# Patient Record
Sex: Female | Born: 1955 | Race: Black or African American | Hispanic: No | Smoking: Former smoker
Health system: Southern US, Community
[De-identification: ages and names within clinical notes are randomized; demographics above are authoritative.]

## PROBLEM LIST (undated history)

## (undated) DIAGNOSIS — E119 Type 2 diabetes mellitus without complications: Secondary | ICD-10-CM

## (undated) DIAGNOSIS — I420 Dilated cardiomyopathy: Secondary | ICD-10-CM

## (undated) DIAGNOSIS — H3552 Pigmentary retinal dystrophy: Secondary | ICD-10-CM

## (undated) DIAGNOSIS — E782 Mixed hyperlipidemia: Secondary | ICD-10-CM

## (undated) DIAGNOSIS — R51 Headache: Secondary | ICD-10-CM

## (undated) DIAGNOSIS — Z171 Estrogen receptor negative status [ER-]: Principal | ICD-10-CM

## (undated) DIAGNOSIS — J45909 Unspecified asthma, uncomplicated: Secondary | ICD-10-CM

## (undated) DIAGNOSIS — K8020 Calculus of gallbladder without cholecystitis without obstruction: Secondary | ICD-10-CM

## (undated) DIAGNOSIS — H209 Unspecified iridocyclitis: Secondary | ICD-10-CM

## (undated) DIAGNOSIS — Z9221 Personal history of antineoplastic chemotherapy: Secondary | ICD-10-CM

## (undated) DIAGNOSIS — R911 Solitary pulmonary nodule: Secondary | ICD-10-CM

## (undated) DIAGNOSIS — C50512 Malignant neoplasm of lower-outer quadrant of left female breast: Secondary | ICD-10-CM

## (undated) DIAGNOSIS — R519 Headache, unspecified: Secondary | ICD-10-CM

## (undated) DIAGNOSIS — H547 Unspecified visual loss: Secondary | ICD-10-CM

## (undated) DIAGNOSIS — I10 Essential (primary) hypertension: Secondary | ICD-10-CM

## (undated) DIAGNOSIS — I509 Heart failure, unspecified: Secondary | ICD-10-CM

## (undated) DIAGNOSIS — Z923 Personal history of irradiation: Secondary | ICD-10-CM

## (undated) DIAGNOSIS — I2510 Atherosclerotic heart disease of native coronary artery without angina pectoris: Secondary | ICD-10-CM

## (undated) DIAGNOSIS — D649 Anemia, unspecified: Secondary | ICD-10-CM

## (undated) HISTORY — DX: Anemia, unspecified: D64.9

## (undated) HISTORY — DX: Unspecified asthma, uncomplicated: J45.909

## (undated) HISTORY — PX: TRANSTHORACIC ECHOCARDIOGRAM: SHX275

## (undated) HISTORY — DX: Calculus of gallbladder without cholecystitis without obstruction: K80.20

## (undated) HISTORY — DX: Atherosclerotic heart disease of native coronary artery without angina pectoris: I25.10

## (undated) HISTORY — PX: NASAL TURBINATE REDUCTION: SHX2072

## (undated) HISTORY — DX: Malignant neoplasm of lower-outer quadrant of left female breast: C50.512

## (undated) HISTORY — DX: Heart failure, unspecified: I50.9

## (undated) HISTORY — DX: Mixed hyperlipidemia: E78.2

## (undated) HISTORY — DX: Estrogen receptor negative status (ER-): Z17.1

## (undated) HISTORY — DX: Solitary pulmonary nodule: R91.1

## (undated) HISTORY — DX: Dilated cardiomyopathy: I42.0

## (undated) MED FILL — Dexamethasone Sodium Phosphate Inj 100 MG/10ML: INTRAMUSCULAR | Qty: 1 | Status: AC

---

## 2005-08-12 HISTORY — PX: OTHER SURGICAL HISTORY: SHX169

## 2015-07-03 ENCOUNTER — Emergency Department (HOSPITAL_COMMUNITY)
Admission: EM | Admit: 2015-07-03 | Discharge: 2015-07-03 | Disposition: A | Discharge status: Home / Self Care | Payer: Medicare Other | Attending: Emergency Medicine | Admitting: Emergency Medicine

## 2015-07-03 ENCOUNTER — Encounter (HOSPITAL_COMMUNITY): Payer: Self-pay | Admitting: *Deleted

## 2015-07-03 DIAGNOSIS — Z79899 Other long term (current) drug therapy: Secondary | ICD-10-CM | POA: Insufficient documentation

## 2015-07-03 DIAGNOSIS — H540 Blindness, both eyes: Secondary | ICD-10-CM | POA: Diagnosis not present

## 2015-07-03 DIAGNOSIS — H2013 Chronic iridocyclitis, bilateral: Secondary | ICD-10-CM | POA: Insufficient documentation

## 2015-07-03 DIAGNOSIS — Z7982 Long term (current) use of aspirin: Secondary | ICD-10-CM | POA: Insufficient documentation

## 2015-07-03 DIAGNOSIS — I10 Essential (primary) hypertension: Secondary | ICD-10-CM | POA: Diagnosis not present

## 2015-07-03 DIAGNOSIS — Z791 Long term (current) use of non-steroidal anti-inflammatories (NSAID): Secondary | ICD-10-CM | POA: Insufficient documentation

## 2015-07-03 DIAGNOSIS — H578 Other specified disorders of eye and adnexa: Secondary | ICD-10-CM | POA: Diagnosis present

## 2015-07-03 HISTORY — DX: Essential (primary) hypertension: I10

## 2015-07-03 HISTORY — DX: Unspecified visual loss: H54.7

## 2015-07-03 HISTORY — DX: Unspecified iridocyclitis: H20.9

## 2015-07-03 HISTORY — DX: Pigmentary retinal dystrophy: H35.52

## 2015-07-03 MED ORDER — TETRACAINE HCL 0.5 % OP SOLN
2.0000 [drp] | Freq: Once | OPHTHALMIC | Status: AC
  Administered 2015-07-03: 2 [drp] via OPHTHALMIC
  Filled 2015-07-03: qty 2

## 2015-07-03 MED ORDER — FLUORESCEIN SODIUM 1 MG OP STRP
1.0000 | ORAL_STRIP | Freq: Once | OPHTHALMIC | Status: AC
  Administered 2015-07-03: 10:00:00 via OPHTHALMIC
  Filled 2015-07-03: qty 1

## 2015-07-03 NOTE — ED Provider Notes (Signed)
CSN: [id number]     Arrival date & time 07/03/15  [id number] History   First MD Initiated Contact with Patient 07/03/15 [phone]     Chief Complaint  Patient presents with  . Eye Problem     (Consider location/radiation/quality/duration/timing/severity/associated sxs/prior Treatment) Patient is a 59 y.o. female presenting with eye problem. The history is provided by the patient.  Eye Problem Location:  Both Quality:  Burning Severity:  Moderate Onset quality:  Gradual Duration: 1.5 weeks. Timing:  Constant Progression:  Unchanged Chronicity:  Recurrent Context: not contact lens problem, not foreign body and not scratch   Relieved by:  Nothing Worsened by:  Nothing tried Ineffective treatments:  None tried Associated symptoms: redness   Associated symptoms: no photophobia   Risk factors comment:  RP and history of uveitis   Past Medical History  Diagnosis Date  . Blind   . Retinitis pigmentosa   . Uveitis   . Hypertension    History reviewed. No pertinent past surgical history. History reviewed. No pertinent family history. Social History  Substance Use Topics  . Smoking status: Never Smoker   . Smokeless tobacco: None  . Alcohol Use: No   OB History    No data available     Review of Systems  Eyes: Positive for redness. Negative for photophobia.  All other systems reviewed and are negative.     Allergies  Tylenol with codeine #3  Home Medications   Prior to Admission medications   Medication Sig Start Date End Date Taking? Authorizing Provider  albuterol (PROVENTIL HFA;VENTOLIN HFA) 108 (90 BASE) MCG/ACT inhaler Inhale 1 puff into the lungs every 6 (six) hours as needed for wheezing or shortness of breath.   Yes Historical Provider, MD  aspirin EC 81 MG tablet Take 81 mg by mouth daily.   Yes Historical Provider, MD  ferrous sulfate 325 (65 FE) MG tablet Take 325 mg by mouth daily with breakfast.   Yes Historical Provider, MD  ibuprofen (ADVIL,MOTRIN) 200 MG  tablet Take 400 mg by mouth every 6 (six) hours as needed for moderate pain.   Yes Historical Provider, MD  naproxen (NAPROSYN) 375 MG tablet Take 375 mg by mouth 2 (two) times daily with a meal.   Yes Historical Provider, MD   BP 171/104 mmHg  Pulse 64  Temp(Src) 97.9 F (36.6 C) (Oral)  Resp 18  SpO2 98% Physical Exam  Constitutional: She is oriented to person, place, and time. She appears well-developed and well-nourished. No distress.  HENT:  Head: Normocephalic.  Eyes: Lids are normal. Pupils are equal, round, and reactive to light. Right eye exhibits no chemosis and no exudate. Right conjunctiva is injected (right worse than left). Left conjunctiva is injected.  Strabismus, left corneal clouding worse than right without fluorescein uptake in either side.   Neck: Neck supple. No tracheal deviation present.  Cardiovascular: Normal rate and regular rhythm.   Pulmonary/Chest: Effort normal. No respiratory distress.  Abdominal: Soft. She exhibits no distension.  Neurological: She is alert and oriented to person, place, and time.  Skin: Skin is warm and dry.  Psychiatric: She has a normal mood and affect.    ED Course  Procedures (including critical care time) Labs Review Labs Reviewed - No data to display  Imaging Review No results found. I have personally reviewed and evaluated these images and lab results as part of my medical decision-making.   EKG Interpretation None      MDM   Final diagnoses:  Chronic  anterior uveitis, bilateral    59 y.o. 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 No evidence of corneal abrasion. Has acute findings here that will require urgent exam by opthalmology. She is pending appointment with retina and eye center but has not gotten medicaid approval yet. I called the o/c MD for their office and they can see Pt for $100 deposit but Pt  unable to afford, recommending consult to ED o/c ophtho. ophtho consulted and can see Pt in clinic today. Pt able to go directly to their office and discharged with plan to se them or return if unable.    Leo Grosser, MD 07/05/15 [phone]

## 2015-07-03 NOTE — ED Notes (Signed)
Legally blind  Hx of uv itis-- retinitis pigmentosa x 8 yrs

## 2015-07-03 NOTE — ED Notes (Signed)
Gave pt water, per Santiago Glad - RN

## 2015-07-03 NOTE — Discharge Instructions (Signed)
Uveitis Uveitis is the swelling and irritation in the eye. Most of the time, it affects the middle part of the eye (uvea). There are different types uveitis:  Iritis. This type affects the colored part of the eye.  Intermediate uveitis. This type affects the middle of the eye.  Posterior uveitis. This type affects the back of the eye.  Panuveitis. This type affects all layers of the eye. HOME CARE  Take medicines only as told by your doctor. These include over-the-counter medicines and prescription medicines.  Follow instructions from your doctor about what activities are safe for you.  Do not use any tobacco products. These include cigarettes, chewing tobacco, and e-cigarettes. If you need help quitting, ask your doctor.  Keep all follow-up visits as told by your doctor. This is important. GET HELP IF:  Your medicines are not working. GET HELP RIGHT AWAY IF:  You have redness in one eye or both eyes.  Light bothers your eyes a lot.  You have pain in your eye.  You have aching in your eye.  You cannot see as much as you did before.   This information is not intended to replace advice given to you by your health care provider. Make sure you discuss any questions you have with your health care provider.   Document Released: 12/13/2014 Document Reviewed: 12/13/2014 Elsevier Interactive Patient Education [employer].

## 2015-07-03 NOTE — ED Notes (Signed)
Pt reports having uveititis and retinitis which has caused blindness to both eyes. Now has redness to right eye and pain/itching to both eyes.

## 2015-10-09 ENCOUNTER — Encounter (HOSPITAL_COMMUNITY): Payer: Self-pay | Admitting: Emergency Medicine

## 2015-10-09 ENCOUNTER — Emergency Department (HOSPITAL_COMMUNITY): Payer: Medicare Other

## 2015-10-09 ENCOUNTER — Emergency Department (HOSPITAL_COMMUNITY)
Admission: EM | Admit: 2015-10-09 | Discharge: 2015-10-09 | Disposition: A | Discharge status: Home / Self Care | Payer: Medicare Other | Attending: Emergency Medicine | Admitting: Emergency Medicine

## 2015-10-09 DIAGNOSIS — Z79899 Other long term (current) drug therapy: Secondary | ICD-10-CM | POA: Diagnosis not present

## 2015-10-09 DIAGNOSIS — R05 Cough: Secondary | ICD-10-CM

## 2015-10-09 DIAGNOSIS — R062 Wheezing: Secondary | ICD-10-CM

## 2015-10-09 DIAGNOSIS — J159 Unspecified bacterial pneumonia: Secondary | ICD-10-CM | POA: Diagnosis not present

## 2015-10-09 DIAGNOSIS — H540 Blindness, both eyes: Secondary | ICD-10-CM | POA: Diagnosis not present

## 2015-10-09 DIAGNOSIS — I10 Essential (primary) hypertension: Secondary | ICD-10-CM | POA: Diagnosis not present

## 2015-10-09 DIAGNOSIS — R059 Cough, unspecified: Secondary | ICD-10-CM

## 2015-10-09 DIAGNOSIS — Z791 Long term (current) use of non-steroidal anti-inflammatories (NSAID): Secondary | ICD-10-CM | POA: Diagnosis not present

## 2015-10-09 DIAGNOSIS — Z7982 Long term (current) use of aspirin: Secondary | ICD-10-CM | POA: Diagnosis not present

## 2015-10-09 DIAGNOSIS — J189 Pneumonia, unspecified organism: Secondary | ICD-10-CM

## 2015-10-09 MED ORDER — HYDROCHLOROTHIAZIDE 12.5 MG PO TABS: 12.5000 mg | ORAL_TABLET | Freq: Every day | ORAL | Status: DC

## 2015-10-09 MED ORDER — ALBUTEROL SULFATE HFA 108 (90 BASE) MCG/ACT IN AERS: 2.0000 | INHALATION_SPRAY | RESPIRATORY_TRACT | Status: DC | PRN

## 2015-10-09 MED ORDER — ALBUTEROL SULFATE (2.5 MG/3ML) 0.083% IN NEBU
5.0000 mg | INHALATION_SOLUTION | Freq: Once | RESPIRATORY_TRACT | Status: AC
  Administered 2015-10-09: 5 mg via RESPIRATORY_TRACT
  Filled 2015-10-09: qty 6

## 2015-10-09 MED ORDER — AMLODIPINE BESYLATE 5 MG PO TABS
5.0000 mg | ORAL_TABLET | Freq: Once | ORAL | Status: AC
  Administered 2015-10-09: 5 mg via ORAL
  Filled 2015-10-09: qty 1

## 2015-10-09 MED ORDER — DM-GUAIFENESIN ER 30-600 MG PO TB12
1.0000 | ORAL_TABLET | Freq: Two times a day (BID) | ORAL | Status: DC
  Administered 2015-10-09: 1 via ORAL
  Filled 2015-10-09: qty 1

## 2015-10-09 MED ORDER — DOXYCYCLINE HYCLATE 100 MG PO CAPS: 100.0000 mg | ORAL_CAPSULE | Freq: Two times a day (BID) | ORAL | Status: DC

## 2015-10-09 MED ORDER — AMLODIPINE BESYLATE 5 MG PO TABS: 5.0000 mg | ORAL_TABLET | Freq: Every day | ORAL | Status: DC

## 2015-10-09 MED ORDER — DOXYCYCLINE HYCLATE 100 MG PO TABS
100.0000 mg | ORAL_TABLET | Freq: Once | ORAL | Status: AC
  Administered 2015-10-09: 100 mg via ORAL
  Filled 2015-10-09: qty 1

## 2015-10-09 MED ORDER — IPRATROPIUM BROMIDE 0.02 % IN SOLN
0.5000 mg | Freq: Once | RESPIRATORY_TRACT | Status: AC
  Administered 2015-10-09: 0.5 mg via RESPIRATORY_TRACT
  Filled 2015-10-09: qty 2.5

## 2015-10-09 NOTE — ED Notes (Signed)
Patient left at this time with all belongings. 

## 2015-10-09 NOTE — ED Notes (Signed)
Pt sts cough and sore throat x 1 week; pt denies fever; pt sts some nosebleeding; pt sts some HA

## 2015-10-09 NOTE — ED Provider Notes (Addendum)
CSN: [id number]     Arrival date & time 10/09/15  [id number] History   First MD Initiated Contact with Patient 10/09/15 1909     Chief Complaint  Patient presents with  . Cough     (Consider location/radiation/quality/duration/timing/severity/associated sxs/prior Treatment) Patient is a 60 y.o. female presenting with cough. The history is provided by the patient.  Cough Associated symptoms: shortness of breath   Associated symptoms: no chest pain, no chills, no headaches, no rash and no sore throat   Patient c/o increased productive cough in the past 1-2 weeks. States stopped smoking 2 weeks ago.  Denies sore throat or runny nose.  States sob w coughing spells. subj fever. No chills. Denies chest pain or discomfort.  No leg pain or swelling. No recent surgery, immobility, trauma or travel.  No hx dvt or pe. No exertional cp or discomfort. No unusual fatigue. No fam hx premature cad. Denies orthopnea or pnd. No wt change.     Past Medical History  Diagnosis Date  . Blind   . Retinitis pigmentosa   . Uveitis   . Hypertension    History reviewed. No pertinent past surgical history. History reviewed. No pertinent family history. Social History  Substance Use Topics  . Smoking status: Never Smoker   . Smokeless tobacco: None  . Alcohol Use: No   OB History    No data available     Review of Systems  Constitutional: Negative for chills.  HENT: Negative for sore throat.   Eyes: Negative for redness.  Respiratory: Positive for cough and shortness of breath.   Cardiovascular: Negative for chest pain and leg swelling.  Gastrointestinal: Negative for vomiting, abdominal pain and diarrhea.  Genitourinary: Negative for dysuria and flank pain.  Musculoskeletal: Negative for back pain and neck pain.  Skin: Negative for rash.  Neurological: Negative for headaches.  Hematological: Does not bruise/bleed easily.  Psychiatric/Behavioral: Negative for confusion.      Allergies  Tylenol  with codeine #3  Home Medications   Prior to Admission medications   Medication Sig Start Date End Date Taking? Authorizing Provider  albuterol (PROVENTIL HFA;VENTOLIN HFA) 108 (90 BASE) MCG/ACT inhaler Inhale 1 puff into the lungs every 6 (six) hours as needed for wheezing or shortness of breath.    Historical Provider, MD  aspirin EC 81 MG tablet Take 81 mg by mouth daily.    Historical Provider, MD  ferrous sulfate 325 (65 FE) MG tablet Take 325 mg by mouth daily with breakfast.    Historical Provider, MD  ibuprofen (ADVIL,MOTRIN) 200 MG tablet Take 400 mg by mouth every 6 (six) hours as needed for moderate pain.    Historical Provider, MD  naproxen (NAPROSYN) 375 MG tablet Take 375 mg by mouth 2 (two) times daily with a meal.    Historical Provider, MD   BP 154/116 mmHg  Pulse 101  Temp(Src) 97.8 F (36.6 C) (Oral)  Resp 19  SpO2 98% Physical Exam  Constitutional: She is oriented to person, place, and time. She appears well-developed and well-nourished. No distress.  HENT:  Mouth/Throat: Oropharynx is clear and moist.  Eyes: Conjunctivae are normal. No scleral icterus.  Neck: Normal range of motion. Neck supple. No tracheal deviation present. No thyromegaly present.  Cardiovascular: Normal rate, regular rhythm, normal heart sounds and intact distal pulses.  Exam reveals no gallop and no friction rub.   No murmur heard. Pulmonary/Chest: Effort normal. No respiratory distress. She has wheezes.  Abdominal: Normal appearance and bowel sounds  are normal. She exhibits no distension. There is no tenderness.  Musculoskeletal: She exhibits no edema or tenderness.  Lymphadenopathy:    She has no cervical adenopathy.  Neurological: She is alert and oriented to person, place, and time.  Skin: Skin is warm and dry. No rash noted. She is not diaphoretic.  Psychiatric: She has a normal mood and affect.  Nursing note and vitals reviewed.   ED Course  Procedures (including critical care  time)  Imaging Review Dg Chest 2 View  10/09/2015  CLINICAL DATA:  One week history of cough. EXAM: CHEST  2 VIEW COMPARISON:  None. FINDINGS: The heart is enlarged. Mild tortuosity of the thoracic aorta. There are bronchitic type interstitial lung changes and peribronchial thickening which could suggest bronchitis or interstitial pneumonitis. No focal airspace consolidation or pleural effusion. The bony thorax is intact. IMPRESSION: Diffuse bronchitic changes suggesting bronchitis or interstitial pneumonitis. No focal infiltrate or effusion. Electronically Signed   By: Marijo Sanes M.D.   On: 10/09/2015 16:21   I have personally reviewed and evaluated these images as part of my medical decision-making.    MDM   Cxr.  Reviewed nursing notes and prior charts for additional history.   V mild wheezing, resolved w neb.   Productive cough, congestion on exam.  Will rx for possible cap.   Doxycycline po.  rx for home.  rec close pcp f/u re current symptoms, as well as for recheck bp.  On review of prior records, prior bp also high, will start rx tonight.        Lajean Saver, MD 10/09/15 2020

## 2015-10-09 NOTE — Discharge Instructions (Signed)
It was our pleasure to provide your ER care today - we hope that you feel better.  Great work on stopping smoking - keep it up.  Take doxycycline (antibiotic) as prescribed.  Use albuterol inhaler as need.  Take mucinex as need for cough (available over the counter).   Follow up with primary care doctor in the next 3-4 days for recheck.  Also have your blood pressure rechecked then as it is high today.   For blood pressure, take amlodipine and hctz as prescribed.   Return to ER if worse, increased difficulty breathing, chest pain, weak/fainting, other concern.       Cough, Adult Coughing is a reflex that clears your throat and your airways. Coughing helps to heal and protect your lungs. It is normal to cough occasionally, but a cough that happens with other symptoms or lasts a long time may be a sign of a condition that needs treatment. A cough may last only 2-3 weeks (acute), or it may last longer than 8 weeks (chronic). CAUSES Coughing is commonly caused by:  Breathing in substances that irritate your lungs.  A viral or bacterial respiratory infection.  Allergies.  Asthma.  Postnasal drip.  Smoking.  Acid backing up from the stomach into the esophagus (gastroesophageal reflux).  Certain medicines.  Chronic lung problems, including COPD (or rarely, lung cancer).  Other medical conditions such as heart failure. HOME CARE INSTRUCTIONS  Pay attention to any changes in your symptoms. Take these actions to help with your discomfort:  Take medicines only as told by your health care provider.  If you were prescribed an antibiotic medicine, take it as told by your health care provider. Do not stop taking the antibiotic even if you start to feel better.  Talk with your health care provider before you take a cough suppressant medicine.  Drink enough fluid to keep your urine clear or pale yellow.  If the air is dry, use a cold steam vaporizer or humidifier in your  bedroom or your home to help loosen secretions.  Avoid anything that causes you to cough at work or at home.  If your cough is worse at night, try sleeping in a semi-upright position.  Avoid cigarette smoke. If you smoke, quit smoking. If you need help quitting, ask your health care provider.  Avoid caffeine.  Avoid alcohol.  Rest as needed. SEEK MEDICAL CARE IF:   You have new symptoms.  You cough up pus.  Your cough does not get better after 2-3 weeks, or your cough gets worse.  You cannot control your cough with suppressant medicines and you are losing sleep.  You develop pain that is getting worse or pain that is not controlled with pain medicines.  You have a fever.  You have unexplained weight loss.  You have night sweats. SEEK IMMEDIATE MEDICAL CARE IF:  You cough up blood.  You have difficulty breathing.  Your heartbeat is very fast.   This information is not intended to replace advice given to you by your health care provider. Make sure you discuss any questions you have with your health care provider.   Document Released: 01/25/2011 Document Revised: 04/19/2015 Document Reviewed: 10/05/2014 Elsevier Interactive Patient Education 2016 [employer].  Hypertension Hypertension, commonly called high blood pressure, is when the force of blood pumping through your arteries is too strong. Your arteries are the blood vessels that carry blood from your heart throughout your body. A blood pressure reading consists of a higher number over  a lower number, such as 110/72. The higher number (systolic) is the pressure inside your arteries when your heart pumps. The lower number (diastolic) is the pressure inside your arteries when your heart relaxes. Ideally you want your blood pressure below 120/80. Hypertension forces your heart to work harder to pump blood. Your arteries may become narrow or stiff. Having untreated or uncontrolled hypertension can cause heart attack,  stroke, kidney disease, and other problems. RISK FACTORS Some risk factors for high blood pressure are controllable. Others are not.  Risk factors you cannot control include:   Race. You may be at higher risk if you are African American.  Age. Risk increases with age.  Gender. Men are at higher risk than women before age 10 years. After age 102, women are at higher risk than men. Risk factors you can control include:  Not getting enough exercise or physical activity.  Being overweight.  Getting too much fat, sugar, calories, or salt in your diet.  Drinking too much alcohol. SIGNS AND SYMPTOMS Hypertension does not usually cause signs or symptoms. Extremely high blood pressure (hypertensive crisis) may cause headache, anxiety, shortness of breath, and nosebleed. DIAGNOSIS To check if you have hypertension, your health care provider will measure your blood pressure while you are seated, with your arm held at the level of your heart. It should be measured at least twice using the same arm. Certain conditions can cause a difference in blood pressure between your right and left arms. A blood pressure reading that is higher than normal on one occasion does not mean that you need treatment. If it is not clear whether you have high blood pressure, you may be asked to return on a different day to have your blood pressure checked again. Or, you may be asked to monitor your blood pressure at home for 1 or more weeks. TREATMENT Treating high blood pressure includes making lifestyle changes and possibly taking medicine. Living a healthy lifestyle can help lower high blood pressure. You may need to change some of your habits. Lifestyle changes may include:  Following the DASH diet. This diet is high in fruits, vegetables, and whole grains. It is low in salt, red meat, and added sugars.  Keep your sodium intake below 2,300 mg per day.  Getting at least 30-45 minutes of aerobic exercise at least 4  times per week.  Losing weight if necessary.  Not smoking.  Limiting alcoholic beverages.  Learning ways to reduce stress. Your health care provider may prescribe medicine if lifestyle changes are not enough to get your blood pressure under control, and if one of the following is true:  You are 28-19 years of age and your systolic blood pressure is above 140.  You are 37 years of age or older, and your systolic blood pressure is above 150.  Your diastolic blood pressure is above 90.  You have diabetes, and your systolic blood pressure is over [id number] or your diastolic blood pressure is over 90.  You have kidney disease and your blood pressure is above 140/90.  You have heart disease and your blood pressure is above 140/90. Your personal target blood pressure may vary depending on your medical conditions, your age, and other factors. HOME CARE INSTRUCTIONS  Have your blood pressure rechecked as directed by your health care provider.   Take medicines only as directed by your health care provider. Follow the directions carefully. Blood pressure medicines must be taken as prescribed. The medicine does not work as well  when you skip doses. Skipping doses also puts you at risk for problems.  Do not smoke.   Monitor your blood pressure at home as directed by your health care provider. SEEK MEDICAL CARE IF:   You think you are having a reaction to medicines taken.  You have recurrent headaches or feel dizzy.  You have swelling in your ankles.  You have trouble with your vision. SEEK IMMEDIATE MEDICAL CARE IF:  You develop a severe headache or confusion.  You have unusual weakness, numbness, or feel faint.  You have severe chest or abdominal pain.  You vomit repeatedly.  You have trouble breathing. MAKE SURE YOU:   Understand these instructions.  Will watch your condition.  Will get help right away if you are not doing well or get worse.   This information is not  intended to replace advice given to you by your health care provider. Make sure you discuss any questions you have with your health care provider.   Document Released: 07/29/2005 Document Revised: 12/13/2014 Document Reviewed: 05/21/2013 Elsevier Interactive Patient Education [employer].

## 2015-12-11 DIAGNOSIS — I420 Dilated cardiomyopathy: Secondary | ICD-10-CM

## 2015-12-11 HISTORY — PX: TRANSTHORACIC ECHOCARDIOGRAM: SHX275

## 2015-12-11 HISTORY — DX: Dilated cardiomyopathy: I42.0

## 2015-12-27 DIAGNOSIS — I420 Dilated cardiomyopathy: Secondary | ICD-10-CM | POA: Insufficient documentation

## 2015-12-28 DIAGNOSIS — I5042 Chronic combined systolic (congestive) and diastolic (congestive) heart failure: Secondary | ICD-10-CM | POA: Insufficient documentation

## 2016-01-04 LAB — HM HIV SCREENING LAB: HM HIV Screening: NEGATIVE

## 2016-01-11 DIAGNOSIS — R112 Nausea with vomiting, unspecified: Secondary | ICD-10-CM | POA: Diagnosis not present

## 2016-01-11 DIAGNOSIS — I10 Essential (primary) hypertension: Secondary | ICD-10-CM | POA: Diagnosis not present

## 2016-01-11 DIAGNOSIS — I5031 Acute diastolic (congestive) heart failure: Secondary | ICD-10-CM | POA: Diagnosis not present

## 2016-01-25 DIAGNOSIS — I10 Essential (primary) hypertension: Secondary | ICD-10-CM | POA: Diagnosis not present

## 2016-01-25 DIAGNOSIS — I509 Heart failure, unspecified: Secondary | ICD-10-CM | POA: Diagnosis not present

## 2016-02-14 DIAGNOSIS — I509 Heart failure, unspecified: Secondary | ICD-10-CM | POA: Diagnosis not present

## 2016-02-14 DIAGNOSIS — I10 Essential (primary) hypertension: Secondary | ICD-10-CM | POA: Diagnosis not present

## 2016-02-19 ENCOUNTER — Other Ambulatory Visit: Payer: Self-pay | Admitting: Family Medicine

## 2016-02-19 DIAGNOSIS — Z1231 Encounter for screening mammogram for malignant neoplasm of breast: Secondary | ICD-10-CM

## 2016-02-22 ENCOUNTER — Ambulatory Visit
Admission: RE | Admit: 2016-02-22 | Discharge: 2016-02-22 | Disposition: A | Discharge status: Home / Self Care | Payer: Commercial Managed Care - HMO | Source: Ambulatory Visit | Attending: Family Medicine | Admitting: Family Medicine

## 2016-02-22 DIAGNOSIS — Z1231 Encounter for screening mammogram for malignant neoplasm of breast: Secondary | ICD-10-CM

## 2016-02-26 ENCOUNTER — Other Ambulatory Visit: Payer: Self-pay | Admitting: Family Medicine

## 2016-02-26 DIAGNOSIS — R928 Other abnormal and inconclusive findings on diagnostic imaging of breast: Secondary | ICD-10-CM

## 2016-03-01 ENCOUNTER — Other Ambulatory Visit: Payer: Self-pay

## 2016-03-01 ENCOUNTER — Ambulatory Visit
Admission: RE | Admit: 2016-03-01 | Discharge: 2016-03-01 | Disposition: A | Discharge status: Home / Self Care | Payer: Commercial Managed Care - HMO | Source: Ambulatory Visit | Attending: Family Medicine | Admitting: Family Medicine

## 2016-03-01 DIAGNOSIS — R928 Other abnormal and inconclusive findings on diagnostic imaging of breast: Secondary | ICD-10-CM

## 2016-03-15 DIAGNOSIS — R911 Solitary pulmonary nodule: Secondary | ICD-10-CM

## 2016-03-15 DIAGNOSIS — R9439 Abnormal result of other cardiovascular function study: Secondary | ICD-10-CM | POA: Insufficient documentation

## 2016-03-15 HISTORY — DX: Solitary pulmonary nodule: R91.1

## 2016-03-16 DIAGNOSIS — R911 Solitary pulmonary nodule: Secondary | ICD-10-CM | POA: Insufficient documentation

## 2016-03-16 LAB — HM HEPATITIS C SCREENING LAB: HM Hepatitis Screen: NEGATIVE

## 2016-08-27 DIAGNOSIS — I10 Essential (primary) hypertension: Secondary | ICD-10-CM | POA: Diagnosis not present

## 2016-08-27 DIAGNOSIS — J209 Acute bronchitis, unspecified: Secondary | ICD-10-CM | POA: Diagnosis not present

## 2016-08-27 DIAGNOSIS — I509 Heart failure, unspecified: Secondary | ICD-10-CM | POA: Diagnosis not present

## 2016-10-28 DIAGNOSIS — M79644 Pain in right finger(s): Secondary | ICD-10-CM | POA: Diagnosis not present

## 2016-10-28 DIAGNOSIS — I509 Heart failure, unspecified: Secondary | ICD-10-CM | POA: Diagnosis not present

## 2016-10-28 DIAGNOSIS — I10 Essential (primary) hypertension: Secondary | ICD-10-CM | POA: Diagnosis not present

## 2016-11-19 DIAGNOSIS — Z9181 History of falling: Secondary | ICD-10-CM | POA: Diagnosis not present

## 2016-11-19 DIAGNOSIS — I10 Essential (primary) hypertension: Secondary | ICD-10-CM | POA: Diagnosis not present

## 2016-11-19 DIAGNOSIS — E669 Obesity, unspecified: Secondary | ICD-10-CM | POA: Diagnosis not present

## 2016-11-19 DIAGNOSIS — Z0000 Encounter for general adult medical examination without abnormal findings: Secondary | ICD-10-CM | POA: Diagnosis not present

## 2016-11-19 DIAGNOSIS — Z6837 Body mass index (BMI) 37.0-37.9, adult: Secondary | ICD-10-CM | POA: Diagnosis not present

## 2016-11-19 DIAGNOSIS — R110 Nausea: Secondary | ICD-10-CM | POA: Diagnosis not present

## 2016-11-19 DIAGNOSIS — Z79899 Other long term (current) drug therapy: Secondary | ICD-10-CM | POA: Diagnosis not present

## 2016-11-19 DIAGNOSIS — R600 Localized edema: Secondary | ICD-10-CM | POA: Diagnosis not present

## 2016-11-19 DIAGNOSIS — Z87891 Personal history of nicotine dependence: Secondary | ICD-10-CM | POA: Diagnosis not present

## 2017-03-20 ENCOUNTER — Encounter: Payer: Self-pay | Admitting: Gastroenterology

## 2017-03-20 ENCOUNTER — Encounter: Payer: Self-pay | Admitting: Family Medicine

## 2017-03-20 ENCOUNTER — Ambulatory Visit (INDEPENDENT_AMBULATORY_CARE_PROVIDER_SITE_OTHER): Payer: Medicare HMO | Admitting: Family Medicine

## 2017-03-20 ENCOUNTER — Other Ambulatory Visit: Payer: Self-pay | Admitting: Family Medicine

## 2017-03-20 VITALS — BP 132/90 | HR 75 | Ht 67.25 in | Wt 237.6 lb

## 2017-03-20 DIAGNOSIS — H3552 Pigmentary retinal dystrophy: Secondary | ICD-10-CM

## 2017-03-20 DIAGNOSIS — I10 Essential (primary) hypertension: Secondary | ICD-10-CM

## 2017-03-20 DIAGNOSIS — Z136 Encounter for screening for cardiovascular disorders: Secondary | ICD-10-CM | POA: Diagnosis not present

## 2017-03-20 DIAGNOSIS — I509 Heart failure, unspecified: Secondary | ICD-10-CM | POA: Diagnosis not present

## 2017-03-20 DIAGNOSIS — Z800 Family history of malignant neoplasm of digestive organs: Secondary | ICD-10-CM | POA: Diagnosis not present

## 2017-03-20 DIAGNOSIS — Z1211 Encounter for screening for malignant neoplasm of colon: Secondary | ICD-10-CM | POA: Diagnosis not present

## 2017-03-20 DIAGNOSIS — Z7689 Persons encountering health services in other specified circumstances: Secondary | ICD-10-CM | POA: Diagnosis not present

## 2017-03-20 DIAGNOSIS — H547 Unspecified visual loss: Secondary | ICD-10-CM

## 2017-03-20 LAB — COMPLETE METABOLIC PANEL WITH GFR
ALBUMIN: 4.3 g/dL (ref 3.6–5.1)
ALT: 40 U/L — AB (ref 6–29)
AST: 24 U/L (ref 10–35)
Alkaline Phosphatase: 109 U/L (ref 33–130)
BUN: 13 mg/dL (ref 7–25)
CHLORIDE: 108 mmol/L (ref 98–110)
CO2: 22 mmol/L (ref 20–32)
CREATININE: 0.78 mg/dL (ref 0.50–1.05)
Calcium: 9.4 mg/dL (ref 8.6–10.4)
GFR, Est African American: >89 mL/min (ref >=60)
GFR, Est Non African American: 84 mL/min (ref >=60)
GLUCOSE: 99 mg/dL (ref 65–99)
POTASSIUM: 4.2 mmol/L (ref 3.5–5.3)
SODIUM: 141 mmol/L (ref 135–146)
Total Bilirubin: 0.4 mg/dL (ref 0.2–1.2)
Total Protein: 7.3 g/dL (ref 6.1–8.1)

## 2017-03-20 LAB — CBC WITH DIFFERENTIAL/PLATELET
BASOS PCT: 0 %
Basophils Absolute: 0 cells/uL (ref 0–200)
EOS PCT: 3 %
Eosinophils Absolute: 225 cells/uL (ref 15–500)
HCT: 37.9 % (ref 35.0–45.0)
HEMOGLOBIN: 12.7 g/dL (ref 11.7–15.5)
LYMPHS ABS: 2400 {cells}/uL (ref 850–3900)
Lymphocytes Relative: 32 %
MCH: 30 pg (ref 27.0–33.0)
MCHC: 33.5 g/dL (ref 32.0–36.0)
MCV: 89.6 fL (ref 80.0–100.0)
MPV: 9.5 fL (ref 7.5–12.5)
Monocytes Absolute: 450 cells/uL (ref 200–950)
Monocytes Relative: 6 %
NEUTROS PCT: 59 %
Neutro Abs: 4425 cells/uL (ref 1500–7800)
Platelets: 362 10*3/uL (ref 140–400)
RBC: 4.23 MIL/uL (ref 3.80–5.10)
RDW: 14 % (ref 11.0–15.0)
WBC: 7.5 10*3/uL (ref 4.0–10.5)

## 2017-03-20 LAB — LIPID PANEL
Cholesterol: 195 mg/dL (ref <200)
HDL: 46 mg/dL — AB (ref >50)
LDL CALC: 128 mg/dL — AB (ref <100)
TRIGLYCERIDES: 104 mg/dL (ref <150)
Total CHOL/HDL Ratio: 4.2 Ratio (ref <5.0)
VLDL: 21 mg/dL (ref <30)

## 2017-03-20 NOTE — Patient Instructions (Signed)
I need your medical records then we can get you back for an medicare annual wellness visit. Please call us in 2-4 weeks and see if we have received the records.   We will call you with lab results.   The cardiologist office and GI offices will call you to schedule an appointment.   The retina specialist office has scheduled an appointment for you. Please call them if you need to reschedule the appointment.

## 2017-03-20 NOTE — Progress Notes (Signed)
Subjective:    Patient ID: Kristin Ward, female    DOB: 04-Nov-1955, 61 y.o.   MRN: [id number]  HPI Chief Complaint  Patient presents with  . new pt    new pt fasting cpe. no vision exam done- pt had vision problems seeing, pap smear done 5 years ago   She is new to the practice and here today to establish care. Her daughter is with her.  She is requesting a CPE today but we will need to do an establish care visit today get her medical history since I have no medical records from her previous providers and she has a complex medical history. She is ok with this plan.  States she moved here 2 years ago from [place].  Diagnosed in 2009 with retinitis pigmentosa in [place]. She is legally blind. States she has also been told she has glaucoma and cataracts but is not sure if this was diagnosed officially.   Denies history of diabetes.  HTN: diagnosed in 2009. Checks her BP at home and states her diastolic number is usually 90 or higher. She does not have her medications with her today.   States she was diagnosed with CHF in 2016 in [place]. States she has been seen at [facility] here in town but due to missed visits and financial issues she cannot go back. States she needs a new cardiologist.    States Dr. Ayesha Rumpf has been her PCP at [facility] practice.   There is no immunization history on file for this patient.  Last mammogram: July 2017 Last colonoscopy: 2006  Lives with daughter and son. She worked as an Therapist, sports for years.    Reviewed allergies, medications, past medical, surgical, family, and social history.    Review of Systems Review of Systems Constitutional: -fever, -chills, -sweats, -unexpected weight change,-fatigue Cardiology:  -chest pain, -palpitations, -edema Respiratory: -cough, -shortness of breath, -wheezing Gastroenterology: -abdominal pain, -nausea, -vomiting, -diarrhea, -constipation  Ophthalmology: -vision changes Urology: -dysuria, -difficulty urinating,  -hematuria, -urinary frequency, -urgency Neurology: -headache, -weakness, -tingling, -numbness       Objective:   Physical Exam BP 132/90   Pulse 75   Ht 5' 7.25" (1.708 m)   Wt 237 lb 9.6 oz (107.8 kg)   BMI 36.94 kg/m   Alert and in no distress.  Pharyngeal area is normal. Neck is supple without adenopathy or thyromegaly. Cardiac exam shows a regular rhythm without murmurs or gallops. Lungs are clear to auscultation. Extremities without edema.       Assessment & Plan:  Hypertension, unspecified type - Plan: CBC with Differential/Platelet, COMPLETE METABOLIC PANEL WITH GFR, Lipid panel, Ambulatory referral to Cardiology  Chronic congestive heart failure, unspecified heart failure type ([place]) - Plan: CBC with Differential/Platelet, COMPLETE METABOLIC PANEL WITH GFR, Lipid panel, Ambulatory referral to Cardiology  Encounter to establish care  Screen for colon cancer - Plan: Ambulatory referral to Gastroenterology  Retinitis pigmentosa  Blind  Family history of colon cancer in mother - Plan: Ambulatory referral to Gastroenterology  Screening for ischemic heart disease - Plan: Lipid panel  Discussed that she has a complex medical history and I will need medical records from previous providers. She is not in any distress today and appears well.  She will continue on current medications for now. Continue checking BP at home.  Discussed healthy lifestyle modifications.  Discussed health maintenance and she is deficient in some areas.  Referral made to retinal specialist per patient request.  Referral made to cardiology.  Last colonoscopy was 2006 per patient and normal. Mother died from colon cancer per patient. GI referral made.  She is fasting and will check labs today including lipids.  Follow up pending labs.  Once I have received medical records we will get her back for an AWV and to further discuss chronic health conditions.  Spent a minimum of 45 minutes face to face with  patient and at least 50% was in counseling and coordination of care.

## 2017-03-21 ENCOUNTER — Telehealth: Payer: Self-pay

## 2017-03-21 NOTE — Telephone Encounter (Signed)
Records rcvd from [facility] in your folder for review. Kristin Ward

## 2017-03-22 LAB — HEPATITIS PANEL, ACUTE
HCV Ab: NONREACTIVE
HEP A IGM: NONREACTIVE
HEP B C IGM: NONREACTIVE
HEP B S AG: NONREACTIVE

## 2017-03-25 ENCOUNTER — Encounter: Payer: Self-pay | Admitting: Family Medicine

## 2017-03-25 DIAGNOSIS — J449 Chronic obstructive pulmonary disease, unspecified: Secondary | ICD-10-CM | POA: Insufficient documentation

## 2017-03-26 ENCOUNTER — Encounter: Payer: Self-pay | Admitting: Internal Medicine

## 2017-04-02 ENCOUNTER — Telehealth: Payer: Self-pay

## 2017-04-02 NOTE — Telephone Encounter (Signed)
Records rcvd from [facility]. Placed in your folder for review. Kristin Ward

## 2017-04-07 ENCOUNTER — Telehealth: Payer: Self-pay | Admitting: Internal Medicine

## 2017-04-07 ENCOUNTER — Ambulatory Visit (INDEPENDENT_AMBULATORY_CARE_PROVIDER_SITE_OTHER): Payer: Medicare HMO | Admitting: Family Medicine

## 2017-04-07 ENCOUNTER — Encounter: Payer: Self-pay | Admitting: Family Medicine

## 2017-04-07 ENCOUNTER — Other Ambulatory Visit (HOSPITAL_COMMUNITY)
Admission: RE | Admit: 2017-04-07 | Discharge: 2017-04-07 | Disposition: A | Discharge status: Home / Self Care | Payer: Self-pay | Source: Ambulatory Visit | Attending: Family Medicine | Admitting: Family Medicine

## 2017-04-07 VITALS — BP 122/80 | HR 60 | Ht 68.5 in | Wt 239.6 lb

## 2017-04-07 DIAGNOSIS — E2839 Other primary ovarian failure: Secondary | ICD-10-CM | POA: Diagnosis not present

## 2017-04-07 DIAGNOSIS — Z1231 Encounter for screening mammogram for malignant neoplasm of breast: Secondary | ICD-10-CM

## 2017-04-07 DIAGNOSIS — Z124 Encounter for screening for malignant neoplasm of cervix: Secondary | ICD-10-CM | POA: Insufficient documentation

## 2017-04-07 DIAGNOSIS — Z0000 Encounter for general adult medical examination without abnormal findings: Secondary | ICD-10-CM

## 2017-04-07 DIAGNOSIS — Z7189 Other specified counseling: Secondary | ICD-10-CM | POA: Diagnosis not present

## 2017-04-07 DIAGNOSIS — R911 Solitary pulmonary nodule: Secondary | ICD-10-CM | POA: Diagnosis not present

## 2017-04-07 DIAGNOSIS — R87612 Low grade squamous intraepithelial lesion on cytologic smear of cervix (LGSIL): Secondary | ICD-10-CM | POA: Insufficient documentation

## 2017-04-07 DIAGNOSIS — Z1239 Encounter for other screening for malignant neoplasm of breast: Secondary | ICD-10-CM

## 2017-04-07 DIAGNOSIS — I10 Essential (primary) hypertension: Secondary | ICD-10-CM

## 2017-04-07 DIAGNOSIS — H547 Unspecified visual loss: Secondary | ICD-10-CM | POA: Diagnosis not present

## 2017-04-07 DIAGNOSIS — Z01419 Encounter for gynecological examination (general) (routine) without abnormal findings: Secondary | ICD-10-CM | POA: Diagnosis not present

## 2017-04-07 DIAGNOSIS — H3552 Pigmentary retinal dystrophy: Secondary | ICD-10-CM | POA: Diagnosis not present

## 2017-04-07 DIAGNOSIS — I509 Heart failure, unspecified: Secondary | ICD-10-CM

## 2017-04-07 NOTE — Telephone Encounter (Signed)
Called and spoke with Kristin Ward. And was told the claim has not been denied, no peer to peer review is needed at this point. They need clinical information. I will finish my note and then please take care of this. Thanks.

## 2017-04-07 NOTE — Patient Instructions (Signed)
Call and schedule your mammogram and bone density exams.   We will call you with results from your chest CT and pap smear.   Let me know if you decide to get flu shot, pneumonia shots and any other vaccines after you see your retina specialist.

## 2017-04-07 NOTE — Progress Notes (Signed)
Kristin Ward is a 61 y.o. female who presents for annual wellness visit and follow-up on chronic medical conditions.  She has the following concerns: States she is due for her mammogram. Has never had a bone density test.   History of smoking. States she smoked cigarettes and marijuana for years and stopped smoking both in February 2017. States she was diagnosed with COPD in [place] last year as well but states she was eventually diagnosed with heart issues instead of lung problems.  States she has not needed rescue inhaler.   She is aware that she had a pulmonary nodule on XR and CT in [place] in May 2017. A 7 mm non-calcified nodule at the right lung base found. Neoplasm could not be ruled out. Short-term follow up was recommended. Patient states she did not follow up.   Lives with her son and daughter in a one level house and uses a cane to get around.  States they cook for her and she manages to care for herself otherwise.    There is no immunization history on file for this patient. Last Pap smear: years ago. Stopped having periods 3 years ago.  Last mammogram: 02/2016 Last colonoscopy: appointment with GI in September and October 2018 Last DEXA: never had one  Dentist: last year in [place] Ophtho: scheduled to see retina specialist  Exercise: walks daily.   Other doctors caring for patient include: GI- Nandigam, cardiologist- Dr. Ellyn Hack    Depression screen:  See questionnaire below.  Depression screen [facility] 2/9 04/07/2017 03/20/2017  Decreased Interest 0 0  Down, Depressed, Hopeless 0 0  PHQ - 2 Score 0 0    Fall Risk Screen: see questionnaire below. Fall Risk  04/07/2017 03/20/2017  Falls in the past year? Yes Yes  Number falls in past yr: 2 or more 2 or more  Injury with Fall? Yes No  Risk for fall due to : Impaired balance/gait Impaired balance/gait    ADL screen:  See questionnaire below Functional Status Survey: Is the patient deaf or have difficulty hearing?: Yes  (right ear- due to aging) Does the patient have difficulty seeing, even when wearing glasses/contacts?: Yes (legally blind) Does the patient have difficulty concentrating, remembering, or making decisions?: No Does the patient have difficulty walking or climbing stairs?: No Does the patient have difficulty dressing or bathing?: No Does the patient have difficulty doing errands alone such as visiting a doctor's office or shopping?: No   End of Life Discussion:  Patient does not have a living will and medical power of attorney. States she wants to be a DNR. She would like to have this documented in her chart.   Review of Systems Constitutional: -fever, -chills, -sweats, -unexpected weight change, -anorexia, -fatigue Allergy: -sneezing, -itching, -congestion Dermatology: denies changing moles, rash, lumps, new worrisome lesions ENT: -runny nose, -ear pain, -sore throat, -hoarseness, -sinus pain, -teeth pain, -tinnitus, -hearing loss, -epistaxis Cardiology:  -chest pain, -palpitations, -edema, -orthopnea, -paroxysmal nocturnal dyspnea Respiratory: -cough, -shortness of breath, -dyspnea on exertion, -wheezing, -hemoptysis Gastroenterology: -abdominal pain, -nausea, -vomiting, -diarrhea, -constipation, -blood in stool, -changes in bowel movement, -dysphagia Hematology: -bleeding or bruising problems Musculoskeletal: -arthralgias, -myalgias, -joint swelling, -back pain, -neck pain, -cramping, -gait changes Ophthalmology: -vision changes, -eye redness, -itching, -discharge Urology: -dysuria, -difficulty urinating, -hematuria, -urinary frequency, -urgency, incontinence Neurology: -headache, -weakness, -tingling, -numbness, -speech abnormality, -memory loss, -falls, -dizziness Psychology:  -depressed mood, -agitation, -sleep problems    PHYSICAL EXAM:  BP 122/80   Pulse 60  Ht 5' 8.5" (1.74 m)   Wt 239 lb 9.6 oz (108.7 kg)   BMI 35.90 kg/m   General Appearance: Alert, cooperative, no  distress, appears older than stated age Head: Normocephalic, without obvious abnormality, atraumatic Eyes: PERRL, conjunctiva/corneas clear, EOM's intact Ears: Normal TM's and external ear canals Nose: Nares normal, mucosa normal, no drainage or sinus   tenderness Throat: Lips, mucosa, and tongue normal; teeth and gums normal Neck: Supple, no lymphadenopathy; thyroid: no enlargement/tenderness/nodules; no carotid bruit or JVD Back: Spine nontender, no curvature, ROM normal, no CVA tenderness Lungs: Clear to auscultation bilaterally without wheezes, rales or ronchi; respirations unlabored Chest Wall: No tenderness or deformity Heart: Regular rate and rhythm, S1 and S2 normal, no murmur, rub or gallop Breast Exam: No tenderness, masses, or nipple discharge or inversion. No axillary lymphadenopathy Abdomen: Soft, non-tender, nondistended, normoactive bowel sounds, no masses, no hepatosplenomegaly Genitalia: Normal external genitalia without lesions.  BUS and vagina normal; cervix without lesions, or cervical motion tenderness. No abnormal vaginal discharge.  Uterus and adnexa not enlarged, nontender, no masses.  Pap performed, chaperone present.  Rectal: declined. GI appointment upcoming.  Extremities: No clubbing, cyanosis or edema Pulses: 2+ and symmetric all extremities Skin: Skin color, texture, turgor normal, no rashes or lesions Lymph nodes: Cervical, supraclavicular, and axillary nodes normal Neurologic: CNII-XII intact, normal strength, sensation and gait; reflexes 2+ and symmetric throughout Psych: Normal mood, affect, hygiene and grooming.  ASSESSMENT/PLAN: Medicare annual wellness visit, subsequent  Hypertension, unspecified type  Chronic congestive heart failure, unspecified heart failure type ([place])  Solitary pulmonary nodule on lung CT - Plan: CT Angio Chest W/Cm &/Or Wo Cm  Retinitis pigmentosa  Blind  Screening for cervical cancer - Plan: Cytology - PAP  Screening for  breast cancer - Plan: MM DIGITAL SCREENING BILATERAL  Estrogen deficiency - Plan: DG Bone Density  DNR (do not resuscitate) discussion  Counseling regarding advanced directives  BP is within goal. Continue on current medication regimen.  CHF- has upcoming cardiology appointment.  Retinitis pigmentosa and blindness- referral has been made to retina specialist.  Counseling done on DNR meaning and she would like to proceed with filling this out. This will be scanned into the chart. She is aware that if she changes her mind about this status she should let us know.   Attempting to get CTA chest approved to recheck pulmonary nodule and rule out cancer. Awaiting approval for study.   She declines flu shot, pneumonia shot and any vaccines today. States she would like to check with her retina specialist and see if she should get these.     Discussed monthly self breast exams and yearly mammograms; at least 30 minutes of aerobic activity at least 5 days/week and weight-bearing exercise 2x/week; proper sunscreen use reviewed; healthy diet, including goals of calcium and vitamin D intake and alcohol recommendations (less than or equal to 1 drink/day) reviewed; regular seatbelt use; changing batteries in smoke detectors.  Immunization recommendations discussed.  Colonoscopy recommendations reviewed   Medicare Attestation I have personally reviewed: The patient's medical and social history Their use of alcohol, tobacco or illicit drugs Their current medications and supplements The patient's functional ability including ADLs,fall risks, home safety risks, cognitive, and hearing and visual impairment Diet and physical activities Evidence for depression or mood disorders  The patient's weight, height, and BMI have been recorded in the chart.  I have made referrals, counseling, and provided education to the patient based on review of the above and I have  provided the patient with a written personalized  care plan for preventive services.     Harland Dingwall, NP-C   04/07/2017

## 2017-04-07 NOTE — Telephone Encounter (Signed)
Called pt insurance company for CTA and needs peer to peer review.  Please call pt's insurance and ask to speak to nurse for review  [employer] to call for Prior auth [id number]  Member ID- Kristin Ward  Date of birth- 05-13-1956  Case # [id number]  CPT- [phone]  ICD 10- R91.1  I will also fax ov notes in for review as well. Fx # [phone]

## 2017-04-08 ENCOUNTER — Other Ambulatory Visit: Payer: Self-pay | Admitting: Family Medicine

## 2017-04-08 DIAGNOSIS — Z1231 Encounter for screening mammogram for malignant neoplasm of breast: Secondary | ICD-10-CM

## 2017-04-08 NOTE — Telephone Encounter (Signed)
I have faxed over office notes from yesterday over to [employer]

## 2017-04-09 ENCOUNTER — Encounter: Payer: Self-pay | Admitting: Family Medicine

## 2017-04-09 DIAGNOSIS — R87612 Low grade squamous intraepithelial lesion on cytologic smear of cervix (LGSIL): Secondary | ICD-10-CM | POA: Insufficient documentation

## 2017-04-09 DIAGNOSIS — B977 Papillomavirus as the cause of diseases classified elsewhere: Secondary | ICD-10-CM | POA: Insufficient documentation

## 2017-04-09 LAB — CYTOLOGY - PAP: HPV: DETECTED — AB

## 2017-04-10 ENCOUNTER — Other Ambulatory Visit: Payer: Self-pay | Admitting: Family Medicine

## 2017-04-10 ENCOUNTER — Telehealth: Payer: Self-pay | Admitting: Family Medicine

## 2017-04-10 DIAGNOSIS — R911 Solitary pulmonary nodule: Secondary | ICD-10-CM

## 2017-04-10 DIAGNOSIS — B977 Papillomavirus as the cause of diseases classified elsewhere: Secondary | ICD-10-CM

## 2017-04-10 DIAGNOSIS — R87612 Low grade squamous intraepithelial lesion on cytologic smear of cervix (LGSIL): Secondary | ICD-10-CM

## 2017-04-10 NOTE — Telephone Encounter (Signed)
Patient asked for you to call her [phone]

## 2017-04-10 NOTE — Telephone Encounter (Signed)
Look at lab results

## 2017-04-16 ENCOUNTER — Telehealth: Payer: Self-pay | Admitting: *Deleted

## 2017-04-16 NOTE — Telephone Encounter (Signed)
Dr.Nandigam and Osvaldo Angst,   Please review this patient's chart. She is for direct screening colonoscopy, last colon 2006 in [place]. Did see [facility] [place]. Patient has COPD, CHF... Echo 01/04/2016 showed EF 18%. OV first or okay with Direct hospital colon? Please advise. Thank you, Edan Juday PV

## 2017-04-16 NOTE — Telephone Encounter (Signed)
Robbin,  Due to her severely decreased EF, she does not qualify for anesthetic care at [facility].  Thanks,  Osvaldo Angst

## 2017-04-17 ENCOUNTER — Other Ambulatory Visit: Payer: Self-pay

## 2017-04-17 DIAGNOSIS — Z1211 Encounter for screening for malignant neoplasm of colon: Secondary | ICD-10-CM

## 2017-04-17 NOTE — Telephone Encounter (Signed)
Ok. Kristin Ward, can you please schedule at [facility], next available. Thanks

## 2017-04-17 NOTE — Telephone Encounter (Signed)
Noted. Contacted the patient and explained why she is being moved to the hospital. Next available appointment for colonoscopy at [facility] will be 06/03/17. Also moved her instructional visit with the nurse to fit the patient's needs. She thanks me for my call.

## 2017-04-21 ENCOUNTER — Other Ambulatory Visit: Payer: Self-pay

## 2017-04-23 ENCOUNTER — Ambulatory Visit: Payer: Self-pay

## 2017-04-23 ENCOUNTER — Other Ambulatory Visit: Payer: Self-pay

## 2017-04-28 ENCOUNTER — Other Ambulatory Visit: Payer: Self-pay

## 2017-04-30 ENCOUNTER — Encounter: Payer: Self-pay | Admitting: Cardiology

## 2017-04-30 ENCOUNTER — Ambulatory Visit (INDEPENDENT_AMBULATORY_CARE_PROVIDER_SITE_OTHER): Payer: Medicare HMO | Admitting: Cardiology

## 2017-04-30 VITALS — BP 118/81 | HR 65 | Ht 66.0 in | Wt 237.4 lb

## 2017-04-30 DIAGNOSIS — I420 Dilated cardiomyopathy: Secondary | ICD-10-CM | POA: Diagnosis not present

## 2017-04-30 DIAGNOSIS — I10 Essential (primary) hypertension: Secondary | ICD-10-CM

## 2017-04-30 DIAGNOSIS — I5042 Chronic combined systolic (congestive) and diastolic (congestive) heart failure: Secondary | ICD-10-CM | POA: Diagnosis not present

## 2017-04-30 DIAGNOSIS — R9439 Abnormal result of other cardiovascular function study: Secondary | ICD-10-CM

## 2017-04-30 NOTE — Progress Notes (Signed)
PCP: Girtha Rm, NP-C  Clinic Note: Chief Complaint  Patient presents with  . New Patient (Initial Visit)    Cardiomyopathy    HPI: Kristin Ward is a 61 y.o. female who is being seen today To establish cardiology care in [facility] for management of Dilated Cardiomyopathy at the request of Henson, Vickie L, NP-C. Apparently she was previously followed here and recommended by [facility] -> due to financial issues and missed visits, she was told that she needs to find a cardiologist. She is essentially legally blind as result of retinitis pigmentosa.  Kristin Ward was last seen by her Cardiologist (in [place]) last fall where she was diagnosed with cardiomyopathy based on abnormal echocardiogram. She was started on an excellent regimen with carvedilol, Entresto, spironolactone and amlodipine as well as furosemide.  At the time of her diagnosis of heart failure she was profoundly dyspneic minimal exertion (unable to walk more than 10 feet) and had PND/orthopnea as well as edema. She gained a significant amount of weight nightly symptoms apparently at the time of her diagnosis she was fitted with a LifeVest and then decided against ICD was turned in. She apparently never had a heart catheterization, but perhaps had a stress test.  Recent Hospitalizations: None  Studies Personally Reviewed - (if available, images/films reviewed: From Epic Chart or Care Everywhere)  Echocardiogram done at [facility] May 2017: Mild concentric LVH. Global hypokinesis. GR daily. EF 18% severe LA dilation. Mitral annular dilatation with papillary muscle dysfunction and moderate MR. Dilated IVC consistent with elevated RAP.  Interval History: July presents today to establish care. She tells me that at this point she is able to carry out most activities without difficulty. She can walk a mile 2 miles at a time with her daughter. She doesn't walk as fast as she was walk,  but does not have any significant dyspnea. He no longer has PND or orthopnea. She denies any chest tightness pressure with rest or exertion. If she walks briskly up a hill or up stairs, she may note some dyspnea, but this is been some predated her diagnosis.  No palpitations, lightheadedness, dizziness, weakness or syncope/near syncope. No TIA/amaurosis fugax symptoms. No claudication.  ROS: A comprehensive was performed. Pertinent symptoms noted above besides the poor vision secondary to retinitis pigmentosa. Also noted that she is a very poor historian Review of Systems  Constitutional: Negative for malaise/fatigue.  HENT: Negative for congestion, hearing loss and nosebleeds.   Eyes:       She is essentially legally blind. She sees light coming in, but is not able to the signet ring features.  Respiratory: Positive for cough (Off-and-on depending on allergies). Negative for shortness of breath and wheezing.   Cardiovascular:       Per history of present illness  Gastrointestinal: Negative for abdominal pain, blood in stool, constipation and melena.  Genitourinary: Negative for hematuria.  Musculoskeletal: Positive for joint pain. Negative for myalgias.  Neurological: Negative for dizziness and focal weakness.  Endo/Heme/Allergies: Negative for environmental allergies.  Psychiatric/Behavioral: Negative for memory loss. The patient is not nervous/anxious and does not have insomnia.   All other systems reviewed and are negative.  PAD Screen 04/30/2017  Previous PAD dx? No  Previous surgical procedure? No  Pain with walking? No  Feet/toe relief with dangling? No  Painful, non-healing ulcers? No  Extremities discolored? No    I have reviewed and (if needed) personally updated the patient's problem list, medications, allergies, past medical  and surgical history, social and family history.   Past Medical History:  Diagnosis Date  . Abnormal myocardial perfusion study 03/15/2016   EF  15%, severe LV systolic dysfcn, global hypokinesis and inferior akinesis. dilated LV  . Abnormal x-ray of lungs with single pulmonary nodule 03/15/2016   per records from [place]   . Blind   . COPD (chronic obstructive pulmonary disease) ([place])   . 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 Diagnosed in [facility]  . Hypertension   . Retinitis pigmentosa   . Solitary pulmonary nodule on lung CT 03/16/2016   per records from [facility]  . Uveitis     Past Surgical History:  Procedure Laterality Date  . brain cyst removed     to help with headaches per patient   . NASAL TURBINATE REDUCTION    . TRANSTHORACIC ECHOCARDIOGRAM  12/2015   [facility] May 2017: Mild concentric LVH. Global hypokinesis. GR daily. EF 18% severe LA dilation. Mitral annular dilatation with papillary muscle dysfunction and moderate MR. Dilated IVC consistent with elevated RAP.    Current Meds  Medication Sig  . amLODipine (NORVASC) 5 MG tablet Take 1 tablet (5 mg total) by mouth daily.  . ASPIRIN 81 PO Take by mouth.  . carvedilol (COREG) 12.5 MG tablet Take 12.5 mg by mouth 2 (two) times daily with a meal.  . furosemide (LASIX) 40 MG tablet Take 40 mg by mouth.  . sacubitril-valsartan (ENTRESTO) 24-26 MG Take 1 tablet by mouth 2 (two) times daily.  Marland Kitchen spironolactone (ALDACTONE) 25 MG tablet Take 25 mg by mouth daily.  . [DISCONTINUED] albuterol (PROVENTIL HFA;VENTOLIN HFA) 108 (90 BASE) MCG/ACT inhaler Inhale 2 puffs into the lungs every 6 (six) hours as needed for wheezing or shortness of breath.     Allergies  Allergen Reactions  . Latex Hives and Itching    Burning   . Penicillins Nausea Only  . Tylenol With Codeine #3 [Acetaminophen-Codeine] Nausea And Vomiting    Social History   Social History  . Marital status: Widowed    Spouse name: N/A  . Number of children: N/A  . Years of education: N/A   Social History Main Topics  . Smoking status: Former  Smoker    Types: Cigarettes    Quit date: 10/09/2015  . Smokeless tobacco: Never Used  . Alcohol use No  . Drug use: No  . Sexual activity: No   Other Topics Concern  . None   Social History Narrative   She is a widowed mother of 2 (daughter and son).   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   She moved to [place] apparently back in 2016, but was visiting family in [place] and was diagnosed with Cardiomyopathy.   She is accompanied by her daughter. She walks roughly 1-2 miles a day.    family history includes Colon cancer (age of onset: 72) in her mother; Heart attack in her father.  Wt Readings from Last 3 Encounters:  04/30/17 237 lb 6.4 oz (107.7 kg)  04/07/17 239 lb 9.6 oz (108.7 kg)  03/20/17 237 lb 9.6 oz (107.8 kg)    PHYSICAL EXAM BP 118/81   Pulse 65   Ht 5\' 6"  (1.676 m)   Wt 237 lb 6.4 oz (107.7 kg)   BMI 38.32 kg/m  Physical Exam  Constitutional: She is oriented to person, place, and time. She appears well-developed and well-nourished. No distress.  HENT:  Head: Normocephalic and atraumatic.  Mouth/Throat: No oropharyngeal exudate.  Eyes: No scleral icterus.  She essentially is blind. Unable to follow my finger with her eyes. Unable to make out most features besides seeing my figure in front of her  Neck: Normal range of motion. Neck supple. No hepatojugular reflux and no JVD present. Carotid bruit is not present. No thyromegaly present.  Cardiovascular: Normal rate, regular rhythm, S1 normal, S2 normal and intact distal pulses.   Occasional extrasystoles are present. PMI is not displaced (Difficult to palpate).  Exam reveals distant heart sounds. Exam reveals no gallop and no friction rub.   Murmur heard.  Medium-pitched blowing decrescendo holosystolic murmur is present  at the lower left sternal border, apex Pulmonary/Chest: Effort normal and breath sounds normal. No respiratory distress. She has no wheezes. She has no rales.  Abdominal: Soft. Bowel  sounds are normal. She exhibits no distension. There is no tenderness. There is no rebound.  Musculoskeletal: Normal range of motion. She exhibits no edema.  Lymphadenopathy:    She has no cervical adenopathy.  Neurological: She is alert and oriented to person, place, and time.  Blind. Unable to track EOM. Otherwise cranial nerves grossly intact  Skin: Skin is warm and dry. No rash noted. No erythema. No pallor.  Psychiatric: She has a normal mood and affect. Her behavior is normal.  Relatively poor historian. Poor memory of her overall condition and different studies. Is always running things by her daughter.  Nursing note and vitals reviewed.    Adult ECG Report  Rate: 65 ;  Rhythm: normal sinus rhythm and 1 AVB. ~LVH. T-wave abnormality consider lateral ischemia.; (most likely consistent with LVH  Narrative Interpretation: No significant change from previous (August 2017)   Other studies Reviewed: Additional studies/ records that were reviewed today include:  Recent Labs:   Lab Results  Component Value Date   CREATININE 0.78 03/20/2017   BUN 13 03/20/2017   NA 141 03/20/2017   K 4.2 03/20/2017   CL 108 03/20/2017   CO2 22 03/20/2017   Lab Results  Component Value Date   WBC 7.5 03/20/2017   HGB 12.7 03/20/2017   HCT 37.9 03/20/2017   MCV 89.6 03/20/2017   PLT 362 03/20/2017   Lab Results  Component Value Date   CHOL 195 03/20/2017   HDL 46 (L) 03/20/2017   LDLCALC 128 (H) 03/20/2017   TRIG 104 03/20/2017   CHOLHDL 4.2 03/20/2017   No results found for: HGBA1C  ASSESSMENT / PLAN: Problem List Items Addressed This Visit    Abnormal myocardial perfusion study - Primary (Chronic)   Relevant Orders   EKG 12-Lead (Completed)   ECHOCARDIOGRAM COMPLETE   Chronic combined systolic and diastolic heart failure (HCC) (Chronic)    Again, she is not acting as though her EF is in 15-20% range with her lack of symptoms. She is maybe CHF class I by the symptoms she  describes. She is on outstanding regimen as noted.    She has no PND, orthopnea or edema today. She has no significant exertional dyspnea. I would therefore consider today's weight is being decent baseline dry weight. We did discuss sliding scale Lasix for weight gain greater than 3 pounds. She tells me that her weights have been within roughly 3 pounds for the last several months.      Dilated cardiomyopathy ([place]) (Chronic)    She has not had a follow-up evaluation since 2017. At that time she appeared to be in decompensated heart failure class 3-4, however  now based on her symptoms, she appears to be much improved overall. The fact that she is able to walk 1 or 2 miles a day without issue makes me suspect that her ejection fraction has improved.  Unfortunately I do not have the remainder the records from her previous cardiology evaluations besides her echocardiogram. At the time that I saw her, she was not sure who her primary cardiologist here in town was. Now that we have this data, we can attempt to get records from both [facility] and [facility]. Would like to see if any ischemic evaluation was done.  For now, we will also check 2-D echocardiogram just to reassess her ejection fraction.  She is on an outstanding regimen with a blood pressure control. - Continue combination of Entresto, carvedilol, spironolactone with the addition of amlodipine for blood pressure control. She is on a standing dose of furosemide which she has not required when necessary dosing.      Relevant Orders   EKG 12-Lead (Completed)   ECHOCARDIOGRAM COMPLETE      Current medicines are reviewed at length with the patient today. (+/- concerns) n/a The following changes have been made: n/a  Patient Instructions  MEDICATIONS NO CHANGES WITH MEDICATIONS. Sliding scale Lasix: Weigh yourself when you get home, then Daily in the Morning. Your dry weight will be what your scale says on the  day you return home.(here is 237 lbs.).   If you gain more than 3 pounds from dry weight: Increase the Lasix dosing to 80 mg in the morning  until weight returns to baseline dry weight.  If weight gain is greater than 5 pounds in 2 days: Increased to Lasix 80 mg  and 40 mg in the afternoon and contact the office for further assistance if weight does not go down the next day.  If the weight goes down more than 3 pounds from dry weight: Hold Lasix until it returns to baseline dry weight  Your physician recommends that you weigh, daily, at the same time every day, and in the same amount of clothing.  record your daily weights      Schedule at [place] has requested that you have an echocardiogram. Echocardiography is a painless test that uses sound waves to create images of your heart. It provides your doctor with information about the size and shape of your heart and how well your heart's chambers and valves are working. This procedure takes approximately one hour. There are no restrictions for this procedure.   Your physician recommends that you schedule a follow-up appointment in 2 [place].     Studies Ordered:   Orders Placed This Encounter  Procedures  . EKG 12-Lead  . ECHOCARDIOGRAM COMPLETE      Glenetta Hew, M.D., M.S. Interventional Cardiologist   Pager # [phone] Phone # [phone] [address] [place], [place] [address]

## 2017-04-30 NOTE — Patient Instructions (Addendum)
MEDICATIONS NO CHANGES WITH MEDICATIONS. Sliding scale Lasix: Weigh yourself when you get home, then Daily in the Morning. Your dry weight will be what your scale says on the day you return home.(here is 237 lbs.).   If you gain more than 3 pounds from dry weight: Increase the Lasix dosing to 80 mg in the morning  until weight returns to baseline dry weight.  If weight gain is greater than 5 pounds in 2 days: Increased to Lasix 80 mg  and 40 mg in the afternoon and contact the office for further assistance if weight does not go down the next day.  If the weight goes down more than 3 pounds from dry weight: Hold Lasix until it returns to baseline dry weight  Your physician recommends that you weigh, daily, at the same time every day, and in the same amount of clothing.  record your daily weights      Schedule at [facility] has requested that you have an echocardiogram. Echocardiography is a painless test that uses sound waves to create images of your heart. It provides your doctor with information about the size and shape of your heart and how well your heart's chambers and valves are working. This procedure takes approximately one hour. There are no restrictions for this procedure.   Your physician recommends that you schedule a follow-up appointment in 2 [place].

## 2017-05-01 ENCOUNTER — Ambulatory Visit: Payer: Self-pay

## 2017-05-01 ENCOUNTER — Other Ambulatory Visit: Payer: Self-pay

## 2017-05-06 ENCOUNTER — Encounter: Payer: Self-pay | Admitting: Cardiology

## 2017-05-06 NOTE — Assessment & Plan Note (Signed)
I see an echocardiogram report, I do not see a Myoview report. We will try to get her outside records.

## 2017-05-06 NOTE — Assessment & Plan Note (Signed)
Well-controlled on current regimen. ?

## 2017-05-06 NOTE — Assessment & Plan Note (Signed)
She has not had a follow-up evaluation since 2017. 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 The fact that she is able to walk 1 or 2 miles a day without issue makes me suspect that her ejection fraction has improved.  Unfortunately I do not have the remainder the records from her previous cardiology evaluations besides her echocardiogram. At the time that I saw her, she was not sure who her primary cardiologist here in town was. Now that we have this data, we can attempt to get records from both [facility] and [facility]. Would like to see if any ischemic evaluation was done.  For now, we will also check 2-D echocardiogram just to reassess her ejection fraction.  She is on an outstanding regimen with a blood pressure control. - Continue combination of Entresto, carvedilol, spironolactone with the addition of amlodipine for blood pressure control. She is on a standing dose of furosemide which she has not required when necessary dosing.

## 2017-05-06 NOTE — Assessment & Plan Note (Signed)
Again, she is not acting as though her EF is in 15-20% range with her lack of symptoms. She is maybe CHF class I by the symptoms she describes. She is on outstanding regimen as noted.    She has no PND, orthopnea or edema today. She has no significant exertional dyspnea. I would therefore consider today's weight is being decent baseline dry weight. We did discuss sliding scale Lasix for weight gain greater than 3 pounds. She tells me that her weights have been within roughly 3 pounds for the last several months.

## 2017-05-09 ENCOUNTER — Ambulatory Visit (INDEPENDENT_AMBULATORY_CARE_PROVIDER_SITE_OTHER): Payer: Medicare HMO | Admitting: Obstetrics & Gynecology

## 2017-05-09 ENCOUNTER — Encounter: Payer: Self-pay | Admitting: Obstetrics & Gynecology

## 2017-05-09 VITALS — BP 147/87 | HR 73

## 2017-05-09 DIAGNOSIS — N870 Mild cervical dysplasia: Secondary | ICD-10-CM | POA: Diagnosis not present

## 2017-05-09 DIAGNOSIS — N9089 Other specified noninflammatory disorders of vulva and perineum: Secondary | ICD-10-CM

## 2017-05-09 DIAGNOSIS — R87612 Low grade squamous intraepithelial lesion on cytologic smear of cervix (LGSIL): Secondary | ICD-10-CM

## 2017-05-09 NOTE — Progress Notes (Signed)
   Subjective:    Patient ID: Rex Kras, female    DOB: 1955-08-23, 61 y.o.   MRN: [id number]  HPI 61 yo AA lady here for a colpo due to LGSIL pap 8/18. This was her first pap in abouit 20 years. She also complains of a sore area on her perineum that comes and goes.  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 She has been menopausal for about 3 years. Wears Depends at times    Objective:   Physical Exam Well nourished, well hydrated black female, no apparent distress  She has a 1 cm excoriation on her perineum, sore.  UPT negative, consent signed, time out done Cervix prepped with acetic acid. Transformation zone seen in its entirety. Colpo adequate. Entirely normal colpo ECC obtained. She tolerated the procedure well    Assessment & Plan:  LGSIL pap with normal colpo. If ECC is negative, then repeat pap and cotesting in 1 year. Check HSV2 IgG

## 2017-05-10 LAB — HSV 2 ANTIBODY, IGG: HSV 2 IgG, Type Spec: 7.41 index — ABNORMAL HIGH (ref 0.00–0.90)

## 2017-05-13 ENCOUNTER — Encounter: Payer: Commercial Managed Care - HMO | Admitting: Gastroenterology

## 2017-05-14 ENCOUNTER — Other Ambulatory Visit: Payer: Self-pay

## 2017-05-14 ENCOUNTER — Telehealth: Payer: Self-pay

## 2017-05-14 ENCOUNTER — Encounter: Payer: Self-pay | Admitting: *Deleted

## 2017-05-14 ENCOUNTER — Ambulatory Visit (HOSPITAL_COMMUNITY): Payer: Medicare HMO | Attending: Cardiology

## 2017-05-14 DIAGNOSIS — I420 Dilated cardiomyopathy: Secondary | ICD-10-CM | POA: Diagnosis not present

## 2017-05-14 DIAGNOSIS — J449 Chronic obstructive pulmonary disease, unspecified: Secondary | ICD-10-CM | POA: Insufficient documentation

## 2017-05-14 DIAGNOSIS — R9439 Abnormal result of other cardiovascular function study: Secondary | ICD-10-CM | POA: Insufficient documentation

## 2017-05-14 DIAGNOSIS — I509 Heart failure, unspecified: Secondary | ICD-10-CM | POA: Diagnosis not present

## 2017-05-14 DIAGNOSIS — A6000 Herpesviral infection of urogenital system, unspecified: Secondary | ICD-10-CM

## 2017-05-14 MED ORDER — VALACYCLOVIR HCL 1 G PO TABS: 1000.0000 mg | ORAL_TABLET | Freq: Every day | ORAL | 6 refills | Status: DC

## 2017-05-14 NOTE — Telephone Encounter (Signed)
Left message on voicemail for patient to return call to office.

## 2017-05-14 NOTE — Telephone Encounter (Signed)
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 Please offer her valtrex 1 gram po BID for 7 days, 6 refills. Thanks

## 2017-05-15 ENCOUNTER — Telehealth: Payer: Self-pay

## 2017-05-15 ENCOUNTER — Telehealth: Payer: Self-pay | Admitting: Family Medicine

## 2017-05-15 NOTE — Telephone Encounter (Signed)
Pt informed of results. Transferred to front staff to schedule LEEP

## 2017-05-15 NOTE — Telephone Encounter (Signed)
These medications are all for her heart and typically I do not prescribe entresto. I would be more comfortable with her cardiologist refilling these.

## 2017-05-15 NOTE — Telephone Encounter (Signed)
Pt called and states that she needs refills on her aspirin, norvasc, coreg, lasix, entresto, aldactone, pt can be reached at [phone] pt uses [id number] - [place], [place] - Arma states she is out of all of her medicines

## 2017-05-15 NOTE — Telephone Encounter (Signed)
-----   Message from Lucianne Lei, [place] sent at 05/15/2017  8:29 AM EDT -----   ----- Message ----- From: Emily Filbert, MD Sent: 05/14/2017   3:45 PM To: Lucianne Lei, CMA  Her ECC was + from her colpo the other day.  She will need a LEEP. Thanks

## 2017-05-16 ENCOUNTER — Other Ambulatory Visit: Payer: Self-pay | Admitting: Cardiology

## 2017-05-16 MED ORDER — SPIRONOLACTONE 25 MG PO TABS
25.0000 mg | ORAL_TABLET | Freq: Every day | ORAL | 5 refills | Status: DC
Start: 2017-05-16 — End: 2017-09-08

## 2017-05-16 MED ORDER — CARVEDILOL 12.5 MG PO TABS: 12.5000 mg | ORAL_TABLET | Freq: Two times a day (BID) | ORAL | 5 refills | Status: DC

## 2017-05-16 MED ORDER — AMLODIPINE BESYLATE 5 MG PO TABS: 5.0000 mg | ORAL_TABLET | Freq: Every day | ORAL | 5 refills | Status: DC

## 2017-05-16 MED ORDER — FUROSEMIDE 40 MG PO TABS: 40.0000 mg | ORAL_TABLET | Freq: Every day | ORAL | 5 refills | Status: DC

## 2017-05-16 MED ORDER — SACUBITRIL-VALSARTAN 24-26 MG PO TABS: 1.0000 | ORAL_TABLET | Freq: Two times a day (BID) | ORAL | 5 refills | Status: DC

## 2017-05-16 NOTE — Telephone Encounter (Signed)
Dr. Ellyn Hack, pt's PCP Har[facility] Dingwall would feel more comfortable with you prescribing pt's these medications.

## 2017-05-16 NOTE — Telephone Encounter (Signed)
New Message  Mickel Baas from [facility] call requesting to speak with RN about getting prescriptions for several of pts medications that are heart meds that were not prescribed by Dr. Ellyn Hack. Please call back to discuss

## 2017-05-16 NOTE — Telephone Encounter (Signed)
I called & spoke with Barnett Applebaum Dr. Allison Quarry nurse & she will fill pt's Spironlactone, Entresto, Lasix, Coreg & [facility]asc [facility]   Called pt & informed

## 2017-05-16 NOTE — Telephone Encounter (Signed)
Returned call to [place] @ PCP office. Patient needs refills of cardiac maintenance meds (spironolactone, coreg, lasix, entresto, amlodipine). Rx(s) sent to pharmacy electronically.

## 2017-05-17 NOTE — Telephone Encounter (Signed)
OK - that is fine.   Not sure why it makes any difference who writes the medications - I am fine with all of these.  Glenetta Hew, MD

## 2017-05-19 ENCOUNTER — Other Ambulatory Visit: Payer: Self-pay | Admitting: Family Medicine

## 2017-05-19 ENCOUNTER — Ambulatory Visit
Admission: RE | Admit: 2017-05-19 | Discharge: 2017-05-19 | Disposition: A | Discharge status: Home / Self Care | Payer: Medicare HMO | Source: Ambulatory Visit | Attending: Family Medicine | Admitting: Family Medicine

## 2017-05-19 ENCOUNTER — Encounter: Payer: Self-pay | Admitting: Family Medicine

## 2017-05-19 DIAGNOSIS — I2510 Atherosclerotic heart disease of native coronary artery without angina pectoris: Secondary | ICD-10-CM

## 2017-05-19 DIAGNOSIS — R911 Solitary pulmonary nodule: Secondary | ICD-10-CM | POA: Diagnosis not present

## 2017-05-19 DIAGNOSIS — K8020 Calculus of gallbladder without cholecystitis without obstruction: Secondary | ICD-10-CM

## 2017-05-19 HISTORY — DX: Calculus of gallbladder without cholecystitis without obstruction: K80.20

## 2017-05-19 HISTORY — DX: Atherosclerotic heart disease of native coronary artery without angina pectoris: I25.10

## 2017-05-20 ENCOUNTER — Ambulatory Visit
Admission: RE | Admit: 2017-05-20 | Discharge: 2017-05-20 | Disposition: A | Discharge status: Home / Self Care | Payer: Medicare HMO | Source: Ambulatory Visit | Attending: Family Medicine | Admitting: Family Medicine

## 2017-05-20 DIAGNOSIS — E2839 Other primary ovarian failure: Secondary | ICD-10-CM

## 2017-05-20 DIAGNOSIS — Z780 Asymptomatic menopausal state: Secondary | ICD-10-CM | POA: Diagnosis not present

## 2017-05-20 DIAGNOSIS — Z1231 Encounter for screening mammogram for malignant neoplasm of breast: Secondary | ICD-10-CM

## 2017-05-21 ENCOUNTER — Telehealth: Payer: Self-pay | Admitting: *Deleted

## 2017-05-21 NOTE — Telephone Encounter (Signed)
-----   Message from Kristin Man, Kristin Ward sent at 05/15/2017  7:06 PM EDT ----- Kristin Ward news. This is our follow-up echocardiogram that shows that the pump function is almost all the way back to baseline ejection fraction of 45-50%. Only mildly reduced with normal range being 55-65% is present. This is probably why she is doing so well with minimal symptoms. In addition to the mildly reduced EF, there is only grade 1 diastolic dysfunction which is also will given her history of severe cardiomyopathy.  Overall this is great news. We can go over further details in follow-up visit. Regardless, it is nice to know that the pump function is almost back to normal.  This level of reduction is probably unlikely cause any notable symptom change.  Kristin Hew, Kristin Ward

## 2017-05-21 NOTE — Telephone Encounter (Signed)
LEFT MESSAGE TO CAL BACK- TEST ECHO RESULT

## 2017-05-22 ENCOUNTER — Other Ambulatory Visit: Payer: Self-pay | Admitting: Family Medicine

## 2017-05-22 DIAGNOSIS — R928 Other abnormal and inconclusive findings on diagnostic imaging of breast: Secondary | ICD-10-CM

## 2017-05-22 NOTE — Telephone Encounter (Signed)
New Message ° ° pt verbalized that she is returning call for rn °

## 2017-05-22 NOTE — Telephone Encounter (Signed)
Patient made aware of results and verbalized her understanding.    Notes recorded by [name], MD on 05/15/2017 at 7:06 PM EDT Great news. This is our follow-up echocardiogram that shows that the pump function is almost all the way back to baseline ejection fraction of 45-50%. Only mildly reduced with normal range being 55-65% is present. This is probably why she is doing so well with minimal symptoms. In addition to the mildly reduced EF, there is only grade 1 diastolic dysfunction which is also will given her history of severe cardiomyopathy.  Overall this is great news. We can go over further details in follow-up visit. Regardless, it is nice to know that the pump function is almost back to normal. This level of reduction is probably unlikely cause any notable symptom change.  Glenetta Hew, MD

## 2017-05-23 ENCOUNTER — Ambulatory Visit (AMBULATORY_SURGERY_CENTER): Payer: Self-pay

## 2017-05-23 VITALS — Ht 67.0 in | Wt 240.0 lb

## 2017-05-23 DIAGNOSIS — Z800 Family history of malignant neoplasm of digestive organs: Secondary | ICD-10-CM

## 2017-05-23 MED ORDER — SUPREP BOWEL PREP KIT 17.5-3.13-1.6 GM/177ML PO SOLN: 1.0000 | Freq: Once | ORAL | 0 refills | Status: AC

## 2017-05-23 NOTE — Progress Notes (Signed)
No allergies to eggs or soy No past problems with anesthesia No diet meds No home oxygen  Declined emmi 

## 2017-05-26 ENCOUNTER — Encounter (HOSPITAL_COMMUNITY): Payer: Self-pay

## 2017-05-27 ENCOUNTER — Other Ambulatory Visit: Payer: Medicare HMO

## 2017-05-30 ENCOUNTER — Ambulatory Visit
Admission: RE | Admit: 2017-05-30 | Discharge: 2017-05-30 | Disposition: A | Discharge status: Home / Self Care | Payer: Medicare HMO | Source: Ambulatory Visit | Attending: Family Medicine | Admitting: Family Medicine

## 2017-05-30 ENCOUNTER — Other Ambulatory Visit: Payer: Self-pay | Admitting: Family Medicine

## 2017-05-30 DIAGNOSIS — R928 Other abnormal and inconclusive findings on diagnostic imaging of breast: Secondary | ICD-10-CM

## 2017-05-30 DIAGNOSIS — N6489 Other specified disorders of breast: Secondary | ICD-10-CM | POA: Diagnosis not present

## 2017-05-30 DIAGNOSIS — N6320 Unspecified lump in the left breast, unspecified quadrant: Secondary | ICD-10-CM

## 2017-05-30 DIAGNOSIS — R599 Enlarged lymph nodes, unspecified: Secondary | ICD-10-CM

## 2017-06-02 ENCOUNTER — Telehealth: Payer: Self-pay | Admitting: Gastroenterology

## 2017-06-02 NOTE — Telephone Encounter (Signed)
ok 

## 2017-06-02 NOTE — Telephone Encounter (Signed)
Patient is coughing and doesn't feel well. She wants to move her screening colonoscopy to a later date. Moved her procedure to 07/22/17. She will contact her primary about the cough today.

## 2017-06-06 ENCOUNTER — Ambulatory Visit
Admission: RE | Admit: 2017-06-06 | Discharge: 2017-06-06 | Disposition: A | Discharge status: Home / Self Care | Payer: Medicare HMO | Source: Ambulatory Visit | Attending: Family Medicine | Admitting: Family Medicine

## 2017-06-06 ENCOUNTER — Other Ambulatory Visit: Payer: Self-pay | Admitting: Family Medicine

## 2017-06-06 DIAGNOSIS — N6002 Solitary cyst of left breast: Secondary | ICD-10-CM

## 2017-06-06 DIAGNOSIS — N6320 Unspecified lump in the left breast, unspecified quadrant: Secondary | ICD-10-CM

## 2017-06-06 DIAGNOSIS — N6323 Unspecified lump in the left breast, lower outer quadrant: Secondary | ICD-10-CM | POA: Diagnosis not present

## 2017-06-06 DIAGNOSIS — R590 Localized enlarged lymph nodes: Secondary | ICD-10-CM | POA: Diagnosis not present

## 2017-06-06 DIAGNOSIS — Z171 Estrogen receptor negative status [ER-]: Secondary | ICD-10-CM | POA: Diagnosis not present

## 2017-06-06 DIAGNOSIS — C50512 Malignant neoplasm of lower-outer quadrant of left female breast: Secondary | ICD-10-CM | POA: Diagnosis not present

## 2017-06-06 DIAGNOSIS — R599 Enlarged lymph nodes, unspecified: Secondary | ICD-10-CM

## 2017-06-06 HISTORY — PX: BREAST CYST ASPIRATION: SHX578

## 2017-06-06 HISTORY — PX: BREAST BIOPSY: SHX20

## 2017-06-10 ENCOUNTER — Encounter: Payer: Medicare HMO | Admitting: Certified Nurse Midwife

## 2017-06-11 ENCOUNTER — Encounter: Payer: Medicare HMO | Admitting: Obstetrics and Gynecology

## 2017-06-11 ENCOUNTER — Encounter: Payer: Self-pay | Admitting: *Deleted

## 2017-06-11 ENCOUNTER — Telehealth: Payer: Self-pay | Admitting: Hematology

## 2017-06-11 DIAGNOSIS — C50512 Malignant neoplasm of lower-outer quadrant of left female breast: Secondary | ICD-10-CM

## 2017-06-11 DIAGNOSIS — Z171 Estrogen receptor negative status [ER-]: Principal | ICD-10-CM

## 2017-06-11 HISTORY — DX: Malignant neoplasm of lower-outer quadrant of left female breast: C50.512

## 2017-06-11 HISTORY — DX: Malignant neoplasm of lower-outer quadrant of left female breast: Z17.1

## 2017-06-11 NOTE — Telephone Encounter (Signed)
Spoke with patient to confirm morning [facility] appointment for November 7, patient wanted paper work mailed and emailed to her.

## 2017-06-16 NOTE — Progress Notes (Signed)
[place]  [phone] [phone] [place] Note   Patient Care Team: Girtha Rm, NP-C as PCP - General (Family Medicine) 06/18/2017  CHIEF COMPLAINTS/PURPOSE OF CONSULTATION:  Malignant neoplasm of lower-outer quadrant of left breast of female, triple negative    Oncology History   Cancer Staging Malignant neoplasm of lower-outer quadrant of left breast of female, estrogen receptor negative ([place]) Staging form: Breast, AJCC 8th Edition - Clinical stage from 06/06/2017: Stage IB (cT1c, cN0, cM0, G3, ER: Negative, PR: Negative, HER2: Negative) - Signed by [name], MD on 06/15/2017       Malignant neoplasm of lower-outer quadrant of left breast of female, estrogen receptor negative ([place])   05/30/2017 Mammogram    Diagnostic mammo and US IMPRESSION: 1. Highly suspicious 1.4 cm mass in the slightly lower slightly outer left breast -tissue sampling recommended. 2. Indeterminate 0.5 mm mass in the slightly lower slightly outer left breast -tissue sampling recommended. 3. At least 2 left axillary lymph nodes with borderline cortical thickness.      06/06/2017 Initial Biopsy    Diagnosis 1. Breast, left, needle core biopsy, 5:30 o'clock - INVASIVE DUCTAL CARCINOMA, G3 2. Lymph node, needle/core biopsy, left axillary - NO CARCINOMA IDENTIFIED IN ONE LYMPH NODE (0/1)      06/06/2017 Initial Diagnosis    Malignant neoplasm of lower-outer quadrant of left breast of female, estrogen receptor negative ([place])      06/06/2017 Receptors her2    Estrogen Receptor: 0%, NEGATIVE Progesterone Receptor: 0%, NEGATIVE Proliferation Marker Ki67: 70%       HISTORY OF PRESENTING ILLNESS: 06/18/17 Kristin Ward 61 y.o. female is here because of newly diagnosed left breast cancer. She presents to Breast Clinic today accompanied by her daughter.   She found this mass by screening mammogram.  She denies palpable breast mass before  biopsy.  She thinks her current palpable mass is a keloid post biopsy.  She denies any other new symptoms.  No change of her energy level, weight, or appetite.  In the past she had HTN, possible COPD, and her cousin had breast cancer and her aunt had colon cancer. She quit smoking after 23 years She smoked a pack a week. She is can see moderately and has had cornea surgery. She walks with a cane. She had retinitis pigmentosa She has medication for this. She has CHF and sees a cardiologist and is on medication, Norvasc and coreg. She had lung nodules last year and she had it monitored.    GYN HISTORY  Menarchal: 11 LMP: 2015 Contraceptive: yes HRT: No G2P2: one son and one daughter    MEDICAL HISTORY:  Past Medical History:  Diagnosis Date  . Abnormal myocardial perfusion study 03/15/2016   EF 15%, severe LV systolic dysfcn, global hypokinesis and inferior akinesis. dilated LV  . Abnormal x-ray of lungs with single pulmonary nodule 03/15/2016   per records from [place]   . Blind   . Cholelithiasis 05/19/2017   On CT  . COPD (chronic obstructive pulmonary disease) ([place])   . Coronary artery calcification seen on CAT scan 05/19/2017  . Dilated idiopathic cardiomyopathy ([place]) 12/2015   EF 15-20%. Diagnosed in [facility]  . Headache   . Hypertension   . Malignant neoplasm of lower-outer quadrant of left breast of female, estrogen receptor negative ([place]) 06/11/2017  . Retinitis pigmentosa   . Solitary pulmonary nodule on lung CT 03/16/2016   per records from [place]  . Uveitis  SURGICAL HISTORY: Past Surgical History:  Procedure Laterality Date  . brain cyst removed     to help with headaches per patient   . NASAL TURBINATE REDUCTION    . TRANSTHORACIC ECHOCARDIOGRAM  12/2015   [facility] May 2017: Mild concentric LVH. Global hypokinesis. GR daily. EF 18% severe LA dilation. Mitral annular dilatation with papillary muscle dysfunction and moderate MR.  Dilated IVC consistent with elevated RAP.    SOCIAL HISTORY: Social History   Socioeconomic History  . Marital status: Widowed    Spouse name: Not on file  . Number of children: Not on file  . Years of education: Not on file  . Highest education level: Not on file  Social Needs  . Financial resource strain: Not on file  . Food insecurity - worry: Not on file  . Food insecurity - inability: Not on file  . Transportation needs - medical: Not on file  . Transportation needs - non-medical: Not on file  Occupational History  . Not on file  Tobacco Use  . Smoking status: Former Smoker    Packs/day: 0.25    Years: 23.00    Pack years: 5.75    Types: Cigarettes    Last attempt to quit: 10/09/2015    Years since quitting: 1.6  . Smokeless tobacco: Never Used  Substance and Sexual Activity  . Alcohol use: No  . Drug use: No  . Sexual activity: No  Other Topics Concern  . Not on file  Social History Narrative   She is a widowed mother of 2 (daughter and son).   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   She moved to [place] apparently back in 2016, but was visiting family in [place] and was diagnosed with Cardiomyopathy.   She is accompanied by her daughter. She walks roughly 1-2 miles a day.    FAMILY HISTORY: Family History  Problem Relation Age of Onset  . Colon cancer Mother 62  . Heart attack Father   . Cancer Cousin        pancreatic cancer  . Cancer Cousin        breast cancer     ALLERGIES:  is allergic to latex; tylenol with codeine #3 [acetaminophen-codeine]; and penicillins.  MEDICATIONS:  Current Outpatient Medications  Medication Sig Dispense Refill  . amLODipine (NORVASC) 5 MG tablet Take 1 tablet (5 mg total) by mouth daily. 30 tablet 5  . aspirin 81 MG chewable tablet Chew 81 mg by mouth daily.    . carvedilol (COREG) 12.5 MG tablet Take 1 tablet (12.5 mg total) by mouth 2 (two) times daily with a meal. 60 tablet 5  . furosemide (LASIX) 40 MG  tablet Take 1 tablet (40 mg total) by mouth daily. (Patient taking differently: Take 40-80 mg by mouth daily. TAKE 2 TABLETS IF WEIGHT IS 240 LBS) 30 tablet 5  . sacubitril-valsartan (ENTRESTO) 24-26 MG Take 1 tablet by mouth 2 (two) times daily. 60 tablet 5  . spironolactone (ALDACTONE) 25 MG tablet Take 1 tablet (25 mg total) by mouth daily. 30 tablet 5  . valACYclovir (VALTREX) 1000 MG tablet Take 1 tablet (1,000 mg total) by mouth daily. (Patient not taking: Reported on 06/18/2017) 7 tablet 6   No current facility-administered medications for this visit.      REVIEW OF SYSTEMS:   Constitutional: Denies fevers, chills or abnormal night sweats Eyes: Denies blurriness of vision, double vision or watery eyes Ears, nose, mouth, throat, and face: Denies  mucositis or sore throat Respiratory: Denies cough, dyspnea or wheezes Cardiovascular: Denies palpitation, chest discomfort or lower extremity swelling Gastrointestinal:  Denies nausea, heartburn or change in bowel habits Skin: Denies abnormal skin rashes Lymphatics: Denies new lymphadenopathy or easy bruising Neurological:Denies numbness, tingling or new weaknesses Behavioral/Psych: Mood is stable, no new changes  All other systems were reviewed with the patient and are negative Breast: (+) she feels lump in left breast after biopsy at site.   PHYSICAL EXAMINATION: ECOG PERFORMANCE STATUS: 1 - Symptomatic but completely ambulatory  Vitals:   06/18/17 0916  BP: 127/82  Pulse: 68  Resp: 18  Temp: 97.9 F (36.6 C)  SpO2: 99%   Filed Weights   06/18/17 0916  Weight: 239 lb 9.6 oz (108.7 kg)    GENERAL:alert, no distress and comfortable SKIN: skin color, texture, turgor are normal, no rashes or significant lesions EYES: normal, conjunctiva are pink and non-injected, sclera clear OROPHARYNX:no exudate, no erythema and lips, buccal mucosa, and tongue normal  NECK: supple, thyroid normal size, non-tender, without nodularity LYMPH:   no palpable lymphadenopathy in the cervical, axillary or inguinal LUNGS: clear to auscultation and percussion with normal breathing effort HEART: regular rate & rhythm and no murmurs and no lower extremity edema ABDOMEN:abdomen soft, non-tender and normal bowel sounds Musculoskeletal:no cyanosis of digits and no clubbing  PSYCH: alert & oriented x 3 with fluent speech NEURO: no focal motor/sensory deficits Breast: (+) 1.0 cm lump in 5-5:30 position of left breast, no other palpable breast mass or axillary adenopathy.  No skin change or nipple discharge or inversion.  LABORATORY DATA:  I have reviewed the data as listed CBC Latest Ref Rng & Units 06/18/2017 03/20/2017  WBC 3.9 - 10.3 10e3/uL 6.6 7.5  Hemoglobin 11.6 - 15.9 g/dL 12.9 12.7  Hematocrit 34.8 - 46.6 % 38.4 37.9  Platelets 145 - 400 10e3/uL 292 362    CMP Latest Ref Rng & Units 06/18/2017 03/20/2017  Glucose 70 - 140 mg/dl 112 99  BUN 7.0 - 26.0 mg/dL 13.4 13  Creatinine 0.6 - 1.1 mg/dL 0.8 0.78  Sodium 136 - 145 mEq/L 142 141  Potassium 3.5 - 5.1 mEq/L 4.3 4.2  Chloride 98 - 110 mmol/L - 108  CO2 22 - 29 mEq/L 23 22  Calcium 8.4 - 10.4 mg/dL 9.4 9.4  Total Protein 6.4 - 8.3 g/dL 7.9 7.3  Total Bilirubin 0.20 - 1.20 mg/dL 0.48 0.4  Alkaline Phos 40 - 150 U/L 104 109  AST 5 - 34 U/L 22 24  ALT 0 - 55 U/L 30 40(H)     PATHOLOGY  Diagnosis 1. Breast, left, needle core biopsy, 5:30 o'clock - INVASIVE DUCTAL CARCINOMA - SEE COMMENT 2. Lymph node, needle/core biopsy, left axillary - NO CARCINOMA IDENTIFIED IN ONE LYMPH NODE (0/1) - SEE COMMENT Microscopic Comment 1. Based on the biopsy, the carcinoma appears Nottingham grade 3 of 3 and measures 1.2 cm in greatest linear extent. Prognostic markers (ER/PR/ki-67/HER2-FISH) are pending and will be reported in an addendum. Dr. Lyndon Code has reviewed the case and agrees with above diagnosis. These results were called to [facility] on June 10, 2017. 2. Deeper  levels were examined. Cytokeratin AE1/3 is negative supporting the above diagnosis. 1. PROGNOSTIC INDICATORS Results: IMMUNOHISTOCHEMICAL AND MORPHOMETRIC ANALYSIS PERFORMED MANUALLY Estrogen Receptor: 0%, NEGATIVE Progesterone Receptor: 0%, NEGATIVE Proliferation Marker Ki67: 70%   RADIOGRAPHIC STUDIES: I have personally reviewed the radiological images as listed and agreed with the findings in the report. Dg  Bone Density  Result Date: 05/20/2017 EXAM: DUAL X-RAY ABSORPTIOMETRY (DXA) FOR BONE MINERAL DENSITY IMPRESSION: Referring Physician:  Girtha Rm PATIENT: Name: Kristin Ward, Kristin Ward Patient ID: [id number] Birth Date: Jul 18, 1956 Height: 66.0 in. Sex: Female Measured: 05/20/2017 Weight: 226.0 lbs. Indications: Estrogen Deficient, Postmenopausal Fractures: None Treatments: None ASSESSMENT: The BMD measured at Femur Total Left is 1.049 g/cm2 with a T-score of 0.3. This patient is considered normal according to [facility]) criteria. Site Region Measured Date Measured Age YA BMD Significant CHANGE T-score DualFemur Total Left [age]    [age]         0.3     1.049 g/cm2 AP Spine  L1-L4      7[birth year]/[age]    [age]         3.3     1.601 g/cm2 World Health [facility]) criteria for post-menopausal, Caucasian Women: Normal       T-score at or above -1 SD Osteopenia   T-score between -1 and -2.5 SD Osteoporosis T-score at or below -2.5 SD RECOMMENDATION: [place] recommends that FDA-approved medical therapies be considered in postmenopausal women and men age 76 or older with a: 1. Hip or vertebral (clinical or morphometric) fracture. 2. T-score of <-2.5 at the spine or hip. 3. Ten-year fracture probability by FRAX of 3% or greater for hip fracture or 20% or greater for major osteoporotic fracture. All treatment decisions require clinical judgment and consideration of individual patient factors, including patient preferences, co-morbidities, previous drug use,  risk factors not captured in the FRAX model (e.g. falls, vitamin D deficiency, increased bone turnover, interval significant decline in bone density) and possible under - or over-estimation of fracture risk by FRAX. All patients should ensure an adequate intake of dietary calcium (1200 mg/d) and vitamin D (800 IU daily) unless contraindicated. FOLLOW-UP: People with diagnosed cases of osteoporosis or at high risk for fracture should have regular bone mineral density tests. For patients eligible for Medicare, routine testing is allowed once every 2 years. The testing frequency can be increased to one year for patients who have rapidly progressing disease, those who are receiving or discontinuing medical therapy to restore bone mass, or have additional risk factors. I have reviewed this report, and agree with the above findings. [facility] Electronically Signed   By: Rolm Baptise M.D.   On: 05/20/2017 08:35   US Breast Ltd Uni Left Inc Axilla  Result Date: 05/30/2017 CLINICAL DATA:  61 year old female for further evaluation of possible left breast mass on screening mammogram. EXAM: 2D DIGITAL DIAGNOSTIC LEFT MAMMOGRAM WITH CAD AND ADJUNCT TOMO ULTRASOUND LEFT BREAST COMPARISON:  Previous exam(s). ACR Breast Density Category b: There are scattered areas of fibroglandular density. FINDINGS: 2D and 3D spot compression views of the left breast demonstrate a 1.4 cm irregular mass within the posterior slightly upper outer left breast. Mammographic images were processed with CAD. Targeted ultrasound is performed, showing the following: A 1 x 1.1 x 1.4 cm irregular hypoechoic mass at the 5:30 position of the left breast 3 cm from the nipple. A 0.5 x 0.4 x 0.5 cm slightly irregular oval hypoechoic mass at the 5:30 position of the left breast 2 cm from the nipple. The 2 above masses are separated by a distance of 1.2 cm. At least 2 left axillary lymph nodes with upper limits of normal cortical thickness noted.  IMPRESSION: 1. Highly suspicious 1.4 cm mass in the slightly lower slightly outer left breast -tissue sampling recommended. 2. Indeterminate 0.5  mm mass in the slightly lower slightly outer left breast -tissue sampling recommended. 3. At least 2 left axillary lymph nodes with borderline cortical thickness. Tissue sampling of 1 of these nodes recommended. RECOMMENDATION: Ultrasound-guided biopsy of both left breast masses and 1 of the borderline left axillary lymph nodes. I have discussed the findings and recommendations with the patient. Results were also provided in writing at the conclusion of the visit. If applicable, a reminder letter will be sent to the patient regarding the next appointment. BI-RADS CATEGORY  5: Highly suggestive of malignancy. Electronically Signed   By: Margarette Canada M.D.   On: 05/30/2017 11:49   Mm Diag Breast Tomo Uni Left  Result Date: 05/30/2017 CLINICAL DATA:  61 year old female for further evaluation of possible left breast mass on screening mammogram. EXAM: 2D DIGITAL DIAGNOSTIC LEFT MAMMOGRAM WITH CAD AND ADJUNCT TOMO ULTRASOUND LEFT BREAST COMPARISON:  Previous exam(s). ACR Breast Density Category b: There are scattered areas of fibroglandular density. FINDINGS: 2D and 3D spot compression views of the left breast demonstrate a 1.4 cm irregular mass within the posterior slightly upper outer left breast. Mammographic images were processed with CAD. Targeted ultrasound is performed, showing the following: A 1 x 1.1 x 1.4 cm irregular hypoechoic mass at the 5:30 position of the left breast 3 cm from the nipple. A 0.5 x 0.4 x 0.5 cm slightly irregular oval hypoechoic mass at the 5:30 position of the left breast 2 cm from the nipple. The 2 above masses are separated by a distance of 1.2 cm. At least 2 left axillary lymph nodes with upper limits of normal cortical thickness noted. IMPRESSION: 1. Highly suspicious 1.4 cm mass in the slightly lower slightly outer left breast -tissue  sampling recommended. 2. Indeterminate 0.5 mm mass in the slightly lower slightly outer left breast -tissue sampling recommended. 3. At least 2 left axillary lymph nodes with borderline cortical thickness. Tissue sampling of 1 of these nodes recommended. RECOMMENDATION: Ultrasound-guided biopsy of both left breast masses and 1 of the borderline left axillary lymph nodes. I have discussed the findings and recommendations with the patient. Results were also provided in writing at the conclusion of the visit. If applicable, a reminder letter will be sent to the patient regarding the next appointment. BI-RADS CATEGORY  5: Highly suggestive of malignancy. Electronically Signed   By: Margarette Canada M.D.   On: 05/30/2017 11:49   Mm Screening Breast Tomo Bilateral  Result Date: 05/20/2017 CLINICAL DATA:  Screening. EXAM: 2D DIGITAL SCREENING BILATERAL MAMMOGRAM WITH CAD AND ADJUNCT TOMO COMPARISON:  Previous exam(s). ACR Breast Density Category c: The breast tissue is heterogeneously dense, which may obscure small masses. FINDINGS: In the left breast, possible asymmetries warrant further evaluation. In the right breast, no findings suspicious for malignancy. Images were processed with CAD. IMPRESSION: Further evaluation is suggested for possible asymmetries in the left breast. RECOMMENDATION: Diagnostic mammogram and possibly ultrasound of the left breast. (Code:[id number]-[id number]) The patient will be contacted regarding the findings, and additional imaging will be scheduled. BI-RADS CATEGORY  0: Incomplete. Need additional imaging evaluation and/or prior mammograms for comparison. Electronically Signed   By: Fidela Salisbury M.D.   On: 05/20/2017 16:40   [place] Axillary Node Core Biopsy Left  Addendum Date: 06/16/2017   ADDENDUM REPORT: 06/10/2017 13:50 ADDENDUM: Pathology revealed GRADE III INVASIVE DUCTAL CARCINOMA of the Left breast, 5:30 o'clock. NO CARCINOMA IDENTIFIED in the Left axillary lymph node. This was found to be  concordant by Dr. Ammie Ferrier. Pathology results  were discussed with the patient by telephone. The patient reported doing well after the biopsies with tenderness at the sites. Post biopsy instructions and care were reviewed and questions were answered. The patient was encouraged to call [facility] for any additional concerns. The patient was referred to [facility] on June 18, 2017. Pathology results reported by Terie Purser, RN on 06/10/2017. Electronically Signed   By: Ammie Ferrier M.D.   On: 06/10/2017 13:50   Result Date: 06/16/2017 CLINICAL DATA:  61 year old female presenting for ultrasound-guided biopsy of 2 left breast masses and left axillary lymph node. EXAM: ULTRASOUND GUIDED LEFT BREAST CORE NEEDLE BIOPSY COMPARISON:  Previous exam(s). FINDINGS: I met with the patient and we discussed the procedure of ultrasound-guided biopsy, including benefits and alternatives. We discussed the high likelihood of a successful procedure. We discussed the risks of the procedure, including infection, bleeding, tissue injury, clip migration, and inadequate sampling. Informed written consent was given. The usual time-out protocol was performed immediately prior to the procedure. Lesion quadrant: Lower outer quadrant Using sterile technique and 1% Lidocaine as local anesthetic, under direct ultrasound visualization, a 14 gauge spring-loaded device was used to perform biopsy of a mass the left breast at 5:30 using an inferior approach. At the conclusion of the procedure a ribbon shaped tissue marker clip was deployed into the biopsy cavity. ----- Next, I attempted aspiration of the small mass at 5:30, 2 cm from the nipple using sterile technique, a 1% lidocaine local anesthetic and a 21 gauge needle. The needle aspirated to complete resolution, therefore biopsy was not necessary. ----- Lesion quadrant: Left  axilla Using sterile technique and 1% Lidocaine as local anesthetic, under direct ultrasound visualization, a 14 gauge spring-loaded device was used to perform biopsy of a lymph node in the left axilla using an inferior approach. At the conclusion of the procedure a HydroMARK tissue marker clip was deployed into the biopsy cavity. Follow up 2 view mammogram was performed and dictated separately. IMPRESSION: 1. Ultrasound guided biopsy of a left breast mass at 5:30. No apparent complications. 2. Ultrasound-guided aspiration of a left breast cyst at 5:30. No apparent complications. 3. Ultrasound-guided biopsy of a left axillary lymph node. No apparent complications. Electronically Signed: By: Ammie Ferrier M.D. On: 06/06/2017 14:36   Mm Clip Placement Left  Result Date: 06/06/2017 CLINICAL DATA:  Post biopsy mammogram of the left breast for clip placement. EXAM: DIAGNOSTIC LEFT MAMMOGRAM POST ULTRASOUND BIOPSY COMPARISON:  Previous exam(s). FINDINGS: Mammographic images were obtained following ultrasound guided biopsy of a mass in the left breast and a left axillary lymph node. The ribbon shaped biopsy marking clip within the mass mass in the lower slightly outer left breast is well positioned. The left axillary biopsy marking clip cannot be included in the field of view due to the deep position of the clip lymph node within the axilla. IMPRESSION: 1. Appropriate positioning of the ribbon shaped biopsy marking clip within the left axillary lymph node. 2.  The left axillary clip is not included within the field of view. Final Assessment: Post Procedure Mammograms for Marker Placement Electronically Signed   By: Ammie Ferrier M.D.   On: 06/06/2017 14:43   [place] Lt Breast Bx W Loc Dev 1st Lesion Img Bx Spec US Guide  Addendum Date: 06/16/2017   ADDENDUM REPORT: 06/10/2017 13:50 ADDENDUM: Pathology revealed GRADE III INVASIVE DUCTAL CARCINOMA of the Left breast, 5:30 o'clock. NO  CARCINOMA IDENTIFIED in the  Left axillary lymph node. This was found to be concordant by Dr. Ammie Ferrier. Pathology results were discussed with the patient by telephone. The patient reported doing well after the biopsies with tenderness at the sites. Post biopsy instructions and care were reviewed and questions were answered. The patient was encouraged to call [facility] for any additional concerns. The patient was referred to [facility] on June 18, 2017. Pathology results reported by Terie Purser, RN on 06/10/2017. Electronically Signed   By: Ammie Ferrier M.D.   On: 06/10/2017 13:50   Result Date: 06/16/2017 CLINICAL DATA:  61 year old female presenting for ultrasound-guided biopsy of 2 left breast masses and left axillary lymph node. EXAM: ULTRASOUND GUIDED LEFT BREAST CORE NEEDLE BIOPSY COMPARISON:  Previous exam(s). FINDINGS: I met with the patient and we discussed the procedure of ultrasound-guided biopsy, including benefits and alternatives. We discussed the high likelihood of a successful procedure. We discussed the risks of the procedure, including infection, bleeding, tissue injury, clip migration, and inadequate sampling. Informed written consent was given. The usual time-out protocol was performed immediately prior to the procedure. Lesion quadrant: Lower outer quadrant Using sterile technique and 1% Lidocaine as local anesthetic, under direct ultrasound visualization, a 14 gauge spring-loaded device was used to perform biopsy of a mass the left breast at 5:30 using an inferior approach. At the conclusion of the procedure a ribbon shaped tissue marker clip was deployed into the biopsy cavity. ----- Next, I attempted aspiration of the small mass at 5:30, 2 cm from the nipple using sterile technique, a 1% lidocaine local anesthetic and a 21 gauge needle. The needle aspirated to complete resolution, therefore biopsy  was not necessary. ----- Lesion quadrant: Left axilla Using sterile technique and 1% Lidocaine as local anesthetic, under direct ultrasound visualization, a 14 gauge spring-loaded device was used to perform biopsy of a lymph node in the left axilla using an inferior approach. At the conclusion of the procedure a HydroMARK tissue marker clip was deployed into the biopsy cavity. Follow up 2 view mammogram was performed and dictated separately. IMPRESSION: 1. Ultrasound guided biopsy of a left breast mass at 5:30. No apparent complications. 2. Ultrasound-guided aspiration of a left breast cyst at 5:30. No apparent complications. 3. Ultrasound-guided biopsy of a left axillary lymph node. No apparent complications. Electronically Signed: By: Ammie Ferrier M.D. On: 06/06/2017 14:36   US Breast Aspiration Left  Addendum Date: 06/16/2017   ADDENDUM REPORT: 06/10/2017 13:50 ADDENDUM: Pathology revealed GRADE III INVASIVE DUCTAL CARCINOMA of the Left breast, 5:30 o'clock. NO CARCINOMA IDENTIFIED in the Left axillary lymph node. This was found to be concordant by Dr. Ammie Ferrier. Pathology results were discussed with the patient by telephone. The patient reported doing well after the biopsies with tenderness at the sites. Post biopsy instructions and care were reviewed and questions were answered. The patient was encouraged to call [facility] for any additional concerns. The patient was referred to [facility] on June 18, 2017. Pathology results reported by Terie Purser, RN on 06/10/2017. Electronically Signed   By: Ammie Ferrier M.D.   On: 06/10/2017 13:50   Result Date: 06/16/2017 CLINICAL DATA:  61 year old female presenting for ultrasound-guided biopsy of 2 left breast masses and left axillary lymph node. EXAM: ULTRASOUND GUIDED LEFT BREAST CORE NEEDLE BIOPSY COMPARISON:  Previous exam(s).  FINDINGS: I met with the patient and we discussed the procedure of ultrasound-guided biopsy, including benefits and alternatives. We discussed the high likelihood of a successful procedure. We discussed the risks of the procedure, including infection, bleeding, tissue injury, clip migration, and inadequate sampling. Informed written consent was given. The usual time-out protocol was performed immediately prior to the procedure. Lesion quadrant: Lower outer quadrant Using sterile technique and 1% Lidocaine as local anesthetic, under direct ultrasound visualization, a 14 gauge spring-loaded device was used to perform biopsy of a mass the left breast at 5:30 using an inferior approach. At the conclusion of the procedure a ribbon shaped tissue marker clip was deployed into the biopsy cavity. ----- Next, I attempted aspiration of the small mass at 5:30, 2 cm from the nipple using sterile technique, a 1% lidocaine local anesthetic and a 21 gauge needle. The needle aspirated to complete resolution, therefore biopsy was not necessary. ----- Lesion quadrant: Left axilla Using sterile technique and 1% Lidocaine as local anesthetic, under direct ultrasound visualization, a 14 gauge spring-loaded device was used to perform biopsy of a lymph node in the left axilla using an inferior approach. At the conclusion of the procedure a HydroMARK tissue marker clip was deployed into the biopsy cavity. Follow up 2 view mammogram was performed and dictated separately. IMPRESSION: 1. Ultrasound guided biopsy of a left breast mass at 5:30. No apparent complications. 2. Ultrasound-guided aspiration of a left breast cyst at 5:30. No apparent complications. 3. Ultrasound-guided biopsy of a left axillary lymph node. No apparent complications. Electronically Signed: By: Ammie Ferrier M.D. On: 06/06/2017 14:36    ASSESSMENT & PLAN:  Rever Kristin Ward is a 61 y.o. female with a history of HTN, COPD, retinitis pigmentosa, and CHF with EF  45-50%, presented with screening discovered left breast cancer.   1. 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  -Given her early stage of the breast cancer, she would likely be a candidate for lumpectomy and sentinel lymph node biopsy.  She will discuss this further with Dr. Barry Dienes today.  -We reviewed the risk of cancer recurrence after complete surgical resection, which is is based on the stage and biology of cancer. She has early stage but very aggressive disease.  She has high risk for recurrence due to triple negative disease  -The standard of care is neoadjuvant or adjuvant chemotherapy and radiation to reduce her risk of recurrence.  Given the small size of her tumor and node-negative disease, I think adjuvant chemotherapy is reasonable.  We discussed rice adjuvant chemotherapy regimen, due to her cardiomyopathy, she may not be a candidate for Adriamycin.  Patient is also reluctant to take chemotherapy.  I suggest moderate intense chemo regiment docetaxel and Cytoxan every 2 weeks, for 4-6 cycles, due to her comorbidity. I explained what this chemo would entail and side effects in great detail. If node negative she will get CT every 3 weeks for 4 treatments, if node positive, I will recommend a total of 6 cycles. -She was also seen by radiation oncologist Dr. Lisbeth Renshaw today.  She will likely need adjuvant breast radiation -Given her ER/PR negative disease, her cancer is not sensitive to hormones and she will not need anti-estrogen therapy.  -I addressed her concern with chemotherapy and discussed what benefits this will give to reduce her risk of  recurrence. After a lengthy discussion she agreed to chemotherapy after surgery. She will get port placed with surgery by Dr. Barry Dienes.  -F/u in 2 weeks post surgery. Will discuss UPBEAT study at  next visit.    2. Genetics -Due to her age and triple negative breast cancer, and positive family history, she will be referred to genetics  3.  Chronic combined systolic and diastolic heart failure, EF 45-50% -Follow-up with her cardiologist Dr. Ellyn Hack   4. HTN, COPD, retinitis pigmentosa with limited vision -Follow-up with primary care physician  PLAN:  -She will proceed with left breast lumpectomy and sentinel lymph node biopsy port placement by Dr. Barry Dienes in near future  -genetic referral -F/u 2 weeks post surgery to finalize her adjuvant chemotherapy and will discuss [place] clinic study   No orders of the defined types were placed in this encounter.   All questions were answered. The patient knows to call the clinic with any problems, questions or concerns. I spent 55 minutes counseling the patient face to face. The total time spent in the appointment was 60 minutes and more than 50% was on counseling.     [name], MD 06/18/2017 11:18 AM   This document serves as a record of services personally performed by [name], MD. It was created on her behalf by Kristin Ward, a trained medical scribe. The creation of this record is based on the scribe's personal observations and the provider's statements to them.     I have reviewed the above documentation for accuracy and completeness, and I agree with the above.

## 2017-06-18 ENCOUNTER — Ambulatory Visit: Payer: Medicare HMO | Admitting: Cardiology

## 2017-06-18 ENCOUNTER — Other Ambulatory Visit: Payer: Self-pay | Admitting: General Surgery

## 2017-06-18 ENCOUNTER — Encounter: Payer: Self-pay | Admitting: Hematology

## 2017-06-18 ENCOUNTER — Ambulatory Visit: Payer: Medicare HMO | Attending: General Surgery | Admitting: Physical Therapy

## 2017-06-18 ENCOUNTER — Ambulatory Visit (HOSPITAL_BASED_OUTPATIENT_CLINIC_OR_DEPARTMENT_OTHER): Payer: Medicare HMO | Admitting: Hematology

## 2017-06-18 ENCOUNTER — Encounter: Payer: Self-pay | Admitting: Physical Therapy

## 2017-06-18 ENCOUNTER — Other Ambulatory Visit (HOSPITAL_BASED_OUTPATIENT_CLINIC_OR_DEPARTMENT_OTHER): Payer: Medicare HMO

## 2017-06-18 ENCOUNTER — Ambulatory Visit
Admission: RE | Admit: 2017-06-18 | Discharge: 2017-06-18 | Disposition: A | Discharge status: Home / Self Care | Payer: Medicare HMO | Source: Ambulatory Visit | Attending: Radiation Oncology | Admitting: Radiation Oncology

## 2017-06-18 VITALS — BP 127/82 | HR 68 | Temp 97.9°F | Resp 18 | Ht 67.0 in | Wt 239.6 lb

## 2017-06-18 DIAGNOSIS — Z803 Family history of malignant neoplasm of breast: Secondary | ICD-10-CM | POA: Diagnosis not present

## 2017-06-18 DIAGNOSIS — J449 Chronic obstructive pulmonary disease, unspecified: Secondary | ICD-10-CM

## 2017-06-18 DIAGNOSIS — C50512 Malignant neoplasm of lower-outer quadrant of left female breast: Secondary | ICD-10-CM

## 2017-06-18 DIAGNOSIS — R293 Abnormal posture: Secondary | ICD-10-CM | POA: Insufficient documentation

## 2017-06-18 DIAGNOSIS — Z171 Estrogen receptor negative status [ER-]: Secondary | ICD-10-CM | POA: Diagnosis not present

## 2017-06-18 DIAGNOSIS — I10 Essential (primary) hypertension: Secondary | ICD-10-CM | POA: Diagnosis not present

## 2017-06-18 DIAGNOSIS — Z800 Family history of malignant neoplasm of digestive organs: Secondary | ICD-10-CM | POA: Diagnosis not present

## 2017-06-18 LAB — CBC WITH DIFFERENTIAL/PLATELET
BASO%: 0.6 % (ref 0.0–2.0)
BASOS ABS: 0 10*3/uL (ref 0.0–0.1)
EOS ABS: 0.2 10*3/uL (ref 0.0–0.5)
EOS%: 2.9 % (ref 0.0–7.0)
HEMATOCRIT: 38.4 % (ref 34.8–46.6)
HEMOGLOBIN: 12.9 g/dL (ref 11.6–15.9)
LYMPH#: 2 10*3/uL (ref 0.9–3.3)
LYMPH%: 30.3 % (ref 14.0–49.7)
MCH: 30.5 pg (ref 25.1–34.0)
MCHC: 33.7 g/dL (ref 31.5–36.0)
MCV: 90.6 fL (ref 79.5–101.0)
MONO#: 0.5 10*3/uL (ref 0.1–0.9)
MONO%: 8.3 % (ref 0.0–14.0)
NEUT#: 3.8 10*3/uL (ref 1.5–6.5)
NEUT%: 57.9 % (ref 38.4–76.8)
Platelets: 292 10*3/uL (ref 145–400)
RBC: 4.23 10*6/uL (ref 3.70–5.45)
RDW: 13.8 % (ref 11.2–14.5)
WBC: 6.6 10*3/uL (ref 3.9–10.3)

## 2017-06-18 LAB — COMPREHENSIVE METABOLIC PANEL
ALT: 30 U/L (ref 0–55)
ANION GAP: 8 meq/L (ref 3–11)
AST: 22 U/L (ref 5–34)
Albumin: 4 g/dL (ref 3.5–5.0)
Alkaline Phosphatase: 104 U/L (ref 40–150)
BUN: 13.4 mg/dL (ref 7.0–26.0)
CHLORIDE: 111 meq/L — AB (ref 98–109)
CO2: 23 mEq/L (ref 22–29)
Calcium: 9.4 mg/dL (ref 8.4–10.4)
Creatinine: 0.8 mg/dL (ref 0.6–1.1)
Glucose: 112 mg/dl (ref 70–140)
Potassium: 4.3 mEq/L (ref 3.5–5.1)
Sodium: 142 mEq/L (ref 136–145)
Total Bilirubin: 0.48 mg/dL (ref 0.20–1.20)
Total Protein: 7.9 g/dL (ref 6.4–8.3)

## 2017-06-18 NOTE — Patient Instructions (Signed)

## 2017-06-18 NOTE — Therapy (Signed)
[place], [place], [address] Phone: [phone]   Fax:  [phone]  Physical Therapy Evaluation  Patient Details  Name: Kristin Ward MRN: [id number] Date of Birth: Aug 08, 1956 Referring Provider: Dr. [name]   Encounter Date: 06/18/2017  PT End of Session - 06/18/17 1431    Visit Number  1    Number of Visits  2    Date for PT Re-Evaluation  08/13/17    PT Start Time  6073    PT Stop Time  1159    PT Time Calculation (min)  23 min    Activity Tolerance  Patient tolerated treatment well    Behavior During Therapy  [facility] for tasks assessed/performed       Past Medical History:  Diagnosis Date  . Abnormal myocardial perfusion study 03/15/2016   EF 15%, severe LV systolic dysfcn, global hypokinesis and inferior akinesis. dilated LV  . Abnormal x-ray of lungs with single pulmonary nodule 03/15/2016   per records from [place]   . Blind   . Cholelithiasis 05/19/2017   On CT  . COPD (chronic obstructive pulmonary disease) ([place])   . Coronary artery calcification seen on CAT scan 05/19/2017  . Dilated idiopathic cardiomyopathy ([place]) 12/2015   EF 15-20%. Diagnosed in [facility]  . Headache   . Hypertension   . Malignant neoplasm of lower-outer quadrant of left breast of female, estrogen receptor negative ([place]) 06/11/2017  . Retinitis pigmentosa   . Solitary pulmonary nodule on lung CT 03/16/2016   per records from [place]  . Uveitis     Past Surgical History:  Procedure Laterality Date  . brain cyst removed     to help with headaches per patient   . NASAL TURBINATE REDUCTION    . TRANSTHORACIC ECHOCARDIOGRAM  12/2015   [facility] May 2017: Mild concentric LVH. Global hypokinesis. GR daily. EF 18% severe LA dilation. Mitral annular dilatation with papillary muscle dysfunction and moderate MR. Dilated IVC consistent with elevated RAP.    There were no  vitals filed for this visit.   Subjective Assessment - 06/18/17 1354    Subjective  Patient reports she is here today to be seen by her medical team for her newly diagnosed left breast cancer.    Patient is accompained by:  Family member    Pertinent History  Patient was diagnosed on 05/20/17 with left triple negative breast cancer. It measures 1.4 cm and is located in the lower outer quadrant with a Ki67 of 90%. She is legally blind and has hypertension.    Patient Stated Goals  Reduce lymphedema risk and learn post op shoulder ROM HEP    Currently in Pain?  No/denies         [facility] PT Assessment - 06/18/17 0001      Assessment   Medical Diagnosis  Left breast cancer    Referring Provider  Dr. [name]    Onset Date/Surgical Date  05/20/17    Hand Dominance  Right    Prior Therapy  none      Precautions   Precautions  Other (comment)    Precaution Comments  active cancer      Restrictions   Weight Bearing Restrictions  No      Balance Screen   Has the patient fallen in the past 6 months  No    Has the patient had a decrease in activity level because of a fear of falling?   No  Is the patient reluctant to leave their home because of a fear of falling?   No      Home Environment   Living Environment  Private residence    Living Arrangements  Children 15 and 54 y.o. children   28 and 36 y.o. children   Available Help at Discharge  Family      Prior Function   Level of [place]  On disability    Leisure  She does some situps and pushups twice a week      Cognition   Overall Cognitive Status  Within Functional Limits for tasks assessed      Posture/Postural Control   Posture/Postural Control  Postural limitations    Postural Limitations  Rounded Shoulders;Forward head      ROM / Strength   AROM / PROM / Strength  AROM;Strength      AROM   AROM Assessment Site  Shoulder;Cervical    Right/Left Shoulder  Right;Left    Right Shoulder  Extension  58 Degrees    Right Shoulder Flexion  150 Degrees    Right Shoulder ABduction  155 Degrees    Right Shoulder Internal Rotation  50 Degrees    Right Shoulder External Rotation  88 Degrees    Left Shoulder Extension  53 Degrees    Left Shoulder Flexion  138 Degrees    Left Shoulder ABduction  143 Degrees    Left Shoulder Internal Rotation  50 Degrees    Left Shoulder External Rotation  75 Degrees    Cervical Flexion  WNL    Cervical Extension  50% limited    Cervical - Right Side Bend  WNL    Cervical - Left Side Bend  WNL    Cervical - Right Rotation  WNL    Cervical - Left Rotation  WNL      Strength   Overall Strength  Within functional limits for tasks performed        LYMPHEDEMA/ONCOLOGY QUESTIONNAIRE - 06/18/17 1429      Type   Cancer Type  Left breast cancer      Lymphedema Assessments   Lymphedema Assessments  Upper extremities      Right Upper Extremity Lymphedema   10 cm Proximal to Olecranon Process  37.5 cm    Olecranon Process  29.2 cm    10 cm Proximal to Ulnar Styloid Process  27.7 cm    Just Proximal to Ulnar Styloid Process  18.2 cm    Across Hand at [employer]  20.9 cm    At [place] of 2nd Digit  6.5 cm      Left Upper Extremity Lymphedema   10 cm Proximal to Olecranon Process  37 cm    Olecranon Process  28.8 cm    10 cm Proximal to Ulnar Styloid Process  25.5 cm    Just Proximal to Ulnar Styloid Process  17.9 cm    Across Hand at [employer]  19.6 cm    At [place] of 2nd Digit  6.3 cm          Objective measurements completed on examination: See above findings.            Patient was instructed today in a home exercise program today for post op shoulder range of motion. These included active assist shoulder flexion in sitting, scapular retraction, wall walking with shoulder abduction, and hands behind head external rotation.  She was encouraged to do  these twice a day, holding 3 seconds and repeating 5 times when permitted  by her physician.      PT Education - 06/18/17 1430    Education provided  Yes    Education Details  Lymphedema risk reduction and post op shoulder ROM HEP    Person(s) Educated  Patient;Child(ren)    Methods  Explanation;Demonstration;Handout    Comprehension  Returned demonstration;Verbalized understanding           Breast Clinic Goals - 06/18/17 1435      Patient will be able to verbalize understanding of pertinent lymphedema risk reduction practices relevant to her diagnosis specifically related to skin care.   Time  1    Period  Days    Status  Achieved      Patient will be able to return demonstrate and/or verbalize understanding of the post-op home exercise program related to regaining shoulder range of motion.   Time  1    Period  Days    Status  Achieved      Patient will be able to verbalize understanding of the importance of attending the postoperative After Breast Cancer Class for further lymphedema risk reduction education and therapeutic exercise.   Time  1    Period  Days    Status  Achieved       Long Term Clinic Goals - 06/18/17 1436      CC Long Term Goal  #1   Title  Patient will demonstrate ROM and function returned to baseline for left shoulder.    Time  8    Period  Weeks    Status  New          Plan - 06/18/17 1431    Clinical Impression Statement  Patient was diagnosed on 05/20/17 with left triple negative breast cancer. It measures 1.4 cm and is located in the lower outer quadrant with a Ki67 of 90%. She is legally blind and has hypertension. Her multidisciplinary medical team met prior to her assessments to determine a recommended treatment plan. She is planning to have a left lumpectomy and sentinel node, chemotherapy and radiation. She will benefit from a PT visit to reassess after surgery and determine needs.    History and Personal Factors relevant to plan of care:  Blind    Clinical Presentation  Stable    Clinical Decision Making   Low    Rehab Potential  Excellent    Clinical Impairments Affecting Rehab Potential  Blind and may need additonal help with HEP    PT Frequency  -- Eval and 1 f/u visit   Eval and 1 f/u visit   PT Treatment/Interventions  ADLs/Self Care Home Management;Therapeutic exercise;Patient/family education    PT Next Visit Plan  Will reassess after surgery to determine PT needs    PT Home Exercise Plan  post op shoulder ROM HEP    Consulted and Agree with Plan of Care  Patient;Family member/caregiver    Family Member Consulted  Daughter       Patient will benefit from skilled therapeutic intervention in order to improve the following deficits and impairments:  Pain, Postural dysfunction, Impaired UE functional use, Decreased range of motion, Decreased knowledge of precautions  Visit Diagnosis: Malignant neoplasm of lower-outer quadrant of left breast of female, estrogen receptor negative ([place]) - Plan: PT plan of care cert/re-cert  Abnormal posture - Plan: PT plan of care cert/re-cert  G-Codes - 49/44/96 1436    Functional Assessment Tool Used (Outpatient Only)  Clinical Judgement    Functional Limitation  Other PT primary    Other PT Primary Current Status [phone])  At least 1 percent but less than 20 percent impaired, limited or restricted    Other PT Primary Goal Status ([id number])  At least 1 percent but less than 20 percent impaired, limited or restricted      Patient will follow up at outpatient cancer rehab 3-4 weeks following surgery.  If the patient requires physical therapy at that time, a specific plan will be dictated and sent to the referring physician for approval. The patient was educated today on appropriate basic range of motion exercises to begin post operatively and the importance of attending the After Breast Cancer class following surgery.  Patient was educated today on lymphedema risk reduction practices as it pertains to recommendations that will benefit the patient immediately  following surgery.  She verbalized good understanding.     Problem List Patient Active Problem List   Diagnosis Date Noted  . Malignant neoplasm of lower-outer quadrant of left breast of female, estrogen receptor negative ([place]) 06/11/2017  . Cholelithiasis 05/19/2017  . Coronary artery calcification seen on CAT scan 05/19/2017  . Low grade squamous intraepith lesion on cytologic smear cervix ([place]) 04/09/2017  . HPV (human papilloma virus) infection 04/09/2017  . COPD (chronic obstructive pulmonary disease) ([place])   . Essential hypertension 03/20/2017  . Screen for colon cancer 03/20/2017  . Retinitis pigmentosa   . Blind   . Solitary pulmonary nodule on lung CT 03/16/2016  . Abnormal myocardial perfusion study 03/15/2016  . Abnormal x-ray of lungs with single pulmonary nodule 03/15/2016  . Chronic combined systolic and diastolic heart failure ([place]) 12/28/2015  . Dilated cardiomyopathy ([place]) 12/27/2015   Annia Friendly, PT 06/18/17 2:39 PM  [place], [place], [address] Phone: [phone]   Fax:  [phone]  Name: Kristin Ward MRN: [id number] Date of Birth: 09/06/1955

## 2017-06-18 NOTE — Progress Notes (Signed)
Nutrition Assessment  Reason for Assessment:  Pt seen in Breast Clinic  ASSESSMENT:   61 year old female with new diagnosis of breast cancer.  Past medical history CHF, HTN, cardiomyopathy, COPD.  Patient is also blind.    Patient reports good appetite recently eliminated animal products except for fish.   Medications:  reviewed  Labs: reviewed  Anthropometrics:   Height: 67 inches Weight: 239 lb 9.6 oz BMI: 37   NUTRITION DIAGNOSIS: Food and nutrition related knowledge deficit related to new diagnosis of breast cancer as evidenced by no prior need for nutrition related information.  INTERVENTION:   Discussed and provided packet of information regarding nutritional tips for breast cancer patients.  Questions answered.  Teachback method used.  Contact information provided and patient knows to contact me with questions/concerns.    MONITORING, EVALUATION, and GOAL: Pt will consume a healthy plant based diet to maintain lean body mass throughout treatment.   Kristin Ward, Ward, [place] Registered Dietitian [phone] (pager)

## 2017-06-18 NOTE — Progress Notes (Signed)
Radiation Oncology         ([phone]) [phone] ________________________________  Name: Kristin Ward        MRN: [id number]  Date of Service: 06/18/2017 DOB: Mar 09, 1956  GG:YIRSWN, Laurian Brim, NP-C  [name], MD     REFERRING PHYSICIAN: [name], MD   DIAGNOSIS: The encounter diagnosis was Malignant neoplasm of lower-outer quadrant of left breast of female, estrogen receptor negative ([place]).   HISTORY OF PRESENT ILLNESS: Kristin Ward is a 61 y.o. female seen in the multidisciplinary breast clinic for a new diagnosis of left breast cancer. The patient was noted to have a screening mammogram that revealed a mass in the left breast at 5:30. The mass on ultrasound was 1.4 x 1.1 x 1 cm  And she ha done axillary node that was enlarged. Adjacent to this she had a 5 mm cyst as well which was aspirated along with her left biopsy on 06/06/17. Final pathology of the cystic fluid was benign and her mass was consistent with a grade 3, triple negative, invasive ductal carcinoma. Her axilla was also sampled and was negative for malignancy. She comes today to discuss options of treatment for her cancer.   PREVIOUS RADIATION THERAPY: No   PAST MEDICAL HISTORY:  Past Medical History:  Diagnosis Date  . Abnormal myocardial perfusion study 03/15/2016   EF 15%, severe LV systolic dysfcn, global hypokinesis and inferior akinesis. dilated LV  . Abnormal x-ray of lungs with single pulmonary nodule 03/15/2016   per records from [place]   . Blind   . Cholelithiasis 05/19/2017   On CT  . COPD (chronic obstructive pulmonary disease) ([place])   . Coronary artery calcification seen on CAT scan 05/19/2017  . Dilated idiopathic cardiomyopathy ([place]) 12/2015   EF 15-20%. Diagnosed in [facility]  . Headache   . Hypertension   . Malignant neoplasm of lower-outer quadrant of left breast of female, estrogen receptor negative ([place]) 06/11/2017  . Retinitis pigmentosa   . Solitary  pulmonary nodule on lung CT 03/16/2016   per records from [place]  . Uveitis        PAST SURGICAL HISTORY: Past Surgical History:  Procedure Laterality Date  . brain cyst removed     to help with headaches per patient   . NASAL TURBINATE REDUCTION    . TRANSTHORACIC ECHOCARDIOGRAM  12/2015   [facility] May 2017: Mild concentric LVH. Global hypokinesis. GR daily. EF 18% severe LA dilation. Mitral annular dilatation with papillary muscle dysfunction and moderate MR. Dilated IVC consistent with elevated RAP.     FAMILY HISTORY:  Family History  Problem Relation Age of Onset  . Colon cancer Mother 49  . Heart attack Father   . Cancer Cousin        pancreatic cancer  . Cancer Cousin        breast cancer      SOCIAL HISTORY:  reports that she quit smoking about 20 months ago. Her smoking use included cigarettes. She has a 5.75 pack-year smoking history. she has never used smokeless tobacco. She reports that she does not drink alcohol or use drugs. The patient is widowed and lives in [place]. She is accompanied by her daughter. 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 She relocated to [place] about 2 years ago and is accompanied by her daughter.  ALLERGIES: Latex; Tylenol with codeine #3 [acetaminophen-codeine]; and Penicillins   MEDICATIONS:  Current Outpatient Medications  Medication Sig Dispense Refill  . amLODipine (NORVASC) 5 MG tablet Take 1 tablet (5 mg total) by mouth daily. 30 tablet 5  . aspirin 81 MG chewable tablet Chew 81 mg by mouth daily.    . carvedilol (COREG) 12.5 MG tablet Take 1 tablet (12.5 mg total) by mouth 2 (two) times daily with a meal. 60 tablet 5  . furosemide (LASIX) 40 MG tablet Take 1 tablet (40 mg total) by mouth daily. (Patient taking differently: Take 40-80 mg by mouth daily. TAKE 2 TABLETS IF WEIGHT IS 240 LBS) 30 tablet 5  . sacubitril-valsartan (ENTRESTO) 24-26 MG Take 1  tablet by mouth 2 (two) times daily. 60 tablet 5  . spironolactone (ALDACTONE) 25 MG tablet Take 1 tablet (25 mg total) by mouth daily. 30 tablet 5  . valACYclovir (VALTREX) 1000 MG tablet Take 1 tablet (1,000 mg total) by mouth daily. (Patient not taking: Reported on 06/18/2017) 7 tablet 6   No current facility-administered medications for this encounter.      REVIEW OF SYSTEMS: On review of systems, the patient reports that she is doing well overall. She denies any chest pain, shortness of breath, cough, fevers, chills, night sweats, unintended weight changes. She denies any bowel or bladder disturbances, and denies abdominal pain, nausea or vomiting. She denies any new musculoskeletal or joint aches or pains. A complete review of systems is obtained and is otherwise negative.     PHYSICAL EXAM:  Wt Readings from Last 3 Encounters:  06/18/17 239 lb 9.6 oz (108.7 kg)  05/23/17 240 lb (108.9 kg)  04/30/17 237 lb 6.4 oz (107.7 kg)   Temp Readings from Last 3 Encounters:  06/18/17 97.9 F (36.6 C) (Oral)  10/09/15 97.8 F (36.6 C) (Oral)  07/03/15 98 F (36.7 C) (Oral)   BP Readings from Last 3 Encounters:  06/18/17 127/82  05/09/17 (!) 147/87  04/30/17 118/81   Pulse Readings from Last 3 Encounters:  06/18/17 68  05/09/17 73  04/30/17 65     In general this is a well appearing African American female in no acute distress. She is alert and oriented x4 and appropriate throughout the examination. HEENT reveals that the patient is normocephalic, atraumatic. EOMs are intact. PERRLA. Skin is intact without any evidence of gross lesions. Cardiovascular exam reveals a regular rate and rhythm, no clicks rubs or murmurs are auscultated. Chest is clear to auscultation bilaterally. Lymphatic assessment is performed and does not reveal any adenopathy in the cervical, supraclavicular, axillary, or inguinal chains. Bilateral breast exam is performed and reveals biopsy site induration and  induration deep to this site. No mass is noted of the right breast, and no nipple bleeding or discharge is noted.   Abdomen has active bowel sounds in all quadrants and is intact. The abdomen is soft, non tender, non distended. Lower extremities are negative for pretibial pitting edema, deep calf tenderness, cyanosis or clubbing.   ECOG = 0  0 - Asymptomatic (Fully active, able to carry on all predisease activities without restriction)  1 - Symptomatic but completely ambulatory (Restricted in physically strenuous activity but ambulatory and able to carry out work of a light or sedentary nature. For example, light housework, office work)  2 - Symptomatic, <50% in bed during the day (Ambulatory and capable of all self care but unable to carry out any work activities. Up and about more than 50% of waking hours)  3 - Symptomatic, >50% in bed, but not bedbound (Capable of only limited self-care,  confined to bed or chair 50% or more of waking hours)  4 - Bedbound (Completely disabled. Cannot carry on any self-care. Totally confined to bed or chair)  5 - Death   Eustace Pen MM, Creech RH, Tormey DC, et al. [phone]). "Toxicity and response criteria of the [facility] Group". [place] Oncol. 5 (6): 649-55    LABORATORY DATA:  Lab Results  Component Value Date   WBC 6.6 06/18/2017   HGB 12.9 06/18/2017   HCT 38.4 06/18/2017   MCV 90.6 06/18/2017   PLT 292 06/18/2017   Lab Results  Component Value Date   NA 142 06/18/2017   K 4.3 06/18/2017   CL 108 03/20/2017   CO2 23 06/18/2017   Lab Results  Component Value Date   ALT 30 06/18/2017   AST 22 06/18/2017   ALKPHOS 104 06/18/2017   BILITOT 0.48 06/18/2017      RADIOGRAPHY: Dg Bone Density  Result Date: 05/20/2017 EXAM: DUAL X-RAY ABSORPTIOMETRY (DXA) FOR BONE MINERAL DENSITY IMPRESSION: Referring Physician:  Girtha Rm PATIENT: Name: Vaudine, Dutan Patient ID: [id number] Birth Date: Feb 09, 1956 Height: 66.0 in.  Sex: Female Measured: 05/20/2017 Weight: 226.0 lbs. Indications: Estrogen Deficient, Postmenopausal Fractures: None Treatments: None ASSESSMENT: The BMD measured at Femur Total Left is 1.049 g/cm2 with a T-score of 0.3. This patient is considered normal according to [facility]) criteria. Site Region Measured Date Measured Age YA BMD Significant CHANGE T-score DualFemur Total Left [age]    58.9         0.3     1.049 g/cm2 AP Spine  L1-L4      7/20/2017    58.9         3.3     1.601 g/cm2 World Health Organization [facility]) criteria for post-menopausal, Caucasian Women: Normal       T-score at or above -1 SD Osteopenia   T-score between -1 and -2.5 SD Osteoporosis T-score at or below -2.5 SD RECOMMENDATION: [place] recommends that FDA-approved medical therapies be considered in postmenopausal women and men age 74 or older with a: 1. Hip or vertebral (clinical or morphometric) fracture. 2. T-score of <-2.5 at the spine or hip. 3. Ten-year fracture probability by FRAX of 3% or greater for hip fracture or 20% or greater for major osteoporotic fracture. All treatment decisions require clinical judgment and consideration of individual patient factors, including patient preferences, co-morbidities, previous drug use, risk factors not captured in the FRAX model (e.g. falls, vitamin D deficiency, increased bone turnover, interval significant decline in bone density) and possible under - or over-estimation of fracture risk by FRAX. All patients should ensure an adequate intake of dietary calcium (1200 mg/d) and vitamin D (800 IU daily) unless contraindicated. FOLLOW-UP: People with diagnosed cases of osteoporosis or at high risk for fracture should have regular bone mineral density tests. For patients eligible for Medicare, routine testing is allowed once every 2 years. The testing frequency can be increased to one year for patients who have rapidly progressing disease, those who  are receiving or discontinuing medical therapy to restore bone mass, or have additional risk factors. I have reviewed this report, and agree with the above findings. [facility] Radiology Electronically Signed   By: Rolm Baptise M.D.   On: 05/20/2017 08:35   US Breast Ltd Uni Left Inc Axilla  Result Date: 05/30/2017 CLINICAL DATA:  61 year old female for further evaluation of possible left breast mass on screening mammogram. EXAM: 2D DIGITAL DIAGNOSTIC  LEFT MAMMOGRAM WITH CAD AND ADJUNCT TOMO ULTRASOUND LEFT BREAST COMPARISON:  Previous exam(s). ACR Breast Density Category b: There are scattered areas of fibroglandular density. FINDINGS: 2D and 3D spot compression views of the left breast demonstrate a 1.4 cm irregular mass within the posterior slightly upper outer left breast. Mammographic images were processed with CAD. Targeted ultrasound is performed, showing the following: A 1 x 1.1 x 1.4 cm irregular hypoechoic mass at the 5:30 position of the left breast 3 cm from the nipple. A 0.5 x 0.4 x 0.5 cm slightly irregular oval hypoechoic mass at the 5:30 position of the left breast 2 cm from the nipple. The 2 above masses are separated by a distance of 1.2 cm. At least 2 left axillary lymph nodes with upper limits of normal cortical thickness noted. IMPRESSION: 1. Highly suspicious 1.4 cm mass in the slightly lower slightly outer left breast -tissue sampling recommended. 2. Indeterminate 0.5 mm mass in the slightly lower slightly outer left breast -tissue sampling recommended. 3. At least 2 left axillary lymph nodes with borderline cortical thickness. Tissue sampling of 1 of these nodes recommended. RECOMMENDATION: Ultrasound-guided biopsy of both left breast masses and 1 of the borderline left axillary lymph nodes. I have discussed the findings and recommendations with the patient. Results were also provided in writing at the conclusion of the visit. If applicable, a reminder letter will be sent to the patient  regarding the next appointment. BI-RADS CATEGORY  5: Highly suggestive of malignancy. Electronically Signed   By: Margarette Canada M.D.   On: 05/30/2017 11:49   Mm Diag Breast Tomo Uni Left  Result Date: 05/30/2017 CLINICAL DATA:  61 year old female for further evaluation of possible left breast mass on screening mammogram. EXAM: 2D DIGITAL DIAGNOSTIC LEFT MAMMOGRAM WITH CAD AND ADJUNCT TOMO ULTRASOUND LEFT BREAST COMPARISON:  Previous exam(s). ACR Breast Density Category b: There are scattered areas of fibroglandular density. FINDINGS: 2D and 3D spot compression views of the left breast demonstrate a 1.4 cm irregular mass within the posterior slightly upper outer left breast. Mammographic images were processed with CAD. Targeted ultrasound is performed, showing the following: A 1 x 1.1 x 1.4 cm irregular hypoechoic mass at the 5:30 position of the left breast 3 cm from the nipple. A 0.5 x 0.4 x 0.5 cm slightly irregular oval hypoechoic mass at the 5:30 position of the left breast 2 cm from the nipple. The 2 above masses are separated by a distance of 1.2 cm. At least 2 left axillary lymph nodes with upper limits of normal cortical thickness noted. IMPRESSION: 1. Highly suspicious 1.4 cm mass in the slightly lower slightly outer left breast -tissue sampling recommended. 2. Indeterminate 0.5 mm mass in the slightly lower slightly outer left breast -tissue sampling recommended. 3. At least 2 left axillary lymph nodes with borderline cortical thickness. Tissue sampling of 1 of these nodes recommended. RECOMMENDATION: Ultrasound-guided biopsy of both left breast masses and 1 of the borderline left axillary lymph nodes. I have discussed the findings and recommendations with the patient. Results were also provided in writing at the conclusion of the visit. If applicable, a reminder letter will be sent to the patient regarding the next appointment. BI-RADS CATEGORY  5: Highly suggestive of malignancy. Electronically Signed    By: Margarette Canada M.D.   On: 05/30/2017 11:49   Mm Screening Breast Tomo Bilateral  Result Date: 05/20/2017 CLINICAL DATA:  Screening. EXAM: 2D DIGITAL SCREENING BILATERAL MAMMOGRAM WITH CAD AND ADJUNCT TOMO COMPARISON:  Previous exam(s). ACR Breast Density Category c: The breast tissue is heterogeneously dense, which may obscure small masses. FINDINGS: In the left breast, possible asymmetries warrant further evaluation. In the right breast, no findings suspicious for malignancy. Images were processed with CAD. IMPRESSION: Further evaluation is suggested for possible asymmetries in the left breast. RECOMMENDATION: Diagnostic mammogram and possibly ultrasound of the left breast. (Code:[id number]-L-27M) The patient will be contacted regarding the findings, and additional imaging will be scheduled. BI-RADS CATEGORY  0: Incomplete. Need additional imaging evaluation and/or prior mammograms for comparison. Electronically Signed   By: Fidela Salisbury M.D.   On: 05/20/2017 16:40   [place] Axillary Node Core Biopsy Left  Addendum Date: 06/16/2017   ADDENDUM REPORT: 06/10/2017 13:50 ADDENDUM: Pathology revealed GRADE III INVASIVE DUCTAL CARCINOMA of the Left breast, 5:30 o'clock. NO CARCINOMA IDENTIFIED in the Left axillary lymph node. This was found to be concordant by Dr. Ammie Ferrier. Pathology results were discussed with the patient by telephone. The patient reported doing well after the biopsies with tenderness at the sites. Post biopsy instructions and care were reviewed and questions were answered. The patient was encouraged to call [facility] for any additional concerns. The patient was referred to [facility] on June 18, 2017. Pathology results reported by Terie Purser, RN on 06/10/2017. Electronically Signed   By: Ammie Ferrier M.D.   On: 06/10/2017 13:50   Result Date: 06/16/2017 CLINICAL DATA:   61 year old female presenting for ultrasound-guided biopsy of 2 left breast masses and left axillary lymph node. EXAM: ULTRASOUND GUIDED LEFT BREAST CORE NEEDLE BIOPSY COMPARISON:  Previous exam(s). FINDINGS: I met with the patient and we discussed the procedure of ultrasound-guided biopsy, including benefits and alternatives. We discussed the high likelihood of a successful procedure. We discussed the risks of the procedure, including infection, bleeding, tissue injury, clip migration, and inadequate sampling. Informed written consent was given. The usual time-out protocol was performed immediately prior to the procedure. Lesion quadrant: Lower outer quadrant Using sterile technique and 1% Lidocaine as local anesthetic, under direct ultrasound visualization, a 14 gauge spring-loaded device was used to perform biopsy of a mass the left breast at 5:30 using an inferior approach. At the conclusion of the procedure a ribbon shaped tissue marker clip was deployed into the biopsy cavity. ----- Next, I attempted aspiration of the small mass at 5:30, 2 cm from the nipple using sterile technique, a 1% lidocaine local anesthetic and a 21 gauge needle. The needle aspirated to complete resolution, therefore biopsy was not necessary. ----- Lesion quadrant: Left axilla Using sterile technique and 1% Lidocaine as local anesthetic, under direct ultrasound visualization, a 14 gauge spring-loaded device was used to perform biopsy of a lymph node in the left axilla using an inferior approach. At the conclusion of the procedure a HydroMARK tissue marker clip was deployed into the biopsy cavity. Follow up 2 view mammogram was performed and dictated separately. IMPRESSION: 1. Ultrasound guided biopsy of a left breast mass at 5:30. No apparent complications. 2. Ultrasound-guided aspiration of a left breast cyst at 5:30. No apparent complications. 3. Ultrasound-guided biopsy of a left axillary lymph node. No apparent complications.  Electronically Signed: By: Ammie Ferrier M.D. On: 06/06/2017 14:36   Mm Clip Placement Left  Result Date: 06/06/2017 CLINICAL DATA:  Post biopsy mammogram of the left breast for clip placement. EXAM: DIAGNOSTIC LEFT MAMMOGRAM POST ULTRASOUND BIOPSY COMPARISON:  Previous exam(s). FINDINGS: Mammographic  images were obtained following ultrasound guided biopsy of a mass in the left breast and a left axillary lymph node. The ribbon shaped biopsy marking clip within the mass mass in the lower slightly outer left breast is well positioned. The left axillary biopsy marking clip cannot be included in the field of view due to the deep position of the clip lymph node within the axilla. IMPRESSION: 1. Appropriate positioning of the ribbon shaped biopsy marking clip within the left axillary lymph node. 2.  The left axillary clip is not included within the field of view. Final Assessment: Post Procedure Mammograms for Marker Placement Electronically Signed   By: Ammie Ferrier M.D.   On: 06/06/2017 14:43   [place] Lt Breast Bx W Loc Dev 1st Lesion Img Bx Spec US Guide  Addendum Date: 06/16/2017   ADDENDUM REPORT: 06/10/2017 13:50 ADDENDUM: Pathology revealed GRADE III INVASIVE DUCTAL CARCINOMA of the Left breast, 5:30 o'clock. NO CARCINOMA IDENTIFIED in the Left axillary lymph node. This was found to be concordant by Dr. Ammie Ferrier. Pathology results were discussed with the patient by telephone. The patient reported doing well after the biopsies with tenderness at the sites. Post biopsy instructions and care were reviewed and questions were answered. The patient was encouraged to call [facility] for any additional concerns. The patient was referred to [facility] at [facility] on June 18, 2017. Pathology results reported by Terie Purser, RN on 06/10/2017. Electronically Signed   By: Ammie Ferrier M.D.   On:  06/10/2017 13:50   Result Date: 06/16/2017 CLINICAL DATA:  61 year old female presenting for ultrasound-guided biopsy of 2 left breast masses and left axillary lymph node. EXAM: ULTRASOUND GUIDED LEFT BREAST CORE NEEDLE BIOPSY COMPARISON:  Previous exam(s). FINDINGS: I met with the patient and we discussed the procedure of ultrasound-guided biopsy, including benefits and alternatives. We discussed the high likelihood of a successful procedure. We discussed the risks of the procedure, including infection, bleeding, tissue injury, clip migration, and inadequate sampling. Informed written consent was given. The usual time-out protocol was performed immediately prior to the procedure. Lesion quadrant: Lower outer quadrant Using sterile technique and 1% Lidocaine as local anesthetic, under direct ultrasound visualization, a 14 gauge spring-loaded device was used to perform biopsy of a mass the left breast at 5:30 using an inferior approach. At the conclusion of the procedure a ribbon shaped tissue marker clip was deployed into the biopsy cavity. ----- Next, I attempted aspiration of the small mass at 5:30, 2 cm from the nipple using sterile technique, a 1% lidocaine local anesthetic and a 21 gauge needle. The needle aspirated to complete resolution, therefore biopsy was not necessary. ----- Lesion quadrant: Left axilla Using sterile technique and 1% Lidocaine as local anesthetic, under direct ultrasound visualization, a 14 gauge spring-loaded device was used to perform biopsy of a lymph node in the left axilla using an inferior approach. At the conclusion of the procedure a HydroMARK tissue marker clip was deployed into the biopsy cavity. Follow up 2 view mammogram was performed and dictated separately. IMPRESSION: 1. Ultrasound guided biopsy of a left breast mass at 5:30. No apparent complications. 2. Ultrasound-guided aspiration of a left breast cyst at 5:30. No apparent complications. 3. Ultrasound-guided biopsy of  a left axillary lymph node. No apparent complications. Electronically Signed: By: Ammie Ferrier M.D. On: 06/06/2017 14:36   US Breast Aspiration Left  Addendum Date: 06/16/2017   ADDENDUM REPORT: 06/10/2017  13:50 ADDENDUM: Pathology revealed GRADE III INVASIVE DUCTAL CARCINOMA of the Left breast, 5:30 o'clock. NO CARCINOMA IDENTIFIED in the Left axillary lymph node. This was found to be concordant by Dr. Ammie Ferrier. Pathology results were discussed with the patient by telephone. The patient reported doing well after the biopsies with tenderness at the sites. Post biopsy instructions and care were reviewed and questions were answered. The patient was encouraged to call [facility] [place] for any additional concerns. The patient was referred to [facility] on June 18, 2017. Pathology results reported by Terie Purser, RN on 06/10/2017. Electronically Signed   By: Ammie Ferrier M.D.   On: 06/10/2017 13:50   Result Date: 06/16/2017 CLINICAL DATA:  61 year old female presenting for ultrasound-guided biopsy of 2 left breast masses and left axillary lymph node. EXAM: ULTRASOUND GUIDED LEFT BREAST CORE NEEDLE BIOPSY COMPARISON:  Previous exam(s). FINDINGS: I met with the patient and we discussed the procedure of ultrasound-guided biopsy, including benefits and alternatives. We discussed the high likelihood of a successful procedure. We discussed the risks of the procedure, including infection, bleeding, tissue injury, clip migration, and inadequate sampling. Informed written consent was given. The usual time-out protocol was performed immediately prior to the procedure. Lesion quadrant: Lower outer quadrant Using sterile technique and 1% Lidocaine as local anesthetic, under direct ultrasound visualization, a 14 gauge spring-loaded device was used to perform biopsy of a mass the left breast at 5:30 using an  inferior approach. At the conclusion of the procedure a ribbon shaped tissue marker clip was deployed into the biopsy cavity. ----- Next, I attempted aspiration of the small mass at 5:30, 2 cm from the nipple using sterile technique, a 1% lidocaine local anesthetic and a 21 gauge needle. The needle aspirated to complete resolution, therefore biopsy was not necessary. ----- Lesion quadrant: Left axilla Using sterile technique and 1% Lidocaine as local anesthetic, under direct ultrasound visualization, a 14 gauge spring-loaded device was used to perform biopsy of a lymph node in the left axilla using an inferior approach. At the conclusion of the procedure a HydroMARK tissue marker clip was deployed into the biopsy cavity. Follow up 2 view mammogram was performed and dictated separately. IMPRESSION: 1. Ultrasound guided biopsy of a left breast mass at 5:30. No apparent complications. 2. Ultrasound-guided aspiration of a left breast cyst at 5:30. No apparent complications. 3. Ultrasound-guided biopsy of a left axillary lymph node. No apparent complications. Electronically Signed: By: Ammie Ferrier M.D. On: 06/06/2017 14:36       IMPRESSION/PLAN: 1. 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 Dr. Lisbeth Renshaw discusses the pathology findings and reviews the nature of triple negative breast disease. The consensus from the breast conference include breast conservation with lumpectomy with sentinel lymph node biopsy as well as placement of a port a cath. Her course would then be followed by adjuvant chemotherapy. Upon completion of chemotherapy, the patient's course would then be followed by external radiotherapy to the breast. We discussed the role and utility of radiation. The patient is interested in proceeding. Dr. Lisbeth Renshaw discusses the delivery and logistics of radiotherapy and anticipates a course of 4- 6 1/2 weeks with deep inspiration breath hold technique. We will see  her back about 3-4 weeks after completing chemotherapy, and then meet to review the role of radiation and get her set up for simulation.   The above documentation reflects my  direct findings during this shared patient visit. Please see the separate note by Dr. Lisbeth Renshaw on this date for the remainder of the patient's plan of care.    Carola Rhine, PAC

## 2017-06-19 ENCOUNTER — Encounter: Payer: Self-pay | Admitting: Genetic Counselor

## 2017-06-19 ENCOUNTER — Ambulatory Visit (HOSPITAL_BASED_OUTPATIENT_CLINIC_OR_DEPARTMENT_OTHER): Payer: Medicare HMO | Admitting: Genetic Counselor

## 2017-06-19 DIAGNOSIS — Z803 Family history of malignant neoplasm of breast: Secondary | ICD-10-CM

## 2017-06-19 DIAGNOSIS — Z7183 Encounter for nonprocreative genetic counseling: Secondary | ICD-10-CM

## 2017-06-19 DIAGNOSIS — Z171 Estrogen receptor negative status [ER-]: Secondary | ICD-10-CM

## 2017-06-19 DIAGNOSIS — Z1379 Encounter for other screening for genetic and chromosomal anomalies: Secondary | ICD-10-CM | POA: Insufficient documentation

## 2017-06-19 DIAGNOSIS — C50512 Malignant neoplasm of lower-outer quadrant of left female breast: Secondary | ICD-10-CM | POA: Diagnosis not present

## 2017-06-19 DIAGNOSIS — Z800 Family history of malignant neoplasm of digestive organs: Secondary | ICD-10-CM

## 2017-06-19 NOTE — Progress Notes (Signed)
[facility]      Initial Visit   Patient Name: Kristin Ward Patient DOB: Oct 04, 1955 Patient Age: 61 y.o. Encounter Date: 06/19/2017  Referring Provider: [name], MD  Primary Care Provider: Girtha Rm, NP-C  Reason for Visit: Evaluate for hereditary susceptibility to cancer    Assessment and Plan:  . Kristin Ward's history is not suggestive of a hereditary predisposition to cancer. However, she has triple negative breast cancer and meets NCCN criteria for genetic testing.  . Testing is recommended to determine whether she has a pathogenic mutation that will impact her screening and risk-reduction for cancer. A negative result will be reassuring.  . Kristin Ward wished to pursue genetic testing and a blood sample will be sent for analysis of the 83 genes on [facility]'s Multi-Cancer panel (ALK, APC, ATM, AXIN2, BAP1, BARD1, BLM, BMPR1A, BRCA1, BRCA2, BRIP1, CASR, CDC73, CDH1, CDK4, CDKN1B, CDKN1C, CDKN2A, CEBPA, CHEK2, CTNNA1, DICER1, DIS3L2, EGFR, EPCAM, FH, FLCN, GATA2, GPC3, GREM1, HOXB13, HRAS, KIT, MAX, MEN1, MET, MITF, MLH1, MSH2, MSH3, MSH6, MUTYH, NBN, NF1, NF2, NTHL1, PALB2, PDGFRA, PHOX2B, PMS2, POLD1, POLE, POT1, PRKAR1A, PTCH1, PTEN, RAD50, RAD51C, RAD51D, RB1, RECQL4, RET, RUNX1, SDHA, SDHAF2, SDHB, SDHC, SDHD, SMAD4, SMARCA4, SMARCB1, SMARCE1, STK11, SUFU, TERC, TERT, TMEM127, TP53, TSC1, TSC2, VHL, WRN, WT1).   . Results should be available in approximately 2-4 weeks, at which point we will contact her and address implications for her as well as address genetic testing for at-risk family members, if needed.     Dr. Burr Medico was available for questions concerning this case. Total time spent by me in face-to-face counseling was approximately 30 minutes.   _____________________________________________________________________   History of Present Illness: Kristin Ward, a 61 y.o. female, is being seen at the [facility] due to a personal and family history of cancer. She presents to clinic today to discuss the possibility of a hereditary predisposition to cancer and discuss whether genetic testing is warranted.  Kristin Ward was recently diagnosed with left breastd cancer at the age of 61. She indicated that she will be pursuing a lumpectomy.  The breast tumor was ER negative, PR negative, and HER2 negative.  Oncology History   Cancer Staging Malignant neoplasm of lower-outer quadrant of left breast of female, estrogen receptor negative ([place]) Staging form: Breast, AJCC 8th Edition - Clinical stage from 06/06/2017: Stage IB (cT1c, cN0, cM0, G3, ER: Negative, PR: Negative, HER2: Negative) - Signed by [name], MD on 06/15/2017       Malignant neoplasm of lower-outer quadrant of left breast of female, estrogen receptor negative ([place])   05/30/2017 Mammogram    Diagnostic mammo and US IMPRESSION: 1. Highly suspicious 1.4 cm mass in the slightly lower slightly outer left breast -tissue sampling recommended. 2. Indeterminate 0.5 mm mass in the slightly lower slightly outer left breast -tissue sampling recommended. 3. At least 2 left axillary lymph nodes with borderline cortical thickness.      06/06/2017 Initial Biopsy    Diagnosis 1. Breast, left, needle core biopsy, 5:30 o'clock - INVASIVE DUCTAL CARCINOMA, G3 2. Lymph node, needle/core biopsy, left axillary - NO CARCINOMA IDENTIFIED IN ONE LYMPH NODE (0/1)      06/06/2017 Initial Diagnosis    Malignant neoplasm of lower-outer quadrant of left breast of female, estrogen receptor negative ([place])      06/06/2017 Receptors her2    Estrogen Receptor: 0%, NEGATIVE Progesterone Receptor: 0%, NEGATIVE Proliferation Marker Ki67:  70%       Past Medical History:  Diagnosis Date  . Abnormal myocardial perfusion study 03/15/2016   EF 15%, severe LV systolic dysfcn, global hypokinesis and inferior akinesis. dilated LV  . Abnormal x-ray  of lungs with single pulmonary nodule 03/15/2016   per records from [place]   . Blind   . Cholelithiasis 05/19/2017   On CT  . COPD (chronic obstructive pulmonary disease) ([place])   . Coronary artery calcification seen on CAT scan 05/19/2017  . Dilated idiopathic cardiomyopathy ([place]) 12/2015   EF 15-20%. Diagnosed in [facility]  . Family history of colon cancer   . Headache   . Hypertension   . Malignant neoplasm of lower-outer quadrant of left breast of female, estrogen receptor negative ([place]) 06/11/2017  . Retinitis pigmentosa   . Solitary pulmonary nodule on lung CT 03/16/2016   per records from [place]  . Uveitis     Past Surgical History:  Procedure Laterality Date  . brain cyst removed     to help with headaches per patient   . NASAL TURBINATE REDUCTION    . TRANSTHORACIC ECHOCARDIOGRAM  12/2015   [facility] May 2017: Mild concentric LVH. Global hypokinesis. GR daily. EF 18% severe LA dilation. Mitral annular dilatation with papillary muscle dysfunction and moderate MR. Dilated IVC consistent with elevated RAP.    Social History   Socioeconomic History  . Marital status: Widowed    Spouse name: Not on file  . Number of children: Not on file  . Years of education: Not on file  . Highest education level: Not on file  Social Needs  . Financial resource strain: Not on file  . Food insecurity - worry: Not on file  . Food insecurity - inability: Not on file  . Transportation needs - medical: Not on file  . Transportation needs - non-medical: Not on file  Occupational History  . Not on file  Tobacco Use  . Smoking status: Former Smoker    Packs/day: 0.25    Years: 23.00    Pack years: 5.75    Types: Cigarettes    Last attempt to quit: 10/09/2015    Years since quitting: 1.6  . Smokeless tobacco: Never Used  Substance and Sexual Activity  . Alcohol use: No  . Drug use: No  . Sexual activity: No  Other Topics Concern  . Not on file    Social History Narrative   She is a widowed mother of 2 (daughter and son).   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   She moved to [place] apparently back in 2016, but was visiting family in [place] and was diagnosed with Cardiomyopathy.   She is accompanied by her daughter. She walks roughly 1-2 miles a day.     Family History:  During the visit, a 4-generation pedigree was obtained. Family tree will be scanned in the Media tab in Epic  Significant diagnoses include the following:  Family History  Problem Relation Age of Onset  . Colon cancer Mother 59       deceased 76  . Heart attack Father   . Breast cancer Other 37       2nd cousin on maternal side; currently 69  . Pancreatic cancer Other        2nd cousin once removed on maternal side; deceased 75s    Additionally, Kristin Ward has a son (age 12) and a daughter (age 47). She had 2 brothers who died unrelated  to cancer. She has one full sister (age 33) and one paternal half-sister. Her mother had 2 brothers. Her father died at 5 of an MI. He had 10 siblings; no cancers reported.  Kristin Ward ancestry is African American. There is no known Jewish ancestry and no consanguinity.  Discussion: We reviewed the characteristics, features and inheritance patterns of hereditary cancer syndromes. We discussed her risk of harboring a mutation in the context of her personal and family history. We discussed the process of genetic testing, insurance coverage and implications of results: positive, negative and variant of unknown significance (VUS).    Kristin Ward questions were answered to her satisfaction today and she is welcome to call with any additional questions or concerns. Thank you for the referral and allowing [place] to share in the care of your patient.    Steele Berg, MS, [place] Certified Genetic Counselor phone:  [phone] [email or web address]   ______________________________________________________________________ For Office Staff:  Number of people involved in session: 2 Was an Intern/ student involved with case: no

## 2017-06-20 ENCOUNTER — Other Ambulatory Visit: Payer: Self-pay | Admitting: General Surgery

## 2017-06-20 DIAGNOSIS — Z171 Estrogen receptor negative status [ER-]: Principal | ICD-10-CM

## 2017-06-20 DIAGNOSIS — C50512 Malignant neoplasm of lower-outer quadrant of left female breast: Secondary | ICD-10-CM

## 2017-06-23 ENCOUNTER — Telehealth: Payer: Self-pay | Admitting: *Deleted

## 2017-06-23 NOTE — Telephone Encounter (Signed)
Left vm regarding [facility] from 06/18/17. Left contact information for return call.

## 2017-06-23 NOTE — Pre-Procedure Instructions (Signed)
Kristin Ward  06/23/2017      [id number] [place], [place] [address] [place] [address] Phone: [phone] Fax: [phone]    Your procedure is scheduled on November 15  Report to [facility] Admitting at 0800 A.M.  Call this number if you have problems the morning of surgery:  [phone]   Remember:  Do not eat food or drink liquids after midnight.  Continue all other medications as directed by your physician except follow these medication instructions before surgery  Please complete your PRE-SURGERY ENSURE that was given to you at your preadmission appointment by 0600am on the [place]   Take these medicines the morning of surgery with A SIP OF WATER amLODipine (NORVASC) carvedilol (COREG)  7 days prior to surgery STOP taking any Aspirin (unless otherwise instructed by your surgeon), Aleve, Naproxen, Ibuprofen, Motrin, Advil, Goody's, BC's, all herbal medications, fish oil, and all vitamins    Do not wear jewelry, make-up or nail polish.  Do not wear lotions, powders, or perfumes, or deoderant.  Do not shave 48 hours prior to surgery.  Men may shave face and neck.  Do not bring valuables to the hospital.  [facility] is not responsible for any belongings or valuables.  Contacts, dentures or bridgework may not be worn into surgery.  Leave your suitcase in the car.  After surgery it may be brought to your room.  For patients admitted to the hospital, discharge time will be determined by your treatment team.  Patients discharged the day of surgery will not be allowed to drive home.    Special instructions:   [place]- Preparing For Surgery  Before surgery, you can play an important role. Because skin is not sterile, your skin needs to be as free of germs as possible. You can reduce the number of germs on your skin by washing with CHG (chlorahexidine  gluconate) Soap before surgery.  CHG is an antiseptic cleaner which kills germs and bonds with the skin to continue killing germs even after washing.  Please do not use if you have an allergy to CHG or antibacterial soaps. If your skin becomes reddened/irritated stop using the CHG.  Do not shave (including legs and underarms) for at least 48 hours prior to first CHG shower. It is OK to shave your face.  Please follow these instructions carefully.   1. Shower the NIGHT BEFORE SURGERY and the MORNING OF SURGERY with CHG.   2. If you chose to wash your hair, wash your hair first as usual with your normal shampoo.  3. After you shampoo, rinse your hair and body thoroughly to remove the shampoo.  4. Use CHG as you would any other liquid soap. You can apply CHG directly to the skin and wash gently with a scrungie or a clean washcloth.   5. Apply the CHG Soap to your body ONLY FROM THE NECK DOWN.  Do not use on open wounds or open sores. Avoid contact with your eyes, ears, mouth and genitals (private parts). Wash Face and genitals (private parts)  with your normal soap.  6. Wash thoroughly, paying special attention to the area where your surgery will be performed.  7. Thoroughly rinse your body with warm water from the neck down.  8. DO NOT shower/wash with your normal soap after using and rinsing off the CHG Soap.  9. Pat yourself dry  with a CLEAN TOWEL.  10. Wear CLEAN PAJAMAS to bed the night before surgery, wear comfortable clothes the morning of surgery  11. Place CLEAN SHEETS on your bed the night of your first shower and DO NOT SLEEP WITH PETS.    Day of Surgery: Do not apply any deodorants/lotions. Please wear clean clothes to the hospital/surgery center.      Please read over the following fact sheets that you were given.

## 2017-06-24 ENCOUNTER — Encounter (HOSPITAL_COMMUNITY)
Admission: RE | Admit: 2017-06-24 | Discharge: 2017-06-24 | Disposition: A | Discharge status: Home / Self Care | Payer: Medicare HMO | Source: Ambulatory Visit | Attending: General Surgery | Admitting: General Surgery

## 2017-06-24 ENCOUNTER — Other Ambulatory Visit: Payer: Self-pay

## 2017-06-24 ENCOUNTER — Encounter (HOSPITAL_COMMUNITY): Payer: Self-pay

## 2017-06-24 ENCOUNTER — Ambulatory Visit
Admission: RE | Admit: 2017-06-24 | Discharge: 2017-06-24 | Disposition: A | Discharge status: Home / Self Care | Payer: Medicare HMO | Source: Ambulatory Visit | Attending: General Surgery | Admitting: General Surgery

## 2017-06-24 DIAGNOSIS — C50512 Malignant neoplasm of lower-outer quadrant of left female breast: Secondary | ICD-10-CM | POA: Diagnosis present

## 2017-06-24 DIAGNOSIS — Z171 Estrogen receptor negative status [ER-]: Secondary | ICD-10-CM | POA: Diagnosis not present

## 2017-06-24 DIAGNOSIS — Z87891 Personal history of nicotine dependence: Secondary | ICD-10-CM | POA: Diagnosis not present

## 2017-06-24 DIAGNOSIS — J449 Chronic obstructive pulmonary disease, unspecified: Secondary | ICD-10-CM | POA: Diagnosis not present

## 2017-06-24 DIAGNOSIS — I110 Hypertensive heart disease with heart failure: Secondary | ICD-10-CM | POA: Diagnosis not present

## 2017-06-24 DIAGNOSIS — I10 Essential (primary) hypertension: Secondary | ICD-10-CM | POA: Diagnosis not present

## 2017-06-24 DIAGNOSIS — C50912 Malignant neoplasm of unspecified site of left female breast: Secondary | ICD-10-CM | POA: Diagnosis not present

## 2017-06-24 DIAGNOSIS — Z791 Long term (current) use of non-steroidal anti-inflammatories (NSAID): Secondary | ICD-10-CM | POA: Diagnosis not present

## 2017-06-24 DIAGNOSIS — Z7982 Long term (current) use of aspirin: Secondary | ICD-10-CM | POA: Diagnosis not present

## 2017-06-24 LAB — CBC
HCT: 38.8 % (ref 36.0–46.0)
Hemoglobin: 13 g/dL (ref 12.0–15.0)
MCH: 30.4 pg (ref 26.0–34.0)
MCHC: 33.5 g/dL (ref 30.0–36.0)
MCV: 90.9 fL (ref 78.0–100.0)
PLATELETS: 305 10*3/uL (ref 150–400)
RBC: 4.27 MIL/uL (ref 3.87–5.11)
RDW: 13.7 % (ref 11.5–15.5)
WBC: 7.1 10*3/uL (ref 4.0–10.5)

## 2017-06-24 LAB — BASIC METABOLIC PANEL
ANION GAP: 6 (ref 5–15)
BUN: 13 mg/dL (ref 6–20)
CALCIUM: 9.1 mg/dL (ref 8.9–10.3)
CO2: 23 mmol/L (ref 22–32)
Chloride: 108 mmol/L (ref 101–111)
Creatinine, Ser: 0.81 mg/dL (ref 0.44–1.00)
GFR calc non Af Amer: >60 mL/min (ref >60)
Glucose, Bld: 132 mg/dL — ABNORMAL HIGH (ref 65–99)
Potassium: 3.5 mmol/L (ref 3.5–5.1)
Sodium: 137 mmol/L (ref 135–145)

## 2017-06-24 NOTE — Progress Notes (Signed)
Pt. Unaware of need for seed implant visit. Call to [place]- spoke with Sunday Spillers, confirmed that she needs to report to [facility] at 1130 this a.m. Pt. Has had recent cardiac visit with Dr. Ellyn Hack. Echo results in Epic, EKG as well.  Pt. Denies chest concerns today.  Pt.'s daughter lives with her for support of her deficit- visual & is with her at today's viosit.  Enrigue Catena- PCP- [facility]

## 2017-06-24 NOTE — H&P (Signed)
Kristin Ward 06/18/2017 7:30 AM Location: Kristin Ward Patient #: [id number] DOB: May 14, 1956 Undefined / Language: Kristin Ward / Race: Black or African American Female   History of Present Illness Kristin Klein MD; 06/18/2017 11:51 AM) The patient is a 61 year old female who presents with breast cancer. Pt is a 61 yo F who presents for evaluation for a new diagnosis of left breast cancer. She had a screening detected left breast mass. Diagnostic imaging was performed, and this showed a 1.4 cm mass at 5:30 pm. A cyst was also seen and was aspirated to completion. The mass was biopsied and showed a grade 3 triple negative cancer with Ki67 70%. Axilla was negative.  She had menarche at age 88. She is a G4P2 with first child in her early 21s. She has no family history of breast cancer, but her mother had colon cancer.   dx mammo/us 05/30/17 ACR Breast Density Category b: There are scattered areas of fibroglandular density.  FINDINGS: 2D and 3D spot compression views of the left breast demonstrate a 1.4 cm irregular mass within the posterior slightly upper outer left breast.  Mammographic images were processed with CAD.  Targeted ultrasound is performed, showing the following:  A 1 x 1.1 x 1.4 cm irregular hypoechoic mass at the 5:30 position of the left breast 3 cm from the nipple.  A 0.5 x 0.4 x 0.5 cm slightly irregular oval hypoechoic mass at the 5:30 position of the left breast 2 cm from the nipple.  The 2 above masses are separated by a distance of 1.2 cm.  At least 2 left axillary lymph nodes with upper limits of normal cortical thickness noted.  IMPRESSION: 1. Highly suspicious 1.4 cm mass in the slightly lower slightly outer left breast -tissue sampling recommended. 2. Indeterminate 0.5 mm mass in the slightly lower slightly outer left breast -tissue sampling recommended. 3. At least 2 left axillary lymph nodes with borderline cortical thickness. Tissue  sampling of 1 of these nodes recommended.  pathology 06/06/2017 Diagnosis 1. Breast, left, needle core biopsy, 5:30 o'clock - INVASIVE DUCTAL CARCINOMA - SEE COMMENT 2. Lymph node, needle/core biopsy, left axillary - NO CARCINOMA IDENTIFIED IN ONE LYMPH NODE (0/1) - SEE COMMENT Microscopic Comment 1. Based on the biopsy, the carcinoma appears Nottingham grade 3 of 3  labs: CBC, CMET normal   Past Surgical History Kristin Pummel, RN; 06/18/2017 7:31 AM) Breast Biopsy  Bilateral. Oral Ward   Diagnostic Studies History Kristin Pummel, RN; 06/18/2017 7:31 AM) Colonoscopy  >10 years ago Mammogram  within last year Pap Smear  1-5 years ago  Medication History Kristin Pummel, RN; 06/18/2017 7:31 AM) Medications Reconciled  Social History Kristin Pummel, RN; 06/18/2017 7:31 AM) Alcohol use  Occasional alcohol use. Caffeine use  Carbonated beverages, Tea. Illicit drug use  Uses socially only. Tobacco use  Former smoker.  Family History Kristin Pummel, RN; 06/18/2017 7:31 AM) Alcohol Abuse  Father. Colon Cancer  Mother. Heart Disease  Father. Seizure disorder  Sister.  Pregnancy / Birth History Kristin Pummel, RN; 06/18/2017 7:31 AM) Contraceptive History  Oral contraceptives. Gravida  4 Maternal age  26-25 Para  2  Other Problems Kristin Pummel, RN; 06/18/2017 7:31 AM) Asthma  Congestive Heart Failure  Hemorrhoids  High blood pressure     Review of Systems Kristin Spillers [place] RN; 06/18/2017 7:31 AM) General Not Present- Appetite Loss, Chills, Fatigue, Fever, Night Sweats, Weight Gain and Weight Loss. Skin Not Present- Change in Wart/Mole, Dryness, Hives, Jaundice, New Lesions,  Non-Healing Wounds, Rash and Ulcer. HEENT Present- Hearing Loss. Not Present- Earache, Hoarseness, Nose Bleed, Oral Ulcers, Ringing in the Ears, Seasonal Allergies, Sinus Pain, Sore Throat, Visual Disturbances, Wears glasses/contact lenses and Yellow Eyes. Respiratory Not  Present- Bloody sputum, Chronic Cough, Difficulty Breathing, Snoring and Wheezing. Breast Present- Breast Mass and Breast Pain. Not Present- Nipple Discharge and Skin Changes. Cardiovascular Not Present- Chest Pain, Difficulty Breathing Lying Down, Leg Cramps, Palpitations, Rapid Heart Rate, Shortness of Breath and Swelling of Extremities. Gastrointestinal Not Present- Abdominal Pain, Bloating, Bloody Stool, Change in Bowel Habits, Chronic diarrhea, Constipation, Difficulty Swallowing, Excessive gas, Gets full quickly at meals, Hemorrhoids, Indigestion, Nausea, Rectal Pain and Vomiting. Female Genitourinary Present- Nocturia. Not Present- Frequency, Painful Urination, Pelvic Pain and Urgency. Musculoskeletal Not Present- Back Pain, Joint Pain, Joint Stiffness, Muscle Pain, Muscle Weakness and Swelling of Extremities. Neurological Not Present- Decreased Memory, Fainting, Headaches, Numbness, Seizures, Tingling, Tremor, Trouble walking and Weakness. Psychiatric Not Present- Anxiety, Bipolar, Change in Sleep Pattern, Depression, Fearful and Frequent crying. Endocrine Present- Hot flashes. Not Present- Cold Intolerance, Excessive Hunger, Hair Changes, Heat Intolerance and New Diabetes. Hematology Not Present- Blood Thinners, Easy Bruising, Excessive bleeding, Gland problems, HIV and Persistent Infections.  Vitals Kristin Klein MD; 06/18/2017 11:51 AM) 06/18/2017 11:51 AM Weight: 239.6 lb Height: 67in Body Surface Area: 2.18 m Body Mass Index: 37.53 kg/m  Temp.: 97.58F  Pulse: 68 (Regular)  Resp.: 18 (Unlabored)  BP: 127/82 (Sitting, Left Arm, Standard)       Physical Exam Kristin Klein MD; 06/18/2017 11:53 AM) General Mental Status-Alert. General Appearance-Consistent with stated age. Hydration-Well hydrated. Voice-Normal.  Head and Neck Head-normocephalic, atraumatic with no lesions or palpable masses. Trachea-midline. Thyroid Gland Characteristics - normal  size and consistency.  Eye Eyeball - Bilateral-Extraocular movements intact. Sclera/Conjunctiva - Bilateral-No scleral icterus.  Chest and Lung Exam Chest and lung exam reveals -quiet, even and easy respiratory effort with no use of accessory muscles and on auscultation, normal breath sounds, no adventitious sounds and normal vocal resonance. Inspection Chest Wall - Normal. Back - normal.  Breast Note: breasts reasonably symmetric and ptotic bilaterally. the left lower outer breast has a palpable area associated wtih biopsy hematoma around 1 cm. no other palpable masses are present. No LAD. No nipple retraction, nipple discharge, skin dimpling.   Cardiovascular Cardiovascular examination reveals -normal heart sounds, regular rate and rhythm with no murmurs and normal pedal pulses bilaterally.  Abdomen Inspection Inspection of the abdomen reveals - No Hernias. Palpation/Percussion Palpation and Percussion of the abdomen reveal - Soft, Non Tender, No Rebound tenderness, No Rigidity (guarding) and No hepatosplenomegaly. Auscultation Auscultation of the abdomen reveals - Bowel sounds normal.  Neurologic Neurologic evaluation reveals -alert and oriented x 3 with no impairment of recent or remote memory. Mental Status-Normal.  Musculoskeletal Global Assessment -Note: no gross deformities.  Normal Exam - Left-Upper Extremity Strength Normal and Lower Extremity Strength Normal. Normal Exam - Right-Upper Extremity Strength Normal and Lower Extremity Strength Normal.  Lymphatic Head & Neck  General Head & Neck Lymphatics: Bilateral - Description - Normal. Axillary  General Axillary Region: Bilateral - Description - Normal. Tenderness - Non Tender. Femoral & Inguinal  Generalized Femoral & Inguinal Lymphatics: Bilateral - Description - No Generalized lymphadenopathy.    Assessment & Plan Kristin Klein MD; 06/18/2017 11:55 AM) MALIGNANT NEOPLASM OF LOWER-OUTER  QUADRANT OF LEFT BREAST OF FEMALE, ESTROGEN RECEPTOR NEGATIVE (C50.512) Impression: Pt has a new diagnosis of cT1cN0 triple negative left breast cancer. She will be set up for  a seed localized lumpectomy, sentinel lymph node biopsy, and port placement. She will need adjuvant radiation and chemotherapy. She will see genetics due to the triple negative nature of tumor and age.  The surgical procedure was described to the patient. I discussed the incision type and location and that we would need radiology involved on with a wire or seed marker and/or sentinel node.  The risks and benefits of the procedure were described to the patient and she wishes to proceed.  We discussed the risks bleeding, infection, damage to other structures, need for further procedures/surgeries. We discussed the risk of seroma. The patient was advised if the area in the breast in cancer, we may need to go back to Ward for additional tissue to obtain negative margins or for a lymph node biopsy. The patient was advised that these are the most common complications, but that others can occur as well. They were advised against taking aspirin or other anti-inflammatory agents/blood thinners the week before Ward.  I discussed risk of PTX with port placement. She would like to proceed as soon as possible.  60 min spent in evaluation, examination, counseling, and coordination of care. >50% spent in counseling. Current Plans Pt Education - [facility] Breast Cancer Information Given - [facility] "Breast Journey" Package You are being scheduled for Ward- Our schedulers will call you.  You should hear from our office's scheduling department within 5 working days about the location, date, and time of Ward. We try to make accommodations for patient's preferences in scheduling Ward, but sometimes the OR schedule or the surgeon's schedule prevents [place] from making those accommodations.  If you have not heard from our office  [phone]) in 5 working days, call the office and ask for your surgeon's nurse.  If you have other questions about your diagnosis, plan, or Ward, call the office and ask for your surgeon's nurse.  Pt Education - flb breast cancer Ward: discussed with patient and provided information. Referred to Genetic Counseling, for evaluation and follow up [employer]). Routine.   Signed by Kristin Klein, MD (06/18/2017 11:56 AM)

## 2017-06-25 NOTE — Progress Notes (Signed)
Anesthesia Chart Review:  Pt is a 61 year old female scheduled for L breast lumpectomy with radioactive seed and sentinel lymph node biopsy, insertion of port-a-cath on 06/26/2017 with [name], MD.   - Cardiologist is Glenetta Hew, MD. Last office visit 04/30/17 - Oncologist is [name], MD  PMH includes:  Dilated idiopathic cardiomyopathy, HTN, COPD, pulmonary nodule. 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 Former smoker (quit 10/09/15). BMI 37  Medications include: Amlodipine, ASA 81 mg, carvedilol, Lasix, entresto, spironolactone  BP 129/76   Pulse 72   Temp 36.7 C   Resp 18   Ht 5' 7.25" (1.708 m)   Wt 239 lb 4.8 oz (108.5 kg)   SpO2 97%   BMI 37.20 kg/m   Preoperative labs reviewed.    CT chest 05/19/17:  - Biapical and bilateral lower lobe scarring. - No suspicious pulmonary nodules. - Cardiomegaly.  Scattered coronary artery calcifications. - Cholelithiasis.  EKG 04/30/17: Sinus rhythm with first-degree AV block. Minimal voltage criteria for LVH, may be normal variant. T wave abnormality, consider lateral ischemia.  Echo 05/14/17:  - Left ventricle: The cavity size was normal. There was mild concentric hypertrophy. Systolic function was mildly reduced. The estimated ejection fraction was in the range of 45% to 50%. Diffuse hypokinesis. Doppler parameters are consistent with abnormal left ventricular relaxation (grade 1 diastolic dysfunction). - Aorta: Aortic root dimension: 38 mm (ED). - Ascending aorta: The ascending aorta was mildly dilated.  If no changes, I anticipate pt can proceed with surgery as scheduled.   Willeen Cass, FNP-BC [facility]/Anesthesiology Phone: [phone] 06/25/2017 10:04 AM

## 2017-06-26 ENCOUNTER — Ambulatory Visit (HOSPITAL_COMMUNITY): Payer: Medicare HMO | Admitting: Certified Registered Nurse Anesthetist

## 2017-06-26 ENCOUNTER — Encounter (HOSPITAL_COMMUNITY): Payer: Self-pay

## 2017-06-26 ENCOUNTER — Ambulatory Visit (HOSPITAL_COMMUNITY): Payer: Medicare HMO | Admitting: Emergency Medicine

## 2017-06-26 ENCOUNTER — Ambulatory Visit (HOSPITAL_COMMUNITY)
Admission: RE | Admit: 2017-06-26 | Discharge: 2017-06-26 | Disposition: A | Discharge status: Home / Self Care | Payer: Medicare HMO | Source: Ambulatory Visit | Attending: General Surgery | Admitting: General Surgery

## 2017-06-26 ENCOUNTER — Ambulatory Visit (HOSPITAL_COMMUNITY): Payer: Medicare HMO

## 2017-06-26 ENCOUNTER — Encounter (HOSPITAL_COMMUNITY)
Admission: RE | Disposition: A | Discharge status: Home / Self Care | Payer: Self-pay | Source: Ambulatory Visit | Attending: General Surgery

## 2017-06-26 ENCOUNTER — Ambulatory Visit
Admission: RE | Admit: 2017-06-26 | Discharge: 2017-06-26 | Disposition: A | Discharge status: Home / Self Care | Payer: Medicare HMO | Source: Ambulatory Visit | Attending: General Surgery | Admitting: General Surgery

## 2017-06-26 DIAGNOSIS — C50512 Malignant neoplasm of lower-outer quadrant of left female breast: Secondary | ICD-10-CM

## 2017-06-26 DIAGNOSIS — Z87891 Personal history of nicotine dependence: Secondary | ICD-10-CM | POA: Insufficient documentation

## 2017-06-26 DIAGNOSIS — J449 Chronic obstructive pulmonary disease, unspecified: Secondary | ICD-10-CM | POA: Insufficient documentation

## 2017-06-26 DIAGNOSIS — Z7982 Long term (current) use of aspirin: Secondary | ICD-10-CM | POA: Diagnosis not present

## 2017-06-26 DIAGNOSIS — Z791 Long term (current) use of non-steroidal anti-inflammatories (NSAID): Secondary | ICD-10-CM | POA: Diagnosis not present

## 2017-06-26 DIAGNOSIS — I10 Essential (primary) hypertension: Secondary | ICD-10-CM | POA: Diagnosis not present

## 2017-06-26 DIAGNOSIS — I110 Hypertensive heart disease with heart failure: Secondary | ICD-10-CM | POA: Diagnosis not present

## 2017-06-26 DIAGNOSIS — Z171 Estrogen receptor negative status [ER-]: Secondary | ICD-10-CM | POA: Insufficient documentation

## 2017-06-26 DIAGNOSIS — Z4682 Encounter for fitting and adjustment of non-vascular catheter: Secondary | ICD-10-CM | POA: Diagnosis not present

## 2017-06-26 DIAGNOSIS — Z419 Encounter for procedure for purposes other than remedying health state, unspecified: Secondary | ICD-10-CM

## 2017-06-26 DIAGNOSIS — Z452 Encounter for adjustment and management of vascular access device: Secondary | ICD-10-CM | POA: Diagnosis not present

## 2017-06-26 DIAGNOSIS — G8918 Other acute postprocedural pain: Secondary | ICD-10-CM | POA: Diagnosis not present

## 2017-06-26 DIAGNOSIS — C50912 Malignant neoplasm of unspecified site of left female breast: Secondary | ICD-10-CM | POA: Diagnosis not present

## 2017-06-26 HISTORY — PX: PORTACATH PLACEMENT: SHX2246

## 2017-06-26 HISTORY — PX: BREAST LUMPECTOMY: SHX2

## 2017-06-26 HISTORY — PX: BREAST LUMPECTOMY WITH RADIOACTIVE SEED AND SENTINEL LYMPH NODE BIOPSY: SHX6550

## 2017-06-26 SURGERY — BREAST LUMPECTOMY WITH RADIOACTIVE SEED AND SENTINEL LYMPH NODE BIOPSY
Anesthesia: General | Site: Chest

## 2017-06-26 MED ORDER — DEXAMETHASONE SODIUM PHOSPHATE 10 MG/ML IJ SOLN
INTRAMUSCULAR | Status: DC | PRN
  Administered 2017-06-26: 10 mg via INTRAVENOUS

## 2017-06-26 MED ORDER — SODIUM CHLORIDE 0.9% FLUSH: 3.0000 mL | INTRAVENOUS | Status: DC | PRN

## 2017-06-26 MED ORDER — MIDAZOLAM HCL 2 MG/2ML IJ SOLN
2.0000 mg | Freq: Once | INTRAMUSCULAR | Status: AC
  Administered 2017-06-26: 2 mg via INTRAVENOUS
  Filled 2017-06-26: qty 2

## 2017-06-26 MED ORDER — CIPROFLOXACIN IN D5W 400 MG/200ML IV SOLN
INTRAVENOUS | Status: AC
  Filled 2017-06-26: qty 200

## 2017-06-26 MED ORDER — LIDOCAINE 2% (20 MG/ML) 5 ML SYRINGE
INTRAMUSCULAR | Status: DC | PRN
  Administered 2017-06-26: 100 mg via INTRAVENOUS

## 2017-06-26 MED ORDER — HYDROMORPHONE HCL 1 MG/ML IJ SOLN
0.2500 mg | INTRAMUSCULAR | Status: DC | PRN
  Administered 2017-06-26 (×4): 0.5 mg via INTRAVENOUS

## 2017-06-26 MED ORDER — ONDANSETRON HCL 4 MG/2ML IJ SOLN
INTRAMUSCULAR | Status: AC
  Filled 2017-06-26: qty 2

## 2017-06-26 MED ORDER — SODIUM CHLORIDE 0.9 % IJ SOLN
INTRAMUSCULAR | Status: AC
  Filled 2017-06-26: qty 10

## 2017-06-26 MED ORDER — BUPIVACAINE-EPINEPHRINE (PF) 0.25% -1:200000 IJ SOLN
INTRAMUSCULAR | Status: AC
  Filled 2017-06-26: qty 30

## 2017-06-26 MED ORDER — CIPROFLOXACIN IN D5W 400 MG/200ML IV SOLN
400.0000 mg | INTRAVENOUS | Status: AC
  Administered 2017-06-26: 400 mg via INTRAVENOUS

## 2017-06-26 MED ORDER — ACETAMINOPHEN 325 MG PO TABS
650.0000 mg | ORAL_TABLET | ORAL | Status: DC | PRN
Start: 2017-06-26 — End: 2017-06-26

## 2017-06-26 MED ORDER — SODIUM CHLORIDE 0.9% FLUSH: 3.0000 mL | Freq: Two times a day (BID) | INTRAVENOUS | Status: DC

## 2017-06-26 MED ORDER — FENTANYL CITRATE (PF) 100 MCG/2ML IJ SOLN
INTRAMUSCULAR | Status: AC
  Administered 2017-06-26: 50 ug via INTRAVENOUS
  Filled 2017-06-26: qty 2

## 2017-06-26 MED ORDER — METHYLENE BLUE 0.5 % INJ SOLN
INTRAVENOUS | Status: AC
  Filled 2017-06-26: qty 10

## 2017-06-26 MED ORDER — CHLORHEXIDINE GLUCONATE CLOTH 2 % EX PADS: 6.0000 | MEDICATED_PAD | Freq: Once | CUTANEOUS | Status: DC

## 2017-06-26 MED ORDER — OXYCODONE HCL 5 MG PO TABS
ORAL_TABLET | ORAL | Status: AC
  Filled 2017-06-26: qty 2

## 2017-06-26 MED ORDER — MEPERIDINE HCL 25 MG/ML IJ SOLN: 6.2500 mg | INTRAMUSCULAR | Status: DC | PRN

## 2017-06-26 MED ORDER — HEPARIN SOD (PORK) LOCK FLUSH 100 UNIT/ML IV SOLN
INTRAVENOUS | Status: DC | PRN
  Administered 2017-06-26: 500 [IU] via INTRAVENOUS

## 2017-06-26 MED ORDER — TECHNETIUM TC 99M SULFUR COLLOID FILTERED
1.0000 | Freq: Once | INTRAVENOUS | Status: AC | PRN
  Administered 2017-06-26: 1 via INTRADERMAL

## 2017-06-26 MED ORDER — FENTANYL CITRATE (PF) 250 MCG/5ML IJ SOLN
INTRAMUSCULAR | Status: AC
  Filled 2017-06-26: qty 5

## 2017-06-26 MED ORDER — LACTATED RINGERS IV SOLN
INTRAVENOUS | Status: DC
  Administered 2017-06-26 (×3): via INTRAVENOUS

## 2017-06-26 MED ORDER — HYDROMORPHONE HCL 1 MG/ML IJ SOLN
INTRAMUSCULAR | Status: AC
  Filled 2017-06-26: qty 1

## 2017-06-26 MED ORDER — MIDAZOLAM HCL 2 MG/2ML IJ SOLN
INTRAMUSCULAR | Status: AC
  Administered 2017-06-26: 2 mg via INTRAVENOUS
  Filled 2017-06-26: qty 2

## 2017-06-26 MED ORDER — EPHEDRINE 5 MG/ML INJ
INTRAVENOUS | Status: AC
  Filled 2017-06-26: qty 10

## 2017-06-26 MED ORDER — SODIUM CHLORIDE 0.9 % IV SOLN
INTRAVENOUS | Status: DC | PRN
  Administered 2017-06-26: 10:00:00

## 2017-06-26 MED ORDER — OXYCODONE HCL 5 MG PO TABS
5.0000 mg | ORAL_TABLET | ORAL | Status: DC | PRN
  Administered 2017-06-26: 10 mg via ORAL

## 2017-06-26 MED ORDER — ACETAMINOPHEN 500 MG PO TABS
1000.0000 mg | ORAL_TABLET | ORAL | Status: AC
  Administered 2017-06-26: 1000 mg via ORAL

## 2017-06-26 MED ORDER — OXYCODONE HCL 5 MG PO TABS: 5.0000 mg | ORAL_TABLET | Freq: Four times a day (QID) | ORAL | 0 refills | Status: DC | PRN

## 2017-06-26 MED ORDER — PROPOFOL 10 MG/ML IV BOLUS
INTRAVENOUS | Status: DC | PRN
  Administered 2017-06-26: 150 mg via INTRAVENOUS

## 2017-06-26 MED ORDER — PROPOFOL 10 MG/ML IV BOLUS
INTRAVENOUS | Status: AC
  Filled 2017-06-26: qty 20

## 2017-06-26 MED ORDER — ACETAMINOPHEN 500 MG PO TABS
ORAL_TABLET | ORAL | Status: AC
  Administered 2017-06-26: 1000 mg via ORAL
  Filled 2017-06-26: qty 2

## 2017-06-26 MED ORDER — GABAPENTIN 300 MG PO CAPS
300.0000 mg | ORAL_CAPSULE | ORAL | Status: AC
  Administered 2017-06-26: 300 mg via ORAL

## 2017-06-26 MED ORDER — LIDOCAINE HCL (PF) 1 % IJ SOLN
INTRAMUSCULAR | Status: AC
  Filled 2017-06-26: qty 30

## 2017-06-26 MED ORDER — FENTANYL CITRATE (PF) 100 MCG/2ML IJ SOLN
INTRAMUSCULAR | Status: DC | PRN
  Administered 2017-06-26 (×8): 25 ug via INTRAVENOUS

## 2017-06-26 MED ORDER — SODIUM CHLORIDE 0.9 % IV SOLN: 250.0000 mL | INTRAVENOUS | Status: DC | PRN

## 2017-06-26 MED ORDER — ONDANSETRON HCL 4 MG/2ML IJ SOLN: 4.0000 mg | Freq: Once | INTRAMUSCULAR | Status: DC | PRN

## 2017-06-26 MED ORDER — LIDOCAINE 2% (20 MG/ML) 5 ML SYRINGE
INTRAMUSCULAR | Status: AC
  Filled 2017-06-26: qty 5

## 2017-06-26 MED ORDER — ACETAMINOPHEN 650 MG RE SUPP: 650.0000 mg | RECTAL | Status: DC | PRN

## 2017-06-26 MED ORDER — HYDROMORPHONE HCL 1 MG/ML IJ SOLN
INTRAMUSCULAR | Status: AC
  Administered 2017-06-26: 0.5 mg via INTRAVENOUS
  Filled 2017-06-26: qty 1

## 2017-06-26 MED ORDER — EPHEDRINE SULFATE 50 MG/ML IJ SOLN
INTRAMUSCULAR | Status: DC | PRN
  Administered 2017-06-26: 10 mg via INTRAVENOUS

## 2017-06-26 MED ORDER — LIDOCAINE HCL 1 % IJ SOLN
INTRAMUSCULAR | Status: DC | PRN
  Administered 2017-06-26: 35 mL via INTRAMUSCULAR

## 2017-06-26 MED ORDER — 0.9 % SODIUM CHLORIDE (POUR BTL) OPTIME
TOPICAL | Status: DC | PRN
  Administered 2017-06-26: 1000 mL

## 2017-06-26 MED ORDER — FENTANYL CITRATE (PF) 100 MCG/2ML IJ SOLN
50.0000 ug | Freq: Once | INTRAMUSCULAR | Status: AC
  Administered 2017-06-26: 50 ug via INTRAVENOUS
  Filled 2017-06-26: qty 1

## 2017-06-26 MED ORDER — MIDAZOLAM HCL 2 MG/2ML IJ SOLN
INTRAMUSCULAR | Status: AC
  Filled 2017-06-26: qty 2

## 2017-06-26 MED ORDER — DEXAMETHASONE SODIUM PHOSPHATE 10 MG/ML IJ SOLN
INTRAMUSCULAR | Status: AC
  Filled 2017-06-26: qty 1

## 2017-06-26 MED ORDER — GABAPENTIN 300 MG PO CAPS
ORAL_CAPSULE | ORAL | Status: AC
  Administered 2017-06-26: 300 mg via ORAL
  Filled 2017-06-26: qty 1

## 2017-06-26 MED ORDER — ONDANSETRON HCL 4 MG/2ML IJ SOLN
INTRAMUSCULAR | Status: DC | PRN
  Administered 2017-06-26: 4 mg via INTRAVENOUS

## 2017-06-26 MED ORDER — HEPARIN SOD (PORK) LOCK FLUSH 100 UNIT/ML IV SOLN
INTRAVENOUS | Status: AC
  Filled 2017-06-26: qty 5

## 2017-06-26 SURGICAL SUPPLY — 73 items
BAG DECANTER FOR FLEXI CONT (MISCELLANEOUS) ×3 IMPLANT
BINDER BREAST LRG (GAUZE/BANDAGES/DRESSINGS) IMPLANT
BINDER BREAST XLRG (GAUZE/BANDAGES/DRESSINGS) IMPLANT
BLADE HEX COATED 2.75 (ELECTRODE) ×3 IMPLANT
BLADE SURG 10 STRL SS (BLADE) IMPLANT
BLADE SURG 11 STRL SS (BLADE) ×3 IMPLANT
BLADE SURG 15 STRL LF DISP TIS (BLADE) ×2 IMPLANT
BLADE SURG 15 STRL SS (BLADE) ×1
BNDG COHESIVE 4X5 TAN STRL (GAUZE/BANDAGES/DRESSINGS) ×3 IMPLANT
CANISTER SUCT 3000ML PPV (MISCELLANEOUS) ×3 IMPLANT
CHLORAPREP W/TINT 10.5 ML (MISCELLANEOUS) IMPLANT
CHLORAPREP W/TINT 26ML (MISCELLANEOUS) ×3 IMPLANT
CLIP VESOCCLUDE LG 6/CT (CLIP) ×3 IMPLANT
CLIP VESOCCLUDE MED 6/CT (CLIP) ×12 IMPLANT
CLIP VESOCCLUDE SM WIDE 6/CT (CLIP) ×3 IMPLANT
CONT SPEC 4OZ CLIKSEAL STRL BL (MISCELLANEOUS) IMPLANT
COVER PROBE W GEL 5X96 (DRAPES) ×3 IMPLANT
COVER SURGICAL LIGHT HANDLE (MISCELLANEOUS) ×3 IMPLANT
COVER TRANSDUCER ULTRASND GEL (DRAPE) IMPLANT
CRADLE DONUT ADULT HEAD (MISCELLANEOUS) ×3 IMPLANT
DECANTER SPIKE VIAL GLASS SM (MISCELLANEOUS) IMPLANT
DERMABOND ADVANCED (GAUZE/BANDAGES/DRESSINGS) ×2
DERMABOND ADVANCED .7 DNX12 (GAUZE/BANDAGES/DRESSINGS) ×4 IMPLANT
DEVICE DUBIN SPECIMEN MAMMOGRA (MISCELLANEOUS) ×3 IMPLANT
DRAPE C-ARM 42X72 X-RAY (DRAPES) ×3 IMPLANT
DRAPE CHEST BREAST 15X10 FENES (DRAPES) ×3 IMPLANT
DRAPE UNIVERSAL PACK (DRAPES) ×3 IMPLANT
DRAPE UTILITY XL STRL (DRAPES) ×3 IMPLANT
DRAPE WARM FLUID 44X44 (DRAPE) IMPLANT
ELECT CAUTERY BLADE 6.4 (BLADE) ×3 IMPLANT
ELECT COATED BLADE 2.86 ST (ELECTRODE) ×3 IMPLANT
ELECT REM PT RETURN 9FT ADLT (ELECTROSURGICAL) ×3
ELECTRODE REM PT RTRN 9FT ADLT (ELECTROSURGICAL) ×2 IMPLANT
GAUZE SPONGE 4X4 16PLY XRAY LF (GAUZE/BANDAGES/DRESSINGS) ×3 IMPLANT
GEL ULTRASOUND 20GR AQUASONIC (MISCELLANEOUS) IMPLANT
GLOVE BIO SURGEON STRL SZ 6 (GLOVE) ×3 IMPLANT
GLOVE BIOGEL PI IND STRL 6.5 (GLOVE) ×2 IMPLANT
GLOVE BIOGEL PI INDICATOR 6.5 (GLOVE) ×1
GOWN STRL REUS W/ TWL LRG LVL3 (GOWN DISPOSABLE) ×2 IMPLANT
GOWN STRL REUS W/TWL 2XL LVL3 (GOWN DISPOSABLE) ×6 IMPLANT
GOWN STRL REUS W/TWL LRG LVL3 (GOWN DISPOSABLE) ×1
KIT BASIN OR (CUSTOM PROCEDURE TRAY) ×3 IMPLANT
KIT MARKER MARGIN INK (KITS) ×3 IMPLANT
KIT PORT POWER 8FR ISP CVUE (Miscellaneous) ×3 IMPLANT
KIT ROOM TURNOVER OR (KITS) ×3 IMPLANT
LIGHT WAVEGUIDE WIDE FLAT (MISCELLANEOUS) ×3 IMPLANT
NDL SAFETY ECLIPSE 18X1.5 (NEEDLE) IMPLANT
NEEDLE FILTER BLUNT 18X 1/2SAF (NEEDLE)
NEEDLE FILTER BLUNT 18X1 1/2 (NEEDLE) IMPLANT
NEEDLE HYPO 18GX1.5 SHARP (NEEDLE)
NEEDLE HYPO 25GX1X1/2 BEV (NEEDLE) ×3 IMPLANT
NS IRRIG 1000ML POUR BTL (IV SOLUTION) ×3 IMPLANT
PACK SURGICAL SETUP 50X90 (CUSTOM PROCEDURE TRAY) ×3 IMPLANT
PAD ARMBOARD 7.5X6 YLW CONV (MISCELLANEOUS) ×3 IMPLANT
PENCIL BUTTON HOLSTER BLD 10FT (ELECTRODE) ×3 IMPLANT
SPONGE LAP 18X18 X RAY DECT (DISPOSABLE) ×3 IMPLANT
STOCKINETTE IMPERVIOUS 9X36 MD (GAUZE/BANDAGES/DRESSINGS) ×3 IMPLANT
SUT MNCRL AB 4-0 PS2 18 (SUTURE) ×9 IMPLANT
SUT MON AB 4-0 PC3 18 (SUTURE) IMPLANT
SUT PROLENE 2 0 SH DA (SUTURE) ×6 IMPLANT
SUT VIC AB 2-0 SH 27 (SUTURE) ×1
SUT VIC AB 2-0 SH 27XBRD (SUTURE) ×2 IMPLANT
SUT VIC AB 3-0 SH 27 (SUTURE) ×1
SUT VIC AB 3-0 SH 27X BRD (SUTURE) ×2 IMPLANT
SUT VIC AB 3-0 SH 8-18 (SUTURE) ×3 IMPLANT
SYR 20ML ECCENTRIC (SYRINGE) ×6 IMPLANT
SYR 5ML LUER SLIP (SYRINGE) ×3 IMPLANT
SYR BULB 3OZ (MISCELLANEOUS) ×3 IMPLANT
SYR CONTROL 10ML LL (SYRINGE) ×3 IMPLANT
TOWEL OR 17X24 6PK STRL BLUE (TOWEL DISPOSABLE) ×3 IMPLANT
TOWEL OR 17X26 10 PK STRL BLUE (TOWEL DISPOSABLE) ×3 IMPLANT
TUBE CONNECTING 12X1/4 (SUCTIONS) ×3 IMPLANT
YANKAUER SUCT BULB TIP NO VENT (SUCTIONS) ×3 IMPLANT

## 2017-06-26 NOTE — Discharge Instructions (Addendum)
[facility] °Office Phone Number [phone] ° °BREAST BIOPSY/ PARTIAL MASTECTOMY: POST OP INSTRUCTIONS ° °Always review your discharge instruction sheet given to you by the facility where your surgery was performed. ° °IF YOU HAVE DISABILITY OR FAMILY LEAVE FORMS, YOU MUST BRING THEM TO THE OFFICE FOR PROCESSING.  DO NOT GIVE THEM TO YOUR DOCTOR. ° °1. A prescription for pain medication may be given to you upon discharge.  Take your pain medication as prescribed, if needed.  If narcotic pain medicine is not needed, then you may take acetaminophen (Tylenol) or ibuprofen (Advil) as needed. °2. Take your usually prescribed medications unless otherwise directed °3. If you need a refill on your pain medication, please contact your pharmacy.  They will contact our office to request authorization.  Prescriptions will not be filled after 5pm or on week-ends. °4. You should eat very light the first 24 hours after surgery, such as soup, crackers, pudding, etc.  Resume your normal diet the day after surgery. °5. Most patients will experience some swelling and bruising in the breast.  Ice packs and a good support bra will help.  Swelling and bruising can take several days to resolve.  °6. It is common to experience some constipation if taking pain medication after surgery.  Increasing fluid intake and taking a stool softener will usually help or prevent this problem from occurring.  A mild laxative (Milk of Magnesia or Miralax) should be taken according to package directions if there are no bowel movements after 48 hours. °7. Unless discharge instructions indicate otherwise, you may remove your bandages 48 hours after surgery, and you may shower at that time.  You may have steri-strips (small skin tapes) in place directly over the incision.  These strips should be left on the skin for 7-10 days.   Any sutures or staples will be removed at the office during your follow-up visit. °8. ACTIVITIES:  You may resume  regular daily activities (gradually increasing) beginning the next day.  Wearing a good support bra or sports bra (or the breast binder) minimizes pain and swelling.  You may have sexual intercourse when it is comfortable. °a. You may drive when you no longer are taking prescription pain medication, you can comfortably wear a seatbelt, and you can safely maneuver your car and apply brakes. °b. RETURN TO WORK:  __________1 week_______________ °9. You should see your doctor in the office for a follow-up appointment approximately two weeks after your surgery.  Your doctor’s nurse will typically make your follow-up appointment when she calls you with your pathology report.  Expect your pathology report 2-3 business days after your surgery.  You may call to check if you do not hear from us after three days. ° ° °WHEN TO CALL YOUR DOCTOR: °1. Fever over 101.0 °2. Nausea and/or vomiting. °3. Extreme swelling or bruising. °4. Continued bleeding from incision. °5. Increased pain, redness, or drainage from the incision. ° °The clinic staff is available to answer your questions during regular business hours.  Please don’t hesitate to call and ask to speak to one of the nurses for clinical concerns.  If you have a medical emergency, go to the nearest emergency room or call 911.  A surgeon from [facility] is always on call at the hospital. ° °For further questions, please visit [email or web address]  ° °

## 2017-06-26 NOTE — Op Note (Signed)
Left Breast Radioactive seed localized lumpectomy and sentinel lymph node biopsy, right subclavian port placement  Indications: This patient presents with history of left breast cancer, LOQ, cT1cN0, g3, triple negative, Ki67 70%  Pre-operative Diagnosis: left breast cancer  Post-operative Diagnosis: Same  Surgeon: [name]   ASSISTANT: Judyann Munson, RNFA  Anesthesia: General endotracheal anesthesia  ASA Class: 2  Procedure Details  The patient was seen in the Holding Room. The risks, benefits, complications, treatment options, and expected outcomes were discussed with the patient. The possibilities of bleeding, infection, the need for additional procedures, failure to diagnose a condition, and creating a complication requiring transfusion or operation were discussed with the patient. The patient concurred with the proposed plan, giving informed consent.  The site of surgery properly noted/marked. The patient was taken to Operating Room # 2, identified, and the procedure verified as Left Breast Seed localized Lumpectomy with sentinel lymph node biopsy. A Time Out was held and the above information confirmed.  The left arm, breast, and chest were prepped and draped in standard fashion. The lumpectomy was performed by creating a vertical incision below the nipple over the previously placed radioactive seed.  Dissection was carried down to around the point of maximum signal intensity. The cautery was used to perform the dissection.  Hemostasis was achieved with cautery. The edges of the cavity were marked with large clips, with one each medial, lateral, inferior and superior, and two clips posteriorly.   The specimen was inked with the margin marker paint kit.    Specimen radiography confirmed inclusion of the mammographic lesion, the clip, and the seed.  The background signal in the breast was zero.  The wound was irrigated and closed with 3-0 vicryl in layers and 4-0 monocryl subcuticular  suture.    Using a hand-held gamma probe, left axillary sentinel nodes were identified transcutaneously.  An oblique incision was created below the axillary hairline.  Dissection was carried through the clavipectoral fascia.  Three deep level 2 axillary sentinel nodes were removed.  Counts per second were 690, 220, and 130.    The background count was 5 cps.  The wound was irrigated.  Hemostasis was achieved with cautery.  The axillary incision was closed with a 3-0 vicryl deep dermal interrupted sutures and a 4-0 monocryl subcuticular closure.    Local anesthetic was administered over this   area at the angle of the clavicle.  The vein was accessed with 2 pass(es) of the needle. There was good venous return and the wire passed easily with no ectopy.   Fluoroscopy was used to confirm that the wire was in the vena cava.      The patient was placed back level and the area for the pocket was anethetized   with local anesthetic.  A 3-cm transverse incision was made with a #15   blade.  Cautery was used to divide the subcutaneous tissues down to the   pectoralis muscle.  An [employer] retractor was used to elevate the skin   while a pocket was created on top of the pectoralis fascia.  The port   was placed into the pocket to confirm that it was of adequate size.  The   catheter was preattached to the port.  The port was then secured to the   pectoralis fascia with four 2-0 Prolene sutures.  These were clamped and   not tied down yet.    The catheter was tunneled through to the wire exit   site.  The catheter was placed along the wire to determine what length it should be to be in the SVC.  The catheter was cut at 16 cm.  The tunneler sheath and dilator were passed over the wire and the dilator and wire were removed.  The catheter was advanced through the tunneler sheath and the tunneler sheath was pulled away.  Care was taken to keep the catheter in the tunneler sheath as this occurred. This was  advanced and the tunneler sheath was removed.  There was good venous   return and easy flush of the catheter.  The Prolene sutures were tied   down to the pectoral fascia.  The skin was reapproximated using 3-0   Vicryl interrupted deep dermal sutures.    Fluoroscopy was used to re-confirm good position of the catheter.  The skin   was then closed using 4-0 Monocryl in a subcuticular fashion.  The port was flushed with concentrated heparin flush as well.  The wounds were then cleaned, dried, and dressed with Dermabond.  Sterile dressings were applied. At the end of the operation, all sponge, instrument, and needle counts were correct.  Findings: grossly clear surgical margins and no adenopathy  Estimated Blood Loss:  min         Specimens: left breast lumpectomy and three axillary sentinel lymph nodes.             Complications:  None; patient tolerated the procedure well.         Disposition: PACU - hemodynamically stable.         Condition: stable

## 2017-06-26 NOTE — Anesthesia Postprocedure Evaluation (Signed)
Anesthesia Post Note  Patient: Kristin Ward  Procedure(s) Performed: BREAST LUMPECTOMY WITH RADIOACTIVE SEED AND SENTINEL LYMPH NODE BIOPSY ERAS  PATHWAY (Left Breast) INSERTION PORT-A-CATH (N/A Chest)     Patient location during evaluation: PACU Anesthesia Type: General Level of consciousness: awake and alert Pain management: pain level controlled Vital Signs Assessment: post-procedure vital signs reviewed and stable Respiratory status: spontaneous breathing, nonlabored ventilation, respiratory function stable and patient connected to nasal cannula oxygen Cardiovascular status: blood pressure returned to baseline and stable Postop Assessment: no apparent nausea or vomiting Anesthetic complications: no    Last Vitals:  Vitals:   06/26/17 1315 06/26/17 1324  BP: (!) 155/88 (!) 141/89  Pulse: 63 70  Resp: 17 16  Temp: 36.5 C   SpO2: 95% 97%    Last Pain:  Vitals:   06/26/17 1315  PainSc: 4                  [name] DAVID

## 2017-06-26 NOTE — Transfer of Care (Signed)
Immediate Anesthesia Transfer of Care Note  Patient: Kristin Ward  Procedure(s) Performed: BREAST LUMPECTOMY WITH RADIOACTIVE SEED AND SENTINEL LYMPH NODE BIOPSY ERAS  PATHWAY (Left Breast) INSERTION PORT-A-CATH (N/A Chest)  Patient Location: PACU  Anesthesia Type:GA combined with regional for post-op pain  Level of Consciousness: drowsy  Airway & Oxygen Therapy: Patient Spontanous Breathing and Patient connected to nasal cannula oxygen  Post-op Assessment: Report given to RN, Post -op Vital signs reviewed and stable and Patient moving all extremities X 4  Post vital signs: Reviewed and stable  Last Vitals:  Vitals:   06/26/17 0925 06/26/17 1201  BP: (!) 170/104 (!) 150/91  Pulse: 66 62  Resp: (!) 24 10  Temp:  (!) 36.1 C  SpO2: 100% 98%    Last Pain: There were no vitals filed for this visit.    Patients Stated Pain Goal: 3 (38/88/28 0034)  Complications: No apparent anesthesia complications

## 2017-06-26 NOTE — Anesthesia Preprocedure Evaluation (Signed)
Anesthesia Evaluation  Patient identified by MRN, date of birth, ID band Patient awake    Reviewed: Allergy & Precautions, NPO status , Patient's Chart, lab work & pertinent test results  Airway Mallampati: I  TM Distance: >3 FB Neck ROM: Full    Dental   Pulmonary COPD, former smoker,    Pulmonary exam normal        Cardiovascular hypertension, Pt. on medications Normal cardiovascular exam     Neuro/Psych    GI/Hepatic   Endo/Other    Renal/GU      Musculoskeletal   Abdominal   Peds  Hematology   Anesthesia Other Findings   Reproductive/Obstetrics                             Anesthesia Physical Anesthesia Plan  ASA: II  Anesthesia Plan: General   Post-op Pain Management:  Regional for Post-op pain   Induction: Intravenous  PONV Risk Score and Plan: 3 and Midazolam, Dexamethasone and Ondansetron  Airway Management Planned: LMA  Additional Equipment:   Intra-op Plan:   Post-operative Plan: Extubation in OR  Informed Consent: I have reviewed the patients History and Physical, chart, labs and discussed the procedure including the risks, benefits and alternatives for the proposed anesthesia with the patient or authorized representative who has indicated his/her understanding and acceptance.     Plan Discussed with: CRNA and Surgeon  Anesthesia Plan Comments:         Anesthesia Quick Evaluation

## 2017-06-26 NOTE — Interval H&P Note (Signed)
History and Physical Interval Note:  06/26/2017 9:16 AM  Kristin Ward  has presented today for surgery, with the diagnosis of left breast cancer  The various methods of treatment have been discussed with the patient and family. After consideration of risks, benefits and other options for treatment, the patient has consented to  Procedure(s) with comments: BREAST LUMPECTOMY WITH RADIOACTIVE SEED AND SENTINEL LYMPH NODE BIOPSY ERAS  PATHWAY (Left) - pec block INSERTION PORT-A-CATH (N/A) as a surgical intervention .  The patient's history has been reviewed, patient examined, no change in status, stable for surgery.  I have reviewed the patient's chart and labs.  Questions were answered to the patient's satisfaction.     [name]

## 2017-06-26 NOTE — Anesthesia Procedure Notes (Signed)
Anesthesia Regional Block: Pectoralis block   Pre-Anesthetic Checklist: ,, timeout performed, Correct Patient, Correct Site, Correct Laterality, Correct Procedure, Correct Position, site marked, Risks and benefits discussed,  Surgical consent,  Pre-op evaluation,  At surgeon's request and post-op pain management  Laterality: Left  Prep: chloraprep       Needles:  Injection technique: Single-shot     Needle Length: 9cm  Needle Gauge: 21     Additional Needles:   Narrative:  Start time: 06/26/2017 9:10 AM End time: 06/26/2017 9:20 AM Injection made incrementally with aspirations every 5 mL.  Performed by: Personally  Anesthesiologist: Lillia Abed, MD  Additional Notes: Monitors applied. Patient sedated. Sterile prep and drape,hand hygiene and sterile gloves were used. Relevant anatomy identified.Needle position confirmed.Local anesthetic injected incrementally after negative aspiration. Local anesthetic spread visualized. Vascular puncture avoided. No complications. Image printed for medical record.The patient tolerated the procedure well.

## 2017-06-26 NOTE — Progress Notes (Signed)
Nuclear medicine contacted for pt's procedure, spoke with [facility].

## 2017-06-27 ENCOUNTER — Encounter (HOSPITAL_COMMUNITY): Payer: Self-pay | Admitting: General Surgery

## 2017-06-28 ENCOUNTER — Telehealth: Payer: Self-pay | Admitting: Hematology

## 2017-06-28 NOTE — Telephone Encounter (Signed)
Spoke with patient regarding appt added per 11/15 sch msg.

## 2017-07-01 NOTE — Progress Notes (Signed)
Please let patient know margins and lymph nodes are negative.

## 2017-07-07 ENCOUNTER — Encounter: Payer: Self-pay | Admitting: Genetic Counselor

## 2017-07-07 ENCOUNTER — Ambulatory Visit: Payer: Self-pay | Admitting: Genetic Counselor

## 2017-07-07 DIAGNOSIS — Z1379 Encounter for other screening for genetic and chromosomal anomalies: Secondary | ICD-10-CM

## 2017-07-07 NOTE — Progress Notes (Signed)
Cancer Genetics Clinic       Genetic Test Results    Patient Name: Kristin Ward Patient DOB: [birth year] Patient Age: 60 y.o. Encounter Date: 07/07/2017  Referring Provider: [name], MD  Primary Care Provider: Girtha Rm, NP-C   Kristin Ward was called today to discuss genetic test results. Please see the Genetics note from her visit on 06/19/2017 for a detailed discussion of her personal and family history.  Genetic Testing: At the time of Kristin Ward's visit, she decided to pursue genetic testing of multiple genes associated with hereditary susceptibility to cancer. Testing included sequencing and deletion/duplication analysis. Testing did not reveal any pathogenic mutation in any of these genes.  A copy of the genetic test report will be scanned into Epic under the media tab.  The genes analyzed were the 83 genes on [facility]'s Multi-Cancer panel (ALK, APC, ATM, AXIN2, BAP1, BARD1, BLM, BMPR1A, BRCA1, BRCA2, BRIP1, CASR, CDC73, CDH1, CDK4, CDKN1B, CDKN1C, CDKN2A, CEBPA, CHEK2, CTNNA1, DICER1, DIS3L2, EGFR, EPCAM, FH, FLCN, GATA2, GPC3, GREM1, HOXB13, HRAS, KIT, MAX, MEN1, MET, MITF, MLH1, MSH2, MSH3, MSH6, MUTYH, NBN, NF1, NF2, NTHL1, PALB2, PDGFRA, PHOX2B, PMS2, POLD1, POLE, POT1, PRKAR1A, PTCH1, PTEN, RAD50, RAD51C, RAD51D, RB1, RECQL4, RET, RUNX1, SDHA, SDHAF2, SDHB, SDHC, SDHD, SMAD4, SMARCA4, SMARCB1, SMARCE1, STK11, SUFU, TERC, TERT, TMEM127, TP53, TSC1, TSC2, VHL, WRN, WT1).  Since the current test is not perfect, it is possible that there may be a gene mutation that current testing cannot detect, but that chance is small. It is possible that a different genetic factor, which has not yet been discovered or is not on this panel, is responsible for the cancer diagnoses in the family. Again, the likelihood of this is low. No additional testing is recommended at this time for Kristin Ward.  Cancer Screening: These results suggest that Kristin Ward' cancer was  most likely not due to an inherited predisposition. Most cancers happen by chance and this test, along with details of her family history, suggests that her cancer falls into this category. We discussed continuing to follow the cancer screening guidelines provided by her physician.   Family Members: Family members are at some increased risk of developing cancer, over the general population risk, simply due to the family history. Women are recommended to have a yearly mammogram beginning at age 58, a yearly clinical breast exam, a yearly gynecologic exam and perform monthly breast self-exams. Colon cancer screening is recommended to begin by age 81 in both men and women, unless there is a family history of colon cancer or colon polyps or an individual has a personal history to warrant initiating screening at a younger age.  Any relative who had cancer at a young age or had a particularly rare cancer may also wish to pursue genetic testing. Genetic counselors can be located in other cities, by visiting the website of the [employer] (Artist[employer]se) and Field seismologist for a Dietitian by zip code.    Lastly, cancer genetics is a rapidly advancing field and it is possible that new genetic tests will be appropriate for Kristin Ward in the future. We encourage her to remain in contact with [place] on an annual basis so we can update her personal and family histories, and let her know of advances in cancer genetics that may benefit the family. Our contact number was provided. Kristin Ward is welcome to call anytime with additional  questions.     Steele Berg, MS, [place] Certified Genetic Counselor phone: [phone]

## 2017-07-08 ENCOUNTER — Telehealth: Payer: Self-pay | Admitting: Obstetrics and Gynecology

## 2017-07-08 NOTE — Telephone Encounter (Signed)
Patient is 61 yo AA female with no pap x 20 years. Recently diagnosed with grade 3 triple negative left breast cancer, s/p lumpectomy and due to start chemo. Recently had abnormal pap for which she was referred to [facility].  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 She had previously been recommended for LEEP. Reviewed that per ASCCP guidelines (2012 Updated Consensus Guidelines for the Management of Abnormal Cervical Cancer Screening Tests and Cancer Precursors), a positive ECC for CIN1 with no other abnormalities identified and an adequate colposcopy, co-testing at one year is recommended. Reviewed that with no prior abnormal pap and no higher grade lesions noted, this is a reasonable option. The guidelines do note that a repeat ECC is recommended at one year in conjunction with cytology/HPV testing. Also gave her option of cryotherapy but she opts to go with repeat cytology. She verbalizes understanding that while most low grade lesions regress, it may not, and it is important that she return for follow up [place] 03/2018. She is agreeable to plan, answered all questions.    Feliz Beam, M.D. Attending [place], [facility] for [employer], [place]

## 2017-07-09 NOTE — Progress Notes (Signed)
[facility]  [phone] [phone]) [phone]  Clinic Follow up Note   Patient Care Team: Girtha Rm, NP-C as PCP - General (Family Medicine) [name], MD as Consulting Physician (Hematology) [name], MD as Consulting Physician (General Surgery) 07/10/2017  CHIEF COMPLAINTS:  Malignant neoplasm of lower-outer quadrant of left breast of female, triple negative    Oncology History   Cancer Staging Malignant neoplasm of lower-outer quadrant of left breast of female, estrogen receptor negative ([place]) Staging form: Breast, AJCC 8th Edition - Clinical stage from 06/06/2017: Stage IB (cT1c, cN0, cM0, G3, ER: Negative, PR: Negative, HER2: Negative) - Signed by [name], MD on 06/15/2017 - Pathologic stage from 06/26/2017: Stage IB (pT1c, pN0, cM0, G3, ER: Negative, PR: Negative, HER2: Negative) - Signed by [name], MD on 07/10/2017       Malignant neoplasm of lower-outer quadrant of left breast of female, estrogen receptor negative ([place])   05/30/2017 Mammogram    Diagnostic mammo and US IMPRESSION: 1. Highly suspicious 1.4 cm mass in the slightly lower slightly outer left breast -tissue sampling recommended. 2. Indeterminate 0.5 mm mass in the slightly lower slightly outer left breast -tissue sampling recommended. 3. At least 2 left axillary lymph nodes with borderline cortical thickness.      06/06/2017 Initial Biopsy    Diagnosis 1. Breast, left, needle core biopsy, 5:30 o'clock - INVASIVE DUCTAL CARCINOMA, G3 2. Lymph node, needle/core biopsy, left axillary - NO CARCINOMA IDENTIFIED IN ONE LYMPH NODE (0/1)      06/06/2017 Initial Diagnosis    Malignant neoplasm of lower-outer quadrant of left breast of female, estrogen receptor negative ([place])      06/06/2017 Receptors her2    Estrogen Receptor: 0%, NEGATIVE Progesterone Receptor: 0%, NEGATIVE Proliferation Marker Ki67: 70%      06/26/2017 Surgery    LEFT BREAST LUMPECTOMY  WITH RADIOACTIVE SEED AND SENTINEL LYMPH NODE BIOPSY ERAS  PATHWAY AND INSERTION PORT-A-CATH By Dr. Barry Dienes on 06/26/17       06/26/2017 Pathology Results    Diagnosis 06/26/17  1. Breast, lumpectomy, Left - INVASIVE DUCTAL CARCINOMA, GRADE III/III, SPANNING 1.2 CM. - THE SURGICAL RESECTION MARGINS ARE NEGATIVE FOR CARCINOMA. - SEE ONCOLOGY TABLE BELOW. 2. Lymph node, sentinel, biopsy, Left axillary #1 - THERE IS NO EVIDENCE OF CARCINOMA IN 1 OF 1 LYMPH NODE (0/1). 3. Lymph node, sentinel, biopsy, Left axillary #2 - THERE IS NO EVIDENCE OF CARCINOMA IN 1 OF 1 LYMPH NODE (0/1). 4. Lymph node, sentinel, biopsy, Left axillary #3 - THERE IS NO EVIDENCE OF CARCINOMA IN 1 OF 1 LYMPH NODE (0/1).        Chemotherapy    CT every 3 weekd for 4 cycles plan to start on 07/25/17         HISTORY OF PRESENTING ILLNESS: 06/18/17 Kristin Ward Reach 61 y.o. female is here because of newly diagnosed left breast cancer. She presents to Breast Clinic today accompanied by her daughter.   She found this mass by screening mammogram.  She denies palpable breast mass before biopsy.  She thinks her current palpable mass is a keloid post biopsy.  She denies any other new symptoms.  No change of her energy level, weight, or appetite.  In the past she had HTN, possible COPD, and her cousin had breast cancer and her aunt had colon cancer. She quit smoking after 23 years She smoked a pack a week. She is can see moderately and has had cornea surgery. She walks with  a cane. She had retinitis pigmentosa She has medication for this. She has CHF and sees a cardiologist and is on medication, Norvasc and coreg. She had lung nodules last year and she had it monitored.    GYN HISTORY  Menarchal: 11 LMP: 2015 Contraceptive: yes HRT: No G2P2: one son and one daughter    CURRENT THERAPY: PENDING adjuvant chemo TC every 3 weeks for 4 cycles starting 07/25/17   INTERVAL HISTORY:  Aizah Gehlhausen Lemma is here for a  follow up post lumpectomy. She presents to the clinic today accompanied by her daughter.  She notes her surgery went well and her right chest is sore from port and her left breast feels hard and has been massaging it. She is able to moderately lift her left arm. She denies any other troubles from surgery. She denies swelling on Lasix due to her heart condition.    MEDICAL HISTORY:  Past Medical History:  Diagnosis Date  . Abnormal myocardial perfusion study 03/15/2016   EF 15%, severe LV systolic dysfcn, global hypokinesis and inferior akinesis. dilated LV  . Abnormal x-ray of lungs with single pulmonary nodule 03/15/2016   per records from [place]   . Blind   . Cholelithiasis 05/19/2017   On CT  . COPD (chronic obstructive pulmonary disease) ([place])   . Coronary artery calcification seen on CAT scan 05/19/2017  . Dilated idiopathic cardiomyopathy ([place]) 12/2015   EF 15-20%. Diagnosed in [facility]  . Family history of colon cancer   . Genetic testing 06/19/2017   Multi-Cancer panel (83 genes) @ [facility] - No pathogenic mutations detected  . Headache   . Hypertension   . Malignant neoplasm of lower-outer quadrant of left breast of female, estrogen receptor negative ([place]) 06/11/2017  . Retinitis pigmentosa   . Solitary pulmonary nodule on lung CT 03/16/2016   per records from [place]  . Uveitis     SURGICAL HISTORY: Past Surgical History:  Procedure Laterality Date  . brain cyst removed  2007   to help with headaches per patient , aspirated   . BREAST LUMPECTOMY WITH RADIOACTIVE SEED AND SENTINEL LYMPH NODE BIOPSY Left 06/26/2017   Procedure: BREAST LUMPECTOMY WITH RADIOACTIVE SEED AND SENTINEL LYMPH NODE BIOPSY ERAS  PATHWAY;  Surgeon: [name], MD;  Location: [facility];  Service: General;  Laterality: Left;  pec block  . NASAL TURBINATE REDUCTION    . PORTACATH PLACEMENT N/A 06/26/2017   Procedure: INSERTION PORT-A-CATH;  Surgeon: [name], MD;  Location:  [facility];  Service: General;  Laterality: N/A;  . TRANSTHORACIC ECHOCARDIOGRAM  12/2015   [facility] May 2017: Mild concentric LVH. Global hypokinesis. GR daily. EF 18% severe LA dilation. Mitral annular dilatation with papillary muscle dysfunction and moderate MR. Dilated IVC consistent with elevated RAP.    SOCIAL HISTORY: Social History   Socioeconomic History  . Marital status: Widowed    Spouse name: Not on file  . Number of children: Not on file  . Years of education: Not on file  . Highest education level: Not on file  Social Needs  . Financial resource strain: Not on file  . Food insecurity - worry: Not on file  . Food insecurity - inability: Not on file  . Transportation needs - medical: Not on file  . Transportation needs - non-medical: Not on file  Occupational History  . Not on file  Tobacco Use  . Smoking status: Former Smoker    Packs/day: 0.25    Years: 23.00  Pack years: 5.75    Types: Cigarettes    Last attempt to quit: 10/09/2015    Years since quitting: 1.7  . Smokeless tobacco: Never Used  Substance and Sexual Activity  . Alcohol use: No  . Drug use: No  . Sexual activity: No  Other Topics Concern  . Not on file  Social History Narrative   She is a widowed mother of 2 (daughter and son).   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   She moved to [place] apparently back in 2016, but was visiting family in [place] and was diagnosed with Cardiomyopathy.   She is accompanied by her daughter. She walks roughly 1-2 miles a day.    FAMILY HISTORY: Family History  Problem Relation Age of Onset  . Colon cancer Mother 17       deceased 38  . Heart attack Father   . Breast cancer Other 66       2nd cousin on maternal side; currently 68  . Pancreatic cancer Other        2nd cousin once removed on maternal side; deceased 77s    ALLERGIES:  is allergic to latex; tylenol with codeine #3 [acetaminophen-codeine]; and  penicillins.  MEDICATIONS:  Current Outpatient Medications  Medication Sig Dispense Refill  . amLODipine (NORVASC) 5 MG tablet Take 1 tablet (5 mg total) by mouth daily. 30 tablet 5  . aspirin 81 MG chewable tablet Chew 81 mg by mouth daily.    . carvedilol (COREG) 12.5 MG tablet Take 1 tablet (12.5 mg total) by mouth 2 (two) times daily with a meal. 60 tablet 5  . furosemide (LASIX) 40 MG tablet Take 1 tablet (40 mg total) by mouth daily. (Patient taking differently: Take 40-80 mg by mouth daily. TAKE 2 TABLETS IF WEIGHT IS 240 LBS) 30 tablet 5  . naproxen (NAPROSYN) 500 MG tablet Take 500 mg daily as needed by mouth for mild pain.    Marland Kitchen oxyCODONE (OXY IR/ROXICODONE) 5 MG immediate release tablet Take 1-2 tablets (5-10 mg total) every 6 (six) hours as needed by mouth for moderate pain, severe pain or breakthrough pain. 20 tablet 0  . sacubitril-valsartan (ENTRESTO) 24-26 MG Take 1 tablet by mouth 2 (two) times daily. 60 tablet 5  . spironolactone (ALDACTONE) 25 MG tablet Take 1 tablet (25 mg total) by mouth daily. (Patient taking differently: Take 25 mg every evening by mouth. ) 30 tablet 5  . valACYclovir (VALTREX) 1000 MG tablet Take 1 tablet (1,000 mg total) by mouth daily. (Patient taking differently: Take 1,000 mg daily as needed by mouth (outbreaks). ) 7 tablet 6  . vitamin C (ASCORBIC ACID) 500 MG tablet Take 1,000 mg 2 (two) times daily by mouth.     No current facility-administered medications for this visit.      REVIEW OF SYSTEMS:   Constitutional: Denies fevers, chills or abnormal night sweats Eyes: Denies blurriness of vision, double vision or watery eyes Ears, nose, mouth, throat, and face: Denies mucositis or sore throat Respiratory: Denies cough, dyspnea or wheezes Cardiovascular: Denies palpitation, chest discomfort or lower extremity swelling Gastrointestinal:  Denies nausea, heartburn or change in bowel habits Skin: Denies abnormal skin rashes Lymphatics: Denies new  lymphadenopathy or easy bruising Neurological:Denies numbness, tingling or new weaknesses Behavioral/Psych: Mood is stable, no new changes  All other systems were reviewed with the patient and are negative Breast: (+) limited ROM of left arm, right chest soreness from port  PHYSICAL EXAMINATION: ECOG PERFORMANCE  STATUS: 1 - Symptomatic but completely ambulatory  Vitals:   07/10/17 1017  BP: 127/76  Pulse: 67  Resp: 18  Temp: 98 F (36.7 C)  SpO2: 97%   Filed Weights   07/10/17 1017  Weight: 243 lb 6.4 oz (110.4 kg)    GENERAL:alert, no distress and comfortable SKIN: skin color, texture, turgor are normal, no rashes or significant lesions EYES: normal, conjunctiva are pink and non-injected, sclera clear OROPHARYNX:no exudate, no erythema and lips, buccal mucosa, and tongue normal  NECK: supple, thyroid normal size, non-tender, without nodularity LYMPH:  no palpable lymphadenopathy in the cervical, axillary or inguinal LUNGS: clear to auscultation and percussion with normal breathing effort HEART: regular rate & rhythm and no murmurs and no lower extremity edema ABDOMEN:abdomen soft, non-tender and normal bowel sounds Musculoskeletal:no cyanosis of digits and no clubbing  PSYCH: alert & oriented x 3 with fluent speech NEURO: no focal motor/sensory deficits Breast: (+) s/p left lumpectomy: surgical incision in left breast and axilla healed well, no seroma or palpable mass. Right breast exam benign  LABORATORY DATA:  I have reviewed the data as listed CBC Latest Ref Rng & Units 06/24/2017 06/18/2017 03/20/2017  WBC 4.0 - 10.5 K/uL 7.1 6.6 7.5  Hemoglobin 12.0 - 15.0 g/dL 13.0 12.9 12.7  Hematocrit 36.0 - 46.0 % 38.8 38.4 37.9  Platelets 150 - 400 K/uL 305 292 362    CMP Latest Ref Rng & Units 06/24/2017 06/18/2017 03/20/2017  Glucose 65 - 99 mg/dL 132(H) 112 99  BUN 6 - 20 mg/dL 13 13.4 13  Creatinine 0.44 - 1.00 mg/dL 0.81 0.8 0.78  Sodium 135 - 145 mmol/L 137 142 141   Potassium 3.5 - 5.1 mmol/L 3.5 4.3 4.2  Chloride 101 - 111 mmol/L 108 - 108  CO2 22 - 32 mmol/L '23 23 22  ' Calcium 8.9 - 10.3 mg/dL 9.1 9.4 9.4  Total Protein 6.4 - 8.3 g/dL - 7.9 7.3  Total Bilirubin 0.20 - 1.20 mg/dL - 0.48 0.4  Alkaline Phos 40 - 150 U/L - 104 109  AST 5 - 34 U/L - 22 24  ALT 0 - 55 U/L - 30 40(H)     PATHOLOGY  Diagnosis 06/26/17  1. Breast, lumpectomy, Left - INVASIVE DUCTAL CARCINOMA, GRADE III/III, SPANNING 1.2 CM. - THE SURGICAL RESECTION MARGINS ARE NEGATIVE FOR CARCINOMA. - SEE ONCOLOGY TABLE BELOW. 2. Lymph node, sentinel, biopsy, Left axillary #1 - THERE IS NO EVIDENCE OF CARCINOMA IN 1 OF 1 LYMPH NODE (0/1). 3. Lymph node, sentinel, biopsy, Left axillary #2 - THERE IS NO EVIDENCE OF CARCINOMA IN 1 OF 1 LYMPH NODE (0/1). 4. Lymph node, sentinel, biopsy, Left axillary #3 - THERE IS NO EVIDENCE OF CARCINOMA IN 1 OF 1 LYMPH NODE (0/1). Microscopic Comment 1. BREAST, INVASIVE TUMOR Procedure: Seed localized lumpectomy. Laterality: Left. Tumor Size: 1.2 cm (gross measurement). Histologic Type: Ductal. Grade: III. Tubular Differentiation: 3. Nuclear Pleomorphism: 2. Mitotic Count: 3. Ductal Carcinoma in Situ (DCIS): Not identified. Extent of Tumor: Confined to breast parenchyma. Margins: Greater than 0.2 cm to all margins. Regional Lymph Nodes: Number of Lymph Nodes Examined: 3. Number of Sentinel Lymph Nodes Examined: 3. Lymph Nodes with Macrometastases: 0. Lymph Nodes with Micrometastases: 0. Lymph Nodes with Isolated Tumor Cells: 0. Breast Prognostic Profile: Case [id number]. Estrogen Receptor: 0%. 1 of 3 FINAL for [place], Belita ANN [id number]) Microscopic Comment(continued) Progesterone Receptor: 0%. Her2: No amplification was detected. Ki-67: 70%. Best tumor block for sendout testing: 1A or 1B. Pathologic Stage  Classification (pTNM, AJCC 8th Edition): Primary Tumor (pT): pT1c. Regional Lymph Nodes (pN): pN0. Distant  Metastases (pM): pMX.   Diagnosis 1. Breast, left, needle core biopsy, 5:30 o'clock - INVASIVE DUCTAL CARCINOMA - SEE COMMENT 2. Lymph node, needle/core biopsy, left axillary - NO CARCINOMA IDENTIFIED IN ONE LYMPH NODE (0/1) - SEE COMMENT Microscopic Comment 1. Based on the biopsy, the carcinoma appears Nottingham grade 3 of 3 and measures 1.2 cm in greatest linear extent. Prognostic markers (ER/PR/ki-67/HER2-FISH) are pending and will be reported in an addendum. Dr. Lyndon Code has reviewed the case and agrees with above diagnosis. These results were called to [facility] on June 10, 2017. 2. Deeper levels were examined. Cytokeratin AE1/3 is negative supporting the above diagnosis. 1. PROGNOSTIC INDICATORS Results: IMMUNOHISTOCHEMICAL AND MORPHOMETRIC ANALYSIS PERFORMED MANUALLY Estrogen Receptor: 0%, NEGATIVE Progesterone Receptor: 0%, NEGATIVE Proliferation Marker Ki67: 70%   RADIOGRAPHIC STUDIES: I have personally reviewed the radiological images as listed and agreed with the findings in the report. Nm Sentinel Node Inj-no Rpt (breast)  Result Date: 06/26/2017 Sulfur colloid was injected by the nuclear medicine technologist for melanoma sentinel node.   Mm Breast Surgical Specimen  Result Date: 06/26/2017 CLINICAL DATA:  Left lumpectomy for invasive ductal carcinoma. EXAM: SPECIMEN RADIOGRAPH OF THE LEFT BREAST COMPARISON:  Previous exam(s). FINDINGS: Status post excision of the left breast. The radioactive seed, ribbon shaped biopsy marker clip and mass are present, completely intact, and were marked for pathology. IMPRESSION: Specimen radiograph of the left breast. Electronically Signed   By: Claudie Revering M.D.   On: 06/26/2017 10:54   Dg Chest Port 1 View  Result Date: 06/26/2017 CLINICAL DATA:  The patient has undergone right-sided Port-A-Cath placement. EXAM: PORTABLE CHEST 1 VIEW COMPARISON:  Chest x-ray of October 09, 2015 and CT scan chest of May 19, 2017. FINDINGS: The cardiac silhouette remains enlarged. The pulmonary vascularity is engorged. The lungs are adequately inflated. There is no pleural effusion or pneumothorax. The porta catheter tip projects over the proximal SVC. There are surgical clips and a small amount of gas in the left axillary soft tissues. IMPRESSION: CHF with mild pulmonary vascular congestion and interstitial edema. No postprocedure complication following Port-A-Cath appliance placement on the right. Electronically Signed   By: David  Martinique M.D.   On: 06/26/2017 12:57   Dg Fluoro Guide Cv Line-no Report  Result Date: 06/26/2017 Fluoroscopy was utilized by the requesting physician.  No radiographic interpretation.   Mm Lt Radioactive Seed Loc Mammo Guide  Result Date: 06/24/2017 CLINICAL DATA:  61 year old female for radioactive seed localization of left breast cancer. EXAM: MAMMOGRAPHIC GUIDED RADIOACTIVE SEED LOCALIZATION OF THE LEFT BREAST COMPARISON:  Previous exam(s). FINDINGS: Patient presents for radioactive seed localization prior to left lumpectomy. I met with the patient and we discussed the procedure of seed localization including benefits and alternatives. We discussed the high likelihood of a successful procedure. We discussed the risks of the procedure including infection, bleeding, tissue injury and further surgery. We discussed the low dose of radioactivity involved in the procedure. Informed, written consent was given. The usual time-out protocol was performed immediately prior to the procedure. Using mammographic guidance, sterile technique, 1% lidocaine and an I-125 radioactive seed, the ribbon shaped clip was localized using a lateral approach. The follow-up mammogram images confirm the seed in the expected location and were marked for Dr. Barry Dienes. Follow-up survey of the patient confirms presence of the radioactive seed. Order number of I-125 seed:  [id number]. Total activity:  0.258 millicuries  Reference  Date: 05/29/2017 The patient tolerated the procedure well and was released from the [facility]. She was given instructions regarding seed removal. IMPRESSION: Radioactive seed localization left breast. No apparent complications. Electronically Signed   By: Margarette Canada M.D.   On: 06/24/2017 11:19    ASSESSMENT & PLAN:  Kristin Ward is a 61 y.o. female with a history of HTN, COPD, retinitis pigmentosa, and CHF with EF 45-50%, presented with screening discovered left breast cancer.   1. 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  I reviewed her surgical pathology findings in great detail, surgical margins were negative.  3 sentinel lymph nodes were negative. -We previously reviewed the risk of cancer recurrence after complete surgical resection, which is based on the stage and biology of cancer. She has early stage but very aggressive disease.  She has high risk for recurrence due to triple negative disease  -Due to the high risk disease, I recommend adjuvant chemotherapy. We discussed adjuvant chemotherapy regimens, due to her cardiomyopathy, she is not a candidate for Adriamycin.  I suggest moderate intense chemo regiment docetaxel and Cytoxan every 2 weeks, for 4 cycles, due to her comorbidity. --Chemotherapy consent: Side effects including but does not not limited to, fatigue, nausea, vomiting, diarrhea, hair loss, neuropathy, fluid retention, renal and kidney dysfunction, neutropenic fever, needed for blood transfusion, bleeding, were discussed with patient in great detail. She agrees to proceed. -I addressed her concern with chemotherapy and discussed what benefits this will give to reduce her risk of recurrence. After a lengthy discussion she agreed to chemotherapy  -The goal of therapy is curative -Plan to start CT every 3 weeks for 4  cycles on 07/25/17. I reviewed side effects again. She will take Dexa 1 day before and 3 days after chemotherapy. I discussed antiemetics and EMLA cream use.  -we reviewed Neulasta injection and potential bone pain, he knows to use Claritin for 5 days after injection to prevent bone pain. -I also discussed the clinical trial UPBEAT, to monitor her heart by cardiac MRI before and after chemotherapy.  She is interested, I will let research nurse reach out to her.  -She will proceed with colonoscopy on 07/22/17. -f/u on 07/25/17    2. Genetics -Due to her age and triple negative breast cancer, and positive family history, she will be referred to genetics -Results were negative for any gene mutations.   3.  Chronic combined systolic and diastolic heart failure, EF 45-50% -Follow-up with her cardiologist Dr. Ellyn Hack  -repeat ECHO on 05/14/17 shows improved heart function  -She is on lasix and spironolactone, I may hold if she becomes dehydrated. She can continue aspirin.   4. HTN, COPD, retinitis pigmentosa with limited vision -Follow-up with primary care physician -She is legally blind  PLAN:  -Pt had chemo class today -Prescribed EMLA Cream, Dexamethasone, Zofran and Compazine today  -Lab, flush, f/u with me or Lacie and chemo TC on 12/14, 1/7, 1/28  -Lab, flush and f/u with Lacie on 12/21      Orders Placed This Encounter  Procedures  . CBC with Differential    Standing Status:   Standing    Number of Occurrences:   100    Standing Expiration Date:   07/09/2022  . Comprehensive metabolic panel    Standing Status:   Standing    Number of  Occurrences:   100    Standing Expiration Date:   07/09/2022    All questions were answered. The patient knows to call the clinic with any problems, questions or concerns.  I spent 25 minutes counseling the patient face to face. The total time spent in the appointment was 35 minutes and more than 50% was on counseling.     [name],  MD 07/10/2017 6:05 PM   This document serves as a record of services personally performed by [name], MD. It was created on her behalf by Joslyn Devon, a trained medical scribe. The creation of this record is based on the scribe's personal observations and the provider's statements to them.    I have reviewed the above documentation for accuracy and completeness, and I agree with the above.

## 2017-07-10 ENCOUNTER — Telehealth: Payer: Self-pay | Admitting: Hematology

## 2017-07-10 ENCOUNTER — Encounter: Payer: Self-pay | Admitting: *Deleted

## 2017-07-10 ENCOUNTER — Ambulatory Visit (HOSPITAL_BASED_OUTPATIENT_CLINIC_OR_DEPARTMENT_OTHER): Payer: Medicare HMO | Admitting: Hematology

## 2017-07-10 ENCOUNTER — Other Ambulatory Visit: Payer: Medicare HMO

## 2017-07-10 ENCOUNTER — Encounter: Payer: Self-pay | Admitting: Hematology

## 2017-07-10 VITALS — BP 127/76 | HR 67 | Temp 98.0°F | Resp 18 | Ht 67.25 in | Wt 243.4 lb

## 2017-07-10 DIAGNOSIS — I10 Essential (primary) hypertension: Secondary | ICD-10-CM

## 2017-07-10 DIAGNOSIS — Z7982 Long term (current) use of aspirin: Secondary | ICD-10-CM | POA: Diagnosis not present

## 2017-07-10 DIAGNOSIS — Z171 Estrogen receptor negative status [ER-]: Secondary | ICD-10-CM | POA: Diagnosis not present

## 2017-07-10 DIAGNOSIS — I5042 Chronic combined systolic (congestive) and diastolic (congestive) heart failure: Secondary | ICD-10-CM

## 2017-07-10 DIAGNOSIS — C50512 Malignant neoplasm of lower-outer quadrant of left female breast: Secondary | ICD-10-CM | POA: Diagnosis not present

## 2017-07-10 MED ORDER — LIDOCAINE-PRILOCAINE 2.5-2.5 % EX CREA: TOPICAL_CREAM | CUTANEOUS | 3 refills | Status: DC

## 2017-07-10 MED ORDER — PROCHLORPERAZINE MALEATE 10 MG PO TABS: 10.0000 mg | ORAL_TABLET | Freq: Four times a day (QID) | ORAL | 1 refills | Status: DC | PRN

## 2017-07-10 MED ORDER — DEXAMETHASONE 4 MG PO TABS: 8.0000 mg | ORAL_TABLET | Freq: Two times a day (BID) | ORAL | 1 refills | Status: DC

## 2017-07-10 MED ORDER — ONDANSETRON HCL 8 MG PO TABS: 8.0000 mg | ORAL_TABLET | Freq: Two times a day (BID) | ORAL | 1 refills | Status: DC | PRN

## 2017-07-10 NOTE — Telephone Encounter (Signed)
Scheduled appt per 11/29 los - Gave patient aVS and calender per los.  

## 2017-07-10 NOTE — Progress Notes (Signed)
START ON PATHWAY REGIMEN - Breast     A cycle is every 21 days:     Docetaxel      Cyclophosphamide   **Always confirm dose/schedule in your pharmacy ordering system**    Patient Characteristics: Postoperative without Neoadjuvant Therapy (Pathologic Staging), Invasive Disease, Adjuvant Therapy, HER2 Negative/Unknown/Equivocal, ER Negative/Unknown, Node Negative, pT1a-c, pN0/N44m or pT2 or Higher, pN0 Therapeutic Status: Postoperative without Neoadjuvant Therapy (Pathologic Staging) AJCC Grade: G3 AJCC N Category: pN0 AJCC M Category: cM0 ER Status: Negative (-) AJCC 8 Stage Grouping: IB HER2 Status: Negative (-) Oncotype Dx Recurrence Score: Not Appropriate AJCC T Category: pT1c PR Status: Negative (-) Intent of Therapy: Curative Intent, Discussed with Patient

## 2017-07-11 ENCOUNTER — Encounter: Payer: Self-pay | Admitting: Hematology

## 2017-07-11 ENCOUNTER — Encounter: Payer: Self-pay | Admitting: *Deleted

## 2017-07-11 NOTE — Progress Notes (Signed)
Submitted auth request for Ondansetron today.  Status is pending.  °

## 2017-07-11 NOTE — Progress Notes (Signed)
Pt's Lidocaine-Prilocaine was approved from 08/10/16 - 10/09/17.

## 2017-07-11 NOTE — Progress Notes (Signed)
Submitted auth request for Lidocaine/Prilocaine today.  Status is pending.  °

## 2017-07-15 ENCOUNTER — Telehealth: Payer: Self-pay | Admitting: *Deleted

## 2017-07-15 ENCOUNTER — Encounter (HOSPITAL_COMMUNITY): Payer: Self-pay | Admitting: *Deleted

## 2017-07-15 ENCOUNTER — Other Ambulatory Visit: Payer: Self-pay

## 2017-07-15 ENCOUNTER — Encounter: Payer: Self-pay | Admitting: Hematology

## 2017-07-15 NOTE — Telephone Encounter (Signed)
Received vm call from Nicole/[employer] asking for return call for clarification on zofran.  Returned call & gave info that pt needs script post chemo for refractory nausea/vomiting at home.  Was told that this should be covered.

## 2017-07-15 NOTE — Progress Notes (Signed)
Pt's Ondansetron was denied.  Gave denial to the nurse.

## 2017-07-16 ENCOUNTER — Encounter: Payer: Self-pay | Admitting: Hematology

## 2017-07-16 ENCOUNTER — Telehealth: Payer: Self-pay | Admitting: *Deleted

## 2017-07-16 NOTE — Progress Notes (Signed)
Called patient to introduce myself as her Arboriculturist. Asked patient if she had any questions or concerns regarding financial assistance. Patient states she is not able to pay the copays she is being billed for and not sure how much it will all cost. Advised patient to communicate with check in when copay is being requested and not to hesitate contacting the number on the bill to set up payment arrangements. Patient verbalized understanding. Advised patient there are currently no foundations with funds available for her diagnosis but should something come available, I will reach out to her and apply on her behalf. She verbalized understanding.  Discussed Advertising account executive with patient. Patient gave me her verbal income and according to this she qualifies for the grant. Asked patient if she could bring proof of her income at her visit on 07/25/17 to complete the application and get the grant going. She states she can. Advised her to bring bills that she would like to use the grant for if he has any at that time. Patient appreciated my call and I gave her my name and number for any additional financial questions or concerns.

## 2017-07-16 NOTE — Telephone Encounter (Signed)
[facility] per pt request for bedside commode. Left voice mail message with order for this per Dr. Burr Medico.

## 2017-07-22 ENCOUNTER — Ambulatory Visit (HOSPITAL_COMMUNITY): Admission: RE | Admit: 2017-07-22 | Payer: Medicare HMO | Source: Ambulatory Visit | Admitting: Gastroenterology

## 2017-07-22 HISTORY — DX: Headache, unspecified: R51.9

## 2017-07-22 HISTORY — DX: Headache: R51

## 2017-07-22 SURGERY — COLONOSCOPY WITH PROPOFOL
Anesthesia: Monitor Anesthesia Care

## 2017-07-24 ENCOUNTER — Encounter: Payer: Medicare HMO | Admitting: Physical Therapy

## 2017-07-24 NOTE — Progress Notes (Signed)
[place]  [phone] [phone]  Clinic Follow up Note   Patient Care Team: Girtha Rm, NP-C as PCP - General (Family Medicine) [name], MD as Consulting Physician (Hematology) [name], MD as Consulting Physician (General Surgery)   Date of Service:  07/25/2017  CHIEF COMPLAINTS:  Malignant neoplasm of lower-outer quadrant of left breast of female, triple negative    Oncology History   Cancer Staging Malignant neoplasm of lower-outer quadrant of left breast of female, estrogen receptor negative ([place]) Staging form: Breast, AJCC 8th Edition - Clinical stage from 06/06/2017: Stage IB (cT1c, cN0, cM0, G3, ER: Negative, PR: Negative, HER2: Negative) - Signed by [name], MD on 06/15/2017 - Pathologic stage from 06/26/2017: Stage IB (pT1c, pN0, cM0, G3, ER: Negative, PR: Negative, HER2: Negative) - Signed by [name], MD on 07/10/2017       Malignant neoplasm of lower-outer quadrant of left breast of female, estrogen receptor negative ([place])   05/30/2017 Mammogram    Diagnostic mammo and US IMPRESSION: 1. Highly suspicious 1.4 cm mass in the slightly lower slightly outer left breast -tissue sampling recommended. 2. Indeterminate 0.5 mm mass in the slightly lower slightly outer left breast -tissue sampling recommended. 3. At least 2 left axillary lymph nodes with borderline cortical thickness.      06/06/2017 Initial Biopsy    Diagnosis 1. Breast, left, needle core biopsy, 5:30 o'clock - INVASIVE DUCTAL CARCINOMA, G3 2. Lymph node, needle/core biopsy, left axillary - NO CARCINOMA IDENTIFIED IN ONE LYMPH NODE (0/1)      06/06/2017 Initial Diagnosis    Malignant neoplasm of lower-outer quadrant of left breast of female, estrogen receptor negative ([place])      06/06/2017 Receptors her2    Estrogen Receptor: 0%, NEGATIVE Progesterone Receptor: 0%, NEGATIVE Proliferation Marker Ki67: 70%      06/26/2017 Surgery    LEFT  BREAST LUMPECTOMY WITH RADIOACTIVE SEED AND SENTINEL LYMPH NODE BIOPSY ERAS  PATHWAY AND INSERTION PORT-A-CATH By Dr. Barry Dienes on 06/26/17       06/26/2017 Pathology Results    Diagnosis 06/26/17  1. Breast, lumpectomy, Left - INVASIVE DUCTAL CARCINOMA, GRADE III/III, SPANNING 1.2 CM. - THE SURGICAL RESECTION MARGINS ARE NEGATIVE FOR CARCINOMA. - SEE ONCOLOGY TABLE BELOW. 2. Lymph node, sentinel, biopsy, Left axillary #1 - THERE IS NO EVIDENCE OF CARCINOMA IN 1 OF 1 LYMPH NODE (0/1). 3. Lymph node, sentinel, biopsy, Left axillary #2 - THERE IS NO EVIDENCE OF CARCINOMA IN 1 OF 1 LYMPH NODE (0/1). 4. Lymph node, sentinel, biopsy, Left axillary #3 - THERE IS NO EVIDENCE OF CARCINOMA IN 1 OF 1 LYMPH NODE (0/1).       07/25/2017 -  Chemotherapy    CT every 3 weekd for 4 cycles plan to start on 07/25/17         HISTORY OF PRESENTING ILLNESS: 06/18/17 [name] 61 y.o. female is here because of newly diagnosed left breast cancer. She presents to Breast Clinic today accompanied by her daughter.   She found this mass by screening mammogram.  She denies palpable breast mass before biopsy.  She thinks her current palpable mass is a keloid post biopsy.  She denies any other new symptoms.  No change of her energy level, weight, or appetite.  In the past she had HTN, possible COPD, and her cousin had breast cancer and her aunt had colon cancer. She quit smoking after 23 years She smoked a pack a week. She is can see moderately  and has had cornea surgery. She walks with a cane. She had retinitis pigmentosa She has medication for this. She has CHF and sees a cardiologist and is on medication, Norvasc and coreg. She had lung nodules last year and she had it monitored.    GYN HISTORY  Menarchal: 11 LMP: 2015 Contraceptive: yes HRT: No G2P2: one son and one daughter    CURRENT THERAPY: adjuvant chemo TC every 3 weeks for 4 cycles starting 07/25/17   INTERVAL HISTORY:  Kristin Messerschmidt  Ward is here for a follow up and first cycle TC. She presents to the clinic today accompanied by her daughter. She notes one of her medications was denied originally from her insurance because it was not FDA approved. She notes she took her dexa yesterday. She did pick up her EMLA cream and Zofran and compazine. She feels like she understood the chemo class. She notes after the snow she developed allergies and has been sneezing. She is wearing a mask to day to help contain it. She requested an order for a bedside commode at [facility] or [facility].     MEDICAL HISTORY:  Past Medical History:  Diagnosis Date  . Abnormal myocardial perfusion study 03/15/2016   EF 15%, severe LV systolic dysfcn, global hypokinesis and inferior akinesis. dilated LV  . Abnormal x-ray of lungs with single pulmonary nodule 03/15/2016   per records from [place]   . Blind   . Cholelithiasis 05/19/2017   On CT  . COPD (chronic obstructive pulmonary disease) ([place])    PT DENIES  . Coronary artery calcification seen on CAT scan 05/19/2017  . Dilated idiopathic cardiomyopathy ([place]) 12/2015   EF 15-20%. Diagnosed in [facility]  . Family history of colon cancer   . Genetic testing 06/19/2017   Multi-Cancer panel (83 genes) @ [facility] - No pathogenic mutations detected  . Headache    NONE RECENT  . Hypertension   . Malignant neoplasm of lower-outer quadrant of left breast of female, estrogen receptor negative ([place]) 06/11/2017  . Retinitis pigmentosa   . Solitary pulmonary nodule on lung CT 03/16/2016   per records from [place]  . Uveitis     SURGICAL HISTORY: Past Surgical History:  Procedure Laterality Date  . brain cyst removed  2007   to help with headaches per patient , aspirated   . BREAST LUMPECTOMY WITH RADIOACTIVE SEED AND SENTINEL LYMPH NODE BIOPSY Left 06/26/2017   Procedure: BREAST LUMPECTOMY WITH RADIOACTIVE SEED AND SENTINEL LYMPH NODE BIOPSY ERAS  PATHWAY;  Surgeon: [name], MD;   Location: [facility];  Service: General;  Laterality: Left;  pec block  . NASAL TURBINATE REDUCTION    . PORTACATH PLACEMENT N/A 06/26/2017   Procedure: INSERTION PORT-A-CATH;  Surgeon: [name], MD;  Location: [facility];  Service: General;  Laterality: N/A;  . TRANSTHORACIC ECHOCARDIOGRAM  12/2015   [facility] May 2017: Mild concentric LVH. Global hypokinesis. GR daily. EF 18% severe LA dilation. Mitral annular dilatation with papillary muscle dysfunction and moderate MR. Dilated IVC consistent with elevated RAP.    SOCIAL HISTORY: Social History   Socioeconomic History  . Marital status: Widowed    Spouse name: Not on file  . Number of children: Not on file  . Years of education: Not on file  . Highest education level: Not on file  Social Needs  . Financial resource strain: Not on file  . Food insecurity - worry: Not on file  . Food insecurity - inability: Not on file  .  Transportation needs - medical: Not on file  . Transportation needs - non-medical: Not on file  Occupational History  . Not on file  Tobacco Use  . Smoking status: Former Smoker    Packs/day: 0.25    Years: 23.00    Pack years: 5.75    Types: Cigarettes    Last attempt to quit: 10/09/2015    Years since quitting: 1.7  . Smokeless tobacco: Never Used  Substance and Sexual Activity  . Alcohol use: No  . Drug use: No  . Sexual activity: No  Other Topics Concern  . Not on file  Social History Narrative   She is a widowed mother of 2 (daughter and son).   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   She moved to [place] apparently back in 2016, but was visiting family in [place] and was diagnosed with Cardiomyopathy.   She is accompanied by her daughter. She walks roughly 1-2 miles a day.    FAMILY HISTORY: Family History  Problem Relation Age of Onset  . Colon cancer Mother 69       deceased 54  . Heart attack Father   . Breast cancer Other 98       2nd cousin on maternal side; currently  28  . Pancreatic cancer Other        2nd cousin once removed on maternal side; deceased 60s    ALLERGIES:  is allergic to latex; tylenol with codeine #3 [acetaminophen-codeine]; and penicillins.  MEDICATIONS:  Current Outpatient Medications  Medication Sig Dispense Refill  . amLODipine (NORVASC) 5 MG tablet Take 1 tablet (5 mg total) by mouth daily. 30 tablet 5  . aspirin 81 MG chewable tablet Chew 81 mg by mouth daily.    . carvedilol (COREG) 12.5 MG tablet Take 1 tablet (12.5 mg total) by mouth 2 (two) times daily with a meal. 60 tablet 5  . dexamethasone (DECADRON) 4 MG tablet Take 2 tablets (8 mg total) by mouth 2 (two) times daily. Start the day before Taxotere. Then again the day after chemo for 3 days. 30 tablet 1  . furosemide (LASIX) 40 MG tablet Take 1 tablet (40 mg total) by mouth daily. (Patient taking differently: Take 40-80 mg by mouth daily. TAKE 2 TABLETS IF WEIGHT IS 240 LBS) 30 tablet 5  . lidocaine-prilocaine (EMLA) cream Apply to affected area once 30 g 3  . naproxen (NAPROSYN) 500 MG tablet Take 500 mg daily as needed by mouth for mild pain.    . sacubitril-valsartan (ENTRESTO) 24-26 MG Take 1 tablet by mouth 2 (two) times daily. 60 tablet 5  . spironolactone (ALDACTONE) 25 MG tablet Take 1 tablet (25 mg total) by mouth daily. (Patient taking differently: Take 25 mg every evening by mouth. ) 30 tablet 5  . vitamin C (ASCORBIC ACID) 500 MG tablet Take 1,000 mg 2 (two) times daily by mouth.    . ondansetron (ZOFRAN) 8 MG tablet Take 1 tablet (8 mg total) by mouth 2 (two) times daily as needed for refractory nausea / vomiting. Start on day 3 after chemo. (Patient not taking: Reported on 07/25/2017) 30 tablet 1  . oxyCODONE (OXY IR/ROXICODONE) 5 MG immediate release tablet Take 1-2 tablets (5-10 mg total) every 6 (six) hours as needed by mouth for moderate pain, severe pain or breakthrough pain. (Patient not taking: Reported on 07/25/2017) 20 tablet 0  . prochlorperazine  (COMPAZINE) 10 MG tablet Take 1 tablet (10 mg total) by mouth every 6 (  six) hours as needed (Nausea or vomiting). (Patient not taking: Reported on 07/25/2017) 30 tablet 1  . valACYclovir (VALTREX) 1000 MG tablet Take 1 tablet (1,000 mg total) by mouth daily. (Patient not taking: Reported on 07/25/2017) 7 tablet 6   No current facility-administered medications for this visit.    Facility-Administered Medications Ordered in Other Visits  Medication Dose Route Frequency Provider Last Rate Last Dose  . sodium chloride flush (NS) 0.9 % injection 10 mL  10 mL Intracatheter PRN [name], MD   10 mL at 07/25/17 1503     REVIEW OF SYSTEMS:   Constitutional: Denies fevers, chills or abnormal night sweats Eyes: Denies blurriness of vision, double vision or watery eyes Ears, nose, mouth, throat, and face: Denies mucositis or sore throat (+) seasonal allergy symptoms, sneezing Respiratory: Denies cough, dyspnea or wheezes Cardiovascular: Denies palpitation, chest discomfort or lower extremity swelling Gastrointestinal:  Denies nausea, heartburn or change in bowel habits Skin: Denies abnormal skin rashes Lymphatics: Denies new lymphadenopathy or easy bruising Neurological:Denies numbness, tingling or new weaknesses Behavioral/Psych: Mood is stable, no new changes  All other systems were reviewed with the patient and are negative Breast: (+) limited ROM of left arm, right chest soreness from port  PHYSICAL EXAMINATION: ECOG PERFORMANCE STATUS: 1 - Symptomatic but completely ambulatory  Vitals:   07/25/17 0918  BP: (!) 142/87  Pulse: 83  Resp: 20  Temp: 98.2 F (36.8 C)  SpO2: 99%   Filed Weights   07/25/17 0918  Weight: 239 lb 6.4 oz (108.6 kg)    GENERAL:alert, no distress and comfortable SKIN: skin color, texture, turgor are normal, no rashes or significant lesions EYES: normal, conjunctiva are pink and non-injected, sclera clear OROPHARYNX:no exudate, no erythema and lips, buccal  mucosa, and tongue normal  NECK: supple, thyroid normal size, non-tender, without nodularity LYMPH:  no palpable lymphadenopathy in the cervical, axillary or inguinal LUNGS: clear to auscultation and percussion with normal breathing effort HEART: regular rate & rhythm and no murmurs and no lower extremity edema ABDOMEN:abdomen soft, non-tender and normal bowel sounds Musculoskeletal:no cyanosis of digits and no clubbing  PSYCH: alert & oriented x 3 with fluent speech NEURO: no focal motor/sensory deficits Breast: (+) s/p left lumpectomy: surgical incision in left breast and axilla healed well, no seroma or palpable mass. Right breast exam benign  LABORATORY DATA:  I have reviewed the data as listed CBC Latest Ref Rng & Units 07/25/2017 06/24/2017 06/18/2017  WBC 3.9 - 10.3 10e3/uL 14.0(H) 7.1 6.6  Hemoglobin 11.6 - 15.9 g/dL 12.4 13.0 12.9  Hematocrit 34.8 - 46.6 % 37.5 38.8 38.4  Platelets 145 - 400 10e3/uL 324 305 292    CMP Latest Ref Rng & Units 07/25/2017 06/24/2017 06/18/2017  Glucose 70 - 140 mg/dl 161(H) 132(H) 112  BUN 7.0 - 26.0 mg/dL 25.7 13 13.4  Creatinine 0.6 - 1.1 mg/dL 0.9 0.81 0.8  Sodium 136 - 145 mEq/L 140 137 142  Potassium 3.5 - 5.1 mEq/L 4.3 3.5 4.3  Chloride 101 - 111 mmol/L - 108 -  CO2 22 - 29 mEq/L 20(L) 23 23  Calcium 8.4 - 10.4 mg/dL 10.2 9.1 9.4  Total Protein 6.4 - 8.3 g/dL 8.2 - 7.9  Total Bilirubin 0.20 - 1.20 mg/dL 0.42 - 0.48  Alkaline Phos 40 - 150 U/L 106 - 104  AST 5 - 34 U/L 15 - 22  ALT 0 - 55 U/L 15 - 30     PATHOLOGY  Diagnosis 06/26/17  1. Breast,  lumpectomy, Left - INVASIVE DUCTAL CARCINOMA, GRADE III/III, SPANNING 1.2 CM. - THE SURGICAL RESECTION MARGINS ARE NEGATIVE FOR CARCINOMA. - SEE ONCOLOGY TABLE BELOW. 2. Lymph node, sentinel, biopsy, Left axillary #1 - THERE IS NO EVIDENCE OF CARCINOMA IN 1 OF 1 LYMPH NODE (0/1). 3. Lymph node, sentinel, biopsy, Left axillary #2 - THERE IS NO EVIDENCE OF CARCINOMA IN 1 OF 1 LYMPH NODE  (0/1). 4. Lymph node, sentinel, biopsy, Left axillary #3 - THERE IS NO EVIDENCE OF CARCINOMA IN 1 OF 1 LYMPH NODE (0/1). Microscopic Comment 1. BREAST, INVASIVE TUMOR Procedure: Seed localized lumpectomy. Laterality: Left. Tumor Size: 1.2 cm (gross measurement). Histologic Type: Ductal. Grade: III. Tubular Differentiation: 3. Nuclear Pleomorphism: 2. Mitotic Count: 3. Ductal Carcinoma in Situ (DCIS): Not identified. Extent of Tumor: Confined to breast parenchyma. Margins: Greater than 0.2 cm to all margins. Regional Lymph Nodes: Number of Lymph Nodes Examined: 3. Number of Sentinel Lymph Nodes Examined: 3. Lymph Nodes with Macrometastases: 0. Lymph Nodes with Micrometastases: 0. Lymph Nodes with Isolated Tumor Cells: 0. Breast Prognostic Profile: Case [phone]. Estrogen Receptor: 0%. 1 of 3 FINAL for Dorrough, Tyyonna ANN [phone]) Microscopic Comment(continued) Progesterone Receptor: 0%. Her2: No amplification was detected. Ki-67: 70%. Best tumor block for sendout testing: 1A or 1B. Pathologic Stage Classification (pTNM, AJCC 8th Edition): Primary Tumor (pT): pT1c. Regional Lymph Nodes (pN): pN0. Distant Metastases (pM): pMX.   Diagnosis 1. Breast, left, needle core biopsy, 5:30 o'clock - INVASIVE DUCTAL CARCINOMA - SEE COMMENT 2. Lymph node, needle/core biopsy, left axillary - NO CARCINOMA IDENTIFIED IN ONE LYMPH NODE (0/1) - SEE COMMENT Microscopic Comment 1. Based on the biopsy, the carcinoma appears Nottingham grade 3 of 3 and measures 1.2 cm in greatest linear extent. Prognostic markers (ER/PR/ki-67/HER2-FISH) are pending and will be reported in an addendum. Dr. Lyndon Code has reviewed the case and agrees with above diagnosis. These results were called to [facility] on June 10, 2017. 2. Deeper levels were examined. Cytokeratin AE1/3 is negative supporting the above diagnosis. 1. PROGNOSTIC INDICATORS Results: IMMUNOHISTOCHEMICAL AND  MORPHOMETRIC ANALYSIS PERFORMED MANUALLY Estrogen Receptor: 0%, NEGATIVE Progesterone Receptor: 0%, NEGATIVE Proliferation Marker Ki67: 70%   RADIOGRAPHIC STUDIES: I have personally reviewed the radiological images as listed and agreed with the findings in the report. Nm Sentinel Node Inj-no Rpt (breast)  Result Date: 06/26/2017 Sulfur colloid was injected by the nuclear medicine technologist for melanoma sentinel node.   Mm Breast Surgical Specimen  Result Date: 06/26/2017 CLINICAL DATA:  Left lumpectomy for invasive ductal carcinoma. EXAM: SPECIMEN RADIOGRAPH OF THE LEFT BREAST COMPARISON:  Previous exam(s). FINDINGS: Status post excision of the left breast. The radioactive seed, ribbon shaped biopsy marker clip and mass are present, completely intact, and were marked for pathology. IMPRESSION: Specimen radiograph of the left breast. Electronically Signed   By: Claudie Revering M.D.   On: 06/26/2017 10:54   Dg Chest Port 1 View  Result Date: 06/26/2017 CLINICAL DATA:  The patient has undergone right-sided Port-A-Cath placement. EXAM: PORTABLE CHEST 1 VIEW COMPARISON:  Chest x-ray of October 09, 2015 and CT scan chest of May 19, 2017. FINDINGS: The cardiac silhouette remains enlarged. The pulmonary vascularity is engorged. The lungs are adequately inflated. There is no pleural effusion or pneumothorax. The porta catheter tip projects over the proximal SVC. There are surgical clips and a small amount of gas in the left axillary soft tissues. IMPRESSION: CHF with mild pulmonary vascular congestion and interstitial edema. No postprocedure complication following Port-A-Cath appliance placement on  the right. Electronically Signed   By: David  Martinique M.D.   On: 06/26/2017 12:57   Dg Fluoro Guide Cv Line-no Report  Result Date: 06/26/2017 Fluoroscopy was utilized by the requesting physician.  No radiographic interpretation.    ASSESSMENT & PLAN:  Kristin Ward is a 61 y.o. female with a  history of HTN, COPD, retinitis pigmentosa, and CHF with EF 45-50%, presented with screening discovered left breast cancer.   1. 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  I reviewed her surgical pathology findings in great detail, surgical margins were negative.  3 sentinel lymph nodes were negative. -We previously reviewed the risk of cancer recurrence after complete surgical resection, which is based on the stage and biology of cancer. She has early stage but very aggressive disease.  She has high risk for recurrence due to triple negative disease  -Due to the high risk disease, I recommend adjuvant chemotherapy. We discussed adjuvant chemotherapy regimens, due to her cardiomyopathy, she is not a candidate for Adriamycin.  I suggest moderate intense chemo regiment docetaxel and Cytoxan every 2 weeks, for 4 cycles, due to her comorbidity.  Chemo consent obtained.  -I also discussed the clinical trial UPBEAT, to monitor her heart by cardiac MRI before and after chemotherapy. She is interested, unfortunately she was not eligible due to her heart failure. -She will take Dexa 1 day before and 3 days after chemotherapy. I discussed antiemetics and EMLA cream use.  -Labs reviewed, her ANC and WBC are mildly elevated but adequate to proceed with treatment.  -She will start CT every 3 weeks for 4 cycles on 07/25/17. I reviewed risk, benefits and side effects again, and I answered her many questions. -We again discussed Neulasta Onpro and potential bone pain, I advised her to use Claritin for 5 days after day 1 to prevent bone pain. She is fine to use naproxen for the pain as well. -I reviewed her medication, I recommend her to hold her Lasix and amlodipine for now, due to her low normal blood pressure, she will continue Coreg and spironolactone -I encouraged  her to contact our clinic if she develops hypotension, dizziness, fever of 100.78F or higher, or any significant or unexpected side effects. -f/u on 12/21   2. Genetics -Due to her age and triple negative breast cancer, and positive family history, she will be referred to genetics -Results were negative for any gene mutations.   3.  Chronic combined systolic and diastolic heart failure, EF 45-50% -Follow-up with her cardiologist Dr. Ellyn Hack  -repeat ECHO on 05/14/17 shows improved heart function  -She is on lasix and spironolactone, I may hold if she becomes dehydrated. She can continue aspirin.  -Will repeat ECHO after chemotherapy -I suggest if she drinks less she should cut back on her lasix to prevent dehydration. She should check her BP at home regularly. If her BP is low and she feels dizzy she should contact our clinic and hold lasix.   4. HTN, COPD, retinitis pigmentosa with limited vision -Follow-up with primary care physician -She is legally blind -I explained she may notice change in vision or mild blurred vision over time with chemotherapy. This is likely reversible after chemotherapy.   5. Financial Support -She met with our financial office -She qualified for [place]:  -Will write order for bedside commode  -Labs adequate  to proceed with cycle 1 of CT today, with onpro  -she will return in one week for toxicity checkup -Lab/follow-up and second cycle chemotherapy in 3 weeks -she will stop lasix and amlodipine for now, due to her low normal BP with these meds     No orders of the defined types were placed in this encounter.   All questions were answered. The patient knows to call the clinic with any problems, questions or concerns.  I spent 25 minutes counseling the patient face to face. The total time spent in the appointment was 35 minutes and more than 50% was on counseling.     [name], MD 07/25/2017 5:40 PM   This document serves as a record  of services personally performed by [name], MD. It was created on her behalf by Joslyn Devon, a trained medical scribe. The creation of this record is based on the scribe's personal observations and the provider's statements to them.     I have reviewed the above documentation for accuracy and completeness, and I agree with the above.

## 2017-07-25 ENCOUNTER — Encounter: Payer: Self-pay | Admitting: Hematology

## 2017-07-25 ENCOUNTER — Other Ambulatory Visit (HOSPITAL_BASED_OUTPATIENT_CLINIC_OR_DEPARTMENT_OTHER): Payer: Medicare HMO

## 2017-07-25 ENCOUNTER — Ambulatory Visit (HOSPITAL_BASED_OUTPATIENT_CLINIC_OR_DEPARTMENT_OTHER): Payer: Medicare HMO | Admitting: Hematology

## 2017-07-25 ENCOUNTER — Encounter: Payer: Self-pay | Admitting: *Deleted

## 2017-07-25 ENCOUNTER — Ambulatory Visit (HOSPITAL_BASED_OUTPATIENT_CLINIC_OR_DEPARTMENT_OTHER): Payer: Medicare HMO

## 2017-07-25 ENCOUNTER — Ambulatory Visit: Payer: Medicare HMO

## 2017-07-25 VITALS — BP 112/77 | HR 76 | Temp 97.7°F | Resp 17

## 2017-07-25 VITALS — BP 142/87 | HR 83 | Temp 98.2°F | Resp 20 | Ht 67.25 in | Wt 239.4 lb

## 2017-07-25 DIAGNOSIS — Z5111 Encounter for antineoplastic chemotherapy: Secondary | ICD-10-CM

## 2017-07-25 DIAGNOSIS — J449 Chronic obstructive pulmonary disease, unspecified: Secondary | ICD-10-CM | POA: Diagnosis not present

## 2017-07-25 DIAGNOSIS — C50512 Malignant neoplasm of lower-outer quadrant of left female breast: Secondary | ICD-10-CM

## 2017-07-25 DIAGNOSIS — Z171 Estrogen receptor negative status [ER-]: Secondary | ICD-10-CM

## 2017-07-25 DIAGNOSIS — H3589 Other specified retinal disorders: Secondary | ICD-10-CM

## 2017-07-25 DIAGNOSIS — I10 Essential (primary) hypertension: Secondary | ICD-10-CM | POA: Diagnosis not present

## 2017-07-25 DIAGNOSIS — I5042 Chronic combined systolic (congestive) and diastolic (congestive) heart failure: Secondary | ICD-10-CM | POA: Diagnosis not present

## 2017-07-25 DIAGNOSIS — Z95828 Presence of other vascular implants and grafts: Secondary | ICD-10-CM

## 2017-07-25 DIAGNOSIS — Z5189 Encounter for other specified aftercare: Secondary | ICD-10-CM | POA: Diagnosis not present

## 2017-07-25 LAB — COMPREHENSIVE METABOLIC PANEL
ALBUMIN: 4.3 g/dL (ref 3.5–5.0)
ALK PHOS: 106 U/L (ref 40–150)
ALT: 15 U/L (ref 0–55)
AST: 15 U/L (ref 5–34)
Anion Gap: 11 mEq/L (ref 3–11)
BILIRUBIN TOTAL: 0.42 mg/dL (ref 0.20–1.20)
BUN: 25.7 mg/dL (ref 7.0–26.0)
CO2: 20 mEq/L — ABNORMAL LOW (ref 22–29)
CREATININE: 0.9 mg/dL (ref 0.6–1.1)
Calcium: 10.2 mg/dL (ref 8.4–10.4)
Chloride: 109 mEq/L (ref 98–109)
EGFR: >60 mL/min/{1.73_m2} (ref >60)
GLUCOSE: 161 mg/dL — AB (ref 70–140)
Potassium: 4.3 mEq/L (ref 3.5–5.1)
SODIUM: 140 meq/L (ref 136–145)
TOTAL PROTEIN: 8.2 g/dL (ref 6.4–8.3)

## 2017-07-25 LAB — CBC WITH DIFFERENTIAL/PLATELET
BASO%: 0.1 % (ref 0.0–2.0)
Basophils Absolute: 0 10*3/uL (ref 0.0–0.1)
EOS%: 0 % (ref 0.0–7.0)
Eosinophils Absolute: 0 10*3/uL (ref 0.0–0.5)
HCT: 37.5 % (ref 34.8–46.6)
HGB: 12.4 g/dL (ref 11.6–15.9)
LYMPH%: 12.4 % — AB (ref 14.0–49.7)
MCH: 30 pg (ref 25.1–34.0)
MCHC: 33.1 g/dL (ref 31.5–36.0)
MCV: 90.6 fL (ref 79.5–101.0)
MONO#: 0.5 10*3/uL (ref 0.1–0.9)
MONO%: 3.6 % (ref 0.0–14.0)
NEUT#: 11.8 10*3/uL — ABNORMAL HIGH (ref 1.5–6.5)
NEUT%: 83.9 % — AB (ref 38.4–76.8)
PLATELETS: 324 10*3/uL (ref 145–400)
RBC: 4.14 10*6/uL (ref 3.70–5.45)
RDW: 13.7 % (ref 11.2–14.5)
WBC: 14 10*3/uL — ABNORMAL HIGH (ref 3.9–10.3)
lymph#: 1.7 10*3/uL (ref 0.9–3.3)

## 2017-07-25 MED ORDER — PALONOSETRON HCL INJECTION 0.25 MG/5ML
INTRAVENOUS | Status: AC
  Filled 2017-07-25: qty 5

## 2017-07-25 MED ORDER — SODIUM CHLORIDE 0.9% FLUSH
10.0000 mL | Freq: Once | INTRAVENOUS | Status: AC
  Administered 2017-07-25: 10 mL via INTRAVENOUS
  Filled 2017-07-25: qty 10

## 2017-07-25 MED ORDER — DOCETAXEL CHEMO INJECTION 160 MG/16ML
75.0000 mg/m2 | Freq: Once | INTRAVENOUS | Status: AC
  Administered 2017-07-25: 170 mg via INTRAVENOUS
  Filled 2017-07-25: qty 17

## 2017-07-25 MED ORDER — SODIUM CHLORIDE 0.9 % IV SOLN
600.0000 mg/m2 | Freq: Once | INTRAVENOUS | Status: AC
  Administered 2017-07-25: 1380 mg via INTRAVENOUS
  Filled 2017-07-25: qty 69

## 2017-07-25 MED ORDER — HEPARIN SOD (PORK) LOCK FLUSH 100 UNIT/ML IV SOLN
500.0000 [IU] | Freq: Once | INTRAVENOUS | Status: AC | PRN
  Administered 2017-07-25: 500 [IU]
  Filled 2017-07-25: qty 5

## 2017-07-25 MED ORDER — DEXAMETHASONE SODIUM PHOSPHATE 10 MG/ML IJ SOLN
INTRAMUSCULAR | Status: AC
  Filled 2017-07-25: qty 1

## 2017-07-25 MED ORDER — PEGFILGRASTIM 6 MG/0.6ML ~~LOC~~ PSKT
6.0000 mg | PREFILLED_SYRINGE | Freq: Once | SUBCUTANEOUS | Status: AC
  Administered 2017-07-25: 6 mg via SUBCUTANEOUS

## 2017-07-25 MED ORDER — SODIUM CHLORIDE 0.9 % IV SOLN
Freq: Once | INTRAVENOUS | Status: AC
  Administered 2017-07-25: 11:00:00 via INTRAVENOUS

## 2017-07-25 MED ORDER — DEXAMETHASONE SODIUM PHOSPHATE 10 MG/ML IJ SOLN
10.0000 mg | Freq: Once | INTRAMUSCULAR | Status: AC
  Administered 2017-07-25: 10 mg via INTRAVENOUS

## 2017-07-25 MED ORDER — PALONOSETRON HCL INJECTION 0.25 MG/5ML
0.2500 mg | Freq: Once | INTRAVENOUS | Status: AC
  Administered 2017-07-25: 0.25 mg via INTRAVENOUS

## 2017-07-25 MED ORDER — PEGFILGRASTIM 6 MG/0.6ML ~~LOC~~ PSKT
PREFILLED_SYRINGE | SUBCUTANEOUS | Status: AC
  Filled 2017-07-25: qty 0.6

## 2017-07-25 MED ORDER — SODIUM CHLORIDE 0.9% FLUSH
10.0000 mL | INTRAVENOUS | Status: DC | PRN
  Administered 2017-07-25: 10 mL
  Filled 2017-07-25: qty 10

## 2017-07-25 NOTE — Patient Instructions (Addendum)
[place] Discharge Instructions for Patients Receiving Chemotherapy  Today you received the following chemotherapy agents Taxotere, Cytoxan    Per Dr Burr Medico you are  to discontinue taking amlodipine and lasix this week until next visit with Dr Burr Medico.  You are to take Blood Pressure  and weight daily.  If your Blood Pressure increases to 180/90 you are to start taking amlodipine again and if your weight increases or you  noticed swelling (fluid retention) in your ankles or legs, you are to resume taking your lasix again. Also, Notify Dr Burr Medico. You are to continue taking Coreg however.  Reassessment to be done next week with Dr Burr Medico.   To help prevent nausea and vomiting after your treatment, we encourage you to take your nausea medication as prescribed.   If you develop nausea and vomiting that is not controlled by your nausea medication, call the clinic.   BELOW ARE SYMPTOMS THAT SHOULD BE REPORTED IMMEDIATELY:  *FEVER GREATER THAN 100.5 F  *CHILLS WITH OR WITHOUT FEVER  NAUSEA AND VOMITING THAT IS NOT CONTROLLED WITH YOUR NAUSEA MEDICATION  *UNUSUAL SHORTNESS OF BREATH  *UNUSUAL BRUISING OR BLEEDING  TENDERNESS IN MOUTH AND THROAT WITH OR WITHOUT PRESENCE OF ULCERS  *URINARY PROBLEMS  *BOWEL PROBLEMS  UNUSUAL RASH Items with * indicate a potential emergency and should be followed up as soon as possible.  Feel free to call the clinic should you have any questions or concerns. The clinic phone number is [phone].  Please show the [facility] at check-in to the Emergency Department and triage nurse.  Docetaxel injection (Taxotere) What is this medicine? DOCETAXEL (doe se TAX el) is a chemotherapy drug. It targets fast dividing cells, like cancer cells, and causes these cells to die. This medicine is used to treat many types of cancers like breast cancer, certain stomach cancers, head and neck cancer, lung cancer, and prostate cancer. This medicine  may be used for other purposes; ask your health care provider or pharmacist if you have questions. COMMON BRAND NAME(S): Docefrez, Taxotere What should I tell my health care provider before I take this medicine? They need to know if you have any of these conditions: -infection (especially a virus infection such as chickenpox, cold sores, or herpes) -liver disease -low blood counts, like low white cell, platelet, or red cell counts -an unusual or allergic reaction to docetaxel, polysorbate 80, other chemotherapy agents, other medicines, foods, dyes, or preservatives -pregnant or trying to get pregnant -breast-feeding How should I use this medicine? This drug is given as an infusion into a vein. It is administered in a hospital or clinic by a specially trained health care professional. Talk to your pediatrician regarding the use of this medicine in children. Special care may be needed. Overdosage: If you think you have taken too much of this medicine contact a poison control center or emergency room at once. NOTE: This medicine is only for you. Do not share this medicine with others. What if I miss a dose? It is important not to miss your dose. Call your doctor or health care professional if you are unable to keep an appointment. What may interact with this medicine? -cyclosporine -erythromycin -ketoconazole -medicines to increase blood counts like filgrastim, pegfilgrastim, sargramostim -vaccines Talk to your doctor or health care professional before taking any of these medicines: -acetaminophen -aspirin -ibuprofen -ketoprofen -naproxen This list may not describe all possible interactions. Give your health care provider a list of all the medicines, herbs,  non-prescription drugs, or dietary supplements you use. Also tell them if you smoke, drink alcohol, or use illegal drugs. Some items may interact with your medicine. What should I watch for while using this medicine? Your condition  will be monitored carefully while you are receiving this medicine. You will need important blood work done while you are taking this medicine. This drug may make you feel generally unwell. This is not uncommon, as chemotherapy can affect healthy cells as well as cancer cells. Report any side effects. Continue your course of treatment even though you feel ill unless your doctor tells you to stop. In some cases, you may be given additional medicines to help with side effects. Follow all directions for their use. Call your doctor or health care professional for advice if you get a fever, chills or sore throat, or other symptoms of a cold or flu. Do not treat yourself. This drug decreases your body's ability to fight infections. Try to avoid being around people who are sick. This medicine may increase your risk to bruise or bleed. Call your doctor or health care professional if you notice any unusual bleeding. This medicine may contain alcohol in the product. You may get drowsy or dizzy. Do not drive, use machinery, or do anything that needs mental alertness until you know how this medicine affects you. Do not stand or sit up quickly, especially if you are an older patient. This reduces the risk of dizzy or fainting spells. Avoid alcoholic drinks. Do not become pregnant while taking this medicine. Women should inform their doctor if they wish to become pregnant or think they might be pregnant. There is a potential for serious side effects to an unborn child. Talk to your health care professional or pharmacist for more information. Do not breast-feed an infant while taking this medicine. What side effects may I notice from receiving this medicine? Side effects that you should report to your doctor or health care professional as soon as possible: -allergic reactions like skin rash, itching or hives, swelling of the face, lips, or tongue -low blood counts - This drug may decrease the number of white blood cells,  red blood cells and platelets. You may be at increased risk for infections and bleeding. -signs of infection - fever or chills, cough, sore throat, pain or difficulty passing urine -signs of decreased platelets or bleeding - bruising, pinpoint red spots on the skin, black, tarry stools, nosebleeds -signs of decreased red blood cells - unusually weak or tired, fainting spells, lightheadedness -breathing problems -fast or irregular heartbeat -low blood pressure -mouth sores -nausea and vomiting -pain, swelling, redness or irritation at the injection site -pain, tingling, numbness in the hands or feet -swelling of the ankle, feet, hands -weight gain Side effects that usually do not require medical attention (report to your doctor or health care professional if they continue or are bothersome): -bone pain -complete hair loss including hair on your head, underarms, pubic hair, eyebrows, and eyelashes -diarrhea -excessive tearing -changes in the color of fingernails -loosening of the fingernails -nausea -muscle pain -red flush to skin -sweating -weak or tired This list may not describe all possible side effects. Call your doctor for medical advice about side effects. You may report side effects to FDA at 1-800-FDA-1088. Where should I keep my medicine? This drug is given in a hospital or clinic and will not be stored at home. NOTE: This sheet is a summary. It may not cover all possible information. If you have questions  about this medicine, talk to your doctor, pharmacist, or health care provider.  2018 Elsevier/Gold Standard (2015-08-31 12:32:56)  Cyclophosphamide injection (Cytoxan) What is this medicine? CYCLOPHOSPHAMIDE (sye kloe FOSS fa mide) is a chemotherapy drug. It slows the growth of cancer cells. This medicine is used to treat many types of cancer like lymphoma, myeloma, leukemia, breast cancer, and ovarian cancer, to name a few. This medicine may be used for other purposes;  ask your health care provider or pharmacist if you have questions. COMMON BRAND NAME(S): Cytoxan, Neosar What should I tell my health care provider before I take this medicine? They need to know if you have any of these conditions: -blood disorders -history of other chemotherapy -infection -kidney disease -liver disease -recent or ongoing radiation therapy -tumors in the bone marrow -an unusual or allergic reaction to cyclophosphamide, other chemotherapy, other medicines, foods, dyes, or preservatives -pregnant or trying to get pregnant -breast-feeding How should I use this medicine? This drug is usually given as an injection into a vein or muscle or by infusion into a vein. It is administered in a hospital or clinic by a specially trained health care professional. Talk to your pediatrician regarding the use of this medicine in children. Special care may be needed. Overdosage: If you think you have taken too much of this medicine contact a poison control center or emergency room at once. NOTE: This medicine is only for you. Do not share this medicine with others. What if I miss a dose? It is important not to miss your dose. Call your doctor or health care professional if you are unable to keep an appointment. What may interact with this medicine? This medicine may interact with the following medications: -amiodarone -amphotericin B -azathioprine -certain antiviral medicines for HIV or AIDS such as protease inhibitors (e.g., indinavir, ritonavir) and zidovudine -certain blood pressure medications such as benazepril, captopril, enalapril, fosinopril, lisinopril, moexipril, monopril, perindopril, quinapril, ramipril, trandolapril -certain cancer medications such as anthracyclines (e.g., daunorubicin, doxorubicin), busulfan, cytarabine, paclitaxel, pentostatin, tamoxifen, trastuzumab -certain diuretics such as chlorothiazide, chlorthalidone, hydrochlorothiazide, indapamide,  metolazone -certain medicines that treat or prevent blood clots like warfarin -certain muscle relaxants such as succinylcholine -cyclosporine -etanercept -indomethacin -medicines to increase blood counts like filgrastim, pegfilgrastim, sargramostim -medicines used as general anesthesia -metronidazole -natalizumab This list may not describe all possible interactions. Give your health care provider a list of all the medicines, herbs, non-prescription drugs, or dietary supplements you use. Also tell them if you smoke, drink alcohol, or use illegal drugs. Some items may interact with your medicine. What should I watch for while using this medicine? Visit your doctor for checks on your progress. This drug may make you feel generally unwell. This is not uncommon, as chemotherapy can affect healthy cells as well as cancer cells. Report any side effects. Continue your course of treatment even though you feel ill unless your doctor tells you to stop. Drink water or other fluids as directed. Urinate often, even at night. In some cases, you may be given additional medicines to help with side effects. Follow all directions for their use. Call your doctor or health care professional for advice if you get a fever, chills or sore throat, or other symptoms of a cold or flu. Do not treat yourself. This drug decreases your body's ability to fight infections. Try to avoid being around people who are sick. This medicine may increase your risk to bruise or bleed. Call your doctor or health care professional if you notice any unusual  bleeding. Be careful brushing and flossing your teeth or using a toothpick because you may get an infection or bleed more easily. If you have any dental work done, tell your dentist you are receiving this medicine. You may get drowsy or dizzy. Do not drive, use machinery, or do anything that needs mental alertness until you know how this medicine affects you. Do not become pregnant while  taking this medicine or for 1 year after stopping it. Women should inform their doctor if they wish to become pregnant or think they might be pregnant. Men should not father a child while taking this medicine and for 4 months after stopping it. There is a potential for serious side effects to an unborn child. Talk to your health care professional or pharmacist for more information. Do not breast-feed an infant while taking this medicine. This medicine may interfere with the ability to have a child. This medicine has caused ovarian failure in some women. This medicine has caused reduced sperm counts in some men. You should talk with your doctor or health care professional if you are concerned about your fertility. If you are going to have surgery, tell your doctor or health care professional that you have taken this medicine. What side effects may I notice from receiving this medicine? Side effects that you should report to your doctor or health care professional as soon as possible: -allergic reactions like skin rash, itching or hives, swelling of the face, lips, or tongue -low blood counts - this medicine may decrease the number of white blood cells, red blood cells and platelets. You may be at increased risk for infections and bleeding. -signs of infection - fever or chills, cough, sore throat, pain or difficulty passing urine -signs of decreased platelets or bleeding - bruising, pinpoint red spots on the skin, black, tarry stools, blood in the urine -signs of decreased red blood cells - unusually weak or tired, fainting spells, lightheadedness -breathing problems -dark urine -dizziness -palpitations -swelling of the ankles, feet, hands -trouble passing urine or change in the amount of urine -weight gain -yellowing of the eyes or skin Side effects that usually do not require medical attention (report to your doctor or health care professional if they continue or are bothersome): -changes in nail  or skin color -hair loss -missed menstrual periods -mouth sores -nausea, vomiting This list may not describe all possible side effects. Call your doctor for medical advice about side effects. You may report side effects to FDA at 1-800-FDA-1088. Where should I keep my medicine? This drug is given in a hospital or clinic and will not be stored at home. NOTE: This sheet is a summary. It may not cover all possible information. If you have questions about this medicine, talk to your doctor, pharmacist, or health care provider.  2018 Elsevier/Gold Standard (2012-06-12 16:22:58)

## 2017-07-25 NOTE — Progress Notes (Signed)
Met with patient and daughter whom brought proof of income for [employer].  Patient approved for one-time $1000 grant. Patient has a copy of the award letter as well as expense sheet. Assisted patient with bills needed to be paid. Patient has my card for any additional financial questions or concerns.

## 2017-07-25 NOTE — Progress Notes (Signed)
r/t hypotension today and not having taken amlodipine, lasix, or Coreg this AM, per Dr Burr Medico pt to discontinue taking amlodipine and lasix this week until next visit with Dr Burr Medico. Pt to take BP and weight daily. If BP increases pt to start taking amlodipine again and if weight increases or noticed swelling (fluid retention) in ankles or LE, pt to resume lasix. Pt to continue taking Coreg however. Reassessment to be done next week with Dr Burr Medico. Pt verbalized understanding.

## 2017-07-31 ENCOUNTER — Encounter: Payer: Self-pay | Admitting: Physical Therapy

## 2017-07-31 ENCOUNTER — Ambulatory Visit: Payer: Medicare HMO | Attending: General Surgery | Admitting: Physical Therapy

## 2017-07-31 ENCOUNTER — Telehealth: Payer: Self-pay | Admitting: *Deleted

## 2017-07-31 DIAGNOSIS — C50512 Malignant neoplasm of lower-outer quadrant of left female breast: Secondary | ICD-10-CM | POA: Diagnosis present

## 2017-07-31 DIAGNOSIS — Z171 Estrogen receptor negative status [ER-]: Secondary | ICD-10-CM | POA: Diagnosis present

## 2017-07-31 DIAGNOSIS — R293 Abnormal posture: Secondary | ICD-10-CM

## 2017-07-31 NOTE — Telephone Encounter (Signed)
Received call from PT/[facility] stating that pt was in their lobby c/o dizziness, indigestion with pain R abd side & nausea.  Spoke to pt's daughter & she had not taken any nausea meds.  She states that she called [facility] regarding indigestion & whether she could take tums.  Daughter reports pt is drinking well & urinating well & is not on her lasix or two of her BP meds.  She is not vomiting of having diarrhea.  Instructed to go home & take nausea meds, tums or pepcid & rest & to call if she is not better.  Daughter is in agreement.  [facility]at bus to pick her up. Dr Burr Medico informed.

## 2017-07-31 NOTE — Therapy (Signed)
[place], [place], [address] Phone: [phone]   Fax:  [phone]  Physical Therapy Treatment  Patient Details  Name: Kristin Ward MRN: [id number] Date of Birth: Nov 17, 1955 Referring Provider: Dr. [name]   Encounter Date: 07/31/2017  PT End of Session - 07/31/17 1128    Visit Number  2    Number of Visits  2    PT Start Time  1058    PT Stop Time  1130    PT Time Calculation (min)  32 min    Activity Tolerance  Treatment limited secondary to medical complications (Comment) Began feeling ill due to chemotherapy    Behavior During Therapy  Restless       Past Medical History:  Diagnosis Date  . Abnormal myocardial perfusion study 03/15/2016   EF 15%, severe LV systolic dysfcn, global hypokinesis and inferior akinesis. dilated LV  . Abnormal x-ray of lungs with single pulmonary nodule 03/15/2016   per records from [place]   . Blind   . Cholelithiasis 05/19/2017   On CT  . COPD (chronic obstructive pulmonary disease) ([place])    PT DENIES  . Coronary artery calcification seen on CAT scan 05/19/2017  . Dilated idiopathic cardiomyopathy ([place]) 12/2015   EF 15-20%. Diagnosed in [facility]  . Family history of colon cancer   . Genetic testing 06/19/2017   Multi-Cancer panel (83 genes) @ [facility] - No pathogenic mutations detected  . Headache    NONE RECENT  . Hypertension   . Malignant neoplasm of lower-outer quadrant of left breast of female, estrogen receptor negative ([place]) 06/11/2017  . Retinitis pigmentosa   . Solitary pulmonary nodule on lung CT 03/16/2016   per records from [place]  . Uveitis     Past Surgical History:  Procedure Laterality Date  . brain cyst removed  2007   to help with headaches per patient , aspirated   . BREAST LUMPECTOMY WITH RADIOACTIVE SEED AND SENTINEL LYMPH NODE BIOPSY Left 06/26/2017   Procedure: BREAST LUMPECTOMY WITH  RADIOACTIVE SEED AND SENTINEL LYMPH NODE BIOPSY ERAS  PATHWAY;  Surgeon: [name], MD;  Location: [facility];  Service: General;  Laterality: Left;  pec block  . NASAL TURBINATE REDUCTION    . PORTACATH PLACEMENT N/A 06/26/2017   Procedure: INSERTION PORT-A-CATH;  Surgeon: [name], MD;  Location: [place];  Service: General;  Laterality: N/A;  . TRANSTHORACIC ECHOCARDIOGRAM  12/2015   [facility] May 2017: Mild concentric LVH. Global hypokinesis. GR daily. EF 18% severe LA dilation. Mitral annular dilatation with papillary muscle dysfunction and moderate MR. Dilated IVC consistent with elevated RAP.    There were no vitals filed for this visit.  Subjective Assessment - 07/31/17 1103    Subjective  Patient underwent a left lumpectomy and sentinel node biopsy with 3 negative nodes removed on 06/26/17. Currently undergoing chemotherapy and will undergo radiation.    Patient is accompained by:  Family member    Pertinent History  Patient was diagnosed on 05/20/17 with left triple negative breast cancer. It measured 1.4 cm and is located in the lower outer quadrant with a Ki67 of 90%. She is legally blind and has hypertension. Left lumpectomy and sentinel node biopsy 06/26/17.     Patient Stated Goals  Make sure my arm is ok    Currently in Pain?  No/denies         [facility] PT Assessment - 07/31/17 0001      Assessment  Medical Diagnosis  s/p left lumpectomy    Referring Provider  Dr. [name]    Onset Date/Surgical Date  06/26/17    Hand Dominance  Right    Prior Therapy  none      Precautions   Precautions  Other (comment)    Precaution Comments  recent surgery; left arm lymphedema risk      Restrictions   Weight Bearing Restrictions  No      Balance Screen   Has the patient fallen in the past 6 months  No    Has the patient had a decrease in activity level because of a fear of falling?   No    Is the patient reluctant to leave their home because of a fear of falling?    No      Home Environment   Living Environment  Private residence    Living Arrangements  Children    Available Help at Discharge  Family      Prior Function   Level of [place]  On disability    Leisure  She is not exercising      Cognition   Overall Cognitive Status  Within Functional Limits for tasks assessed      Posture/Postural Control   Posture/Postural Control  Postural limitations    Postural Limitations  Rounded Shoulders;Forward head      ROM / Strength   AROM / PROM / Strength  AROM;Strength      AROM   AROM Assessment Site  Shoulder;Cervical    Right/Left Shoulder  Left    Left Shoulder Extension  59 Degrees    Left Shoulder Flexion  129 Degrees    Left Shoulder ABduction  136 Degrees      Strength   Overall Strength  Within functional limits for tasks performed      Palpation   Palpation comment  Unable to assess as pt became ill during assessment but reported incisions have healed well.        LYMPHEDEMA/ONCOLOGY QUESTIONNAIRE - 07/31/17 1109      Type   Cancer Type  s/p left lumpectomy      Surgeries   Lumpectomy Date  06/26/17    Sentinel Lymph Node Biopsy Date  06/26/17    Number Lymph Nodes Removed  3      Treatment   Active Chemotherapy Treatment  No    Past Chemotherapy Treatment  Yes    Date  07/25/17    Active Radiation Treatment  No    Past Radiation Treatment  No    Current Hormone Treatment  No    Past Hormone Therapy  No      What other symptoms do you have   Are you Having Heaviness or Tightness  No    Are you having Pain  No    Are you having pitting edema  No    Is it Hard or Difficult finding clothes that fit  No    Do you have infections  No    Is there Decreased scar mobility  No    Stemmer Sign  No      Right Upper Extremity Lymphedema   10 cm Proximal to Olecranon Process  37 cm    Olecranon Process  28.8 cm    10 cm Proximal to Ulnar Styloid Process  24.7 cm    Just Proximal to  Ulnar Styloid Process  17.8 cm    Across Hand at  Thumb [employer] Space  20.1 cm    At [place] of 2nd Digit  6.2 cm      Left Upper Extremity Lymphedema   10 cm Proximal to Olecranon Process  37.2 cm    Olecranon Process  27.5 cm    10 cm Proximal to Ulnar Styloid Process  24 cm    Just Proximal to Ulnar Styloid Process  17.5 cm    Across Hand at [employer]  19.2 cm    At [place] of 2nd Digit  5.9 cm        Quick Dash - 07/31/17 0001    Open a tight or new jar  No difficulty    Do heavy household chores (wash walls, wash floors)  Moderate difficulty    Carry a shopping bag or briefcase  No difficulty    Wash your back  No difficulty    Use a knife to cut food  No difficulty    Recreational activities in which you take some force or impact through your arm, shoulder, or hand (golf, hammering, tennis)  No difficulty    During the past week, to what extent has your arm, shoulder or hand problem interfered with your normal social activities with family, friends, neighbors, or groups?  Not at all    During the past week, to what extent has your arm, shoulder or hand problem limited your work or other regular daily activities  Not at all    Arm, shoulder, or hand pain.  None    Tingling (pins and needles) in your arm, shoulder, or hand  None    Difficulty Sleeping  No difficulty    DASH Score  4.55 %                          Breast Clinic Goals - 06/18/17 1435      Patient will be able to verbalize understanding of pertinent lymphedema risk reduction practices relevant to her diagnosis specifically related to skin care.   Time  1    Period  Days    Status  Achieved      Patient will be able to return demonstrate and/or verbalize understanding of the post-op home exercise program related to regaining shoulder range of motion.   Time  1    Period  Days    Status  Achieved      Patient will be able to verbalize understanding of the importance of attending the  postoperative After Breast Cancer Class for further lymphedema risk reduction education and therapeutic exercise.   Time  1    Period  Days    Status  Achieved       Long Term Clinic Goals - 07/31/17 1136      CC Long Term Goal  #1   Title  Patient will demonstrate ROM and function returned to baseline for left shoulder.    Time  8    Period  Weeks    Status  Achieved         Plan - 07/31/17 1130    Clinical Impression Statement  Patient underwent a left lumpectomy and sentinel node on 06/26/17. She appears to be healing well from her surgery and shoulder ROM and circumferential measurements are back to baseline. However, she appears to be having some significant side effects from starting chemotherapy last week. She is complaining of abdominal pain, dizziness, and nausea today. She did not tolerate a full assessment  today due to not feeling well. The nurse at the oncologist's office was called and the nurse spoke to her daughter and instructed her with appropriate care. There is no need for PT at this time.    PT Treatment/Interventions  ADLs/Self Care Home Management;Therapeutic exercise;Patient/family education    PT Next Visit Plan  D/C from PT; no further needs at this time    PT Home Exercise Plan  post op shoulder ROM HEP    Consulted and Agree with Plan of Care  Patient;Family member/caregiver    Family Member Consulted  Daughter       Patient will benefit from skilled therapeutic intervention in order to improve the following deficits and impairments:  Pain, Postural dysfunction, Impaired UE functional use, Decreased range of motion, Decreased knowledge of precautions  Visit Diagnosis: Abnormal posture  Malignant neoplasm of lower-outer quadrant of left breast of female, estrogen receptor negative ([place])   G-Codes - 18-Aug-2017 1136    Functional Assessment Tool Used (Outpatient Only)  Clinical Judgement    Functional Limitation  Other PT primary    Other PT Primary Goal  Status (H0689)  At least 1 percent but less than 20 percent impaired, limited or restricted    Other PT Primary Discharge Status (N4068)  At least 1 percent but less than 20 percent impaired, limited or restricted       Problem List Patient Active Problem List   Diagnosis Date Noted  . Genetic testing 06/19/2017  . Family history of colon cancer   . Malignant neoplasm of lower-outer quadrant of left breast of female, estrogen receptor negative ([place]) 06/11/2017  . Cholelithiasis 05/19/2017  . Coronary artery calcification seen on CAT scan 05/19/2017  . Low grade squamous intraepith lesion on cytologic smear cervix (lgsil) 04/09/2017  . HPV (human papilloma virus) infection 04/09/2017  . COPD (chronic obstructive pulmonary disease) ([place])   . Essential hypertension 03/20/2017  . Screen for colon cancer 03/20/2017  . Retinitis pigmentosa   . Blind   . Solitary pulmonary nodule on lung CT 03/16/2016  . Abnormal myocardial perfusion study 03/15/2016  . Abnormal x-ray of lungs with single pulmonary nodule 03/15/2016  . Chronic combined systolic and diastolic heart failure ([place]) 12/28/2015  . Dilated cardiomyopathy ([place]) 12/27/2015    Annia Friendly, PT 08/18/2017 11:37 AM  [place], [place], [address] Phone: [phone]   Fax:  [phone]  Name: Alyrica Thurow MRN: [id number] Date of Birth: 06/13/1956

## 2017-08-01 ENCOUNTER — Other Ambulatory Visit (HOSPITAL_BASED_OUTPATIENT_CLINIC_OR_DEPARTMENT_OTHER): Payer: Medicare HMO

## 2017-08-01 ENCOUNTER — Ambulatory Visit (HOSPITAL_BASED_OUTPATIENT_CLINIC_OR_DEPARTMENT_OTHER): Payer: Medicare HMO

## 2017-08-01 ENCOUNTER — Other Ambulatory Visit: Payer: Self-pay

## 2017-08-01 ENCOUNTER — Ambulatory Visit (HOSPITAL_BASED_OUTPATIENT_CLINIC_OR_DEPARTMENT_OTHER): Payer: Medicare HMO | Admitting: Nurse Practitioner

## 2017-08-01 ENCOUNTER — Encounter: Payer: Self-pay | Admitting: Nurse Practitioner

## 2017-08-01 VITALS — BP 109/75 | HR 98 | Temp 98.5°F | Resp 18 | Ht 67.25 in | Wt 236.3 lb

## 2017-08-01 VITALS — BP 102/70 | HR 91 | Temp 99.2°F | Resp 18

## 2017-08-01 DIAGNOSIS — E860 Dehydration: Secondary | ICD-10-CM

## 2017-08-01 DIAGNOSIS — Z171 Estrogen receptor negative status [ER-]: Principal | ICD-10-CM

## 2017-08-01 DIAGNOSIS — I5042 Chronic combined systolic (congestive) and diastolic (congestive) heart failure: Secondary | ICD-10-CM

## 2017-08-01 DIAGNOSIS — Z170 Estrogen receptor positive status [ER+]: Principal | ICD-10-CM

## 2017-08-01 DIAGNOSIS — R52 Pain, unspecified: Secondary | ICD-10-CM

## 2017-08-01 DIAGNOSIS — C50512 Malignant neoplasm of lower-outer quadrant of left female breast: Secondary | ICD-10-CM

## 2017-08-01 DIAGNOSIS — R197 Diarrhea, unspecified: Secondary | ICD-10-CM

## 2017-08-01 LAB — COMPREHENSIVE METABOLIC PANEL
ALBUMIN: 3.8 g/dL (ref 3.5–5.0)
ALK PHOS: 136 U/L (ref 40–150)
ALT: 40 U/L (ref 0–55)
ANION GAP: 9 meq/L (ref 3–11)
AST: 37 U/L — ABNORMAL HIGH (ref 5–34)
BILIRUBIN TOTAL: 0.6 mg/dL (ref 0.20–1.20)
BUN: 14.5 mg/dL (ref 7.0–26.0)
CO2: 21 meq/L — AB (ref 22–29)
CREATININE: 0.9 mg/dL (ref 0.6–1.1)
Calcium: 10.6 mg/dL — ABNORMAL HIGH (ref 8.4–10.4)
Chloride: 108 mEq/L (ref 98–109)
GLUCOSE: 104 mg/dL (ref 70–140)
Potassium: 4.2 mEq/L (ref 3.5–5.1)
Sodium: 138 mEq/L (ref 136–145)
TOTAL PROTEIN: 7.3 g/dL (ref 6.4–8.3)

## 2017-08-01 LAB — CBC WITH DIFFERENTIAL/PLATELET
BASO%: 0.4 % (ref 0.0–2.0)
BASOS ABS: 0.1 10*3/uL (ref 0.0–0.1)
EOS%: 0.5 % (ref 0.0–7.0)
Eosinophils Absolute: 0.1 10*3/uL (ref 0.0–0.5)
HCT: 37.6 % (ref 34.8–46.6)
HGB: 12.5 g/dL (ref 11.6–15.9)
LYMPH%: 12.1 % — AB (ref 14.0–49.7)
MCH: 30.1 pg (ref 25.1–34.0)
MCHC: 33.2 g/dL (ref 31.5–36.0)
MCV: 90.5 fL (ref 79.5–101.0)
MONO#: 1.7 10*3/uL — ABNORMAL HIGH (ref 0.1–0.9)
MONO%: 8 % (ref 0.0–14.0)
NEUT#: 16.6 10*3/uL — ABNORMAL HIGH (ref 1.5–6.5)
NEUT%: 79 % — AB (ref 38.4–76.8)
Platelets: 253 10*3/uL (ref 145–400)
RBC: 4.16 10*6/uL (ref 3.70–5.45)
RDW: 14.1 % (ref 11.2–14.5)
WBC: 21 10*3/uL — ABNORMAL HIGH (ref 3.9–10.3)
lymph#: 2.5 10*3/uL (ref 0.9–3.3)

## 2017-08-01 MED ORDER — NAPROXEN 500 MG PO TABS: 500.0000 mg | ORAL_TABLET | Freq: Every day | ORAL | 1 refills | Status: DC | PRN

## 2017-08-01 MED ORDER — HEPARIN SOD (PORK) LOCK FLUSH 100 UNIT/ML IV SOLN
500.0000 [IU] | Freq: Once | INTRAVENOUS | Status: AC
  Administered 2017-08-01: 500 [IU]
  Filled 2017-08-01: qty 5

## 2017-08-01 MED ORDER — SODIUM CHLORIDE 0.9 % IV SOLN
500.0000 mL | Freq: Once | INTRAVENOUS | Status: AC
  Administered 2017-08-01: 500 mL via INTRAVENOUS

## 2017-08-01 MED ORDER — LOPERAMIDE HCL 2 MG PO TABS
4.0000 mg | ORAL_TABLET | Freq: Once | ORAL | Status: AC
  Administered 2017-08-01: 4 mg via ORAL
  Filled 2017-08-01: qty 2

## 2017-08-01 MED ORDER — LOPERAMIDE HCL 2 MG PO CAPS
ORAL_CAPSULE | ORAL | Status: AC
  Filled 2017-08-01: qty 2

## 2017-08-01 MED ORDER — OXYCODONE HCL 5 MG PO TABS: 5.0000 mg | ORAL_TABLET | Freq: Four times a day (QID) | ORAL | 0 refills | Status: DC | PRN

## 2017-08-01 MED ORDER — SODIUM CHLORIDE 0.9 % IJ SOLN
10.0000 mL | Freq: Once | INTRAMUSCULAR | Status: AC
  Administered 2017-08-01: 10 mL
  Filled 2017-08-01: qty 10

## 2017-08-01 NOTE — Progress Notes (Signed)
[facility]  [phone] [phone]  Clinic Follow up Note   Patient Care Team: Girtha Rm, NP-C as PCP - General (Family Medicine) [name], MD as Consulting Physician (Hematology) [name], MD as Consulting Physician (General Surgery) 08/01/2017  CHIEF COMPLAINT: F/u left breast cancer, triple negative  SUMMARY OF ONCOLOGIC HISTORY: Oncology History   Cancer Staging Malignant neoplasm of lower-outer quadrant of left breast of female, estrogen receptor negative ([place]) Staging form: Breast, AJCC 8th Edition - Clinical stage from 06/06/2017: Stage IB (cT1c, cN0, cM0, G3, ER: Negative, PR: Negative, HER2: Negative) - Signed by [name], MD on 06/15/2017 - Pathologic stage from 06/26/2017: Stage IB (pT1c, pN0, cM0, G3, ER: Negative, PR: Negative, HER2: Negative) - Signed by [name], MD on 07/10/2017       Malignant neoplasm of lower-outer quadrant of left breast of female, estrogen receptor negative ([place])   05/30/2017 Mammogram    Diagnostic mammo and US IMPRESSION: 1. Highly suspicious 1.4 cm mass in the slightly lower slightly outer left breast -tissue sampling recommended. 2. Indeterminate 0.5 mm mass in the slightly lower slightly outer left breast -tissue sampling recommended. 3. At least 2 left axillary lymph nodes with borderline cortical thickness.      06/06/2017 Initial Biopsy    Diagnosis 1. Breast, left, needle core biopsy, 5:30 o'clock - INVASIVE DUCTAL CARCINOMA, G3 2. Lymph node, needle/core biopsy, left axillary - NO CARCINOMA IDENTIFIED IN ONE LYMPH NODE (0/1)      06/06/2017 Initial Diagnosis    Malignant neoplasm of lower-outer quadrant of left breast of female, estrogen receptor negative ([place])      06/06/2017 Receptors her2    Estrogen Receptor: 0%, NEGATIVE Progesterone Receptor: 0%, NEGATIVE Proliferation Marker Ki67: 70%      06/26/2017 Surgery    LEFT BREAST LUMPECTOMY WITH RADIOACTIVE SEED  AND SENTINEL LYMPH NODE BIOPSY ERAS  PATHWAY AND INSERTION PORT-A-CATH By Dr. Barry Dienes on 06/26/17       06/26/2017 Pathology Results    Diagnosis 06/26/17  1. Breast, lumpectomy, Left - INVASIVE DUCTAL CARCINOMA, GRADE III/III, SPANNING 1.2 CM. - THE SURGICAL RESECTION MARGINS ARE NEGATIVE FOR CARCINOMA. - SEE ONCOLOGY TABLE BELOW. 2. Lymph node, sentinel, biopsy, Left axillary #1 - THERE IS NO EVIDENCE OF CARCINOMA IN 1 OF 1 LYMPH NODE (0/1). 3. Lymph node, sentinel, biopsy, Left axillary #2 - THERE IS NO EVIDENCE OF CARCINOMA IN 1 OF 1 LYMPH NODE (0/1). 4. Lymph node, sentinel, biopsy, Left axillary #3 - THERE IS NO EVIDENCE OF CARCINOMA IN 1 OF 1 LYMPH NODE (0/1).       07/25/2017 -  Chemotherapy    CT every 3 weekd for 4 cycles plan to start on 07/25/17       CURRENT THERAPY: adjuvant chemo TC every 3 weeks for 4 cycles starting 07/25/17  INTERVAL HISTORY: Ms. Sweezy returns for toxicity check 1 week s/p 1st cycle adjuvant TC with neulasta on 12/14. She has an appetite and drinking moderately well. Had intermittent nausea 3 times this week, no vomiting. Gets heartburn after eating that is worse with compazine. Frequent diarrhea >4 episodes every day for the last week, has not used imodium. Moderate fatigue and tiredness, has not left the house much all week. Felt lightheaded and dizzy yesterday during rehab yesterday but improved today. Mild-moderate bone pain and body aches on days 5-6, improved with oxycodone and naprosyn; she did not take claritin. She took decadron incorrectly 1 tab TID on day after chemo instead  of 2 tabs BID day before and x3 days after chemo.   REVIEW OF SYSTEMS:   Constitutional: Denies fevers, chills (+) abnormal weight loss (+) moderate fatigue, tiredness  Eyes: Denies blurriness of vision Ears, nose, mouth, throat, and face: Denies mucositis or sore throat (+) gums feel "inflammed"  Respiratory: Denies cough, dyspnea or wheezes Cardiovascular: Denies  palpitation, chest discomfort or lower extremity swelling Gastrointestinal:  Denies emesis, constipation, blood in stool (+) intermittent mild nausea (+) heartburn with meals, worse with compazine (+) diarrhea every day since chemo >4 episodes Skin: Denies abnormal skin rashes Lymphatics: Denies new lymphadenopathy or easy bruising Neurological:Denies numbness, tingling or new weaknesses (+) intermittent lightheadedness, dizziness; no fall  Behavioral/Psych: Mood is stable, no new changes  MSK: (+) mild-mod bone pain days 5-6  All other systems were reviewed with the patient and are negative.  MEDICAL HISTORY:  Past Medical History:  Diagnosis Date  . Abnormal myocardial perfusion study 03/15/2016   EF 15%, severe LV systolic dysfcn, global hypokinesis and inferior akinesis. dilated LV  . Abnormal x-ray of lungs with single pulmonary nodule 03/15/2016   per records from [place]   . Blind   . Cholelithiasis 05/19/2017   On CT  . COPD (chronic obstructive pulmonary disease) ([place])    PT DENIES  . Coronary artery calcification seen on CAT scan 05/19/2017  . Dilated idiopathic cardiomyopathy ([place]) 12/2015   EF 15-20%. Diagn[facility]  . Family history of colon cancer   . Genetic testing 06/19/2017   Multi-Cancer panel (83 genes) @ [facility] - No pathogenic mutations detected  . Headache    NONE RECENT  . Hypertension   . Malignant neoplasm of lower-outer quadrant of left breast of female, estrogen receptor negative ([place]) 06/11/2017  . Retinitis pigmentosa   . Solitary pulmonary nodule on lung CT 03/16/2016   per records from [place]  . Uveitis     SURGICAL HISTORY: Past Surgical History:  Procedure Laterality Date  . brain cyst removed  2007   to help with headaches per patient , aspirated   . BREAST LUMPECTOMY WITH RADIOACTIVE SEED AND SENTINEL LYMPH NODE BIOPSY Left 06/26/2017   Procedure: BREAST LUMPECTOMY WITH RADIOACTIVE SEED AND SENTINEL LYMPH NODE  BIOPSY ERAS  PATHWAY;  Surgeon: [name], MD;  Location: [facility] [facility];  Service: General;  Laterality: Left;  pec block  . NASAL TURBINATE REDUCTION    . PORTACATH PLACEMENT N/A 06/26/2017   Procedure: INSERTION PORT-A-CATH;  Surgeon: [name], MD;  Location: [facility];  Service: General;  Laterality: N/A;  . TRANSTHORACIC ECHOCARDIOGRAM  12/2015   [facility] May 2017: Mild concentric LVH. Global hypokinesis. GR daily. EF 18% severe LA dilation. Mitral annular dilatation with papillary muscle dysfunction and moderate MR. Dilated IVC consistent with elevated RAP.    I have reviewed the social history and family history with the patient and they are unchanged from previous note.  ALLERGIES:  is allergic to latex; tylenol with codeine #3 [acetaminophen-codeine]; and penicillins.  MEDICATIONS:  Current Outpatient Medications  Medication Sig Dispense Refill  . carvedilol (COREG) 12.5 MG tablet Take 1 tablet (12.5 mg total) by mouth 2 (two) times daily with a meal. 60 tablet 5  . dexamethasone (DECADRON) 4 MG tablet Take 2 tablets (8 mg total) by mouth 2 (two) times daily. Start the day before Taxotere. Then again the day after chemo for 3 days. 30 tablet 1  . lidocaine-prilocaine (EMLA) cream Apply to affected area once 30 g 3  .  naproxen (NAPROSYN) 500 MG tablet Take 1 tablet (500 mg total) by mouth daily as needed for mild pain. 20 tablet 1  . ondansetron (ZOFRAN) 8 MG tablet Take 1 tablet (8 mg total) by mouth 2 (two) times daily as needed for refractory nausea / vomiting. Start on day 3 after chemo. 30 tablet 1  . oxyCODONE (OXY IR/ROXICODONE) 5 MG immediate release tablet Take 1-2 tablets (5-10 mg total) by mouth every 6 (six) hours as needed for moderate pain, severe pain or breakthrough pain. 20 tablet 0  . prochlorperazine (COMPAZINE) 10 MG tablet Take 1 tablet (10 mg total) by mouth every 6 (six) hours as needed (Nausea or vomiting). 30 tablet 1  . sacubitril-valsartan (ENTRESTO)  24-26 MG Take 1 tablet by mouth 2 (two) times daily. 60 tablet 5  . spironolactone (ALDACTONE) 25 MG tablet Take 1 tablet (25 mg total) by mouth daily. (Patient taking differently: Take 25 mg every evening by mouth. ) 30 tablet 5  . valACYclovir (VALTREX) 1000 MG tablet Take 1 tablet (1,000 mg total) by mouth daily. 7 tablet 6  . vitamin C (ASCORBIC ACID) 500 MG tablet Take 1,000 mg 2 (two) times daily by mouth.    Marland Kitchen amLODipine (NORVASC) 5 MG tablet Take 1 tablet (5 mg total) by mouth daily. 30 tablet 5  . aspirin 81 MG chewable tablet Chew 81 mg by mouth daily.    . furosemide (LASIX) 40 MG tablet Take 1 tablet (40 mg total) by mouth daily. (Patient not taking: Reported on 08/01/2017) 30 tablet 5   Current Facility-Administered Medications  Medication Dose Route Frequency Provider Last Rate Last Dose  . 0.9 %  sodium chloride infusion  500 mL Intravenous Once Alla Feeling, NP 250 mL/hr at 08/01/17 1100 500 mL at 08/01/17 1100    PHYSICAL EXAMINATION: ECOG PERFORMANCE STATUS: 3 - Symptomatic, >50% confined to bed  Vitals:   08/01/17 0922  BP: 109/75  Pulse: 98  Resp: 18  Temp: 98.5 F (36.9 C)  SpO2: 100%   Filed Weights   08/01/17 0922  Weight: 236 lb 4.8 oz (107.2 kg)    GENERAL:alert, no distress and comfortable; side-lying on exam table SKIN: skin color, texture, turgor are normal, no rashes or significant lesions EYES: normal, conjunctiva are pink and non-injected, sclera clear OROPHARYNX:no exudate, no erythema and lips, buccal mucosa, and tongue normal  NECK: supple, thyroid normal size, non-tender, without nodularity LYMPH:  no palpable cervical, supraclavicular, or axillary lymphadenopathy LUNGS: clear to auscultation bilaterally with normal breathing effort HEART: regular rate & rhythm and no murmurs and no lower extremity edema ABDOMEN:abdomen soft and normal bowel sounds (+) slight RUQ tenderness on deep palpation Musculoskeletal:no cyanosis of digits and no  clubbing  NEURO: alert & oriented x 3 with fluent speech, no focal motor/sensory deficits BREASTS: (+) s/p left lumpectomy; surgical incision in left breast and axilla are well healed; no palpable mass that I could appreciate; right breast benign  PAC without erythema  LABORATORY DATA:  I have reviewed the data as listed CBC Latest Ref Rng & Units 08/01/2017 07/25/2017 06/24/2017  WBC 3.9 - 10.3 10e3/uL 21.0(H) 14.0(H) 7.1  Hemoglobin 11.6 - 15.9 g/dL 12.5 12.4 13.0  Hematocrit 34.8 - 46.6 % 37.6 37.5 38.8  Platelets 145 - 400 10e3/uL 253 324 305     CMP Latest Ref Rng & Units 08/01/2017 07/25/2017 06/24/2017  Glucose 70 - 140 mg/dl 104 161(H) 132(H)  BUN 7.0 - 26.0 mg/dL 14.5 25.7 13  Creatinine  0.6 - 1.1 mg/dL 0.9 0.9 0.81  Sodium 136 - 145 mEq/L 138 140 137  Potassium 3.5 - 5.1 mEq/L 4.2 4.3 3.5  Chloride 101 - 111 mmol/L - - 108  CO2 22 - 29 mEq/L 21(L) 20(L) 23  Calcium 8.4 - 10.4 mg/dL 10.6(H) 10.2 9.1  Total Protein 6.4 - 8.3 g/dL 7.3 8.2 -  Total Bilirubin 0.20 - 1.20 mg/dL 0.60 0.42 -  Alkaline Phos 40 - 150 U/L 136 106 -  AST 5 - 34 U/L 37(H) 15 -  ALT 0 - 55 U/L 40 15 -   PATHOLOGY  Diagnosis 06/26/17  1. Breast, lumpectomy, Left - INVASIVE DUCTAL CARCINOMA, GRADE III/III, SPANNING 1.2 CM. - THE SURGICAL RESECTION MARGINS ARE NEGATIVE FOR CARCINOMA. - SEE ONCOLOGY TABLE BELOW. 2. Lymph node, sentinel, biopsy, Left axillary #1 - THERE IS NO EVIDENCE OF CARCINOMA IN 1 OF 1 LYMPH NODE (0/1). 3. Lymph node, sentinel, biopsy, Left axillary #2 - THERE IS NO EVIDENCE OF CARCINOMA IN 1 OF 1 LYMPH NODE (0/1). 4. Lymph node, sentinel, biopsy, Left axillary #3 - THERE IS NO EVIDENCE OF CARCINOMA IN 1 OF 1 LYMPH NODE (0/1). Microscopic Comment 1. BREAST, INVASIVE TUMOR Procedure: Seed localized lumpectomy. Laterality: Left. Tumor Size: 1.2 cm (gross measurement). Histologic Type: Ductal. Grade: III. Tubular Differentiation: 3. Nuclear Pleomorphism: 2. Mitotic  Count: 3. Ductal Carcinoma in Situ (DCIS): Not identified. Extent of Tumor: Confined to breast parenchyma. Margins: Greater than 0.2 cm to all margins. Regional Lymph Nodes: Number of Lymph Nodes Examined: 3. Number of Sentinel Lymph Nodes Examined: 3. Lymph Nodes with Macrometastases: 0. Lymph Nodes with Micrometastases: 0. Lymph Nodes with Isolated Tumor Cells: 0. Breast Prognostic Profile: Case [id number]. Estrogen Receptor: 0%. 1 of 3 FINAL for Yakubov, Arnetia ANN [phone]) Microscopic Comment(continued) Progesterone Receptor: 0%. Her2: No amplification was detected. Ki-67: 70%. Best tumor block for sendout testing: 1A or 1B. Pathologic Stage Classification (pTNM, AJCC 8th Edition): Primary Tumor (pT): pT1c. Regional Lymph Nodes (pN): pN0. Distant Metastases (pM): pMX.   Diagnosis 1. Breast, left, needle core biopsy, 5:30 o'clock - INVASIVE DUCTAL CARCINOMA - SEE COMMENT 2. Lymph node, needle/core biopsy, left axillary - NO CARCINOMA IDENTIFIED IN ONE LYMPH NODE (0/1) - SEE COMMENT Microscopic Comment 1. Based on the biopsy, the carcinoma appears Nottingham grade 3 of 3 and measures 1.2 cm in greatest linear extent. Prognostic markers (ER/PR/ki-67/HER2-FISH) are pending and will be reported in an addendum. Dr. Lyndon Code has reviewed the case and agrees with above diagnosis. These results were called to [facility] on June 10, 2017. 2. Deeper levels were examined. Cytokeratin AE1/3 is negative supporting the above diagnosis. 1. PROGNOSTIC INDICATORS Results: IMMUNOHISTOCHEMICAL AND MORPHOMETRIC ANALYSIS PERFORMED MANUALLY Estrogen Receptor: 0%, NEGATIVE Progesterone Receptor: 0%, NEGATIVE Proliferation Marker Ki67: 70%  ECHO 05/14/17  LV EF 45-50% Study Conclusions - Left ventricle: The cavity size was normal. There was mild   concentric hypertrophy. Systolic function was mildly reduced. The   estimated ejection fraction was in the  range of 45% to 50%.   Diffuse hypokinesis. Doppler parameters are consistent with   abnormal left ventricular relaxation (grade 1 diastolic   dysfunction). - Aorta: Aortic root dimension: 38 mm (ED). - Ascending aorta: The ascending aorta was mildly dilated.  RADIOGRAPHIC STUDIES: I have personally reviewed the radiological images as listed and agreed with the findings in the report. No results found.   ASSESSMENT & PLAN: Ziah Turvey is a 61 y.o. female with a history of  HTN, COPD, retinitis pigmentosa, and CHF with EF 45-50%, presented with screening discovered left breast cancer.   1. 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 Genetics 3. Chronic combined systolic and diastolic heart failure, EF 45-50% 4. HTN, COPD, retinitis pigmentosa with limited vision 5. Financial support 6. Diarrhea, dehydration 7. Mild hypercalcemia  Ms. Holzworth appears stable but with significant toxicity from cycle 1 TCH such as profound fatigue, diarrhea, mild nausea, body aches, and mild dehydration. We reviewed decadron premed regimen; she took 1 tablet TID which may have contributed to heartburn symptom. I recommend she try zantac; she is mildly hypercalcemic Ca 10.6 so I suggest she avoid tums. She will take decadron 2 tabs BID day before chemo, and 2 tabs BID day after x3 days. She will try zofran for nausea since compazine was not tolerated. If this does not help can consider adding Emend pre-med with next cycle. For diarrhea, I will give her 2 tabs imodium here in office and I recommend she take 1 tablet for each subsequent episode, up to 8 tablets max per day; she agrees. She will get 500 cc NS over 2 hours for diarrhea and mild dehydration. She has combined systolic and diastolic HF but is asymptomatic, last echo with mildly reduced EF; she does not require lasix currently. I told her to watch swelling  in legs or development of cough and report symptoms. For body aches I reminded her to take claritin x5 days after injection; I refilled oxycodone and naprosyn for her. CBC with expected leukocytosis secondary to neulasta; AST slightly elevated, 37, likely secondary to chemo; labs otherwise unremarkable. Will monitor closely. She will call if symptoms worsen or fail to improve. Return for f/u and cycle 2 in 2 weeks.   PLAN -Zantac for heartburn; avoid tums due to mild hypercalcemia -Reviewed decadron pre-med dosing -zofran for nausea; if not effective and heartburn worse with compazine can consider adding emend to chemo pre-med -Imodium PRN for diarrhea, will get 2 tabs here in office -500 cc NS over 2 hours today -claritin for body aches x5 days after next chemo -refilled oxycodone and naprosyn today -Return in 2 weeks for f/u and cycle 2 adjuvant TCH   All questions were answered. The patient knows to call the clinic with any problems, questions or concerns. No barriers to learning was detected.     Alla Feeling, NP 08/01/17

## 2017-08-01 NOTE — Patient Instructions (Signed)
Dehydration, Adult Dehydration is when there is not enough fluid or water in your body. This happens when you lose more fluids than you take in. Dehydration can range from mild to very bad. It should be treated right away to keep it from getting very bad. Symptoms of mild dehydration may include:  Thirst.  Dry lips.  Slightly dry mouth.  Dry, warm skin.  Dizziness. Symptoms of moderate dehydration may include:  Very dry mouth.  Muscle cramps.  Dark pee (urine). Pee may be the color of tea.  Your body making less pee.  Your eyes making fewer tears.  Heartbeat that is uneven or faster than normal (palpitations).  Headache.  Light-headedness, especially when you stand up from sitting.  Fainting (syncope). Symptoms of very bad dehydration may include:  Changes in skin, such as: ? Cold and clammy skin. ? Blotchy (mottled) or pale skin. ? Skin that does not quickly return to normal after being lightly pinched and let go (poor skin turgor).  Changes in body fluids, such as: ? Feeling very thirsty. ? Your eyes making fewer tears. ? Not sweating when body temperature is high, such as in hot weather. ? Your body making very little pee.  Changes in vital signs, such as: ? Weak pulse. ? Pulse that is more than 100 beats a minute when you are sitting still. ? Fast breathing. ? Low blood pressure.  Other changes, such as: ? Sunken eyes. ? Cold hands and feet. ? Confusion. ? Lack of energy (lethargy). ? Trouble waking up from sleep. ? Short-term weight loss. ? Unconsciousness. Follow these instructions at home:  If told by your doctor, drink an ORS: ? Make an ORS by using instructions on the package. ? Start by drinking small amounts, about  cup (120 mL) every 5-10 minutes. ? Slowly drink more until you have had the amount that your doctor said to have.  Drink enough clear fluid to keep your pee clear or pale yellow. If you were told to drink an ORS, finish the ORS  first, then start slowly drinking clear fluids. Drink fluids such as: ? Water. Do not drink only water by itself. Doing that can make the salt (sodium) level in your body get too low (hyponatremia). ? Ice chips. ? Fruit juice that you have added water to (diluted). ? Low-calorie sports drinks.  Avoid: ? Alcohol. ? Drinks that have a lot of sugar. These include high-calorie sports drinks, fruit juice that does not have water added, and soda. ? Caffeine. ? Foods that are greasy or have a lot of fat or sugar.  Take over-the-counter and prescription medicines only as told by your doctor.  Do not take salt tablets. Doing that can make the salt level in your body get too high (hypernatremia).  Eat foods that have minerals (electrolytes). Examples include bananas, oranges, potatoes, tomatoes, and spinach.  Keep all follow-up visits as told by your doctor. This is important. Contact a doctor if:  You have belly (abdominal) pain that: ? Gets worse. ? Stays in one area (localizes).  You have a rash.  You have a stiff neck.  You get angry or annoyed more easily than normal (irritability).  You are more sleepy than normal.  You have a harder time waking up than normal.  You feel: ? Weak. ? Dizzy. ? Very thirsty.  You have peed (urinated) only a small amount of very dark pee during 6-8 hours. Get help right away if:  You have symptoms of   very bad dehydration.  You cannot drink fluids without throwing up (vomiting).  Your symptoms get worse with treatment.  You have a fever.  You have a very bad headache.  You are throwing up or having watery poop (diarrhea) and it: ? Gets worse. ? Does not go away.  You have blood or something green (bile) in your throw-up.  You have blood in your poop (stool). This may cause poop to look black and tarry.  You have not peed in 6-8 hours.  You pass out (faint).  Your heart rate when you are sitting still is more than 100 beats a  minute.  You have trouble breathing. This information is not intended to replace advice given to you by your health care provider. Make sure you discuss any questions you have with your health care provider. Document Released: 05/25/2009 Document Revised: 02/16/2016 Document Reviewed: 09/22/2015 Elsevier Interactive Patient Education  2018 Elsevier Inc.  

## 2017-08-07 ENCOUNTER — Ambulatory Visit (INDEPENDENT_AMBULATORY_CARE_PROVIDER_SITE_OTHER): Payer: Medicare HMO | Admitting: Family Medicine

## 2017-08-07 ENCOUNTER — Encounter: Payer: Self-pay | Admitting: Family Medicine

## 2017-08-07 VITALS — BP 118/80 | HR 94 | Temp 98.6°F | Ht 67.0 in | Wt 238.2 lb

## 2017-08-07 DIAGNOSIS — H3552 Pigmentary retinal dystrophy: Secondary | ICD-10-CM

## 2017-08-07 DIAGNOSIS — H35353 Cystoid macular degeneration, bilateral: Secondary | ICD-10-CM | POA: Diagnosis not present

## 2017-08-07 DIAGNOSIS — H5789 Other specified disorders of eye and adnexa: Secondary | ICD-10-CM

## 2017-08-07 DIAGNOSIS — H16043 Marginal corneal ulcer, bilateral: Secondary | ICD-10-CM | POA: Diagnosis not present

## 2017-08-07 DIAGNOSIS — H547 Unspecified visual loss: Secondary | ICD-10-CM

## 2017-08-07 DIAGNOSIS — H5710 Ocular pain, unspecified eye: Secondary | ICD-10-CM

## 2017-08-07 DIAGNOSIS — H20013 Primary iridocyclitis, bilateral: Secondary | ICD-10-CM | POA: Diagnosis not present

## 2017-08-07 NOTE — Progress Notes (Signed)
   Subjective:    Patient ID: Kristin Ward, female    DOB: 1955-08-28, 61 y.o.   MRN: [id number]  HPI Chief Complaint  Patient presents with  . Advice Only    swollen eyes (both) started late Monday. can't open eyes at all.   She is a 61 year old female with a history of retinitis pigmentosa, blindness, and breast cancer who is here with complaints of a 4 day history bilateral blurry vision, red eyes, watery clear drainage, eye pain, and severe eye lid swelling.  States this is "a flare up of uveitis". States she has had similar symptoms in the past while living in [place] and was treated by her retina specialist.  She has not established with an eye specialist locally yet.  Currently undergoing chemotherapy for breast cancer.   States she contacted a retina specialist today and was told she needs a referral to be seen.   Denies fever, chills, dizziness, chest pain, palpitations, shortness of breath, abdominal pain, N/V/D, urinary symptoms.   Reviewed allergies, medications, past medical, surgical, and social history.    Review of Systems Pertinent positives and negatives in the history of present illness.     Objective:   Physical Exam  Constitutional: She appears well-developed and well-nourished. She does not have a sickly appearance. No distress.  HENT:  Nose: Nose normal.  Mouth/Throat: Uvula is midline, oropharynx is clear and moist and mucous membranes are normal.  Eyes: Pupils are equal, round, and reactive to light. Right conjunctiva is injected. Left conjunctiva is injected.  Bilateral upper and lower lids are swollen. Patient unable to open eyelids to see. Conjunctiva are injected bilaterally. Clear drainage bilaterally. Pain with EOMs.   Lymphadenopathy:    She has no cervical adenopathy.  Skin: Skin is warm and dry.   BP 118/80 (BP Location: Right Arm, Patient Position: Sitting)   Pulse 94   Temp 98.6 F (37 C) (Oral)   Ht 5\' 7"  (1.702 m)   Wt 238 lb 3.2 oz  (108 kg)   SpO2 96%   BMI 37.31 kg/m       Assessment & Plan:  Acute eye pain  Retinitis pigmentosa  Blind  Redness of both eyes  Eye swollen, bilateral  Called several offices to have her seen today for this problem by an ophthalmologist. She can be seen today at 3:45 pm at Dr. Zenia Resides office. Patient and daughter are aware of appointment.

## 2017-08-12 DIAGNOSIS — Z923 Personal history of irradiation: Secondary | ICD-10-CM

## 2017-08-12 HISTORY — DX: Personal history of irradiation: Z92.3

## 2017-08-13 DIAGNOSIS — H20013 Primary iridocyclitis, bilateral: Secondary | ICD-10-CM | POA: Diagnosis not present

## 2017-08-13 DIAGNOSIS — H35353 Cystoid macular degeneration, bilateral: Secondary | ICD-10-CM | POA: Diagnosis not present

## 2017-08-13 DIAGNOSIS — H16043 Marginal corneal ulcer, bilateral: Secondary | ICD-10-CM | POA: Diagnosis not present

## 2017-08-17 NOTE — Progress Notes (Signed)
[place]  Telephone:([phone] [phone]  Clinic Follow up Note   Patient Care Team: Girtha Rm, NP-C as PCP - General (Family Medicine) [name], MD as Consulting Physician (Hematology) [name], MD as Consulting Physician (General Surgery) 08/18/2017  CHIEF COMPLAINT: F/u left breast cancer, triple negative  SUMMARY OF ONCOLOGIC HISTORY: Oncology History   Cancer Staging Malignant neoplasm of lower-outer quadrant of left breast of female, estrogen receptor negative ([place]) Staging form: Breast, AJCC 8th Edition - Clinical stage from 06/06/2017: Stage IB (cT1c, cN0, cM0, G3, ER: Negative, PR: Negative, HER2: Negative) - Signed by [name], MD on 06/15/2017 - Pathologic stage from 06/26/2017: Stage IB (pT1c, pN0, cM0, G3, ER: Negative, PR: Negative, HER2: Negative) - Signed by [name], MD on 07/10/2017       Malignant neoplasm of lower-outer quadrant of left breast of female, estrogen receptor negative ([place])   05/30/2017 Mammogram    Diagnostic mammo and US IMPRESSION: 1. Highly suspicious 1.4 cm mass in the slightly lower slightly outer left breast -tissue sampling recommended. 2. Indeterminate 0.5 mm mass in the slightly lower slightly outer left breast -tissue sampling recommended. 3. At least 2 left axillary lymph nodes with borderline cortical thickness.      06/06/2017 Initial Biopsy    Diagnosis 1. Breast, left, needle core biopsy, 5:30 o'clock - INVASIVE DUCTAL CARCINOMA, G3 2. Lymph node, needle/core biopsy, left axillary - NO CARCINOMA IDENTIFIED IN ONE LYMPH NODE (0/1)      06/06/2017 Initial Diagnosis    Malignant neoplasm of lower-outer quadrant of left breast of female, estrogen receptor negative ([place])      06/06/2017 Receptors her2    Estrogen Receptor: 0%, NEGATIVE Progesterone Receptor: 0%, NEGATIVE Proliferation Marker Ki67: 70%      06/26/2017 Surgery    LEFT BREAST LUMPECTOMY WITH RADIOACTIVE SEED  AND SENTINEL LYMPH NODE BIOPSY ERAS  PATHWAY AND INSERTION PORT-A-CATH By Dr. Barry Dienes on 06/26/17       06/26/2017 Pathology Results    Diagnosis 06/26/17  1. Breast, lumpectomy, Left - INVASIVE DUCTAL CARCINOMA, GRADE III/III, SPANNING 1.2 CM. - THE SURGICAL RESECTION MARGINS ARE NEGATIVE FOR CARCINOMA. - SEE ONCOLOGY TABLE BELOW. 2. Lymph node, sentinel, biopsy, Left axillary #1 - THERE IS NO EVIDENCE OF CARCINOMA IN 1 OF 1 LYMPH NODE (0/1). 3. Lymph node, sentinel, biopsy, Left axillary #2 - THERE IS NO EVIDENCE OF CARCINOMA IN 1 OF 1 LYMPH NODE (0/1). 4. Lymph node, sentinel, biopsy, Left axillary #3 - THERE IS NO EVIDENCE OF CARCINOMA IN 1 OF 1 LYMPH NODE (0/1).       07/25/2017 -  Chemotherapy    CT every 3 weekd for 4 cycles plan to start on 07/25/17       CURRENT THERAPY: adjuvant chemo TC every 3 weeks for 4 cycles starting 07/25/17  INTERVAL HISTORY: Ms. Ellender returns for follow up as scheduled prior to cycle 2 TC with neulasta. She has recovered well after receiving IVF and using imodium for diarrhea following cycle 1. On 12/23 she was in a dusty room, on 12/24 developed eye swelling and on 12/25 she became totally blind. She was sent to ophthalmology on 12/27 who diagnosed her with uveitis flare and started occular ointment, drops, oral antibiotics and oral prednisone 40 mg daily. She has had uveitis flares in the past. She is able to see some light and shadows. She notes visual disturbances when she takes oxycodone so she avoids this med when possible and prefers naprosyn.  Denies lightheadedness, dizziness, or fall. Her fatigue and appetite are improved. Mild intermittent nausea secondary to reflux when on steroids; pepcid helps. She was constipated after using imodium 2 weeks ago but adjusted her diet and BM now regular. Denies cough, dyspnea, palpitations, leg swelling, peripheral numbness or tingling.   REVIEW OF SYSTEMS:   Constitutional: Denies fevers, chills, abnormal  weight loss (+) fatigue improved (+) good appetite  Eyes: Denies blurriness of vision (+) blindness secondary to uveitis flare Ears, nose, mouth, throat, and face: Denies mucositis or sore throat   Respiratory: Denies cough, dyspnea or wheezes Cardiovascular: Denies palpitation, chest discomfort or lower extremity swelling Gastrointestinal:  Denies emesis, constipation, diarrhea, blood in stool (+) intermittent mild nausea (+) heartburn while on steroids, improved with pepcid Skin: Denies abnormal skin rashes Lymphatics: Denies new lymphadenopathy or easy bruising Neurological:Denies numbness, tingling or new weaknesses (+) intermittent lightheadedness, dizziness; no fall  Behavioral/Psych: Mood is stable, no new changes  MSK: (+) mild-mod bone pain days 5-6 after neulasta All other systems were reviewed with the patient and are negative.  MEDICAL HISTORY:  Past Medical History:  Diagnosis Date  . Abnormal myocardial perfusion study 03/15/2016   EF 15%, severe LV systolic dysfcn, global hypokinesis and inferior akinesis. dilated LV  . Abnormal x-ray of lungs with single pulmonary nodule 03/15/2016   per records from [place]   . Blind   . Cholelithiasis 05/19/2017   On CT  . COPD (chronic obstructive pulmonary disease) ([place])    PT DENIES  . Coronary artery calcification seen on CAT scan 05/19/2017  . Dilated idiopathic cardiomyopathy ([place]) 12/2015   EF 15-20%. Diagnosed in [facility]  . Family history of colon cancer   . Genetic testing 06/19/2017   Multi-Cancer panel (83 genes) @ [facility] - No pathogenic mutations detected  . Headache    NONE RECENT  . Hypertension   . Malignant neoplasm of lower-outer quadrant of left breast of female, estrogen receptor negative ([place]) 06/11/2017  . Retinitis pigmentosa   . Solitary pulmonary nodule on lung CT 03/16/2016   per records from [place]  . Uveitis     SURGICAL HISTORY: Past Surgical History:  Procedure  Laterality Date  . brain cyst removed  2007   to help with headaches per patient , aspirated   . BREAST LUMPECTOMY WITH RADIOACTIVE SEED AND SENTINEL LYMPH NODE BIOPSY Left 06/26/2017   Procedure: BREAST LUMPECTOMY WITH RADIOACTIVE SEED AND SENTINEL LYMPH NODE BIOPSY ERAS  PATHWAY;  Surgeon: [name], MD;  Location: [place];  Service: General;  Laterality: Left;  pec block  . NASAL TURBINATE REDUCTION    . PORTACATH PLACEMENT N/A 06/26/2017   Procedure: INSERTION PORT-A-CATH;  Surgeon: [name], MD;  Location: [place];  Service: General;  Laterality: N/A;  . TRANSTHORACIC ECHOCARDIOGRAM  12/2015   [facility] May 2017: Mild concentric LVH. Global hypokinesis. GR daily. EF 18% severe LA dilation. Mitral annular dilatation with papillary muscle dysfunction and moderate MR. Dilated IVC consistent with elevated RAP.    I have reviewed the social history and family history with the patient and they are unchanged from previous note.  ALLERGIES:  is allergic to latex; tylenol with codeine #3 [acetaminophen-codeine]; and penicillins.  MEDICATIONS:  Current Outpatient Medications  Medication Sig Dispense Refill  . amLODipine (NORVASC) 5 MG tablet Take 1 tablet (5 mg total) by mouth daily. 30 tablet 5  . aspirin 81 MG chewable tablet Chew 81 mg by mouth daily.    . carvedilol (COREG)  12.5 MG tablet Take 1 tablet (12.5 mg total) by mouth 2 (two) times daily with a meal. 60 tablet 5  . dexamethasone (DECADRON) 4 MG tablet Take 2 tablets (8 mg total) by mouth 2 (two) times daily. Start the day before Taxotere. Then again the day after chemo for 3 days. 30 tablet 1  . doxycycline (VIBRA-TABS) 100 MG tablet Take 100 mg by mouth 2 (two) times daily.  0  . furosemide (LASIX) 40 MG tablet Take 1 tablet (40 mg total) by mouth daily. 30 tablet 5  . lidocaine-prilocaine (EMLA) cream Apply to affected area once 30 g 3  . naproxen (NAPROSYN) 500 MG tablet Take 1 tablet (500 mg total) by mouth daily  as needed for mild pain. 20 tablet 1  . ofloxacin (OCUFLOX) 0.3 % ophthalmic solution INSTILL 1 DROP INTO BOTH EYES 3 TIMES A DAY  0  . predniSONE (DELTASONE) 20 MG tablet Take 40 mg by mouth daily.  0  . sacubitril-valsartan (ENTRESTO) 24-26 MG Take 1 tablet by mouth 2 (two) times daily. 60 tablet 5  . spironolactone (ALDACTONE) 25 MG tablet Take 1 tablet (25 mg total) by mouth daily. (Patient taking differently: Take 25 mg every evening by mouth. ) 30 tablet 5  . valACYclovir (VALTREX) 1000 MG tablet Take 1 tablet (1,000 mg total) by mouth daily. 7 tablet 6  . vitamin C (ASCORBIC ACID) 500 MG tablet Take 1,000 mg 2 (two) times daily by mouth.    . ondansetron (ZOFRAN) 8 MG tablet Take 1 tablet (8 mg total) by mouth 2 (two) times daily as needed for refractory nausea / vomiting. Start on day 3 after chemo. (Patient not taking: Reported on 08/18/2017) 30 tablet 1  . oxyCODONE (OXY IR/ROXICODONE) 5 MG immediate release tablet Take 1-2 tablets (5-10 mg total) by mouth every 6 (six) hours as needed for moderate pain, severe pain or breakthrough pain. (Patient not taking: Reported on 08/18/2017) 20 tablet 0  . prochlorperazine (COMPAZINE) 10 MG tablet Take 1 tablet (10 mg total) by mouth every 6 (six) hours as needed (Nausea or vomiting). (Patient not taking: Reported on 08/18/2017) 30 tablet 1   No current facility-administered medications for this visit.    Facility-Administered Medications Ordered in Other Visits  Medication Dose Route Frequency Provider Last Rate Last Dose  . sodium chloride flush (NS) 0.9 % injection 10 mL  10 mL Intracatheter PRN [name], MD   10 mL at 08/18/17 1524    PHYSICAL EXAMINATION: ECOG PERFORMANCE STATUS: 2 - Symptomatic, <50% confined to bed  Vitals:   08/18/17 1041  BP: 131/74  Pulse: 76  Resp: 20  Temp: 98.3 F (36.8 C)  SpO2: 100%   Filed Weights   08/18/17 1041  Weight: 237 lb 1.6 oz (107.5 kg)    GENERAL:alert, no distress and comfortable; side-lying  on exam table SKIN: skin color, texture, turgor are normal, no rashes or significant lesions EYES: normal, conjunctiva are pink and non-injected, sclera clear OROPHARYNX:no exudate, no erythema and lips, buccal mucosa, and tongue normal  NECK: supple, thyroid normal size, non-tender, without nodularity LYMPH:  no palpable cervical, supraclavicular, or axillary lymphadenopathy LUNGS: clear to auscultation bilaterally with normal breathing effort HEART: regular rate & rhythm and no murmurs and no lower extremity edema ABDOMEN:abdomen soft, nontender and normal bowel sounds  Musculoskeletal:no cyanosis of digits and no clubbing  NEURO: alert & oriented x 3 with fluent speech, no focal motor/sensory deficits BREASTS: (+) s/p left lumpectomy; surgical incision in  left breast and axilla are well healed; no palpable mass that I could appreciate; right breast benign  PAC without erythema  LABORATORY DATA:  I have reviewed the data as listed CBC Latest Ref Rng & Units 08/18/2017 08/01/2017 07/25/2017  WBC 3.9 - 10.3 K/uL 22.1(H) 21.0(H) 14.0(H)  Hemoglobin 11.6 - 15.9 g/dL 11.0(L) 12.5 12.4  Hematocrit 34.8 - 46.6 % 34.0(L) 37.6 37.5  Platelets 145 - 400 K/uL 774(H) 253 324     CMP Latest Ref Rng & Units 08/18/2017 08/01/2017 07/25/2017  Glucose 70 - 140 mg/dL 156(H) 104 161(H)  BUN 7 - 26 mg/dL 18 14.5 25.7  Creatinine 0.60 - 1.10 mg/dL 0.85 0.9 0.9  Sodium 136 - 145 mmol/L 140 138 140  Potassium 3.3 - 4.7 mmol/L 4.4 4.2 4.3  Chloride 98 - 109 mmol/L 107 - -  CO2 22 - 29 mmol/L 26 21(L) 20(L)  Calcium 8.4 - 10.4 mg/dL 10.3 10.6(H) 10.2  Total Protein 6.4 - 8.3 g/dL 7.6 7.3 8.2  Total Bilirubin 0.2 - 1.2 mg/dL 0.5 0.60 0.42  Alkaline Phos 40 - 150 U/L 108 136 106  AST 5 - 34 U/L 14 37(H) 15  ALT 0 - 55 U/L 35 40 15   PATHOLOGY  Diagnosis 06/26/17  1. Breast, lumpectomy, Left - INVASIVE DUCTAL CARCINOMA, GRADE III/III, SPANNING 1.2 CM. - THE SURGICAL RESECTION MARGINS ARE NEGATIVE FOR  CARCINOMA. - SEE ONCOLOGY TABLE BELOW. 2. Lymph node, sentinel, biopsy, Left axillary #1 - THERE IS NO EVIDENCE OF CARCINOMA IN 1 OF 1 LYMPH NODE (0/1). 3. Lymph node, sentinel, biopsy, Left axillary #2 - THERE IS NO EVIDENCE OF CARCINOMA IN 1 OF 1 LYMPH NODE (0/1). 4. Lymph node, sentinel, biopsy, Left axillary #3 - THERE IS NO EVIDENCE OF CARCINOMA IN 1 OF 1 LYMPH NODE (0/1). Microscopic Comment 1. BREAST, INVASIVE TUMOR Procedure: Seed localized lumpectomy. Laterality: Left. Tumor Size: 1.2 cm (gross measurement). Histologic Type: Ductal. Grade: III. Tubular Differentiation: 3. Nuclear Pleomorphism: 2. Mitotic Count: 3. Ductal Carcinoma in Situ (DCIS): Not identified. Extent of Tumor: Confined to breast parenchyma. Margins: Greater than 0.2 cm to all margins. Regional Lymph Nodes: Number of Lymph Nodes Examined: 3. Number of Sentinel Lymph Nodes Examined: 3. Lymph Nodes with Macrometastases: 0. Lymph Nodes with Micrometastases: 0. Lymph Nodes with Isolated Tumor Cells: 0. Breast Prognostic Profile: Case [id number]. Estrogen Receptor: 0%. 1 of 3 FINAL for Luck[place], [place] ANN [phone]) Microscopic Comment(continued) Progesterone Receptor: 0%. Her2: No amplification was detected. Ki-67: 70%. Best tumor block for sendout testing: 1A or 1B. Pathologic Stage Classification (pTNM, AJCC 8th Edition): Primary Tumor (pT): pT1c. Regional Lymph Nodes (pN): pN0. Distant Metastases (pM): pMX.   Diagnosis 1. Breast, left, needle core biopsy, 5:30 o'clock - INVASIVE DUCTAL CARCINOMA - SEE COMMENT 2. Lymph node, needle/core biopsy, left axillary - NO CARCINOMA IDENTIFIED IN ONE LYMPH NODE (0/1) - SEE COMMENT Microscopic Comment 1. Based on the biopsy, the carcinoma appears Nottingham grade 3 of 3 and measures 1.2 cm in greatest linear extent. Prognostic markers (ER/PR/ki-67/HER2-FISH) are pending and will be reported in an addendum. Dr. Lyndon Code has reviewed the case and  agrees with above diagnosis. These results were called to [facility] on June 10, 2017. 2. Deeper levels were examined. Cytokeratin AE1/3 is negative supporting the above diagnosis. 1. PROGNOSTIC INDICATORS Results: IMMUNOHISTOCHEMICAL AND MORPHOMETRIC ANALYSIS PERFORMED MANUALLY Estrogen Receptor: 0%, NEGATIVE Progesterone Receptor: 0%, NEGATIVE Proliferation Marker Ki67: 70%  ECHO 05/14/17  LV EF 45-50% Study Conclusions - Left  ventricle: The cavity size was normal. There was mild   concentric hypertrophy. Systolic function was mildly reduced. The   estimated ejection fraction was in the range of 45% to 50%.   Diffuse hypokinesis. Doppler parameters are consistent with   abnormal left ventricular relaxation (grade 1 diastolic   dysfunction). - Aorta: Aortic root dimension: 38 mm (ED). - Ascending aorta: The ascending aorta was mildly dilated.  RADIOGRAPHIC STUDIES: I have personally reviewed the radiological images as listed and agreed with the findings in the report. No results found.   ASSESSMENT & PLAN: Kristin Ward is a 62 y.o. female with a history of HTN, COPD, retinitis pigmentosa, and CHF with EF 45-50%, presented with screening discovered left breast cancer.   1. 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 Genetics 3. Chronic combined systolic and diastolic heart failure, EF 45-50% 4. HTN, COPD, retinitis pigmentosa with limited vision 5. Financial support 6. Diarrhea, dehydration 7. Mild hypercalcemia 8. Uveitis   Ms. Mele appears stable today, she had fatigue, mild dehydration, and GI toxicity with nausea and diarrhea for a week after chemo requiring IVF but has recovered well. She had uveitis flare and lost her vision on 12/24, currently on oral prednisone 40 mg daily, ofloxacin drops, and doxycycline per opthalmology; has f/u with  ophthalmologist 1/9. Taxotere can cause ocular changes, but she has had uveitis flare in the past. Will monitor closely and continue chemo as planned.   We reviewed anti-emetics, anti-diarrheal, hydration, and claritin for bone pain. Labs reviewed, CBC with marked leukocytosis secondary to steroid use. Cmet unremarkable except mild hyperglycemia, also related to steroids. She is ready to proceed with cycle 2 TC with neulasta. Will add emend due to prolonged nausea with cycle 1. She is on 40 mg daily prednisone for uveitis, she can hold decadron x3 days after chemo with this cycle. She will return in 1 week for symptom check and in 3 weeks for f/u with Dr. Burr Medico and cycle 3.    PLAN -On treatment for uveitis per Ophthalmologist, next f/u 1/9  -Hold decadron x3 days after chemo with cycle 2 (on prednisone for uveitis) -Add Emend for nausea from cycle 2 -labs reviewed, proceed with cycle 2 TC with neulasta today -Return in 1 week for NP f/u and symptom check -F/u with Dr. Burr Medico in 3 weeks and cycle 3   All questions were answered. The patient knows to call the clinic with any problems, questions or concerns. No barriers to learning was detected.     Alla Feeling, NP 08/18/17

## 2017-08-18 ENCOUNTER — Inpatient Hospital Stay: Payer: Medicare HMO | Attending: Nurse Practitioner | Admitting: Nurse Practitioner

## 2017-08-18 ENCOUNTER — Inpatient Hospital Stay: Payer: Medicare HMO

## 2017-08-18 ENCOUNTER — Encounter: Payer: Self-pay | Admitting: Nurse Practitioner

## 2017-08-18 ENCOUNTER — Telehealth: Payer: Self-pay | Admitting: Nurse Practitioner

## 2017-08-18 VITALS — BP 131/74 | HR 76 | Temp 98.3°F | Resp 20 | Ht 67.0 in | Wt 237.1 lb

## 2017-08-18 DIAGNOSIS — I5042 Chronic combined systolic (congestive) and diastolic (congestive) heart failure: Secondary | ICD-10-CM

## 2017-08-18 DIAGNOSIS — I110 Hypertensive heart disease with heart failure: Secondary | ICD-10-CM

## 2017-08-18 DIAGNOSIS — Z5111 Encounter for antineoplastic chemotherapy: Secondary | ICD-10-CM | POA: Diagnosis not present

## 2017-08-18 DIAGNOSIS — C50512 Malignant neoplasm of lower-outer quadrant of left female breast: Secondary | ICD-10-CM

## 2017-08-18 DIAGNOSIS — E860 Dehydration: Secondary | ICD-10-CM

## 2017-08-18 DIAGNOSIS — R197 Diarrhea, unspecified: Secondary | ICD-10-CM | POA: Diagnosis not present

## 2017-08-18 DIAGNOSIS — J449 Chronic obstructive pulmonary disease, unspecified: Secondary | ICD-10-CM

## 2017-08-18 DIAGNOSIS — R5383 Other fatigue: Secondary | ICD-10-CM

## 2017-08-18 DIAGNOSIS — Z171 Estrogen receptor negative status [ER-]: Secondary | ICD-10-CM

## 2017-08-18 DIAGNOSIS — Z5189 Encounter for other specified aftercare: Secondary | ICD-10-CM | POA: Insufficient documentation

## 2017-08-18 LAB — COMPREHENSIVE METABOLIC PANEL
ALBUMIN: 3.9 g/dL (ref 3.5–5.0)
ALT: 35 U/L (ref 0–55)
AST: 14 U/L (ref 5–34)
Alkaline Phosphatase: 108 U/L (ref 40–150)
Anion gap: 7 (ref 3–11)
BUN: 18 mg/dL (ref 7–26)
CHLORIDE: 107 mmol/L (ref 98–109)
CO2: 26 mmol/L (ref 22–29)
Calcium: 10.3 mg/dL (ref 8.4–10.4)
Creatinine, Ser: 0.85 mg/dL (ref 0.60–1.10)
GFR calc Af Amer: >60 mL/min (ref >60)
GFR calc non Af Amer: >60 mL/min (ref >60)
GLUCOSE: 156 mg/dL — AB (ref 70–140)
POTASSIUM: 4.4 mmol/L (ref 3.3–4.7)
Sodium: 140 mmol/L (ref 136–145)
Total Bilirubin: 0.5 mg/dL (ref 0.2–1.2)
Total Protein: 7.6 g/dL (ref 6.4–8.3)

## 2017-08-18 LAB — CBC WITH DIFFERENTIAL/PLATELET
ABS GRANULOCYTE: 18.8 10*3/uL — AB (ref 1.5–6.5)
BASOS ABS: 0 10*3/uL (ref 0.0–0.1)
Basophils Relative: 0 %
Eosinophils Absolute: 0 10*3/uL (ref 0.0–0.5)
Eosinophils Relative: 0 %
HCT: 34 % — ABNORMAL LOW (ref 34.8–46.6)
Hemoglobin: 11 g/dL — ABNORMAL LOW (ref 11.6–15.9)
LYMPHS PCT: 8 %
Lymphs Abs: 1.8 10*3/uL (ref 0.9–3.3)
MCH: 30.1 pg (ref 25.1–34.0)
MCHC: 32.4 g/dL (ref 31.5–36.0)
MCV: 92.9 fL (ref 79.5–101.0)
MONO ABS: 1.5 10*3/uL — AB (ref 0.1–0.9)
MONOS PCT: 7 %
Neutro Abs: 18.8 10*3/uL — ABNORMAL HIGH (ref 1.5–6.5)
Neutrophils Relative %: 85 %
PLATELETS: 774 10*3/uL — AB (ref 145–400)
RBC: 3.66 MIL/uL — AB (ref 3.70–5.45)
RDW: 15.5 % (ref 11.2–16.1)
WBC: 22.1 10*3/uL — ABNORMAL HIGH (ref 3.9–10.3)

## 2017-08-18 MED ORDER — SODIUM CHLORIDE 0.9 % IV SOLN
75.0000 mg/m2 | Freq: Once | INTRAVENOUS | Status: AC
  Administered 2017-08-18: 170 mg via INTRAVENOUS
  Filled 2017-08-18: qty 17

## 2017-08-18 MED ORDER — SODIUM CHLORIDE 0.9 % IV SOLN
600.0000 mg/m2 | Freq: Once | INTRAVENOUS | Status: AC
  Administered 2017-08-18: 1380 mg via INTRAVENOUS
  Filled 2017-08-18: qty 69

## 2017-08-18 MED ORDER — SODIUM CHLORIDE 0.9% FLUSH
10.0000 mL | INTRAVENOUS | Status: DC | PRN
  Administered 2017-08-18: 10 mL
  Filled 2017-08-18: qty 10

## 2017-08-18 MED ORDER — PEGFILGRASTIM 6 MG/0.6ML ~~LOC~~ PSKT
PREFILLED_SYRINGE | SUBCUTANEOUS | Status: AC
  Filled 2017-08-18: qty 0.6

## 2017-08-18 MED ORDER — SODIUM CHLORIDE 0.9 % IV SOLN
Freq: Once | INTRAVENOUS | Status: AC
  Administered 2017-08-18: 12:00:00 via INTRAVENOUS

## 2017-08-18 MED ORDER — PEGFILGRASTIM 6 MG/0.6ML ~~LOC~~ PSKT
6.0000 mg | PREFILLED_SYRINGE | Freq: Once | SUBCUTANEOUS | Status: AC
  Administered 2017-08-18: 6 mg via SUBCUTANEOUS

## 2017-08-18 MED ORDER — HEPARIN SOD (PORK) LOCK FLUSH 100 UNIT/ML IV SOLN
500.0000 [IU] | Freq: Once | INTRAVENOUS | Status: AC | PRN
  Administered 2017-08-18: 500 [IU]
  Filled 2017-08-18: qty 5

## 2017-08-18 MED ORDER — PALONOSETRON HCL INJECTION 0.25 MG/5ML
0.2500 mg | Freq: Once | INTRAVENOUS | Status: AC
  Administered 2017-08-18: 0.25 mg via INTRAVENOUS

## 2017-08-18 MED ORDER — SODIUM CHLORIDE 0.9 % IV SOLN
Freq: Once | INTRAVENOUS | Status: AC
  Administered 2017-08-18: 13:00:00 via INTRAVENOUS
  Filled 2017-08-18: qty 5

## 2017-08-18 NOTE — Patient Instructions (Signed)
Implanted Port Home Guide An implanted port is a type of central line that is placed under the skin. Central lines are used to provide IV access when treatment or nutrition needs to be given through a person's veins. Implanted ports are used for long-term IV access. An implanted port may be placed because:  You need IV medicine that would be irritating to the small veins in your hands or arms.  You need long-term IV medicines, such as antibiotics.  You need IV nutrition for a long period.  You need frequent blood draws for lab tests.  You need dialysis.  Implanted ports are usually placed in the chest area, but they can also be placed in the upper arm, the abdomen, or the leg. An implanted port has two main parts:  Reservoir. The reservoir is round and will appear as a small, raised area under your skin. The reservoir is the part where a needle is inserted to give medicines or draw blood.  Catheter. The catheter is a thin, flexible tube that extends from the reservoir. The catheter is placed into a large vein. Medicine that is inserted into the reservoir goes into the catheter and then into the vein.  How will I care for my incision site? Do not get the incision site wet. Bathe or shower as directed by your health care provider. How is my port accessed? Special steps must be taken to access the port:  Before the port is accessed, a numbing cream can be placed on the skin. This helps numb the skin over the port site.  Your health care provider uses a sterile technique to access the port. ? Your health care provider must put on a mask and sterile gloves. ? The skin over your port is cleaned carefully with an antiseptic and allowed to dry. ? The port is gently pinched between sterile gloves, and a needle is inserted into the port.  Only "non-coring" port needles should be used to access the port. Once the port is accessed, a blood return should be checked. This helps ensure that the port  is in the vein and is not clogged.  If your port needs to remain accessed for a constant infusion, a clear (transparent) bandage will be placed over the needle site. The bandage and needle will need to be changed every week, or as directed by your health care provider.  Keep the bandage covering the needle clean and dry. Do not get it wet. Follow your health care provider's instructions on how to take a shower or bath while the port is accessed.  If your port does not need to stay accessed, no bandage is needed over the port.  What is flushing? Flushing helps keep the port from getting clogged. Follow your health care provider's instructions on how and when to flush the port. Ports are usually flushed with saline solution or a medicine called heparin. The need for flushing will depend on how the port is used.  If the port is used for intermittent medicines or blood draws, the port will need to be flushed: ? After medicines have been given. ? After blood has been drawn. ? As part of routine maintenance.  If a constant infusion is running, the port may not need to be flushed.  How long will my port stay implanted? The port can stay in for as long as your health care provider thinks it is needed. When it is time for the port to come out, surgery will be   done to remove it. The procedure is similar to the one performed when the port was put in. When should I seek immediate medical care? When you have an implanted port, you should seek immediate medical care if:  You notice a bad smell coming from the incision site.  You have swelling, redness, or drainage at the incision site.  You have more swelling or pain at the port site or the surrounding area.  You have a fever that is not controlled with medicine.  This information is not intended to replace advice given to you by your health care provider. Make sure you discuss any questions you have with your health care provider. Document  Released: 07/29/2005 Document Revised: 01/04/2016 Document Reviewed: 04/05/2013 Elsevier Interactive Patient Education  2017 [employer]vier Inc.  

## 2017-08-18 NOTE — Patient Instructions (Signed)
[facility] Discharge Instructions for Patients Receiving Chemotherapy  Today you received the following chemotherapy agents Taxotere and Cytoxan  To help prevent nausea and vomiting after your treatment, we encourage you to take your nausea medication as directed.    If you develop nausea and vomiting that is not controlled by your nausea medication, call the clinic.   BELOW ARE SYMPTOMS THAT SHOULD BE REPORTED IMMEDIATELY:  *FEVER GREATER THAN 100.5 F  *CHILLS WITH OR WITHOUT FEVER  NAUSEA AND VOMITING THAT IS NOT CONTROLLED WITH YOUR NAUSEA MEDICATION  *UNUSUAL SHORTNESS OF BREATH  *UNUSUAL BRUISING OR BLEEDING  TENDERNESS IN MOUTH AND THROAT WITH OR WITHOUT PRESENCE OF ULCERS  *URINARY PROBLEMS  *BOWEL PROBLEMS  UNUSUAL RASH Items with * indicate a potential emergency and should be followed up as soon as possible.  Feel free to call the clinic should you have any questions or concerns. The clinic phone number is [phone].  Please show the CHEMO ALERT CARD at check-in to the Emergency Department and triage nurse.   

## 2017-08-18 NOTE — Telephone Encounter (Signed)
Gave avs and calendar for January and february °

## 2017-08-20 DIAGNOSIS — H35353 Cystoid macular degeneration, bilateral: Secondary | ICD-10-CM | POA: Diagnosis not present

## 2017-08-20 DIAGNOSIS — H16043 Marginal corneal ulcer, bilateral: Secondary | ICD-10-CM | POA: Diagnosis not present

## 2017-08-20 DIAGNOSIS — H20013 Primary iridocyclitis, bilateral: Secondary | ICD-10-CM | POA: Diagnosis not present

## 2017-08-21 ENCOUNTER — Other Ambulatory Visit: Payer: Self-pay | Admitting: *Deleted

## 2017-08-21 DIAGNOSIS — R52 Pain, unspecified: Secondary | ICD-10-CM

## 2017-08-21 MED ORDER — NAPROXEN 500 MG PO TABS: 500.0000 mg | ORAL_TABLET | Freq: Every day | ORAL | 1 refills | Status: DC | PRN

## 2017-08-24 NOTE — Progress Notes (Deleted)
[place]  [phone] [phone]  Clinic Follow up Note   Patient Care Team: Girtha Rm, NP-C as PCP - General (Family Medicine) [name], MD as Consulting Physician (Hematology) [name], MD as Consulting Physician (General Surgery) 08/24/2017  CHIEF COMPLAINT: F/u left breast cancer, triple negative  SUMMARY OF ONCOLOGIC HISTORY: Oncology History   Cancer Staging Malignant neoplasm of lower-outer quadrant of left breast of female, estrogen receptor negative ([place]) Staging form: Breast, AJCC 8th Edition - Clinical stage from 06/06/2017: Stage IB (cT1c, cN0, cM0, G3, ER: Negative, PR: Negative, HER2: Negative) - Signed by [name], MD on 06/15/2017 - Pathologic stage from 06/26/2017: Stage IB (pT1c, pN0, cM0, G3, ER: Negative, PR: Negative, HER2: Negative) - Signed by [name], MD on 07/10/2017       Malignant neoplasm of lower-outer quadrant of left breast of female, estrogen receptor negative ([place])   05/30/2017 Mammogram    Diagnostic mammo and US IMPRESSION: 1. Highly suspicious 1.4 cm mass in the slightly lower slightly outer left breast -tissue sampling recommended. 2. Indeterminate 0.5 mm mass in the slightly lower slightly outer left breast -tissue sampling recommended. 3. At least 2 left axillary lymph nodes with borderline cortical thickness.      06/06/2017 Initial Biopsy    Diagnosis 1. Breast, left, needle core biopsy, 5:30 o'clock - INVASIVE DUCTAL CARCINOMA, G3 2. Lymph node, needle/core biopsy, left axillary - NO CARCINOMA IDENTIFIED IN ONE LYMPH NODE (0/1)      06/06/2017 Initial Diagnosis    Malignant neoplasm of lower-outer quadrant of left breast of female, estrogen receptor negative ([place])      06/06/2017 Receptors her2    Estrogen Receptor: 0%, NEGATIVE Progesterone Receptor: 0%, NEGATIVE Proliferation Marker Ki67: 70%      06/26/2017 Surgery    LEFT BREAST LUMPECTOMY WITH RADIOACTIVE SEED  AND SENTINEL LYMPH NODE BIOPSY ERAS  PATHWAY AND INSERTION PORT-A-CATH By Dr. Barry Dienes on 06/26/17       06/26/2017 Pathology Results    Diagnosis 06/26/17  1. Breast, lumpectomy, Left - INVASIVE DUCTAL CARCINOMA, GRADE III/III, SPANNING 1.2 CM. - THE SURGICAL RESECTION MARGINS ARE NEGATIVE FOR CARCINOMA. - SEE ONCOLOGY TABLE BELOW. 2. Lymph node, sentinel, biopsy, Left axillary #1 - THERE IS NO EVIDENCE OF CARCINOMA IN 1 OF 1 LYMPH NODE (0/1). 3. Lymph node, sentinel, biopsy, Left axillary #2 - THERE IS NO EVIDENCE OF CARCINOMA IN 1 OF 1 LYMPH NODE (0/1). 4. Lymph node, sentinel, biopsy, Left axillary #3 - THERE IS NO EVIDENCE OF CARCINOMA IN 1 OF 1 LYMPH NODE (0/1).       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 Ears, nose, mouth, throat, and face: Denies mucositis or sore throat   Respiratory: Denies cough, dyspnea or wheezes Cardiovascular: Denies palpitation, chest discomfort or lower extremity swelling Gastrointestinal:  Denies emesis, constipation, diarrhea, blood in stool (+) intermittent mild nausea (+) heartburn while on steroids, improved with pepcid Skin: Denies abnormal skin rashes Lymphatics: Denies new lymphadenopathy or easy bruising Neurological:Denies numbness, tingling or new weaknesses (+) intermittent lightheadedness, dizziness; no fall  Behavioral/Psych: Mood is stable, no  new changes  MSK: (+) mild-mod bone pain days 5-6 after neulasta All other systems were reviewed with the patient and are negative.  MEDICAL HISTORY:  Past Medical History:  Diagnosis Date  . Abnormal myocardial perfusion study 03/15/2016    EF 15%, severe LV systolic dysfcn, global hypokinesis and inferior akinesis. dilated LV  . Abnormal x-ray of lungs with single pulmonary nodule 03/15/2016   per records from [place]   . Blind   . Cholelithiasis 05/19/2017   On CT  . COPD (chronic obstructive pulmonary disease) ([place])    PT DENIES  . Coronary artery calcification seen on CAT scan 05/19/2017  . Dilated idiopathic cardiomyopathy ([place]) 12/2015   EF 15-20%. Diagnosed in [facility]  . Family history of colon cancer   . Genetic testing 06/19/2017   Multi-Cancer panel (83 genes) @ [facility] - No pathogenic mutations detected  . Headache    NONE RECENT  . Hypertension   . Malignant neoplasm of lower-outer quadrant of left breast of female, estrogen receptor negative ([place]) 06/11/2017  . Retinitis pigmentosa   . Solitary pulmonary nodule on lung CT 03/16/2016   per records from [place]  . Uveitis     SURGICAL HISTORY: Past Surgical History:  Procedure Laterality Date  . brain cyst removed  2007   to help with headaches per patient , aspirated   . BREAST LUMPECTOMY WITH RADIOACTIVE SEED AND SENTINEL LYMPH NODE BIOPSY Left 06/26/2017   Procedure: BREAST LUMPECTOMY WITH RADIOACTIVE SEED AND SENTINEL LYMPH NODE BIOPSY ERAS  PATHWAY;  Surgeon: [name], MD;  Location: [facility];  Service: General;  Laterality: Left;  pec block  . NASAL TURBINATE REDUCTION    . PORTACATH PLACEMENT N/A 06/26/2017   Procedure: INSERTION PORT-A-CATH;  Surgeon: [name], MD;  Location: [place];  Service: General;  Laterality: N/A;  . TRANSTHORACIC ECHOCARDIOGRAM  12/2015   [facility] May 2017: Mild concentric LVH. Global hypokinesis. GR daily. EF 18% severe LA dilation. Mitral annular dilatation with papillary muscle dysfunction and moderate MR. Dilated IVC consistent with elevated RAP.    I have reviewed the social history and family history with the patient and they are unchanged from previous note.  ALLERGIES:  is  allergic to latex; tylenol with codeine #3 [acetaminophen-codeine]; and penicillins.  MEDICATIONS:  Current Outpatient Medications  Medication Sig Dispense Refill  . amLODipine (NORVASC) 5 MG tablet Take 1 tablet (5 mg total) by mouth daily. 30 tablet 5  . aspirin 81 MG chewable tablet Chew 81 mg by mouth daily.    . carvedilol (COREG) 12.5 MG tablet Take 1 tablet (12.5 mg total) by mouth 2 (two) times daily with a meal. 60 tablet 5  . dexamethasone (DECADRON) 4 MG tablet Take 2 tablets (8 mg total) by mouth 2 (two) times daily. Start the day before Taxotere. Then again the day after chemo for 3 days. 30 tablet 1  . doxycycline (VIBRA-TABS) 100 MG tablet Take 100 mg by mouth 2 (two) times daily.  0  . furosemide (LASIX) 40 MG tablet Take 1 tablet (40 mg total) by mouth daily. 30 tablet 5  . lidocaine-prilocaine (EMLA) cream Apply to affected area once 30 g 3  . naproxen (NAPROSYN) 500 MG tablet Take 1 tablet (500 mg total) by mouth daily as needed for mild pain. 20 tablet 1  . ofloxacin (OCUFLOX) 0.3 % ophthalmic solution INSTILL 1 DROP INTO BOTH EYES 3 TIMES A DAY  0  . ondansetron (ZOFRAN) 8 MG tablet Take 1  tablet (8 mg total) by mouth 2 (two) times daily as needed for refractory nausea / vomiting. Start on day 3 after chemo. (Patient not taking: Reported on 08/18/2017) 30 tablet 1  . oxyCODONE (OXY IR/ROXICODONE) 5 MG immediate release tablet Take 1-2 tablets (5-10 mg total) by mouth every 6 (six) hours as needed for moderate pain, severe pain or breakthrough pain. (Patient not taking: Reported on 08/18/2017) 20 tablet 0  . predniSONE (DELTASONE) 20 MG tablet Take 40 mg by mouth daily.  0  . prochlorperazine (COMPAZINE) 10 MG tablet Take 1 tablet (10 mg total) by mouth every 6 (six) hours as needed (Nausea or vomiting). (Patient not taking: Reported on 08/18/2017) 30 tablet 1  . sacubitril-valsartan (ENTRESTO) 24-26 MG Take 1 tablet by mouth 2 (two) times daily. 60 tablet 5  . spironolactone  (ALDACTONE) 25 MG tablet Take 1 tablet (25 mg total) by mouth daily. (Patient taking differently: Take 25 mg every evening by mouth. ) 30 tablet 5  . valACYclovir (VALTREX) 1000 MG tablet Take 1 tablet (1,000 mg total) by mouth daily. 7 tablet 6  . vitamin C (ASCORBIC ACID) 500 MG tablet Take 1,000 mg 2 (two) times daily by mouth.     No current facility-administered medications for this visit.     PHYSICAL EXAMINATION: ECOG PERFORMANCE STATUS: 2 - Symptomatic, <50% confined to bed  There were no vitals filed for this visit. There were no vitals filed for this visit.  GENERAL:alert, no distress and comfortable; side-lying on exam table SKIN: skin color, texture, turgor are normal, no rashes or significant lesions EYES: normal, conjunctiva are pink and non-injected, sclera clear OROPHARYNX:no exudate, no erythema and lips, buccal mucosa, and tongue normal  NECK: supple, thyroid normal size, non-tender, without nodularity LYMPH:  no palpable cervical, supraclavicular, or axillary lymphadenopathy LUNGS: clear to auscultation bilaterally with normal breathing effort HEART: regular rate & rhythm and no murmurs and no lower extremity edema ABDOMEN:abdomen soft, nontender and normal bowel sounds  Musculoskeletal:no cyanosis of digits and no clubbing  NEURO: alert & oriented x 3 with fluent speech, no focal motor/sensory deficits BREASTS: (+) s/p left lumpectomy; surgical incision in left breast and axilla are well healed; no palpable mass that I could appreciate; right breast benign  PAC without erythema  LABORATORY DATA:  I have reviewed the data as listed CBC Latest Ref Rng & Units 08/18/2017 08/01/2017 07/25/2017  WBC 3.9 - 10.3 K/uL 22.1(H) 21.0(H) 14.0(H)  Hemoglobin 11.6 - 15.9 g/dL 11.0(L) 12.5 12.4  Hematocrit 34.8 - 46.6 % 34.0(L) 37.6 37.5  Platelets 145 - 400 K/uL 774(H) 253 324     CMP Latest Ref Rng & Units 08/18/2017 08/01/2017 07/25/2017  Glucose 70 - 140 mg/dL 156(H) 104  161(H)  BUN 7 - 26 mg/dL 18 14.5 25.7  Creatinine 0.60 - 1.10 mg/dL 0.85 0.9 0.9  Sodium 136 - 145 mmol/L 140 138 140  Potassium 3.3 - 4.7 mmol/L 4.4 4.2 4.3  Chloride 98 - 109 mmol/L 107 - -  CO2 22 - 29 mmol/L 26 21(L) 20(L)  Calcium 8.4 - 10.4 mg/dL 10.3 10.6(H) 10.2  Total Protein 6.4 - 8.3 g/dL 7.6 7.3 8.2  Total Bilirubin 0.2 - 1.2 mg/dL 0.5 0.60 0.42  Alkaline Phos 40 - 150 U/L 108 136 106  AST 5 - 34 U/L 14 37(H) 15  ALT 0 - 55 U/L 35 40 15   PATHOLOGY  Diagnosis 06/26/17  1. Breast, lumpectomy, Left - INVASIVE DUCTAL CARCINOMA, GRADE III/III, SPANNING 1.2  CM. - THE SURGICAL RESECTION MARGINS ARE NEGATIVE FOR CARCINOMA. - SEE ONCOLOGY TABLE BELOW. 2. Lymph node, sentinel, biopsy, Left axillary #1 - THERE IS NO EVIDENCE OF CARCINOMA IN 1 OF 1 LYMPH NODE (0/1). 3. Lymph node, sentinel, biopsy, Left axillary #2 - THERE IS NO EVIDENCE OF CARCINOMA IN 1 OF 1 LYMPH NODE (0/1). 4. Lymph node, sentinel, biopsy, Left axillary #3 - THERE IS NO EVIDENCE OF CARCINOMA IN 1 OF 1 LYMPH NODE (0/1). Microscopic Comment 1. BREAST, INVASIVE TUMOR Procedure: Seed localized lumpectomy. Laterality: Left. Tumor Size: 1.2 cm (gross measurement). Histologic Type: Ductal. Grade: III. Tubular Differentiation: 3. Nuclear Pleomorphism: 2. Mitotic Count: 3. Ductal Carcinoma in Situ (DCIS): Not identified. Extent of Tumor: Confined to breast parenchyma. Margins: Greater than 0.2 cm to all margins. Regional Lymph Nodes: Number of Lymph Nodes Examined: 3. Number of Sentinel Lymph Nodes Examined: 3. Lymph Nodes with Macrometastases: 0. Lymph Nodes with Micrometastases: 0. Lymph Nodes with Isolated Tumor Cells: 0. Breast Prognostic Profile: Case [id number]. Estrogen Receptor: 0%. 1 of 3 FINAL for Arif, Kristin Ward [phone]) Microscopic Comment(continued) Progesterone Receptor: 0%. Her2: No amplification was detected. Ki-67: 70%. Best tumor block for sendout testing: 1A or  1B. Pathologic Stage Classification (pTNM, AJCC 8th Edition): Primary Tumor (pT): pT1c. Regional Lymph Nodes (pN): pN0. Distant Metastases (pM): pMX.   Diagnosis 1. Breast, left, needle core biopsy, 5:30 o'clock - INVASIVE DUCTAL CARCINOMA - SEE COMMENT 2. Lymph node, needle/core biopsy, left axillary - NO CARCINOMA IDENTIFIED IN ONE LYMPH NODE (0/1) - SEE COMMENT Microscopic Comment 1. Based on the biopsy, the carcinoma appears Nottingham grade 3 of 3 and measures 1.2 cm in greatest linear extent. Prognostic markers (ER/PR/ki-67/HER2-FISH) are pending and will be reported in an addendum. Dr. Lyndon Code has reviewed the case and agrees with above diagnosis. These results were called to [facility] on June 10, 2017. 2. Deeper levels were examined. Cytokeratin AE1/3 is negative supporting the above diagnosis. 1. PROGNOSTIC INDICATORS Results: IMMUNOHISTOCHEMICAL AND MORPHOMETRIC ANALYSIS PERFORMED MANUALLY Estrogen Receptor: 0%, NEGATIVE Progesterone Receptor: 0%, NEGATIVE Proliferation Marker Ki67: 70%  ECHO 05/14/17  LV EF 45-50% Study Conclusions - Left ventricle: The cavity size was normal. There was mild   concentric hypertrophy. Systolic function was mildly reduced. The   estimated ejection fraction was in the range of 45% to 50%.   Diffuse hypokinesis. Doppler parameters are consistent with   abnormal left ventricular relaxation (grade 1 diastolic   dysfunction). - Aorta: Aortic root dimension: 38 mm (ED). - Ascending aorta: The ascending aorta was mildly dilated.  RADIOGRAPHIC STUDIES: I have personally reviewed the radiological images as listed and agreed with the findings in the report. No results found.   ASSESSMENT & PLAN: Lateisha Thurlow is a 62 y.o. female with a history of HTN, COPD, retinitis pigmentosa, and CHF with EF 45-50%, presented with screening discovered left breast cancer.   1. Malignant neoplasm of lower-outer quadrant of  left breast, invasive ductal carcinoma, stage IB, pT1c, N0, M0, Triple negative, Grade 3 2. Genetics 3. Chronic combined systolic and diastolic heart failure, EF 45-50% 4. HTN, COPD, retinitis pigmentosa with limited vision 5. Financial support 6. Diarrhea, dehydration 7. Mild hypercalcemia 8. Uveitis      PLAN    All questions were answered. The patient knows to call the clinic with any problems, questions or concerns. No barriers to learning was detected.     Kristin Feeling, NP 08/24/17

## 2017-08-25 ENCOUNTER — Encounter (HOSPITAL_COMMUNITY): Payer: Self-pay | Admitting: Emergency Medicine

## 2017-08-25 ENCOUNTER — Inpatient Hospital Stay: Payer: Medicare HMO

## 2017-08-25 ENCOUNTER — Inpatient Hospital Stay: Payer: Medicare HMO | Admitting: Nurse Practitioner

## 2017-08-25 ENCOUNTER — Observation Stay (HOSPITAL_COMMUNITY)
Admission: EM | Admit: 2017-08-25 | Discharge: 2017-08-26 | Disposition: A | Discharge status: Home / Self Care | Payer: Medicare HMO | Attending: Internal Medicine | Admitting: Internal Medicine

## 2017-08-25 ENCOUNTER — Other Ambulatory Visit: Payer: Self-pay

## 2017-08-25 ENCOUNTER — Emergency Department (HOSPITAL_COMMUNITY): Payer: Medicare HMO

## 2017-08-25 DIAGNOSIS — H548 Legal blindness, as defined in USA: Secondary | ICD-10-CM | POA: Insufficient documentation

## 2017-08-25 DIAGNOSIS — I5042 Chronic combined systolic (congestive) and diastolic (congestive) heart failure: Secondary | ICD-10-CM | POA: Diagnosis present

## 2017-08-25 DIAGNOSIS — Z880 Allergy status to penicillin: Secondary | ICD-10-CM | POA: Insufficient documentation

## 2017-08-25 DIAGNOSIS — I110 Hypertensive heart disease with heart failure: Secondary | ICD-10-CM | POA: Diagnosis not present

## 2017-08-25 DIAGNOSIS — H543 Unqualified visual loss, both eyes: Secondary | ICD-10-CM | POA: Diagnosis present

## 2017-08-25 DIAGNOSIS — R1110 Vomiting, unspecified: Secondary | ICD-10-CM | POA: Diagnosis not present

## 2017-08-25 DIAGNOSIS — Z171 Estrogen receptor negative status [ER-]: Secondary | ICD-10-CM

## 2017-08-25 DIAGNOSIS — I2510 Atherosclerotic heart disease of native coronary artery without angina pectoris: Secondary | ICD-10-CM | POA: Diagnosis not present

## 2017-08-25 DIAGNOSIS — R42 Dizziness and giddiness: Secondary | ICD-10-CM | POA: Diagnosis not present

## 2017-08-25 DIAGNOSIS — Z79899 Other long term (current) drug therapy: Secondary | ICD-10-CM | POA: Diagnosis not present

## 2017-08-25 DIAGNOSIS — H3552 Pigmentary retinal dystrophy: Secondary | ICD-10-CM

## 2017-08-25 DIAGNOSIS — R55 Syncope and collapse: Secondary | ICD-10-CM | POA: Diagnosis not present

## 2017-08-25 DIAGNOSIS — I10 Essential (primary) hypertension: Secondary | ICD-10-CM | POA: Diagnosis present

## 2017-08-25 DIAGNOSIS — C50512 Malignant neoplasm of lower-outer quadrant of left female breast: Secondary | ICD-10-CM | POA: Diagnosis not present

## 2017-08-25 DIAGNOSIS — I420 Dilated cardiomyopathy: Secondary | ICD-10-CM | POA: Diagnosis not present

## 2017-08-25 DIAGNOSIS — Z87891 Personal history of nicotine dependence: Secondary | ICD-10-CM | POA: Diagnosis not present

## 2017-08-25 DIAGNOSIS — J449 Chronic obstructive pulmonary disease, unspecified: Secondary | ICD-10-CM | POA: Diagnosis present

## 2017-08-25 DIAGNOSIS — R197 Diarrhea, unspecified: Secondary | ICD-10-CM | POA: Diagnosis not present

## 2017-08-25 DIAGNOSIS — Z7982 Long term (current) use of aspirin: Secondary | ICD-10-CM | POA: Diagnosis not present

## 2017-08-25 LAB — BASIC METABOLIC PANEL
Anion gap: 9 (ref 5–15)
BUN: 12 mg/dL (ref 6–20)
CALCIUM: 9.2 mg/dL (ref 8.9–10.3)
CO2: 23 mmol/L (ref 22–32)
Chloride: 106 mmol/L (ref 101–111)
Creatinine, Ser: 0.89 mg/dL (ref 0.44–1.00)
GFR calc Af Amer: >60 mL/min (ref >60)
GLUCOSE: 151 mg/dL — AB (ref 65–99)
Potassium: 3.6 mmol/L (ref 3.5–5.1)
Sodium: 138 mmol/L (ref 135–145)

## 2017-08-25 LAB — CBC WITH DIFFERENTIAL/PLATELET
BASOS ABS: 0.1 10*3/uL (ref 0.0–0.1)
BASOS PCT: 1 %
EOS ABS: 0.2 10*3/uL (ref 0.0–0.7)
Eosinophils Relative: 2 %
HEMATOCRIT: 32.9 % — AB (ref 36.0–46.0)
Hemoglobin: 10.9 g/dL — ABNORMAL LOW (ref 12.0–15.0)
LYMPHS ABS: 2.3 10*3/uL (ref 0.7–4.0)
Lymphocytes Relative: 20 %
MCH: 30.6 pg (ref 26.0–34.0)
MCHC: 33.1 g/dL (ref 30.0–36.0)
MCV: 92.4 fL (ref 78.0–100.0)
Monocytes Absolute: 1.6 10*3/uL — ABNORMAL HIGH (ref 0.1–1.0)
Monocytes Relative: 14 %
NEUTROS ABS: 7.2 10*3/uL (ref 1.7–7.7)
Neutrophils Relative %: 63 %
Platelets: 246 10*3/uL (ref 150–400)
RBC: 3.56 MIL/uL — ABNORMAL LOW (ref 3.87–5.11)
RDW: 15.1 % (ref 11.5–15.5)
WBC: 11.4 10*3/uL — ABNORMAL HIGH (ref 4.0–10.5)

## 2017-08-25 LAB — TROPONIN I: Troponin I: <0.03 ng/mL (ref <0.03)

## 2017-08-25 LAB — I-STAT CG4 LACTIC ACID, ED: Lactic Acid, Venous: 2.38 mmol/L (ref 0.5–1.9)

## 2017-08-25 LAB — MAGNESIUM: MAGNESIUM: 1.8 mg/dL (ref 1.7–2.4)

## 2017-08-25 LAB — CBG MONITORING, ED: GLUCOSE-CAPILLARY: 232 mg/dL — AB (ref 65–99)

## 2017-08-25 LAB — MRSA PCR SCREENING: MRSA BY PCR: NEGATIVE

## 2017-08-25 LAB — D-DIMER, QUANTITATIVE (NOT AT ARMC): D DIMER QUANT: 0.43 ug{FEU}/mL (ref 0.00–0.50)

## 2017-08-25 MED ORDER — SODIUM CHLORIDE 0.9% FLUSH
3.0000 mL | Freq: Two times a day (BID) | INTRAVENOUS | Status: DC
  Administered 2017-08-26: 3 mL via INTRAVENOUS

## 2017-08-25 MED ORDER — ONDANSETRON HCL 4 MG/2ML IJ SOLN: 4.0000 mg | Freq: Four times a day (QID) | INTRAMUSCULAR | Status: DC | PRN

## 2017-08-25 MED ORDER — ONDANSETRON HCL 4 MG PO TABS: 4.0000 mg | ORAL_TABLET | Freq: Four times a day (QID) | ORAL | Status: DC | PRN

## 2017-08-25 MED ORDER — ASPIRIN 81 MG PO CHEW
81.0000 mg | CHEWABLE_TABLET | Freq: Every day | ORAL | Status: DC
  Administered 2017-08-25 – 2017-08-26 (×2): 81 mg via ORAL
  Filled 2017-08-25 (×2): qty 1

## 2017-08-25 MED ORDER — SODIUM CHLORIDE 0.9 % IV SOLN
INTRAVENOUS | Status: DC
  Administered 2017-08-25: 11:00:00 via INTRAVENOUS

## 2017-08-25 MED ORDER — SODIUM CHLORIDE 0.9 % IV SOLN
INTRAVENOUS | Status: DC
  Administered 2017-08-25 – 2017-08-26 (×2): via INTRAVENOUS

## 2017-08-25 NOTE — ED Triage Notes (Signed)
Patient had syncopal episode in cancer center with pressures 50's/30's. Brought to ED for further evaluation

## 2017-08-25 NOTE — ED Provider Notes (Signed)
[facility]-ICU/STEPDOWN Provider Note   CSN: [id number] Arrival date & time: 08/25/17  0911     History   Chief Complaint Chief Complaint  Patient presents with  . Loss of Consciousness    HPI Kristin Ward is a 62 y.o. female.  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  She was at the cancer center to get lab work and see the physician when she passed out.  She states she felt dizzy while sitting in the chair but had not started the blood work process.  Daughter noticed that she slumped backwards and then slid out of the chair.  Her head was still in the chair and she never hit her head.  She was still a little bit awake but then passed out again.  First blood pressure from the cancer center was around 50 systolic.  The patient had an episode of emesis when being transported from the cancer center over to here.  The patient had breast cancer surgery in November 2018 and had chemotherapy last week.  Since chemotherapy she has had diarrhea alternating with constipation and a few episodes of emesis.  She has not complained of chest pain, shortness of breath, or focal weakness.  Patient denies feeling dizzy at this time. No palpitations.  Past Medical History:  Diagnosis Date  . Abnormal myocardial perfusion study 03/15/2016   EF 15%, severe LV systolic dysfcn, global hypokinesis and inferior akinesis. dilated LV  . Abnormal x-ray of lungs with single pulmonary nodule 03/15/2016   per records from [place]   . Blind   . Cholelithiasis 05/19/2017   On CT  . COPD (chronic obstructive pulmonary disease) ([place])    PT DENIES  . Coronary artery calcification seen on CAT scan 05/19/2017  . 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 Diagnosed in [facility]  . Family history of colon cancer     . Genetic testing 06/19/2017   Multi-Cancer panel (83 genes) @ [facility] - No pathogenic mutations detected  . Headache    NONE RECENT  . Hypertension   . Malignant neoplasm of lower-outer quadrant of left breast of female, estrogen receptor negative ([place]) 06/11/2017  . Retinitis pigmentosa   . Solitary pulmonary nodule on lung CT 03/16/2016   per records from [place]  . Uveitis     Patient Active Problem List   Diagnosis Date Noted  . Syncope 08/25/2017  . Blind in both eyes 08/25/2017  . Genetic testing 06/19/2017  . Family history of colon cancer   . Malignant neoplasm of lower-outer quadrant of left breast of female, estrogen receptor negative ([place]) 06/11/2017  . Cholelithiasis 05/19/2017  . Coronary artery calcification seen on CAT scan 05/19/2017  . Low grade squamous intraepith lesion on cytologic smear cervix ([place]) 04/09/2017  . HPV (human papilloma virus) infection 04/09/2017  . COPD (chronic obstructive pulmonary disease) ([place])   . Essential hypertension 03/20/2017  . Screen for colon cancer 03/20/2017  . Retinitis pigmentosa   . Blind   . Solitary pulmonary nodule on lung CT 03/16/2016  . Abnormal myocardial perfusion study 03/15/2016  . Abnormal x-ray of lungs with single pulmonary nodule 03/15/2016  . Chronic combined systolic and diastolic heart failure ([place]) 12/28/2015  . Dilated cardiomyopathy ([place]) 12/27/2015    Past Surgical History:  Procedure Laterality Date  . brain cyst removed  2007  to help with headaches per patient , aspirated   . BREAST LUMPECTOMY WITH RADIOACTIVE SEED AND SENTINEL LYMPH NODE BIOPSY Left 06/26/2017   Procedure: BREAST LUMPECTOMY WITH RADIOACTIVE SEED AND SENTINEL LYMPH NODE BIOPSY ERAS  PATHWAY;  Surgeon: [name], MD;  Location: [place];  Service: General;  Laterality: Left;  pec block  . NASAL TURBINATE REDUCTION    . PORTACATH PLACEMENT N/A 06/26/2017   Procedure: INSERTION PORT-A-CATH;  Surgeon: [name], MD;   Location: [place];  Service: General;  Laterality: N/A;  . TRANSTHORACIC ECHOCARDIOGRAM  12/2015   [facility] May 2017: Mild concentric LVH. Global hypokinesis. GR daily. EF 18% severe LA dilation. Mitral annular dilatation with papillary muscle dysfunction and moderate MR. Dilated IVC consistent with elevated RAP.    OB History    Gravida Para Term Preterm AB Living   2 2 2     2    SAB TAB Ectopic Multiple Live Births           2       Home Medications    Prior to Admission medications   Medication Sig Start Date End Date Taking? Authorizing Provider  carvedilol (COREG) 12.5 MG tablet Take 1 tablet (12.5 mg total) by mouth 2 (two) times daily with a meal. 05/16/17  Yes [name], MD  dexamethasone (DECADRON) 4 MG tablet Take 2 tablets (8 mg total) by mouth 2 (two) times daily. Start the day before Taxotere. Then again the day after chemo for 3 days. 07/10/17  Yes [name], MD  naproxen (NAPROSYN) 500 MG tablet Take 1 tablet (500 mg total) by mouth daily as needed for mild pain. 08/21/17  Yes Alla Feeling, NP  ofloxacin (OCUFLOX) 0.3 % ophthalmic solution INSTILL 1 DROP INTO BOTH EYES 3 TIMES A DAY 08/13/17  Yes [provider]  oxyCODONE (OXY IR/ROXICODONE) 5 MG immediate release tablet Take 1-2 tablets (5-10 mg total) by mouth every 6 (six) hours as needed for moderate pain, severe pain or breakthrough pain. 08/01/17  Yes Alla Feeling, NP  predniSONE (DELTASONE) 10 MG tablet Take 10 mg by mouth daily.  08/07/17  Yes [provider]  sacubitril-valsartan (ENTRESTO) 24-26 MG Take 1 tablet by mouth 2 (two) times daily. 05/16/17  Yes [name], MD  spironolactone (ALDACTONE) 25 MG tablet Take 1 tablet (25 mg total) by mouth daily. 05/16/17  Yes [name], MD  valACYclovir (VALTREX) 1000 MG tablet Take 1 tablet (1,000 mg total) by mouth daily. Patient taking differently: Take 1,000 mg by mouth daily as needed.  05/14/17  Yes Dove, Myra C, MD    vitamin C (ASCORBIC ACID) 500 MG tablet Take 1,000 mg 2 (two) times daily by mouth.   Yes [provider]  amLODipine (NORVASC) 5 MG tablet Take 1 tablet (5 mg total) by mouth daily. Patient not taking: Reported on 08/25/2017 05/16/17   [name], MD  aspirin 81 MG chewable tablet Chew 81 mg by mouth daily.    [provider]  doxycycline (VIBRA-TABS) 100 MG tablet Take 100 mg by mouth 2 (two) times daily. 08/07/17   [provider]  furosemide (LASIX) 40 MG tablet Take 1 tablet (40 mg total) by mouth daily. Patient not taking: Reported on 08/25/2017 05/16/17   [name], MD  lidocaine-prilocaine (EMLA) cream Apply to affected area once Patient not taking: Reported on 08/25/2017 07/10/17   [name], MD  ondansetron (ZOFRAN) 8 MG tablet Take 1 tablet (  8 mg total) by mouth 2 (two) times daily as needed for refractory nausea / vomiting. Start on day 3 after chemo. Patient not taking: Reported on 08/18/2017 07/10/17   [name], MD  prochlorperazine (COMPAZINE) 10 MG tablet Take 1 tablet (10 mg total) by mouth every 6 (six) hours as needed (Nausea or vomiting). Patient not taking: Reported on 08/18/2017 07/10/17   [name], MD    Family History Family History  Problem Relation Age of Onset  . Colon cancer Mother 77       deceased 86  . Heart attack Father   . Breast cancer Other 42       2nd cousin on maternal side; currently 33  . Pancreatic cancer Other        2nd cousin once removed on maternal side; deceased 4s    Social History Social History   Tobacco Use  . Smoking status: Former Smoker    Packs/day: 0.25    Years: 23.00    Pack years: 5.75    Types: Cigarettes    Last attempt to quit: 10/09/2015    Years since quitting: 1.8  . Smokeless tobacco: Never Used  Substance Use Topics  . Alcohol use: No  . Drug use: No     Allergies   Latex; Tylenol with codeine #3 [acetaminophen-codeine]; and Penicillins   Review of Systems Review of  Systems  Respiratory: Negative for shortness of breath.   Cardiovascular: Negative for chest pain.  Gastrointestinal: Positive for diarrhea and vomiting. Negative for abdominal pain.  Neurological: Positive for dizziness and syncope. Negative for weakness and headaches.  All other systems reviewed and are negative.    Physical Exam Updated Vital Signs BP (!) 108/92   Pulse 92   Temp 97.7 F (36.5 C) (Oral)   Resp (!) 23   SpO2 100%   Physical Exam  Constitutional: She is oriented to person, place, and time. She appears well-developed and well-nourished. No distress.  HENT:  Head: Normocephalic and atraumatic.  Right Ear: External ear normal.  Left Ear: External ear normal.  Nose: Nose normal.  Eyes: Right eye exhibits no discharge. Left eye exhibits no discharge.  Neck: Neck supple.  Cardiovascular: Normal rate, regular rhythm and normal heart sounds.  No murmur heard. Pulmonary/Chest: Effort normal and breath sounds normal. She has no wheezes. She has no rales.  Abdominal: Soft. There is no tenderness.  Neurological: She is alert and oriented to person, place, and time.  Skin: Skin is warm and dry. She is not diaphoretic.  Nursing note and vitals reviewed.    ED Treatments / Results  Labs (all labs ordered are listed, but only abnormal results are displayed) Labs Reviewed  BASIC METABOLIC PANEL - Abnormal; Notable for the following components:      Result Value   Glucose, Bld 151 (*)    All other components within normal limits  CBC WITH DIFFERENTIAL/PLATELET - Abnormal; Notable for the following components:   WBC 11.4 (*)    RBC 3.56 (*)    Hemoglobin 10.9 (*)    HCT 32.9 (*)    Monocytes Absolute 1.6 (*)    All other components within normal limits  CBG MONITORING, ED - Abnormal; Notable for the following components:   Glucose-Capillary 232 (*)    All other components within normal limits  I-STAT CG4 LACTIC ACID, ED - Abnormal; Notable for the following  components:   Lactic Acid, Venous 2.38 (*)    All other components within normal  limits  MRSA PCR SCREENING  MAGNESIUM  TROPONIN I  D-DIMER, QUANTITATIVE (NOT AT [facility])  TROPONIN I  TROPONIN I  TROPONIN I  BASIC METABOLIC PANEL  CBC    EKG  EKG Interpretation  Date/Time:  Monday August 25 2017 09:27:13 EST Ventricular Rate:  83 PR Interval:    QRS Duration: 99 QT Interval:  416 QTC Calculation: 489 R Axis:   6 Text Interpretation:  Sinus rhythm Consider left atrial enlargement Borderline prolonged QT interval No old tracing to compare Confirmed by Sherwood Gambler [phone]) on 08/25/2017 10:40:49 AM       Radiology Dg Chest 2 View  Result Date: 08/25/2017 CLINICAL DATA:  Is syncope. EXAM: CHEST  2 VIEW COMPARISON:  Chest x-rays dated 06/26/2017 and 10/09/2015 FINDINGS: The heart size and mediastinal contours are within normal limits. Both lungs are clear. The visualized skeletal structures are unremarkable. Power port in place, unchanged.  Surgical clips in the left axilla. IMPRESSION: No active cardiopulmonary disease. Electronically Signed   By: Lorriane Shire M.D.   On: 08/25/2017 10:22    Procedures Procedures (including critical care time)  Medications Ordered in ED Medications  aspirin chewable tablet 81 mg (not administered)  sodium chloride flush (NS) 0.9 % injection 3 mL (not administered)  0.9 %  sodium chloride infusion ( Intravenous New Bag/Given 08/25/17 1653)  ondansetron (ZOFRAN) tablet 4 mg (not administered)    Or  ondansetron (ZOFRAN) injection 4 mg (not administered)     Initial Impression / Assessment and Plan / ED Course  I have reviewed the triage vital signs and the nursing notes.  Pertinent labs & imaging results that were available during my care of the patient were reviewed by me and considered in my medical decision making (see chart for details).     Lab work is unremarkable.  No clear etiology for her syncope.  Discussed with the  hospitalist, Dr. Shanon Brow, who asks for troponin, d-dimer, and lactic acid.  PE is of some consideration but at the same time she has had no shortness of breath, chest pain, or unilateral swelling.  Given her history of dilated cardiomyopathy, she will need to be observed as her potential for a ventricular arrhythmia is present.  Currently vital signs are unremarkable.  Admit for observation.  Final Clinical Impressions(s) / ED Diagnoses   Final diagnoses:  Syncope and collapse    ED Discharge Orders    None       Sherwood Gambler, MD 08/25/17 1719

## 2017-08-25 NOTE — ED Notes (Signed)
Patient transported to X-ray 

## 2017-08-25 NOTE — ED Notes (Signed)
Bed: SB97 Expected date:  Expected time:  Means of arrival:  Comments: Held for Res B

## 2017-08-25 NOTE — H&P (Signed)
History and Physical    Kristin Ward SLH:[id number] DOB: 1956/05/19 DOA: 08/25/2017  PCP: Girtha Rm, NP-C  Patient coming from:  Oncology clinic  Chief Complaint:  Passed out at oncology clinic and had a code blue  HPI: Kristin Ward is a 62 y.o. 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  Pt reports she was feeling fine.  She does have some nausea and diarrhea associated with her chemo and she tries to stay well hydrated.  She denies any fevers.  No cough.  No sob.  No chest pain.  No swelling in legs or pain in legs.  No urinary symptoms.  She does not recall what happened prior to her syncope.  She just remembers going to sit in the lab chair.  She passed out while sitting there, her fall was controlled by staff so she did not have any head trauma.  A code blue was called. EDP dr Wilson Singer responded to code blue and reported pt never lost a pulse.  Her sbp was in the 50s.  She came too quickly and after she was lying for a couple minutes her sbp returned to over 100.  She did not have any siezure like activity reported nor postictal state.  Pt has been in ED now, with normal on the low end bp and otherwise normal vitals.  She denies anything at this time and feels normal.  She denies any focal numbness, tingling or weakness.  She is blind at baseline.  She is referred for admission for syncopal episode.  Review of Systems: As per HPI otherwise 10 point review of systems negative.   Past Medical History:  Diagnosis Date  . Abnormal myocardial perfusion study 03/15/2016   EF 15%, severe LV systolic dysfcn, global hypokinesis and inferior akinesis. dilated LV  . Abnormal x-ray of lungs with single pulmonary nodule  03/15/2016   per records from [place]   . Blind   . Cholelithiasis 05/19/2017   On CT  . COPD (chronic obstructive pulmonary disease) ([place])    PT DENIES  . Coronary artery calcification seen on CAT scan 05/19/2017  . Dilated idiopathic cardiomyopathy ([place]) 12/2015   EF 15-20%. Diagnosed in [facility]  . Family history of colon cancer   . Genetic testing 06/19/2017   Multi-Cancer panel (83 genes) @ [facility] - No pathogenic mutations detected  . Headache    NONE RECENT  . Hypertension   . Malignant neoplasm of lower-outer quadrant of left breast of female, estrogen receptor negative ([place]) 06/11/2017  . Retinitis pigmentosa   . Solitary pulmonary nodule on lung CT 03/16/2016   per records from [place]  . Uveitis     Past Surgical History:  Procedure Laterality Date  . brain cyst removed  2007   to help with headaches per patient , aspirated   . BREAST LUMPECTOMY WITH RADIOACTIVE SEED AND SENTINEL LYMPH NODE BIOPSY Left 06/26/2017   Procedure: BREAST LUMPECTOMY WITH RADIOACTIVE SEED AND SENTINEL LYMPH NODE BIOPSY ERAS  PATHWAY;  Surgeon: [name], MD;  Location: [place];  Service: General;  Laterality: Left;  pec block  . NASAL TURBINATE REDUCTION    . PORTACATH PLACEMENT N/A 06/26/2017   Procedure: INSERTION  PORT-A-CATH;  Surgeon: [name], MD;  Location: [facility] OR;  Service: General;  Laterality: N/A;  . TRANSTHORACIC ECHOCARDIOGRAM  12/2015   [facility] May 2017: Mild concentric LVH. Global hypokinesis. GR daily. EF 18% severe LA dilation. Mitral annular dilatation with papillary muscle dysfunction and moderate MR. Dilated IVC consistent with elevated RAP.     reports that she quit smoking about 22 months ago. Her smoking use included cigarettes. She has a 5.75 pack-year smoking history. she has never used smokeless tobacco. She reports that she does not drink alcohol or use drugs.  Allergies  Allergen Reactions  . Latex Hives and Itching     Burning   . Tylenol With Codeine #3 [Acetaminophen-Codeine] Nausea And Vomiting  . Penicillins Nausea Only, Swelling and Rash    Has patient had a PCN reaction causing immediate rash, facial/tongue/throat swelling, SOB or lightheadedness with hypotension: Yes Has patient had a PCN reaction causing severe rash involving mucus membranes or skin necrosis: Yes Has patient had a PCN reaction that required hospitalization: No Has patient had a PCN reaction occurring within the last 10 years: No TONGUE SWELLING AND RASH AROUND MOUTH If all of the above answers are "NO", then may proceed with Cephalosporin use.     Family History  Problem Relation Age of Onset  . Colon cancer Mother 66       deceased 20  . Heart attack Father   . Breast cancer Other 40       2nd cousin on maternal side; currently 43  . Pancreatic cancer Other        2nd cousin once removed on maternal side; deceased 55s    Prior to Admission medications   Medication Sig Start Date End Date Taking? Authorizing Provider  amLODipine (NORVASC) 5 MG tablet Take 1 tablet (5 mg total) by mouth daily. 05/16/17   [name], MD  aspirin 81 MG chewable tablet Chew 81 mg by mouth daily.    [provider]  carvedilol (COREG) 12.5 MG tablet Take 1 tablet (12.5 mg total) by mouth 2 (two) times daily with a meal. 05/16/17   [name], MD  dexamethasone (DECADRON) 4 MG tablet Take 2 tablets (8 mg total) by mouth 2 (two) times daily. Start the day before Taxotere. Then again the day after chemo for 3 days. 07/10/17   [name], MD  doxycycline (VIBRA-TABS) 100 MG tablet Take 100 mg by mouth 2 (two) times daily. 08/07/17   [provider]  furosemide (LASIX) 40 MG tablet Take 1 tablet (40 mg total) by mouth daily. 05/16/17   [name], MD  lidocaine-prilocaine (EMLA) cream Apply to affected area once 07/10/17   [name], MD  naproxen (NAPROSYN) 500 MG tablet Take 1 tablet (500 mg total) by mouth daily as  needed for mild pain. 08/21/17   Alla Feeling, NP  ofloxacin (OCUFLOX) 0.3 % ophthalmic solution INSTILL 1 DROP INTO BOTH EYES 3 TIMES A DAY 08/13/17   [provider]  ondansetron (ZOFRAN) 8 MG tablet Take 1 tablet (8 mg total) by mouth 2 (two) times daily as needed for refractory nausea / vomiting. Start on day 3 after chemo. Patient not taking: Reported on 08/18/2017 07/10/17   [name], MD  oxyCODONE (OXY IR/ROXICODONE) 5 MG immediate release tablet Take 1-2 tablets (5-10 mg total) by mouth every 6 (six) hours as needed for moderate pain, severe pain or breakthrough pain. Patient not taking: Reported on 08/18/2017 08/01/17  Alla Feeling, NP  predniSONE (DELTASONE) 20 MG tablet Take 40 mg by mouth daily. 08/07/17   [provider]  prochlorperazine (COMPAZINE) 10 MG tablet Take 1 tablet (10 mg total) by mouth every 6 (six) hours as needed (Nausea or vomiting). Patient not taking: Reported on 08/18/2017 07/10/17   [name], MD  sacubitril-valsartan (ENTRESTO) 24-26 MG Take 1 tablet by mouth 2 (two) times daily. 05/16/17   [name], MD  spironolactone (ALDACTONE) 25 MG tablet Take 1 tablet (25 mg total) by mouth daily. Patient taking differently: Take 25 mg every evening by mouth.  05/16/17   [name], MD  valACYclovir (VALTREX) 1000 MG tablet Take 1 tablet (1,000 mg total) by mouth daily. 05/14/17   Emily Filbert, MD  vitamin C (ASCORBIC ACID) 500 MG tablet Take 1,000 mg 2 (two) times daily by mouth.    [provider]    Physical Exam: Vitals:   08/25/17 1045 08/25/17 1100 08/25/17 1115 08/25/17 1209  BP: 107/73 105/77 111/74 118/78  Pulse: 84 87 85 88  Resp: 12 17 (!) 21 14  Temp: 97.7 F (36.5 C)     TempSrc: Oral     SpO2: 98% 99% 97% 100%    Constitutional: NAD, calm, comfortable and blind, she can see shadow when you are close up to her face Vitals:   08/25/17 1045 08/25/17 1100 08/25/17 1115 08/25/17 1209  BP: 107/73 105/77 111/74 118/78    Pulse: 84 87 85 88  Resp: 12 17 (!) 21 14  Temp: 97.7 F (36.5 C)     TempSrc: Oral     SpO2: 98% 99% 97% 100%   Eyes: PERRL, lids and conjunctivae normal ENMT: Mucous membranes are moist. Posterior pharynx clear of any exudate or lesions.Normal dentition.  Neck: normal, supple, no masses, no thyromegaly Respiratory: clear to auscultation bilaterally, no wheezing, no crackles. Normal respiratory effort. No accessory muscle use.  Cardiovascular: Regular rate and rhythm, no murmurs / rubs / gallops. No extremity edema. 2+ pedal pulses. No carotid bruits.  Abdomen: no tenderness, no masses palpated. No hepatosplenomegaly. Bowel sounds positive.  Musculoskeletal: no clubbing / cyanosis. No joint deformity upper and lower extremities. Good ROM, no contractures. Normal muscle tone.  Skin: no rashes, lesions, ulcers. No induration Neurologic: CN 2-12 grossly intact other than vision loss. Sensation intact, DTR normal. Strength 5/5 in all 4.  Psychiatric: Normal judgment and insight. Alert and oriented x 3. Normal mood.    Labs on Admission: I have personally reviewed following labs and imaging studies  CBC: Recent Labs  Lab 08/25/17 0923  WBC 11.4*  NEUTROABS 7.2  HGB 10.9*  HCT 32.9*  MCV 92.4  PLT 680   Basic Metabolic Panel: Recent Labs  Lab 08/25/17 0923  NA 138  K 3.6  CL 106  CO2 23  GLUCOSE 151*  BUN 12  CREATININE 0.89  CALCIUM 9.2  MG 1.8   GFR: Estimated Creatinine Clearance: 86 mL/min (by C-G formula based on SCr of 0.89 mg/dL). Liver Function Tests: No results for input(s): AST, ALT, ALKPHOS, BILITOT, PROT, ALBUMIN in the last 168 hours. No results for input(s): LIPASE, AMYLASE in the last 168 hours. No results for input(s): AMMONIA in the last 168 hours. Coagulation Profile: No results for input(s): INR, PROTIME in the last 168 hours. Cardiac Enzymes: Recent Labs  Lab 08/25/17 0923  TROPONINI <0.03   BNP (last 3 results) No results for input(s):  PROBNP in the last 8760 hours.  HbA1C: No results for input(s): HGBA1C in the last 72 hours. CBG: No results for input(s): GLUCAP in the last 168 hours. Lipid Profile: No results for input(s): CHOL, HDL, LDLCALC, TRIG, CHOLHDL, LDLDIRECT in the last 72 hours. Thyroid Function Tests: No results for input(s): TSH, T4TOTAL, FREET4, T3FREE, THYROIDAB in the last 72 hours. Anemia Panel: No results for input(s): VITAMINB12, FOLATE, FERRITIN, TIBC, IRON, RETICCTPCT in the last 72 hours. Urine analysis: No results found for: COLORURINE, APPEARANCEUR, LABSPEC, PHURINE, GLUCOSEU, HGBUR, BILIRUBINUR, KETONESUR, PROTEINUR, UROBILINOGEN, NITRITE, LEUKOCYTESUR Sepsis Labs: !!!!!!!!!!!!!!!!!!!!!!!!!!!!!!!!!!!!!!!!!!!! _0 (procalcitonin:4,lacticidven:4) )No results found for this or any previous visit (from the past 240 hour(s)).   Radiological Exams on Admission: Dg Chest 2 View  Result Date: 08/25/2017 CLINICAL DATA:  Is syncope. EXAM: CHEST  2 VIEW COMPARISON:  Chest x-rays dated 06/26/2017 and 10/09/2015 FINDINGS: The heart size and mediastinal contours are within normal limits. Both lungs are clear. The visualized skeletal structures are unremarkable. Power port in place, unchanged.  Surgical clips in the left axilla. IMPRESSION: No active cardiopulmonary disease. Electronically Signed   By: Lorriane Shire M.D.   On: 08/25/2017 10:22    EKG: Independently reviewed.  nsr no acute issues qtc at high normal Old chart reviewed Case discussed with dr Wilson Singer  Assessment/Plan 62 yo female with multiple medical issues with syncopal episode   Principal Problem:   Syncope- likely from orthostatic hypotension but unknown.  It looks like her EF has recovered and up to over 40% so arrythmia less likely.  Will repeat cardiac echo to ensure this has not fallen.  obs on cardiac monitoring in stepdown due to degree of hypotension during event (there was no cardiac monitoring at that time in oncology office  to my knowledge).  ekg nsr with high end of normal qtc.  Will monitor that.  Serial trop.  No sign of infection anywhere and neg ROS.  No focal neuro deficits so no further neuro imaging.  Will order freq neuro cks to monitor for any changes.  Ck orthostatics per shift.  ivf overnight.  Active Problems:   Chronic combined systolic and diastolic heart failure ([place])- stable, ck cardiac echo.   Essential hypertension- holding all bp meds   Retinitis pigmentosa- stable   COPD (chronic obstructive pulmonary disease) ([place])- stable   Dilated cardiomyopathy ([place])- noted   Malignant neoplasm of lower-outer quadrant of left breast of female, estrogen receptor negative ([place])- noted.  Checking ddimer.  PE low yield , not hypoxic or tachycardic and asymptomatic,  If ddimer neg would be very reassuring,  although pt is high risk   Blind in both eyes- noted    DVT prophylaxis:  scds Code Status:  full Family Communication:  none Disposition Plan:  Per day team Consults called:  none Admission status:  observation   Eren Puebla A MD [facility]  If 7PM-7AM, please contact night-coverage [email or web address] Password [facility]  08/25/2017, 12:52 PM

## 2017-08-25 NOTE — Care Management Obs Status (Signed)
[facility] NOTIFICATION   Patient Details  Name: Roselynn Whitacre MRN: [id number] Date of Birth: [birth year]   Medicare Observation Status Notification Given:  Yes    Eurydice Calixto, Benjaman Lobe, RN 08/25/2017, 12:42 PM

## 2017-08-25 NOTE — ED Notes (Signed)
Hospitalist Dr. Shanon Brow made aware of patient's critical lactic acid of 2.38.

## 2017-08-25 NOTE — Progress Notes (Signed)
   08/25/17 1200  Clinical Encounter Type  Visited With Patient and family together;Health care provider  Visit Type Code  Referral From Nurse  Consult/Referral To Chaplain  Stress Factors  Family Stress Factors Health changes   Around 3 Am responded to a Code Blue in the cancer center lab.  Upon arriving staff were assisting the patient and daughter at the bedside.  Patient was taken to ED.  Followed along offering support to the daughter.  Was able to get to know the daughter and some about her mother, the patient.  Offered support and hospitality.  Will follow as needed. Chaplain Katherene Ponto

## 2017-08-25 NOTE — Progress Notes (Signed)
Patient gave permission for daughter to be in room during the admission process questioning

## 2017-08-25 NOTE — ED Notes (Signed)
Cancer center nurse practitioner at the bedside.

## 2017-08-25 NOTE — ED Notes (Signed)
ED Provider at bedside. 

## 2017-08-26 ENCOUNTER — Encounter (HOSPITAL_COMMUNITY): Payer: Self-pay | Admitting: Radiology

## 2017-08-26 ENCOUNTER — Observation Stay (HOSPITAL_BASED_OUTPATIENT_CLINIC_OR_DEPARTMENT_OTHER): Payer: Medicare HMO

## 2017-08-26 ENCOUNTER — Observation Stay (HOSPITAL_COMMUNITY): Payer: Medicare HMO

## 2017-08-26 DIAGNOSIS — I10 Essential (primary) hypertension: Secondary | ICD-10-CM | POA: Diagnosis not present

## 2017-08-26 DIAGNOSIS — Z171 Estrogen receptor negative status [ER-]: Secondary | ICD-10-CM | POA: Diagnosis not present

## 2017-08-26 DIAGNOSIS — I5042 Chronic combined systolic (congestive) and diastolic (congestive) heart failure: Secondary | ICD-10-CM

## 2017-08-26 DIAGNOSIS — R55 Syncope and collapse: Secondary | ICD-10-CM | POA: Diagnosis not present

## 2017-08-26 DIAGNOSIS — J449 Chronic obstructive pulmonary disease, unspecified: Secondary | ICD-10-CM

## 2017-08-26 DIAGNOSIS — C50512 Malignant neoplasm of lower-outer quadrant of left female breast: Secondary | ICD-10-CM | POA: Diagnosis not present

## 2017-08-26 DIAGNOSIS — I420 Dilated cardiomyopathy: Secondary | ICD-10-CM

## 2017-08-26 LAB — BASIC METABOLIC PANEL
Anion gap: 5 (ref 5–15)
BUN: 12 mg/dL (ref 6–20)
CO2: 24 mmol/L (ref 22–32)
Calcium: 8.6 mg/dL — ABNORMAL LOW (ref 8.9–10.3)
Chloride: 108 mmol/L (ref 101–111)
Creatinine, Ser: 0.87 mg/dL (ref 0.44–1.00)
GFR calc Af Amer: >60 mL/min (ref >60)
GLUCOSE: 200 mg/dL — AB (ref 65–99)
POTASSIUM: 4 mmol/L (ref 3.5–5.1)
Sodium: 137 mmol/L (ref 135–145)

## 2017-08-26 LAB — GLUCOSE, CAPILLARY: GLUCOSE-CAPILLARY: 85 mg/dL (ref 65–99)

## 2017-08-26 LAB — CBC
HEMATOCRIT: 29.9 % — AB (ref 36.0–46.0)
Hemoglobin: 9.9 g/dL — ABNORMAL LOW (ref 12.0–15.0)
MCH: 30.7 pg (ref 26.0–34.0)
MCHC: 33.1 g/dL (ref 30.0–36.0)
MCV: 92.6 fL (ref 78.0–100.0)
Platelets: 215 10*3/uL (ref 150–400)
RBC: 3.23 MIL/uL — ABNORMAL LOW (ref 3.87–5.11)
RDW: 15.3 % (ref 11.5–15.5)
WBC: 23.2 10*3/uL — ABNORMAL HIGH (ref 4.0–10.5)

## 2017-08-26 LAB — TROPONIN I

## 2017-08-26 LAB — ECHOCARDIOGRAM COMPLETE
HEIGHTINCHES: 67 in
WEIGHTICAEL: 3707.26 [oz_av]

## 2017-08-26 LAB — LACTIC ACID, PLASMA: Lactic Acid, Venous: 1.3 mmol/L (ref 0.5–1.9)

## 2017-08-26 MED ORDER — HEPARIN SOD (PORK) LOCK FLUSH 100 UNIT/ML IV SOLN
500.0000 [IU] | INTRAVENOUS | Status: AC | PRN
  Administered 2017-08-26: 500 [IU]

## 2017-08-26 NOTE — Care Management Note (Signed)
Case Management Note  Patient Details  Name: Randy Whitener MRN: [id number] Date of Birth: [birth year]  Subjective/Objective:                  Syncopal episode at local md office.  Action/Plan: Date:  August 26, 2017 Chart reviewed for concurrent status and case management needs.  Will continue to follow patient progress.  Discharge Planning: following for needs.  None present at this time of review. Expected discharge date: August 29 2017 Velva Harman, BSN, [place], [place]   Expected Discharge Date:  (unknown)               Expected Discharge Plan:  Home/Self Care  In-House Referral:     Discharge planning Services  CM Consult  Post Acute Care Choice:    Choice offered to:     DME Arranged:    DME Agency:     HH Arranged:    HH Agency:     Status of Service:  In process, will continue to follow  If discussed at Long Length of Stay Meetings, dates discussed:    Additional Comments:  Leeroy Cha, RN 08/26/2017, 9:25 AM

## 2017-08-26 NOTE — Discharge Instructions (Signed)
Follow with Raenette Rover, Vickie L, NP-C in 1-2 weeks  Please get a complete blood count and chemistry panel checked by your Primary MD at your next visit, and again as instructed by your Primary MD. Please get your medications reviewed and adjusted by your Primary MD.  Please request your Primary MD to go over all Hospital Tests and Procedure/Radiological results at the follow up, please get all Hospital records sent to your Prim MD by signing hospital release before you go home.  If you had Pneumonia of Lung problems at the Hospital: Please get a 2 view Chest X ray done in 6-8 weeks after hospital discharge or sooner if instructed by your Primary MD.  If you have Congestive Heart Failure: Please call your Cardiologist or Primary MD anytime you have any of the following symptoms:  1) 3 pound weight gain in 24 hours or 5 pounds in 1 week  2) shortness of breath, with or without a dry hacking cough  3) swelling in the hands, feet or stomach  4) if you have to sleep on extra pillows at night in order to breathe  Follow cardiac low salt diet and 1.5 lit/day fluid restriction.  If you have diabetes Accuchecks 4 times/day, Once in AM empty stomach and then before each meal. Log in all results and show them to your primary doctor at your next visit. If any glucose reading is under 80 or above 300 call your primary MD immediately.  If you have Seizure/Convulsions/Epilepsy: Please do not drive, operate heavy machinery, participate in activities at heights or participate in high speed sports until you have seen by Primary MD or a Neurologist and advised to do so again.  If you had Gastrointestinal Bleeding: Please ask your Primary MD to check a complete blood count within one week of discharge or at your next visit. Your endoscopic/colonoscopic biopsies that are pending at the time of discharge, will also need to followed by your Primary MD.  Get Medicines reviewed and adjusted. Please take all your  medications with you for your next visit with your Primary MD  Please request your Primary MD to go over all hospital tests and procedure/radiological results at the follow up, please ask your Primary MD to get all Hospital records sent to his/her office.  If you experience worsening of your admission symptoms, develop shortness of breath, life threatening emergency, suicidal or homicidal thoughts you must seek medical attention immediately by calling 911 or calling your MD immediately  if symptoms less severe.  You must read complete instructions/literature along with all the possible adverse reactions/side effects for all the Medicines you take and that have been prescribed to you. Take any new Medicines after you have completely understood and accpet all the possible adverse reactions/side effects.   Do not drive or operate heavy machinery when taking Pain medications.   Do not take more than prescribed Pain, Sleep and Anxiety Medications  Special Instructions: If you have smoked or chewed Tobacco  in the last 2 yrs please stop smoking, stop any regular Alcohol  and or any Recreational drug use.  Wear Seat belts while driving.  Please note You were cared for by a hospitalist during your hospital stay. If you have any questions about your discharge medications or the care you received while you were in the hospital after you are discharged, you can call the unit and asked to speak with the hospitalist on call if the hospitalist that took care of you is not available.  Once you are discharged, your primary care physician will handle any further medical issues. Please note that NO REFILLS for any discharge medications will be authorized once you are discharged, as it is imperative that you return to your primary care physician (or establish a relationship with a primary care physician if you do not have one) for your aftercare needs so that they can reassess your need for medications and monitor your  lab values.  You can reach the hospitalist office at phone [phone] or fax [phone]   If you do not have a primary care physician, you can call [phone] for a physician referral.  Activity: As tolerated with Full fall precautions use walker/cane & assistance as needed  Diet: regular  Disposition Home

## 2017-08-26 NOTE — Progress Notes (Addendum)
Deleted note due to being incorrect patient.

## 2017-08-26 NOTE — Progress Notes (Signed)
  Echocardiogram 2D Echocardiogram has been performed.  Kristin Ward 08/26/2017, 9:07 AM

## 2017-08-26 NOTE — Discharge Summary (Signed)
Physician Discharge Summary  Kristin Ward NTZ:[id number] DOB: [birth year] DOA: 08/25/2017  PCP: Girtha Rm, NP-C  Admit date: 08/25/2017 Discharge date: 08/26/2017  Admitted From: home Disposition:  home  Recommendations for Outpatient Follow-up:  1. Follow up with PCP in 1-2 weeks 2. Follow up with Oncology as scheduled  Home Health: none Equipment/Devices: none  Discharge Condition: stable CODE STATUS: Full code Diet recommendation: regular  HPI: Per Dr. Shanon Brow, Kristin Ward is a 62 y.o. 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  Pt reports she was feeling fine.  She does have some nausea and diarrhea associated with her chemo and she tries to stay well hydrated.  She denies any fevers.  No cough.  No sob.  No chest pain.  No swelling in legs or pain in legs.  No urinary symptoms.  She does not recall what happened prior to her syncope.  She just remembers going to sit in the lab chair.  She passed out while sitting there, her fall was controlled by staff so she did not have any head trauma.  A code blue was called. EDP dr Wilson Singer responded to code blue and reported pt never lost a pulse.  Her sbp was in the 50s.  She came too quickly and after she was lying for a couple minutes her sbp returned to over 100.  She did not have any siezure like activity reported nor postictal state.  Pt has been in ED now, with normal on the low end bp and otherwise normal vitals.  She denies anything at this time and feels normal.  She denies any focal numbness, tingling or weakness.  She is blind at baseline.  She is referred for admission for syncopal episode.    Hospital Course: Syncope -patient was admitted to the  hospital with a syncopal episode while in the cancer center.  She got up from her chair, was walking towards the table to get the infusion, she felt lightheaded and dizzy, and upon sitting on the table she passed out.  She was admitted to the hospital for syncopal workup.  She was monitored on telemetry without any significant arrhythmias.  Cardiac enzymes were negative x3.  She underwent a repeat 2D echo which showed an ejection fraction of 45-50%, improved from prior echoes.  D-dimer was negative.  She received IV fluids, she had negative orthostatic vital signs.  CT scan of the brain was unremarkable.  She had no infectious causes, no fever, and cultures remained negative.  Chest x-ray was unremarkable.  She returned to baseline, her syncope was probably vasovagal/orthostatic hypotension, will be discharged home in stable condition with outpatient follow-up.  She had negative orthostatic vital signs on discharge. Chronic combined systolic and diastolic CHF -euvolemic, 2D echo as below HTN - will hold Norvasc on d/c, keep on Entresto Retinitis pigmentosa /legally blind- managed as outpatient COPD - stable Breast cancer -managed as an outpatient   Discharge Diagnoses:  Principal Problem:   Syncope Active Problems:   Chronic combined systolic and diastolic heart failure (HCC)   Essential hypertension   Retinitis pigmentosa   COPD (chronic obstructive pulmonary disease) ([place])   Dilated cardiomyopathy ([place])   Malignant neoplasm of lower-outer  quadrant of left breast of female, estrogen receptor negative ([place])   Blind in both eyes     Discharge Instructions   Allergies as of 08/26/2017      Reactions   Latex Hives, Itching   Burning    Tylenol With Codeine #3 [acetaminophen-codeine] Nausea And Vomiting   Penicillins Nausea Only, Swelling, Rash   Has patient had a PCN reaction causing immediate rash, facial/tongue/throat swelling, SOB or lightheadedness with hypotension: Yes Has patient  had a PCN reaction causing severe rash involving mucus membranes or skin necrosis: Yes Has patient had a PCN reaction that required hospitalization: No Has patient had a PCN reaction occurring within the last 10 years: No TONGUE SWELLING AND RASH AROUND MOUTH If all of the above answers are "NO", then may proceed with Cephalosporin use.      Medication List    STOP taking these medications   amLODipine 5 MG tablet Commonly known as:  NORVASC     TAKE these medications   aspirin 81 MG chewable tablet Chew 81 mg by mouth daily.   carvedilol 12.5 MG tablet Commonly known as:  COREG Take 1 tablet (12.5 mg total) by mouth 2 (two) times daily with a meal.   dexamethasone 4 MG tablet Commonly known as:  DECADRON Take 2 tablets (8 mg total) by mouth 2 (two) times daily. Start the day before Taxotere. Then again the day after chemo for 3 days.   doxycycline 100 MG tablet Commonly known as:  VIBRA-TABS Take 100 mg by mouth 2 (two) times daily.   furosemide 40 MG tablet Commonly known as:  LASIX Take 1 tablet (40 mg total) by mouth daily.   lidocaine-prilocaine cream Commonly known as:  EMLA Apply to affected area once   naproxen 500 MG tablet Commonly known as:  NAPROSYN Take 1 tablet (500 mg total) by mouth daily as needed for mild pain.   ofloxacin 0.3 % ophthalmic solution Commonly known as:  OCUFLOX INSTILL 1 DROP INTO BOTH EYES 3 TIMES A DAY   ondansetron 8 MG tablet Commonly known as:  ZOFRAN Take 1 tablet (8 mg total) by mouth 2 (two) times daily as needed for refractory nausea / vomiting. Start on day 3 after chemo.   oxyCODONE 5 MG immediate release tablet Commonly known as:  Oxy IR/ROXICODONE Take 1-2 tablets (5-10 mg total) by mouth every 6 (six) hours as needed for moderate pain, severe pain or breakthrough pain.   predniSONE 10 MG tablet Commonly known as:  DELTASONE Take 10 mg by mouth daily.   prochlorperazine 10 MG tablet Commonly known as:   COMPAZINE Take 1 tablet (10 mg total) by mouth every 6 (six) hours as needed (Nausea or vomiting).   sacubitril-valsartan 24-26 MG Commonly known as:  ENTRESTO Take 1 tablet by mouth 2 (two) times daily.   spironolactone 25 MG tablet Commonly known as:  ALDACTONE Take 1 tablet (25 mg total) by mouth daily.   valACYclovir 1000 MG tablet Commonly known as:  VALTREX Take 1 tablet (1,000 mg total) by mouth daily. What changed:    when to take this  reasons to take this   vitamin C 500 MG tablet Commonly known as:  ASCORBIC ACID Take 1,000 mg 2 (two) times daily by mouth.      Follow-up Information    Henson, Vickie L, NP-C. Schedule an appointment as soon as possible for a visit in 1 week(s).   Specialty:  Family Medicine Contact information: [address]  [place] [address] [phone]           Consultations:  None   Procedures/Studies:  2D echo  Impressions:  - Normal LV size with mild LV hypertrophy. EF 45-50%, diffuse   hypokinesis. Normal RV size and systolic function. No significant   valvular abnormalities.  Dg Chest 2 View  Result Date: 08/25/2017 CLINICAL DATA:  Is syncope. EXAM: CHEST  2 VIEW COMPARISON:  Chest x-rays dated 06/26/2017 and 10/09/2015 FINDINGS: The heart size and mediastinal contours are within normal limits. Both lungs are clear. The visualized skeletal structures are unremarkable. Power port in place, unchanged.  Surgical clips in the left axilla. IMPRESSION: No active cardiopulmonary disease. Electronically Signed   By: Lorriane Shire M.D.   On: 08/25/2017 10:22   Ct Head Wo Contrast  Result Date: 08/26/2017 CLINICAL DATA:  Syncopal episode yesterday. She did not hit her head. EXAM: CT HEAD WITHOUT CONTRAST TECHNIQUE: Contiguous axial images were obtained from the base of the skull through the vertex without intravenous contrast. COMPARISON:  None. FINDINGS: Brain: No evidence of acute infarction, hemorrhage, hydrocephalus,  extra-axial collection or mass lesion/mass effect. Vascular: No hyperdense vessel or unexpected calcification. Skull: No osseous abnormality. Sinuses/Orbits: Evidence of prior sinus surgery partially visualized. Visualized mastoid sinuses are clear. Visualized orbits demonstrate no focal abnormality. Other: None IMPRESSION: No acute intracranial pathology. Electronically Signed   By: Kathreen Devoid   On: 08/26/2017 10:55      Subjective: - no chest pain, shortness of breath, no abdominal pain, nausea or vomiting.   Discharge Exam: Vitals:   08/26/17 1200 08/26/17 1616  BP:    Pulse:    Resp:    Temp: 97.8 F (36.6 C) 98.5 F (36.9 C)  SpO2:      General: Pt is alert, awake, not in acute distress Cardiovascular: RRR, S1/S2 +, no rubs, no gallops Respiratory: CTA bilaterally, no wheezing, no rhonchi Abdominal: Soft, NT, ND, bowel sounds + Extremities: no edema, no cyanosis    The results of significant diagnostics from this hospitalization (including imaging, microbiology, ancillary and laboratory) are listed below for reference.     Microbiology: Recent Results (from the past 240 hour(s))  MRSA PCR Screening     Status: None   Collection Time: 08/25/17  4:52 PM  Result Value Ref Range Status   MRSA by PCR NEGATIVE NEGATIVE Final    Comment:        The GeneXpert MRSA Assay (FDA approved for NASAL specimens only), is one component of a comprehensive MRSA colonization surveillance program. It is not intended to diagnose MRSA infection nor to guide or monitor treatment for MRSA infections.      Labs: BNP (last 3 results) No results for input(s): BNP in the last 8760 hours. Basic Metabolic Panel: Recent Labs  Lab 08/25/17 0923 08/26/17 0405  NA 138 137  K 3.6 4.0  CL 106 108  CO2 23 24  GLUCOSE 151* 200*  BUN 12 12  CREATININE 0.89 0.87  CALCIUM 9.2 8.6*  MG 1.8  --    Liver Function Tests: No results for input(s): AST, ALT, ALKPHOS, BILITOT, PROT, ALBUMIN  in the last 168 hours. No results for input(s): LIPASE, AMYLASE in the last 168 hours. No results for input(s): AMMONIA in the last 168 hours. CBC: Recent Labs  Lab 08/25/17 0923 08/26/17 0405  WBC 11.4* 23.2*  NEUTROABS 7.2  --   HGB 10.9* 9.9*  HCT 32.9* 29.9*  MCV 92.4 92.6  PLT 246 215  Cardiac Enzymes: Recent Labs  Lab 08/25/17 0923 08/25/17 1645 08/25/17 2311 08/26/17 0405  TROPONINI <0.03 <0.03 <0.03 <0.03   BNP: Invalid input(s): POCBNP CBG: Recent Labs  Lab 08/25/17 1451 08/26/17 0741  GLUCAP 232* 85   D-Dimer Recent Labs    08/25/17 0923  DDIMER 0.43   Hgb A1c No results for input(s): HGBA1C in the last 72 hours. Lipid Profile No results for input(s): CHOL, HDL, LDLCALC, TRIG, CHOLHDL, LDLDIRECT in the last 72 hours. Thyroid function studies No results for input(s): TSH, T4TOTAL, T3FREE, THYROIDAB in the last 72 hours.  Invalid input(s): FREET3 Anemia work up No results for input(s): VITAMINB12, FOLATE, FERRITIN, TIBC, IRON, RETICCTPCT in the last 72 hours. Urinalysis No results found for: COLORURINE, APPEARANCEUR, LABSPEC, [place], GLUCOSEU, HGBUR, BILIRUBINUR, KETONESUR, PROTEINUR, UROBILINOGEN, NITRITE, LEUKOCYTESUR Sepsis Labs Invalid input(s): PROCALCITONIN,  WBC,  LACTICIDVEN   Time coordinating discharge: 40 minutes  SIGNED:  Marzetta Board, MD  [facility] 08/26/2017, 5:45 PM Pager [phone]  If 7PM-7AM, please contact night-coverage [email or web address] Password [email or web address]

## 2017-08-26 NOTE — Progress Notes (Signed)
Pt education completed, all care plan goals met. Pt dressed and waiting for ride. IV team notified to deaccess port. Pt has possession of all belongings.

## 2017-08-29 ENCOUNTER — Encounter: Payer: Self-pay | Admitting: Medical

## 2017-08-29 ENCOUNTER — Ambulatory Visit (INDEPENDENT_AMBULATORY_CARE_PROVIDER_SITE_OTHER): Payer: Medicare HMO | Admitting: Medical

## 2017-08-29 VITALS — BP 118/86 | HR 91 | Wt 241.4 lb

## 2017-08-29 DIAGNOSIS — Z79899 Other long term (current) drug therapy: Secondary | ICD-10-CM

## 2017-08-29 DIAGNOSIS — R5383 Other fatigue: Secondary | ICD-10-CM

## 2017-08-29 DIAGNOSIS — I5042 Chronic combined systolic (congestive) and diastolic (congestive) heart failure: Secondary | ICD-10-CM

## 2017-08-29 DIAGNOSIS — H3552 Pigmentary retinal dystrophy: Secondary | ICD-10-CM

## 2017-08-29 DIAGNOSIS — C50512 Malignant neoplasm of lower-outer quadrant of left female breast: Secondary | ICD-10-CM

## 2017-08-29 DIAGNOSIS — H543 Unqualified visual loss, both eyes: Secondary | ICD-10-CM | POA: Diagnosis not present

## 2017-08-29 DIAGNOSIS — R55 Syncope and collapse: Secondary | ICD-10-CM | POA: Diagnosis not present

## 2017-08-29 DIAGNOSIS — Z171 Estrogen receptor negative status [ER-]: Secondary | ICD-10-CM

## 2017-08-29 DIAGNOSIS — J449 Chronic obstructive pulmonary disease, unspecified: Secondary | ICD-10-CM | POA: Diagnosis not present

## 2017-08-29 DIAGNOSIS — R7301 Impaired fasting glucose: Secondary | ICD-10-CM

## 2017-08-29 DIAGNOSIS — D649 Anemia, unspecified: Secondary | ICD-10-CM

## 2017-08-29 DIAGNOSIS — I10 Essential (primary) hypertension: Secondary | ICD-10-CM | POA: Diagnosis not present

## 2017-08-29 DIAGNOSIS — I420 Dilated cardiomyopathy: Secondary | ICD-10-CM | POA: Diagnosis not present

## 2017-08-29 NOTE — Progress Notes (Signed)
Subjective: Chief Complaint  Patient presents with  . other    hospital follow from passing out    She is here today with her daughter.  She was hospitalized August 25, 2017 through August 26, 2017 for syncope, combined chronic systolic and diastolic heart failure, hypertension, retinitis pigmentosa, COPD, dilated cardiomyopathy, left breast cancer, blindness.  She denies any other syncope or other unusual symptoms since coming home from the hospital.  She thinks she had been running low on her blood pressure since starting chemotherapy.     Otherwise she was in usual state of health.  She would like labs to look at her vitamins and nutrients today  She has follow-up soon with her hematologist/oncologist  She notes that she had a DO NOT RESUSCITATE form on file but after speaking with her children she would like to remove the DNR  Past Medical History:  Diagnosis Date  . Abnormal myocardial perfusion study 03/15/2016   EF 15%, severe LV systolic dysfcn, global hypokinesis and inferior akinesis. dilated LV  . Abnormal x-ray of lungs with single pulmonary nodule 03/15/2016   per records from [place]   . Blind   . Cholelithiasis 05/19/2017   On CT  . COPD (chronic obstructive pulmonary disease) ([place])    PT DENIES  . Coronary artery calcification seen on CAT scan 05/19/2017  . 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 Diagnosed in [facility]  . Family history of colon cancer   . Genetic testing 06/19/2017   Multi-Cancer panel (83 genes) @ [facility] - No pathogenic mutations detected  . Headache    NONE RECENT  . Hypertension   . Malignant neoplasm of lower-outer quadrant of left breast of female, estrogen receptor negative ([place]) 06/11/2017   left breast  . Retinitis pigmentosa   . Solitary pulmonary nodule on lung CT 03/16/2016   per records from [place]  . Uveitis    Current Outpatient Medications on File Prior to Visit  Medication  Sig Dispense Refill  . aspirin 81 MG chewable tablet Chew 81 mg by mouth daily.    . carvedilol (COREG) 12.5 MG tablet Take 1 tablet (12.5 mg total) by mouth 2 (two) times daily with a meal. 60 tablet 5  . dexamethasone (DECADRON) 4 MG tablet Take 2 tablets (8 mg total) by mouth 2 (two) times daily. Start the day before Taxotere. Then again the day after chemo for 3 days. 30 tablet 1  . lidocaine-prilocaine (EMLA) cream Apply to affected area once 30 g 3  . naproxen (NAPROSYN) 500 MG tablet Take 1 tablet (500 mg total) by mouth daily as needed for mild pain. 20 tablet 1  . ondansetron (ZOFRAN) 8 MG tablet Take 1 tablet (8 mg total) by mouth 2 (two) times daily as needed for refractory nausea / vomiting. Start on day 3 after chemo. 30 tablet 1  . oxyCODONE (OXY IR/ROXICODONE) 5 MG immediate release tablet Take 1-2 tablets (5-10 mg total) by mouth every 6 (six) hours as needed for moderate pain, severe pain or breakthrough pain. 20 tablet 0  . prednisoLONE acetate (PRED FORTE) 1 % ophthalmic suspension INSTILL 1 DROP INTO BOTH EYES 4 TIMES A DAY  1  . predniSONE (DELTASONE) 10 MG tablet Take 10 mg by mouth daily.   0  . sacubitril-valsartan (ENTRESTO) 24-26 MG Take 1 tablet by mouth 2 (two) times daily. 60 tablet 5  . spironolactone (ALDACTONE) 25 MG tablet Take 1 tablet (25 mg total) by mouth daily. [place]  tablet 5  . valACYclovir (VALTREX) 1000 MG tablet Take 1 tablet (1,000 mg total) by mouth daily. (Patient taking differently: Take 1,000 mg by mouth daily as needed (herpes genitalis). ) 7 tablet 6  . vitamin C (ASCORBIC ACID) 500 MG tablet Take 1,000 mg 2 (two) times daily by mouth.    . doxycycline (VIBRA-TABS) 100 MG tablet Take 100 mg by mouth 2 (two) times daily.  0  . furosemide (LASIX) 40 MG tablet Take 1 tablet (40 mg total) by mouth daily. (Patient not taking: Reported on 08/25/2017) 30 tablet 5  . ofloxacin (OCUFLOX) 0.3 % ophthalmic solution INSTILL 1 DROP INTO BOTH EYES 3 TIMES A DAY  0  .  prochlorperazine (COMPAZINE) 10 MG tablet Take 1 tablet (10 mg total) by mouth every 6 (six) hours as needed (Nausea or vomiting). (Patient not taking: Reported on 08/29/2017) 30 tablet 1   No current facility-administered medications on file prior to visit.    ROS as in subjective   Objective: BP 118/86 (BP Location: Right Arm, Patient Position: Sitting)   Pulse 91   Wt 241 lb 6.4 oz (109.5 kg)   SpO2 98%   BMI 37.81 kg/m   General appearance: alert, no distress, WD/WN,  HEENT: normocephalic, sclerae anicteric, TMs pearly, nares patent, no discharge or erythema, pharynx normal Oral cavity: MMM, no lesions Neck: supple, no lymphadenopathy, no thyromegaly, no masses, no bruits Heart: RRR, normal S1, S2, no murmurs Lungs: CTA bilaterally, no wheezes, rhonchi, or rales Abdomen: +bs, soft, non tender, non distended, no masses, no hepatomegaly, no splenomegaly Pulses: 2+ symmetric, upper and lower extremities, normal cap refill Ext: no edema    Assessment: Encounter Diagnoses  Name Primary?  . Syncope, unspecified syncope type Yes  . Essential hypertension   . Chronic combined systolic and diastolic heart failure ([place])   . Dilated cardiomyopathy ([place])   . Chronic obstructive pulmonary disease, unspecified COPD type ([place])   . Blind in both eyes   . Malignant neoplasm of lower-outer quadrant of left breast of female, estrogen receptor negative ([place])   . Retinitis pigmentosa   . Other fatigue   . Anemia, unspecified type   . Impaired fasting blood sugar   . High risk medication use     Plan: I reviewed her hospital discharge summary, labs, imaging, findings.  Reviewed the medication reconciliation  She has not had any more syncope since the hospitalization  Her amlodipine was held at time of discharge, due to blood pressure drops.  She will monitor her blood pressures.  For now do not restart amlodipine  Her hemoglobin has been trending down so this will need to be monitored  as she has been complaining of fatigue  We had planned to do some blood work today, but she apparently left after I had left the room and told the phlebotomist that she would be back Monday to do the labs  Regarding her request to remove the DNR status, I talked to our front office about forms to do this.  we will check on this.   Pearlee was seen today for other.  Diagnoses and all orders for this visit:  Syncope, unspecified syncope type  Essential hypertension -     TSH -     Basic metabolic panel  Chronic combined systolic and diastolic heart failure (HCC)  Dilated cardiomyopathy ([place])  Chronic obstructive pulmonary disease, unspecified COPD type ([place])  Blind in both eyes  Malignant neoplasm of lower-outer quadrant of left breast of  female, estrogen receptor negative ([place])  Retinitis pigmentosa  Other fatigue -     VITAMIN D 25 Hydroxy (Vit-D Deficiency, Fractures) -     CBC -     TSH -     B12 and Folate Panel -     Vitamin B1 -     Vitamin B6 -     Basic metabolic panel  Anemia, unspecified type -     VITAMIN D 25 Hydroxy (Vit-D Deficiency, Fractures) -     CBC -     TSH -     Hemoglobin A1c -     B12 and Folate Panel -     Vitamin B1 -     Vitamin B6 -     Basic metabolic panel  Impaired fasting blood sugar  High risk medication use   F/u pending labs

## 2017-09-03 DIAGNOSIS — H35353 Cystoid macular degeneration, bilateral: Secondary | ICD-10-CM | POA: Diagnosis not present

## 2017-09-03 DIAGNOSIS — H16043 Marginal corneal ulcer, bilateral: Secondary | ICD-10-CM | POA: Diagnosis not present

## 2017-09-03 DIAGNOSIS — H43812 Vitreous degeneration, left eye: Secondary | ICD-10-CM | POA: Diagnosis not present

## 2017-09-03 DIAGNOSIS — H3552 Pigmentary retinal dystrophy: Secondary | ICD-10-CM | POA: Diagnosis not present

## 2017-09-03 DIAGNOSIS — H2513 Age-related nuclear cataract, bilateral: Secondary | ICD-10-CM | POA: Diagnosis not present

## 2017-09-03 DIAGNOSIS — H20013 Primary iridocyclitis, bilateral: Secondary | ICD-10-CM | POA: Diagnosis not present

## 2017-09-03 NOTE — Progress Notes (Signed)
[facility]  [phone] [phone]  Clinic Follow up Note   Patient Care Team: Girtha Rm, NP-C as PCP - General (Family Medicine) [name], MD as Consulting Physician (Hematology) [name], MD as Consulting Physician (General Surgery)   Date of Service:  09/08/2017  CHIEF COMPLAINTS:  Malignant neoplasm of lower-outer quadrant of left breast of female, triple negative    Oncology History   Cancer Staging Malignant neoplasm of lower-outer quadrant of left breast of female, estrogen receptor negative ([place]) Staging form: Breast, AJCC 8th Edition - Clinical stage from 06/06/2017: Stage IB (cT1c, cN0, cM0, G3, ER: Negative, PR: Negative, HER2: Negative) - Signed by [name], MD on 06/15/2017 - Pathologic stage from 06/26/2017: Stage IB (pT1c, pN0, cM0, G3, ER: Negative, PR: Negative, HER2: Negative) - Signed by [name], MD on 07/10/2017       Malignant neoplasm of lower-outer quadrant of left breast of female, estrogen receptor negative ([place])   05/30/2017 Mammogram    Diagnostic mammo and US IMPRESSION: 1. Highly suspicious 1.4 cm mass in the slightly lower slightly outer left breast -tissue sampling recommended. 2. Indeterminate 0.5 mm mass in the slightly lower slightly outer left breast -tissue sampling recommended. 3. At least 2 left axillary lymph nodes with borderline cortical thickness.      06/06/2017 Initial Biopsy    Diagnosis 1. Breast, left, needle core biopsy, 5:30 o'clock - INVASIVE DUCTAL CARCINOMA, G3 2. Lymph node, needle/core biopsy, left axillary - NO CARCINOMA IDENTIFIED IN ONE LYMPH NODE (0/1)      06/06/2017 Initial Diagnosis    Malignant neoplasm of lower-outer quadrant of left breast of female, estrogen receptor negative ([place])      06/06/2017 Receptors her2    Estrogen Receptor: 0%, NEGATIVE Progesterone Receptor: 0%, NEGATIVE Proliferation Marker Ki67: 70%      06/26/2017 Surgery    LEFT  BREAST LUMPECTOMY WITH RADIOACTIVE SEED AND SENTINEL LYMPH NODE BIOPSY ERAS  PATHWAY AND INSERTION PORT-A-CATH By Dr. Barry Dienes on 06/26/17       06/26/2017 Pathology Results    Diagnosis 06/26/17  1. Breast, lumpectomy, Left - INVASIVE DUCTAL CARCINOMA, GRADE III/III, SPANNING 1.2 CM. - THE SURGICAL RESECTION MARGINS ARE NEGATIVE FOR CARCINOMA. - SEE ONCOLOGY TABLE BELOW. 2. Lymph node, sentinel, biopsy, Left axillary #1 - THERE IS NO EVIDENCE OF CARCINOMA IN 1 OF 1 LYMPH NODE (0/1). 3. Lymph node, sentinel, biopsy, Left axillary #2 - THERE IS NO EVIDENCE OF CARCINOMA IN 1 OF 1 LYMPH NODE (0/1). 4. Lymph node, sentinel, biopsy, Left axillary #3 - THERE IS NO EVIDENCE OF CARCINOMA IN 1 OF 1 LYMPH NODE (0/1).       07/25/2017 -  Chemotherapy    CT every 3 weekd for 4 cycles plan to start on 07/25/17         HISTORY OF PRESENTING ILLNESS: 06/18/17 Kristin Ward 62 y.o. female is here because of newly diagnosed left breast cancer. She presents to Breast Clinic today accompanied by her daughter.   She found this mass by screening mammogram.  She denies palpable breast mass before biopsy.  She thinks her current palpable mass is a keloid post biopsy.  She denies any other new symptoms.  No change of her energy level, weight, or appetite.  In the past she had HTN, possible COPD, and her cousin had breast cancer and her aunt had colon cancer. She quit smoking after 23 years She smoked a pack a week. She is can see moderately  and has had cornea surgery. She walks with a cane. She had retinitis pigmentosa She has medication for this. She has CHF and sees a cardiologist and is on medication, Norvasc and coreg. She had lung nodules last year and she had it monitored.    GYN HISTORY  Menarchal: 11 LMP: 2015 Contraceptive: yes HRT: No G2P2: one son and one daughter    CURRENT THERAPY: adjuvant chemo TC every 3 weeks for 4 cycles starting 07/25/17   INTERVAL HISTORY:  Kristin Shippee  Ward is here for a follow up and 3rd cycle TC. She presents to the clinic today accompanied by her daughter.   Of note since last visit, she had a syncope episode in the infusion room on 08/25/17. She was admitted to the hospital for observation and testing. She reports that this has never happened before and she is not afraid of needles. She notes that after chemo she does have some dizziness, blurred vision and diarrhea for 2 weeks. She does not have any of these symptoms today. She reports that it is hard for her sleep at night once she wakes up. She reports she is no longer taking prednisone.   On review of systems, pt denies any other complaints at this time. Pertinent positives are listed and detailed within the above HPI.   MEDICAL HISTORY:  Past Medical History:  Diagnosis Date  . Abnormal myocardial perfusion study 03/15/2016   EF 15%, severe LV systolic dysfcn, global hypokinesis and inferior akinesis. dilated LV  . Abnormal x-ray of lungs with single pulmonary nodule 03/15/2016   per records from [place]   . Blind   . Cholelithiasis 05/19/2017   On CT  . COPD (chronic obstructive pulmonary disease) ([place])    PT DENIES  . Coronary artery calcification seen on CAT scan 05/19/2017  . Dilated idiopathic cardiomyopathy ([place]) 12/2015   EF 15-20%. Diagnosed in [facility]  . Family history of colon cancer   . Genetic testing 06/19/2017   Multi-Cancer panel (83 genes) @ [facility] - No pathogenic mutations detected  . Headache    NONE RECENT  . Hypertension   . Malignant neoplasm of lower-outer quadrant of left breast of female, estrogen receptor negative ([place]) 06/11/2017   left breast  . Retinitis pigmentosa   . Solitary pulmonary nodule on lung CT 03/16/2016   per records from [place]  . Uveitis     SURGICAL HISTORY: Past Surgical History:  Procedure Laterality Date  . brain cyst removed  2007   to help with headaches per patient , aspirated   . BREAST  LUMPECTOMY WITH RADIOACTIVE SEED AND SENTINEL LYMPH NODE BIOPSY Left 06/26/2017   Procedure: BREAST LUMPECTOMY WITH RADIOACTIVE SEED AND SENTINEL LYMPH NODE BIOPSY ERAS  PATHWAY;  Surgeon: [name], MD;  Location: [facility];  Service: General;  Laterality: Left;  pec block  . NASAL TURBINATE REDUCTION    . PORTACATH PLACEMENT N/A 06/26/2017   Procedure: INSERTION PORT-A-CATH;  Surgeon: [name], MD;  Location: [facility];  Service: General;  Laterality: N/A;  . TRANSTHORACIC ECHOCARDIOGRAM  12/2015   [facility] May 2017: Mild concentric LVH. Global hypokinesis. GR daily. EF 18% severe LA dilation. Mitral annular dilatation with papillary muscle dysfunction and moderate MR. Dilated IVC consistent with elevated RAP.    SOCIAL HISTORY: Social History   Socioeconomic History  . Marital status: Widowed    Spouse name: Not on file  . Number of children: Not on file  . Years of education: Not on  file  . Highest education level: Not on file  Social Needs  . Financial resource strain: Not on file  . Food insecurity - worry: Not on file  . Food insecurity - inability: Not on file  . Transportation needs - medical: Not on file  . Transportation needs - non-medical: Not on file  Occupational History  . Not on file  Tobacco Use  . Smoking status: Former Smoker    Packs/day: 0.25    Years: 23.00    Pack years: 5.75    Types: Cigarettes    Last attempt to quit: 10/09/2015    Years since quitting: 1.9  . Smokeless tobacco: Never Used  Substance and Sexual Activity  . Alcohol use: No  . Drug use: No  . Sexual activity: No  Other Topics Concern  . Not on file  Social History Narrative   She is a widowed mother of 2 (daughter and son).   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   She moved to [place] apparently back in 2016, but was visiting family in [place] and was diagnosed with Cardiomyopathy.   She is accompanied by her daughter. She walks roughly 1-2 miles a day.     FAMILY HISTORY: Family History  Problem Relation Age of Onset  . Colon cancer Mother 42       deceased 34  . Heart attack Father   . Breast cancer Other 66       2nd cousin on maternal side; currently 37  . Pancreatic cancer Other        2nd cousin once removed on maternal side; deceased 33s    ALLERGIES:  is allergic to latex; tylenol with codeine #3 [acetaminophen-codeine]; and penicillins.  MEDICATIONS:  Current Outpatient Medications  Medication Sig Dispense Refill  . aspirin 81 MG chewable tablet Chew 81 mg by mouth daily.    . carvedilol (COREG) 12.5 MG tablet Take 1 tablet (12.5 mg total) by mouth 2 (two) times daily with a meal. 60 tablet 5  . dexamethasone (DECADRON) 4 MG tablet Take 2 tablets (8 mg total) by mouth 2 (two) times daily. Start the day before Taxotere. Then again the day after chemo for 3 days. 30 tablet 1  . doxycycline (VIBRA-TABS) 100 MG tablet Take 100 mg by mouth 2 (two) times daily.  0  . lidocaine-prilocaine (EMLA) cream Apply to affected area once 30 g 3  . naproxen (NAPROSYN) 500 MG tablet Take 1 tablet (500 mg total) by mouth daily as needed for mild pain. 20 tablet 1  . ofloxacin (OCUFLOX) 0.3 % ophthalmic solution INSTILL 1 DROP INTO BOTH EYES 3 TIMES A DAY  0  . ondansetron (ZOFRAN) 8 MG tablet Take 1 tablet (8 mg total) by mouth 2 (two) times daily as needed for refractory nausea / vomiting. Start on day 3 after chemo. 30 tablet 1  . oxyCODONE (OXY IR/ROXICODONE) 5 MG immediate release tablet Take 1-2 tablets (5-10 mg total) by mouth every 6 (six) hours as needed for moderate pain, severe pain or breakthrough pain. 20 tablet 0  . prednisoLONE acetate (PRED FORTE) 1 % ophthalmic suspension INSTILL 1 DROP INTO BOTH EYES 4 TIMES A DAY  1  . prochlorperazine (COMPAZINE) 10 MG tablet Take 1 tablet (10 mg total) by mouth every 6 (six) hours as needed (Nausea or vomiting). (Patient not taking: Reported on 08/29/2017) 30 tablet 1  . sacubitril-valsartan  (ENTRESTO) 24-26 MG Take 1 tablet by mouth 2 (two) times daily.  60 tablet 5  . valACYclovir (VALTREX) 1000 MG tablet Take 1 tablet (1,000 mg total) by mouth daily. (Patient taking differently: Take 1,000 mg by mouth daily as needed (herpes genitalis). ) 7 tablet 6  . vitamin C (ASCORBIC ACID) 500 MG tablet Take 1,000 mg 2 (two) times daily by mouth.     No current facility-administered medications for this visit.    Facility-Administered Medications Ordered in Other Visits  Medication Dose Route Frequency Provider Last Rate Last Dose  . sodium chloride flush (NS) 0.9 % injection 10 mL  10 mL Intracatheter PRN [name], MD   10 mL at 09/08/17 1607     REVIEW OF SYSTEMS:   Constitutional: Denies fevers, chills or abnormal night sweats Eyes: Denies blurriness of vision, double vision or watery eyes Ears, nose, mouth, throat, and face: Denies mucositis or sore throat (+) seasonal allergy symptoms, sneezing Respiratory: Denies cough, dyspnea or wheezes Cardiovascular: Denies palpitation, chest discomfort or lower extremity swelling Gastrointestinal:  Denies nausea, heartburn (+) diarrhea Skin: Denies abnormal skin rashes Lymphatics: Denies new lymphadenopathy or easy bruising Neurological:Denies numbness, tingling or new weaknesses (+) dizziness, lightheadedness Behavioral/Psych: Mood is stable, no new changes  All other systems were reviewed with the patient and are negative Breast: (+) limited ROM of left arm, right chest soreness from port  PHYSICAL EXAMINATION: ECOG PERFORMANCE STATUS: 1 - Symptomatic but completely ambulatory  Vitals:   09/08/17 1140  BP: (!) 158/86  Pulse: 83  Resp: 20  Temp: 97.9 F (36.6 C)  SpO2: 100%   Filed Weights   09/08/17 1140  Weight: 241 lb 8 oz (109.5 kg)    GENERAL:alert, no distress and comfortable SKIN: skin color, texture, turgor are normal, no rashes or significant lesions EYES: normal, conjunctiva are pink and non-injected, sclera  clear OROPHARYNX:no exudate, no erythema and lips, buccal mucosa, and tongue normal  NECK: supple, thyroid normal size, non-tender, without nodularity LYMPH:  no palpable lymphadenopathy in the cervical, axillary or inguinal LUNGS: clear to auscultation and percussion with normal breathing effort HEART: regular rate & rhythm and no murmurs and no lower extremity edema ABDOMEN:abdomen soft, non-tender and normal bowel sounds Musculoskeletal:no cyanosis of digits and no clubbing  PSYCH: alert & oriented x 3 with fluent speech NEURO: no focal motor/sensory deficits Breast: (+) s/p left lumpectomy: surgical incision in left breast and axilla healed well, no seroma or palpable mass. Right breast exam benign  LABORATORY DATA:  I have reviewed the data as listed CBC Latest Ref Rng & Units 09/08/2017 08/26/2017 08/25/2017  WBC 3.9 - 10.3 K/uL 12.8(H) 23.2(H) 11.4(H)  Hemoglobin 11.6 - 15.9 g/dL 9.9(L) 9.9(L) 10.9(L)  Hematocrit 34.8 - 46.6 % 29.8(L) 29.9(L) 32.9(L)  Platelets 145 - 400 K/uL 455(H) 215 246    CMP Latest Ref Rng & Units 09/08/2017 08/26/2017 08/25/2017  Glucose 70 - 140 mg/dL 159(H) 200(H) 151(H)  BUN 7 - 26 mg/dL _0 Creatinine 0.60 - 1.10 mg/dL 0.78 0.87 0.89  Sodium 136 - 145 mmol/L 140 137 138  Potassium 3.3 - 4.7 mmol/L 4.4 4.0 3.6  Chloride 98 - 109 mmol/L 111(H) 108 106  CO2 22 - 29 mmol/L _1 Calcium 8.4 - 10.4 mg/dL 9.4 8.6(L) 9.2  Total Protein 6.4 - 8.3 g/dL 7.4 - -  Total Bilirubin 0.2 - 1.2 mg/dL 0.4 - -  Alkaline Phos 40 - 150 U/L 103 - -  AST 5 - 34 U/L 16 - -  ALT 0 - 55  U/L 18 - -     PATHOLOGY  Diagnosis 06/26/17  1. Breast, lumpectomy, Left - INVASIVE DUCTAL CARCINOMA, GRADE III/III, SPANNING 1.2 CM. - THE SURGICAL RESECTION MARGINS ARE NEGATIVE FOR CARCINOMA. - SEE ONCOLOGY TABLE BELOW. 2. Lymph node, sentinel, biopsy, Left axillary #1 - THERE IS NO EVIDENCE OF CARCINOMA IN 1 OF 1 LYMPH NODE (0/1). 3. Lymph node, sentinel, biopsy, Left  axillary #2 - THERE IS NO EVIDENCE OF CARCINOMA IN 1 OF 1 LYMPH NODE (0/1). 4. Lymph node, sentinel, biopsy, Left axillary #3 - THERE IS NO EVIDENCE OF CARCINOMA IN 1 OF 1 LYMPH NODE (0/1). Microscopic Comment 1. BREAST, INVASIVE TUMOR Procedure: Seed localized lumpectomy. Laterality: Left. Tumor Size: 1.2 cm (gross measurement). Histologic Type: Ductal. Grade: III. Tubular Differentiation: 3. Nuclear Pleomorphism: 2. Mitotic Count: 3. Ductal Carcinoma in Situ (DCIS): Not identified. Extent of Tumor: Confined to breast parenchyma. Margins: Greater than 0.2 cm to all margins. Regional Lymph Nodes: Number of Lymph Nodes Examined: 3. Number of Sentinel Lymph Nodes Examined: 3. Lymph Nodes with Macrometastases: 0. Lymph Nodes with Micrometastases: 0. Lymph Nodes with Isolated Tumor Cells: 0. Breast Prognostic Profile: Case [id number]. Estrogen Receptor: 0%. 1 of 3 FINAL for Melichar, Sindhu ANN [phone]) Microscopic Comment(continued) Progesterone Receptor: 0%. Her2: No amplification was detected. Ki-67: 70%. Best tumor block for sendout testing: 1A or 1B. Pathologic Stage Classification (pTNM, AJCC 8th Edition): Primary Tumor (pT): pT1c. Regional Lymph Nodes (pN): pN0. Distant Metastases (pM): pMX.   Diagnosis 1. Breast, left, needle core biopsy, 5:30 o'clock - INVASIVE DUCTAL CARCINOMA - SEE COMMENT 2. Lymph node, needle/core biopsy, left axillary - NO CARCINOMA IDENTIFIED IN ONE LYMPH NODE (0/1) - SEE COMMENT Microscopic Comment 1. Based on the biopsy, the carcinoma appears Nottingham grade 3 of 3 and measures 1.2 cm in greatest linear extent. Prognostic markers (ER/PR/ki-67/HER2-FISH) are pending and will be reported in an addendum. Dr. Lyndon Code has reviewed the case and agrees with above diagnosis. These results were called to [facility] on June 10, 2017. 2. Deeper levels were examined. Cytokeratin AE1/3 is negative supporting the above  diagnosis. 1. PROGNOSTIC INDICATORS Results: IMMUNOHISTOCHEMICAL AND MORPHOMETRIC ANALYSIS PERFORMED MANUALLY Estrogen Receptor: 0%, NEGATIVE Progesterone Receptor: 0%, NEGATIVE Proliferation Marker Ki67: 70%   RADIOGRAPHIC STUDIES: I have personally reviewed the radiological images as listed and agreed with the findings in the report. Dg Chest 2 View  Result Date: 08/25/2017 CLINICAL DATA:  Is syncope. EXAM: CHEST  2 VIEW COMPARISON:  Chest x-rays dated 06/26/2017 and 10/09/2015 FINDINGS: The heart size and mediastinal contours are within normal limits. Both lungs are clear. The visualized skeletal structures are unremarkable. Power port in place, unchanged.  Surgical clips in the left axilla. IMPRESSION: No active cardiopulmonary disease. Electronically Signed   By: Lorriane Shire M.D.   On: 08/25/2017 10:22   Ct Head Wo Contrast  Result Date: 08/26/2017 CLINICAL DATA:  Syncopal episode yesterday. She did not hit her head. EXAM: CT HEAD WITHOUT CONTRAST TECHNIQUE: Contiguous axial images were obtained from the base of the skull through the vertex without intravenous contrast. COMPARISON:  None. FINDINGS: Brain: No evidence of acute infarction, hemorrhage, hydrocephalus, extra-axial collection or mass lesion/mass effect. Vascular: No hyperdense vessel or unexpected calcification. Skull: No osseous abnormality. Sinuses/Orbits: Evidence of prior sinus surgery partially visualized. Visualized mastoid sinuses are clear. Visualized orbits demonstrate no focal abnormality. Other: None IMPRESSION: No acute intracranial pathology. Electronically Signed   By: Kathreen Devoid   On: 08/26/2017 10:55  ASSESSMENT & PLAN:  Kristin Ward is a 62 y.o. female with a history of HTN, COPD, retinitis pigmentosa, and CHF with EF 45-50%, presented with screening discovered left breast cancer.   1. 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  I reviewed her surgical pathology findings in great detail, surgical margins were negative.  3 sentinel lymph nodes were negative. -We previously reviewed the risk of cancer recurrence after complete surgical resection, which is based on the stage and biology of cancer. She has early stage but very aggressive disease.  She has high risk for recurrence due to triple negative disease  -Due to the high risk disease, I recommend adjuvant chemotherapy. We discussed adjuvant chemotherapy regimens, due to her cardiomyopathy, she is not a candidate for Adriamycin.  I suggest moderate intense chemo regiment docetaxel and Cytoxan every 2 weeks, for 4 cycles, due to her comorbidity.  Chemo consent obtained.  -I also discussed the clinical trial UPBEAT, to monitor her heart by cardiac MRI before and after chemotherapy. She is interested, unfortunately she was not eligible due to her heart failure. -She will take Dexa 1 day before and 3 days after chemotherapy. I discussed antiemetics and EMLA cream use.  -Labs reviewed, her ANC and WBC are mildly elevated but adequate to proceed with treatment.  -She will start CT every 3 weeks for 4 cycles on 07/25/17. I reviewed risk, benefits and side effects again, and I answered her many questions. -We again discussed Neulasta Onpro and potential bone pain, I advised her to use Claritin for 5 days after day 1 to prevent bone pain. She is fine to use naproxen for the pain as well. -I reviewed her medication, I recommend her to hold her Lasix and amlodipine for now, due to her low normal blood pressure, she will continue Coreg and spironolactone -I encouraged her to contact our clinic if she develops hypotension, dizziness, fever of 100.96F or higher, or any significant or unexpected side effects. -The pt was admitted to the hospital from the ED on 08/25/17 due a syncopal episode while in the cancer  center. It happened in the lab when she sat down for lab draw. She felt lightheaded and dizzy, and upon sitting on the table she passed out. She was admitted to the hospital for syncopal workup.  She was monitored without any significant arrhythmias. CT scan of the brain was unremarkable. She returned to baseline, her syncope was probably vasovagal or orthostatic hypotension and dehydration from chemo  -She has had intermittent dizziness and fatigue after the last 2 cycle chemo, due to her last syncope episode, I will reduce her chemo dose by 15%  today (09/08/17) and have her come back in 1 week for IV Fluids. If she continues to have poor tolerance, I may consider discontinuing.  -Her glucose is 159 today and her PCP is concerned about DM. I explained that this is becuase of her steroids.  -I went over pt's medication list with her and her daughter. She has stopped Lasix and Aldactone and I suggest her to restart 81 mg ASA. She will decrease decadron to 1 tablet twice daily for 3 days following chemo due to her hyperglycemia  -f/u in 3 weeks with Lacie    2. Genetics -Due to her age and triple negative breast cancer, and positive family history, she will be referred to genetics -Results  were negative for any gene mutations.   3.  Chronic combined systolic and diastolic heart failure, EF 45-50% -Follow-up with her cardiologist Dr. Ellyn Hack  -repeat ECHO on 05/14/17 shows improved heart function  -She is on lasix and spironolactone, I may hold if she becomes dehydrated. She can continue aspirin.  -Will repeat ECHO after chemotherapy -I suggest if she drinks less she should cut back on her lasix to prevent dehydration. She should check her BP at home regularly. If her BP is low and she feels dizzy she should contact our clinic and hold lasix.   4. HTN, COPD, retinitis pigmentosa with limited vision -Follow-up with primary care physician -She is legally blind -I explained she may notice change in  vision or mild blurred vision over time with chemotherapy. This is likely reversible after chemotherapy.   5. Financial Support -She met with our financial office -She qualified for [employer]    PLAN:  Reduce Docetaxel to 65 mg/m2 and cyclophosphamide to 500 mg/m2 today due to her last  Lab and IVF 2 hrs in 1 and 4 weeks (one week after chemo) Decrease Decadron to 1 tablet twice daily the day before and following chemo for 3 days stop Lasix and Aldactone (as she already did) for now and restart 81 mg ASA F/u in 3 weeks    No orders of the defined types were placed in this encounter.   All questions were answered. The patient knows to call the clinic with any problems, questions or concerns.  I spent 20 minutes counseling the patient face to face. The total time spent in the appointment was 25 minutes and more than 50% was on counseling.     [name], MD 09/08/2017    This document serves as a record of services personally performed by [name], MD. It was created on her behalf by Theresia Bough, a trained medical scribe. The creation of this record is based on the scribe's personal observations and the provider's statements to them.   I have reviewed the above documentation for accuracy and completeness, and I agree with the above.

## 2017-09-04 ENCOUNTER — Telehealth: Payer: Self-pay | Admitting: Medical

## 2017-09-04 NOTE — Telephone Encounter (Signed)
Try and have her come in for labs Friday early am if possible!!

## 2017-09-05 ENCOUNTER — Telehealth: Payer: Self-pay | Admitting: Medical

## 2017-09-05 NOTE — Telephone Encounter (Signed)
I called and spoke to patient's daughter.  Last week when she was here apparently we had problems drawing her blood, then she went to the lab draw station and they also had problems drawing her blood.  She is having chemotherapy on January 28 and she wants them to draw blood from her port at that visit.  They did not want to come by and pick up a lab order prescription so I advised that if they for whatever reason cannot see our orders in epic they can have the lab call when she comes in for the lab draw we will verify the lab orders.  I advised that she continue to follow-up with oncology as planned, and follow-up with Jocelyn Lamer here as planned as well.   Jonathon Resides  I saw patient last week for hospital follow-up.  She was doing okay at that visit had not had any recurrence of symptoms such as syncope that landed her in the hospital.  She requested to change her DNR back to full code last visit.  While she was here I asked Tammy to look into that paperwork.  I am not sure if anything was done that day.  I have not done that paperwork before and was not sure what forms to use.  Please check into this Vickie.

## 2017-09-07 NOTE — Telephone Encounter (Signed)
Please contact patient and if she wants to be a full code and not a DNR then we should change this in the computer on the documentation. Thanks.

## 2017-09-08 ENCOUNTER — Inpatient Hospital Stay (HOSPITAL_BASED_OUTPATIENT_CLINIC_OR_DEPARTMENT_OTHER): Payer: Medicare HMO | Admitting: Hematology

## 2017-09-08 ENCOUNTER — Telehealth: Payer: Self-pay | Admitting: Family Medicine

## 2017-09-08 ENCOUNTER — Inpatient Hospital Stay: Payer: Medicare HMO

## 2017-09-08 ENCOUNTER — Encounter: Payer: Self-pay | Admitting: Hematology

## 2017-09-08 ENCOUNTER — Telehealth: Payer: Self-pay | Admitting: Hematology

## 2017-09-08 VITALS — BP 158/86 | HR 83 | Temp 97.9°F | Resp 20 | Ht 67.0 in | Wt 241.5 lb

## 2017-09-08 DIAGNOSIS — I10 Essential (primary) hypertension: Secondary | ICD-10-CM

## 2017-09-08 DIAGNOSIS — R5383 Other fatigue: Secondary | ICD-10-CM

## 2017-09-08 DIAGNOSIS — Z171 Estrogen receptor negative status [ER-]: Secondary | ICD-10-CM | POA: Diagnosis not present

## 2017-09-08 DIAGNOSIS — Z5111 Encounter for antineoplastic chemotherapy: Secondary | ICD-10-CM | POA: Diagnosis not present

## 2017-09-08 DIAGNOSIS — C50512 Malignant neoplasm of lower-outer quadrant of left female breast: Secondary | ICD-10-CM

## 2017-09-08 DIAGNOSIS — J449 Chronic obstructive pulmonary disease, unspecified: Secondary | ICD-10-CM | POA: Diagnosis not present

## 2017-09-08 DIAGNOSIS — Z95828 Presence of other vascular implants and grafts: Secondary | ICD-10-CM

## 2017-09-08 DIAGNOSIS — R197 Diarrhea, unspecified: Secondary | ICD-10-CM

## 2017-09-08 DIAGNOSIS — I5042 Chronic combined systolic (congestive) and diastolic (congestive) heart failure: Secondary | ICD-10-CM

## 2017-09-08 DIAGNOSIS — E860 Dehydration: Secondary | ICD-10-CM

## 2017-09-08 DIAGNOSIS — Z5189 Encounter for other specified aftercare: Secondary | ICD-10-CM | POA: Diagnosis not present

## 2017-09-08 LAB — COMPREHENSIVE METABOLIC PANEL
ALBUMIN: 3.7 g/dL (ref 3.5–5.0)
ALK PHOS: 103 U/L (ref 40–150)
ALT: 18 U/L (ref 0–55)
ANION GAP: 6 (ref 3–11)
AST: 16 U/L (ref 5–34)
BUN: 13 mg/dL (ref 7–26)
CHLORIDE: 111 mmol/L — AB (ref 98–109)
CO2: 23 mmol/L (ref 22–29)
Calcium: 9.4 mg/dL (ref 8.4–10.4)
Creatinine, Ser: 0.78 mg/dL (ref 0.60–1.10)
GFR calc non Af Amer: >60 mL/min (ref >60)
GLUCOSE: 159 mg/dL — AB (ref 70–140)
Potassium: 4.4 mmol/L (ref 3.3–4.7)
Sodium: 140 mmol/L (ref 136–145)
Total Bilirubin: 0.4 mg/dL (ref 0.2–1.2)
Total Protein: 7.4 g/dL (ref 6.4–8.3)

## 2017-09-08 LAB — CBC WITH DIFFERENTIAL/PLATELET
Basophils Absolute: 0.1 10*3/uL (ref 0.0–0.1)
Basophils Relative: 0 %
Eosinophils Absolute: 0 10*3/uL (ref 0.0–0.5)
Eosinophils Relative: 0 %
HEMATOCRIT: 29.8 % — AB (ref 34.8–46.6)
HEMOGLOBIN: 9.9 g/dL — AB (ref 11.6–15.9)
LYMPHS ABS: 1 10*3/uL (ref 0.9–3.3)
LYMPHS PCT: 8 %
MCH: 30.6 pg (ref 25.1–34.0)
MCHC: 33 g/dL (ref 31.5–36.0)
MCV: 92.7 fL (ref 79.5–101.0)
MONO ABS: 0.4 10*3/uL (ref 0.1–0.9)
Monocytes Relative: 3 %
NEUTROS ABS: 11.4 10*3/uL — AB (ref 1.5–6.5)
NEUTROS PCT: 89 %
Platelets: 455 10*3/uL — ABNORMAL HIGH (ref 145–400)
RBC: 3.22 MIL/uL — ABNORMAL LOW (ref 3.70–5.45)
RDW: 17.2 % — AB (ref 11.2–16.1)
WBC: 12.8 10*3/uL — ABNORMAL HIGH (ref 3.9–10.3)

## 2017-09-08 MED ORDER — SODIUM CHLORIDE 0.9 % IV SOLN
500.0000 mg/m2 | Freq: Once | INTRAVENOUS | Status: AC
  Administered 2017-09-08: 1140 mg via INTRAVENOUS
  Filled 2017-09-08: qty 57

## 2017-09-08 MED ORDER — SODIUM CHLORIDE 0.9 % IV SOLN
Freq: Once | INTRAVENOUS | Status: AC
  Administered 2017-09-08: 13:00:00 via INTRAVENOUS

## 2017-09-08 MED ORDER — PALONOSETRON HCL INJECTION 0.25 MG/5ML
INTRAVENOUS | Status: AC
  Filled 2017-09-08: qty 5

## 2017-09-08 MED ORDER — SODIUM CHLORIDE 0.9% FLUSH
10.0000 mL | INTRAVENOUS | Status: DC | PRN
  Administered 2017-09-08: 10 mL
  Filled 2017-09-08: qty 10

## 2017-09-08 MED ORDER — SODIUM CHLORIDE 0.9 % IV SOLN
Freq: Once | INTRAVENOUS | Status: AC
  Administered 2017-09-08: 13:00:00 via INTRAVENOUS
  Filled 2017-09-08: qty 5

## 2017-09-08 MED ORDER — PEGFILGRASTIM 6 MG/0.6ML ~~LOC~~ PSKT
PREFILLED_SYRINGE | SUBCUTANEOUS | Status: AC
  Filled 2017-09-08: qty 0.6

## 2017-09-08 MED ORDER — PALONOSETRON HCL INJECTION 0.25 MG/5ML
0.2500 mg | Freq: Once | INTRAVENOUS | Status: AC
  Administered 2017-09-08: 0.25 mg via INTRAVENOUS

## 2017-09-08 MED ORDER — PEGFILGRASTIM 6 MG/0.6ML ~~LOC~~ PSKT
6.0000 mg | PREFILLED_SYRINGE | Freq: Once | SUBCUTANEOUS | Status: AC
  Administered 2017-09-08: 6 mg via SUBCUTANEOUS

## 2017-09-08 MED ORDER — DOCETAXEL CHEMO INJECTION 160 MG/16ML
65.0000 mg/m2 | Freq: Once | INTRAVENOUS | Status: AC
  Administered 2017-09-08: 150 mg via INTRAVENOUS
  Filled 2017-09-08: qty 15

## 2017-09-08 MED ORDER — HEPARIN SOD (PORK) LOCK FLUSH 100 UNIT/ML IV SOLN
500.0000 [IU] | Freq: Once | INTRAVENOUS | Status: AC | PRN
  Administered 2017-09-08: 500 [IU]
  Filled 2017-09-08: qty 5

## 2017-09-08 MED ORDER — SODIUM CHLORIDE 0.9% FLUSH
10.0000 mL | INTRAVENOUS | Status: DC | PRN
  Administered 2017-09-08: 10 mL via INTRAVENOUS
  Filled 2017-09-08: qty 10

## 2017-09-08 NOTE — Patient Instructions (Signed)
[facility] Discharge Instructions for Patients Receiving Chemotherapy  Today you received the following chemotherapy agents Taxotere and Cytoxan  To help prevent nausea and vomiting after your treatment, we encourage you to take your nausea medication as directed.    If you develop nausea and vomiting that is not controlled by your nausea medication, call the clinic.   BELOW ARE SYMPTOMS THAT SHOULD BE REPORTED IMMEDIATELY:  *FEVER GREATER THAN 100.5 F  *CHILLS WITH OR WITHOUT FEVER  NAUSEA AND VOMITING THAT IS NOT CONTROLLED WITH YOUR NAUSEA MEDICATION  *UNUSUAL SHORTNESS OF BREATH  *UNUSUAL BRUISING OR BLEEDING  TENDERNESS IN MOUTH AND THROAT WITH OR WITHOUT PRESENCE OF ULCERS  *URINARY PROBLEMS  *BOWEL PROBLEMS  UNUSUAL RASH Items with * indicate a potential emergency and should be followed up as soon as possible.  Feel free to call the clinic should you have any questions or concerns. The clinic phone number is [phone].  Please show the CHEMO ALERT CARD at check-in to the Emergency Department and triage nurse.   

## 2017-09-08 NOTE — Telephone Encounter (Signed)
Gave avs and calendar for february °

## 2017-09-08 NOTE — Telephone Encounter (Signed)
Please check on this and delete the DNR. She now is a full code

## 2017-09-08 NOTE — Telephone Encounter (Signed)
Pt is at the cancer center, they are able to draw blood from her port.  She was here last week and we were not able to get blood, we sent her to [facility] and they were not able to get blood. I gave her the active orders and advised her the Cancer center could see them on line through [place].

## 2017-09-08 NOTE — Telephone Encounter (Signed)
Pt wants a full code done

## 2017-09-10 ENCOUNTER — Telehealth: Payer: Self-pay | Admitting: Family Medicine

## 2017-09-10 NOTE — Telephone Encounter (Signed)
Called IT t/w [place] advised to remove DNR from patients chart and from patients banner.  Also sent HIM department same note.

## 2017-09-11 NOTE — Telephone Encounter (Signed)
I have sent a request to IT to remove the DNR from the chart and have the DNR removed from the banner on the patients EMR.  HIM has also been advised.

## 2017-09-16 ENCOUNTER — Inpatient Hospital Stay: Payer: Medicare HMO | Attending: Nurse Practitioner

## 2017-09-16 ENCOUNTER — Inpatient Hospital Stay: Payer: Medicare HMO

## 2017-09-16 ENCOUNTER — Other Ambulatory Visit: Payer: Self-pay | Admitting: Nurse Practitioner

## 2017-09-16 DIAGNOSIS — C50512 Malignant neoplasm of lower-outer quadrant of left female breast: Secondary | ICD-10-CM | POA: Diagnosis present

## 2017-09-16 DIAGNOSIS — H3552 Pigmentary retinal dystrophy: Secondary | ICD-10-CM | POA: Diagnosis not present

## 2017-09-16 DIAGNOSIS — Z5111 Encounter for antineoplastic chemotherapy: Secondary | ICD-10-CM | POA: Diagnosis present

## 2017-09-16 DIAGNOSIS — Z171 Estrogen receptor negative status [ER-]: Principal | ICD-10-CM

## 2017-09-16 DIAGNOSIS — J449 Chronic obstructive pulmonary disease, unspecified: Secondary | ICD-10-CM | POA: Diagnosis not present

## 2017-09-16 DIAGNOSIS — B009 Herpesviral infection, unspecified: Secondary | ICD-10-CM | POA: Diagnosis not present

## 2017-09-16 DIAGNOSIS — I5042 Chronic combined systolic (congestive) and diastolic (congestive) heart failure: Secondary | ICD-10-CM | POA: Insufficient documentation

## 2017-09-16 DIAGNOSIS — I10 Essential (primary) hypertension: Secondary | ICD-10-CM | POA: Diagnosis not present

## 2017-09-16 LAB — COMPREHENSIVE METABOLIC PANEL
ALK PHOS: 119 U/L (ref 40–150)
ALT: 16 U/L (ref 0–55)
ANION GAP: 8 (ref 3–11)
AST: 23 U/L (ref 5–34)
Albumin: 3.4 g/dL — ABNORMAL LOW (ref 3.5–5.0)
BILIRUBIN TOTAL: 0.3 mg/dL (ref 0.2–1.2)
BUN: 12 mg/dL (ref 7–26)
CALCIUM: 8.8 mg/dL (ref 8.4–10.4)
CO2: 22 mmol/L (ref 22–29)
Chloride: 114 mmol/L — ABNORMAL HIGH (ref 98–109)
Creatinine, Ser: 0.78 mg/dL (ref 0.60–1.10)
GFR calc non Af Amer: >60 mL/min (ref >60)
Glucose, Bld: 98 mg/dL (ref 70–140)
Potassium: 3.7 mmol/L (ref 3.5–5.1)
Sodium: 144 mmol/L (ref 136–145)
TOTAL PROTEIN: 6.6 g/dL (ref 6.4–8.3)

## 2017-09-16 LAB — CBC WITH DIFFERENTIAL/PLATELET
Basophils Absolute: 0.2 10*3/uL — ABNORMAL HIGH (ref 0.0–0.1)
Basophils Relative: 1 %
Eosinophils Absolute: 0.1 10*3/uL (ref 0.0–0.5)
Eosinophils Relative: 1 %
HEMATOCRIT: 29 % — AB (ref 34.8–46.6)
HEMOGLOBIN: 9.4 g/dL — AB (ref 11.6–15.9)
Lymphocytes Relative: 6 %
Lymphs Abs: 1.8 10*3/uL (ref 0.9–3.3)
MCH: 30.4 pg (ref 25.1–34.0)
MCHC: 32.5 g/dL (ref 31.5–36.0)
MCV: 93.5 fL (ref 79.5–101.0)
MONOS PCT: 4 %
Monocytes Absolute: 1.1 10*3/uL — ABNORMAL HIGH (ref 0.1–0.9)
NEUTROS ABS: 25.8 10*3/uL — AB (ref 1.5–6.5)
NEUTROS PCT: 88 %
Platelets: 413 10*3/uL — ABNORMAL HIGH (ref 145–400)
RBC: 3.1 MIL/uL — ABNORMAL LOW (ref 3.70–5.45)
RDW: 17.2 % — ABNORMAL HIGH (ref 11.2–14.5)
WBC: 28.9 10*3/uL — ABNORMAL HIGH (ref 3.9–10.3)

## 2017-09-16 MED ORDER — SODIUM CHLORIDE 0.9% FLUSH
10.0000 mL | Freq: Once | INTRAVENOUS | Status: AC
  Administered 2017-09-16: 10 mL via INTRAVENOUS
  Filled 2017-09-16: qty 10

## 2017-09-16 MED ORDER — HEPARIN SOD (PORK) LOCK FLUSH 100 UNIT/ML IV SOLN
500.0000 [IU] | Freq: Once | INTRAVENOUS | Status: AC
  Administered 2017-09-16: 500 [IU] via INTRAVENOUS
  Filled 2017-09-16: qty 5

## 2017-09-16 MED ORDER — SODIUM CHLORIDE 0.9 % IV SOLN
Freq: Once | INTRAVENOUS | Status: AC
  Administered 2017-09-16: 13:00:00 via INTRAVENOUS

## 2017-09-16 NOTE — Patient Instructions (Addendum)
Dehydration, Adult Dehydration is when there is not enough fluid or water in your body. This happens when you lose more fluids than you take in. Dehydration can range from mild to very bad. It should be treated right away to keep it from getting very bad. Symptoms of mild dehydration may include:  Thirst.  Dry lips.  Slightly dry mouth.  Dry, warm skin.  Dizziness. Symptoms of moderate dehydration may include:  Very dry mouth.  Muscle cramps.  Dark pee (urine). Pee may be the color of tea.  Your body making less pee.  Your eyes making fewer tears.  Heartbeat that is uneven or faster than normal (palpitations).  Headache.  Light-headedness, especially when you stand up from sitting.  Fainting (syncope). Symptoms of very bad dehydration may include:  Changes in skin, such as: ? Cold and clammy skin. ? Blotchy (mottled) or pale skin. ? Skin that does not quickly return to normal after being lightly pinched and let go (poor skin turgor).  Changes in body fluids, such as: ? Feeling very thirsty. ? Your eyes making fewer tears. ? Not sweating when body temperature is high, such as in hot weather. ? Your body making very little pee.  Changes in vital signs, such as: ? Weak pulse. ? Pulse that is more than 100 beats a minute when you are sitting still. ? Fast breathing. ? Low blood pressure.  Other changes, such as: ? Sunken eyes. ? Cold hands and feet. ? Confusion. ? Lack of energy (lethargy). ? Trouble waking up from sleep. ? Short-term weight loss. ? Unconsciousness. Follow these instructions at home:  If told by your doctor, drink an ORS: ? Make an ORS by using instructions on the package. ? Start by drinking small amounts, about  cup (120 mL) every 5-10 minutes. ? Slowly drink more until you have had the amount that your doctor said to have.  Drink enough clear fluid to keep your pee clear or pale yellow. If you were told to drink an ORS, finish the ORS  first, then start slowly drinking clear fluids. Drink fluids such as: ? Water. Do not drink only water by itself. Doing that can make the salt (sodium) level in your body get too low (hyponatremia). ? Ice chips. ? Fruit juice that you have added water to (diluted). ? Low-calorie sports drinks.  Avoid: ? Alcohol. ? Drinks that have a lot of sugar. These include high-calorie sports drinks, fruit juice that does not have water added, and soda. ? Caffeine. ? Foods that are greasy or have a lot of fat or sugar.  Take over-the-counter and prescription medicines only as told by your doctor.  Do not take salt tablets. Doing that can make the salt level in your body get too high (hypernatremia).  Eat foods that have minerals (electrolytes). Examples include bananas, oranges, potatoes, tomatoes, and spinach.  Keep all follow-up visits as told by your doctor. This is important. Contact a doctor if:  You have belly (abdominal) pain that: ? Gets worse. ? Stays in one area (localizes).  You have a rash.  You have a stiff neck.  You get angry or annoyed more easily than normal (irritability).  You are more sleepy than normal.  You have a harder time waking up than normal.  You feel: ? Weak. ? Dizzy. ? Very thirsty.  You have peed (urinated) only a small amount of very dark pee during 6-8 hours. Get help right away if:  You have symptoms of   very bad dehydration.  You cannot drink fluids without throwing up (vomiting).  Your symptoms get worse with treatment.  You have a fever.  You have a very bad headache.  You are throwing up or having watery poop (diarrhea) and it: ? Gets worse. ? Does not go away.  You have blood or something green (bile) in your throw-up.  You have blood in your poop (stool). This may cause poop to look black and tarry.  You have not peed in 6-8 hours.  You pass out (faint).  Your heart rate when you are sitting still is more than 100 beats a  minute.  You have trouble breathing. This information is not intended to replace advice given to you by your health care provider. Make sure you discuss any questions you have with your health care provider. Document Released: 05/25/2009 Document Revised: 02/16/2016 Document Reviewed: 09/22/2015 Elsevier Interactive Patient Education  2018 Elsevier Inc.  

## 2017-09-18 ENCOUNTER — Telehealth: Payer: Self-pay | Admitting: *Deleted

## 2017-09-18 NOTE — Telephone Encounter (Signed)
Pt called reporting runny nose and dry coughs.  Spoke with pt and was informed re:  For past 2 days, pt develops runny nose with clear mucus, and dry coughs.  Denied fever; drinking lots of fluids as tolerated, appetite is fine; bowel and bladder function fine.  Lucianne Lei, [facility] notified.  Spoke with pt again, and instructed pt to take Coricidin OTC for runny nose and dry cough as per PA.   Instructed pt that she can also take Mucinex as needed.  Pt understood to call office back if changes or worsening of symptoms.

## 2017-09-29 ENCOUNTER — Inpatient Hospital Stay: Payer: Medicare HMO

## 2017-09-29 ENCOUNTER — Inpatient Hospital Stay (HOSPITAL_BASED_OUTPATIENT_CLINIC_OR_DEPARTMENT_OTHER): Payer: Medicare HMO | Admitting: Nurse Practitioner

## 2017-09-29 ENCOUNTER — Encounter: Payer: Self-pay | Admitting: Nurse Practitioner

## 2017-09-29 VITALS — BP 150/102 | HR 73 | Temp 98.4°F | Resp 16 | Ht 67.0 in | Wt 239.7 lb

## 2017-09-29 DIAGNOSIS — H3552 Pigmentary retinal dystrophy: Secondary | ICD-10-CM

## 2017-09-29 DIAGNOSIS — I10 Essential (primary) hypertension: Secondary | ICD-10-CM

## 2017-09-29 DIAGNOSIS — I5042 Chronic combined systolic (congestive) and diastolic (congestive) heart failure: Secondary | ICD-10-CM

## 2017-09-29 DIAGNOSIS — B009 Herpesviral infection, unspecified: Secondary | ICD-10-CM | POA: Diagnosis not present

## 2017-09-29 DIAGNOSIS — A6000 Herpesviral infection of urogenital system, unspecified: Secondary | ICD-10-CM

## 2017-09-29 DIAGNOSIS — J449 Chronic obstructive pulmonary disease, unspecified: Secondary | ICD-10-CM

## 2017-09-29 DIAGNOSIS — Z171 Estrogen receptor negative status [ER-]: Principal | ICD-10-CM

## 2017-09-29 DIAGNOSIS — C50512 Malignant neoplasm of lower-outer quadrant of left female breast: Secondary | ICD-10-CM

## 2017-09-29 DIAGNOSIS — Z95828 Presence of other vascular implants and grafts: Secondary | ICD-10-CM

## 2017-09-29 DIAGNOSIS — Z5111 Encounter for antineoplastic chemotherapy: Secondary | ICD-10-CM | POA: Diagnosis not present

## 2017-09-29 LAB — CBC WITH DIFFERENTIAL/PLATELET
Basophils Absolute: 0 10*3/uL (ref 0.0–0.1)
Basophils Relative: 1 %
EOS ABS: 0.2 10*3/uL (ref 0.0–0.5)
EOS PCT: 3 %
HCT: 30.5 % — ABNORMAL LOW (ref 34.8–46.6)
Hemoglobin: 10.1 g/dL — ABNORMAL LOW (ref 11.6–15.9)
LYMPHS PCT: 21 %
Lymphs Abs: 1.3 10*3/uL (ref 0.9–3.3)
MCH: 30.8 pg (ref 25.1–34.0)
MCHC: 33.3 g/dL (ref 31.5–36.0)
MCV: 92.6 fL (ref 79.5–101.0)
MONO ABS: 0.7 10*3/uL (ref 0.1–0.9)
Monocytes Relative: 11 %
Neutro Abs: 4 10*3/uL (ref 1.5–6.5)
Neutrophils Relative %: 64 %
PLATELETS: 376 10*3/uL (ref 145–400)
RBC: 3.3 MIL/uL — ABNORMAL LOW (ref 3.70–5.45)
RDW: 17.5 % — AB (ref 11.2–14.5)
WBC: 6.2 10*3/uL (ref 3.9–10.3)

## 2017-09-29 LAB — COMPREHENSIVE METABOLIC PANEL
ALT: 11 U/L (ref 0–55)
ANION GAP: 8 (ref 3–11)
AST: 21 U/L (ref 5–34)
Albumin: 3.6 g/dL (ref 3.5–5.0)
Alkaline Phosphatase: 112 U/L (ref 40–150)
BUN: 7 mg/dL (ref 7–26)
CHLORIDE: 110 mmol/L — AB (ref 98–109)
CO2: 23 mmol/L (ref 22–29)
Calcium: 9.5 mg/dL (ref 8.4–10.4)
Creatinine, Ser: 0.79 mg/dL (ref 0.60–1.10)
Glucose, Bld: 89 mg/dL (ref 70–140)
Potassium: 3.8 mmol/L (ref 3.5–5.1)
Sodium: 141 mmol/L (ref 136–145)
Total Bilirubin: 0.5 mg/dL (ref 0.2–1.2)
Total Protein: 7.4 g/dL (ref 6.4–8.3)

## 2017-09-29 MED ORDER — PEGFILGRASTIM 6 MG/0.6ML ~~LOC~~ PSKT
PREFILLED_SYRINGE | SUBCUTANEOUS | Status: AC
  Filled 2017-09-29: qty 0.6

## 2017-09-29 MED ORDER — SODIUM CHLORIDE 0.9 % IV SOLN
500.0000 mg/m2 | Freq: Once | INTRAVENOUS | Status: AC
  Administered 2017-09-29: 1140 mg via INTRAVENOUS
  Filled 2017-09-29: qty 57

## 2017-09-29 MED ORDER — VALACYCLOVIR HCL 1 G PO TABS: 1000.0000 mg | ORAL_TABLET | Freq: Every day | ORAL | 6 refills | Status: DC

## 2017-09-29 MED ORDER — SODIUM CHLORIDE 0.9% FLUSH
10.0000 mL | INTRAVENOUS | Status: DC | PRN
  Administered 2017-09-29: 10 mL
  Filled 2017-09-29: qty 10

## 2017-09-29 MED ORDER — SODIUM CHLORIDE 0.9 % IV SOLN
Freq: Once | INTRAVENOUS | Status: AC
  Administered 2017-09-29: 13:00:00 via INTRAVENOUS

## 2017-09-29 MED ORDER — PALONOSETRON HCL INJECTION 0.25 MG/5ML
INTRAVENOUS | Status: AC
  Filled 2017-09-29: qty 5

## 2017-09-29 MED ORDER — PEGFILGRASTIM 6 MG/0.6ML ~~LOC~~ PSKT
6.0000 mg | PREFILLED_SYRINGE | Freq: Once | SUBCUTANEOUS | Status: AC
  Administered 2017-09-29: 6 mg via SUBCUTANEOUS

## 2017-09-29 MED ORDER — SODIUM CHLORIDE 0.9 % IV SOLN
Freq: Once | INTRAVENOUS | Status: AC
  Administered 2017-09-29: 13:00:00 via INTRAVENOUS
  Filled 2017-09-29: qty 5

## 2017-09-29 MED ORDER — HEPARIN SOD (PORK) LOCK FLUSH 100 UNIT/ML IV SOLN
500.0000 [IU] | Freq: Once | INTRAVENOUS | Status: AC | PRN
  Administered 2017-09-29: 500 [IU]
  Filled 2017-09-29: qty 5

## 2017-09-29 MED ORDER — PALONOSETRON HCL INJECTION 0.25 MG/5ML
0.2500 mg | Freq: Once | INTRAVENOUS | Status: AC
  Administered 2017-09-29: 0.25 mg via INTRAVENOUS

## 2017-09-29 MED ORDER — DOCETAXEL CHEMO INJECTION 160 MG/16ML
65.0000 mg/m2 | Freq: Once | INTRAVENOUS | Status: AC
  Administered 2017-09-29: 150 mg via INTRAVENOUS
  Filled 2017-09-29: qty 15

## 2017-09-29 MED ORDER — SODIUM CHLORIDE 0.9% FLUSH
10.0000 mL | INTRAVENOUS | Status: DC | PRN
  Administered 2017-09-29: 10 mL via INTRAVENOUS
  Filled 2017-09-29: qty 10

## 2017-09-29 NOTE — Patient Instructions (Signed)

## 2017-09-29 NOTE — Progress Notes (Signed)
[place]  Telephone:([phone]) [phone] Fax:(336) [phone]  Clinic Follow up Note   Patient Care Team: Girtha Rm, NP-C as PCP - General (Family Medicine) [name], MD as Consulting Physician (Hematology) [name], MD as Consulting Physician (General Surgery) 09/29/2017  CHIEF COMPLAINT: F/u left breast cancer   SUMMARY OF ONCOLOGIC HISTORY: Oncology History   Cancer Staging Malignant neoplasm of lower-outer quadrant of left breast of female, estrogen receptor negative ([place]) Staging form: Breast, AJCC 8th Edition - Clinical stage from 06/06/2017: Stage IB (cT1c, cN0, cM0, G3, ER: Negative, PR: Negative, HER2: Negative) - Signed by [name], MD on 06/15/2017 - Pathologic stage from 06/26/2017: Stage IB (pT1c, pN0, cM0, G3, ER: Negative, PR: Negative, HER2: Negative) - Signed by [name], MD on 07/10/2017       Malignant neoplasm of lower-outer quadrant of left breast of female, estrogen receptor negative ([place])   05/30/2017 Mammogram    Diagnostic mammo and US IMPRESSION: 1. Highly suspicious 1.4 cm mass in the slightly lower slightly outer left breast -tissue sampling recommended. 2. Indeterminate 0.5 mm mass in the slightly lower slightly outer left breast -tissue sampling recommended. 3. At least 2 left axillary lymph nodes with borderline cortical thickness.      06/06/2017 Initial Biopsy    Diagnosis 1. Breast, left, needle core biopsy, 5:30 o'clock - INVASIVE DUCTAL CARCINOMA, G3 2. Lymph node, needle/core biopsy, left axillary - NO CARCINOMA IDENTIFIED IN ONE LYMPH NODE (0/1)      06/06/2017 Initial Diagnosis    Malignant neoplasm of lower-outer quadrant of left breast of female, estrogen receptor negative ([place])      06/06/2017 Receptors her2    Estrogen Receptor: 0%, NEGATIVE Progesterone Receptor: 0%, NEGATIVE Proliferation Marker Ki67: 70%      06/26/2017 Surgery    LEFT BREAST LUMPECTOMY WITH RADIOACTIVE SEED AND SENTINEL  LYMPH NODE BIOPSY ERAS  PATHWAY AND INSERTION PORT-A-CATH By Dr. Barry Dienes on 06/26/17       06/26/2017 Pathology Results    Diagnosis 06/26/17  1. Breast, lumpectomy, Left - INVASIVE DUCTAL CARCINOMA, GRADE III/III, SPANNING 1.2 CM. - THE SURGICAL RESECTION MARGINS ARE NEGATIVE FOR CARCINOMA. - SEE ONCOLOGY TABLE BELOW. 2. Lymph node, sentinel, biopsy, Left axillary #1 - THERE IS NO EVIDENCE OF CARCINOMA IN 1 OF 1 LYMPH NODE (0/1). 3. Lymph node, sentinel, biopsy, Left axillary #2 - THERE IS NO EVIDENCE OF CARCINOMA IN 1 OF 1 LYMPH NODE (0/1). 4. Lymph node, sentinel, biopsy, Left axillary #3 - THERE IS NO EVIDENCE OF CARCINOMA IN 1 OF 1 LYMPH NODE (0/1).       07/25/2017 -  Chemotherapy    CT every 3 weekd for 4 cycles plan to start on 07/25/17       CURRENT THERAPY: adjuvant chemo TC every 3 weeks for 4 cycles starting 07/25/17  INTERVAL HISTORY: Kristin Ward returns for follow up as scheduled prior to final cycle adjuvant chemo TC. Last treated on 1/28. She tolerated last cycle well. 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 She slept most of the day and did not take any medication, such as BP meds. She had allergy symptoms with rhinorrhea and dry cough last week when the weather warmed up. No sore throat, fever, chills, dyspnea, or chest pain. Appetite is normal, taste decreased but eating well overall. No nausea, vomiting, or diarrhea. Occasional constipation. Takes miralax if needed but that causes nausea. Denies dysuria or hematuria. Denies pain. Sight  is improving, she can see shadows, lights, and faces.   REVIEW OF SYSTEMS:   Constitutional: Denies fevers, chills or abnormal weight loss (+) mild fatigue Eyes: Denies blurriness of vision (+) legally blind (+) sight improving, can see faces, lights, shadows  Ears, nose, mouth, throat, and face: Denies mucositis or sore throat (+) clear rhinorrhea Respiratory:  Denies dyspnea or wheezes (+) cough  Cardiovascular: Denies palpitation, chest discomfort or lower extremity swelling Gastrointestinal:  Denies nausea, vomiting, diarrhea, heartburn or change in bowel habits (+) mild constipation, takes miralax PRN  Skin: Denies abnormal skin rashes Lymphatics: Denies new lymphadenopathy or easy bruising Neurological:Denies numbness, tingling or new weaknesses Behavioral/Psych: Mood is stable, no new changes  All other systems were reviewed with the patient and are negative.  MEDICAL HISTORY:  Past Medical History:  Diagnosis Date  . Abnormal myocardial perfusion study 03/15/2016   EF 15%, severe LV systolic dysfcn, global hypokinesis and inferior akinesis. dilated LV  . Abnormal x-ray of lungs with single pulmonary nodule 03/15/2016   per records from [place]   . Blind   . Cholelithiasis 05/19/2017   On CT  . COPD (chronic obstructive pulmonary disease) ([place])    PT DENIES  . Coronary artery calcification seen on CAT scan 05/19/2017  . Dilated idiopathic cardiomyopathy ([place]) 12/2015   EF 15-20%. Diagnosed in [facility]  . Family history of colon cancer   . Genetic testing 06/19/2017   Multi-Cancer panel (83 genes) @ [facility] - No pathogenic mutations detected  . Headache    NONE RECENT  . Hypertension   . Malignant neoplasm of lower-outer quadrant of left breast of female, estrogen receptor negative ([place]) 06/11/2017   left breast  . Retinitis pigmentosa   . Solitary pulmonary nodule on lung CT 03/16/2016   per records from [place]  . Uveitis     SURGICAL HISTORY: Past Surgical History:  Procedure Laterality Date  . brain cyst removed  2007   to help with headaches per patient , aspirated   . BREAST LUMPECTOMY WITH RADIOACTIVE SEED AND SENTINEL LYMPH NODE BIOPSY Left 06/26/2017   Procedure: BREAST LUMPECTOMY WITH RADIOACTIVE SEED AND SENTINEL LYMPH NODE BIOPSY ERAS  PATHWAY;  Surgeon: [name], MD;  Location: [place];   Service: General;  Laterality: Left;  pec block  . NASAL TURBINATE REDUCTION    . PORTACATH PLACEMENT N/A 06/26/2017   Procedure: INSERTION PORT-A-CATH;  Surgeon: [name], MD;  Location: [place];  Service: General;  Laterality: N/A;  . TRANSTHORACIC ECHOCARDIOGRAM  12/2015   [facility] May 2017: Mild concentric LVH. Global hypokinesis. GR daily. EF 18% severe LA dilation. Mitral annular dilatation with papillary muscle dysfunction and moderate MR. Dilated IVC consistent with elevated RAP.    I have reviewed the social history and family history with the patient and they are unchanged from previous note.  ALLERGIES:  is allergic to latex; tylenol with codeine #3 [acetaminophen-codeine]; and penicillins.  MEDICATIONS:  Current Outpatient Medications  Medication Sig Dispense Refill  . aspirin 81 MG chewable tablet Chew 81 mg by mouth daily.    . carvedilol (COREG) 12.5 MG tablet Take 1 tablet (12.5 mg total) by mouth 2 (two) times daily with a meal. 60 tablet 5  . dexamethasone (DECADRON) 4 MG tablet Take 2 tablets (8 mg total) by mouth 2 (two) times daily. Start the day before Taxotere. Then again the day after chemo for 3 days. 30 tablet 1  . lidocaine-prilocaine (EMLA) cream Apply to affected area once  30 g 3  . naproxen (NAPROSYN) 500 MG tablet Take 1 tablet (500 mg total) by mouth daily as needed for mild pain. 20 tablet 1  . ondansetron (ZOFRAN) 8 MG tablet Take 1 tablet (8 mg total) by mouth 2 (two) times daily as needed for refractory nausea / vomiting. Start on day 3 after chemo. 30 tablet 1  . prednisoLONE acetate (PRED FORTE) 1 % ophthalmic suspension INSTILL 1 DROP INTO BOTH EYES 4 TIMES A DAY  1  . sacubitril-valsartan (ENTRESTO) 24-26 MG Take 1 tablet by mouth 2 (two) times daily. 60 tablet 5  . vitamin C (ASCORBIC ACID) 500 MG tablet Take 1,000 mg 2 (two) times daily by mouth.    . oxyCODONE (OXY IR/ROXICODONE) 5 MG immediate release tablet Take 1-2 tablets (5-10 mg  total) by mouth every 6 (six) hours as needed for moderate pain, severe pain or breakthrough pain. 20 tablet 0  . prochlorperazine (COMPAZINE) 10 MG tablet Take 1 tablet (10 mg total) by mouth every 6 (six) hours as needed (Nausea or vomiting). (Patient not taking: Reported on 08/29/2017) 30 tablet 1  . valACYclovir (VALTREX) 1000 MG tablet Take 1 tablet (1,000 mg total) by mouth daily. 7 tablet 6   No current facility-administered medications for this visit.    Facility-Administered Medications Ordered in Other Visits  Medication Dose Route Frequency Provider Last Rate Last Dose  . sodium chloride flush (NS) 0.9 % injection 10 mL  10 mL Intracatheter PRN [name], MD   10 mL at 09/29/17 1524    PHYSICAL EXAMINATION: ECOG PERFORMANCE STATUS: 1 - Symptomatic but completely ambulatory  Vitals:   09/29/17 1048 09/29/17 1100  BP: (!) 158/107 (!) 150/102  Pulse: 73   Resp: 16   Temp: 98.4 F (36.9 C)   SpO2: 99%    Filed Weights   09/29/17 1048  Weight: 239 lb 11.2 oz (108.7 kg)    GENERAL:alert, no distress and comfortable SKIN: skin color, texture, turgor are normal, no rashes or significant lesions EYES: normal, Conjunctiva are pink and non-injected, sclera clear OROPHARYNX:no exudate, no erythema and lips, buccal mucosa, and tongue normal  NECK: supple, thyroid normal size, non-tender, without nodularity LYMPH:  no palpable cervical, supraclavicular, axillary, or inguinal lymphadenopathy  LUNGS: clear to auscultation bilaterally with normal breathing effort HEART: regular rate & rhythm and no murmurs and no lower extremity edema ABDOMEN:abdomen soft, non-tender and normal bowel sounds Musculoskeletal:no cyanosis of digits and no clubbing  NEURO: alert & oriented x 3 with fluent speech, no focal motor/sensory deficits Breasts: inspection shows them to be symmetrical without nipple discharge. Right breast exam benign. (+) left breast s/p lumpectomy, surgical incisions are well  healed. No palpable mass in left breast or axilla that I could appreciate.  PAC without erythema   LABORATORY DATA:  I have reviewed the data as listed CBC Latest Ref Rng & Units 09/29/2017 09/16/2017 09/08/2017  WBC 3.9 - 10.3 K/uL 6.2 28.9(H) 12.8(H)  Hemoglobin 11.6 - 15.9 g/dL 10.1(L) 9.4(L) 9.9(L)  Hematocrit 34.8 - 46.6 % 30.5(L) 29.0(L) 29.8(L)  Platelets 145 - 400 K/uL 376 413(H) 455(H)     CMP Latest Ref Rng & Units 09/29/2017 09/16/2017 09/08/2017  Glucose 70 - 140 mg/dL 89 98 159(H)  BUN 7 - 26 mg/dL _0 Creatinine 0.60 - 1.10 mg/dL 0.79 0.78 0.78  Sodium 136 - 145 mmol/L 141 144 140  Potassium 3.5 - 5.1 mmol/L 3.8 3.7 4.4  Chloride 98 - 109 mmol/L 110(H)  114(H) 111(H)  CO2 22 - 29 mmol/L _0 Calcium 8.4 - 10.4 mg/dL 9.5 8.8 9.4  Total Protein 6.4 - 8.3 g/dL 7.4 6.6 7.4  Total Bilirubin 0.2 - 1.2 mg/dL 0.5 0.3 0.4  Alkaline Phos 40 - 150 U/L 112 119 103  AST 5 - 34 U/L _1 ALT 0 - 55 U/L _2 PATHOLOGY  Diagnosis 06/26/17  1. Breast, lumpectomy, Left - INVASIVE DUCTAL CARCINOMA, GRADE III/III, SPANNING 1.2 CM. - THE SURGICAL RESECTION MARGINS ARE NEGATIVE FOR CARCINOMA. - SEE ONCOLOGY TABLE BELOW. 2. Lymph node, sentinel, biopsy, Left axillary #1 - THERE IS NO EVIDENCE OF CARCINOMA IN 1 OF 1 LYMPH NODE (0/1). 3. Lymph node, sentinel, biopsy, Left axillary #2 - THERE IS NO EVIDENCE OF CARCINOMA IN 1 OF 1 LYMPH NODE (0/1). 4. Lymph node, sentinel, biopsy, Left axillary #3 - THERE IS NO EVIDENCE OF CARCINOMA IN 1 OF 1 LYMPH NODE (0/1). Microscopic Comment 1. BREAST, INVASIVE TUMOR Procedure: Seed localized lumpectomy. Laterality: Left. Tumor Size: 1.2 cm (gross measurement). Histologic Type: Ductal. Grade: III. Tubular Differentiation: 3. Nuclear Pleomorphism: 2. Mitotic Count: 3. Ductal Carcinoma in Situ (DCIS): Not identified. Extent of Tumor: Confined to breast parenchyma. Margins: Greater than 0.2 cm to all margins. Regional Lymph  Nodes: Number of Lymph Nodes Examined: 3. Number of Sentinel Lymph Nodes Examined: 3. Lymph Nodes with Macrometastases: 0. Lymph Nodes with Micrometastases: 0. Lymph Nodes with Isolated Tumor Cells: 0. Breast Prognostic Profile: Case [id number]. Estrogen Receptor: 0%. 1 of 3 FINAL for Ward, Kristin ANN [phone]) Microscopic Comment(continued) Progesterone Receptor: 0%. Her2: No amplification was detected. Ki-67: 70%. Best tumor block for sendout testing: 1A or 1B. Pathologic Stage Classification (pTNM, AJCC 8th Edition): Primary Tumor (pT): pT1c. Regional Lymph Nodes (pN): pN0. Distant Metastases (pM): pMX.   Diagnosis 1. Breast, left, needle core biopsy, 5:30 o'clock - INVASIVE DUCTAL CARCINOMA - SEE COMMENT 2. Lymph node, needle/core biopsy, left axillary - NO CARCINOMA IDENTIFIED IN ONE LYMPH NODE (0/1) - SEE COMMENT Microscopic Comment 1. Based on the biopsy, the carcinoma appears Nottingham grade 3 of 3 and measures 1.2 cm in greatest linear extent. Prognostic markers (ER/PR/ki-67/HER2-FISH) are pending and will be reported in an addendum. Dr. Lyndon Code has reviewed the case and agrees with above diagnosis. These results were called to [facility] on June 10, 2017. 2. Deeper levels were examined. Cytokeratin AE1/3 is negative supporting the above diagnosis. 1. PROGNOSTIC INDICATORS Results: IMMUNOHISTOCHEMICAL AND MORPHOMETRIC ANALYSIS PERFORMED MANUALLY Estrogen Receptor: 0%, NEGATIVE Progesterone Receptor: 0%, NEGATIVE Proliferation Marker Ki67: 70%   RADIOGRAPHIC STUDIES: I have personally reviewed the radiological images as listed and agreed with the findings in the report. No results found.   ASSESSMENT & PLAN: Belynda Pagaduan is a 62 y.o. female with a history of HTN, COPD, retinitis pigmentosa, and CHF with EF 45-50%, presented with screening discovered left breast cancer.   1. 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  She has completed 3 cycles of adjuvant TC.  She is tolerating well overall.  Labs reviewed, CBC and CMP adequate to proceed with 4th and final cycle today.  She will be referred back to radiation oncology for adjuvant RT.  She will follow-up towards the end of her planned radiation course, in 6 weeks.    2. Genetics  -Negative for gene  mutations  3. Chronic combined systolic and diastolic heart failure, EF 45-50% -Followed by cardiology Dr. Ellyn Hack, will repeat echo after chemotherapy.  Currently holding Lasix and Aldactone.  Has not taken any of her medication in the last 2 days.  I encouraged her to restart meds today when she gets home.  4. HTN, COPD, retinitis pigmentosa with limited vision -BP markedly elevated today, 150/102 manual and confirmed twice.  Asymptomatic.  She will restart medication upon returning home today.  5. Financial support -Has financial concerns about radiation; I encouraged her to call our financial advocate to discuss.  6.  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  Refilled for her today  PLAN -Labs reviewed, proceed with cycle 4 adjuvant TC today  -Referred back to Dr. Lisbeth Renshaw for adjuvant radiation -Follow-up in 6 weeks, towards the end of planned RT course -Refilled acyclovir   Orders Placed This Encounter  Procedures  . Ambulatory referral to Radiation Oncology    Referral Priority:   Routine    Referral Type:   Consultation    Referral Reason:   Specialty Services Required    Requested Specialty:   Radiation Oncology    Number of Visits Requested:   1   All questions were answered. The patient knows to call the clinic with any problems, questions or concerns. No barriers to learning was detected.     Alla Feeling, NP 09/29/17

## 2017-09-29 NOTE — Patient Instructions (Signed)
Cancer Center Discharge Instructions for Patients Receiving Chemotherapy  Today you received the following chemotherapy agents:  Taxotere, Cytoxan  To help prevent nausea and vomiting after your treatment, we encourage you to take your nausea medication as prescribed.   If you develop nausea and vomiting that is not controlled by your nausea medication, call the clinic.   BELOW ARE SYMPTOMS THAT SHOULD BE REPORTED IMMEDIATELY:  *FEVER GREATER THAN 100.5 F  *CHILLS WITH OR WITHOUT FEVER  NAUSEA AND VOMITING THAT IS NOT CONTROLLED WITH YOUR NAUSEA MEDICATION  *UNUSUAL SHORTNESS OF BREATH  *UNUSUAL BRUISING OR BLEEDING  TENDERNESS IN MOUTH AND THROAT WITH OR WITHOUT PRESENCE OF ULCERS  *URINARY PROBLEMS  *BOWEL PROBLEMS  UNUSUAL RASH Items with * indicate a potential emergency and should be followed up as soon as possible.  Feel free to call the clinic should you have any questions or concerns. The clinic phone number is [phone].  Please show the CHEMO ALERT CARD at check-in to the Emergency Department and triage nurse.     

## 2017-10-01 ENCOUNTER — Encounter: Payer: Self-pay | Admitting: Family Medicine

## 2017-10-01 ENCOUNTER — Encounter: Payer: Self-pay | Admitting: Radiation Oncology

## 2017-10-06 ENCOUNTER — Other Ambulatory Visit: Payer: Self-pay | Admitting: Hematology

## 2017-10-06 ENCOUNTER — Inpatient Hospital Stay: Payer: Medicare HMO

## 2017-10-06 VITALS — BP 119/75 | HR 89 | Temp 98.2°F | Resp 17

## 2017-10-06 DIAGNOSIS — Z95828 Presence of other vascular implants and grafts: Secondary | ICD-10-CM

## 2017-10-06 DIAGNOSIS — Z171 Estrogen receptor negative status [ER-]: Principal | ICD-10-CM

## 2017-10-06 DIAGNOSIS — C50512 Malignant neoplasm of lower-outer quadrant of left female breast: Secondary | ICD-10-CM

## 2017-10-06 DIAGNOSIS — E860 Dehydration: Secondary | ICD-10-CM

## 2017-10-06 DIAGNOSIS — Z5111 Encounter for antineoplastic chemotherapy: Secondary | ICD-10-CM | POA: Diagnosis not present

## 2017-10-06 LAB — CBC WITH DIFFERENTIAL/PLATELET
BASOS PCT: 0 %
Basophils Absolute: 0 10*3/uL (ref 0.0–0.1)
EOS ABS: 0.4 10*3/uL (ref 0.0–0.5)
Eosinophils Relative: 4 %
HCT: 30 % — ABNORMAL LOW (ref 34.8–46.6)
HEMOGLOBIN: 9.9 g/dL — AB (ref 11.6–15.9)
Lymphocytes Relative: 16 %
Lymphs Abs: 1.5 10*3/uL (ref 0.9–3.3)
MCH: 30.9 pg (ref 25.1–34.0)
MCHC: 33 g/dL (ref 31.5–36.0)
MCV: 93.9 fL (ref 79.5–101.0)
Monocytes Absolute: 1.2 10*3/uL — ABNORMAL HIGH (ref 0.1–0.9)
Monocytes Relative: 13 %
NEUTROS PCT: 67 %
Neutro Abs: 6.5 10*3/uL (ref 1.5–6.5)
PLATELETS: 310 10*3/uL (ref 145–400)
RBC: 3.2 MIL/uL — ABNORMAL LOW (ref 3.70–5.45)
RDW: 17.6 % — ABNORMAL HIGH (ref 11.2–14.5)
WBC: 9.7 10*3/uL (ref 3.9–10.3)

## 2017-10-06 LAB — COMPREHENSIVE METABOLIC PANEL
ALT: 23 U/L (ref 0–55)
AST: 21 U/L (ref 5–34)
Albumin: 3.5 g/dL (ref 3.5–5.0)
Alkaline Phosphatase: 136 U/L (ref 40–150)
Anion gap: 7 (ref 3–11)
BILIRUBIN TOTAL: 0.4 mg/dL (ref 0.2–1.2)
BUN: 13 mg/dL (ref 7–26)
CO2: 25 mmol/L (ref 22–29)
CREATININE: 0.77 mg/dL (ref 0.60–1.10)
Calcium: 9.1 mg/dL (ref 8.4–10.4)
Chloride: 111 mmol/L — ABNORMAL HIGH (ref 98–109)
Glucose, Bld: 156 mg/dL — ABNORMAL HIGH (ref 70–140)
Potassium: 3.4 mmol/L — ABNORMAL LOW (ref 3.5–5.1)
Sodium: 143 mmol/L (ref 136–145)
Total Protein: 6.6 g/dL (ref 6.4–8.3)

## 2017-10-06 MED ORDER — SODIUM CHLORIDE 0.9 % IV SOLN
INTRAVENOUS | Status: DC
  Administered 2017-10-06: 13:00:00 via INTRAVENOUS

## 2017-10-06 MED ORDER — SODIUM CHLORIDE 0.9% FLUSH
10.0000 mL | INTRAVENOUS | Status: DC | PRN
  Administered 2017-10-06: 10 mL via INTRAVENOUS
  Filled 2017-10-06: qty 10

## 2017-10-06 MED ORDER — HEPARIN SOD (PORK) LOCK FLUSH 100 UNIT/ML IV SOLN
500.0000 [IU] | Freq: Once | INTRAVENOUS | Status: AC | PRN
  Administered 2017-10-06: 500 [IU] via INTRAVENOUS
  Filled 2017-10-06: qty 5

## 2017-10-06 MED ORDER — SODIUM CHLORIDE 0.9 % IV SOLN: INTRAVENOUS | Status: AC

## 2017-10-06 NOTE — Patient Instructions (Signed)
Dehydration, Adult Dehydration is when there is not enough fluid or water in your body. This happens when you lose more fluids than you take in. Dehydration can range from mild to very bad. It should be treated right away to keep it from getting very bad. Symptoms of mild dehydration may include:  Thirst.  Dry lips.  Slightly dry mouth.  Dry, warm skin.  Dizziness. Symptoms of moderate dehydration may include:  Very dry mouth.  Muscle cramps.  Dark pee (urine). Pee may be the color of tea.  Your body making less pee.  Your eyes making fewer tears.  Heartbeat that is uneven or faster than normal (palpitations).  Headache.  Light-headedness, especially when you stand up from sitting.  Fainting (syncope). Symptoms of very bad dehydration may include:  Changes in skin, such as: ? Cold and clammy skin. ? Blotchy (mottled) or pale skin. ? Skin that does not quickly return to normal after being lightly pinched and let go (poor skin turgor).  Changes in body fluids, such as: ? Feeling very thirsty. ? Your eyes making fewer tears. ? Not sweating when body temperature is high, such as in hot weather. ? Your body making very little pee.  Changes in vital signs, such as: ? Weak pulse. ? Pulse that is more than 100 beats a minute when you are sitting still. ? Fast breathing. ? Low blood pressure.  Other changes, such as: ? Sunken eyes. ? Cold hands and feet. ? Confusion. ? Lack of energy (lethargy). ? Trouble waking up from sleep. ? Short-term weight loss. ? Unconsciousness. Follow these instructions at home:  If told by your doctor, drink an ORS: ? Make an ORS by using instructions on the package. ? Start by drinking small amounts, about  cup (120 mL) every 5-10 minutes. ? Slowly drink more until you have had the amount that your doctor said to have.  Drink enough clear fluid to keep your pee clear or pale yellow. If you were told to drink an ORS, finish the ORS  first, then start slowly drinking clear fluids. Drink fluids such as: ? Water. Do not drink only water by itself. Doing that can make the salt (sodium) level in your body get too low (hyponatremia). ? Ice chips. ? Fruit juice that you have added water to (diluted). ? Low-calorie sports drinks.  Avoid: ? Alcohol. ? Drinks that have a lot of sugar. These include high-calorie sports drinks, fruit juice that does not have water added, and soda. ? Caffeine. ? Foods that are greasy or have a lot of fat or sugar.  Take over-the-counter and prescription medicines only as told by your doctor.  Do not take salt tablets. Doing that can make the salt level in your body get too high (hypernatremia).  Eat foods that have minerals (electrolytes). Examples include bananas, oranges, potatoes, tomatoes, and spinach.  Keep all follow-up visits as told by your doctor. This is important. Contact a doctor if:  You have belly (abdominal) pain that: ? Gets worse. ? Stays in one area (localizes).  You have a rash.  You have a stiff neck.  You get angry or annoyed more easily than normal (irritability).  You are more sleepy than normal.  You have a harder time waking up than normal.  You feel: ? Weak. ? Dizzy. ? Very thirsty.  You have peed (urinated) only a small amount of very dark pee during 6-8 hours. Get help right away if:  You have symptoms of   very bad dehydration.  You cannot drink fluids without throwing up (vomiting).  Your symptoms get worse with treatment.  You have a fever.  You have a very bad headache.  You are throwing up or having watery poop (diarrhea) and it: ? Gets worse. ? Does not go away.  You have blood or something green (bile) in your throw-up.  You have blood in your poop (stool). This may cause poop to look black and tarry.  You have not peed in 6-8 hours.  You pass out (faint).  Your heart rate when you are sitting still is more than 100 beats a  minute.  You have trouble breathing. This information is not intended to replace advice given to you by your health care provider. Make sure you discuss any questions you have with your health care provider. Document Released: 05/25/2009 Document Revised: 02/16/2016 Document Reviewed: 09/22/2015 Elsevier Interactive Patient Education  2018 Elsevier Inc.  

## 2017-10-14 DIAGNOSIS — H179 Unspecified corneal scar and opacity: Secondary | ICD-10-CM | POA: Diagnosis not present

## 2017-10-14 DIAGNOSIS — H21541 Posterior synechiae (iris), right eye: Secondary | ICD-10-CM | POA: Diagnosis not present

## 2017-10-14 DIAGNOSIS — H2511 Age-related nuclear cataract, right eye: Secondary | ICD-10-CM | POA: Diagnosis not present

## 2017-10-14 DIAGNOSIS — H3552 Pigmentary retinal dystrophy: Secondary | ICD-10-CM | POA: Diagnosis not present

## 2017-10-16 ENCOUNTER — Ambulatory Visit: Payer: Medicare HMO

## 2017-10-16 ENCOUNTER — Ambulatory Visit
Admission: RE | Admit: 2017-10-16 | Discharge: 2017-10-16 | Disposition: A | Discharge status: Home / Self Care | Payer: Medicare HMO | Source: Ambulatory Visit | Attending: Radiation Oncology | Admitting: Radiation Oncology

## 2017-10-16 DIAGNOSIS — Z171 Estrogen receptor negative status [ER-]: Principal | ICD-10-CM

## 2017-10-16 DIAGNOSIS — C50512 Malignant neoplasm of lower-outer quadrant of left female breast: Secondary | ICD-10-CM

## 2017-10-18 ENCOUNTER — Other Ambulatory Visit: Payer: Self-pay | Admitting: Cardiology

## 2017-10-20 ENCOUNTER — Other Ambulatory Visit: Payer: Self-pay

## 2017-10-20 DIAGNOSIS — H3552 Pigmentary retinal dystrophy: Secondary | ICD-10-CM | POA: Diagnosis not present

## 2017-10-20 DIAGNOSIS — H2513 Age-related nuclear cataract, bilateral: Secondary | ICD-10-CM | POA: Diagnosis not present

## 2017-10-20 DIAGNOSIS — H16043 Marginal corneal ulcer, bilateral: Secondary | ICD-10-CM | POA: Diagnosis not present

## 2017-10-20 DIAGNOSIS — H35353 Cystoid macular degeneration, bilateral: Secondary | ICD-10-CM | POA: Diagnosis not present

## 2017-10-20 DIAGNOSIS — H43812 Vitreous degeneration, left eye: Secondary | ICD-10-CM | POA: Diagnosis not present

## 2017-10-20 DIAGNOSIS — H20013 Primary iridocyclitis, bilateral: Secondary | ICD-10-CM | POA: Diagnosis not present

## 2017-10-20 MED ORDER — SPIRONOLACTONE 25 MG PO TABS: 25.0000 mg | ORAL_TABLET | Freq: Every day | ORAL | 1 refills | Status: DC

## 2017-10-31 NOTE — Progress Notes (Signed)
[place]  [phone] [phone]  Clinic Follow up Note   Patient Care Team: Girtha Rm, NP-C as PCP - General (Family Medicine) [name], MD as Consulting Physician (Hematology) [name], MD as Consulting Physician (General Surgery)   Date of Service:  11/03/2017  CHIEF COMPLAINTS:  Malignant neoplasm of lower-outer quadrant of left breast of female, triple negative    Oncology History   Cancer Staging Malignant neoplasm of lower-outer quadrant of left breast of female, estrogen receptor negative ([place]) Staging form: Breast, AJCC 8th Edition - Clinical stage from 06/06/2017: Stage IB (cT1c, cN0, cM0, G3, ER: Negative, PR: Negative, HER2: Negative) - Signed by [name], MD on 06/15/2017 - Pathologic stage from 06/26/2017: Stage IB (pT1c, pN0, cM0, G3, ER: Negative, PR: Negative, HER2: Negative) - Signed by [name], MD on 07/10/2017       Malignant neoplasm of lower-outer quadrant of left breast of female, estrogen receptor negative ([place])   05/30/2017 Mammogram    Diagnostic mammo and US IMPRESSION: 1. Highly suspicious 1.4 cm mass in the slightly lower slightly outer left breast -tissue sampling recommended. 2. Indeterminate 0.5 mm mass in the slightly lower slightly outer left breast -tissue sampling recommended. 3. At least 2 left axillary lymph nodes with borderline cortical thickness.      06/06/2017 Initial Biopsy    Diagnosis 1. Breast, left, needle core biopsy, 5:30 o'clock - INVASIVE DUCTAL CARCINOMA, G3 2. Lymph node, needle/core biopsy, left axillary - NO CARCINOMA IDENTIFIED IN ONE LYMPH NODE (0/1)      06/06/2017 Initial Diagnosis    Malignant neoplasm of lower-outer quadrant of left breast of female, estrogen receptor negative ([place])      06/06/2017 Receptors her2    Estrogen Receptor: 0%, NEGATIVE Progesterone Receptor: 0%, NEGATIVE Proliferation Marker Ki67: 70%      06/26/2017 Surgery    LEFT  BREAST LUMPECTOMY WITH RADIOACTIVE SEED AND SENTINEL LYMPH NODE BIOPSY ERAS  PATHWAY AND INSERTION PORT-A-CATH By Dr. Barry Dienes on 06/26/17       06/26/2017 Pathology Results    Diagnosis 06/26/17  1. Breast, lumpectomy, Left - INVASIVE DUCTAL CARCINOMA, GRADE III/III, SPANNING 1.2 CM. - THE SURGICAL RESECTION MARGINS ARE NEGATIVE FOR CARCINOMA. - SEE ONCOLOGY TABLE BELOW. 2. Lymph node, sentinel, biopsy, Left axillary #1 - THERE IS NO EVIDENCE OF CARCINOMA IN 1 OF 1 LYMPH NODE (0/1). 3. Lymph node, sentinel, biopsy, Left axillary #2 - THERE IS NO EVIDENCE OF CARCINOMA IN 1 OF 1 LYMPH NODE (0/1). 4. Lymph node, sentinel, biopsy, Left axillary #3 - THERE IS NO EVIDENCE OF CARCINOMA IN 1 OF 1 LYMPH NODE (0/1).       07/25/2017 - 09/29/2017 Chemotherapy    Adjuvant cytoxan and docetaxel (TC) every 3 weekd for 4 cycles         HISTORY OF PRESENTING ILLNESS: 06/18/17 Kristin [facility] 62 y.o. female is here because of newly diagnosed left breast cancer. She presents to Breast Clinic today accompanied by her daughter.   She found this mass by screening mammogram.  She denies palpable breast mass before biopsy.  She thinks her current palpable mass is a keloid post biopsy.  She denies any other new symptoms.  No change of her energy level, weight, or appetite.  In the past she had HTN, possible COPD, and her cousin had breast cancer and her aunt had colon cancer. She quit smoking after 23 years She smoked a pack a week. She is can see moderately and  has had cornea surgery. She walks with a cane. She had retinitis pigmentosa She has medication for this. She has CHF and sees a cardiologist and is on medication, Norvasc and coreg. She had lung nodules last year and she had it monitored.    GYN HISTORY  Menarchal: 11 LMP: 2015 Contraceptive: yes HRT: No G2P2: one son and one daughter    CURRENT THERAPY: Pending Radiation therapy   INTERVAL HISTORY:  K[facility]in [facility] is here for a  follow up. She presents to the clinic today accompanied by her daughter. She reports she is doing well overall. She states her left breast has been swollen for 4 days and is mildly tender. She states he is going to start radiation therapy later this week, She reports her vision has improved and her only residual side effect is black nails.   On review of systems, pt denies fever, leg swelling or any other complaints at this time. Pertinent positives are listed and detailed within the above HPI.   MEDICAL HISTORY:  Past Medical History:  Diagnosis Date  . Abnormal myocardial perfusion study 03/15/2016   EF 15%, severe LV systolic dysfcn, global hypokinesis and inferior akinesis. dilated LV  . Abnormal x-ray of lungs with single pulmonary nodule 03/15/2016   per records from [place]   . Blind   . Cholelithiasis 05/19/2017   On CT  . COPD (chronic obstructive pulmonary disease) ([place])    PT DENIES  . Coronary artery calcification seen on CAT scan 05/19/2017  . Dilated idiopathic cardiomyopathy ([place]) 12/2015   EF 15-20%. Diagnosed in [facility]  . Family history of colon cancer   . Genetic testing 06/19/2017   Multi-Cancer panel (83 genes) @ [facility] - No pathogenic mutations detected  . Headache    NONE RECENT  . Hypertension   . Malignant neoplasm of lower-outer quadrant of left breast of female, estrogen receptor negative ([place]) 06/11/2017   left breast  . Retinitis pigmentosa   . Solitary pulmonary nodule on lung CT 03/16/2016   per records from [place]  . Uveitis     SURGICAL HISTORY: Past Surgical History:  Procedure Laterality Date  . brain cyst removed  2007   to help with headaches per patient , aspirated   . BREAST LUMPECTOMY WITH RADIOACTIVE SEED AND SENTINEL LYMPH NODE BIOPSY Left 06/26/2017   Procedure: BREAST LUMPECTOMY WITH RADIOACTIVE SEED AND SENTINEL LYMPH NODE BIOPSY ERAS  PATHWAY;  Surgeon: [name], MD;  Location: [facility];  Service: General;   Laterality: Left;  pec block  . NASAL TURBINATE REDUCTION    . PORTACATH PLACEMENT N/A 06/26/2017   Procedure: INSERTION PORT-A-CATH;  Surgeon: [name], MD;  Location: [facility];  Service: General;  Laterality: N/A;  . TRANSTHORACIC ECHOCARDIOGRAM  12/2015   [facility] May 2017: Mild concentric LVH. Global hypokinesis. GR daily. EF 18% severe LA dilation. Mitral annular dilatation with papillary muscle dysfunction and moderate MR. Dilated IVC consistent with elevated RAP.    SOCIAL HISTORY: Social History   Socioeconomic History  . Marital status: Widowed    Spouse name: Not on file  . Number of children: Not on file  . Years of education: Not on file  . Highest education level: Not on file  Occupational History  . Not on file  Social Needs  . Financial resource strain: Not on file  . Food insecurity:    Worry: Not on file    Inability: Not on file  . Transportation needs:  Medical: Not on file    Non-medical: Not on file  Tobacco Use  . Smoking status: Former Smoker    Packs/day: 0.25    Years: 23.00    Pack years: 5.75    Types: Cigarettes    Last attempt to quit: 10/09/2015    Years since quitting: 2.0  . Smokeless tobacco: Never Used  Substance and Sexual Activity  . Alcohol use: No  . Drug use: No  . Sexual activity: Never  Lifestyle  . Physical activity:    Days per week: Not on file    Minutes per session: Not on file  . Stress: Not on file  Relationships  . Social connections:    Talks on phone: Not on file    Gets together: Not on file    Attends religious service: Not on file    Active member of club or organization: Not on file    Attends meetings of clubs or organizations: Not on file    Relationship status: Not on file  . Intimate partner violence:    Fear of current or ex partner: Not on file    Emotionally abused: Not on file    Physically abused: Not on file    Forced sexual activity: Not on file  Other Topics Concern  . Not on  file  Social History Narrative   She is a widowed mother of 2 (daughter and son).   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   She moved to [place] apparently back in 2016, but was visiting family in [place] and was diagnosed with Cardiomyopathy.   She is accompanied by her daughter. She walks roughly 1-2 miles a day.    FAMILY HISTORY: Family History  Problem Relation Age of Onset  . Colon cancer Mother 28       deceased 62  . Heart attack Father   . Breast cancer Other 67       2nd cousin on maternal side; currently 83  . Pancreatic cancer Other        2nd cousin once removed on maternal side; deceased 80s    ALLERGIES:  is allergic to latex; tylenol with codeine #3 [acetaminophen-codeine]; and penicillins.  MEDICATIONS:  Current Outpatient Medications  Medication Sig Dispense Refill  . amLODipine (NORVASC) 5 MG tablet TAKE 1 TABLET BY MOUTH EVERY DAY 30 tablet 4  . aspirin 81 MG chewable tablet Chew 81 mg by mouth daily.    . carvedilol (COREG) 12.5 MG tablet TAKE 1 TABLET (12.5 MG TOTAL) BY MOUTH 2 (TWO) TIMES DAILY WITH A MEAL. 60 tablet 4  . furosemide (LASIX) 40 MG tablet TAKE 1 TABLET BY MOUTH EVERY DAY 30 tablet 4  . naproxen (NAPROSYN) 500 MG tablet Take 1 tablet (500 mg total) by mouth daily as needed for mild pain. 20 tablet 1  . oxyCODONE (OXY IR/ROXICODONE) 5 MG immediate release tablet Take 1-2 tablets (5-10 mg total) by mouth every 6 (six) hours as needed for moderate pain, severe pain or breakthrough pain. 20 tablet 0  . prednisoLONE acetate (PRED FORTE) 1 % ophthalmic suspension INSTILL 1 DROP INTO BOTH EYES 4 TIMES A DAY  1  . sacubitril-valsartan (ENTRESTO) 24-26 MG Take 1 tablet by mouth 2 (two) times daily. 60 tablet 5  . spironolactone (ALDACTONE) 25 MG tablet Take 1 tablet (25 mg total) by mouth daily. 90 tablet 1  . valACYclovir (VALTREX) 1000 MG tablet Take 1 tablet (1,000 mg total) by mouth daily. 7  tablet 6  . vitamin C (ASCORBIC ACID) 500 MG  tablet Take 1,000 mg 2 (two) times daily by mouth.    . cephALEXin (KEFLEX) 500 MG capsule Take 1 capsule (500 mg total) by mouth 3 (three) times daily. 21 capsule 0  . lidocaine-prilocaine (EMLA) cream Apply to affected area once (Patient not taking: Reported on 11/03/2017) 30 g 3  . ondansetron (ZOFRAN) 8 MG tablet Take 1 tablet (8 mg total) by mouth 2 (two) times daily as needed for refractory nausea / vomiting. Start on day 3 after chemo. (Patient not taking: Reported on 11/03/2017) 30 tablet 1  . prochlorperazine (COMPAZINE) 10 MG tablet Take 1 tablet (10 mg total) by mouth every 6 (six) hours as needed (Nausea or vomiting). (Patient not taking: Reported on 08/29/2017) 30 tablet 1   No current facility-administered medications for this visit.      REVIEW OF SYSTEMS:   Constitutional: Denies fevers, chills or abnormal night sweats Eyes: Denies blurriness of vision, double vision or watery eyes Ears, nose, mouth, throat, and face: Denies mucositis or sore throat (+) seasonal allergy symptoms, sneezing Respiratory: Denies cough, dyspnea or wheezes Cardiovascular: Denies palpitation, chest discomfort or lower extremity swelling Gastrointestinal:  Denies nausea, heartburn (+) diarrhea Skin: Denies abnormal skin rashes Lymphatics: Denies new lymphadenopathy or easy bruising Neurological:Denies numbness, tingling or new weaknesses Behavioral/Psych: Mood is stable, no new changes  All other systems were reviewed with the patient and are negative Breast: (+) limited ROM of left arm, right chest soreness from port, mild swelling to left breast  PHYSICAL EXAMINATION: ECOG PERFORMANCE STATUS: 1 - Symptomatic but completely ambulatory  Vitals:   11/03/17 1024  BP: (!) 164/92  Pulse: 80  Resp: 19  Temp: 97.8 F (36.6 C)  SpO2: 100%   Filed Weights   11/03/17 1024  Weight: 247 lb 3.2 oz (112.1 kg)    GENERAL:alert, no distress and comfortable SKIN: skin color, texture, turgor are normal,  no rashes or significant lesions EYES: normal, conjunctiva are pink and non-injected, sclera clear OROPHARYNX:no exudate, no erythema and lips, buccal mucosa, and tongue normal  NECK: supple, thyroid normal size, non-tender, without nodularity LYMPH:  no palpable lymphadenopathy in the cervical, axillary or inguinal LUNGS: clear to auscultation and percussion with normal breathing effort HEART: regular rate & rhythm and no murmurs and no lower extremity edema ABDOMEN:abdomen soft, non-tender and normal bowel sounds Musculoskeletal:no cyanosis of digits and no clubbing  PSYCH: alert & oriented x 3 with fluent speech NEURO: no focal motor/sensory deficits Breast: (+) s/p left lumpectomy: surgical incision in left breast and axilla healed well, no seroma or palpable mass. Left breast with mild edema and very mild faint erythema around the nipple and tendernes LOQ. Could be early cellulitis. Right breast exam benign  LABORATORY DATA:  I have reviewed the data as listed CBC Latest Ref Rng & Units 11/03/2017 10/06/2017 09/29/2017  WBC 3.9 - 10.3 K/uL 5.6 9.7 6.2  Hemoglobin 11.6 - 15.9 g/dL 10.9(L) 9.9(L) 10.1(L)  Hematocrit 34.8 - 46.6 % 34.0(L) 30.0(L) 30.5(L)  Platelets 145 - 400 K/uL 325 310 376    CMP Latest Ref Rng & Units 11/03/2017 10/06/2017 09/29/2017  Glucose 70 - 140 mg/dL 102 156(H) 89  BUN 7 - 26 mg/dL '11 13 7  ' Creatinine 0.60 - 1.10 mg/dL 0.80 0.77 0.79  Sodium 136 - 145 mmol/L 140 143 141  Potassium 3.5 - 5.1 mmol/L 4.0 3.4(L) 3.8  Chloride 98 - 109 mmol/L 110(H) 111(H) 110(H)  CO2  22 - 29 mmol/L '23 25 23  ' Calcium 8.4 - 10.4 mg/dL 9.3 9.1 9.5  Total Protein 6.4 - 8.3 g/dL 7.4 6.6 7.4  Total Bilirubin 0.2 - 1.2 mg/dL 0.5 0.4 0.5  Alkaline Phos 40 - 150 U/L 120 136 112  AST 5 - 34 U/L '21 21 21  ' ALT 0 - 55 U/L '14 23 11     ' PATHOLOGY  Diagnosis 06/26/17  1. Breast, lumpectomy, Left - INVASIVE DUCTAL CARCINOMA, GRADE III/III, SPANNING 1.2 CM. - THE SURGICAL RESECTION MARGINS  ARE NEGATIVE FOR CARCINOMA. - SEE ONCOLOGY TABLE BELOW. 2. Lymph node, sentinel, biopsy, Left axillary #1 - THERE IS NO EVIDENCE OF CARCINOMA IN 1 OF 1 LYMPH NODE (0/1). 3. Lymph node, sentinel, biopsy, Left axillary #2 - THERE IS NO EVIDENCE OF CARCINOMA IN 1 OF 1 LYMPH NODE (0/1). 4. Lymph node, sentinel, biopsy, Left axillary #3 - THERE IS NO EVIDENCE OF CARCINOMA IN 1 OF 1 LYMPH NODE (0/1). Microscopic Comment 1. BREAST, INVASIVE TUMOR Procedure: Seed localized lumpectomy. Laterality: Left. Tumor Size: 1.2 cm (gross measurement). Histologic Type: Ductal. Grade: III. Tubular Differentiation: 3. Nuclear Pleomorphism: 2. Mitotic Count: 3. Ductal Carcinoma in Situ (DCIS): Not identified. Extent of Tumor: Confined to breast parenchyma. Margins: Greater than 0.2 cm to all margins. Regional Lymph Nodes: Number of Lymph Nodes Examined: 3. Number of Sentinel Lymph Nodes Examined: 3. Lymph Nodes with Macrometastases: 0. Lymph Nodes with Micrometastases: 0. Lymph Nodes with Isolated Tumor Cells: 0. Breast Prognostic Profile: Case [phone]. Estrogen Receptor: 0%. 1 of 3 FINAL for [facility], [facility] ANN [phone]) Microscopic Comment(continued) Progesterone Receptor: 0%. Her2: No amplification was detected. Ki-67: 70%. Best tumor block for sendout testing: 1A or 1B. Pathologic Stage Classification (pTNM, AJCC 8th Edition): Primary Tumor (pT): pT1c. Regional Lymph Nodes (pN): pN0. Distant Metastases (pM): pMX.   Diagnosis 1. Breast, left, needle core biopsy, 5:30 o'clock - INVASIVE DUCTAL CARCINOMA - SEE COMMENT 2. Lymph node, needle/core biopsy, left axillary - NO CARCINOMA IDENTIFIED IN ONE LYMPH NODE (0/1) - SEE COMMENT Microscopic Comment 1. Based on the biopsy, the carcinoma appears Nottingham grade 3 of 3 and measures 1.2 cm in greatest linear extent. Prognostic markers (ER/PR/ki-67/HER2-FISH) are pending and will be reported in an addendum. Dr. Lyndon Code has reviewed  the case and agrees with above diagnosis. These results were called to [facility] on June 10, 2017. 2. Deeper levels were examined. Cytokeratin AE1/3 is negative supporting the above diagnosis. 1. PROGNOSTIC INDICATORS Results: IMMUNOHISTOCHEMICAL AND MORPHOMETRIC ANALYSIS PERFORMED MANUALLY Estrogen Receptor: 0%, NEGATIVE Progesterone Receptor: 0%, NEGATIVE Proliferation Marker Ki67: 70%   RADIOGRAPHIC STUDIES: I have personally reviewed the radiological images as listed and agreed with the findings in the report. No results found.  ASSESSMENT & PLAN:  K[facility] is a 62 y.o. female with a history of HTN, COPD, retinitis pigmentosa, and CHF with EF 45-50%, presented with screening discovered left breast cancer.   1. 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  I reviewed her surgical pathology findings in great detail, surgical margins were negative.  3 sentinel lymph nodes were negative. -We previously reviewed the risk of cancer recurrence after complete surgical resection, which is based on the stage and biology of cancer. She has early stage but very aggressive disease.  She has high risk for recurrence due to triple negative disease  -Due to the  high risk disease, I recommended adjuvant chemotherapy. We discussed adjuvant chemotherapy regimens, due to her cardiomyopathy, she is not a candidate for Adriamycin.  I suggest moderate intense chemo regiment docetaxel and Cytoxan every 2 weeks, for 4 cycles, due to her comorbidity.   -She completed adjuvant chemotherapy, tolerated moderately well, had an episode of syncope after chemo, and required dose reduction for last two cycles. -She has now recovered well from chemotherapy. -I previously reviewed her medication, and I recommend her to hold her Lasix and  amlodipine during chemo due to her low normal blood pressure, she will continue Coreg and spironolactone. She can start Lasix and amlodipine as of 11/03/17  -She had her consult with Rad Onc on 10/16/17 and is planning to start on 11/06/17.  -I discussed surveillance after she finishes radiation, We will see her every 3-6 months for the first 2 years with lab, physical exam, annual mammogram and then 6-12 months for the next 3 years.   -F/u in 3 months, she knows to call me with any questions or concerns   2. Genetics -Due to her age and triple negative breast cancer, and positive family history, she will be referred to genetics -Results were negative for any gene mutations.   3.  Chronic combined systolic and diastolic heart failure, EF 45-50% -Follow-up with her cardiologist Dr. Ellyn Hack  -repeat ECHO on 05/14/17 shows improved heart function  -She is on lasix and spironolactone, I may hold if she becomes dehydrated. She can continue aspirin.  -Will repeat ECHO after chemotherapy -I previously suggested if she drinks less she should cut back on her lasix to prevent dehydration. She should check her BP at home regularly. If her BP is low and she feels dizzy she should contact our clinic and hold lasix.   4. HTN, COPD, retinitis pigmentosa with limited vision -Follow-up with primary care physician -She is legally blind -She had worsening vision during chemo, recovered to her baseline now. -She can start Lasix and amlodipine as of 11/03/17 -I advised her to check her BP at home and follow up with her cardiologist.   5. Financial Support -She met with our financial office -She qualified for [employer]    6.Left breast Lymphadema or early cellulitis  -She has some mild left breast edema and mild erythema around her nipple on today's exam -I prescribed Keflex today (11/03/17). She can takes once a day for 7 days  -she will f/u with PT if she has persistent left breast edema   PLAN:  -Rad onc  consult on 3/28 -prescribed Keflex today for early left breast cellulitis  -f/u in 3 months  -Restart lasix and amlodipine    No orders of the defined types were placed in this encounter.   All questions were answered. The patient knows to call the clinic with any problems, questions or concerns.  I spent 20 minutes counseling the patient face to face. The total time spent in the appointment was 25 minutes and more than 50% was on counseling.   This document serves as a record of services personally performed by [name], MD. It was created on her behalf by Theresia Bough, a trained medical scribe. The creation of this record is based on the scribe's personal observations and the provider's statements to them.   I have reviewed the above documentation for accuracy and completeness, and I agree with the above.    [name], MD 11/03/2017 4:59 PM

## 2017-11-03 ENCOUNTER — Encounter: Payer: Self-pay | Admitting: Hematology

## 2017-11-03 ENCOUNTER — Inpatient Hospital Stay: Payer: Medicare HMO | Attending: Nurse Practitioner | Admitting: Hematology

## 2017-11-03 ENCOUNTER — Telehealth: Payer: Self-pay | Admitting: Hematology

## 2017-11-03 ENCOUNTER — Inpatient Hospital Stay: Payer: Medicare HMO

## 2017-11-03 VITALS — BP 164/92 | HR 80 | Temp 97.8°F | Resp 19 | Ht 67.0 in | Wt 247.2 lb

## 2017-11-03 DIAGNOSIS — Z452 Encounter for adjustment and management of vascular access device: Secondary | ICD-10-CM | POA: Insufficient documentation

## 2017-11-03 DIAGNOSIS — Z95828 Presence of other vascular implants and grafts: Secondary | ICD-10-CM

## 2017-11-03 DIAGNOSIS — Z171 Estrogen receptor negative status [ER-]: Principal | ICD-10-CM

## 2017-11-03 DIAGNOSIS — C50512 Malignant neoplasm of lower-outer quadrant of left female breast: Secondary | ICD-10-CM | POA: Insufficient documentation

## 2017-11-03 LAB — COMPREHENSIVE METABOLIC PANEL
ALBUMIN: 3.6 g/dL (ref 3.5–5.0)
ALT: 14 U/L (ref 0–55)
AST: 21 U/L (ref 5–34)
Alkaline Phosphatase: 120 U/L (ref 40–150)
Anion gap: 7 (ref 3–11)
BUN: 11 mg/dL (ref 7–26)
CO2: 23 mmol/L (ref 22–29)
CREATININE: 0.8 mg/dL (ref 0.60–1.10)
Calcium: 9.3 mg/dL (ref 8.4–10.4)
Chloride: 110 mmol/L — ABNORMAL HIGH (ref 98–109)
GFR calc Af Amer: >60 mL/min (ref >60)
GLUCOSE: 102 mg/dL (ref 70–140)
POTASSIUM: 4 mmol/L (ref 3.5–5.1)
Sodium: 140 mmol/L (ref 136–145)
TOTAL PROTEIN: 7.4 g/dL (ref 6.4–8.3)
Total Bilirubin: 0.5 mg/dL (ref 0.2–1.2)

## 2017-11-03 LAB — CBC WITH DIFFERENTIAL/PLATELET
Basophils Absolute: 0 10*3/uL (ref 0.0–0.1)
Basophils Relative: 0 %
Eosinophils Absolute: 0.3 10*3/uL (ref 0.0–0.5)
Eosinophils Relative: 6 %
HCT: 34 % — ABNORMAL LOW (ref 34.8–46.6)
Hemoglobin: 10.9 g/dL — ABNORMAL LOW (ref 11.6–15.9)
LYMPHS ABS: 1.6 10*3/uL (ref 0.9–3.3)
Lymphocytes Relative: 28 %
MCH: 29.9 pg (ref 25.1–34.0)
MCHC: 32.1 g/dL (ref 31.5–36.0)
MCV: 93.2 fL (ref 79.5–101.0)
MONO ABS: 0.5 10*3/uL (ref 0.1–0.9)
MONOS PCT: 9 %
NEUTROS ABS: 3.2 10*3/uL (ref 1.5–6.5)
NEUTROS PCT: 57 %
Platelets: 325 10*3/uL (ref 145–400)
RBC: 3.65 MIL/uL — ABNORMAL LOW (ref 3.70–5.45)
RDW: 14.6 % — AB (ref 11.2–14.5)
WBC: 5.6 10*3/uL (ref 3.9–10.3)

## 2017-11-03 MED ORDER — CEPHALEXIN 500 MG PO CAPS: 500.0000 mg | ORAL_CAPSULE | Freq: Three times a day (TID) | ORAL | 0 refills | Status: DC

## 2017-11-03 MED ORDER — SODIUM CHLORIDE 0.9% FLUSH
10.0000 mL | INTRAVENOUS | Status: DC | PRN
  Administered 2017-11-03: 10 mL via INTRAVENOUS
  Filled 2017-11-03: qty 10

## 2017-11-03 MED ORDER — HEPARIN SOD (PORK) LOCK FLUSH 100 UNIT/ML IV SOLN
500.0000 [IU] | Freq: Once | INTRAVENOUS | Status: AC | PRN
Start: 2017-11-03 — End: 2017-11-03
  Administered 2017-11-03: 500 [IU] via INTRAVENOUS
  Filled 2017-11-03: qty 5

## 2017-11-03 NOTE — Telephone Encounter (Signed)
Appointments scheduled AVS/printed per 3/25 los °

## 2017-11-04 NOTE — Progress Notes (Addendum)
Location of Breast Cancer: Lower-outer quadrant of left breast of female   Histology per Pathology Report:   Diagnosis 06-06-17  1. Breast, left, needle core biopsy, 5:30 o'clock - INVASIVE DUCTAL CARCINOMA - SEE COMMENT 2. Lymph node, needle/core biopsy, left axillary - NO CARCINOMA IDENTIFIED IN ONE LYMPH NODE (0/1) - SEE COMMENT   Receptor Status: ER(0 % -), PR (0 % -), Her2-neu (), Ki-(70 %)  Did patient present with symptoms (if so, please note symptoms) or was this found on screening mammography?:screening mammogram      Past/Anticipated interventions by surgeon, if any:  Diagnosis 06-26-17 Dr. [name] 1. Breast, lumpectomy, Left - INVASIVE DUCTAL CARCINOMA, GRADE III/III, SPANNING 1.2 CM. - THE SURGICAL RESECTION MARGINS ARE NEGATIVE FOR CARCINOMA. - SEE ONCOLOGY TABLE BELOW. 2. Lymph node, sentinel, biopsy, Left axillary #1 - THERE IS NO EVIDENCE OF CARCINOMA IN 1 OF 1 LYMPH NODE (0/1). 3. Lymph node, sentinel, biopsy, Left axillary #2 - THERE IS NO EVIDENCE OF CARCINOMA IN 1 OF 1 LYMPH NODE (0/1). 4. Lymph node, sentinel, biopsy, Left axillary #3 - THERE IS NO EVIDENCE OF CARCINOMA IN 1 OF 1 LYMPH NODE (0/1).   Insertion of port-a-cath 06-26-17 Receptor Status: ER(0 % -), PR (0 % -), Her2-neu (), Ki-(70 %)   Past/Anticipated interventions by medical oncology, if any : 07/25/2017 Chemotherapy   CT every 3 weekd for 4 cycles plan to start on 07/25/17   Lymphedema issues, if any:  Yes feels it has gone down, no discussion of PT when she saw Dr. Burr Medico on 11-03-17 ROM to left arm is good          Skin to left breast with redness Dr. Burr Medico started Keflex 500 mg po bid x 7 days 11-03-17      Saw Dr. Barry Dienes in December and will see her again in 6 months.  Pain issues, if any: No  SAFETY ISSUES:  Prior radiation? : No  Pacemaker/ICD? : No  Possible current pregnancy? : No  Is the patient on methotrexate? : No  Menarche  11  G 2 P 2    BC yes  Menopause  LMP  2015   HRT No G2P2: one son and one daughter   Current Complaints / other details: Family history of cancer mother colon cancer,breast cancer 2nd cousin on maternal side,Pancreatic cancer 2nd cousin  Wt Readings from Last 3 Encounters:  11/06/17 242 lb 6.4 oz (110 kg)  11/03/17 247 lb 3.2 oz (112.1 kg)  09/29/17 239 lb 11.2 oz (108.7 kg)  BP (!) 166/91 (BP Location: Right Arm, Patient Position: Sitting, Cuff Size: Normal)   Pulse 74   Temp 98.4 F (36.9 C) (Oral)   Resp 18   Ht '5\' 7"'  (1.702 m)   Wt 242 lb 6.4 oz (110 kg)   BMI 37.97 kg/m     Georgena Spurling, RN 11/04/2017,3:12 PM

## 2017-11-06 ENCOUNTER — Encounter: Payer: Self-pay | Admitting: Radiation Oncology

## 2017-11-06 ENCOUNTER — Other Ambulatory Visit: Payer: Self-pay

## 2017-11-06 ENCOUNTER — Ambulatory Visit
Admission: RE | Admit: 2017-11-06 | Discharge: 2017-11-06 | Disposition: A | Discharge status: Home / Self Care | Payer: Medicare HMO | Source: Ambulatory Visit | Attending: Radiation Oncology | Admitting: Radiation Oncology

## 2017-11-06 VITALS — BP 166/91 | HR 74 | Temp 98.4°F | Resp 18 | Ht 67.0 in | Wt 242.4 lb

## 2017-11-06 DIAGNOSIS — Z171 Estrogen receptor negative status [ER-]: Secondary | ICD-10-CM | POA: Diagnosis not present

## 2017-11-06 DIAGNOSIS — Z880 Allergy status to penicillin: Secondary | ICD-10-CM | POA: Diagnosis not present

## 2017-11-06 DIAGNOSIS — Z803 Family history of malignant neoplasm of breast: Secondary | ICD-10-CM | POA: Insufficient documentation

## 2017-11-06 DIAGNOSIS — Z79899 Other long term (current) drug therapy: Secondary | ICD-10-CM | POA: Diagnosis not present

## 2017-11-06 DIAGNOSIS — Z7982 Long term (current) use of aspirin: Secondary | ICD-10-CM | POA: Diagnosis not present

## 2017-11-06 DIAGNOSIS — Z959 Presence of cardiac and vascular implant and graft, unspecified: Secondary | ICD-10-CM | POA: Diagnosis not present

## 2017-11-06 DIAGNOSIS — Z800 Family history of malignant neoplasm of digestive organs: Secondary | ICD-10-CM | POA: Insufficient documentation

## 2017-11-06 DIAGNOSIS — Z886 Allergy status to analgesic agent status: Secondary | ICD-10-CM | POA: Insufficient documentation

## 2017-11-06 DIAGNOSIS — Z9221 Personal history of antineoplastic chemotherapy: Secondary | ICD-10-CM | POA: Diagnosis not present

## 2017-11-06 DIAGNOSIS — Z79891 Long term (current) use of opiate analgesic: Secondary | ICD-10-CM | POA: Insufficient documentation

## 2017-11-06 DIAGNOSIS — I10 Essential (primary) hypertension: Secondary | ICD-10-CM | POA: Insufficient documentation

## 2017-11-06 DIAGNOSIS — Z9889 Other specified postprocedural states: Secondary | ICD-10-CM | POA: Diagnosis not present

## 2017-11-06 DIAGNOSIS — Z87891 Personal history of nicotine dependence: Secondary | ICD-10-CM | POA: Insufficient documentation

## 2017-11-06 DIAGNOSIS — C50512 Malignant neoplasm of lower-outer quadrant of left female breast: Secondary | ICD-10-CM

## 2017-11-06 DIAGNOSIS — N6459 Other signs and symptoms in breast: Secondary | ICD-10-CM | POA: Diagnosis not present

## 2017-11-06 NOTE — Progress Notes (Signed)
Radiation Oncology         (336) [phone] ________________________________  Name: Kristin Ward        MRN: [id number]  Date of Service: 11/06/2017 DOB: [birth year]  OA:CZYSAY, Laurian Brim, NP-C  [name], MD     REFERRING PHYSICIAN: [name], MD   DIAGNOSIS: The encounter diagnosis was Malignant neoplasm of lower-outer quadrant of left breast of female, estrogen receptor negative ([place]).   HISTORY OF PRESENT ILLNESS: Kristin Ward is a 63 y.o. female originally seen in the multidisciplinary breast clinic for a new diagnosis of left breast cancer. The patient was noted to have a screening mammogram that revealed a mass in the left breast at 5:30. The mass on ultrasound was 1.4 x 1.1 x 1 cm  And she ha done axillary node that was enlarged. Adjacent to this she had a 5 mm cyst as well which was aspirated along with her left biopsy on 06/06/17. Final pathology of the cystic fluid was benign and her mass was consistent with a grade 3, triple negative, invasive ductal carcinoma. Her axilla was also sampled and was negative for malignancy. She underwent left lumpectomy with sentinel node biopsy on 06/26/17 revealing a grade 3 1.2 cm invasive ductal carcinoma with all margins negative for disease, and no involvement of the three sampled notes. She went on to begin adjuvant chemotherapy on 07/25/17, and her last treatment was on 09/29/17 to complete 4 cycles of taxotere/cytoxan. She comes today to discuss options of adjuvant radiotherapy.  PREVIOUS RADIATION THERAPY: No   PAST MEDICAL HISTORY:  Past Medical History:  Diagnosis Date  . Abnormal myocardial perfusion study 03/15/2016   EF 15%, severe LV systolic dysfcn, global hypokinesis and inferior akinesis. dilated LV  . Abnormal x-ray of lungs with single pulmonary nodule 03/15/2016   per records from [place]   . Blind   . Cholelithiasis 05/19/2017   On CT  . COPD (chronic obstructive pulmonary disease) ([place])    PT DENIES  . Coronary  artery calcification seen on CAT scan 05/19/2017  . Dilated idiopathic cardiomyopathy ([place]) 12/2015   EF 15-20%. Diagnosed in [facility]  . Family history of colon cancer   . Genetic testing 06/19/2017   Multi-Cancer panel (83 genes) @ [facility] - No pathogenic mutations detected  . Headache    NONE RECENT  . Hypertension   . Malignant neoplasm of lower-outer quadrant of left breast of female, estrogen receptor negative ([place]) 06/11/2017   left breast  . Retinitis pigmentosa   . Solitary pulmonary nodule on lung CT 03/16/2016   per records from [place]  . Uveitis        PAST SURGICAL HISTORY: Past Surgical History:  Procedure Laterality Date  . brain cyst removed  2007   to help with headaches per patient , aspirated   . BREAST LUMPECTOMY WITH RADIOACTIVE SEED AND SENTINEL LYMPH NODE BIOPSY Left 06/26/2017   Procedure: BREAST LUMPECTOMY WITH RADIOACTIVE SEED AND SENTINEL LYMPH NODE BIOPSY ERAS  PATHWAY;  Surgeon: [name], MD;  Location: [place];  Service: General;  Laterality: Left;  pec block  . NASAL TURBINATE REDUCTION    . PORTACATH PLACEMENT N/A 06/26/2017   Procedure: INSERTION PORT-A-CATH;  Surgeon: [name], MD;  Location: [place];  Service: General;  Laterality: N/A;  . TRANSTHORACIC ECHOCARDIOGRAM  12/2015   [facility] May 2017: Mild concentric LVH. Global hypokinesis. GR daily. EF 18% severe LA dilation. Mitral annular dilatation with papillary muscle dysfunction and moderate MR. Dilated  IVC consistent with elevated RAP.     FAMILY HISTORY:  Family History  Problem Relation Age of Onset  . Colon cancer Mother 45       deceased 36  . Heart attack Father   . Breast cancer Other 54       2nd cousin on maternal side; currently 14  . Pancreatic cancer Other        2nd cousin once removed on maternal side; deceased 4s     SOCIAL HISTORY:  reports that she quit smoking about 2 years ago. Her smoking use included cigarettes. She has a  5.75 pack-year smoking history. She has never used smokeless tobacco. She reports that she does not drink alcohol or use drugs. The patient is widowed and lives in [place]. She is accompanied by her daughter. 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 She relocated to [place] about 2 years ago and is accompanied by her daughter.  ALLERGIES: Latex; Tylenol with codeine #3 [acetaminophen-codeine]; and Penicillins   MEDICATIONS:  Current Outpatient Medications  Medication Sig Dispense Refill  . amLODipine (NORVASC) 5 MG tablet TAKE 1 TABLET BY MOUTH EVERY DAY 30 tablet 4  . aspirin 81 MG chewable tablet Chew 81 mg by mouth daily.    . carvedilol (COREG) 12.5 MG tablet TAKE 1 TABLET (12.5 MG TOTAL) BY MOUTH 2 (TWO) TIMES DAILY WITH A MEAL. 60 tablet 4  . cephALEXin (KEFLEX) 500 MG capsule Take 1 capsule (500 mg total) by mouth 3 (three) times daily. 21 capsule 0  . furosemide (LASIX) 40 MG tablet TAKE 1 TABLET BY MOUTH EVERY DAY 30 tablet 4  . lidocaine-prilocaine (EMLA) cream Apply to affected area once (Patient not taking: Reported on 11/03/2017) 30 g 3  . naproxen (NAPROSYN) 500 MG tablet Take 1 tablet (500 mg total) by mouth daily as needed for mild pain. 20 tablet 1  . ondansetron (ZOFRAN) 8 MG tablet Take 1 tablet (8 mg total) by mouth 2 (two) times daily as needed for refractory nausea / vomiting. Start on day 3 after chemo. (Patient not taking: Reported on 11/03/2017) 30 tablet 1  . oxyCODONE (OXY IR/ROXICODONE) 5 MG immediate release tablet Take 1-2 tablets (5-10 mg total) by mouth every 6 (six) hours as needed for moderate pain, severe pain or breakthrough pain. 20 tablet 0  . prednisoLONE acetate (PRED FORTE) 1 % ophthalmic suspension INSTILL 1 DROP INTO BOTH EYES 4 TIMES A DAY  1  . prochlorperazine (COMPAZINE) 10 MG tablet Take 1 tablet (10 mg total) by mouth every 6 (six) hours as needed (Nausea or vomiting). (Patient not taking:  Reported on 08/29/2017) 30 tablet 1  . sacubitril-valsartan (ENTRESTO) 24-26 MG Take 1 tablet by mouth 2 (two) times daily. 60 tablet 5  . spironolactone (ALDACTONE) 25 MG tablet Take 1 tablet (25 mg total) by mouth daily. 90 tablet 1  . valACYclovir (VALTREX) 1000 MG tablet Take 1 tablet (1,000 mg total) by mouth daily. 7 tablet 6  . vitamin C (ASCORBIC ACID) 500 MG tablet Take 1,000 mg 2 (two) times daily by mouth.     No current facility-administered medications for this encounter.      REVIEW OF SYSTEMS: On review of systems, the patient reports that she is doing well overall. She reports her sight is stable but this did worsen initially during chemotherapy. She reports several issues that bothered her like neuropathy, changes in stools, and fatigue during treatment  but these issues have resolved. She denies any chest pain, shortness of breath, cough, fevers, chills, night sweats, unintended weight changes. She denies any bowel or bladder disturbances, and denies abdominal pain, nausea or vomiting. She denies any new musculoskeletal or joint aches or pains. A complete review of systems is obtained and is otherwise negative.     PHYSICAL EXAM:  Wt Readings from Last 3 Encounters:  11/03/17 247 lb 3.2 oz (112.1 kg)  09/29/17 239 lb 11.2 oz (108.7 kg)  09/08/17 241 lb 8 oz (109.5 kg)   Temp Readings from Last 3 Encounters:  11/03/17 97.8 F (36.6 C) (Oral)  10/06/17 98.2 F (36.8 C) (Oral)  09/29/17 98.4 F (36.9 C) (Oral)   BP Readings from Last 3 Encounters:  11/03/17 (!) 164/92  10/06/17 119/75  09/29/17 (!) 150/102   Pulse Readings from Last 3 Encounters:  11/03/17 80  10/06/17 89  09/29/17 73     In general this is a well appearing African American female in no acute distress. She is alert and oriented x4 and appropriate throughout the examination. HEENT reveals that the patient is normocephalic, atraumatic. EOMs are intact. PERRLA. Skin is intact without any evidence of  gross lesions.  Cardiopulmonary assessment is negative for acute distress and she exhibits normal effort. The left breast is examined and reveals mild erythema without palpable warmth of the skin, this is noted along the lower aspect of the areola. She does have a well healed lumpectomy site without erythema or chest wall edema.    ECOG = 0  0 - Asymptomatic (Fully active, able to carry on all predisease activities without restriction)  1 - Symptomatic but completely ambulatory (Restricted in physically strenuous activity but ambulatory and able to carry out work of a light or sedentary nature. For example, light housework, office work)  2 - Symptomatic, <50% in bed during the day (Ambulatory and capable of all self care but unable to carry out any work activities. Up and about more than 50% of waking hours)  3 - Symptomatic, >50% in bed, but not bedbound (Capable of only limited self-care, confined to bed or chair 50% or more of waking hours)  4 - Bedbound (Completely disabled. Cannot carry on any self-care. Totally confined to bed or chair)  5 - Death   Eustace Pen MM, Creech RH, Tormey DC, et al. [id number]). "Toxicity and response criteria of the [facility]". [place] Oncol. 5 (6): 649-55    LABORATORY DATA:  Lab Results  Component Value Date   WBC 5.6 11/03/2017   HGB 10.9 (L) 11/03/2017   HCT 34.0 (L) 11/03/2017   MCV 93.2 11/03/2017   PLT 325 11/03/2017   Lab Results  Component Value Date   NA 140 11/03/2017   K 4.0 11/03/2017   CL 110 (H) 11/03/2017   CO2 23 11/03/2017   Lab Results  Component Value Date   ALT 14 11/03/2017   AST 21 11/03/2017   ALKPHOS 120 11/03/2017   BILITOT 0.5 11/03/2017      RADIOGRAPHY: No results found.     IMPRESSION/PLAN: 1. 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 Dr. Lisbeth Renshaw discusses the patient's recent course and prior surgical pathology. We discussed the role and  utility of radiation. The patient is interested in proceeding. Dr. Lisbeth Renshaw discusses the delivery and logistics of radiotherapy and anticipates a course of either 4 or 6 1/2 weeks with deep inspiration breath hold technique.  Dr. Lisbeth Renshaw does discuss his preference would be to offer 6 1/2 weeks but given her transportation needs and ability to have good compliance, he would agree with a 4 week regimen. Written consent is obtained and placed in the chart, a copy was provided to the patient. She will simulate next week.  2. Possible cellulitis. The patient will complete her course of Keflex. We will still plan as above.  In a visit lasting 25 minutes, greater than 50% of the time was spent face to face discussing her pathology and course, and coordinating the patient's care.  The above documentation reflects my direct findings during this shared patient visit. Please see the separate note by Dr. Lisbeth Renshaw on this date for the remainder of the patient's plan of care.    Carola Rhine, PAC

## 2017-11-06 NOTE — Addendum Note (Signed)
Encounter addended by: Malena Edman, RN on: 11/06/2017 10:35 AM  Actions taken: Charge Capture section accepted

## 2017-11-11 ENCOUNTER — Ambulatory Visit
Admission: RE | Admit: 2017-11-11 | Discharge: 2017-11-11 | Disposition: A | Discharge status: Home / Self Care | Payer: Medicare HMO | Source: Ambulatory Visit | Attending: Radiation Oncology | Admitting: Radiation Oncology

## 2017-11-11 DIAGNOSIS — Z171 Estrogen receptor negative status [ER-]: Secondary | ICD-10-CM | POA: Insufficient documentation

## 2017-11-11 DIAGNOSIS — C50512 Malignant neoplasm of lower-outer quadrant of left female breast: Secondary | ICD-10-CM | POA: Insufficient documentation

## 2017-11-11 DIAGNOSIS — Z510 Encounter for antineoplastic radiation therapy: Secondary | ICD-10-CM | POA: Insufficient documentation

## 2017-11-11 NOTE — Progress Notes (Signed)
  Radiation Oncology         (336) [phone] ________________________________  Name: Latesia Norrington Knieriem MRN: [id number]  Date: 11/11/2017  DOB: 1955/10/19  Optical Surface Tracking Plan:  Since intensity modulated radiotherapy (IMRT) and 3D conformal radiation treatment methods are predicated on accurate and precise positioning for treatment, intrafraction motion monitoring is medically necessary to ensure accurate and safe treatment delivery.  The ability to quantify intrafraction motion without excessive ionizing radiation dose can only be performed with optical surface tracking. Accordingly, surface imaging offers the opportunity to obtain 3D measurements of patient position throughout IMRT and 3D treatments without excessive radiation exposure.  I am ordering optical surface tracking for this patient's upcoming course of radiotherapy. ________________________________  Kyung Rudd, MD 11/11/2017 4:17 PM    Reference:   Ursula Alert, J, et al. Surface imaging-based analysis of intrafraction motion for breast radiotherapy patients.Journal of [place], n. 6, nov. 2014. ISSN [id number].   Available at: <http://www.jacmp.org/index.php/jacmp/article/view/4957>.

## 2017-11-11 NOTE — Progress Notes (Signed)
  Radiation Oncology         [phone] ________________________________  Name: Kristin Ward MRN: [id number]  Date: 11/11/2017  DOB: 1955/10/05   DIAGNOSIS:     ICD-10-CM   1. Malignant neoplasm of lower-outer quadrant of left breast of female, estrogen receptor negative ([facility]) C50.512    Z17.1     SIMULATION AND TREATMENT PLANNING NOTE  The patient presented for simulation prior to beginning her course of radiation treatment for her diagnosis of left-sided breast cancer. The patient was placed in a supine position on a breast board. A customized vac-lock bag was constructed and this complex treatment device will be used on a daily basis during her treatment. In this fashion, a CT scan was obtained through the chest area and an isocenter was placed near the chest wall within the breast.  The patient will be planned to receive a course of radiation initially to a dose of 42.5 Gy. This will consist of a whole breast radiotherapy technique. To accomplish this, 2 customized blocks have been designed which will correspond to medial and lateral whole breast tangent fields. This treatment will be accomplished at 2.5 Gy per fraction. A forward planning technique will also be evaluated to determine if this approach improves the plan. It is anticipated that the patient will then receive a 7.5 Gy boost to the seroma cavity which has been contoured. This will be accomplished at 2.5 Gy per fraction.   This initial treatment will consist of a 3-D conformal technique. The seroma has been contoured as the primary target structure. Additionally, dose volume histograms of both this target as well as the lungs and heart will also be evaluated. Such an approach is necessary to ensure that the target area is adequately covered while the nearby critical  normal structures are adequately spared.  Plan:  The final anticipated total dose therefore will correspond to 50 Gy.   Special treatment procedure was  performed today due to the extra time and effort required by myself to plan and prepare this patient for deep inspiration breath hold technique.  I have determined cardiac sparing to be of benefit to this patient to prevent long term cardiac damage due to radiation of the heart.  Bellows were placed on the patient's abdomen. To facilitate cardiac sparing, the patient was coached by the radiation therapists on breath hold techniques and breathing practice was performed. Practice waveforms were obtained. The patient was then scanned while maintaining breath hold in the treatment position.  This image was then transferred over to the imaging specialist. The imaging specialist then created a fusion of the free breathing and breath hold scans using the chest wall as the stable structure. I personally reviewed the fusion in axial, coronal and sagittal image planes.  Excellent cardiac sparing was obtained.  I felt the patient is an appropriate candidate for breath hold and the patient will be treated as such.  The image fusion was then reviewed with the patient to reinforce the necessity of reproducible breath hold.     _______________________________   Jodelle Gross, MD, PhD

## 2017-11-13 DIAGNOSIS — Z510 Encounter for antineoplastic radiation therapy: Secondary | ICD-10-CM | POA: Diagnosis not present

## 2017-11-13 DIAGNOSIS — Z171 Estrogen receptor negative status [ER-]: Secondary | ICD-10-CM | POA: Diagnosis not present

## 2017-11-13 DIAGNOSIS — C50512 Malignant neoplasm of lower-outer quadrant of left female breast: Secondary | ICD-10-CM | POA: Diagnosis not present

## 2017-11-17 ENCOUNTER — Telehealth: Payer: Self-pay | Admitting: *Deleted

## 2017-11-17 NOTE — Telephone Encounter (Signed)
Received call from pt stating that she is still having numbness in her toes although last chemo was 09/29/17.  Returned call & discussed symptoms.  Discussed good foot care & possibly adding in Vit B 6.  Informed that this is from her Taxotere most likely & may be slow going away.  Suggested she call if worse. Discussed with Dr Burr Medico & she was in agreement.

## 2017-11-18 ENCOUNTER — Ambulatory Visit
Admission: RE | Admit: 2017-11-18 | Discharge: 2017-11-18 | Disposition: A | Discharge status: Home / Self Care | Payer: Medicare HMO | Source: Ambulatory Visit | Attending: Radiation Oncology | Admitting: Radiation Oncology

## 2017-11-18 DIAGNOSIS — Z510 Encounter for antineoplastic radiation therapy: Secondary | ICD-10-CM | POA: Diagnosis not present

## 2017-11-18 DIAGNOSIS — Z171 Estrogen receptor negative status [ER-]: Secondary | ICD-10-CM | POA: Diagnosis not present

## 2017-11-18 DIAGNOSIS — C50512 Malignant neoplasm of lower-outer quadrant of left female breast: Secondary | ICD-10-CM | POA: Diagnosis not present

## 2017-11-19 ENCOUNTER — Ambulatory Visit
Admission: RE | Admit: 2017-11-19 | Discharge: 2017-11-19 | Disposition: A | Discharge status: Home / Self Care | Payer: Medicare HMO | Source: Ambulatory Visit | Attending: Radiation Oncology | Admitting: Radiation Oncology

## 2017-11-19 DIAGNOSIS — C50512 Malignant neoplasm of lower-outer quadrant of left female breast: Secondary | ICD-10-CM | POA: Diagnosis not present

## 2017-11-19 DIAGNOSIS — Z171 Estrogen receptor negative status [ER-]: Secondary | ICD-10-CM | POA: Diagnosis not present

## 2017-11-19 DIAGNOSIS — Z510 Encounter for antineoplastic radiation therapy: Secondary | ICD-10-CM | POA: Diagnosis not present

## 2017-11-20 ENCOUNTER — Ambulatory Visit
Admission: RE | Admit: 2017-11-20 | Discharge: 2017-11-20 | Disposition: A | Discharge status: Home / Self Care | Payer: Medicare HMO | Source: Ambulatory Visit | Attending: Radiation Oncology | Admitting: Radiation Oncology

## 2017-11-20 DIAGNOSIS — Z510 Encounter for antineoplastic radiation therapy: Secondary | ICD-10-CM | POA: Diagnosis not present

## 2017-11-20 DIAGNOSIS — C50512 Malignant neoplasm of lower-outer quadrant of left female breast: Secondary | ICD-10-CM | POA: Diagnosis not present

## 2017-11-20 DIAGNOSIS — Z171 Estrogen receptor negative status [ER-]: Secondary | ICD-10-CM | POA: Diagnosis not present

## 2017-11-21 ENCOUNTER — Ambulatory Visit
Admission: RE | Admit: 2017-11-21 | Discharge: 2017-11-21 | Disposition: A | Discharge status: Home / Self Care | Payer: Medicare HMO | Source: Ambulatory Visit | Attending: Radiation Oncology | Admitting: Radiation Oncology

## 2017-11-21 DIAGNOSIS — C50512 Malignant neoplasm of lower-outer quadrant of left female breast: Secondary | ICD-10-CM | POA: Diagnosis not present

## 2017-11-21 DIAGNOSIS — Z171 Estrogen receptor negative status [ER-]: Secondary | ICD-10-CM | POA: Diagnosis not present

## 2017-11-21 DIAGNOSIS — Z510 Encounter for antineoplastic radiation therapy: Secondary | ICD-10-CM | POA: Diagnosis not present

## 2017-11-24 ENCOUNTER — Ambulatory Visit
Admission: RE | Admit: 2017-11-24 | Discharge: 2017-11-24 | Disposition: A | Discharge status: Home / Self Care | Payer: Medicare HMO | Source: Ambulatory Visit | Attending: Radiation Oncology | Admitting: Radiation Oncology

## 2017-11-24 DIAGNOSIS — C50512 Malignant neoplasm of lower-outer quadrant of left female breast: Secondary | ICD-10-CM | POA: Diagnosis not present

## 2017-11-24 DIAGNOSIS — Z171 Estrogen receptor negative status [ER-]: Principal | ICD-10-CM

## 2017-11-24 DIAGNOSIS — Z510 Encounter for antineoplastic radiation therapy: Secondary | ICD-10-CM | POA: Diagnosis not present

## 2017-11-24 MED ORDER — RADIAPLEXRX EX GEL: Freq: Once | CUTANEOUS | Status: DC

## 2017-11-24 MED ORDER — ALRA NON-METALLIC DEODORANT (RAD-ONC)
1.0000 "application " | Freq: Once | TOPICAL | Status: AC
  Administered 2017-11-24: 1 via TOPICAL

## 2017-11-24 MED ORDER — RADIAPLEXRX EX GEL
Freq: Once | CUTANEOUS | Status: AC
  Administered 2017-11-24: 15:00:00 via TOPICAL

## 2017-11-24 MED ORDER — ALRA NON-METALLIC DEODORANT (RAD-ONC): 1.0000 "application " | Freq: Once | TOPICAL | Status: DC

## 2017-11-24 NOTE — Progress Notes (Signed)
Pt here for patient teaching.  Pt given Radiation and You booklet, skin care instructions, Alra deodorant and Radiaplex gel.  Reviewed areas of pertinence such as fatigue, hair loss, skin changes, breast tenderness and breast swelling . Pt able to give teach back of to pat skin and use unscented/gentle soap,apply Radiaplex bid, avoid applying anything to skin within 4 hours of treatment and to use an electric razor if they must shave. Pt verbalizes understanding of information given and will contact nursing with any questions or concerns.     Cori Razor, RN

## 2017-11-25 ENCOUNTER — Ambulatory Visit
Admission: RE | Admit: 2017-11-25 | Discharge: 2017-11-25 | Disposition: A | Discharge status: Home / Self Care | Payer: Medicare HMO | Source: Ambulatory Visit | Attending: Radiation Oncology | Admitting: Radiation Oncology

## 2017-11-25 DIAGNOSIS — Z171 Estrogen receptor negative status [ER-]: Secondary | ICD-10-CM | POA: Diagnosis not present

## 2017-11-25 DIAGNOSIS — C50512 Malignant neoplasm of lower-outer quadrant of left female breast: Secondary | ICD-10-CM | POA: Diagnosis not present

## 2017-11-25 DIAGNOSIS — Z510 Encounter for antineoplastic radiation therapy: Secondary | ICD-10-CM | POA: Diagnosis not present

## 2017-11-26 ENCOUNTER — Ambulatory Visit
Admission: RE | Admit: 2017-11-26 | Discharge: 2017-11-26 | Disposition: A | Discharge status: Home / Self Care | Payer: Medicare HMO | Source: Ambulatory Visit | Attending: Radiation Oncology | Admitting: Radiation Oncology

## 2017-11-26 DIAGNOSIS — Z171 Estrogen receptor negative status [ER-]: Secondary | ICD-10-CM | POA: Diagnosis not present

## 2017-11-26 DIAGNOSIS — Z510 Encounter for antineoplastic radiation therapy: Secondary | ICD-10-CM | POA: Diagnosis not present

## 2017-11-26 DIAGNOSIS — C50512 Malignant neoplasm of lower-outer quadrant of left female breast: Secondary | ICD-10-CM | POA: Diagnosis not present

## 2017-11-27 ENCOUNTER — Ambulatory Visit
Admission: RE | Admit: 2017-11-27 | Discharge: 2017-11-27 | Disposition: A | Discharge status: Home / Self Care | Payer: Medicare HMO | Source: Ambulatory Visit | Attending: Radiation Oncology | Admitting: Radiation Oncology

## 2017-11-27 DIAGNOSIS — Z171 Estrogen receptor negative status [ER-]: Secondary | ICD-10-CM | POA: Diagnosis not present

## 2017-11-27 DIAGNOSIS — Z510 Encounter for antineoplastic radiation therapy: Secondary | ICD-10-CM | POA: Diagnosis not present

## 2017-11-27 DIAGNOSIS — C50512 Malignant neoplasm of lower-outer quadrant of left female breast: Secondary | ICD-10-CM | POA: Diagnosis not present

## 2017-11-28 ENCOUNTER — Ambulatory Visit
Admission: RE | Admit: 2017-11-28 | Discharge: 2017-11-28 | Disposition: A | Discharge status: Home / Self Care | Payer: Medicare HMO | Source: Ambulatory Visit | Attending: Radiation Oncology | Admitting: Radiation Oncology

## 2017-11-28 DIAGNOSIS — Z171 Estrogen receptor negative status [ER-]: Secondary | ICD-10-CM | POA: Diagnosis not present

## 2017-11-28 DIAGNOSIS — Z510 Encounter for antineoplastic radiation therapy: Secondary | ICD-10-CM | POA: Diagnosis not present

## 2017-11-28 DIAGNOSIS — C50512 Malignant neoplasm of lower-outer quadrant of left female breast: Secondary | ICD-10-CM | POA: Diagnosis not present

## 2017-12-01 ENCOUNTER — Ambulatory Visit
Admission: RE | Admit: 2017-12-01 | Discharge: 2017-12-01 | Disposition: A | Discharge status: Home / Self Care | Payer: Medicare HMO | Source: Ambulatory Visit | Attending: Radiation Oncology | Admitting: Radiation Oncology

## 2017-12-01 DIAGNOSIS — C50512 Malignant neoplasm of lower-outer quadrant of left female breast: Secondary | ICD-10-CM | POA: Diagnosis not present

## 2017-12-01 DIAGNOSIS — Z510 Encounter for antineoplastic radiation therapy: Secondary | ICD-10-CM | POA: Diagnosis not present

## 2017-12-01 DIAGNOSIS — Z171 Estrogen receptor negative status [ER-]: Secondary | ICD-10-CM | POA: Diagnosis not present

## 2017-12-02 ENCOUNTER — Ambulatory Visit
Admission: RE | Admit: 2017-12-02 | Discharge: 2017-12-02 | Disposition: A | Discharge status: Home / Self Care | Payer: Medicare HMO | Source: Ambulatory Visit | Attending: Radiation Oncology | Admitting: Radiation Oncology

## 2017-12-02 DIAGNOSIS — Z171 Estrogen receptor negative status [ER-]: Secondary | ICD-10-CM | POA: Diagnosis not present

## 2017-12-02 DIAGNOSIS — C50512 Malignant neoplasm of lower-outer quadrant of left female breast: Secondary | ICD-10-CM | POA: Diagnosis not present

## 2017-12-02 DIAGNOSIS — Z510 Encounter for antineoplastic radiation therapy: Secondary | ICD-10-CM | POA: Diagnosis not present

## 2017-12-03 ENCOUNTER — Ambulatory Visit
Admission: RE | Admit: 2017-12-03 | Discharge: 2017-12-03 | Disposition: A | Discharge status: Home / Self Care | Payer: Medicare HMO | Source: Ambulatory Visit | Attending: Radiation Oncology | Admitting: Radiation Oncology

## 2017-12-03 DIAGNOSIS — Z171 Estrogen receptor negative status [ER-]: Secondary | ICD-10-CM | POA: Diagnosis not present

## 2017-12-03 DIAGNOSIS — C50512 Malignant neoplasm of lower-outer quadrant of left female breast: Secondary | ICD-10-CM | POA: Diagnosis not present

## 2017-12-03 DIAGNOSIS — Z510 Encounter for antineoplastic radiation therapy: Secondary | ICD-10-CM | POA: Diagnosis not present

## 2017-12-04 ENCOUNTER — Ambulatory Visit: Payer: Medicare HMO

## 2017-12-05 ENCOUNTER — Ambulatory Visit
Admission: RE | Admit: 2017-12-05 | Discharge: 2017-12-05 | Disposition: A | Discharge status: Home / Self Care | Payer: Medicare HMO | Source: Ambulatory Visit | Attending: Radiation Oncology | Admitting: Radiation Oncology

## 2017-12-05 DIAGNOSIS — C50512 Malignant neoplasm of lower-outer quadrant of left female breast: Secondary | ICD-10-CM | POA: Diagnosis not present

## 2017-12-05 DIAGNOSIS — Z510 Encounter for antineoplastic radiation therapy: Secondary | ICD-10-CM | POA: Diagnosis not present

## 2017-12-05 DIAGNOSIS — Z171 Estrogen receptor negative status [ER-]: Secondary | ICD-10-CM | POA: Diagnosis not present

## 2017-12-08 ENCOUNTER — Ambulatory Visit
Admission: RE | Admit: 2017-12-08 | Discharge: 2017-12-08 | Disposition: A | Discharge status: Home / Self Care | Payer: Medicare HMO | Source: Ambulatory Visit | Attending: Radiation Oncology | Admitting: Radiation Oncology

## 2017-12-08 DIAGNOSIS — Z171 Estrogen receptor negative status [ER-]: Secondary | ICD-10-CM | POA: Diagnosis not present

## 2017-12-08 DIAGNOSIS — C50512 Malignant neoplasm of lower-outer quadrant of left female breast: Secondary | ICD-10-CM | POA: Diagnosis not present

## 2017-12-08 DIAGNOSIS — Z510 Encounter for antineoplastic radiation therapy: Secondary | ICD-10-CM | POA: Diagnosis not present

## 2017-12-09 ENCOUNTER — Ambulatory Visit
Admission: RE | Admit: 2017-12-09 | Discharge: 2017-12-09 | Disposition: A | Discharge status: Home / Self Care | Payer: Medicare HMO | Source: Ambulatory Visit | Attending: Radiation Oncology | Admitting: Radiation Oncology

## 2017-12-09 DIAGNOSIS — C50512 Malignant neoplasm of lower-outer quadrant of left female breast: Secondary | ICD-10-CM | POA: Diagnosis not present

## 2017-12-09 DIAGNOSIS — Z171 Estrogen receptor negative status [ER-]: Secondary | ICD-10-CM | POA: Diagnosis not present

## 2017-12-09 DIAGNOSIS — Z510 Encounter for antineoplastic radiation therapy: Secondary | ICD-10-CM | POA: Diagnosis not present

## 2017-12-10 ENCOUNTER — Ambulatory Visit
Admission: RE | Admit: 2017-12-10 | Discharge: 2017-12-10 | Disposition: A | Discharge status: Home / Self Care | Payer: Medicare HMO | Source: Ambulatory Visit | Attending: Radiation Oncology | Admitting: Radiation Oncology

## 2017-12-10 DIAGNOSIS — Z510 Encounter for antineoplastic radiation therapy: Secondary | ICD-10-CM | POA: Diagnosis present

## 2017-12-10 DIAGNOSIS — C50512 Malignant neoplasm of lower-outer quadrant of left female breast: Secondary | ICD-10-CM | POA: Insufficient documentation

## 2017-12-10 DIAGNOSIS — Z171 Estrogen receptor negative status [ER-]: Secondary | ICD-10-CM | POA: Insufficient documentation

## 2017-12-11 ENCOUNTER — Ambulatory Visit
Admission: RE | Admit: 2017-12-11 | Discharge: 2017-12-11 | Disposition: A | Discharge status: Home / Self Care | Payer: Medicare HMO | Source: Ambulatory Visit | Attending: Radiation Oncology | Admitting: Radiation Oncology

## 2017-12-11 DIAGNOSIS — C50512 Malignant neoplasm of lower-outer quadrant of left female breast: Secondary | ICD-10-CM | POA: Diagnosis not present

## 2017-12-11 DIAGNOSIS — Z510 Encounter for antineoplastic radiation therapy: Secondary | ICD-10-CM | POA: Diagnosis not present

## 2017-12-11 DIAGNOSIS — Z171 Estrogen receptor negative status [ER-]: Secondary | ICD-10-CM | POA: Diagnosis not present

## 2017-12-12 ENCOUNTER — Ambulatory Visit: Payer: Medicare HMO

## 2017-12-12 ENCOUNTER — Ambulatory Visit
Admission: RE | Admit: 2017-12-12 | Discharge: 2017-12-12 | Disposition: A | Discharge status: Home / Self Care | Payer: Medicare HMO | Source: Ambulatory Visit | Attending: Radiation Oncology | Admitting: Radiation Oncology

## 2017-12-12 DIAGNOSIS — Z171 Estrogen receptor negative status [ER-]: Secondary | ICD-10-CM | POA: Diagnosis not present

## 2017-12-12 DIAGNOSIS — Z510 Encounter for antineoplastic radiation therapy: Secondary | ICD-10-CM | POA: Diagnosis not present

## 2017-12-12 DIAGNOSIS — C50512 Malignant neoplasm of lower-outer quadrant of left female breast: Secondary | ICD-10-CM | POA: Diagnosis not present

## 2017-12-15 ENCOUNTER — Ambulatory Visit
Admission: RE | Admit: 2017-12-15 | Discharge: 2017-12-15 | Disposition: A | Discharge status: Home / Self Care | Payer: Medicare HMO | Source: Ambulatory Visit | Attending: Radiation Oncology | Admitting: Radiation Oncology

## 2017-12-15 ENCOUNTER — Ambulatory Visit: Payer: Medicare HMO

## 2017-12-15 DIAGNOSIS — Z510 Encounter for antineoplastic radiation therapy: Secondary | ICD-10-CM | POA: Diagnosis not present

## 2017-12-15 DIAGNOSIS — C50512 Malignant neoplasm of lower-outer quadrant of left female breast: Secondary | ICD-10-CM | POA: Diagnosis not present

## 2017-12-15 DIAGNOSIS — Z171 Estrogen receptor negative status [ER-]: Secondary | ICD-10-CM | POA: Diagnosis not present

## 2017-12-16 ENCOUNTER — Encounter: Payer: Self-pay | Admitting: *Deleted

## 2017-12-16 ENCOUNTER — Ambulatory Visit
Admission: RE | Admit: 2017-12-16 | Discharge: 2017-12-16 | Disposition: A | Discharge status: Home / Self Care | Payer: Medicare HMO | Source: Ambulatory Visit | Attending: Radiation Oncology | Admitting: Radiation Oncology

## 2017-12-16 ENCOUNTER — Ambulatory Visit: Payer: Medicare HMO

## 2017-12-16 DIAGNOSIS — Z171 Estrogen receptor negative status [ER-]: Secondary | ICD-10-CM | POA: Diagnosis not present

## 2017-12-16 DIAGNOSIS — Z510 Encounter for antineoplastic radiation therapy: Secondary | ICD-10-CM | POA: Diagnosis not present

## 2017-12-16 DIAGNOSIS — C50512 Malignant neoplasm of lower-outer quadrant of left female breast: Secondary | ICD-10-CM | POA: Diagnosis not present

## 2017-12-17 ENCOUNTER — Ambulatory Visit
Admission: RE | Admit: 2017-12-17 | Discharge: 2017-12-17 | Disposition: A | Discharge status: Home / Self Care | Payer: Medicare HMO | Source: Ambulatory Visit | Attending: Radiation Oncology | Admitting: Radiation Oncology

## 2017-12-17 DIAGNOSIS — C50512 Malignant neoplasm of lower-outer quadrant of left female breast: Secondary | ICD-10-CM | POA: Diagnosis not present

## 2017-12-17 DIAGNOSIS — Z171 Estrogen receptor negative status [ER-]: Secondary | ICD-10-CM | POA: Diagnosis not present

## 2017-12-17 DIAGNOSIS — Z510 Encounter for antineoplastic radiation therapy: Secondary | ICD-10-CM | POA: Diagnosis not present

## 2018-01-13 ENCOUNTER — Encounter: Payer: Self-pay | Admitting: Radiation Oncology

## 2018-01-13 NOTE — Progress Notes (Signed)
  Radiation Oncology         [phone]) [phone] ________________________________  Name: Kristin Ward MRN: [id number]  Date: 01/13/2018  DOB: Aug 23, 1955  End of Treatment Note  Diagnosis:   Malignant neoplasm of lower-outer quadrant of left breast of female, estrogen receptor negative     Indication for treatment:  Curative       Radiation treatment dates:   11/19/17 - 12/17/17  Site/dose:   Left breast treated to 42.5 Gy with 17 fx of 2.5 Gy followed by a boost of 7.5 Gy with 3 fx of 2.5 Gy  Beams/energy:   3D / 10X, 6X  Narrative: The patient tolerated radiation treatment relatively well.   She did endorse some intermittent shooting pains and itching in her breast area.  Plan: The patient has completed radiation treatment. The patient will return to radiation oncology clinic for routine followup in one month. I advised them to call or return sooner if they have any questions or concerns related to their recovery or treatment.  ------------------------------------------------  Jodelle Gross, MD, PhD  This document serves as a record of services personally performed by Kyung Rudd, MD. It was created on his behalf by Linward Natal, a trained medical scribe. The creation of this record is based on the scribe's personal observations and the provider's statements to them. This document has been checked and approved by the attending provider.

## 2018-01-26 ENCOUNTER — Encounter: Payer: Self-pay | Admitting: Radiation Oncology

## 2018-01-26 ENCOUNTER — Ambulatory Visit
Admission: RE | Admit: 2018-01-26 | Discharge: 2018-01-26 | Disposition: A | Discharge status: Home / Self Care | Payer: Medicare HMO | Source: Ambulatory Visit | Attending: Radiation Oncology | Admitting: Radiation Oncology

## 2018-01-26 ENCOUNTER — Other Ambulatory Visit: Payer: Self-pay

## 2018-01-26 VITALS — BP 143/95 | HR 78 | Temp 97.9°F | Resp 18 | Wt 239.4 lb

## 2018-01-26 DIAGNOSIS — Z7982 Long term (current) use of aspirin: Secondary | ICD-10-CM | POA: Diagnosis not present

## 2018-01-26 DIAGNOSIS — Z880 Allergy status to penicillin: Secondary | ICD-10-CM | POA: Insufficient documentation

## 2018-01-26 DIAGNOSIS — Z886 Allergy status to analgesic agent status: Secondary | ICD-10-CM | POA: Diagnosis not present

## 2018-01-26 DIAGNOSIS — C50512 Malignant neoplasm of lower-outer quadrant of left female breast: Secondary | ICD-10-CM

## 2018-01-26 DIAGNOSIS — Z885 Allergy status to narcotic agent status: Secondary | ICD-10-CM | POA: Insufficient documentation

## 2018-01-26 DIAGNOSIS — N644 Mastodynia: Secondary | ICD-10-CM | POA: Diagnosis not present

## 2018-01-26 DIAGNOSIS — Z79899 Other long term (current) drug therapy: Secondary | ICD-10-CM | POA: Insufficient documentation

## 2018-01-26 DIAGNOSIS — C50912 Malignant neoplasm of unspecified site of left female breast: Secondary | ICD-10-CM | POA: Insufficient documentation

## 2018-01-26 DIAGNOSIS — Z171 Estrogen receptor negative status [ER-]: Secondary | ICD-10-CM | POA: Insufficient documentation

## 2018-01-26 MED ORDER — GABAPENTIN 300 MG PO CAPS: 300.0000 mg | ORAL_CAPSULE | Freq: Three times a day (TID) | ORAL | 1 refills | Status: DC

## 2018-01-26 NOTE — Progress Notes (Signed)
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  Interval Since Last Radiation:  6 weeks   11/19/17 - 12/17/17: Left breast treated to 42.56 Gy with 17 fx of 2.5 Gy followed by a boost of 7.5 Gy with 3 fx of 2.5 Gy    Narrative:  The patient returns today for routine follow-up. During treatment she did very well with radiotherapy and did not have significant desquamation.                             On review of systems, the patient states she is doing pretty well, however still has some shooting pains through the left breast and into the nipple area.  She states that these come on several times a week, sometimes daily.  She states that they are self-limiting, and that she is also noticed some burning pain in her left axilla along the site of her previous surgery, as well as progressive neuropathy in her toes since starting radiation.  She states that this did not occur during the time of chemotherapy.  She notes that her areola on the left is more hyperpigmented than on the right.  No other complaints are verbalized.  ALLERGIES:  is allergic to latex; tylenol with codeine #3 [acetaminophen-codeine]; and penicillins.  Meds: Current Outpatient Medications  Medication Sig Dispense Refill  . aspirin 81 MG chewable tablet Chew 81 mg by mouth daily.    . carvedilol (COREG) 12.5 MG tablet TAKE 1 TABLET (12.5 MG TOTAL) BY MOUTH 2 (TWO) TIMES DAILY WITH A MEAL. 60 tablet 4  . furosemide (LASIX) 40 MG tablet TAKE 1 TABLET BY MOUTH EVERY DAY 30 tablet 4  . lidocaine-prilocaine (EMLA) cream Apply to affected area once 30 g 3  . naproxen (NAPROSYN) 500 MG tablet Take 1 tablet (500 mg total) by mouth daily as needed for  mild pain. 20 tablet 1  . sacubitril-valsartan (ENTRESTO) 24-26 MG Take 1 tablet by mouth 2 (two) times daily. 60 tablet 5  . spironolactone (ALDACTONE) 25 MG tablet Take 1 tablet (25 mg total) by mouth daily. 90 tablet 1  . vitamin C (ASCORBIC ACID) 500 MG tablet Take 1,000 mg 2 (two) times daily by mouth.    . cephALEXin (KEFLEX) 500 MG capsule Take 1 capsule (500 mg total) by mouth 3 (three) times daily. (Patient not taking: Reported on 01/26/2018) 21 capsule 0  . gabapentin (NEURONTIN) 300 MG capsule Take 1 capsule (300 mg total) by mouth 3 (three) times daily. 90 capsule 1  . ondansetron (ZOFRAN) 8 MG tablet Take 1 tablet (8 mg total) by mouth 2 (two) times daily as needed for refractory nausea / vomiting. Start on day 3 after chemo. (Patient not taking: Reported on 01/26/2018) 30 tablet 1  . oxyCODONE (OXY IR/ROXICODONE) 5 MG immediate release tablet Take 1-2 tablets (5-10 mg total) by mouth every 6 (six) hours as needed for moderate pain, severe pain or breakthrough pain. (Patient not taking: Reported on 01/26/2018) 20 tablet 0  . prednisoLONE acetate (PRED FORTE) 1 % ophthalmic suspension INSTILL 1 DROP INTO BOTH EYES 4 TIMES A DAY  1  . prochlorperazine (COMPAZINE) 10  MG tablet Take 1 tablet (10 mg total) by mouth every 6 (six) hours as needed (Nausea or vomiting). (Patient not taking: Reported on 08/29/2017) 30 tablet 1  . valACYclovir (VALTREX) 1000 MG tablet Take 1 tablet (1,000 mg total) by mouth daily. (Patient not taking: Reported on 01/26/2018) 7 tablet 6   No current facility-administered medications for this encounter.     Physical Findings:  weight is 239 lb 6 oz (108.6 kg). Her oral temperature is 97.9 F (36.6 C). Her blood pressure is 143/95 (abnormal) and her pulse is 78. Her respiration is 18 and oxygen saturation is 97%.  Pain Assessment Pain Score: 6  Pain Loc: Foot(feet)/ In general this is a well appearing African American female in no acute distress. She's alert and  oriented x4 and appropriate throughout the examination. Cardiopulmonary assessment is negative for acute distress and she exhibits normal effort. The left breast was examined and reveals mild hyperpigmentation of the areola nipple complex.  No evidence of desquamation is identified.  There is also hyperpigmentation along the left axilla without evidence of chest wall edema, or left upper extremity edema.  No palpable.   Lab Findings: Lab Results  Component Value Date   WBC 5.6 11/03/2017   HGB 10.9 (L) 11/03/2017   HCT 34.0 (L) 11/03/2017   MCV 93.2 11/03/2017   PLT 325 11/03/2017     Radiographic Findings: No results found.  Impression/Plan: 1.  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 The patient has been doing well since completion of radiotherapy. We discussed that we would be happy to continue to follow her as needed, but she will also continue to follow up with Dr. Burr Medico in medical oncology. She was counseled on skin care as well as measures to avoid sun exposure to this area.  2. Survivorship. We discussed the importance of survivorship evaluation and she is not currently scheduled for this, but will be in the near future. She was also given the monthly calendar for access to resources offered within the cancer center. 3. 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  Patient is counseled on the symptoms of chemotherapy-induced neuropathy as well as surgical-induced neuropathy.  We discussed the use of over-the-counter vitamin B6 as well as Neurontin.  She is interested in trying both, and a prescription was sent to her pharmacy for Neurontin.  She will follow-up with Dr. Renaee Munda regarding this and was counseled on side effect profile.     Carola Rhine, PAC

## 2018-01-29 NOTE — Progress Notes (Signed)
[place]  [phone] [phone]  Clinic Follow up Note   Patient Care Team: Girtha Rm, NP-C as PCP - General (Family Medicine) [name], MD as Consulting Physician (Hematology) [name], MD as Consulting Physician (General Surgery)   Date of Service:  02/02/2018  CHIEF COMPLAINTS:  Malignant neoplasm of lower-outer quadrant of left breast of female, triple negative    Oncology History   Cancer Staging Malignant neoplasm of lower-outer quadrant of left breast of female, estrogen receptor negative ([place]) Staging form: Breast, AJCC 8th Edition - Clinical stage from 06/06/2017: Stage IB (cT1c, cN0, cM0, G3, ER: Negative, PR: Negative, HER2: Negative) - Signed by [name], MD on 06/15/2017 - Pathologic stage from 06/26/2017: Stage IB (pT1c, pN0, cM0, G3, ER: Negative, PR: Negative, HER2: Negative) - Signed by [name], MD on 07/10/2017       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 Highly suspicious 1.4 cm mass in the slightly lower slightly outer left breast -tissue sampling recommended. 2. Indeterminate 0.5 mm mass in the slightly lower slightly outer left breast -tissue sampling recommended. 3. At least 2 left axillary lymph nodes with borderline cortical thickness.      06/06/2017 Initial Biopsy    Diagnosis 1. Breast, left, needle core biopsy, 5:30 o'clock - INVASIVE DUCTAL CARCINOMA, G3 2. Lymph node, needle/core biopsy, left axillary - NO CARCINOMA IDENTIFIED IN ONE LYMPH NODE (0/1)      06/06/2017 Initial Diagnosis    Malignant neoplasm of lower-outer quadrant of left breast of female, estrogen receptor negative ([place])      06/06/2017 Receptors her2    Estrogen Receptor: 0%, NEGATIVE Progesterone Receptor: 0%, NEGATIVE Proliferation Marker Ki67: 70%      06/26/2017 Surgery    LEFT  BREAST LUMPECTOMY WITH RADIOACTIVE SEED AND SENTINEL LYMPH NODE BIOPSY ERAS  PATHWAY AND INSERTION PORT-A-CATH By Dr. Barry Dienes on 06/26/17       06/26/2017 Pathology Results    Diagnosis 06/26/17  1. Breast, lumpectomy, Left - INVASIVE DUCTAL CARCINOMA, GRADE III/III, SPANNING 1.2 CM. - THE SURGICAL RESECTION MARGINS ARE NEGATIVE FOR CARCINOMA. - SEE ONCOLOGY TABLE BELOW. 2. Lymph node, sentinel, biopsy, Left axillary #1 - THERE IS NO EVIDENCE OF CARCINOMA IN 1 OF 1 LYMPH NODE (0/1). 3. Lymph node, sentinel, biopsy, Left axillary #2 - THERE IS NO EVIDENCE OF CARCINOMA IN 1 OF 1 LYMPH NODE (0/1). 4. Lymph node, sentinel, biopsy, Left axillary #3 - THERE IS NO EVIDENCE OF CARCINOMA IN 1 OF 1 LYMPH NODE (0/1).       07/25/2017 - 09/29/2017 Chemotherapy    Adjuvant cytoxan and docetaxel (TC) every 3 weekd for 4 cycles        11/19/2017 - 12/17/2017 Radiation Therapy    Adjuvant breast radiation Left breast treated to 42.5 Gy with 17 fx of 2.5 Gy followed by a boost of 7.5 Gy with 3 fx of 2.5 Gy         HISTORY OF PRESENTING ILLNESS: 06/18/17 Sonny Masters Brizzi 62 y.o. female is here because of newly diagnosed left breast cancer. She presents to Breast Clinic today accompanied by her daughter.   She found this mass by screening mammogram.  She denies palpable breast mass before biopsy.  She thinks her current palpable mass is a keloid post biopsy.  She denies any other new symptoms.  No change of her energy  level, weight, or appetite.  In the past she had HTN, possible COPD, and her cousin had breast cancer and her aunt had colon cancer. She quit smoking after 23 years She smoked a pack a week. She is can see moderately and has had cornea surgery. She walks with a cane. She had retinitis pigmentosa She has medication for this. She has CHF and sees a cardiologist and is on medication, Norvasc and coreg. She had lung nodules last year and she had it monitored.    GYN HISTORY  Menarchal:  11 LMP: 2015 Contraceptive: yes HRT: No G2P2: one son and one daughter    CURRENT THERAPY: Surveillance  INTERVAL HISTORY:  Mylie Mccurley is a 62 y.o. female who is here for a follow up. She presents to the clinic today accompanied by her family member. She feels well generally. Her neuropathy, mainly numbness in her feet, is moderate-severe 7/10 according to patient. She said that she is able to tolerate.She takes Vit B12 for this, which has helped her. Sitting down worsens the numbness, while lying flat improves it.Her rleft armpit is also numb.Her nails are better.  She gets sharp shooting pain and tingling sensation in her left breast that radiates to her left nipple, which started after surgery.    MEDICAL HISTORY:  Past Medical History:  Diagnosis Date  . Abnormal myocardial perfusion study 03/15/2016   EF 15%, severe LV systolic dysfcn, global hypokinesis and inferior akinesis. dilated LV  . Abnormal x-ray of lungs with single pulmonary nodule 03/15/2016   per records from [place]   . Blind   . Cholelithiasis 05/19/2017   On CT  . COPD (chronic obstructive pulmonary disease) ([place])    PT DENIES  . Coronary artery calcification seen on CAT scan 05/19/2017  . Dilated idiopathic cardiomyopathy ([place]) 12/2015   EF 15-20%. Diagnosed in [facility]  . Family history of colon cancer   . Genetic testing 06/19/2017   Multi-Cancer panel (83 genes) @ [facility] - No pathogenic mutations detected  . Headache    NONE RECENT  . Hypertension   . Malignant neoplasm of lower-outer quadrant of left breast of female, estrogen receptor negative ([place]) 06/11/2017   left breast  . Retinitis pigmentosa   . Solitary pulmonary nodule on lung CT 03/16/2016   per records from [place]  . Uveitis     SURGICAL HISTORY: Past Surgical History:  Procedure Laterality Date  . brain cyst removed  2007   to help with headaches per patient , aspirated   . BREAST LUMPECTOMY WITH  RADIOACTIVE SEED AND SENTINEL LYMPH NODE BIOPSY Left 06/26/2017   Procedure: BREAST LUMPECTOMY WITH RADIOACTIVE SEED AND SENTINEL LYMPH NODE BIOPSY ERAS  PATHWAY;  Surgeon: [name], MD;  Location: [place];  Service: General;  Laterality: Left;  pec block  . NASAL TURBINATE REDUCTION    . PORTACATH PLACEMENT N/A 06/26/2017   Procedure: INSERTION PORT-A-CATH;  Surgeon: [name], MD;  Location: [place];  Service: General;  Laterality: N/A;  . TRANSTHORACIC ECHOCARDIOGRAM  12/2015   [facility] May 2017: Mild concentric LVH. Global hypokinesis. GR daily. EF 18% severe LA dilation. Mitral annular dilatation with papillary muscle dysfunction and moderate MR. Dilated IVC consistent with elevated RAP.    SOCIAL HISTORY: Social History   Socioeconomic History  . Marital status: Widowed    Spouse name: Not on file  . Number of children: Not on file  . Years of education: Not on file  . Highest education level:  Not on file  Occupational History  . Not on file  Social Needs  . Financial resource strain: Not on file  . Food insecurity:    Worry: Not on file    Inability: Not on file  . Transportation needs:    Medical: Not on file    Non-medical: Not on file  Tobacco Use  . Smoking status: Former Smoker    Packs/day: 0.25    Years: 23.00    Pack years: 5.75    Types: Cigarettes    Last attempt to quit: 10/09/2015    Years since quitting: 2.3  . Smokeless tobacco: Never Used  Substance and Sexual Activity  . Alcohol use: No  . Drug use: No  . Sexual activity: Never  Lifestyle  . Physical activity:    Days per week: Not on file    Minutes per session: Not on file  . Stress: Not on file  Relationships  . Social connections:    Talks on phone: Not on file    Gets together: Not on file    Attends religious service: Not on file    Active member of club or organization: Not on file    Attends meetings of clubs or organizations: Not on file    Relationship status: Not  on file  . Intimate partner violence:    Fear of current or ex partner: Not on file    Emotionally abused: Not on file    Physically abused: Not on file    Forced sexual activity: Not on file  Other Topics Concern  . Not on file  Social History Narrative   She is a widowed mother of 2 (daughter and son).   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   She moved to [place] apparently back in 2016, but was visiting family in [place] and was diagnosed with Cardiomyopathy.   She is accompanied by her daughter. She walks roughly 1-2 miles a day.    FAMILY HISTORY: Family History  Problem Relation Age of Onset  . Colon cancer Mother 41       deceased 84  . Heart attack Father   . Breast cancer Other 56       2nd cousin on maternal side; currently 65  . Pancreatic cancer Other        2nd cousin once removed on maternal side; deceased 35s    ALLERGIES:  is allergic to latex; tylenol with codeine #3 [acetaminophen-codeine]; and penicillins.  MEDICATIONS:  Current Outpatient Medications  Medication Sig Dispense Refill  . aspirin 81 MG chewable tablet Chew 81 mg by mouth daily.    . carvedilol (COREG) 12.5 MG tablet TAKE 1 TABLET (12.5 MG TOTAL) BY MOUTH 2 (TWO) TIMES DAILY WITH A MEAL. 60 tablet 4  . furosemide (LASIX) 40 MG tablet TAKE 1 TABLET BY MOUTH EVERY DAY 30 tablet 4  . gabapentin (NEURONTIN) 300 MG capsule Take 1 capsule (300 mg total) by mouth 3 (three) times daily. 90 capsule 1  . lidocaine-prilocaine (EMLA) cream Apply to affected area once 30 g 3  . naproxen (NAPROSYN) 500 MG tablet Take 1 tablet (500 mg total) by mouth daily as needed for mild pain. 20 tablet 1  . prednisoLONE acetate (PRED FORTE) 1 % ophthalmic suspension INSTILL 1 DROP INTO BOTH EYES 4 TIMES A DAY  1  . sacubitril-valsartan (ENTRESTO) 24-26 MG Take 1 tablet by mouth 2 (two) times daily. 60 tablet 5  . spironolactone (ALDACTONE) 25 MG  tablet Take 1 tablet (25 mg total) by mouth daily. 90 tablet 1    . vitamin C (ASCORBIC ACID) 500 MG tablet Take 1,000 mg 2 (two) times daily by mouth.     Current Facility-Administered Medications  Medication Dose Route Frequency Provider Last Rate Last Dose  . heparin lock flush 100 unit/mL  500 Units Intravenous Once [name], MD      . sodium chloride flush (NS) 0.9 % injection 10 mL  10 mL Intravenous PRN [name], MD         REVIEW OF SYSTEMS: Constitutional: Denies fevers, chills or abnormal night sweats Eyes: Denies blurriness of vision, double vision or watery eyes Ears, nose, mouth, throat, and face: Denies mucositis or sore throat  Respiratory: Denies cough, dyspnea or wheezes Cardiovascular: Denies palpitation, chest discomfort or lower extremity swelling Gastrointestinal:  Denies nausea, heartburn, diarrhea Skin: Denies abnormal skin rashes Lymphatics: Denies new lymphadenopathy or easy bruising Neurological: Denies tingling or new weaknesses (+) numbness in left axilla and bilateral LLs. Behavioral/Psych: Mood is stable, no new changes  All other systems were reviewed with the patient and are negative Breast: (+) limited ROM of left arm, right chest soreness from port, mild swelling to left breast and shooting pain and tingling that radiates to her nipple after surgery  PHYSICAL EXAMINATION: ECOG PERFORMANCE STATUS: 1 - Symptomatic but completely ambulatory  Vitals:   02/02/18 0927  BP: (!) 146/90  Pulse: 67  Resp: 18  Temp: 98.8 F (37.1 C)  SpO2: 98%   Filed Weights   02/02/18 0927  Weight: 239 lb 12.8 oz (108.8 kg)    GENERAL:alert, no distress and comfortable SKIN: skin color, texture, turgor are normal, no rashes or significant lesions EYES: normal, conjunctiva are pink and non-injected, sclera clear OROPHARYNX:no exudate, no erythema and lips, buccal mucosa, and tongue normal  NECK: supple, thyroid normal size, non-tender, without nodularity LYMPH:  no palpable lymphadenopathy in the cervical, axillary or  inguinal LUNGS: clear to auscultation and percussion with normal breathing effort HEART: regular rate & rhythm and no murmurs and no lower extremity edema ABDOMEN:abdomen soft, non-tender and normal bowel sounds Musculoskeletal:no cyanosis of digits and no clubbing  PSYCH: alert & oriented x 3 with fluent speech NEURO: no focal motor/sensory deficits Breast: (+) s/p left lumpectomy: surgical incision in left breast and axilla healed well, no seroma or palpable mass. Left breast with mild edema without skin erythema , mild tendernes at surgical incision site, no significant scar. Right breast exam benign.  She has moderate tenderness in the left axilla, no palpable mass or adenopathy.  LABORATORY DATA:  I have reviewed the data as listed CBC Latest Ref Rng & Units 02/02/2018 11/03/2017 10/06/2017  WBC 3.9 - 10.3 K/uL 5.3 5.6 9.7  Hemoglobin 11.6 - 15.9 g/dL 12.2 10.9(L) 9.9(L)  Hematocrit 34.8 - 46.6 % 37.2 34.0(L) 30.0(L)  Platelets 145 - 400 K/uL 287 325 310    CMP Latest Ref Rng & Units 11/03/2017 10/06/2017 09/29/2017  Glucose 70 - 140 mg/dL 102 156(H) 89  BUN 7 - 26 mg/dL '11 13 7  ' Creatinine 0.60 - 1.10 mg/dL 0.80 0.77 0.79  Sodium 136 - 145 mmol/L 140 143 141  Potassium 3.5 - 5.1 mmol/L 4.0 3.4(L) 3.8  Chloride 98 - 109 mmol/L 110(H) 111(H) 110(H)  CO2 22 - 29 mmol/L '23 25 23  ' Calcium 8.4 - 10.4 mg/dL 9.3 9.1 9.5  Total Protein 6.4 - 8.3 g/dL 7.4 6.6 7.4  Total Bilirubin 0.2 -  1.2 mg/dL 0.5 0.4 0.5  Alkaline Phos 40 - 150 U/L 120 136 112  AST 5 - 34 U/L '21 21 21  ' ALT 0 - 55 U/L '14 23 11     ' PATHOLOGY  Diagnosis 06/26/17  1. Breast, lumpectomy, Left - INVASIVE DUCTAL CARCINOMA, GRADE III/III, SPANNING 1.2 CM. - THE SURGICAL RESECTION MARGINS ARE NEGATIVE FOR CARCINOMA. - SEE ONCOLOGY TABLE BELOW. 2. Lymph node, sentinel, biopsy, Left axillary #1 - THERE IS NO EVIDENCE OF CARCINOMA IN 1 OF 1 LYMPH NODE (0/1). 3. Lymph node, sentinel, biopsy, Left axillary #2 - THERE IS NO  EVIDENCE OF CARCINOMA IN 1 OF 1 LYMPH NODE (0/1). 4. Lymph node, sentinel, biopsy, Left axillary #3 - THERE IS NO EVIDENCE OF CARCINOMA IN 1 OF 1 LYMPH NODE (0/1). Microscopic Comment 1. BREAST, INVASIVE TUMOR Procedure: Seed localized lumpectomy. Laterality: Left. Tumor Size: 1.2 cm (gross measurement). Histologic Type: Ductal. Grade: III. Tubular Differentiation: 3. Nuclear Pleomorphism: 2. Mitotic Count: 3. Ductal Carcinoma in Situ (DCIS): Not identified. Extent of Tumor: Confined to breast parenchyma. Margins: Greater than 0.2 cm to all margins. Regional Lymph Nodes: Number of Lymph Nodes Examined: 3. Number of Sentinel Lymph Nodes Examined: 3. Lymph Nodes with Macrometastases: 0. Lymph Nodes with Micrometastases: 0. Lymph Nodes with Isolated Tumor Cells: 0. Breast Prognostic Profile: Case [id number]. Estrogen Receptor: 0%. 1 of 3 FINAL for Reede, Ambrosia ANN [phone]) Microscopic Comment(continued) Progesterone Receptor: 0%. Her2: No amplification was detected. Ki-67: 70%. Best tumor block for sendout testing: 1A or 1B. Pathologic Stage Classification (pTNM, AJCC 8th Edition): Primary Tumor (pT): pT1c. Regional Lymph Nodes (pN): pN0. Distant Metastases (pM): pMX.   Diagnosis 1. Breast, left, needle core biopsy, 5:30 o'clock - INVASIVE DUCTAL CARCINOMA - SEE COMMENT 2. Lymph node, needle/core biopsy, left axillary - NO CARCINOMA IDENTIFIED IN ONE LYMPH NODE (0/1) - SEE COMMENT Microscopic Comment 1. Based on the biopsy, the carcinoma appears Nottingham grade 3 of 3 and measures 1.2 cm in greatest linear extent. Prognostic markers (ER/PR/ki-67/HER2-FISH) are pending and will be reported in an addendum. Dr. Lyndon Code has reviewed the case and agrees with above diagnosis. These results were called to [facility] on June 10, 2017. 2. Deeper levels were examined. Cytokeratin AE1/3 is negative supporting the above diagnosis. 1. PROGNOSTIC  INDICATORS Results: IMMUNOHISTOCHEMICAL AND MORPHOMETRIC ANALYSIS PERFORMED MANUALLY Estrogen Receptor: 0%, NEGATIVE Progesterone Receptor: 0%, NEGATIVE Proliferation Marker Ki67: 70%   RADIOGRAPHIC STUDIES: I have personally reviewed the radiological images as listed and agreed with the findings in the report. No results found.  ASSESSMENT & PLAN:  Kristin Ward is a 62 y.o. female with a history of HTN, COPD, retinitis pigmentosa, and CHF with EF 45-50%, presented with screening discovered left breast cancer.   1. 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  I reviewed her surgical pathology findings in great detail, surgical margins were negative.  3 sentinel lymph nodes were negative. -We previously reviewed the risk of cancer recurrence after complete surgical resection, which is based on the stage and biology of cancer. She has early stage but very aggressive disease.  She has high risk for recurrence due to triple negative disease  -Due to the high risk disease, I recommended adjuvant chemotherapy. We discussed adjuvant chemotherapy regimens, due to her cardiomyopathy, she is not a candidate for Adriamycin.  I suggest moderate intense chemo regiment docetaxel  and Cytoxan every 2 weeks, for 4 cycles, due to her comorbidity.   -She completed adjuvant chemotherapy, tolerated moderately well, had an episode of syncope after chemo, and required dose reduction for last two cycles. -She has now recovered well from chemotherapy, except mild neuropathy. -She now has completed adjuvant radiation, tolerating well overall. -She is clinically doing well, exam was unremarkable, lab reviewed, no clinical concern for recurrence -Continue breast cancer surveillance I encouraged her to see Dr. Barry Dienes in the 3 months, I will see her back in 6 months,   - Her port will be flushed in 6 weeks and plan to remove in 3 months -Continue annual diagnostic mammogram, she will be due in October  2. Genetics -Due to her age and triple negative breast cancer, and positive family history, she will be referred to genetics -Results were negative for any gene mutations.   3.  Chronic combined systolic and diastolic heart failure, EF 45-50% -Follow-up with her cardiologist Dr. Ellyn Hack  -repeat ECHO on 05/14/17 shows improved heart function  -She is on lasix and spironolactone, I may hold if she becomes dehydrated. She can continue aspirin.  -Will repeat ECHO after chemotherapy -I previously suggested if she drinks less she should cut back on her lasix to prevent dehydration. She should check her BP at home regularly. If her BP is low and she feels dizzy she should contact our clinic and hold lasix.   4. HTN, COPD, retinitis pigmentosa with limited vision -Follow-up with primary care physician -She is legally blind -She had worsening vision during chemo, recovered to her baseline now. -She can start Lasix and amlodipine as of 11/03/17 -I advised her to check her BP at home and follow up with her cardiologist.   5.Left breast Lymphadema  -She has some mild left breast edema and tenderness -She also has moderate left axilla tenderness, no palpable mass or adenopathy, likely related to her previous surgery -GU monitoring  PLAN: -Port flush in 6 weeks -Lab and f/u in 6 months   -She will see Dr. Barry Dienes in 3 months and have her port removed at that time -Mammogram in October 2019, ordered today   No orders of the defined types were placed in this encounter.   All questions were answered. The patient knows to call the clinic with any problems, questions or concerns.  I spent 20 minutes counseling the patient face to face. The total time spent in the appointment was 25 minutes and more than 50% was on counseling.   Dierdre Searles Dweik am acting as scribe  for Dr. [name].  I have reviewed the above documentation for accuracy and completeness, and I agree with the above.     [name], MD 02/02/2018 9:56 AM

## 2018-02-02 ENCOUNTER — Inpatient Hospital Stay: Payer: Medicare HMO | Attending: Hematology

## 2018-02-02 ENCOUNTER — Inpatient Hospital Stay (HOSPITAL_BASED_OUTPATIENT_CLINIC_OR_DEPARTMENT_OTHER): Payer: Medicare HMO | Admitting: Hematology

## 2018-02-02 ENCOUNTER — Encounter: Payer: Self-pay | Admitting: Hematology

## 2018-02-02 VITALS — BP 146/90 | HR 67 | Temp 98.8°F | Resp 18 | Ht 67.0 in | Wt 239.8 lb

## 2018-02-02 DIAGNOSIS — Z853 Personal history of malignant neoplasm of breast: Secondary | ICD-10-CM | POA: Insufficient documentation

## 2018-02-02 DIAGNOSIS — J449 Chronic obstructive pulmonary disease, unspecified: Secondary | ICD-10-CM | POA: Diagnosis not present

## 2018-02-02 DIAGNOSIS — Z171 Estrogen receptor negative status [ER-]: Secondary | ICD-10-CM

## 2018-02-02 DIAGNOSIS — C50512 Malignant neoplasm of lower-outer quadrant of left female breast: Secondary | ICD-10-CM

## 2018-02-02 DIAGNOSIS — I110 Hypertensive heart disease with heart failure: Secondary | ICD-10-CM | POA: Diagnosis not present

## 2018-02-02 DIAGNOSIS — I5042 Chronic combined systolic (congestive) and diastolic (congestive) heart failure: Secondary | ICD-10-CM

## 2018-02-02 DIAGNOSIS — Z170 Estrogen receptor positive status [ER+]: Secondary | ICD-10-CM

## 2018-02-02 LAB — COMPREHENSIVE METABOLIC PANEL
ALK PHOS: 110 U/L (ref 40–150)
ALT: 14 U/L (ref 0–55)
AST: 19 U/L (ref 5–34)
Albumin: 4.3 g/dL (ref 3.5–5.0)
Anion gap: 11 (ref 3–11)
BUN: 18 mg/dL (ref 7–26)
CALCIUM: 9.9 mg/dL (ref 8.4–10.4)
CO2: 23 mmol/L (ref 22–29)
CREATININE: 0.85 mg/dL (ref 0.60–1.10)
Chloride: 109 mmol/L (ref 98–109)
GFR calc Af Amer: >60 mL/min (ref >60)
GLUCOSE: 104 mg/dL (ref 70–140)
Potassium: 4.1 mmol/L (ref 3.5–5.1)
SODIUM: 143 mmol/L (ref 136–145)
TOTAL PROTEIN: 7.8 g/dL (ref 6.4–8.3)
Total Bilirubin: 0.4 mg/dL (ref 0.2–1.2)

## 2018-02-02 LAB — CBC WITH DIFFERENTIAL/PLATELET
BASOS ABS: 0 10*3/uL (ref 0.0–0.1)
BASOS PCT: 0 %
Eosinophils Absolute: 0.2 10*3/uL (ref 0.0–0.5)
Eosinophils Relative: 4 %
HCT: 37.2 % (ref 34.8–46.6)
Hemoglobin: 12.2 g/dL (ref 11.6–15.9)
LYMPHS PCT: 27 %
Lymphs Abs: 1.4 10*3/uL (ref 0.9–3.3)
MCH: 28.4 pg (ref 25.1–34.0)
MCHC: 32.8 g/dL (ref 31.5–36.0)
MCV: 86.7 fL (ref 79.5–101.0)
MONO ABS: 0.5 10*3/uL (ref 0.1–0.9)
Monocytes Relative: 10 %
Neutro Abs: 3.1 10*3/uL (ref 1.5–6.5)
Neutrophils Relative %: 59 %
Platelets: 287 10*3/uL (ref 145–400)
RBC: 4.29 MIL/uL (ref 3.70–5.45)
RDW: 14.4 % (ref 11.2–14.5)
WBC: 5.3 10*3/uL (ref 3.9–10.3)

## 2018-02-02 MED ORDER — SODIUM CHLORIDE 0.9% FLUSH
10.0000 mL | INTRAVENOUS | Status: DC | PRN
  Administered 2018-02-02: 10 mL via INTRAVENOUS
  Filled 2018-02-02: qty 10

## 2018-02-02 MED ORDER — HEPARIN SOD (PORK) LOCK FLUSH 100 UNIT/ML IV SOLN
500.0000 [IU] | Freq: Once | INTRAVENOUS | Status: AC
  Administered 2018-02-02: 500 [IU] via INTRAVENOUS
  Filled 2018-02-02: qty 5

## 2018-02-03 ENCOUNTER — Telehealth: Payer: Self-pay | Admitting: Hematology

## 2018-02-03 NOTE — Telephone Encounter (Signed)
Appointments scheduled letter/calendar mailed to patient spoke with patient also per 6/24 los

## 2018-03-11 ENCOUNTER — Ambulatory Visit: Payer: Medicare HMO | Admitting: Family Medicine

## 2018-03-12 ENCOUNTER — Encounter: Payer: Self-pay | Admitting: Family Medicine

## 2018-03-12 ENCOUNTER — Ambulatory Visit (INDEPENDENT_AMBULATORY_CARE_PROVIDER_SITE_OTHER): Payer: Medicare HMO | Admitting: Family Medicine

## 2018-03-12 ENCOUNTER — Telehealth: Payer: Self-pay | Admitting: Family Medicine

## 2018-03-12 ENCOUNTER — Telehealth: Payer: Self-pay | Admitting: Internal Medicine

## 2018-03-12 VITALS — BP 124/80 | HR 63 | Temp 98.2°F | Wt 240.6 lb

## 2018-03-12 DIAGNOSIS — B977 Papillomavirus as the cause of diseases classified elsewhere: Secondary | ICD-10-CM | POA: Diagnosis not present

## 2018-03-12 DIAGNOSIS — Z1211 Encounter for screening for malignant neoplasm of colon: Secondary | ICD-10-CM | POA: Diagnosis not present

## 2018-03-12 DIAGNOSIS — Z800 Family history of malignant neoplasm of digestive organs: Secondary | ICD-10-CM

## 2018-03-12 DIAGNOSIS — I10 Essential (primary) hypertension: Secondary | ICD-10-CM | POA: Diagnosis not present

## 2018-03-12 DIAGNOSIS — R202 Paresthesia of skin: Secondary | ICD-10-CM

## 2018-03-12 DIAGNOSIS — I5042 Chronic combined systolic (congestive) and diastolic (congestive) heart failure: Secondary | ICD-10-CM

## 2018-03-12 DIAGNOSIS — H3552 Pigmentary retinal dystrophy: Secondary | ICD-10-CM | POA: Diagnosis not present

## 2018-03-12 DIAGNOSIS — Z789 Other specified health status: Secondary | ICD-10-CM

## 2018-03-12 DIAGNOSIS — I420 Dilated cardiomyopathy: Secondary | ICD-10-CM | POA: Diagnosis not present

## 2018-03-12 DIAGNOSIS — R87612 Low grade squamous intraepithelial lesion on cytologic smear of cervix (LGSIL): Secondary | ICD-10-CM

## 2018-03-12 NOTE — Telephone Encounter (Signed)
Called pt's insurance [employer] and no referrals are required. Reference # [id number]   Tried to call pt to advise but could not reach her and could not leave a VM

## 2018-03-12 NOTE — Progress Notes (Signed)
   Subjective:    Patient ID: Kristin Ward, female    DOB: 03-28-1956, 62 y.o.   MRN: [id number]  HPI Chief Complaint  Patient presents with  . numbness    numbness in feet and was just prescribed gabapentin. would like form filled out to show that she is disability so she won't have to pay student loans back   She is here with concerns about paresthesias in her feet.  Recently told this appears to be related to chemotherapy.  Taking vitamin B6 and gabapentin for this. States this seems to be helping.   No new concerns today. Denies fever, chills, chest pain, palpitations, shortness of breath, abdominal pain, nausea, vomiting.   States she needs referrals to all of her specialists since her insurance changed.   Dr. Hulan Fray is OB/GYN- states she history of abnormal pap smear and was told she should have a LEEP.  Dr. Silverio Decamp - GI. Needs colonoscopy Dr. Ellyn Hack- cardiologist who is managing her CHF.  Latest EF improved.   States she is feeling much better than before her breast cancer diagnosis. She is walking daily and eating healthier.    Dr. Katy Fitch- is her ophthalmologist Dr. Zadie Rhine retina specialist  She has paperwork with her that she would like to have filled out.  States her eye disease is the cause for her disability and if she gets this confirmed by a physician then she can get her student loans waived.  Would like to remove DNR become a full code    Review of Systems Pertinent positives and negatives in the history of present illness.     Objective:   Physical Exam BP 124/80   Pulse 63   Temp 98.2 F (36.8 C) (Oral)   Wt 240 lb 9.6 oz (109.1 kg)   SpO2 98%   BMI 37.68 kg/m   Alert oriented and in no acute distress.  Skin is warm and dry, no pallor or rash.  Extremities without edema, intact distal pulses.     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  She will continue on these and follow-up with her oncologist who initiated treatment. Recommend that she see her retina specialist or ophthalmologist in order to fill out forms for disability and student loan waiver. Referral made to her specialists per patient request due to recent insurance change requiring referrals from her PCP. Overall she appears to be doing quite well  No longer wishes to be DNR code.  This will be changed in her EMR

## 2018-03-12 NOTE — Telephone Encounter (Signed)
Patient came in for visit today and requested to remove the DNR and wants full code status. Request was made in writing and I have contacted IT to correct patients chart.

## 2018-03-16 ENCOUNTER — Inpatient Hospital Stay: Payer: Medicare HMO

## 2018-03-16 ENCOUNTER — Inpatient Hospital Stay: Payer: Medicare HMO | Attending: Hematology

## 2018-03-16 DIAGNOSIS — Z853 Personal history of malignant neoplasm of breast: Secondary | ICD-10-CM | POA: Insufficient documentation

## 2018-03-16 DIAGNOSIS — Z171 Estrogen receptor negative status [ER-]: Principal | ICD-10-CM

## 2018-03-16 DIAGNOSIS — C50512 Malignant neoplasm of lower-outer quadrant of left female breast: Secondary | ICD-10-CM

## 2018-03-16 DIAGNOSIS — Z95828 Presence of other vascular implants and grafts: Secondary | ICD-10-CM

## 2018-03-16 DIAGNOSIS — Z452 Encounter for adjustment and management of vascular access device: Secondary | ICD-10-CM | POA: Diagnosis not present

## 2018-03-16 LAB — CBC WITH DIFFERENTIAL/PLATELET
Basophils Absolute: 0 10*3/uL (ref 0.0–0.1)
Basophils Relative: 0 %
Eosinophils Absolute: 0.2 10*3/uL (ref 0.0–0.5)
Eosinophils Relative: 4 %
HEMATOCRIT: 35 % (ref 34.8–46.6)
HEMOGLOBIN: 11.7 g/dL (ref 11.6–15.9)
LYMPHS ABS: 1.4 10*3/uL (ref 0.9–3.3)
Lymphocytes Relative: 29 %
MCH: 29.6 pg (ref 25.1–34.0)
MCHC: 33.4 g/dL (ref 31.5–36.0)
MCV: 88.6 fL (ref 79.5–101.0)
MONO ABS: 0.4 10*3/uL (ref 0.1–0.9)
Monocytes Relative: 7 %
NEUTROS ABS: 2.9 10*3/uL (ref 1.5–6.5)
NEUTROS PCT: 60 %
Platelets: 277 10*3/uL (ref 145–400)
RBC: 3.95 MIL/uL (ref 3.70–5.45)
RDW: 14.4 % (ref 11.2–14.5)
WBC: 4.8 10*3/uL (ref 3.9–10.3)

## 2018-03-16 LAB — COMPREHENSIVE METABOLIC PANEL
ALBUMIN: 4.1 g/dL (ref 3.5–5.0)
ALT: 16 U/L (ref 0–44)
AST: 20 U/L (ref 15–41)
Alkaline Phosphatase: 112 U/L (ref 38–126)
Anion gap: 10 (ref 5–15)
BILIRUBIN TOTAL: 0.6 mg/dL (ref 0.3–1.2)
BUN: 16 mg/dL (ref 6–20)
CO2: 24 mmol/L (ref 22–32)
Calcium: 9.6 mg/dL (ref 8.9–10.3)
Chloride: 108 mmol/L (ref 98–111)
Creatinine, Ser: 0.91 mg/dL (ref 0.44–1.00)
GFR calc Af Amer: >60 mL/min (ref >60)
GFR calc non Af Amer: >60 mL/min (ref >60)
GLUCOSE: 122 mg/dL — AB (ref 70–99)
POTASSIUM: 3.7 mmol/L (ref 3.5–5.1)
Sodium: 142 mmol/L (ref 135–145)
TOTAL PROTEIN: 7.8 g/dL (ref 6.5–8.1)

## 2018-03-16 MED ORDER — HEPARIN SOD (PORK) LOCK FLUSH 100 UNIT/ML IV SOLN
500.0000 [IU] | Freq: Once | INTRAVENOUS | Status: AC | PRN
  Administered 2018-03-16: 500 [IU] via INTRAVENOUS
  Filled 2018-03-16: qty 5

## 2018-03-16 MED ORDER — SODIUM CHLORIDE 0.9% FLUSH
10.0000 mL | INTRAVENOUS | Status: DC | PRN
  Administered 2018-03-16: 10 mL via INTRAVENOUS
  Filled 2018-03-16: qty 10

## 2018-03-16 NOTE — Patient Instructions (Signed)

## 2018-03-19 ENCOUNTER — Encounter: Payer: Self-pay | Admitting: Internal Medicine

## 2018-04-15 ENCOUNTER — Telehealth: Payer: Self-pay | Admitting: Gastroenterology

## 2018-04-15 NOTE — Telephone Encounter (Signed)
Dr Silverio Decamp this patient was to be a screening colonoscopy last year. She did not have it done. She has since had breast cancer and is undergoing radiation. Finished with chemotherapy. Is it okay to schedule her screening colonoscopy at the hospital next available opening as a direct?

## 2018-04-15 NOTE — Telephone Encounter (Signed)
Please check if ok to schedule at [facility] for screening colonoscopy? Thanks

## 2018-04-16 NOTE — Telephone Encounter (Signed)
Contacted and scheduled 

## 2018-04-24 ENCOUNTER — Other Ambulatory Visit (HOSPITAL_COMMUNITY)
Admission: RE | Admit: 2018-04-24 | Discharge: 2018-04-24 | Disposition: A | Discharge status: Home / Self Care | Payer: Medicare HMO | Source: Ambulatory Visit | Attending: Obstetrics & Gynecology | Admitting: Obstetrics & Gynecology

## 2018-04-24 ENCOUNTER — Encounter: Payer: Self-pay | Admitting: Obstetrics & Gynecology

## 2018-04-24 ENCOUNTER — Ambulatory Visit (INDEPENDENT_AMBULATORY_CARE_PROVIDER_SITE_OTHER): Payer: Medicare HMO | Admitting: Obstetrics & Gynecology

## 2018-04-24 VITALS — BP 128/91 | HR 71 | Wt 235.0 lb

## 2018-04-24 DIAGNOSIS — R87612 Low grade squamous intraepithelial lesion on cytologic smear of cervix (LGSIL): Secondary | ICD-10-CM

## 2018-04-24 DIAGNOSIS — N888 Other specified noninflammatory disorders of cervix uteri: Secondary | ICD-10-CM | POA: Diagnosis not present

## 2018-04-24 NOTE — Addendum Note (Signed)
Addended by: Emily Filbert on: 04/24/2018 09:22 AM   Modules accepted: Orders

## 2018-04-24 NOTE — Progress Notes (Signed)
   Subjective:    Patient ID: Kristin Ward, female    DOB: 1955/09/12, 62 y.o.   MRN: [id number]  HPI 62 yo P3 is here for a colpo due to a LGSIL pap 11/18.   Review of Systems LMP 2015 She was treated for breast cancer last year.    Objective:   Physical Exam Breathing, conversing, and ambulating normally Well nourished, well hydrated Black female, no apparent distress UPT negative, consent signed, time out done Cervix prepped with acetic acid. Entirely normal atrophic cervix. ECC obtained. She tolerated the procedure well.    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

## 2018-04-29 ENCOUNTER — Encounter: Payer: Self-pay | Admitting: *Deleted

## 2018-05-07 ENCOUNTER — Other Ambulatory Visit: Payer: Self-pay | Admitting: Cardiology

## 2018-05-08 ENCOUNTER — Other Ambulatory Visit: Payer: Self-pay | Admitting: Cardiology

## 2018-05-26 ENCOUNTER — Encounter: Payer: Self-pay | Admitting: Gastroenterology

## 2018-05-26 ENCOUNTER — Ambulatory Visit (AMBULATORY_SURGERY_CENTER): Payer: Self-pay

## 2018-05-26 ENCOUNTER — Ambulatory Visit: Payer: Medicare HMO | Admitting: Cardiology

## 2018-05-26 VITALS — Ht 67.0 in | Wt 246.6 lb

## 2018-05-26 DIAGNOSIS — Z800 Family history of malignant neoplasm of digestive organs: Secondary | ICD-10-CM

## 2018-05-26 MED ORDER — NA SULFATE-K SULFATE-MG SULF 17.5-3.13-1.6 GM/177ML PO SOLN: 1.0000 | Freq: Once | ORAL | 0 refills | Status: AC

## 2018-05-26 NOTE — Progress Notes (Signed)
Denies allergies to eggs or soy products. Denies complication of anesthesia or sedation. Denies use of weight loss medication. Denies use of O2.   Emmi instructions declined.   Patient already had a Suprep kit. Patient states that she and her daughter will be coming to this appointment on a [place] bus. I instructed the patient that they will need to be called or willing to come to pick them up in a timely manner. The patients daughter said that she understood and they will come back when they need too.

## 2018-05-27 ENCOUNTER — Encounter: Payer: Self-pay | Admitting: Adult Health

## 2018-05-27 ENCOUNTER — Ambulatory Visit (INDEPENDENT_AMBULATORY_CARE_PROVIDER_SITE_OTHER): Payer: Medicare HMO | Admitting: Adult Health

## 2018-05-27 VITALS — BP 156/82 | HR 67 | Ht 67.0 in | Wt 248.0 lb

## 2018-05-27 DIAGNOSIS — Z79899 Other long term (current) drug therapy: Secondary | ICD-10-CM

## 2018-05-27 DIAGNOSIS — I10 Essential (primary) hypertension: Secondary | ICD-10-CM | POA: Diagnosis not present

## 2018-05-27 DIAGNOSIS — N39498 Other specified urinary incontinence: Secondary | ICD-10-CM

## 2018-05-27 DIAGNOSIS — I5042 Chronic combined systolic (congestive) and diastolic (congestive) heart failure: Secondary | ICD-10-CM

## 2018-05-27 MED ORDER — SPIRONOLACTONE 25 MG PO TABS: 25.0000 mg | ORAL_TABLET | Freq: Every day | ORAL | 1 refills | Status: DC

## 2018-05-27 MED ORDER — CARVEDILOL 12.5 MG PO TABS: 12.5000 mg | ORAL_TABLET | Freq: Two times a day (BID) | ORAL | 1 refills | Status: DC

## 2018-05-27 MED ORDER — FUROSEMIDE 40 MG PO TABS: 40.0000 mg | ORAL_TABLET | Freq: Every day | ORAL | 1 refills | Status: DC

## 2018-05-27 MED ORDER — SACUBITRIL-VALSARTAN 24-26 MG PO TABS: 1.0000 | ORAL_TABLET | Freq: Two times a day (BID) | ORAL | 5 refills | Status: DC

## 2018-05-27 NOTE — Patient Instructions (Signed)
Medication Instructions:  TAKE LASIX 40MG  WHEN YOU GET HOME THEN Thursday 40 MG TWICE DAILY THEN Friday 40MG  DAILY (BACK TO NORMAL)  If you need a refill on your cardiac medications before your next appointment, please call your pharmacy.  Labwork: BMET AND BNP TODAY HERE IN OUR OFFICE AT [facility] REPEAT IN ONE WEEK(ABOUT 07-04-2018) BMET AND BNP.  Take the provided lab slips with you to the lab for your blood draw.   You will NOT need to fast   If you have labs (blood work) drawn today and your tests are completely normal, you will receive your results only by: Marland Kitchen MyChart Message (if you have MyChart) OR . A paper copy in the mail If you have any lab test that is abnormal or we need to change your treatment, we will call you to review the results.  Special Instructions: TAKE LASIX ON EMPTY STOMACH  PLEASE FOLLOW LOW SODIUM DIET  Follow-Up: You will need a follow up appointment in 2 WEEKS.  You may see  DR Ellyn Hack or Jory Sims, DNP, ANP. At [facility], you and your health needs are our priority.  As part of our continuing mission to provide you with exceptional heart care, we have created designated Provider Care Teams.  These Care Teams include your primary Cardiologist (physician) and Advanced Practice Providers (APPs -  Physician Assistants and Nurse Practitioners) who all work together to provide you with the care you need, when you need it.  Thank you for choosing [facility] at [employer]!!      LOW SODIUM DIET (DASH Eating Plan) DASH stands for "Dietary Approaches to Stop Hypertension." The DASH eating plan is a healthy eating plan that has been shown to reduce high blood pressure (hypertension). It may also reduce your risk for type 2 diabetes, heart disease, and stroke. The DASH eating plan may also help with weight loss. What are tips for following this plan? General guidelines  Avoid eating more than 2,300 mg (milligrams) of salt (sodium) a day. If you have  hypertension, you may need to reduce your sodium intake to 1,500 mg a day.  Limit alcohol intake to no more than 1 drink a day for nonpregnant women and 2 drinks a day for men. One drink equals 12 oz of beer, 5 oz of wine, or 1 oz of hard liquor.  Work with your health care provider to maintain a healthy body weight or to lose weight. Ask what an ideal weight is for you.  Get at least 30 minutes of exercise that causes your heart to beat faster (aerobic exercise) most days of the week. Activities may include walking, swimming, or biking.  Work with your health care provider or diet and nutrition specialist (dietitian) to adjust your eating plan to your individual calorie needs. Reading food labels  Check food labels for the amount of sodium per serving. Choose foods with less than 5 percent of the Daily Value of sodium. Generally, foods with less than 300 mg of sodium per serving fit into this eating plan.  To find whole grains, look for the word "whole" as the first word in the ingredient list. Shopping  Buy products labeled as "low-sodium" or "no salt added."  Buy fresh foods. Avoid canned foods and premade or frozen meals. Cooking  Avoid adding salt when cooking. Use salt-free seasonings or herbs instead of table salt or sea salt. Check with your health care provider or pharmacist before using salt substitutes.  Do not fry foods.  Cook foods using healthy methods such as baking, boiling, grilling, and broiling instead.  Cook with heart-healthy oils, such as olive, canola, soybean, or sunflower oil. Meal planning   Eat a balanced diet that includes: ? 5 or more servings of fruits and vegetables each day. At each meal, try to fill half of your plate with fruits and vegetables. ? Up to 6-8 servings of whole grains each day. ? Less than 6 oz of lean meat, poultry, or fish each day. A 3-oz serving of meat is about the same size as a deck of cards. One egg equals 1 oz. ? 2 servings of  low-fat dairy each day. ? A serving of nuts, seeds, or beans 5 times each week. ? Heart-healthy fats. Healthy fats called Omega-3 fatty acids are found in foods such as flaxseeds and coldwater fish, like sardines, salmon, and mackerel.  Limit how much you eat of the following: ? Canned or prepackaged foods. ? Food that is high in trans fat, such as fried foods. ? Food that is high in saturated fat, such as fatty meat. ? Sweets, desserts, sugary drinks, and other foods with added sugar. ? Full-fat dairy products.  Do not salt foods before eating.  Try to eat at least 2 vegetarian meals each week.  Eat more home-cooked food and less restaurant, buffet, and fast food.  When eating at a restaurant, ask that your food be prepared with less salt or no salt, if possible. What foods are recommended? The items listed may not be a complete list. Talk with your dietitian about what dietary choices are best for you. Grains Whole-grain or whole-wheat bread. Whole-grain or whole-wheat pasta. Brown rice. [place]. Bulgur. Whole-grain and low-sodium cereals. Pita bread. Low-fat, low-sodium crackers. Whole-wheat flour tortillas. Vegetables Fresh or frozen vegetables (raw, steamed, roasted, or grilled). Low-sodium or reduced-sodium tomato and vegetable juice. Low-sodium or reduced-sodium tomato sauce and tomato paste. Low-sodium or reduced-sodium canned vegetables. Fruits All fresh, dried, or frozen fruit. Canned fruit in natural juice (without added sugar). Meat and other protein foods Skinless chicken or [place]. Ground chicken or [place]. Pork with fat trimmed off. Fish and seafood. Egg whites. Dried beans, peas, or lentils. Unsalted nuts, nut butters, and seeds. Unsalted canned beans. Lean cuts of beef with fat trimmed off. Low-sodium, lean deli meat. Dairy Low-fat (1%) or fat-free (skim) milk. Fat-free, low-fat, or reduced-fat cheeses. Nonfat, low-sodium ricotta or cottage cheese. Low-fat or  nonfat yogurt. Low-fat, low-sodium cheese. Fats and oils Soft margarine without trans fats. Vegetable oil. Low-fat, reduced-fat, or light mayonnaise and salad dressings (reduced-sodium). Canola, safflower, olive, soybean, and sunflower oils. Avocado. Seasoning and other foods Herbs. Spices. Seasoning mixes without salt. Unsalted popcorn and pretzels. Fat-free sweets. What foods are not recommended? The items listed may not be a complete list. Talk with your dietitian about what dietary choices are best for you. Grains Baked goods made with fat, such as croissants, muffins, or some breads. Dry pasta or rice meal packs. Vegetables Creamed or fried vegetables. Vegetables in a cheese sauce. Regular canned vegetables (not low-sodium or reduced-sodium). Regular canned tomato sauce and paste (not low-sodium or reduced-sodium). Regular tomato and vegetable juice (not low-sodium or reduced-sodium). Angie Fava. Olives. Fruits Canned fruit in a light or heavy syrup. Fried fruit. Fruit in cream or butter sauce. Meat and other protein foods Fatty cuts of meat. Ribs. Fried meat. Berniece Salines. Sausage. Bologna and other processed lunch meats. Salami. Fatback. Hotdogs. Bratwurst. Salted nuts and seeds. Canned beans with added salt. Canned  or smoked fish. Whole eggs or egg yolks. Chicken or [place] with skin. Dairy Whole or 2% milk, cream, and half-and-half. Whole or full-fat cream cheese. Whole-fat or sweetened yogurt. Full-fat cheese. Nondairy creamers. Whipped toppings. Processed cheese and cheese spreads. Fats and oils Butter. Stick margarine. Lard. Shortening. Ghee. Bacon fat. Tropical oils, such as coconut, palm kernel, or palm oil. Seasoning and other foods Salted popcorn and pretzels. Onion salt, garlic salt, seasoned salt, table salt, and sea salt. Worcestershire sauce. Tartar sauce. Barbecue sauce. Teriyaki sauce. Soy sauce, including reduced-sodium. Steak sauce. Canned and packaged gravies. Fish sauce. Oyster  sauce. Cocktail sauce. Horseradish that you find on the shelf. Ketchup. Mustard. Meat flavorings and tenderizers. Bouillon cubes. Hot sauce and Tabasco sauce. Premade or packaged marinades. Premade or packaged taco seasonings. Relishes. Regular salad dressings. Where to find more information:  National Heart, Lung, and [place]: https://wilson-eaton.com/  American Heart Association: www.heart.org Summary  The DASH eating plan is a healthy eating plan that has been shown to reduce high blood pressure (hypertension). It may also reduce your risk for type 2 diabetes, heart disease, and stroke.  With the DASH eating plan, you should limit salt (sodium) intake to 2,300 mg a day. If you have hypertension, you may need to reduce your sodium intake to 1,500 mg a day.  When on the DASH eating plan, aim to eat more fresh fruits and vegetables, whole grains, lean proteins, low-fat dairy, and heart-healthy fats.  Work with your health care provider or diet and nutrition specialist (dietitian) to adjust your eating plan to your individual calorie needs. This information is not intended to replace advice given to you by your health care provider. Make sure you discuss any questions you have with your health care provider. Document Released: 07/18/2011 Document Revised: 07/22/2016 Document Reviewed: 07/22/2016 Elsevier Interactive Patient Education  [employer].

## 2018-05-27 NOTE — Progress Notes (Signed)
Cardiology Office Note   Date:  05/27/2018   ID:  Shaneka Efaw, DOB 1956-07-24, MRN [id number]  PCP:  Girtha Rm, NP-C  Cardiologist:  [place]  Chief Complaint  Patient presents with  . Congestive Heart Failure  . Cardiomyopathy     History of Present Illness: Lilliauna Van is a 62 y.o. female who presents for ongoing assessment and management of dilated cardiomyopathy. She was first seen to be established with our practice by Dr. Ellyn Hack on 04/30/2017. She had formally been seen by a cardiologist in [place], and had been placed on carvedilol, Entresto, and spironolactone. Most recent echocardiogram 08/26/2017 demonstrated an EF of 45%-50% with grade 1 diastolic dysfunction. She was to continue her current regimen with sliding scale lasix for weight gain >3 lbs.   She is here today for refills on her medications. She has been out of them for > 3 days. She has also not adhered to a low sodium diet. She is eating a lot of fast food, pickles and potato chips. She states she has gained about 12 lbs. She admits to feeling bloated.   Past Medical History:  Diagnosis Date  . Abnormal myocardial perfusion study 03/15/2016   EF 15%, severe LV systolic dysfcn, global hypokinesis and inferior akinesis. dilated LV  . Abnormal x-ray of lungs with single pulmonary nodule 03/15/2016   per records from [place]   . Allergy   . Anemia   . Asthma   . Blind   . CHF (congestive heart failure) ([place])   . Cholelithiasis 05/19/2017   On CT  . COPD (chronic obstructive pulmonary disease) ([place])    PT DENIES  . Coronary artery calcification seen on CAT scan 05/19/2017  . 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 Diagnosed in [facility]  . Family history of colon cancer   . Genetic testing 06/19/2017   Multi-Cancer panel (83 genes) @ [facility] - No pathogenic mutations detected  . Headache    NONE RECENT  . Hypertension   . Malignant neoplasm of  lower-outer quadrant of left breast of female, estrogen receptor negative ([place]) 06/11/2017   left breast  . Retinitis pigmentosa   . Solitary pulmonary nodule on lung CT 03/16/2016   per records from [place]  . Uveitis     Past Surgical History:  Procedure Laterality Date  . brain cyst removed  2007   to help with headaches per patient , aspirated   . BREAST LUMPECTOMY WITH RADIOACTIVE SEED AND SENTINEL LYMPH NODE BIOPSY Left 06/26/2017   Procedure: BREAST LUMPECTOMY WITH RADIOACTIVE SEED AND SENTINEL LYMPH NODE BIOPSY ERAS  PATHWAY;  Surgeon: [name], MD;  Location: [place];  Service: General;  Laterality: Left;  pec block  . NASAL TURBINATE REDUCTION    . PORTACATH PLACEMENT N/A 06/26/2017   Procedure: INSERTION PORT-A-CATH;  Surgeon: [name], MD;  Location: [place];  Service: General;  Laterality: N/A;  . TRANSTHORACIC ECHOCARDIOGRAM  12/2015   [facility] May 2017: Mild concentric LVH. Global hypokinesis. GR daily. EF 18% severe LA dilation. Mitral annular dilatation with papillary muscle dysfunction and moderate MR. Dilated IVC consistent with elevated RAP.     Current Outpatient Medications  Medication Sig Dispense Refill  . aspirin 81 MG chewable tablet Chew 81 mg by mouth daily.    . carvedilol (COREG) 12.5 MG tablet Take 1 tablet (12.5 mg total) by mouth 2 (two) times daily with a meal. 180 tablet 1  . furosemide (LASIX) 40 MG  tablet Take 1 tablet (40 mg total) by mouth daily. 92 tablet 1  . gabapentin (NEURONTIN) 300 MG capsule Take 1 capsule (300 mg total) by mouth 3 (three) times daily. 90 capsule 1  . lidocaine-prilocaine (EMLA) cream Apply to affected area once 30 g 3  . naproxen (NAPROSYN) 500 MG tablet Take 1 tablet (500 mg total) by mouth daily as needed for mild pain. 20 tablet 1  . sacubitril-valsartan (ENTRESTO) 24-26 MG Take 1 tablet by mouth 2 (two) times daily. 60 tablet 5  . spironolactone (ALDACTONE) 25 MG tablet Take 1 tablet (25 mg total) by  mouth daily. 90 tablet 1  . vitamin C (ASCORBIC ACID) 500 MG tablet Take 1,000 mg 2 (two) times daily by mouth.     No current facility-administered medications for this visit.     Allergies:   Latex; Tylenol with codeine #3 [acetaminophen-codeine]; and Penicillins    Social History:  The patient  reports that she quit smoking about 2 years ago. Her smoking use included cigarettes. She has a 5.75 pack-year smoking history. She has never used smokeless tobacco. She reports that she does not drink alcohol or use drugs.   Family History:  The patient's family history includes Breast cancer (age of onset: 51) in her other; Colon cancer (age of onset: 57) in her mother; Heart attack in her father; Pancreatic cancer in her other.    ROS: All other systems are reviewed and negative. Unless otherwise mentioned in H&P    PHYSICAL EXAM: VS:  BP (!) 156/82 (BP Location: Right Arm, Patient Position: Sitting, Cuff Size: Large)   Pulse 67   Ht 5\' 7"  (1.702 m)   Wt 248 lb (112.5 kg)   BMI 38.84 kg/m  , BMI Body mass index is 38.84 kg/m. GEN: Well nourished, well developed, in no acute distress HEENT: normal Neck: no JVD, carotid bruits, or masses Cardiac: RRR; distant heart sounds,no  rubs, or gallops,no edema  Respiratory:  Clear to auscultation bilaterally, normal work of breathing, mild crackles in  the right base.  GI: soft, nontender, mild distention with ballotment  + BS MS: no deformity or atrophy Skin: warm and dry, no rash Neuro:  Strength and sensation are intact Psych: euthymic mood, full affect   EKG:  Sinus rhythm with 1st degree AV block. Mild LVH.  Recent Labs: 08/25/2017: Magnesium 1.8 03/16/2018: ALT 16; BUN 16; Creatinine, Ser 0.91; Hemoglobin 11.7; Platelets 277; Potassium 3.7; Sodium 142    Lipid Panel    Component Value Date/Time   CHOL 195 03/20/2017 0943   TRIG 104 03/20/2017 0943   HDL 46 (L) 03/20/2017 0943   CHOLHDL 4.2 03/20/2017 0943   VLDL 21 03/20/2017  0943   LDLCALC 128 (H) 03/20/2017 0943      Wt Readings from Last 3 Encounters:  05/27/18 248 lb (112.5 kg)  05/26/18 246 lb 9.6 oz (111.9 kg)  04/24/18 235 lb (106.6 kg)      Other studies Reviewed: Echocardiogram August 24, 2017 Left ventricle: The cavity size was normal. Wall thickness was   increased in a pattern of mild LVH. Systolic function was mildly   reduced. The estimated ejection fraction was in the range of 45%   to 50%. Diffuse hypokinesis. Doppler parameters are consistent   with abnormal left ventricular relaxation (grade 1 diastolic   dysfunction). - Aortic valve: There was no stenosis. - Mitral valve: Mildly calcified annulus. Normal thickness leaflets   . There was trivial regurgitation. - Right ventricle: The cavity  size was normal. Systolic function   was normal. - Tricuspid valve: Peak RV-RA gradient (S): 14 mm Hg. - Pulmonary arteries: PA peak pressure: 17 mm Hg (S). - Inferior vena cava: The vessel was normal in size. The   respirophasic diameter changes were in the normal range (>= 50%),   consistent with normal central venous pressure.  ASSESSMENT AND PLAN:  1. Acute on Chronic Systolic CHF: She has gained 12 lbs and ran out of her medications for >3 days. She is also not adherent to low sodium diet. I have done extensive counseling concerning diet and daily weights, medication compliance.   She is to take lasix 40 this evening, and then take 40 mg BID tomorrow, and then 40 mg daily. She will have a BMET and BNP today and then one again in a week. She is to weight daily.   2. Hypertension: Multifactorial. Fluid overload, medical and dietary non-compliance.   3.Obesity: Weight loss and exercise is recommended.   Current medicines are reviewed at length with the patient today.    Labs/ tests ordered today include: BMET BNP today and again in one week.   Phill Myron. West Pugh, ANP, AACC   05/27/2018 4:09 PM    [facility] [address] Office [phone] Fax [phone]

## 2018-05-28 ENCOUNTER — Telehealth: Payer: Self-pay | Admitting: Hematology

## 2018-05-28 ENCOUNTER — Telehealth: Payer: Self-pay

## 2018-05-28 NOTE — Telephone Encounter (Signed)
Appts scheduled LMVM for patient with date/time per 10/17 sch msg

## 2018-05-28 NOTE — Telephone Encounter (Signed)
Patient calls stating her cardiologist cannot get blood for needed labs.  She has an order.  Told her to bring it with her and I will set up a lab/flush appointment as soon as possible.  Scheduling will call with an appointment.  She verbalized an understanding.

## 2018-06-01 ENCOUNTER — Ambulatory Visit
Admission: RE | Admit: 2018-06-01 | Discharge: 2018-06-01 | Disposition: A | Discharge status: Home / Self Care | Payer: Medicare HMO | Source: Ambulatory Visit | Attending: Hematology | Admitting: Hematology

## 2018-06-01 ENCOUNTER — Other Ambulatory Visit: Payer: Self-pay | Admitting: Hematology

## 2018-06-01 DIAGNOSIS — Z171 Estrogen receptor negative status [ER-]: Principal | ICD-10-CM

## 2018-06-01 DIAGNOSIS — N644 Mastodynia: Secondary | ICD-10-CM

## 2018-06-01 DIAGNOSIS — R922 Inconclusive mammogram: Secondary | ICD-10-CM | POA: Diagnosis not present

## 2018-06-01 DIAGNOSIS — Z853 Personal history of malignant neoplasm of breast: Secondary | ICD-10-CM | POA: Diagnosis not present

## 2018-06-01 DIAGNOSIS — R234 Changes in skin texture: Secondary | ICD-10-CM

## 2018-06-01 DIAGNOSIS — C50512 Malignant neoplasm of lower-outer quadrant of left female breast: Secondary | ICD-10-CM

## 2018-06-01 HISTORY — DX: Personal history of irradiation: Z92.3

## 2018-06-01 HISTORY — DX: Personal history of antineoplastic chemotherapy: Z92.21

## 2018-06-02 ENCOUNTER — Inpatient Hospital Stay: Payer: Medicare HMO | Attending: Hematology

## 2018-06-02 ENCOUNTER — Inpatient Hospital Stay: Payer: Medicare HMO

## 2018-06-02 DIAGNOSIS — Z95828 Presence of other vascular implants and grafts: Secondary | ICD-10-CM

## 2018-06-02 DIAGNOSIS — C50512 Malignant neoplasm of lower-outer quadrant of left female breast: Secondary | ICD-10-CM

## 2018-06-02 DIAGNOSIS — Z853 Personal history of malignant neoplasm of breast: Secondary | ICD-10-CM | POA: Diagnosis not present

## 2018-06-02 DIAGNOSIS — Z452 Encounter for adjustment and management of vascular access device: Secondary | ICD-10-CM | POA: Insufficient documentation

## 2018-06-02 DIAGNOSIS — Z171 Estrogen receptor negative status [ER-]: Secondary | ICD-10-CM

## 2018-06-02 LAB — CBC WITH DIFFERENTIAL/PLATELET
Abs Immature Granulocytes: 0.01 10*3/uL (ref 0.00–0.07)
Basophils Absolute: 0 10*3/uL (ref 0.0–0.1)
Basophils Relative: 0 %
Eosinophils Absolute: 0.1 10*3/uL (ref 0.0–0.5)
Eosinophils Relative: 3 %
HEMATOCRIT: 38.9 % (ref 36.0–46.0)
HEMOGLOBIN: 12.5 g/dL (ref 12.0–15.0)
Immature Granulocytes: 0 %
LYMPHS ABS: 1.4 10*3/uL (ref 0.7–4.0)
LYMPHS PCT: 28 %
MCH: 29.1 pg (ref 26.0–34.0)
MCHC: 32.1 g/dL (ref 30.0–36.0)
MCV: 90.5 fL (ref 80.0–100.0)
MONO ABS: 0.5 10*3/uL (ref 0.1–1.0)
Monocytes Relative: 10 %
Neutro Abs: 2.8 10*3/uL (ref 1.7–7.7)
Neutrophils Relative %: 59 %
Platelets: 272 10*3/uL (ref 150–400)
RBC: 4.3 MIL/uL (ref 3.87–5.11)
RDW: 13.1 % (ref 11.5–15.5)
WBC: 4.8 10*3/uL (ref 4.0–10.5)
nRBC: 0 % (ref 0.0–0.2)

## 2018-06-02 LAB — COMPREHENSIVE METABOLIC PANEL
ALBUMIN: 4.1 g/dL (ref 3.5–5.0)
ALT: 45 U/L — AB (ref 0–44)
AST: 31 U/L (ref 15–41)
Alkaline Phosphatase: 119 U/L (ref 38–126)
Anion gap: 11 (ref 5–15)
BUN: 16 mg/dL (ref 6–20)
CHLORIDE: 108 mmol/L (ref 98–111)
CO2: 23 mmol/L (ref 22–32)
Calcium: 9.7 mg/dL (ref 8.9–10.3)
Creatinine, Ser: 0.86 mg/dL (ref 0.44–1.00)
GFR calc Af Amer: >60 mL/min (ref >60)
GFR calc non Af Amer: >60 mL/min (ref >60)
Glucose, Bld: 114 mg/dL — ABNORMAL HIGH (ref 70–99)
POTASSIUM: 4.2 mmol/L (ref 3.5–5.1)
SODIUM: 142 mmol/L (ref 135–145)
Total Bilirubin: 0.5 mg/dL (ref 0.3–1.2)
Total Protein: 7.8 g/dL (ref 6.5–8.1)

## 2018-06-02 MED ORDER — HEPARIN SOD (PORK) LOCK FLUSH 100 UNIT/ML IV SOLN
500.0000 [IU] | Freq: Once | INTRAVENOUS | Status: AC | PRN
  Administered 2018-06-02: 500 [IU] via INTRAVENOUS
  Filled 2018-06-02: qty 5

## 2018-06-02 MED ORDER — SODIUM CHLORIDE 0.9% FLUSH
10.0000 mL | INTRAVENOUS | Status: DC | PRN
  Administered 2018-06-02: 10 mL via INTRAVENOUS
  Filled 2018-06-02: qty 10

## 2018-06-08 NOTE — Progress Notes (Deleted)
Cardiology Office Note   Date:  06/08/2018   ID:  Evangelia, Whitaker 1956/01/02, MRN [id number]  PCP:  Girtha Rm, NP-C  Cardiologist:  Dr.Harding  No chief complaint on file.    History of Present Illness: Kristin Ward is a 62 y.o. female who presents for assessment and management of dilated cardiomyopathy. She was first seen to be established with our practice by Dr. Ellyn Hack on 04/30/2017. She had formally been seen by a cardiologist in [place], and had been placed on carvedilol, Entresto, and spironolactone. Most recent echocardiogram 08/26/2017 demonstrated an EF of 45%-50% with grade 1 diastolic dysfunction.   On last office visit, she had gained 12 lbs and had run out of her medications.She was increased on lasix to 40 mg BID for two days and then return to daily 40 mg. BMET was ordered. She was counseled on low sodium diet and increased exercise.    Labs reviewed and she was found to have normal creatinine, potassium and H&H.   Past Medical History:  Diagnosis Date  . Abnormal myocardial perfusion study 03/15/2016   EF 15%, severe LV systolic dysfcn, global hypokinesis and inferior akinesis. dilated LV  . Abnormal x-ray of lungs with single pulmonary nodule 03/15/2016   per records from [place]   . Allergy   . Anemia   . Asthma   . Blind   . CHF (congestive heart failure) ([place])   . Cholelithiasis 05/19/2017   On CT  . COPD (chronic obstructive pulmonary disease) ([place])    PT DENIES  . Coronary artery calcification seen on CAT scan 05/19/2017  . Dilated idiopathic cardiomyopathy ([place]) 12/2015   EF 15-20%. Diagnosed in [facility]  . Family history of colon cancer   . Genetic testing 06/19/2017   Multi-Cancer panel (83 genes) @ [facility] - No pathogenic mutations detected  . Headache    NONE RECENT  . Hypertension   . Malignant neoplasm of lower-outer quadrant of left breast of female, estrogen receptor negative ([place]) 06/11/2017   left  breast  . Personal history of chemotherapy 2018-2019  . Personal history of radiation therapy 2019  . Retinitis pigmentosa   . Solitary pulmonary nodule on lung CT 03/16/2016   per records from [place]  . Uveitis     Past Surgical History:  Procedure Laterality Date  . brain cyst removed  2007   to help with headaches per patient , aspirated   . BREAST BIOPSY Left 06/06/2017   x2  . BREAST CYST ASPIRATION Left 06/06/2017  . BREAST LUMPECTOMY Left 06/26/2017  . BREAST LUMPECTOMY WITH RADIOACTIVE SEED AND SENTINEL LYMPH NODE BIOPSY Left 06/26/2017   Procedure: BREAST LUMPECTOMY WITH RADIOACTIVE SEED AND SENTINEL LYMPH NODE BIOPSY ERAS  PATHWAY;  Surgeon: [name], MD;  Location: [place];  Service: General;  Laterality: Left;  pec block  . NASAL TURBINATE REDUCTION    . PORTACATH PLACEMENT N/A 06/26/2017   Procedure: INSERTION PORT-A-CATH;  Surgeon: [name], MD;  Location: [place];  Service: General;  Laterality: N/A;  . TRANSTHORACIC ECHOCARDIOGRAM  12/2015   [facility] May 2017: Mild concentric LVH. Global hypokinesis. GR daily. EF 18% severe LA dilation. Mitral annular dilatation with papillary muscle dysfunction and moderate MR. Dilated IVC consistent with elevated RAP.     Current Outpatient Medications  Medication Sig Dispense Refill  . aspirin 81 MG chewable tablet Chew 81 mg by mouth daily.    . carvedilol (COREG) 12.5 MG tablet Take 1 tablet (12.5 mg  total) by mouth 2 (two) times daily with a meal. 180 tablet 1  . furosemide (LASIX) 40 MG tablet Take 1 tablet (40 mg total) by mouth daily. 92 tablet 1  . gabapentin (NEURONTIN) 300 MG capsule Take 1 capsule (300 mg total) by mouth 3 (three) times daily. 90 capsule 1  . lidocaine-prilocaine (EMLA) cream Apply to affected area once 30 g 3  . naproxen (NAPROSYN) 500 MG tablet Take 1 tablet (500 mg total) by mouth daily as needed for mild pain. 20 tablet 1  . sacubitril-valsartan (ENTRESTO) 24-26 MG Take 1 tablet  by mouth 2 (two) times daily. 60 tablet 5  . spironolactone (ALDACTONE) 25 MG tablet Take 1 tablet (25 mg total) by mouth daily. 90 tablet 1  . vitamin C (ASCORBIC ACID) 500 MG tablet Take 1,000 mg 2 (two) times daily by mouth.     No current facility-administered medications for this visit.     Allergies:   Latex; Tylenol with codeine #3 [acetaminophen-codeine]; and Penicillins    Social History:  The patient  reports that she quit smoking about 2 years ago. Her smoking use included cigarettes. She has a 5.75 pack-year smoking history. She has never used smokeless tobacco. She reports that she does not drink alcohol or use drugs.   Family History:  The patient's family history includes Breast cancer (age of onset: 35) in her other; Colon cancer (age of onset: 76) in her mother; Heart attack in her father; Pancreatic cancer in her other.    ROS: All other systems are reviewed and negative. Unless otherwise mentioned in H&P    PHYSICAL EXAM: VS:  There were no vitals taken for this visit. , BMI There is no height or weight on file to calculate BMI. GEN: Well nourished, well developed, in no acute distress HEENT: normal Neck: no JVD, carotid bruits, or masses Cardiac: ***RRR; no murmurs, rubs, or gallops,no edema  Respiratory:  Clear to auscultation bilaterally, normal work of breathing GI: soft, nontender, nondistended, + BS MS: no deformity or atrophy Skin: warm and dry, no rash Neuro:  Strength and sensation are intact Psych: euthymic mood, full affect   EKG:  EKG {ACTION; IS/IS [id number]} ordered today. The ekg ordered today demonstrates ***   Recent Labs: 08/25/2017: Magnesium 1.8 06/02/2018: ALT 45; BUN 16; Creatinine, Ser 0.86; Hemoglobin 12.5; Platelets 272; Potassium 4.2; Sodium 142    Lipid Panel    Component Value Date/Time   CHOL 195 03/20/2017 0943   TRIG 104 03/20/2017 0943   HDL 46 (L) 03/20/2017 0943   CHOLHDL 4.2 03/20/2017 0943   VLDL 21 03/20/2017  0943   LDLCALC 128 (H) 03/20/2017 0943      Wt Readings from Last 3 Encounters:  05/27/18 248 lb (112.5 kg)  05/26/18 246 lb 9.6 oz (111.9 kg)  04/24/18 235 lb (106.6 kg)      Other studies Reviewed: Additional studies/ records that were reviewed today include: ***. Review of the above records demonstrates: ***   ASSESSMENT AND PLAN:  1.  ***   Current medicines are reviewed at length with the patient today.    Labs/ tests ordered today include: *** Phill Myron. West Pugh, ANP, AACC   06/08/2018 1:03 PM    [place] [address] Office [phone] Fax [phone]

## 2018-06-09 ENCOUNTER — Other Ambulatory Visit: Payer: Self-pay

## 2018-06-09 ENCOUNTER — Encounter: Payer: Self-pay | Admitting: Gastroenterology

## 2018-06-09 ENCOUNTER — Ambulatory Visit (AMBULATORY_SURGERY_CENTER): Payer: Medicare HMO | Admitting: Gastroenterology

## 2018-06-09 VITALS — BP 155/93 | HR 93 | Temp 97.3°F | Resp 20 | Ht 67.0 in | Wt 246.0 lb

## 2018-06-09 DIAGNOSIS — Z1211 Encounter for screening for malignant neoplasm of colon: Secondary | ICD-10-CM

## 2018-06-09 DIAGNOSIS — K635 Polyp of colon: Secondary | ICD-10-CM | POA: Diagnosis not present

## 2018-06-09 DIAGNOSIS — I2510 Atherosclerotic heart disease of native coronary artery without angina pectoris: Secondary | ICD-10-CM | POA: Diagnosis not present

## 2018-06-09 DIAGNOSIS — D124 Benign neoplasm of descending colon: Secondary | ICD-10-CM

## 2018-06-09 DIAGNOSIS — J449 Chronic obstructive pulmonary disease, unspecified: Secondary | ICD-10-CM | POA: Diagnosis not present

## 2018-06-09 DIAGNOSIS — Z800 Family history of malignant neoplasm of digestive organs: Secondary | ICD-10-CM

## 2018-06-09 DIAGNOSIS — I509 Heart failure, unspecified: Secondary | ICD-10-CM | POA: Diagnosis not present

## 2018-06-09 MED ORDER — SODIUM CHLORIDE 0.9 % IV SOLN: 500.0000 mL | Freq: Once | INTRAVENOUS | Status: DC

## 2018-06-09 NOTE — Progress Notes (Signed)
Pt's states no medical or surgical changes since previsit or office visit. 

## 2018-06-09 NOTE — Progress Notes (Signed)
Called to room to assist during endoscopic procedure.  Patient ID and intended procedure confirmed with present staff. Received instructions for my participation in the procedure from the performing physician.  

## 2018-06-09 NOTE — Progress Notes (Signed)
Report given to PACU, vss 

## 2018-06-09 NOTE — Op Note (Signed)
[place] Patient Name: Kristin Ward Procedure Date: 06/09/2018 6:56 AM MRN: [id number] Endoscopist: Mauri Pole , MD Age: 62 Referring MD:  Date of Birth: June 02, 1956 Gender: Female Account #: [id number] Procedure:                Colonoscopy Indications:              Screening patient at increased risk: Family history                            of 1st-degree relative with colorectal cancer at                            age 2 years (or older) Medicines:                Monitored Anesthesia Care Procedure:                Pre-Anesthesia Assessment:                           - Prior to the procedure, a History and Physical                            was performed, and patient medications and                            allergies were reviewed. The patient's tolerance of                            previous anesthesia was also reviewed. The risks                            and benefits of the procedure and the sedation                            options and risks were discussed with the patient.                            All questions were answered, and informed consent                            was obtained. Prior Anticoagulants: The patient has                            taken no previous anticoagulant or antiplatelet                            agents. ASA Grade Assessment: III - A patient with                            severe systemic disease. After reviewing the risks                            and benefits, the patient was deemed in  satisfactory condition to undergo the procedure.                           After obtaining informed consent, the colonoscope                            was passed under direct vision. Throughout the                            procedure, the patient's blood pressure, pulse, and                            oxygen saturations were monitored continuously. The                            Colonoscope was  introduced through the anus and                            advanced to the the terminal ileum, with                            identification of the appendiceal orifice and IC                            valve. The colonoscopy was performed without                            difficulty. The patient tolerated the procedure                            well. The quality of the bowel preparation was                            excellent. The terminal ileum, ileocecal valve,                            appendiceal orifice, and rectum were photographed. Scope In: 8:07:51 AM Scope Out: 8:29:54 AM Scope Withdrawal Time: 0 hours 13 minutes 1 second  Total Procedure Duration: 0 hours 22 minutes 3 seconds  Findings:                 The perianal and digital rectal examinations were                            normal.                           A 7 mm polyp was found in the descending colon. The                            polyp was sessile. The polyp was removed with a                            cold snare. Resection and retrieval were complete.  Scattered small and large-mouthed diverticula were                            found in the sigmoid colon and descending colon.                           External and internal hemorrhoids were found during                            retroflexion. The hemorrhoids were large. Complications:            No immediate complications. Estimated Blood Loss:     Estimated blood loss was minimal. Impression:               - One 7 mm polyp in the descending colon, removed                            with a cold snare. Resected and retrieved.                           - Mild diverticulosis in the sigmoid colon and in                            the descending colon.                           - External and internal hemorrhoids. Recommendation:           - Patient has a contact number available for                            emergencies. The signs and  symptoms of potential                            delayed complications were discussed with the                            patient. Return to normal activities tomorrow.                            Written discharge instructions were provided to the                            patient.                           - Resume previous diet.                           - Continue present medications.                           - Await pathology results.                           - Repeat colonoscopy in 5 years for surveillance  based on pathology results. Mauri Pole, MD 06/09/2018 8:34:26 AM This report has been signed electronically.

## 2018-06-10 ENCOUNTER — Telehealth: Payer: Self-pay | Admitting: *Deleted

## 2018-06-10 ENCOUNTER — Ambulatory Visit: Payer: Medicare HMO | Admitting: Adult Health

## 2018-06-10 ENCOUNTER — Telehealth: Payer: Self-pay

## 2018-06-10 NOTE — Progress Notes (Signed)
Cardiology Office Note   Date:  06/15/2018   ID:  Kristin Ward, DOB Aug 03, 1956, MRN [id number]  PCP:  Girtha Rm, NP-C  Cardiologist:  Dr. Ellyn Hack  Chief Complaint  Patient presents with  . Congestive Heart Failure  . Hypertension     History of Present Illness: Kristin Ward is a 62 y.o. female who presents for ongoing assessment and management of dilated cardiomyopathy, with echo in 08/26/2017 demonstrating EF of 45%-50%, with Grade I diastolic dysfunction.   On last office visit, she had gained 12 lbs, had run out of her medications for 3 days, and had not been adherent to low sodium diet. She was to increase her lasix dose to 40 mg BID for 2 days, and then begin taking 40 mg daily thereafter. She was to have a BNP and a BMET with follow up labs just before appt to compare. Labs 06/02/2018 Creatinine 0.86.  She has been out of her medications since May 27, 2018.  She states she is having trouble with her insurance company covering her medications. She requests medications be sent to a local pharmacy. She is not adhering to low sodium diet.   Past Medical History:  Diagnosis Date  . Abnormal myocardial perfusion study 03/15/2016   EF 15%, severe LV systolic dysfcn, global hypokinesis and inferior akinesis. dilated LV  . Abnormal x-ray of lungs with single pulmonary nodule 03/15/2016   per records from [place]   . Allergy   . Anemia   . Asthma   . Blind   . CHF (congestive heart failure) ([place])   . Cholelithiasis 05/19/2017   On CT  . COPD (chronic obstructive pulmonary disease) ([place])    PT DENIES  . Coronary artery calcification seen on CAT scan 05/19/2017  . Dilated idiopathic cardiomyopathy ([place]) 12/2015   EF 15-20%. Diagnosed in [facility]  . Family history of colon cancer   . Genetic testing 06/19/2017   Multi-Cancer panel (83 genes) @ [facility] - No pathogenic mutations detected  . Headache    NONE RECENT  . Hypertension   .  Malignant neoplasm of lower-outer quadrant of left breast of female, estrogen receptor negative ([place]) 06/11/2017   left breast  . Personal history of chemotherapy 2018-2019  . Personal history of radiation therapy 2019  . Retinitis pigmentosa   . Solitary pulmonary nodule on lung CT 03/16/2016   per records from [place]  . Uveitis     Past Surgical History:  Procedure Laterality Date  . brain cyst removed  2007   to help with headaches per patient , aspirated   . BREAST BIOPSY Left 06/06/2017   x2  . BREAST CYST ASPIRATION Left 06/06/2017  . BREAST LUMPECTOMY Left 06/26/2017  . BREAST LUMPECTOMY WITH RADIOACTIVE SEED AND SENTINEL LYMPH NODE BIOPSY Left 06/26/2017   Procedure: BREAST LUMPECTOMY WITH RADIOACTIVE SEED AND SENTINEL LYMPH NODE BIOPSY ERAS  PATHWAY;  Surgeon: [name], MD;  Location: [place];  Service: General;  Laterality: Left;  pec block  . NASAL TURBINATE REDUCTION    . PORTACATH PLACEMENT N/A 06/26/2017   Procedure: INSERTION PORT-A-CATH;  Surgeon: [name], MD;  Location: [place];  Service: General;  Laterality: N/A;  . TRANSTHORACIC ECHOCARDIOGRAM  12/2015   [facility] May 2017: Mild concentric LVH. Global hypokinesis. GR daily. EF 18% severe LA dilation. Mitral annular dilatation with papillary muscle dysfunction and moderate MR. Dilated IVC consistent with elevated RAP.     Current Outpatient Medications  Medication Sig Dispense  Refill  . aspirin 81 MG chewable tablet Chew 81 mg by mouth daily.    . carvedilol (COREG) 12.5 MG tablet Take 1 tablet (12.5 mg total) by mouth 2 (two) times daily with a meal. 180 tablet 1  . furosemide (LASIX) 40 MG tablet Take 1 tablet (40 mg total) by mouth daily. 92 tablet 1  . gabapentin (NEURONTIN) 300 MG capsule Take 1 capsule (300 mg total) by mouth 3 (three) times daily. 90 capsule 1  . lidocaine-prilocaine (EMLA) cream Apply to affected area once 30 g 3  . naproxen (NAPROSYN) 500 MG tablet Take 1 tablet (500  mg total) by mouth daily as needed for mild pain. 20 tablet 1  . sacubitril-valsartan (ENTRESTO) 24-26 MG Take 1 tablet by mouth 2 (two) times daily. 60 tablet 5  . spironolactone (ALDACTONE) 25 MG tablet Take 1 tablet (25 mg total) by mouth daily. 90 tablet 1  . vitamin C (ASCORBIC ACID) 500 MG tablet Take 1,000 mg 2 (two) times daily by mouth.     No current facility-administered medications for this visit.     Allergies:   Latex; Tylenol with codeine #3 [acetaminophen-codeine]; and Penicillins    Social History:  The patient  reports that she quit smoking about 2 years ago. Her smoking use included cigarettes. She has a 5.75 pack-year smoking history. She has never used smokeless tobacco. She reports that she does not drink alcohol or use drugs.   Family History:  The patient's family history includes Breast cancer (age of onset: 51) in her other; Colon cancer (age of onset: 35) in her mother; Heart attack in her father; Pancreatic cancer in her other.    ROS: All other systems are reviewed and negative. Unless otherwise mentioned in H&P    PHYSICAL EXAM: VS:  BP (!) 148/94   Pulse 98   Ht 5\' 7"  (1.702 m)   Wt 243 lb (110.2 kg)   BMI 38.06 kg/m  , BMI Body mass index is 38.06 kg/m. GEN: Well nourished, well developed, in no acute distress HEENT: normal Neck: no JVD, carotid bruits, or masses Cardiac:RRR; tachycardic, 1/6 systolic  murmurs, rubs, or gallops,no edema  Respiratory:  Clear to auscultation bilaterally, normal work of breathing GI: soft, nontender, nondistended, + BS MS: no deformity or atrophy Skin: warm and dry, no rash Neuro:  Strength and sensation are intact Psych: euthymic mood, full affect   EKG: Not completed this office visit.   Recent Labs: 08/25/2017: Magnesium 1.8 06/02/2018: ALT 45; BUN 16; Creatinine, Ser 0.86; Hemoglobin 12.5; Platelets 272; Potassium 4.2; Sodium 142    Lipid Panel    Component Value Date/Time   CHOL 195 03/20/2017 0943    TRIG 104 03/20/2017 0943   HDL 46 (L) 03/20/2017 0943   CHOLHDL 4.2 03/20/2017 0943   VLDL 21 03/20/2017 0943   LDLCALC 128 (H) 03/20/2017 0943      Wt Readings from Last 3 Encounters:  06/15/18 243 lb (110.2 kg)  06/09/18 246 lb (111.6 kg)  05/27/18 248 lb (112.5 kg)      Other studies Reviewed: Echocardiogram 09/24/17 Left ventricle: The cavity size was normal. Wall thickness was   increased in a pattern of mild LVH. Systolic function was mildly   reduced. The estimated ejection fraction was in the range of 45%   to 50%. Diffuse hypokinesis. Doppler parameters are consistent   with abnormal left ventricular relaxation (grade 1 diastolic   dysfunction). - Aortic valve: There was no stenosis. - Mitral  valve: Mildly calcified annulus. Normal thickness leaflets   . There was trivial regurgitation. - Right ventricle: The cavity size was normal. Systolic function   was normal. - Tricuspid valve: Peak RV-RA gradient (S): 14 mm Hg. - Pulmonary arteries: PA peak pressure: 17 mm Hg (S). - Inferior vena cava: The vessel was normal in size. The   respirophasic diameter changes were in the normal range (>= 50%),   consistent with normal central venous pressure.  ASSESSMENT AND PLAN:  1. Dilated cardiomyopathy: Most recent echo revealed low normal EF, but she has not been taking medications regularly. She will be restarted on her medications and they will be sent to [facility] on [address].  Will see her back in 6 weeks with BMET.   2. Hypertension: BP is elevated today. She will be started back on her medications for #1. Will follow up on BP with oncologist next week. She is advised to avoid NSAID's.   Current medicines are reviewed at length with the patient today.    Labs/ tests ordered today include:  BMET  Phill Myron. West Pugh, ANP, AACC   06/15/2018 3:26 PM    [address] [place] [address] Office [phone] Fax [phone]

## 2018-06-10 NOTE — Telephone Encounter (Signed)
  Follow up Call-  Call back number 06/09/2018  Post procedure Call Back phone  # [phone]  Permission to leave phone message Yes  Some recent data might be hidden     Patient questions:  Do you have a fever, pain , or abdominal swelling? No. Pain Score  0 *  Have you tolerated food without any problems? Yes.    Have you been able to return to your normal activities? Yes.    Do you have any questions about your discharge instructions: Diet   No. Medications  No. Follow up visit  No.  Do you have questions or concerns about your Care? No.  Actions: * If pain score is 4 or above: No action needed, pain <4.

## 2018-06-10 NOTE — Telephone Encounter (Signed)
Attempted follow up call.  No answer.  Will call again this afternoon.

## 2018-06-15 ENCOUNTER — Encounter: Payer: Self-pay | Admitting: Adult Health

## 2018-06-15 ENCOUNTER — Ambulatory Visit (INDEPENDENT_AMBULATORY_CARE_PROVIDER_SITE_OTHER): Payer: Medicare HMO | Admitting: Adult Health

## 2018-06-15 VITALS — BP 148/94 | HR 98 | Ht 67.0 in | Wt 243.0 lb

## 2018-06-15 DIAGNOSIS — Z79899 Other long term (current) drug therapy: Secondary | ICD-10-CM

## 2018-06-15 DIAGNOSIS — I10 Essential (primary) hypertension: Secondary | ICD-10-CM | POA: Diagnosis not present

## 2018-06-15 DIAGNOSIS — I420 Dilated cardiomyopathy: Secondary | ICD-10-CM | POA: Diagnosis not present

## 2018-06-15 MED ORDER — SACUBITRIL-VALSARTAN 24-26 MG PO TABS: 1.0000 | ORAL_TABLET | Freq: Two times a day (BID) | ORAL | 5 refills | Status: DC

## 2018-06-15 MED ORDER — SPIRONOLACTONE 25 MG PO TABS: 25.0000 mg | ORAL_TABLET | Freq: Every day | ORAL | 1 refills | Status: DC

## 2018-06-15 MED ORDER — CARVEDILOL 12.5 MG PO TABS: 12.5000 mg | ORAL_TABLET | Freq: Two times a day (BID) | ORAL | 1 refills | Status: DC

## 2018-06-15 MED ORDER — FUROSEMIDE 40 MG PO TABS: 40.0000 mg | ORAL_TABLET | Freq: Every day | ORAL | 1 refills | Status: DC

## 2018-06-15 NOTE — Patient Instructions (Signed)
Medication Instructions:  NO CHANGES- Your physician recommends that you continue on your current medications as directed. Please refer to the Current Medication list given to you today.  If you need a refill on your cardiac medications before your next appointment, please call your pharmacy.  Labwork: BMET WITH YOUR OTHER LAB WORK FROM YOUR PORT  PLEASE FAX RESULTS TO [phone] OUR PHONE # [phone]  Follow-Up: You will need a follow up appointment in 2 MONTHS.  You may see  DR Ellyn Hack or one of the following Advanced Practice Providers on your designated Care Team:   . Jory Sims, DNP, ANP . Rosaria Ferries, PA-C  At [facility], you and your health needs are our priority.  As part of our continuing mission to provide you with exceptional heart care, we have created designated Provider Care Teams.  These Care Teams include your primary Cardiologist (physician) and Advanced Practice Providers (APPs -  Physician Assistants and Nurse Practitioners) who all work together to provide you with the care you need, when you need it.  Thank you for choosing [facility]!!

## 2018-06-16 NOTE — Progress Notes (Signed)
Kristin Ward is a 62 y.o. female who presents for annual wellness visit and follow-up on chronic medical conditions.  She has the following concerns:  Denies any concerns today. Reports feeling good.   HTN- she was off her BP medications for a short period of time but reports good daily compliance now. She also reports eating a low sodium diet. No concerns with her BP or medication regimen. Being closely followed by her cardiologist.   Paresthesias improved since being on gabapentin and vitamin B vitamins.  She has 2 children who are helping her out.  She is not working, in the past she worked as a Marine scientist.   States she needs a Tdap but declines flu shot.   Her mood is good.  Requests that if she needs blood work to get a prescription so that she can take this to the cancer center for them to use her Port a Cath.   Mixed HL- states she has never been on a statin. Consider this today. She has history of aorta calcifications.    There is no immunization history on file for this patient. Last Pap smear: august 2019 recall in 2021  Last mammogram: 06/01/18 Last colonoscopy: 06/09/18. Recall for 5 years  Last DEXA: never Dentist: none Ophtho: Dr. Katy Fitch. UTD Exercise: swimming 3 days a week. Zumba, line dancing   Other doctors caring for patient include: Dr. Ellyn Hack and Jory Sims- Cardiology Dr. Earle Gell- GI Dr. Burr Medico- oncology Dr. Hulan Fray- OBGYN Dr. Zadie Rhine- retina specialist Dr. Katy Fitch- eyes  Depression screen:  See questionnaire below.  Depression screen [facility] 2/9 06/17/2018 11/06/2017 04/07/2017 03/20/2017  Decreased Interest 0 0 0 0  Down, Depressed, Hopeless 0 0 0 0  PHQ - 2 Score 0 0 0 0    Fall Risk Screen: see questionnaire below. Fall Risk  06/17/2018 01/26/2018 11/06/2017 04/07/2017 03/20/2017  Falls in the past year? 0 No No Yes Yes  Number falls in past yr: 0 - - 2 or more 2 or more  Injury with Fall? 0 - - Yes No  Risk for fall due to : - - - Impaired balance/gait  Impaired balance/gait    ADL screen:  See questionnaire below Functional Status Survey: Is the patient deaf or have difficulty hearing?: Yes(tested in august) Does the patient have difficulty seeing, even when wearing glasses/contacts?: Yes(vision impaired) Does the patient have difficulty concentrating, remembering, or making decisions?: No Does the patient have difficulty walking or climbing stairs?: No Does the patient have difficulty dressing or bathing?: No Does the patient have difficulty doing errands alone such as visiting a doctor's office or shopping?: No   End of Life Discussion:  Patient does not have a living will and medical power of attorney. MOST form filled out and she is a full code. Advance directive paperwork given to her to take home to her children.   Review of Systems Constitutional: -fever, -chills, -sweats, -unexpected weight change, -anorexia, -fatigue Allergy: -sneezing, -itching, -congestion Dermatology: denies changing moles, rash, lumps, new worrisome lesions ENT: -runny nose, -ear pain, -sore throat, -hoarseness, -sinus pain, -teeth pain, -tinnitus, -hearing loss, -epistaxis Cardiology:  -chest pain, -palpitations, -edema, -orthopnea, -paroxysmal nocturnal dyspnea Respiratory: -cough, -shortness of breath, -dyspnea on exertion, -wheezing, -hemoptysis Gastroenterology: -abdominal pain, -nausea, -vomiting, -diarrhea, -constipation, -blood in stool, -changes in bowel movement, -dysphagia Hematology: -bleeding or bruising problems Musculoskeletal: -arthralgias, -myalgias, -joint swelling, -back pain, -neck pain, -cramping, -gait changes Ophthalmology: -vision changes, -eye redness, -itching, -discharge Urology: -dysuria, -difficulty urinating, -hematuria, -urinary frequency, -  urgency, incontinence Neurology: -headache, -weakness, -tingling, -numbness, -speech abnormality, -memory loss, -falls, -dizziness Psychology:  -depressed mood, -agitation, -sleep  problems    PHYSICAL EXAM:  BP 130/80   Pulse 71   Ht 5' 7.5" (1.715 m)   Wt 243 lb (110.2 kg)   BMI 37.50 kg/m   General Appearance: Alert, cooperative, no distress, appears stated age Head: Normocephalic, without obvious abnormality, atraumatic Eyes: PERRL, conjunctiva/corneas clear Ears: Normal TM's and external ear canals Nose: Nares normal, mucosa normal, no drainage or sinus tenderness Throat: Lips, mucosa, and tongue normal; teeth and gums normal Neck: Supple, no lymphadenopathy; thyroid: no enlargement/tenderness/nodules; no carotid bruit or JVD Back: Spine nontender, no curvature, ROM normal, no CVA tenderness Lungs: Clear to auscultation bilaterally without wheezes, rales or ronchi; respirations unlabored Chest Wall: No tenderness or deformity Heart: Regular rate and rhythm, no murmur, rub or gallop Breast Exam: OB/GYN Abdomen: Soft, non-tender, nondistended, normoactive bowel sounds, no masses, no hepatosplenomegaly Genitalia: OB/GYN  Extremities: No clubbing, cyanosis or edema Pulses: 2+ and symmetric all extremities Skin: Skin color, texture, turgor normal, no rashes or lesions Lymph nodes: Cervical, supraclavicular, and axillary nodes normal Neurologic: CNII-XII intact, normal strength, sensation and gait; reflexes 2+ and symmetric throughout Psych: Normal mood, affect, hygiene and grooming.  ASSESSMENT/PLAN: Medicare annual wellness visit, subsequent  Essential hypertension  Chronic combined systolic and diastolic heart failure ([place])  Retinitis pigmentosa  Mixed hyperlipidemia  Need for diphtheria-tetanus-pertussis (Tdap) vaccine  Influenza vaccination declined  Advance directive discussed with patient  Routine general medical examination at a health care facility  Coronary artery calcification seen on CAT scan  Obesity (BMI 30-39.9)  She appears to be doing well overall.  HTN- BP is at goal range today since being back on her medication.  Reports low sodium diet. She is exercising almost daily.  Closely followed by cardiology for CHF, HTN, cardiomyopathy.  HL- will check lipids (a prescription was given for her to take to have blood drawn from her [facility] a Cath per request) Discussed that she would benefit from a statin. Unclear as to why she has never been on one but she does not recall statin history at all.  UTD with mammogram, Pap smear, colonoscopy.  Declines flu shot. Tdap prescription given.  Advance directive counseling done. MOST form filled out. She will call or return if she has questions regarding the others.  She rides [employer] and has not difficulties caring for herself.  Counseling on safety.  Follow up in 6 months or sooner pending lipids and starting on statin.     Discussed monthly self breast exams and yearly mammograms; at least 30 minutes of aerobic activity at least 5 days/week and weight-bearing exercise 2x/week; proper sunscreen use reviewed; healthy diet, including goals of calcium and vitamin D intake and alcohol recommendations (less than or equal to 1 drink/day) reviewed; regular seatbelt use; changing batteries in smoke detectors.  Immunization recommendations discussed.  Colonoscopy recommendations reviewed   Medicare Attestation I have personally reviewed: The patient's medical and social history Their use of alcohol, tobacco or illicit drugs Their current medications and supplements The patient's functional ability including ADLs,fall risks, home safety risks, cognitive, and hearing and visual impairment Diet and physical activities Evidence for depression or mood disorders  The patient's weight, height, and BMI have been recorded in the chart.  I have made referrals, counseling, and provided education to the patient based on review of the above and I have provided the patient with a written personalized care plan  for preventive services.     Harland Dingwall, NP-C   06/17/2018

## 2018-06-17 ENCOUNTER — Encounter: Payer: Self-pay | Admitting: Family Medicine

## 2018-06-17 ENCOUNTER — Ambulatory Visit (INDEPENDENT_AMBULATORY_CARE_PROVIDER_SITE_OTHER): Payer: Medicare HMO | Admitting: Family Medicine

## 2018-06-17 VITALS — BP 130/80 | HR 71 | Ht 67.5 in | Wt 243.0 lb

## 2018-06-17 DIAGNOSIS — H3552 Pigmentary retinal dystrophy: Secondary | ICD-10-CM

## 2018-06-17 DIAGNOSIS — Z2821 Immunization not carried out because of patient refusal: Secondary | ICD-10-CM

## 2018-06-17 DIAGNOSIS — I10 Essential (primary) hypertension: Secondary | ICD-10-CM

## 2018-06-17 DIAGNOSIS — Z7189 Other specified counseling: Secondary | ICD-10-CM | POA: Diagnosis not present

## 2018-06-17 DIAGNOSIS — I2510 Atherosclerotic heart disease of native coronary artery without angina pectoris: Secondary | ICD-10-CM

## 2018-06-17 DIAGNOSIS — I5042 Chronic combined systolic (congestive) and diastolic (congestive) heart failure: Secondary | ICD-10-CM | POA: Diagnosis not present

## 2018-06-17 DIAGNOSIS — E669 Obesity, unspecified: Secondary | ICD-10-CM

## 2018-06-17 DIAGNOSIS — Z0000 Encounter for general adult medical examination without abnormal findings: Secondary | ICD-10-CM

## 2018-06-17 DIAGNOSIS — Z23 Encounter for immunization: Secondary | ICD-10-CM

## 2018-06-17 DIAGNOSIS — E782 Mixed hyperlipidemia: Secondary | ICD-10-CM | POA: Diagnosis not present

## 2018-06-17 DIAGNOSIS — E785 Hyperlipidemia, unspecified: Secondary | ICD-10-CM | POA: Insufficient documentation

## 2018-06-17 HISTORY — DX: Mixed hyperlipidemia: E78.2

## 2018-06-17 LAB — POCT URINALYSIS DIP (PROADVANTAGE DEVICE)
BILIRUBIN UA: NEGATIVE mg/dL
Bilirubin, UA: NEGATIVE
Blood, UA: NEGATIVE
Glucose, UA: NEGATIVE mg/dL
LEUKOCYTES UA: NEGATIVE
Nitrite, UA: NEGATIVE
Protein Ur, POC: NEGATIVE mg/dL
SPECIFIC GRAVITY, URINE: 1.025
pH, UA: 6 (ref 5.0–8.0)

## 2018-06-17 NOTE — Addendum Note (Signed)
Addended by: Minette Headland A on: 06/17/2018 10:08 AM   Modules accepted: Orders

## 2018-06-19 ENCOUNTER — Encounter: Payer: Self-pay | Admitting: Gastroenterology

## 2018-07-31 ENCOUNTER — Inpatient Hospital Stay: Payer: Medicare HMO

## 2018-07-31 ENCOUNTER — Inpatient Hospital Stay (HOSPITAL_BASED_OUTPATIENT_CLINIC_OR_DEPARTMENT_OTHER): Payer: Medicare HMO | Admitting: Hematology

## 2018-07-31 ENCOUNTER — Inpatient Hospital Stay: Payer: Medicare HMO | Attending: Hematology

## 2018-07-31 ENCOUNTER — Encounter: Payer: Self-pay | Admitting: Hematology

## 2018-07-31 VITALS — BP 134/83 | HR 69 | Temp 97.9°F | Resp 18 | Ht 67.0 in | Wt 245.4 lb

## 2018-07-31 DIAGNOSIS — I10 Essential (primary) hypertension: Secondary | ICD-10-CM

## 2018-07-31 DIAGNOSIS — Z79899 Other long term (current) drug therapy: Secondary | ICD-10-CM | POA: Diagnosis not present

## 2018-07-31 DIAGNOSIS — Z923 Personal history of irradiation: Secondary | ICD-10-CM | POA: Diagnosis not present

## 2018-07-31 DIAGNOSIS — Z9221 Personal history of antineoplastic chemotherapy: Secondary | ICD-10-CM | POA: Diagnosis not present

## 2018-07-31 DIAGNOSIS — Z171 Estrogen receptor negative status [ER-]: Secondary | ICD-10-CM | POA: Insufficient documentation

## 2018-07-31 DIAGNOSIS — J449 Chronic obstructive pulmonary disease, unspecified: Secondary | ICD-10-CM | POA: Diagnosis not present

## 2018-07-31 DIAGNOSIS — C50512 Malignant neoplasm of lower-outer quadrant of left female breast: Secondary | ICD-10-CM | POA: Insufficient documentation

## 2018-07-31 DIAGNOSIS — Z7982 Long term (current) use of aspirin: Secondary | ICD-10-CM | POA: Diagnosis not present

## 2018-07-31 DIAGNOSIS — K5900 Constipation, unspecified: Secondary | ICD-10-CM

## 2018-07-31 DIAGNOSIS — Z95828 Presence of other vascular implants and grafts: Secondary | ICD-10-CM

## 2018-07-31 DIAGNOSIS — I5042 Chronic combined systolic (congestive) and diastolic (congestive) heart failure: Secondary | ICD-10-CM

## 2018-07-31 LAB — COMPREHENSIVE METABOLIC PANEL
ALK PHOS: 112 U/L (ref 38–126)
ALT: 30 U/L (ref 0–44)
ANION GAP: 7 (ref 5–15)
AST: 24 U/L (ref 15–41)
Albumin: 3.8 g/dL (ref 3.5–5.0)
BUN: 15 mg/dL (ref 6–20)
CALCIUM: 9.3 mg/dL (ref 8.9–10.3)
CO2: 26 mmol/L (ref 22–32)
Chloride: 108 mmol/L (ref 98–111)
Creatinine, Ser: 0.84 mg/dL (ref 0.44–1.00)
GFR calc Af Amer: >60 mL/min (ref >60)
Glucose, Bld: 105 mg/dL — ABNORMAL HIGH (ref 70–99)
POTASSIUM: 3.9 mmol/L (ref 3.5–5.1)
Sodium: 141 mmol/L (ref 135–145)
TOTAL PROTEIN: 7.5 g/dL (ref 6.5–8.1)
Total Bilirubin: 0.5 mg/dL (ref 0.3–1.2)

## 2018-07-31 LAB — CBC WITH DIFFERENTIAL/PLATELET
Abs Immature Granulocytes: 0.02 10*3/uL (ref 0.00–0.07)
BASOS PCT: 0 %
Basophils Absolute: 0 10*3/uL (ref 0.0–0.1)
EOS ABS: 0.2 10*3/uL (ref 0.0–0.5)
Eosinophils Relative: 4 %
HCT: 38.2 % (ref 36.0–46.0)
Hemoglobin: 12.5 g/dL (ref 12.0–15.0)
Immature Granulocytes: 0 %
Lymphocytes Relative: 29 %
Lymphs Abs: 1.8 10*3/uL (ref 0.7–4.0)
MCH: 29.1 pg (ref 26.0–34.0)
MCHC: 32.7 g/dL (ref 30.0–36.0)
MCV: 89 fL (ref 80.0–100.0)
MONO ABS: 0.4 10*3/uL (ref 0.1–1.0)
MONOS PCT: 7 %
NEUTROS PCT: 60 %
Neutro Abs: 3.7 10*3/uL (ref 1.7–7.7)
Platelets: 270 10*3/uL (ref 150–400)
RBC: 4.29 MIL/uL (ref 3.87–5.11)
RDW: 13.6 % (ref 11.5–15.5)
WBC: 6.1 10*3/uL (ref 4.0–10.5)
nRBC: 0 % (ref 0.0–0.2)

## 2018-07-31 MED ORDER — SODIUM CHLORIDE 0.9% FLUSH
10.0000 mL | INTRAVENOUS | Status: DC | PRN
  Administered 2018-07-31: 10 mL via INTRAVENOUS
  Filled 2018-07-31: qty 10

## 2018-07-31 MED ORDER — HEPARIN SOD (PORK) LOCK FLUSH 100 UNIT/ML IV SOLN
500.0000 [IU] | Freq: Once | INTRAVENOUS | Status: AC | PRN
  Administered 2018-07-31: 500 [IU] via INTRAVENOUS
  Filled 2018-07-31: qty 5

## 2018-07-31 NOTE — Progress Notes (Signed)
Kristin Ward   [phone] [phone]   Clinic Follow up Note   Patient Care Team: Girtha Rm, NP-C as PCP - General (Family Medicine) [name], MD as PCP - Cardiology (Cardiology) [name], MD as Consulting Physician (Hematology) [name], MD as Consulting Physician (General Surgery)  Date of Service:  07/31/2018  CHIEF COMPLAINT: f/u of left breast cancer, triple negative   SUMMARY OF ONCOLOGIC HISTORY: Oncology History   Cancer Staging Malignant neoplasm of lower-outer quadrant of left breast of female, estrogen receptor negative ([place]) Staging form: Breast, AJCC 8th Edition - Clinical stage from 06/06/2017: Stage IB (cT1c, cN0, cM0, G3, ER: Negative, PR: Negative, HER2: Negative) - Signed by [name], MD on 06/15/2017 - Pathologic stage from 06/26/2017: Stage IB (pT1c, pN0, cM0, G3, ER: Negative, PR: Negative, HER2: Negative) - Signed by [name], MD on 07/10/2017       Malignant neoplasm of lower-outer quadrant of left breast of female, estrogen receptor negative ([place])   05/30/2017 Mammogram    Diagnostic mammo and US IMPRESSION: 1. Highly suspicious 1.4 cm mass in the slightly lower slightly outer left breast -tissue sampling recommended. 2. Indeterminate 0.5 mm mass in the slightly lower slightly outer left breast -tissue sampling recommended. 3. At least 2 left axillary lymph nodes with borderline cortical thickness.    06/06/2017 Initial Biopsy    Diagnosis 1. Breast, left, needle core biopsy, 5:30 o'clock - INVASIVE DUCTAL CARCINOMA, G3 2. Lymph node, needle/core biopsy, left axillary - NO CARCINOMA IDENTIFIED IN ONE LYMPH NODE (0/1)    06/06/2017 Initial Diagnosis    Malignant neoplasm of lower-outer quadrant of left breast of female, estrogen receptor negative ([place])    06/06/2017 Receptors her2    Estrogen Receptor: 0%, NEGATIVE Progesterone Receptor: 0%, NEGATIVE Proliferation Marker Ki67: 70%    06/26/2017 Surgery    LEFT BREAST LUMPECTOMY WITH RADIOACTIVE SEED AND SENTINEL LYMPH NODE BIOPSY ERAS  PATHWAY AND INSERTION PORT-A-CATH By Dr. Barry Dienes on 06/26/17     06/26/2017 Pathology Results    Diagnosis 06/26/17  1. Breast, lumpectomy, Left - INVASIVE DUCTAL CARCINOMA, GRADE III/III, SPANNING 1.2 CM. - THE SURGICAL RESECTION MARGINS ARE NEGATIVE FOR CARCINOMA. - SEE ONCOLOGY TABLE BELOW. 2. Lymph node, sentinel, biopsy, Left axillary #1 - THERE IS NO EVIDENCE OF CARCINOMA IN 1 OF 1 LYMPH NODE (0/1). 3. Lymph node, sentinel, biopsy, Left axillary #2 - THERE IS NO EVIDENCE OF CARCINOMA IN 1 OF 1 LYMPH NODE (0/1). 4. Lymph node, sentinel, biopsy, Left axillary #3 - THERE IS NO EVIDENCE OF CARCINOMA IN 1 OF 1 LYMPH NODE (0/1).     07/25/2017 - 09/29/2017 Chemotherapy    Adjuvant cytoxan and docetaxel (TC) every 3 weekd for 4 cycles      11/19/2017 - 12/17/2017 Radiation Therapy    Adjuvant breast radiation Left breast treated to 42.5 Gy with 17 fx of 2.5 Gy followed by a boost of 7.5 Gy with 3 fx of 2.5 Gy        CURRENT THERAPY:  Surveillance  INTERVAL HISTORY:  Kristin Ward is here for a follow up of left breast cancer. She was last seen by me 6 months ago. She presents to the clinic today with her daughter. She notes no new major changes. She notes a left breast seroma which she can feel. She also notes one in her left axilla near incision which she has soreness with. This has been tolerable.  She notes she went to cardiologist  who said she was full of fluid. She was treated with Lasix and her fluid retention reduced. She notes very slight dizziness upon standing. She will try to get up slower.  She notes back and hip pain when she gets constipated. When she strains to pass stool she will bleed near her rectum. She has taken Miralax and [place] with no adequate relief. She was last seen by Dr. Silverio Decamp who did her recent colonoscopy and notes internal and external  hemorrhoids. Her constipation is exacerbated with meat, she is willing to change her diet.      REVIEW OF SYSTEMS:   Constitutional: Denies fevers, chills or abnormal weight loss Eyes: Denies blurriness of vision Ears, nose, mouth, throat, and face: Denies mucositis or sore throat Respiratory: Denies cough, dyspnea or wheezes Cardiovascular: Denies palpitation, chest discomfort or lower extremity swelling Gastrointestinal:  Denies nausea, heartburn or change in bowel habits (+) constipation with back/hip pain and bleeding.  Skin: Denies abnormal skin rashes Lymphatics: Denies new lymphadenopathy or easy bruising Neurological:Denies numbness, tingling or new weaknesses Behavioral/Psych: Mood is stable, no new changes  BREAST: (+) left breast seroma, palpable (+) left axillary soreness All other systems were reviewed with the patient and are negative.  MEDICAL HISTORY:  Past Medical History:  Diagnosis Date  . Abnormal myocardial perfusion study 03/15/2016   EF 15%, severe LV systolic dysfcn, global hypokinesis and inferior akinesis. dilated LV  . Abnormal x-ray of lungs with single pulmonary nodule 03/15/2016   per records from [place]   . Abnormal x-ray of lungs with single pulmonary nodule 03/15/2016   per records from [place]   . Allergy   . Anemia   . Asthma   . Blind   . CHF (congestive heart failure) ([place])   . Cholelithiasis 05/19/2017   On CT  . COPD (chronic obstructive pulmonary disease) ([place])    PT DENIES  . Coronary artery calcification seen on CAT scan 05/19/2017  . 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 Diagnosed in [facility]  . Family history of colon cancer   . Genetic testing 06/19/2017   Multi-Cancer panel (83 genes) @ [facility] - No pathogenic mutations detected  . Headache    NONE RECENT  . Hypertension   . Malignant neoplasm of lower-outer quadrant of left breast of female, estrogen receptor negative ([place])  06/11/2017   left breast  . Mixed hyperlipidemia 06/17/2018  . Personal history of chemotherapy 2018-2019  . Personal history of radiation therapy 2019  . Retinitis pigmentosa   . Solitary pulmonary nodule on lung CT 03/16/2016   per records from [place]  . Uveitis     SURGICAL HISTORY: Past Surgical History:  Procedure Laterality Date  . brain cyst removed  2007   to help with headaches per patient , aspirated   . BREAST BIOPSY Left 06/06/2017   x2  . BREAST CYST ASPIRATION Left 06/06/2017  . BREAST LUMPECTOMY Left 06/26/2017  . BREAST LUMPECTOMY WITH RADIOACTIVE SEED AND SENTINEL LYMPH NODE BIOPSY Left 06/26/2017   Procedure: BREAST LUMPECTOMY WITH RADIOACTIVE SEED AND SENTINEL LYMPH NODE BIOPSY ERAS  PATHWAY;  Surgeon: [name], MD;  Location: [place];  Service: General;  Laterality: Left;  pec block  . NASAL TURBINATE REDUCTION    . PORTACATH PLACEMENT N/A 06/26/2017   Procedure: INSERTION PORT-A-CATH;  Surgeon: [name], MD;  Location: [place];  Service: General;  Laterality: N/A;  . TRANSTHORACIC ECHOCARDIOGRAM  12/2015   [facility] May 2017: Mild concentric LVH.  Global hypokinesis. GR daily. EF 18% severe LA dilation. Mitral annular dilatation with papillary muscle dysfunction and moderate MR. Dilated IVC consistent with elevated RAP.    I have reviewed the social history and family history with the patient and they are unchanged from previous note.  ALLERGIES:  is allergic to latex; tylenol with codeine #3 [acetaminophen-codeine]; and penicillins.  MEDICATIONS:  Current Outpatient Medications  Medication Sig Dispense Refill  . aspirin 81 MG chewable tablet Chew 81 mg by mouth daily.    . carvedilol (COREG) 12.5 MG tablet Take 1 tablet (12.5 mg total) by mouth 2 (two) times daily with a meal. 180 tablet 1  . furosemide (LASIX) 40 MG tablet Take 1 tablet (40 mg total) by mouth daily. 92 tablet 1  . gabapentin (NEURONTIN) 300 MG capsule Take 1 capsule (300  mg total) by mouth 3 (three) times daily. 90 capsule 1  . lidocaine-prilocaine (EMLA) cream Apply to affected area once 30 g 3  . naproxen (NAPROSYN) 500 MG tablet Take 1 tablet (500 mg total) by mouth daily as needed for mild pain. 20 tablet 1  . sacubitril-valsartan (ENTRESTO) 24-26 MG Take 1 tablet by mouth 2 (two) times daily. 60 tablet 5  . spironolactone (ALDACTONE) 25 MG tablet Take 1 tablet (25 mg total) by mouth daily. 90 tablet 1  . vitamin C (ASCORBIC ACID) 500 MG tablet Take 1,000 mg 2 (two) times daily by mouth.     No current facility-administered medications for this visit.     PHYSICAL EXAMINATION: ECOG PERFORMANCE STATUS: 1 - Symptomatic but completely ambulatory  Vitals:   07/31/18 1136  BP: 134/83  Pulse: 69  Resp: 18  Temp: 97.9 F (36.6 C)  SpO2: 97%   Filed Weights   07/31/18 1136  Weight: 245 lb 6.4 oz (111.3 kg)    GENERAL:alert, no distress and comfortable SKIN: skin color, texture, turgor are normal, no rashes or significant lesions EYES: normal, Conjunctiva are pink and non-injected, sclera clear OROPHARYNX:no exudate, no erythema and lips, buccal mucosa, and tongue normal  NECK: supple, thyroid normal size, non-tender, without nodularity LYMPH:  no palpable lymphadenopathy in the cervical, axillary or inguinal LUNGS: clear to auscultation and percussion with normal breathing effort HEART: regular rate & rhythm and no murmurs and no lower extremity edema ABDOMEN:abdomen soft, non-tender and normal bowel sounds Musculoskeletal:no cyanosis of digits and no clubbing  NEURO: alert & oriented x 3 with fluent speech, no focal motor/sensory deficits BREAST: S/p left breast lumpectomy: Surgical incisions healed well. 5cm Seroma of left breast under incision    LABORATORY DATA:  I have reviewed the data as listed CBC Latest Ref Rng & Units 07/31/2018 06/02/2018 03/16/2018  WBC 4.0 - 10.5 K/uL 6.1 4.8 4.8  Hemoglobin 12.0 - 15.0 g/dL 12.5 12.5 11.7    Hematocrit 36.0 - 46.0 % 38.2 38.9 35.0  Platelets 150 - 400 K/uL 270 272 277     CMP Latest Ref Rng & Units 07/31/2018 06/02/2018 03/16/2018  Glucose 70 - 99 mg/dL 105(H) 114(H) 122(H)  BUN 6 - 20 mg/dL _0 Creatinine 0.44 - 1.00 mg/dL 0.84 0.86 0.91  Sodium 135 - 145 mmol/L 141 142 142  Potassium 3.5 - 5.1 mmol/L 3.9 4.2 3.7  Chloride 98 - 111 mmol/L 108 108 108  CO2 22 - 32 mmol/L _1 Calcium 8.9 - 10.3 mg/dL 9.3 9.7 9.6  Total Protein 6.5 - 8.1 g/dL 7.5 7.8 7.8  Total Bilirubin 0.3 - 1.2  mg/dL 0.5 0.5 0.6  Alkaline Phos 38 - 126 U/L 112 119 112  AST 15 - 41 U/L _0 ALT 0 - 44 U/L 30 45(H) 16      RADIOGRAPHIC STUDIES: I have personally reviewed the radiological images as listed and agreed with the findings in the report. No results found.   ASSESSMENT & PLAN:  Kristin Ward is a 62 y.o. female with a history of    1. 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 She has high risk for recurrence due to triple negative disease. She is s/p left breast lumpectomy, adjuvant TC and Radiation.  -She is clinically doing well. Lab reviewed, her CBC and CMP are within normal limits with glucose at 105.Marland Kitchen Her physical exam and her 05/2018 mammogram were unremarkable, except left breast seroma. There is no clinical concern for recurrence. -I discussed if her seroma grows and becomes painful, she can see her breast surgeon for aspiration, otherwise she can monitor it as it may decrease over time.  -She is fine to get her PAC removed by Dr. Barry Dienes.   -Continue breast cancer surveillance. Continue annual diagnostic mammogram, she will be due in October 2020 -f/u in 6 months   2. Genetics -Results were negative for any gene mutations.   3. Chronic combined systolic and diastolic heart failure, EF 45-50% -Follow-up with her cardiologist Dr.  Ellyn Hack  -She is on lasix and spironolactone.  -I encouraged her to stand up slower due to very mild dizziness upon standing.   4. HTN, COPD, retinitis pigmentosa with limited vision -Continue to follow-up with primary care physician and cardiologist  -She is legally blind -BP normal today (07/31/18)  5. Constipation -Presents with back/hip pain and bleeding with straining -She has tried Miralax and Senna with no relief -I encouraged her follow up with Dr. Silverio Decamp.   PLAN: -She is clinically doing well, continue surveillance  -Lab and f/u in 6 months  -Port flush in 4-5 weeks, pt plan to remove it in Feb 2020. I sent a message to Dr. Barry Dienes    No problem-specific Assessment & Plan notes found for this encounter.   No orders of the defined types were placed in this encounter.  All questions were answered. The patient knows to call the clinic with any problems, questions or concerns. No barriers to learning was detected.  I spent 20 minutes counseling the patient face to face. The total time spent in the appointment was 25 minutes and more than 50% was on counseling and review of test results     [name], MD 07/31/2018   I, Joslyn Devon, am acting as scribe for [name], MD.   I have reviewed the above documentation for accuracy and completeness, and I agree with the above.

## 2018-08-13 ENCOUNTER — Other Ambulatory Visit: Payer: Self-pay | Admitting: General Surgery

## 2018-08-20 ENCOUNTER — Ambulatory Visit (INDEPENDENT_AMBULATORY_CARE_PROVIDER_SITE_OTHER): Payer: Medicare HMO | Admitting: Cardiology

## 2018-08-20 ENCOUNTER — Encounter: Payer: Self-pay | Admitting: Cardiology

## 2018-08-20 VITALS — BP 137/87 | HR 69 | Ht 67.0 in | Wt 247.4 lb

## 2018-08-20 DIAGNOSIS — R9439 Abnormal result of other cardiovascular function study: Secondary | ICD-10-CM | POA: Diagnosis not present

## 2018-08-20 DIAGNOSIS — I5042 Chronic combined systolic (congestive) and diastolic (congestive) heart failure: Secondary | ICD-10-CM | POA: Diagnosis not present

## 2018-08-20 DIAGNOSIS — E782 Mixed hyperlipidemia: Secondary | ICD-10-CM

## 2018-08-20 DIAGNOSIS — I10 Essential (primary) hypertension: Secondary | ICD-10-CM | POA: Diagnosis not present

## 2018-08-20 DIAGNOSIS — E669 Obesity, unspecified: Secondary | ICD-10-CM

## 2018-08-20 DIAGNOSIS — I2510 Atherosclerotic heart disease of native coronary artery without angina pectoris: Secondary | ICD-10-CM | POA: Diagnosis not present

## 2018-08-20 DIAGNOSIS — I420 Dilated cardiomyopathy: Secondary | ICD-10-CM | POA: Diagnosis not present

## 2018-08-20 MED ORDER — SACUBITRIL-VALSARTAN 49-51 MG PO TABS: 1.0000 | ORAL_TABLET | Freq: Two times a day (BID) | ORAL | 6 refills | Status: DC

## 2018-08-20 MED ORDER — FUROSEMIDE 40 MG PO TABS: ORAL_TABLET | ORAL | 3 refills | Status: DC

## 2018-08-20 MED ORDER — POTASSIUM CHLORIDE CRYS ER 20 MEQ PO TBCR: EXTENDED_RELEASE_TABLET | ORAL | 3 refills | Status: DC

## 2018-08-20 NOTE — Patient Instructions (Addendum)
Medication Instructions:    YOU DRY WEIGHT SHOULD BE 240 LBS   MAY TAKE LASIX ( FUROSEMIDE) 1 TO 2 TABLET  IF NEED FOR INCREASE WEIGHT OVERNIGHT   MAY  USE 80 MG OF LASIX EACH DAY UP TO  3 DAYSAT ONE TIME AT  . WHEN YOU USE EXTRA DOS OF LASIX  TAKE 20 MEQ OF POTASSIUM WITH THE DOSE.   -- INCREASE DOSE OF ENTERSTO 49/51 MG ONE TABLET TWICE A DAY   -- Sliding scale Lasix: Weigh yourself when you get home, then Daily in the Morning. Your dry weight will be what your scale says on the day you return home.(here is  240 lbs.).   If you gain more than 3 pounds from dry weight: Increase the Lasix dosing to 80 mg in the morning and take 20 meq of potassium  until weight returns to baseline dry weight.  If weight gain is greater than 5 pounds in 2 days: Increased to Lasix 80 mg in the morning and 40 mg in the evening , and take 20 meq potassium  and contact the office for further assistance if weight does not go down the next day.  If the weight goes down more than 3 pounds from dry weight: Hold Lasix until it returns to baseline dry weight  If you need a refill on your cardiac medications before your next appointment, please call your pharmacy.   Lab work: NOT NEEDED If you have labs (blood work) drawn today and your tests are completely normal, you will receive your results only by: Marland Kitchen MyChart Message (if you have MyChart) OR . A paper copy in the mail If you have any lab test that is abnormal or we need to change your treatment, we will call you to review the results.  Testing/Procedures: NOT NEEDED  Follow-Up: At [facility], you and your health needs are our priority.  As part of our continuing mission to provide you with exceptional heart care, we have created designated Provider Care Teams.  These Care Teams include your primary Cardiologist (physician) and Advanced Practice Providers (APPs -  Physician Assistants and Nurse Practitioners) who all work together to provide you with the  care you need, when you need it. . Your physician recommends that you schedule a follow-up appointment in [place] (MAY 2020) .  Marland Kitchen Your physician recommends that you schedule a follow-up appointment in [place] ( AUG 2020) .   Any Other Special Instructions Will Be Listed Below (If Applicable).  Gradual Increase activity to lose weight

## 2018-08-20 NOTE — Progress Notes (Signed)
PCP: Girtha Rm, NP-C Girtha Rm, NP-C as PCP - General (Family Medicine) [name], MD (& Jory Sims, NP)- Cardiology (Cardiology) [name], MD as Consulting Physician (Hematology) [name], MD as Consulting Physician (General Surgery)  Clinic Note: Chief Complaint  Patient presents with  . Follow-up    6-week follow-up from recent adjustment of medications and increasing of diuretic  . Congestive Heart Failure    Mild exacerbation  . Cardiomyopathy    Ischemic, likely related to chemotherapy    HPI: Francile Woolford is a 63 y.o. female with a PMH notable for breast cancer treated with Cytoxan and Docetaxel & Adjuvant XRT and chemotherapy related cardiomyopathy who presents today for 14-month follow-up of "heart failure "  Dannon Perlow Chasse was last seen by me in September 2018.  Was able to do her daily activities without any difficulty.  Able to walk 2 miles at a time with her daughter.  Does not walk fast, but able to walk without too much difficulty.  No exertional dyspnea.  No PND orthopnea.  No chest pain or pressure.  She was seen by Jory Sims, NP on June 15, 2018 in follow-up for recent exacerbation of heart failure with a weight gain of 12 pounds over (apparently she been out of her medications for couple weeks).  She was told to increase her Lasix to 40 twice daily for 2 days and then start 40 mg daily.  Was having issues with her insurance company getting her medications filled.  Medications were restarted.  Told to avoid NSAIDs.  Recent Hospitalizations:  No hospitalizations, but is still followed at the oncology clinic for her left-sided breast cancer.  Has Port-A-Cath still in place.  She has had a history of cardiomyopathy related to her chemotherapy --as a result, she had an echocardiogram done in October 2018 and then again in January 2019. -->  Has a left-sided chest seroma that needs to be drained.  Studies Personally  Reviewed - (if available, images/films reviewed: From Epic Chart or Care Everywhere)  2D Echo October 2018: Mild concentric LVH.  EF 45 to 50% with diffuse hypokinesis.  GR 1 DD.  Mild aortic root dilation.  2D Echo August 26, 2017: Mild LVH EF 45 to 50%.  Diffuse HK.  No significant valve disease.  No significant change.  Interval History: Juno presents here today again about 7 pounds up from what should be her dry weight.  She actually feels better now and less dyspneic.  She does have some fluid retention in her abdomen however.  She initially felt improvement with diuresis after seeing Jory Sims, NP however some of the fluid seems to have built back up again.  She has some mild amount of swelling and abdominal distention, and notes that she feels a little bit more short of breath than usual with exertion. Of late she may have noticed it a little more orthopnea and PND than she had had since she initially took extra doses of Lasix.  She never has any chest pain or pressure with rest or exertion.  She really was not sure how to take her Lasix after the initial intervention last fall, and has count of gotten out of the habit of taking additional doses.  Remaining cardiac review of symptoms: No palpitations, lightheadedness, dizziness, weakness or syncope/near syncope. No TIA/amaurosis fugax symptoms. No claudication.  ROS: A comprehensive was performed. Review of Systems  Constitutional: Negative for chills, fever, malaise/fatigue (Not really malaise fatigue  just not as much energy as she would like to have.) and weight loss (Having a hard time keeping the weight off.  Thinks some of this may have been related to holidays related eating.  Has gained weight.).  HENT: Negative for congestion and nosebleeds.   Respiratory: Negative for cough and shortness of breath (Only with significant exertion).   Cardiovascular: Positive for orthopnea and PND.  Gastrointestinal: Positive for  abdominal pain (A little bit increased abdominal girth from swelling). Negative for blood in stool and melena.  Genitourinary: Negative for hematuria.  Musculoskeletal: Negative for joint pain.  Neurological: Negative for dizziness and focal weakness.  Psychiatric/Behavioral: Negative for memory loss. The patient is not nervous/anxious and does not have insomnia.   All other systems reviewed and are negative.  I have reviewed and (if needed) personally updated the patient's problem list, medications, allergies, past medical and surgical history, social and family history.   Past Medical History:  Diagnosis Date  . Abnormal myocardial perfusion study 03/15/2016   EF 15%, severe LV systolic dysfcn, global hypokinesis and inferior akinesis. dilated LV  . Abnormal x-ray of lungs with single pulmonary nodule 03/15/2016   per records from [place]   . Abnormal x-ray of lungs with single pulmonary nodule 03/15/2016   per records from [place]   . Allergy   . Anemia   . Asthma   . Blind   . CHF (congestive heart failure) ([place])   . Cholelithiasis 05/19/2017   On CT  . COPD (chronic obstructive pulmonary disease) ([place])    PT DENIES  . Coronary artery calcification seen on CAT scan 05/19/2017  . 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 Diagnosed in [facility]  . Family history of colon cancer   . Genetic testing 06/19/2017   Multi-Cancer panel (83 genes) @ [facility] - No pathogenic mutations detected  . Headache    NONE RECENT  . Hypertension   . Malignant neoplasm of lower-outer quadrant of left breast of female, estrogen receptor negative ([place]) 06/11/2017   left breast  . Mixed hyperlipidemia 06/17/2018  . Personal history of chemotherapy 2018-2019  . Personal history of radiation therapy 2019  . Retinitis pigmentosa   . Solitary pulmonary nodule on lung CT 03/16/2016   per records from [place]  . Uveitis     Past Surgical History:  Procedure  Laterality Date  . brain cyst removed  2007   to help with headaches per patient , aspirated   . BREAST BIOPSY Left 06/06/2017   x2  . BREAST CYST ASPIRATION Left 06/06/2017  . BREAST LUMPECTOMY Left 06/26/2017  . BREAST LUMPECTOMY WITH RADIOACTIVE SEED AND SENTINEL LYMPH NODE BIOPSY Left 06/26/2017   Procedure: BREAST LUMPECTOMY WITH RADIOACTIVE SEED AND SENTINEL LYMPH NODE BIOPSY ERAS  PATHWAY;  Surgeon: [name], MD;  Location: [place];  Service: General;  Laterality: Left;  pec block  . NASAL TURBINATE REDUCTION    . PORTACATH PLACEMENT N/A 06/26/2017   Procedure: INSERTION PORT-A-CATH;  Surgeon: [name], MD;  Location: [place];  Service: General;  Laterality: N/A;  . TRANSTHORACIC ECHOCARDIOGRAM  12/2015   A) [facility] May 2017: Mild concentric LVH. Global hypokinesis. GR daily. EF 18% severe LA dilation. Mitral annular dilatation with papillary muscle dysfunction and moderate MR. Dilated IVC consistent with elevated RAP.  Marland Kitchen TRANSTHORACIC ECHOCARDIOGRAM  05/2017; 08/2017   a) Mild concentric LVH.  EF 45 to 50% with diffuse hypokinesis.  GR 1 DD.  Mild aortic root dilation.;;  b)  Mild LVH EF 45 to 50%.  Diffuse HK.  No significant valve disease.  No significant change.    Current Meds  Medication Sig  . aspirin 81 MG chewable tablet Chew 81 mg by mouth daily.  . carvedilol (COREG) 12.5 MG tablet Take 1 tablet (12.5 mg total) by mouth 2 (two) times daily with a meal.  . gabapentin (NEURONTIN) 300 MG capsule Take 1 capsule (300 mg total) by mouth 3 (three) times daily.  Marland Kitchen lidocaine-prilocaine (EMLA) cream Apply to affected area once  . naproxen (NAPROSYN) 500 MG tablet Take 1 tablet (500 mg total) by mouth daily as needed for mild pain.  Marland Kitchen spironolactone (ALDACTONE) 25 MG tablet Take 1 tablet (25 mg total) by mouth daily.  . vitamin C (ASCORBIC ACID) 500 MG tablet Take 1,000 mg 2 (two) times daily by mouth.  . [DISCONTINUED] furosemide (LASIX) 40 MG tablet Take 1 tablet  (40 mg total) by mouth daily.  . [DISCONTINUED] sacubitril-valsartan (ENTRESTO) 24-26 MG Take 1 tablet by mouth 2 (two) times daily.    Allergies  Allergen Reactions  . Latex Hives and Itching    Burning   . Tylenol With Codeine #3 [Acetaminophen-Codeine] Nausea And Vomiting  . Penicillins Nausea Only, Swelling and Rash    Has patient had a PCN reaction causing immediate rash, facial/tongue/throat swelling, SOB or lightheadedness with hypotension: Yes Has patient had a PCN reaction causing severe rash involving mucus membranes or skin necrosis: Yes Has patient had a PCN reaction that required hospitalization: No Has patient had a PCN reaction occurring within the last 10 years: No TONGUE SWELLING AND RASH AROUND MOUTH If all of the above answers are "NO", then may proceed with Cephalosporin use.     Social History   Tobacco Use  . Smoking status: Former Smoker    Packs/day: 0.25    Years: 23.00    Pack years: 5.75    Types: Cigarettes    Last attempt to quit: 10/09/2015    Years since quitting: 2.8  . Smokeless tobacco: Never Used  Substance Use Topics  . Alcohol use: No  . Drug use: No   Social History   Social History Narrative   She is a widowed mother of 2 (daughter and son).   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   She moved to [place] apparently back in 2016, but was visiting family in [place] and was diagnosed with Cardiomyopathy-EF 18%.   She is usually accompanied by her daughter.    At baseline, she walks roughly 1-2 miles a day.    family history includes Breast cancer (age of onset: 37) in an other family member; Colon cancer (age of onset: 86) in her mother; Fibromyalgia in her sister; Heart attack in her father; Pancreatic cancer in an other family member.  Wt Readings from Last 3 Encounters:  08/20/18 247 lb 6.4 oz (112.2 kg)  07/31/18 245 lb 6.4 oz (111.3 kg)  06/17/18 243 lb (110.2 kg)    PHYSICAL EXAM BP 137/87   Pulse 69   Ht 5\' 7"   (1.702 m)   Wt 247 lb 6.4 oz (112.2 kg)   BMI 38.75 kg/m  Physical Exam  Constitutional: She is oriented to person, place, and time. She appears well-developed and well-nourished. No distress.  Moderate -to-morbid obesity with risk factors.  HENT:  Head: Normocephalic and atraumatic.  Eyes:  Essentially blind.  Unable to make out most features unless they are right in front her.  Uses standard blind walking cane  Neck: Normal range of motion. Neck supple. Hepatojugular reflux and JVD (Roughly 8-10 cmH2O) present. Carotid bruit is not present.  Cardiovascular: Normal rate, regular rhythm, S1 normal, S2 normal, intact distal pulses and normal pulses.  Occasional extrasystoles are present. PMI is displaced (Difficult to palpate, but seems to be somewhat laterally displaced.). Exam reveals distant heart sounds. Exam reveals no gallop, no S4 and no friction rub.  Murmur heard.  Medium-pitched blowing decrescendo holosystolic murmur is present with a grade of 1/6 at the apex. Pulmonary/Chest: Effort normal and breath sounds normal. No respiratory distress. She has no wheezes. She has no rales.  Abdominal: Soft. Bowel sounds are normal. She exhibits no distension. There is no abdominal tenderness. There is no rebound.  Unable to assess HSM  Musculoskeletal:        General: Edema (1+ bilateral lower extremity) present.  Neurological: She is alert and oriented to person, place, and time.  Psychiatric: She has a normal mood and affect. Her behavior is normal. Judgment and thought content normal.  Vitals reviewed.   Adult ECG Report n/a  Other studies Reviewed: Additional studies/ records that were reviewed today include:  Recent Labs:   Lab Results  Component Value Date   CREATININE 0.84 07/31/2018   BUN 15 07/31/2018   NA 141 07/31/2018   K 3.9 07/31/2018   CL 108 07/31/2018   CO2 26 07/31/2018   Lab Results  Component Value Date   CHOL 195 03/20/2017   HDL 46 (L) 03/20/2017    LDLCALC 128 (H) 03/20/2017   TRIG 104 03/20/2017   CHOLHDL 4.2 03/20/2017    ASSESSMENT / PLAN: Problem List Items Addressed This Visit    Abnormal myocardial perfusion study (Chronic)    Once stabilized from a symptom standpoint, I think we probably need to consider ischemic evaluation in light of coronary calcification noted on CT scan.  Would consider ordering a Coronary CT angiogram at next visit with Jory Sims, NP.      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  Now just mildly reduced.  She still has some diastolic dysfunction though probably. Still has maybe class I and now class II symptoms with volume overload.  Her cardiomyopathy notably improved with Entresto and carvedilol.  She is also on stable dose of spironolactone.  Plan: With recent exacerbation of symptoms and weight gain of over 7 pounds from baseline, will need to further titrate medications and adjust her diuretic.  We also discussed sliding scale Lasix.   -- INCREASE DOSE OF ENTERSTO 49/51 MG ONE TABLET TWICE A DAY   -- DRY WEIGHT SHOULD BE 240 LBS --> increase Lasix to 80 mg daily for 3 days then alternate 40 mg 1 day and 80 mg 1 day until down to dry weight of roughly 240 pounds at home.  SLIDING SCALE LASIX RECOMMENDATIONS: MAY TAKE LASIX ( FUROSEMIDE) 1 TO 2 TABLET  IF NEED FOR INCREASE WEIGHT OVERNIGHT   MAY  USE 80 MG OF LASIX EACH DAY UP TO  3 DAYSAT ONE TIME AT  .  -WHEN YOU USE EXTRA DOSE OF LASIX  TAKE 20 MEQ OF POTASSIUM WITH THE DOSE.       Relevant Medications   sacubitril-valsartan (ENTRESTO) 49-51 MG   furosemide (LASIX) 40 MG tablet   Coronary artery calcification seen on CAT scan (Chronic)    For right now, in the absence  of active angina symptoms, would hold off on further ischemic evaluation, especially since her EF is improved.  May benefit from coronary CT angiogram.  She is on excellent  doses of medications but is not currently on statin. ->  Would recommend LDL less than at least 100 if not less than 70.      Relevant Medications   sacubitril-valsartan (ENTRESTO) 49-51 MG   furosemide (LASIX) 40 MG tablet   Dilated cardiomyopathy (HCC) (Chronic)    Follow-up echocardiogram showed relatively preserved EF now roughly 45%.  Reported grade 1 diastolic dysfunction but probably at least grade 1 if not 2 based on symptoms.  Now totally compensated class I to class II symptoms at worst. Is on excellent dose of carvedilol and spironolactone,  I am increasing dose of Entresto now I reiterated importance of sliding scale Lasix in addition to -short increased dose timeframe to return to baseline dry weight of roughly 240 pounds at home.      Relevant Medications   sacubitril-valsartan (ENTRESTO) 49-51 MG   furosemide (LASIX) 40 MG tablet   Essential hypertension (Chronic)   Relevant Medications   sacubitril-valsartan (ENTRESTO) 49-51 MG   furosemide (LASIX) 40 MG tablet   Mixed hyperlipidemia (Chronic)    At follow-up visit, we need to recheck lipids if had not been checked elsewhere.  Low threshold to consider adding statin.      Relevant Medications   sacubitril-valsartan (ENTRESTO) 49-51 MG   furosemide (LASIX) 40 MG tablet   Obesity (BMI 30-39.9) (Chronic)    Essentially this is borderline morbid obesity with her risk factors and BMI greater than 35.  Discussed the importance of weight loss, I think at least some of the weight gain that she has had from her baseline is related to her dietary discretion.  Hopefully, she will start to lose weight by adjusting her diet, and will need to adjust her dry weight accordingly.         I spent a total of 25 minutes with the patient and chart review. >  50% of the time was spent in direct patient consultation.   Current medicines are reviewed at length with the patient today.  (+/- concerns) - not sure how to take diuretics --  did not understand "Sliding Scale" The following changes have been made:  see below  Patient Instructions  Medication Instructions:    YOU DRY WEIGHT SHOULD BE 240 LBS   MAY TAKE LASIX ( FUROSEMIDE) 1 TO 2 TABLET  IF NEED FOR INCREASE WEIGHT OVERNIGHT   MAY  USE 80 MG OF LASIX EACH DAY UP TO  3 DAYSAT ONE TIME AT  . WHEN YOU USE EXTRA DOS OF LASIX  TAKE 20 MEQ OF POTASSIUM WITH THE DOSE.   -- INCREASE DOSE OF ENTERSTO 49/51 MG ONE TABLET TWICE A DAY   -- Sliding scale Lasix: Weigh yourself when you get home, then Daily in the Morning. Your dry weight will be what your scale says on the day you return home.(here is  240 lbs.).   If you gain more than 3 pounds from dry weight: Increase the Lasix dosing to 80 mg in the morning and take 20 meq of potassium  until weight returns to baseline dry weight.  If weight gain is greater than 5 pounds in 2 days: Increased to Lasix 80 mg in the morning and 40 mg in the evening , and take 20 meq potassium  and contact the office for further assistance if weight does  not go down the next day.  If the weight goes down more than 3 pounds from dry weight: Hold Lasix until it returns to baseline dry weight  If you need a refill on your cardiac medications before your next appointment, please call your pharmacy.   Lab work: NOT NEEDED If you have labs (blood work) drawn today and your tests are completely normal, you will receive your results only by: Marland Kitchen MyChart Message (if you have MyChart) OR . A paper copy in the mail If you have any lab test that is abnormal or we need to change your treatment, we will call you to review the results.  Testing/Procedures: NOT NEEDED  Follow-Up: At [facility], you and your health needs are our priority.  As part of our continuing mission to provide you with exceptional heart care, we have created designated Provider Care Teams.  These Care Teams include your primary Cardiologist (physician) and Advanced Practice  Providers (APPs -  Physician Assistants and Nurse Practitioners) who all work together to provide you with the care you need, when you need it. . Your physician recommends that you schedule a follow-up appointment in [place] (MAY 2020) .  Marland Kitchen Your physician recommends that you schedule a follow-up appointment in [place] ( AUG 2020) .   Any Other Special Instructions Will Be Listed Below (If Applicable).  Gradual Increase activity to lose weight    Studies Ordered:   No orders of the defined types were placed in this encounter.     Glenetta Hew, M.D., M.S. Interventional Cardiologist   Pager # [phone] Phone # [phone] [address] [place], [place] [address]   Thank you for choosing [facility]care at [facility]!!

## 2018-08-23 ENCOUNTER — Encounter: Payer: Self-pay | Admitting: Cardiology

## 2018-08-23 NOTE — Assessment & Plan Note (Signed)
Follow-up echocardiogram showed relatively preserved EF now roughly 45%.  Reported grade 1 diastolic dysfunction but probably at least grade 1 if not 2 based on symptoms.  Now totally compensated class I to class II symptoms at worst. Is on excellent dose of carvedilol and spironolactone,  I am increasing dose of Entresto now I reiterated importance of sliding scale Lasix in addition to -short increased dose timeframe to return to baseline dry weight of roughly 240 pounds at home.

## 2018-08-23 NOTE — Assessment & Plan Note (Addendum)
For right now, in the absence of active angina symptoms, would hold off on further ischemic evaluation, especially since her EF is improved.  May benefit from coronary CT angiogram.  She is on excellent doses of medications but is not currently on statin. ->  Would recommend LDL less than at least 100 if not less than 70.

## 2018-08-23 NOTE — Assessment & Plan Note (Signed)
Once stabilized from a symptom standpoint, I think we probably need to consider ischemic evaluation in light of coronary calcification noted on CT scan.  Would consider ordering a Coronary CT angiogram at next visit with Jory Sims, NP.

## 2018-08-23 NOTE — Assessment & Plan Note (Signed)
At follow-up visit, we need to recheck lipids if had not been checked elsewhere.  Low threshold to consider adding statin.

## 2018-08-23 NOTE — Assessment & Plan Note (Signed)
Notably improved EF on recent echocardiograms.  Now just mildly reduced.  She still has some diastolic dysfunction though probably. Still has maybe class I and now class II symptoms with volume overload.  Her cardiomyopathy notably improved with Entresto and carvedilol.  She is also on stable dose of spironolactone.  Plan: With recent exacerbation of symptoms and weight gain of over 7 pounds from baseline, will need to further titrate medications and adjust her diuretic.  We also discussed sliding scale Lasix.   -- INCREASE DOSE OF ENTERSTO 49/51 MG ONE TABLET TWICE A DAY   -- DRY WEIGHT SHOULD BE 240 LBS --> increase Lasix to 80 mg daily for 3 days then alternate 40 mg 1 day and 80 mg 1 day until down to dry weight of roughly 240 pounds at home.  SLIDING SCALE LASIX RECOMMENDATIONS: MAY TAKE LASIX ( FUROSEMIDE) 1 TO 2 TABLET  IF NEED FOR INCREASE WEIGHT OVERNIGHT   MAY  USE 80 MG OF LASIX EACH DAY UP TO  3 DAYSAT ONE TIME AT  .  -WHEN YOU USE EXTRA DOSE OF LASIX  TAKE 20 MEQ OF POTASSIUM WITH THE DOSE.

## 2018-08-23 NOTE — Assessment & Plan Note (Signed)
Essentially this is borderline morbid obesity with her risk factors and BMI greater than 35.  Discussed the importance of weight loss, I think at least some of the weight gain that she has had from her baseline is related to her dietary discretion.  Hopefully, she will start to lose weight by adjusting her diet, and will need to adjust her dry weight accordingly.

## 2018-08-28 ENCOUNTER — Other Ambulatory Visit: Payer: Self-pay

## 2018-08-28 NOTE — Telephone Encounter (Signed)
Called patient and notified that naprosyn is a non-cardiac medication. Suggested she contact Cira Rue, NP who refilled the medication for her last Jan 2019 per Epic notes

## 2018-08-28 NOTE — Telephone Encounter (Signed)
Pt called in requesting a refill on Naproxen. Would you like to refill this medication? Please address. Thank you.

## 2018-09-10 ENCOUNTER — Other Ambulatory Visit: Payer: Self-pay

## 2018-09-10 ENCOUNTER — Inpatient Hospital Stay: Payer: Medicare HMO | Attending: Hematology

## 2018-09-10 ENCOUNTER — Other Ambulatory Visit: Payer: Self-pay | Admitting: Internal Medicine

## 2018-09-10 DIAGNOSIS — Z171 Estrogen receptor negative status [ER-]: Principal | ICD-10-CM

## 2018-09-10 DIAGNOSIS — C50512 Malignant neoplasm of lower-outer quadrant of left female breast: Secondary | ICD-10-CM

## 2018-09-10 DIAGNOSIS — E782 Mixed hyperlipidemia: Secondary | ICD-10-CM

## 2018-09-21 ENCOUNTER — Ambulatory Visit: Payer: Medicare HMO | Admitting: Family Medicine

## 2018-09-22 ENCOUNTER — Encounter: Payer: Self-pay | Admitting: Family Medicine

## 2018-09-22 ENCOUNTER — Ambulatory Visit (INDEPENDENT_AMBULATORY_CARE_PROVIDER_SITE_OTHER): Payer: Medicare HMO | Admitting: Family Medicine

## 2018-09-22 VITALS — BP 120/70 | HR 83 | Temp 98.7°F | Resp 16 | Wt 251.4 lb

## 2018-09-22 DIAGNOSIS — J0140 Acute pansinusitis, unspecified: Secondary | ICD-10-CM

## 2018-09-22 DIAGNOSIS — R05 Cough: Secondary | ICD-10-CM

## 2018-09-22 DIAGNOSIS — Z8709 Personal history of other diseases of the respiratory system: Secondary | ICD-10-CM | POA: Diagnosis not present

## 2018-09-22 DIAGNOSIS — R059 Cough, unspecified: Secondary | ICD-10-CM

## 2018-09-22 DIAGNOSIS — M25841 Other specified joint disorders, right hand: Secondary | ICD-10-CM

## 2018-09-22 MED ORDER — SULFAMETHOXAZOLE-TRIMETHOPRIM 800-160 MG PO TABS: 1.0000 | ORAL_TABLET | Freq: Two times a day (BID) | ORAL | 0 refills | Status: DC

## 2018-09-22 MED ORDER — ALBUTEROL SULFATE HFA 108 (90 BASE) MCG/ACT IN AERS: 2.0000 | INHALATION_SPRAY | Freq: Four times a day (QID) | RESPIRATORY_TRACT | 0 refills | Status: DC | PRN

## 2018-09-22 MED ORDER — BENZONATATE 200 MG PO CAPS: 200.0000 mg | ORAL_CAPSULE | Freq: Two times a day (BID) | ORAL | 0 refills | Status: DC | PRN

## 2018-09-22 NOTE — Patient Instructions (Addendum)
You appear to have a sinus infection.  Take the antibiotic as prescribed.  Take over-the-counter Mucinex for congestion.  You can also try a steroid nasal spray over-the-counter such as Nasacort or Flonase.  You may also take the Tessalon for cough.  I sent this to your pharmacy.  Stay well-hydrated.  Use the albuterol inhaler as needed for cough and wheezing.  Return to see me if you are not back to baseline after completing the antibiotic or if you are getting worse.

## 2018-09-22 NOTE — Progress Notes (Signed)
Chief Complaint  Patient presents with  . sinus    sinus- runny nose, sneezing, cough, headaches, coughing so much- urinates, knot in right hand    Subjective:  Kristin Ward is a 63 y.o. female who presents for possible sinus infection.  Symptoms include a 6 day history of rhinorrhea, nasal congestion, frontal headache, mild cough.  States she is wheezing at times.  Denies fever, chills, dizziness, ear pain, sore throat, chest pain, palpitations, shortness of breath, abdominal pain, nausea, vomiting, diarrhea.  Past history is significant for asthma.  Does not have an albuterol inhaler at home.  Denies any recent flares.  Patient is a non-smoker.  Using Benadryl and OTC cold medication for symptoms.  Positive sick contacts.  No other aggravating or relieving factors.    Complains of cyst on right palm for the past few weeks. Tender.   ROS as in subjective   Objective: Vitals:   09/22/18 1042  BP: 120/70  Pulse: 83  Resp: 16  Temp: 98.7 F (37.1 C)  SpO2: 98%    General appearance: Alert, WD/WN, no distress                             Skin: warm, no rash                           Head: + Frontal and marketed maxillary sinus tenderness,                            Eyes: conjunctiva normal, corneas clear, PERRLA                            Ears: pearly TMs, external ear canals normal                          Nose: septum midline, turbinates swollen, with erythema and thick discharge             Mouth/throat: MMM, tongue normal, mild pharyngeal erythema                           Neck: supple, no adenopathy, no thyromegaly, nontender                          Heart: RRR, normal S1, S2, no murmurs                         Lungs: faint expiratory wheezes, rales, or rhonchi       Assessment and Plan: Acute pansinusitis, recurrence not specified - Plan: sulfamethoxazole-trimethoprim (BACTRIM DS,SEPTRA DS) 800-160 MG tablet  Cough - Plan: benzonatate (TESSALON) 200 MG capsule,  albuterol (PROVENTIL HFA;VENTOLIN HFA) 108 (90 Base) MCG/ACT inhaler  History of asthma - Plan: albuterol (PROVENTIL HFA;VENTOLIN HFA) 108 (90 Base) MCG/ACT inhaler  Cyst of joint of hand, right   Discussed that she appears to have acute sinusitis.  Bactrim prescribed, penicillin allergy.  She may use an over-the-counter nasal steroid such as Nasacort.  Can use OTC Mucinex for congestion.  Tessalon prescribed for cough as well.  Tylenol for pain.  Discussed symptomatic relief, nasal saline flush, and call or return if worse or not back to baseline after completing  the antibiotic. Discussed that she has a cyst on her right palm and we will keep an eye on this.

## 2018-09-25 ENCOUNTER — Other Ambulatory Visit: Payer: Self-pay

## 2018-09-25 ENCOUNTER — Encounter (HOSPITAL_BASED_OUTPATIENT_CLINIC_OR_DEPARTMENT_OTHER): Payer: Self-pay | Admitting: *Deleted

## 2018-09-25 NOTE — Progress Notes (Signed)
Reviewed pmh and recent cold/ cough with Dr Sabra Heck. Pt ok to proceed with PAC removal on 2/20.

## 2018-09-30 ENCOUNTER — Encounter (HOSPITAL_BASED_OUTPATIENT_CLINIC_OR_DEPARTMENT_OTHER)
Admission: RE | Admit: 2018-09-30 | Discharge: 2018-09-30 | Disposition: A | Discharge status: Home / Self Care | Payer: Medicare HMO | Source: Ambulatory Visit | Attending: General Surgery | Admitting: General Surgery

## 2018-09-30 DIAGNOSIS — Z01812 Encounter for preprocedural laboratory examination: Secondary | ICD-10-CM | POA: Diagnosis not present

## 2018-09-30 LAB — BASIC METABOLIC PANEL
Anion gap: 7 (ref 5–15)
BUN: 11 mg/dL (ref 6–20)
CO2: 24 mmol/L (ref 22–32)
Calcium: 9 mg/dL (ref 8.9–10.3)
Chloride: 109 mmol/L (ref 98–111)
Creatinine, Ser: 0.84 mg/dL (ref 0.44–1.00)
GFR calc Af Amer: >60 mL/min (ref >60)
GFR calc non Af Amer: >60 mL/min (ref >60)
Glucose, Bld: 142 mg/dL — ABNORMAL HIGH (ref 70–99)
Potassium: 4 mmol/L (ref 3.5–5.1)
Sodium: 140 mmol/L (ref 135–145)

## 2018-09-30 NOTE — Progress Notes (Signed)
Anesthesia consult per Dr. Ermalene Postin due to pt's productive cough, will postpone surgery until illness is resolved, notified Abigail Butts at [place].

## 2018-10-01 ENCOUNTER — Ambulatory Visit (HOSPITAL_BASED_OUTPATIENT_CLINIC_OR_DEPARTMENT_OTHER): Admission: RE | Admit: 2018-10-01 | Payer: Medicare HMO | Source: Home / Self Care | Admitting: General Surgery

## 2018-10-01 SURGERY — REMOVAL PORT-A-CATH
Anesthesia: Monitor Anesthesia Care

## 2018-11-12 ENCOUNTER — Other Ambulatory Visit: Payer: Self-pay

## 2018-11-12 ENCOUNTER — Encounter: Payer: Self-pay | Admitting: Family Medicine

## 2018-11-12 ENCOUNTER — Ambulatory Visit (INDEPENDENT_AMBULATORY_CARE_PROVIDER_SITE_OTHER): Payer: Medicare HMO | Admitting: Family Medicine

## 2018-11-12 ENCOUNTER — Telehealth: Payer: Self-pay | Admitting: Hematology

## 2018-11-12 VITALS — Wt 248.0 lb

## 2018-11-12 DIAGNOSIS — E7800 Pure hypercholesterolemia, unspecified: Secondary | ICD-10-CM | POA: Diagnosis not present

## 2018-11-12 DIAGNOSIS — J302 Other seasonal allergic rhinitis: Secondary | ICD-10-CM

## 2018-11-12 DIAGNOSIS — I5042 Chronic combined systolic (congestive) and diastolic (congestive) heart failure: Secondary | ICD-10-CM

## 2018-11-12 DIAGNOSIS — J4520 Mild intermittent asthma, uncomplicated: Secondary | ICD-10-CM

## 2018-11-12 DIAGNOSIS — Z79899 Other long term (current) drug therapy: Secondary | ICD-10-CM | POA: Diagnosis not present

## 2018-11-12 DIAGNOSIS — E669 Obesity, unspecified: Secondary | ICD-10-CM | POA: Diagnosis not present

## 2018-11-12 DIAGNOSIS — I10 Essential (primary) hypertension: Secondary | ICD-10-CM

## 2018-11-12 MED ORDER — LORATADINE 10 MG PO TABS: 10.0000 mg | ORAL_TABLET | Freq: Every day | ORAL | 5 refills | Status: DC

## 2018-11-12 NOTE — Progress Notes (Signed)
Documentation for Telephone encounter: Video access not available.   This telephone service is not related to other E/M service within previous 7 days.  Patient consented to the consult. Two patient identifiers used.  She is aware that her insurance company will be billed.  This telephone consult involved patient and myself, Harland Dingwall, NP-C  Subjective: Chief Complaint  Patient presents with  . med check    med check    She is due for a medication management visit.   She has a new complaint.  States she has seasonal allergies and has not been taking anything for it. Has taken Claritin in the past but states she needs a prescription so that she can afford it.  Complains of ichy and watery eyes, sneezing, rhinorrhea.  States she has seen an allergist in the past.   History of asthma that started during her pregnancy. No recent flares. Not needing albuterol currently.  Denies fever, chills, nasal congestion, sinus pain, ear pain, sore throat, shortness of breath, wheezing, abdominal pain, N/V/D.   CHF and HTN managed by her cardiologist. Denies chest pain, palpitations, orthopnea, or edema. She is keeping an eye on her fluid intake and weight. No increase.   Occasional headaches and takes Tylenol 1 gram and states this relieves her pain.  HL- she did not get her lipid panel done as I recommended at her previous visit. She wanted to have this drawn through her port-a- cath.    Cardiologist- Dr. Ellyn Hack  Oncologist- Dr. Burr Medico GI- Dr. Silverio Decamp  OB/GYN- Dr. Hulan Fray  Eyes- Dr. Katy Fitch Retina specialist- Dr. Zadie Rhine    Objective: Wt 248 lb (112.5 kg)   BMI 38.84 kg/m  Alert and oriented and in no acute distress. Speaking in complete sentences without difficulty.    Assessment: Medication management  Obesity (BMI 30-39.9)  Chronic combined systolic and diastolic heart failure (HCC)  Essential hypertension  Seasonal allergies - Plan: loratadine (CLARITIN) 10 MG tablet  Mild  intermittent asthma without complication  Elevated LDL cholesterol level   Plan: She appears to be doing well on current medications. No side effects.  Appears euvolemic.  Encouraged her to keep a close eye on her BP and weight.  Claritin sent to her pharmacy.  Asthma is well controlled and not an issue.  She has not had lipids checked since 2018 and did not get this done at our last visit as recommended. States she has had issues with her port and is having this addressed. Advised that she needs to have fasting lipids checked ASAP.   She will follow up in May as recommended with her cardiologist.   Time involving medical discussion was 24 minutes.  99441 (5-13min) 99442 (11-62min) 99443 (21-68min)

## 2018-11-12 NOTE — Telephone Encounter (Signed)
Spoke with patient re appointment 4/8.

## 2018-11-18 ENCOUNTER — Other Ambulatory Visit: Payer: Self-pay

## 2018-11-18 ENCOUNTER — Inpatient Hospital Stay: Payer: Medicare HMO | Attending: Hematology

## 2018-11-18 DIAGNOSIS — Z95828 Presence of other vascular implants and grafts: Secondary | ICD-10-CM | POA: Diagnosis not present

## 2018-11-18 MED ORDER — SODIUM CHLORIDE 0.9% FLUSH
10.0000 mL | INTRAVENOUS | Status: DC | PRN
  Administered 2018-11-18: 14:00:00 10 mL via INTRAVENOUS
  Filled 2018-11-18: qty 10

## 2018-11-18 MED ORDER — HEPARIN SOD (PORK) LOCK FLUSH 100 UNIT/ML IV SOLN
500.0000 [IU] | Freq: Once | INTRAVENOUS | Status: AC | PRN
  Administered 2018-11-18: 500 [IU] via INTRAVENOUS
  Filled 2018-11-18: qty 5

## 2018-11-19 ENCOUNTER — Encounter: Payer: Medicare HMO | Admitting: Family Medicine

## 2018-11-20 ENCOUNTER — Other Ambulatory Visit: Payer: Self-pay | Admitting: Cardiology

## 2018-11-20 NOTE — Telephone Encounter (Signed)
Furosemide refilled. 

## 2018-11-28 ENCOUNTER — Other Ambulatory Visit: Payer: Self-pay | Admitting: Nurse Practitioner

## 2018-12-16 ENCOUNTER — Other Ambulatory Visit: Payer: Self-pay

## 2018-12-16 ENCOUNTER — Encounter: Payer: Self-pay | Admitting: Family Medicine

## 2018-12-16 ENCOUNTER — Ambulatory Visit (INDEPENDENT_AMBULATORY_CARE_PROVIDER_SITE_OTHER): Payer: Medicare HMO | Admitting: Family Medicine

## 2018-12-16 VITALS — BP 114/92 | HR 98 | Wt 238.0 lb

## 2018-12-16 DIAGNOSIS — R350 Frequency of micturition: Secondary | ICD-10-CM | POA: Diagnosis not present

## 2018-12-16 DIAGNOSIS — R631 Polydipsia: Secondary | ICD-10-CM | POA: Diagnosis not present

## 2018-12-16 DIAGNOSIS — R7301 Impaired fasting glucose: Secondary | ICD-10-CM

## 2018-12-16 DIAGNOSIS — E782 Mixed hyperlipidemia: Secondary | ICD-10-CM | POA: Diagnosis not present

## 2018-12-16 DIAGNOSIS — R5383 Other fatigue: Secondary | ICD-10-CM

## 2018-12-16 DIAGNOSIS — E669 Obesity, unspecified: Secondary | ICD-10-CM

## 2018-12-16 DIAGNOSIS — I10 Essential (primary) hypertension: Secondary | ICD-10-CM | POA: Diagnosis not present

## 2018-12-16 NOTE — Progress Notes (Signed)
   Subjective:  Documentation for virtual telephone encounter.  Interactive audio and video telecommunications were attempted between this provider and patient, however due to patient not having access to video capability, we continued and completed visit with audio only.  The patient was located at home. 2 patient identifiers used.  The provider was located in the office. The patient did consent to this visit and is aware of possible charges through their insurance for this visit.  The other persons participating in this telemedicine service were none.    Patient ID: Kristin Ward, female    DOB: May 21, 1956, 63 y.o.   MRN: [id number]  HPI Chief Complaint  Patient presents with  . medcheck and urinary issues    med check and urinary issues- dry mouth, constant urination like 45-1 hour going. very thristy. does not have diabetes.    Complains of a 2 week history of increased thirst, dry mouth, urinary frequency. Also reports fatigue and decreased appetite. No unexplained weight loss.   Denies history of diabetes.   Not taking Lasix now.   Denies fever, chills, dizziness, headache, chest pain, palpitations, shortness of breath, cough, abdominal pain, N/V/D.   Overdue for recheck of lipids.   Reviewed allergies, medications, past medical, surgical, family, and social history.    Review of Systems Pertinent positives and negatives in the history of present illness.     Objective:   Physical Exam BP (!) 114/92   Pulse 98   Wt 238 lb (108 kg)   BMI 37.28 kg/m   Alert and oriented and in no acute distress.  Speaking in complete sentences without difficulty.  Normal speech, mood and thought process.      Assessment & Plan:  Polydipsia - Plan: POCT Urinalysis DIP (Proadvantage Device), POCT glucose (manual entry), TSH, T4, free, POCT glycosylated hemoglobin (Hb A1C), CANCELED: POCT glucose (manual entry), CANCELED: POCT Urinalysis DIP (Proadvantage Device)  Fatigue,  unspecified type - Plan: CBC with Differential/Platelet, Comprehensive metabolic panel, TSH, T4, free, VITAMIN D 25 Hydroxy (Vit-D Deficiency, Fractures), Vitamin B12, CANCELED: CBC with Differential/Platelet, CANCELED: Comprehensive metabolic panel, CANCELED: TSH, CANCELED: T4, free, CANCELED: VITAMIN D 25 Hydroxy (Vit-D Deficiency, Fractures), CANCELED: Vitamin B12  Urinary frequency - Plan: POCT Urinalysis DIP (Proadvantage Device), POCT glucose (manual entry), POCT glycosylated hemoglobin (Hb A1C), CANCELED: POCT glucose (manual entry), CANCELED: POCT Urinalysis DIP (Proadvantage Device)  Essential hypertension  Obesity (BMI 30-39.9)  Mixed hyperlipidemia - Plan: Lipid panel, CANCELED: Lipid panel  Impaired fasting blood sugar - Plan: POCT Urinalysis DIP (Proadvantage Device), POCT glucose (manual entry), POCT glycosylated hemoglobin (Hb A1C), CANCELED: POCT glucose (manual entry), CANCELED: POCT glycosylated hemoglobin (Hb A1C)  Discussed limitations of virtual visit. Does not appear to be in any acute distress. No history of diabetes but she does have a previous history of an elevated glucose.  Discussed multiple etiologies for fatigue.  Suspicious for diabetes due to polyuria polydipsia. She will come in tomorrow via [facility] for urinalysis dipstick, point-of-care glucose and point-of-care hemoglobin A1c as well as labs. Follow-up pending results.  Time spent on call was 14 minutes and in review of previous records 2 minutes total.  This virtual service is not related to other E/M service within previous 7 days.  99441 (5-56min) 99442 (11-11min) 99443 (21-88min)

## 2018-12-17 ENCOUNTER — Ambulatory Visit (INDEPENDENT_AMBULATORY_CARE_PROVIDER_SITE_OTHER): Payer: Medicare HMO | Admitting: Family Medicine

## 2018-12-17 ENCOUNTER — Encounter: Payer: Self-pay | Admitting: Family Medicine

## 2018-12-17 VITALS — BP 120/80 | HR 88 | Temp 98.0°F | Wt 239.8 lb

## 2018-12-17 DIAGNOSIS — R350 Frequency of micturition: Secondary | ICD-10-CM

## 2018-12-17 DIAGNOSIS — R5383 Other fatigue: Secondary | ICD-10-CM | POA: Diagnosis not present

## 2018-12-17 DIAGNOSIS — R739 Hyperglycemia, unspecified: Secondary | ICD-10-CM

## 2018-12-17 DIAGNOSIS — R631 Polydipsia: Secondary | ICD-10-CM

## 2018-12-17 DIAGNOSIS — E782 Mixed hyperlipidemia: Secondary | ICD-10-CM | POA: Diagnosis not present

## 2018-12-17 DIAGNOSIS — E119 Type 2 diabetes mellitus without complications: Secondary | ICD-10-CM

## 2018-12-17 DIAGNOSIS — R7301 Impaired fasting glucose: Secondary | ICD-10-CM

## 2018-12-17 LAB — POCT URINALYSIS DIP (PROADVANTAGE DEVICE)
Bilirubin, UA: NEGATIVE
Blood, UA: NEGATIVE
Glucose, UA: >=1000 mg/dL — AB
Leukocytes, UA: NEGATIVE
Nitrite, UA: NEGATIVE
Protein Ur, POC: NEGATIVE mg/dL
Specific Gravity, Urine: 1.015
Urobilinogen, Ur: NEGATIVE
pH, UA: 6 (ref 5.0–8.0)

## 2018-12-17 LAB — GLUCOSE, POCT (MANUAL RESULT ENTRY): POC Glucose: 446 mg/dl — AB (ref 70–99)

## 2018-12-17 LAB — POCT GLYCOSYLATED HEMOGLOBIN (HGB A1C): Hemoglobin A1C: 13 % — AB (ref 4.0–5.6)

## 2018-12-17 MED ORDER — METFORMIN HCL 1000 MG PO TABS: 1000.0000 mg | ORAL_TABLET | Freq: Two times a day (BID) | ORAL | 3 refills | Status: DC

## 2018-12-17 MED ORDER — GLUCOSE BLOOD VI STRP: ORAL_STRIP | 1 refills | Status: DC

## 2018-12-17 MED ORDER — BASAGLAR KWIKPEN 100 UNIT/ML ~~LOC~~ SOPN: 10.0000 [IU] | PEN_INJECTOR | Freq: Every day | SUBCUTANEOUS | 3 refills | Status: DC

## 2018-12-17 MED ORDER — ACCU-CHEK FASTCLIX LANCETS MISC: 2 refills | Status: DC

## 2018-12-17 NOTE — Progress Notes (Signed)
Subjective:    Patient ID: Kristin Ward, female    DOB: Aug 15, 1955, 63 y.o.   MRN: [id number]  HPI Chief Complaint  Patient presents with  . DIABETES    DIABETES-    Initially this was a lab visit which turned into an office visit due to a Hgb A1c of 13.0% and PCOT glucose of 446 She also has glucose in her urine 3+. I did a virtual visit with her yesterday to discuss symptoms.  She was transported via [facility] bus and her daughter is with her. She is legally blind.   She presents today with a 2 week history of polyuria, polydipsia, dry mouth, urinary frequency, fatigue and decreased appetite.  Denies history of diabetes.   Denies fever, chills, dizziness, chest pain, palpitations, shortness of breath, abdominal pain, N/V/D, LE edema.    States she has been drinking a lot of kool-aid, juice, other sugary drinks. Eats potatoes often.  Walks some for exercise.   She is overdue for lipid panel.   History of CHF, breast cancer.  Followed by cardiologist, Dr. Ellyn Hack and oncology     Review of Systems Pertinent positives and negatives in the history of present illness.     Objective:   Physical Exam BP 120/80   Pulse 88   Temp 98 F (36.7 C)   Wt 239 lb 12.8 oz (108.8 kg)   BMI 37.56 kg/m   Alert and oriented and in no distress.  Pharyngeal area is normal. Neck is supple without adenopathy or thyromegaly. Cardiac exam shows a regular rate and rhythm without murmurs or gallops. Lungs are clear to auscultation. Extremities without edema. Skin is warm and dry.       Assessment & Plan:  Diabetes mellitus, new onset ([place]) - Plan: Accu-Chek FastClix Lancets MISC, glucose blood (ACCU-CHEK GUIDE) test strip, Insulin Glargine (BASAGLAR KWIKPEN) 100 UNIT/ML SOPN, metFORMIN (GLUCOPHAGE) 1000 MG tablet, Insulin and C-Peptide, Microalbumin / creatinine urine ratio  Mixed hyperlipidemia - Plan: Lipid panel  Fatigue, unspecified type - Plan: Vitamin B12, VITAMIN D 25 Hydroxy  (Vit-D Deficiency, Fractures), T4, free, TSH, Comprehensive metabolic panel, CBC with Differential/Platelet  Polydipsia - Plan: POCT glycosylated hemoglobin (Hb A1C), T4, free, TSH, POCT glucose (manual entry), POCT Urinalysis DIP (Proadvantage Device)  Urinary frequency - Plan: POCT glycosylated hemoglobin (Hb A1C), POCT glucose (manual entry), POCT Urinalysis DIP (Proadvantage Device)  Impaired fasting blood sugar - Plan: POCT glycosylated hemoglobin (Hb A1C), POCT glucose (manual entry), POCT Urinalysis DIP (Proadvantage Device)  Hyperglycemia - Plan: Insulin and C-Peptide, Microalbumin / creatinine urine ratio  She is not in any acute distress however she is quite symptomatic from hyperglycemia.  Discussed diabetes spectrum and that she is currently in the toxic range and that our immediate goal is lowering her glucose. She will start on basal insulin, Basaglar 10 units daily since I have samples in the office. She and her daughter were taught how to administer this medication. I will also start her on Metformin 1,000 mg bid with meals. Discussed possible side effects including loose stools.  She will start checking blood sugars 2-3 times daily until I see her back next week.  Counseling on cutting out sugary drinks and cutting back on carbohydrates. She will increase walking as tolerated.  Advised her to go to the ADA website for information on standards of care for diabetes.  She will need to start on statin unless contraindicated. She is currently on Entresto.  She appears to be euvolemic, at her  recommended dry weight. Has not been taking Lasix but knows she may take this if needed for fluid retention.  I will forward new diabetes diagnosis to her oncologist and cardiologist.  She will return next week and bring in her readings and questions.

## 2018-12-17 NOTE — Patient Instructions (Signed)
Go to the American Diabetes Association website to read about diabetes and the standards of care.   Your hemoglobin A1c is 13% today. This means your blood sugars are averaging around 400 per day.   Start taking Metformin 1,000 mg twice daily with meals.  Start taking Basaglar (long acting insulin) 10 units daily.   Check your blood sugar 2-3 times per day for now.  Check it fasting (nothing to eat or drink except water for 6 hours) and check it 2 hours after a meal.  Keep a log of your readings and bring them in to your visit next week.   Cut out sugary drinks.  Cut back on potatoes, bread, rice, pasta and sweets.   Walking for exercise will help.   Return in 4 weeks to see me.  I will call you with lab results.    Diabetes Basics  Diabetes (diabetes mellitus) is a long-term (chronic) disease. It occurs when the body does not properly use sugar (glucose) that is released from food after you eat. Diabetes may be caused by one or both of these problems:  Your pancreas does not make enough of a hormone called insulin.  Your body does not react in a normal way to insulin that it makes. Insulin lets sugars (glucose) go into cells in your body. This gives you energy. If you have diabetes, sugars cannot get into cells. This causes high blood sugar (hyperglycemia). Follow these instructions at home: How is diabetes treated? You may need to take insulin or other diabetes medicines daily to keep your blood sugar in balance. Take your diabetes medicines every day as told by your doctor. List your diabetes medicines here: Diabetes medicines  Name of medicine: ______________________________ ? Amount (dose): _______________ Time (a.m./p.m.): _______________ Notes: ___________________________________  Name of medicine: ______________________________ ? Amount (dose): _______________ Time (a.m./p.m.): _______________ Notes: ___________________________________  Name of medicine:  ______________________________ ? Amount (dose): _______________ Time (a.m./p.m.): _______________ Notes: ___________________________________ If you use insulin, you will learn how to give yourself insulin by injection. You may need to adjust the amount based on the food that you eat. List the types of insulin you use here: Insulin  Insulin type: ______________________________ ? Amount (dose): _______________ Time (a.m./p.m.): _______________ Notes: ___________________________________  Insulin type: ______________________________ ? Amount (dose): _______________ Time (a.m./p.m.): _______________ Notes: ___________________________________  Insulin type: ______________________________ ? Amount (dose): _______________ Time (a.m./p.m.): _______________ Notes: ___________________________________  Insulin type: ______________________________ ? Amount (dose): _______________ Time (a.m./p.m.): _______________ Notes: ___________________________________  Insulin type: ______________________________ ? Amount (dose): _______________ Time (a.m./p.m.): _______________ Notes: ___________________________________ How do I manage my blood sugar?  Check your blood sugar levels using a blood glucose monitor as directed by your doctor. Your doctor will set treatment goals for you. Generally, you should have these blood sugar levels:  Before meals (preprandial): 80-130 mg/dL (4.4-7.2 mmol/L).  After meals (postprandial): below 180 mg/dL (10 mmol/L).  A1c level: less than 7%. Write down the times that you will check your blood sugar levels: Blood sugar checks  Time: _______________ Notes: ___________________________________  Time: _______________ Notes: ___________________________________  Time: _______________ Notes: ___________________________________  Time: _______________ Notes: ___________________________________  Time: _______________ Notes: ___________________________________  Time:  _______________ Notes: ___________________________________  What do I need to know about low blood sugar? Low blood sugar is called hypoglycemia. This is when blood sugar is at or below 70 mg/dL (3.9 mmol/L). Symptoms may include:  Feeling: ? Hungry. ? Worried or nervous (anxious). ? Sweaty and clammy. ? Confused. ? Dizzy. ? Sleepy. ? Sick to your  stomach (nauseous).  Having: ? A fast heartbeat. ? A headache. ? A change in your vision. ? Tingling or no feeling (numbness) around the mouth, lips, or tongue. ? Jerky movements that you cannot control (seizure).  Having trouble with: ? Moving (coordination). ? Sleeping. ? Passing out (fainting). ? Getting upset easily (irritability). Treating low blood sugar To treat low blood sugar, eat or drink something sugary right away. If you can think clearly and swallow safely, follow the 15:15 rule:  Take 15 grams of a fast-acting carb (carbohydrate). Talk with your doctor about how much you should take.  Some fast-acting carbs are: ? Sugar tablets (glucose pills). Take 3-4 glucose pills. ? 6-8 pieces of hard candy. ? 4-6 oz (120-150 mL) of fruit juice. ? 4-6 oz (120-150 mL) of regular (not diet) soda. ? 1 Tbsp (15 mL) honey or sugar.  Check your blood sugar 15 minutes after you take the carb.  If your blood sugar is still at or below 70 mg/dL (3.9 mmol/L), take 15 grams of a carb again.  If your blood sugar does not go above 70 mg/dL (3.9 mmol/L) after 3 tries, get help right away.  After your blood sugar goes back to normal, eat a meal or a snack within 1 hour. Treating very low blood sugar If your blood sugar is at or below 54 mg/dL (3 mmol/L), you have very low blood sugar (severe hypoglycemia). This is an emergency. Do not wait to see if the symptoms will go away. Get medical help right away. Call your local emergency services (911 in the [place] Do not drive yourself to the hospital. Questions to ask your health care  provider  Do I need to meet with a diabetes educator?  What equipment will I need to care for myself at home?  What diabetes medicines do I need? When should I take them?  How often do I need to check my blood sugar?  What number can I call if I have questions?  When is my next doctor's visit?  Where can I find a support group for people with diabetes? Where to find more information  American Diabetes Association: www.diabetes.org  American Association of Diabetes Educators: www.diabeteseducator.org/patient-resources Contact a doctor if:  Your blood sugar is at or above 240 mg/dL (13.3 mmol/L) for 2 days in a row.  You have been sick or have had a fever for 2 days or more, and you are not getting better.  You have any of these problems for more than 6 hours: ? You cannot eat or drink. ? You feel sick to your stomach (nauseous). ? You throw up (vomit). ? You have watery poop (diarrhea). Get help right away if:  Your blood sugar is lower than 54 mg/dL (3 mmol/L).  You get confused.  You have trouble: ? Thinking clearly. ? Breathing. Summary  Diabetes (diabetes mellitus) is a long-term (chronic) disease. It occurs when the body does not properly use sugar (glucose) that is released from food after digestion.  Take insulin and diabetes medicines as told.  Check your blood sugar every day, as often as told.  Keep all follow-up visits as told by your doctor. This is important. This information is not intended to replace advice given to you by your health care provider. Make sure you discuss any questions you have with your health care provider. Document Released: 10/31/2017 Document Revised: 01/19/2018 Document Reviewed: 10/31/2017 Elsevier Interactive Patient Education  2019 [employer].

## 2018-12-18 LAB — MICROALBUMIN / CREATININE URINE RATIO
Creatinine, Urine: 61.7 mg/dL
Microalb/Creat Ratio: 21 mg/g creat (ref 0–29)
Microalbumin, Urine: 12.9 ug/mL

## 2018-12-18 LAB — INSULIN AND C-PEPTIDE, SERUM
C-Peptide: 2.2 ng/mL (ref 1.1–4.4)
INSULIN: 9.2 u[IU]/mL (ref 2.6–24.9)

## 2018-12-19 LAB — CBC WITH DIFFERENTIAL/PLATELET
Basophils Absolute: 0 10*3/uL (ref 0.0–0.2)
Basos: 0 %
EOS (ABSOLUTE): 0.1 10*3/uL (ref 0.0–0.4)
Eos: 2 %
Hematocrit: 37.7 % (ref 34.0–46.6)
Hemoglobin: 12.9 g/dL (ref 11.1–15.9)
Immature Grans (Abs): 0 10*3/uL (ref 0.0–0.1)
Immature Granulocytes: 0 %
Lymphocytes Absolute: 1.5 10*3/uL (ref 0.7–3.1)
Lymphs: 33 %
MCH: 29.8 pg (ref 26.6–33.0)
MCHC: 34.2 g/dL (ref 31.5–35.7)
MCV: 87 fL (ref 79–97)
Monocytes Absolute: 0.4 10*3/uL (ref 0.1–0.9)
Monocytes: 8 %
Neutrophils Absolute: 2.5 10*3/uL (ref 1.4–7.0)
Neutrophils: 57 %
Platelets: 301 10*3/uL (ref 150–450)
RBC: 4.33 x10E6/uL (ref 3.77–5.28)
RDW: 12 % (ref 11.7–15.4)
WBC: 4.6 10*3/uL (ref 3.4–10.8)

## 2018-12-19 LAB — COMPREHENSIVE METABOLIC PANEL
ALT: 14 IU/L (ref 0–32)
AST: 16 IU/L (ref 0–40)
Albumin/Globulin Ratio: 1.5 (ref 1.2–2.2)
Albumin: 4.3 g/dL (ref 3.8–4.9)
Alkaline Phosphatase: 140 IU/L — ABNORMAL HIGH (ref 39–117)
BUN/Creatinine Ratio: 8 — ABNORMAL LOW (ref 12–28)
BUN: 7 mg/dL — ABNORMAL LOW (ref 8–27)
Bilirubin Total: 0.5 mg/dL (ref 0.0–1.2)
CO2: 19 mmol/L — ABNORMAL LOW (ref 20–29)
Calcium: 9.7 mg/dL (ref 8.7–10.3)
Chloride: 101 mmol/L (ref 96–106)
Creatinine, Ser: 0.85 mg/dL (ref 0.57–1.00)
GFR calc Af Amer: 86 mL/min/{1.73_m2} (ref >59)
GFR calc non Af Amer: 75 mL/min/{1.73_m2} (ref >59)
Globulin, Total: 2.9 g/dL (ref 1.5–4.5)
Glucose: 468 mg/dL — ABNORMAL HIGH (ref 65–99)
Potassium: 4.6 mmol/L (ref 3.5–5.2)
Sodium: 138 mmol/L (ref 134–144)
Total Protein: 7.2 g/dL (ref 6.0–8.5)

## 2018-12-19 LAB — LIPID PANEL
Chol/HDL Ratio: 6.1 ratio — ABNORMAL HIGH (ref 0.0–4.4)
Cholesterol, Total: 176 mg/dL (ref 100–199)
HDL: 29 mg/dL — ABNORMAL LOW (ref >39)
LDL Calculated: 103 mg/dL — ABNORMAL HIGH (ref 0–99)
Triglycerides: 220 mg/dL — ABNORMAL HIGH (ref 0–149)
VLDL Cholesterol Cal: 44 mg/dL — ABNORMAL HIGH (ref 5–40)

## 2018-12-19 LAB — TSH: TSH: 3.92 u[IU]/mL (ref 0.450–4.500)

## 2018-12-19 LAB — T4, FREE: Free T4: 1.39 ng/dL (ref 0.82–1.77)

## 2018-12-19 LAB — VITAMIN B12: Vitamin B-12: 1036 pg/mL (ref 232–1245)

## 2018-12-19 LAB — VITAMIN D 25 HYDROXY (VIT D DEFICIENCY, FRACTURES): Vit D, 25-Hydroxy: 20.8 ng/mL — ABNORMAL LOW (ref 30.0–100.0)

## 2018-12-23 ENCOUNTER — Other Ambulatory Visit: Payer: Self-pay | Admitting: Internal Medicine

## 2018-12-23 ENCOUNTER — Telehealth: Payer: Self-pay | Admitting: Internal Medicine

## 2018-12-23 ENCOUNTER — Other Ambulatory Visit: Payer: Self-pay | Admitting: Family Medicine

## 2018-12-23 DIAGNOSIS — N898 Other specified noninflammatory disorders of vagina: Secondary | ICD-10-CM

## 2018-12-23 DIAGNOSIS — R81 Glycosuria: Secondary | ICD-10-CM

## 2018-12-23 MED ORDER — FLUCONAZOLE 150 MG PO TABS: 150.0000 mg | ORAL_TABLET | Freq: Once | ORAL | 0 refills | Status: DC

## 2018-12-23 MED ORDER — FLUCONAZOLE 150 MG PO TABS: 150.0000 mg | ORAL_TABLET | Freq: Once | ORAL | 0 refills | Status: AC

## 2018-12-23 NOTE — Telephone Encounter (Signed)
Pt called and states she has had 2 days of vagina itching and discharge. She is not sure if its a side effect from recent medicine she was put on but she said is there anything she can do? She couldn't wait until tomorrow cause its just getting so bad. [facility]

## 2018-12-23 NOTE — Telephone Encounter (Signed)
Pt was notified.  

## 2018-12-23 NOTE — Telephone Encounter (Signed)
She may have a yeast infection since she is urinating so much sugar. I will send in diflucan for her to take.

## 2018-12-24 ENCOUNTER — Telehealth: Payer: Self-pay | Admitting: Adult Health

## 2018-12-24 ENCOUNTER — Other Ambulatory Visit: Payer: Self-pay

## 2018-12-24 ENCOUNTER — Encounter: Payer: Self-pay | Admitting: Family Medicine

## 2018-12-24 ENCOUNTER — Ambulatory Visit (INDEPENDENT_AMBULATORY_CARE_PROVIDER_SITE_OTHER): Payer: Medicare HMO | Admitting: Family Medicine

## 2018-12-24 VITALS — BP 122/80 | HR 80 | Temp 98.0°F | Resp 16 | Wt 239.2 lb

## 2018-12-24 DIAGNOSIS — I5042 Chronic combined systolic (congestive) and diastolic (congestive) heart failure: Secondary | ICD-10-CM

## 2018-12-24 DIAGNOSIS — I10 Essential (primary) hypertension: Secondary | ICD-10-CM

## 2018-12-24 DIAGNOSIS — N898 Other specified noninflammatory disorders of vagina: Secondary | ICD-10-CM

## 2018-12-24 DIAGNOSIS — E119 Type 2 diabetes mellitus without complications: Secondary | ICD-10-CM

## 2018-12-24 MED ORDER — FLUCONAZOLE 150 MG PO TABS: 150.0000 mg | ORAL_TABLET | Freq: Once | ORAL | 0 refills | Status: AC

## 2018-12-24 MED ORDER — ATORVASTATIN CALCIUM 20 MG PO TABS: 20.0000 mg | ORAL_TABLET | Freq: Every day | ORAL | 1 refills | Status: DC

## 2018-12-24 NOTE — Patient Instructions (Addendum)
Increase your Lantus to 14 units daily.  Continue on twice daily Metformin with meals.   Start on the statin for your cholesterol. This is once daily and is at [employer].   You will receive a call from the diabetes nutritionist to schedule a visit.   Follow up with me in 4 weeks. Bring in your blood sugar readings.   You may check with your insurance company and ask if they will pay for a blood sugar meter for blind individuals. They make these. They will read out the blood sugar.   Diabetes Mellitus and Exercise Exercising regularly is important for your overall health, especially when you have diabetes (diabetes mellitus). Exercising is not only about losing weight. It has many other health benefits, such as increasing muscle strength and bone density and reducing body fat and stress. This leads to improved fitness, flexibility, and endurance, all of which result in better overall health. Exercise has additional benefits for people with diabetes, including:  Reducing appetite.  Helping to lower and control blood glucose.  Lowering blood pressure.  Helping to control amounts of fatty substances (lipids) in the blood, such as cholesterol and triglycerides.  Helping the body to respond better to insulin (improving insulin sensitivity).  Reducing how much insulin the body needs.  Decreasing the risk for heart disease by: ? Lowering cholesterol and triglyceride levels. ? Increasing the levels of good cholesterol. ? Lowering blood glucose levels. What is my activity plan? Your health care provider or certified diabetes educator can help you make a plan for the type and frequency of exercise (activity plan) that works for you. Make sure that you:  Do at least 150 minutes of moderate-intensity or vigorous-intensity exercise each week. This could be brisk walking, biking, or water aerobics. ? Do stretching and strength exercises, such as yoga or weightlifting, at least 2 times a week. ?  Spread out your activity over at least 3 days of the week.  Get some form of physical activity every day. ? Do not go more than 2 days in a row without some kind of physical activity. ? Avoid being inactive for more than 30 minutes at a time. Take frequent breaks to walk or stretch.  Choose a type of exercise or activity that you enjoy, and set realistic goals.  Start slowly, and gradually increase the intensity of your exercise over time. What do I need to know about managing my diabetes?   Check your blood glucose before and after exercising. ? If your blood glucose is 240 mg/dL (13.3 mmol/L) or higher before you exercise, check your urine for ketones. If you have ketones in your urine, do not exercise until your blood glucose returns to normal. ? If your blood glucose is 100 mg/dL (5.6 mmol/L) or lower, eat a snack containing 15-20 grams of carbohydrate. Check your blood glucose 15 minutes after the snack to make sure that your level is above 100 mg/dL (5.6 mmol/L) before you start your exercise.  Know the symptoms of low blood glucose (hypoglycemia) and how to treat it. Your risk for hypoglycemia increases during and after exercise. Common symptoms of hypoglycemia can include: ? Hunger. ? Anxiety. ? Sweating and feeling clammy. ? Confusion. ? Dizziness or feeling light-headed. ? Increased heart rate or palpitations. ? Blurry vision. ? Tingling or numbness around the mouth, lips, or tongue. ? Tremors or shakes. ? Irritability.  Keep a rapid-acting carbohydrate snack available before, during, and after exercise to help prevent or treat hypoglycemia.  Avoid injecting insulin into areas of the body that are going to be exercised. For example, avoid injecting insulin into: ? The arms, when playing tennis. ? The legs, when jogging.  Keep records of your exercise habits. Doing this can help you and your health care provider adjust your diabetes management plan as needed. Write down: ?  Food that you eat before and after you exercise. ? Blood glucose levels before and after you exercise. ? The type and amount of exercise you have done. ? When your insulin is expected to peak, if you use insulin. Avoid exercising at times when your insulin is peaking.  When you start a new exercise or activity, work with your health care provider to make sure the activity is safe for you, and to adjust your insulin, medicines, or food intake as needed.  Drink plenty of water while you exercise to prevent dehydration or heat stroke. Drink enough fluid to keep your urine clear or pale yellow. Summary  Exercising regularly is important for your overall health, especially when you have diabetes (diabetes mellitus).  Exercising has many health benefits, such as increasing muscle strength and bone density and reducing body fat and stress.  Your health care provider or certified diabetes educator can help you make a plan for the type and frequency of exercise (activity plan) that works for you.  When you start a new exercise or activity, work with your health care provider to make sure the activity is safe for you, and to adjust your insulin, medicines, or food intake as needed. This information is not intended to replace advice given to you by your health care provider. Make sure you discuss any questions you have with your health care provider. Document Released: 10/19/2003 Document Revised: 02/06/2017 Document Reviewed: 01/08/2016 Elsevier Interactive Patient Education  2019 [place].   Diabetes Mellitus and Nutrition, Adult When you have diabetes (diabetes mellitus), it is very important to have healthy eating habits because your blood sugar (glucose) levels are greatly affected by what you eat and drink. Eating healthy foods in the appropriate amounts, at about the same times every day, can help you:  Control your blood glucose.  Lower your risk of heart disease.  Improve your blood  pressure.  Reach or maintain a healthy weight. Every person with diabetes is different, and each person has different needs for a meal plan. Your health care provider may recommend that you work with a diet and nutrition specialist (dietitian) to make a meal plan that is best for you. Your meal plan may vary depending on factors such as:  The calories you need.  The medicines you take.  Your weight.  Your blood glucose, blood pressure, and cholesterol levels.  Your activity level.  Other health conditions you have, such as heart or kidney disease. How do carbohydrates affect me? Carbohydrates, also called carbs, affect your blood glucose level more than any other type of food. Eating carbs naturally raises the amount of glucose in your blood. Carb counting is a method for keeping track of how many carbs you eat. Counting carbs is important to keep your blood glucose at a healthy level, especially if you use insulin or take certain oral diabetes medicines. It is important to know how many carbs you can safely have in each meal. This is different for every person. Your dietitian can help you calculate how many carbs you should have at each meal and for each snack. Foods that contain carbs include:  Bread,  cereal, rice, pasta, and crackers.  Potatoes and corn.  Peas, beans, and lentils.  Milk and yogurt.  Fruit and juice.  Desserts, such as cakes, cookies, ice cream, and candy. How does alcohol affect me? Alcohol can cause a sudden decrease in blood glucose (hypoglycemia), especially if you use insulin or take certain oral diabetes medicines. Hypoglycemia can be a life-threatening condition. Symptoms of hypoglycemia (sleepiness, dizziness, and confusion) are similar to symptoms of having too much alcohol. If your health care provider says that alcohol is safe for you, follow these guidelines:  Limit alcohol intake to no more than 1 drink per day for nonpregnant women and 2 drinks per  day for men. One drink equals 12 oz of beer, 5 oz of wine, or 1 oz of hard liquor.  Do not drink on an empty stomach.  Keep yourself hydrated with water, diet soda, or unsweetened iced tea.  Keep in mind that regular soda, juice, and other mixers may contain a lot of sugar and must be counted as carbs. What are tips for following this plan?  Reading food labels  Start by checking the serving size on the "Nutrition Facts" label of packaged foods and drinks. The amount of calories, carbs, fats, and other nutrients listed on the label is based on one serving of the item. Many items contain more than one serving per package.  Check the total grams (g) of carbs in one serving. You can calculate the number of servings of carbs in one serving by dividing the total carbs by 15. For example, if a food has 30 g of total carbs, it would be equal to 2 servings of carbs.  Check the number of grams (g) of saturated and trans fats in one serving. Choose foods that have low or no amount of these fats.  Check the number of milligrams (mg) of salt (sodium) in one serving. Most people should limit total sodium intake to less than 2,300 mg per day.  Always check the nutrition information of foods labeled as "low-fat" or "nonfat". These foods may be higher in added sugar or refined carbs and should be avoided.  Talk to your dietitian to identify your daily goals for nutrients listed on the label. Shopping  Avoid buying canned, premade, or processed foods. These foods tend to be high in fat, sodium, and added sugar.  Shop around the outside edge of the grocery store. This includes fresh fruits and vegetables, bulk grains, fresh meats, and fresh dairy. Cooking  Use low-heat cooking methods, such as baking, instead of high-heat cooking methods like deep frying.  Cook using healthy oils, such as olive, canola, or sunflower oil.  Avoid cooking with butter, cream, or high-fat meats. Meal planning  Eat  meals and snacks regularly, preferably at the same times every day. Avoid going long periods of time without eating.  Eat foods high in fiber, such as fresh fruits, vegetables, beans, and whole grains. Talk to your dietitian about how many servings of carbs you can eat at each meal.  Eat 4-6 ounces (oz) of lean protein each day, such as lean meat, chicken, fish, eggs, or tofu. One oz of lean protein is equal to: ? 1 oz of meat, chicken, or fish. ? 1 egg. ?  cup of tofu.  Eat some foods each day that contain healthy fats, such as avocado, nuts, seeds, and fish. Lifestyle  Check your blood glucose regularly.  Exercise regularly as told by your health care provider. This may include: ?  150 minutes of moderate-intensity or vigorous-intensity exercise each week. This could be brisk walking, biking, or water aerobics. ? Stretching and doing strength exercises, such as yoga or weightlifting, at least 2 times a week.  Take medicines as told by your health care provider.  Do not use any products that contain nicotine or tobacco, such as cigarettes and e-cigarettes. If you need help quitting, ask your health care provider.  Work with a Social worker or diabetes educator to identify strategies to manage stress and any emotional and social challenges. Questions to ask a health care provider  Do I need to meet with a diabetes educator?  Do I need to meet with a dietitian?  What number can I call if I have questions?  When are the best times to check my blood glucose? Where to find more information:  American Diabetes Association: diabetes.org  Academy of Nutrition and Dietetics: www.eatright.[employer] of Diabetes and Digestive and Kidney Diseases (NIH): [employer] Summary  A healthy meal plan will help you control your blood glucose and maintain a healthy lifestyle.  Working with a diet and nutrition specialist (dietitian) can help you make a meal plan that is best for  you.  Keep in mind that carbohydrates (carbs) and alcohol have immediate effects on your blood glucose levels. It is important to count carbs and to use alcohol carefully. This information is not intended to replace advice given to you by your health care provider. Make sure you discuss any questions you have with your health care provider. Document Released: 04/25/2005 Document Revised: 02/26/2017 Document Reviewed: 09/02/2016 Elsevier Interactive Patient Education  2019 [employer].

## 2018-12-24 NOTE — Progress Notes (Signed)
   Subjective:    Patient ID: Kristin Ward, female    DOB: July 14, 1956, 63 y.o.   MRN: [id number]  HPI Chief Complaint  Patient presents with  . Diabetes    diabetes, BS been running in the 300's today BS was 343.    She is here for a one-week follow-up on new onset diabetes.  She reports blood sugars have improved from the 400 range down to 300.  She is keep an eye on her blood sugar.  She is also taking metformin twice a day and Lantus 10 units daily without any issues.  States she is no longer urinating as often and thirst improving.  States 3 to 4 days ago she developed vaginal itching and discharge.  States she has a yeast infection.  I did send in Diflucan for her and she took this yesterday.  States she has made significant changes to her diet.  Cutting back on sugar and carbohydrates.  She has not yet increased her activity level.  Denies fever, chills, dizziness, headache, chest pain, palpitations, shortness of breath, abdominal pain, nausea, vomiting, diarrhea. States her weight is stable.  Reviewed allergies, medications, past medical, surgical, family, and social history.    Review of Systems Pertinent positives and negatives in the history of present illness.     Objective:   Physical Exam BP 122/80   Pulse 80   Temp 98 F (36.7 C) (Oral)   Resp 16   Wt 239 lb 3.2 oz (108.5 kg)   SpO2 98%   BMI 37.46 kg/m   Alert and oriented and in no acute distress.  Respirations unlabored.  Lower extremities without edema.  Skin is warm and dry.      Assessment & Plan:  New onset type 2 diabetes mellitus ([place]) - Plan: Amb ref to Medical Nutrition Therapy-MNT, atorvastatin (LIPITOR) 20 MG tablet  Chronic combined systolic and diastolic heart failure ([place]) - Plan: Amb ref to Medical Nutrition Therapy-MNT  Essential hypertension - Plan: Amb ref to Medical Nutrition Therapy-MNT  Vaginal itching - Plan: fluconazole (DIFLUCAN) 150 MG tablet  She is here today for a  one-week follow-up regarding new onset diabetes.  Her blood sugars are improving but not as quickly as she would like.  Discussed that metformin may take a few weeks before we know exactly how well it will bring down her sugars. We will increase Lantus from 10 units to 14 units and she is comfortable with this. She is blind and her daughter does help her with her medications and checking her blood sugars. We discussed the possibility of adding a third agent for diabetes however since she is currently having an issue with yeast, and SGLT2 would only make this worse.  Question whether a GLP-1 or a DPP 4 with affordable. We will hold off on third agent for now. She has not been on a statin.  We will start her on atorvastatin today. Referral to MNT.  Encouraged her to continue cutting back on sugar and carbohydrates and increase walking as tolerated. I offered to refer her to an endocrinologist to help get her blood sugars under control and she declines for now. Discussed the importance of good diabetes control in order to prevent worsening heart disease. She will follow-up in 4 weeks and bring in her readings.

## 2018-12-24 NOTE — Telephone Encounter (Signed)
Smartphone/ verbal consent given/ my chart active/ pre reg completed

## 2018-12-26 NOTE — Progress Notes (Signed)
Virtual Visit via Video Note   This visit type was conducted due to national recommendations for restrictions regarding the COVID-19 Pandemic (e.g. social distancing) in an effort to limit this patient's exposure and mitigate transmission in our community.  Due to her co-morbid illnesses, this patient is at least at moderate risk for complications without adequate follow up.  This format is felt to be most appropriate for this patient at this time.  All issues noted in this document were discussed and addressed.  A limited physical exam was performed with this format.  Please refer to the patient's chart for her consent to telehealth for [facility].   Patient has given verbal permission to conduct this visit via virtual appointment and to bill insurance 12/28/2018 8:00 am     Date:  12/28/2018   ID:  Kristin Ward, DOB November 09, 1955, MRN [id number]  Patient Location: Home Provider Location: Home  PCP:  Girtha Rm, NP-C  Cardiologist:  Glenetta Hew, MD  Electrophysiologist:  None   Evaluation Performed:  Follow-Up Visit  Chief Complaint:  Follow up CHF  History of Present Illness:    Kristin Ward is a 63 y.o. female with ongoing assessment and management of CHF, with other history to include breast cancer treated with Cytoxan and Docetaxel & Adjuvant XRT.  Most recent echocardiogram completed August 26, 2017 revealed mild LVH with an EF of 45% to 50%, diffuse hypokinesis, there was no significant valve disease.  She was last seen by Dr. Ellyn Hack on 08/20/2018, and had improvement of symptoms concerning dyspnea, but in the past had run out of her medications which were reinstated and doubled up for short period of time, on the visit with Dr. Ellyn Hack she had gained 7 pounds and he felt that her fluid had built back up.  Her weight was 247 pounds on that office visit.  Due to increased weight with a dry weight of 240 pounds per Dr. Allison Quarry note, Lasix was increased to 80  mg daily for 3 days and then alternate 40 mg 1 day and 80 mg 1 day until she is at a dry weight of roughly 240 pounds at home.  She may take Lasix 1 to 2 tablets if needed for increased weight greater than 240 pounds, with an additional 20 mEq of potassium with each dose.  She was to continue Entresto and carvedilol along with Spironolactone.  She is doing well today. She has been seen by Dr. Raenette Rover, her PCP and has been diagnosed with diabetes. She is now on insulin and metformin. BG has been running in the 330's but is having insulin dose adjusted weekly by PCP.   She is weighing daily and has lost about 5 lbs. She denies edema, DOE, or wheezing. She is medically compliant. She has no cardiac complaints today.   The patient does WYO:VZCH symptoms concerning for COVID-19 infection (fever, chills, cough, or new shortness of breath).    Past Medical History:  Diagnosis Date  . Abnormal x-ray of lungs with single pulmonary nodule 03/15/2016   per records from [place]   . Abnormal x-ray of lungs with single pulmonary nodule 03/15/2016   per records from [place]   . Anemia   . Asthma   . CHF (congestive heart failure) ([place])   . Cholelithiasis 05/19/2017   On CT  . Coronary artery calcification seen on CAT scan 05/19/2017  . Dilated idiopathic cardiomyopathy ([place]) 12/2015   EF 15-20%. Diagnosed in [facility]  . Headache  NONE RECENT  . Hypertension   . Malignant neoplasm of lower-outer quadrant of left breast of female, estrogen receptor negative ([place]) 06/11/2017   left breast  . Mixed hyperlipidemia 06/17/2018  . partially blind   . Personal history of chemotherapy 2018-2019  . Personal history of radiation therapy 2019  . Retinitis pigmentosa   . Solitary pulmonary nodule on lung CT 03/16/2016   per records from [place]  . Uveitis    Past Surgical History:  Procedure Laterality Date  . brain cyst removed  2007   to help with headaches per patient , aspirated    . BREAST BIOPSY Left 06/06/2017   x2  . BREAST CYST ASPIRATION Left 06/06/2017  . BREAST LUMPECTOMY Left 06/26/2017  . BREAST LUMPECTOMY WITH RADIOACTIVE SEED AND SENTINEL LYMPH NODE BIOPSY Left 06/26/2017   Procedure: BREAST LUMPECTOMY WITH RADIOACTIVE SEED AND SENTINEL LYMPH NODE BIOPSY ERAS  PATHWAY;  Surgeon: [name], MD;  Location: [place];  Service: General;  Laterality: Left;  pec block  . NASAL TURBINATE REDUCTION    . PORTACATH PLACEMENT N/A 06/26/2017   Procedure: INSERTION PORT-A-CATH;  Surgeon: [name], MD;  Location: [place];  Service: General;  Laterality: N/A;  . TRANSTHORACIC ECHOCARDIOGRAM  12/2015   A) [facility] May 2017: Mild concentric LVH. Global hypokinesis. GR daily. EF 18% severe LA dilation. Mitral annular dilatation with papillary muscle dysfunction and moderate MR. Dilated IVC consistent with elevated RAP.  Marland Kitchen TRANSTHORACIC ECHOCARDIOGRAM  05/2017; 08/2017   a) Mild concentric LVH.  EF 45 to 50% with diffuse hypokinesis.  GR 1 DD.  Mild aortic root dilation.;; b)  Mild LVH EF 45 to 50%.  Diffuse HK.  No significant valve disease.  No significant change.     Current Meds  Medication Sig  . Accu-Chek FastClix Lancets MISC Test twice a day. Pt uses an accu-chek guide me meter  . albuterol (PROVENTIL HFA;VENTOLIN HFA) 108 (90 Base) MCG/ACT inhaler Inhale 2 puffs into the lungs every 6 (six) hours as needed for wheezing or shortness of breath.  Marland Kitchen aspirin 81 MG chewable tablet Chew 81 mg by mouth daily.  Marland Kitchen atorvastatin (LIPITOR) 20 MG tablet Take 1 tablet (20 mg total) by mouth daily.  . carvedilol (COREG) 12.5 MG tablet Take 1 tablet (12.5 mg total) by mouth 2 (two) times daily with a meal.  . furosemide (LASIX) 40 MG tablet TAKE 40 MG TABLET DAILY , MAY AN TAKE AN ADDITIONAL 40 MG IF WEIGHT GAIN IS MORE THAN 3 LBS OVERNIGHT  . gabapentin (NEURONTIN) 300 MG capsule Take 1 capsule (300 mg total) by mouth 3 (three) times daily.  Marland Kitchen glucose blood (ACCU-CHEK  GUIDE) test strip Test twice a day. Pt uses a accu-guide me meter  . Insulin Glargine (BASAGLAR KWIKPEN) 100 UNIT/ML SOPN Inject 0.1 mLs (10 Units total) into the skin daily. (Patient taking differently: Inject 14 Units into the skin daily. )  . loratadine (CLARITIN) 10 MG tablet Take 1 tablet (10 mg total) by mouth daily.  . metFORMIN (GLUCOPHAGE) 1000 MG tablet Take 1 tablet (1,000 mg total) by mouth 2 (two) times daily with a meal.  . potassium chloride SA (K-DUR,KLOR-CON) 20 MEQ tablet Take 20 meq tablet when taking an extra lasix tablet daily as needed  . sacubitril-valsartan (ENTRESTO) 49-51 MG Take 1 tablet by mouth 2 (two) times daily.  Marland Kitchen spironolactone (ALDACTONE) 25 MG tablet Take 1 tablet (25 mg total) by mouth daily.  . vitamin C (ASCORBIC ACID) 500 MG  tablet Take 1,000 mg 2 (two) times daily by mouth.     Allergies:   Latex; Tylenol with codeine #3 [acetaminophen-codeine]; and Penicillins   Social History   Tobacco Use  . Smoking status: Former Smoker    Packs/day: 0.25    Years: 23.00    Pack years: 5.75    Types: Cigarettes    Last attempt to quit: 10/09/2015    Years since quitting: 3.2  . Smokeless tobacco: Never Used  Substance Use Topics  . Alcohol use: No  . Drug use: No     Family Hx: The patient's family history includes Breast cancer (age of onset: 9) in an other family member; Colon cancer (age of onset: 5) in her mother; Fibromyalgia in her sister; Heart attack in her father; Pancreatic cancer in an other family member. There is no history of Esophageal cancer, Rectal cancer, or Stomach cancer.  ROS:   Please see the history of present illness.     All other systems reviewed and are negative.   Prior CV studies:   The following studies were reviewed today:  Echocardiogram 08/26/2017 Left ventricle: The cavity size was normal. Wall thickness was   increased in a pattern of mild LVH. Systolic function was mildly   reduced. The estimated ejection  fraction was in the range of 45%   to 50%. Diffuse hypokinesis. Doppler parameters are consistent   with abnormal left ventricular relaxation (grade 1 diastolic   dysfunction). - Aortic valve: There was no stenosis. - Mitral valve: Mildly calcified annulus. Normal thickness leaflets   . There was trivial regurgitation. - Right ventricle: The cavity size was normal. Systolic function   was normal. - Tricuspid valve: Peak RV-RA gradient (S): 14 mm Hg. - Pulmonary arteries: PA peak pressure: 17 mm Hg (S). - Inferior vena cava: The vessel was normal in size. The   respirophasic diameter changes were in the normal range (>= 50%),   consistent with normal central venous pressure.  Impressions:  - Normal LV size with mild LV hypertrophy. EF 45-50%, diffuse   hypokinesis. Normal RV size and systolic function. No significant   valvular abnormalities.  Labs/Other Tests and Data Reviewed:    EKG:  No ECG reviewed.  Recent Labs: 12/17/2018: ALT 14; BUN 7; Creatinine, Ser 0.85; Hemoglobin 12.9; Platelets 301; Potassium 4.6; Sodium 138; TSH 3.920   Recent Lipid Panel Lab Results  Component Value Date/Time   CHOL 176 12/17/2018 09:00 AM   TRIG 220 (H) 12/17/2018 09:00 AM   HDL 29 (L) 12/17/2018 09:00 AM   CHOLHDL 6.1 (H) 12/17/2018 09:00 AM   CHOLHDL 4.2 03/20/2017 09:43 AM   LDLCALC 103 (H) 12/17/2018 09:00 AM    Wt Readings from Last 3 Encounters:  12/28/18 235 lb (106.6 kg)  12/24/18 239 lb 3.2 oz (108.5 kg)  12/17/18 239 lb 12.8 oz (108.8 kg)     Objective:    Vital Signs:  BP 120/90   Pulse 71   Ht 5\' 6"  (1.676 m)   Wt 235 lb (106.6 kg)   BMI 37.93 kg/m    VITAL SIGNS:  reviewed  Awake alert oriented, no acute distress Vision is decreased does not look into camera. Hearing intact.  No evidence of dyspnea when she walks in her home No edema of the lower extremities.   ASSESSMENT & PLAN:    1. Chronic Combined Systolic and Diastolic CHF: Weight is well maintained  with no acute symptoms. She is monitoring her salt intake and  fluids. She is walking everyday. Used to go to the gym but stopped due to [place] closings. She will continue current medication regimen. She is seeing PCP monthly for labs, and therefore will not order today.   2. Newly diagnosed IDDM: Followed closely by PCP for insulin dosing as her BG at home is still elevated into the 300's.   3. Hypertension: Currently well controlled on coreg and spironolactone. Labs are planned by PCP this month. Will follow.    COVID-19 Education: The signs and symptoms of COVID-19 were discussed with the patient and how to seek care for testing (follow up with PCP or arrange E-visit).  The importance of social distancing was discussed today.  Time:   Today, I have spent 20 minutes with the patient with telehealth technology discussing the above problems.     Medication Adjustments/Labs and Tests Ordered: Current medicines are reviewed at length with the patient today.  Concerns regarding medicines are outlined above.   Tests Ordered: No orders of the defined types were placed in this encounter.   Medication Changes: No orders of the defined types were placed in this encounter.   Disposition:  Follow up September, 2020   Marland Kitchen. [place] Pugh, ANP, AACC  12/28/2018 8:52 AM    [facility]

## 2018-12-28 ENCOUNTER — Encounter: Payer: Self-pay | Admitting: Adult Health

## 2018-12-28 ENCOUNTER — Telehealth (INDEPENDENT_AMBULATORY_CARE_PROVIDER_SITE_OTHER): Payer: Medicare HMO | Admitting: Adult Health

## 2018-12-28 VITALS — BP 120/90 | HR 71 | Ht 66.0 in | Wt 235.0 lb

## 2018-12-28 DIAGNOSIS — I5042 Chronic combined systolic (congestive) and diastolic (congestive) heart failure: Secondary | ICD-10-CM

## 2018-12-28 DIAGNOSIS — I10 Essential (primary) hypertension: Secondary | ICD-10-CM

## 2018-12-28 DIAGNOSIS — IMO0001 Reserved for inherently not codable concepts without codable children: Secondary | ICD-10-CM

## 2018-12-28 MED ORDER — FUROSEMIDE 40 MG PO TABS: 40.0000 mg | ORAL_TABLET | Freq: Every day | ORAL | 1 refills | Status: DC

## 2018-12-28 MED ORDER — SPIRONOLACTONE 25 MG PO TABS: 25.0000 mg | ORAL_TABLET | Freq: Every day | ORAL | 1 refills | Status: DC

## 2018-12-28 MED ORDER — SACUBITRIL-VALSARTAN 49-51 MG PO TABS: 1.0000 | ORAL_TABLET | Freq: Two times a day (BID) | ORAL | 6 refills | Status: DC

## 2018-12-28 MED ORDER — CARVEDILOL 12.5 MG PO TABS: 12.5000 mg | ORAL_TABLET | Freq: Two times a day (BID) | ORAL | 1 refills | Status: DC

## 2018-12-28 NOTE — Patient Instructions (Signed)
Medication Instructions:  Your physician recommends that you continue on your current medications as directed. Please refer to the Current Medication list given to you today.  If you need a refill on your cardiac medications before your next appointment, please call your pharmacy.   Follow-Up: At [facility], you and your health needs are our priority.  As part of our continuing mission to provide you with exceptional heart care, we have created designated Provider Care Teams.  These Care Teams include your primary Cardiologist (physician) and Advanced Practice Providers (APPs -  Physician Assistants and Nurse Practitioners) who all work together to provide you with the care you need, when you need it. . You have been scheduled for a follow-up appointment with Jory Sims, NP on Friday, 05/07/19 at 10 AM.  Any Other Special Instructions Will Be Listed Below (If Applicable). Continue your current walking regimen.   Daily Weight Record: It is important to weigh yourself daily. Keep your daily weight chart near your scale. Weigh yourself each morning at the same time. Weigh yourself without shoes, and try to wear the same amount of clothing each day. Compare today's weight to yesterday's weight.

## 2018-12-28 NOTE — Progress Notes (Signed)
Pt was notified to increase her insulin from 14 units to 18 units and call us next week

## 2018-12-28 NOTE — Progress Notes (Signed)
Please have her increase her insulin from 14 units to 18 units since her blood sugars are still over 300. I read this in her virtual visit note from cardiology. Call me next week to let me know what her blood sugars are running please.

## 2018-12-28 NOTE — Addendum Note (Signed)
Addended by: Therisa Doyne on: 12/28/2018 10:18 AM   Modules accepted: Orders

## 2019-01-06 ENCOUNTER — Telehealth: Payer: Self-pay

## 2019-01-06 ENCOUNTER — Other Ambulatory Visit: Payer: Self-pay | Admitting: *Deleted

## 2019-01-06 NOTE — Telephone Encounter (Signed)
Patient has questions vaginal itch.  Please call her back [phone]

## 2019-01-06 NOTE — Telephone Encounter (Signed)
Pt's sugars are 350's. Vaginal itching still going on. Per vickie advised to up to 22 units and come in Friday for an appt

## 2019-01-06 NOTE — Telephone Encounter (Signed)
Please call her and find out how her blood sugars are.  She may still be peeing out quite a bit of sugar.  Is she having urinary frequency or urgency pain with urination?  She may need to be seen for this problem.

## 2019-01-06 NOTE — Telephone Encounter (Signed)
Vaginal itching took 2 pills but it has yet to go away. She is now having side pain that has moved to back pain. Just started in the last couple days. No other symptoms. Please advise.

## 2019-01-08 ENCOUNTER — Ambulatory Visit (INDEPENDENT_AMBULATORY_CARE_PROVIDER_SITE_OTHER): Payer: Medicare HMO | Admitting: Family Medicine

## 2019-01-08 ENCOUNTER — Other Ambulatory Visit: Payer: Self-pay

## 2019-01-08 ENCOUNTER — Encounter: Payer: Self-pay | Admitting: Family Medicine

## 2019-01-08 VITALS — BP 122/72 | HR 86 | Temp 98.1°F | Resp 16 | Wt 230.6 lb

## 2019-01-08 DIAGNOSIS — B373 Candidiasis of vulva and vagina: Secondary | ICD-10-CM

## 2019-01-08 DIAGNOSIS — E1165 Type 2 diabetes mellitus with hyperglycemia: Secondary | ICD-10-CM

## 2019-01-08 DIAGNOSIS — B9689 Other specified bacterial agents as the cause of diseases classified elsewhere: Secondary | ICD-10-CM | POA: Diagnosis not present

## 2019-01-08 DIAGNOSIS — N760 Acute vaginitis: Secondary | ICD-10-CM | POA: Diagnosis not present

## 2019-01-08 DIAGNOSIS — E119 Type 2 diabetes mellitus without complications: Secondary | ICD-10-CM

## 2019-01-08 DIAGNOSIS — N898 Other specified noninflammatory disorders of vagina: Secondary | ICD-10-CM | POA: Diagnosis not present

## 2019-01-08 DIAGNOSIS — R81 Glycosuria: Secondary | ICD-10-CM | POA: Diagnosis not present

## 2019-01-08 DIAGNOSIS — H543 Unqualified visual loss, both eyes: Secondary | ICD-10-CM

## 2019-01-08 DIAGNOSIS — B3731 Acute candidiasis of vulva and vagina: Secondary | ICD-10-CM

## 2019-01-08 DIAGNOSIS — I5042 Chronic combined systolic (congestive) and diastolic (congestive) heart failure: Secondary | ICD-10-CM

## 2019-01-08 DIAGNOSIS — M545 Low back pain, unspecified: Secondary | ICD-10-CM

## 2019-01-08 DIAGNOSIS — E782 Mixed hyperlipidemia: Secondary | ICD-10-CM | POA: Diagnosis not present

## 2019-01-08 DIAGNOSIS — C50512 Malignant neoplasm of lower-outer quadrant of left female breast: Secondary | ICD-10-CM

## 2019-01-08 DIAGNOSIS — Z171 Estrogen receptor negative status [ER-]: Secondary | ICD-10-CM

## 2019-01-08 LAB — POCT WET PREP (WET MOUNT)
Clue Cells Wet Prep Whiff POC: NEGATIVE
Trichomonas Wet Prep HPF POC: ABSENT

## 2019-01-08 LAB — POCT URINALYSIS DIP (PROADVANTAGE DEVICE)
Bilirubin, UA: NEGATIVE
Glucose, UA: >=1000 mg/dL — AB
Ketones, POC UA: NEGATIVE mg/dL
Leukocytes, UA: NEGATIVE
Nitrite, UA: NEGATIVE
Protein Ur, POC: 30 mg/dL — AB
Specific Gravity, Urine: 1.025
Urobilinogen, Ur: NEGATIVE
pH, UA: 6 (ref 5.0–8.0)

## 2019-01-08 MED ORDER — METRONIDAZOLE 500 MG PO TABS: 500.0000 mg | ORAL_TABLET | Freq: Two times a day (BID) | ORAL | 0 refills | Status: DC

## 2019-01-08 MED ORDER — FLUCONAZOLE 150 MG PO TABS: 150.0000 mg | ORAL_TABLET | Freq: Once | ORAL | 0 refills | Status: AC

## 2019-01-08 NOTE — Progress Notes (Signed)
Subjective:    Patient ID: Kristin Ward, female    DOB: Oct 21, 1955, 63 y.o.   MRN: [id number]  HPI Chief Complaint  Patient presents with  . vaginal issues    vaginal issues and bs- running between 320-390. 22 units basaglar and back pain   She is a 63 year old female with a history of blindness, breast cancer, CHF, HTN, HL, obesity and recently diagnosed with diabetes.  Complains of vaginal itching for the past 3 weeks. Denies lesions, vaginal discharge or odor. She thought she had a yeast infection so she was treated with Diflucan x 2 doses. She has not noticed improvement.  Denies being sexually active for the past 3 years. States Dr. 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 Denies having genital herpes.   Denies fever, chills, dizziness, chest pain, palpitations, shortness of breath, abdominal pain, N/V/D, urinary symptoms, LE edema.   New onset diabetes and we have not been able to get her blood sugars down. Continues to have blood sugars in the 300s. Currently she is on Metformin 1,000 mg bid and Basaglar 22 units. We have been titrating insulin dose. We have discussed using other medications but have not started her on anything new. A GLP1 is most likely not affordable and an SGLT-2 is not advisable since she still has a considerable amount of glucose in her urine.   Reviewed allergies, medications, past medical, surgical, family, and social history.   Review of Systems Pertinent positives and negatives in the history of present illness.     Objective:   Physical Exam Exam conducted with a chaperone present.  Constitutional:      General: She is not in acute distress.    Appearance: She is not ill-appearing.  Neck:     Musculoskeletal: Normal range of motion and neck supple.  Cardiovascular:     Rate and Rhythm: Normal rate and regular rhythm.     Pulses: Normal pulses.  Pulmonary:     Effort: Pulmonary effort is normal.     Breath  sounds: Normal breath sounds.  Abdominal:     General: Abdomen is flat.     Palpations: Abdomen is soft.  Genitourinary:    General: Normal vulva.     Pubic Area: No rash.      Labia:        Right: No tenderness or lesion.        Left: No tenderness or lesion.      Vagina: Vaginal discharge present. No tenderness or lesions.     Comments: White thick discharge  Musculoskeletal:     Comments: Normal sensation and motion of her back. Normal gait  Neurological:     Mental Status: She is alert.    BP 122/72   Pulse 86   Temp 98.1 F (36.7 C) (Oral)   Resp 16   Wt 230 lb 9.6 oz (104.6 kg)   SpO2 98%   BMI 37.22 kg/m       Assessment & Plan:  Vaginal candidiasis - Plan: fluconazole (DIFLUCAN) 150 MG tablet, POCT Wet Prep ([place])  New onset type 2 diabetes mellitus ([place])  Mixed hyperlipidemia - Plan: Lipid Panel  Vaginal itching - Plan: POCT Urinalysis DIP (Proadvantage Device)  BV (bacterial vaginosis) - Plan: metroNIDAZOLE (FLAGYL) 500 MG tablet, POCT Wet Prep ([place])  Type 2 diabetes mellitus with hyperglycemia, unspecified whether long term insulin use (HCC) - Plan: POCT Urinalysis DIP (Proadvantage Device), Ambulatory referral  to Endocrinology  Acute bilateral low back pain without sciatica  Chronic combined systolic and diastolic heart failure (HCC)  Blind in both eyes  Malignant neoplasm of lower-outer quadrant of left breast of female, estrogen receptor negative ([place])  Glucosuria - Plan: POCT Urinalysis DIP (Proadvantage Device)  Wet prep +BV, yeast Treat with metronidazole and advised to avoid alcohol.  I will also treat her with Diflucan. Discussed that until we get her blood sugars down and reduce the amount of glucose in her urine that she will most likely have issues with this.  She will take the Valtrex prescribed by her OB/GYN Dr. Hulan Fray. No sign of herpetic lesions today.  Back pain- no sciatica. She reports improvement in pain. Continue with  supportive care including Tylenol, ice or heat and let me know if this is worsening.  BP is in goal range.  Diabetes uncontrolled. Referral to endocrinology for more aggressive therapy recommendations. It is very important that her BSs be under control due to complex cardiac history.  She is on Entresto and atorvastatin.  Follow up June 10th as scheduled.

## 2019-01-08 NOTE — Patient Instructions (Signed)
Take the metronidazole antibiotic as prescribed.  Do not drink alcohol with this medication.  Take the Diflucan for yeast as well.  Take the Valtrex as Dr. Hulan Fray prescribed.  Continue on your other medications.  You should receive a call from [facility] endocrinology to schedule a visit for your diabetes.

## 2019-01-14 ENCOUNTER — Telehealth: Payer: Self-pay | Admitting: Internal Medicine

## 2019-01-14 NOTE — Telephone Encounter (Signed)
I referred her to endocrinology for diabetes. Ok to do virtual visit with me. I am concerned that she may not be injecting the insulin correctly. Can someone please discuss this with her and find out when she is seeing an endocrinologist.

## 2019-01-14 NOTE — Telephone Encounter (Signed)
Called pt about virtual vs in office. Pt states her vaginal issues is better but her BS is 320 or higher everyday. Do you still want to do a telephone call or do you still want to see her in person.

## 2019-01-15 NOTE — Telephone Encounter (Signed)
I have contacted office and waiting for a call back. They are slammed today so after hours line picked up call and will forward the message

## 2019-01-15 NOTE — Telephone Encounter (Signed)
[place] like she is injecting insulin correctly. If you can, please check with the endo office. I believe I asked for an urgent referral. Thanks.

## 2019-01-15 NOTE — Telephone Encounter (Signed)
Pt states that she is injecting and holding down button for 10 seconds. She can feel med go in. Endocrinology has not called her yet. Endocrinology specialist can take a while as provider as to give approval before scheduling

## 2019-01-15 NOTE — Telephone Encounter (Signed)
They no longer have a referral coordinator at this time but referrals are being worked today in their [place] so they hope to try to get to it today.

## 2019-01-20 ENCOUNTER — Ambulatory Visit (INDEPENDENT_AMBULATORY_CARE_PROVIDER_SITE_OTHER): Payer: Medicare HMO | Admitting: Family Medicine

## 2019-01-20 ENCOUNTER — Encounter: Payer: Self-pay | Admitting: Family Medicine

## 2019-01-20 ENCOUNTER — Other Ambulatory Visit: Payer: Self-pay

## 2019-01-20 VITALS — BP 126/87 | Wt 227.0 lb

## 2019-01-20 DIAGNOSIS — N898 Other specified noninflammatory disorders of vagina: Secondary | ICD-10-CM

## 2019-01-20 DIAGNOSIS — E119 Type 2 diabetes mellitus without complications: Secondary | ICD-10-CM | POA: Insufficient documentation

## 2019-01-20 DIAGNOSIS — I10 Essential (primary) hypertension: Secondary | ICD-10-CM | POA: Diagnosis not present

## 2019-01-20 DIAGNOSIS — H543 Unqualified visual loss, both eyes: Secondary | ICD-10-CM | POA: Diagnosis not present

## 2019-01-20 DIAGNOSIS — I5042 Chronic combined systolic (congestive) and diastolic (congestive) heart failure: Secondary | ICD-10-CM | POA: Diagnosis not present

## 2019-01-20 HISTORY — DX: Type 2 diabetes mellitus without complications: E11.9

## 2019-01-20 MED ORDER — LINAGLIPTIN 5 MG PO TABS: 5.0000 mg | ORAL_TABLET | Freq: Every day | ORAL | 0 refills | Status: DC

## 2019-01-20 NOTE — Progress Notes (Signed)
Subjective:  Documentation for virtual telephone encounter. She does not have access to video.   The patient was located at home. The provider was located in the office. The patient did consent to this visit and is aware of possible charges through their insurance for this visit.  The other persons participating in this telemedicine service were none.    Patient ID: Kristin Ward, female    DOB: 04/29/1956, 63 y.o.   MRN: [id number]  HPI Chief Complaint  Patient presents with  . 4 week follow-up    4 week follow-up. ran out of basaglar yesterday and only go get 20 units instead of 24 units. bs yesterday 294, today bs 376.    This is a 4-week follow-up on uncontrolled diabetes.  Diabetes is a new diagnosis for her.  Her blood sugars are gradually improving but still not where we would like. Currently she reports good medication compliance with metformin 1000 mg twice daily and Basaglar 24 units daily. States she ran out of Basaglar yesterday and was only able to give herself 20 units.  She is requesting samples. She is blind and has difficulty going to her pharmacy.  BS has improved somewhat.  States her blood sugar is now in the 200s when eating healthier and walking more.  She says it bounced back up to the 300 range when she does not eat a healthy diet. States she has a Programmer, applications.   No longer having polyuria or polydipsia.  She continues having vaginal itching but this has improved significantly.  Denies fever, chills, dizziness, chest pain, palpitations, shortness of breath, abdominal pain, N/V/D, urinary symptoms, LE edema.   Reviewed allergies, medications, past medical, surgical, family, and social history.    Review of Systems Pertinent positives and negatives in the history of present illness.     Objective:   Physical Exam Wt 227 lb (103 kg)   BMI 36.64 kg/m   Alert and oriented and in no acute distress.  Normal speech, mood and  thought process.  Unable to do a physical exam due to this being a virtual visit.      Assessment & Plan:  Diabetes mellitus, new onset ([place]) - Plan: linagliptin (TRADJENTA) 5 MG TABS tablet  Essential hypertension  Chronic combined systolic and diastolic heart failure (HCC)  Blind in both eyes  Vaginal itching  Discussed limitations of a virtual visit. She does not appear to be in any acute distress. She is seeing some improvement in blood sugars but not to the level that I would like.  She is a aware of how her diet and activity level affect her blood sugars. She will see the nutritionist tomorrow. She ran out of Basaglar yesterday and is requesting samples.  I will provide her samples and she will come by to pick these up later today.  Recommend 25 units of Basaglar daily. We considered switching her to [place] to see if she would see better results but she does not want to switch. She will hopefully see an endocrinologist soon.  She will continue on metformin twice daily.  She is not having any side effects. New start of Tradjenta today.  Samples will be provided. Not a candidate for a SGLT-2 due to chronic yeast infections.  I would like for her to see the endocrinologist as soon as possible.  Advised her that they attempted to call her.  She agrees to call them once we and our telephone conversation. Blood pressure appears to  be in goal range.  Weight is stable. Followed by cardiology and oncology. I will see her in 4 weeks.   Time spent on call was 22 minutes and in review of previous records 4 minutes total.  This virtual service is not related to other E/M service within previous 7 days.  99441 (5-43min) 99442 (11-79min) 99443 (21-52min)

## 2019-01-20 NOTE — Patient Instructions (Signed)
Continue on the twice daily metformin.  Take 25 units of the Basaglar daily.  I am giving you samples today.  Start the new medication called Tradjenta for your diabetes.  This is a once daily dose.  I am also giving you samples today.  I expect that you will be seeing the endocrinologist in the next couple of weeks.  You may want to discuss with them a different type of meter or glucose monitoring system to help you with checking your blood sugars since you have blindness.  Follow-up with me in 4 weeks.

## 2019-01-21 ENCOUNTER — Encounter: Payer: Medicare HMO | Attending: Family Medicine | Admitting: Dietician

## 2019-01-21 ENCOUNTER — Encounter: Payer: Self-pay | Admitting: Dietician

## 2019-01-21 DIAGNOSIS — E119 Type 2 diabetes mellitus without complications: Secondary | ICD-10-CM | POA: Insufficient documentation

## 2019-01-21 NOTE — Progress Notes (Signed)
Diabetes Self-Management Education  Visit Type: First/Initial  Appt. Start Time: 11:00am Appt. End Time: 11:55am  01/21/2019  Kristin Ward, identified by name and date of birth, is a 63 y.o. female with a diagnosis of Diabetes: Type 2.    ASSESSMENT  Pt has blindness in both eyes. She arrived today with her daughter who assisted her with reading, writing, and walking. They rely on public transportation, so the appointment was cut Reggie Welge today so they could catch the bus.   Pt states she has drastically changed her diet since she found out she has diabetes about a month ago. Pt states she used to eat a lot more, especially at nighttime, and drink more sodas. Used to eat meat, but currently would like to follow a more plant-based diet, specifically vegetarian (no eggs or dairy either.) Pt states she snacks a lot throughout the day, therefore often is not very hungry for dinner. Daughter states she helps pt check her blood sugar when she can. Pt usually wakes up early (4am) and tries not to eat anything until her daughter wakes up and checks her blood sugar. Will snack on fruit until noon when she has lunch.   Diabetes Self-Management Education - 01/21/19 1112      Visit Information   Visit Type  First/Initial      Initial Visit   Diabetes Type  Type 2    Are you currently following a meal plan?  No    Are you taking your medications as prescribed?  Yes    Date Diagnosed  May 2020      Health Coping   How would you rate your overall health?  Fair      Psychosocial Assessment   Patient Belief/Attitude about Diabetes  Other (comment)   Horrible   Self-care barriers  Impaired vision    Self-management support  Family;Doctor's office    Other persons present  Patient;Family Member   daughter   Patient Concerns  Nutrition/Meal planning;Glycemic Control    Special Needs  Verbal instruction   blindness in both eyes   Preferred Learning Style  No preference indicated    Learning  Readiness  Contemplating    How often do you need to have someone help you when you read instructions, pamphlets, or other written materials from your doctor or pharmacy?  5 - Always    What is the last grade level you completed in school?  Bachelor      Pre-Education Assessment   Patient understands the diabetes disease and treatment process.  Needs Instruction    Patient understands incorporating nutritional management into lifestyle.  Needs Instruction    Patient undertands incorporating physical activity into lifestyle.  Needs Instruction    Patient understands using medications safely.  Needs Instruction    Patient understands monitoring blood glucose, interpreting and using results  Needs Instruction    Patient understands prevention, detection, and treatment of acute complications.  Needs Instruction    Patient understands prevention, detection, and treatment of chronic complications.  Needs Instruction    Patient understands how to develop strategies to address psychosocial issues.  Needs Instruction    Patient understands how to develop strategies to promote health/change behavior.  Needs Instruction      Complications   Last HgB A1C per patient/outside source  13 %    How often do you check your blood sugar?  3-4 times/day    Fasting Blood glucose range (mg/dL)  >200   290 - 300s  Postprandial Blood glucose range (mg/dL)  >200   300-500s   Have you had a dilated eye exam in the past 12 months?  No    Have you had a dental exam in the past 12 months?  No    Are you checking your feet?  Yes    How many days per week are you checking your feet?  7      Dietary Intake   Breakfast  banana + lemon water    Snack (morning)  skips    Lunch  vegetarian sandwich (1 slice multigrain bread)    Snack (afternoon)  fruit + popcorn    Dinner  Subway (or Chipotle)    Beverage(s)  soda, apple juice, water with lemon      Exercise   Exercise Type  ADL's    How many days per week to you  exercise?  2    How many minutes per day do you exercise?  60    Total minutes per week of exercise  120      Patient Education   Previous Diabetes Education  No    Disease state   Definition of diabetes, type 1 and 2, and the diagnosis of diabetes;Explored patient's options for treatment of their diabetes;Factors that contribute to the development of diabetes    Nutrition management   Role of diet in the treatment of diabetes and the relationship between the three main macronutrients and blood glucose level;Reviewed blood glucose goals for pre and post meals and how to evaluate the patients' food intake on their blood glucose level.    Physical activity and exercise   Role of exercise on diabetes management, blood pressure control and cardiac health.    Medications  Other (comment)   Refer to PCP/endocrinologist for instrucitons on medication management   Monitoring  Purpose and frequency of SMBG.;Identified appropriate SMBG and/or A1C goals.;Daily foot exams    Acute complications  Discussed and identified patients' treatment of hyperglycemia.;Taught treatment of hypoglycemia - the 15 rule.      Individualized Goals (developed by patient)   Nutrition  General guidelines for healthy choices and portions discussed    Physical Activity  Exercise 1-2 times per week    Medications  take my medication as prescribed    Monitoring   test my blood glucose as discussed    Reducing Risk  do foot checks daily;increase portions of nuts and seeds;Other (comment)   Spread carbohydrate intake throughout the day. Reduce intake of simple sugars.   Health Coping  ask for help with (comment)      Post-Education Assessment   Patient understands the diabetes disease and treatment process.  Demonstrates understanding / competency    Patient understands incorporating nutritional management into lifestyle.  Needs Review    Patient undertands incorporating physical activity into lifestyle.  Needs Review     Patient understands using medications safely.  Needs Review    Patient understands monitoring blood glucose, interpreting and using results  Needs Review    Patient understands prevention, detection, and treatment of acute complications.  Needs Instruction    Patient understands prevention, detection, and treatment of chronic complications.  Needs Instruction    Patient understands how to develop strategies to address psychosocial issues.  Needs Instruction    Patient understands how to develop strategies to promote health/change behavior.  Needs Instruction      Outcomes   Expected Outcomes  Demonstrated interest in learning. Expect positive outcomes    Future  DMSE  4-6 wks    Program Status  Not Completed       Individualized Plan for Diabetes Self-Management Training:  Learning Objective:  Patient will have a greater understanding of diabetes self-management. Patient education plan is to attend individual and/or group sessions per assessed needs and concerns.   Plan:  Expected Outcomes:  Demonstrated interest in learning. Expect positive outcomes  Education material provided: ADA - How to Thrive: A Guide for Your Journey with Diabetes, A1C conversion sheet, My Plate and Snack sheet   RD's Notes for Next Visit:  - Address plant based eating, per pt request (especially plant protein)  - Sugary Drinks handout  If problems or questions, patient to contact team via:  Phone and Email  Future DSME appointment: 4-6 wks

## 2019-01-27 ENCOUNTER — Telehealth: Payer: Self-pay | Admitting: Internal Medicine

## 2019-01-27 ENCOUNTER — Telehealth: Payer: Self-pay | Admitting: Hematology

## 2019-01-27 ENCOUNTER — Other Ambulatory Visit: Payer: Self-pay

## 2019-01-27 ENCOUNTER — Encounter: Payer: Self-pay | Admitting: Internal Medicine

## 2019-01-27 ENCOUNTER — Ambulatory Visit (INDEPENDENT_AMBULATORY_CARE_PROVIDER_SITE_OTHER): Payer: Medicare HMO | Admitting: Internal Medicine

## 2019-01-27 VITALS — BP 102/70 | HR 82 | Temp 98.1°F | Ht 67.25 in | Wt 230.4 lb

## 2019-01-27 DIAGNOSIS — H543 Unqualified visual loss, both eyes: Secondary | ICD-10-CM | POA: Diagnosis not present

## 2019-01-27 DIAGNOSIS — E119 Type 2 diabetes mellitus without complications: Secondary | ICD-10-CM | POA: Diagnosis not present

## 2019-01-27 MED ORDER — PRODIGY VOICE BLOOD GLUCOSE W/DEVICE KIT: 1.0000 | PACK | 0 refills | Status: DC

## 2019-01-27 MED ORDER — NOVOLOG MIX 70/30 FLEXPEN (70-30) 100 UNIT/ML ~~LOC~~ SUPN: 24.0000 [IU] | PEN_INJECTOR | Freq: Two times a day (BID) | SUBCUTANEOUS | 11 refills | Status: DC

## 2019-01-27 MED ORDER — GLUCOSE BLOOD VI STRP: ORAL_STRIP | 12 refills | Status: DC

## 2019-01-27 NOTE — Telephone Encounter (Signed)
Not able to reach patient re converting 6/22 f/u to phone visit.  Left message asking patient to call me re f/u.

## 2019-01-27 NOTE — Patient Instructions (Signed)
-   STOP Basaglar  - STOP Tradjenta - Continue Metformin 1000 mg Twice a day with meals - Take Novolog Mix 24 units with Breakfast and Supper     Choose healthy, lower carb lower calorie snacks: toss salad, cooked vegetables, cottage cheese, peanut butter, low fat cheese / string cheese, lower sodium deli meat, tuna salad or chicken salad     HOW TO TREAT LOW BLOOD SUGARS (Blood sugar LESS THAN 70 MG/DL)  Please follow the RULE OF 15 for the treatment of hypoglycemia treatment (when your (blood sugars are less than 70 mg/dL)    STEP 1: Take 15 grams of carbohydrates when your blood sugar is low, which includes:   3-4 GLUCOSE TABS  OR  3-4 OZ OF JUICE OR REGULAR SODA OR  ONE TUBE OF GLUCOSE GEL     STEP 2: RECHECK blood sugar in 15 MINUTES STEP 3: If your blood sugar is still low at the 15 minute recheck --> then, go back to STEP 1 and treat AGAIN with another 15 grams of carbohydrates.

## 2019-01-27 NOTE — Progress Notes (Signed)
Name: Kristin Ward  MRN/ DOB: [id number], 10-15-1955   Age/ Sex: 63 y.o., female    PCP: Girtha Rm, NP-C   Reason for Endocrinology Evaluation: Type 2 Diabetes Mellitus     Date of Initial Endocrinology Visit: 01/27/2019     PATIENT IDENTIFIER: Kristin Ward is a 63 y.o. female with a past medical history of T2DM, HTN, retinitis pigmentosa and macular degeneration and CHF. The patient presented for initial endocrinology clinic visit on 01/27/2019 for consultative assistance with her diabetes management.    HPI: Kristin Ward is with her daughter Kristin Ward  Diagnosed with T2DM in 12/2018, had polydipsia and polyuria Currently checking blood sugars 2-3 x / day,  before breakfast and   Hypoglycemia episodes : no             Hemoglobin A1c 13.0 % in 12/2018 no prior records. Patient required assistance for hypoglycemia: no Patient has required hospitalization within the last 1 year from hyper or hypoglycemia: no  In terms of diet, the patient has reduced her sugar-sweetened beverages, 2 meals, snacks a lot .    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 Abnormal x-ray of lungs with single pulmonary nodule 03/15/2016   per records from [place]   . Abnormal x-ray of lungs with single pulmonary nodule 03/15/2016   per records from [place]   . Anemia   . Asthma   . CHF (congestive heart failure) ([place])   . Cholelithiasis 05/19/2017   On CT  . Coronary artery calcification seen on CAT  scan 05/19/2017  . Dilated idiopathic cardiomyopathy ([place]) 12/2015   EF 15-20%. Diagnosed in [facility]  . Headache    NONE RECENT  . Hypertension   . Malignant neoplasm of lower-outer quadrant of left breast of female, estrogen receptor negative ([place]) 06/11/2017   left breast  . Mixed hyperlipidemia 06/17/2018  . partially blind   . Personal history of chemotherapy 2018-2019  . Personal history of radiation therapy 2019  . Retinitis pigmentosa   . Solitary pulmonary nodule on lung CT 03/16/2016   per records from [place]  . Uveitis    Past Surgical History:  Past Surgical History:  Procedure Laterality Date  . brain cyst removed  2007   to help with headaches per patient , aspirated   . BREAST BIOPSY Left 06/06/2017   x2  . BREAST CYST ASPIRATION Left 06/06/2017  . BREAST LUMPECTOMY Left 06/26/2017  . BREAST LUMPECTOMY WITH RADIOACTIVE SEED AND SENTINEL LYMPH NODE BIOPSY Left 06/26/2017   Procedure: BREAST LUMPECTOMY WITH RADIOACTIVE SEED AND SENTINEL LYMPH NODE BIOPSY ERAS  PATHWAY;  Surgeon: [name], MD;  Location: [place];  Service: General;  Laterality: Left;  pec block  . NASAL TURBINATE REDUCTION    . PORTACATH PLACEMENT N/A 06/26/2017   Procedure: INSERTION PORT-A-CATH;  Surgeon: [name], MD;  Location: [place];  Service: General;  Laterality: N/A;  . TRANSTHORACIC ECHOCARDIOGRAM  12/2015   A) [facility] May 2017: Mild concentric  LVH. Global hypokinesis. GR daily. EF 18% severe LA dilation. Mitral annular dilatation with papillary muscle dysfunction and moderate MR. Dilated IVC consistent with elevated RAP.  [facility] Kitchen TRANSTHORACIC ECHOCARDIOGRAM  05/2017; 08/2017   a) Mild concentric LVH.  EF 45 to 50% with diffuse hypokinesis.  GR 1 DD.  Mild aortic root dilation.;; b)  Mild LVH EF 45 to 50%.  Diffuse HK.  No significant valve disease.  No significant change.      Social History:  reports that she quit smoking about 3 years ago. Her smoking  use included cigarettes. She has a 5.75 pack-year smoking history. She has never used smokeless tobacco. She reports that she does not drink alcohol or use drugs. Family History:  Family History  Problem Relation Age of Onset  . Colon cancer Mother 67       deceased 75  . Heart attack Father   . Breast cancer Other 58       2nd cousin on maternal side; currently 90  . Pancreatic cancer Other        2nd cousin once removed on maternal side; deceased 57s  . Fibromyalgia Sister   . Esophageal cancer Neg Hx   . Rectal cancer Neg Hx   . Stomach cancer Neg Hx      HOME MEDICATIONS: Allergies as of 01/27/2019      Reactions   Latex Hives, Itching   Burning    Tylenol With Codeine #3 [acetaminophen-codeine] Nausea And Vomiting   Penicillins Nausea Only, Swelling, Rash   Has patient had a PCN reaction causing immediate rash, facial/tongue/throat swelling, SOB or lightheadedness with hypotension: Yes Has patient had a PCN reaction causing severe rash involving mucus membranes or skin necrosis: Yes Has patient had a PCN reaction that required hospitalization: No Has patient had a PCN reaction occurring within the last 10 years: No TONGUE SWELLING AND RASH AROUND MOUTH If all of the above answers are "NO", then may proceed with Cephalosporin use.      Medication List       Accurate as of January 27, 2019 11:06 AM. If you have any questions, ask your nurse or doctor.        STOP taking these medications   Basaglar KwikPen 100 UNIT/ML Sopn Stopped by: Dorita Sciara, MD   linagliptin 5 MG Tabs tablet Commonly known as: Tradjenta Stopped by: Dorita Sciara, MD     TAKE these medications   Accu-Chek FastClix Lancets Misc Test twice a day. Pt uses an accu-chek guide me meter   albuterol 108 (90 Base) MCG/ACT inhaler Commonly known as: VENTOLIN HFA Inhale 2 puffs into the lungs every 6 (six) hours as needed for wheezing or shortness of breath.   aspirin 81 MG chewable  tablet Chew 81 mg by mouth daily.   atorvastatin 20 MG tablet Commonly known as: LIPITOR Take 1 tablet (20 mg total) by mouth daily.   carvedilol 12.5 MG tablet Commonly known as: COREG Take 1 tablet (12.5 mg total) by mouth 2 (two) times daily with a meal.   furosemide 40 MG tablet Commonly known as: LASIX Take 1 tablet (40 mg total) by mouth daily. May take an additional 40 mg tablet if weight gain of 3lbs or more overnight.   gabapentin 300 MG capsule Commonly known as: NEURONTIN Take 1 capsule (300 mg total) by mouth 3 (three) times daily.   glucose blood test strip 2x daily What changed: additional instructions Changed by: Dorita Sciara, MD  loratadine 10 MG tablet Commonly known as: CLARITIN Take 1 tablet (10 mg total) by mouth daily.   metFORMIN 1000 MG tablet Commonly known as: GLUCOPHAGE Take 1 tablet (1,000 mg total) by mouth 2 (two) times daily with a meal.   NovoLOG Mix 70/30 FlexPen (70-30) 100 UNIT/ML FlexPen Generic drug: insulin aspart protamine - aspart Inject 0.24 mLs (24 Units total) into the skin 2 (two) times daily. Started by: Dorita Sciara, MD   potassium chloride SA 20 MEQ tablet Commonly known as: K-DUR Take 20 meq tablet when taking an extra lasix tablet daily as needed   Prodigy Voice Blood Glucose w/Device Kit 1 Device by Does not apply route as directed. Started by: Dorita Sciara, MD   sacubitril-valsartan 49-51 MG Commonly known as: Entresto Take 1 tablet by mouth 2 (two) times daily.   spironolactone 25 MG tablet Commonly known as: ALDACTONE Take 1 tablet (25 mg total) by mouth daily.   VALTREX PO Take by mouth.   vitamin C 500 MG tablet Commonly known as: ASCORBIC ACID Take 1,000 mg 2 (two) times daily by mouth.        ALLERGIES: Allergies  Allergen Reactions  . Latex Hives and Itching    Burning   . Tylenol With Codeine #3 [Acetaminophen-Codeine] Nausea And Vomiting  . Penicillins Nausea Only,  Swelling and Rash    Has patient had a PCN reaction causing immediate rash, facial/tongue/throat swelling, SOB or lightheadedness with hypotension: Yes Has patient had a PCN reaction causing severe rash involving mucus membranes or skin necrosis: Yes Has patient had a PCN reaction that required hospitalization: No Has patient had a PCN reaction occurring within the last 10 years: No TONGUE SWELLING AND RASH AROUND MOUTH If all of the above answers are "NO", then may proceed with Cephalosporin use.      REVIEW OF SYSTEMS: A comprehensive ROS was conducted with the patient and is negative except as per HPI and below:  Review of Systems  Constitutional: Negative for chills and fever.  HENT: Negative for congestion and sore throat.   Eyes:       Legally blind  Respiratory: Negative for cough and shortness of breath.   Cardiovascular: Negative for chest pain and palpitations.  Gastrointestinal: Negative for diarrhea and nausea.  Genitourinary: Positive for frequency.  Musculoskeletal: Positive for back pain.  Skin: Positive for itching. Negative for rash.  Neurological: Negative for tingling and tremors.  Endo/Heme/Allergies: Positive for polydipsia.  Psychiatric/Behavioral: Negative for depression. The patient is nervous/anxious.       OBJECTIVE:   VITAL SIGNS: BP 102/70 (BP Location: Right Arm, Patient Position: Sitting, Cuff Size: Large)   Pulse 82   Temp 98.1 F (36.7 C) (Oral)   Ht 5' 7.25" (1.708 m)   Wt 230 lb 6.4 oz (104.5 kg)   SpO2 98%   BMI 35.82 kg/m    PHYSICAL EXAM:  General: Pt appears well and is in NAD  Hydration: Well-hydrated with moist mucous membranes and good skin turgor  HEENT: Head: Unremarkable . Oropharynx clear without exudate.  Eyes:Legally blind    Neck: General: Supple without adenopathy or carotid bruits. Thyroid: Thyroid size normal.  No goiter or nodules appreciated. No thyroid bruit.  Lungs: Clear with good BS bilat with no rales, rhonchi,  or wheezes  Heart: RRR with normal S1 and S2 and no gallops; no murmurs; no rub  Abdomen: Normoactive bowel sounds, soft, nontender, without masses or organomegaly palpable  Extremities:  Lower extremities -  No pretibial edema. No lesions.  Skin: Normal texture and temperature to palpation. No rash noted.  Neuro: MS is good with appropriate affect, pt is alert and Ox3    DM foot exam: 01/27/2019  The skin of the feet is intact without sores or ulcerations. The pedal pulses are 2+ on right and 2+ on left. The sensation is intact to a screening 5.07, 10 gram monofilament bilaterally   DATA REVIEWED:  Lab Results  Component Value Date   HGBA1C 13.0 (A) 12/17/2018   Lab Results  Component Value Date   LDLCALC 103 (H) 12/17/2018   CREATININE 0.85 12/17/2018   No results found for: [facility]  Lab Results  Component Value Date   CHOL 176 12/17/2018   HDL 29 (L) 12/17/2018   LDLCALC 103 (H) 12/17/2018   TRIG 220 (H) 12/17/2018   CHOLHDL 6.1 (H) 12/17/2018        ASSESSMENT / PLAN / RECOMMENDATIONS:   1) Type 2 Diabetes Mellitus, Newly Diagnosed, Without complications - Most recent A1c of 13.0 %. Goal A1c < 7.0 %.    Plan: GENERAL: I have discussed with the patient the pathophysiology of diabetes. We went over the natural progression of the disease. We talked about both insulin resistance and insulin deficiency. We stressed the importance of lifestyle changes including diet and exercise. I explained the complications associated with diabetes including retinopathy, nephropathy, neuropathy as well as increased risk of cardiovascular disease. We went over the benefit seen with glycemic control.   I explained to the patient that diabetic patients are at higher than normal risk for amputations. The patient was informed that diabetes is the number one cause of non-traumatic amputations in [place].    Her legally blind status is a major barrier to diabetes self control, she is  dependent on her daughter to check her glucose and administer insulin.   We are going to prescribe her Prodigy Talk , a glucose meter for the blind  Despite taking metformin and basal insulin for a month, her BG's continues to be in the 300's or higher, she is glucose toxic and will need a lot of insulin to break this toxicity . Ideally she needs prandial insulin added to each meal but this will be difficult given her dependence on her daughter who has her own life.  We are going to start her on insulin mix to be taken twice a day with meal and continue metformin while stopping basaglar and tradjenta   Discussed the importance of diet and lifestyle changes and the importance of avoiding sugar-sweetened beverages and avoiding snacks when possible. Discussed low CHO options for snacks.   MEDICATIONS: - STOP Basaglar  - STOP Tradjenta - Continue Metformin 1000 mg Twice a day with meals - Take Novolog Mix 24 units with Breakfast and Supper    EDUCATION / INSTRUCTIONS:  BG monitoring instructions: Patient is instructed to check her blood sugars 2 times a day, Before breakfast and supper .  Call [facility] if: BG persistently < 70 or > 300. . I reviewed the Rule of 15 for the treatment of hypoglycemia in detail with the patient. Literature supplied.   2) Diabetic complications:   Eye: Does not have known diabetic retinopathy but has retinitis pigmentosa   Neuro/ Feet: Does not have known diabetic peripheral neuropathy.  Renal: Patient does not have known baseline CKD. She is on an ACEI/ARB at present.  3) Lipids: LDL above goal. She was recently started on lipitor 20 mg  daily   4) Hypertension: She is  at goal of < 140/90 mmHg.    F/u 8 weeks    Signed electronically by: Mack Guise, MD  [facility]  [facility] [address] [place], [place] [address] Phone: [phone] FAX: [phone]   CC: Girtha Rm, NP-C [address] [address] Phone: [phone]  Fax: [phone]    Return to Endocrinology clinic as below: Future Appointments  Date Time Provider [place]  02/01/2019 12:45 PM [facility] LAB 6 [facility] None  02/01/2019  1:15 PM [name], MD [facility] None  03/02/2019 11:00 AM Short, Max Sane, RD [facility]  03/10/2019  9:10 AM Shamleffer, Melanie Crazier, MD [facility] None  05/07/2019 10:00 AM [name], MD [facility]

## 2019-01-27 NOTE — Telephone Encounter (Signed)
Kristin Ward,  Please call the patient or daughter  Kathrin Penner, let them know I prescribed a meter called "Prodigy Talk" its specifically for the visually impaired, we may have to get it authorized through her insurance so it may take a week or so before this is all done.  Once she gets the device, they can call our office and schedule an appointment with Ms. Bernie Covey to walk through the usage process.    Thanks   Abby Nena Jordan, MD  [facility]  [facility] [address] [place], [place] [address] Phone: [phone] FAX: [phone]

## 2019-01-30 ENCOUNTER — Other Ambulatory Visit: Payer: Self-pay | Admitting: Adult Health

## 2019-02-01 ENCOUNTER — Inpatient Hospital Stay (HOSPITAL_BASED_OUTPATIENT_CLINIC_OR_DEPARTMENT_OTHER): Payer: Medicare HMO | Admitting: Nurse Practitioner

## 2019-02-01 ENCOUNTER — Encounter: Payer: Self-pay | Admitting: Nurse Practitioner

## 2019-02-01 ENCOUNTER — Other Ambulatory Visit: Payer: Self-pay

## 2019-02-01 ENCOUNTER — Inpatient Hospital Stay: Payer: Medicare HMO | Attending: Hematology

## 2019-02-01 VITALS — BP 136/78 | HR 78 | Temp 97.7°F | Resp 18 | Ht 67.25 in | Wt 234.1 lb

## 2019-02-01 DIAGNOSIS — E119 Type 2 diabetes mellitus without complications: Secondary | ICD-10-CM | POA: Diagnosis not present

## 2019-02-01 DIAGNOSIS — Z853 Personal history of malignant neoplasm of breast: Secondary | ICD-10-CM | POA: Insufficient documentation

## 2019-02-01 DIAGNOSIS — J449 Chronic obstructive pulmonary disease, unspecified: Secondary | ICD-10-CM | POA: Diagnosis not present

## 2019-02-01 DIAGNOSIS — I5042 Chronic combined systolic (congestive) and diastolic (congestive) heart failure: Secondary | ICD-10-CM

## 2019-02-01 DIAGNOSIS — Z794 Long term (current) use of insulin: Secondary | ICD-10-CM | POA: Diagnosis not present

## 2019-02-01 DIAGNOSIS — I110 Hypertensive heart disease with heart failure: Secondary | ICD-10-CM

## 2019-02-01 DIAGNOSIS — C50512 Malignant neoplasm of lower-outer quadrant of left female breast: Secondary | ICD-10-CM | POA: Diagnosis not present

## 2019-02-01 DIAGNOSIS — D649 Anemia, unspecified: Secondary | ICD-10-CM

## 2019-02-01 DIAGNOSIS — N950 Postmenopausal bleeding: Secondary | ICD-10-CM | POA: Insufficient documentation

## 2019-02-01 DIAGNOSIS — H3552 Pigmentary retinal dystrophy: Secondary | ICD-10-CM | POA: Insufficient documentation

## 2019-02-01 DIAGNOSIS — E2839 Other primary ovarian failure: Secondary | ICD-10-CM

## 2019-02-01 LAB — COMPREHENSIVE METABOLIC PANEL
ALT: 16 U/L (ref 0–44)
AST: 18 U/L (ref 15–41)
Albumin: 3.7 g/dL (ref 3.5–5.0)
Alkaline Phosphatase: 94 U/L (ref 38–126)
Anion gap: 8 (ref 5–15)
BUN: 10 mg/dL (ref 6–20)
CO2: 27 mmol/L (ref 22–32)
Calcium: 9.4 mg/dL (ref 8.9–10.3)
Chloride: 107 mmol/L (ref 98–111)
Creatinine, Ser: 0.97 mg/dL (ref 0.44–1.00)
GFR calc Af Amer: >60 mL/min (ref >60)
GFR calc non Af Amer: >60 mL/min (ref >60)
Glucose, Bld: 312 mg/dL — ABNORMAL HIGH (ref 70–99)
Potassium: 4.6 mmol/L (ref 3.5–5.1)
Sodium: 142 mmol/L (ref 135–145)
Total Bilirubin: 0.6 mg/dL (ref 0.3–1.2)
Total Protein: 7 g/dL (ref 6.5–8.1)

## 2019-02-01 LAB — CBC WITH DIFFERENTIAL/PLATELET
Abs Immature Granulocytes: 0.03 10*3/uL (ref 0.00–0.07)
Basophils Absolute: 0 10*3/uL (ref 0.0–0.1)
Basophils Relative: 0 %
Eosinophils Absolute: 0.2 10*3/uL (ref 0.0–0.5)
Eosinophils Relative: 3 %
HCT: 35.9 % — ABNORMAL LOW (ref 36.0–46.0)
Hemoglobin: 11.7 g/dL — ABNORMAL LOW (ref 12.0–15.0)
Immature Granulocytes: 1 %
Lymphocytes Relative: 30 %
Lymphs Abs: 1.7 10*3/uL (ref 0.7–4.0)
MCH: 30.1 pg (ref 26.0–34.0)
MCHC: 32.6 g/dL (ref 30.0–36.0)
MCV: 92.3 fL (ref 80.0–100.0)
Monocytes Absolute: 0.5 10*3/uL (ref 0.1–1.0)
Monocytes Relative: 8 %
Neutro Abs: 3.3 10*3/uL (ref 1.7–7.7)
Neutrophils Relative %: 58 %
Platelets: 301 10*3/uL (ref 150–400)
RBC: 3.89 MIL/uL (ref 3.87–5.11)
RDW: 13 % (ref 11.5–15.5)
WBC: 5.7 10*3/uL (ref 4.0–10.5)
nRBC: 0 % (ref 0.0–0.2)

## 2019-02-01 NOTE — Telephone Encounter (Signed)
Called and made daughter aware of MD note

## 2019-02-01 NOTE — Progress Notes (Signed)
Kristin Ward   [phone] [phone]   Clinic Follow up Note   Patient Care Team: Girtha Rm, NP-C as PCP - General (Family Medicine) [name], MD as PCP - Cardiology (Cardiology) [name], MD as Consulting Physician (Hematology) [name], MD as Consulting Physician (General Surgery) 02/01/2019  CHIEF COMPLAINT: f/u breast cancer   SUMMARY OF ONCOLOGIC HISTORY: Oncology History Overview Note  Cancer Staging Malignant neoplasm of lower-outer quadrant of left breast of female, estrogen receptor negative ([place]) Staging form: Breast, AJCC 8th Edition - Clinical stage from 06/06/2017: Stage IB (cT1c, cN0, cM0, G3, ER: Negative, PR: Negative, HER2: Negative) - Signed by [name], MD on 06/15/2017 - Pathologic stage from 06/26/2017: Stage IB (pT1c, pN0, cM0, G3, ER: Negative, PR: Negative, HER2: Negative) - Signed by [name], MD on 07/10/2017     Malignant neoplasm of lower-outer quadrant of left breast of female, estrogen receptor negative ([place])  05/30/2017 Mammogram   Diagnostic mammo and US IMPRESSION: 1. Highly suspicious 1.4 cm mass in the slightly lower slightly outer left breast -tissue sampling recommended. 2. Indeterminate 0.5 mm mass in the slightly lower slightly outer left breast -tissue sampling recommended. 3. At least 2 left axillary lymph nodes with borderline cortical thickness.   06/06/2017 Initial Biopsy   Diagnosis 1. Breast, left, needle core biopsy, 5:30 o'clock - INVASIVE DUCTAL CARCINOMA, G3 2. Lymph node, needle/core biopsy, left axillary - NO CARCINOMA IDENTIFIED IN ONE LYMPH NODE (0/1)   06/06/2017 Initial Diagnosis   Malignant neoplasm of lower-outer quadrant of left breast of female, estrogen receptor negative ([place])   06/06/2017 Receptors her2   Estrogen Receptor: 0%, NEGATIVE Progesterone Receptor: 0%, NEGATIVE Proliferation Marker Ki67: 70%   06/26/2017 Surgery   LEFT BREAST LUMPECTOMY  WITH RADIOACTIVE SEED AND SENTINEL LYMPH NODE BIOPSY ERAS  PATHWAY AND INSERTION PORT-A-CATH By Dr. Barry Dienes on 06/26/17    06/26/2017 Pathology Results   Diagnosis 06/26/17  1. Breast, lumpectomy, Left - INVASIVE DUCTAL CARCINOMA, GRADE III/III, SPANNING 1.2 CM. - THE SURGICAL RESECTION MARGINS ARE NEGATIVE FOR CARCINOMA. - SEE ONCOLOGY TABLE BELOW. 2. Lymph node, sentinel, biopsy, Left axillary #1 - THERE IS NO EVIDENCE OF CARCINOMA IN 1 OF 1 LYMPH NODE (0/1). 3. Lymph node, sentinel, biopsy, Left axillary #2 - THERE IS NO EVIDENCE OF CARCINOMA IN 1 OF 1 LYMPH NODE (0/1). 4. Lymph node, sentinel, biopsy, Left axillary #3 - THERE IS NO EVIDENCE OF CARCINOMA IN 1 OF 1 LYMPH NODE (0/1).    07/25/2017 - 09/29/2017 Chemotherapy   Adjuvant cytoxan and docetaxel (TC) every 3 weekd for 4 cycles     11/19/2017 - 12/17/2017 Radiation Therapy   Adjuvant breast radiation Left breast treated to 42.5 Gy with 17 fx of 2.5 Gy followed by a boost of 7.5 Gy with 3 fx of 2.5 Gy       CURRENT THERAPY: Surveillance  S/p left lumpectomy, adjuvant TC and radiation   INTERVAL HISTORY: Kristin Ward returns for f/u as scheduled. She was last seen 07/2018. She was diagnosed with DM in the interval. She is on insulin. Managed by PCP and endocrinology. BG ranges 191 - 500+ at home. Her daughter is here with her due to her visual impairment and administers insulin at home. Patient denies decreased appetite or weight loss. Denies increased fatigue from baseline. Denies n/v/c/d. She had 1 episode of vaginal bleeding recently. She called Dr. Hulan Fray, no subsequent tests were ordered. LMP 2015. Denies epistaxis or GI bleeding. Tingling in  her fingers resolved. She ran out of gabapentin but wants a refill in case she needs it again. Left breast is irritating and sore when something rubs against it. There is a known seroma. She was planning PAC removal and seroma aspiration per Dr. Barry Dienes but then diagnosed with DM, surgery is  on hold.    MEDICAL HISTORY:  Past Medical History:  Diagnosis Date  . Abnormal x-ray of lungs with single pulmonary nodule 03/15/2016   per records from [place]   . Abnormal x-ray of lungs with single pulmonary nodule 03/15/2016   per records from [place]   . Anemia   . Asthma   . CHF (congestive heart failure) ([place])   . Cholelithiasis 05/19/2017   On CT  . Coronary artery calcification seen on CAT scan 05/19/2017  . Dilated idiopathic cardiomyopathy ([place]) 12/2015   EF 15-20%. Diagnosed in [facility]  . Headache    NONE RECENT  . Hypertension   . Malignant neoplasm of lower-outer quadrant of left breast of female, estrogen receptor negative ([place]) 06/11/2017   left breast  . Mixed hyperlipidemia 06/17/2018  . partially blind   . Personal history of chemotherapy 2018-2019  . Personal history of radiation therapy 2019  . Retinitis pigmentosa   . Solitary pulmonary nodule on lung CT 03/16/2016   per records from [place]  . Uveitis     SURGICAL HISTORY: Past Surgical History:  Procedure Laterality Date  . brain cyst removed  2007   to help with headaches per patient , aspirated   . BREAST BIOPSY Left 06/06/2017   x2  . BREAST CYST ASPIRATION Left 06/06/2017  . BREAST LUMPECTOMY Left 06/26/2017  . BREAST LUMPECTOMY WITH RADIOACTIVE SEED AND SENTINEL LYMPH NODE BIOPSY Left 06/26/2017   Procedure: BREAST LUMPECTOMY WITH RADIOACTIVE SEED AND SENTINEL LYMPH NODE BIOPSY ERAS  PATHWAY;  Surgeon: [name], MD;  Location: [place];  Service: General;  Laterality: Left;  pec block  . NASAL TURBINATE REDUCTION    . PORTACATH PLACEMENT N/A 06/26/2017   Procedure: INSERTION PORT-A-CATH;  Surgeon: [name], MD;  Location: [place];  Service: General;  Laterality: N/A;  . TRANSTHORACIC ECHOCARDIOGRAM  12/2015   A) [facility] May 2017: Mild concentric LVH. Global hypokinesis. GR daily. EF 18% severe LA dilation. Mitral annular dilatation with papillary  muscle dysfunction and moderate MR. Dilated IVC consistent with elevated RAP.  Marland Kitchen TRANSTHORACIC ECHOCARDIOGRAM  05/2017; 08/2017   a) Mild concentric LVH.  EF 45 to 50% with diffuse hypokinesis.  GR 1 DD.  Mild aortic root dilation.;; b)  Mild LVH EF 45 to 50%.  Diffuse HK.  No significant valve disease.  No significant change.    I have reviewed the social history and family history with the patient and they are unchanged from previous note.  ALLERGIES:  is allergic to latex; tylenol with codeine #3 [acetaminophen-codeine]; and penicillins.  MEDICATIONS:  Current Outpatient Medications  Medication Sig Dispense Refill  . Accu-Chek FastClix Lancets MISC Test twice a day. Pt uses an accu-chek guide me meter 102 each 2  . albuterol (PROVENTIL HFA;VENTOLIN HFA) 108 (90 Base) MCG/ACT inhaler Inhale 2 puffs into the lungs every 6 (six) hours as needed for wheezing or shortness of breath. 1 Inhaler 0  . aspirin 81 MG chewable tablet Chew 81 mg by mouth daily.    Marland Kitchen atorvastatin (LIPITOR) 20 MG tablet Take 1 tablet (20 mg total) by mouth daily. 90 tablet 1  . Blood Glucose Monitoring Suppl (PRODIGY VOICE  BLOOD GLUCOSE) w/Device KIT 1 Device by Does not apply route as directed. 1 kit 0  . carvedilol (COREG) 12.5 MG tablet Take 1 tablet (12.5 mg total) by mouth 2 (two) times daily with a meal. 180 tablet 1  . furosemide (LASIX) 40 MG tablet TAKE 1 TAB BY MOUTH DAILY. MAY TAKE ADDITIONAL 40 MG TABLET IF WEIGHT GAIN OF 3LBS OR MORE OVERNIGHT 40 tablet 3  . glucose blood test strip 2x daily 100 each 12  . insulin aspart protamine - aspart (NOVOLOG MIX 70/30 FLEXPEN) (70-30) 100 UNIT/ML FlexPen Inject 0.24 mLs (24 Units total) into the skin 2 (two) times daily. 15 mL 11  . loratadine (CLARITIN) 10 MG tablet Take 1 tablet (10 mg total) by mouth daily. 30 tablet 5  . metFORMIN (GLUCOPHAGE) 1000 MG tablet Take 1 tablet (1,000 mg total) by mouth 2 (two) times daily with a meal. 180 tablet 3  . potassium chloride  SA (K-DUR,KLOR-CON) 20 MEQ tablet Take 20 meq tablet when taking an extra lasix tablet daily as needed 90 tablet 3  . sacubitril-valsartan (ENTRESTO) 49-51 MG Take 1 tablet by mouth 2 (two) times daily. 60 tablet 6  . spironolactone (ALDACTONE) 25 MG tablet Take 1 tablet (25 mg total) by mouth daily. 90 tablet 1  . valACYclovir HCl (VALTREX PO) Take by mouth.    . vitamin C (ASCORBIC ACID) 500 MG tablet Take 1,000 mg 2 (two) times daily by mouth.    . gabapentin (NEURONTIN) 300 MG capsule Take 1 capsule (300 mg total) by mouth 3 (three) times daily. (Patient not taking: Reported on 02/01/2019) 90 capsule 1   No current facility-administered medications for this visit.     PHYSICAL EXAMINATION: ECOG PERFORMANCE STATUS: 0 - Asymptomatic  Vitals:   02/01/19 1240  BP: 136/78  Pulse: 78  Resp: 18  Temp: 97.7 F (36.5 C)  SpO2: 97%   Filed Weights   02/01/19 1240  Weight: 234 lb 1.6 oz (106.2 kg)    GENERAL:alert, no distress and comfortable SKIN: no obvious rash  LYMPH:  no palpable cervical, supraclavicular, or axillary lymphadenopathy  LUNGS: respirations even and unlabored  HEART:  no lower extremity edema Musculoskeletal:no cyanosis of digits  NEURO: alert & oriented x 3 with fluent speech. Visually impaired. Normal gait  PAC without erythema  Breast exam : s/p left lumpectomy, incisions x2 are well healed. approx 5 cm area of firmness in lower left breast consistent with location of known seroma. No other palpable mass in either breast or axilla that I could appreciate   LABORATORY DATA:  I have reviewed the data as listed CBC Latest Ref Rng & Units 02/01/2019 12/17/2018 07/31/2018  WBC 4.0 - 10.5 K/uL 5.7 4.6 6.1  Hemoglobin 12.0 - 15.0 g/dL 11.7(L) 12.9 12.5  Hematocrit 36.0 - 46.0 % 35.9(L) 37.7 38.2  Platelets 150 - 400 K/uL 301 301 270     CMP Latest Ref Rng & Units 02/01/2019 12/17/2018 09/30/2018  Glucose 70 - 99 mg/dL 312(H) 468(H) 142(H)  BUN 6 - 20 mg/dL 10 7(L) 11   Creatinine 0.44 - 1.00 mg/dL 0.97 0.85 0.84  Sodium 135 - 145 mmol/L 142 138 140  Potassium 3.5 - 5.1 mmol/L 4.6 4.6 4.0  Chloride 98 - 111 mmol/L 107 101 109  CO2 22 - 32 mmol/L 27 19(L) 24  Calcium 8.9 - 10.3 mg/dL 9.4 9.7 9.0  Total Protein 6.5 - 8.1 g/dL 7.0 7.2 -  Total Bilirubin 0.3 - 1.2 mg/dL 0.6 0.5 -  Alkaline Phos 38 - 126 U/L 94 140(H) -  AST 15 - 41 U/L 18 16 -  ALT 0 - 44 U/L 16 14 -      RADIOGRAPHIC STUDIES: I have personally reviewed the radiological images as listed and agreed with the findings in the report. No results found.   ASSESSMENT & PLAN: Kristin Ward is a 63 y.o. female with a history of    1. 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 BG 312. Physical exam is consistent with seroma. No clinical concern for recurrence -the seroma has increased per patient and is irritating and painful; she was planning to undergo seroma aspiration with PAC removal but was diagnosed with DM and surgery is on hold.  -I will see if Dr. Barry Dienes can aspirate the seroma without PAC surgery in the meantime.  -otherwise Kristin Ward is doing well. Continue breast cancer surveillance -will repeat CBC with additional labs to r/o nutritional anemia in 2 months with PAC flush -Mammogram and DEXA due in 05/2019, I ordered both today -lab, f/u in 6 months  2. Genetics -Results were negative for any gene mutations.   3. Chronic combined systolic and diastolic heart failure, EF 45-50% -Follow-up with her cardiologist Dr. Ellyn Hack  -stable  4. HTN, COPD, retinitis pigmentosa with limited vision -Continue to follow-up with primary care physician and cardiologist  -She is legally blind, daughter is  here today and administers medication  5. H/o constipation and bleeding with straining, followed by Dr. Silverio Decamp -denies recent GI bleeding   6. DM - insulin dependent  -recently diagnosed, BG range 190- >500  -on metformin, insulin   7. Anemia -She developed anemia during adjuvant chemotherapy, which resolved after she completed chemo -She has mild anemia today, Hgb 11.7 -Recent B12 in 12/2018 was normal -possibly related to chronic diseases CHF, HTN, lung disease, and now uncontrolled DM -I recommend to repeat CBC with iron, ferritin, folate, and MMA in 2 months to r/o nutritional cause for her anemia   8. Vaginal bleeding, postmenopausal -occurred once, patient called Dr. Hulan Fray Ob/GYN -no further tests were ordered  -LMP 2015 -I strongly encouraged her to call us or Dr. Hulan Fray if this occurs again, as she may require further testing for this abnormal finding. She understands   PLAN: -Labs reviewed -Continue breast cancer surveillance  -Message to Dr. Barry Dienes re: seroma aspiration  -Lab, (CBC, ferritin, iron, MMA, folate) in 2 months with PAC flush -lab, f/u in 6 months  -Mammogram and DEXA due in 05/2019, ordered today    Orders Placed This Encounter  Procedures  . MM DIAG BREAST TOMO BILATERAL    Standing Status:   Future    Standing Expiration Date:   02/01/2020    Order Specific Question:   Reason for Exam (SYMPTOM  OR DIAGNOSIS REQUIRED)    Answer:   h/o triple negative left breast cancer s/p lumpectomy, chemo, radiation    Order Specific Question:   Is the patient pregnant?    Answer:   No    Order Specific Question:   Preferred imaging location?    Answer:   [facility]  . DG Bone Density    Standing Status:   Future    Standing Expiration Date:   02/01/2020  Order Specific Question:   Reason for Exam (SYMPTOM  OR DIAGNOSIS REQUIRED)    Answer:   postmenopausal    Order Specific Question:   Is the patient pregnant?    Answer:   No    Order Specific  Question:   Preferred imaging location?    Answer:   [facility]  . Ferritin    Standing Status:   Future    Standing Expiration Date:   02/01/2020  . Iron and TIBC    Standing Status:   Future    Standing Expiration Date:   02/01/2020  . Folate RBC    Standing Status:   Future    Standing Expiration Date:   02/01/2020  . Methylmalonic acid, serum    Standing Status:   Future    Standing Expiration Date:   02/01/2020   All questions were answered. The patient knows to call the clinic with any problems, questions or concerns. No barriers to learning was detected.     Alla Feeling, NP 02/01/19

## 2019-02-02 ENCOUNTER — Telehealth: Payer: Self-pay | Admitting: Hematology

## 2019-02-02 NOTE — Telephone Encounter (Signed)
Scheduled appt per 6/22 los. Spoke with patient and she is aware of appt date and time.

## 2019-02-03 ENCOUNTER — Telehealth: Payer: Self-pay

## 2019-02-03 NOTE — Telephone Encounter (Signed)
Received fax from [facility] that meter is covered under Medicare part B until 08/12/2019 and not under part D

## 2019-02-16 ENCOUNTER — Encounter (HOSPITAL_BASED_OUTPATIENT_CLINIC_OR_DEPARTMENT_OTHER): Payer: Self-pay | Admitting: *Deleted

## 2019-02-17 ENCOUNTER — Encounter (HOSPITAL_BASED_OUTPATIENT_CLINIC_OR_DEPARTMENT_OTHER): Payer: Self-pay | Admitting: *Deleted

## 2019-02-17 ENCOUNTER — Other Ambulatory Visit: Payer: Self-pay

## 2019-02-17 NOTE — Progress Notes (Signed)
Patient's chart, labs and office visits reviewed with Dr Marcell Barlow. Pt has new onset DM and is working on getting her sugars under control. She has been evaluated by an endocrinologist who is helping her. She states her sugars still run in 300's in evening. Dr Marcell Barlow states OK for [facility], will check CBG on arrival.

## 2019-02-18 DIAGNOSIS — H2513 Age-related nuclear cataract, bilateral: Secondary | ICD-10-CM | POA: Diagnosis not present

## 2019-02-18 DIAGNOSIS — E119 Type 2 diabetes mellitus without complications: Secondary | ICD-10-CM | POA: Diagnosis not present

## 2019-02-18 DIAGNOSIS — H3552 Pigmentary retinal dystrophy: Secondary | ICD-10-CM | POA: Diagnosis not present

## 2019-02-19 ENCOUNTER — Other Ambulatory Visit (HOSPITAL_COMMUNITY)
Admission: RE | Admit: 2019-02-19 | Discharge: 2019-02-19 | Disposition: A | Discharge status: Home / Self Care | Payer: Medicare HMO | Source: Ambulatory Visit | Attending: General Surgery | Admitting: General Surgery

## 2019-02-19 ENCOUNTER — Other Ambulatory Visit: Payer: Self-pay | Admitting: General Surgery

## 2019-02-19 DIAGNOSIS — Z1159 Encounter for screening for other viral diseases: Secondary | ICD-10-CM | POA: Diagnosis not present

## 2019-02-19 DIAGNOSIS — Z01812 Encounter for preprocedural laboratory examination: Secondary | ICD-10-CM | POA: Insufficient documentation

## 2019-02-19 LAB — SARS CORONAVIRUS 2 (TAT 6-24 HRS): SARS Coronavirus 2: NEGATIVE

## 2019-02-19 MED ORDER — CIPROFLOXACIN IN D5W 400 MG/200ML IV SOLN
400.0000 mg | Freq: Once | INTRAVENOUS | Status: AC
  Administered 2019-02-23: 400 mg via INTRAVENOUS

## 2019-02-22 ENCOUNTER — Other Ambulatory Visit: Payer: Self-pay | Admitting: General Surgery

## 2019-02-23 ENCOUNTER — Ambulatory Visit (HOSPITAL_BASED_OUTPATIENT_CLINIC_OR_DEPARTMENT_OTHER)
Admission: RE | Admit: 2019-02-23 | Discharge: 2019-02-23 | Disposition: A | Discharge status: Home / Self Care | Payer: Medicare HMO | Attending: General Surgery | Admitting: General Surgery

## 2019-02-23 ENCOUNTER — Ambulatory Visit (HOSPITAL_BASED_OUTPATIENT_CLINIC_OR_DEPARTMENT_OTHER): Payer: Medicare HMO | Admitting: Anesthesiology

## 2019-02-23 ENCOUNTER — Other Ambulatory Visit: Payer: Self-pay

## 2019-02-23 ENCOUNTER — Encounter (HOSPITAL_BASED_OUTPATIENT_CLINIC_OR_DEPARTMENT_OTHER): Payer: Self-pay | Admitting: *Deleted

## 2019-02-23 ENCOUNTER — Encounter (HOSPITAL_BASED_OUTPATIENT_CLINIC_OR_DEPARTMENT_OTHER)
Admission: RE | Disposition: A | Discharge status: Home / Self Care | Payer: Self-pay | Source: Home / Self Care | Attending: General Surgery

## 2019-02-23 DIAGNOSIS — I110 Hypertensive heart disease with heart failure: Secondary | ICD-10-CM | POA: Diagnosis not present

## 2019-02-23 DIAGNOSIS — Z885 Allergy status to narcotic agent status: Secondary | ICD-10-CM | POA: Diagnosis not present

## 2019-02-23 DIAGNOSIS — Z79899 Other long term (current) drug therapy: Secondary | ICD-10-CM | POA: Diagnosis not present

## 2019-02-23 DIAGNOSIS — I509 Heart failure, unspecified: Secondary | ICD-10-CM | POA: Diagnosis not present

## 2019-02-23 DIAGNOSIS — J449 Chronic obstructive pulmonary disease, unspecified: Secondary | ICD-10-CM | POA: Insufficient documentation

## 2019-02-23 DIAGNOSIS — Z87891 Personal history of nicotine dependence: Secondary | ICD-10-CM | POA: Insufficient documentation

## 2019-02-23 DIAGNOSIS — Z7982 Long term (current) use of aspirin: Secondary | ICD-10-CM | POA: Diagnosis not present

## 2019-02-23 DIAGNOSIS — Z7984 Long term (current) use of oral hypoglycemic drugs: Secondary | ICD-10-CM | POA: Diagnosis not present

## 2019-02-23 DIAGNOSIS — Z452 Encounter for adjustment and management of vascular access device: Secondary | ICD-10-CM | POA: Insufficient documentation

## 2019-02-23 DIAGNOSIS — I2510 Atherosclerotic heart disease of native coronary artery without angina pectoris: Secondary | ICD-10-CM | POA: Diagnosis not present

## 2019-02-23 DIAGNOSIS — L7634 Postprocedural seroma of skin and subcutaneous tissue following other procedure: Secondary | ICD-10-CM | POA: Diagnosis not present

## 2019-02-23 DIAGNOSIS — Z853 Personal history of malignant neoplasm of breast: Secondary | ICD-10-CM | POA: Insufficient documentation

## 2019-02-23 DIAGNOSIS — Z91040 Latex allergy status: Secondary | ICD-10-CM | POA: Diagnosis not present

## 2019-02-23 DIAGNOSIS — E119 Type 2 diabetes mellitus without complications: Secondary | ICD-10-CM | POA: Insufficient documentation

## 2019-02-23 DIAGNOSIS — N6489 Other specified disorders of breast: Secondary | ICD-10-CM | POA: Insufficient documentation

## 2019-02-23 DIAGNOSIS — D649 Anemia, unspecified: Secondary | ICD-10-CM | POA: Diagnosis not present

## 2019-02-23 DIAGNOSIS — I420 Dilated cardiomyopathy: Secondary | ICD-10-CM | POA: Diagnosis not present

## 2019-02-23 DIAGNOSIS — Z886 Allergy status to analgesic agent status: Secondary | ICD-10-CM | POA: Insufficient documentation

## 2019-02-23 DIAGNOSIS — I5042 Chronic combined systolic (congestive) and diastolic (congestive) heart failure: Secondary | ICD-10-CM | POA: Diagnosis not present

## 2019-02-23 DIAGNOSIS — J45909 Unspecified asthma, uncomplicated: Secondary | ICD-10-CM | POA: Diagnosis not present

## 2019-02-23 HISTORY — PX: PORT-A-CATH REMOVAL: SHX5289

## 2019-02-23 HISTORY — PX: FINE NEEDLE ASPIRATION: SHX6590

## 2019-02-23 HISTORY — DX: Type 2 diabetes mellitus without complications: E11.9

## 2019-02-23 LAB — GLUCOSE, CAPILLARY
Glucose-Capillary: 159 mg/dL — ABNORMAL HIGH (ref 70–99)
Glucose-Capillary: 149 mg/dL — ABNORMAL HIGH (ref 70–99)

## 2019-02-23 SURGERY — REMOVAL PORT-A-CATH
Anesthesia: General | Site: Breast | Laterality: Right

## 2019-02-23 MED ORDER — ACETAMINOPHEN 500 MG PO TABS: 1000.0000 mg | ORAL_TABLET | ORAL | Status: DC

## 2019-02-23 MED ORDER — ONDANSETRON HCL 4 MG/2ML IJ SOLN
INTRAMUSCULAR | Status: DC | PRN
  Administered 2019-02-23: 4 mg via INTRAVENOUS

## 2019-02-23 MED ORDER — MIDAZOLAM HCL 2 MG/2ML IJ SOLN
INTRAMUSCULAR | Status: AC
  Filled 2019-02-23: qty 2

## 2019-02-23 MED ORDER — LIDOCAINE HCL (CARDIAC) PF 100 MG/5ML IV SOSY
PREFILLED_SYRINGE | INTRAVENOUS | Status: DC | PRN
  Administered 2019-02-23: 100 mg via INTRAVENOUS

## 2019-02-23 MED ORDER — SCOPOLAMINE 1 MG/3DAYS TD PT72: 1.0000 | MEDICATED_PATCH | Freq: Once | TRANSDERMAL | Status: DC

## 2019-02-23 MED ORDER — PROPOFOL 500 MG/50ML IV EMUL
INTRAVENOUS | Status: AC
  Filled 2019-02-23: qty 50

## 2019-02-23 MED ORDER — SUCCINYLCHOLINE CHLORIDE 200 MG/10ML IV SOSY
PREFILLED_SYRINGE | INTRAVENOUS | Status: AC
  Filled 2019-02-23: qty 10

## 2019-02-23 MED ORDER — LIDOCAINE-EPINEPHRINE (PF) 1 %-1:200000 IJ SOLN
INTRAMUSCULAR | Status: AC
  Filled 2019-02-23: qty 30

## 2019-02-23 MED ORDER — ACETAMINOPHEN 500 MG PO TABS
ORAL_TABLET | ORAL | Status: AC
  Filled 2019-02-23: qty 2

## 2019-02-23 MED ORDER — LACTATED RINGERS IV SOLN
INTRAVENOUS | Status: DC
  Administered 2019-02-23: 07:00:00 via INTRAVENOUS

## 2019-02-23 MED ORDER — LIDOCAINE 2% (20 MG/ML) 5 ML SYRINGE
INTRAMUSCULAR | Status: AC
  Filled 2019-02-23: qty 5

## 2019-02-23 MED ORDER — CHLORHEXIDINE GLUCONATE CLOTH 2 % EX PADS: 6.0000 | MEDICATED_PAD | Freq: Once | CUTANEOUS | Status: DC

## 2019-02-23 MED ORDER — DIPHENHYDRAMINE HCL 50 MG/ML IJ SOLN
6.2500 mg | Freq: Once | INTRAMUSCULAR | Status: AC
  Administered 2019-02-23: 6.5 mg via INTRAVENOUS

## 2019-02-23 MED ORDER — DIPHENHYDRAMINE HCL 50 MG/ML IJ SOLN
INTRAMUSCULAR | Status: AC
  Filled 2019-02-23: qty 1

## 2019-02-23 MED ORDER — ACETAMINOPHEN 500 MG PO TABS
1000.0000 mg | ORAL_TABLET | Freq: Once | ORAL | Status: AC
  Administered 2019-02-23: 1000 mg via ORAL

## 2019-02-23 MED ORDER — BUPIVACAINE HCL (PF) 0.25 % IJ SOLN
INTRAMUSCULAR | Status: DC | PRN
  Administered 2019-02-23: 5 mL

## 2019-02-23 MED ORDER — EPHEDRINE 5 MG/ML INJ
INTRAVENOUS | Status: AC
  Filled 2019-02-23: qty 10

## 2019-02-23 MED ORDER — CIPROFLOXACIN IN D5W 400 MG/200ML IV SOLN
INTRAVENOUS | Status: AC
  Filled 2019-02-23: qty 200

## 2019-02-23 MED ORDER — LIDOCAINE-EPINEPHRINE (PF) 1 %-1:200000 IJ SOLN
INTRAMUSCULAR | Status: DC | PRN
  Administered 2019-02-23: 5 mL

## 2019-02-23 MED ORDER — FENTANYL CITRATE (PF) 100 MCG/2ML IJ SOLN: 25.0000 ug | INTRAMUSCULAR | Status: DC | PRN

## 2019-02-23 MED ORDER — MIDAZOLAM HCL 2 MG/2ML IJ SOLN
1.0000 mg | INTRAMUSCULAR | Status: DC | PRN
  Administered 2019-02-23: 08:00:00 1 mg via INTRAVENOUS

## 2019-02-23 MED ORDER — CIPROFLOXACIN IN D5W 400 MG/200ML IV SOLN
400.0000 mg | INTRAVENOUS | Status: AC
  Administered 2019-02-23: 400 mg via INTRAVENOUS

## 2019-02-23 MED ORDER — PHENYLEPHRINE 40 MCG/ML (10ML) SYRINGE FOR IV PUSH (FOR BLOOD PRESSURE SUPPORT)
PREFILLED_SYRINGE | INTRAVENOUS | Status: AC
  Filled 2019-02-23: qty 10

## 2019-02-23 MED ORDER — PROPOFOL 10 MG/ML IV BOLUS
INTRAVENOUS | Status: DC | PRN
  Administered 2019-02-23: 200 mg via INTRAVENOUS

## 2019-02-23 MED ORDER — FENTANYL CITRATE (PF) 100 MCG/2ML IJ SOLN
INTRAMUSCULAR | Status: AC
  Filled 2019-02-23: qty 2

## 2019-02-23 MED ORDER — BUPIVACAINE-EPINEPHRINE (PF) 0.5% -1:200000 IJ SOLN
INTRAMUSCULAR | Status: AC
  Filled 2019-02-23: qty 30

## 2019-02-23 MED ORDER — BUPIVACAINE HCL (PF) 0.25 % IJ SOLN
INTRAMUSCULAR | Status: AC
  Filled 2019-02-23: qty 30

## 2019-02-23 MED ORDER — GABAPENTIN 100 MG PO CAPS
200.0000 mg | ORAL_CAPSULE | ORAL | Status: AC
  Administered 2019-02-23: 200 mg via ORAL

## 2019-02-23 MED ORDER — GABAPENTIN 100 MG PO CAPS
ORAL_CAPSULE | ORAL | Status: AC
  Filled 2019-02-23: qty 2

## 2019-02-23 MED ORDER — FENTANYL CITRATE (PF) 100 MCG/2ML IJ SOLN
50.0000 ug | INTRAMUSCULAR | Status: DC | PRN
  Administered 2019-02-23: 50 ug via INTRAVENOUS

## 2019-02-23 MED ORDER — OXYCODONE HCL 5 MG PO TABS: 5.0000 mg | ORAL_TABLET | Freq: Four times a day (QID) | ORAL | 0 refills | Status: DC | PRN

## 2019-02-23 MED ORDER — ONDANSETRON HCL 4 MG/2ML IJ SOLN
INTRAMUSCULAR | Status: AC
  Filled 2019-02-23: qty 2

## 2019-02-23 SURGICAL SUPPLY — 36 items
BLADE HEX COATED 2.75 (ELECTRODE) ×3 IMPLANT
BLADE SURG 15 STRL LF DISP TIS (BLADE) ×2 IMPLANT
BLADE SURG 15 STRL SS (BLADE) ×1
CANISTER SUCT 1200ML W/VALVE (MISCELLANEOUS) IMPLANT
CHLORAPREP W/TINT 26 (MISCELLANEOUS) ×3 IMPLANT
COVER BACK TABLE REUSABLE LG (DRAPES) ×3 IMPLANT
COVER MAYO STAND REUSABLE (DRAPES) ×3 IMPLANT
COVER WAND RF STERILE (DRAPES) IMPLANT
DECANTER SPIKE VIAL GLASS SM (MISCELLANEOUS) IMPLANT
DERMABOND ADVANCED (GAUZE/BANDAGES/DRESSINGS) ×1
DERMABOND ADVANCED .7 DNX12 (GAUZE/BANDAGES/DRESSINGS) ×2 IMPLANT
DRAPE LAPAROTOMY 100X72 PEDS (DRAPES) ×3 IMPLANT
DRAPE UTILITY XL STRL (DRAPES) ×3 IMPLANT
ELECT REM PT RETURN 9FT ADLT (ELECTROSURGICAL) ×3
ELECTRODE REM PT RTRN 9FT ADLT (ELECTROSURGICAL) ×2 IMPLANT
GLOVE BIO SURGEON STRL SZ 6 (GLOVE) ×3 IMPLANT
GLOVE BIOGEL PI IND STRL 6.5 (GLOVE) ×2 IMPLANT
GLOVE BIOGEL PI INDICATOR 6.5 (GLOVE) ×1
GOWN STRL REUS W/ TWL LRG LVL3 (GOWN DISPOSABLE) ×2 IMPLANT
GOWN STRL REUS W/TWL 2XL LVL3 (GOWN DISPOSABLE) ×3 IMPLANT
GOWN STRL REUS W/TWL LRG LVL3 (GOWN DISPOSABLE) ×1
NDL HYPO 25X1 1.5 SAFETY (NEEDLE) ×2 IMPLANT
NDL SAFETY ECLIPSE 18X1.5 (NEEDLE) IMPLANT
NEEDLE HYPO 18GX1.5 SHARP (NEEDLE) ×1
NEEDLE HYPO 25X1 1.5 SAFETY (NEEDLE) ×3 IMPLANT
NS IRRIG 1000ML POUR BTL (IV SOLUTION) IMPLANT
PACK BASIN DAY SURGERY FS (CUSTOM PROCEDURE TRAY) ×3 IMPLANT
PENCIL BUTTON HOLSTER BLD 10FT (ELECTRODE) ×3 IMPLANT
SUT MNCRL AB 4-0 PS2 18 (SUTURE) ×3 IMPLANT
SUT VIC AB 3-0 SH 27 (SUTURE) ×1
SUT VIC AB 3-0 SH 27X BRD (SUTURE) ×2 IMPLANT
SYR 20CC LL (SYRINGE) ×1 IMPLANT
SYR CONTROL 10ML LL (SYRINGE) ×3 IMPLANT
TOWEL GREEN STERILE FF (TOWEL DISPOSABLE) ×3 IMPLANT
TUBE CONNECTING 20X1/4 (TUBING) IMPLANT
YANKAUER SUCT BULB TIP NO VENT (SUCTIONS) IMPLANT

## 2019-02-23 NOTE — Anesthesia Procedure Notes (Signed)
Procedure Name: LMA Insertion Date/Time: 02/23/2019 7:46 AM Performed by: Willa Frater, CRNA Pre-anesthesia Checklist: Patient identified, Emergency Drugs available, Suction available and Patient being monitored Patient Re-evaluated:Patient Re-evaluated prior to induction Oxygen Delivery Method: Circle system utilized Preoxygenation: Pre-oxygenation with 100% oxygen Induction Type: IV induction Ventilation: Mask ventilation without difficulty LMA: LMA inserted LMA Size: 4.0 Number of attempts: 1 Airway Equipment and Method: Bite block Placement Confirmation: positive ETCO2 Tube secured with: Tape Dental Injury: Teeth and Oropharynx as per pre-operative assessment

## 2019-02-23 NOTE — Discharge Instructions (Addendum)
[place] Office Phone Number [phone]   POST OP INSTRUCTIONS  Always review your discharge instruction sheet given to you by the facility where your surgery was performed.  IF YOU HAVE DISABILITY OR FAMILY LEAVE FORMS, YOU MUST BRING THEM TO THE OFFICE FOR PROCESSING.  DO NOT GIVE THEM TO YOUR DOCTOR.  1. A prescription for pain medication may be given to you upon discharge.  Take your pain medication as prescribed, if needed.  If narcotic pain medicine is not needed, then you may take acetaminophen (Tylenol) or ibuprofen (Advil) as needed. No Tylenol until 1:15pm! 2. Take your usually prescribed medications unless otherwise directed 3. If you need a refill on your pain medication, please contact your pharmacy.  They will contact our office to request authorization.  Prescriptions will not be filled after 5pm or on week-ends. 4. You should eat very light the first 24 hours after surgery, such as soup, crackers, pudding, etc.  Resume your normal diet the day after surgery 5. It is common to experience some constipation if taking pain medication after surgery.  Increasing fluid intake and taking a stool softener will usually help or prevent this problem from occurring.  A mild laxative (Milk of Magnesia or Miralax) should be taken according to package directions if there are no bowel movements after 48 hours. 6. You may shower in 48 hours.  The surgical glue will flake off in 2-3 weeks.   7. ACTIVITIES:  No strenuous activity or heavy lifting for 1 week.   a. You may drive when you no longer are taking prescription pain medication, you can comfortably wear a seatbelt, and you can safely maneuver your car and apply brakes. b. RETURN TO WORK:  __________0-9 weeks if applicable.  _______________ Kristin Ward should see your doctor in the office for a follow-up appointment approximately three-four weeks after your surgery.    WHEN TO CALL YOUR DOCTOR: 1. Fever over 101.0 2. Nausea  and/or vomiting. 3. Extreme swelling or bruising. 4. Continued bleeding from incision. 5. Increased pain, redness, or drainage from the incision.  The clinic staff is available to answer your questions during regular business hours.  Please dont hesitate to call and ask to speak to one of the nurses for clinical concerns.  If you have a medical emergency, go to the nearest emergency room or call 911.  A surgeon from [facility] is always on call at the hospital.  For further questions, please visit [email or web address]     Post Anesthesia Home Care Instructions  Activity: Get plenty of rest for the remainder of the day. A responsible individual must stay with you for 24 hours following the procedure.  For the next 24 hours, DO NOT: -Drive a car -Paediatric nurse -Drink alcoholic beverages -Take any medication unless instructed by your physician -Make any legal decisions or sign important papers.  Meals: Start with liquid foods such as gelatin or soup. Progress to regular foods as tolerated. Avoid greasy, spicy, heavy foods. If nausea and/or vomiting occur, drink only clear liquids until the nausea and/or vomiting subsides. Call your physician if vomiting continues.  Special Instructions/Symptoms: Your throat may feel dry or sore from the anesthesia or the breathing tube placed in your throat during surgery. If this causes discomfort, gargle with warm salt water. The discomfort should disappear within 24 hours.  If you had a scopolamine patch placed behind your ear for the management of post- operative nausea and/or vomiting:  1. The medication in the patch  is effective for 72 hours, after which it should be removed.  Wrap patch in a tissue and discard in the trash. Wash hands thoroughly with soap and water. 2. You may remove the patch earlier than 72 hours if you experience unpleasant side effects which may include dry mouth, dizziness or visual disturbances. 3.  Avoid touching the patch. Wash your hands with soap and water after contact with the patch.

## 2019-02-23 NOTE — Transfer of Care (Signed)
Immediate Anesthesia Transfer of Care Note  Patient: Kristin Ward  Procedure(s) Performed: REMOVAL PORT-A-CATH (Right ) FINE NEEDLE ASPIRATION (Left Breast)  Patient Location: PACU  Anesthesia Type:General  Level of Consciousness: awake, alert , oriented and drowsy  Airway & Oxygen Therapy: Patient Spontanous Breathing and Patient connected to face mask oxygen  Post-op Assessment: Report given to RN and Post -op Vital signs reviewed and stable  Post vital signs: Reviewed and stable  Last Vitals:  Vitals Value Taken Time  BP    Temp    Pulse    Resp    SpO2      Last Pain:  Vitals:   02/23/19 0653  TempSrc: Oral  PainSc: 0-No pain         Complications: No apparent anesthesia complications

## 2019-02-23 NOTE — Anesthesia Preprocedure Evaluation (Addendum)
Anesthesia Evaluation  Patient identified by MRN, date of birth, ID band Patient awake    Reviewed: Allergy & Precautions, NPO status , Patient's Chart, lab work & pertinent test results, reviewed documented beta blocker date and time   Airway Mallampati: I  TM Distance: >3 FB Neck ROM: Full    Dental no notable dental hx. (+) Teeth Intact, Dental Advisory Given   Pulmonary asthma , COPD,  COPD inhaler, former smoker,    Pulmonary exam normal breath sounds clear to auscultation       Cardiovascular hypertension, Pt. on home beta blockers and Pt. on medications + CAD and +CHF  Normal cardiovascular exam Rhythm:Regular Rate:Normal  TTE 2019 - Normal LV size with mild LV hypertrophy. EF 45-50%, diffuse hypokinesis. Normal RV size and systolic function. No significant valvular abnormalities.   Neuro/Psych negative neurological ROS  negative psych ROS   GI/Hepatic negative GI ROS, Neg liver ROS,   Endo/Other  negative endocrine ROSdiabetes, Type 2, Insulin Dependent  Renal/GU negative Renal ROS  negative genitourinary   Musculoskeletal negative musculoskeletal ROS (+)   Abdominal   Peds  Hematology  (+) Blood dyscrasia (Hgb 11.7), anemia ,   Anesthesia Other Findings H/o breast cancer  Reproductive/Obstetrics                           Anesthesia Physical Anesthesia Plan  ASA: III  Anesthesia Plan: MAC   Post-op Pain Management:    Induction: Intravenous  PONV Risk Score and Plan: 2 and Ondansetron and Midazolam  Airway Management Planned: Natural Airway and Simple Face Mask  Additional Equipment:   Intra-op Plan:   Post-operative Plan:   Informed Consent: I have reviewed the patients History and Physical, chart, labs and discussed the procedure including the risks, benefits and alternatives for the proposed anesthesia with the patient or authorized representative who has indicated  his/her understanding and acceptance.     Dental advisory given  Plan Discussed with: CRNA  Anesthesia Plan Comments:         Anesthesia Quick Evaluation

## 2019-02-23 NOTE — Op Note (Signed)
  PRE-OPERATIVE DIAGNOSIS:  un-needed Port-A-Cath for left breast cancer, left breast seroma (remote history of surgery 2018)  POST-OPERATIVE DIAGNOSIS:  Same   PROCEDURE:  Procedure(s):  REMOVAL PORT-A-CATH, aspiration of chronic left breast seroma  SURGEON:  Surgeon(s):  [name], MD  ANESTHESIA:   General + local  EBL:   Minimal  SPECIMEN:  None  Complications : none known  Procedure:   Pt was  identified in the holding area and taken to the operating room where she was placed supine on the operating room table.  General anesthesia was induced.  A timeout was performed according to the surgical safety checklist.    The bilateral upper chest was prepped and draped.  The prior incision on the right upper chest was anesthetized with local anesthetic.  The incision was opened with a #15 blade.  The subcutaneous tissue was divided with the cautery.  The port was identified and the capsule opened.  The four 2-0 prolene sutures were removed.  The port was then removed and pressure held on the tract.  The catheter appeared intact without evidence of breakage, length was 16 cm.  The wound was inspected for hemostasis, which was achieved with cautery.  The wound was closed with 3-0 vicryl deep dermal interrupted sutures and 4-0 Monocryl running subcuticular suture.  The wound was cleaned, dried, and dressed with dermabond.    The left breast seroma was palpated.  40 mL of murky serous fluid was aspirated.  This was dressed with a bandaid.    The patient was awakened from anesthesia and taken to the PACU in stable condition.  Needle, sponge, and instrument counts are correct.

## 2019-02-23 NOTE — H&P (Signed)
Kristin Ward is an 63 y.o. female.   Chief Complaint: h/o left breast cancer HPI:  Pt is a 63 yo F who had breast cancer and tx in 2018-2019.  She desires port removal.  Of note, she has also had chronic left breast seroma.  She was scheduled earlier this year, but had to be delayed due to covid pandemic.    Past Medical History:  Diagnosis Date  . Abnormal x-ray of lungs with single pulmonary nodule 03/15/2016   per records from [place]   . Abnormal x-ray of lungs with single pulmonary nodule 03/15/2016   per records from [place]   . Anemia   . Asthma   . CHF (congestive heart failure) ([place])   . Cholelithiasis 05/19/2017   On CT  . Coronary artery calcification seen on CAT scan 05/19/2017  . Diabetes mellitus without complication ([place])    type 2  . Dilated idiopathic cardiomyopathy ([place]) 12/2015   EF 15-20%. Diagnosed in [facility]  . Headache    NONE RECENT  . Hypertension   . Malignant neoplasm of lower-outer quadrant of left breast of female, estrogen receptor negative ([place]) 06/11/2017   left breast  . Mixed hyperlipidemia 06/17/2018  . partially blind   . Personal history of chemotherapy 2018-2019  . Personal history of radiation therapy 2019  . Retinitis pigmentosa   . Solitary pulmonary nodule on lung CT 03/16/2016   per records from [place]  . Uveitis     Past Surgical History:  Procedure Laterality Date  . brain cyst removed  2007   to help with headaches per patient , aspirated   . BREAST BIOPSY Left 06/06/2017   x2  . BREAST CYST ASPIRATION Left 06/06/2017  . BREAST LUMPECTOMY Left 06/26/2017  . BREAST LUMPECTOMY WITH RADIOACTIVE SEED AND SENTINEL LYMPH NODE BIOPSY Left 06/26/2017   Procedure: BREAST LUMPECTOMY WITH RADIOACTIVE SEED AND SENTINEL LYMPH NODE BIOPSY ERAS  PATHWAY;  Surgeon: [name], MD;  Location: [facility];  Service: General;  Laterality: Left;  pec block  . NASAL TURBINATE REDUCTION    . PORTACATH PLACEMENT N/A  06/26/2017   Procedure: INSERTION PORT-A-CATH;  Surgeon: [name], MD;  Location: [facility];  Service: General;  Laterality: N/A;  . TRANSTHORACIC ECHOCARDIOGRAM  12/2015   A) [facility] May 2017: Mild concentric LVH. Global hypokinesis. GR daily. EF 18% severe LA dilation. Mitral annular dilatation with papillary muscle dysfunction and moderate MR. Dilated IVC consistent with elevated RAP.  Marland Kitchen TRANSTHORACIC ECHOCARDIOGRAM  05/2017; 08/2017   a) Mild concentric LVH.  EF 45 to 50% with diffuse hypokinesis.  GR 1 DD.  Mild aortic root dilation.;; b)  Mild LVH EF 45 to 50%.  Diffuse HK.  No significant valve disease.  No significant change.    Family History  Problem Relation Age of Onset  . Colon cancer Mother 52       deceased 24  . Heart attack Father   . Breast cancer Other 79       2nd cousin on maternal side; currently 39  . Pancreatic cancer Other        2nd cousin once removed on maternal side; deceased 34s  . Fibromyalgia Sister   . Esophageal cancer Neg Hx   . Rectal cancer Neg Hx   . Stomach cancer Neg Hx    Social History:  reports that she quit smoking about 3 years ago. Her smoking use included cigarettes. She has a 5.75 pack-year smoking history. She has  never used smokeless tobacco. She reports that she does not drink alcohol or use drugs.  Allergies:  Allergies  Allergen Reactions  . Latex Hives and Itching    Burning   . Tylenol With Codeine #3 [Acetaminophen-Codeine] Nausea And Vomiting  . Penicillins Nausea Only, Swelling and Rash    Has patient had a PCN reaction causing immediate rash, facial/tongue/throat swelling, SOB or lightheadedness with hypotension: Yes Has patient had a PCN reaction causing severe rash involving mucus membranes or skin necrosis: Yes Has patient had a PCN reaction that required hospitalization: No Has patient had a PCN reaction occurring within the last 10 years: No TONGUE SWELLING AND RASH AROUND MOUTH If all of the above  answers are "NO", then may proceed with Cephalosporin use.     Medications Prior to Admission  Medication Sig Dispense Refill  . aspirin 81 MG chewable tablet Chew 81 mg by mouth daily.    Marland Kitchen atorvastatin (LIPITOR) 20 MG tablet Take 1 tablet (20 mg total) by mouth daily. 90 tablet 1  . carvedilol (COREG) 12.5 MG tablet Take 1 tablet (12.5 mg total) by mouth 2 (two) times daily with a meal. 180 tablet 1  . Cholecalciferol (D3-1000 PO) Take by mouth.    . insulin aspart protamine - aspart (NOVOLOG MIX 70/30 FLEXPEN) (70-30) 100 UNIT/ML FlexPen Inject 0.24 mLs (24 Units total) into the skin 2 (two) times daily. 15 mL 11  . metFORMIN (GLUCOPHAGE) 1000 MG tablet Take 1 tablet (1,000 mg total) by mouth 2 (two) times daily with a meal. 180 tablet 3  . Multiple Vitamins-Minerals (WOMENS MULTI PO) Take by mouth.    . Saccharomyces boulardii (PROBIOTIC) 250 MG CAPS Take by mouth.    . sacubitril-valsartan (ENTRESTO) 49-51 MG Take 1 tablet by mouth 2 (two) times daily. 60 tablet 6  . spironolactone (ALDACTONE) 25 MG tablet Take 1 tablet (25 mg total) by mouth daily. 90 tablet 1  . vitamin B-12 (CYANOCOBALAMIN) 500 MCG tablet Take 500 mcg by mouth daily.    . vitamin C (ASCORBIC ACID) 500 MG tablet Take 1,000 mg 2 (two) times daily by mouth.    . Accu-Chek FastClix Lancets MISC Test twice a day. Pt uses an accu-chek guide me meter 102 each 2  . albuterol (PROVENTIL HFA;VENTOLIN HFA) 108 (90 Base) MCG/ACT inhaler Inhale 2 puffs into the lungs every 6 (six) hours as needed for wheezing or shortness of breath. 1 Inhaler 0  . Blood Glucose Monitoring Suppl (PRODIGY VOICE BLOOD GLUCOSE) w/Device KIT 1 Device by Does not apply route as directed. 1 kit 0  . furosemide (LASIX) 40 MG tablet TAKE 1 TAB BY MOUTH DAILY. MAY TAKE ADDITIONAL 40 MG TABLET IF WEIGHT GAIN OF 3LBS OR MORE OVERNIGHT 40 tablet 3  . gabapentin (NEURONTIN) 300 MG capsule Take 1 capsule (300 mg total) by mouth 3 (three) times daily. (Patient not  taking: Reported on 02/01/2019) 90 capsule 1  . glucose blood test strip 2x daily 100 each 12  . loratadine (CLARITIN) 10 MG tablet Take 1 tablet (10 mg total) by mouth daily. 30 tablet 5  . potassium chloride SA (K-DUR,KLOR-CON) 20 MEQ tablet Take 20 meq tablet when taking an extra lasix tablet daily as needed 90 tablet 3  . valACYclovir HCl (VALTREX PO) Take by mouth.      Results for orders placed or performed during the hospital encounter of 02/23/19 (from the past 48 hour(s))  Glucose, capillary     Status: Abnormal   Collection  Time: 02/23/19  7:13 AM  Result Value Ref Range   Glucose-Capillary 159 (H) 70 - 99 mg/dL   No results found.  Review of Systems  All other systems reviewed and are negative.   Blood pressure 127/76, pulse 65, temperature 97.7 F (36.5 C), temperature source Oral, resp. rate 16, height 5' 7.5" (1.715 m), weight 104.3 kg, SpO2 99 %. Physical Exam  Constitutional: She is oriented to person, place, and time. She appears well-developed and well-nourished. No distress.  HENT:  Head: Normocephalic and atraumatic.  Eyes: Pupils are equal, round, and reactive to light. Conjunctivae are normal. No scleral icterus.  Neck: Normal range of motion. Neck supple.  Cardiovascular: Normal rate.  Respiratory: Effort normal. No respiratory distress. She exhibits no tenderness.  Left breast seroma  GI: Soft.  Musculoskeletal:        General: No deformity or edema.  Neurological: She is alert and oriented to person, place, and time.  Skin: Skin is warm and dry. No rash noted. She is not diaphoretic. No erythema. No pallor.  Psychiatric: She has a normal mood and affect. Her behavior is normal. Judgment and thought content normal.     Assessment/Plan H/o left breast cancer Chronic left breast seroma. Port in place.  Plan removal of port. Will attempt aspiration of left breast seroma. Risks discussed.    [name], MD 02/23/2019, 7:21 AM

## 2019-02-23 NOTE — Anesthesia Postprocedure Evaluation (Signed)
Anesthesia Post Note  Patient: Kristin Ward  Procedure(s) Performed: REMOVAL PORT-A-CATH (Right ) FINE NEEDLE ASPIRATION (Left Breast)     Patient location during evaluation: PACU Anesthesia Type: General Level of consciousness: awake and alert Pain management: pain level controlled Vital Signs Assessment: post-procedure vital signs reviewed and stable Respiratory status: spontaneous breathing, nonlabored ventilation, respiratory function stable and patient connected to nasal cannula oxygen Cardiovascular status: blood pressure returned to baseline and stable Postop Assessment: no apparent nausea or vomiting Anesthetic complications: no    Last Vitals:  Vitals:   02/23/19 0900 02/23/19 0952  BP: 130/82 136/90  Pulse: 64 71  Resp: 14 16  Temp:  36.5 C  SpO2: 98% 97%    Last Pain:  Vitals:   02/23/19 0952  TempSrc:   PainSc: 2                  Valorie Mcgrory L Aily Tzeng

## 2019-02-24 ENCOUNTER — Encounter (HOSPITAL_BASED_OUTPATIENT_CLINIC_OR_DEPARTMENT_OTHER): Payer: Self-pay | Admitting: General Surgery

## 2019-02-24 NOTE — Anesthesia Postprocedure Evaluation (Signed)
Anesthesia Post Note  Patient: Kristin Ward  Procedure(s) Performed: REMOVAL PORT-A-CATH (Right ) FINE NEEDLE ASPIRATION (Left Breast)     Patient location during evaluation: PACU Anesthesia Type: General Level of consciousness: awake and alert Pain management: pain level controlled Vital Signs Assessment: post-procedure vital signs reviewed and stable Respiratory status: spontaneous breathing, nonlabored ventilation, respiratory function stable and patient connected to nasal cannula oxygen Cardiovascular status: blood pressure returned to baseline and stable Postop Assessment: no apparent nausea or vomiting Anesthetic complications: no    Last Vitals:  Vitals:   02/23/19 0900 02/23/19 0952  BP: 130/82 136/90  Pulse: 64 71  Resp: 14 16  Temp:  36.5 C  SpO2: 98% 97%    Last Pain:  Vitals:   02/23/19 0952  TempSrc:   PainSc: 2                  Willet Schleifer L Schyler Counsell

## 2019-03-02 ENCOUNTER — Ambulatory Visit: Payer: Medicare HMO | Admitting: Dietician

## 2019-03-08 ENCOUNTER — Other Ambulatory Visit: Payer: Self-pay

## 2019-03-10 ENCOUNTER — Other Ambulatory Visit: Payer: Self-pay

## 2019-03-10 ENCOUNTER — Encounter: Payer: Self-pay | Admitting: Internal Medicine

## 2019-03-10 ENCOUNTER — Ambulatory Visit (INDEPENDENT_AMBULATORY_CARE_PROVIDER_SITE_OTHER): Payer: Medicare HMO | Admitting: Internal Medicine

## 2019-03-10 VITALS — BP 128/88 | HR 63 | Temp 97.8°F | Ht 67.0 in | Wt 239.6 lb

## 2019-03-10 DIAGNOSIS — E119 Type 2 diabetes mellitus without complications: Secondary | ICD-10-CM | POA: Diagnosis not present

## 2019-03-10 DIAGNOSIS — H543 Unqualified visual loss, both eyes: Secondary | ICD-10-CM

## 2019-03-10 MED ORDER — NOVOLOG MIX 70/30 FLEXPEN (70-30) 100 UNIT/ML ~~LOC~~ SUPN: 22.0000 [IU] | PEN_INJECTOR | Freq: Two times a day (BID) | SUBCUTANEOUS | 11 refills | Status: DC

## 2019-03-10 MED ORDER — PRODIGY VOICE BLOOD GLUCOSE W/DEVICE KIT: 1.0000 | PACK | 0 refills | Status: DC

## 2019-03-10 NOTE — Patient Instructions (Signed)
-   Continue Metformin 1000 mg Twice a day with meals - Take Novolog Mix 22 units with Breakfast and Supper    - Check Sugar before Breakfast and before Supper       HOW TO TREAT LOW BLOOD SUGARS (Blood sugar LESS THAN 70 MG/DL)  Please follow the RULE OF 15 for the treatment of hypoglycemia treatment (when your (blood sugars are less than 70 mg/dL)    STEP 1: Take 15 grams of carbohydrates when your blood sugar is low, which includes:   3-4 GLUCOSE TABS  OR  3-4 OZ OF JUICE OR REGULAR SODA OR  ONE TUBE OF GLUCOSE GEL     STEP 2: RECHECK blood sugar in 15 MINUTES STEP 3: If your blood sugar is still low at the 15 minute recheck --> then, go back to STEP 1 and treat AGAIN with another 15 grams of carbohydrates.

## 2019-03-10 NOTE — Progress Notes (Signed)
Name: Kristin Ward  Age/ Sex: 63 y.o., female   MRN/ DOB: [id number], [birth year]     PCP: Girtha Rm, NP-C   Reason for Endocrinology Evaluation: Type 2 Diabetes Mellitus  Initial Endocrine Consultative Visit: 01/27/19    PATIENT IDENTIFIER: [name] is a 63 y.o. female with a past medical history of Retinitis Pigmentosa, T2DM, HTN,macular degeneration and CHF. The patient has followed with Endocrinology clinic since 01/27/19 for consultative assistance with management of her diabetes.  DIABETIC HISTORY:  [name] was diagnosed with T2 DM in 12/2018, she presented with symptomatic hyperglycemia. She was started on Metformin, Basaglar and Tradjenta. Her hemoglobin A1c was 13.0% on initial diagnosis.   Novolog Mix started 01/2019  SUBJECTIVE:   During the last visit (01/27/2019): Stopped Basaglar and Tadjenta, we continued metformin and started Novolog mix    Today (03/10/2019): [name] is here for an 8 week follow up on diabetes.  She checks her blood sugars 2 times daily, preprandial to breakfast and evening . The patient has not had hypoglycemic episodes since the last clinic visit.Otherwise, the patient has not required any recent emergency interventions for hypoglycemia and has not had recent hospitalizations secondary to hyper or hypoglycemic episodes.    Pt has a port removed and seroma removed in 02/2019  ROS: As per HPI and as detailed below: Review of Systems  Constitutional: Negative for fever and weight loss.  HENT: Negative for congestion and sore throat.   Respiratory: Negative for cough and shortness of breath.   Cardiovascular: Negative for chest pain and palpitations.  Gastrointestinal: Negative for diarrhea and nausea.      HOME DIABETES REGIMEN:  Metformin 1000 mg BID Novolog Mix 24 units BID with meals     GLUCOSE LOG:  For the past week BG's < 190     HISTORY:  Past Medical History:  Past Medical History:  Diagnosis Date  . Abnormal x-ray of lungs with single pulmonary nodule 03/15/2016   per records from [place]   . Abnormal x-ray of lungs with single pulmonary nodule 03/15/2016   per records from [place]   . Anemia   . Asthma   . CHF (congestive heart failure) ([place])   . Cholelithiasis 05/19/2017   On CT  . Coronary artery calcification seen on CAT scan 05/19/2017  . Diabetes mellitus without complication ([place])    type 2  . Dilated idiopathic cardiomyopathy ([place]) 12/2015   EF 15-20%. Diagnosed in [facility]  . Headache    NONE RECENT  . Hypertension   . Malignant neoplasm of lower-outer quadrant of left breast of female, estrogen receptor negative ([place]) 06/11/2017   left breast  . Mixed hyperlipidemia 06/17/2018  . partially blind   . Personal history of chemotherapy 2018-2019  . Personal history of radiation therapy 2019  . Retinitis pigmentosa   . Solitary pulmonary nodule on lung CT 03/16/2016   per records from [place]  . Uveitis    Past Surgical History:  Past Surgical History:  Procedure Laterality Date  . brain cyst removed  2007   to help with headaches per patient , aspirated   . BREAST BIOPSY Left 06/06/2017   x2  . BREAST CYST ASPIRATION Left 06/06/2017  . BREAST LUMPECTOMY Left 06/26/2017  . BREAST LUMPECTOMY WITH RADIOACTIVE SEED AND SENTINEL LYMPH NODE BIOPSY Left 06/26/2017   Procedure: BREAST LUMPECTOMY WITH RADIOACTIVE SEED AND SENTINEL LYMPH NODE BIOPSY ERAS  PATHWAY;  Surgeon: [name], MD;  Location: [facility]  OR;  Service: General;  Laterality: Left;  pec block  . FINE NEEDLE ASPIRATION Left 02/23/2019   Procedure: FINE NEEDLE ASPIRATION;  Surgeon: [name], MD;  Location: [facility];  Service: General;  Laterality: Left;  Asiration of left breast seroma   . NASAL TURBINATE REDUCTION    . PORT-A-CATH REMOVAL Right 02/23/2019   Procedure: REMOVAL  PORT-A-CATH;  Surgeon: [name], MD;  Location: [facility];  Service: General;  Laterality: Right;  . PORTACATH PLACEMENT N/A 06/26/2017   Procedure: INSERTION PORT-A-CATH;  Surgeon: [name], MD;  Location: [facility];  Service: General;  Laterality: N/A;  . TRANSTHORACIC ECHOCARDIOGRAM  12/2015   A) [facility] May 2017: Mild concentric LVH. Global hypokinesis. GR daily. EF 18% severe LA dilation. Mitral annular dilatation with papillary muscle dysfunction and moderate MR. Dilated IVC consistent with elevated RAP.  Marland Kitchen TRANSTHORACIC ECHOCARDIOGRAM  05/2017; 08/2017   a) Mild concentric LVH.  EF 45 to 50% with diffuse hypokinesis.  GR 1 DD.  Mild aortic root dilation.;; b)  Mild LVH EF 45 to 50%.  Diffuse HK.  No significant valve disease.  No significant change.    Social History:  reports that she quit smoking about 3 years ago. Her smoking use included cigarettes. She has a 5.75 pack-year smoking history. She has never used smokeless tobacco. She reports that she does not drink alcohol or use drugs. Family History:  Family History  Problem Relation Age of Onset  . Colon cancer Mother 28       deceased 49  . Heart attack Father   . Breast cancer Other 73       2nd cousin on maternal side; currently 10  . Pancreatic cancer Other        2nd cousin once removed on maternal side; deceased 48s  . Fibromyalgia Sister   . Esophageal cancer Neg Hx   . Rectal cancer Neg Hx   . Stomach cancer Neg Hx      HOME MEDICATIONS: Allergies as of 03/10/2019      Reactions   Latex Hives, Itching   Burning    Tylenol With Codeine #3 [acetaminophen-codeine] Nausea And Vomiting   Penicillins Nausea Only, Swelling, Rash   Has patient had a PCN reaction causing immediate rash, facial/tongue/throat swelling, SOB or lightheadedness with hypotension: Yes Has patient had a PCN reaction causing severe rash involving mucus membranes or skin necrosis: Yes Has patient had a PCN  reaction that required hospitalization: No Has patient had a PCN reaction occurring within the last 10 years: No TONGUE SWELLING AND RASH AROUND MOUTH If all of the above answers are "NO", then may proceed with Cephalosporin use.      Medication List       Accurate as of March 10, 2019  8:42 AM. If you have any questions, ask your nurse or doctor.        Accu-Chek FastClix Lancets Misc Test twice a day. Pt uses an accu-chek guide me meter   albuterol 108 (90 Base) MCG/ACT inhaler Commonly known as: VENTOLIN HFA Inhale 2 puffs into the lungs every 6 (six) hours as needed for wheezing or shortness of breath.   aspirin 81 MG chewable tablet Chew 81 mg by mouth daily.   atorvastatin 20 MG tablet Commonly known as: LIPITOR Take 1 tablet (20 mg total) by mouth daily.   carvedilol 12.5 MG tablet Commonly known as: COREG Take 1 tablet (12.5 mg total) by mouth 2 (two) times  daily with a meal.   D3-1000 PO Take by mouth.   furosemide 40 MG tablet Commonly known as: LASIX TAKE 1 TAB BY MOUTH DAILY. MAY TAKE ADDITIONAL 40 MG TABLET IF WEIGHT GAIN OF 3LBS OR MORE OVERNIGHT   gabapentin 300 MG capsule Commonly known as: NEURONTIN Take 1 capsule (300 mg total) by mouth 3 (three) times daily.   glucose blood test strip 2x daily   loratadine 10 MG tablet Commonly known as: CLARITIN Take 1 tablet (10 mg total) by mouth daily.   metFORMIN 1000 MG tablet Commonly known as: GLUCOPHAGE Take 1 tablet (1,000 mg total) by mouth 2 (two) times daily with a meal.   NovoLOG Mix 70/30 FlexPen (70-30) 100 UNIT/ML FlexPen Generic drug: insulin aspart protamine - aspart Inject 0.24 mLs (24 Units total) into the skin 2 (two) times daily.   oxyCODONE 5 MG immediate release tablet Commonly known as: Oxy IR/ROXICODONE Take 1 tablet (5 mg total) by mouth every 6 (six) hours as needed for severe pain.   potassium chloride SA 20 MEQ tablet Commonly known as: K-DUR Take 20 meq tablet when taking  an extra lasix tablet daily as needed   Probiotic 250 MG Caps Take by mouth.   Prodigy Voice Blood Glucose w/Device Kit 1 Device by Does not apply route as directed.   sacubitril-valsartan 49-51 MG Commonly known as: Entresto Take 1 tablet by mouth 2 (two) times daily.   spironolactone 25 MG tablet Commonly known as: ALDACTONE Take 1 tablet (25 mg total) by mouth daily.   VALTREX PO Take by mouth.   vitamin B-12 500 MCG tablet Commonly known as: CYANOCOBALAMIN Take 500 mcg by mouth daily.   vitamin C 500 MG tablet Commonly known as: ASCORBIC ACID Take 1,000 mg 2 (two) times daily by mouth.   WOMENS MULTI PO Take by mouth.        OBJECTIVE:   Vital Signs: BP 128/88 (BP Location: Right Arm, Patient Position: Sitting, Cuff Size: Normal)   Pulse 63   Temp 97.8 F (36.6 C)   Ht _0  (1.702 m)   Wt 239 lb 9.6 oz (108.7 kg)   SpO2 98%   BMI 37.53 kg/m   Wt Readings from Last 3 Encounters:  03/10/19 239 lb 9.6 oz (108.7 kg)  02/23/19 229 lb 15 oz (104.3 kg)  02/01/19 234 lb 1.6 oz (106.2 kg)     Exam: General: Pt appears well and is in NAD  Lungs: Clear with good BS bilat with no rales, rhonchi, or wheezes  Heart: RRR with normal S1 and S2 and no gallops; no murmurs; no rub  Abdomen: Normoactive bowel sounds, soft, nontender, without masses or organomegaly palpable  Extremities: No pretibial edema. No tremor.   Skin: Normal texture and temperature to palpation. No rash noted. No Acanthosis nigricans/skin tags. No lipohypertrophy.  Neuro: MS is good with appropriate affect, pt is alert and Ox3         DM foot exam: 01/27/2019  The skin of the feet is intact without sores or ulcerations. The pedal pulses are 2+ on right and 2+ on left. The sensation is intact to a screening 5.07, 10 gram monofilament bilaterally   DATA REVIEWED:  Lab Results  Component Value Date   HGBA1C 13.0 (A) 12/17/2018   Lab Results  Component Value Date   LDLCALC 103 (H)  12/17/2018   CREATININE 0.97 02/01/2019   No results found for: [facility]   Lab Results  Component Value Date  CHOL 176 12/17/2018   HDL 29 (L) 12/17/2018   LDLCALC 103 (H) 12/17/2018   TRIG 220 (H) 12/17/2018   CHOLHDL 6.1 (H) 12/17/2018         ASSESSMENT / PLAN / RECOMMENDATIONS:   1) Type 2 Diabetes Mellitus, Newly Diagnosed, Without complications - Most recent A1c of 13.0 %. Goal A1c < 7.0 %.    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 Pt to contact us once she receives it and will schedule her to see CDE for instruction  - Her BG's have improved dramatically , staying less then 180 mg/dL for the most part, per daughter pt did not take her insulin one day and her Bg was 108. No hypoglycemia so far but given hx of tight BG's will reduce insulin dose a little - Will consider add-on therapy in the future in the hopes of stopping insulin.  - Pt to contact us with BG's < 70 mg/dL or if notices tight BG's     MEDICATIONS:  Decrease Novolog MIX 22 units BID with meals  Continue Metformin 1000 mg BID   EDUCATION / INSTRUCTIONS:  BG monitoring instructions: Patient is instructed to check her blood sugars 2 times a day, fasting and supper.  Call [facility] if: BG persistently < 70 or > 300. . I reviewed the Rule of 15 for the treatment of hypoglycemia in detail with the patient. Literature supplied.    F/U in 3 month   Signed electronically by: Mack Guise, MD  [facility]  [address] [place] [employer], [place] [address] Phone: [phone] FAX: [phone]   CC: Girtha Rm, NP-C [place] [address] Phone: [phone]  Fax: [phone]  Return to Endocrinology clinic as below: Future Appointments   Date Time Provider [place]  03/10/2019  9:10 AM , Melanie Crazier, MD [facility] None  03/18/2019 10:30 AM Christella Hartigan, RD [place] NDM  04/02/2019 11:00 AM [facility] LAB 2 [facility] None  04/02/2019 11:15 AM [facility] [place] [facility] None  05/07/2019 10:00 AM [name], MD [facility]  06/03/2019 11:00 AM [facility] DX DEXA 1 [facility] GI-BREAST CE  06/03/2019 11:40 AM [facility] DIAG TOMO 1 [facility]-BCGMM GI-BREAST CE  08/02/2019 10:30 AM [facility] LAB 2 [facility] None  08/02/2019 10:45 AM [facility] [place] None  08/02/2019 11:15 AM [name], MD [facility] None

## 2019-03-18 ENCOUNTER — Ambulatory Visit: Payer: Medicare HMO | Admitting: Registered"

## 2019-04-01 ENCOUNTER — Other Ambulatory Visit: Payer: Self-pay

## 2019-04-01 DIAGNOSIS — C50512 Malignant neoplasm of lower-outer quadrant of left female breast: Secondary | ICD-10-CM

## 2019-04-02 ENCOUNTER — Other Ambulatory Visit: Payer: Self-pay

## 2019-04-02 ENCOUNTER — Inpatient Hospital Stay: Payer: Medicare HMO | Attending: Hematology

## 2019-04-02 ENCOUNTER — Other Ambulatory Visit: Payer: Medicare HMO

## 2019-04-02 DIAGNOSIS — Z853 Personal history of malignant neoplasm of breast: Secondary | ICD-10-CM | POA: Diagnosis not present

## 2019-04-02 DIAGNOSIS — Z171 Estrogen receptor negative status [ER-]: Secondary | ICD-10-CM

## 2019-04-02 DIAGNOSIS — D649 Anemia, unspecified: Secondary | ICD-10-CM

## 2019-04-02 DIAGNOSIS — C50512 Malignant neoplasm of lower-outer quadrant of left female breast: Secondary | ICD-10-CM

## 2019-04-02 LAB — CBC WITH DIFFERENTIAL/PLATELET
Abs Immature Granulocytes: 0.02 10*3/uL (ref 0.00–0.07)
Basophils Absolute: 0 10*3/uL (ref 0.0–0.1)
Basophils Relative: 0 %
Eosinophils Absolute: 0.2 10*3/uL (ref 0.0–0.5)
Eosinophils Relative: 4 %
HCT: 37.2 % (ref 36.0–46.0)
Hemoglobin: 12.3 g/dL (ref 12.0–15.0)
Immature Granulocytes: 0 %
Lymphocytes Relative: 34 %
Lymphs Abs: 1.9 10*3/uL (ref 0.7–4.0)
MCH: 30 pg (ref 26.0–34.0)
MCHC: 33.1 g/dL (ref 30.0–36.0)
MCV: 90.7 fL (ref 80.0–100.0)
Monocytes Absolute: 0.4 10*3/uL (ref 0.1–1.0)
Monocytes Relative: 7 %
Neutro Abs: 3.1 10*3/uL (ref 1.7–7.7)
Neutrophils Relative %: 55 %
Platelets: 314 10*3/uL (ref 150–400)
RBC: 4.1 MIL/uL (ref 3.87–5.11)
RDW: 12.7 % (ref 11.5–15.5)
WBC: 5.6 10*3/uL (ref 4.0–10.5)
nRBC: 0 % (ref 0.0–0.2)

## 2019-04-02 LAB — COMPREHENSIVE METABOLIC PANEL
ALT: 16 U/L (ref 0–44)
AST: 15 U/L (ref 15–41)
Albumin: 3.9 g/dL (ref 3.5–5.0)
Alkaline Phosphatase: 111 U/L (ref 38–126)
Anion gap: 9 (ref 5–15)
BUN: 12 mg/dL (ref 6–20)
CO2: 22 mmol/L (ref 22–32)
Calcium: 9.2 mg/dL (ref 8.9–10.3)
Chloride: 110 mmol/L (ref 98–111)
Creatinine, Ser: 0.79 mg/dL (ref 0.44–1.00)
GFR calc Af Amer: >60 mL/min (ref >60)
GFR calc non Af Amer: >60 mL/min (ref >60)
Glucose, Bld: 124 mg/dL — ABNORMAL HIGH (ref 70–99)
Potassium: 3.9 mmol/L (ref 3.5–5.1)
Sodium: 141 mmol/L (ref 135–145)
Total Bilirubin: 0.4 mg/dL (ref 0.3–1.2)
Total Protein: 7.3 g/dL (ref 6.5–8.1)

## 2019-04-02 LAB — IRON AND TIBC
Iron: 64 ug/dL (ref 41–142)
Saturation Ratios: 20 % — ABNORMAL LOW (ref 21–57)
TIBC: 321 ug/dL (ref 236–444)
UIBC: 258 ug/dL (ref 120–384)

## 2019-04-02 LAB — VITAMIN B12: Vitamin B-12: 745 pg/mL (ref 180–914)

## 2019-04-02 LAB — FERRITIN: Ferritin: 142 ng/mL (ref 11–307)

## 2019-04-03 ENCOUNTER — Other Ambulatory Visit: Payer: Self-pay | Admitting: Family Medicine

## 2019-04-03 DIAGNOSIS — E119 Type 2 diabetes mellitus without complications: Secondary | ICD-10-CM

## 2019-04-05 ENCOUNTER — Telehealth: Payer: Self-pay

## 2019-04-05 LAB — FOLATE RBC
Folate, Hemolysate: 459 ng/mL
Folate, RBC: 1237 ng/mL (ref >498)
Hematocrit: 37.1 % (ref 34.0–46.6)

## 2019-04-05 LAB — METHYLMALONIC ACID, SERUM: Methylmalonic Acid, Quantitative: 124 nmol/L (ref 0–378)

## 2019-04-05 NOTE — Telephone Encounter (Signed)
-----   Message from Alla Feeling, NP sent at 04/04/2019  9:02 PM EDT ----- Please let her know hemoglobin is normal now, no anemia. Iron and B12 are norma. She can take daily multivitamin with iron.  Thanks, Regan Rakers

## 2019-04-05 NOTE — Telephone Encounter (Signed)
Called patient to inform her of information from Cira Rue, NP below. Pt verbalized understanding.

## 2019-04-07 ENCOUNTER — Encounter: Payer: Medicare HMO | Attending: Family Medicine | Admitting: Registered"

## 2019-04-07 ENCOUNTER — Other Ambulatory Visit: Payer: Self-pay

## 2019-04-07 DIAGNOSIS — E119 Type 2 diabetes mellitus without complications: Secondary | ICD-10-CM | POA: Diagnosis not present

## 2019-04-07 NOTE — Progress Notes (Signed)
Diabetes Self-Management Education  Visit Type:    Appt. Start Time: 1415 Appt. End Time: [id number]  04/07/2019  [name], identified by name and date of birth, is a 63 y.o. female with a diagnosis of Diabetes:    ASSESSMENT  DSME Follow-Up Appointment: Pt alone for appointment. Pt was last in office on 01/21/2019 for initial DSME appointment with provider Alvester Morin, MS, RDN, LDN.   Pt reports things have been going well. Pt reports that one day she didn't take her medication and her blood sugar was 192. Reports her daughter was sick earlier this week and she was not able to take her insulin. Reports she has not taken her insulin since the beginning of the week. Reports fasting blood sugar has been between 103-117 since she has not been taking insulin. Reports highest after meals has been around 120. Reports she needs to call her endocrinologist to let them know she has not been taking the insulin. Pt reports she plans to call tomorrow.  Pt reports she watches her diet and includes a lot of herbs in her diet. Reports that she includes a lot of alkaline foods and does not include many fatty foods. Pt reports she eats 2 times per day, breakfast and dinner. Pt eats breakfast between 6-8 AM, lunch around 12-1 PM, and snack around 5PM and may have another snack such as graham crackers. If she eats after 6 PM will be a snack. Reports she has decreased her portion sizes.  Pt includes fish, chicken, beef occasionally. Does not include pork, eggs, or peanuts. Mostly includes beans, chick peas, tofu, tree nuts (plant proteins). Reports she does either rice, coconut, or almond milk.   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 She feels her blood sugar increased previously d/t the sarcoma.   24-Hour Recall: Morning: Silk almond yogurt, vegetarian bacon, oatmeal, water Lunch (2PM): vegetarian chili (black beans, corn, vegetarian meat),  rice, corn bread, spinach Dinner 6-7 PM: Teddy Grahams, smoothie (mango 1/2, 1 banana, scoop of almonds, 2 scoop Mikey Kirschner)   Physical Activity: Pt includes videos for exercise and does yoga as well as walking for hours x 4 days per week.   Individualized Plan for Diabetes Self-Management Training:  Learning Objective:  Patient will have a greater understanding of diabetes self-management. Patient education plan is to attend individual and/or group sessions per assessed needs and concerns.   Plan: Dietitian discussed whether pt has any further questions regarding DSME. Discussed that stress, including that from illness, can cause increases in blood sugar d/t stress hormones. Reviewed carbohydrate counting. Discussed plant based protein and dairy. Recommended trying a pea milk or Silk + protein milk to include more protein. Discussed trying almond or Sunbutter as pt reports she does not eat peanuts. Strongly encouraged pt to call her endocrinologist for their recommendation before starting back her insulin as starting back on insulin could cause low blood sugars given reports of target blood sugars currently while not taking insulin. Pt reports she plans to do so tomorrow. Pt appeared agreeable to information/goals discussed.   Instructions/Goals:    Avoid going longer than 4-5 hours without eating. If unable to have a whole meal-have a small meal or balanced snack including at least a carbohydrate and protein source to help balance blood sugar and prevent lows (see handout).    Try to include around the same amount of carbohydrates at each meal (see handout). Recommend about 2-3  carbohydrate choices (30-45 g carbohydrates) per meal (see yellow handout)    Recommend trying Silk Protein milk for added protein and trying Sunbutter or almond butter for a easy protein to add with fruit.     Continue checking blood sugar regularly per MD recommendations.     Contact Endocrinologist  regarding recent blood sugar readings and to let them know you have not been taking your insulin. With your readings being very tightly in range recommend calling your doctor as soon as you can before starting back on insulin to receive their recommendations first to help prevent it from going too low.   Expected Outcomes:     Education material provided: Meal plan card and Snack sheet   If problems or questions, patient to contact team via:  Phone and Email  Future DSME appointment:  3 months

## 2019-04-07 NOTE — Patient Instructions (Addendum)
Instructions/Goals:    Avoid going longer than 4-5 hours without eating. If unable to have a whole meal-have a small meal or balanced snack including at least a carbohydrate and protein source to help balance blood sugar and prevent lows (see handout).    Try to include around the same amount of carbohydrates at each meal (see handout). Recommend about 2-3 carbohydrate choices (30-45 g carbohydrates) per meal (see yellow handout)    Recommend trying Silk Protein milk for added protein and trying Sunbutter or almond butter for a easy protein to add with fruit.     Continue checking blood sugar regularly per MD recommendations.     Contact Endocrinologist regarding recent blood sugar readings and to let them know you have not been taking your insulin. With your readings being very tightly in range recommend calling your doctor as soon as you can before starting back on insulin to receive their recommendations first to help prevent it from going too low.

## 2019-04-12 ENCOUNTER — Other Ambulatory Visit: Payer: Self-pay

## 2019-04-12 ENCOUNTER — Encounter: Payer: Self-pay | Admitting: Family Medicine

## 2019-04-12 ENCOUNTER — Telehealth: Payer: Self-pay | Admitting: Family Medicine

## 2019-04-12 ENCOUNTER — Ambulatory Visit (INDEPENDENT_AMBULATORY_CARE_PROVIDER_SITE_OTHER): Payer: Medicare HMO | Admitting: Family Medicine

## 2019-04-12 VITALS — BP 128/70 | Wt 240.0 lb

## 2019-04-12 DIAGNOSIS — M25551 Pain in right hip: Secondary | ICD-10-CM

## 2019-04-12 NOTE — Telephone Encounter (Signed)
Pt is doing virtual visit today

## 2019-04-12 NOTE — Telephone Encounter (Signed)
Pt called and states that she is having sharp side pain. She thinks it is related to her medications. She states that her diabetes meds were recently increased and she has started on Lipitor. The pain started around the time of medication changes. Please advise pt at [phone].

## 2019-04-12 NOTE — Telephone Encounter (Signed)
She should have a virtual or in office visit for her pain.
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
## 2019-04-26 DIAGNOSIS — E119 Type 2 diabetes mellitus without complications: Secondary | ICD-10-CM | POA: Diagnosis not present

## 2019-04-26 DIAGNOSIS — H3552 Pigmentary retinal dystrophy: Secondary | ICD-10-CM | POA: Diagnosis not present

## 2019-04-26 DIAGNOSIS — H2513 Age-related nuclear cataract, bilateral: Secondary | ICD-10-CM | POA: Diagnosis not present

## 2019-04-26 LAB — HM DIABETES EYE EXAM

## 2019-05-07 ENCOUNTER — Telehealth (INDEPENDENT_AMBULATORY_CARE_PROVIDER_SITE_OTHER): Payer: Medicare HMO | Admitting: Cardiology

## 2019-05-07 ENCOUNTER — Telehealth: Payer: Self-pay | Admitting: *Deleted

## 2019-05-07 ENCOUNTER — Encounter: Payer: Self-pay | Admitting: Cardiology

## 2019-05-07 VITALS — BP 142/91 | HR 78 | Ht 66.0 in | Wt 248.0 lb

## 2019-05-07 DIAGNOSIS — I420 Dilated cardiomyopathy: Secondary | ICD-10-CM | POA: Diagnosis not present

## 2019-05-07 DIAGNOSIS — I5042 Chronic combined systolic (congestive) and diastolic (congestive) heart failure: Secondary | ICD-10-CM

## 2019-05-07 DIAGNOSIS — E669 Obesity, unspecified: Secondary | ICD-10-CM | POA: Diagnosis not present

## 2019-05-07 DIAGNOSIS — I10 Essential (primary) hypertension: Secondary | ICD-10-CM | POA: Diagnosis not present

## 2019-05-07 DIAGNOSIS — I2510 Atherosclerotic heart disease of native coronary artery without angina pectoris: Secondary | ICD-10-CM | POA: Diagnosis not present

## 2019-05-07 DIAGNOSIS — E782 Mixed hyperlipidemia: Secondary | ICD-10-CM

## 2019-05-07 DIAGNOSIS — R9439 Abnormal result of other cardiovascular function study: Secondary | ICD-10-CM

## 2019-05-07 DIAGNOSIS — Z6841 Body Mass Index (BMI) 40.0 and over, adult: Secondary | ICD-10-CM | POA: Diagnosis not present

## 2019-05-07 NOTE — Assessment & Plan Note (Signed)
Her EF has definitely improved on aggressive medication. She continues to however require more Lasix and I think that the diabetes effect is throwing off her weights.  She was down to the 230s which looked great but now she is up to 249. I think we should need to set a dry weight somewhere in the happy medium between those 2.  New target dry weight between 242 in 243 pounds -> this would mean that she needs increase her Lasix per instructions noted below

## 2019-05-07 NOTE — Assessment & Plan Note (Signed)
She gains weight and loses weight and is very difficult to understand whether this is related to diet or not.  Her diet seems to be relatively strange but does not seem to be eating any more than usual.  She was thrown off by diabetes and then being placed on insulin.  I would like to see what her weight finally normalizes out as.  We are setting a new target dry weight for now at 243.

## 2019-05-07 NOTE — Assessment & Plan Note (Signed)
Notably improved EF by echo.  She is not having any angina symptoms.  For now we will continue to monitor.  Plan is eventually consider coronary CT angiogram.

## 2019-05-07 NOTE — Telephone Encounter (Signed)
SPOKE TO PATIENT. INSTRUCTION WAS GIVEN FROM TODAY'S VIRTUAL VISIT.  AVS SUMMARY sent via [place].  PATIENT VERBALIZED UNDERSTANDING.

## 2019-05-07 NOTE — Patient Instructions (Addendum)
Medication Instructions:   For the furosemide/Lasix, let us set your new dry weight to 242-243 pounds.  That means that you are still up a little bit from where we want you to be.  If you are currently taking twice a day Lasix 40 mg you with increased to 80 mg in the morning 40 mg in the afternoon for 3 days to see if it g+o to back down.  Take a couple days back to regular dose before doubling up the morning dose again. ->  Once you get to the dry weight we will see if we can stay on the stable dose of once daily Lasix with the additional dose for weight gain greater than 3 pounds. --Remember when taking the additional doses of Lasix to take the extra potassium tablet  With your Entresto -> the plan is to increase your dose to the next dosing interval.  For now but I want you to do is take 2 tablets twice a day and monitor your blood pressure.  Over the first week if your blood pressure is running stable and not below 100 mmHg (for the top number) let us know here at the office and what we will do is send a prescription in for the new dose of 97/103 mg and we will give you a 90-day supply.   If you need a refill on your cardiac medications before your next appointment, please call your pharmacy.   Lab work:  If your primary care doctor is checking blood work routinely because of diabetes, it is important that we make sure that she is also checking her kidney function.  If not we need a chemistry panel prior to follow-up visit  If you have labs (blood work) drawn today and your tests are completely normal, you will receive your results only by: Marland Kitchen MyChart Message (if you have MyChart) OR . A paper copy in the mail If you have any lab test that is abnormal or we need to change your treatment, we will call you to review the results.  Testing/Procedures:   No new tests  Follow-Up: At [facility], you and your health needs are our priority.  As part of our continuing mission to provide you with  exceptional heart care, we have created designated Provider Care Teams.  These Care Teams include your primary Cardiologist (physician) and Advanced Practice Providers (APPs -  Physician Assistants and Nurse Practitioners) who all work together to provide you with the care you need, when you need it. . You will need a follow up appointment in    ~3  Months (you can decide whether you want to be virtual or in person).  Please call our office 2 months in advance to schedule this appointment.  You may see Glenetta Hew, MD or one of the following Advanced Practice Providers on your designated Care Team:   . Rosaria Ferries, PA-C . Jory Sims, DNP, ANP  Any Other Special Instructions Will Be Listed Below (If Applicable).

## 2019-05-07 NOTE — Assessment & Plan Note (Signed)
No active angina, improved EF.  Continue current meds for now. Target LDL less than 100 but closer to 17  Were she to have worsening symptoms, would consider coronary CT angiogram

## 2019-05-07 NOTE — Assessment & Plan Note (Signed)
LDL is 100 but last check.  Pretty close to where we want to be for now.  Continue atorvastatin and reevaluate in 6 months.

## 2019-05-07 NOTE — Assessment & Plan Note (Signed)
Still hypertensive, and with the need for increased diuretic, I plan is to increase Entresto.  She will take double dose of the current pills for the next 2 weeks.  After 1 week as long she is not noting hypotension, we will simply increase in the 97/103 mg dose.

## 2019-05-07 NOTE — Progress Notes (Signed)
Virtual Visit via Video Note   This visit type was conducted due to national recommendations for restrictions regarding the COVID-19 Pandemic (e.g. social distancing) in an effort to limit this patient's exposure and mitigate transmission in our community.  Due to her co-morbid illnesses, this patient is at least at moderate risk for complications without adequate follow up.  This format is felt to be most appropriate for this patient at this time.  All issues noted in this document were discussed and addressed.  A limited physical exam was performed with this format.  Please refer to the patient's chart for her consent to telehealth for [facility].   Patient has given verbal permission to conduct this visit via virtual appointment and to bill insurance 05/07/2019 5:34 PM     Evaluation Performed:  Follow-up visit  Date:  05/07/2019   ID:  Kristin, Ward [birth year], MRN [id number]  Patient Location: Home Provider Location: Office  PCP:  Girtha Rm, NP-C  Cardiologist:  Glenetta Hew, MD  Electrophysiologist:  None   Chief Complaint: 74-monthfollow-up -cardiomyopathy, hypertension  History of Present Illness:    PShaquanta Harklessis a 63y.o. female with PMH of breast cancer (treated from 2018-2019 with Cytoxan and docetaxel as well as adjuvant XRT) with mildly reduced cardiac function by echocardiogram in January 2018 showing EF of 45 to 50%.  She now presents via aEngineer, civil (consulting)for a telehealth visit today.  I last saw her in January 2020.  She had notably improved symptoms of dyspnea, but had run out of her medications.  She had gained about 7 pounds and her weight was up to 247 pounds. -->  I increased her Lasix dose to 80 mg a day for 3 days and then alternating 40 mg and 8 mg until her weight went down.  PIsabellarose Ward was last seen by KJory Sims NP via telemedicine on Dec 28, 2018 -> she noted that she had just been diagnosed with  diabetes.  She was continuing on Entresto, carvedilol and noted that her weight was down to 235 pounds.  BP well controlled.  Was then back to her baseline diuretic dose.Has monthly labs done by PCP -currently following CBGs. --> She had been walking every day.  Unable to go to gym visit COVID-19.    She had Port-A-Cath removal in July by Dr. BBarry Dienes  This has been in place for her treatment of breast cancer from 20 18-20 19.  No new cardiac studies and no recent hospitalizations beside having the port removed.  PCP settled on Novolog & Metformin 70/30  Interval History:  [facility] - swimming 5 d per week.  Despite this, she gained back some once the weight that she lost.  When she was first diagnosed with diabetes, her weight elevated at 235 pounds now she is back up into the 240s.  It does not seem like her edema is any worse.  She is not noticing any more PND orthopnea type symptoms. She does note that she feels better when she takes the Lasix but the only issue is with that she has strange formation of her stools where she occasionally has to use an enema. Otherwise cardiac review of symptoms is pretty normal.  Besides some exertional dyspnea when she overdoes it, she does not note really that much in the way of any PND but does have some mild orthopnea.  Remainder of cardiovascular  review of symptoms: positive for - dyspnea on exertion, edema, orthopnea and Weight gain negative for - irregular heartbeat, palpitations, paroxysmal nocturnal dyspnea, rapid heart rate, shortness of breath or Syncope/near syncope, TIA/amaurosis fugax  The patient does not have symptoms concerning for COVID-19 infection (fever, chills, cough, or new shortness of breath).  The patient is practicing social distancing.  ROS:  Please see the history of present illness.    Review of Systems  Constitutional: Negative for malaise/fatigue and weight loss (She initially had  weight loss and then she gained weight back again very difficult for her to understand.).  HENT: Negative for congestion and nosebleeds.   Respiratory: Positive for shortness of breath (Off and on).   Gastrointestinal: Positive for constipation (She thinks that Lasix has curdling affect on her stool). Negative for heartburn.  Genitourinary: Positive for frequency.  Musculoskeletal: Negative for joint pain.  Neurological: Positive for dizziness (Off-and-on but not routinely). Negative for focal weakness and weakness.  Psychiatric/Behavioral: The patient has insomnia (Has a hard time going back to sleep).   All other systems reviewed and are negative. . The patient does not have symptoms concerning for COVID-19 infection (fever, chills, cough, or new shortness of breath).  The patient is practicing social distancing.   COVID-19 Education: The signs and symptoms of COVID-19 were discussed with the patient and how to seek care for testing (follow up with PCP or arrange E-visit).   The importance of social distancing was discussed today.   Past Medical History:  Diagnosis Date   Abnormal x-ray of lungs with single pulmonary nodule 03/15/2016   per records from [place]    Abnormal x-ray of lungs with single pulmonary nodule 03/15/2016   per records from [place]    Anemia    Asthma    CHF (congestive heart failure) ([place])    Cholelithiasis 05/19/2017   On CT   Coronary artery calcification seen on CAT scan 05/19/2017   Diabetes mellitus without complication ([place])    type 2   Dilated idiopathic cardiomyopathy ([place]) 12/2015   EF 15-20%. 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 of female, estrogen receptor negative ([place]) 06/11/2017   left breast   Mixed hyperlipidemia 06/17/2018   partially blind    Personal history of chemotherapy 2018-2019   Personal history of  radiation therapy 2019   Retinitis pigmentosa    Solitary pulmonary nodule on lung CT 03/16/2016   per records from [place]   Uveitis    Past Surgical History:  Procedure Laterality Date   brain cyst removed  2007   to help with headaches per patient , aspirated    BREAST BIOPSY Left 06/06/2017   x2   BREAST CYST ASPIRATION Left 06/06/2017   BREAST LUMPECTOMY Left 06/26/2017   BREAST LUMPECTOMY WITH RADIOACTIVE SEED AND SENTINEL LYMPH NODE BIOPSY Left 06/26/2017   Procedure: BREAST LUMPECTOMY WITH RADIOACTIVE SEED AND SENTINEL LYMPH NODE BIOPSY ERAS  PATHWAY;  Surgeon: [name], MD;  Location: [place];  Service: General;  Laterality: Left;  pec block   FINE NEEDLE ASPIRATION Left 02/23/2019   Procedure: FINE NEEDLE ASPIRATION;  Surgeon: [name], MD;  Location: [place];  Service: General;  Laterality: Left;  Asiration of left breast seroma    NASAL TURBINATE REDUCTION     PORT-A-CATH REMOVAL Right 02/23/2019   Procedure: REMOVAL PORT-A-CATH;  Surgeon: [name], MD;  Location: [place];  Service: General;  Laterality: Right;   PORTACATH PLACEMENT N/A 06/26/2017   Procedure: INSERTION PORT-A-CATH;  Surgeon: [name], MD;  Location: [place];  Service: General;  Laterality: N/A;   TRANSTHORACIC ECHOCARDIOGRAM  12/2015   A) [facility] May 2017: Mild concentric LVH. Global hypokinesis. GR daily. EF 18% severe LA dilation. Mitral annular dilatation with papillary muscle dysfunction and moderate MR. Dilated IVC consistent with elevated RAP.   TRANSTHORACIC ECHOCARDIOGRAM  05/2017; 08/2017   a) Mild concentric LVH.  EF 45 to 50% with diffuse hypokinesis.  GR 1 DD.  Mild aortic root dilation.;; b)  Mild LVH EF 45 to 50%.  Diffuse HK.  No significant valve disease.  No significant change.     Current Meds  Medication Sig   Accu-Chek FastClix Lancets MISC Test twice a day. Pt uses an accu-chek guide me meter   albuterol  (PROVENTIL HFA;VENTOLIN HFA) 108 (90 Base) MCG/ACT inhaler Inhale 2 puffs into the lungs every 6 (six) hours as needed for wheezing or shortness of breath.   aspirin 81 MG chewable tablet Chew 81 mg by mouth daily.   atorvastatin (LIPITOR) 20 MG tablet Take 1 tablet (20 mg total) by mouth daily.   Blood Glucose Monitoring Suppl (PRODIGY VOICE BLOOD GLUCOSE) w/Device KIT 1 Device by Does not apply route as directed.   carvedilol (COREG) 12.5 MG tablet Take 1 tablet (12.5 mg total) by mouth 2 (two) times daily with a meal.   Cholecalciferol (D3-1000 PO) Take by mouth.   furosemide (LASIX) 40 MG tablet TAKE 1 TAB BY MOUTH DAILY. MAY TAKE ADDITIONAL 40 MG TABLET IF WEIGHT GAIN OF 3LBS OR MORE OVERNIGHT   gabapentin (NEURONTIN) 300 MG capsule Take 1 capsule (300 mg total) by mouth 3 (three) times daily.   glucose blood test strip 2x daily   insulin aspart protamine - aspart (NOVOLOG MIX 70/30 FLEXPEN) (70-30) 100 UNIT/ML FlexPen Inject 0.22 mLs (22 Units total) into the skin 2 (two) times daily.   loratadine (CLARITIN) 10 MG tablet Take 1 tablet (10 mg total) by mouth daily.   metFORMIN (GLUCOPHAGE) 1000 MG tablet TAKE 1 TABLET (1,000 MG TOTAL) BY MOUTH 2 (TWO) TIMES DAILY WITH A MEAL.   Multiple Vitamins-Minerals (WOMENS MULTI PO) Take by mouth.   potassium chloride SA (K-DUR,KLOR-CON) 20 MEQ tablet Take 20 meq tablet when taking an extra lasix tablet daily as needed   Saccharomyces boulardii (PROBIOTIC) 250 MG CAPS Take by mouth.   sacubitril-valsartan (ENTRESTO) 49-51 MG Take 1 tablet by mouth 2 (two) times daily.   spironolactone (ALDACTONE) 25 MG tablet Take 1 tablet (25 mg total) by mouth daily.   valACYclovir HCl (VALTREX PO) Take by mouth.   vitamin B-12 (CYANOCOBALAMIN) 500 MCG tablet Take 500 mcg by mouth daily.   vitamin C (ASCORBIC ACID) 500 MG tablet Take 1,000 mg 2 (two) times daily by mouth.     Allergies:   Latex, Tylenol with codeine #3 [acetaminophen-codeine],  and Penicillins   Social History   Tobacco Use   Smoking status: Former Smoker    Packs/day: 0.25    Years: 23.00    Pack years: 5.75    Types: Cigarettes    Quit date: 10/09/2015    Years since quitting: 3.5   Smokeless tobacco: Never Used  Substance Use Topics   Alcohol use: No   Drug use: No     Family Hx: The patient's family history includes Breast cancer (age of onset: 13)  in an other family member; Colon cancer (age of onset: 73) in her mother; Fibromyalgia in her sister; Heart attack in her father; Pancreatic cancer in an other family member. There is no history of Esophageal cancer, Rectal cancer, or Stomach cancer.   Prior CV studies:   The following studies were reviewed today:  2D Echo October 2018: Mild concentric LVH.  EF 45 to 50% with diffuse hypokinesis.  GR 1 DD.  Mild aortic root dilation.  2D Echo August 26, 2017: Mild LVH EF 45 to 50%.  Diffuse HK.  No significant valve disease.  No significant change.  Labs/Other Tests and Data Reviewed:    EKG:  No ECG reviewed.  Recent Labs: 12/17/2018: TSH 3.920 04/02/2019: ALT 16; BUN 12; Creatinine, Ser 0.79; Hemoglobin 12.3; Platelets 314; Potassium 3.9; Sodium 141   Recent Lipid Panel Lab Results  Component Value Date/Time   CHOL 176 12/17/2018 09:00 AM   TRIG 220 (H) 12/17/2018 09:00 AM   HDL 29 (L) 12/17/2018 09:00 AM   CHOLHDL 6.1 (H) 12/17/2018 09:00 AM   CHOLHDL 4.2 03/20/2017 09:43 AM   LDLCALC 103 (H) 12/17/2018 09:00 AM    Wt Readings from Last 3 Encounters:  05/07/19 248 lb (112.5 kg)  04/12/19 240 lb (108.9 kg)  04/07/19 239 lb 6.4 oz (108.6 kg)     Objective:    Vital Signs:  BP (!) 142/91    Pulse 78    Ht '5\' 6"'  (1.676 m)    Wt 248 lb (112.5 kg)    BMI 40.03 kg/m   Usual BP 127/90 mmHg (ate a late meal last PM -  I.e > 7pm - this usually makes her BP & CBG go up) VITAL SIGNS:  reviewed Healthy sounding female in no acute distress.  A&O x 3.  Normal Mood & Affect Non-labored  respirations   ASSESSMENT & PLAN:    Problem List Items Addressed This Visit    Chronic combined systolic and diastolic heart failure ([place]) - Primary (Chronic)    Her EF has definitely improved on aggressive medication. She continues to however require more Lasix and I think that the diabetes effect is throwing off her weights.  She was down to the 230s which looked great but now she is up to 249. I think we should need to set a dry weight somewhere in the happy medium between those 2.  New target dry weight between 242 in 243 pounds -> this would mean that she needs increase her Lasix per instructions noted below      Essential hypertension (Chronic)    Still hypertensive, and with the need for increased diuretic, I plan is to increase Entresto.  She will take double dose of the current pills for the next 2 weeks.  After 1 week as long she is not noting hypotension, we will simply increase in the 97/103 mg dose.      Abnormal myocardial perfusion study (Chronic)    Notably improved EF by echo.  She is not having any angina symptoms.  For now we will continue to monitor.  Plan is eventually consider coronary CT angiogram.      Dilated cardiomyopathy (HCC) (Chronic)   Coronary artery calcification seen on CAT scan (Chronic)    No active angina, improved EF.  Continue current meds for now. Target LDL less than 100 but closer to 81  Were she to have worsening symptoms, would consider coronary CT angiogram      Mixed hyperlipidemia (Chronic)  LDL is 100 but last check.  Pretty close to where we want to be for now.  Continue atorvastatin and reevaluate in 6 months.      Obesity (BMI 30-39.9) (Chronic)    She gains weight and loses weight and is very difficult to understand whether this is related to diet or not.  Her diet seems to be relatively strange but does not seem to be eating any more than usual.  She was thrown off by diabetes and then being placed on insulin.  I  would like to see what her weight finally normalizes out as.  We are setting a new target dry weight for now at 243.         COVID-19 Education: The signs and symptoms of COVID-19 were discussed with the patient and how to seek care for testing (follow up with PCP or arrange E-visit).   The importance of social distancing was discussed today.  Time:   Today, I have spent 35 minutes with the patient with telehealth technology discussing the above problems.     Patient Instructions  Medication Instructions:   For the furosemide/Lasix, let us set your new dry weight to 242-243 pounds.  That means that you are still up a little bit from where we want you to be.  If you are currently taking twice a day Lasix 40 mg you with increased to 80 mg in the morning 40 mg in the afternoon for 3 days to see if it g+o to back down.  Take a couple days back to regular dose before doubling up the morning dose again. ->  Once you get to the dry weight we will see if we can stay on the stable dose of once daily Lasix with the additional dose for weight gain greater than 3 pounds. --Remember when taking the additional doses of Lasix to take the extra potassium tablet  With your Entresto -> the plan is to increase your dose to the next dosing interval.  For now but I want you to do is take 2 tablets twice a day and monitor your blood pressure.  Over the first week if your blood pressure is running stable and not below 100 mmHg (for the top number) let us know here at the office and what we will do is send a prescription in for the new dose of 97/103 mg and we will give you a 90-day supply.   If you need a refill on your cardiac medications before your next appointment, please call your pharmacy.   Lab work:  If your primary care doctor is checking blood work routinely because of diabetes, it is important that we make sure that she is also checking her kidney function.  If not we need a chemistry panel prior to  follow-up visit  If you have labs (blood work) drawn today and your tests are completely normal, you will receive your results only by:  [place] (if you have MyChart) OR  A paper copy in the mail If you have any lab test that is abnormal or we need to change your treatment, we will call you to review the results.  Testing/Procedures:   No new tests  Follow-Up: At [facility], you and your health needs are our priority.  As part of our continuing mission to provide you with exceptional heart care, we have created designated Provider Care Teams.  These Care Teams include your primary Cardiologist (physician) and Advanced Practice Providers (APPs -  Physician  Assistants and Nurse Practitioners) who all work together to provide you with the care you need, when you need it.  You will need a follow up appointment in    ~3  Months (you can decide whether you want to be virtual or in person).  Please call our office 2 months in advance to schedule this appointment.  You may see Glenetta Hew, MD or one of the following Advanced Practice Providers on your designated Care Team:    Rosaria Ferries, PA-C  Jory Sims, DNP, ANP  Any Other Special Instructions Will Be Listed Below (If Applicable).     Signed, Glenetta Hew, MD  05/07/2019 5:34 PM    [place]

## 2019-05-14 ENCOUNTER — Telehealth: Payer: Self-pay | Admitting: Family Medicine

## 2019-05-14 NOTE — Telephone Encounter (Signed)
Please help with this. Thanks.

## 2019-05-14 NOTE — Telephone Encounter (Signed)
Pt dropped off SCAT application. Please call the at [phone] when complete

## 2019-05-14 NOTE — Telephone Encounter (Signed)
Left message for pt to call me back.  I have questions to ask pt about forms

## 2019-05-17 NOTE — Telephone Encounter (Signed)
Questions have been answerd and faxed back

## 2019-05-21 ENCOUNTER — Encounter: Payer: Self-pay | Admitting: Internal Medicine

## 2019-05-26 ENCOUNTER — Other Ambulatory Visit: Payer: Self-pay

## 2019-05-26 MED ORDER — ENTRESTO 49-51 MG PO TABS: 1.0000 | ORAL_TABLET | Freq: Two times a day (BID) | ORAL | 6 refills | Status: DC

## 2019-06-03 ENCOUNTER — Other Ambulatory Visit: Payer: Medicare HMO

## 2019-06-03 ENCOUNTER — Ambulatory Visit
Admission: RE | Admit: 2019-06-03 | Discharge: 2019-06-03 | Disposition: A | Discharge status: Home / Self Care | Payer: Medicare HMO | Source: Ambulatory Visit | Attending: Nurse Practitioner | Admitting: Nurse Practitioner

## 2019-06-03 ENCOUNTER — Other Ambulatory Visit: Payer: Self-pay

## 2019-06-03 ENCOUNTER — Other Ambulatory Visit: Payer: Self-pay | Admitting: Nurse Practitioner

## 2019-06-03 DIAGNOSIS — Z171 Estrogen receptor negative status [ER-]: Secondary | ICD-10-CM

## 2019-06-03 DIAGNOSIS — C50512 Malignant neoplasm of lower-outer quadrant of left female breast: Secondary | ICD-10-CM

## 2019-06-03 DIAGNOSIS — N6489 Other specified disorders of breast: Secondary | ICD-10-CM | POA: Diagnosis not present

## 2019-06-03 DIAGNOSIS — R922 Inconclusive mammogram: Secondary | ICD-10-CM | POA: Diagnosis not present

## 2019-06-03 DIAGNOSIS — N6011 Diffuse cystic mastopathy of right breast: Secondary | ICD-10-CM | POA: Diagnosis not present

## 2019-06-09 ENCOUNTER — Encounter: Payer: Self-pay | Admitting: Family Medicine

## 2019-06-09 ENCOUNTER — Ambulatory Visit (INDEPENDENT_AMBULATORY_CARE_PROVIDER_SITE_OTHER): Payer: Medicare HMO | Admitting: Family Medicine

## 2019-06-09 ENCOUNTER — Other Ambulatory Visit: Payer: Self-pay

## 2019-06-09 VITALS — BP 118/70 | HR 81 | Temp 97.5°F | Wt 245.0 lb

## 2019-06-09 DIAGNOSIS — G8929 Other chronic pain: Secondary | ICD-10-CM | POA: Diagnosis not present

## 2019-06-09 DIAGNOSIS — M25551 Pain in right hip: Secondary | ICD-10-CM

## 2019-06-09 MED ORDER — DICLOFENAC SODIUM 75 MG PO TBEC: 75.0000 mg | DELAYED_RELEASE_TABLET | Freq: Two times a day (BID) | ORAL | 0 refills | Status: DC

## 2019-06-09 NOTE — Progress Notes (Signed)
   Subjective:    Patient ID: Kristin Ward, female    DOB: [birth year], 63 y.o.   MRN: [id number]  HPI Chief Complaint  Patient presents with  . hip pain    hip pain x1 month. walking more so it has flared up. woke up and couldn't walk the other day broke right hip as a child    Here with complaints of intermittent severe right hip pain x 2-3  months. Pain started soon after walking up 126 steps for exercise. No known injury- recently. She does report a hip fracture as a child. Pain is mainly anterior and non radiating. No locking, popping. No numbness, tingling or weakness. Pain is aggravated by walking an improved with rest.   Taking 1,000 mg of Tylenol twice daily. Using a heating pad.   Denies fever, chills, dizziness, chest pain, palpitations, shortness of breath, abdominal pain, N/V/D, urinary symptoms, LE edema.   Reviewed allergies, medications, past medical, surgical, family, and social history.    Review of Systems Pertinent positives and negatives in the history of present illness.     Objective:   Physical Exam Constitutional:      General: She is not in acute distress.    Appearance: She is not ill-appearing.  Neck:     Musculoskeletal: Normal range of motion and neck supple.  Cardiovascular:     Rate and Rhythm: Normal rate and regular rhythm.     Pulses: Normal pulses.  Pulmonary:     Effort: Pulmonary effort is normal.     Breath sounds: Normal breath sounds.  Musculoskeletal:     Right hip: She exhibits decreased range of motion, decreased strength and tenderness.     Left hip: Normal.     Lumbar back: Normal.     Comments: Right hip- normal sensation. Limited ROM due to pain. Severe pain with flexion, extension and with internal rotation. RLE is neurovascularly intact.   Skin:    General: Skin is warm and dry.     Capillary Refill: Capillary refill takes less than 2 seconds.     Findings: No rash.  Neurological:     Mental Status: She is alert  and oriented to person, place, and time.     Cranial Nerves: Cranial nerves are intact.     Sensory: Sensation is intact.     Gait: Gait abnormal.    BP 118/70   Pulse 81   Temp (!) 97.5 F (36.4 C)   Wt 245 lb (111.1 kg)   BMI 39.54 kg/m       Assessment & Plan:  Chronic right hip pain - Plan: diclofenac (VOLTAREN) 75 MG EC tablet, DG Hip Unilat W OR W/O Pelvis Min 4 Views Right, Ambulatory referral to Orthopedic Surgery  No red flag symptoms. No obvious acute infectious process.  Will send her for right hip XR and start her on oral diclofenac.  Refer to orthopedic surgery.

## 2019-06-10 ENCOUNTER — Other Ambulatory Visit: Payer: Self-pay | Admitting: Internal Medicine

## 2019-06-10 ENCOUNTER — Ambulatory Visit
Admission: RE | Admit: 2019-06-10 | Discharge: 2019-06-10 | Disposition: A | Discharge status: Home / Self Care | Payer: Medicare HMO | Source: Ambulatory Visit | Attending: Family Medicine | Admitting: Family Medicine

## 2019-06-10 ENCOUNTER — Other Ambulatory Visit: Payer: Self-pay | Admitting: Family Medicine

## 2019-06-10 DIAGNOSIS — E119 Type 2 diabetes mellitus without complications: Secondary | ICD-10-CM

## 2019-06-10 DIAGNOSIS — M25551 Pain in right hip: Secondary | ICD-10-CM

## 2019-06-10 DIAGNOSIS — G8929 Other chronic pain: Secondary | ICD-10-CM

## 2019-06-10 DIAGNOSIS — R102 Pelvic and perineal pain: Secondary | ICD-10-CM

## 2019-06-10 DIAGNOSIS — M1611 Unilateral primary osteoarthritis, right hip: Secondary | ICD-10-CM | POA: Diagnosis not present

## 2019-06-10 MED ORDER — ACCU-CHEK FASTCLIX LANCETS MISC: 2 refills | Status: DC

## 2019-06-10 MED ORDER — DICLOFENAC SODIUM 75 MG PO TBEC: 75.0000 mg | DELAYED_RELEASE_TABLET | Freq: Two times a day (BID) | ORAL | 0 refills | Status: DC

## 2019-06-11 ENCOUNTER — Telehealth: Payer: Self-pay

## 2019-06-11 MED ORDER — GLUCOSE BLOOD VI STRP: ORAL_STRIP | 2 refills | Status: DC

## 2019-06-11 NOTE — Telephone Encounter (Signed)
Sent in test strips 

## 2019-06-11 NOTE — Telephone Encounter (Signed)
Received fax from [facility]. For a prescription for for test strips for her Accu-Check Guide Meter. Last apt 06/09/19.

## 2019-06-15 ENCOUNTER — Telehealth: Payer: Self-pay | Admitting: Cardiology

## 2019-06-15 MED ORDER — SACUBITRIL-VALSARTAN 97-103 MG PO TABS: 1.0000 | ORAL_TABLET | Freq: Two times a day (BID) | ORAL | 3 refills | Status: DC

## 2019-06-15 NOTE — Telephone Encounter (Signed)
Pt aware new script sent to [facility] as requested Entresto 97/103 mg 1 tab twice daily ./cy

## 2019-06-15 NOTE — Telephone Encounter (Signed)
Patient stated that at her last visit she was told to increase her sacubitril-valsartan (ENTRESTO) 49-51 MG.  She would like a new script for the new dose, so she can have for a 90 day supply. [id number] [place], [place]

## 2019-06-16 ENCOUNTER — Other Ambulatory Visit: Payer: Self-pay | Admitting: Internal Medicine

## 2019-06-16 ENCOUNTER — Ambulatory Visit
Admission: RE | Admit: 2019-06-16 | Discharge: 2019-06-16 | Disposition: A | Discharge status: Home / Self Care | Payer: Medicare HMO | Source: Ambulatory Visit | Attending: Family Medicine | Admitting: Family Medicine

## 2019-06-16 DIAGNOSIS — N858 Other specified noninflammatory disorders of uterus: Secondary | ICD-10-CM | POA: Diagnosis not present

## 2019-06-16 DIAGNOSIS — R102 Pelvic and perineal pain: Secondary | ICD-10-CM

## 2019-06-16 DIAGNOSIS — R9389 Abnormal findings on diagnostic imaging of other specified body structures: Secondary | ICD-10-CM

## 2019-06-17 ENCOUNTER — Ambulatory Visit: Payer: Medicare HMO | Admitting: Orthopaedic Surgery

## 2019-06-18 ENCOUNTER — Ambulatory Visit (INDEPENDENT_AMBULATORY_CARE_PROVIDER_SITE_OTHER): Payer: Medicare HMO | Admitting: Internal Medicine

## 2019-06-18 ENCOUNTER — Other Ambulatory Visit: Payer: Self-pay

## 2019-06-18 ENCOUNTER — Encounter: Payer: Self-pay | Admitting: Internal Medicine

## 2019-06-18 VITALS — BP 128/72 | HR 75 | Resp 16 | Ht 66.0 in | Wt 247.0 lb

## 2019-06-18 DIAGNOSIS — E119 Type 2 diabetes mellitus without complications: Secondary | ICD-10-CM | POA: Diagnosis not present

## 2019-06-18 LAB — POCT GLYCOSYLATED HEMOGLOBIN (HGB A1C): Hemoglobin A1C: 6.7 % — AB (ref 4.0–5.6)

## 2019-06-18 NOTE — Progress Notes (Signed)
Name: Kristin Ward  Age/ Sex: 63 y.o., female   MRN/ DOB: [id number], 08-07-1956     PCP: Girtha Rm, NP-C   Reason for Endocrinology Evaluation: Type 2 Diabetes Mellitus  Initial Endocrine Consultative Visit: 01/27/19    PATIENT IDENTIFIER: Kristin Ward is a 63 y.o. female with a past medical history of Retinitis Pigmentosa, T2DM, HTN,macular degeneration and CHF. The patient has followed with Endocrinology clinic since 01/27/19 for consultative assistance with management of her diabetes.  DIABETIC HISTORY:  Kristin Ward was diagnosed with T2 DM in 12/2018, she presented with symptomatic hyperglycemia. She was started on Metformin, Basaglar and Tradjenta. Her hemoglobin A1c was 13.0% on initial diagnosis.   Novolog Mix started 01/2019  SUBJECTIVE:   During the last visit (03/10/2019): Decreased Novolog mix and continued metformin   Today (06/18/2019): Kristin Ward is here for a 3 month  follow up on diabetes.  She checks her blood sugars 2 times daily, preprandial to breakfast and evening . The patient has not had hypoglycemic episodes since the last clinic visit.Otherwise, the patient has not required any recent emergency interventions for hypoglycemia and has not had recent hospitalizations secondary to hyper or hypoglycemic episodes.   Stopped insulin 02/2019 due to hypoglycemia.   ROS: As per HPI and as detailed below: Review of Systems  Constitutional: Negative for fever and weight loss.  HENT: Negative for congestion and sore throat.   Respiratory: Negative for cough and shortness of breath.   Cardiovascular: Negative for chest pain and palpitations.  Gastrointestinal: Negative for diarrhea and nausea.      HOME DIABETES REGIMEN:  Metformin 1000 mg BID Novolog Mix 24 units BID with meals - Does not take it    METER DOWNLOAD SUMMARY: 10/8-11/01/2019 Fingerstick Blood Glucose Tests = 19 Average Number Tests/Day = 0.6 Overall Mean FS Glucose = 138    BG Ranges: Low = 106 High = 178  Hypoglycemic Events/30 Days: BG < 50 = 0 Episodes of symptomatic severe hypoglycemia = 0      HISTORY:  Past Medical History:  Past Medical History:  Diagnosis Date  . Abnormal x-ray of lungs with single pulmonary nodule 03/15/2016   per records from [place]   . Abnormal x-ray of lungs with single pulmonary nodule 03/15/2016   per records from [place]   . Anemia   . Asthma   . CHF (congestive heart failure) ([place])   . Cholelithiasis 05/19/2017   On CT  . Coronary artery calcification seen on CAT scan 05/19/2017  . Diabetes mellitus without complication ([place])    type 2  . Dilated idiopathic cardiomyopathy ([place]) 12/2015   EF 15-20%. Diagnosed in [facility]  . Headache    NONE RECENT  . Hypertension   . Malignant neoplasm of lower-outer quadrant of left breast of female, estrogen receptor negative ([place]) 06/11/2017   left breast  . Mixed hyperlipidemia 06/17/2018  . partially blind   . Personal history of chemotherapy 2018-2019  . Personal history of radiation therapy 2019  . Retinitis pigmentosa   . Solitary pulmonary nodule on lung CT 03/16/2016   per records from [place]  . Uveitis    Past Surgical History:  Past Surgical History:  Procedure Laterality Date  . brain cyst removed  2007   to help with headaches per patient , aspirated   . BREAST BIOPSY Left 06/06/2017   x2  . BREAST CYST ASPIRATION Left 06/06/2017  . BREAST LUMPECTOMY Left 06/26/2017  . BREAST LUMPECTOMY  WITH RADIOACTIVE SEED AND SENTINEL LYMPH NODE BIOPSY Left 06/26/2017   Procedure: BREAST LUMPECTOMY WITH RADIOACTIVE SEED AND SENTINEL LYMPH NODE BIOPSY ERAS  PATHWAY;  Surgeon: [name], MD;  Location: [place];  Service: General;  Laterality: Left;  pec block  . FINE NEEDLE ASPIRATION Left 02/23/2019   Procedure: FINE NEEDLE ASPIRATION;  Surgeon: [name], MD;  Location: [place];  Service: General;  Laterality: Left;   Asiration of left breast seroma   . NASAL TURBINATE REDUCTION    . PORT-A-CATH REMOVAL Right 02/23/2019   Procedure: REMOVAL PORT-A-CATH;  Surgeon: [name], MD;  Location: [place];  Service: General;  Laterality: Right;  . PORTACATH PLACEMENT N/A 06/26/2017   Procedure: INSERTION PORT-A-CATH;  Surgeon: [name], MD;  Location: [place];  Service: General;  Laterality: N/A;  . TRANSTHORACIC ECHOCARDIOGRAM  12/2015   A) [facility] May 2017: Mild concentric LVH. Global hypokinesis. GR daily. EF 18% severe LA dilation. Mitral annular dilatation with papillary muscle dysfunction and moderate MR. Dilated IVC consistent with elevated RAP.  Marland Kitchen TRANSTHORACIC ECHOCARDIOGRAM  05/2017; 08/2017   a) Mild concentric LVH.  EF 45 to 50% with diffuse hypokinesis.  GR 1 DD.  Mild aortic root dilation.;; b)  Mild LVH EF 45 to 50%.  Diffuse HK.  No significant valve disease.  No significant change.    Social History:  reports that she quit smoking about 3 years ago. Her smoking use included cigarettes. She has a 5.75 pack-year smoking history. She has never used smokeless tobacco. She reports that she does not drink alcohol or use drugs. Family History:  Family History  Problem Relation Age of Onset  . Colon cancer Mother 3       deceased 106  . Heart attack Father   . Breast cancer Other 25       2nd cousin on maternal side; currently 17  . Pancreatic cancer Other        2nd cousin once removed on maternal side; deceased 82s  . Fibromyalgia Sister   . Esophageal cancer Neg Hx   . Rectal cancer Neg Hx   . Stomach cancer Neg Hx      HOME MEDICATIONS: Allergies as of 06/18/2019      Reactions   Latex Hives, Itching   Burning    Tylenol With Codeine #3 [acetaminophen-codeine] Nausea And Vomiting   Penicillins Nausea Only, Swelling, Rash   Has patient had a PCN reaction causing immediate rash, facial/tongue/throat swelling, SOB or lightheadedness with hypotension: Yes  Has patient had a PCN reaction causing severe rash involving mucus membranes or skin necrosis: Yes Has patient had a PCN reaction that required hospitalization: No Has patient had a PCN reaction occurring within the last 10 years: No TONGUE SWELLING AND RASH AROUND MOUTH If all of the above answers are "NO", then may proceed with Cephalosporin use.      Medication List       Accurate as of June 18, 2019  9:38 AM. If you have any questions, ask your nurse or doctor.        Accu-Chek FastClix Lancets Misc Test twice a day. Pt uses an accu-chek guide me meter   albuterol 108 (90 Base) MCG/ACT inhaler Commonly known as: VENTOLIN HFA Inhale 2 puffs into the lungs every 6 (six) hours as needed for wheezing or shortness of breath.   aspirin 81 MG chewable tablet Chew 81 mg by mouth daily.   atorvastatin 20 MG tablet  Commonly known as: LIPITOR Take 1 tablet (20 mg total) by mouth daily.   carvedilol 12.5 MG tablet Commonly known as: COREG Take 1 tablet (12.5 mg total) by mouth 2 (two) times daily with a meal.   D3-1000 PO Take by mouth.   diclofenac 75 MG EC tablet Commonly known as: VOLTAREN Take 1 tablet (75 mg total) by mouth 2 (two) times daily.   furosemide 40 MG tablet Commonly known as: LASIX TAKE 1 TAB BY MOUTH DAILY. MAY TAKE ADDITIONAL 40 MG TABLET IF WEIGHT GAIN OF 3LBS OR MORE OVERNIGHT   gabapentin 300 MG capsule Commonly known as: NEURONTIN Take 1 capsule (300 mg total) by mouth 3 (three) times daily.   glucose blood test strip 2x daily. Pt uses accu-chek meter   loratadine 10 MG tablet Commonly known as: CLARITIN Take 1 tablet (10 mg total) by mouth daily.   metFORMIN 1000 MG tablet Commonly known as: GLUCOPHAGE TAKE 1 TABLET (1,000 MG TOTAL) BY MOUTH 2 (TWO) TIMES DAILY WITH A MEAL.   NovoLOG Mix 70/30 FlexPen (70-30) 100 UNIT/ML FlexPen Generic drug: insulin aspart protamine - aspart Inject 0.22 mLs (22 Units total) into the skin 2 (two) times  daily.   potassium chloride SA 20 MEQ tablet Commonly known as: KLOR-CON Take 20 meq tablet when taking an extra lasix tablet daily as needed   Probiotic 250 MG Caps Take by mouth.   Prodigy Voice Blood Glucose w/Device Kit 1 Device by Does not apply route as directed.   sacubitril-valsartan 97-103 MG Commonly known as: ENTRESTO Take 1 tablet by mouth 2 (two) times daily.   spironolactone 25 MG tablet Commonly known as: ALDACTONE Take 1 tablet (25 mg total) by mouth daily.   VALTREX PO Take by mouth.   vitamin B-12 500 MCG tablet Commonly known as: CYANOCOBALAMIN Take 500 mcg by mouth daily.   vitamin C 500 MG tablet Commonly known as: ASCORBIC ACID Take 1,000 mg 2 (two) times daily by mouth.   WOMENS MULTI PO Take by mouth.        OBJECTIVE:   Vital Signs: BP 128/72   Pulse 75   Resp 16   Ht '5\' 6"'  (1.676 m)   Wt 247 lb (112 kg)   SpO2 98%   BMI 39.87 kg/m   Wt Readings from Last 3 Encounters:  06/18/19 247 lb (112 kg)  06/09/19 245 lb (111.1 kg)  05/07/19 248 lb (112.5 kg)     Exam: General: Pt appears well and is in NAD  Lungs: Clear with good BS bilat with no rales, rhonchi, or wheezes  Heart: RRR with normal S1 and S2 and no gallops; no murmurs; no rub  Abdomen: Normoactive bowel sounds, soft, nontender, without masses or organomegaly palpable  Extremities: No pretibial edema. No tremor.   Skin: Normal texture and temperature to palpation. No rash noted. No Acanthosis nigricans/skin tags. No lipohypertrophy.  Neuro: MS is good with appropriate affect, pt is alert and Ox3         DM foot exam: 01/27/2019  The skin of the feet is intact without sores or ulcerations. The pedal pulses are 2+ on right and 2+ on left. The sensation is intact to a screening 5.07, 10 gram monofilament bilaterally   DATA REVIEWED:  Lab Results  Component Value Date   HGBA1C 6.7 (A) 06/18/2019   HGBA1C 13.0 (A) 12/17/2018   Lab Results  Component Value Date    LDLCALC 103 (H) 12/17/2018   CREATININE 0.79 04/02/2019  Lab Results  Component Value Date   CHOL 176 12/17/2018   HDL 29 (L) 12/17/2018   LDLCALC 103 (H) 12/17/2018   TRIG 220 (H) 12/17/2018   CHOLHDL 6.1 (H) 12/17/2018         ASSESSMENT / PLAN / RECOMMENDATIONS:   1) Type 2 Diabetes Mellitus, Optimally Controlled, Without complications - Most recent A1c of 6.7 %. Goal A1c < 7.0 %.    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  - Pt has not been on insulin since July, 2020 as she had tight BG's following drainage of breast seroma and removal of porta-cath.  - She has done well with lifestyle changes, I have congratulated her on optimal glucose control with metformin and lifestyle changes, she was encouraged to continue with this.     MEDICATIONS:   Continue Metformin 1000 mg BID   EDUCATION / INSTRUCTIONS:  BG monitoring instructions: Patient is instructed to check her blood sugars 2 times a day, fasting and supper.  Call [facility] if: BG persistently < 70 or > 300. . I reviewed the Rule of 15 for the treatment of hypoglycemia in detail with the patient. Literature supplied.    F/U in 3 month   Signed electronically by: Mack Guise, MD  [facility]  [facility] [facility] [address] [place], [place] [address] Phone: [phone] FAX: [phone]   CC: Girtha Rm, NP-C [place] [address] Phone: [phone]  Fax: [phone]  Return to Endocrinology clinic as below: Future Appointments  Date Time Provider [facility]  07/01/2019 10:00 AM Leota Sauers, RD [place] NDM  07/30/2019  8:20 AM [name], MD [facility]  08/02/2019 10:30 AM [facility] LAB 2  [facility] None  08/02/2019 11:20 AM [name], MD [facility] None  08/31/2019 11:00 AM [facility] DX DEXA 1 [facility] GI-BREAST [facility]

## 2019-06-18 NOTE — Patient Instructions (Addendum)
-   Continue Metformin 1000 mg Twice a day with meals - Keep up the good work          HOW TO TREAT LOW BLOOD SUGARS (Blood sugar LESS THAN 70 MG/DL)  Please follow the RULE OF 15 for the treatment of hypoglycemia treatment (when your (blood sugars are less than 70 mg/dL)    STEP 1: Take 15 grams of carbohydrates when your blood sugar is low, which includes:   3-4 GLUCOSE TABS  OR  3-4 OZ OF JUICE OR REGULAR SODA OR  ONE TUBE OF GLUCOSE GEL     STEP 2: RECHECK blood sugar in 15 MINUTES STEP 3: If your blood sugar is still low at the 15 minute recheck --> then, go back to STEP 1 and treat AGAIN with another 15 grams of carbohydrates.

## 2019-06-20 ENCOUNTER — Encounter: Payer: Self-pay | Admitting: Internal Medicine

## 2019-06-20 MED ORDER — METFORMIN HCL 1000 MG PO TABS: 1000.0000 mg | ORAL_TABLET | Freq: Two times a day (BID) | ORAL | 3 refills | Status: DC

## 2019-07-01 ENCOUNTER — Ambulatory Visit: Payer: Medicare HMO | Admitting: Registered"

## 2019-07-06 ENCOUNTER — Other Ambulatory Visit: Payer: Medicare HMO

## 2019-07-19 ENCOUNTER — Telehealth: Payer: Self-pay | Admitting: Hematology

## 2019-07-19 NOTE — Telephone Encounter (Signed)
YF PAL moved 12/21 visit to 12/29. Left message. Schedule mailed.

## 2019-07-29 ENCOUNTER — Telehealth: Payer: Self-pay | Admitting: *Deleted

## 2019-07-29 NOTE — Telephone Encounter (Signed)
Virtual Visit Pre-Appointment Phone Call  "(Name), I am calling you today to discuss your upcoming appointment. We are currently trying to limit exposure to the virus that causes COVID-19 by seeing patients at home rather than in the office."  1. "What is the BEST phone number to call the day of the visit?" - include this in appointment notes  2. "Do you have or have access to (through a family member/friend) a smartphone with video capability that we can use for your visit?" a. If yes - list this number in appt notes as "cell" (if different from BEST phone #) and list the appointment type as a VIDEO visit in appointment notes b. If no - list the appointment type as a PHONE visit in appointment notes  3. Confirm consent - "In the setting of the current Covid19 crisis, you are scheduled for a (video) visit with your provider on (Friday, December 17) at (8:20 am).  Just as we do with many in-office visits, in order for you to participate in this visit, we must obtain consent.  If you'd like, I can send this to your mychart (if signed up) or email for you to review.  Otherwise, I can obtain your verbal consent now.  All virtual visits are billed to your insurance company just like a normal visit would be.  By agreeing to a virtual visit, we'd like you to understand that the technology does not allow for your provider to perform an examination, and thus may limit your provider's ability to fully assess your condition. If your provider identifies any concerns that need to be evaluated in person, we will make arrangements to do so.  Finally, though the technology is pretty good, we cannot assure that it will always work on either your or our end, and in the setting of a video visit, we may have to convert it to a phone-only visit.  In either situation, we cannot ensure that we have a secure connection.  Are you willing to proceed?" STAFF: Did the patient verbally acknowledge consent to telehealth visit?  Document YES/NO here: YES  4. Advise patient to be prepared - "Two hours prior to your appointment, go ahead and check your blood pressure, pulse, oxygen saturation, and your weight (if you have the equipment to check those) and write them all down. When your visit starts, your provider will ask you for this information. If you have an Apple Watch or Kardia device, please plan to have heart rate information ready on the day of your appointment. Please have a pen and paper handy nearby the day of the visit as well."  5. Give patient instructions for MyChart download to smartphone OR Doximity/[email or web address].me as below if video visit (depending on what platform provider is using)  6. Inform patient they will receive a phone call 15 minutes prior to their appointment time (may be from unknown caller ID) so they should be prepared to answer    TELEPHONE CALL NOTE  Kristin Ward has been deemed a candidate for a follow-up tele-health visit to limit community exposure during the Covid-19 pandemic. I spoke with the patient via phone to ensure availability of phone/video source, confirm preferred email & phone number, and discuss instructions and expectations.  I reminded Kristin Ward to be prepared with any vital sign and/or heart rhythm information that could potentially be obtained via home monitoring, at the time of her visit. I reminded Kristin Ward to expect a phone call prior  to her visit.  Kristin Ward 07/29/2019 10:44 AM   INSTRUCTIONS FOR DOWNLOADING THE MYCHART APP TO SMARTPHONE  - The patient must first make sure to have activated MyChart and know their login information - If Apple, go to [employer] and type in MyChart in the search bar and download the app. If Android, ask patient to go to [employer] and type in [place] in the search bar and download the app. The app is free but as with any other app downloads, their phone may require them to verify saved payment  information or Apple/Android password.  - The patient will need to then log into the app with their MyChart username and password, and select [facility] as their healthcare provider to link the account. When it is time for your visit, go to the MyChart app, find appointments, and click Begin Video Visit. Be sure to Select Allow for your device to access the Microphone and Camera for your visit. You will then be connected, and your provider will be with you shortly.  **If they have any issues connecting, or need assistance please contact MyChart service desk [phone])**  **If using a computer, in order to ensure the best quality for their visit they will need to use either of the following Internet Browsers: [employer], or Google Chrome**  IF USING DOXIMITY or DOXY.ME - The patient will receive a link just prior to their visit by text.     FULL LENGTH CONSENT FOR TELE-HEALTH VISIT   I hereby voluntarily request, consent and authorize [facility] and its employed or contracted physicians, physician assistants, nurse practitioners or other licensed health care professionals (the Practitioner), to provide me with telemedicine health care services (the "Services") as deemed necessary by the treating Practitioner. I acknowledge and consent to receive the Services by the Practitioner via telemedicine. I understand that the telemedicine visit will involve communicating with the Practitioner through live audiovisual communication technology and the disclosure of certain medical information by electronic transmission. I acknowledge that I have been given the opportunity to request an in-person assessment or other available alternative prior to the telemedicine visit and am voluntarily participating in the telemedicine visit.  I understand that I have the right to withhold or withdraw my consent to the use of telemedicine in the course of my care at any time, without affecting my right  to future care or treatment, and that the Practitioner or I may terminate the telemedicine visit at any time. I understand that I have the right to inspect all information obtained and/or recorded in the course of the telemedicine visit and may receive copies of available information for a reasonable fee.  I understand that some of the potential risks of receiving the Services via telemedicine include:  Marland Kitchen Delay or interruption in medical evaluation due to technological equipment failure or disruption; . Information transmitted may not be sufficient (e.g. poor resolution of images) to allow for appropriate medical decision making by the Practitioner; and/or  . In rare instances, security protocols could fail, causing a breach of personal health information.  Furthermore, I acknowledge that it is my responsibility to provide information about my medical history, conditions and care that is complete and accurate to the best of my ability. I acknowledge that Practitioner's advice, recommendations, and/or decision may be based on factors not within their control, such as incomplete or inaccurate data provided by me or distortions of diagnostic images or specimens that may result from electronic transmissions.  I understand that the practice of medicine is not an exact science and that Practitioner makes no warranties or guarantees regarding treatment outcomes. I acknowledge that I will receive a copy of this consent concurrently upon execution via email to the email address I last provided but may also request a printed copy by calling the office of [facility].    I understand that my insurance will be billed for this visit.   I have read or had this consent read to me. . I understand the contents of this consent, which adequately explains the benefits and risks of the Services being provided via telemedicine.  . I have been provided ample opportunity to ask questions regarding this consent and the Services  and have had my questions answered to my satisfaction. . I give my informed consent for the services to be provided through the use of telemedicine in my medical care  By participating in this telemedicine visit I agree to the above.

## 2019-07-30 ENCOUNTER — Telehealth (INDEPENDENT_AMBULATORY_CARE_PROVIDER_SITE_OTHER): Payer: Medicare HMO | Admitting: Cardiology

## 2019-07-30 ENCOUNTER — Other Ambulatory Visit: Payer: Self-pay | Admitting: *Deleted

## 2019-07-30 ENCOUNTER — Encounter: Payer: Self-pay | Admitting: Cardiology

## 2019-07-30 VITALS — Ht 67.0 in | Wt 249.0 lb

## 2019-07-30 DIAGNOSIS — I110 Hypertensive heart disease with heart failure: Secondary | ICD-10-CM

## 2019-07-30 DIAGNOSIS — I2510 Atherosclerotic heart disease of native coronary artery without angina pectoris: Secondary | ICD-10-CM | POA: Diagnosis not present

## 2019-07-30 DIAGNOSIS — E782 Mixed hyperlipidemia: Secondary | ICD-10-CM

## 2019-07-30 DIAGNOSIS — E669 Obesity, unspecified: Secondary | ICD-10-CM

## 2019-07-30 DIAGNOSIS — I420 Dilated cardiomyopathy: Secondary | ICD-10-CM | POA: Diagnosis not present

## 2019-07-30 DIAGNOSIS — I5042 Chronic combined systolic (congestive) and diastolic (congestive) heart failure: Secondary | ICD-10-CM | POA: Diagnosis not present

## 2019-07-30 DIAGNOSIS — I10 Essential (primary) hypertension: Secondary | ICD-10-CM

## 2019-07-30 MED ORDER — FUROSEMIDE 40 MG PO TABS: ORAL_TABLET | ORAL | 3 refills | Status: DC

## 2019-07-30 NOTE — Assessment & Plan Note (Signed)
Really hard to tell what her weight change is about.  It does seem that it is volume related and it may just be fluid buildup in the adiposity as she does not really notice edema.  We discussed her diet, and she seems to be doing okay without any salt load.  For now let us continue with titrating Lasix dosing to maintain stable weight.  The next goal will be for her actually to try to lose weight with reducing p.o. intake and increasing her level activity.

## 2019-07-30 NOTE — Assessment & Plan Note (Signed)
Thankfully, on aggressive medication, her EF has improved and stayed stable.  She asked about how long she needs to be on these medications, and I explained to her that pretty much I am fearful if we back off on the medicines that her EF will worsen.  With exception of some changes in weight, class I and at worst class II symptoms. Is on carvedilol, spironolactone and Entresto.  We are adjusting her Lasix in order to try to maintain her dry weight at home roughly 240-242  pounds.

## 2019-07-30 NOTE — Patient Instructions (Addendum)
Medication Instructions:   Kristin Ward try doing 6 days a week 80 mg Lasix in the morning 40 mg in the evening and on Sundays to 40 mg twice a day.  You are having your labs checked by your cancer doctor on the 29th and we can assess those labs to see where he stands.   I would like to eventually get back to doing 4 days of 80 mg - 40 mg and 3 days of 40 mg twice a day --as long as you maintain relatively stable weights.   *If you need a refill on your cardiac medications before your next appointment, please call your pharmacy*  Lab Work:  Pls ask your cancer doctor to forward your labs from 08/10/2019 to Dr. Ellyn Hack  If you have labs (blood work) drawn today and your tests are completely normal, you will receive your results only by: Marland Kitchen MyChart Message (if you have MyChart) OR . A paper copy in the mail If you have any lab test that is abnormal or we need to change your treatment, we will call you to review the results.  Testing/Procedures:  n/a  Follow-Up: At [facility], you and your health needs are our priority.  As part of our continuing mission to provide you with exceptional heart care, we have created designated Provider Care Teams.  These Care Teams include your primary Cardiologist (physician) and Advanced Practice Providers (APPs -  Physician Assistants and Nurse Practitioners) who all work together to provide you with the care you need, when you need it.  Your next appointment:   6 month(s)  The format for your next appointment:   In Person  Provider:   Glenetta Hew, MD  Other Instructions If your weight remains stable, try to go back to 4 days/week doing 80mg  AM & 40 mg PM Lasix

## 2019-07-30 NOTE — Progress Notes (Signed)
Virtual Visit via Video Note   This visit type was conducted due to national recommendations for restrictions regarding the COVID-19 Pandemic (e.g. social distancing) in an effort to limit this patient's exposure and mitigate transmission in our community.  Due to her co-morbid illnesses, this patient is at least at moderate risk for complications without adequate follow up.  This format is felt to be most appropriate for this patient at this time.  All issues noted in this document were discussed and addressed.  A limited physical exam was performed with this format.  Please refer to the patient's chart for her consent to telehealth for [facility].   Patient has given verbal permission to conduct this visit via virtual appointment and to bill insurance 07/30/2019 8:21 AM     Evaluation Performed:  Follow-up visit  Date:  07/30/2019   ID:  Kristin, Ward 1955/10/02, MRN [id number]  Patient Location: Home Provider Location: Office  PCP:  Girtha Rm, NP-C  Cardiologist:  Glenetta Hew, MD  Electrophysiologist:  None   Chief Complaint: 77-monthfollow-up -cardiomyopathy, hypertension Chief Complaint  Patient presents with  . Follow-up    Noting weight swings  . Cardiomyopathy    History of Present Illness:    PMeeya Goldinis a 63y.o. female with a moderately reduced EF (thought to be related to nonischemic potentially chemotherapy related etiology) along with Dx of breast cancer (treated in 2018 and 2019 with Cytoxan and docetaxel with adjuvant XRT) who presents today for 3-4 months follow-up via telemedicine.  I last saw her in January 2020.  She had notably improved symptoms of dyspnea, but had run out of her medications.  She had gained about 7 pounds and her weight was up to 247 pounds. -->  I increased her Lasix dose to 80 mg a day for 3 days and then alternating 40 mg and 8 mg until her weight went down.  Kristin Ward was last via telemedicine  by me via telemedicine on Sept 25, 2020 -> she noted that she had just been diagnosed with diabetes - no longer taking Novolog.She had been walking every day.  Unable to go to gym visit COVID-19.   -- Increased Entresto to full dose -- continued Carvedilol -- changed Lasix to 40 mg twice daily with the option to increase morning dose to 80 mg for 3 days to try to get weight down.  No New  and no recent hospitalizations beside having the port removed.  ONLY ON METFORMIN FOR DM. (She stopped taking it in Aug - after her surgery). . Recent CV studies:   The following studies were reviewed today:  none  Interval History:  [facility]  She walks most days (outside) 1-1.5 hr daily--oftentimes going out with a group of "girls "to walk in the park or the neighborhood..  Maintaining weight ~240-242 lb with intermittent increased dose of Lasix..  She notes that her wgt goes up by 4lb or so when she uses the 40 mg BID vs. 80 mg AM 40 mg PM Lasix.  Basically, as long as she takes the 80 mg morning dose of Lasix and her weight stays stable.  She really is not noticing edema just increased weight.  She is not sure why she still weighs the same amount because of as much exercise as she does.   Sleeps on extra pillows for GERD - not orthopnea.  And no PND.  She  notes exertional dyspnea if she overdoes it.  Remainder of cardiovascular review of symptoms: positive for - just increase in wgt with lower dosing of Lasix.  negative for - edema, irregular heartbeat, orthopnea, palpitations, paroxysmal nocturnal dyspnea, rapid heart rate, shortness of breath or Syncope/near syncope, TIA/amaurosis fugax  The patient DOES NOT have symptoms concerning for COVID-19 infection (fever, chills, cough, or new shortness of breath).  The patient is practicing social distancing.  ROS:  Please see the history of present illness.    Review of Systems  Constitutional: Negative for  malaise/fatigue and weight loss (She initially had weight loss and then she gained weight back again very difficult for her to understand.).  HENT: Negative for congestion and nosebleeds.   Respiratory: Negative for shortness of breath.   Gastrointestinal: Negative for blood in stool, heartburn and melena. Constipation: She thinks that Lasix has curdling affect on her stool - says that it is like "goat pellets"  Genitourinary: Positive for frequency. Negative for hematuria.  Musculoskeletal: Negative for joint pain.  Neurological: Negative for dizziness (Off-and-on but not routinely), focal weakness, weakness and headaches.  Psychiatric/Behavioral: The patient does not have insomnia (Has a hard time going back to sleep).   All other systems reviewed and are negative.   COVID-19 Education: The signs and symptoms of COVID-19 were discussed with the patient and how to seek care for testing (follow up with PCP or arrange E-visit).   The importance of social distancing was discussed today.   Past Medical History:  Diagnosis Date  . Abnormal x-ray of lungs with single pulmonary nodule 03/15/2016   per records from [place]   . Abnormal x-ray of lungs with single pulmonary nodule 03/15/2016   per records from [place]   . Anemia   . Asthma   . CHF (congestive heart failure) ([place])   . Cholelithiasis 05/19/2017   On CT  . Coronary artery calcification seen on CAT scan 05/19/2017  . Diabetes mellitus without complication ([place])    type 2  . Dilated idiopathic cardiomyopathy ([place]) 12/2015   EF 15-20%. Diagnosed in [facility]  . Headache    NONE RECENT  . Hypertension   . Malignant neoplasm of lower-outer quadrant of left breast of female, estrogen receptor negative ([place]) 06/11/2017   left breast  . Mixed hyperlipidemia 06/17/2018  . partially blind   . Personal history of chemotherapy 2018-2019  . Personal history of radiation therapy 2019  . Retinitis pigmentosa   .  Solitary pulmonary nodule on lung CT 03/16/2016   per records from [place]  . Uveitis    Past Surgical History:  Procedure Laterality Date  . brain cyst removed  2007   to help with headaches per patient , aspirated   . BREAST BIOPSY Left 06/06/2017   x2  . BREAST CYST ASPIRATION Left 06/06/2017  . BREAST LUMPECTOMY Left 06/26/2017  . BREAST LUMPECTOMY WITH RADIOACTIVE SEED AND SENTINEL LYMPH NODE BIOPSY Left 06/26/2017   Procedure: BREAST LUMPECTOMY WITH RADIOACTIVE SEED AND SENTINEL LYMPH NODE BIOPSY ERAS  PATHWAY;  Surgeon: [name], MD;  Location: [place];  Service: General;  Laterality: Left;  pec block  . FINE NEEDLE ASPIRATION Left 02/23/2019   Procedure: FINE NEEDLE ASPIRATION;  Surgeon: [name], MD;  Location: [place];  Service: General;  Laterality: Left;  Asiration of left breast seroma   . NASAL TURBINATE REDUCTION    . PORT-A-CATH REMOVAL Right 02/23/2019   Procedure: REMOVAL PORT-A-CATH;  Surgeon: [name], MD;  Location: [place];  Service: General;  Laterality: Right;  . PORTACATH PLACEMENT N/A 06/26/2017   Procedure: INSERTION PORT-A-CATH;  Surgeon: [name], MD;  Location: [place];  Service: General;  Laterality: N/A;  . TRANSTHORACIC ECHOCARDIOGRAM  12/2015   A) [facility] May 2017: Mild concentric LVH. Global hypokinesis. GR daily. EF 18% severe LA dilation. Mitral annular dilatation with papillary muscle dysfunction and moderate MR. Dilated IVC consistent with elevated RAP.  Marland Kitchen TRANSTHORACIC ECHOCARDIOGRAM  05/2017; 08/2017   a) Mild concentric LVH.  EF 45 to 50% with diffuse hypokinesis.  GR 1 DD.  Mild aortic root dilation.;; b)  Mild LVH EF 45 to 50%.  Diffuse HK.  No significant valve disease.  No significant change.     Current Meds  Medication Sig  . Accu-Chek FastClix Lancets MISC Test twice a day. Pt uses an accu-chek guide me meter  . albuterol (PROVENTIL HFA;VENTOLIN HFA) 108 (90 Base) MCG/ACT  inhaler Inhale 2 puffs into the lungs every 6 (six) hours as needed for wheezing or shortness of breath.  Marland Kitchen aspirin 81 MG chewable tablet Chew 81 mg by mouth daily.  Marland Kitchen atorvastatin (LIPITOR) 20 MG tablet Take 1 tablet (20 mg total) by mouth daily.  . Blood Glucose Monitoring Suppl (PRODIGY VOICE BLOOD GLUCOSE) w/Device KIT 1 Device by Does not apply route as directed.  . carvedilol (COREG) 12.5 MG tablet Take 1 tablet (12.5 mg total) by mouth 2 (two) times daily with a meal.  . Cholecalciferol (D3-1000 PO) Take by mouth.  . diclofenac (VOLTAREN) 75 MG EC tablet Take 1 tablet (75 mg total) by mouth 2 (two) times daily.  Marland Kitchen glucose blood test strip 2x daily. Pt uses accu-chek meter  . loratadine (CLARITIN) 10 MG tablet Take 1 tablet (10 mg total) by mouth daily.  . metFORMIN (GLUCOPHAGE) 1000 MG tablet Take 1 tablet (1,000 mg total) by mouth 2 (two) times daily with a meal.  . Multiple Vitamins-Minerals (WOMENS MULTI PO) Take by mouth.  . potassium chloride SA (K-DUR,KLOR-CON) 20 MEQ tablet Take 20 meq tablet when taking an extra lasix tablet daily as needed  . Saccharomyces boulardii (PROBIOTIC) 250 MG CAPS Take by mouth.  . sacubitril-valsartan (ENTRESTO) 97-103 MG Take 1 tablet by mouth 2 (two) times daily.  Marland Kitchen spironolactone (ALDACTONE) 25 MG tablet Take 1 tablet (25 mg total) by mouth daily.  . valACYclovir HCl (VALTREX PO) Take by mouth.  . vitamin B-12 (CYANOCOBALAMIN) 500 MCG tablet Take 500 mcg by mouth daily.  . vitamin C (ASCORBIC ACID) 500 MG tablet Take 1,000 mg 2 (two) times daily by mouth.  . [DISCONTINUED] furosemide (LASIX) 40 MG tablet TAKE 1 TAB BY MOUTH DAILY. MAY TAKE ADDITIONAL 40 MG TABLET IF WEIGHT GAIN OF 3LBS OR MORE OVERNIGHT  . [DISCONTINUED] furosemide (LASIX) 40 MG tablet Take 80 mg by mouth 2 (two) times daily. Take (80 mg) am and (40 ) pm X 6 days, Sundays only one tablet (40 mg ) twice daily.     Allergies:   Latex, Tylenol with codeine #3 [acetaminophen-codeine],  and Penicillins   Social History   Tobacco Use  . Smoking status: Former Smoker    Packs/day: 0.25    Years: 23.00    Pack years: 5.75    Types: Cigarettes    Quit date: 10/09/2015    Years since quitting: 3.8  . Smokeless tobacco: Never Used  Substance Use Topics  . Alcohol use: No  . Drug use: No  Family Hx: The patient's family history includes Breast cancer (age of onset: 60) in an other family member; Colon cancer (age of onset: 51) in her mother; Fibromyalgia in her sister; Heart attack in her father; Pancreatic cancer in an other family member. There is no history of Esophageal cancer, Rectal cancer, or Stomach cancer.   Labs/Other Tests and Data Reviewed:    EKG:  No ECG reviewed.  Recent Labs: 12/17/2018: TSH 3.920 04/02/2019: ALT 16; BUN 12; Creatinine, Ser 0.79; Hemoglobin 12.3; Platelets 314; Potassium 3.9; Sodium 141   Recent Lipid Panel Lab Results  Component Value Date/Time   CHOL 176 12/17/2018 09:00 AM   TRIG 220 (H) 12/17/2018 09:00 AM   HDL 29 (L) 12/17/2018 09:00 AM   CHOLHDL 6.1 (H) 12/17/2018 09:00 AM   CHOLHDL 4.2 03/20/2017 09:43 AM   LDLCALC 103 (H) 12/17/2018 09:00 AM    Wt Readings from Last 3 Encounters:  07/30/19 249 lb (112.9 kg)  06/18/19 247 lb (112 kg)  06/09/19 245 lb (111.1 kg)     Objective:    Vital Signs:  Ht '5\' 7"'  (1.702 m)   Wt 249 lb (112.9 kg)   BMI 39.00 kg/m  Usual BP 120-130s/80s VITAL SIGNS:  reviewed Healthy sounding female in no acute distress.  A&O x 3.  Normal Mood & Affect Non-labored respirations   ASSESSMENT & PLAN:    Problem List Items Addressed This Visit    Chronic combined systolic and diastolic heart failure ([place]) - Primary (Chronic)    Despite improved EF, she still has fluctuating weights. At this point, I would like for her to try just making her standing dose of Lasix 80 mg in the morning 40 mg at night for 6 days of the week and then take 1 day to take just 40 mg twice daily.  Eventually I  would like to get back down to doing maybe 4 days a week at the 80 mg morning dose.  She says that if she goes 3 days on the 40 mg twice daily dosing her weight will go up 4 to 5 pounds.  Until that resolves, we need to continue in order to avoid worsening dyspnea.  She is already on a pretty good dose of Entresto and carvedilol.      Essential hypertension (Chronic)   Dilated cardiomyopathy (HCC) (Chronic)    Thankfully, on aggressive medication, her EF has improved and stayed stable.  She asked about how long she needs to be on these medications, and I explained to her that pretty much I am fearful if we back off on the medicines that her EF will worsen.  With exception of some changes in weight, class I and at worst class II symptoms. Is on carvedilol, spironolactone and Entresto.  We are adjusting her Lasix in order to try to maintain her dry weight at home roughly 240-242  pounds.      Coronary artery calcification seen on CAT scan (Chronic)    No evidence of occlusive disease.  No angina exam.  EF improved. Would like to target LDL as low as 70 if possible.  Most recent check showed LDL of 103.  Triglycerides also mildly elevated to 20. She is not currently on any medications.  We will need to see her back in follow-up, we can discuss options of treatment if her LDL is not any significantly better (or gets worse).  If she were to have symptoms of exertional dyspnea or chest discomfort, would consider a full  coronary CT angiogram.      Mixed hyperlipidemia (Chronic)   Obesity (BMI 30-39.9) (Chronic)    Really hard to tell what her weight change is about.  It does seem that it is volume related and it may just be fluid buildup in the adiposity as she does not really notice edema.  We discussed her diet, and she seems to be doing okay without any salt load.  For now let us continue with titrating Lasix dosing to maintain stable weight.  The next goal will be for her actually to try to  lose weight with reducing p.o. intake and increasing her level activity.         COVID-19 Education: The signs and symptoms of COVID-19 were discussed with the patient and how to seek care for testing (follow up with PCP or arrange E-visit).   The importance of social distancing was discussed today.  Time:   Today, I have spent 35 minutes with the patient with telehealth technology discussing the above problems.     Patient Instructions  Medication Instructions:   Lets try doing 6 days a week 80 mg Lasix in the morning 40 mg in the evening and on Sundays to 40 mg twice a day.  You are having your labs checked by your cancer doctor on the 29th and we can assess those labs to see where he stands.   I would like to eventually get back to doing 4 days of 80 mg - 40 mg and 3 days of 40 mg twice a day --as long as you maintain relatively stable weights.   *If you need a refill on your cardiac medications before your next appointment, please call your pharmacy*  Lab Work:  Pls ask your cancer doctor to forward your labs from 08/10/2019 to Dr. Ellyn Hack  If you have labs (blood work) drawn today and your tests are completely normal, you will receive your results only by: Marland Kitchen MyChart Message (if you have MyChart) OR . A paper copy in the mail If you have any lab test that is abnormal or we need to change your treatment, we will call you to review the results.  Testing/Procedures:  n/a  Follow-Up: At [facility], you and your health needs are our priority.  As part of our continuing mission to provide you with exceptional heart care, we have created designated Provider Care Teams.  These Care Teams include your primary Cardiologist (physician) and Advanced Practice Providers (APPs -  Physician Assistants and Nurse Practitioners) who all work together to provide you with the care you need, when you need it.  Your next appointment:   6 month(s)  The format for your next appointment:     In Person  Provider:   Glenetta Hew, MD  Other Instructions If your weight remains stable, try to go back to 4 days/week doing 35m AM & 40 mg PM Lasix       Signed, DGlenetta Hew MD  07/30/2019 3:01 PM    [place]

## 2019-07-30 NOTE — Assessment & Plan Note (Signed)
No evidence of occlusive disease.  No angina exam.  EF improved. Would like to target LDL as low as 70 if possible.  Most recent check showed LDL of 103.  Triglycerides also mildly elevated to 20. She is not currently on any medications.  We will need to see her back in follow-up, we can discuss options of treatment if her LDL is not any significantly better (or gets worse).  If she were to have symptoms of exertional dyspnea or chest discomfort, would consider a full coronary CT angiogram.

## 2019-07-30 NOTE — Assessment & Plan Note (Signed)
Despite improved EF, she still has fluctuating weights. At this point, I would like for her to try just making her standing dose of Lasix 80 mg in the morning 40 mg at night for 6 days of the week and then take 1 day to take just 40 mg twice daily.  Eventually I would like to get back down to doing maybe 4 days a week at the 80 mg morning dose.  She says that if she goes 3 days on the 40 mg twice daily dosing her weight will go up 4 to 5 pounds.  Until that resolves, we need to continue in order to avoid worsening dyspnea.  She is already on a pretty good dose of Entresto and carvedilol.

## 2019-07-31 ENCOUNTER — Other Ambulatory Visit: Payer: Self-pay | Admitting: Family Medicine

## 2019-07-31 DIAGNOSIS — M25551 Pain in right hip: Secondary | ICD-10-CM

## 2019-07-31 DIAGNOSIS — G8929 Other chronic pain: Secondary | ICD-10-CM

## 2019-08-02 ENCOUNTER — Other Ambulatory Visit: Payer: Medicare HMO

## 2019-08-02 ENCOUNTER — Ambulatory Visit: Payer: Medicare HMO | Admitting: Hematology

## 2019-08-02 NOTE — Telephone Encounter (Signed)
Is this okay to refill? 

## 2019-08-02 NOTE — Telephone Encounter (Signed)
Is she taking this as needed or daily? Ok to give her one refill and if she is needing this on a regular basis, she needs a follow up.

## 2019-08-02 NOTE — Telephone Encounter (Signed)
Pt does not need this every day. She takes it as needed.

## 2019-08-10 ENCOUNTER — Ambulatory Visit: Payer: Medicare HMO | Admitting: Hematology

## 2019-08-10 ENCOUNTER — Other Ambulatory Visit: Payer: Medicare HMO

## 2019-08-10 ENCOUNTER — Telehealth: Payer: Self-pay | Admitting: Hematology

## 2019-08-10 NOTE — Telephone Encounter (Signed)
Called pt per 12/29 sch message - unable to reach pt . Left message for patient to call back to reschedule appt.

## 2019-08-11 ENCOUNTER — Telehealth: Payer: Self-pay | Admitting: Hematology

## 2019-08-11 NOTE — Telephone Encounter (Signed)
R/s appt per 12/29 sch message - pt aware of new apt date and time

## 2019-08-17 ENCOUNTER — Telehealth: Payer: Self-pay | Admitting: Internal Medicine

## 2019-08-17 NOTE — Telephone Encounter (Signed)
Pt called and asked if she could have her vitamin d and b12 checked. Pt was advised to call cancer doctor to ask if he can order it with his blood work on [employer]

## 2019-08-18 ENCOUNTER — Other Ambulatory Visit: Payer: Self-pay

## 2019-08-18 DIAGNOSIS — D649 Anemia, unspecified: Secondary | ICD-10-CM

## 2019-08-19 NOTE — Progress Notes (Signed)
Kristin Ward   [phone] Fax:([phone]) [phone]   Clinic Follow up Note   Patient Care Team: Girtha Rm, NP-C as PCP - General (Family Medicine) [name], MD as PCP - Cardiology (Cardiology) [name], MD as Consulting Physician (Hematology) [name], MD as Consulting Physician (General Surgery)  Date of Service:  08/20/2019  CHIEF COMPLAINT: f/u of left breast cancer, triple negative  SUMMARY OF ONCOLOGIC HISTORY: Oncology History Overview Note  Cancer Staging Malignant neoplasm of lower-outer quadrant of left breast of female, estrogen receptor negative ([place]) Staging form: Breast, AJCC 8th Edition - Clinical stage from 06/06/2017: Stage IB (cT1c, cN0, cM0, G3, ER: Negative, PR: Negative, HER2: Negative) - Signed by [name], MD on 06/15/2017 - Pathologic stage from 06/26/2017: Stage IB (pT1c, pN0, cM0, G3, ER: Negative, PR: Negative, HER2: Negative) - Signed by [name], MD on 07/10/2017     Malignant neoplasm of lower-outer quadrant of left breast of female, estrogen receptor negative ([place])  05/30/2017 Mammogram   Diagnostic mammo and US IMPRESSION: 1. Highly suspicious 1.4 cm mass in the slightly lower slightly outer left breast -tissue sampling recommended. 2. Indeterminate 0.5 mm mass in the slightly lower slightly outer left breast -tissue sampling recommended. 3. At least 2 left axillary lymph nodes with borderline cortical thickness.   06/06/2017 Initial Biopsy   Diagnosis 1. Breast, left, needle core biopsy, 5:30 o'clock - INVASIVE DUCTAL CARCINOMA, G3 2. Lymph node, needle/core biopsy, left axillary - NO CARCINOMA IDENTIFIED IN ONE LYMPH NODE (0/1)   06/06/2017 Initial Diagnosis   Malignant neoplasm of lower-outer quadrant of left breast of female, estrogen receptor negative ([place])   06/06/2017 Receptors her2   Estrogen Receptor: 0%, NEGATIVE Progesterone Receptor: 0%, NEGATIVE Proliferation Marker Ki67: 70%     06/26/2017 Surgery   LEFT BREAST LUMPECTOMY WITH RADIOACTIVE SEED AND SENTINEL LYMPH NODE BIOPSY ERAS  PATHWAY AND INSERTION PORT-A-CATH By Dr. Barry Dienes on 06/26/17    06/26/2017 Pathology Results   Diagnosis 06/26/17  1. Breast, lumpectomy, Left - INVASIVE DUCTAL CARCINOMA, GRADE III/III, SPANNING 1.2 CM. - THE SURGICAL RESECTION MARGINS ARE NEGATIVE FOR CARCINOMA. - SEE ONCOLOGY TABLE BELOW. 2. Lymph node, sentinel, biopsy, Left axillary #1 - THERE IS NO EVIDENCE OF CARCINOMA IN 1 OF 1 LYMPH NODE (0/1). 3. Lymph node, sentinel, biopsy, Left axillary #2 - THERE IS NO EVIDENCE OF CARCINOMA IN 1 OF 1 LYMPH NODE (0/1). 4. Lymph node, sentinel, biopsy, Left axillary #3 - THERE IS NO EVIDENCE OF CARCINOMA IN 1 OF 1 LYMPH NODE (0/1).    07/25/2017 - 09/29/2017 Chemotherapy   Adjuvant cytoxan and docetaxel (TC) every 3 weekd for 4 cycles     11/19/2017 - 12/17/2017 Radiation Therapy   Adjuvant breast radiation Left breast treated to 42.5 Gy with 17 fx of 2.5 Gy followed by a boost of 7.5 Gy with 3 fx of 2.5 Gy        CURRENT THERAPY:  Surveillance  INTERVAL HISTORY:  Kristin Ward is here for a follow up of left breast cancer. She was last seen by me 1 year ago and seen by NP Lacie 6 months ago in interim. She presents to the clinic alone. She notes she has endometrium mass as seen on pelvic US and plans to get this biopsied. She was told her ovaries were ovulating. She notes she does get abdominal cramps monthly around that time. She denies recent vaginal bleeding since 04/2019. She also notes right hip pain. She also has  fibroid in uterus and causes left abdominal pain.     REVIEW OF SYSTEMS:   Constitutional: Denies fevers, chills or abnormal weight loss Eyes: Denies blurriness of vision Ears, nose, mouth, throat, and face: Denies mucositis or sore throat Respiratory: Denies cough, dyspnea or wheezes Cardiovascular: Denies palpitation, chest discomfort or lower extremity  swelling Gastrointestinal:  Denies nausea, heartburn or change in bowel habits Skin: Denies abnormal skin rashes MSK: (+) Right hip pain  Lymphatics: Denies new lymphadenopathy or easy bruising Neurological:Denies numbness, tingling or new weaknesses Behavioral/Psych: Mood is stable, no new changes  All other systems were reviewed with the patient and are negative.  MEDICAL HISTORY:  Past Medical History:  Diagnosis Date  . Abnormal x-ray of lungs with single pulmonary nodule 03/15/2016   per records from [place]   . Abnormal x-ray of lungs with single pulmonary nodule 03/15/2016   per records from [place]   . Anemia   . Asthma   . CHF (congestive heart failure) ([place])   . Cholelithiasis 05/19/2017   On CT  . Coronary artery calcification seen on CAT scan 05/19/2017  . Diabetes mellitus without complication ([place])    type 2  . Dilated idiopathic cardiomyopathy ([place]) 12/2015   EF 15-20%. Diagnosed in [facility]  . Headache    NONE RECENT  . Hypertension   . Malignant neoplasm of lower-outer quadrant of left breast of female, estrogen receptor negative ([place]) 06/11/2017   left breast  . Mixed hyperlipidemia 06/17/2018  . partially blind   . Personal history of chemotherapy 2018-2019  . Personal history of radiation therapy 2019  . Retinitis pigmentosa   . Solitary pulmonary nodule on lung CT 03/16/2016   per records from [place]  . Uveitis     SURGICAL HISTORY: Past Surgical History:  Procedure Laterality Date  . brain cyst removed  2007   to help with headaches per patient , aspirated   . BREAST BIOPSY Left 06/06/2017   x2  . BREAST CYST ASPIRATION Left 06/06/2017  . BREAST LUMPECTOMY Left 06/26/2017  . BREAST LUMPECTOMY WITH RADIOACTIVE SEED AND SENTINEL LYMPH NODE BIOPSY Left 06/26/2017   Procedure: BREAST LUMPECTOMY WITH RADIOACTIVE SEED AND SENTINEL LYMPH NODE BIOPSY ERAS  PATHWAY;  Surgeon: [name], MD;  Location: [place];  Service: General;   Laterality: Left;  pec block  . FINE NEEDLE ASPIRATION Left 02/23/2019   Procedure: FINE NEEDLE ASPIRATION;  Surgeon: [name], MD;  Location: [place];  Service: General;  Laterality: Left;  Asiration of left breast seroma   . NASAL TURBINATE REDUCTION    . PORT-A-CATH REMOVAL Right 02/23/2019   Procedure: REMOVAL PORT-A-CATH;  Surgeon: [name], MD;  Location: [place];  Service: General;  Laterality: Right;  . PORTACATH PLACEMENT N/A 06/26/2017   Procedure: INSERTION PORT-A-CATH;  Surgeon: [name], MD;  Location: [place];  Service: General;  Laterality: N/A;  . TRANSTHORACIC ECHOCARDIOGRAM  12/2015   A) [facility] May 2017: Mild concentric LVH. Global hypokinesis. GR daily. EF 18% severe LA dilation. Mitral annular dilatation with papillary muscle dysfunction and moderate MR. Dilated IVC consistent with elevated RAP.  Marland Kitchen TRANSTHORACIC ECHOCARDIOGRAM  05/2017; 08/2017   a) Mild concentric LVH.  EF 45 to 50% with diffuse hypokinesis.  GR 1 DD.  Mild aortic root dilation.;; b)  Mild LVH EF 45 to 50%.  Diffuse HK.  No significant valve disease.  No significant change.    I have reviewed the social history and family  history with the patient and they are unchanged from previous note.  ALLERGIES:  is allergic to latex; tylenol with codeine #3 [acetaminophen-codeine]; and penicillins.  MEDICATIONS:  Current Outpatient Medications  Medication Sig Dispense Refill  . Accu-Chek FastClix Lancets MISC Test twice a day. Pt uses an accu-chek guide me meter 102 each 2  . aspirin 81 MG chewable tablet Chew 81 mg by mouth daily.    . Blood Glucose Monitoring Suppl (PRODIGY VOICE BLOOD GLUCOSE) w/Device KIT 1 Device by Does not apply route as directed. 1 kit 0  . carvedilol (COREG) 12.5 MG tablet Take 1 tablet (12.5 mg total) by mouth 2 (two) times daily with a meal. 180 tablet 1  . Cholecalciferol (D3-1000 PO) Take by mouth.    . diclofenac (VOLTAREN)  75 MG EC tablet TAKE 1 TABLET BY MOUTH TWICE A DAY 60 tablet 0  . furosemide (LASIX) 40 MG tablet Take (80 mg) am and (40 ) pm X 6 days, Sundays only one tablet (40 mg ) twice daily. 160 tablet 3  . glucose blood test strip 2x daily. Pt uses accu-chek meter 100 each 2  . loratadine (CLARITIN) 10 MG tablet Take 1 tablet (10 mg total) by mouth daily. 30 tablet 5  . metFORMIN (GLUCOPHAGE) 1000 MG tablet Take 1 tablet (1,000 mg total) by mouth 2 (two) times daily with a meal. 180 tablet 3  . Multiple Vitamins-Minerals (WOMENS MULTI PO) Take by mouth.    . potassium chloride SA (K-DUR,KLOR-CON) 20 MEQ tablet Take 20 meq tablet when taking an extra lasix tablet daily as needed 90 tablet 3  . Saccharomyces boulardii (PROBIOTIC) 250 MG CAPS Take by mouth.    . sacubitril-valsartan (ENTRESTO) 97-103 MG Take 1 tablet by mouth 2 (two) times daily. 180 tablet 3  . spironolactone (ALDACTONE) 25 MG tablet Take 1 tablet (25 mg total) by mouth daily. 90 tablet 1  . vitamin B-12 (CYANOCOBALAMIN) 500 MCG tablet Take 500 mcg by mouth daily.    . vitamin C (ASCORBIC ACID) 500 MG tablet Take 1,000 mg 2 (two) times daily by mouth.    Marland Kitchen albuterol (PROVENTIL HFA;VENTOLIN HFA) 108 (90 Base) MCG/ACT inhaler Inhale 2 puffs into the lungs every 6 (six) hours as needed for wheezing or shortness of breath. (Patient not taking: Reported on 08/20/2019) 1 Inhaler 0   No current facility-administered medications for this visit.    PHYSICAL EXAMINATION: ECOG PERFORMANCE STATUS: 1 - Symptomatic but completely ambulatory  Vitals:   08/20/19 0820  BP: (!) 155/86  Pulse: 86  Resp: 17  Temp: 97.7 F (36.5 C)  SpO2: 99%   Filed Weights   08/20/19 0820  Weight: 254 lb 6.4 oz (115.4 kg)    GENERAL:alert, no distress and comfortable SKIN: skin color, texture, turgor are normal, no rashes or significant lesions EYES: normal, Conjunctiva are pink and non-injected, sclera clear  NECK: supple, thyroid normal size, non-tender,  without nodularity LYMPH:  no palpable lymphadenopathy in the cervical, axillary  LUNGS: clear to auscultation and percussion with normal breathing effort HEART: regular rate & rhythm and no murmurs and no lower extremity edema ABDOMEN:abdomen soft, non-tender and normal bowel sounds Musculoskeletal:no cyanosis of digits and no clubbing  NEURO: alert & oriented x 3 with fluent speech, no focal motor/sensory deficits BREAST: S/p left lumpectomy: Surgical incision healed well with hard scar tissue or seroma at incision site measuring 3.5x6cm in the ILL of left breast. No palpable nodules or adenopathy bilaterally. Breast exam benign.  LABORATORY DATA:  I have reviewed the data as listed CBC Latest Ref Rng & Units 08/20/2019 04/02/2019 04/02/2019  WBC 4.0 - 10.5 K/uL 6.3 5.6 -  Hemoglobin 12.0 - 15.0 g/dL 12.1 12.3 -  Hematocrit 36.0 - 46.0 % 35.9(L) 37.2 37.1  Platelets 150 - 400 K/uL 313 314 -     CMP Latest Ref Rng & Units 08/20/2019 04/02/2019 02/01/2019  Glucose 70 - 99 mg/dL 379(H) 124(H) 312(H)  BUN 8 - 23 mg/dL _0 Creatinine 0.44 - 1.00 mg/dL 1.02(H) 0.79 0.97  Sodium 135 - 145 mmol/L 139 141 142  Potassium 3.5 - 5.1 mmol/L 4.1 3.9 4.6  Chloride 98 - 111 mmol/L 106 110 107  CO2 22 - 32 mmol/L _1 Calcium 8.9 - 10.3 mg/dL 9.3 9.2 9.4  Total Protein 6.5 - 8.1 g/dL 7.6 7.3 7.0  Total Bilirubin 0.3 - 1.2 mg/dL 0.4 0.4 0.6  Alkaline Phos 38 - 126 U/L 115 111 94  AST 15 - 41 U/L _2 ALT 0 - 44 U/L _3 RADIOGRAPHIC STUDIES: I have personally reviewed the radiological images as listed and agreed with the findings in the report. No results found.   ASSESSMENT & PLAN:  Kristin Ward is a 64 y.o. female with   1. 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 She has high risk for recurrence due to triple negative disease. She is s/p  left breast lumpectomy, adjuvant TC and Radiation.  -She had PAC removal in 02/2019.  -She is clinically doing well. Lab reviewed, her CBC and CMP are within normal limits except BG 379. Her physical exam and her 05/2019 mammogram were unremarkable with 3.5x6cm seroma/scar tissue around incision site of left breast.  There is no clinical concern for recurrence. -She is over 2 years since her diagnosis. I discussed her risk of recurrence decreases significantly after 3 years. Continue Surveillance. Next mammogram in 05/2020  -F/u in 6 months with NP Lacie    2. Genetic testing was negative for pathogenetic mutations.     3. Chronic combined systolic and diastolic heart failure, EF 45-50% -Follow-up with her cardiologist Dr. Ellyn Hack  -She is on lasix and spironolactone.  -I encouraged her to stand up slower due to very mild dizziness upon standing.   4. HTN, COPD, retinitis pigmentosa with limited vision -Continue to follow-up with primary care physician and cardiologist  -She is legally blind  5. Constipation -Presents with back/hip pain and bleeding with straining. She has tried Miralax and Senna with no relief -I encouraged her follow up with Dr. Silverio Decamp.  -Not mentioned today, likely well controlled.   6. Uterine Fibroids and Endometrial Thickening -As seen on 06/16/19 Pelvic US -Causes pain in her left abdomin monthly as if she is menstruating  -She planning to undergo biopsy soon and is considering hysterectomy.    PLAN: -She is clinically doing well, continue surveillance  -Lab and f/u in 6 months with NP Lacie     No problem-specific Assessment & Plan notes found for this encounter.   No orders of the defined types were placed in this encounter.  All questions were answered. The patient knows to call the clinic with any problems, questions or concerns. No barriers to learning was detected. The total time spent in the appointment was 20 minutes.  [name],  MD 08/20/2019   I, Joslyn Devon, am acting as scribe for [name], MD.   I have reviewed the above documentation for accuracy and completeness, and I agree with the above.

## 2019-08-20 ENCOUNTER — Inpatient Hospital Stay: Payer: Medicare HMO | Attending: Hematology

## 2019-08-20 ENCOUNTER — Inpatient Hospital Stay (HOSPITAL_BASED_OUTPATIENT_CLINIC_OR_DEPARTMENT_OTHER): Payer: Medicare HMO | Admitting: Hematology

## 2019-08-20 ENCOUNTER — Other Ambulatory Visit: Payer: Self-pay

## 2019-08-20 ENCOUNTER — Encounter: Payer: Self-pay | Admitting: Hematology

## 2019-08-20 ENCOUNTER — Telehealth: Payer: Self-pay | Admitting: Hematology

## 2019-08-20 VITALS — BP 155/86 | HR 86 | Temp 97.7°F | Resp 17 | Ht 67.0 in | Wt 254.4 lb

## 2019-08-20 DIAGNOSIS — Z853 Personal history of malignant neoplasm of breast: Secondary | ICD-10-CM | POA: Diagnosis not present

## 2019-08-20 DIAGNOSIS — J449 Chronic obstructive pulmonary disease, unspecified: Secondary | ICD-10-CM | POA: Insufficient documentation

## 2019-08-20 DIAGNOSIS — Z171 Estrogen receptor negative status [ER-]: Secondary | ICD-10-CM

## 2019-08-20 DIAGNOSIS — C50512 Malignant neoplasm of lower-outer quadrant of left female breast: Secondary | ICD-10-CM

## 2019-08-20 DIAGNOSIS — I110 Hypertensive heart disease with heart failure: Secondary | ICD-10-CM | POA: Diagnosis not present

## 2019-08-20 DIAGNOSIS — D259 Leiomyoma of uterus, unspecified: Secondary | ICD-10-CM | POA: Diagnosis not present

## 2019-08-20 DIAGNOSIS — I5042 Chronic combined systolic (congestive) and diastolic (congestive) heart failure: Secondary | ICD-10-CM | POA: Insufficient documentation

## 2019-08-20 DIAGNOSIS — K5900 Constipation, unspecified: Secondary | ICD-10-CM | POA: Diagnosis not present

## 2019-08-20 DIAGNOSIS — D649 Anemia, unspecified: Secondary | ICD-10-CM

## 2019-08-20 LAB — CBC WITH DIFFERENTIAL/PLATELET
Abs Immature Granulocytes: 0.03 10*3/uL (ref 0.00–0.07)
Basophils Absolute: 0 10*3/uL (ref 0.0–0.1)
Basophils Relative: 0 %
Eosinophils Absolute: 0.1 10*3/uL (ref 0.0–0.5)
Eosinophils Relative: 2 %
HCT: 35.9 % — ABNORMAL LOW (ref 36.0–46.0)
Hemoglobin: 12.1 g/dL (ref 12.0–15.0)
Immature Granulocytes: 1 %
Lymphocytes Relative: 37 %
Lymphs Abs: 2.3 10*3/uL (ref 0.7–4.0)
MCH: 30.2 pg (ref 26.0–34.0)
MCHC: 33.7 g/dL (ref 30.0–36.0)
MCV: 89.5 fL (ref 80.0–100.0)
Monocytes Absolute: 0.5 10*3/uL (ref 0.1–1.0)
Monocytes Relative: 8 %
Neutro Abs: 3.4 10*3/uL (ref 1.7–7.7)
Neutrophils Relative %: 52 %
Platelets: 313 10*3/uL (ref 150–400)
RBC: 4.01 MIL/uL (ref 3.87–5.11)
RDW: 12.8 % (ref 11.5–15.5)
WBC: 6.3 10*3/uL (ref 4.0–10.5)
nRBC: 0 % (ref 0.0–0.2)

## 2019-08-20 LAB — COMPREHENSIVE METABOLIC PANEL
ALT: 18 U/L (ref 0–44)
AST: 16 U/L (ref 15–41)
Albumin: 4.1 g/dL (ref 3.5–5.0)
Alkaline Phosphatase: 115 U/L (ref 38–126)
Anion gap: 10 (ref 5–15)
BUN: 19 mg/dL (ref 8–23)
CO2: 23 mmol/L (ref 22–32)
Calcium: 9.3 mg/dL (ref 8.9–10.3)
Chloride: 106 mmol/L (ref 98–111)
Creatinine, Ser: 1.02 mg/dL — ABNORMAL HIGH (ref 0.44–1.00)
GFR calc Af Amer: >60 mL/min (ref >60)
GFR calc non Af Amer: 59 mL/min — ABNORMAL LOW (ref >60)
Glucose, Bld: 379 mg/dL — ABNORMAL HIGH (ref 70–99)
Potassium: 4.1 mmol/L (ref 3.5–5.1)
Sodium: 139 mmol/L (ref 135–145)
Total Bilirubin: 0.4 mg/dL (ref 0.3–1.2)
Total Protein: 7.6 g/dL (ref 6.5–8.1)

## 2019-08-20 LAB — VITAMIN D 25 HYDROXY (VIT D DEFICIENCY, FRACTURES): Vit D, 25-Hydroxy: 19.61 ng/mL — ABNORMAL LOW (ref 30–100)

## 2019-08-20 NOTE — Telephone Encounter (Signed)
Scheduled appt per 1/8 los.  Sent a message to HIM pool to get a calendar mailed out. 

## 2019-08-24 ENCOUNTER — Other Ambulatory Visit: Payer: Self-pay

## 2019-08-24 DIAGNOSIS — E559 Vitamin D deficiency, unspecified: Secondary | ICD-10-CM

## 2019-08-24 MED ORDER — ERGOCALCIFEROL 1.25 MG (50000 UT) PO CAPS: 50000.0000 [IU] | ORAL_CAPSULE | ORAL | 0 refills | Status: DC

## 2019-08-25 ENCOUNTER — Telehealth: Payer: Self-pay | Admitting: Cardiology

## 2019-08-25 ENCOUNTER — Other Ambulatory Visit: Payer: Self-pay | Admitting: Adult Health

## 2019-08-25 ENCOUNTER — Telehealth: Payer: Self-pay

## 2019-08-25 ENCOUNTER — Other Ambulatory Visit: Payer: Self-pay

## 2019-08-25 ENCOUNTER — Other Ambulatory Visit: Payer: Self-pay | Admitting: Cardiology

## 2019-08-25 MED ORDER — ACCU-CHEK GUIDE ME W/DEVICE KIT: 1.0000 | PACK | Freq: Three times a day (TID) | 0 refills | Status: DC

## 2019-08-25 NOTE — Telephone Encounter (Signed)
Spoke to pt and informed her that I would send new rx for meter. Informed pt that once she has meter to check her blood sugars as she had been previously 3 times daily for next few days and then call me back with those readings and that I would forward those to Dr. Kelton Pillar for review.

## 2019-08-25 NOTE — Telephone Encounter (Signed)
LMTCB

## 2019-08-25 NOTE — Telephone Encounter (Signed)
Pts phone uses robocalling and has blocked my number but able to leave another message for pt to call our office back. Will close chart and hopeful pt will call back at another time.

## 2019-08-25 NOTE — Telephone Encounter (Signed)
Patient called in wanting to know if she could get another Rx sent in for her Accu check meter. Patient also states that she was recently sick for the past 10 days and the last time she checked her blood sugar levels they were elevated it was 376. Wanting to know if she needs to be seen or what advise the Dr has for her    Please call and advise    PHARMACY:  [id number] - [place], [place] - [address]

## 2019-08-25 NOTE — Telephone Encounter (Signed)
Pt c/o medication issue:  1. Name of Medication: potassium chloride SA (K-DUR,KLOR-CON) 20 MEQ tablet  2. How are you currently taking this medication (dosage and times per day)? Unsure of when and how to take medication  3. Are you having a reaction (difficulty breathing--STAT)? No   4. What is your medication issue? Patient would like a call back explaining how she is to take her medication.

## 2019-08-26 DIAGNOSIS — R9389 Abnormal findings on diagnostic imaging of other specified body structures: Secondary | ICD-10-CM | POA: Diagnosis not present

## 2019-08-26 DIAGNOSIS — N898 Other specified noninflammatory disorders of vagina: Secondary | ICD-10-CM | POA: Diagnosis not present

## 2019-08-26 DIAGNOSIS — R102 Pelvic and perineal pain: Secondary | ICD-10-CM | POA: Diagnosis not present

## 2019-08-26 LAB — METHYLMALONIC ACID, SERUM: Methylmalonic Acid, Quantitative: 183 nmol/L (ref 0–378)

## 2019-08-27 ENCOUNTER — Other Ambulatory Visit: Payer: Self-pay | Admitting: Family Medicine

## 2019-08-27 DIAGNOSIS — E119 Type 2 diabetes mellitus without complications: Secondary | ICD-10-CM

## 2019-08-29 ENCOUNTER — Encounter: Payer: Self-pay | Admitting: Hematology

## 2019-08-31 ENCOUNTER — Telehealth: Payer: Self-pay | Admitting: *Deleted

## 2019-08-31 ENCOUNTER — Other Ambulatory Visit: Payer: Self-pay

## 2019-08-31 ENCOUNTER — Ambulatory Visit
Admission: RE | Admit: 2019-08-31 | Discharge: 2019-08-31 | Disposition: A | Discharge status: Home / Self Care | Payer: Medicare HMO | Source: Ambulatory Visit | Attending: Nurse Practitioner | Admitting: Nurse Practitioner

## 2019-08-31 DIAGNOSIS — Z780 Asymptomatic menopausal state: Secondary | ICD-10-CM | POA: Diagnosis not present

## 2019-08-31 DIAGNOSIS — E2839 Other primary ovarian failure: Secondary | ICD-10-CM

## 2019-08-31 NOTE — Telephone Encounter (Signed)
Called pt & informed of normal Dexa Scan per Lacie Burton's instructions & to continue calcium & Vit D.  Pt expressed understanding.

## 2019-08-31 NOTE — Telephone Encounter (Signed)
-----   Message from Alla Feeling, NP sent at 08/31/2019  1:57 PM EST ----- Please let her know DEXA is normal. Continue calcium and vitamin D.  Thanks, Regan Rakers

## 2019-09-04 ENCOUNTER — Other Ambulatory Visit: Payer: Self-pay | Admitting: Family Medicine

## 2019-09-04 DIAGNOSIS — M25551 Pain in right hip: Secondary | ICD-10-CM

## 2019-09-04 DIAGNOSIS — G8929 Other chronic pain: Secondary | ICD-10-CM

## 2019-09-06 NOTE — Telephone Encounter (Signed)
Pt does not need this med as she says it does not work. She has not been seeing ortho. I will cancelling

## 2019-09-06 NOTE — Telephone Encounter (Signed)
Is this okay to refill? 

## 2019-09-06 NOTE — Telephone Encounter (Signed)
I think she is seeing ortho? If so, I will defer to them

## 2019-09-08 ENCOUNTER — Other Ambulatory Visit: Payer: Self-pay

## 2019-09-08 ENCOUNTER — Telehealth: Payer: Self-pay

## 2019-09-08 MED ORDER — INSULIN ASPART PROT & ASPART (70-30 MIX) 100 UNIT/ML PEN: 24.0000 [IU] | PEN_INJECTOR | Freq: Two times a day (BID) | SUBCUTANEOUS | 11 refills | Status: DC

## 2019-09-08 NOTE — Telephone Encounter (Signed)
Pt informed of change and rx sent to [facility] as pt requested.

## 2019-09-08 NOTE — Telephone Encounter (Signed)
Pt stated that she has had high and elevated blood sugars for the last 3 days. Readings are as follows: 1/26 486 lunch Hi dinner Hi bedtime 1/25 Hi morning Hi lunch 425 dinner 1/24 416 morning 386 lunch Hi dinner Hi bedtime  pt currently on metformin 1000 mg 2 times daily and stated that she recently started progesterone 10 mg, please advise.

## 2019-09-09 ENCOUNTER — Other Ambulatory Visit: Payer: Self-pay

## 2019-09-09 MED ORDER — PEN NEEDLES 32G X 6 MM MISC: 1.0000 | Freq: Two times a day (BID) | 6 refills | Status: DC

## 2019-10-06 NOTE — Progress Notes (Signed)
Kristin Ward is a 64 y.o. female who presents for annual wellness visit, CPE and follow-up on chronic medical conditions.  She has the following concerns:  Here alone today. She took SCAT to get here today.   States she has an appt with her cardiologist Dr. Ellyn Hack next month.  He is managing her HTN, CHF, HL States he adjusted her Lasix dose and her weight more stable between 245-247 Denies having edema.   Diabetes managed by Dr. Leonette Monarch   No longer has a port-a- cath   She was put on medroxyprogesterone for endometrial thickening since January 2021 by Dr. Gita Kudo, OB/GYN   Vitamin D def- Dr.Feng checked this and adjusted dose of vitamin D supplement.   HL- has not been on a statin. States she never picked this up from her pharmacy.     There is no immunization history on file for this patient. Last Pap smear: January 2021- with obgyn Last mammogram: October 2020 Last colonoscopy: 2019 and due for 5 year recall  Last DEXA: January 2021 and normal  Dentist: does not have a dentist  Ophtho: Dr. Katy Fitch Exercise: walking  Other doctors caring for patient include: Dr. Ellyn Hack- cardiology Dr. Kelton Pillar- Endo Dr. Earle Gell- GI Dr. Burr Medico- oncology Dr. Johnette Abraham. [place] - OBGYN Dr. Zadie Rhine- retina specialist Dr. Katy Fitch- eyes    Depression screen:  See questionnaire below.  Depression screen [facility] 2/9 10/07/2019 04/09/2019 12/16/2018 06/17/2018 11/06/2017  Decreased Interest 0 0 0 0 0  Down, Depressed, Hopeless 0 0 0 0 0  PHQ - 2 Score 0 0 0 0 0    Fall Risk Screen: see questionnaire below. Fall Risk  10/07/2019 04/09/2019 12/16/2018 06/17/2018 01/26/2018  Falls in the past year? 0 0 0 0 No  Number falls in past yr: 0 - 0 0 -  Injury with Fall? 0 - 0 0 -  Risk for fall due to : - - - - -    ADL screen:  See questionnaire below Functional Status Survey: Is the patient deaf or have difficulty hearing?: Yes Does the patient have difficulty seeing, even when wearing glasses/contacts?:  Yes(blind) Does the patient have difficulty concentrating, remembering, or making decisions?: No Does the patient have difficulty walking or climbing stairs?: No Does the patient have difficulty dressing or bathing?: No Does the patient have difficulty doing errands alone such as visiting a doctor's office or shopping?: Yes   End of Life Discussion:  Patient does not have a living will and medical power of attorney. I gave her another packet today. MOST form reviewed and signed with no changes. She is a full code.   Review of Systems Constitutional: -fever, -chills, -sweats, -unexpected weight change, -anorexia, -fatigue Allergy: -sneezing, -itching, -congestion Dermatology: denies changing moles, rash, lumps, new worrisome lesions ENT: -runny nose, -ear pain, -sore throat, -hoarseness, -sinus pain, -teeth pain, -tinnitus, -hearing loss, -epistaxis Cardiology:  -chest pain, -palpitations, -edema, -orthopnea, -paroxysmal nocturnal dyspnea Respiratory: -cough, -shortness of breath, -dyspnea on exertion, -wheezing, -hemoptysis Gastroenterology: -abdominal pain, -nausea, -vomiting, -diarrhea, -constipation, -blood in stool, -changes in bowel movement, -dysphagia Hematology: -bleeding or bruising problems Musculoskeletal: -arthralgias, -myalgias, -joint swelling, -back pain, -neck pain, -cramping, -gait changes Ophthalmology: -vision changes, -eye redness, -itching, -discharge Urology: -dysuria, -difficulty urinating, -hematuria, -urinary frequency, -urgency, incontinence Neurology: -headache, -weakness, -tingling, -numbness, -speech abnormality, -memory loss, -falls, -dizziness Psychology:  -depressed mood, -agitation, -sleep problems    PHYSICAL EXAM:  BP 110/68   Pulse 78   Ht 5\' 9"  (1.753 m)  Wt 239 lb 9.6 oz (108.7 kg)   SpO2 96%   BMI 35.38 kg/m   General Appearance: Alert, cooperative, no distress, appears stated age Head: Normocephalic, without obvious abnormality,  atraumatic Eyes: PERRL, conjunctiva/corneas clear Ears: Normal TM's and external ear canals Nose: mask in place  Throat: mask in place  Neck: Supple, no lymphadenopathy; thyroid: no enlargement/tenderness/nodules; no JVD Back: Spine nontender, no curvature, ROM normal, no CVA tenderness Lungs: Clear to auscultation bilaterally without wheezes, rales or ronchi; respirations unlabored Chest Wall: No tenderness or deformity Heart: Regular rate and rhythm Breast Exam: OB/GYN Abdomen: Soft, non-tender, nondistended, normoactive bowel sounds, no masses, no hepatosplenomegaly Genitalia: OB/GYN Extremities: No clubbing, cyanosis or edema Pulses: 2+ and symmetric all extremities Skin: Skin color, texture, turgor normal, no rashes or lesions Lymph nodes: Cervical, supraclavicular, and axillary nodes normal Neurologic: CNII-XII intact, normal strength, sensation and gait; reflexes 2+ and symmetric throughout Psych: Normal mood, affect, hygiene and grooming.  ASSESSMENT/PLAN: Medicare annual wellness visit, subsequent -Here today for Medicare wellness visit.  She came alone via [facility]. Denies any recent falls, depression, memory issues.  She does have blindness but states she is able to care for herself.  Her daughters also help her.  Discussed advanced directives and encouraged her to talk with her daughters and fill the forms out.  Routine general medical examination at a health care facility -Reviewed preventive health care.  She has an OB/GYN and is up-to-date on mammogram and Pap smear.  Bone density done and normal.  Colonoscopy is up-to-date.  Reviewed immunizations and she is deficient.  Declines to have immunizations other than Tdap.  Advised that she would need to get this at her pharmacy due to insurance  Mixed hyperlipidemia - Plan: atorvastatin (LIPITOR) 20 MG tablet -Reports having never taken a statin.  I did prescribe this but she states she never picked it up from pharmacy.  I will  send this to the pharmacy once again.  Encouraged follow-up in 6 weeks for fasting lipids  Essential hypertension -Blood pressure in goal range.  Managed by her cardiologist  Blind in both eyes  Obesity (BMI 30-39.9) -Counseling on healthy diet and exercise  Vitamin D deficiency -Currently taking a supplement.  Reviewed vitamin D level from January which was low  Endometrial thickening on ultrasound -Managed by her OB/GYN  Elevated serum creatinine -Continue to monitor kidney function  Chronic combined systolic and diastolic heart failure ([place]) -Appears euvolemic today.  Managed by her cardiologist    Discussed monthly self breast exams and yearly mammograms; at least 30 minutes of aerobic activity at least 5 days/week and weight-bearing exercise 2x/week; proper sunscreen use reviewed; healthy diet, including goals of calcium and vitamin D intake and alcohol recommendations (less than or equal to 1 drink/day) reviewed; regular seatbelt use; changing batteries in smoke detectors.  Immunization recommendations discussed.  Colonoscopy recommendations reviewed   Medicare Attestation I have personally reviewed: The patient's medical and social history Their use of alcohol, tobacco or illicit drugs Their current medications and supplements The patient's functional ability including ADLs,fall risks, home safety risks, cognitive, and hearing and visual impairment Diet and physical activities Evidence for depression or mood disorders  The patient's weight, height, and BMI have been recorded in the chart.  I have made referrals, counseling, and provided education to the patient based on review of the above and I have provided the patient with a written personalized care plan for preventive services.     Harland Dingwall, NP-C  10/07/2019   

## 2019-10-07 ENCOUNTER — Ambulatory Visit (INDEPENDENT_AMBULATORY_CARE_PROVIDER_SITE_OTHER): Payer: Medicare HMO | Admitting: Family Medicine

## 2019-10-07 ENCOUNTER — Other Ambulatory Visit: Payer: Self-pay

## 2019-10-07 ENCOUNTER — Encounter: Payer: Self-pay | Admitting: Family Medicine

## 2019-10-07 VITALS — BP 110/68 | HR 78 | Ht 69.0 in | Wt 239.6 lb

## 2019-10-07 DIAGNOSIS — R9389 Abnormal findings on diagnostic imaging of other specified body structures: Secondary | ICD-10-CM | POA: Diagnosis not present

## 2019-10-07 DIAGNOSIS — I10 Essential (primary) hypertension: Secondary | ICD-10-CM | POA: Diagnosis not present

## 2019-10-07 DIAGNOSIS — Z0000 Encounter for general adult medical examination without abnormal findings: Secondary | ICD-10-CM | POA: Diagnosis not present

## 2019-10-07 DIAGNOSIS — E669 Obesity, unspecified: Secondary | ICD-10-CM | POA: Diagnosis not present

## 2019-10-07 DIAGNOSIS — I5042 Chronic combined systolic (congestive) and diastolic (congestive) heart failure: Secondary | ICD-10-CM

## 2019-10-07 DIAGNOSIS — E782 Mixed hyperlipidemia: Secondary | ICD-10-CM | POA: Diagnosis not present

## 2019-10-07 DIAGNOSIS — R7989 Other specified abnormal findings of blood chemistry: Secondary | ICD-10-CM

## 2019-10-07 DIAGNOSIS — E559 Vitamin D deficiency, unspecified: Secondary | ICD-10-CM | POA: Diagnosis not present

## 2019-10-07 DIAGNOSIS — H543 Unqualified visual loss, both eyes: Secondary | ICD-10-CM | POA: Diagnosis not present

## 2019-10-07 MED ORDER — ATORVASTATIN CALCIUM 20 MG PO TABS: 20.0000 mg | ORAL_TABLET | Freq: Every day | ORAL | 1 refills | Status: DC

## 2019-10-07 NOTE — Patient Instructions (Addendum)
Call and schedule with Dr. Allison Quarry office   Call and schedule for a dental exam.   Continue on your current medications and follow up with your specialists as recommended.   You can go to your pharmacy and get your Tdap (tetnaus shot).   Return your advance directives at your convenience. You have the packet.   Follow up in 6 months.      Kristin Ward , Thank you for taking time to come for your Medicare Wellness Visit. I appreciate your ongoing commitment to your health goals. Please review the following plan we discussed and let me know if I can assist you in the future.   These are the goals we discussed: Goals   None     This is a list of the screening recommended for you and due dates:  Health Maintenance  Topic Date Due  . Pneumococcal vaccine  06/24/1960  . Complete foot exam   06/24/1968  . Tetanus Vaccine  06/24/1977  . Flu Shot  03/13/2019  . Hemoglobin A1C  12/16/2019  . Urine Protein Check  12/17/2019  . Pap Smear  04/07/2020  . Eye exam for diabetics  04/25/2020  . Mammogram  06/02/2021  . Colon Cancer Screening  06/10/2023  .  Hepatitis C: One time screening is recommended by Center for Disease Control  (CDC) for  adults born from [birth year] through 1963.   Completed  . HIV Screening  Completed

## 2019-10-13 ENCOUNTER — Other Ambulatory Visit: Payer: Self-pay | Admitting: Hematology

## 2019-10-13 DIAGNOSIS — E559 Vitamin D deficiency, unspecified: Secondary | ICD-10-CM

## 2019-10-18 ENCOUNTER — Other Ambulatory Visit: Payer: Self-pay

## 2019-10-20 ENCOUNTER — Other Ambulatory Visit: Payer: Self-pay

## 2019-10-20 ENCOUNTER — Encounter: Payer: Self-pay | Admitting: Internal Medicine

## 2019-10-20 ENCOUNTER — Ambulatory Visit (INDEPENDENT_AMBULATORY_CARE_PROVIDER_SITE_OTHER): Payer: Medicare HMO | Admitting: Internal Medicine

## 2019-10-20 VITALS — BP 116/78 | HR 89 | Temp 98.1°F | Ht 69.0 in | Wt 240.2 lb

## 2019-10-20 DIAGNOSIS — E1165 Type 2 diabetes mellitus with hyperglycemia: Secondary | ICD-10-CM | POA: Diagnosis not present

## 2019-10-20 LAB — POCT GLYCOSYLATED HEMOGLOBIN (HGB A1C): Hemoglobin A1C: 10.6 % — AB (ref 4.0–5.6)

## 2019-10-20 MED ORDER — INSULIN ASPART PROT & ASPART (70-30 MIX) 100 UNIT/ML PEN: 20.0000 [IU] | PEN_INJECTOR | Freq: Two times a day (BID) | SUBCUTANEOUS | 11 refills | Status: DC

## 2019-10-20 MED ORDER — RYBELSUS 3 MG PO TABS: 3.0000 mg | ORAL_TABLET | Freq: Every day | ORAL | 3 refills | Status: DC

## 2019-10-20 NOTE — Patient Instructions (Addendum)
-   Continue Metformin 1000 mg Twice a day with meals - Start Rybelsus 3 mg daily with breakfast  - Decrease Novolog Mix to 20 units with Breakfast and Supper, but if you are unable to get the Rybelsus, continue taking it at 24 units.      HOW TO TREAT LOW BLOOD SUGARS (Blood sugar LESS THAN 70 MG/DL)  Please follow the RULE OF 15 for the treatment of hypoglycemia treatment (when your (blood sugars are less than 70 mg/dL)    STEP 1: Take 15 grams of carbohydrates when your blood sugar is low, which includes:   3-4 GLUCOSE TABS  OR  3-4 OZ OF JUICE OR REGULAR SODA OR  ONE TUBE OF GLUCOSE GEL     STEP 2: RECHECK blood sugar in 15 MINUTES STEP 3: If your blood sugar is still low at the 15 minute recheck --> then, go back to STEP 1 and treat AGAIN with another 15 grams of carbohydrates.

## 2019-10-20 NOTE — Progress Notes (Signed)
Name: Kristin Ward  Age/ Sex: 64 y.o., female   MRN/ DOB: [id number], 03-17-1956     PCP: Girtha Rm, NP-C   Reason for Endocrinology Evaluation: Type 2 Diabetes Mellitus  Initial Endocrine Consultative Visit: 01/27/19    PATIENT IDENTIFIER: Kristin Ward is a 64 y.o. female with a past medical history of Retinitis Pigmentosa, T2DM, HTN,macular degeneration and CHF. The patient has followed with Endocrinology clinic since 01/27/19 for consultative assistance with management of her diabetes.  DIABETIC HISTORY:  [name] was diagnosed with T2 DM in 12/2018, she presented with symptomatic hyperglycemia. She was started on Metformin, Basaglar and Tradjenta. Her hemoglobin A1c was 13.0% on initial diagnosis.   Novolog Mix started 01/2019 but she stopped it by 02/2019 due to hypoglycemia SUBJECTIVE:   During the last visit (06/18/2019): She had stopped her insulin on her own, we continued metformin with an A1c of 6.7%      Today (10/20/2019): Ms. Hush is here for a 3 month  follow up on diabetes.She did contact our office in 08/2019 with hyperglycemia with BG's > 400 and was advised to restart insulin mix, she had been on medroxyprogesterone for endometrial hyperplasia in 08/2019.    She checks her blood sugars 2 times daily, preprandial to breakfast and evening . The patient has not had hypoglycemic episodes since the last clinic visit.Otherwise, the patient has not required any recent emergency interventions for hypoglycemia and has not had recent hospitalizations secondary to hyper or hypoglycemic episodes.     ROS: As per HPI and as detailed below: Review of Systems  Gastrointestinal: Negative for diarrhea and nausea.      HOME DIABETES REGIMEN:  Metformin 1000 mg BID Novolog Mix 24 units BID with meals   METER DOWNLOAD SUMMARY: 2/25-3/05/2020 Fingerstick Blood Glucose Tests = 15 Average Number Tests/Day = 1.1 Overall Mean FS Glucose = 169   BG  Ranges: Low = 119 High = 257  Hypoglycemic Events/30 Days: BG < 50 = 0 Episodes of symptomatic severe hypoglycemia = 0      HISTORY:  Past Medical History:  Past Medical History:  Diagnosis Date  . Abnormal x-ray of lungs with single pulmonary nodule 03/15/2016   per records from [place]   . Abnormal x-ray of lungs with single pulmonary nodule 03/15/2016   per records from [place]   . Anemia   . Asthma   . CHF (congestive heart failure) ([place])   . Cholelithiasis 05/19/2017   On CT  . Coronary artery calcification seen on CAT scan 05/19/2017  . Diabetes mellitus without complication ([place])    type 2  . Dilated idiopathic cardiomyopathy ([employer]) 12/2015   EF 15-20%. Diagnosed in [facility]  . Headache    NONE RECENT  . Hypertension   . Malignant neoplasm of lower-outer quadrant of left breast of female, estrogen receptor negative ([place]) 06/11/2017   left breast  . Mixed hyperlipidemia 06/17/2018  . partially blind   . Personal history of chemotherapy 2018-2019  . Personal history of radiation therapy 2019  . Retinitis pigmentosa   . Solitary pulmonary nodule on lung CT 03/16/2016   per records from [place]  . Uveitis    Past Surgical History:  Past Surgical History:  Procedure Laterality Date  . brain cyst removed  2007   to help with headaches per patient , aspirated   . BREAST BIOPSY Left 06/06/2017   x2  . BREAST CYST ASPIRATION Left 06/06/2017  . BREAST LUMPECTOMY Left  06/26/2017  . BREAST LUMPECTOMY WITH RADIOACTIVE SEED AND SENTINEL LYMPH NODE BIOPSY Left 06/26/2017   Procedure: BREAST LUMPECTOMY WITH RADIOACTIVE SEED AND SENTINEL LYMPH NODE BIOPSY ERAS  PATHWAY;  Surgeon: [name], MD;  Location: [place];  Service: General;  Laterality: Left;  pec block  . FINE NEEDLE ASPIRATION Left 02/23/2019   Procedure: FINE NEEDLE ASPIRATION;  Surgeon: [name], MD;  Location: [place];  Service: General;  Laterality: Left;   Asiration of left breast seroma   . NASAL TURBINATE REDUCTION    . PORT-A-CATH REMOVAL Right 02/23/2019   Procedure: REMOVAL PORT-A-CATH;  Surgeon: [name], MD;  Location: [place];  Service: General;  Laterality: Right;  . PORTACATH PLACEMENT N/A 06/26/2017   Procedure: INSERTION PORT-A-CATH;  Surgeon: [name], MD;  Location: [place];  Service: General;  Laterality: N/A;  . TRANSTHORACIC ECHOCARDIOGRAM  12/2015   A) [facility] May 2017: Mild concentric LVH. Global hypokinesis. GR daily. EF 18% severe LA dilation. Mitral annular dilatation with papillary muscle dysfunction and moderate MR. Dilated IVC consistent with elevated RAP.  [facility] Kitchen TRANSTHORACIC ECHOCARDIOGRAM  05/2017; 08/2017   a) Mild concentric LVH.  EF 45 to 50% with diffuse hypokinesis.  GR 1 DD.  Mild aortic root dilation.;; b)  Mild LVH EF 45 to 50%.  Diffuse HK.  No significant valve disease.  No significant change.    Social History:  reports that she quit smoking about 4 years ago. Her smoking use included cigarettes. She has a 5.75 pack-year smoking history. She has never used smokeless tobacco. She reports that she does not drink alcohol or use drugs. Family History:  Family History  Problem Relation Age of Onset  . Colon cancer Mother 26       deceased 65  . Heart attack Father   . Breast cancer Other 49       2nd cousin on maternal side; currently 28  . Pancreatic cancer Other        2nd cousin once removed on maternal side; deceased 49s  . Fibromyalgia Sister   . Esophageal cancer Neg Hx   . Rectal cancer Neg Hx   . Stomach cancer Neg Hx      HOME MEDICATIONS: Allergies as of 10/20/2019      Reactions   Latex Hives, Itching   Burning    Tylenol With Codeine #3 [acetaminophen-codeine] Nausea And Vomiting   Penicillins Nausea Only, Swelling, Rash   Has patient had a PCN reaction causing immediate rash, facial/tongue/throat swelling, SOB or lightheadedness with hypotension:  Yes Has patient had a PCN reaction causing severe rash involving mucus membranes or skin necrosis: Yes Has patient had a PCN reaction that required hospitalization: No Has patient had a PCN reaction occurring within the last 10 years: No TONGUE SWELLING AND RASH AROUND MOUTH If all of the above answers are "NO", then may proceed with Cephalosporin use.      Medication List       Accurate as of October 20, 2019 10:11 AM. If you have any questions, ask your nurse or doctor.        Accu-Chek FastClix Lancets Misc TEST TWICE A DAY. PT USES AN ACCU-CHEK GUIDE ME METER   Accu-Chek Guide Me w/Device Kit 1 Package by Does not apply route 3 (three) times daily. Use as directed to check blood sugar 3 times daily Ell.9   albuterol 108 (90 Base) MCG/ACT inhaler Commonly known as: VENTOLIN HFA Inhale 2 puffs into  the lungs every 6 (six) hours as needed for wheezing or shortness of breath.   aspirin 81 MG chewable tablet Chew 81 mg by mouth daily.   atorvastatin 20 MG tablet Commonly known as: LIPITOR Take 1 tablet (20 mg total) by mouth daily.   carvedilol 12.5 MG tablet Commonly known as: COREG Take 1 tablet (12.5 mg total) by mouth 2 (two) times daily with a meal.   D3-1000 PO Take by mouth.   diclofenac 75 MG EC tablet Commonly known as: VOLTAREN TAKE 1 TABLET BY MOUTH TWICE A DAY   furosemide 40 MG tablet Commonly known as: LASIX Take (80 mg) am and (40 ) pm X 6 days, Sundays only one tablet (40 mg ) twice daily.   glucose blood test strip 2x daily. Pt uses accu-chek meter   insulin aspart protamine - aspart (70-30) 100 UNIT/ML FlexPen Commonly known as: NOVOLOG 70/30 MIX Inject 0.24 mLs (24 Units total) into the skin 2 (two) times daily with a meal.   loratadine 10 MG tablet Commonly known as: CLARITIN Take 1 tablet (10 mg total) by mouth daily.   medroxyPROGESTERone 10 MG tablet Commonly known as: PROVERA   metFORMIN 1000 MG tablet Commonly known as:  GLUCOPHAGE Take 1 tablet (1,000 mg total) by mouth 2 (two) times daily with a meal.   Pen Needles 32G X 6 MM Misc Inject 1 Package into the skin 2 (two) times daily with a meal.   potassium chloride SA 20 MEQ tablet Commonly known as: Klor-Con M20 TAKE 20 MEQ TABLET WHEN TAKING AN EXTRA LASIX TABLET DAILY AS NEEDED   Probiotic 250 MG Caps Take by mouth.   sacubitril-valsartan 97-103 MG Commonly known as: ENTRESTO Take 1 tablet by mouth 2 (two) times daily.   spironolactone 25 MG tablet Commonly known as: ALDACTONE Take 1 tablet (25 mg total) by mouth daily.   vitamin B-12 500 MCG tablet Commonly known as: CYANOCOBALAMIN Take 500 mcg by mouth daily.   vitamin C 500 MG tablet Commonly known as: ASCORBIC ACID Take 1,000 mg 2 (two) times daily by mouth.   WOMENS MULTI PO Take by mouth.        OBJECTIVE:   Vital Signs: BP 116/78 (BP Location: Right Arm, Patient Position: Sitting, Cuff Size: Large)   Pulse 89   Temp 98.1 F (36.7 C)   Ht '5\' 9"'  (1.753 m)   Wt 240 lb 3.2 oz (109 kg)   SpO2 98%   BMI 35.47 kg/m   Wt Readings from Last 3 Encounters:  10/20/19 240 lb 3.2 oz (109 kg)  10/07/19 239 lb 9.6 oz (108.7 kg)  08/20/19 254 lb 6.4 oz (115.4 kg)     Exam: General: Pt appears well and is in NAD  Lungs: Clear with good BS bilat with no rales, rhonchi, or wheezes  Heart: RRR with normal S1 and S2 and no gallops; no murmurs; no rub  Abdomen: Normoactive bowel sounds, soft, nontender, without masses or organomegaly palpable  Extremities: No pretibial edema. No tremor.   Skin: Normal texture and temperature to palpation. No rash noted. No Acanthosis nigricans/skin tags. No lipohypertrophy.  Neuro: MS is good with appropriate affect, pt is alert and Ox3         DM foot exam: 10/20/2019  The skin of the feet is intact without sores or ulcerations. The pedal pulses are 2+ on right and 2+ on left. The sensation is intact to a screening 5.07, 10 gram monofilament  bilaterally  DATA REVIEWED:  Lab Results  Component Value Date   HGBA1C 10.6 (A) 10/20/2019   HGBA1C 6.7 (A) 06/18/2019   HGBA1C 13.0 (A) 12/17/2018   Lab Results  Component Value Date   LDLCALC 103 (H) 12/17/2018   CREATININE 1.02 (H) 08/20/2019     Lab Results  Component Value Date   CHOL 176 12/17/2018   HDL 29 (L) 12/17/2018   LDLCALC 103 (H) 12/17/2018   TRIG 220 (H) 12/17/2018   CHOLHDL 6.1 (H) 12/17/2018         ASSESSMENT / PLAN / RECOMMENDATIONS:   1) Type 2 Diabetes Mellitus, Poorly  Controlled, Without complications - Most recent A1c of 10.6  %. Goal A1c < 7.0 %.    - Pt admits to a period in time especially around the holidays where she was having dietary indiscretions and was not checking glucose as much. But since restarting the insulin 08/2019 , BG's have been improving.  - We discussed add-on therapy with GLp-1 agonists , we discussed GI side effects, no prior diagnosis of pancreatitis, pt in agreement to start Rybelsus , will adjust insulin as well to avoid hypoglycemia.  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   MEDICATIONS:  Continue Metformin 1000 mg BID   Start Rybelsus 3 mg daily with Breakfast   Decrease Novolog Mix to 20 units BID   EDUCATION / INSTRUCTIONS:  BG monitoring instructions: Patient is instructed to check her blood sugars 2 times a day, fasting and supper.  Call [place] [facility] clinic if: BG persistently < 70 or > 300. . I reviewed the Rule of 15 for the treatment of hypoglycemia in detail with the patient. Literature supplied.    F/U in 3 month   Signed electronically by: Mack Guise, MD  [facility] Endocrinology  [facility] [address] [place], [place] [address] Phone: [phone] FAX: [phone]   CC: Girtha Rm, NP-C [address] [address] Phone:  [phone]  Fax: [phone]  Return to Endocrinology clinic as below: Future Appointments  Date Time Provider [place]  11/18/2019  9:00 AM [facility] NURSE [facility]  02/16/2020  8:45 AM [facility] LAB 3 [facility] None  02/16/2020  9:15 AM Alla Feeling, NP [facility] None  04/05/2020  9:00 AM Girtha Rm, NP-C [facility]

## 2019-10-21 DIAGNOSIS — E109 Type 1 diabetes mellitus without complications: Secondary | ICD-10-CM | POA: Insufficient documentation

## 2019-10-21 DIAGNOSIS — E1165 Type 2 diabetes mellitus with hyperglycemia: Secondary | ICD-10-CM | POA: Insufficient documentation

## 2019-10-28 ENCOUNTER — Telehealth: Payer: Self-pay | Admitting: Internal Medicine

## 2019-10-28 DIAGNOSIS — E782 Mixed hyperlipidemia: Secondary | ICD-10-CM

## 2019-10-28 DIAGNOSIS — R252 Cramp and spasm: Secondary | ICD-10-CM

## 2019-10-28 NOTE — Telephone Encounter (Signed)
It appears that I am only checking lipids since she is being followed by her endocrinologist regularly for diabetes and vitamin D. Please ask her if this is her understanding as well. If so, please put in a future lipid panel. Thanks.

## 2019-10-28 NOTE — Telephone Encounter (Signed)
Pt has upcoming appt for labs but no future orders have been placed. Please put in orders

## 2019-10-29 NOTE — Telephone Encounter (Signed)
Put in future orders  

## 2019-10-29 NOTE — Telephone Encounter (Signed)
Please put in lipid panel (hyperlipidemia) and serum magnesium due to leg cramps.

## 2019-10-29 NOTE — Telephone Encounter (Signed)
Pt states she wanted lipids and magnesium checked. She wants magnesoum checked due to leg cramps 1-2 times a week

## 2019-11-01 DIAGNOSIS — H5010 Unspecified exotropia: Secondary | ICD-10-CM | POA: Diagnosis not present

## 2019-11-01 DIAGNOSIS — H20013 Primary iridocyclitis, bilateral: Secondary | ICD-10-CM | POA: Diagnosis not present

## 2019-11-01 DIAGNOSIS — H209 Unspecified iridocyclitis: Secondary | ICD-10-CM | POA: Diagnosis not present

## 2019-11-01 DIAGNOSIS — H3552 Pigmentary retinal dystrophy: Secondary | ICD-10-CM | POA: Diagnosis not present

## 2019-11-01 DIAGNOSIS — H43812 Vitreous degeneration, left eye: Secondary | ICD-10-CM | POA: Diagnosis not present

## 2019-11-01 DIAGNOSIS — H35353 Cystoid macular degeneration, bilateral: Secondary | ICD-10-CM | POA: Diagnosis not present

## 2019-11-01 DIAGNOSIS — E119 Type 2 diabetes mellitus without complications: Secondary | ICD-10-CM | POA: Diagnosis not present

## 2019-11-01 DIAGNOSIS — H2513 Age-related nuclear cataract, bilateral: Secondary | ICD-10-CM | POA: Diagnosis not present

## 2019-11-01 DIAGNOSIS — H179 Unspecified corneal scar and opacity: Secondary | ICD-10-CM | POA: Diagnosis not present

## 2019-11-16 ENCOUNTER — Telehealth: Payer: Self-pay | Admitting: Family Medicine

## 2019-11-16 DIAGNOSIS — G8929 Other chronic pain: Secondary | ICD-10-CM

## 2019-11-16 DIAGNOSIS — E782 Mixed hyperlipidemia: Secondary | ICD-10-CM

## 2019-11-16 NOTE — Telephone Encounter (Signed)
[employer] req Atorvastatin 20 mg, Diclofenac Sodium 75 mg delayed release, Metformin 1000 mg tab

## 2019-11-17 MED ORDER — DICLOFENAC SODIUM 75 MG PO TBEC: 75.0000 mg | DELAYED_RELEASE_TABLET | Freq: Two times a day (BID) | ORAL | 1 refills | Status: DC

## 2019-11-17 MED ORDER — ATORVASTATIN CALCIUM 20 MG PO TABS: 20.0000 mg | ORAL_TABLET | Freq: Every day | ORAL | 1 refills | Status: DC

## 2019-11-17 NOTE — Telephone Encounter (Signed)
Refilled meds. Pt was advised to call endo for DM med

## 2019-11-17 NOTE — Telephone Encounter (Signed)
She sees endocrinology for her diabetes so she will need to get Metformin refills approved by them. Ok for diclofenac and atorvastatin

## 2019-11-17 NOTE — Telephone Encounter (Signed)
Is it ok to refill diclofenac ?

## 2019-11-18 ENCOUNTER — Other Ambulatory Visit: Payer: Medicare HMO

## 2019-11-19 ENCOUNTER — Other Ambulatory Visit: Payer: Self-pay

## 2019-11-19 MED ORDER — CARVEDILOL 12.5 MG PO TABS: 12.5000 mg | ORAL_TABLET | Freq: Two times a day (BID) | ORAL | 1 refills | Status: DC

## 2019-11-19 MED ORDER — SACUBITRIL-VALSARTAN 97-103 MG PO TABS: 1.0000 | ORAL_TABLET | Freq: Two times a day (BID) | ORAL | 3 refills | Status: DC

## 2019-11-19 MED ORDER — POTASSIUM CHLORIDE CRYS ER 20 MEQ PO TBCR: EXTENDED_RELEASE_TABLET | ORAL | 3 refills | Status: DC

## 2019-11-22 ENCOUNTER — Other Ambulatory Visit: Payer: Self-pay | Admitting: *Deleted

## 2019-11-22 ENCOUNTER — Other Ambulatory Visit: Payer: Self-pay

## 2019-11-25 ENCOUNTER — Telehealth: Payer: Self-pay

## 2019-11-25 ENCOUNTER — Other Ambulatory Visit: Payer: Self-pay

## 2019-11-25 DIAGNOSIS — R102 Pelvic and perineal pain: Secondary | ICD-10-CM | POA: Diagnosis not present

## 2019-11-25 DIAGNOSIS — N803 Endometriosis of pelvic peritoneum: Secondary | ICD-10-CM | POA: Diagnosis not present

## 2019-11-25 MED ORDER — PEN NEEDLES 32G X 6 MM MISC: 1.0000 | Freq: Two times a day (BID) | 6 refills | Status: DC

## 2019-11-25 MED ORDER — RYBELSUS 3 MG PO TABS: 3.0000 mg | ORAL_TABLET | Freq: Every day | ORAL | 3 refills | Status: DC

## 2019-11-25 MED ORDER — INSULIN ASPART PROT & ASPART (70-30 MIX) 100 UNIT/ML PEN: 20.0000 [IU] | PEN_INJECTOR | Freq: Two times a day (BID) | SUBCUTANEOUS | 11 refills | Status: DC

## 2019-11-25 NOTE — Telephone Encounter (Signed)
Received fax from [place] for refills on the pts. Atorvastatin, Metformin, and Diclofenac Sodium, pt. Last apt was 10/07/19.

## 2019-11-25 NOTE — Telephone Encounter (Signed)
We just refilled atorvastation and diclofenac back on 11/17/19 and she gets metformin from her Endocrinology

## 2019-12-02 ENCOUNTER — Other Ambulatory Visit: Payer: Self-pay

## 2019-12-02 MED ORDER — SPIRONOLACTONE 25 MG PO TABS: 25.0000 mg | ORAL_TABLET | Freq: Every day | ORAL | 1 refills | Status: DC

## 2019-12-02 MED ORDER — FUROSEMIDE 40 MG PO TABS: ORAL_TABLET | ORAL | 3 refills | Status: DC

## 2019-12-06 DIAGNOSIS — D251 Intramural leiomyoma of uterus: Secondary | ICD-10-CM | POA: Diagnosis not present

## 2019-12-06 DIAGNOSIS — R102 Pelvic and perineal pain: Secondary | ICD-10-CM | POA: Diagnosis not present

## 2019-12-06 DIAGNOSIS — R9389 Abnormal findings on diagnostic imaging of other specified body structures: Secondary | ICD-10-CM | POA: Diagnosis not present

## 2020-01-06 ENCOUNTER — Other Ambulatory Visit: Payer: Medicare HMO

## 2020-01-06 ENCOUNTER — Other Ambulatory Visit: Payer: Self-pay

## 2020-01-06 DIAGNOSIS — R252 Cramp and spasm: Secondary | ICD-10-CM

## 2020-01-06 DIAGNOSIS — E782 Mixed hyperlipidemia: Secondary | ICD-10-CM

## 2020-01-07 LAB — LIPID PANEL
Chol/HDL Ratio: 3.6 ratio (ref 0.0–4.4)
Cholesterol, Total: 114 mg/dL (ref 100–199)
HDL: 32 mg/dL — ABNORMAL LOW (ref >39)
LDL Chol Calc (NIH): 66 mg/dL (ref 0–99)
Triglycerides: 81 mg/dL (ref 0–149)
VLDL Cholesterol Cal: 16 mg/dL (ref 5–40)

## 2020-01-07 LAB — MAGNESIUM: Magnesium: 2 mg/dL (ref 1.6–2.3)

## 2020-01-20 ENCOUNTER — Ambulatory Visit: Payer: Medicare HMO | Admitting: Internal Medicine

## 2020-01-24 DIAGNOSIS — I110 Hypertensive heart disease with heart failure: Secondary | ICD-10-CM | POA: Diagnosis not present

## 2020-01-24 DIAGNOSIS — R1030 Lower abdominal pain, unspecified: Secondary | ICD-10-CM | POA: Diagnosis not present

## 2020-01-24 DIAGNOSIS — N859 Noninflammatory disorder of uterus, unspecified: Secondary | ICD-10-CM | POA: Diagnosis not present

## 2020-01-24 DIAGNOSIS — Z880 Allergy status to penicillin: Secondary | ICD-10-CM | POA: Diagnosis not present

## 2020-01-24 DIAGNOSIS — I509 Heart failure, unspecified: Secondary | ICD-10-CM | POA: Diagnosis not present

## 2020-01-24 DIAGNOSIS — Z91040 Latex allergy status: Secondary | ICD-10-CM | POA: Diagnosis not present

## 2020-01-24 DIAGNOSIS — Z885 Allergy status to narcotic agent status: Secondary | ICD-10-CM | POA: Diagnosis not present

## 2020-01-24 DIAGNOSIS — N852 Hypertrophy of uterus: Secondary | ICD-10-CM | POA: Diagnosis not present

## 2020-01-24 DIAGNOSIS — K8021 Calculus of gallbladder without cholecystitis with obstruction: Secondary | ICD-10-CM | POA: Diagnosis not present

## 2020-01-24 DIAGNOSIS — N858 Other specified noninflammatory disorders of uterus: Secondary | ICD-10-CM | POA: Diagnosis not present

## 2020-01-25 NOTE — Progress Notes (Signed)
Cardiology Office Note   Date:  01/26/2020   ID:  Kristin Ward, DOB 12-Aug-1956, MRN [id number]  PCP:  Girtha Rm, NP-C  Cardiologist: Dr. Ellyn Hack CC: Follow Up   History of Present Illness: Kristin Ward is a 64 y.o. female who presents for ongoing assessment and management of nonischemic cardiomyopathy potentially related to chemotherapy along with diagnosis of breast cancer treated 2018 in 2019 with Cytoxan and Docetaxel with adjacent radiation therapy.  She was last seen by Dr. Ellyn Hack on 07/30/2019 via telemedicine.  The patient is physically active and walks most days stopped going to the [facility] due to pandemic restrictions.  She has been watching her weight but noticing it fluctuating up and down about 4 pounds.  She was using Lasix 40 mg twice daily versus 80 mg in the a.m. and 40 mg in the p.m.  She will take extra doses should this be necessary.  She has not been noticing any lower extremity edema.  She does sleep on extra pillows due to GERD but not for orthopnea or PND.  On last visit dated 07/30/2019 Dr. Ellyn Hack would like her to taking her standard dose of Lasix 80 mg in the morning and 40 mg at night for 6 days of the week and then take 1 day a week to reduce it to 40 mg twice daily.  His plan was to reduce her eventually to 4 days a week and 80 mg dose.  She was to continue to weigh and report any weight gain of 4 to 5 pounds.  She was to continue her dose of Entresto and carvedilol.  Her dry weight at home was between 240 and 242 pounds.  She comes today with noncardiac complaints of abdominal pain.  She apparently has a significant amount of uterine fibroids.  She was seen at a hospital in [facility] with a CT scan.  We do not have results of this.  She was advised to follow-up with primary OB/GYN.  She denies any fluid retention, dyspnea on exertion, edema, chest discomfort, or significant fatigue.  Her weight has been stable.  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 Diagnosed in [facility]   Headache    NONE RECENT   Hypertension    Malignant neoplasm of lower-outer quadrant of left breast of female, estrogen receptor negative ([place]) 06/11/2017   left breast   Mixed hyperlipidemia 06/17/2018   partially blind    Personal history of chemotherapy 2018-2019   Personal history of radiation therapy 2019   Retinitis pigmentosa    Solitary pulmonary nodule on lung CT 03/16/2016   per records from [facility]   Uveitis     Past Surgical History:  Procedure Laterality Date   brain cyst removed  2007   to help with headaches per patient , aspirated    BREAST BIOPSY Left 06/06/2017   x2   BREAST CYST ASPIRATION Left 06/06/2017   BREAST LUMPECTOMY Left  06/26/2017   BREAST LUMPECTOMY WITH RADIOACTIVE SEED AND SENTINEL LYMPH NODE BIOPSY Left 06/26/2017   Procedure: BREAST LUMPECTOMY WITH RADIOACTIVE SEED AND SENTINEL LYMPH NODE BIOPSY ERAS  PATHWAY;  Surgeon: [name], MD;  Location: [place];  Service: General;  Laterality: Left;  pec block   FINE NEEDLE ASPIRATION Left 02/23/2019   Procedure: FINE NEEDLE ASPIRATION;  Surgeon: [name], MD;  Location: [place];  Service: General;  Laterality: Left;  Asiration of left breast seroma    NASAL TURBINATE REDUCTION     PORT-A-CATH REMOVAL Right 02/23/2019   Procedure: REMOVAL PORT-A-CATH;  Surgeon: [name], MD;  Location: [place];  Service:  General;  Laterality: Right;   PORTACATH PLACEMENT N/A 06/26/2017   Procedure: INSERTION PORT-A-CATH;  Surgeon: [name], MD;  Location: [place];  Service: General;  Laterality: N/A;   TRANSTHORACIC ECHOCARDIOGRAM  12/2015   A) [facility] May 2017: Mild concentric LVH. Global hypokinesis. GR daily. EF 18% severe LA dilation. Mitral annular dilatation with papillary muscle dysfunction and moderate MR. Dilated IVC consistent with elevated RAP.   TRANSTHORACIC ECHOCARDIOGRAM  05/2017; 08/2017   a) Mild concentric LVH.  EF 45 to 50% with diffuse hypokinesis.  GR 1 DD.  Mild aortic root dilation.;; b)  Mild LVH EF 45 to 50%.  Diffuse HK.  No significant valve disease.  No significant change.     Current Outpatient Medications  Medication Sig Dispense Refill   Accu-Chek FastClix Lancets MISC TEST TWICE A DAY. PT USES AN ACCU-CHEK GUIDE ME METER 102 each 2   albuterol (PROVENTIL HFA;VENTOLIN HFA) 108 (90 Base) MCG/ACT inhaler Inhale 2 puffs into the lungs every 6 (six) hours as needed for wheezing or shortness of breath. 1 Inhaler 0   aspirin 81 MG chewable tablet Chew 81 mg by mouth daily.     atorvastatin (LIPITOR) 20 MG tablet Take 1 tablet (20 mg total) by mouth daily. 90 tablet 1   Blood Glucose Monitoring Suppl (ACCU-CHEK GUIDE ME) w/Device KIT 1 Package by Does not apply route 3 (three) times daily. Use as directed to check blood sugar 3 times daily Ell.9 1 kit 0   carvedilol (COREG) 12.5 MG tablet Take 1 tablet (12.5 mg total) by mouth 2 (two) times daily with a meal. 180 tablet 1   Cholecalciferol (D3-1000 PO) Take by mouth.     diclofenac (VOLTAREN) 75 MG EC tablet Take 1 tablet (75 mg total) by mouth 2 (two) times daily. 60 tablet 1   furosemide (LASIX) 40 MG tablet Take (80 mg) am and (40 ) pm X 6 days, Sundays only one tablet (40 mg ) twice daily. 160 tablet 3   glucose blood test strip 2x daily. Pt uses accu-chek meter 100 each 2   insulin aspart protamine -  aspart (NOVOLOG 70/30 MIX) (70-30) 100 UNIT/ML FlexPen Inject 0.2 mLs (20 Units total) into the skin 2 (two) times daily with a meal. 15 mL 11   Insulin Pen Needle (PEN NEEDLES) 32G X 6 MM MISC Inject 1 Package into the skin 2 (two) times daily with a meal. 100 each 6   loratadine (CLARITIN) 10 MG tablet Take 1 tablet (10 mg total) by mouth daily. 30 tablet 5   medroxyPROGESTERone (PROVERA) 10 MG tablet      metFORMIN (GLUCOPHAGE) 1000 MG tablet Take 1 tablet (1,000 mg total) by mouth 2 (two) times daily with a meal. 180 tablet 3   Multiple Vitamins-Minerals (WOMENS MULTI PO) Take  by mouth.     potassium chloride SA (KLOR-CON M20) 20 MEQ tablet TAKE 20 MEQ TABLET WHEN TAKING AN EXTRA LASIX TABLET DAILY AS NEEDED 90 tablet 3   Saccharomyces boulardii (PROBIOTIC) 250 MG CAPS Take by mouth.     sacubitril-valsartan (ENTRESTO) 97-103 MG Take 1 tablet by mouth 2 (two) times daily. 180 tablet 3   Semaglutide (RYBELSUS) 3 MG TABS Take 3 mg by mouth daily with breakfast. 30 tablet 3   spironolactone (ALDACTONE) 25 MG tablet Take 1 tablet (25 mg total) by mouth daily. 90 tablet 1   vitamin B-12 (CYANOCOBALAMIN) 500 MCG tablet Take 500 mcg by mouth daily.     vitamin C (ASCORBIC ACID) 500 MG tablet Take 1,000 mg 2 (two) times daily by mouth.     No current facility-administered medications for this visit.    Allergies:   Latex, Tylenol with codeine #3 [acetaminophen-codeine], and Penicillins    Social History:  The patient  reports that she quit smoking about 4 years ago. Her smoking use included cigarettes. She has a 5.75 pack-year smoking history. She has never used smokeless tobacco. She reports that she does not drink alcohol and does not use drugs.   Family History:  The patient's family history includes Breast cancer (age of onset: 29) in an other family member; Colon cancer (age of onset: 62) in her mother; Fibromyalgia in her sister; Heart attack in her father; Pancreatic cancer in an  other family member.    ROS: All other systems are reviewed and negative. Unless otherwise mentioned in H&P    PHYSICAL EXAM: VS:  BP 130/80    Pulse 83    Ht _0  (1.702 m)    Wt 241 lb (109.3 kg)    BMI 37.75 kg/m  , BMI Body mass index is 37.75 kg/m. GEN: Well nourished, well developed, in no acute distress HEENT: normal Neck: no JVD, carotid bruits, or masses Cardiac:RRR; no murmurs, rubs, or gallops,no edema  Respiratory:  Clear to auscultation bilaterally, normal work of breathing GI: soft, nontender, nondistended, + BS MS: no deformity or atrophy Skin: warm and dry, no rash Neuro:  Strength and sensation are intact Psych: euthymic mood, full affect   EKG: Not completed this office visit  Recent Labs: 08/20/2019: ALT 18; BUN 19; Creatinine, Ser 1.02; Hemoglobin 12.1; Platelets 313; Potassium 4.1; Sodium 139 01/06/2020: Magnesium 2.0    Lipid Panel    Component Value Date/Time   CHOL 114 01/06/2020 0820   TRIG 81 01/06/2020 0820   HDL 32 (L) 01/06/2020 0820   CHOLHDL 3.6 01/06/2020 0820   CHOLHDL 4.2 03/20/2017 0943   VLDL 21 03/20/2017 0943   LDLCALC 66 01/06/2020 0820      Wt Readings from Last 3 Encounters:  01/26/20 241 lb (109.3 kg)  10/20/19 240 lb 3.2 oz (109 kg)  10/07/19 239 lb 9.6 oz (108.7 kg)      Other studies Reviewed: Echocardiogram 2017/09/04 Left ventricle: The cavity size was normal. Wall thickness was  increased in a pattern of mild LVH. Systolic function was mildly  reduced. The estimated ejection fraction was in the range of 45%  to 50%. Diffuse hypokinesis. Doppler parameters are consistent  with abnormal left ventricular relaxation (grade 1 diastolic  dysfunction).  - Aortic valve: There was no stenosis.  - Mitral valve: Mildly calcified annulus. Normal thickness leaflets  . There was trivial regurgitation.  - Right ventricle: The cavity size was normal. Systolic function  was normal.  -  Tricuspid valve: Peak RV-RA  gradient (S): 14 mm Hg.  - Pulmonary arteries: PA peak pressure: 17 mm Hg (S).  - Inferior vena cava: The vessel was normal in size. The  respirophasic diameter changes were in the normal range (>= 50%),  consistent with normal central venous pressure.   ASSESSMENT AND PLAN:  1.  Nonischemic cardiomyopathy: She remains on Lasix 80 mg daily.  I will check a BMET today.  There is no evidence of fluid overload on exam.  Continue low-sodium diet, increase exercise and weight loss is recommended.  2.  Hypertension: Blood pressures well controlled currently.  No changes in her medication regimen.  3.  History of breast cancer: Currently in remission.  She did undergo chemo and radiation therapy.  Being followed by PCP and oncology.  4.  Uterine fibroids: Causing a good bit of abdominal discomfort.  She is to follow-up with OB/GYN.  She appears stable from a cardiac standpoint and is cleared to have surgery should this be necessary.  Current medicines are reviewed at length with the patient today.  I have spent 25 minutes dedicated to the care of this patient on the date of this encounter to include pre-visit review of records, assessment, management and diagnostic testing,with shared decision making.  Labs/ tests ordered today include: BMET   Phill Myron. West Pugh, ANP, AACC   01/26/2020 12:23 PM    [facility] [place] [address] Office [phone] Fax [phone]  Notice: This dictation was prepared with Dragon dictation along with smaller phrase technology. Any transcriptional errors that result from this process are unintentional and may not be corrected upon review.

## 2020-01-26 ENCOUNTER — Ambulatory Visit (INDEPENDENT_AMBULATORY_CARE_PROVIDER_SITE_OTHER): Payer: Medicare HMO | Admitting: Adult Health

## 2020-01-26 ENCOUNTER — Other Ambulatory Visit: Payer: Self-pay

## 2020-01-26 ENCOUNTER — Encounter: Payer: Self-pay | Admitting: Adult Health

## 2020-01-26 VITALS — BP 130/80 | HR 83 | Ht 67.0 in | Wt 241.0 lb

## 2020-01-26 DIAGNOSIS — I10 Essential (primary) hypertension: Secondary | ICD-10-CM | POA: Diagnosis not present

## 2020-01-26 DIAGNOSIS — I5022 Chronic systolic (congestive) heart failure: Secondary | ICD-10-CM

## 2020-01-26 DIAGNOSIS — I420 Dilated cardiomyopathy: Secondary | ICD-10-CM

## 2020-01-26 DIAGNOSIS — Z79899 Other long term (current) drug therapy: Secondary | ICD-10-CM

## 2020-01-26 NOTE — Patient Instructions (Signed)
Medication Instructions:  Continue current medications  *If you need a refill on your cardiac medications before your next appointment, please call your pharmacy*   Lab Work: BMP Today  If you have labs (blood work) drawn today and your tests are completely normal, you will receive your results only by:  [place] (if you have MyChart) OR  A paper copy in the mail If you have any lab test that is abnormal or we need to change your treatment, we will call you to review the results.   Testing/Procedures: None Ordered   Follow-Up: At [facility], you and your health needs are our priority.  As part of our continuing mission to provide you with exceptional heart care, we have created designated Provider Care Teams.  These Care Teams include your primary Cardiologist (physician) and Advanced Practice Providers (APPs -  Physician Assistants and Nurse Practitioners) who all work together to provide you with the care you need, when you need it.  We recommend signing up for the patient portal called "MyChart".  Sign up information is provided on this After Visit Summary.  MyChart is used to connect with patients for Virtual Visits (Telemedicine).  Patients are able to view lab/test results, encounter notes, upcoming appointments, etc.  Non-urgent messages can be sent to your provider as well.   To learn more about what you can do with MyChart, go to [email or web address].    Your next appointment:   6 month(s)  The format for your next appointment:   In Person  Provider:   You may see Glenetta Hew, MD or one of the following Advanced Practice Providers on your designated Care Team:    Rosaria Ferries, PA-C  Jory Sims, DNP, ANP  Cadence Kathlen Mody, NP

## 2020-01-28 DIAGNOSIS — R102 Pelvic and perineal pain: Secondary | ICD-10-CM | POA: Diagnosis not present

## 2020-02-15 NOTE — Progress Notes (Addendum)
[place]   [phone] [phone]   Clinic Follow up Note   Patient Care Team: Girtha Rm, NP-C as PCP - General (Family Medicine) [name], MD as PCP - Cardiology (Cardiology) [name], MD as Consulting Physician (Hematology) [name], MD as Consulting Physician (General Surgery) 02/16/2020  CHIEF COMPLAINT: F/u triple negative left breast cancer   SUMMARY OF ONCOLOGIC HISTORY: Oncology History Overview Note  Cancer Staging Malignant neoplasm of lower-outer quadrant of left breast of female, estrogen receptor negative ([place]) Staging form: Breast, AJCC 8th Edition - Clinical stage from 06/06/2017: Stage IB (cT1c, cN0, cM0, G3, ER: Negative, PR: Negative, HER2: Negative) - Signed by [name], MD on 06/15/2017 - Pathologic stage from 06/26/2017: Stage IB (pT1c, pN0, cM0, G3, ER: Negative, PR: Negative, HER2: Negative) - Signed by [name], MD on 07/10/2017     Malignant neoplasm of lower-outer quadrant of left breast of female, estrogen receptor negative ([place])  05/30/2017 Mammogram   Diagnostic mammo and US IMPRESSION: 1. Highly suspicious 1.4 cm mass in the slightly lower slightly outer left breast -tissue sampling recommended. 2. Indeterminate 0.5 mm mass in the slightly lower slightly outer left breast -tissue sampling recommended. 3. At least 2 left axillary lymph nodes with borderline cortical thickness.   06/06/2017 Initial Biopsy   Diagnosis 1. Breast, left, needle core biopsy, 5:30 o'clock - INVASIVE DUCTAL CARCINOMA, G3 2. Lymph node, needle/core biopsy, left axillary - NO CARCINOMA IDENTIFIED IN ONE LYMPH NODE (0/1)   06/06/2017 Initial Diagnosis   Malignant neoplasm of lower-outer quadrant of left breast of female, estrogen receptor negative ([place])   06/06/2017 Receptors her2   Estrogen Receptor: 0%, NEGATIVE Progesterone Receptor: 0%, NEGATIVE Proliferation Marker Ki67: 70%   06/26/2017 Surgery   LEFT  BREAST LUMPECTOMY WITH RADIOACTIVE SEED AND SENTINEL LYMPH NODE BIOPSY ERAS  PATHWAY AND INSERTION PORT-A-CATH By Dr. Barry Dienes on 06/26/17    06/26/2017 Pathology Results   Diagnosis 06/26/17  1. Breast, lumpectomy, Left - INVASIVE DUCTAL CARCINOMA, GRADE III/III, SPANNING 1.2 CM. - THE SURGICAL RESECTION MARGINS ARE NEGATIVE FOR CARCINOMA. - SEE ONCOLOGY TABLE BELOW. 2. Lymph node, sentinel, biopsy, Left axillary #1 - THERE IS NO EVIDENCE OF CARCINOMA IN 1 OF 1 LYMPH NODE (0/1). 3. Lymph node, sentinel, biopsy, Left axillary #2 - THERE IS NO EVIDENCE OF CARCINOMA IN 1 OF 1 LYMPH NODE (0/1). 4. Lymph node, sentinel, biopsy, Left axillary #3 - THERE IS NO EVIDENCE OF CARCINOMA IN 1 OF 1 LYMPH NODE (0/1).    07/25/2017 - 09/29/2017 Chemotherapy   Adjuvant cytoxan and docetaxel (TC) every 3 weekd for 4 cycles     11/19/2017 - 12/17/2017 Radiation Therapy   Adjuvant breast radiation Left breast treated to 42.5 Gy with 17 fx of 2.5 Gy followed by a boost of 7.5 Gy with 3 fx of 2.5 Gy       CURRENT THERAPY: surveillance  INTERVAL HISTORY: Ms. Redler returns for follow up as scheduled. She was last seen by Dr. Burr Medico on 08/20/2019.  She presents with her daughter today, feels well.  Denies any changes in her health in the interval.  She has left low axilla/side chest wall pain that "catches" when she reaches the left arm.  This is stable since her surgery.  Denies new pain or new breast lump/mass or nipple change.  Denies any bone pain.  Appetite and weight and activity are normal for her.  Other than a recent UTI, no infections.  Denies any bleeding.  She continues to follow with GYN for uterine fibroids, no planned surgery to date.    MEDICAL HISTORY:  Past Medical History:  Diagnosis Date  . Abnormal x-ray of lungs with single pulmonary nodule 03/15/2016   per records from [place]   . Abnormal x-ray of lungs with single pulmonary nodule 03/15/2016   per records from [place]   . Anemia     . Asthma   . CHF (congestive heart failure) ([place])   . Cholelithiasis 05/19/2017   On CT  . Coronary artery calcification seen on CAT scan 05/19/2017  . Diabetes mellitus without complication ([place])    type 2  . Dilated idiopathic cardiomyopathy ([place]) 12/2015   EF 15-20%. Diagnosed in [facility]  . Headache    NONE RECENT  . Hypertension   . Malignant neoplasm of lower-outer quadrant of left breast of female, estrogen receptor negative ([place]) 06/11/2017   left breast  . Mixed hyperlipidemia 06/17/2018  . partially blind   . Personal history of chemotherapy 2018-2019  . Personal history of radiation therapy 2019  . Retinitis pigmentosa   . Solitary pulmonary nodule on lung CT 03/16/2016   per records from [place]  . Uveitis     SURGICAL HISTORY: Past Surgical History:  Procedure Laterality Date  . brain cyst removed  2007   to help with headaches per patient , aspirated   . BREAST BIOPSY Left 06/06/2017   x2  . BREAST CYST ASPIRATION Left 06/06/2017  . BREAST LUMPECTOMY Left 06/26/2017  . BREAST LUMPECTOMY WITH RADIOACTIVE SEED AND SENTINEL LYMPH NODE BIOPSY Left 06/26/2017   Procedure: BREAST LUMPECTOMY WITH RADIOACTIVE SEED AND SENTINEL LYMPH NODE BIOPSY ERAS  PATHWAY;  Surgeon: [name], MD;  Location: [facility];  Service: General;  Laterality: Left;  pec block  . FINE NEEDLE ASPIRATION Left 02/23/2019   Procedure: FINE NEEDLE ASPIRATION;  Surgeon: [name], MD;  Location: [facility];  Service: General;  Laterality: Left;  Asiration of left breast seroma   . NASAL TURBINATE REDUCTION    . PORT-A-CATH REMOVAL Right 02/23/2019   Procedure: REMOVAL PORT-A-CATH;  Surgeon: [name], MD;  Location: [facility];  Service: General;  Laterality: Right;  . PORTACATH PLACEMENT N/A 06/26/2017   Procedure: INSERTION PORT-A-CATH;  Surgeon: [name], MD;  Location: [facility];  Service: General;  Laterality: N/A;  . TRANSTHORACIC  ECHOCARDIOGRAM  12/2015   A) [facility] May 2017: Mild concentric LVH. Global hypokinesis. GR daily. EF 18% severe LA dilation. Mitral annular dilatation with papillary muscle dysfunction and moderate MR. Dilated IVC consistent with elevated RAP.  Marland Kitchen TRANSTHORACIC ECHOCARDIOGRAM  05/2017; 08/2017   a) Mild concentric LVH.  EF 45 to 50% with diffuse hypokinesis.  GR 1 DD.  Mild aortic root dilation.;; b)  Mild LVH EF 45 to 50%.  Diffuse HK.  No significant valve disease.  No significant change.    I have reviewed the social history and family history with the patient and they are unchanged from previous note.  ALLERGIES:  is allergic to latex, tylenol with codeine #3 [acetaminophen-codeine], and penicillins.  MEDICATIONS:  Current Outpatient Medications  Medication Sig Dispense Refill  . Accu-Chek FastClix Lancets MISC TEST TWICE A DAY. PT USES AN ACCU-CHEK GUIDE ME METER 102 each 2  . albuterol (PROVENTIL HFA;VENTOLIN HFA) 108 (90 Base) MCG/ACT inhaler Inhale 2 puffs into the lungs every 6 (six) hours as needed for wheezing or shortness of breath. 1 Inhaler 0  . aspirin 81  MG chewable tablet Chew 81 mg by mouth daily.    Marland Kitchen atorvastatin (LIPITOR) 20 MG tablet Take 1 tablet (20 mg total) by mouth daily. 90 tablet 1  . Blood Glucose Monitoring Suppl (ACCU-CHEK GUIDE ME) w/Device KIT 1 Package by Does not apply route 3 (three) times daily. Use as directed to check blood sugar 3 times daily Ell.9 1 kit 0  . carvedilol (COREG) 12.5 MG tablet Take 1 tablet (12.5 mg total) by mouth 2 (two) times daily with a meal. 180 tablet 1  . Cholecalciferol (D3-1000 PO) Take by mouth.    . diclofenac (VOLTAREN) 75 MG EC tablet Take 1 tablet (75 mg total) by mouth 2 (two) times daily. 60 tablet 1  . furosemide (LASIX) 40 MG tablet Take (80 mg) am and (40 ) pm X 6 days, Sundays only one tablet (40 mg ) twice daily. 160 tablet 3  . glucose blood test strip 2x daily. Pt uses accu-chek meter 100 each 2  .  insulin aspart protamine - aspart (NOVOLOG 70/30 MIX) (70-30) 100 UNIT/ML FlexPen Inject 0.2 mLs (20 Units total) into the skin 2 (two) times daily with a meal. 15 mL 11  . Insulin Pen Needle (PEN NEEDLES) 32G X 6 MM MISC Inject 1 Package into the skin 2 (two) times daily with a meal. 100 each 6  . loratadine (CLARITIN) 10 MG tablet Take 1 tablet (10 mg total) by mouth daily. 30 tablet 5  . medroxyPROGESTERone (PROVERA) 10 MG tablet     . metFORMIN (GLUCOPHAGE) 1000 MG tablet Take 1 tablet (1,000 mg total) by mouth 2 (two) times daily with a meal. 180 tablet 3  . Multiple Vitamins-Minerals (WOMENS MULTI PO) Take by mouth.    . potassium chloride SA (KLOR-CON M20) 20 MEQ tablet TAKE 20 MEQ TABLET WHEN TAKING AN EXTRA LASIX TABLET DAILY AS NEEDED 90 tablet 3  . Saccharomyces boulardii (PROBIOTIC) 250 MG CAPS Take by mouth.    . sacubitril-valsartan (ENTRESTO) 97-103 MG Take 1 tablet by mouth 2 (two) times daily. 180 tablet 3  . Semaglutide (RYBELSUS) 3 MG TABS Take 3 mg by mouth daily with breakfast. 30 tablet 3  . spironolactone (ALDACTONE) 25 MG tablet Take 1 tablet (25 mg total) by mouth daily. 90 tablet 1  . vitamin B-12 (CYANOCOBALAMIN) 500 MCG tablet Take 500 mcg by mouth daily.    . vitamin C (ASCORBIC ACID) 500 MG tablet Take 1,000 mg 2 (two) times daily by mouth.     No current facility-administered medications for this visit.    PHYSICAL EXAMINATION: ECOG PERFORMANCE STATUS: 0 - Asymptomatic  Vitals:   02/16/20 0921  BP: (!) 142/94  Pulse: 82  Resp: 18  Temp: 98.4 F (36.9 C)  SpO2: 98%   Filed Weights   02/16/20 0921  Weight: 240 lb 4.8 oz (109 kg)    GENERAL:alert, no distress and comfortable SKIN: No rash to exposed skin EYES: sclera clear NECK: Without mass LYMPH:  no palpable cervical, supraclavicular, or axillary lymphadenopathy LUNGS:  normal breathing effort HEART:  no lower extremity edema NEURO: alert & oriented x 3 with fluent speech, visual impairment at  baseline, otherwise no focal motor/sensory deficits Breast: S/p left lumpectomy, incisions completely healed.  Left lower breast seroma at the incision, stable 4-6 cm.  No other palpable mass in either breast or axilla that I could appreciate [facility]  LABORATORY DATA:  I have reviewed the data as listed CBC Latest Ref Rng &  Units 02/16/2020 08/20/2019 04/02/2019  WBC 4.0 - 10.5 K/uL 6.6 6.3 5.6  Hemoglobin 12.0 - 15.0 g/dL 11.6(L) 12.1 12.3  Hematocrit 36 - 46 % 36.1 35.9(L) 37.2  Platelets 150 - 400 K/uL 337 313 314     CMP Latest Ref Rng & Units 02/16/2020 08/20/2019 04/02/2019  Glucose 70 - 99 mg/dL 117(H) 379(H) 124(H)  BUN 8 - 23 mg/dL _0 Creatinine 0.44 - 1.00 mg/dL 1.01(H) 1.02(H) 0.79  Sodium 135 - 145 mmol/L 143 139 141  Potassium 3.5 - 5.1 mmol/L 4.2 4.1 3.9  Chloride 98 - 111 mmol/L 110 106 110  CO2 22 - 32 mmol/L _1 Calcium 8.9 - 10.3 mg/dL 9.5 9.3 9.2  Total Protein 6.5 - 8.1 g/dL 7.9 7.6 7.3  Total Bilirubin 0.3 - 1.2 mg/dL 0.5 0.4 0.4  Alkaline Phos 38 - 126 U/L 112 115 111  AST 15 - 41 U/L _2 ALT 0 - 44 U/L _3 RADIOGRAPHIC STUDIES: I have personally reviewed the radiological images as listed and agreed with the findings in the report. No results found.   ASSESSMENT & PLAN: Neomia Herbel Bowensis a 64 y.o.femalewith a history of   1. Malignant neoplasm of lower-outer quadrant of left breast, invasive ductal carcinoma, stage IB, pT1c, N0, M0, Triple negative, Grade 3 -diagnosed in 05/2017, s/p left breast lumpectomy, adjuvant TC, and radiation -Genetic testing was negative for pathogenic mutation -Currently on breast cancer surveillance -Annual mammograms in October show stable left breast postop seroma -Ms. Bowen's is clinically doing well.  She has intermittent but stable discomfort on the left side with reaching, no new concerning symptoms. Breast exam shows stable left seroma.  Labs stable overall.  No clinical  concern for recurrence -I offered to refer her back to Dr. Barry Dienes to discuss draining the seroma, she declined for now and will monitor it. -Continue breast cancer surveillance, annual mammogram due 05/2020.  I placed the order today. -She is nearly 3 years from her diagnosis, we reviewed the recurrence risk is decreased after 3 years. -She will return for routine surveillance follow-up in 6 months  2. Anemia -She developed anemia during adjuvant chemotherapy, which resolved after she completed chemo -She had recurrent mild anemia with Hgb 11.7 in 01/2019  -Labs on 04/02/2019 showed normal iron, ferritin, folate, and MMA -Anemia has remained mild and intermittent in the interim.  Hgb 11.6 today.  No other cytopenias or abnormalities -Her mild anemia could be related to recent UTI vs early anemia of chronic disease.  -We will repeat iron studies at next visit, and monitor closely  3. HTN, HL, DM, COPD, retinitis pigmentosa with limited vision -On albuterol, aspirin, atorvastatin, insulin  -she is legally blind, daughter accompanies her and she uses [employer] transportation -A1c on 10/2019 of 10.6, followed by endocrine with med regimen adjustments -BG today 117  4.  Nonischemic cardiomyopathy, chronic combined systolic and diastolic heart failure, EF 45-50% -On furosemide, carvedilol, sacubitril-valsartan, aspirin -Continue follow-up with cardiology, Dr. Ellyn Hack  5.  Uterine fibroids, postmenopausal -LMP 2015 -Followed by OB/GYN, no planned surgery at this time -Denies recent vaginal bleeding  6.   Health maintenance, cancer screenings -denies recent GI bleeding  -Last colonoscopy 06/09/2018 showed large internal and external hemorrhoids, 5-year recall -Followed by OB/GYN   PLAN: -Labs reviewed -Continue breast cancer surveillance -Repeat iron and vit D at next visit  -Annual mammogram due 05/2020, order placed today -Continue f/u  with PCP, Gyn, GI, endo -Lab and f/u with Dr. Burr Medico  in 6 months    Orders Placed This Encounter  Procedures  . MM DIAG BREAST TOMO BILATERAL    Standing Status:   Future    Standing Expiration Date:   02/15/2021    Order Specific Question:   Reason for Exam (SYMPTOM  OR DIAGNOSIS REQUIRED)    Answer:   History of left breast cancer, triple negative s/p lumpectomy, chemo, radiation. known seroma    Order Specific Question:   Preferred imaging location?    Answer:   [facility]  . Iron and TIBC    Standing Status:   Future    Standing Expiration Date:   02/15/2021  . Ferritin    Standing Status:   Future    Standing Expiration Date:   02/15/2021  . Vitamin D 25 hydroxy    Standing Status:   Future    Standing Expiration Date:   02/15/2021   All questions were answered. The patient knows to call the clinic with any problems, questions or concerns. No barriers to learning was detected. Total encounter time was 30 minutes.     Alla Feeling, NP 02/16/20

## 2020-02-16 ENCOUNTER — Telehealth: Payer: Self-pay | Admitting: Hematology

## 2020-02-16 ENCOUNTER — Encounter: Payer: Self-pay | Admitting: Nurse Practitioner

## 2020-02-16 ENCOUNTER — Inpatient Hospital Stay: Payer: Medicare HMO | Attending: Nurse Practitioner | Admitting: Nurse Practitioner

## 2020-02-16 ENCOUNTER — Other Ambulatory Visit: Payer: Self-pay

## 2020-02-16 ENCOUNTER — Inpatient Hospital Stay: Payer: Medicare HMO

## 2020-02-16 VITALS — BP 142/94 | HR 82 | Temp 98.4°F | Resp 18 | Ht 67.0 in | Wt 240.3 lb

## 2020-02-16 DIAGNOSIS — E782 Mixed hyperlipidemia: Secondary | ICD-10-CM | POA: Diagnosis not present

## 2020-02-16 DIAGNOSIS — C50512 Malignant neoplasm of lower-outer quadrant of left female breast: Secondary | ICD-10-CM | POA: Diagnosis not present

## 2020-02-16 DIAGNOSIS — H548 Legal blindness, as defined in USA: Secondary | ICD-10-CM | POA: Insufficient documentation

## 2020-02-16 DIAGNOSIS — Z886 Allergy status to analgesic agent status: Secondary | ICD-10-CM | POA: Diagnosis not present

## 2020-02-16 DIAGNOSIS — D259 Leiomyoma of uterus, unspecified: Secondary | ICD-10-CM | POA: Insufficient documentation

## 2020-02-16 DIAGNOSIS — I420 Dilated cardiomyopathy: Secondary | ICD-10-CM | POA: Diagnosis not present

## 2020-02-16 DIAGNOSIS — Z885 Allergy status to narcotic agent status: Secondary | ICD-10-CM | POA: Diagnosis not present

## 2020-02-16 DIAGNOSIS — Z171 Estrogen receptor negative status [ER-]: Secondary | ICD-10-CM | POA: Diagnosis not present

## 2020-02-16 DIAGNOSIS — J449 Chronic obstructive pulmonary disease, unspecified: Secondary | ICD-10-CM | POA: Insufficient documentation

## 2020-02-16 DIAGNOSIS — K644 Residual hemorrhoidal skin tags: Secondary | ICD-10-CM | POA: Insufficient documentation

## 2020-02-16 DIAGNOSIS — E119 Type 2 diabetes mellitus without complications: Secondary | ICD-10-CM | POA: Diagnosis not present

## 2020-02-16 DIAGNOSIS — K648 Other hemorrhoids: Secondary | ICD-10-CM | POA: Diagnosis not present

## 2020-02-16 DIAGNOSIS — D649 Anemia, unspecified: Secondary | ICD-10-CM | POA: Insufficient documentation

## 2020-02-16 DIAGNOSIS — Z880 Allergy status to penicillin: Secondary | ICD-10-CM | POA: Diagnosis not present

## 2020-02-16 DIAGNOSIS — I110 Hypertensive heart disease with heart failure: Secondary | ICD-10-CM | POA: Diagnosis not present

## 2020-02-16 DIAGNOSIS — Z9221 Personal history of antineoplastic chemotherapy: Secondary | ICD-10-CM | POA: Insufficient documentation

## 2020-02-16 DIAGNOSIS — I5042 Chronic combined systolic (congestive) and diastolic (congestive) heart failure: Secondary | ICD-10-CM | POA: Diagnosis not present

## 2020-02-16 DIAGNOSIS — E559 Vitamin D deficiency, unspecified: Secondary | ICD-10-CM

## 2020-02-16 DIAGNOSIS — Z923 Personal history of irradiation: Secondary | ICD-10-CM | POA: Insufficient documentation

## 2020-02-16 DIAGNOSIS — R0789 Other chest pain: Secondary | ICD-10-CM | POA: Insufficient documentation

## 2020-02-16 DIAGNOSIS — Z79899 Other long term (current) drug therapy: Secondary | ICD-10-CM | POA: Insufficient documentation

## 2020-02-16 DIAGNOSIS — I2510 Atherosclerotic heart disease of native coronary artery without angina pectoris: Secondary | ICD-10-CM | POA: Insufficient documentation

## 2020-02-16 LAB — CBC WITH DIFFERENTIAL/PLATELET
Abs Immature Granulocytes: 0.04 10*3/uL (ref 0.00–0.07)
Basophils Absolute: 0 10*3/uL (ref 0.0–0.1)
Basophils Relative: 0 %
Eosinophils Absolute: 0.2 10*3/uL (ref 0.0–0.5)
Eosinophils Relative: 2 %
HCT: 36.1 % (ref 36.0–46.0)
Hemoglobin: 11.6 g/dL — ABNORMAL LOW (ref 12.0–15.0)
Immature Granulocytes: 1 %
Lymphocytes Relative: 29 %
Lymphs Abs: 1.9 10*3/uL (ref 0.7–4.0)
MCH: 28.7 pg (ref 26.0–34.0)
MCHC: 32.1 g/dL (ref 30.0–36.0)
MCV: 89.4 fL (ref 80.0–100.0)
Monocytes Absolute: 0.5 10*3/uL (ref 0.1–1.0)
Monocytes Relative: 8 %
Neutro Abs: 4 10*3/uL (ref 1.7–7.7)
Neutrophils Relative %: 60 %
Platelets: 337 10*3/uL (ref 150–400)
RBC: 4.04 MIL/uL (ref 3.87–5.11)
RDW: 13.1 % (ref 11.5–15.5)
WBC: 6.6 10*3/uL (ref 4.0–10.5)
nRBC: 0 % (ref 0.0–0.2)

## 2020-02-16 LAB — COMPREHENSIVE METABOLIC PANEL
ALT: 22 U/L (ref 0–44)
AST: 27 U/L (ref 15–41)
Albumin: 3.8 g/dL (ref 3.5–5.0)
Alkaline Phosphatase: 112 U/L (ref 38–126)
Anion gap: 9 (ref 5–15)
BUN: 18 mg/dL (ref 8–23)
CO2: 24 mmol/L (ref 22–32)
Calcium: 9.5 mg/dL (ref 8.9–10.3)
Chloride: 110 mmol/L (ref 98–111)
Creatinine, Ser: 1.01 mg/dL — ABNORMAL HIGH (ref 0.44–1.00)
GFR calc Af Amer: >60 mL/min (ref >60)
GFR calc non Af Amer: >60 mL/min (ref >60)
Glucose, Bld: 117 mg/dL — ABNORMAL HIGH (ref 70–99)
Potassium: 4.2 mmol/L (ref 3.5–5.1)
Sodium: 143 mmol/L (ref 135–145)
Total Bilirubin: 0.5 mg/dL (ref 0.3–1.2)
Total Protein: 7.9 g/dL (ref 6.5–8.1)

## 2020-02-16 NOTE — Telephone Encounter (Signed)
Scheduled per 07/07 los, patient will be notified per My chart.

## 2020-03-15 ENCOUNTER — Telehealth: Payer: Self-pay | Admitting: Internal Medicine

## 2020-03-15 ENCOUNTER — Other Ambulatory Visit: Payer: Self-pay

## 2020-03-15 DIAGNOSIS — E119 Type 2 diabetes mellitus without complications: Secondary | ICD-10-CM

## 2020-03-15 MED ORDER — METFORMIN HCL 1000 MG PO TABS: 1000.0000 mg | ORAL_TABLET | Freq: Two times a day (BID) | ORAL | 3 refills | Status: DC

## 2020-03-15 NOTE — Telephone Encounter (Signed)
Medication Refill Request  Did you call your pharmacy and request this refill first? Yes . If patient has not contacted pharmacy first, instruct them to do so for future refills.  . Remind them that contacting the pharmacy for their refill is the quickest method to get the refill.  . Refill policy also stated that it will take anywhere between 24-72 hours to receive the refill.    Name of medication?  metFORMIN (GLUCOPHAGE) 1000 MG tablet  Is this a 90 day supply? Yes  Name and location of pharmacy? [facility], [place] Phone:  [phone]  Fax:  [phone]       . Is the request for diabetes test strips? No . If yes, what brand? N/A

## 2020-03-15 NOTE — Telephone Encounter (Signed)
sent 

## 2020-04-04 NOTE — Progress Notes (Signed)
° °  Subjective:    Patient ID: Kristin Ward, female    DOB: October 02, 1955, 64 y.o.   MRN: [id number]  HPI Chief Complaint  Patient presents with   med check    med check- having hip pain   She is here to follow up on chronic health conditions and for a urine recheck due to recent infection and hematuria.   States she did not have any urinary symptoms at that time. Denies any urinary symptoms today.   Reports taking her statin daily without any issues.  Taking a vitamin D supplement  She is followed by several specialists.  Has appt in September with endocrinologist for her diabetes.   States she is a vegetarian now.  She tries to avoid gluten because she thinks it flares up hip pain.  No recent exercise  Right hip pain that flares up from time to time. Does not have the pain for days or weeks.  Taking oral diclofenac prn.  I referred her to Ortho care almost 1 year ago but she did not go.  She is interested in being referred to them now.  States she is not willing to get a Covid vaccine.  She declines pneumonia vaccine as well   Other doctors caring for patient include: Dr. Ellyn Hack- cardiology Dr. Kelton Pillar- Endo Dr. Earle Gell- GI Dr. Burr Medico- oncology Dr. Johnette Abraham. [place] - OBGYN Dr. Zadie Rhine- retina specialist Dr. Katy Fitch- eyes      Review of Systems Pertinent positives and negatives in the history of present illness.     Objective:   Physical Exam BP 130/90    Pulse 78    Wt 234 lb 12.8 oz (106.5 kg)    BMI 36.77 kg/m   Alert and oriented and in no acute distress. Cardiac exam shows a regular sinus rhythm without murmurs or gallops.        Assessment & Plan:  Mixed hyperlipidemia -Continue statin therapy.  Reviewed recent lipid panel and we did not repeat this today  Vitamin D deficiency -Continue vitamin D supplement.   Essential hypertension -She is aware that her diastolic pressure is elevated today.  Recommend she keep a close eye on her blood pressure at  home.  Continue current medications and follow-up with cardiology.  Eat a low-sodium diet  Chronic right hip pain - Plan: Ambulatory referral to Orthopedic Surgery -This has been intermittent and ongoing for over a year.  She is ready to see an orthopedist.  I will refer her  Osteoarthritis of lumbar spine, unspecified spinal osteoarthritis complication status - Plan: Ambulatory referral to Orthopedic Surgery  Proteinuria, unspecified type - Plan: POCT Urinalysis DIP (Proadvantage Device) -Urinalysis dipstick-protein level improved  Hematuria, unspecified type - Plan: POCT Urinalysis DIP (Proadvantage Device) -Urinalysis dipstick negative for blood  Obesity (BMI 30-39.9) -Counseling on healthy diet and increasing physical activity as tolerated

## 2020-04-05 ENCOUNTER — Ambulatory Visit (INDEPENDENT_AMBULATORY_CARE_PROVIDER_SITE_OTHER): Payer: Medicare HMO | Admitting: Family Medicine

## 2020-04-05 ENCOUNTER — Other Ambulatory Visit: Payer: Self-pay

## 2020-04-05 ENCOUNTER — Encounter: Payer: Self-pay | Admitting: Family Medicine

## 2020-04-05 VITALS — BP 130/90 | HR 78 | Wt 234.8 lb

## 2020-04-05 DIAGNOSIS — R809 Proteinuria, unspecified: Secondary | ICD-10-CM

## 2020-04-05 DIAGNOSIS — G8929 Other chronic pain: Secondary | ICD-10-CM

## 2020-04-05 DIAGNOSIS — R319 Hematuria, unspecified: Secondary | ICD-10-CM

## 2020-04-05 DIAGNOSIS — E782 Mixed hyperlipidemia: Secondary | ICD-10-CM | POA: Diagnosis not present

## 2020-04-05 DIAGNOSIS — E669 Obesity, unspecified: Secondary | ICD-10-CM

## 2020-04-05 DIAGNOSIS — I10 Essential (primary) hypertension: Secondary | ICD-10-CM

## 2020-04-05 DIAGNOSIS — M25551 Pain in right hip: Secondary | ICD-10-CM | POA: Diagnosis not present

## 2020-04-05 DIAGNOSIS — E559 Vitamin D deficiency, unspecified: Secondary | ICD-10-CM | POA: Diagnosis not present

## 2020-04-05 DIAGNOSIS — M47816 Spondylosis without myelopathy or radiculopathy, lumbar region: Secondary | ICD-10-CM | POA: Diagnosis not present

## 2020-04-05 LAB — POCT URINALYSIS DIP (PROADVANTAGE DEVICE)
Bilirubin, UA: NEGATIVE
Blood, UA: NEGATIVE
Glucose, UA: NEGATIVE mg/dL
Ketones, POC UA: NEGATIVE mg/dL
Leukocytes, UA: NEGATIVE
Nitrite, UA: NEGATIVE
Specific Gravity, Urine: 1.03
Urobilinogen, Ur: NEGATIVE
pH, UA: 6 (ref 5.0–8.0)

## 2020-04-13 ENCOUNTER — Ambulatory Visit (INDEPENDENT_AMBULATORY_CARE_PROVIDER_SITE_OTHER): Payer: Medicare HMO

## 2020-04-13 ENCOUNTER — Ambulatory Visit: Payer: Self-pay

## 2020-04-13 ENCOUNTER — Ambulatory Visit (INDEPENDENT_AMBULATORY_CARE_PROVIDER_SITE_OTHER): Payer: Medicare HMO | Admitting: Orthopaedic Surgery

## 2020-04-13 ENCOUNTER — Encounter: Payer: Self-pay | Admitting: Orthopaedic Surgery

## 2020-04-13 VITALS — Ht 68.0 in | Wt 234.0 lb

## 2020-04-13 DIAGNOSIS — M25551 Pain in right hip: Secondary | ICD-10-CM

## 2020-04-13 NOTE — Progress Notes (Signed)
Subjective: Patient is here for ultrasound-guided intra-articular right hip injection.   Pain from DJD.  Objective:  Pain with IR.  Procedure: Ultrasound-guided right hip injection: After sterile prep with Betadine, injected 8 cc 1% lidocaine without epinephrine and 40 mg methylprednisolone using a 22-gauge spinal needle, passing the needle through the iliofemoral ligament into the femoral head/neck junction.  Injectate seen filling the joint capsule.  Good immediate relief.  Follow-up as directed.

## 2020-04-13 NOTE — Progress Notes (Signed)
Office Visit Note   Patient: Kristin Ward           Date of Birth: 01/15/1956           MRN: [id number] Visit Date: 04/13/2020              Requested by: Girtha Rm, NP-C [place],  [place] [address] PCP: Girtha Rm, NP-C   Assessment & Plan: Visit Diagnoses:  1. Pain in right hip     Plan: Impression is right hip pain.  I feel that this is coming from her right hip joint therefore like to try an intra-articular cortisone injection today.  We have set her up with Dr. Junius Roads to have this done under ultrasound.  Patient instructed to follow-up in about 4 to 6 weeks if she does not notice any relief.  Follow-Up Instructions: Return if symptoms worsen or fail to improve.   Orders:  Orders Placed This Encounter  Procedures   XR HIP UNILAT W OR W/O PELVIS 2-3 VIEWS RIGHT   No orders of the defined types were placed in this encounter.     Procedures: No procedures performed   Clinical Data: No additional findings.   Subjective: Chief Complaint  Patient presents with   Right Hip - Pain    Kristin Ward is a 64 year old female comes in with her daughter for evaluation of chronic right hip pain for 2 years.  Denies any injuries.  She ambulates with a cane.  Denies any groin pain.  She localizes the pain to the superior lateral portion of the right hip area.  Denies any radicular symptoms.  Has been taking diclofenac and Tylenol with temporary relief.  Denies any numbness and tingling.   Review of Systems  Constitutional: Negative.   HENT: Negative.   Eyes: Negative.   Respiratory: Negative.   Cardiovascular: Negative.   Endocrine: Negative.   Musculoskeletal: Negative.   Neurological: Negative.   Hematological: Negative.   Psychiatric/Behavioral: Negative.   All other systems reviewed and are negative.    Objective: Vital Signs: Ht 5\' 8"  (1.727 m)    Wt 234 lb (106.1 kg)    BMI 35.58 kg/m   Physical Exam Vitals and nursing note  reviewed.  Constitutional:      Appearance: She is well-developed.  HENT:     Head: Normocephalic and atraumatic.  Pulmonary:     Effort: Pulmonary effort is normal.  Abdominal:     Palpations: Abdomen is soft.  Musculoskeletal:     Cervical back: Neck supple.  Skin:    General: Skin is warm.     Capillary Refill: Capillary refill takes less than 2 seconds.  Neurological:     Mental Status: She is alert and oriented to person, place, and time.  Psychiatric:        Behavior: Behavior normal.        Thought Content: Thought content normal.        Judgment: Judgment normal.     Ortho Exam Right hip shows painful internal and external rotation that localizes to the superior lateral region of the hip.  Pain with Stinchfield testing to the same region.  No sciatic tension signs.  Trochanteric bursa is nontender.  Negative logroll.  Lumbar spine is nontender. Specialty Comments:  No specialty comments available.  Imaging: XR HIP UNILAT W OR W/O PELVIS 2-3 VIEWS RIGHT  Result Date: 04/13/2020 Mild right hip osteoarthritis with spurring of the acetabulum.    PMFS History: Patient  Active Problem List   Diagnosis Date Noted   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   Chronic right hip pain 04/05/2020   Type 2 diabetes mellitus with hyperglycemia, without long-term current use of insulin ([place]) 10/21/2019   Vitamin D deficiency 10/07/2019   Elevated serum creatinine 10/07/2019   Diabetes mellitus, new onset ([place]) 01/20/2019   Mixed hyperlipidemia 06/17/2018   Influenza vaccination declined 06/17/2018   Obesity (BMI 30-39.9) 06/17/2018   Port-A-Cath in place 09/08/2017   Other fatigue 08/29/2017   Anemia 08/29/2017   High risk medication use 08/29/2017   Syncope 08/25/2017   Blind in both eyes 08/25/2017   Genetic testing 06/19/2017   Family history of colon cancer    Malignant neoplasm of lower-outer quadrant of left breast of female, estrogen receptor  negative ([place]) 06/11/2017   Cholelithiasis 05/19/2017   Coronary artery calcification seen on CAT scan 05/19/2017   Low grade squamous intraepith lesion on cytologic smear cervix ([place]) 04/09/2017   HPV (human papilloma virus) infection 04/09/2017   COPD (chronic obstructive pulmonary disease) ([place])    Essential hypertension 03/20/2017   Screen for colon cancer 03/20/2017   Retinitis pigmentosa    Blind    Solitary pulmonary nodule on lung CT 03/16/2016   Abnormal myocardial perfusion study 03/15/2016   Chronic combined systolic and diastolic heart failure ([place]) 12/28/2015   Dilated cardiomyopathy ([place]) 12/27/2015   Past Medical History:  Diagnosis Date   Abnormal x-ray of lungs with single pulmonary nodule 03/15/2016   per records from [place]    Abnormal x-ray of lungs with single pulmonary nodule 03/15/2016   per records from [place]    Anemia    Asthma    CHF (congestive heart failure) ([place])    Cholelithiasis 05/19/2017   On CT   Coronary artery calcification seen on CAT scan 05/19/2017   Diabetes mellitus without complication ([place])    type 2   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 of female, estrogen receptor negative ([place]) 06/11/2017   left breast   Mixed hyperlipidemia 06/17/2018   partially blind    Personal history of chemotherapy 2018-2019   Personal history of radiation therapy 2019   Retinitis pigmentosa    Solitary pulmonary nodule on lung CT 03/16/2016   per records from [place]   Uveitis     Family History  Problem Relation Age of Onset   Colon cancer Mother 22       deceased 66   Heart attack Father    Breast cancer Other 96       2nd cousin on maternal side; currently 71   Pancreatic cancer Other        2nd cousin once removed on maternal side;  deceased 74s   Fibromyalgia Sister    Esophageal cancer Neg Hx    Rectal cancer Neg Hx    Stomach cancer Neg Hx     Past Surgical History:  Procedure Laterality Date   brain cyst removed  2007   to help with headaches per patient , aspirated    BREAST BIOPSY Left 06/06/2017   x2   BREAST CYST ASPIRATION Left 06/06/2017   BREAST LUMPECTOMY Left 06/26/2017   BREAST LUMPECTOMY WITH RADIOACTIVE SEED AND SENTINEL LYMPH NODE BIOPSY Left 06/26/2017   Procedure: BREAST LUMPECTOMY WITH RADIOACTIVE SEED AND SENTINEL LYMPH NODE BIOPSY ERAS  PATHWAY;  Surgeon: [name], MD;  Location: [facility];  Service: General;  Laterality: Left;  pec block   FINE NEEDLE ASPIRATION Left 02/23/2019   Procedure: FINE NEEDLE ASPIRATION;  Surgeon: [name], MD;  Location: [facility];  Service: General;  Laterality: Left;  Asiration of left breast seroma    NASAL TURBINATE REDUCTION     PORT-A-CATH REMOVAL Right 02/23/2019   Procedure: REMOVAL PORT-A-CATH;  Surgeon: [name], MD;  Location: [facility];  Service: General;  Laterality: Right;   PORTACATH PLACEMENT N/A 06/26/2017   Procedure: INSERTION PORT-A-CATH;  Surgeon: [name], MD;  Location: [facility];  Service: General;  Laterality: N/A;   TRANSTHORACIC ECHOCARDIOGRAM  12/2015   A) [facility] May 2017: Mild concentric LVH. Global hypokinesis. GR daily. EF 18% severe LA dilation. Mitral annular dilatation with papillary muscle dysfunction and moderate MR. Dilated IVC consistent with elevated RAP.   TRANSTHORACIC ECHOCARDIOGRAM  05/2017; 08/2017   a) Mild concentric LVH.  EF 45 to 50% with diffuse hypokinesis.  GR 1 DD.  Mild aortic root dilation.;; b)  Mild LVH EF 45 to 50%.  Diffuse HK.  No significant valve disease.  No significant change.   Social History   Occupational History   Not on file  Tobacco Use   Smoking status: Former Smoker    Packs/day: 0.25    Years: 23.00    Pack years:  5.75    Types: Cigarettes    Quit date: 10/09/2015    Years since quitting: 4.5   Smokeless tobacco: Never Used  Vaping Use   Vaping Use: Never used  Substance and Sexual Activity   Alcohol use: No   Drug use: No   Sexual activity: Never

## 2020-04-19 NOTE — Progress Notes (Signed)
Name: Kristin Ward  Age/ Sex: 64 y.o., female   MRN/ DOB: [id number], June 11, 1956     PCP: Girtha Rm, NP-C   Reason for Endocrinology Evaluation: Type 2 Diabetes Mellitus  Initial Endocrine Consultative Visit: 01/27/19    PATIENT IDENTIFIER: Kristin Ward is a 64 y.o. female with a past medical history of Retinitis Pigmentosa, T2DM, HTN,macular degeneration and CHF. The patient has followed with Endocrinology clinic since 01/27/19 for consultative assistance with management of her diabetes.  DIABETIC HISTORY:  Kristin Ward was diagnosed with T2 DM in 12/2018, she presented with symptomatic hyperglycemia. She was started on Metformin, Basaglar and Tradjenta. Her hemoglobin A1c was 13.0% on initial diagnosis.   Novolog Mix started 01/2019    Rybelsus started 10/2019 SUBJECTIVE:   During the last visit (10/20/2019): A1c 10.6 %. Started Rybelsus and continued metformin and decreased insulin mix     Today (04/20/2020): Kristin Ward is here for a  follow up on diabetes.  She canceled her 3 month follow up back in 01/2020. She checks her blood sugars 2 times daily, preprandial to breakfast and evening . The patient has not had hypoglycemic episodes since the last clinic visit.    She has right chronic hip pain, had intra articular injection through Orthopedist but she believes its related to neuropathic pain Gabapentin helps with hip pain  ED visit for UTI in 01/2020    HOME DIABETES REGIMEN:  Metformin 1000 mg BID Novolog Mix 20 units BID with meals- stopped it in March, 2021 Rybelsus 3 mg daily    METER DOWNLOAD SUMMARY: 8/27-04/20/2020 Fingerstick Blood Glucose Tests = 15 Average Number Tests/Day = 0.4 Overall Mean FS Glucose = 119   BG Ranges: Low = 101 High = 148  Hypoglycemic Events/30 Days: BG < 50 = 0 Episodes of symptomatic severe hypoglycemia = 0      HISTORY:  Past Medical History:  Past Medical History:  Diagnosis Date  . Abnormal  x-ray of lungs with single pulmonary nodule 03/15/2016   per records from [place]   . Abnormal x-ray of lungs with single pulmonary nodule 03/15/2016   per records from [place]   . Anemia   . Asthma   . CHF (congestive heart failure) ([place])   . Cholelithiasis 05/19/2017   On CT  . Coronary artery calcification seen on CAT scan 05/19/2017  . Diabetes mellitus without complication ([place])    type 2  . 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 Diagnosed in [facility]  . Headache    NONE RECENT  . Hypertension   . Malignant neoplasm of lower-outer quadrant of left breast of female, estrogen receptor negative ([place]) 06/11/2017   left breast  . Mixed hyperlipidemia 06/17/2018  . partially blind   . Personal history of chemotherapy 2018-2019  . Personal history of radiation therapy 2019  . Retinitis pigmentosa   . Solitary pulmonary nodule on lung CT 03/16/2016   per records from [place]  . Uveitis    Past Surgical History:  Past Surgical History:  Procedure Laterality Date  . brain cyst removed  2007   to help with headaches per patient , aspirated   . BREAST BIOPSY Left 06/06/2017   x2  . BREAST CYST ASPIRATION Left 06/06/2017  . BREAST LUMPECTOMY Left 06/26/2017  . BREAST LUMPECTOMY WITH RADIOACTIVE SEED AND SENTINEL LYMPH NODE BIOPSY Left 06/26/2017   Procedure: BREAST LUMPECTOMY WITH RADIOACTIVE SEED AND SENTINEL LYMPH NODE BIOPSY ERAS  PATHWAY;  Surgeon:  [name], MD;  Location: [facility];  Service: General;  Laterality: Left;  pec block  . FINE NEEDLE ASPIRATION Left 02/23/2019   Procedure: FINE NEEDLE ASPIRATION;  Surgeon: [name], MD;  Location: [facility];  Service: General;  Laterality: Left;  Asiration of left breast seroma   . NASAL TURBINATE REDUCTION    . PORT-A-CATH REMOVAL Right 02/23/2019   Procedure: REMOVAL PORT-A-CATH;  Surgeon: [name], MD;  Location: [facility];  Service: General;   Laterality: Right;  . PORTACATH PLACEMENT N/A 06/26/2017   Procedure: INSERTION PORT-A-CATH;  Surgeon: [name], MD;  Location: [facility];  Service: General;  Laterality: N/A;  . TRANSTHORACIC ECHOCARDIOGRAM  12/2015   A) [facility] May 2017: Mild concentric LVH. Global hypokinesis. GR daily. EF 18% severe LA dilation. Mitral annular dilatation with papillary muscle dysfunction and moderate MR. Dilated IVC consistent with elevated RAP.  [facility] TRANSTHORACIC ECHOCARDIOGRAM  05/2017; 08/2017   a) Mild concentric LVH.  EF 45 to 50% with diffuse hypokinesis.  GR 1 DD.  Mild aortic root dilation.;; b)  Mild LVH EF 45 to 50%.  Diffuse HK.  No significant valve disease.  No significant change.    Social History:  reports that she quit smoking about 4 years ago. Her smoking use included cigarettes. She has a 5.75 pack-year smoking history. She has never used smokeless tobacco. She reports that she does not drink alcohol and does not use drugs. Family History:  Family History  Problem Relation Age of Onset  . Colon cancer Mother 57       deceased 49  . Heart attack Father   . Breast cancer Other 33       2nd cousin on maternal side; currently 40  . Pancreatic cancer Other        2nd cousin once removed on maternal side; deceased 33s  . Fibromyalgia Sister   . Esophageal cancer Neg Hx   . Rectal cancer Neg Hx   . Stomach cancer Neg Hx      HOME MEDICATIONS: Allergies as of 04/20/2020      Reactions   Latex Hives, Itching   Burning    Tylenol With Codeine #3 [acetaminophen-codeine] Nausea And Vomiting   Penicillins Nausea Only, Swelling, Rash   Has patient had a PCN reaction causing immediate rash, facial/tongue/throat swelling, SOB or lightheadedness with hypotension: Yes Has patient had a PCN reaction causing severe rash involving mucus membranes or skin necrosis: Yes Has patient had a PCN reaction that required hospitalization: No Has patient had a PCN reaction occurring within the  last 10 years: No TONGUE SWELLING AND RASH AROUND MOUTH If all of the above answers are "NO", then may proceed with Cephalosporin use.      Medication List       Accurate as of April 20, 2020  9:47 AM. If you have any questions, ask your nurse or doctor.        Accu-Chek FastClix Lancets Misc TEST TWICE A DAY. PT USES AN ACCU-CHEK GUIDE ME METER   Accu-Chek Guide Me w/Device Kit 1 Package by Does not apply route 3 (three) times daily. Use as directed to check blood sugar 3 times daily Ell.9   albuterol 108 (90 Base) MCG/ACT inhaler Commonly known as: VENTOLIN HFA Inhale 2 puffs into the lungs every 6 (six) hours as needed for wheezing or shortness of breath.   aspirin 81 MG chewable tablet Chew 81 mg by mouth daily.   atorvastatin  20 MG tablet Commonly known as: LIPITOR Take 1 tablet (20 mg total) by mouth daily.   carvedilol 12.5 MG tablet Commonly known as: COREG Take 1 tablet (12.5 mg total) by mouth 2 (two) times daily with a meal.   D3-1000 PO Take by mouth.   diclofenac 75 MG EC tablet Commonly known as: VOLTAREN Take 1 tablet (75 mg total) by mouth 2 (two) times daily.   furosemide 40 MG tablet Commonly known as: LASIX Take (80 mg) am and (40 ) pm X 6 days, Sundays only one tablet (40 mg ) twice daily.   glucose blood test strip 2x daily. Pt uses accu-chek meter   insulin aspart protamine - aspart (70-30) 100 UNIT/ML FlexPen Commonly known as: NOVOLOG 70/30 MIX Inject 0.2 mLs (20 Units total) into the skin 2 (two) times daily with a meal.   loratadine 10 MG tablet Commonly known as: CLARITIN Take 1 tablet (10 mg total) by mouth daily.   metFORMIN 1000 MG tablet Commonly known as: GLUCOPHAGE Take 1 tablet (1,000 mg total) by mouth 2 (two) times daily with a meal.   Pen Needles 32G X 6 MM Misc Inject 1 Package into the skin 2 (two) times daily with a meal.   potassium chloride SA 20 MEQ tablet Commonly known as: Klor-Con M20 TAKE 20 MEQ TABLET  WHEN TAKING AN EXTRA LASIX TABLET DAILY AS NEEDED   Probiotic 250 MG Caps Take by mouth.   Rybelsus 3 MG Tabs Generic drug: Semaglutide Take 3 mg by mouth daily with breakfast.   sacubitril-valsartan 97-103 MG Commonly known as: ENTRESTO Take 1 tablet by mouth 2 (two) times daily.   spironolactone 25 MG tablet Commonly known as: ALDACTONE Take 1 tablet (25 mg total) by mouth daily.   vitamin B-12 500 MCG tablet Commonly known as: CYANOCOBALAMIN Take 500 mcg by mouth daily.   vitamin C 500 MG tablet Commonly known as: ASCORBIC ACID Take 1,000 mg 2 (two) times daily by mouth.   WOMENS MULTI PO Take by mouth.        OBJECTIVE:   Vital Signs: BP 124/80 (BP Location: Left Arm, Patient Position: Sitting, Cuff Size: Normal)   Pulse 76   Ht '5\' 8"'  (1.727 m)   Wt 234 lb 6.4 oz (106.3 kg)   SpO2 94%   BMI 35.64 kg/m   Wt Readings from Last 3 Encounters:  04/20/20 234 lb 6.4 oz (106.3 kg)  04/13/20 234 lb (106.1 kg)  04/05/20 234 lb 12.8 oz (106.5 kg)     Exam: General: Pt appears well and is in NAD  Lungs: Clear with good BS bilat with no rales, rhonchi, or wheezes  Heart: RRR with normal S1 and S2 and no gallops; no murmurs; no rub  Abdomen: Normoactive bowel sounds, soft, nontender, without masses or organomegaly palpable  Extremities: No pretibial edema. No tremor.   Skin: Normal texture and temperature to palpation. No rash noted. No Acanthosis nigricans/skin tags. No lipohypertrophy.  Neuro: MS is good with appropriate affect, pt is alert and Ox3        DM foot exam: 10/20/2019  The skin of the feet is intact without sores or ulcerations. The pedal pulses are 2+ on right and 2+ on left. The sensation is intact to a screening 5.07, 10 gram monofilament bilaterally   DATA REVIEWED:  Lab Results  Component Value Date   HGBA1C 5.6 04/20/2020   HGBA1C 10.6 (A) 10/20/2019   HGBA1C 6.7 (A) 06/18/2019   Lab Results  Component Value Date   LDLCALC 66  01/06/2020   CREATININE 1.01 (H) 02/16/2020     Lab Results  Component Value Date   CHOL 114 01/06/2020   HDL 32 (L) 01/06/2020   LDLCALC 66 01/06/2020   TRIG 81 01/06/2020   CHOLHDL 3.6 01/06/2020         ASSESSMENT / PLAN / RECOMMENDATIONS:   1) Type 2 Diabetes Mellitus, Optimally  Controlled, Without complications - Most recent A1c of 5.6  %. Goal A1c < 7.0 %.    - Pt's A1c is optimal without hypoglycemia. She was initially on insulin and due to hypoglycemia she stopped it.  - Pt has a hx of fluctuating A1c ranging from 6.7 % able to get off insulin but returned with an A1c 10.6% due to inconsistency with lifestyle changes.  - I have advised her to reach out to [place] in the future with concerns rather than self discontinuing medications. - I am not going to make any changes at this time, I will see her after the holidays. If A1c continues to be optimal, will adjust medications  MEDICATIONS:  Continue Metformin 1000 mg BID   Continue Rybelsus 3 mg daily with Breakfast    EDUCATION / INSTRUCTIONS:  BG monitoring instructions: Patient is instructed to check her blood sugars 2 times a day, fasting and supper.  Call [facility] clinic if: BG persistently < 70 or > 300. . I reviewed the Rule of 15 for the treatment of hypoglycemia in detail with the patient. Literature supplied.    F/U in 4 month   Signed electronically by: Mack Guise, MD  [facility] End[facility]  [facility] [address] [place], [place] [address] Phone: [phone] FAX: [phone]   CC: Girtha Rm, NP-C [place] [address] Phone: [phone]  Fax: [phone]  Return to Endocrinology clinic as below: Future Appointments  Date Time Provider [place]  04/20/2020  9:50 AM Claudina Oliphant, Melanie Crazier, MD [facility] None  08/16/2020 10:00 AM [facility] None  08/16/2020 10:40 AM [name], MD  [facility] None  10/09/2020  9:30 AM Girtha Rm, NP-C [facility]  10/16/2020  9:30 AM [place], Clent Demark, MD [facility] None

## 2020-04-20 ENCOUNTER — Other Ambulatory Visit: Payer: Self-pay

## 2020-04-20 ENCOUNTER — Encounter: Payer: Self-pay | Admitting: Internal Medicine

## 2020-04-20 ENCOUNTER — Ambulatory Visit: Payer: Medicare HMO | Admitting: Internal Medicine

## 2020-04-20 VITALS — BP 124/80 | HR 76 | Ht 68.0 in | Wt 234.4 lb

## 2020-04-20 DIAGNOSIS — E1165 Type 2 diabetes mellitus with hyperglycemia: Secondary | ICD-10-CM

## 2020-04-20 LAB — POCT GLYCOSYLATED HEMOGLOBIN (HGB A1C): Hemoglobin A1C: 5.6 % (ref 4.0–5.6)

## 2020-04-20 NOTE — Patient Instructions (Addendum)
-   Keep up the Good Work ! - Continue Metformin 1000 mg Twice a day with meals - Continue Rybelsus 3 mg daily with breakfast     HOW TO TREAT LOW BLOOD SUGARS (Blood sugar LESS THAN 70 MG/DL)  Please follow the RULE OF 15 for the treatment of hypoglycemia treatment (when your (blood sugars are less than 70 mg/dL)    STEP 1: Take 15 grams of carbohydrates when your blood sugar is low, which includes:   3-4 GLUCOSE TABS  OR  3-4 OZ OF JUICE OR REGULAR SODA OR  ONE TUBE OF GLUCOSE GEL     STEP 2: RECHECK blood sugar in 15 MINUTES STEP 3: If your blood sugar is still low at the 15 minute recheck --> then, go back to STEP 1 and treat AGAIN with another 15 grams of carbohydrates.

## 2020-04-27 ENCOUNTER — Telehealth: Payer: Self-pay

## 2020-05-01 ENCOUNTER — Telehealth: Payer: Self-pay | Admitting: Internal Medicine

## 2020-05-01 ENCOUNTER — Other Ambulatory Visit: Payer: Self-pay

## 2020-05-01 MED ORDER — GLUCOSE BLOOD VI STRP: ORAL_STRIP | 2 refills | Status: DC

## 2020-05-01 NOTE — Telephone Encounter (Signed)
Rx sent 

## 2020-05-01 NOTE — Telephone Encounter (Signed)
Pt called and left vm that she is going to stop her DM meds and just self monitor. She is constantly having aching and she has been to ortho and noone can tell her whats going on so she is stopping DM meds to see if that is it and wants to know if she can get a referral to neurology to see if they can find out whats going on. Please advise

## 2020-05-01 NOTE — Telephone Encounter (Signed)
Medication Refill Request  Did you call your pharmacy and request this refill first? Yes   If patient has not contacted pharmacy first, instruct them to do so for future refills.   Remind them that contacting the pharmacy for their refill is the quickest method to get the refill.   Refill policy also stated that it will take anywhere between 24-72 hours to receive the refill.    Name of medication? Test strips  Is this a 90 day supply? Same as last time  Name and location of pharmacy?  [id number] - [place], [place] - [address] AT [address] Phone:  [phone]  Fax:  [phone]        Is the request for diabetes test strips? yes  If yes, what brand? accu-chek

## 2020-05-01 NOTE — Telephone Encounter (Signed)
I recommend she let her endocrinologist know about stopping her DM medications. In order to refer her, I would need to determine if this is appropriate. They require this. She can also discuss this with her orthopedist and if they think a neurology referral would be helpful, they may be willing to put in the referral.

## 2020-05-02 ENCOUNTER — Telehealth: Payer: Self-pay | Admitting: Cardiology

## 2020-05-02 ENCOUNTER — Other Ambulatory Visit: Payer: Self-pay

## 2020-05-02 DIAGNOSIS — E782 Mixed hyperlipidemia: Secondary | ICD-10-CM

## 2020-05-02 MED ORDER — INSULIN ASPART PROT & ASPART (70-30 MIX) 100 UNIT/ML PEN: 20.0000 [IU] | PEN_INJECTOR | Freq: Two times a day (BID) | SUBCUTANEOUS | 1 refills | Status: DC

## 2020-05-02 NOTE — Telephone Encounter (Signed)
Pt called to say she stopped metformin & rybelsus 5 days ago. Pain in right hip that radiates to foot has disappeared 2 days ago. Pt states no more pain & believes it is from stopping both medications. Please call pt [phone] to advise.

## 2020-05-02 NOTE — Telephone Encounter (Signed)
So I think for now we'll probably okay simply using the furosemide/potassium on a as needed basis for swelling weight gain more than 3 pounds in a day or more than 5 pounds in a week.  As for the statin, recommendation will be given another week and then restart at half the dose.  We will need to recheck lipid panel in roughly 3 months to determine if there is been a change.  Glenetta Hew, MD   Glenetta Hew, MD

## 2020-05-02 NOTE — Telephone Encounter (Signed)
Returned call to patient of Dr. Ellyn Hack who reports she had been having severe hip pain over the last few months. She has seen ortho for her hip - got cortisone injection which did not last.   Patient states she stopped ALL her medications 2 days ago to see if she could figure out the cause of her hip pain. She decided to resume entresto, spironolactone, carvedilol. She has not resumed furosemide/potassium, has not resumed statin.  She also stopped diabetic medications 4 days ago.   She had been taking furosemide based on her weight - she states she has lost about 10-12lbs (diet changes). She states her dry weight now is 230lbs. Last time I see changes to furosemide made based on sliding scale/etc was 07/2019 telemedicine note.   She remains OFF furosemide, potassium, atorvastatin, all diabetic medications and she is starting to feel a little better as far as hip pain   Advised will send message to Dr. Sharlyn Bologna RN as it is difficult to determine next best steps since patient made many recent changes to her medications

## 2020-05-02 NOTE — Telephone Encounter (Signed)
Patient has been given instruction and verbalized understanding

## 2020-05-02 NOTE — Telephone Encounter (Signed)
Pt was notified.  

## 2020-05-02 NOTE — Telephone Encounter (Signed)
Will call endo about meds and then contact ortho about pain med or referral

## 2020-05-02 NOTE — Telephone Encounter (Signed)
Patient would like to know if the direction would be changing on the insulin. Also wants to know what her sugar levels should be and if her sugar is low should she take insulin or skip dose.

## 2020-05-02 NOTE — Telephone Encounter (Signed)
Patient is calling about her medication she states she stopped taking them because she was having hip pain, she wants to know what her recourse will be. As well as she was taking her furosemide (LASIX) 40 MG tablet based on her weight she has lost some weight, she wants to know if she needs to change that. She has lost about 10-12lbs.

## 2020-05-02 NOTE — Telephone Encounter (Signed)
error 

## 2020-05-02 NOTE — Telephone Encounter (Signed)
Patient has stopped the Metformin and Rybelsus.  Patient thinks her hip pain was coming from these meds. Please advise

## 2020-05-03 NOTE — Telephone Encounter (Signed)
Spoke with patient about Kristin's advice  She states "all I know is I did not take it [carvedilol] today, and I am not in pain"  Advised that I will wait on Dr. Ellyn Hack to weigh in as I am unsure what plan needs to be at this time

## 2020-05-03 NOTE — Telephone Encounter (Signed)
Patient returned call - she states she has resumed entresto, spironolactone, carvedilol. She states one of these medications is causing her aches/pains as they resumed when she started back on these medications. She said the pain is to the point she "wants to cut her leg off"  Explained to patient it is not safe to stop and start cardiac medications as she has been doing and that she would need guidance on how to safely adjust medications to determine what may be causing her symptoms  Advised will send message back to MD and also [facility] pharmD

## 2020-05-03 NOTE — Telephone Encounter (Signed)
Patient got her Novolog but she said she needs pen needles as well.  (PEN NEEDLES) 32G X 6 MM MISC  [id number] - [place], [place] - [place] [address] AT [address] Phone:  [phone]  Fax:  [phone]

## 2020-05-03 NOTE — Telephone Encounter (Signed)
Left message to call back on [phone] (Home) *Preferred*   Sent recommendations from Dr. Ellyn Hack via [place] message

## 2020-05-03 NOTE — Telephone Encounter (Signed)
I would suspect the statin more than anything else.  Sometimes it takes 2-3 weeks for symptoms to go away.  She should continue with other medications (and PLEASE restart diabetes meds), and give it a little time.   Have her hold the statin completely for now.

## 2020-05-04 MED ORDER — PEN NEEDLES 32G X 6 MM MISC: 1.0000 | Freq: Two times a day (BID) | 6 refills | Status: DC

## 2020-05-04 NOTE — Addendum Note (Signed)
Addended by: Jefferson Fuel on: 05/04/2020 07:51 AM   Modules accepted: Orders

## 2020-05-05 NOTE — Telephone Encounter (Signed)
Hard to really tell what her symptoms are all about.  She needs to be on something for cholesterol.  But that is fine for holding her statin, she is understanding will eventually need to do something for cholesterol levels.  Glenetta Hew, MD

## 2020-05-08 ENCOUNTER — Other Ambulatory Visit: Payer: Self-pay | Admitting: Physician Assistant

## 2020-05-08 ENCOUNTER — Telehealth: Payer: Self-pay | Admitting: Orthopaedic Surgery

## 2020-05-08 MED ORDER — TRAMADOL HCL 50 MG PO TABS: 50.0000 mg | ORAL_TABLET | Freq: Two times a day (BID) | ORAL | 0 refills | Status: DC | PRN

## 2020-05-08 NOTE — Telephone Encounter (Signed)
Pt called stating she would like a CB in regards to the Cortisone injections she received  [phone]

## 2020-05-08 NOTE — Telephone Encounter (Signed)
Called patient and she states Dr. Junius Roads did an inj. It helped temp. Pain came back again. She has been taking Tylenol which is not helping at all.  Would like to know if they can send something into pharm or does she need to come back for another eval? I did advise her Dr. Erlinda Hong is out of the office.    [phone]

## 2020-05-08 NOTE — Telephone Encounter (Signed)
I just sent in tramadol.  If injection helped a lot, but just didn't last long, it is likely from hip

## 2020-05-10 NOTE — Telephone Encounter (Signed)
Patient has been trying to rule out what med(s) is causing her problems. She remains off statin for now. She reports bad hip pain when she added back in carvedilol last night. She also reports blood sugar fluctuations when she takes carvedilol   Sunday - she took entresto, metformin - no problems Monday - she took entresto, spironolactone, metformin, ryblesus - no problems Tuesday - she took entresto, spironolactone, metformin, rybelsus plus PM carvedilol - reports excruciating pain  Today - she took rybelsus - she plans to take spironolactone, carvedilol to see if she has symptoms  tonight she plans to take metformin, entresto  She was advised to schedule a visit to discuss her concerns/medications. Scheduled OV on 05/11/20 with Kerin Ransom PA as her medication issues are difficult to manage over the phone.

## 2020-05-11 ENCOUNTER — Ambulatory Visit (INDEPENDENT_AMBULATORY_CARE_PROVIDER_SITE_OTHER): Payer: Medicare HMO | Admitting: Cardiology

## 2020-05-11 ENCOUNTER — Other Ambulatory Visit: Payer: Self-pay

## 2020-05-11 ENCOUNTER — Encounter: Payer: Self-pay | Admitting: Cardiology

## 2020-05-11 VITALS — BP 144/96 | HR 84 | Ht 68.0 in | Wt 231.6 lb

## 2020-05-11 DIAGNOSIS — Z853 Personal history of malignant neoplasm of breast: Secondary | ICD-10-CM

## 2020-05-11 DIAGNOSIS — R9439 Abnormal result of other cardiovascular function study: Secondary | ICD-10-CM | POA: Diagnosis not present

## 2020-05-11 DIAGNOSIS — E782 Mixed hyperlipidemia: Secondary | ICD-10-CM

## 2020-05-11 DIAGNOSIS — G8929 Other chronic pain: Secondary | ICD-10-CM | POA: Diagnosis not present

## 2020-05-11 DIAGNOSIS — M25551 Pain in right hip: Secondary | ICD-10-CM

## 2020-05-11 DIAGNOSIS — I420 Dilated cardiomyopathy: Secondary | ICD-10-CM | POA: Diagnosis not present

## 2020-05-11 NOTE — Progress Notes (Signed)
Cardiology Office Note:    Date:  05/11/2020   ID:  Kristin Ward, DOB 1956/06/12, MRN [id number]  PCP:  Girtha Rm, NP-C  Cardiologist:  Glenetta Hew, MD  Electrophysiologist:  None   Referring MD: Girtha Rm, NP-C   CC: Rt hip pain which she attributes to medication  History of Present Illness:    Kristin Ward is a 64 y.o. female with a hx of nonischemic cardiomyopathy which was documented in 2017 when she lived in the [place] area.  At that time she reportedly had an ejection fraction of 18%.  She was placed on medical therapy.  There is no history of a diagnostic catheterization.  Her last assessment of her ejection fraction was by echocardiogram in 2019 and showed her EF to be 45 to 50%.  She did have breast cancer in 2018 that was treated with lumpectomy and chemotherapy.  Other problems include non-insulin-dependent diabetes hypertension.  She had been on statin therapy in the past but it stopped this.  Patient has had problems with right hip pain.  She has seen an orthopedist and had a hip injection which she said did not help.  There is DJD documented by x-ray.  The patient's become quite frustrated with her hip pain.  A week or 2 ago she decided to stop all of her medications.  She discussed this with her endocrinologist who adjusted her diabetes medications.  She tells me she started to resume her cardiac medications but noted when she takes carvedilol and spironolactone that the next day her hip is quite painful.  She is seen in the office for follow-up.  Since we have seen her last she is actually done well from a cardiac standpoint.  She has been losing weight, her weight is down to 230 now.  She has noted to increase in her blood pressure since she is been adjusting her medications.  She tells me she can take the Entresto in the carvedilol together without problems but it seems that the Aldactone in her mind is causing the problem.  Past Medical  History:  Diagnosis Date  . Abnormal x-ray of lungs with single pulmonary nodule 03/15/2016   per records from [place]   . Abnormal x-ray of lungs with single pulmonary nodule 03/15/2016   per records from [place]   . Anemia   . Asthma   . CHF (congestive heart failure) ([place])   . Cholelithiasis 05/19/2017   On CT  . Coronary artery calcification seen on CAT scan 05/19/2017  . Diabetes mellitus without complication ([place])    type 2  . 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 Diagnosed in [facility]  . Headache    NONE RECENT  . Hypertension   . Malignant neoplasm of lower-outer quadrant of left breast of female, estrogen receptor negative ([place]) 06/11/2017   left breast  . Mixed hyperlipidemia 06/17/2018  . partially blind   . Personal history of chemotherapy 2018-2019  . Personal history of radiation therapy 2019  . Retinitis pigmentosa   . Solitary pulmonary nodule on lung CT 03/16/2016   per records from [place]  . Uveitis     Past Surgical History:  Procedure Laterality Date  . brain cyst removed  2007   to help with headaches per patient , aspirated   . BREAST BIOPSY Left 06/06/2017   x2  . BREAST CYST ASPIRATION Left 06/06/2017  . BREAST LUMPECTOMY Left 06/26/2017  . BREAST LUMPECTOMY WITH RADIOACTIVE  SEED AND SENTINEL LYMPH NODE BIOPSY Left 06/26/2017   Procedure: BREAST LUMPECTOMY WITH RADIOACTIVE SEED AND SENTINEL LYMPH NODE BIOPSY ERAS  PATHWAY;  Surgeon: [name], MD;  Location: [place];  Service: General;  Laterality: Left;  pec block  . FINE NEEDLE ASPIRATION Left 02/23/2019   Procedure: FINE NEEDLE ASPIRATION;  Surgeon: [name], MD;  Location: [place];  Service: General;  Laterality: Left;  Asiration of left breast seroma   . NASAL TURBINATE REDUCTION    . PORT-A-CATH REMOVAL Right 02/23/2019   Procedure: REMOVAL PORT-A-CATH;  Surgeon: [name], MD;  Location: [place];   Service: General;  Laterality: Right;  . PORTACATH PLACEMENT N/A 06/26/2017   Procedure: INSERTION PORT-A-CATH;  Surgeon: [name], MD;  Location: [facility];  Service: General;  Laterality: N/A;  . TRANSTHORACIC ECHOCARDIOGRAM  12/2015   A) [facility] May 2017: Mild concentric LVH. Global hypokinesis. GR daily. EF 18% severe LA dilation. Mitral annular dilatation with papillary muscle dysfunction and moderate MR. Dilated IVC consistent with elevated RAP.  Marland Kitchen TRANSTHORACIC ECHOCARDIOGRAM  05/2017; 08/2017   a) Mild concentric LVH.  EF 45 to 50% with diffuse hypokinesis.  GR 1 DD.  Mild aortic root dilation.;; b)  Mild LVH EF 45 to 50%.  Diffuse HK.  No significant valve disease.  No significant change.    Current Medications: No outpatient medications have been marked as taking for the 05/11/20 encounter (Appointment) with Erlene Quan, PA-C.     Allergies:   Latex, Tylenol with codeine #3 [acetaminophen-codeine], and Penicillins   Social History   Socioeconomic History  . Marital status: Widowed    Spouse name: Not on file  . Number of children: 2  . Years of education: Not on file  . Highest education level: Not on file  Occupational History  . Not on file  Tobacco Use  . Smoking status: Former Smoker    Packs/day: 0.25    Years: 23.00    Pack years: 5.75    Types: Cigarettes    Quit date: 10/09/2015    Years since quitting: 4.5  . Smokeless tobacco: Never Used  Vaping Use  . Vaping Use: Never used  Substance and Sexual Activity  . Alcohol use: No  . Drug use: No  . Sexual activity: Never  Other Topics Concern  . Not on file  Social History Narrative   She is a widowed mother of 2 (daughter and son).   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   She moved to [place] apparently back in 2016, but was visiting family in [place] and was diagnosed with Cardiomyopathy-EF 18%.   She is usually accompanied by her daughter.    At baseline, she walks roughly 1-2  miles a day.   Social Determinants of Health   Financial Resource Strain:   . Difficulty of Paying Living Expenses: Not on file  Food Insecurity:   . Worried About Charity fundraiser in the Last Year: Not on file  . Ran Out of Food in the Last Year: Not on file  Transportation Needs:   . Lack of Transportation (Medical): Not on file  . Lack of Transportation (Non-Medical): Not on file  Physical Activity:   . Days of Exercise per Week: Not on file  . Minutes of Exercise per Session: Not on file  Stress:   . Feeling of Stress : Not on file  Social Connections:   . Frequency of Communication  with Friends and Family: Not on file  . Frequency of Social Gatherings with Friends and Family: Not on file  . Attends Religious Services: Not on file  . Active Member of Clubs or Organizations: Not on file  . Attends Archivist Meetings: Not on file  . Marital Status: Not on file     Family History: The patient's family history includes Breast cancer (age of onset: 27) in an other family member; Colon cancer (age of onset: 6) in her mother; Fibromyalgia in her sister; Heart attack in her father; Pancreatic cancer in an other family member. There is no history of Esophageal cancer, Rectal cancer, or Stomach cancer.  ROS:   Please see the history of present illness.     All other systems reviewed and are negative.  EKGs/Labs/Other Studies Reviewed:    The following studies were reviewed today: Echo 1/15/20219- Study Conclusions   - Left ventricle: The cavity size was normal. Wall thickness was  increased in a pattern of mild LVH. Systolic function was mildly  reduced. The estimated ejection fraction was in the range of 45%  to 50%. Diffuse hypokinesis. Doppler parameters are consistent  with abnormal left ventricular relaxation (grade 1 diastolic  dysfunction).  - Aortic valve: There was no stenosis.  - Mitral valve: Mildly calcified annulus. Normal thickness  leaflets  . There was trivial regurgitation.  - Right ventricle: The cavity size was normal. Systolic function  was normal.  - Tricuspid valve: Peak RV-RA gradient (S): 14 mm Hg.  - Pulmonary arteries: PA peak pressure: 17 mm Hg (S).  - Inferior vena cava: The vessel was normal in size. The  respirophasic diameter changes were in the normal range (>= 50%),  consistent with normal central venous pressure.   EKG:  EKG is not ordered today.  The ekg ordered 01/27/2020 demonstrates NSR, 1st AVB, HR 83  Recent Labs: 01/06/2020: Magnesium 2.0 02/16/2020: ALT 22; BUN 18; Creatinine, Ser 1.01; Hemoglobin 11.6; Platelets 337; Potassium 4.2; Sodium 143  Recent Lipid Panel    Component Value Date/Time   CHOL 114 01/06/2020 0820   TRIG 81 01/06/2020 0820   HDL 32 (L) 01/06/2020 0820   CHOLHDL 3.6 01/06/2020 0820   CHOLHDL 4.2 03/20/2017 0943   VLDL 21 03/20/2017 0943   LDLCALC 66 01/06/2020 0820    Physical Exam:    VS:  There were no vitals taken for this visit.    Wt Readings from Last 3 Encounters:  04/20/20 234 lb 6.4 oz (106.3 kg)  04/13/20 234 lb (106.1 kg)  04/05/20 234 lb 12.8 oz (106.5 kg)     GEN:  Well nourished, AA female, well developed in no acute distress HEENT: Normal NECK: No JVD CARDIAC: RRR, RESPIRATORY:  Clear to auscultation without rales, wheezing or rhonchi  ABDOMEN: Soft, non-tender, non-distended MUSCULOSKELETAL:  No edema; No deformity  SKIN: Warm and dry NEUROLOGIC:  Alert and oriented x 3 PSYCHIATRIC:  Normal affect   ASSESSMENT:    NICM- EF 18% in 2017- last EF 45-50% 2019. No CHF on exam.  Rt hip pain- It seems unlikely this is from a combination of her medications but she is convinced.  OK to stop Spironolactone and continue Entresto and Coreg.  Her new dry weight appears to be 230 lbs.  OK to take Lasix 40mg  and K+ 20 Meq PRN weight gain > 5 lbs in a week.  DM- Per PCP   PLAN:    Stop Aldactone- continue Entresto and Coreg. Virtual  f/u with me in 2 weeks.   Medication Adjustments/Labs and Tests Ordered: Current medicines are reviewed at length with the patient today.  Concerns regarding medicines are outlined above.  No orders of the defined types were placed in this encounter.  No orders of the defined types were placed in this encounter.   There are no Patient Instructions on file for this visit.   Signed, Kerin Ransom, PA-C  05/11/2020 1:59 PM    [facility]

## 2020-05-11 NOTE — Patient Instructions (Signed)
Medication Instructions:  RESTART- Furosemide 40 mg as needed for weight gain of 5 lbs in 1 week RESTART- Potassium 20 meq as needed, take when taking furosemide for weight gain of 5 lbs in 1 week STOP- Spironolactone  *If you need a refill on your cardiac medications before your next appointment, please call your pharmacy*   Lab Work: None Ordered   Testing/Procedures: None Ordered   Follow-Up: At [facility], you and your health needs are our priority.  As part of our continuing mission to provide you with exceptional heart care, we have created designated Provider Care Teams.  These Care Teams include your primary Cardiologist (physician) and Advanced Practice Providers (APPs -  Physician Assistants and Nurse Practitioners) who all work together to provide you with the care you need, when you need it.  We recommend signing up for the patient portal called "MyChart".  Sign up information is provided on this After Visit Summary.  MyChart is used to connect with patients for Virtual Visits (Telemedicine).  Patients are able to view lab/test results, encounter notes, upcoming appointments, etc.  Non-urgent messages can be sent to your provider as well.   To learn more about what you can do with MyChart, go to [email or web address].    Your next appointment:   October 19th @ 10:45 am  The format for your next appointment:   Virtual Visit   Provider:   You will see one of the following Advanced Practice Providers on your designated Care Team:    Kerin Ransom, [place]

## 2020-05-12 NOTE — Telephone Encounter (Signed)
Called patient no answer.

## 2020-05-12 NOTE — Telephone Encounter (Signed)
Just need to advise on message below.

## 2020-05-30 ENCOUNTER — Telehealth: Payer: Medicare HMO | Admitting: Cardiology

## 2020-06-05 ENCOUNTER — Other Ambulatory Visit: Payer: Self-pay | Admitting: Nurse Practitioner

## 2020-06-07 ENCOUNTER — Ambulatory Visit (INDEPENDENT_AMBULATORY_CARE_PROVIDER_SITE_OTHER): Payer: Medicare HMO | Admitting: Orthopaedic Surgery

## 2020-06-07 ENCOUNTER — Encounter: Payer: Self-pay | Admitting: Orthopaedic Surgery

## 2020-06-07 DIAGNOSIS — M25551 Pain in right hip: Secondary | ICD-10-CM

## 2020-06-07 DIAGNOSIS — M545 Low back pain, unspecified: Secondary | ICD-10-CM

## 2020-06-07 DIAGNOSIS — M79604 Pain in right leg: Secondary | ICD-10-CM

## 2020-06-07 MED ORDER — TRAMADOL HCL 50 MG PO TABS: ORAL_TABLET | ORAL | 0 refills | Status: DC

## 2020-06-07 NOTE — Progress Notes (Signed)
Office Visit Note   Patient: Niamh Rada           Date of Birth: [birth year]           MRN: [id number] Visit Date: 06/07/2020              Requested by: Girtha Rm, NP-C [place],  [place] [address] PCP: Girtha Rm, NP-C   Assessment & Plan: Visit Diagnoses:  1. Pain in right hip   2. Pain in right leg     Plan: Impression is right hip and right leg pain with symptoms consistent of right lower extremity radiculopathy, however she had significant relief during right hip intra-articular cortisone injection.  At this point, we feel it is appropriate to proceed with an MRI of her right hip as well as her lumbar spine to further assess for structural abnormalities.  She will follow up with [place] once has been completed.  Follow-Up Instructions: Return if symptoms worsen or fail to improve.   Orders:  No orders of the defined types were placed in this encounter.  No orders of the defined types were placed in this encounter.     Procedures: No procedures performed   Clinical Data: No additional findings.   Subjective: Chief Complaint  Patient presents with  . Right Hip - Pain    HPI patient is a pleasant 64 year old female who comes in today with recurrent right hip pain.  Majority of her pain is to the groin but also radiates to the lateral thigh and into the lateral lower leg.  She has increased pain at rest primarily when she is sitting.  She has been taking ibuprofen without significant relief of symptoms.  She does note tingling to the entire right lower extremity.  She was sent to Dr. Junius Roads for right hip intra-articular cortisone injection which did significantly help but only lasted during the anesthetic phase.  Review of Systems as detailed in HPI.  All others reviewed and are negative.   Objective: Vital Signs: There were no vitals taken for this visit.  Physical Exam well-developed well-nourished female no acute distress.  Alert  and oriented x3.  Ortho Exam right hip exam shows a negative logroll negative FADIR.  Negative straight leg raise.  She is neurovascular intact distally.  Specialty Comments:  No specialty comments available.  Imaging: No new imaging   PMFS History: Patient Active Problem List   Diagnosis Date Noted  . History of breast cancer 05/11/2020  . 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 Chronic right hip pain 04/05/2020  . Insulin dependent type 1 diabetes mellitus ([place]) 10/21/2019  . Vitamin D deficiency 10/07/2019  . Elevated serum creatinine 10/07/2019  . Diabetes mellitus, new onset ([place]) 01/20/2019  . Mixed hyperlipidemia 06/17/2018  . Influenza vaccination declined 06/17/2018  . Obesity (BMI 30-39.9) 06/17/2018  . Port-A-Cath in place 09/08/2017  . Other fatigue 08/29/2017  . Anemia 08/29/2017  . High risk medication use 08/29/2017  . Syncope 08/25/2017  . Blind in both eyes 08/25/2017  . Genetic testing 06/19/2017  . Family history of colon cancer   . Malignant neoplasm of lower-outer quadrant of left breast of female, estrogen receptor negative ([place]) 06/11/2017  . Cholelithiasis 05/19/2017  . Coronary artery calcification seen on CAT scan 05/19/2017  . Low grade squamous intraepith lesion on cytologic smear cervix ([place]) 04/09/2017  . HPV (human papilloma virus) infection 04/09/2017  . COPD (chronic obstructive pulmonary disease) ([place])   . Essential  hypertension 03/20/2017  . Screen for colon cancer 03/20/2017  . Retinitis pigmentosa   . Blind   . Solitary pulmonary nodule on lung CT 03/16/2016  . Abnormal myocardial perfusion study 03/15/2016  . Chronic combined systolic and diastolic heart failure ([place]) 12/28/2015  . Dilated cardiomyopathy ([place]) 12/27/2015   Past Medical History:  Diagnosis Date  . Abnormal x-ray of lungs with single pulmonary nodule 03/15/2016   per records from [place]   . Abnormal x-ray of lungs with single pulmonary nodule 03/15/2016    per records from [place]   . Anemia   . Asthma   . CHF (congestive heart failure) ([place])   . Cholelithiasis 05/19/2017   On CT  . Coronary artery calcification seen on CAT scan 05/19/2017  . Diabetes mellitus without complication ([place])    type 2  . Dilated idiopathic cardiomyopathy ([place]) 12/2015   EF 15-20%. Diagnosed in [facility]  . Headache    NONE RECENT  . Hypertension   . Malignant neoplasm of lower-outer quadrant of left breast of female, estrogen receptor negative ([place]) 06/11/2017   left breast  . Mixed hyperlipidemia 06/17/2018  . partially blind   . Personal history of chemotherapy 2018-2019  . Personal history of radiation therapy 2019  . Retinitis pigmentosa   . Solitary pulmonary nodule on lung CT 03/16/2016   per records from [place]  . Uveitis     Family History  Problem Relation Age of Onset  . Colon cancer Mother 29       deceased 72  . Heart attack Father   . Breast cancer Other 84       2nd cousin on maternal side; currently 20  . Pancreatic cancer Other        2nd cousin once removed on maternal side; deceased 77s  . Fibromyalgia Sister   . Esophageal cancer Neg Hx   . Rectal cancer Neg Hx   . Stomach cancer Neg Hx     Past Surgical History:  Procedure Laterality Date  . brain cyst removed  2007   to help with headaches per patient , aspirated   . BREAST BIOPSY Left 06/06/2017   x2  . BREAST CYST ASPIRATION Left 06/06/2017  . BREAST LUMPECTOMY Left 06/26/2017  . BREAST LUMPECTOMY WITH RADIOACTIVE SEED AND SENTINEL LYMPH NODE BIOPSY Left 06/26/2017   Procedure: BREAST LUMPECTOMY WITH RADIOACTIVE SEED AND SENTINEL LYMPH NODE BIOPSY ERAS  PATHWAY;  Surgeon: [name], MD;  Location: [facility];  Service: General;  Laterality: Left;  pec block  . FINE NEEDLE ASPIRATION Left 02/23/2019   Procedure: FINE NEEDLE ASPIRATION;  Surgeon: [name], MD;  Location: [facility];  Service: General;  Laterality: Left;  Asiration  of left breast seroma   . NASAL TURBINATE REDUCTION    . PORT-A-CATH REMOVAL Right 02/23/2019   Procedure: REMOVAL PORT-A-CATH;  Surgeon: [name], MD;  Location: [facility];  Service: General;  Laterality: Right;  . PORTACATH PLACEMENT N/A 06/26/2017   Procedure: INSERTION PORT-A-CATH;  Surgeon: [name], MD;  Location: [facility];  Service: General;  Laterality: N/A;  . TRANSTHORACIC ECHOCARDIOGRAM  12/2015   A) [facility] May 2017: Mild concentric LVH. Global hypokinesis. GR daily. EF 18% severe LA dilation. Mitral annular dilatation with papillary muscle dysfunction and moderate MR. Dilated IVC consistent with elevated RAP.  Marland Kitchen TRANSTHORACIC ECHOCARDIOGRAM  05/2017; 08/2017   a) Mild concentric LVH.  EF 45 to 50% with diffuse hypokinesis.  GR 1 DD.  Mild aortic root dilation.;; b)  Mild LVH EF 45 to 50%.  Diffuse HK.  No significant valve disease.  No significant change.   Social History   Occupational History  . Not on file  Tobacco Use  . Smoking status: Former Smoker    Packs/day: 0.25    Years: 23.00    Pack years: 5.75    Types: Cigarettes    Quit date: 10/09/2015    Years since quitting: 4.6  . Smokeless tobacco: Never Used  Vaping Use  . Vaping Use: Never used  Substance and Sexual Activity  . Alcohol use: No  . Drug use: No  . Sexual activity: Never

## 2020-06-09 ENCOUNTER — Telehealth: Payer: Self-pay | Admitting: *Deleted

## 2020-06-09 NOTE — Telephone Encounter (Signed)
°  Patient Consent for Virtual Visit         Kristin Ward has provided verbal consent on 06/09/2020 for a virtual visit (video or telephone).   CONSENT FOR VIRTUAL VISIT FOR:  Kristin Ward  By participating in this virtual visit I agree to the following:  I hereby voluntarily request, consent and authorize [facility] and its employed or contracted physicians, physician assistants, nurse practitioners or other licensed health care professionals (the Practitioner), to provide me with telemedicine health care services (the Services") as deemed necessary by the treating Practitioner. I acknowledge and consent to receive the Services by the Practitioner via telemedicine. I understand that the telemedicine visit will involve communicating with the Practitioner through live audiovisual communication technology and the disclosure of certain medical information by electronic transmission. I acknowledge that I have been given the opportunity to request an in-person assessment or other available alternative prior to the telemedicine visit and am voluntarily participating in the telemedicine visit.  I understand that I have the right to withhold or withdraw my consent to the use of telemedicine in the course of my care at any time, without affecting my right to future care or treatment, and that the Practitioner or I may terminate the telemedicine visit at any time. I understand that I have the right to inspect all information obtained and/or recorded in the course of the telemedicine visit and may receive copies of available information for a reasonable fee.  I understand that some of the potential risks of receiving the Services via telemedicine include:   Delay or interruption in medical evaluation due to technological equipment failure or disruption;  Information transmitted may not be sufficient (e.g. poor resolution of images) to allow for appropriate medical decision making by the  Practitioner; and/or   In rare instances, security protocols could fail, causing a breach of personal health information.  Furthermore, I acknowledge that it is my responsibility to provide information about my medical history, conditions and care that is complete and accurate to the best of my ability. I acknowledge that Practitioner's advice, recommendations, and/or decision may be based on factors not within their control, such as incomplete or inaccurate data provided by me or distortions of diagnostic images or specimens that may result from electronic transmissions. I understand that the practice of medicine is not an exact science and that Practitioner makes no warranties or guarantees regarding treatment outcomes. I acknowledge that a copy of this consent can be made available to me via my patient portal ([facility]), or I can request a printed copy by calling the office of [facility].    I understand that my insurance will be billed for this visit.   I have read or had this consent read to me.  I understand the contents of this consent, which adequately explains the benefits and risks of the Services being provided via telemedicine.   I have been provided ample opportunity to ask questions regarding this consent and the Services and have had my questions answered to my satisfaction.  I give my informed consent for the services to be provided through the use of telemedicine in my medical care

## 2020-06-15 ENCOUNTER — Telehealth: Payer: Medicare HMO | Admitting: Cardiology

## 2020-06-16 ENCOUNTER — Encounter: Payer: Self-pay | Admitting: Cardiology

## 2020-06-16 ENCOUNTER — Telehealth (INDEPENDENT_AMBULATORY_CARE_PROVIDER_SITE_OTHER): Payer: Medicare HMO | Admitting: Cardiology

## 2020-06-16 VITALS — BP 167/105 | HR 71 | Ht 67.0 in

## 2020-06-16 DIAGNOSIS — G8929 Other chronic pain: Secondary | ICD-10-CM | POA: Diagnosis not present

## 2020-06-16 DIAGNOSIS — I420 Dilated cardiomyopathy: Secondary | ICD-10-CM

## 2020-06-16 DIAGNOSIS — Z853 Personal history of malignant neoplasm of breast: Secondary | ICD-10-CM

## 2020-06-16 DIAGNOSIS — M25551 Pain in right hip: Secondary | ICD-10-CM | POA: Diagnosis not present

## 2020-06-16 DIAGNOSIS — I10 Essential (primary) hypertension: Secondary | ICD-10-CM

## 2020-06-16 MED ORDER — HYDRALAZINE HCL 25 MG PO TABS: 25.0000 mg | ORAL_TABLET | Freq: Two times a day (BID) | ORAL | 11 refills | Status: DC

## 2020-06-16 MED ORDER — FUROSEMIDE 20 MG PO TABS: 20.0000 mg | ORAL_TABLET | Freq: Every day | ORAL | 3 refills | Status: DC

## 2020-06-16 NOTE — Patient Instructions (Signed)
Medication Instructions:  Your physician has recommended you make the following change in your medication:   1.  Resume Lasix one tablet by mouth (20 mg) daily. 2.  Start Hydralazine one tablet by mouth (25mg ) twice daily.  Sent in today to requested pharmacy.   *If you need a refill on your cardiac medications before your next appointment, please call your pharmacy*   Lab Work: None  If you have labs (blood work) drawn today and your tests are completely normal, you will receive your results only by: Marland Kitchen MyChart Message (if you have MyChart) OR . A paper copy in the mail If you have any lab test that is abnormal or we need to change your treatment, we will call you to review the results.   Testing/Procedures: None   Follow-Up: At [facility], you and your health needs are our priority.  As part of our continuing mission to provide you with exceptional heart care, we have created designated Provider Care Teams.  These Care Teams include your primary Cardiologist (physician) and Advanced Practice Providers (APPs -  Physician Assistants and Nurse Practitioners) who all work together to provide you with the care you need, when you need it.  We recommend signing up for the patient portal called "MyChart".  Sign up information is provided on this After Visit Summary.  MyChart is used to connect with patients for Virtual Visits (Telemedicine).  Patients are able to view lab/test results, encounter notes, upcoming appointments, etc.  Non-urgent messages can be sent to your provider as well.   To learn more about what you can do with MyChart, go to [email or web address].    Your next appointment:   ASAP day(s)  The format for your next appointment:   In Person  Provider:   Glenetta Hew, MD   Other Instructions -None

## 2020-06-16 NOTE — Progress Notes (Signed)
Virtual Visit via Telephone Note   This visit type was conducted due to national recommendations for restrictions regarding the COVID-19 Pandemic (e.g. social distancing) in an effort to limit this patient's exposure and mitigate transmission in our community.  Due to her co-morbid illnesses, this patient is at least at moderate risk for complications without adequate follow up.  This format is felt to be most appropriate for this patient at this time.  The patient did not have access to video technology/had technical difficulties with video requiring transitioning to audio format only (telephone).  All issues noted in this document were discussed and addressed.  No physical exam could be performed with this format.  Please refer to the patient's chart for her  consent to telehealth for [facility].    Date:  06/16/2020   ID:  Kristin Ward, DOB 1955-09-19, MRN [id number] The patient was identified using 2 identifiers.  Patient Location: Home Provider Location: Home Office  PCP:  Girtha Rm, NP-C  Cardiologist:  Glenetta Hew, MD  Electrophysiologist:  None   Evaluation Performed:  Follow-Up Visit  Chief Complaint:  Elevated B/P  History of Present Illness:    Kristin Ward is a 64 y.o. female with a hx of nonischemic cardiomyopathy which was documented in 2017 when she lived in the [place] area.  At that time she reportedly had an ejection fraction of 18%.  She was placed on medical therapy.  There is no history of a diagnostic catheterization.  Her last assessment of her ejection fraction was by echocardiogram in 2019 and showed her EF to be 45 to 50%.  She did have breast cancer in 2018 that was treated with lumpectomy and chemotherapy.  Other problems include diabetes and hypertension.  She had been on statin therapy in the past but it stopped this.  The patient has had problems with right hip pain.  She has seen an orthopedist and had a hip injection which  she said did not help. The patient's become quite frustrated with her hip pain.  A few weeks ago she decided to stop all of her medications thinking they were causing her hip pain.  I saw her in the office 05/11/2020.  At that time we agreed she would resume her Entresto and Coreg.  She felt the Aldactone was causing her hip pain and she did not think she needed the Lasix as her weight was stable.    She was contacted today by phone for follow up. She has been taking NSAIDs for her hip pain.  Her B/P at home is poorly controlled- 165/107.  She has self-referred herself to a gastroenterologist as she now feels her hip pain may be related to chronic constipation.  I had a long discussion with the patient about her medications.  I explained to her that her nonsteroidal use may be contributing to her high blood pressure.  She has a prescription for tramadol and I suggested she try this.  She tells me she stopped all her DM medications and that her blood sugar has been controlled.  She wanted to know if she stopped her medications what her heart function revert to a low EF.  I explained to her that the medications she was on were to help preserve her heart function and to help control her blood pressure.  As her blood pressure is poorly controlled currently I asked that she resume her Lasix at 20 mg a day and added hydralazine 25 mg twice  daily.    The patient does not have symptoms concerning for COVID-19 infection (fever, chills, cough, or new shortness of breath).    Past Medical History:  Diagnosis Date  . Abnormal x-ray of lungs with single pulmonary nodule 03/15/2016   per records from [place]   . Abnormal x-ray of lungs with single pulmonary nodule 03/15/2016   per records from [place]   . Anemia   . Asthma   . CHF (congestive heart failure) ([place])   . Cholelithiasis 05/19/2017   On CT  . Coronary artery calcification seen on CAT scan 05/19/2017  . Diabetes mellitus without complication ([place])     type 2  . 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 Diagnosed in [facility]  . Headache    NONE RECENT  . Hypertension   . Malignant neoplasm of lower-outer quadrant of left breast of female, estrogen receptor negative ([place]) 06/11/2017   left breast  . Mixed hyperlipidemia 06/17/2018  . partially blind   . Personal history of chemotherapy 2018-2019  . Personal history of radiation therapy 2019  . Retinitis pigmentosa   . Solitary pulmonary nodule on lung CT 03/16/2016   per records from [place]  . Uveitis    Past Surgical History:  Procedure Laterality Date  . brain cyst removed  2007   to help with headaches per patient , aspirated   . BREAST BIOPSY Left 06/06/2017   x2  . BREAST CYST ASPIRATION Left 06/06/2017  . BREAST LUMPECTOMY Left 06/26/2017  . BREAST LUMPECTOMY WITH RADIOACTIVE SEED AND SENTINEL LYMPH NODE BIOPSY Left 06/26/2017   Procedure: BREAST LUMPECTOMY WITH RADIOACTIVE SEED AND SENTINEL LYMPH NODE BIOPSY ERAS  PATHWAY;  Surgeon: [name], MD;  Location: [facility];  Service: General;  Laterality: Left;  pec block  . FINE NEEDLE ASPIRATION Left 02/23/2019   Procedure: FINE NEEDLE ASPIRATION;  Surgeon: [name], MD;  Location: [place];  Service: General;  Laterality: Left;  Asiration of left breast seroma   . NASAL TURBINATE REDUCTION    . PORT-A-CATH REMOVAL Right 02/23/2019   Procedure: REMOVAL PORT-A-CATH;  Surgeon: [name], MD;  Location: [facility];  Service: General;  Laterality: Right;  . PORTACATH PLACEMENT N/A 06/26/2017   Procedure: INSERTION PORT-A-CATH;  Surgeon: [name], MD;  Location: [facility];  Service: General;  Laterality: N/A;  . TRANSTHORACIC ECHOCARDIOGRAM  12/2015   A) [facility] May 2017: Mild concentric LVH. Global hypokinesis. GR daily. EF 18% severe LA dilation. Mitral annular dilatation with papillary muscle dysfunction and moderate MR. Dilated IVC  consistent with elevated RAP.  Marland Kitchen TRANSTHORACIC ECHOCARDIOGRAM  05/2017; 08/2017   a) Mild concentric LVH.  EF 45 to 50% with diffuse hypokinesis.  GR 1 DD.  Mild aortic root dilation.;; b)  Mild LVH EF 45 to 50%.  Diffuse HK.  No significant valve disease.  No significant change.     Current Meds  Medication Sig  . aspirin 81 MG chewable tablet Chew 81 mg by mouth daily.  . carvedilol (COREG) 12.5 MG tablet Take 1 tablet (12.5 mg total) by mouth 2 (two) times daily with a meal.  . Cholecalciferol (D3-1000 PO) Take by mouth.  . diclofenac (VOLTAREN) 75 MG EC tablet Take 1 tablet (75 mg total) by mouth 2 (two) times daily.  . Multiple Vitamins-Minerals (WOMENS MULTI PO) Take by mouth.   . Saccharomyces boulardii (PROBIOTIC) 250 MG CAPS Take by mouth.  . sacubitril-valsartan (ENTRESTO) 97-103 MG Take 1  tablet by mouth 2 (two) times daily.  . vitamin B-12 (CYANOCOBALAMIN) 500 MCG tablet Take 500 mcg by mouth daily.  . vitamin C (ASCORBIC ACID) 500 MG tablet Take 1,000 mg 2 (two) times daily by mouth.     Allergies:   Latex, Tylenol with codeine #3 [acetaminophen-codeine], and Penicillins   Social History   Tobacco Use  . Smoking status: Former Smoker    Packs/day: 0.25    Years: 23.00    Pack years: 5.75    Types: Cigarettes    Quit date: 10/09/2015    Years since quitting: 4.6  . Smokeless tobacco: Never Used  Vaping Use  . Vaping Use: Never used  Substance Use Topics  . Alcohol use: No  . Drug use: No     Family Hx: The patient's family history includes Breast cancer (age of onset: 47) in an other family member; Colon cancer (age of onset: 35) in her mother; Fibromyalgia in her sister; Heart attack in her father; Pancreatic cancer in an other family member. There is no history of Esophageal cancer, Rectal cancer, or Stomach cancer.  ROS:   Please see the history of present illness.    All other systems reviewed and are negative.   Prior CV studies:   The following studies  were reviewed today:  Echo 1/15/20219- Study Conclusions   - Left ventricle: The cavity size was normal. Wall thickness was  increased in a pattern of mild LVH. Systolic function was mildly  reduced. The estimated ejection fraction was in the range of 45%  to 50%. Diffuse hypokinesis. Doppler parameters are consistent  with abnormal left ventricular relaxation (grade 1 diastolic  dysfunction).  - Aortic valve: There was no stenosis.  - Mitral valve: Mildly calcified annulus. Normal thickness leaflets  . There was trivial regurgitation.  - Right ventricle: The cavity size was normal. Systolic function  was normal.  - Tricuspid valve: Peak RV-RA gradient (S): 14 mm Hg.  - Pulmonary arteries: PA peak pressure: 17 mm Hg (S).  - Inferior vena cava: The vessel was normal in size. The  respirophasic diameter changes were in the normal range (>= 50%),  consistent with normal central venous pressure.    Labs/Other Tests and Data Reviewed:    EKG:  An ECG dated 01/27/2020 was personally reviewed today and demonstrated:   NSR, 1st AVB, HR 83  Recent Labs: 01/06/2020: Magnesium 2.0 02/16/2020: ALT 22; BUN 18; Creatinine, Ser 1.01; Hemoglobin 11.6; Platelets 337; Potassium 4.2; Sodium 143   Recent Lipid Panel Lab Results  Component Value Date/Time   CHOL 114 01/06/2020 08:20 AM   TRIG 81 01/06/2020 08:20 AM   HDL 32 (L) 01/06/2020 08:20 AM   CHOLHDL 3.6 01/06/2020 08:20 AM   CHOLHDL 4.2 03/20/2017 09:43 AM   LDLCALC 66 01/06/2020 08:20 AM    Wt Readings from Last 3 Encounters:  05/11/20 231 lb 9.6 oz (105.1 kg)  04/20/20 234 lb 6.4 oz (106.3 kg)  04/13/20 234 lb (106.1 kg)     Risk Assessment/Calculations:      Objective:    Vital Signs:  BP (!) 167/105   Pulse 71   Ht 5\' 7"  (1.702 m)   BMI 36.27 kg/m    VITAL SIGNS:  reviewed  ASSESSMENT & PLAN:    Uncontrolled HTN- She is taking the Entresto and Coreg- add Lasix 20mg  and Hydralazine 25 mg  BID  NICM- EF 18% in 2017- last EF 45-50% 2019. No CHF on exam.  Rt hip  pain- It seems unlikely this is from a combination of her medications. GI work scheduled as well as hip MRI.  DM- Per PCP  Plan: Medications as noted above- f/u with Dr Ellyn Hack as soon as possible to discuss medications and review B/P  COVID-19 Education: The signs and symptoms of COVID-19 were discussed with the patient and how to seek care for testing (follow up with PCP or arrange E-visit).   The importance of social distancing was discussed today.  Time:   Today, I have spent 30 minutes with the patient with telehealth technology discussing the above problems.     Medication Adjustments/Labs and Tests Ordered: Current medicines are reviewed at length with the patient today.  Concerns regarding medicines are outlined above.   Tests Ordered: No orders of the defined types were placed in this encounter.   Medication Changes: No orders of the defined types were placed in this encounter.   Follow Up:  In Person Dr Ellyn Hack  Signed, Kerin Ransom, PA-C  06/16/2020 8:27 AM    [place]

## 2020-06-23 ENCOUNTER — Encounter: Payer: Self-pay | Admitting: Nurse Practitioner

## 2020-06-23 ENCOUNTER — Other Ambulatory Visit (INDEPENDENT_AMBULATORY_CARE_PROVIDER_SITE_OTHER): Payer: Medicare HMO

## 2020-06-23 ENCOUNTER — Ambulatory Visit (INDEPENDENT_AMBULATORY_CARE_PROVIDER_SITE_OTHER): Payer: Medicare HMO | Admitting: Nurse Practitioner

## 2020-06-23 VITALS — BP 140/100 | HR 81 | Ht 67.0 in | Wt 224.0 lb

## 2020-06-23 DIAGNOSIS — K5900 Constipation, unspecified: Secondary | ICD-10-CM | POA: Diagnosis not present

## 2020-06-23 LAB — BASIC METABOLIC PANEL
BUN: 12 mg/dL (ref 6–23)
CO2: 28 mEq/L (ref 19–32)
Calcium: 9.6 mg/dL (ref 8.4–10.5)
Chloride: 107 mEq/L (ref 96–112)
Creatinine, Ser: 0.79 mg/dL (ref 0.40–1.20)
GFR: 80.41 mL/min (ref >60.00)
Glucose, Bld: 100 mg/dL — ABNORMAL HIGH (ref 70–99)
Potassium: 4.5 mEq/L (ref 3.5–5.1)
Sodium: 141 mEq/L (ref 135–145)

## 2020-06-23 LAB — CBC
HCT: 37.9 % (ref 36.0–46.0)
Hemoglobin: 12.6 g/dL (ref 12.0–15.0)
MCHC: 33.3 g/dL (ref 30.0–36.0)
MCV: 87.5 fl (ref 78.0–100.0)
Platelets: 364 10*3/uL (ref 150.0–400.0)
RBC: 4.34 Mil/uL (ref 3.87–5.11)
RDW: 14.1 % (ref 11.5–15.5)
WBC: 6.7 10*3/uL (ref 4.0–10.5)

## 2020-06-23 LAB — TSH: TSH: 3.71 u[IU]/mL (ref 0.35–4.50)

## 2020-06-23 MED ORDER — LINACLOTIDE 72 MCG PO CAPS: 72.0000 ug | ORAL_CAPSULE | Freq: Every day | ORAL | 1 refills | Status: DC

## 2020-06-23 NOTE — Patient Instructions (Addendum)
If you are age 62 or older, your body mass index should be between 23-30. Your Body mass index is 35.08 kg/m. If this is out of the aforementioned range listed, please consider follow up with your Primary Care Provider.  If you are age 70 or younger, your body mass index should be between 19-25. Your Body mass index is 35.08 kg/m. If this is out of the aformentioned range listed, please consider follow up with your Primary Care Provider.   Your provider has requested that you go to the basement level for lab work before leaving today. Press "B" on the elevator. The lab is located at the first door on the left as you exit the elevator.  Due to recent changes in healthcare laws, you may see the results of your imaging and laboratory studies on MyChart before your provider has had a chance to review them.  We understand that in some cases there may be results that are confusing or concerning to you. Not all laboratory results come back in the same time frame and the provider may be waiting for multiple results in order to interpret others.  Please give [place] 48 hours in order for your provider to thoroughly review all the results before contacting the office for clarification of your results.   START Linzess 72 mcg 1 capsule every morning before breakfast.  Reduce water enema use  Try Glycerin suppositories 1 rectally every day as needed.  Call the office if your symptoms get worse.  Call the office to make a follow up appointment in 3-4 months with Dr. Silverio Decamp.

## 2020-06-23 NOTE — Progress Notes (Signed)
06/23/2020 Kristin Ward [id number] [birth year]   Chief Complaint: constipation   History of Present Illness: Kristin Ward. Kristin Ward is a 64 year old female with a past medical history of asthma, cardiomyopathy on Entresto, DM II, breast cancer, anemia, she is legally blind and constipation.  He presents today for further evaluation regarding constipation.  He denies having any upper or lower abdominal pain.  She reports passing hard balls of stool daily after she uses a distilled water enema daily since 2017.  She is visually impaired therefore she is unable to determine if she has any rectal bleeding or black stools.  She reported taking MiraLAX in the past which was ineffective.  She stated her constipation started after she was placed on diuretics for her CHF.  She heard a commercial for Linzess and she would like to try this medication.  She drinks at least 8 glasses of water daily.  She follows a vegetarian diet.  Her most recent colonoscopy was 06/09/2018 by Dr. Silverio Decamp and one 7 mm polyp was removed from the descending colon, biopsy showed polypoid colonic mucosa with lymphoid aggregate and not a true polyp.  She was advised to repeat a colonoscopy in 5 years.  No GERD symptoms.   CBC Latest Ref Rng & Units 02/16/2020 08/20/2019 04/02/2019  WBC 4.0 - 10.5 K/uL 6.6 6.3 5.6  Hemoglobin 12.0 - 15.0 g/dL 11.6(L) 12.1 12.3  Hematocrit 36 - 46 % 36.1 35.9(L) 37.2  Platelets 150 - 400 K/uL 337 313 314  MCV 89.4.  CMP Latest Ref Rng & Units 02/16/2020 08/20/2019 04/02/2019  Glucose 70 - 99 mg/dL 117(H) 379(H) 124(H)  BUN 8 - 23 mg/dL 18 19 12   Creatinine 0.44 - 1.00 mg/dL 1.01(H) 1.02(H) 0.79  Sodium 135 - 145 mmol/L 143 139 141  Potassium 3.5 - 5.1 mmol/L 4.2 4.1 3.9  Chloride 98 - 111 mmol/L 110 106 110  CO2 22 - 32 mmol/L 24 23 22   Calcium 8.9 - 10.3 mg/dL 9.5 9.3 9.2  Total Protein 6.5 - 8.1 g/dL 7.9 7.6 7.3  Total Bilirubin 0.3 - 1.2 mg/dL 0.5 0.4 0.4  Alkaline Phos 38 - 126 U/L 112  115 111  AST 15 - 41 U/L 27 16 15   ALT 0 - 44 U/L 22 18 16      Past Medical History:  Diagnosis Date  . Abnormal x-ray of lungs with single pulmonary nodule 03/15/2016   per records from [place]   . Abnormal x-ray of lungs with single pulmonary nodule 03/15/2016   per records from [place]   . Anemia   . Asthma   . CHF (congestive heart failure) ([place])   . Cholelithiasis 05/19/2017   On CT  . Coronary artery calcification seen on CAT scan 05/19/2017  . Diabetes mellitus without complication ([place])    type 2  . Dilated idiopathic cardiomyopathy ([place]) 12/2015   EF 15-20%. Diagnosed in [facility]  . Headache    NONE RECENT  . Hypertension   . Malignant neoplasm of lower-outer quadrant of left breast of female, estrogen receptor negative ([place]) 06/11/2017   left breast  . Mixed hyperlipidemia 06/17/2018  . partially blind   . Personal history of chemotherapy 2018-2019  . Personal history of radiation therapy 2019  . Retinitis pigmentosa   . Solitary pulmonary nodule on lung CT 03/16/2016   per records from [place]  . Uveitis    Past Surgical History:  Procedure Laterality Date  . brain cyst removed  2007  to help with headaches per patient , aspirated   . BREAST BIOPSY Left 06/06/2017   x2  . BREAST CYST ASPIRATION Left 06/06/2017  . BREAST LUMPECTOMY Left 06/26/2017  . BREAST LUMPECTOMY WITH RADIOACTIVE SEED AND SENTINEL LYMPH NODE BIOPSY Left 06/26/2017   Procedure: BREAST LUMPECTOMY WITH RADIOACTIVE SEED AND SENTINEL LYMPH NODE BIOPSY ERAS  PATHWAY;  Surgeon: [name], MD;  Location: [place];  Service: General;  Laterality: Left;  pec block  . FINE NEEDLE ASPIRATION Left 02/23/2019   Procedure: FINE NEEDLE ASPIRATION;  Surgeon: [name], MD;  Location: [place];  Service: General;  Laterality: Left;  Asiration of left breast seroma   . NASAL TURBINATE REDUCTION    . PORT-A-CATH REMOVAL Right 02/23/2019   Procedure: REMOVAL  PORT-A-CATH;  Surgeon: [name], MD;  Location: [place];  Service: General;  Laterality: Right;  . PORTACATH PLACEMENT N/A 06/26/2017   Procedure: INSERTION PORT-A-CATH;  Surgeon: [name], MD;  Location: [place];  Service: General;  Laterality: N/A;  . TRANSTHORACIC ECHOCARDIOGRAM  12/2015   A) [facility] May 2017: Mild concentric LVH. Global hypokinesis. GR daily. EF 18% severe LA dilation. Mitral annular dilatation with papillary muscle dysfunction and moderate MR. Dilated IVC consistent with elevated RAP.  Marland Kitchen TRANSTHORACIC ECHOCARDIOGRAM  05/2017; 08/2017   a) Mild concentric LVH.  EF 45 to 50% with diffuse hypokinesis.  GR 1 DD.  Mild aortic root dilation.;; b)  Mild LVH EF 45 to 50%.  Diffuse HK.  No significant valve disease.  No significant change.    Current Outpatient Medications on File Prior to Visit  Medication Sig Dispense Refill  . aspirin 81 MG chewable tablet Chew 81 mg by mouth daily.    . carvedilol (COREG) 12.5 MG tablet Take 1 tablet (12.5 mg total) by mouth 2 (two) times daily with a meal. 180 tablet 1  . Cholecalciferol (D3-1000 PO) Take by mouth.    . diclofenac (VOLTAREN) 75 MG EC tablet Take 1 tablet (75 mg total) by mouth 2 (two) times daily. 60 tablet 1  . furosemide (LASIX) 20 MG tablet Take 1 tablet (20 mg total) by mouth daily. 90 tablet 3  . hydrALAZINE (APRESOLINE) 25 MG tablet Take 1 tablet (25 mg total) by mouth in the morning and at bedtime. 60 tablet 11  . metFORMIN (GLUCOPHAGE) 1000 MG tablet Take 1 tablet (1,000 mg total) by mouth 2 (two) times daily with a meal. 180 tablet 3  . Multiple Vitamins-Minerals (WOMENS MULTI PO) Take by mouth.     . Saccharomyces boulardii (PROBIOTIC) 250 MG CAPS Take by mouth.    . sacubitril-valsartan (ENTRESTO) 97-103 MG Take 1 tablet by mouth 2 (two) times daily. 180 tablet 3  . Semaglutide (RYBELSUS) 3 MG TABS Take 3 mg by mouth daily with breakfast. 30 tablet 3  . traMADol (ULTRAM) 50 MG  tablet Take 1-2 tabs po bid prn pain 30 tablet 0  . vitamin B-12 (CYANOCOBALAMIN) 500 MCG tablet Take 500 mcg by mouth daily.    . vitamin C (ASCORBIC ACID) 500 MG tablet Take 1,000 mg 2 (two) times daily by mouth.     No current facility-administered medications on file prior to visit.   Allergies  Allergen Reactions  . Latex Hives and Itching    Burning   . Tylenol With Codeine #3 [Acetaminophen-Codeine] Nausea And Vomiting  . Penicillins Nausea Only, Swelling and Rash    Has patient had a PCN reaction causing immediate rash,  facial/tongue/throat swelling, SOB or lightheadedness with hypotension: Yes Has patient had a PCN reaction causing severe rash involving mucus membranes or skin necrosis: Yes Has patient had a PCN reaction that required hospitalization: No Has patient had a PCN reaction occurring within the last 10 years: No TONGUE SWELLING AND RASH AROUND MOUTH If all of the above answers are "NO", then may proceed with Cephalosporin use.     Current Medications, Allergies, Past Medical History, Past Surgical History, Family History and Social History were reviewed in [employer] record.   Review of Systems:   Constitutional: Negative for fever, sweats, chills or weight loss.  Respiratory: Negative for shortness of breath.   Cardiovascular: Negative for chest pain, palpitations and leg swelling.  Gastrointestinal: See HPI.  Musculoskeletal: Negative for back pain or muscle aches.  Neurological: Negative for dizziness, headaches or paresthesias.    Physical Exam: BP (!) 140/100 (BP Location: Right Arm, Patient Position: Sitting)   Pulse 81   Ht 5\' 7"  (1.702 m)   Wt 224 lb (101.6 kg)   SpO2 97%   BMI 35.08 kg/m  General: Well developed 64 year old female in no acute distress. Head: Normocephalic and atraumatic. Ears: Normal auditory acuity. Mouth: Dentition intact. No ulcers or lesions.  Lungs: Clear throughout to auscultation. Heart: Regular  rate and rhythm, no murmur. Abdomen: Soft, nontender and nondistended. No masses or hepatomegaly. Normal bowel sounds x 4 quadrants.  Rectal: Deferred. Musculoskeletal: Symmetrical with no gross deformities. Extremities: No edema. Neurological: Alert oriented x 4. No focal deficits.  Psychological: Alert and cooperative. Normal mood and affect  Assessment and Recommendations:  69.  64 year old female with constipation. -Trial with Linzess 72 mcg 1 p.o. daily to be taken 30 minutes before breakfast -Decrease water enema use.  Glycerin suppository 1 PR daily as needed. -Patient to call our office if symptoms worsen or if no improvement -TSH -Follow-up in the office in 3 to 4 months and as needed  2.  Mild normocytic anemia.  B12 deficiency on oral B12 supplement.  -CBC, BMP  3.  Colon cancer screening.  First-degree relative (mother) with history of colon cancer. -Next colonoscopy due October 2024

## 2020-06-29 ENCOUNTER — Ambulatory Visit
Admission: RE | Admit: 2020-06-29 | Discharge: 2020-06-29 | Disposition: A | Discharge status: Home / Self Care | Payer: Medicare HMO | Source: Ambulatory Visit | Attending: Orthopaedic Surgery | Admitting: Orthopaedic Surgery

## 2020-06-29 ENCOUNTER — Other Ambulatory Visit: Payer: Self-pay

## 2020-06-29 ENCOUNTER — Telehealth: Payer: Self-pay | Admitting: Orthopaedic Surgery

## 2020-06-29 DIAGNOSIS — M545 Low back pain, unspecified: Secondary | ICD-10-CM

## 2020-06-29 DIAGNOSIS — M25551 Pain in right hip: Secondary | ICD-10-CM

## 2020-06-29 DIAGNOSIS — M79604 Pain in right leg: Secondary | ICD-10-CM

## 2020-06-29 NOTE — Telephone Encounter (Signed)
Left voice mail

## 2020-06-29 NOTE — Telephone Encounter (Signed)
Patient called requesting a call back. Patient states she was scheduled for her MRI and could not complete the appt due to severe pains. She need a call back on what she need to do next. Patient phone [phone].

## 2020-07-04 ENCOUNTER — Ambulatory Visit (INDEPENDENT_AMBULATORY_CARE_PROVIDER_SITE_OTHER): Payer: Medicare HMO | Admitting: Orthopaedic Surgery

## 2020-07-04 ENCOUNTER — Telehealth: Payer: Self-pay

## 2020-07-04 ENCOUNTER — Other Ambulatory Visit: Payer: Self-pay

## 2020-07-04 ENCOUNTER — Other Ambulatory Visit: Payer: Self-pay | Admitting: Physician Assistant

## 2020-07-04 DIAGNOSIS — M79604 Pain in right leg: Secondary | ICD-10-CM | POA: Diagnosis not present

## 2020-07-04 MED ORDER — OXYCODONE-ACETAMINOPHEN 5-325 MG PO TABS
ORAL_TABLET | ORAL | 0 refills | Status: DC
Start: 2020-07-04 — End: 2020-08-01

## 2020-07-04 NOTE — Telephone Encounter (Signed)
Please see below, I also called pt back left vm stating she could call to reschedule her appt.

## 2020-07-04 NOTE — Telephone Encounter (Signed)
Pt was scheduled on 06/29/20 with [facility] but cancelled her appt because she was in too much pain. Per appt desk.

## 2020-07-04 NOTE — Progress Notes (Signed)
Office Visit Note   Patient: Kristin Ward           Date of Birth: 08/14/1955           MRN: [id number] Visit Date: 07/04/2020              Requested by: Girtha Rm, NP-C [place],  [place] [id number] PCP: Girtha Rm, NP-C   Assessment & Plan: Visit Diagnoses:  1. Pain in right leg     Plan: Impression is continued right leg pain with questionable lumbar versus hip pathology.  We will reorder MRIs of the lumbar spine as well as right hip.  We have discussed taking pain medication prior to her arrival for the MRI.  I have agreed to call in Percocet for this.  She will follow up with [place] once they have been completed.  Follow-Up Instructions: Return for after MRI lumar spine/right hip.   Orders:  No orders of the defined types were placed in this encounter.  Meds ordered this encounter  Medications  . oxyCODONE-acetaminophen (PERCOCET) 5-325 MG tablet    Sig: Take 1-2 pills 30 minutes to one hour prior to MRI for pain control during the scan    Dispense:  10 tablet    Refill:  0      Procedures: No procedures performed   Clinical Data: No additional findings.   Subjective: Chief Complaint  Patient presents with  . scan review    patient reports has not had scan yet    HPI patient is a pleasant 64 year old female who comes in today with continued right leg pain.  We have recently seen her for this where it was hard to differentiate whether this was coming from her back or her hip.  She had a right hip joint injection by Dr. Junius Roads back in early September which did help quite a bit but only during the anesthetic phase.  We ordered MRIs of her lumbar spine as well as her right hip at her last visit.  She was unable to complete her MRI last week due to pain soon as she laid supine on the table.     Objective: Vital Signs: There were no vitals taken for this visit.    Ortho Exam stable exam  Specialty Comments:  No specialty  comments available.  Imaging: No new imaging   PMFS History: Patient Active Problem List   Diagnosis Date Noted  . History of breast cancer 05/11/2020  . 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 Chronic right hip pain 04/05/2020  . Insulin dependent type 1 diabetes mellitus ([place]) 10/21/2019  . Vitamin D deficiency 10/07/2019  . Elevated serum creatinine 10/07/2019  . Diabetes mellitus, new onset ([place]) 01/20/2019  . Mixed hyperlipidemia 06/17/2018  . Influenza vaccination declined 06/17/2018  . Obesity (BMI 30-39.9) 06/17/2018  . Port-A-Cath in place 09/08/2017  . Other fatigue 08/29/2017  . Anemia 08/29/2017  . High risk medication use 08/29/2017  . Syncope 08/25/2017  . Blind in both eyes 08/25/2017  . Genetic testing 06/19/2017  . Family history of colon cancer   . Malignant neoplasm of lower-outer quadrant of left breast of female, estrogen receptor negative ([place]) 06/11/2017  . Cholelithiasis 05/19/2017  . Coronary artery calcification seen on CAT scan 05/19/2017  . Low grade squamous intraepith lesion on cytologic smear cervix ([place]) 04/09/2017  . HPV (human papilloma virus) infection 04/09/2017  . COPD (chronic obstructive pulmonary disease) ([place])   . Essential hypertension 03/20/2017  .  Screen for colon cancer 03/20/2017  . Retinitis pigmentosa   . Blind   . Solitary pulmonary nodule on lung CT 03/16/2016  . Abnormal myocardial perfusion study 03/15/2016  . Chronic combined systolic and diastolic heart failure ([place]) 12/28/2015  . Dilated cardiomyopathy ([place]) 12/27/2015   Past Medical History:  Diagnosis Date  . Abnormal x-ray of lungs with single pulmonary nodule 03/15/2016   per records from [place]   . Abnormal x-ray of lungs with single pulmonary nodule 03/15/2016   per records from [place]   . Anemia   . Asthma   . CHF (congestive heart failure) ([place])   . Cholelithiasis 05/19/2017   On CT  . Coronary artery calcification seen on CAT scan 05/19/2017    . Diabetes mellitus without complication ([place])    type 2  . Dilated idiopathic cardiomyopathy ([place]) 12/2015   EF 15-20%. Diagnosed in [facility]  . Headache    NONE RECENT  . Hypertension   . Malignant neoplasm of lower-outer quadrant of left breast of female, estrogen receptor negative ([place]) 06/11/2017   left breast  . Mixed hyperlipidemia 06/17/2018  . partially blind   . Personal history of chemotherapy 2018-2019  . Personal history of radiation therapy 2019  . Retinitis pigmentosa   . Solitary pulmonary nodule on lung CT 03/16/2016   per records from [name]  . Uveitis     Family History  Problem Relation Age of Onset  . Colon cancer Mother 59       deceased 33  . Heart attack Father   . Breast cancer Other 24       2nd cousin on maternal side; currently 33  . Pancreatic cancer Other        2nd cousin once removed on maternal side; deceased 55s  . Fibromyalgia Sister   . Esophageal cancer Neg Hx   . Rectal cancer Neg Hx   . Stomach cancer Neg Hx     Past Surgical History:  Procedure Laterality Date  . brain cyst removed  2007   to help with headaches per patient , aspirated   . BREAST BIOPSY Left 06/06/2017   x2  . BREAST CYST ASPIRATION Left 06/06/2017  . BREAST LUMPECTOMY Left 06/26/2017  . BREAST LUMPECTOMY WITH RADIOACTIVE SEED AND SENTINEL LYMPH NODE BIOPSY Left 06/26/2017   Procedure: BREAST LUMPECTOMY WITH RADIOACTIVE SEED AND SENTINEL LYMPH NODE BIOPSY ERAS  PATHWAY;  Surgeon: [name], MD;  Location: [place];  Service: General;  Laterality: Left;  pec block  . FINE NEEDLE ASPIRATION Left 02/23/2019   Procedure: FINE NEEDLE ASPIRATION;  Surgeon: [name], MD;  Location: [place];  Service: General;  Laterality: Left;  Asiration of left breast seroma   . NASAL TURBINATE REDUCTION    . PORT-A-CATH REMOVAL Right 02/23/2019   Procedure: REMOVAL PORT-A-CATH;  Surgeon: [name], MD;  Location: [place];  Service: General;  Laterality: Right;  . PORTACATH PLACEMENT N/A 06/26/2017   Procedure: INSERTION PORT-A-CATH;  Surgeon: [name], MD;  Location: [place];  Service: General;  Laterality: N/A;  . TRANSTHORACIC ECHOCARDIOGRAM  12/2015   A) [facility] May 2017: Mild concentric LVH. Global hypokinesis. GR daily. EF 18% severe LA dilation. Mitral annular dilatation with papillary muscle dysfunction and moderate MR. Dilated IVC consistent with elevated RAP.  Marland Kitchen TRANSTHORACIC ECHOCARDIOGRAM  05/2017; 08/2017   a) Mild concentric LVH.  EF 45 to 50% with diffuse hypokinesis.  GR 1 DD.  Mild aortic root  dilation.;; b)  Mild LVH EF 45 to 50%.  Diffuse HK.  No significant valve disease.  No significant change.   Social History   Occupational History  . Not on file  Tobacco Use  . Smoking status: Former Smoker    Packs/day: 0.25    Years: 23.00    Pack years: 5.75    Types: Cigarettes    Quit date: 10/09/2015    Years since quitting: 4.7  . Smokeless tobacco: Never Used  Vaping Use  . Vaping Use: Never used  Substance and Sexual Activity  . Alcohol use: No  . Drug use: No  . Sexual activity: Never

## 2020-07-04 NOTE — Telephone Encounter (Signed)
Patient had an appointment this morning to review MRI scans. She came for her appointment and advised [place] she never had scans. Dr Erlinda Hong and Mendel Ryder would like these re ordered for patient.

## 2020-07-10 ENCOUNTER — Other Ambulatory Visit: Payer: Self-pay | Admitting: Physician Assistant

## 2020-07-10 ENCOUNTER — Telehealth: Payer: Self-pay

## 2020-07-10 ENCOUNTER — Encounter: Payer: Self-pay | Admitting: Cardiology

## 2020-07-10 ENCOUNTER — Ambulatory Visit (INDEPENDENT_AMBULATORY_CARE_PROVIDER_SITE_OTHER): Payer: Medicare HMO | Admitting: Cardiology

## 2020-07-10 ENCOUNTER — Other Ambulatory Visit: Payer: Self-pay

## 2020-07-10 VITALS — BP 140/73 | HR 74 | Temp 96.8°F | Ht 67.0 in | Wt 226.4 lb

## 2020-07-10 DIAGNOSIS — I10 Essential (primary) hypertension: Secondary | ICD-10-CM | POA: Diagnosis not present

## 2020-07-10 DIAGNOSIS — R9439 Abnormal result of other cardiovascular function study: Secondary | ICD-10-CM | POA: Diagnosis not present

## 2020-07-10 DIAGNOSIS — I420 Dilated cardiomyopathy: Secondary | ICD-10-CM | POA: Diagnosis not present

## 2020-07-10 DIAGNOSIS — E669 Obesity, unspecified: Secondary | ICD-10-CM

## 2020-07-10 DIAGNOSIS — I2510 Atherosclerotic heart disease of native coronary artery without angina pectoris: Secondary | ICD-10-CM | POA: Diagnosis not present

## 2020-07-10 DIAGNOSIS — I5042 Chronic combined systolic (congestive) and diastolic (congestive) heart failure: Secondary | ICD-10-CM

## 2020-07-10 MED ORDER — FUROSEMIDE 20 MG PO TABS
40.0000 mg | ORAL_TABLET | ORAL | 3 refills | Status: DC | PRN
Start: 2020-07-10 — End: 2020-11-06

## 2020-07-10 MED ORDER — SPIRONOLACTONE 25 MG PO TABS: 25.0000 mg | ORAL_TABLET | Freq: Every day | ORAL | 3 refills | Status: DC

## 2020-07-10 MED ORDER — HYDRALAZINE HCL 25 MG PO TABS: 25.0000 mg | ORAL_TABLET | ORAL | 11 refills | Status: DC

## 2020-07-10 NOTE — Telephone Encounter (Signed)
I called patient and advised. She states that she is having to take 2 Oxycodone due to the amount of pain she is in. I explained that this medication was only sent in for the MRI. She states that the tramadol does not help at all, she cannot sleep, and she needs a refill of the Oxycodone.  I did tell patient that I would have to send message back to you and tried to explain that we would not want her on Oxycodone for any length so that in the instance she needs surgery, the pain medication will help post op. She advised she was going to have to find someone to help her with this pain if I didn't.  Please advise.

## 2020-07-10 NOTE — Telephone Encounter (Signed)
Please advise 

## 2020-07-10 NOTE — Patient Instructions (Addendum)
Medication Instructions:    CHANGE TO HYDRALAZINE TO TAKE AS NEEDED  ONE TO TWO TIMES A DAY FOR BLOOD PRESSURE  767 SYSTOLIC BLOOD PRESSURE.   RESTART TAKING SPIRONOLACTONE 25 MG DAILY   MAY TAKE LASIX 40 MG  IF WEIGHT IS 3 TO 4 LBS HEAVIER OVERNIGHT- TAKE AS NEEDED  *If you need a refill on your cardiac medications before your next appointment, please call your pharmacy*   Lab Work:  [place] 2 WEEKS  If you have labs (blood work) drawn today and your tests are completely normal, you will receive your results only by: Marland Kitchen MyChart Message (if you have MyChart) OR . A paper copy in the mail If you have any lab test that is abnormal or we need to change your treatment, we will call you to review the results.   Testing/Procedures: Not needed  Follow-Up: At [facility], you and your health needs are our priority.  As part of our continuing mission to provide you with exceptional heart care, we have created designated Provider Care Teams.  These Care Teams include your primary Cardiologist (physician) and Advanced Practice Providers (APPs -  Physician Assistants and Nurse Practitioners) who all work together to provide you with the care you need, when you need it.     Your next appointment:   4 month(s)  The format for your next appointment:   In Person  Provider:   Glenetta Hew, MD   Other Instructions

## 2020-07-10 NOTE — Telephone Encounter (Signed)
Patient called in wanting to advise pain medication she was prescribed has been working , still in pain but she is wanting a refill.

## 2020-07-10 NOTE — Telephone Encounter (Signed)
She was told this was for the MRI.

## 2020-07-10 NOTE — Telephone Encounter (Signed)
I can call in the day before her MRI so let me know when that is.  If she needs oxycodone in the meantime as she told me in the office that nothing else works, she will need to get from pcp or we can refer to pain clinic.  We had a lengthy discussion about this in the office last week

## 2020-07-10 NOTE — Progress Notes (Signed)
Primary Care Provider: Girtha Rm, NP-C Cardiologist: Glenetta Hew, MD Electrophysiologist: None  Clinic Note: Chief Complaint  Patient presents with  . Follow-up    No real change in hip and muscle pain.  Doing okay being back on medicines  . Cardiomyopathy    HPI:    Genesi Stefanko is a 64 y.o. female with a PMH notable for NONISCHEMIC CARDIOMYOPATHY (EF improved from 18 up to 57 to 50%), HTN, DM-2, History of Breast Cancer who presents today for 1 month follow-up  2017 -> while living in [place], diagnosed with nonischemic cardiomyopathy-EF 18%.  (No report of cardiac catheterization); treated medically. => Follow-up echocardiogram in 2019 showed EF 45 to 50%. 2018: Breast cancer treated with lumpectomy and chemotherapy; had her port removed in July 2020.   Problem List Items Addressed This Visit    Chronic combined systolic and diastolic heart failure (HCC) (Chronic)    Definite improvement in EF.  She has combined CHF that is well controlled on current medications.   She restarted her carvedilol and Entresto along with low-dose Lasix,-->we are now restarting spironolactone.  Use hydralazine as as needed for elevated blood pressures.  We will check CMP and magnesium as well as calcium in roughly 2 weeks      Relevant Medications   furosemide (LASIX) 20 MG tablet   hydrALAZINE (APRESOLINE) 25 MG tablet   spironolactone (ALDACTONE) 25 MG tablet   Other Relevant Orders   EKG 12-Lead (Completed)   Calcium   Comprehensive metabolic panel   Magnesium   Essential hypertension (Chronic)    Blood pressure is a little bit high today.  I would prefer to avoid having multimedications, and for guideline directed therapy we will restart spironolactone. We will change hydralazine to as needed dosing only.      Relevant Medications   furosemide (LASIX) 20 MG tablet   hydrALAZINE (APRESOLINE) 25 MG tablet   spironolactone (ALDACTONE) 25 MG tablet   Other  Relevant Orders   Calcium   Comprehensive metabolic panel   Magnesium   Abnormal myocardial perfusion study (Chronic)    In the absence of symptoms of angina or worsening heart failure, the fact that her EF improved on medical management would argue against ischemic cardiomyopathy.  As such, we have not yet pursued ischemic evaluation.  Were she to have worsening symptoms I think coronary CT angiogram would be reasonable.      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  May be NYHA class II at worst symptoms.  Mostly 1-2 symptoms. She is doing better now that she is back on Entresto and carvedilol.  Blood pressures were getting quite high. Partly because of her cancer therapy and her own attempts at weight loss, her weight is now down 15 to 16 pounds from when I last saw her. She is not really having any edema, and is now on low-dose Lasix.  Plan: Continue carvedilol at current dose along with Entresto at next dose.  She had been on spironolactone -> with blood pressure still remains high, we will restart spironolactone 25 mg daily.    Change hydralazine to as needed only for SBP greater than 150 mmHg.  Can take up to 2 tablets a day  PRN additional 20 mg Lasix if weight increases more than 3 to 4 pounds for baseline dry weight.       Relevant Medications   furosemide (LASIX) 20 MG tablet  hydrALAZINE (APRESOLINE) 25 MG tablet   spironolactone (ALDACTONE) 25 MG tablet   Other Relevant Orders   EKG 12-Lead (Completed)   Calcium   Comprehensive metabolic panel   Magnesium   Coronary artery calcification seen on CAT scan (Chronic)    Previous evaluation suggested no occlusive disease.  EF improved on medical management. I do not have documented evidence of 2 ischemic evaluation, but with no anginal symptoms, no need to pursue further.  Plan will still be to target LDL less than 70.  Thankfully, her last lipid panel showed an LDL of 66  without being on any medications.  I suppose that weight loss has played a major part in this.      Relevant Medications   furosemide (LASIX) 20 MG tablet   hydrALAZINE (APRESOLINE) 25 MG tablet   spironolactone (ALDACTONE) 25 MG tablet   Obesity (BMI 30-39.9) (Chronic)    I think she definitely lost "obesity "weight more so than just fluid weight over the last 18 months.  I commended her on her efforts.  We have reset her dry weight based on today's weights.        I last saw her via telemedicine on May 07, 2019 -> was hoping to get back into the gym and exercise.  She was doing 5 days a week swimming.  But was gaining some weight that she had lost during her breast cancer treatment.  Edema remained stable.  No PND orthopnea.  Dyspnea is is better with using Lasix, but she felt that this made her more constipated and had to use enema.  Stable exertional dyspnea, but better. -> We set her new dry weight at 242 LB.  We talked about sliding scale Lasix dosing.  Plan was to increase Entresto to 97/103 mg.  Maddyn Lieurance Buice was last seen on November 5 by Kerin Ransom, PA as a 1 month follow-up.  Apparently she stopped taking several of her medications including a statin as well as Entresto and carvedilol thinking that they were causing hip pain..  At that visit, they decided that she would restart Entresto and carvedilol.  She felt as though she did not need Lasix because her weights were stable, nor did she want to restart spironolactone. ->  In follow-up visit she noted that she was taking NSAIDs for hip pain.  Blood pressure was poorly controlled 165/107 mmHg.  She self referred to a GI doctor for chronic constipation as she thought this may be causing her hip pain.; ->  Was counseled on avoiding excess use of NSAIDs.  Recommended using tramadol or Tylenol.  She also indicated that she has stopped taking diabetes medications because sugars were controlled. -> Was asked to resume Lasix 20 mg  daily and hydralazine 25 mg twice daily added.  Recent Hospitalizations: None  Reviewed  CV studies:    The following studies were reviewed today: (if available, images/films reviewed: From Epic Chart or Care Everywhere) . None:   Interval History:   Ndidi Nesby Woodford returns now for 1 month follow-up after seeing Lurena Joiner twice.  She says that there was really a change when she stopped all of her medications and therefore she did agree to restart most.  She was concerned about rabbit like stool and is going to see GI.  She also notes that she is not sleeping well. She still says that her legs ache, but is try to get back into doing activity.  She is not nearly as active as she  had been.  Thankfully though her weight has actually improved quite a bit since I last saw her.  She is leery of restarting Lasix because of the GI symptoms, and says that her been stable and she is not really had any edema.  The higher dose of Entresto seem to help.  She denies any PND orthopnea or edema symptoms consistent with heart failure.  CV Review of Symptoms (Summary): no chest pain or dyspnea on exertion positive for - Occasional palpitations, but no rapid heart rates. negative for - edema, orthopnea, paroxysmal nocturnal dyspnea, rapid heart rate, shortness of breath or Lightheadedness or dizziness, syncope/near syncope, TIA/amaurosis fugax, claudication  The patient does not have symptoms concerning for COVID-19 infection (fever, chills, cough, or new shortness of breath).   REVIEWED OF SYSTEMS   Review of Systems  Constitutional: Positive for weight loss. Negative for malaise/fatigue (Not a lot of energy).  HENT: Negative for congestion and sinus pain.   Respiratory: Negative for cough and shortness of breath.   Gastrointestinal: Positive for abdominal pain and constipation (Describes having firm pellet-like stool-described as rabbit pellets). Negative for blood in stool and melena.  Genitourinary:  Negative for hematuria.  Musculoskeletal: Positive for joint pain and myalgias (Her legs and thighs ache.  This is continued despite having stopped her statin as well as multiple on medications.  No change since restarting.). Negative for falls.  Neurological: Positive for headaches. Negative for dizziness and focal weakness.  Psychiatric/Behavioral: Negative for depression and memory loss. The patient is nervous/anxious. The patient does not have insomnia.    I have reviewed and (if needed) personally updated the patient's problem list, medications, allergies, past medical and surgical history, social and family history.   PAST MEDICAL HISTORY   Past Medical History:  Diagnosis Date  . Abnormal x-ray of lungs with single pulmonary nodule 03/15/2016   per records from [place]   . Anemia   . Asthma   . Cholelithiasis 05/19/2017   On CT  . Coronary artery calcification seen on CAT scan 05/19/2017  . Diabetes mellitus without complication ([place])    type 2  . 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 Diagnosed in [facility]  . Headache    NONE RECENT  . Hypertension   . Malignant neoplasm of lower-outer quadrant of left breast of female, estrogen receptor negative ([place]) 06/11/2017   left breast  . Mixed hyperlipidemia 06/17/2018  . partially blind   . Personal history of chemotherapy 2018-2019  . Personal history of radiation therapy 2019  . Retinitis pigmentosa   . Uveitis     PAST SURGICAL HISTORY   Past Surgical History:  Procedure Laterality Date  . brain cyst removed  2007   to help with headaches per patient , aspirated   . BREAST BIOPSY Left 06/06/2017   x2  . BREAST CYST ASPIRATION Left 06/06/2017  . BREAST LUMPECTOMY Left 06/26/2017  . BREAST LUMPECTOMY WITH RADIOACTIVE SEED AND SENTINEL LYMPH NODE BIOPSY Left 06/26/2017   Procedure: BREAST LUMPECTOMY WITH RADIOACTIVE SEED AND SENTINEL LYMPH NODE BIOPSY ERAS  PATHWAY;  Surgeon:  [name], MD;  Location: [facility];  Service: General;  Laterality: Left;  pec block  . FINE NEEDLE ASPIRATION Left 02/23/2019   Procedure: FINE NEEDLE ASPIRATION;  Surgeon: [name], MD;  Location: [facility];  Service: General;  Laterality: Left;  Asiration of left breast seroma   . NASAL TURBINATE REDUCTION    . PORT-A-CATH REMOVAL Right 02/23/2019  Procedure: REMOVAL PORT-A-CATH;  Surgeon: [name], MD;  Location: [facility];  Service: General;  Laterality: Right;  . PORTACATH PLACEMENT N/A 06/26/2017   Procedure: INSERTION PORT-A-CATH;  Surgeon: [name], MD;  Location: [facility];  Service: General;  Laterality: N/A;  . TRANSTHORACIC ECHOCARDIOGRAM  12/2015   A) [facility] May 2017: Mild concentric LVH. Global hypokinesis. GR daily. EF 18% severe LA dilation. Mitral annular dilatation with papillary muscle dysfunction and moderate MR. Dilated IVC consistent with elevated RAP.  [facility] Kitchen TRANSTHORACIC ECHOCARDIOGRAM  05/2017; 08/2017   a) Mild concentric LVH.  EF 45 to 50% with diffuse hypokinesis.  GR 1 DD.  Mild aortic root dilation.;; b)  Mild LVH EF 45 to 50%.  Diffuse HK.  No significant valve disease.  No significant change.     There is no immunization history on file for this patient.  MEDICATIONS/ALLERGIES   Current Meds  Medication Sig  . aspirin 81 MG chewable tablet Chew 81 mg by mouth daily.  . carvedilol (COREG) 12.5 MG tablet Take 1 tablet (12.5 mg total) by mouth 2 (two) times daily with a meal.  . Cholecalciferol (D3-1000 PO) Take by mouth.  . diclofenac (VOLTAREN) 75 MG EC tablet Take 1 tablet (75 mg total) by mouth 2 (two) times daily.  . furosemide (LASIX) 20 MG tablet Take 2 tablets (40 mg total) by mouth as needed. FOR WEIGHT GAIN  3 TO 4 LBS.  . hydrALAZINE (APRESOLINE) 25 MG tablet Take 1 tablet (25 mg total) by mouth every morning.  . linaclotide (LINZESS) 72 MCG capsule Take 1 capsule (72 mcg total) by mouth daily  before breakfast.  . metFORMIN (GLUCOPHAGE) 1000 MG tablet Take 1 tablet (1,000 mg total) by mouth 2 (two) times daily with a meal.  . Multiple Vitamins-Minerals (WOMENS MULTI PO) Take by mouth.   . oxyCODONE-acetaminophen (PERCOCET) 5-325 MG tablet Take 1-2 pills 30 minutes to one hour prior to MRI for pain control during the scan  . Saccharomyces boulardii (PROBIOTIC) 250 MG CAPS Take by mouth.  . sacubitril-valsartan (ENTRESTO) 97-103 MG Take 1 tablet by mouth 2 (two) times daily.  . Semaglutide (RYBELSUS) 3 MG TABS Take 3 mg by mouth daily with breakfast.  . traMADol (ULTRAM) 50 MG tablet Take 1-2 tabs po bid prn pain  . vitamin B-12 (CYANOCOBALAMIN) 500 MCG tablet Take 500 mcg by mouth daily.  . vitamin C (ASCORBIC ACID) 500 MG tablet Take 1,000 mg 2 (two) times daily by mouth.  . [DISCONTINUED] furosemide (LASIX) 20 MG tablet Take 1 tablet (20 mg total) by mouth daily.  . [DISCONTINUED] hydrALAZINE (APRESOLINE) 25 MG tablet Take 1 tablet (25 mg total) by mouth in the morning and at bedtime.    Allergies  Allergen Reactions  . Latex Hives and Itching    Burning   . Tylenol With Codeine #3 [Acetaminophen-Codeine] Nausea And Vomiting  . Penicillins Nausea Only, Swelling and Rash    Has patient had a PCN reaction causing immediate rash, facial/tongue/throat swelling, SOB or lightheadedness with hypotension: Yes Has patient had a PCN reaction causing severe rash involving mucus membranes or skin necrosis: Yes Has patient had a PCN reaction that required hospitalization: No Has patient had a PCN reaction occurring within the last 10 years: No TONGUE SWELLING AND RASH AROUND MOUTH If all of the above answers are "NO", then may proceed with Cephalosporin use.     SOCIAL HISTORY/FAMILY HISTORY   Reviewed in Epic:  Pertinent findings: No new findings  OBJCTIVE -PE, EKG, labs   Wt Readings from Last 3 Encounters:  07/10/20 226 lb 6.4 oz (102.7 kg)  06/23/20 224 lb (101.6 kg)   05/11/20 231 lb 9.6 oz (105.1 kg)    Physical Exam: BP 140/73   Pulse 74   Temp (!) 96.8 F (36 C)   Ht 5\' 7"  (1.702 m)   Wt 226 lb 6.4 oz (102.7 kg)   SpO2 93%   BMI 35.46 kg/m  Physical Exam Vitals reviewed.  Constitutional:      General: She is not in acute distress.    Appearance: She is obese. She is not ill-appearing or toxic-appearing.     Comments: Moderately obese.  Well-groomed.  Otherwise healthy-appearing.  HENT:     Head: Normocephalic and atraumatic.  Neck:     Vascular: No carotid bruit, hepatojugular reflux or JVD.  Cardiovascular:     Rate and Rhythm: Normal rate and regular rhythm.  No extrasystoles are present.    Chest Wall: PMI is not displaced (Unable to palpate).     Pulses: Intact distal pulses.     Heart sounds: S2 normal. Heart sounds are distant. Murmur (1/6 HSM at apex) heard.  No friction rub. No gallop.   Pulmonary:     Effort: Pulmonary effort is normal. No respiratory distress.     Breath sounds: Normal breath sounds.  Abdominal:     General: Bowel sounds are normal. There is no distension.     Palpations: Abdomen is soft. There is no mass (No HSM).  Musculoskeletal:        General: Swelling (Trivial pedal edema) present. Normal range of motion.     Cervical back: Normal range of motion and neck supple.  Neurological:     General: No focal deficit present.     Mental Status: She is alert and oriented to person, place, and time.  Psychiatric:        Mood and Affect: Mood normal.        Behavior: Behavior normal.        Thought Content: Thought content normal.        Judgment: Judgment normal.     Adult ECG Report  Rate: 74;  Rhythm: normal sinus rhythm and 1  AVB; borderline LVH.  Nonspecific ST and T wave changes.;  Otherwise normal axis, intervals and durations.  Narrative Interpretation: Stable EKG  Recent Labs: Reviewed Lab Results  Component Value Date   CHOL 114 01/06/2020   HDL 32 (L) 01/06/2020   LDLCALC 66 01/06/2020    TRIG 81 01/06/2020   CHOLHDL 3.6 01/06/2020   Lab Results  Component Value Date   CREATININE 0.79 06/23/2020   BUN 12 06/23/2020   NA 141 06/23/2020   K 4.5 06/23/2020   CL 107 06/23/2020   CO2 28 06/23/2020   Lab Results  Component Value Date   TSH 3.71 06/23/2020    ASSESSMENT/PLAN    Problem List Items Addressed This Visit    Chronic combined systolic and diastolic heart failure (HCC) (Chronic)    Definite improvement in EF.  She has combined CHF that is well controlled on current medications.   She restarted her carvedilol and Entresto along with low-dose Lasix,-->we are now restarting spironolactone.  Use hydralazine as as needed for elevated blood pressures.  We will check CMP and magnesium as well as calcium in roughly 2 weeks      Relevant Medications   furosemide (LASIX) 20 MG tablet   hydrALAZINE (APRESOLINE) 25  MG tablet   spironolactone (ALDACTONE) 25 MG tablet   Other Relevant Orders   EKG 12-Lead (Completed)   Calcium   Comprehensive metabolic panel   Magnesium   Essential hypertension (Chronic)    Blood pressure is a little bit high today.  I would prefer to avoid having multimedications, and for guideline directed therapy we will restart spironolactone. We will change hydralazine to as needed dosing only.      Relevant Medications   furosemide (LASIX) 20 MG tablet   hydrALAZINE (APRESOLINE) 25 MG tablet   spironolactone (ALDACTONE) 25 MG tablet   Other Relevant Orders   Calcium   Comprehensive metabolic panel   Magnesium   Abnormal myocardial perfusion study (Chronic)    In the absence of symptoms of angina or worsening heart failure, the fact that her EF improved on medical management would argue against ischemic cardiomyopathy.  As such, we have not yet pursued ischemic evaluation.  Were she to have worsening symptoms I think coronary CT angiogram would be reasonable.      Dilated cardiomyopathy ([place]) - Primary (Chronic)    She seems to  doing pretty well.  May be NYHA class II at worst symptoms.  Mostly 1-2 symptoms. She is doing better now that she is back on Entresto and carvedilol.  Blood pressures were getting quite high. Partly because of her cancer therapy and her own attempts at weight loss, her weight is now down 15 to 16 pounds from when I last saw her. She is not really having any edema, and is now on low-dose Lasix.  Plan: Continue carvedilol at current dose along with Entresto at next dose.  She had been on spironolactone -> with blood pressure still remains high, we will restart spironolactone 25 mg daily.    Change hydralazine to as needed only for SBP greater than 150 mmHg.  Can take up to 2 tablets a day  PRN additional 20 mg Lasix if weight increases more than 3 to 4 pounds for baseline dry weight.       Relevant Medications   furosemide (LASIX) 20 MG tablet   hydrALAZINE (APRESOLINE) 25 MG tablet   spironolactone (ALDACTONE) 25 MG tablet   Other Relevant Orders   EKG 12-Lead (Completed)   Calcium   Comprehensive metabolic panel   Magnesium   Coronary artery calcification seen on CAT scan (Chronic)    Previous evaluation suggested no occlusive disease.  EF improved on medical management. I do not have documented evidence of 2 ischemic evaluation, but with no anginal symptoms, no need to pursue further.  Plan will still be to target LDL less than 70.  Thankfully, her last lipid panel showed an LDL of 66 without being on any medications.  I suppose that weight loss has played a major part in this.      Relevant Medications   furosemide (LASIX) 20 MG tablet   hydrALAZINE (APRESOLINE) 25 MG tablet   spironolactone (ALDACTONE) 25 MG tablet   Obesity (BMI 30-39.9) (Chronic)    I think she definitely lost "obesity "weight more so than just fluid weight over the last 18 months.  I commended her on her efforts.  We have reset her dry weight based on today's weights.         COVID-19  Education: The signs and symptoms of COVID-19 were discussed with the patient and how to seek care for testing (follow up with PCP or arrange E-visit).   The importance of social distancing and COVID-19  vaccination was discussed today. 1 min The patient is practicing social distancing & Masking.   I spent a total of 29minutes with the patient spent in direct patient consultation.  Additional time spent with chart review  / charting (studies, outside notes, etc): 15 -> has been 18 months since I last saw her.  Has been seen by PA twice and has had breast cancer treatment.  Notes and studies were reviewed. Total Time: 41 min   Current medicines are reviewed at length with the patient today.  (+/- concerns) n/a  This visit occurred during the SARS-CoV-2 public health emergency.  Safety protocols were in place, including screening questions prior to the visit, additional usage of staff PPE, and extensive cleaning of exam room while observing appropriate contact time as indicated for disinfecting solutions.  Notice: This dictation was prepared with Dragon dictation along with smaller phrase technology. Any transcriptional errors that result from this process are unintentional and may not be corrected upon review.  Patient Instructions / Medication Changes & Studies & Tests Ordered   Patient Instructions  Medication Instructions:    CHANGE TO HYDRALAZINE TO TAKE AS NEEDED  ONE TO TWO TIMES A DAY FOR BLOOD PRESSURE  332 SYSTOLIC BLOOD PRESSURE.   RESTART TAKING SPIRONOLACTONE 25 MG DAILY   MAY TAKE LASIX 40 MG  IF WEIGHT IS 3 TO 4 LBS HEAVIER OVERNIGHT- TAKE AS NEEDED  *If you need a refill on your cardiac medications before your next appointment, please call your pharmacy*   Lab Work:  [place] 2 WEEKS  If you have labs (blood work) drawn today and your tests are completely normal, you will receive your results only by: Marland Kitchen MyChart Message (if you have MyChart) OR . A  paper copy in the mail If you have any lab test that is abnormal or we need to change your treatment, we will call you to review the results.   Testing/Procedures: Not needed  Follow-Up: At [facility], you and your health needs are our priority.  As part of our continuing mission to provide you with exceptional heart care, we have created designated Provider Care Teams.  These Care Teams include your primary Cardiologist (physician) and Advanced Practice Providers (APPs -  Physician Assistants and Nurse Practitioners) who all work together to provide you with the care you need, when you need it.     Your next appointment:   4 month(s)  The format for your next appointment:   In Person  Provider:   Glenetta Hew, MD   Other Instructions    Studies Ordered:   Orders Placed This Encounter  Procedures  . Calcium  . Comprehensive metabolic panel  . Magnesium  . EKG 12-Lead     Glenetta Hew, M.D., M.S. Interventional Cardiologist   Pager # [phone] Phone # [phone] [address] [place], [place] [address]   Thank you for choosing Heartcare at [facility]!!

## 2020-07-11 ENCOUNTER — Telehealth: Payer: Self-pay | Admitting: Internal Medicine

## 2020-07-11 DIAGNOSIS — M25551 Pain in right hip: Secondary | ICD-10-CM

## 2020-07-11 DIAGNOSIS — M79673 Pain in unspecified foot: Secondary | ICD-10-CM

## 2020-07-11 DIAGNOSIS — G8929 Other chronic pain: Secondary | ICD-10-CM

## 2020-07-11 NOTE — Telephone Encounter (Signed)
Pt was advised to take Voltaren and tylenol twice a day and try that but can't take Voltaren and ibuprofen together. If that doesn't work, call ortho

## 2020-07-11 NOTE — Telephone Encounter (Signed)
Ok, I am sorry about that.  We had a lengthy discussion about this in the office.  If she would rather repeat cortisone injection in her right hip prior to MRI, we can do that

## 2020-07-11 NOTE — Telephone Encounter (Signed)
Left message for pt to call me back 

## 2020-07-11 NOTE — Telephone Encounter (Signed)
Let her know that I reviewed the notes from her orthopedist office and they discussed another injection while she is waiting to get her MRI if she would like.  She may want to consider doing this. I cannot treat chronic pain

## 2020-07-11 NOTE — Telephone Encounter (Signed)
Pt states she is in severe pain with her hip, ortho told her to contact us as they can not give her oxycodone until she is scheduled for MRI. She took 3 voltaren tablets and 2 ibuprofen for the pain which helped when taken together but she can't deal with the pain. Please advise.

## 2020-07-11 NOTE — Telephone Encounter (Signed)
FYI  I called pt and advised of message below. She states that we need to do something about her pain now. I advised that she can contact her PCP but can not give anything from our office except for the day before her MRI. Pt hung up the phone.

## 2020-07-11 NOTE — Telephone Encounter (Signed)
She should avoid taking diclofenac and ibuprofen since these are both NSAIDs and can cause GI bleeding if taken too often or together. I recommend taking one of the other and also adding Tylenol 1000 mg twice daily.  I spoke with my supervising physician and since she has an orthopedist who is treating her for her hip pain and recently prescribed her oxycodone, she will need to follow-up with him.  It would not be appropriate at this point for me to jump in the mix and treat her hip pain.

## 2020-07-11 NOTE — Telephone Encounter (Signed)
Per notes pt contacted pcp for pain meds

## 2020-07-11 NOTE — Telephone Encounter (Signed)
Pt is calling back and states that voltaren and tylelnol together did not help at all. She wants know if anything else she can try. Ortho is sending her back to pcp for the pain. She would like a referral to pain clinic but I advised her it could take a while to get in if we did. Pt can't take the pain and the only thing that worked was takine voltaren and ibuprofen together

## 2020-07-12 DIAGNOSIS — H3552 Pigmentary retinal dystrophy: Secondary | ICD-10-CM | POA: Diagnosis not present

## 2020-07-12 DIAGNOSIS — H2513 Age-related nuclear cataract, bilateral: Secondary | ICD-10-CM | POA: Diagnosis not present

## 2020-07-12 DIAGNOSIS — H209 Unspecified iridocyclitis: Secondary | ICD-10-CM | POA: Diagnosis not present

## 2020-07-12 DIAGNOSIS — E119 Type 2 diabetes mellitus without complications: Secondary | ICD-10-CM | POA: Diagnosis not present

## 2020-07-12 LAB — HM DIABETES EYE EXAM

## 2020-07-12 NOTE — Addendum Note (Signed)
Addended by: Minette Headland A on: 07/12/2020 09:15 AM   Modules accepted: Orders

## 2020-07-12 NOTE — Telephone Encounter (Signed)
Pt is not having foot pain. She needs something for pain and I advised her we could not give her anything but we could refer her to the pain clinic but it might be a while before she got in. I advised her she could be seen for her foot pain to evaluate her to see if something else was going on. She declined and only wanted referral to pain clinic

## 2020-07-18 NOTE — Progress Notes (Signed)
Reviewed and agree with documentation and assessment and plan. K. Veena Cassia Fein , MD   

## 2020-07-19 ENCOUNTER — Telehealth: Payer: Self-pay | Admitting: Orthopaedic Surgery

## 2020-07-19 ENCOUNTER — Encounter: Payer: Self-pay | Admitting: Physical Medicine and Rehabilitation

## 2020-07-19 MED ORDER — OXYCODONE-ACETAMINOPHEN 5-325 MG PO TABS
1.0000 | ORAL_TABLET | Freq: Once | ORAL | 0 refills | Status: AC
Start: 2020-07-19 — End: 2020-07-19

## 2020-07-19 NOTE — Telephone Encounter (Signed)
Patient called advised she is having the MRI 07/23/2020 and need a Rx for Oxycodone prior to her appointment. The number to contact patient is [phone]

## 2020-07-19 NOTE — Telephone Encounter (Signed)
I sent in 2 tablets

## 2020-07-19 NOTE — Telephone Encounter (Signed)
Patient aware.

## 2020-07-21 ENCOUNTER — Encounter: Payer: Self-pay | Admitting: Internal Medicine

## 2020-07-21 ENCOUNTER — Encounter: Payer: Self-pay | Admitting: Cardiology

## 2020-07-21 ENCOUNTER — Encounter: Payer: Self-pay | Admitting: Family Medicine

## 2020-07-21 NOTE — Assessment & Plan Note (Addendum)
She seems to doing pretty well.  May be NYHA class II at worst symptoms.  Mostly 1-2 symptoms. She is doing better now that she is back on Entresto and carvedilol.  Blood pressures were getting quite high. Partly because of her cancer therapy and her own attempts at weight loss, her weight is now down 15 to 16 pounds from when I last saw her. She is not really having any edema, and is now on low-dose Lasix.  Plan: Continue carvedilol at current dose along with Entresto at next dose.  She had been on spironolactone -> with blood pressure still remains high, we will restart spironolactone 25 mg daily.    Change hydralazine to as needed only for SBP greater than 150 mmHg.  Can take up to 2 tablets a day  PRN additional 20 mg Lasix if weight increases more than 3 to 4 pounds for baseline dry weight.

## 2020-07-21 NOTE — Assessment & Plan Note (Signed)
Blood pressure is a little bit high today.  I would prefer to avoid having multimedications, and for guideline directed therapy we will restart spironolactone. We will change hydralazine to as needed dosing only.

## 2020-07-21 NOTE — Assessment & Plan Note (Signed)
In the absence of symptoms of angina or worsening heart failure, the fact that her EF improved on medical management would argue against ischemic cardiomyopathy.  As such, we have not yet pursued ischemic evaluation.  Were she to have worsening symptoms I think coronary CT angiogram would be reasonable.

## 2020-07-21 NOTE — Assessment & Plan Note (Signed)
Previous evaluation suggested no occlusive disease.  EF improved on medical management. I do not have documented evidence of 2 ischemic evaluation, but with no anginal symptoms, no need to pursue further.  Plan will still be to target LDL less than 70.  Thankfully, her last lipid panel showed an LDL of 66 without being on any medications.  I suppose that weight loss has played a major part in this.

## 2020-07-21 NOTE — Assessment & Plan Note (Signed)
Definite improvement in EF.  She has combined CHF that is well controlled on current medications.   She restarted her carvedilol and Entresto along with low-dose Lasix,-->we are now restarting spironolactone.  Use hydralazine as as needed for elevated blood pressures.  We will check CMP and magnesium as well as calcium in roughly 2 weeks

## 2020-07-21 NOTE — Assessment & Plan Note (Signed)
I think she definitely lost "obesity "weight more so than just fluid weight over the last 18 months.  I commended her on her efforts.  We have reset her dry weight based on today's weights.

## 2020-07-23 ENCOUNTER — Ambulatory Visit
Admission: RE | Admit: 2020-07-23 | Discharge: 2020-07-23 | Disposition: A | Discharge status: Home / Self Care | Payer: Medicare HMO | Source: Ambulatory Visit | Attending: Orthopaedic Surgery | Admitting: Orthopaedic Surgery

## 2020-07-23 ENCOUNTER — Other Ambulatory Visit: Payer: Self-pay

## 2020-07-23 DIAGNOSIS — M545 Low back pain, unspecified: Secondary | ICD-10-CM

## 2020-07-23 DIAGNOSIS — M48061 Spinal stenosis, lumbar region without neurogenic claudication: Secondary | ICD-10-CM | POA: Diagnosis not present

## 2020-07-23 DIAGNOSIS — M25551 Pain in right hip: Secondary | ICD-10-CM

## 2020-07-23 DIAGNOSIS — M79604 Pain in right leg: Secondary | ICD-10-CM

## 2020-07-24 ENCOUNTER — Telehealth: Payer: Self-pay | Admitting: Internal Medicine

## 2020-07-24 ENCOUNTER — Telehealth: Payer: Self-pay | Admitting: Orthopaedic Surgery

## 2020-07-24 NOTE — Telephone Encounter (Signed)
I called to discuss but was advised needed to speak directly with provider.

## 2020-07-24 NOTE — Telephone Encounter (Signed)
Olivia Mackie with Radiology called wanting to give results for the pts MRI and would like a CB      [phone]

## 2020-07-24 NOTE — Telephone Encounter (Signed)
Pt left a vm that she wanted vickie to look over test results and let her know what was going on. I called and left vm that vickie was not in the office until Wednesday but she needed to contact the provider that ordered the test to let her know what the results were.

## 2020-07-25 ENCOUNTER — Encounter: Payer: Self-pay | Admitting: Family Medicine

## 2020-07-25 ENCOUNTER — Telehealth: Payer: Self-pay | Admitting: Internal Medicine

## 2020-07-25 ENCOUNTER — Other Ambulatory Visit: Payer: Self-pay | Admitting: Family Medicine

## 2020-07-25 DIAGNOSIS — R599 Enlarged lymph nodes, unspecified: Secondary | ICD-10-CM

## 2020-07-25 DIAGNOSIS — R590 Localized enlarged lymph nodes: Secondary | ICD-10-CM

## 2020-07-25 DIAGNOSIS — R102 Pelvic and perineal pain: Secondary | ICD-10-CM

## 2020-07-25 HISTORY — DX: Enlarged lymph nodes, unspecified: R59.9

## 2020-07-25 HISTORY — DX: Localized enlarged lymph nodes: R59.0

## 2020-07-25 NOTE — Telephone Encounter (Signed)
Left message for pt to call back  Patient is scheduled on January 4th @ 3:00pm for CT at [facility].  She is to pick up the contrast a couple days prior. They will be closed Dec 31st and January 3rd due for new years.

## 2020-07-25 NOTE — Telephone Encounter (Signed)
Ok, thanks.

## 2020-07-25 NOTE — Telephone Encounter (Signed)
I have submitted to PCP and they will reach out to patient. I also scheduled appt with Dr Erlinda Hong to review scan

## 2020-07-25 NOTE — Telephone Encounter (Signed)
-----   Message from Girtha Rm, NP-C sent at 07/25/2020 10:38 AM EST ----- Please contact patient and let her know that she has several enlarged lymph nodes in her abdomen/pelvic regions per her MRI and I am ordering a CT to evaluate these better. She will also need to follow up with Dr. Erlinda Hong for the results on the MRI dealing with her back and leg pain. The order is placed. Please see that this is taken care of for her. Thanks.  ----- Message ----- From: Laurann Montana, RT Sent: 07/25/2020   9:31 AM EST To: Girtha Rm, NP-C

## 2020-07-25 NOTE — Telephone Encounter (Signed)
Offered her appt this week but patient refused stating she is out of town.  Scheduled her for next Tuesday.

## 2020-07-26 NOTE — Telephone Encounter (Signed)
Pt was notified of instructions for CT and to pick up contrast days prior

## 2020-08-01 ENCOUNTER — Ambulatory Visit (INDEPENDENT_AMBULATORY_CARE_PROVIDER_SITE_OTHER): Payer: Medicare HMO | Admitting: Orthopaedic Surgery

## 2020-08-01 ENCOUNTER — Encounter: Payer: Self-pay | Admitting: Orthopaedic Surgery

## 2020-08-01 ENCOUNTER — Other Ambulatory Visit: Payer: Self-pay

## 2020-08-01 VITALS — Ht 67.0 in | Wt 226.0 lb

## 2020-08-01 DIAGNOSIS — M79604 Pain in right leg: Secondary | ICD-10-CM

## 2020-08-01 DIAGNOSIS — M25551 Pain in right hip: Secondary | ICD-10-CM

## 2020-08-01 MED ORDER — IBUPROFEN 800 MG PO TABS: 800.0000 mg | ORAL_TABLET | Freq: Three times a day (TID) | ORAL | 2 refills | Status: DC | PRN

## 2020-08-01 MED ORDER — OXYCODONE-ACETAMINOPHEN 5-325 MG PO TABS: ORAL_TABLET | ORAL | 0 refills | Status: DC

## 2020-08-01 NOTE — Addendum Note (Signed)
Addended by: Lendon Collar on: 08/01/2020 08:54 AM   Modules accepted: Orders

## 2020-08-01 NOTE — Progress Notes (Signed)
Office Visit Note   Patient: Kristin Ward           Date of Birth: [birth year]           MRN: [id number] Visit Date: 08/01/2020              Requested by: Girtha Rm, NP-C [place],  [place] [id number] PCP: Girtha Rm, NP-C   Assessment & Plan: Visit Diagnoses:  1. Pain in right hip   2. Pain in right leg     Plan: Impression is chronic low back pain and right hip pain.  MRI shows facet disease at multiple levels.  There is no spinal stenosis or foraminal stenosis.  Prescription for Percocet and ibuprofen refilled today.  We will see her back after the MR arthrogram of the right hip.  She requests a referral to rheumatology for total body joint pain.  Arthritis panel obtained today.  Follow-Up Instructions: Return if symptoms worsen or fail to improve.   Orders:  No orders of the defined types were placed in this encounter.  Meds ordered this encounter  Medications  . oxyCODONE-acetaminophen (PERCOCET) 5-325 MG tablet    Sig: Take 1-2 pills 30 minutes to one hour prior to MRI for pain control during the scan    Dispense:  6 tablet    Refill:  0  . ibuprofen (ADVIL) 800 MG tablet    Sig: Take 1 tablet (800 mg total) by mouth every 8 (eight) hours as needed.    Dispense:  30 tablet    Refill:  2      Procedures: No procedures performed   Clinical Data: No additional findings.   Subjective: Chief Complaint  Patient presents with  . Spine - Follow-up    MRI review    Kristin Ward returns today for MRI review.  She is only able to get through the lumbar spine MRI due to pain.  She has rescheduled her right hip MRI for January.  She also has a CT scan of the abdomen scheduled for early January due to incidental finding of enlarged lymph nodes.  She reports no difference in her pain.  Continues to have right-sided lower back pain that radiates down her right leg.   Review of Systems  Constitutional: Negative.   HENT: Negative.   Eyes:  Negative.   Respiratory: Negative.   Cardiovascular: Negative.   Endocrine: Negative.   Musculoskeletal: Negative.   Neurological: Negative.   Hematological: Negative.   Psychiatric/Behavioral: Negative.   All other systems reviewed and are negative.    Objective: Vital Signs: Ht 5\' 7"  (1.702 m)   Wt 226 lb (102.5 kg)   BMI 35.40 kg/m   Physical Exam Vitals and nursing note reviewed.  Constitutional:      Appearance: She is well-developed and well-nourished.  Pulmonary:     Effort: Pulmonary effort is normal.  Skin:    General: Skin is warm.     Capillary Refill: Capillary refill takes less than 2 seconds.  Neurological:     Mental Status: She is alert and oriented to person, place, and time.  Psychiatric:        Mood and Affect: Mood and affect normal.        Behavior: Behavior normal.        Thought Content: Thought content normal.        Judgment: Judgment normal.     Ortho Exam Lumbar spine and right hip exams are unchanged. Specialty Comments:  No specialty comments available.  Imaging: No results found.   PMFS History: Patient Active Problem List   Diagnosis Date Noted  . Lymph nodes enlarged 07/25/2020  . Retroperitoneal lymphadenopathy 07/25/2020  . History of breast cancer 05/11/2020  . 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 Chronic right hip pain 04/05/2020  . Insulin dependent type 1 diabetes mellitus ([facility]) 10/21/2019  . Vitamin D deficiency 10/07/2019  . Elevated serum creatinine 10/07/2019  . Diabetes mellitus, new onset ([facility]) 01/20/2019  . Mixed hyperlipidemia 06/17/2018  . Influenza vaccination declined 06/17/2018  . Obesity (BMI 30-39.9) 06/17/2018  . Port-A-Cath in place 09/08/2017  . Other fatigue 08/29/2017  . Anemia 08/29/2017  . High risk medication use 08/29/2017  . Syncope 08/25/2017  . Blind in both eyes 08/25/2017  . Genetic testing 06/19/2017  . Family history of colon cancer   . Malignant neoplasm of lower-outer  quadrant of left breast of female, estrogen receptor negative ([facility]) 06/11/2017  . Cholelithiasis 05/19/2017  . Coronary artery calcification seen on CAT scan 05/19/2017  . Low grade squamous intraepith lesion on cytologic smear cervix ([place]) 04/09/2017  . HPV (human papilloma virus) infection 04/09/2017  . COPD (chronic obstructive pulmonary disease) ([facility])   . Essential hypertension 03/20/2017  . Screen for colon cancer 03/20/2017  . Retinitis pigmentosa   . Blind   . Solitary pulmonary nodule on lung CT 03/16/2016  . Abnormal myocardial perfusion study 03/15/2016  . Chronic combined systolic and diastolic heart failure ([facility]) 12/28/2015  . Dilated cardiomyopathy ([place]) 12/27/2015   Past Medical History:  Diagnosis Date  . Abnormal x-ray of lungs with single pulmonary nodule 03/15/2016   per records from [place]   . Anemia   . Asthma   . Cholelithiasis 05/19/2017   On CT  . Coronary artery calcification seen on CAT scan 05/19/2017  . Diabetes mellitus without complication ([facility])    type 2  . 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 Diagnosed in [facility]  . Headache    NONE RECENT  . Hypertension   . Lymph nodes enlarged 07/25/2020  . Malignant neoplasm of lower-outer quadrant of left breast of female, estrogen receptor negative ([facility]) 06/11/2017   left breast  . Mixed hyperlipidemia 06/17/2018  . partially blind   . Personal history of chemotherapy 2018-2019  . Personal history of radiation therapy 2019  . Retinitis pigmentosa   . Retroperitoneal lymphadenopathy 07/25/2020  . Uveitis     Family History  Problem Relation Age of Onset  . Colon cancer Mother 64       deceased 46  . Heart attack Father   . Breast cancer Other 42       2nd cousin on maternal side; currently 63  . Pancreatic cancer Other        2nd cousin once removed on maternal side; deceased 73s  . Fibromyalgia Sister   . Esophageal cancer Neg Hx   . Rectal cancer  Neg Hx   . Stomach cancer Neg Hx     Past Surgical History:  Procedure Laterality Date  . brain cyst removed  2007   to help with headaches per patient , aspirated   . BREAST BIOPSY Left 06/06/2017   x2  . BREAST CYST ASPIRATION Left 06/06/2017  . BREAST LUMPECTOMY Left 06/26/2017  . BREAST LUMPECTOMY WITH RADIOACTIVE SEED AND SENTINEL LYMPH NODE BIOPSY Left 06/26/2017   Procedure: BREAST LUMPECTOMY WITH RADIOACTIVE SEED AND SENTINEL LYMPH NODE BIOPSY ERAS  PATHWAY;  Surgeon:  [name], MD;  Location: [facility];  Service: General;  Laterality: Left;  pec block  . FINE NEEDLE ASPIRATION Left 02/23/2019   Procedure: FINE NEEDLE ASPIRATION;  Surgeon: [name], MD;  Location: [facility];  Service: General;  Laterality: Left;  Asiration of left breast seroma   . NASAL TURBINATE REDUCTION    . PORT-A-CATH REMOVAL Right 02/23/2019   Procedure: REMOVAL PORT-A-CATH;  Surgeon: [name], MD;  Location: [facility];  Service: General;  Laterality: Right;  . PORTACATH PLACEMENT N/A 06/26/2017   Procedure: INSERTION PORT-A-CATH;  Surgeon: [name], MD;  Location: [facility];  Service: General;  Laterality: N/A;  . TRANSTHORACIC ECHOCARDIOGRAM  12/2015   A) [facility] May 2017: Mild concentric LVH. Global hypokinesis. GR daily. EF 18% severe LA dilation. Mitral annular dilatation with papillary muscle dysfunction and moderate MR. Dilated IVC consistent with elevated RAP.  [facility] TRANSTHORACIC ECHOCARDIOGRAM  05/2017; 08/2017   a) Mild concentric LVH.  EF 45 to 50% with diffuse hypokinesis.  GR 1 DD.  Mild aortic root dilation.;; b)  Mild LVH EF 45 to 50%.  Diffuse HK.  No significant valve disease.  No significant change.   Social History   Occupational History  . Not on file  Tobacco Use  . Smoking status: Former Smoker    Packs/day: 0.25    Years: 23.00    Pack years: 5.75    Types: Cigarettes    Quit date: 10/09/2015    Years since quitting: 4.8  .  Smokeless tobacco: Never Used  Vaping Use  . Vaping Use: Never used  Substance and Sexual Activity  . Alcohol use: No  . Drug use: No  . Sexual activity: Never

## 2020-08-01 NOTE — Addendum Note (Signed)
Addended by: Minda Ditto, Geoffery Spruce on: 08/01/2020 08:38 AM   Modules accepted: Orders

## 2020-08-03 LAB — URIC ACID: Uric Acid, Serum: 4.7 mg/dL (ref 2.5–7.0)

## 2020-08-03 LAB — ANTI-NUCLEAR AB-TITER (ANA TITER): ANA Titer 1: 1:320 {titer} — ABNORMAL HIGH

## 2020-08-03 LAB — RHEUMATOID FACTOR: Rheumatoid fact SerPl-aCnc: <14 IU/mL (ref <14)

## 2020-08-03 LAB — ANA: Anti Nuclear Antibody (ANA): POSITIVE — AB

## 2020-08-03 LAB — SEDIMENTATION RATE: Sed Rate: 48 mm/h — ABNORMAL HIGH (ref 0–30)

## 2020-08-03 NOTE — Progress Notes (Signed)
Please refer patient to rheumatology.  Thanks.

## 2020-08-07 ENCOUNTER — Other Ambulatory Visit: Payer: Self-pay

## 2020-08-07 DIAGNOSIS — M25551 Pain in right hip: Secondary | ICD-10-CM

## 2020-08-14 NOTE — Progress Notes (Signed)
[place]   [phone] [phone]   Clinic Follow up Note   Patient Care Team: Girtha Rm, NP-C as PCP - General (Family Medicine) [name], MD as PCP - Cardiology (Cardiology) [name], MD as Consulting Physician (Hematology) [name], MD as Consulting Physician (General Surgery)  Date of Service:  08/16/2020  CHIEF COMPLAINT: f/u ofleft breastcancer, triple negative  SUMMARY OF ONCOLOGIC HISTORY: Oncology History Overview Note  Cancer Staging Malignant neoplasm of lower-outer quadrant of left breast of female, estrogen receptor negative ([place]) Staging form: Breast, AJCC 8th Edition - Clinical stage from 06/06/2017: Stage IB (cT1c, cN0, cM0, G3, ER: Negative, PR: Negative, HER2: Negative) - Signed by [name], MD on 06/15/2017 - Pathologic stage from 06/26/2017: Stage IB (pT1c, pN0, cM0, G3, ER: Negative, PR: Negative, HER2: Negative) - Signed by [name], MD on 07/10/2017     Malignant neoplasm of lower-outer quadrant of left breast of female, estrogen receptor negative ([place])  05/30/2017 Mammogram   Diagnostic mammo and US IMPRESSION: 1. Highly suspicious 1.4 cm mass in the slightly lower slightly outer left breast -tissue sampling recommended. 2. Indeterminate 0.5 mm mass in the slightly lower slightly outer left breast -tissue sampling recommended. 3. At least 2 left axillary lymph nodes with borderline cortical thickness.   06/06/2017 Initial Biopsy   Diagnosis 1. Breast, left, needle core biopsy, 5:30 o'clock - INVASIVE DUCTAL CARCINOMA, G3 2. Lymph node, needle/core biopsy, left axillary - NO CARCINOMA IDENTIFIED IN ONE LYMPH NODE (0/1)   06/06/2017 Initial Diagnosis   Malignant neoplasm of lower-outer quadrant of left breast of female, estrogen receptor negative ([place])   06/06/2017 Receptors her2   Estrogen Receptor: 0%, NEGATIVE Progesterone Receptor: 0%, NEGATIVE Proliferation Marker Ki67: 70%    06/26/2017 Surgery   LEFT BREAST LUMPECTOMY WITH RADIOACTIVE SEED AND SENTINEL LYMPH NODE BIOPSY ERAS  PATHWAY AND INSERTION PORT-A-CATH By Dr. Barry Dienes on 06/26/17    06/26/2017 Pathology Results   Diagnosis 06/26/17  1. Breast, lumpectomy, Left - INVASIVE DUCTAL CARCINOMA, GRADE III/III, SPANNING 1.2 CM. - THE SURGICAL RESECTION MARGINS ARE NEGATIVE FOR CARCINOMA. - SEE ONCOLOGY TABLE BELOW. 2. Lymph node, sentinel, biopsy, Left axillary #1 - THERE IS NO EVIDENCE OF CARCINOMA IN 1 OF 1 LYMPH NODE (0/1). 3. Lymph node, sentinel, biopsy, Left axillary #2 - THERE IS NO EVIDENCE OF CARCINOMA IN 1 OF 1 LYMPH NODE (0/1). 4. Lymph node, sentinel, biopsy, Left axillary #3 - THERE IS NO EVIDENCE OF CARCINOMA IN 1 OF 1 LYMPH NODE (0/1).    07/25/2017 - 09/29/2017 Chemotherapy   Adjuvant cytoxan and docetaxel (TC) every 3 weekd for 4 cycles     11/19/2017 - 12/17/2017 Radiation Therapy   Adjuvant breast radiation Left breast treated to 42.5 Gy with 17 fx of 2.5 Gy followed by a boost of 7.5 Gy with 3 fx of 2.5 Gy     02/2019 Procedure   She had PAC removal in 02/2019.       CURRENT THERAPY:  Surveillance  INTERVAL HISTORY:  Kristin Ward is here for a follow up of left breast cancer. She was last seen by me 1 year ago and seen by NP Lacie 6 months ago. She presents to the clinic alone.   She notes having right hip pain in 2020 then stopped. This recurred in 2021 and continued. She has been seeing Ortho Dr Erlinda Hong who said she has arthritis. She notes this pain has worsened and radiated down her right leg. She  had MRI Lumbar spine. There was incidental findings in on MRI so she had CT AP yesterday. She notes her BM have been hard stool and she gets pain with stool formation. She has been offered Linzess by her Dr Silverio Decamp. She notes weakness of her right leg.  I reviewed her medication list with her. She is no longer on DM medication. She is on spironolactone, no longer on hydralazine. She  notes Tramadol did not work for her.     REVIEW OF SYSTEMS:   Constitutional: Denies fevers, chills or abnormal weight loss Eyes: Denies blurriness of vision Ears, nose, mouth, throat, and face: Denies mucositis or sore throat Respiratory: Denies cough, dyspnea or wheezes Cardiovascular: Denies palpitation, chest discomfort or lower extremity swelling Gastrointestinal:  Denies nausea, heartburn or change in bowel habits (+) Hard stool  Skin: Denies abnormal skin rashes  MSK: (+) right hip pain radiating down right leg (+) buttock pain  Lymphatics: Denies new lymphadenopathy or easy bruising Neurological:Denies numbness, tingling (+) Left leg weakness Behavioral/Psych: Mood is stable, no new changes  All other systems were reviewed with the patient and are negative.  MEDICAL HISTORY:  Past Medical History:  Diagnosis Date  . Abnormal x-ray of lungs with single pulmonary nodule 03/15/2016   per records from [place]   . Anemia   . Asthma   . Cholelithiasis 05/19/2017   On CT  . Coronary artery calcification seen on CAT scan 05/19/2017  . Diabetes mellitus without complication ([place])    type 2  . Dilated idiopathic cardiomyopathy ([place]) 12/2015   EF 15-20%. Diagnosed in [facility]  . Headache    NONE RECENT  . Hypertension   . Lymph nodes enlarged 07/25/2020  . Malignant neoplasm of lower-outer quadrant of left breast of female, estrogen receptor negative ([place]) 06/11/2017   left breast  . Mixed hyperlipidemia 06/17/2018  . partially blind   . Personal history of chemotherapy 2018-2019  . Personal history of radiation therapy 2019  . Retinitis pigmentosa   . Retroperitoneal lymphadenopathy 07/25/2020  . Uveitis     SURGICAL HISTORY: Past Surgical History:  Procedure Laterality Date  . brain cyst removed  2007   to help with headaches per patient , aspirated   . BREAST BIOPSY Left 06/06/2017   x2  . BREAST CYST ASPIRATION Left 06/06/2017  . BREAST  LUMPECTOMY Left 06/26/2017  . BREAST LUMPECTOMY WITH RADIOACTIVE SEED AND SENTINEL LYMPH NODE BIOPSY Left 06/26/2017   Procedure: BREAST LUMPECTOMY WITH RADIOACTIVE SEED AND SENTINEL LYMPH NODE BIOPSY ERAS  PATHWAY;  Surgeon: [name], MD;  Location: [facility];  Service: General;  Laterality: Left;  pec block  . FINE NEEDLE ASPIRATION Left 02/23/2019   Procedure: FINE NEEDLE ASPIRATION;  Surgeon: [name], MD;  Location: [place];  Service: General;  Laterality: Left;  Asiration of left breast seroma   . NASAL TURBINATE REDUCTION    . PORT-A-CATH REMOVAL Right 02/23/2019   Procedure: REMOVAL PORT-A-CATH;  Surgeon: [name], MD;  Location: [place];  Service: General;  Laterality: Right;  . PORTACATH PLACEMENT N/A 06/26/2017   Procedure: INSERTION PORT-A-CATH;  Surgeon: [name], MD;  Location: [place];  Service: General;  Laterality: N/A;  . TRANSTHORACIC ECHOCARDIOGRAM  12/2015   A) [facility] May 2017: Mild concentric LVH. Global hypokinesis. GR daily. EF 18% severe LA dilation. Mitral annular dilatation with papillary muscle dysfunction and moderate MR. Dilated IVC consistent with elevated RAP.  [facility] TRANSTHORACIC ECHOCARDIOGRAM  05/2017; 08/2017  a) Mild concentric LVH.  EF 45 to 50% with diffuse hypokinesis.  GR 1 DD.  Mild aortic root dilation.;; b)  Mild LVH EF 45 to 50%.  Diffuse HK.  No significant valve disease.  No significant change.    I have reviewed the social history and family history with the patient and they are unchanged from previous note.  ALLERGIES:  is allergic to latex, tylenol with codeine #3 [acetaminophen-codeine], and penicillins.  MEDICATIONS:  Current Outpatient Medications  Medication Sig Dispense Refill  . aspirin 81 MG chewable tablet Chew 81 mg by mouth daily.    . carvedilol (COREG) 12.5 MG tablet Take 1 tablet (12.5 mg total) by mouth 2 (two) times daily with a meal. 180 tablet 1  . Cholecalciferol  (D3-1000 PO) Take by mouth.    . diclofenac (VOLTAREN) 75 MG EC tablet Take 1 tablet (75 mg total) by mouth 2 (two) times daily. 60 tablet 1  . furosemide (LASIX) 20 MG tablet Take 2 tablets (40 mg total) by mouth as needed. FOR WEIGHT GAIN  3 TO 4 LBS. 90 tablet 3  . ibuprofen (ADVIL) 800 MG tablet Take 1 tablet (800 mg total) by mouth every 8 (eight) hours as needed. 30 tablet 2  . linaclotide (LINZESS) 72 MCG capsule Take 1 capsule (72 mcg total) by mouth daily before breakfast. 30 capsule 1  . Multiple Vitamins-Minerals (WOMENS MULTI PO) Take by mouth.     . oxyCODONE-acetaminophen (PERCOCET) 5-325 MG tablet Take as needed twice a day for severe pain, or before scan 20 tablet 0  . Saccharomyces boulardii (PROBIOTIC) 250 MG CAPS Take by mouth.    . sacubitril-valsartan (ENTRESTO) 97-103 MG Take 1 tablet by mouth 2 (two) times daily. 180 tablet 3  . Semaglutide (RYBELSUS) 3 MG TABS Take 3 mg by mouth daily with breakfast. 30 tablet 3  . spironolactone (ALDACTONE) 25 MG tablet Take 1 tablet (25 mg total) by mouth daily. 90 tablet 3  . traMADol (ULTRAM) 50 MG tablet Take 1-2 tabs po bid prn pain 30 tablet 0  . vitamin B-12 (CYANOCOBALAMIN) 500 MCG tablet Take 500 mcg by mouth daily.    . vitamin C (ASCORBIC ACID) 500 MG tablet Take 1,000 mg 2 (two) times daily by mouth.     No current facility-administered medications for this visit.    PHYSICAL EXAMINATION: ECOG PERFORMANCE STATUS: 3 - Symptomatic, >50% confined to bed  Vitals:   08/16/20 1051  BP: (!) 128/91  Pulse: 72  Resp: 18  Temp: (!) 97 F (36.1 C)  SpO2: 99%   Filed Weights   08/16/20 1051  Weight: 222 lb 14.4 oz (101.1 kg)    GENERAL:alert, no distress and comfortable SKIN: skin color, texture, turgor are normal, no rashes or significant lesions EYES: normal, Conjunctiva are pink and non-injected, sclera clear  NECK: supple, thyroid normal size, non-tender, without nodularity LYMPH:  no palpable lymphadenopathy in the  cervical, axillary  LUNGS: clear to auscultation and percussion with normal breathing effort HEART: regular rate & rhythm and no murmurs and no lower extremity edema ABDOMEN:abdomen soft, non-tender and normal bowel sounds Musculoskeletal:no cyanosis of digits and no clubbing  NEURO: alert & oriented x 3 with fluent speech (+) Weakness of right LE BREAST: S/p left lumpectomy: Surgical incision healed well (+) Known 5.5x3cm in inferior left breast, overall stable. Right Breast exam benign.   LABORATORY DATA:  I have reviewed the data as listed CBC Latest Ref Rng & Units 08/16/2020 06/23/2020 02/16/2020  WBC 4.0 - 10.5 K/uL 7.1 6.7 6.6  Hemoglobin 12.0 - 15.0 g/dL 10.6(L) 12.6 11.6(L)  Hematocrit 36.0 - 46.0 % 32.7(L) 37.9 36.1  Platelets 150 - 400 K/uL 401(H) 364.0 337     CMP Latest Ref Rng & Units 08/16/2020 06/23/2020 02/16/2020  Glucose 70 - 99 mg/dL 105(H) 100(H) 117(H)  BUN 8 - 23 mg/dL '16 12 18  ' Creatinine 0.44 - 1.00 mg/dL 0.83 0.79 1.01(H)  Sodium 135 - 145 mmol/L 142 141 143  Potassium 3.5 - 5.1 mmol/L 4.0 4.5 4.2  Chloride 98 - 111 mmol/L 108 107 110  CO2 22 - 32 mmol/L '28 28 24  ' Calcium 8.9 - 10.3 mg/dL 9.6 9.6 9.5  Total Protein 6.5 - 8.1 g/dL 7.4 - 7.9  Total Bilirubin 0.3 - 1.2 mg/dL 0.4 - 0.5  Alkaline Phos 38 - 126 U/L 87 - 112  AST 15 - 41 U/L 22 - 27  ALT 0 - 44 U/L 18 - 22      RADIOGRAPHIC STUDIES: I have personally reviewed the radiological images as listed and agreed with the findings in the report. CT ABDOMEN PELVIS W CONTRAST  Result Date: 08/15/2020 CLINICAL DATA:  Lymphadenopathy. EXAM: CT ABDOMEN AND PELVIS WITH CONTRAST TECHNIQUE: Multidetector CT imaging of the abdomen and pelvis was performed using the standard protocol following bolus administration of intravenous contrast. CONTRAST:  137m ISOVUE-300 IOPAMIDOL (ISOVUE-300) INJECTION 61% COMPARISON:  July 23, 2020. FINDINGS: Lower chest: No acute abnormality. Hepatobiliary: Small gallstone is noted.  The liver is unremarkable. No biliary dilatation is noted. Pancreas: Unremarkable. No pancreatic ductal dilatation or surrounding inflammatory changes. Spleen: Normal in size without focal abnormality. Adrenals/Urinary Tract: Adrenal glands appear normal. Left renal cortical scarring is noted. No hydronephrosis or renal obstruction is noted. No renal or ureteral calculi are noted. Urinary bladder is unremarkable. Stomach/Bowel: Stomach is within normal limits. Appendix appears normal. No evidence of bowel wall thickening, distention, or inflammatory changes. Vascular/Lymphatic: Aortic atherosclerosis. Right iliac and periaortic adenopathy is noted. Largest lymph node measures 2.4 cm in right external iliac region. 2 cm right internal iliac lymph node is noted. 1.3 cm retrocaval lymph node is noted. Reproductive: Enlarged fibroid uterus is noted. No definite adnexal abnormality is noted. Other: No abdominal wall hernia or abnormality. No abdominopelvic ascites. There does appear to be abnormal soft tissue mass both anterior and posterior to the inferior portion of the right iliac wing concerning for malignancy or metastatic disease. The mass posterior to the iliac wing measures 8.6 x 4.4 cm. Musculoskeletal: Irregular lucency with sclerotic margins is seen involving the right iliac crest concerning for possible lytic lesion. IMPRESSION: 1. Right iliac and periaortic adenopathy is noted concerning for metastatic disease or lymphoma. Also noted is abnormal soft tissue mass anterior and posterior to the right iliac wing concerning for malignancy or metastatic disease. MRI is recommended for further evaluation. 2. Irregular lucency with sclerotic margins is seen involving the right iliac crest concerning for possible lytic lesion. MRI may be performed for further evaluation. 3. Enlarged fibroid uterus. 4. Small gallstone. 5. Aortic atherosclerosis. These results will be called to the ordering clinician or representative  by the Radiologist Assistant, and communication documented in the PACS or zVision Dashboard. Aortic Atherosclerosis (ICD10-I70.0). Electronically Signed   By: JMarijo ConceptionM.D.   On: 08/15/2020 16:44     ASSESSMENT & PLAN:  PBatina Douganis a 65y.o. female with    1. Constipation, Right Hip Pain, Buttock pain  -She notes  she had recurrent right hip pain since 2020. This now radiates down her right hip and buttock pain. This has caused right leg Weakness as seen on exam today (08/16/20) -Given buttock and hip pain she uses cane intermittently given pain is present when she walks or stands.  -She also has been having ongoing constipation since 2020/2021 with hard stool. She was started on Linzess by Dr Silverio Decamp which has helped.  -She had workup for her hip pain by Ortho Dr Erlinda Hong. Her 07/2020 MRI lumbar indicated abnormal RT nodes. CT AP was recommended. She plans to have right hip MRI on 08/20/20.  -I personally reviewed her CT AP from 08/15/20 with pt which shows Right iliac and periaortic adenopathy is noted concerning for metastatic disease or lymphoma and abnormal soft tissue mass anterior and posterior to the right iliac wing concerning for malignancy or metastatic disease.Scan also shows possible lytic lesion of right iliac crest. I personally reviewed scans with patient today.  -I discussed this presentation is concerning for malignancy, possible recurrence from breast cancer or new malignancy such as lymphoma. She will proceed with PET scan and biopsy for further evaluation. She is agreeable.   -08/16/20 labs show Hg 10.6, plt 401K, BG 105.  -For pain I will call in Percocet #20 tabs to take as needed (08/16/20). I will give prescription for wheelchair as well.    2. 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  She is s/p left breast lumpectomy, adjuvant TC and Radiation.  -Genetic testing was negative for pathogenetic mutations.  -Breast exam today (08/16/20) shows stable seroma in left breast (5.5x3cm), present since surgery. Next Mammogram on 09/14/20. Continue surveillance.    3. HTN, DM, COPD, retinitis pigmentosa with limited vision, Chronic combined systolic and diastolic heart failure, EF 45-50% -Continue to follow-up with PCP Dr Raenette Rover and cardiologistDr Ellyn Hack  -She is legally blind  4. Uterine Fibroids and Endometrial Thickening -As seen on 06/16/19 Pelvic US -Causes pain in her left abdomin monthly as if she is menstruating   5. Anemia -She developed anemia during adjuvant chemotherapy, which resolved after she completed chemo -She had recurrent mild anemia with Hgb 11.7 in 01/2019. Labs on 04/02/2019 showed normal iron, ferritin, folate, and MMA -Remains mild and intermittent.    PLAN: -I called in [place] today and gave wheelchair prescription  -PET scan in 1-2 weeks  -IR Biopsy in 1-2 weeks, I will discuss with Dr. Kathlene Cote  -Lab and f/u after PET and biopsy    No problem-specific Assessment & Plan notes found for this encounter.   Orders Placed This Encounter  Procedures  . NM PET Image Initial (PI) Skull Base To Thigh    Standing Status:   Future    Standing Expiration Date:   08/16/2021    Order Specific Question:   If indicated for the ordered procedure, I authorize the administration of a radiopharmaceutical per Radiology protocol    Answer:   Yes    Order Specific Question:   Preferred imaging location?    Answer:   [facility]    Order Specific Question:   Release to patient    Answer:   Immediate   All questions were answered. The patient knows to call the clinic with any problems, questions or concerns. No barriers to learning was detected. The total time spent in  the appointment was 30 minutes.     [name], MD 08/16/2020   I, Joslyn Devon, am acting as  scribe for [name], MD.   I have reviewed the above documentation for accuracy and completeness, and I agree with the above.

## 2020-08-15 ENCOUNTER — Ambulatory Visit
Admission: RE | Admit: 2020-08-15 | Discharge: 2020-08-15 | Disposition: A | Discharge status: Home / Self Care | Payer: Medicare Other | Source: Ambulatory Visit | Attending: Family Medicine | Admitting: Family Medicine

## 2020-08-15 ENCOUNTER — Telehealth: Payer: Self-pay | Admitting: Internal Medicine

## 2020-08-15 DIAGNOSIS — R599 Enlarged lymph nodes, unspecified: Secondary | ICD-10-CM

## 2020-08-15 DIAGNOSIS — J984 Other disorders of lung: Secondary | ICD-10-CM | POA: Diagnosis not present

## 2020-08-15 DIAGNOSIS — R102 Pelvic and perineal pain: Secondary | ICD-10-CM

## 2020-08-15 DIAGNOSIS — K8020 Calculus of gallbladder without cholecystitis without obstruction: Secondary | ICD-10-CM | POA: Diagnosis not present

## 2020-08-15 DIAGNOSIS — I700 Atherosclerosis of aorta: Secondary | ICD-10-CM | POA: Diagnosis not present

## 2020-08-15 DIAGNOSIS — R590 Localized enlarged lymph nodes: Secondary | ICD-10-CM

## 2020-08-15 MED ORDER — IOPAMIDOL (ISOVUE-300) INJECTION 61%
100.0000 mL | Freq: Once | INTRAVENOUS | Status: AC | PRN
  Administered 2020-08-15: 100 mL via INTRAVENOUS

## 2020-08-15 NOTE — Telephone Encounter (Signed)
Pt called and left a vm that she would like an order for a wheelchair. She is going today at 3pm for mri

## 2020-08-15 NOTE — Telephone Encounter (Signed)
She will need to get her orthopedist or pain management specialist (whoever is treating her for her chronic condition) to sign off on this or perhaps need a PT evaluation to determine necessity so that insurance will cover it.

## 2020-08-16 ENCOUNTER — Inpatient Hospital Stay: Payer: Medicare Other | Attending: Hematology

## 2020-08-16 ENCOUNTER — Inpatient Hospital Stay: Payer: Medicare Other | Admitting: Hematology

## 2020-08-16 ENCOUNTER — Other Ambulatory Visit: Payer: Self-pay

## 2020-08-16 ENCOUNTER — Other Ambulatory Visit: Payer: Self-pay | Admitting: Internal Medicine

## 2020-08-16 ENCOUNTER — Encounter (HOSPITAL_COMMUNITY): Payer: Self-pay

## 2020-08-16 ENCOUNTER — Encounter: Payer: Self-pay | Admitting: Hematology

## 2020-08-16 VITALS — BP 128/91 | HR 72 | Temp 97.0°F | Resp 18 | Ht 67.0 in | Wt 222.9 lb

## 2020-08-16 DIAGNOSIS — D649 Anemia, unspecified: Secondary | ICD-10-CM

## 2020-08-16 DIAGNOSIS — H3552 Pigmentary retinal dystrophy: Secondary | ICD-10-CM | POA: Insufficient documentation

## 2020-08-16 DIAGNOSIS — D259 Leiomyoma of uterus, unspecified: Secondary | ICD-10-CM | POA: Insufficient documentation

## 2020-08-16 DIAGNOSIS — I110 Hypertensive heart disease with heart failure: Secondary | ICD-10-CM | POA: Diagnosis not present

## 2020-08-16 DIAGNOSIS — Z171 Estrogen receptor negative status [ER-]: Secondary | ICD-10-CM

## 2020-08-16 DIAGNOSIS — J449 Chronic obstructive pulmonary disease, unspecified: Secondary | ICD-10-CM | POA: Diagnosis not present

## 2020-08-16 DIAGNOSIS — E119 Type 2 diabetes mellitus without complications: Secondary | ICD-10-CM | POA: Diagnosis not present

## 2020-08-16 DIAGNOSIS — C50512 Malignant neoplasm of lower-outer quadrant of left female breast: Secondary | ICD-10-CM | POA: Insufficient documentation

## 2020-08-16 DIAGNOSIS — R599 Enlarged lymph nodes, unspecified: Secondary | ICD-10-CM

## 2020-08-16 DIAGNOSIS — K5900 Constipation, unspecified: Secondary | ICD-10-CM | POA: Insufficient documentation

## 2020-08-16 DIAGNOSIS — D6481 Anemia due to antineoplastic chemotherapy: Secondary | ICD-10-CM | POA: Insufficient documentation

## 2020-08-16 DIAGNOSIS — M25551 Pain in right hip: Secondary | ICD-10-CM | POA: Insufficient documentation

## 2020-08-16 DIAGNOSIS — M899 Disorder of bone, unspecified: Secondary | ICD-10-CM

## 2020-08-16 DIAGNOSIS — E559 Vitamin D deficiency, unspecified: Secondary | ICD-10-CM

## 2020-08-16 LAB — CBC WITH DIFFERENTIAL (CANCER CENTER ONLY)
Abs Immature Granulocytes: 0.03 10*3/uL (ref 0.00–0.07)
Basophils Absolute: 0 10*3/uL (ref 0.0–0.1)
Basophils Relative: 0 %
Eosinophils Absolute: 0.6 10*3/uL — ABNORMAL HIGH (ref 0.0–0.5)
Eosinophils Relative: 8 %
HCT: 32.7 % — ABNORMAL LOW (ref 36.0–46.0)
Hemoglobin: 10.6 g/dL — ABNORMAL LOW (ref 12.0–15.0)
Immature Granulocytes: 0 %
Lymphocytes Relative: 20 %
Lymphs Abs: 1.4 10*3/uL (ref 0.7–4.0)
MCH: 29 pg (ref 26.0–34.0)
MCHC: 32.4 g/dL (ref 30.0–36.0)
MCV: 89.3 fL (ref 80.0–100.0)
Monocytes Absolute: 0.4 10*3/uL (ref 0.1–1.0)
Monocytes Relative: 6 %
Neutro Abs: 4.6 10*3/uL (ref 1.7–7.7)
Neutrophils Relative %: 66 %
Platelet Count: 401 10*3/uL — ABNORMAL HIGH (ref 150–400)
RBC: 3.66 MIL/uL — ABNORMAL LOW (ref 3.87–5.11)
RDW: 13.2 % (ref 11.5–15.5)
WBC Count: 7.1 10*3/uL (ref 4.0–10.5)
nRBC: 0 % (ref 0.0–0.2)

## 2020-08-16 LAB — COMPREHENSIVE METABOLIC PANEL
ALT: 18 U/L (ref 0–44)
AST: 22 U/L (ref 15–41)
Albumin: 3.8 g/dL (ref 3.5–5.0)
Alkaline Phosphatase: 87 U/L (ref 38–126)
Anion gap: 6 (ref 5–15)
BUN: 16 mg/dL (ref 8–23)
CO2: 28 mmol/L (ref 22–32)
Calcium: 9.6 mg/dL (ref 8.9–10.3)
Chloride: 108 mmol/L (ref 98–111)
Creatinine, Ser: 0.83 mg/dL (ref 0.44–1.00)
GFR, Estimated: >60 mL/min (ref >60)
Glucose, Bld: 105 mg/dL — ABNORMAL HIGH (ref 70–99)
Potassium: 4 mmol/L (ref 3.5–5.1)
Sodium: 142 mmol/L (ref 135–145)
Total Bilirubin: 0.4 mg/dL (ref 0.3–1.2)
Total Protein: 7.4 g/dL (ref 6.5–8.1)

## 2020-08-16 LAB — IRON AND TIBC
Iron: 52 ug/dL (ref 41–142)
Saturation Ratios: 15 % — ABNORMAL LOW (ref 21–57)
TIBC: 340 ug/dL (ref 236–444)
UIBC: 288 ug/dL (ref 120–384)

## 2020-08-16 LAB — VITAMIN D 25 HYDROXY (VIT D DEFICIENCY, FRACTURES): Vit D, 25-Hydroxy: 28.09 ng/mL — ABNORMAL LOW (ref 30–100)

## 2020-08-16 LAB — FERRITIN: Ferritin: 268 ng/mL (ref 11–307)

## 2020-08-16 MED ORDER — OXYCODONE-ACETAMINOPHEN 5-325 MG PO TABS: ORAL_TABLET | ORAL | 0 refills | Status: DC

## 2020-08-16 NOTE — Progress Notes (Signed)
Dr. Mosetta Putt, in light of the new unfortunate findings on her CT scan, I am referring her back for further evaluation. Please let me know if there are any additional tests you would like for me to get before you see her. Thank you, Hetty Blend, NP

## 2020-08-16 NOTE — Telephone Encounter (Signed)
Kristin Ward said to dis regard this that oncology was handling this

## 2020-08-16 NOTE — Addendum Note (Signed)
Addended by: [name] on: 08/16/2020 03:14 PM   Modules accepted: Orders

## 2020-08-16 NOTE — Progress Notes (Unsigned)
PB     2       Elease Hashimoto A. Gruen Female, 65 y.o., 1955-08-14  MRN:  [id number] Phone:  [phone] Judie Petit)       PCP:  Avanell Shackleton, NP-C Coverage:  [place] [employer] Medicare/[employer]  Next Appt With Radiology (GI-315 MR 2) 08/20/2020 at 2:40 PM           RE: Biopsy Received: Today  Message Details  Irish Lack, MD  Ivonne Andrew, MD; Marily Lente, RN Cc: Pincus Sanes D For rad schedulers:  OK to schedule for CT guided biopsy of right gluteal soft tissue mass.   GY    Previous Messages  ----- Message -----  From: Wilmon Pali  Sent: 08/16/2020  4:24 PM EST  To: Irish Lack, MD, Servando Salina, *  Subject: RE: Biopsy                    CT Biopsy [phone] referral authorized  ----- Message -----  From: [name], MD  Sent: 08/16/2020  3:12 PM EST  To: Irish Lack, MD, Servando Salina, *  Subject: RE: Biopsy                    Thanks much Sherrine Maples, I will place the CT guided biopsy now, please approve. I will get PET for staging   Terrace Arabia  ----- Message -----  From: Irish Lack, MD  Sent: 08/16/2020  2:10 PM EST  To: Jama Flavors, *  Subject: Biopsy                      Intramuscular soft tissue mass of right pelvis would be easy to sample under CT guidance and probably don't need PET completed before.   GY    ----- Message -----  From: [name], MD  Sent: 08/16/2020  1:29 PM EST  To: Irish Lack, MD, Marily Lente, RN, Sherrine Maples,   Could you review her CT scan from yesterday and let me know if you can biopsy the soft tissue lesion around right iliac bone? Do you need [place] or CT to guild biopsy?   Solmon Ice, please get her PET scan PA. Clydie Braun, please help to schedule asap, and also schedule biopsy after Dr. Fredia Sorrow approves it   Thanks   Terrace Arabia

## 2020-08-16 NOTE — Progress Notes (Signed)
Please put in urgent referral to her oncologist, Dr. Mosetta Putt, I believe but please confirm. Diagnosis is right iliac adenopathy and bone lesions. Another diagnosis is abnormal CT.

## 2020-08-17 ENCOUNTER — Telehealth: Payer: Self-pay | Admitting: Hematology

## 2020-08-17 ENCOUNTER — Other Ambulatory Visit: Payer: Self-pay | Admitting: Internal Medicine

## 2020-08-17 ENCOUNTER — Other Ambulatory Visit: Payer: Self-pay | Admitting: Nurse Practitioner

## 2020-08-17 MED ORDER — ERGOCALCIFEROL 1.25 MG (50000 UT) PO CAPS: 50000.0000 [IU] | ORAL_CAPSULE | ORAL | 0 refills | Status: DC

## 2020-08-17 NOTE — Telephone Encounter (Signed)
Scheduled appts per 1/5 los. Pt to get updated appt calendar at next visit per appt notes.  °

## 2020-08-19 ENCOUNTER — Ambulatory Visit (HOSPITAL_COMMUNITY): Payer: Medicare Other

## 2020-08-20 ENCOUNTER — Other Ambulatory Visit: Payer: Self-pay

## 2020-08-20 ENCOUNTER — Ambulatory Visit: Admission: RE | Admit: 2020-08-20 | Payer: Medicare HMO | Source: Ambulatory Visit

## 2020-08-21 ENCOUNTER — Telehealth: Payer: Self-pay

## 2020-08-21 ENCOUNTER — Other Ambulatory Visit: Payer: Self-pay | Admitting: Student

## 2020-08-21 NOTE — Telephone Encounter (Signed)
Do you want to work her in? 

## 2020-08-21 NOTE — Telephone Encounter (Signed)
-----   Message from Alla Feeling, NP sent at 08/17/2020 11:20 AM EST ----- Please let her know she has vitamin D insufficiency, but has been deficient in the past. Vit D 1000 IU is on her list, if she is taking that consistently, it is not enough. I recommend 50,000 IU once weekly, I sent in Rx. Please let her know to start.   Thanks, Regan Rakers, NP

## 2020-08-21 NOTE — Telephone Encounter (Signed)
Happy to work her in or i'm happy to send in pain meds to help her get through MRI too.

## 2020-08-21 NOTE — Telephone Encounter (Signed)
Patient called she stated she was in so much pain she was unable to complete the mri she stated this is her 2nd time trying and she couldn't complete it due to painshe would like a call back. [phone]

## 2020-08-21 NOTE — Telephone Encounter (Signed)
Left message relaying vitamin D deficiency and new script called in encouraged to call for any questions or concerns

## 2020-08-21 NOTE — Telephone Encounter (Signed)
Pt is already taking oxycodone. I scheduled her to come and see you on Wednesday to discuss this more

## 2020-08-21 NOTE — Telephone Encounter (Signed)
Ok thanks 

## 2020-08-22 ENCOUNTER — Other Ambulatory Visit: Payer: Self-pay

## 2020-08-22 ENCOUNTER — Encounter: Payer: Self-pay | Admitting: Hematology

## 2020-08-22 ENCOUNTER — Ambulatory Visit (HOSPITAL_COMMUNITY)
Admission: RE | Admit: 2020-08-22 | Discharge: 2020-08-22 | Disposition: A | Discharge status: Home / Self Care | Payer: Medicare Other | Source: Ambulatory Visit | Attending: Hematology | Admitting: Hematology

## 2020-08-22 ENCOUNTER — Inpatient Hospital Stay (HOSPITAL_BASED_OUTPATIENT_CLINIC_OR_DEPARTMENT_OTHER): Payer: Medicare Other | Admitting: Hematology

## 2020-08-22 ENCOUNTER — Encounter (HOSPITAL_COMMUNITY): Payer: Self-pay

## 2020-08-22 VITALS — BP 135/80 | HR 83 | Temp 98.2°F | Resp 20 | Ht 67.0 in | Wt 221.9 lb

## 2020-08-22 DIAGNOSIS — Z853 Personal history of malignant neoplasm of breast: Secondary | ICD-10-CM | POA: Diagnosis not present

## 2020-08-22 DIAGNOSIS — R590 Localized enlarged lymph nodes: Secondary | ICD-10-CM | POA: Diagnosis not present

## 2020-08-22 DIAGNOSIS — C50512 Malignant neoplasm of lower-outer quadrant of left female breast: Secondary | ICD-10-CM

## 2020-08-22 DIAGNOSIS — Z171 Estrogen receptor negative status [ER-]: Secondary | ICD-10-CM | POA: Diagnosis not present

## 2020-08-22 DIAGNOSIS — M25551 Pain in right hip: Secondary | ICD-10-CM | POA: Insufficient documentation

## 2020-08-22 DIAGNOSIS — R2241 Localized swelling, mass and lump, right lower limb: Secondary | ICD-10-CM | POA: Insufficient documentation

## 2020-08-22 DIAGNOSIS — C50919 Malignant neoplasm of unspecified site of unspecified female breast: Secondary | ICD-10-CM | POA: Diagnosis not present

## 2020-08-22 DIAGNOSIS — R222 Localized swelling, mass and lump, trunk: Secondary | ICD-10-CM | POA: Diagnosis not present

## 2020-08-22 DIAGNOSIS — C50412 Malignant neoplasm of upper-outer quadrant of left female breast: Secondary | ICD-10-CM | POA: Insufficient documentation

## 2020-08-22 DIAGNOSIS — M6289 Other specified disorders of muscle: Secondary | ICD-10-CM | POA: Diagnosis not present

## 2020-08-22 DIAGNOSIS — C7989 Secondary malignant neoplasm of other specified sites: Secondary | ICD-10-CM | POA: Insufficient documentation

## 2020-08-22 LAB — GLUCOSE, CAPILLARY: Glucose-Capillary: 98 mg/dL (ref 70–99)

## 2020-08-22 MED ORDER — SODIUM CHLORIDE 0.9 % IV SOLN: INTRAVENOUS | Status: DC

## 2020-08-22 MED ORDER — FENTANYL CITRATE (PF) 100 MCG/2ML IJ SOLN
INTRAMUSCULAR | Status: AC | PRN
  Administered 2020-08-22 (×2): 50 ug via INTRAVENOUS

## 2020-08-22 MED ORDER — LIDOCAINE HCL 1 % IJ SOLN
INTRAMUSCULAR | Status: AC
  Filled 2020-08-22: qty 20

## 2020-08-22 MED ORDER — MIDAZOLAM HCL 2 MG/2ML IJ SOLN
INTRAMUSCULAR | Status: AC
  Filled 2020-08-22: qty 4

## 2020-08-22 MED ORDER — MORPHINE SULFATE ER 15 MG PO TBCR: 15.0000 mg | EXTENDED_RELEASE_TABLET | Freq: Two times a day (BID) | ORAL | 0 refills | Status: DC

## 2020-08-22 MED ORDER — OXYCODONE HCL 5 MG PO TABS: 5.0000 mg | ORAL_TABLET | Freq: Four times a day (QID) | ORAL | 0 refills | Status: DC | PRN

## 2020-08-22 MED ORDER — HYDROMORPHONE HCL 1 MG/ML IJ SOLN
INTRAMUSCULAR | Status: AC
  Administered 2020-08-22: 1 mg via INTRAVENOUS
  Filled 2020-08-22: qty 1

## 2020-08-22 MED ORDER — MIDAZOLAM HCL 2 MG/2ML IJ SOLN
INTRAMUSCULAR | Status: AC | PRN
  Administered 2020-08-22: 1 mg via INTRAVENOUS

## 2020-08-22 MED ORDER — HYDROMORPHONE HCL 1 MG/ML IJ SOLN
1.0000 mg | Freq: Once | INTRAMUSCULAR | Status: AC
  Filled 2020-08-22: qty 1

## 2020-08-22 MED ORDER — FENTANYL CITRATE (PF) 100 MCG/2ML IJ SOLN
INTRAMUSCULAR | Status: AC
  Filled 2020-08-22: qty 2

## 2020-08-22 NOTE — Progress Notes (Signed)
Discharge instructions reviewed with pt and her friend Stanton Kidney  also reviewed with pt daughter. Pt is to go see Dr Morey Hummingbird when she leaves here. Pt and [facility] informed all voice understanding.

## 2020-08-22 NOTE — Procedures (Signed)
Interventional Radiology Procedure Note  Procedure: CT guided biopsy right hip mass  Complications: None  Estimated Blood Loss: None  Recommendations: - Path pending   Signed,  Criselda Peaches, MD

## 2020-08-22 NOTE — Progress Notes (Signed)
[place]   [phone] [phone]   Clinic Follow up Note   Patient Care Team: Girtha Rm, NP-C as PCP - General (Family Medicine) [name], MD as PCP - Cardiology (Cardiology) [name], MD as Consulting Physician (Hematology) [name], MD as Consulting Physician (General Surgery) 08/22/2020  CHIEF COMPLAINT: Intractable right hip pain  SUMMARY OF ONCOLOGIC HISTORY: Oncology History Overview Note  Cancer Staging Malignant neoplasm of lower-outer quadrant of left breast of female, estrogen receptor negative ([place]) Staging form: Breast, AJCC 8th Edition - Clinical stage from 06/06/2017: Stage IB (cT1c, cN0, cM0, G3, ER: Negative, PR: Negative, HER2: Negative) - Signed by [name], MD on 06/15/2017 - Pathologic stage from 06/26/2017: Stage IB (pT1c, pN0, cM0, G3, ER: Negative, PR: Negative, HER2: Negative) - Signed by [name], MD on 07/10/2017     Malignant neoplasm of lower-outer quadrant of left breast of female, estrogen receptor negative ([place])  05/30/2017 Mammogram   Diagnostic mammo and US IMPRESSION: 1. Highly suspicious 1.4 cm mass in the slightly lower slightly outer left breast -tissue sampling recommended. 2. Indeterminate 0.5 mm mass in the slightly lower slightly outer left breast -tissue sampling recommended. 3. At least 2 left axillary lymph nodes with borderline cortical thickness.   06/06/2017 Initial Biopsy   Diagnosis 1. Breast, left, needle core biopsy, 5:30 o'clock - INVASIVE DUCTAL CARCINOMA, G3 2. Lymph node, needle/core biopsy, left axillary - NO CARCINOMA IDENTIFIED IN ONE LYMPH NODE (0/1)   06/06/2017 Initial Diagnosis   Malignant neoplasm of lower-outer quadrant of left breast of female, estrogen receptor negative ([place])   06/06/2017 Receptors her2   Estrogen Receptor: 0%, NEGATIVE Progesterone Receptor: 0%, NEGATIVE Proliferation Marker Ki67: 70%   06/26/2017 Surgery   LEFT BREAST  LUMPECTOMY WITH RADIOACTIVE SEED AND SENTINEL LYMPH NODE BIOPSY ERAS  PATHWAY AND INSERTION PORT-A-CATH By Dr. Barry Dienes on 06/26/17    06/26/2017 Pathology Results   Diagnosis 06/26/17  1. Breast, lumpectomy, Left - INVASIVE DUCTAL CARCINOMA, GRADE III/III, SPANNING 1.2 CM. - THE SURGICAL RESECTION MARGINS ARE NEGATIVE FOR CARCINOMA. - SEE ONCOLOGY TABLE BELOW. 2. Lymph node, sentinel, biopsy, Left axillary #1 - THERE IS NO EVIDENCE OF CARCINOMA IN 1 OF 1 LYMPH NODE (0/1). 3. Lymph node, sentinel, biopsy, Left axillary #2 - THERE IS NO EVIDENCE OF CARCINOMA IN 1 OF 1 LYMPH NODE (0/1). 4. Lymph node, sentinel, biopsy, Left axillary #3 - THERE IS NO EVIDENCE OF CARCINOMA IN 1 OF 1 LYMPH NODE (0/1).    07/25/2017 - 09/29/2017 Chemotherapy   Adjuvant cytoxan and docetaxel (TC) every 3 weekd for 4 cycles     11/19/2017 - 12/17/2017 Radiation Therapy   Adjuvant breast radiation Left breast treated to 42.5 Gy with 17 fx of 2.5 Gy followed by a boost of 7.5 Gy with 3 fx of 2.5 Gy     02/2019 Procedure   She had PAC removal in 02/2019.      CURRENT THERAPY: pending   INTERVAL HISTORY: [place] went to IR for biopsy today.  I was called by IR Dr. Laurence Ferrari for her intractable right hip pain.  She was in severe pain when she laid down on biopsy table, Dr. Laurence Ferrari had to give her IV Dilaudid a few times.  Biopsy was done, and patient came to my clinic to discuss her pain management.  Her pain is aggravated by position (laying on her back or her right side), and walking.  She has been taking Percocet as needed 2-3 times  a day, which helps some but does not last long.  She has been having hard time to sleep at night due to the pain.  The pain overall is much worse than last week when I saw her.  She has right leg weakness, cannot bear weight due to the pain.  She is using wheelchair most of the time.  She has mild numbness rating to the right foot.  No urinary or stool incontinence.  No other new  concerns.   All other systems were reviewed with the patient and are negative.  MEDICAL HISTORY:  Past Medical History:  Diagnosis Date  . Abnormal x-ray of lungs with single pulmonary nodule 03/15/2016   per records from [place]   . Anemia   . Asthma   . Cholelithiasis 05/19/2017   On CT  . Coronary artery calcification seen on CAT scan 05/19/2017  . Diabetes mellitus without complication ([place])    type 2  . Dilated idiopathic cardiomyopathy ([place]) 12/2015   EF 15-20%. Diagnosed in [facility]  . Headache    NONE RECENT  . Hypertension   . Lymph nodes enlarged 07/25/2020  . Malignant neoplasm of lower-outer quadrant of left breast of female, estrogen receptor negative ([place]) 06/11/2017   left breast  . Mixed hyperlipidemia 06/17/2018  . partially blind   . Personal history of chemotherapy 2018-2019  . Personal history of radiation therapy 2019  . Retinitis pigmentosa   . Retroperitoneal lymphadenopathy 07/25/2020  . Uveitis     SURGICAL HISTORY: Past Surgical History:  Procedure Laterality Date  . brain cyst removed  2007   to help with headaches per patient , aspirated   . BREAST BIOPSY Left 06/06/2017   x2  . BREAST CYST ASPIRATION Left 06/06/2017  . BREAST LUMPECTOMY Left 06/26/2017  . BREAST LUMPECTOMY WITH RADIOACTIVE SEED AND SENTINEL LYMPH NODE BIOPSY Left 06/26/2017   Procedure: BREAST LUMPECTOMY WITH RADIOACTIVE SEED AND SENTINEL LYMPH NODE BIOPSY ERAS  PATHWAY;  Surgeon: [name], MD;  Location: [place];  Service: General;  Laterality: Left;  pec block  . FINE NEEDLE ASPIRATION Left 02/23/2019   Procedure: FINE NEEDLE ASPIRATION;  Surgeon: [name], MD;  Location: [place];  Service: General;  Laterality: Left;  Asiration of left breast seroma   . NASAL TURBINATE REDUCTION    . PORT-A-CATH REMOVAL Right 02/23/2019   Procedure: REMOVAL PORT-A-CATH;  Surgeon: [name], MD;  Location: [place];   Service: General;  Laterality: Right;  . PORTACATH PLACEMENT N/A 06/26/2017   Procedure: INSERTION PORT-A-CATH;  Surgeon: [name], MD;  Location: [place];  Service: General;  Laterality: N/A;  . TRANSTHORACIC ECHOCARDIOGRAM  12/2015   A) [facility] May 2017: Mild concentric LVH. Global hypokinesis. GR daily. EF 18% severe LA dilation. Mitral annular dilatation with papillary muscle dysfunction and moderate MR. Dilated IVC consistent with elevated RAP.  [facility] TRANSTHORACIC ECHOCARDIOGRAM  05/2017; 08/2017   a) Mild concentric LVH.  EF 45 to 50% with diffuse hypokinesis.  GR 1 DD.  Mild aortic root dilation.;; b)  Mild LVH EF 45 to 50%.  Diffuse HK.  No significant valve disease.  No significant change.    I have reviewed the social history and family history with the patient and they are unchanged from previous note.  ALLERGIES:  is allergic to latex, tylenol with codeine #3 [acetaminophen-codeine], and penicillins.  MEDICATIONS:  Current Outpatient Medications  Medication Sig Dispense Refill  . morphine (MS CONTIN) 15 MG 12  hr tablet Take 1 tablet (15 mg total) by mouth every 12 (twelve) hours. 30 tablet 0  . oxyCODONE (OXY IR/ROXICODONE) 5 MG immediate release tablet Take 1 tablet (5 mg total) by mouth every 6 (six) hours as needed for severe pain. 60 tablet 0  . aspirin 81 MG chewable tablet Chew 81 mg by mouth daily.    . carvedilol (COREG) 12.5 MG tablet Take 1 tablet (12.5 mg total) by mouth 2 (two) times daily with a meal. 180 tablet 1  . Cholecalciferol (D3-1000 PO) Take by mouth.    . diclofenac (VOLTAREN) 75 MG EC tablet Take 1 tablet (75 mg total) by mouth 2 (two) times daily. 60 tablet 1  . ergocalciferol (VITAMIN D2) 1.25 MG (50000 UT) capsule Take 1 capsule (50,000 Units total) by mouth once a week. 12 capsule 0  . furosemide (LASIX) 20 MG tablet Take 2 tablets (40 mg total) by mouth as needed. FOR WEIGHT GAIN  3 TO 4 LBS. 90 tablet 3  . glucose blood (ACCU-CHEK GUIDE)  test strip Use as instructed to test blood sugar 2 times a day 100 strip 2  . ibuprofen (ADVIL) 800 MG tablet Take 1 tablet (800 mg total) by mouth every 8 (eight) hours as needed. 30 tablet 2  . linaclotide (LINZESS) 72 MCG capsule Take 1 capsule (72 mcg total) by mouth daily before breakfast. 30 capsule 1  . Multiple Vitamins-Minerals (WOMENS MULTI PO) Take by mouth.     . oxyCODONE-acetaminophen (PERCOCET) 5-325 MG tablet Take as needed twice a day for severe pain, or before scan 20 tablet 0  . Saccharomyces boulardii (PROBIOTIC) 250 MG CAPS Take by mouth.    . sacubitril-valsartan (ENTRESTO) 97-103 MG Take 1 tablet by mouth 2 (two) times daily. 180 tablet 3  . Semaglutide (RYBELSUS) 3 MG TABS Take 3 mg by mouth daily with breakfast. 30 tablet 3  . spironolactone (ALDACTONE) 25 MG tablet Take 1 tablet (25 mg total) by mouth daily. 90 tablet 3  . traMADol (ULTRAM) 50 MG tablet Take 1-2 tabs po bid prn pain 30 tablet 0  . vitamin B-12 (CYANOCOBALAMIN) 500 MCG tablet Take 500 mcg by mouth daily.    . vitamin C (ASCORBIC ACID) 500 MG tablet Take 1,000 mg 2 (two) times daily by mouth.     No current facility-administered medications for this visit.   Facility-Administered Medications Ordered in Other Visits  Medication Dose Route Frequency Provider Last Rate Last Admin  . 0.9 %  sodium chloride infusion   Intravenous Continuous Ronney Lion, PA-C 10 mL/hr at 08/22/20 1055 New Bag at 08/22/20 1055  . fentaNYL (SUBLIMAZE) 100 MCG/2ML injection           . lidocaine (XYLOCAINE) 1 % (with pres) injection           . midazolam (VERSED) 2 MG/2ML injection             PHYSICAL EXAMINATION: ECOG PERFORMANCE STATUS: 3 - Symptomatic, >50% confined to bed  Vitals:   08/22/20 1341  BP: 135/80  Pulse: 83  Resp: 20  Temp: 98.2 F (36.8 C)  SpO2: 100%   Filed Weights   08/22/20 1341  Weight: 221 lb 14.4 oz (100.7 kg)    GENERAL:alert, no distress and comfortable SKIN: skin color,  texture, turgor are normal, no rashes or significant lesions EYES: normal, Conjunctiva are pink and non-injected, sclera clear LUNGS: clear to auscultation and percussion with normal breathing effort HEART: regular rate & rhythm and  no murmurs and no lower extremity edema ABDOMEN:abdomen soft, non-tender and normal bowel sounds Musculoskeletal:no cyanosis of digits and no clubbing, she is not able to bear weight on right leg, not able to elevated right leg due to pain, normal sensation  NEURO: alert & oriented x 3 with fluent speech, no focal motor/sensory deficits  LABORATORY DATA:  I have reviewed the data as listed CBC Latest Ref Rng & Units 08/16/2020 06/23/2020 02/16/2020  WBC 4.0 - 10.5 K/uL 7.1 6.7 6.6  Hemoglobin 12.0 - 15.0 g/dL 10.6(L) 12.6 11.6(L)  Hematocrit 36.0 - 46.0 % 32.7(L) 37.9 36.1  Platelets 150 - 400 K/uL 401(H) 364.0 337     CMP Latest Ref Rng & Units 08/16/2020 06/23/2020 02/16/2020  Glucose 70 - 99 mg/dL 105(H) 100(H) 117(H)  BUN 8 - 23 mg/dL '16 12 18  ' Creatinine 0.44 - 1.00 mg/dL 0.83 0.79 1.01(H)  Sodium 135 - 145 mmol/L 142 141 143  Potassium 3.5 - 5.1 mmol/L 4.0 4.5 4.2  Chloride 98 - 111 mmol/L 108 107 110  CO2 22 - 32 mmol/L '28 28 24  ' Calcium 8.9 - 10.3 mg/dL 9.6 9.6 9.5  Total Protein 6.5 - 8.1 g/dL 7.4 - 7.9  Total Bilirubin 0.3 - 1.2 mg/dL 0.4 - 0.5  Alkaline Phos 38 - 126 U/L 87 - 112  AST 15 - 41 U/L 22 - 27  ALT 0 - 44 U/L 18 - 22      RADIOGRAPHIC STUDIES: I have personally reviewed the radiological images as listed and agreed with the findings in the report. CT Biopsy  Result Date: 08/22/2020 INDICATION: 65 year old female with lymphadenopathy and a painful soft tissue mass within the right gluteus minimus muscle. She presents for CT-guided biopsy of the same. EXAM: CT-guided biopsy of right gluteus minimus muscle. MEDICATIONS: None. ANESTHESIA/SEDATION: Moderate (conscious) sedation was employed during this procedure. A total of Versed 2 mg and  Fentanyl 100 mcg was administered intravenously. Moderate Sedation Time: 10 minutes. The patient's level of consciousness and vital signs were monitored continuously by radiology nursing throughout the procedure under my direct supervision. FLUOROSCOPY TIME:  None. COMPLICATIONS: None immediate. PROCEDURE: Informed written consent was obtained from the patient after a thorough discussion of the procedural risks, benefits and alternatives. All questions were addressed. Maximal Sterile Barrier Technique was utilized including caps, mask, sterile gowns, sterile gloves, sterile drape, hand hygiene and skin antiseptic. A timeout was performed prior to the initiation of the procedure. A planning axial CT scan was performed. The mass within the right gluteus minimus muscle was successfully identified. A suitable skin entry site was selected and marked. The overlying skin was sterilely prepped and draped in the standard fashion. Local anesthesia was attained by infiltration with 1% lidocaine. A small dermatotomy was made. Under intermittent CT guidance, a 17 gauge introducer needle was advanced through the right lower quadrant abdominal wall and into the margin of the mass. Multiple 18 gauge core biopsies were then obtained coaxially using the BioPince automated biopsy device. Biopsy specimens were placed in formalin and delivered to pathology for further analysis. Post biopsy CT imaging demonstrates no evidence of complication. The patient tolerated the procedure well. IMPRESSION: Successful CT-guided biopsy of right gluteus minimus soft tissue mass. Electronically Signed   By: Jacqulynn Cadet M.D.   On: 08/22/2020 16:01     ASSESSMENT & PLAN:  65 yo female with PMH of breast cancer  1.  Intractable right hip pain 2. History of malignant neoplasm of lower-outer quadrant of left  breast, invasive ductal carcinoma, stage IB, pT1c, N0, M0, Triple negative, Grade 3, in 2018   Plan -her right hip pain is caused by  the large infiltrative mass around the right iliac wing, status post biopsy today. -I called in MS Contin 15 mg every 12 hours today, and refilled her oxycodone -I will call her in 2 days when I have her biopsy results back.  If malignancy is confirmed, I will likely refer her to radiation oncology for palliative radiation  -She is scheduled for PET scan next Monday, and to follow-up with me later next week  All questions were answered. The patient knows to call the clinic with any problems, questions or concerns. No barriers to learning was detected. I spent 20 minutes counseling the patient face to face. The total time spent in the appointment was 25 minutes and more than 50% was on counseling and review of test results     [name], MD 08/22/20

## 2020-08-22 NOTE — H&P (Signed)
Chief Complaint: Patient was seen in consultation today for right gluteal soft tissue biopsy at the request of Feng,Yan  Referring Physician(s): Feng,Yan  Supervising Physician: Jacqulynn Cadet  Patient Status: [facility] - Out-pt  History of Present Illness: Kristin Ward is a 65 y.o. female   Known triple neg breast cancer 2018 Did well with treatment Noted Rt hip pain sometime in 2020-- but resolved on own Feb 2021--- pain returned and has never gone away--- worsening all along Severe x 8 weeks  CT 08/15/20:  IMPRESSION: 1. Right iliac and periaortic adenopathy is noted concerning for metastatic disease or lymphoma. Also noted is abnormal soft tissue mass anterior and posterior to the right iliac wing concerning for malignancy or metastatic disease. MRI is recommended for further evaluation. 2. Irregular lucency with sclerotic margins is seen involving the right iliac crest concerning for possible lytic lesion. MRI may be performed for further evaluation.   Pt follows with Dr Burr Medico Now scheduled for Rt gluteal ST mass biopsy PET scan scheduled for 08/28/20  Past Medical History:  Diagnosis Date  . Abnormal x-ray of lungs with single pulmonary nodule 03/15/2016   per records from [place]   . Anemia   . Asthma   . Cholelithiasis 05/19/2017   On CT  . Coronary artery calcification seen on CAT scan 05/19/2017  . Diabetes mellitus without complication ([place])    type 2  . Dilated idiopathic cardiomyopathy ([place]) 12/2015   EF 15-20%. Diagnosed in [facility]  . Headache    NONE RECENT  . Hypertension   . Lymph nodes enlarged 07/25/2020  . Malignant neoplasm of lower-outer quadrant of left breast of female, estrogen receptor negative ([place]) 06/11/2017   left breast  . Mixed hyperlipidemia 06/17/2018  . partially blind   . Personal history of chemotherapy 2018-2019  . Personal history of radiation therapy 2019  . Retinitis pigmentosa   .  Retroperitoneal lymphadenopathy 07/25/2020  . Uveitis     Past Surgical History:  Procedure Laterality Date  . brain cyst removed  2007   to help with headaches per patient , aspirated   . BREAST BIOPSY Left 06/06/2017   x2  . BREAST CYST ASPIRATION Left 06/06/2017  . BREAST LUMPECTOMY Left 06/26/2017  . BREAST LUMPECTOMY WITH RADIOACTIVE SEED AND SENTINEL LYMPH NODE BIOPSY Left 06/26/2017   Procedure: BREAST LUMPECTOMY WITH RADIOACTIVE SEED AND SENTINEL LYMPH NODE BIOPSY ERAS  PATHWAY;  Surgeon: [name], MD;  Location: [place];  Service: General;  Laterality: Left;  pec block  . FINE NEEDLE ASPIRATION Left 02/23/2019   Procedure: FINE NEEDLE ASPIRATION;  Surgeon: [name], MD;  Location: [place];  Service: General;  Laterality: Left;  Asiration of left breast seroma   . NASAL TURBINATE REDUCTION    . PORT-A-CATH REMOVAL Right 02/23/2019   Procedure: REMOVAL PORT-A-CATH;  Surgeon: [name], MD;  Location: [place];  Service: General;  Laterality: Right;  . PORTACATH PLACEMENT N/A 06/26/2017   Procedure: INSERTION PORT-A-CATH;  Surgeon: [name], MD;  Location: [place];  Service: General;  Laterality: N/A;  . TRANSTHORACIC ECHOCARDIOGRAM  12/2015   A) [facility] May 2017: Mild concentric LVH. Global hypokinesis. GR daily. EF 18% severe LA dilation. Mitral annular dilatation with papillary muscle dysfunction and moderate MR. Dilated IVC consistent with elevated RAP.  [facility] TRANSTHORACIC ECHOCARDIOGRAM  05/2017; 08/2017   a) Mild concentric LVH.  EF 45 to 50% with diffuse hypokinesis.  GR 1 DD.  Mild aortic  root dilation.;; b)  Mild LVH EF 45 to 50%.  Diffuse HK.  No significant valve disease.  No significant change.    Allergies: Latex, Tylenol with codeine #3 [acetaminophen-codeine], and Penicillins  Medications: Prior to Admission medications   Medication Sig Start Date End Date Taking? Authorizing Provider  aspirin 81 MG  chewable tablet Chew 81 mg by mouth daily.   Yes [provider]  carvedilol (COREG) 12.5 MG tablet Take 1 tablet (12.5 mg total) by mouth 2 (two) times daily with a meal. 11/19/19  Yes [name], MD  ibuprofen (ADVIL) 800 MG tablet Take 1 tablet (800 mg total) by mouth every 8 (eight) hours as needed. 08/01/20  Yes Leandrew Koyanagi, MD  linaclotide Rolan Lipa) 72 MCG capsule Take 1 capsule (72 mcg total) by mouth daily before breakfast. 06/23/20  Yes Kennedy-Smith, Patrecia Pour, NP  sacubitril-valsartan (ENTRESTO) 97-103 MG Take 1 tablet by mouth 2 (two) times daily. 11/19/19  Yes [name], MD  spironolactone (ALDACTONE) 25 MG tablet Take 1 tablet (25 mg total) by mouth daily. 07/10/20 10/08/20 Yes [name], MD  vitamin C (ASCORBIC ACID) 500 MG tablet Take 1,000 mg 2 (two) times daily by mouth.   Yes [provider]  Cholecalciferol (D3-1000 PO) Take by mouth.    [provider]  diclofenac (VOLTAREN) 75 MG EC tablet Take 1 tablet (75 mg total) by mouth 2 (two) times daily. 11/17/19   Henson, Vickie L, NP-C  ergocalciferol (VITAMIN D2) 1.25 MG (50000 UT) capsule Take 1 capsule (50,000 Units total) by mouth once a week. 08/17/20   Alla Feeling, NP  furosemide (LASIX) 20 MG tablet Take 2 tablets (40 mg total) by mouth as needed. FOR WEIGHT GAIN  3 TO 4 LBS. 07/10/20 10/08/20  [name], MD  glucose blood (ACCU-CHEK GUIDE) test strip Use as instructed to test blood sugar 2 times a day 08/17/20   Shamleffer, Melanie Crazier, MD  Multiple Vitamins-Minerals (WOMENS MULTI PO) Take by mouth.     [provider]  oxyCODONE-acetaminophen (PERCOCET) 5-325 MG tablet Take as needed twice a day for severe pain, or before scan 08/16/20   [name], MD  Saccharomyces boulardii (PROBIOTIC) 250 MG CAPS Take by mouth.    [provider]  Semaglutide (RYBELSUS) 3 MG TABS Take 3 mg by mouth daily with breakfast. 11/25/19   Shamleffer, Melanie Crazier, MD  traMADol  (ULTRAM) 50 MG tablet Take 1-2 tabs po bid prn pain 06/07/20   Aundra Dubin, PA-C  vitamin B-12 (CYANOCOBALAMIN) 500 MCG tablet Take 500 mcg by mouth daily.    [provider]     Family History  Problem Relation Age of Onset  . Colon cancer Mother 50       deceased 3  . Heart attack Father   . Breast cancer Other 14       2nd cousin on maternal side; currently 63  . Pancreatic cancer Other        2nd cousin once removed on maternal side; deceased 28s  . Fibromyalgia Sister   . Esophageal cancer Neg Hx   . Rectal cancer Neg Hx   . Stomach cancer Neg Hx     Social History   Socioeconomic History  . Marital status: Widowed    Spouse name: Not on file  . Number of children: 2  . Years of education: Not on file  . Highest education level: Not on file  Occupational History  .  Not on file  Tobacco Use  . Smoking status: Former Smoker    Packs/day: 0.25    Years: 23.00    Pack years: 5.75    Types: Cigarettes    Quit date: 10/09/2015    Years since quitting: 4.8  . Smokeless tobacco: Never Used  Vaping Use  . Vaping Use: Never used  Substance and Sexual Activity  . Alcohol use: No  . Drug use: No  . Sexual activity: Never  Other Topics Concern  . Not on file  Social History Narrative   She is a widowed mother of 2 (daughter and son).   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   She moved to [place] apparently back in 2016, but was visiting family in [place] and was diagnosed with Cardiomyopathy-EF 18%.   She is usually accompanied by her daughter.    At baseline, she walks roughly 1-2 miles a day.   Social Determinants of Health   Financial Resource Strain: Not on file  Food Insecurity: Not on file  Transportation Needs: Not on file  Physical Activity: Not on file  Stress: Not on file  Social Connections: Not on file    Review of Systems: A 12 point ROS discussed and pertinent positives are indicated in the HPI above.  All other systems are  negative.  Review of Systems  Constitutional: Negative for fatigue and fever.  Respiratory: Negative for cough and shortness of breath.   Cardiovascular: Negative for chest pain.  Gastrointestinal: Negative for abdominal pain.  Musculoskeletal: Positive for back pain.  Psychiatric/Behavioral: Negative for behavioral problems and confusion.    Vital Signs: BP (!) 126/106   Pulse 76   Temp 98.9 F (37.2 C) (Oral)   Resp 19   Ht 5' 7.25" (1.708 m)   Wt 222 lb (100.7 kg)   SpO2 99%   BMI 34.51 kg/m   Physical Exam Vitals reviewed.  HENT:     Mouth/Throat:     Mouth: Mucous membranes are moist.  Cardiovascular:     Rate and Rhythm: Normal rate and regular rhythm.     Heart sounds: Normal heart sounds.  Pulmonary:     Effort: Pulmonary effort is normal.     Breath sounds: Normal breath sounds.  Abdominal:     Palpations: Abdomen is soft.  Musculoskeletal:        General: Normal range of motion.     Right lower leg: No edema.     Left lower leg: No edema.     Comments: Rt hip pain -- severe  Skin:    General: Skin is warm.  Neurological:     Mental Status: She is alert and oriented to person, place, and time.  Psychiatric:        Behavior: Behavior normal.     Imaging: CT ABDOMEN PELVIS W CONTRAST  Result Date: 08/15/2020 CLINICAL DATA:  Lymphadenopathy. EXAM: CT ABDOMEN AND PELVIS WITH CONTRAST TECHNIQUE: Multidetector CT imaging of the abdomen and pelvis was performed using the standard protocol following bolus administration of intravenous contrast. CONTRAST:  144mL ISOVUE-300 IOPAMIDOL (ISOVUE-300) INJECTION 61% COMPARISON:  July 23, 2020. FINDINGS: Lower chest: No acute abnormality. Hepatobiliary: Small gallstone is noted. The liver is unremarkable. No biliary dilatation is noted. Pancreas: Unremarkable. No pancreatic ductal dilatation or surrounding inflammatory changes. Spleen: Normal in size without focal abnormality. Adrenals/Urinary Tract: Adrenal glands  appear normal. Left renal cortical scarring is noted. No hydronephrosis or renal obstruction is noted. No renal or ureteral  calculi are noted. Urinary bladder is unremarkable. Stomach/Bowel: Stomach is within normal limits. Appendix appears normal. No evidence of bowel wall thickening, distention, or inflammatory changes. Vascular/Lymphatic: Aortic atherosclerosis. Right iliac and periaortic adenopathy is noted. Largest lymph node measures 2.4 cm in right external iliac region. 2 cm right internal iliac lymph node is noted. 1.3 cm retrocaval lymph node is noted. Reproductive: Enlarged fibroid uterus is noted. No definite adnexal abnormality is noted. Other: No abdominal wall hernia or abnormality. No abdominopelvic ascites. There does appear to be abnormal soft tissue mass both anterior and posterior to the inferior portion of the right iliac wing concerning for malignancy or metastatic disease. The mass posterior to the iliac wing measures 8.6 x 4.4 cm. Musculoskeletal: Irregular lucency with sclerotic margins is seen involving the right iliac crest concerning for possible lytic lesion. IMPRESSION: 1. Right iliac and periaortic adenopathy is noted concerning for metastatic disease or lymphoma. Also noted is abnormal soft tissue mass anterior and posterior to the right iliac wing concerning for malignancy or metastatic disease. MRI is recommended for further evaluation. 2. Irregular lucency with sclerotic margins is seen involving the right iliac crest concerning for possible lytic lesion. MRI may be performed for further evaluation. 3. Enlarged fibroid uterus. 4. Small gallstone. 5. Aortic atherosclerosis. These results will be called to the ordering clinician or representative by the Radiologist Assistant, and communication documented in the PACS or zVision Dashboard. Aortic Atherosclerosis (ICD10-I70.0). Electronically Signed   By: Marijo Conception M.D.   On: 08/15/2020 16:44    Labs:  CBC: Recent Labs     02/16/20 0900 06/23/20 1004 08/16/20 1013  WBC 6.6 6.7 7.1  HGB 11.6* 12.6 10.6*  HCT 36.1 37.9 32.7*  PLT 337 364.0 401*    COAGS: No results for input(s): INR, APTT in the last 8760 hours.  BMP: Recent Labs    02/16/20 0900 06/23/20 1004 08/16/20 1013  NA 143 141 142  K 4.2 4.5 4.0  CL 110 107 108  CO2 24 28 28   GLUCOSE 117* 100* 105*  BUN 18 12 16   CALCIUM 9.5 9.6 9.6  CREATININE 1.01* 0.79 0.83  GFRNONAA >60  --  >60  GFRAA >60  --   --     LIVER FUNCTION TESTS: Recent Labs    02/16/20 0900 08/16/20 1013  BILITOT 0.5 0.4  AST 27 22  ALT 22 18  ALKPHOS 112 87  PROT 7.9 7.4  ALBUMIN 3.8 3.8    TUMOR MARKERS: No results for input(s): AFPTM, CEA, CA199, CHROMGRNA in the last 8760 hours.  Assessment and Plan:  Hx Breast Ca 2018 Rt hip pain off and on since 2020 Progressively worse-- severe x 8 weeks or so Imaging revealing Lymphadenopathy and Rt gluteal ST mass Scheduled for ST mass biopsy Risks and benefits of right gluteal soft tissue mass biopsy was discussed with the patient and/or patient's family including, but not limited to bleeding, infection, damage to adjacent structures or low yield requiring additional tests.  All of the questions were answered and there is agreement to proceed. Consent signed and in chart.   Thank you for this interesting consult.  I greatly enjoyed meeting Shadavia Dampier Aloha and look forward to participating in their care.  A copy of this report was sent to the requesting provider on this date.  Electronically Signed: Lavonia Drafts, PA-C 08/22/2020, 10:22 AM   I spent a total of  30 Minutes   in face to face in clinical consultation,  greater than 50% of which was counseling/coordinating care for Rt ST mass bx

## 2020-08-22 NOTE — Discharge Instructions (Signed)

## 2020-08-23 ENCOUNTER — Ambulatory Visit: Payer: Medicare Other | Admitting: Orthopaedic Surgery

## 2020-08-23 ENCOUNTER — Telehealth: Payer: Self-pay | Admitting: Hematology

## 2020-08-23 NOTE — Telephone Encounter (Signed)
No 1/11 los. No changes made to pt's schedule.  

## 2020-08-23 NOTE — Progress Notes (Signed)
[name]   [phone] [phone]   Clinic Follow up Note   Patient Care Team: Girtha Rm, NP-C as PCP - General (Family Medicine) [name], MD as PCP - Cardiology (Cardiology) [name], MD as Consulting Physician (Hematology) [name], MD as Consulting Physician (General Surgery)   I connected with Kristin Ward on 08/24/2020 at 11:20 AM EST by telephone visit and verified that I am speaking with the correct person using two identifiers.  I discussed the limitations, risks, security and privacy concerns of performing an evaluation and management service by telephone and the availability of in person appointments. I also discussed with the patient that there may be a patient responsible charge related to this service. The patient expressed understanding and agreed to proceed.   Other persons participating in the visit and their role in the encounter:  None   Patient's location:  Home  Provider's location:  Office   CHIEF COMPLAINT: f/u pain control and discuss biopsy result   SUMMARY OF ONCOLOGIC HISTORY: Oncology History Overview Note  Cancer Staging Malignant neoplasm of lower-outer quadrant of left breast of female, estrogen receptor negative ([place]) Staging form: Breast, AJCC 8th Edition - Clinical stage from 06/06/2017: Stage IB (cT1c, cN0, cM0, G3, ER: Negative, PR: Negative, HER2: Negative) - Signed by [name], MD on 06/15/2017 - Pathologic stage from 06/26/2017: Stage IB (pT1c, pN0, cM0, G3, ER: Negative, PR: Negative, HER2: Negative) - Signed by [name], MD on 07/10/2017     Malignant neoplasm of lower-outer quadrant of left breast of female, estrogen receptor negative ([place])  05/30/2017 Mammogram   Diagnostic mammo and US IMPRESSION: 1. Highly suspicious 1.4 cm mass in the slightly lower slightly outer left breast -tissue sampling recommended. 2. Indeterminate 0.5 mm mass in the slightly lower slightly  outer left breast -tissue sampling recommended. 3. At least 2 left axillary lymph nodes with borderline cortical thickness.   06/06/2017 Initial Biopsy   Diagnosis 1. Breast, left, needle core biopsy, 5:30 o'clock - INVASIVE DUCTAL CARCINOMA, G3 2. Lymph node, needle/core biopsy, left axillary - NO CARCINOMA IDENTIFIED IN ONE LYMPH NODE (0/1)   06/06/2017 Initial Diagnosis   Malignant neoplasm of lower-outer quadrant of left breast of female, estrogen receptor negative ([place])   06/06/2017 Receptors her2   Estrogen Receptor: 0%, NEGATIVE Progesterone Receptor: 0%, NEGATIVE Proliferation Marker Ki67: 70%   06/26/2017 Surgery   LEFT BREAST LUMPECTOMY WITH RADIOACTIVE SEED AND SENTINEL LYMPH NODE BIOPSY ERAS  PATHWAY AND INSERTION PORT-A-CATH By Dr. Barry Dienes on 06/26/17    06/26/2017 Pathology Results   Diagnosis 06/26/17  1. Breast, lumpectomy, Left - INVASIVE DUCTAL CARCINOMA, GRADE III/III, SPANNING 1.2 CM. - THE SURGICAL RESECTION MARGINS ARE NEGATIVE FOR CARCINOMA. - SEE ONCOLOGY TABLE BELOW. 2. Lymph node, sentinel, biopsy, Left axillary #1 - THERE IS NO EVIDENCE OF CARCINOMA IN 1 OF 1 LYMPH NODE (0/1). 3. Lymph node, sentinel, biopsy, Left axillary #2 - THERE IS NO EVIDENCE OF CARCINOMA IN 1 OF 1 LYMPH NODE (0/1). 4. Lymph node, sentinel, biopsy, Left axillary #3 - THERE IS NO EVIDENCE OF CARCINOMA IN 1 OF 1 LYMPH NODE (0/1).    07/25/2017 - 09/29/2017 Chemotherapy   Adjuvant cytoxan and docetaxel (TC) every 3 weekd for 4 cycles     11/19/2017 - 12/17/2017 Radiation Therapy   Adjuvant breast radiation Left breast treated to 42.5 Gy with 17 fx of 2.5 Gy followed by a boost of 7.5 Gy with 3 fx of 2.5 Gy  02/2019 Procedure   She had PAC removal in 02/2019.       CURRENT THERAPY:  Pending radiation and chemo   INTERVAL HISTORY:  Kristin Ward is scheduled for a phone visit to discuss her recent biopsy results.  She identified herself by date of birth and home  address.  Her pain is much better controlled with the long-acting MS Contin, she has been able to sleep well at night.  She is still constipated, on Linzess.  No other new complaints.  All other systems were reviewed with the patient and are negative.  MEDICAL HISTORY:  Past Medical History:  Diagnosis Date  . Abnormal x-ray of lungs with single pulmonary nodule 03/15/2016   per records from [place]   . Anemia   . Asthma   . Cholelithiasis 05/19/2017   On CT  . Coronary artery calcification seen on CAT scan 05/19/2017  . Diabetes mellitus without complication ([place])    type 2  . Dilated idiopathic cardiomyopathy ([place]) 12/2015   EF 15-20%. Diagnosed in [facility]  . Headache    NONE RECENT  . Hypertension   . Lymph nodes enlarged 07/25/2020  . Malignant neoplasm of lower-outer quadrant of left breast of female, estrogen receptor negative ([place]) 06/11/2017   left breast  . Mixed hyperlipidemia 06/17/2018  . partially blind   . Personal history of chemotherapy 2018-2019  . Personal history of radiation therapy 2019  . Retinitis pigmentosa   . Retroperitoneal lymphadenopathy 07/25/2020  . Uveitis     SURGICAL HISTORY: Past Surgical History:  Procedure Laterality Date  . brain cyst removed  2007   to help with headaches per patient , aspirated   . BREAST BIOPSY Left 06/06/2017   x2  . BREAST CYST ASPIRATION Left 06/06/2017  . BREAST LUMPECTOMY Left 06/26/2017  . BREAST LUMPECTOMY WITH RADIOACTIVE SEED AND SENTINEL LYMPH NODE BIOPSY Left 06/26/2017   Procedure: BREAST LUMPECTOMY WITH RADIOACTIVE SEED AND SENTINEL LYMPH NODE BIOPSY ERAS  PATHWAY;  Surgeon: [name], MD;  Location: [place];  Service: General;  Laterality: Left;  pec block  . FINE NEEDLE ASPIRATION Left 02/23/2019   Procedure: FINE NEEDLE ASPIRATION;  Surgeon: [name], MD;  Location: [place];  Service: General;  Laterality: Left;  Asiration of left breast seroma   . NASAL  TURBINATE REDUCTION    . PORT-A-CATH REMOVAL Right 02/23/2019   Procedure: REMOVAL PORT-A-CATH;  Surgeon: [name], MD;  Location: [place];  Service: General;  Laterality: Right;  . PORTACATH PLACEMENT N/A 06/26/2017   Procedure: INSERTION PORT-A-CATH;  Surgeon: [name], MD;  Location: [place];  Service: General;  Laterality: N/A;  . TRANSTHORACIC ECHOCARDIOGRAM  12/2015   A) [facility] May 2017: Mild concentric LVH. Global hypokinesis. GR daily. EF 18% severe LA dilation. Mitral annular dilatation with papillary muscle dysfunction and moderate MR. Dilated IVC consistent with elevated RAP.  [facility] TRANSTHORACIC ECHOCARDIOGRAM  05/2017; 08/2017   a) Mild concentric LVH.  EF 45 to 50% with diffuse hypokinesis.  GR 1 DD.  Mild aortic root dilation.;; b)  Mild LVH EF 45 to 50%.  Diffuse HK.  No significant valve disease.  No significant change.    I have reviewed the social history and family history with the patient and they are unchanged from previous note.  ALLERGIES:  is allergic to latex, tylenol with codeine #3 [acetaminophen-codeine], and penicillins.  MEDICATIONS:  Current Outpatient Medications  Medication Sig Dispense Refill  . aspirin 81  MG chewable tablet Chew 81 mg by mouth daily.    . carvedilol (COREG) 12.5 MG tablet Take 1 tablet (12.5 mg total) by mouth 2 (two) times daily with a meal. 180 tablet 1  . Cholecalciferol (D3-1000 PO) Take by mouth.    . diclofenac (VOLTAREN) 75 MG EC tablet Take 1 tablet (75 mg total) by mouth 2 (two) times daily. 60 tablet 1  . ergocalciferol (VITAMIN D2) 1.25 MG (50000 UT) capsule Take 1 capsule (50,000 Units total) by mouth once a week. 12 capsule 0  . furosemide (LASIX) 20 MG tablet Take 2 tablets (40 mg total) by mouth as needed. FOR WEIGHT GAIN  3 TO 4 LBS. 90 tablet 3  . glucose blood (ACCU-CHEK GUIDE) test strip Use as instructed to test blood sugar 2 times a day 100 strip 2  . ibuprofen (ADVIL) 800 MG tablet  Take 1 tablet (800 mg total) by mouth every 8 (eight) hours as needed. 30 tablet 2  . linaclotide (LINZESS) 72 MCG capsule Take 1 capsule (72 mcg total) by mouth daily before breakfast. 30 capsule 1  . morphine (MS CONTIN) 15 MG 12 hr tablet Take 1 tablet (15 mg total) by mouth every 12 (twelve) hours. 30 tablet 0  . Multiple Vitamins-Minerals (WOMENS MULTI PO) Take by mouth.     . oxyCODONE (OXY IR/ROXICODONE) 5 MG immediate release tablet Take 1 tablet (5 mg total) by mouth every 6 (six) hours as needed for severe pain. 60 tablet 0  . oxyCODONE-acetaminophen (PERCOCET) 5-325 MG tablet Take as needed twice a day for severe pain, or before scan 20 tablet 0  . Saccharomyces boulardii (PROBIOTIC) 250 MG CAPS Take by mouth.    . sacubitril-valsartan (ENTRESTO) 97-103 MG Take 1 tablet by mouth 2 (two) times daily. 180 tablet 3  . Semaglutide (RYBELSUS) 3 MG TABS Take 3 mg by mouth daily with breakfast. 30 tablet 3  . spironolactone (ALDACTONE) 25 MG tablet Take 1 tablet (25 mg total) by mouth daily. 90 tablet 3  . traMADol (ULTRAM) 50 MG tablet Take 1-2 tabs po bid prn pain 30 tablet 0  . vitamin B-12 (CYANOCOBALAMIN) 500 MCG tablet Take 500 mcg by mouth daily.    . vitamin C (ASCORBIC ACID) 500 MG tablet Take 1,000 mg 2 (two) times daily by mouth.     No current facility-administered medications for this visit.    PHYSICAL EXAMINATION: ECOG PERFORMANCE STATUS: 2 - Symptomatic, <50% confined to bed  No vitals taken today, Exam not performed today   LABORATORY DATA:  I have reviewed the data as listed CBC Latest Ref Rng & Units 08/16/2020 06/23/2020 02/16/2020  WBC 4.0 - 10.5 K/uL 7.1 6.7 6.6  Hemoglobin 12.0 - 15.0 g/dL 10.6(L) 12.6 11.6(L)  Hematocrit 36.0 - 46.0 % 32.7(L) 37.9 36.1  Platelets 150 - 400 K/uL 401(H) 364.0 337     CMP Latest Ref Rng & Units 08/16/2020 06/23/2020 02/16/2020  Glucose 70 - 99 mg/dL 105(H) 100(H) 117(H)  BUN 8 - 23 mg/dL '16 12 18  ' Creatinine 0.44 - 1.00 mg/dL 0.83  0.79 1.01(H)  Sodium 135 - 145 mmol/L 142 141 143  Potassium 3.5 - 5.1 mmol/L 4.0 4.5 4.2  Chloride 98 - 111 mmol/L 108 107 110  CO2 22 - 32 mmol/L '28 28 24  ' Calcium 8.9 - 10.3 mg/dL 9.6 9.6 9.5  Total Protein 6.5 - 8.1 g/dL 7.4 - 7.9  Total Bilirubin 0.3 - 1.2 mg/dL 0.4 - 0.5  Alkaline Phos 38 -  126 U/L 87 - 112  AST 15 - 41 U/L 22 - 27  ALT 0 - 44 U/L 18 - 22      RADIOGRAPHIC STUDIES: I have personally reviewed the radiological images as listed and agreed with the findings in the report. No results found.   ASSESSMENT & PLAN:  Kristin Ward is a 65 y.o. female with   1.  Intractable right hip pain -her right hip pain is caused by the large infiltrative mass around the right iliac wing, status post biopsy on 08/22/20  -I called in MS Contin 15 mg every 12 hours (08/22/20), and refilled her oxycodone -Her pain is much better controlled now, tolerable. -We will refer her to radiation oncology for palliative radiation   2. 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  I spoke with pathologist Dr. Melina Copa today  -I requested ER, PR and HER2, and PD-L1 her biopsy sample, result will be back next week -If this is triple negative disease, and PD-L1 positive, I recommend systemic chemotherapy with weekly Taxol and Keytruda.  I discussed the benefit and side effects with her briefly today.  She is interested -will finalize her treatment plan next week when molecular testing results is back   Plan -Her biopsy confirmed metastatic breast cancer, prognostic markers are pending -Rad/onc referral  -plan to see her back when ER/PR/HER2 and PD-1 results returns    No problem-specific Assessment & Plan notes found for this  encounter.   No orders of the defined types were placed in this encounter.  I discussed the assessment and treatment plan with the patient. The patient was provided an opportunity to ask questions and all were answered. The patient agreed with the plan and demonstrated an understanding of the instructions.  The patient was advised to call back or seek an in-person evaluation if the symptoms worsen or if the condition fails to improve as anticipated.  The total time spent in the appointment was 25 minutes.    [name], MD 08/24/2020   I, Joslyn Devon, am acting as scribe for [name], MD.   I have reviewed the above documentation for accuracy and completeness, and I agree with the above.

## 2020-08-24 ENCOUNTER — Encounter: Payer: Self-pay | Admitting: Hematology

## 2020-08-24 ENCOUNTER — Inpatient Hospital Stay (HOSPITAL_BASED_OUTPATIENT_CLINIC_OR_DEPARTMENT_OTHER): Payer: Medicare Other | Admitting: Hematology

## 2020-08-24 DIAGNOSIS — Z171 Estrogen receptor negative status [ER-]: Secondary | ICD-10-CM | POA: Diagnosis not present

## 2020-08-24 DIAGNOSIS — C50512 Malignant neoplasm of lower-outer quadrant of left female breast: Secondary | ICD-10-CM | POA: Diagnosis not present

## 2020-08-25 LAB — SURGICAL PATHOLOGY

## 2020-08-28 ENCOUNTER — Telehealth: Payer: Self-pay | Admitting: Nurse Practitioner

## 2020-08-28 ENCOUNTER — Ambulatory Visit (HOSPITAL_COMMUNITY): Payer: Medicare Other

## 2020-08-28 NOTE — Telephone Encounter (Signed)
Scheduled appt per 1/13 los. Pt to get updated appt calendar at next visit per appt notes.

## 2020-08-30 ENCOUNTER — Ambulatory Visit
Admission: RE | Admit: 2020-08-30 | Discharge: 2020-08-30 | Disposition: A | Discharge status: Home / Self Care | Payer: Medicare Other | Source: Ambulatory Visit | Attending: Radiation Oncology | Admitting: Radiation Oncology

## 2020-08-30 ENCOUNTER — Encounter: Payer: Self-pay | Admitting: Radiation Oncology

## 2020-08-30 ENCOUNTER — Other Ambulatory Visit: Payer: Self-pay

## 2020-08-30 VITALS — BP 94/67 | HR 96 | Temp 97.8°F | Resp 20 | Ht 67.0 in | Wt 213.0 lb

## 2020-08-30 DIAGNOSIS — C7951 Secondary malignant neoplasm of bone: Secondary | ICD-10-CM | POA: Diagnosis not present

## 2020-08-30 DIAGNOSIS — Z171 Estrogen receptor negative status [ER-]: Secondary | ICD-10-CM | POA: Insufficient documentation

## 2020-08-30 DIAGNOSIS — J45909 Unspecified asthma, uncomplicated: Secondary | ICD-10-CM | POA: Diagnosis not present

## 2020-08-30 DIAGNOSIS — Z800 Family history of malignant neoplasm of digestive organs: Secondary | ICD-10-CM | POA: Insufficient documentation

## 2020-08-30 DIAGNOSIS — Z803 Family history of malignant neoplasm of breast: Secondary | ICD-10-CM | POA: Insufficient documentation

## 2020-08-30 DIAGNOSIS — I420 Dilated cardiomyopathy: Secondary | ICD-10-CM | POA: Insufficient documentation

## 2020-08-30 DIAGNOSIS — G893 Neoplasm related pain (acute) (chronic): Secondary | ICD-10-CM | POA: Diagnosis not present

## 2020-08-30 DIAGNOSIS — Z923 Personal history of irradiation: Secondary | ICD-10-CM | POA: Diagnosis not present

## 2020-08-30 DIAGNOSIS — I700 Atherosclerosis of aorta: Secondary | ICD-10-CM | POA: Diagnosis not present

## 2020-08-30 DIAGNOSIS — Z87891 Personal history of nicotine dependence: Secondary | ICD-10-CM | POA: Insufficient documentation

## 2020-08-30 DIAGNOSIS — C50512 Malignant neoplasm of lower-outer quadrant of left female breast: Secondary | ICD-10-CM | POA: Insufficient documentation

## 2020-08-30 DIAGNOSIS — I2510 Atherosclerotic heart disease of native coronary artery without angina pectoris: Secondary | ICD-10-CM | POA: Insufficient documentation

## 2020-08-30 DIAGNOSIS — I10 Essential (primary) hypertension: Secondary | ICD-10-CM | POA: Insufficient documentation

## 2020-08-30 DIAGNOSIS — Z08 Encounter for follow-up examination after completed treatment for malignant neoplasm: Secondary | ICD-10-CM | POA: Diagnosis not present

## 2020-08-30 DIAGNOSIS — Z7982 Long term (current) use of aspirin: Secondary | ICD-10-CM | POA: Diagnosis not present

## 2020-08-30 DIAGNOSIS — Z9221 Personal history of antineoplastic chemotherapy: Secondary | ICD-10-CM | POA: Insufficient documentation

## 2020-08-30 DIAGNOSIS — Z79899 Other long term (current) drug therapy: Secondary | ICD-10-CM | POA: Insufficient documentation

## 2020-08-30 DIAGNOSIS — E782 Mixed hyperlipidemia: Secondary | ICD-10-CM | POA: Diagnosis not present

## 2020-08-30 DIAGNOSIS — Z510 Encounter for antineoplastic radiation therapy: Secondary | ICD-10-CM | POA: Diagnosis not present

## 2020-08-30 DIAGNOSIS — E119 Type 2 diabetes mellitus without complications: Secondary | ICD-10-CM | POA: Insufficient documentation

## 2020-08-30 MED ORDER — ONDANSETRON HCL 8 MG PO TABS: 8.0000 mg | ORAL_TABLET | Freq: Three times a day (TID) | ORAL | 0 refills | Status: DC | PRN

## 2020-08-30 NOTE — Progress Notes (Signed)
Radiation Oncology         [phone] ________________________________  Name: Kristin Ward        MRN: [id number]  Date of Service: 08/30/2020 DOB: Jun 08, 1956  [id number], Laurian Brim, NP-C  [name], MD     REFERRING PHYSICIAN: [name], MD   DIAGNOSIS: The primary encounter diagnosis was Malignant neoplasm of lower-outer quadrant of left breast of female, estrogen receptor negative ([place]). Diagnoses of Bone metastasis ([place]) and Pain from bone metastases ([place]) were also pertinent to this visit.   HISTORY OF PRESENT ILLNESS: Kristin Ward is a 65 y.o. 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 She underwent left lumpectomy with sentinel node biopsy on 06/26/17 revealing a grade 3 1.2 cm invasive ductal carcinoma with all margins negative for disease, and no involvement of the three sampled notes. She went on to begin adjuvant chemotherapy on 07/25/17, and her last treatment was on 09/29/17 to complete 4 cycles of taxotere/cytoxan. She completed adjuvant radiotherapy and has been followed in surveillance. She recently developed hip pain and subsequent work up showed a large infiltrated mass int he right iliac wing and a biopsy on 08/22/20 showed metastatic carcinoma consistent with breast carcinoma, her prognostic markers confirmed ER positivity at 20% with weak staining, PR negative. She plans to proceed with additional systemic therapy and is seen today to discuss palliative radiotherapy to her right hip/iliac region.   PREVIOUS RADIATION THERAPY:  11/19/17 - 12/17/17: Left breast treated to 42.56 Gy with 17 fx of 2.5 Gy followed by a boost of 7.5 Gy with 3 fx of 2.5 Gy  PAST MEDICAL HISTORY:  Past Medical History:  Diagnosis Date  . Abnormal x-ray of lungs with single pulmonary nodule 03/15/2016   per records from [place]   . Anemia   . Asthma   . Cholelithiasis 05/19/2017   On CT  . Coronary artery calcification seen on  CAT scan 05/19/2017  . Diabetes mellitus without complication ([place])    type 2  . Dilated idiopathic cardiomyopathy ([place]) 12/2015   EF 15-20%. Diagnosed in [facility]  . Headache    NONE RECENT  . Hypertension   . Lymph nodes enlarged 07/25/2020  . Malignant neoplasm of lower-outer quadrant of left breast of female, estrogen receptor negative ([place]) 06/11/2017   left breast  . Mixed hyperlipidemia 06/17/2018  . partially blind   . Personal history of chemotherapy 2018-2019  . Personal history of radiation therapy 2019  . Retinitis pigmentosa   . Retroperitoneal lymphadenopathy 07/25/2020  . Uveitis        PAST SURGICAL HISTORY: Past Surgical History:  Procedure Laterality Date  . brain cyst removed  2007   to help with headaches per patient , aspirated   . BREAST BIOPSY Left 06/06/2017   x2  . BREAST CYST ASPIRATION Left 06/06/2017  . BREAST LUMPECTOMY Left 06/26/2017  . BREAST LUMPECTOMY WITH RADIOACTIVE SEED AND SENTINEL LYMPH NODE BIOPSY Left 06/26/2017   Procedure: BREAST LUMPECTOMY WITH RADIOACTIVE SEED AND SENTINEL LYMPH NODE BIOPSY ERAS  PATHWAY;  Surgeon: [name], MD;  Location: [place];  Service: General;  Laterality: Left;  pec block  . FINE NEEDLE ASPIRATION Left 02/23/2019   Procedure: FINE NEEDLE ASPIRATION;  Surgeon: [name], MD;  Location: [place];  Service: General;  Laterality: Left;  Asiration of left breast seroma   . NASAL TURBINATE REDUCTION    . PORT-A-CATH REMOVAL Right 02/23/2019  Procedure: REMOVAL PORT-A-CATH;  Surgeon: [name], MD;  Location: [place];  Service: General;  Laterality: Right;  . PORTACATH PLACEMENT N/A 06/26/2017   Procedure: INSERTION PORT-A-CATH;  Surgeon: [name], MD;  Location: [place];  Service: General;  Laterality: N/A;  . TRANSTHORACIC ECHOCARDIOGRAM  12/2015   A) [facility] May 2017: Mild concentric LVH. Global hypokinesis. GR daily. EF 18% severe  LA dilation. Mitral annular dilatation with papillary muscle dysfunction and moderate MR. Dilated IVC consistent with elevated RAP.  [facility] TRANSTHORACIC ECHOCARDIOGRAM  05/2017; 08/2017   a) Mild concentric LVH.  EF 45 to 50% with diffuse hypokinesis.  GR 1 DD.  Mild aortic root dilation.;; b)  Mild LVH EF 45 to 50%.  Diffuse HK.  No significant valve disease.  No significant change.     FAMILY HISTORY:  Family History  Problem Relation Age of Onset  . Colon cancer Mother 53       deceased 24  . Heart attack Father   . Breast cancer Other 78       2nd cousin on maternal side; currently 71  . Pancreatic cancer Other        2nd cousin once removed on maternal side; deceased 68s  . Fibromyalgia Sister   . Esophageal cancer Neg Hx   . Rectal cancer Neg Hx   . Stomach cancer Neg Hx      SOCIAL HISTORY:  reports that she quit smoking about 4 years ago. Her smoking use included cigarettes. She has a 5.75 pack-year smoking history. She has never used smokeless tobacco. She reports that she does not drink alcohol and does not use drugs. The patient is widowed and lives in [place]. She is accompanied by a friend . 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 She relocated to [place] a few years ago.  ALLERGIES: Latex, Tylenol with codeine #3 [acetaminophen-codeine], and Penicillins   MEDICATIONS:  Current Outpatient Medications  Medication Sig Dispense Refill  . aspirin 81 MG chewable tablet Chew 81 mg by mouth daily.    . carvedilol (COREG) 12.5 MG tablet Take 1 tablet (12.5 mg total) by mouth 2 (two) times daily with a meal. 180 tablet 1  . Cholecalciferol (D3-1000 PO) Take by mouth.    . diclofenac (VOLTAREN) 75 MG EC tablet Take 1 tablet (75 mg total) by mouth 2 (two) times daily. 60 tablet 1  . ergocalciferol (VITAMIN D2) 1.25 MG (50000 UT) capsule Take 1 capsule (50,000 Units total) by mouth once a week. 12 capsule 0  . furosemide  (LASIX) 20 MG tablet Take 2 tablets (40 mg total) by mouth as needed. FOR WEIGHT GAIN  3 TO 4 LBS. 90 tablet 3  . glucose blood (ACCU-CHEK GUIDE) test strip Use as instructed to test blood sugar 2 times a day 100 strip 2  . ibuprofen (ADVIL) 800 MG tablet Take 1 tablet (800 mg total) by mouth every 8 (eight) hours as needed. 30 tablet 2  . linaclotide (LINZESS) 72 MCG capsule Take 1 capsule (72 mcg total) by mouth daily before breakfast. 30 capsule 1  . morphine (MS CONTIN) 15 MG 12 hr tablet Take 1 tablet (15 mg total) by mouth every 12 (twelve) hours. 30 tablet 0  . Multiple Vitamins-Minerals (WOMENS MULTI PO) Take by mouth.     . oxyCODONE (OXY IR/ROXICODONE) 5 MG immediate release tablet Take 1 tablet (5 mg total) by mouth every 6 (  six) hours as needed for severe pain. 60 tablet 0  . oxyCODONE-acetaminophen (PERCOCET) 5-325 MG tablet Take as needed twice a day for severe pain, or before scan 20 tablet 0  . Saccharomyces boulardii (PROBIOTIC) 250 MG CAPS Take by mouth.    . sacubitril-valsartan (ENTRESTO) 97-103 MG Take 1 tablet by mouth 2 (two) times daily. 180 tablet 3  . Semaglutide (RYBELSUS) 3 MG TABS Take 3 mg by mouth daily with breakfast. 30 tablet 3  . spironolactone (ALDACTONE) 25 MG tablet Take 1 tablet (25 mg total) by mouth daily. 90 tablet 3  . traMADol (ULTRAM) 50 MG tablet Take 1-2 tabs po bid prn pain 30 tablet 0  . vitamin B-12 (CYANOCOBALAMIN) 500 MCG tablet Take 500 mcg by mouth daily.    . vitamin C (ASCORBIC ACID) 500 MG tablet Take 1,000 mg 2 (two) times daily by mouth.     No current facility-administered medications for this encounter.     REVIEW OF SYSTEMS: On review of systems, the patient reports that she is doing okay. She denies any pain that is bad enough to take morphine extended release at this time and has only been taking oxycodone once in a while. She rather takes ibuprofen and finds this helps minimally. Most of her pain is a deep gnawing and throbbing pain  in her right hip region. She denies any burning or shooting quality. No other complaints are noted.    PHYSICAL EXAM:  Wt Readings from Last 3 Encounters:  08/22/20 222 lb (100.7 kg)  08/22/20 221 lb 14.4 oz (100.7 kg)  08/16/20 222 lb 14.4 oz (101.1 kg)   Temp Readings from Last 3 Encounters:  08/22/20 98.9 F (37.2 C) (Oral)  08/22/20 98.2 F (36.8 C) (Tympanic)  08/16/20 (!) 97 F (36.1 C) (Tympanic)   BP Readings from Last 3 Encounters:  08/22/20 140/80  08/22/20 135/80  08/16/20 (!) 128/91   Pulse Readings from Last 3 Encounters:  08/22/20 87  08/22/20 83  08/16/20 72   In general this is a well appearing African American female in no acute distress. She's alert and oriented x4 and appropriate throughout the examination. Cardiopulmonary assessment is negative for acute distress and she exhibits normal effort.      ECOG = 0  0 - Asymptomatic (Fully active, able to carry on all predisease activities without restriction)  1 - Symptomatic but completely ambulatory (Restricted in physically strenuous activity but ambulatory and able to carry out work of a light or sedentary nature. For example, light housework, office work)  2 - Symptomatic, <50% in bed during the day (Ambulatory and capable of all self care but unable to carry out any work activities. Up and about more than 50% of waking hours)  3 - Symptomatic, >50% in bed, but not bedbound (Capable of only limited self-care, confined to bed or chair 50% or more of waking hours)  4 - Bedbound (Completely disabled. Cannot carry on any self-care. Totally confined to bed or chair)  5 - Death   Eustace Pen MM, Creech RH, T[place]ey DC, et al. [phone]). "Toxicity and response criteria of the [facility] Group". [place] Oncol. 5 (6): 649-55    LABORATORY DATA:  Lab Results  Component Value Date   WBC 7.1 08/16/2020   HGB 10.6 (L) 08/16/2020   HCT 32.7 (L) 08/16/2020   MCV 89.3 08/16/2020   PLT 401 (H)  08/16/2020   Lab Results  Component Value Date   NA 142 08/16/2020  K 4.0 08/16/2020   CL 108 08/16/2020   CO2 28 08/16/2020   Lab Results  Component Value Date   ALT 18 08/16/2020   AST 22 08/16/2020   ALKPHOS 87 08/16/2020   BILITOT 0.4 08/16/2020      RADIOGRAPHY: CT ABDOMEN PELVIS W CONTRAST  Result Date: 08/15/2020 CLINICAL DATA:  Lymphadenopathy. EXAM: CT ABDOMEN AND PELVIS WITH CONTRAST TECHNIQUE: Multidetector CT imaging of the abdomen and pelvis was performed using the standard protocol following bolus administration of intravenous contrast. CONTRAST:  175mL ISOVUE-300 IOPAMIDOL (ISOVUE-300) INJECTION 61% COMPARISON:  July 23, 2020. FINDINGS: Lower chest: No acute abnormality. Hepatobiliary: Small gallstone is noted. The liver is unremarkable. No biliary dilatation is noted. Pancreas: Unremarkable. No pancreatic ductal dilatation or surrounding inflammatory changes. Spleen: Normal in size without focal abnormality. Adrenals/Urinary Tract: Adrenal glands appear normal. Left renal cortical scarring is noted. No hydronephrosis or renal obstruction is noted. No renal or ureteral calculi are noted. Urinary bladder is unremarkable. Stomach/Bowel: Stomach is within normal limits. Appendix appears normal. No evidence of bowel wall thickening, distention, or inflammatory changes. Vascular/Lymphatic: Aortic atherosclerosis. Right iliac and periaortic adenopathy is noted. Largest lymph node measures 2.4 cm in right external iliac region. 2 cm right internal iliac lymph node is noted. 1.3 cm retrocaval lymph node is noted. Reproductive: Enlarged fibroid uterus is noted. No definite adnexal abnormality is noted. Other: No abdominal wall hernia or abnormality. No abdominopelvic ascites. There does appear to be abnormal soft tissue mass both anterior and posterior to the inferior portion of the right iliac wing concerning for malignancy or metastatic disease. The mass posterior to the iliac wing  measures 8.6 x 4.4 cm. Musculoskeletal: Irregular lucency with sclerotic margins is seen involving the right iliac crest concerning for possible lytic lesion. IMPRESSION: 1. Right iliac and periaortic adenopathy is noted concerning for metastatic disease or lymphoma. Also noted is abnormal soft tissue mass anterior and posterior to the right iliac wing concerning for malignancy or metastatic disease. MRI is recommended for further evaluation. 2. Irregular lucency with sclerotic margins is seen involving the right iliac crest concerning for possible lytic lesion. MRI may be performed for further evaluation. 3. Enlarged fibroid uterus. 4. Small gallstone. 5. Aortic atherosclerosis. These results will be called to the ordering clinician or representative by the Radiologist Assistant, and communication documented in the PACS or zVision Dashboard. Aortic Atherosclerosis (ICD10-I70.0). Electronically Signed   By: Marijo Conception M.D.   On: 08/15/2020 16:44   CT Biopsy  Result Date: 08/22/2020 INDICATION: 65 year old female with lymphadenopathy and a painful soft tissue mass within the right gluteus minimus muscle. She presents for CT-guided biopsy of the same. EXAM: CT-guided biopsy of right gluteus minimus muscle. MEDICATIONS: None. ANESTHESIA/SEDATION: Moderate (conscious) sedation was employed during this procedure. A total of Versed 2 mg and Fentanyl 100 mcg was administered intravenously. Moderate Sedation Time: 10 minutes. The patient's level of consciousness and vital signs were monitored continuously by radiology nursing throughout the procedure under my direct supervision. FLUOROSCOPY TIME:  None. COMPLICATIONS: None immediate. PROCEDURE: Informed written consent was obtained from the patient after a thorough discussion of the procedural risks, benefits and alternatives. All questions were addressed. Maximal Sterile Barrier Technique was utilized including caps, mask, sterile gowns, sterile gloves, sterile  drape, hand hygiene and skin antiseptic. A timeout was performed prior to the initiation of the procedure. A planning axial CT scan was performed. The mass within the right gluteus minimus muscle was successfully identified. A suitable skin entry  site was selected and marked. The overlying skin was sterilely prepped and draped in the standard fashion. Local anesthesia was attained by infiltration with 1% lidocaine. A small dermatotomy was made. Under intermittent CT guidance, a 17 gauge introducer needle was advanced through the right lower quadrant abdominal wall and into the margin of the mass. Multiple 18 gauge core biopsies were then obtained coaxially using the BioPince automated biopsy device. Biopsy specimens were placed in formalin and delivered to pathology for further analysis. Post biopsy CT imaging demonstrates no evidence of complication. The patient tolerated the procedure well. IMPRESSION: Successful CT-guided biopsy of right gluteus minimus soft tissue mass. Electronically Signed   By: Jacqulynn Cadet M.D.   On: 08/22/2020 16:01       IMPRESSION/PLAN: 1. 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 Dr. Lisbeth Renshaw discusses the pathology findings and reviews the nature of metastatic breast disease. Dr. Lisbeth Renshaw reviews her films and would recommend a palliative course of radiotherapy to the right hip. We discussed the risks, benefits, short, and long term effects of radiotherapy, as well as the palliative intent, and the patient is interested in proceeding. Dr. Lisbeth Renshaw discusses the delivery and logistics of radiotherapy and anticipates a course of 3 weeks of radiotherapy to the right hip/iliac region as she will likely begin systemic therapy in the next week or so. Written consent is obtained and placed in the chart, a copy was provided to the patient. She will proceed with simulation this afternoon. She was able to  eat something so she could avoid nausea when taking pain medication prior to her treatment planning. 2. Pain from bone metastasis. The patient was encouraged to take her pain medication if needed, but if she does, I think she should take oxycodone rather than morphine, since she's not exhausting dosing parameters for her oxycodone. She is in agreement with this plan. We discussed taking this prior to treatment so she can be more comfortable when laying on the radiation treatment table.  In a visit lasting 60 minutes, greater than 50% of the time was spent face to face discussing the patient's condition, in preparation for the discussion, and coordinating the patient's care.  The above documentation reflects my direct findings during this shared patient visit. Please see the separate note by Dr. Lisbeth Renshaw on this date for the remainder of the patient's plan of care.    Carola Rhine, PAC

## 2020-08-30 NOTE — Progress Notes (Incomplete)
[place]   [phone]) [phone] [phone]) [phone]   Clinic Follow up Note   Patient Care Team: Girtha Rm, NP-C as PCP - General (Family Medicine) [name], MD as PCP - Cardiology (Cardiology) [name], MD as Consulting Physician (Hematology) [name], MD as Consulting Physician (General Surgery)  Date of Service:  08/30/2020  CHIEF COMPLAINT: f/u ofleft breastcancer and pain control   SUMMARY OF ONCOLOGIC HISTORY: Oncology History Overview Note  Cancer Staging Malignant neoplasm of lower-outer quadrant of left breast of female, estrogen receptor negative ([place]) Staging form: Breast, AJCC 8th Edition - Clinical stage from 06/06/2017: Stage IB (cT1c, cN0, cM0, G3, ER: Negative, PR: Negative, HER2: Negative) - Signed by [name], MD on 06/15/2017 - Pathologic stage from 06/26/2017: Stage IB (pT1c, pN0, cM0, G3, ER: Negative, PR: Negative, HER2: Negative) - Signed by [name], MD on 07/10/2017     Malignant neoplasm of lower-outer quadrant of left breast of female, estrogen receptor negative ([place])  05/30/2017 Mammogram   Diagnostic mammo and US IMPRESSION: 1. Highly suspicious 1.4 cm mass in the slightly lower slightly outer left breast -tissue sampling recommended. 2. Indeterminate 0.5 mm mass in the slightly lower slightly outer left breast -tissue sampling recommended. 3. At least 2 left axillary lymph nodes with borderline cortical thickness.   06/06/2017 Initial Biopsy   Diagnosis 1. Breast, left, needle core biopsy, 5:30 o'clock - INVASIVE DUCTAL CARCINOMA, G3 2. Lymph node, needle/core biopsy, left axillary - NO CARCINOMA IDENTIFIED IN ONE LYMPH NODE (0/1)   06/06/2017 Initial Diagnosis   Malignant neoplasm of lower-outer quadrant of left breast of female, estrogen receptor negative ([place])   06/06/2017 Receptors her2   Estrogen Receptor: 0%, NEGATIVE Progesterone Receptor: 0%, NEGATIVE Proliferation Marker Ki67: 70%    06/26/2017 Surgery   LEFT BREAST LUMPECTOMY WITH RADIOACTIVE SEED AND SENTINEL LYMPH NODE BIOPSY ERAS  PATHWAY AND INSERTION PORT-A-CATH By Dr. Barry Dienes on 06/26/17    06/26/2017 Pathology Results   Diagnosis 06/26/17  1. Breast, lumpectomy, Left - INVASIVE DUCTAL CARCINOMA, GRADE III/III, SPANNING 1.2 CM. - THE SURGICAL RESECTION MARGINS ARE NEGATIVE FOR CARCINOMA. - SEE ONCOLOGY TABLE BELOW. 2. Lymph node, sentinel, biopsy, Left axillary #1 - THERE IS NO EVIDENCE OF CARCINOMA IN 1 OF 1 LYMPH NODE (0/1). 3. Lymph node, sentinel, biopsy, Left axillary #2 - THERE IS NO EVIDENCE OF CARCINOMA IN 1 OF 1 LYMPH NODE (0/1). 4. Lymph node, sentinel, biopsy, Left axillary #3 - THERE IS NO EVIDENCE OF CARCINOMA IN 1 OF 1 LYMPH NODE (0/1).    07/25/2017 - 09/29/2017 Chemotherapy   Adjuvant cytoxan and docetaxel (TC) every 3 weekd for 4 cycles     11/19/2017 - 12/17/2017 Radiation Therapy   Adjuvant breast radiation Left breast treated to 42.5 Gy with 17 fx of 2.5 Gy followed by a boost of 7.5 Gy with 3 fx of 2.5 Gy     02/2019 Procedure   She had PAC removal in 02/2019.       CURRENT THERAPY:  Pending Radiation and Chemo   INTERVAL HISTORY: *** Kristin Ward is here for a follow up. She presents to the clinic alone.    REVIEW OF SYSTEMS:  *** Constitutional: Denies fevers, chills or abnormal weight loss Eyes: Denies blurriness of vision Ears, nose, mouth, throat, and face: Denies mucositis or sore throat Respiratory: Denies cough, dyspnea or wheezes Cardiovascular: Denies palpitation, chest discomfort or lower extremity swelling Gastrointestinal:  Denies nausea, heartburn or change in bowel habits Skin:  Denies abnormal skin rashes Lymphatics: Denies new lymphadenopathy or easy bruising Neurological:Denies numbness, tingling or new weaknesses Behavioral/Psych: Mood is stable, no new changes  All other systems were reviewed with the patient and are negative.  MEDICAL  HISTORY:  Past Medical History:  Diagnosis Date  . Abnormal x-ray of lungs with single pulmonary nodule 03/15/2016   per records from [place]   . Anemia   . Asthma   . Cholelithiasis 05/19/2017   On CT  . Coronary artery calcification seen on CAT scan 05/19/2017  . Diabetes mellitus without complication ([place])    type 2  . Dilated idiopathic cardiomyopathy ([place]) 12/2015   EF 15-20%. Diagnosed in [facility]  . Headache    NONE RECENT  . Hypertension   . Lymph nodes enlarged 07/25/2020  . Malignant neoplasm of lower-outer quadrant of left breast of female, estrogen receptor negative ([place]) 06/11/2017   left breast  . Mixed hyperlipidemia 06/17/2018  . partially blind   . Personal history of chemotherapy 2018-2019  . Personal history of radiation therapy 2019  . Retinitis pigmentosa   . Retroperitoneal lymphadenopathy 07/25/2020  . Uveitis     SURGICAL HISTORY: Past Surgical History:  Procedure Laterality Date  . brain cyst removed  2007   to help with headaches per patient , aspirated   . BREAST BIOPSY Left 06/06/2017   x2  . BREAST CYST ASPIRATION Left 06/06/2017  . BREAST LUMPECTOMY Left 06/26/2017  . BREAST LUMPECTOMY WITH RADIOACTIVE SEED AND SENTINEL LYMPH NODE BIOPSY Left 06/26/2017   Procedure: BREAST LUMPECTOMY WITH RADIOACTIVE SEED AND SENTINEL LYMPH NODE BIOPSY ERAS  PATHWAY;  Surgeon: [name], MD;  Location: [place];  Service: General;  Laterality: Left;  pec block  . FINE NEEDLE ASPIRATION Left 02/23/2019   Procedure: FINE NEEDLE ASPIRATION;  Surgeon: [name], MD;  Location: [place];  Service: General;  Laterality: Left;  Asiration of left breast seroma   . NASAL TURBINATE REDUCTION    . PORT-A-CATH REMOVAL Right 02/23/2019   Procedure: REMOVAL PORT-A-CATH;  Surgeon: [name], MD;  Location: [place];  Service: General;  Laterality: Right;  . PORTACATH PLACEMENT N/A 06/26/2017   Procedure:  INSERTION PORT-A-CATH;  Surgeon: [name], MD;  Location: [place];  Service: General;  Laterality: N/A;  . TRANSTHORACIC ECHOCARDIOGRAM  12/2015   A) [facility] May 2017: Mild concentric LVH. Global hypokinesis. GR daily. EF 18% severe LA dilation. Mitral annular dilatation with papillary muscle dysfunction and moderate MR. Dilated IVC consistent with elevated RAP.  Marland Kitchen TRANSTHORACIC ECHOCARDIOGRAM  05/2017; 08/2017   a) Mild concentric LVH.  EF 45 to 50% with diffuse hypokinesis.  GR 1 DD.  Mild aortic root dilation.;; b)  Mild LVH EF 45 to 50%.  Diffuse HK.  No significant valve disease.  No significant change.    I have reviewed the social history and family history with the patient and they are unchanged from previous note.  ALLERGIES:  is allergic to latex, tylenol with codeine #3 [acetaminophen-codeine], and penicillins.  MEDICATIONS:  Current Outpatient Medications  Medication Sig Dispense Refill  . aspirin 81 MG chewable tablet Chew 81 mg by mouth daily.    . carvedilol (COREG) 12.5 MG tablet Take 1 tablet (12.5 mg total) by mouth 2 (two) times daily with a meal. 180 tablet 1  . Cholecalciferol (D3-1000 PO) Take by mouth.    . diclofenac (VOLTAREN) 75 MG EC tablet Take 1 tablet (75 mg total) by mouth 2 (  two) times daily. 60 tablet 1  . ergocalciferol (VITAMIN D2) 1.25 MG (50000 UT) capsule Take 1 capsule (50,000 Units total) by mouth once a week. 12 capsule 0  . furosemide (LASIX) 20 MG tablet Take 2 tablets (40 mg total) by mouth as needed. FOR WEIGHT GAIN  3 TO 4 LBS. (Patient not taking: Reported on 08/30/2020) 90 tablet 3  . glucose blood (ACCU-CHEK GUIDE) test strip Use as instructed to test blood sugar 2 times a day 100 strip 2  . ibuprofen (ADVIL) 800 MG tablet Take 1 tablet (800 mg total) by mouth every 8 (eight) hours as needed. 30 tablet 2  . linaclotide (LINZESS) 72 MCG capsule Take 1 capsule (72 mcg total) by mouth daily before breakfast. 30 capsule 1  . morphine  (MS CONTIN) 15 MG 12 hr tablet Take 1 tablet (15 mg total) by mouth every 12 (twelve) hours. 30 tablet 0  . Multiple Vitamins-Minerals (WOMENS MULTI PO) Take by mouth.  (Patient not taking: Reported on 08/30/2020)    . ondansetron (ZOFRAN) 8 MG tablet Take 1 tablet (8 mg total) by mouth every 8 (eight) hours as needed for nausea or vomiting. 20 tablet 0  . oxyCODONE (OXY IR/ROXICODONE) 5 MG immediate release tablet Take 1 tablet (5 mg total) by mouth every 6 (six) hours as needed for severe pain. 60 tablet 0  . oxyCODONE-acetaminophen (PERCOCET) 5-325 MG tablet Take as needed twice a day for severe pain, or before scan 20 tablet 0  . Saccharomyces boulardii (PROBIOTIC) 250 MG CAPS Take by mouth.    . sacubitril-valsartan (ENTRESTO) 97-103 MG Take 1 tablet by mouth 2 (two) times daily. (Patient not taking: Reported on 08/30/2020) 180 tablet 3  . Semaglutide (RYBELSUS) 3 MG TABS Take 3 mg by mouth daily with breakfast. (Patient not taking: Reported on 08/30/2020) 30 tablet 3  . spironolactone (ALDACTONE) 25 MG tablet Take 1 tablet (25 mg total) by mouth daily. 90 tablet 3  . traMADol (ULTRAM) 50 MG tablet Take 1-2 tabs po bid prn pain 30 tablet 0  . vitamin B-12 (CYANOCOBALAMIN) 500 MCG tablet Take 500 mcg by mouth daily. (Patient not taking: Reported on 08/30/2020)    . vitamin C (ASCORBIC ACID) 500 MG tablet Take 1,000 mg 2 (two) times daily by mouth.     No current facility-administered medications for this visit.    PHYSICAL EXAMINATION: ECOG PERFORMANCE STATUS: {CHL ONC ECOG PS:[id number]}  There were no vitals filed for this visit. There were no vitals filed for this visit. *** GENERAL:alert, no distress and comfortable SKIN: skin color, texture, turgor are normal, no rashes or significant lesions EYES: normal, Conjunctiva are pink and non-injected, sclera clear {OROPHARYNX:no exudate, no erythema and lips, buccal mucosa, and tongue normal}  NECK: supple, thyroid normal size, non-tender,  without nodularity LYMPH:  no palpable lymphadenopathy in the cervical, axillary {or inguinal} LUNGS: clear to auscultation and percussion with normal breathing effort HEART: regular rate & rhythm and no murmurs and no lower extremity edema ABDOMEN:abdomen soft, non-tender and normal bowel sounds Musculoskeletal:no cyanosis of digits and no clubbing  NEURO: alert & oriented x 3 with fluent speech, no focal motor/sensory deficits  LABORATORY DATA:  I have reviewed the data as listed CBC Latest Ref Rng & Units 08/16/2020 06/23/2020 02/16/2020  WBC 4.0 - 10.5 K/uL 7.1 6.7 6.6  Hemoglobin 12.0 - 15.0 g/dL 10.6(L) 12.6 11.6(L)  Hematocrit 36.0 - 46.0 % 32.7(L) 37.9 36.1  Platelets 150 - 400 K/uL 401(H) 364.0 337  CMP Latest Ref Rng & Units 08/16/2020 06/23/2020 02/16/2020  Glucose 70 - 99 mg/dL 105(H) 100(H) 117(H)  BUN 8 - 23 mg/dL '16 12 18  ' Creatinine 0.44 - 1.00 mg/dL 0.83 0.79 1.01(H)  Sodium 135 - 145 mmol/L 142 141 143  Potassium 3.5 - 5.1 mmol/L 4.0 4.5 4.2  Chloride 98 - 111 mmol/L 108 107 110  CO2 22 - 32 mmol/L '28 28 24  ' Calcium 8.9 - 10.3 mg/dL 9.6 9.6 9.5  Total Protein 6.5 - 8.1 g/dL 7.4 - 7.9  Total Bilirubin 0.3 - 1.2 mg/dL 0.4 - 0.5  Alkaline Phos 38 - 126 U/L 87 - 112  AST 15 - 41 U/L 22 - 27  ALT 0 - 44 U/L 18 - 22      RADIOGRAPHIC STUDIES: I have personally reviewed the radiological images as listed and agreed with the findings in the report. No results found.   ASSESSMENT & PLAN:  Lesley Atkin is a 65 y.o. female with    1.Intractable right hip pain -She notes she had recurrent right hip pain since 2020. This now radiates down her right hip and buttock pain. This has caused right leg Weakness -her right hip pain is caused bythe large infiltrative mass around the right iliac wing, Secondary to metastatic disease.  -Her pain is much better controlled now, tolerable. On MS Contin 61m q12hours and Oxycodone  -She plans to proceed with palliative  radiation to right hip soon.    2. Left breast invasive ductal carcinoma, stage IB, p(T1cN0M0), Triple negative, Grade 3, in 2018, metastatic disease in 08/2020 -She was diagnosed in 05/2017.She has high risk for recurrence due to triple negative disease. She is s/p left breast lumpectomy, adjuvant TC and Radiation. -Genetictesting was negative for pathogenetic mutations. -Due to recent right gluteal pain, she underwent biopsy on 09/02/20 which showed metastatic carcinoma, consistent with breast primary. ER 20% positive, ER and HER2 negative.  *** -If this is triple negative disease, and PD-L1 positive, I recommend systemic chemotherapy with weekly Taxol and Keytruda.  I discussed the benefit and side effects with her briefly today.  She is interested -will finalize her treatment plan next week when molecular testing results is back  3. HTN, DM, COPD, retinitis pigmentosa with limited vision, Chronic combined systolic and diastolic heart failure, EF 45-50% -Continue to follow-up with PCP Dr HRaenette Roverand cardiologistDr HEllyn Hack -She is legally blind  4. Anemia -She developed anemia during adjuvant chemotherapy, which resolved after she completed chemo -She had recurrent mild anemia with Hgb 11.7 in 01/2019. Labs on 04/02/2019 showed normal iron, ferritin, folate, and MMA -Remains mild and intermittent.    Plan ***     No problem-specific Assessment & Plan notes found for this encounter.   No orders of the defined types were placed in this encounter.  All questions were answered. The patient knows to call the clinic with any problems, questions or concerns. No barriers to learning was detected. The total time spent in the appointment was {CHL ONC TIME VISIT - SVBTYO:[id number]}     AJoslyn Devon1/19/2022   IOneal Deputy am acting as scribe for YTruitt Merle MD.   {Add scribe attestation statement}

## 2020-08-31 ENCOUNTER — Encounter: Payer: Self-pay | Admitting: General Practice

## 2020-08-31 ENCOUNTER — Ambulatory Visit: Payer: Medicare Other

## 2020-08-31 ENCOUNTER — Ambulatory Visit: Payer: Medicare Other | Admitting: Radiation Oncology

## 2020-08-31 ENCOUNTER — Encounter
Payer: Medicare Other | Attending: Physical Medicine and Rehabilitation | Admitting: Physical Medicine and Rehabilitation

## 2020-08-31 NOTE — Progress Notes (Signed)
[place] Psychosocial Distress Screening Clinical Social Work  Clinical Social Work was referred by distress screening protocol.  The patient scored a 5 on the Psychosocial Distress Thermometer which indicates moderate distress. Clinical Social Worker contacted patient by phone to assess for distress and other psychosocial needs. Called patient, unable to reach, left generic VM as VM was not personally identified.    ONCBCN DISTRESS SCREENING 08/30/2020  Screening Type   Distress experienced in past week (1-10) 5  Practical problem type Food;Insurance  Physical Problem type   Physician notified of physical symptoms     Clinical Social Worker follow up needed: Yes.    If yes, follow up plan:  Call again, left VM for patient.   Beverely Pace, [place], LCSW Clinical Social Worker Phone:  [phone]

## 2020-09-01 ENCOUNTER — Encounter: Payer: Self-pay | Admitting: General Practice

## 2020-09-01 ENCOUNTER — Inpatient Hospital Stay: Payer: Medicare Other | Admitting: Hematology

## 2020-09-01 ENCOUNTER — Ambulatory Visit: Payer: Medicare Other | Admitting: Radiation Oncology

## 2020-09-01 ENCOUNTER — Telehealth: Payer: Self-pay | Admitting: Cardiology

## 2020-09-01 DIAGNOSIS — C7951 Secondary malignant neoplasm of bone: Secondary | ICD-10-CM | POA: Diagnosis not present

## 2020-09-01 DIAGNOSIS — C50512 Malignant neoplasm of lower-outer quadrant of left female breast: Secondary | ICD-10-CM | POA: Diagnosis not present

## 2020-09-01 DIAGNOSIS — G893 Neoplasm related pain (acute) (chronic): Secondary | ICD-10-CM | POA: Diagnosis not present

## 2020-09-01 DIAGNOSIS — Z510 Encounter for antineoplastic radiation therapy: Secondary | ICD-10-CM | POA: Diagnosis not present

## 2020-09-01 NOTE — Telephone Encounter (Signed)
Left message to call back  

## 2020-09-01 NOTE — Progress Notes (Signed)
[place] [facility]  Initial Assessment   Kristin Ward is a 65 y.o. year old female assessed by phone. Clinical Social Work was referred by Distress Screen for assessment of psychosocial needs.   SDOH (Social Determinants of Health) assessments performed: Yes   Distress Screen completed: Yes ONCBCN DISTRESS SCREENING 08/30/2020  Screening Type -  Distress experienced in past week (1-10) 5  Practical problem type Food;Insurance  Physical Problem type -  Physician notified of physical symptoms -      Family/Social Information:  . Housing Arrangement: patient lives with son and daughter. . Family members/support persons in your life? Son and daughter are her major supports, no other supports in community. . Transportation concerns: No . Employment: Retired. Is legally blind at this time, no longer works.  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 .  Income source: on disability . Financial concerns: Yes, due to illness and/or loss of work during treatment, money is tight o Type of concern: Medical bills . Food access concerns: Eating has been "difficult."  "Nothing stays down."  Too tired to prepare food, children help w this.  Wants Meals on Wheels.   States she talked to [place], [place] re this issue, was prescribed anti nausea medications.  She cannot find the medications at this point.   . Religious or spiritual practice: Has faith community that can be supportive but has been difficult w COVID.  Marland Kitchen Medication Concerns: has trouble keeping track of medications.  "Cannot find them, they are not staying down, I took my heart medication but threw it right back up."  Wonders if her body is utilizing the medication.   . Services Currently in place:  None  Coping/ Adjustment to diagnosis: . Patient understands treatment plan and what happens next? Newly diagnosed w "breast cancer in my hip", will be starting radiation treatment next week.  Previously  diagnosed w breast cancer and received treatment in past.  . Concerns about diagnosis and/or treatment: How I will pay for the services I need and "I dont know what stage I am, what to expect" "I might be just nervous, just not feeling well." . Patient reported stressors: Finances, Anxiety and Adjusting to my illness "I don't do anything to manage my anxiety, that's probably why I feel like I feel, Its me going through nervousness."  "Anticipating what is next"  "Your mind plays tricks on you" . Hopes and priorities:  Getting started in treatment, managing nausea . Patient enjoys time with family/ friends and liked [employer] activites . Current coping skills/ strengths: Ability for insight, Average or above average intelligence, Capable of independent living, General fund of knowledge and Supportive family/friends    SUMMARY: Current SDOH Barriers:  . nausea, fatigue, limitations on ability to prepare meals, lives w adult children who are supportive but not always available  Interventions: . Discussed common feeling and emotions when being diagnosed with cancer, and the importance of support during treatment . Informed patient of the support team roles and support services at [facility] . Provided CSW contact information and encouraged patient to call with any questions or concerns . Referred patient to [place] and [employer] for financial support. Provided packet of information on Support Center and [employer] activities.  Advised her that we can refer her for an [place] or to the [facility] counseling intern if she would like.  She would like to participate in upcoming Living with Cancer support  group - asked her to let Admin Asst know so she can attend next meeting.     Follow Up Plan: Patient will contact CSW with any support or resource needs Patient verbalizes understanding of plan: Yes    Beverely Pace , [place], LCSW Clinical Social  Worker Phone:  [phone]

## 2020-09-01 NOTE — Telephone Encounter (Signed)
Patient states she has cancer and when she takes her medications she is regurgitating them. She is not sure if her body is using the medications. Please advise.

## 2020-09-04 ENCOUNTER — Telehealth: Payer: Self-pay | Admitting: Nurse Practitioner

## 2020-09-04 ENCOUNTER — Ambulatory Visit
Admission: RE | Admit: 2020-09-04 | Discharge: 2020-09-04 | Disposition: A | Discharge status: Home / Self Care | Payer: Medicare Other | Source: Ambulatory Visit | Attending: Radiation Oncology | Admitting: Radiation Oncology

## 2020-09-04 DIAGNOSIS — G893 Neoplasm related pain (acute) (chronic): Secondary | ICD-10-CM | POA: Diagnosis not present

## 2020-09-04 DIAGNOSIS — Z510 Encounter for antineoplastic radiation therapy: Secondary | ICD-10-CM | POA: Diagnosis not present

## 2020-09-04 DIAGNOSIS — C7951 Secondary malignant neoplasm of bone: Secondary | ICD-10-CM | POA: Diagnosis not present

## 2020-09-04 DIAGNOSIS — C50512 Malignant neoplasm of lower-outer quadrant of left female breast: Secondary | ICD-10-CM | POA: Diagnosis not present

## 2020-09-04 NOTE — Telephone Encounter (Signed)
I called [name] about her appointment on 09/05/20. ER/PR/HER2 is back but PD-L1 is pending. She starts RT tomorrow, I will see her for pain/SMC visit. She understands that without PD-L1 and PET scan we won't be able to have full treatment/prognosis discussion. Will plan to schedule that 09/14/20 after PET. She agrees. In the mean time I recommend to start MS contin BID and continue oxy IR PRN. She understands.   Cira Rue, NP

## 2020-09-05 ENCOUNTER — Inpatient Hospital Stay (HOSPITAL_BASED_OUTPATIENT_CLINIC_OR_DEPARTMENT_OTHER): Payer: Medicare Other | Admitting: Nurse Practitioner

## 2020-09-05 ENCOUNTER — Encounter: Payer: Self-pay | Admitting: Nurse Practitioner

## 2020-09-05 ENCOUNTER — Ambulatory Visit
Admission: RE | Admit: 2020-09-05 | Discharge: 2020-09-05 | Disposition: A | Discharge status: Home / Self Care | Payer: Medicare Other | Source: Ambulatory Visit | Attending: Radiation Oncology | Admitting: Radiation Oncology

## 2020-09-05 ENCOUNTER — Other Ambulatory Visit: Payer: Self-pay

## 2020-09-05 VITALS — BP 105/71 | HR 73 | Temp 97.4°F | Resp 16 | Ht 67.0 in | Wt 212.1 lb

## 2020-09-05 DIAGNOSIS — G893 Neoplasm related pain (acute) (chronic): Secondary | ICD-10-CM | POA: Diagnosis not present

## 2020-09-05 DIAGNOSIS — M25551 Pain in right hip: Secondary | ICD-10-CM | POA: Diagnosis not present

## 2020-09-05 DIAGNOSIS — Z171 Estrogen receptor negative status [ER-]: Secondary | ICD-10-CM | POA: Diagnosis not present

## 2020-09-05 DIAGNOSIS — C50512 Malignant neoplasm of lower-outer quadrant of left female breast: Secondary | ICD-10-CM | POA: Diagnosis not present

## 2020-09-05 DIAGNOSIS — C7951 Secondary malignant neoplasm of bone: Secondary | ICD-10-CM | POA: Diagnosis not present

## 2020-09-05 DIAGNOSIS — Z510 Encounter for antineoplastic radiation therapy: Secondary | ICD-10-CM | POA: Diagnosis not present

## 2020-09-05 MED ORDER — OXYCODONE HCL ER 10 MG PO T12A
10.0000 mg | EXTENDED_RELEASE_TABLET | Freq: Two times a day (BID) | ORAL | 0 refills | Status: DC
Start: 2020-09-05 — End: 2020-09-15

## 2020-09-05 NOTE — Progress Notes (Signed)
Medication Morphine tablets collected during visit today this nurse counts 22 tablets verified by second count of second nurse initials M.Y tablets disposed

## 2020-09-05 NOTE — Progress Notes (Signed)
Home prescription morphine brought in by patient and discarded with Bevely Palmer, LPN. 22 tablets noted and discarded in stericycle at [facility] in infusion medication room

## 2020-09-05 NOTE — Progress Notes (Signed)
Kristin Ward   [phone] [phone]   Clinic Follow up Note   Patient Care Team: Kristin Rm, NP-C as PCP - General (Family Medicine) Kristin Man, Ward as PCP - Cardiology (Cardiology) Kristin Ward as Consulting Physician (Hematology) Kristin Klein, Ward as Consulting Physician (General Surgery) 09/05/2020  CHIEF COMPLAINT: pain management visit   SUMMARY OF ONCOLOGIC HISTORY: Oncology History Overview Note  Cancer Staging Malignant neoplasm of lower-outer quadrant of left breast of female, estrogen receptor negative ([place]) Staging form: Breast, AJCC 8th Edition - Clinical stage from 06/06/2017: Stage IB (cT1c, cN0, cM0, G3, ER: Negative, PR: Negative, HER2: Negative) - Signed by Kristin Ward on 06/15/2017 - Pathologic stage from 06/26/2017: Stage IB (pT1c, pN0, cM0, G3, ER: Negative, PR: Negative, HER2: Negative) - Signed by Kristin Ward on 07/10/2017     Malignant neoplasm of lower-outer quadrant of left breast of female, estrogen receptor negative ([place])  05/30/2017 Mammogram   Diagnostic mammo and US IMPRESSION: 1. Highly suspicious 1.4 cm mass in the slightly lower slightly outer left breast -tissue sampling recommended. 2. Indeterminate 0.5 mm mass in the slightly lower slightly outer left breast -tissue sampling recommended. 3. At least 2 left axillary lymph nodes with borderline cortical thickness.   06/06/2017 Initial Biopsy   Diagnosis 1. Breast, left, needle core biopsy, 5:30 o'clock - INVASIVE DUCTAL CARCINOMA, G3 2. Lymph node, needle/core biopsy, left axillary - NO CARCINOMA IDENTIFIED IN ONE LYMPH NODE (0/1)   06/06/2017 Initial Diagnosis   Malignant neoplasm of lower-outer quadrant of left breast of female, estrogen receptor negative ([place])   06/06/2017 Receptors her2   Estrogen Receptor: 0%, NEGATIVE Progesterone Receptor: 0%, NEGATIVE Proliferation Marker Ki67: 70%   06/26/2017 Surgery   LEFT BREAST  LUMPECTOMY WITH RADIOACTIVE SEED AND SENTINEL LYMPH NODE BIOPSY ERAS  PATHWAY AND INSERTION PORT-A-CATH By Kristin Ward on 06/26/17    06/26/2017 Pathology Results   Diagnosis 06/26/17  1. Breast, lumpectomy, Left - INVASIVE DUCTAL CARCINOMA, GRADE III/III, SPANNING 1.2 CM. - THE SURGICAL RESECTION MARGINS ARE NEGATIVE FOR CARCINOMA. - SEE ONCOLOGY TABLE BELOW. 2. Lymph node, sentinel, biopsy, Left axillary #1 - THERE IS NO EVIDENCE OF CARCINOMA IN 1 OF 1 LYMPH NODE (0/1). 3. Lymph node, sentinel, biopsy, Left axillary #2 - THERE IS NO EVIDENCE OF CARCINOMA IN 1 OF 1 LYMPH NODE (0/1). 4. Lymph node, sentinel, biopsy, Left axillary #3 - THERE IS NO EVIDENCE OF CARCINOMA IN 1 OF 1 LYMPH NODE (0/1).    07/25/2017 - 09/29/2017 Chemotherapy   Adjuvant cytoxan and docetaxel (TC) every 3 weekd for 4 cycles     11/19/2017 - 12/17/2017 Radiation Therapy   Adjuvant breast radiation Left breast treated to 42.5 Gy with 17 fx of 2.5 Gy followed by a boost of 7.5 Gy with 3 fx of 2.5 Gy     02/2019 Procedure   She had PAC removal in 02/2019.    07/23/2020 Imaging   MRI Lumbar Spine 07/23/20 IMPRESSION: 1. No significant disc herniation, spinal canal or neural foraminal stenosis at any level. 2. Moderate facet degenerative changes at L3-4, L4-5 and L5-S1. 3. Multiple enlarged retroperitoneal and right iliac lymph nodes. Recommend correlation with CT of the abdomen and pelvis with contrast.   08/15/2020 Imaging   CT AP 08/15/20  IMPRESSION: 1. Right iliac and periaortic adenopathy is noted concerning for metastatic disease or lymphoma. Also noted is abnormal soft tissue mass anterior and posterior to the right iliac wing concerning for malignancy  or metastatic disease. MRI is recommended for further evaluation. 2. Irregular lucency with sclerotic margins is seen involving the right iliac crest concerning for possible lytic lesion. MRI may be performed for further evaluation. 3. Enlarged  fibroid uterus. 4. Small gallstone. 5. Aortic atherosclerosis.   08/22/2020 Pathology Results   FINAL MICROSCOPIC DIAGNOSIS: 08/22/20  A. NEEDLE CORE, RIGHT GLUTEUS MINIMUMUS, BIOPSY:  -  Metastatic carcinoma  -  See comment   COMMENT:   By immunohistochemistry, the neoplastic cells are positive for  cytokeratin-7 and have weak patchy positivity for GATA3 but are negative  for cytokeratin-20 and GCDFP.  The combined morphology and  immunophenotype, support metastasis from the patient's known breast  primary carcinoma.  Kristin Ward was notified of these results on 08/24/2020.  Prognostic markers (ER, PR, Her2, PDL-1) are pending and will be  reported in an addendum   ADDENDUM:   PROGNOSTIC INDICATOR RESULTS:   Immunohistochemical and morphometric analysis performed manually   The tumor cells are NEGATIVE for Her2 (0).   Estrogen Receptor:       POSITIVE, 20%, WEAK STAINING  Progesterone Receptor:   NEGATIVE      CURRENT THERAPY: palliative radiation to right iliac 09/04/20 - 09/22/20, Dr. Lisbeth Ward  INTERVAL HISTORY:  Kristin Ward returns for pain management/[facility] visit with her Son. She is in a wheelchair. She reportedly only took MS contin and oxycodon from 1/11 - 1/14 then stopped. Pill count supports this. She then took NSAIDs for pain until I called her yesterday and recommended to restart morphine and oxy for worsening pain. Pain level is 10 before meds and 0 after. She was better able to tolerate radiation today. She had an episode of n/v last week that resolved with zofran, able to eat and drink better this week. However she did vomit today after restarting morphine. She is otherwise doing OK. Denies abdominal pain, constipation/diarrhea, juandice, fever, chills, cough, chest pain, dyspnea, leg edema, bowel/bladder incontinence or other new issues.    MEDICAL HISTORY:  Past Medical History:  Diagnosis Date  . Abnormal x-ray of lungs with single pulmonary nodule 03/15/2016   per  records from [place]   . Anemia   . Asthma   . Cholelithiasis 05/19/2017   On CT  . Coronary artery calcification seen on CAT scan 05/19/2017  . Diabetes mellitus without complication ([place])    type 2  . Dilated idiopathic cardiomyopathy ([place]) 12/2015   EF 15-20%. Diagnosed in [facility]  . Headache    NONE RECENT  . Hypertension   . Lymph nodes enlarged 07/25/2020  . Malignant neoplasm of lower-outer quadrant of left breast of female, estrogen receptor negative ([place]) 06/11/2017   left breast  . Mixed hyperlipidemia 06/17/2018  . partially blind   . Personal history of chemotherapy 2018-2019  . Personal history of radiation therapy 2019  . Retinitis pigmentosa   . Retroperitoneal lymphadenopathy 07/25/2020  . Uveitis     SURGICAL HISTORY: Past Surgical History:  Procedure Laterality Date  . brain cyst removed  2007   to help with headaches per patient , aspirated   . BREAST BIOPSY Left 06/06/2017   x2  . BREAST CYST ASPIRATION Left 06/06/2017  . BREAST LUMPECTOMY Left 06/26/2017  . BREAST LUMPECTOMY WITH RADIOACTIVE SEED AND SENTINEL LYMPH NODE BIOPSY Left 06/26/2017   Procedure: BREAST LUMPECTOMY WITH RADIOACTIVE SEED AND SENTINEL LYMPH NODE BIOPSY ERAS  PATHWAY;  Surgeon: Kristin Klein, Ward;  Location: [place];  Service: General;  Laterality: Left;  pec block  .  FINE NEEDLE ASPIRATION Left 02/23/2019   Procedure: FINE NEEDLE ASPIRATION;  Surgeon: Kristin Klein, Ward;  Location: [place];  Service: General;  Laterality: Left;  Asiration of left breast seroma   . NASAL TURBINATE REDUCTION    . PORT-A-CATH REMOVAL Right 02/23/2019   Procedure: REMOVAL PORT-A-CATH;  Surgeon: Kristin Klein, Ward;  Location: [place];  Service: General;  Laterality: Right;  . PORTACATH PLACEMENT N/A 06/26/2017   Procedure: INSERTION PORT-A-CATH;  Surgeon: Kristin Klein, Ward;  Location: [place];  Service: General;  Laterality: N/A;  . TRANSTHORACIC  ECHOCARDIOGRAM  12/2015   A) [facility] May 2017: Mild concentric LVH. Global hypokinesis. GR daily. EF 18% severe LA dilation. Mitral annular dilatation with papillary muscle dysfunction and moderate MR. Dilated IVC consistent with elevated RAP.  [facility] TRANSTHORACIC ECHOCARDIOGRAM  05/2017; 08/2017   a) Mild concentric LVH.  EF 45 to 50% with diffuse hypokinesis.  GR 1 DD.  Mild aortic root dilation.;; b)  Mild LVH EF 45 to 50%.  Diffuse HK.  No significant valve disease.  No significant change.    I have reviewed the social history and family history with the patient and they are unchanged from previous note.  ALLERGIES:  is allergic to latex, tylenol with codeine #3 [acetaminophen-codeine], and penicillins.  MEDICATIONS:  Current Outpatient Medications  Medication Sig Dispense Refill  . oxyCODONE (OXYCONTIN) 10 mg 12 hr tablet Take 1 tablet (10 mg total) by mouth every 12 (twelve) hours. 30 tablet 0  . aspirin 81 MG chewable tablet Chew 81 mg by mouth daily.    . carvedilol (COREG) 12.5 MG tablet Take 1 tablet (12.5 mg total) by mouth 2 (two) times daily with a meal. 180 tablet 1  . Cholecalciferol (D3-1000 PO) Take by mouth.    . diclofenac (VOLTAREN) 75 MG EC tablet Take 1 tablet (75 mg total) by mouth 2 (two) times daily. 60 tablet 1  . furosemide (LASIX) 20 MG tablet Take 2 tablets (40 mg total) by mouth as needed. FOR WEIGHT GAIN  3 TO 4 LBS. (Patient not taking: Reported on 08/30/2020) 90 tablet 3  . glucose blood (ACCU-CHEK GUIDE) test strip Use as instructed to test blood sugar 2 times a day 100 strip 2  . ibuprofen (ADVIL) 800 MG tablet Take 1 tablet (800 mg total) by mouth every 8 (eight) hours as needed. 30 tablet 2  . linaclotide (LINZESS) 72 MCG capsule Take 1 capsule (72 mcg total) by mouth daily before breakfast. 30 capsule 1  . morphine (MS CONTIN) 15 MG 12 hr tablet Take 1 tablet (15 mg total) by mouth every 12 (twelve) hours. 30 tablet 0  . Multiple Vitamins-Minerals  (WOMENS MULTI PO) Take by mouth.  (Patient not taking: Reported on 08/30/2020)    . ondansetron (ZOFRAN) 8 MG tablet Take 1 tablet (8 mg total) by mouth every 8 (eight) hours as needed for nausea or vomiting. 20 tablet 0  . oxyCODONE (OXY IR/ROXICODONE) 5 MG immediate release tablet Take 1 tablet (5 mg total) by mouth every 6 (six) hours as needed for severe pain. 60 tablet 0  . oxyCODONE-acetaminophen (PERCOCET) 5-325 MG tablet Take as needed twice a day for severe pain, or before scan 20 tablet 0  . Saccharomyces boulardii (PROBIOTIC) 250 MG CAPS Take by mouth.    . sacubitril-valsartan (ENTRESTO) 97-103 MG Take 1 tablet by mouth 2 (two) times daily. (Patient not taking: Reported on 08/30/2020) 180 tablet 3  . Semaglutide (RYBELSUS) 3  MG TABS Take 3 mg by mouth daily with breakfast. (Patient not taking: Reported on 08/30/2020) 30 tablet 3  . spironolactone (ALDACTONE) 25 MG tablet Take 1 tablet (25 mg total) by mouth daily. 90 tablet 3  . vitamin B-12 (CYANOCOBALAMIN) 500 MCG tablet Take 500 mcg by mouth daily. (Patient not taking: Reported on 08/30/2020)    . vitamin C (ASCORBIC ACID) 500 MG tablet Take 1,000 mg 2 (two) times daily by mouth.     No current facility-administered medications for this visit.    PHYSICAL EXAMINATION: ECOG PERFORMANCE STATUS: 2 - Symptomatic, <50% confined to bed  Vitals:   09/05/20 1505  BP: 105/71  Pulse: 73  Resp: 16  Temp: (!) 97.4 F (36.3 C)  SpO2: 97%   Filed Weights   09/05/20 1505  Weight: 212 lb 1.6 oz (96.2 kg)    GENERAL:alert, no distress and comfortable SKIN: no rash  EYES: sclera clear LUNGS: clear, normal breathing effort HEART: regular rate & rhythm, no lower extremity edema ABDOMEN:abdomen soft, non-tender and normal bowel sounds Musculoskeletal: no focal right hip pain  NEURO: alert & oriented x 3 with fluent speech  LABORATORY DATA:  I have reviewed the data as listed CBC Latest Ref Rng & Units 08/16/2020 06/23/2020 02/16/2020   WBC 4.0 - 10.5 K/uL 7.1 6.7 6.6  Hemoglobin 12.0 - 15.0 g/dL 10.6(L) 12.6 11.6(L)  Hematocrit 36.0 - 46.0 % 32.7(L) 37.9 36.1  Platelets 150 - 400 K/uL 401(H) 364.0 337     CMP Latest Ref Rng & Units 08/16/2020 06/23/2020 02/16/2020  Glucose 70 - 99 mg/dL 105(H) 100(H) 117(H)  BUN 8 - 23 mg/dL _0 Creatinine 0.44 - 1.00 mg/dL 0.83 0.79 1.01(H)  Sodium 135 - 145 mmol/L 142 141 143  Potassium 3.5 - 5.1 mmol/L 4.0 4.5 4.2  Chloride 98 - 111 mmol/L 108 107 110  CO2 22 - 32 mmol/L _1 Calcium 8.9 - 10.3 mg/dL 9.6 9.6 9.5  Total Protein 6.5 - 8.1 g/dL 7.4 - 7.9  Total Bilirubin 0.3 - 1.2 mg/dL 0.4 - 0.5  Alkaline Phos 38 - 126 U/L 87 - 112  AST 15 - 41 U/L 22 - 27  ALT 0 - 44 U/L 18 - 22      RADIOGRAPHIC STUDIES: I have personally reviewed the radiological images as listed and agreed with the findings in the report. No results found.   ASSESSMENT & PLAN: Kristin Ward a 65 y.o.femalewith a history of   1. 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 Work up ultimately revealed right iliac and pariaortic adenopathy and mass in the right iliac wing, concerning for malignancy.  -right gluteal mass biopsy 08/22/20 confirmed metastatic breast cancer -staging PET scan 09/13/20  2. Right iliac pain -secondary to right iliac wing mass -undergoing palliative RT 09/04/20 - 09/22/20 per Dr. Lisbeth Ward  -pain regimen initially MS contin 15 mg BID and oxyIR 5 mg q6. Due to n/v MS contin switched to oxycontin 10 mg BID on 09/05/20  3. Anemia -She developed anemia during adjuvant  chemotherapy, then mild and intermittent thereafter  -Ferritin 268 on 08/16/20  4. HTN, HL, DM, COPD, retinitis pigmentosa with limited vision, nonischemic cardiomyopathy, CHF -she is legally blind, children accompany her and she uses [employer] transportation -management per PCP, cardiology    Disposition:  Kristin Ward appears stable. She has started palliative RT to right iliac. We reviewed her pain regimen. Due to n/v from morphine, I changed her long acting agent to oxycontin 10 mg q12, MS contin was discarded. She will continue oxyIR q6 PRN. She will continue zofran PRN. She knows to call if pain regimen is not effective or she has new concerns. I also reviewed the plan with her son today.  I briefly discussed her prognostic panel and molecular studies which shows PD-L1 0% negative and ER weakly positive 20%, PR/HER2 (0) negative. This will likely be treated as functionally triple negative, however Kristin Ward may recommend anti-estrogen with chemo. She is not a candidate for immunotherapy. She will return next week after PET scan for full discussion. Continue palliative RT (plan to complete 09/22/20).   All questions were answered. The patient knows to call the clinic with any problems, questions or concerns. No barriers to learning were detected. Total encounter time was 30 minutes.      Alla Feeling, NP 09/05/20

## 2020-09-06 ENCOUNTER — Other Ambulatory Visit: Payer: Self-pay

## 2020-09-06 ENCOUNTER — Ambulatory Visit
Admission: RE | Admit: 2020-09-06 | Discharge: 2020-09-06 | Disposition: A | Discharge status: Home / Self Care | Payer: Medicare Other | Source: Ambulatory Visit | Attending: Radiation Oncology | Admitting: Radiation Oncology

## 2020-09-06 DIAGNOSIS — C7951 Secondary malignant neoplasm of bone: Secondary | ICD-10-CM | POA: Diagnosis not present

## 2020-09-06 DIAGNOSIS — G893 Neoplasm related pain (acute) (chronic): Secondary | ICD-10-CM | POA: Diagnosis not present

## 2020-09-06 DIAGNOSIS — Z510 Encounter for antineoplastic radiation therapy: Secondary | ICD-10-CM | POA: Diagnosis not present

## 2020-09-06 DIAGNOSIS — C50512 Malignant neoplasm of lower-outer quadrant of left female breast: Secondary | ICD-10-CM | POA: Diagnosis not present

## 2020-09-07 ENCOUNTER — Other Ambulatory Visit: Payer: Self-pay

## 2020-09-07 ENCOUNTER — Ambulatory Visit
Admission: RE | Admit: 2020-09-07 | Discharge: 2020-09-07 | Disposition: A | Discharge status: Home / Self Care | Payer: Medicare Other | Source: Ambulatory Visit | Attending: Radiation Oncology | Admitting: Radiation Oncology

## 2020-09-07 DIAGNOSIS — Z510 Encounter for antineoplastic radiation therapy: Secondary | ICD-10-CM | POA: Diagnosis not present

## 2020-09-07 DIAGNOSIS — C7951 Secondary malignant neoplasm of bone: Secondary | ICD-10-CM | POA: Diagnosis not present

## 2020-09-07 DIAGNOSIS — C50512 Malignant neoplasm of lower-outer quadrant of left female breast: Secondary | ICD-10-CM | POA: Diagnosis not present

## 2020-09-07 DIAGNOSIS — G893 Neoplasm related pain (acute) (chronic): Secondary | ICD-10-CM | POA: Diagnosis not present

## 2020-09-08 ENCOUNTER — Ambulatory Visit
Admission: RE | Admit: 2020-09-08 | Discharge: 2020-09-08 | Disposition: A | Discharge status: Home / Self Care | Payer: Medicare Other | Source: Ambulatory Visit | Attending: Radiation Oncology | Admitting: Radiation Oncology

## 2020-09-08 ENCOUNTER — Other Ambulatory Visit: Payer: Self-pay

## 2020-09-08 DIAGNOSIS — G893 Neoplasm related pain (acute) (chronic): Secondary | ICD-10-CM | POA: Diagnosis not present

## 2020-09-08 DIAGNOSIS — Z510 Encounter for antineoplastic radiation therapy: Secondary | ICD-10-CM | POA: Diagnosis not present

## 2020-09-08 DIAGNOSIS — C7951 Secondary malignant neoplasm of bone: Secondary | ICD-10-CM | POA: Diagnosis not present

## 2020-09-08 DIAGNOSIS — C50512 Malignant neoplasm of lower-outer quadrant of left female breast: Secondary | ICD-10-CM | POA: Diagnosis not present

## 2020-09-08 NOTE — Telephone Encounter (Signed)
Attempted another follow up call to the patient. No answer- I left a detailed message on her voice mail (ok per DPR), that we were checking in to see how she has been doing this week after no call back from her.  I advised if she is ok and does not need [place] for anything, there is no reason to call back. Otherwise, if she still has concerns, please give [place] a call back.  Would close this encounter is no call back from the patient today.

## 2020-09-11 ENCOUNTER — Ambulatory Visit
Admission: RE | Admit: 2020-09-11 | Discharge: 2020-09-11 | Disposition: A | Discharge status: Home / Self Care | Payer: Medicare Other | Source: Ambulatory Visit | Attending: Radiation Oncology | Admitting: Radiation Oncology

## 2020-09-11 ENCOUNTER — Other Ambulatory Visit: Payer: Self-pay

## 2020-09-11 DIAGNOSIS — Z510 Encounter for antineoplastic radiation therapy: Secondary | ICD-10-CM | POA: Diagnosis not present

## 2020-09-11 DIAGNOSIS — C7951 Secondary malignant neoplasm of bone: Secondary | ICD-10-CM | POA: Diagnosis not present

## 2020-09-11 DIAGNOSIS — C50512 Malignant neoplasm of lower-outer quadrant of left female breast: Secondary | ICD-10-CM | POA: Diagnosis not present

## 2020-09-11 DIAGNOSIS — G893 Neoplasm related pain (acute) (chronic): Secondary | ICD-10-CM | POA: Diagnosis not present

## 2020-09-12 ENCOUNTER — Ambulatory Visit
Admission: RE | Admit: 2020-09-12 | Discharge: 2020-09-12 | Disposition: A | Discharge status: Home / Self Care | Payer: Medicare Other | Source: Ambulatory Visit | Attending: Radiation Oncology | Admitting: Radiation Oncology

## 2020-09-12 ENCOUNTER — Other Ambulatory Visit: Payer: Self-pay

## 2020-09-12 DIAGNOSIS — C7951 Secondary malignant neoplasm of bone: Secondary | ICD-10-CM | POA: Diagnosis not present

## 2020-09-12 DIAGNOSIS — C50512 Malignant neoplasm of lower-outer quadrant of left female breast: Secondary | ICD-10-CM | POA: Diagnosis not present

## 2020-09-12 DIAGNOSIS — Z510 Encounter for antineoplastic radiation therapy: Secondary | ICD-10-CM | POA: Insufficient documentation

## 2020-09-12 DIAGNOSIS — G893 Neoplasm related pain (acute) (chronic): Secondary | ICD-10-CM | POA: Diagnosis not present

## 2020-09-13 ENCOUNTER — Other Ambulatory Visit: Payer: Self-pay

## 2020-09-13 ENCOUNTER — Encounter (HOSPITAL_COMMUNITY)
Admission: RE | Admit: 2020-09-13 | Discharge: 2020-09-13 | Disposition: A | Discharge status: Home / Self Care | Payer: Medicare Other | Source: Ambulatory Visit | Attending: Hematology | Admitting: Hematology

## 2020-09-13 ENCOUNTER — Ambulatory Visit
Admission: RE | Admit: 2020-09-13 | Discharge: 2020-09-13 | Disposition: A | Discharge status: Home / Self Care | Payer: Medicare Other | Source: Ambulatory Visit | Attending: Radiation Oncology | Admitting: Radiation Oncology

## 2020-09-13 DIAGNOSIS — C7951 Secondary malignant neoplasm of bone: Secondary | ICD-10-CM | POA: Diagnosis not present

## 2020-09-13 DIAGNOSIS — C50512 Malignant neoplasm of lower-outer quadrant of left female breast: Secondary | ICD-10-CM | POA: Diagnosis not present

## 2020-09-13 DIAGNOSIS — Z510 Encounter for antineoplastic radiation therapy: Secondary | ICD-10-CM | POA: Diagnosis not present

## 2020-09-13 DIAGNOSIS — Z171 Estrogen receptor negative status [ER-]: Secondary | ICD-10-CM | POA: Insufficient documentation

## 2020-09-13 DIAGNOSIS — G893 Neoplasm related pain (acute) (chronic): Secondary | ICD-10-CM | POA: Diagnosis not present

## 2020-09-13 NOTE — Progress Notes (Signed)
[place]   [phone] [phone]   Clinic Follow up Note   Patient Care Team: Girtha Rm, NP-C as PCP - General (Family Medicine) [name], MD as PCP - Cardiology (Cardiology) [name], MD as Consulting Physician (Hematology) [name], MD as Consulting Physician (General Surgery)  Date of Service:  09/15/2020  CHIEF COMPLAINT: F/u of left breast cancer and pain control   SUMMARY OF ONCOLOGIC HISTORY: Oncology History Overview Note  Cancer Staging Malignant neoplasm of lower-outer quadrant of left breast of female, estrogen receptor negative ([place]) Staging form: Breast, AJCC 8th Edition - Clinical stage from 06/06/2017: Stage IB (cT1c, cN0, cM0, G3, ER: Negative, PR: Negative, HER2: Negative) - Signed by [name], MD on 06/15/2017 - Pathologic stage from 06/26/2017: Stage IB (pT1c, pN0, cM0, G3, ER: Negative, PR: Negative, HER2: Negative) - Signed by [name], MD on 07/10/2017     Malignant neoplasm of lower-outer quadrant of left breast of female, estrogen receptor negative ([place])  05/30/2017 Mammogram   Diagnostic mammo and US IMPRESSION: 1. Highly suspicious 1.4 cm mass in the slightly lower slightly outer left breast -tissue sampling recommended. 2. Indeterminate 0.5 mm mass in the slightly lower slightly outer left breast -tissue sampling recommended. 3. At least 2 left axillary lymph nodes with borderline cortical thickness.   06/06/2017 Initial Biopsy   Diagnosis 1. Breast, left, needle core biopsy, 5:30 o'clock - INVASIVE DUCTAL CARCINOMA, G3 2. Lymph node, needle/core biopsy, left axillary - NO CARCINOMA IDENTIFIED IN ONE LYMPH NODE (0/1)   06/06/2017 Initial Diagnosis   Malignant neoplasm of lower-outer quadrant of left breast of female, estrogen receptor negative ([place])   06/06/2017 Receptors her2   Estrogen Receptor: 0%, NEGATIVE Progesterone Receptor: 0%, NEGATIVE Proliferation Marker Ki67: 70%    06/26/2017 Surgery   LEFT BREAST LUMPECTOMY WITH RADIOACTIVE SEED AND SENTINEL LYMPH NODE BIOPSY ERAS  PATHWAY AND INSERTION PORT-A-CATH By Dr. Barry Dienes on 06/26/17    06/26/2017 Pathology Results   Diagnosis 06/26/17  1. Breast, lumpectomy, Left - INVASIVE DUCTAL CARCINOMA, GRADE III/III, SPANNING 1.2 CM. - THE SURGICAL RESECTION MARGINS ARE NEGATIVE FOR CARCINOMA. - SEE ONCOLOGY TABLE BELOW. 2. Lymph node, sentinel, biopsy, Left axillary #1 - THERE IS NO EVIDENCE OF CARCINOMA IN 1 OF 1 LYMPH NODE (0/1). 3. Lymph node, sentinel, biopsy, Left axillary #2 - THERE IS NO EVIDENCE OF CARCINOMA IN 1 OF 1 LYMPH NODE (0/1). 4. Lymph node, sentinel, biopsy, Left axillary #3 - THERE IS NO EVIDENCE OF CARCINOMA IN 1 OF 1 LYMPH NODE (0/1).    07/25/2017 - 09/29/2017 Chemotherapy   Adjuvant cytoxan and docetaxel (TC) every 3 weekd for 4 cycles     11/19/2017 - 12/17/2017 Radiation Therapy   Adjuvant breast radiation Left breast treated to 42.5 Gy with 17 fx of 2.5 Gy followed by a boost of 7.5 Gy with 3 fx of 2.5 Gy     02/2019 Procedure   She had PAC removal in 02/2019.    07/23/2020 Imaging   MRI Lumbar Spine 07/23/20 IMPRESSION: 1. No significant disc herniation, spinal canal or neural foraminal stenosis at any level. 2. Moderate facet degenerative changes at L3-4, L4-5 and L5-S1. 3. Multiple enlarged retroperitoneal and right iliac lymph nodes. Recommend correlation with CT of the abdomen and pelvis with contrast.   08/15/2020 Imaging   CT AP 08/15/20  IMPRESSION: 1. Right iliac and periaortic adenopathy is noted concerning for metastatic disease or lymphoma. Also noted is abnormal soft tissue mass anterior  and posterior to the right iliac wing concerning for malignancy or metastatic disease. MRI is recommended for further evaluation. 2. Irregular lucency with sclerotic margins is seen involving the right iliac crest concerning for possible lytic lesion. MRI may be performed for  further evaluation. 3. Enlarged fibroid uterus. 4. Small gallstone. 5. Aortic atherosclerosis.   08/22/2020 Pathology Results   FINAL MICROSCOPIC DIAGNOSIS: 08/22/20  A. NEEDLE CORE, RIGHT GLUTEUS MINIMUMUS, BIOPSY:  -  Metastatic carcinoma  -  See comment   COMMENT:   By immunohistochemistry, the neoplastic cells are positive for  cytokeratin-7 and have weak patchy positivity for GATA3 but are negative  for cytokeratin-20 and GCDFP.  The combined morphology and  immunophenotype, support metastasis from the patient's known breast  primary carcinoma.  Dr. Burr Medico was notified of these results on 08/24/2020.  Prognostic markers (ER, PR, Her2, PDL-1) are pending and will be  reported in an addendum   ADDENDUM:   PROGNOSTIC INDICATOR RESULTS:   Immunohistochemical and morphometric analysis performed manually   The tumor cells are NEGATIVE for Her2 (0).   Estrogen Receptor:       POSITIVE, 20%, WEAK STAINING  Progesterone Receptor:   NEGATIVE    PD-L1 (0) negative    09/04/2020 - 09/22/2020 Radiation Therapy   Palliative radiation to right iliac 09/04/20 - 09/22/20, Dr. Lisbeth Renshaw    Chemotherapy   Pending first line weekly Taxol starting week of 09/26/20       09/27/2020 -  Chemotherapy    Patient is on Treatment Plan: BREAST PACLITAXEL D1,8,15 Q28D         CURRENT THERAPY:  Palliative radiation to right iliac 09/04/20 - 09/22/20, Dr. Lisbeth Renshaw Pending first line weekly Taxol starting week of 09/26/20  INTERVAL HISTORY:  Kristin Ward is here for a follow up. She presents to the clinic alone. She notes since starting radiation her right hip pain is much better. She notes she has MS Contin q12 hours, but does not want to use it due to drowsiness. She notes her availability is limited due to her needing family to transport her. She denies any newer pain. She does have some constipation from radiation. She notes she is on Linzess and glycerine suppository.     REVIEW OF SYSTEMS:    Constitutional: Denies fevers, chills or abnormal weight loss Eyes: Denies blurriness of vision Ears, nose, mouth, throat, and face: Denies mucositis or sore throat Respiratory: Denies cough, dyspnea or wheezes Cardiovascular: Denies palpitation, chest discomfort or lower extremity swelling Gastrointestinal:  Denies nausea, heartburn (+) Mild constipation  Skin: Denies abnormal skin rashes MSK: (+) Improved right hip pain  Lymphatics: Denies new lymphadenopathy or easy bruising Neurological:Denies numbness, tingling or new weaknesses Behavioral/Psych: Mood is stable, no new changes  All other systems were reviewed with the patient and are negative.  MEDICAL HISTORY:  Past Medical History:  Diagnosis Date  . Abnormal x-ray of lungs with single pulmonary nodule 03/15/2016   per records from [place]   . Anemia   . Asthma   . Cholelithiasis 05/19/2017   On CT  . Coronary artery calcification seen on CAT scan 05/19/2017  . Diabetes mellitus without complication ([place])    type 2  . Dilated idiopathic cardiomyopathy ([place]) 12/2015   EF 15-20%. [facility]  . Headache    NONE RECENT  . Hypertension   . Lymph nodes enlarged 07/25/2020  . Malignant neoplasm of lower-outer quadrant of left breast of female, estrogen receptor negative ([place]) 06/11/2017  left breast  . Mixed hyperlipidemia 06/17/2018  . partially blind   . Personal history of chemotherapy 2018-2019  . Personal history of radiation therapy 2019  . Retinitis pigmentosa   . Retroperitoneal lymphadenopathy 07/25/2020  . Uveitis     SURGICAL HISTORY: Past Surgical History:  Procedure Laterality Date  . brain cyst removed  2007   to help with headaches per patient , aspirated   . BREAST BIOPSY Left 06/06/2017   x2  . BREAST CYST ASPIRATION Left 06/06/2017  . BREAST LUMPECTOMY Left 06/26/2017  . BREAST LUMPECTOMY WITH RADIOACTIVE SEED AND SENTINEL LYMPH NODE BIOPSY Left 06/26/2017    Procedure: BREAST LUMPECTOMY WITH RADIOACTIVE SEED AND SENTINEL LYMPH NODE BIOPSY ERAS  PATHWAY;  Surgeon: [name], MD;  Location[place];  Service: General;  Laterality: Left;  pec block  . FINE NEEDLE ASPIRATION Left 02/23/2019   Procedure: FINE NEEDLE ASPIRATION;  Surgeon: [name], MD;  Location: [place];  Service: General;  Laterality: Left;  Asiration of left breast seroma   . NASAL TURBINATE REDUCTION    . PORT-A-CATH REMOVAL Right 02/23/2019   Procedure: REMOVAL PORT-A-CATH;  Surgeon: [name], MD;  Location: [place];  Service: General;  Laterality: Right;  . PORTACATH PLACEMENT N/A 06/26/2017   Procedure: INSERTION PORT-A-CATH;  Surgeon: [name], MD;  Location: [place];  Service: General;  Laterality: N/A;  . TRANSTHORACIC ECHOCARDIOGRAM  12/2015   A) [facility] May 2017: Mild concentric LVH. Global hypokinesis. GR daily. EF 18% severe LA dilation. Mitral annular dilatation with papillary muscle dysfunction and moderate MR. Dilated IVC consistent with elevated RAP.  Marland Kitchen TRANSTHORACIC ECHOCARDIOGRAM  05/2017; 08/2017   a) Mild concentric LVH.  EF 45 to 50% with diffuse hypokinesis.  GR 1 DD.  Mild aortic root dilation.;; b)  Mild LVH EF 45 to 50%.  Diffuse HK.  No significant valve disease.  No significant change.    I have reviewed the social history and family history with the patient and they are unchanged from previous note.  ALLERGIES:  is allergic to latex, tylenol with codeine #3 [acetaminophen-codeine], and penicillins.  MEDICATIONS:  Current Outpatient Medications  Medication Sig Dispense Refill  . aspirin 81 MG chewable tablet Chew 81 mg by mouth daily.    . carvedilol (COREG) 12.5 MG tablet Take 1 tablet (12.5 mg total) by mouth 2 (two) times daily with a meal. 180 tablet 1  . Cholecalciferol (D3-1000 PO) Take by mouth.    . diclofenac (VOLTAREN) 75 MG EC tablet Take 1 tablet (75 mg total) by mouth 2 (two) times  daily. 60 tablet 1  . furosemide (LASIX) 20 MG tablet Take 2 tablets (40 mg total) by mouth as needed. FOR WEIGHT GAIN  3 TO 4 LBS. (Patient not taking: Reported on 08/30/2020) 90 tablet 3  . glucose blood (ACCU-CHEK GUIDE) test strip Use as instructed to test blood sugar 2 times a day 100 strip 2  . ibuprofen (ADVIL) 800 MG tablet Take 1 tablet (800 mg total) by mouth every 8 (eight) hours as needed. 30 tablet 2  . linaclotide (LINZESS) 72 MCG capsule Take 1 capsule (72 mcg total) by mouth daily before breakfast. 30 capsule 1  . Multiple Vitamins-Minerals (WOMENS MULTI PO) Take by mouth.  (Patient not taking: Reported on 08/30/2020)    . ondansetron (ZOFRAN) 8 MG tablet Take 1 tablet (8 mg total) by mouth every 8 (eight) hours as needed for nausea or vomiting. 20 tablet 0  .  ondansetron (ZOFRAN) 8 MG tablet Take 1 tablet (8 mg total) by mouth 2 (two) times daily as needed (Nausea or vomiting). 30 tablet 1  . oxyCODONE (OXY IR/ROXICODONE) 5 MG immediate release tablet Take 1 tablet (5 mg total) by mouth every 6 (six) hours as needed for severe pain. 60 tablet 0  . oxyCODONE-acetaminophen (PERCOCET) 5-325 MG tablet Take as needed twice a day for severe pain, or before scan 20 tablet 0  . prochlorperazine (COMPAZINE) 10 MG tablet Take 1 tablet (10 mg total) by mouth every 6 (six) hours as needed (Nausea or vomiting). 30 tablet 1  . Saccharomyces boulardii (PROBIOTIC) 250 MG CAPS Take by mouth.    . sacubitril-valsartan (ENTRESTO) 97-103 MG Take 1 tablet by mouth 2 (two) times daily. (Patient not taking: Reported on 08/30/2020) 180 tablet 3  . Semaglutide (RYBELSUS) 3 MG TABS Take 3 mg by mouth daily with breakfast. (Patient not taking: Reported on 08/30/2020) 30 tablet 3  . spironolactone (ALDACTONE) 25 MG tablet Take 1 tablet (25 mg total) by mouth daily. 90 tablet 3  . vitamin B-12 (CYANOCOBALAMIN) 500 MCG tablet Take 500 mcg by mouth daily. (Patient not taking: Reported on 08/30/2020)    . vitamin C  (ASCORBIC ACID) 500 MG tablet Take 1,000 mg 2 (two) times daily by mouth.     No current facility-administered medications for this visit.    PHYSICAL EXAMINATION: ECOG PERFORMANCE STATUS: 3 - Symptomatic, >50% confined to bed  Vitals:   09/15/20 1320  BP: (!) 122/96  Pulse: 83  Resp: 15  Temp: 98.5 F (36.9 C)  SpO2: 98%   Filed Weights   09/15/20 1320  Weight: 218 lb (98.9 kg)    Due to COVID19 we will limit examination to appearance. Patient had no complaints.  GENERAL:alert, no distress and comfortable SKIN: skin color normal, no rashes or significant lesions EYES: normal, Conjunctiva are pink and non-injected, sclera clear  NEURO: alert & oriented x 3 with fluent speech    LABORATORY DATA:  I have reviewed the data as listed CBC Latest Ref Rng & Units 09/15/2020 08/16/2020 06/23/2020  WBC 4.0 - 10.5 K/uL 4.1 7.1 6.7  Hemoglobin 12.0 - 15.0 g/dL 9.6(L) 10.6(L) 12.6  Hematocrit 36.0 - 46.0 % 30.0(L) 32.7(L) 37.9  Platelets 150 - 400 K/uL 386 401(H) 364.0     CMP Latest Ref Rng & Units 09/15/2020 08/16/2020 06/23/2020  Glucose 70 - 99 mg/dL 117(H) 105(H) 100(H)  BUN 8 - 23 mg/dL _0 Creatinine 0.44 - 1.00 mg/dL 0.81 0.83 0.79  Sodium 135 - 145 mmol/L 146(H) 142 141  Potassium 3.5 - 5.1 mmol/L 4.4 4.0 4.5  Chloride 98 - 111 mmol/L 109 108 107  CO2 22 - 32 mmol/L _1 Calcium 8.9 - 10.3 mg/dL 8.9 9.6 9.6  Total Protein 6.5 - 8.1 g/dL 7.1 7.4 -  Total Bilirubin 0.3 - 1.2 mg/dL 0.4 0.4 -  Alkaline Phos 38 - 126 U/L 85 87 -  AST 15 - 41 U/L 25 22 -  ALT 0 - 44 U/L 14 18 -      RADIOGRAPHIC STUDIES: I have personally reviewed the radiological images as listed and agreed with the findings in the report. NM PET Image Initial (PI) Skull Base To Thigh  Result Date: 09/14/2020 CLINICAL DATA:  Subsequent treatment strategy for breast carcinoma with soft tissue metastasis. EXAM: NUCLEAR MEDICINE PET SKULL BASE TO THIGH TECHNIQUE: 11.6 mCi F-18 FDG was injected  intravenously. Full-ring PET  imaging was performed from the skull base to thigh after the radiotracer. CT data was obtained and used for attenuation correction and anatomic localization. Fasting blood glucose: 90 mg/dl COMPARISON:  CT abdomen pelvis 08/15/2020 FINDINGS: Mediastinal blood pool activity: SUV max 1.7 Liver activity: SUV max 3.3 NECK: No hypermetabolic lymph nodes in the neck. Incidental CT findings: none CHEST: Multiple small hypermetabolic lower mediastinal and hilar lymph nodes. For example RIGHT lower paratracheal node SUV max equal 8.8. Subcarinal lymph node with SUV max equal 8.9. LEFT hilar lymph node with SUV max equal 7.0. Within the LEFT breast there is a low-density collection. Superficial to this collection toward the skin surface is a focus of metabolic activity adjacent to a surgical clip SUV max equal 4.1. Incidental CT findings: Bibasilar atelectasis. Hypermetabolic or suspicious pulmonary nodules. ABDOMEN/PELVIS: No abnormal metabolic activity in the liver. Hypermetabolic retrocrural node with SUV max equal 8.3 on image 107. Hypermetabolic periaortic lymph nodes just above the bifurcation between the aorta and IVC with SUV max equal 15.5. Nodes are relatively small measuring approximately 10 mm short axis. Hypermetabolic adenopathy extends along the RIGHT iliac chain. Example node measures 16 mm with SUV max equal 10.0. Hypermetabolic thickening of the iliacus muscle as well as the gluteus muscle posterior to the RIGHT iliac wing. This combines to a uniform mass spanning the RIGHT iliac wing measuring 7.3 x 8.9 cm with SUV max equal 14.9. No additional evidence of skeletal metastasis. Incidental CT findings: Large lobular uterus without hypermetabolic LEFT and RIGHT ovaries are hypermetabolic which is benign finding. SKELETON: Metastatic lesion in the RIGHT iliac wing which extends into the adjacent musculature as described above. Incidental CT findings: none IMPRESSION: 1.  Hypermetabolic mass centered in the RIGHT iliac crest with extension into the musculature anterior and posterior to the bone. 2. Hypermetabolic adenopathy along the RIGHT iliac chain extending into the periaortic retroperitoneum about the bifurcation. 3. Hypermetabolic adenopathy in the retrocrural space. 4. Hypermetabolic mediastinal and hilar lymphadenopathy. 5. Small focus of metabolic activity in the LEFT breast is adjacent to a surgical collection. Electronically Signed   By: Suzy Bouchard M.D.   On: 09/14/2020 16:31     ASSESSMENT & PLAN:  Nieshia Larmon is a 65 y.o. female with     1. Left breast invasive ductal carcinoma, stage IB, p(T1cN0M0), Triple negative, Grade 3, in 2018, metastatic disease to right gluteal mass/iliac and diffuse node metastasis in 08/2020 ER 20% weakly + PR/HER2 (0) negative, PD-L1 0% -She was initially diagnosed in 05/2017. She has high risk for recurrence due to triple negative disease. She is s/p left breast lumpectomy, adjuvant chemo TC and Radiation.  -Genetic testing was negative for pathogenetic mutations.  -Due to recent right gluteal pain, she underwent biopsy on 09/02/20 which showed metastatic carcinoma, consistent with breast primary. ER 20% positive, ER and HER2 negative.  -We discussed her PET from 09/13/20 which shows known Hypermetabolic mass centered in the RIGHT iliac crest, Hypermetabolic adenopathy along the RIGHT iliac chain, Hypermetabolic adenopathy in the retrocrural space and Hypermetabolic mediastinal and hilar lymphadenopathy. I personally reviewed images with patient today.  -I discussed with stage IV metastatic breast cancer, her cancer is no longer curable but still treatable. Goal of therapy is palliative to control her disease and prolong her life. Given disease burden, I recommend starting with chemo weekly Taxol. We also discussed other options such as oral Xeloda. Given she is PD-L1 negative, she is not eligible for immunotherapy such  as Keytruda.   --  Chemotherapy consent: Side effects including but does not limited to, fatigue, nausea, vomiting, diarrhea, hair loss, neuropathy, fluid retention, renal and kidney dysfunction, neutropenic fever, needed for blood transfusion, bleeding, were discussed with patient in great detail. She agrees to proceed. Plan to start week of 09/26/20 after she complete palliative radiation.  -I discussed if she does well on chemo for the first 6-9 months, we can change treatment to anti estrogen therapy without chemo, given her ER weekly positive marker.  -I discussed option of PAC placement given long term IV use. She is willing to proceed with PAC if she needs it.  -F/u with start of treatment. I encouraged her to bring list of her herbs and supplements to her next visit.     2.  Intractable right hip pain -She notes she had recurrent right hip pain since 2020. This now radiates down her right hip and buttock pain. This has caused right leg Weakness -Her right hip pain is caused by the large infiltrative mass around the right iliac wing, Secondary to metastatic disease.  -She is currently undergoing Palliative radiation to right iliac 09/04/20 - 09/22/20 with Dr. Lisbeth Renshaw. Her pain is improving since starting Radiation.  -She is fine to stop MS Contin as it causes her drowsiness (09/15/20). She can continue Oxycodone 50m as needed for pain.    3. HTN, DM, COPD, retinitis pigmentosa with limited vision, Chronic combined systolic and diastolic heart failure, EF 45-50% -Continue to follow-up with PCP Dr HRaenette Roverand cardiologist Dr HEllyn Hack -She is legally blind   4. Anemia -She developed anemia during adjuvant chemotherapy, which resolved after she completed chemo -She had recurrent mild anemia with Hgb 11.7 in 01/2019. Labs on 04/02/2019 showed normal iron, ferritin, folate, and MMA -Remains mild and intermittent.   5. Transportation  -She is transported by her family, but given their tight availability,  I will refer her to SW to set up transportation. She is agreeable as she feels she can walk herself to and from the ride assist.     Plan -Continue Radiation until 09/22/20 -she plan to stop long acting narcotics  -Lab, F/u and weekly Taxol starting week of 2/15      No problem-specific Assessment & Plan notes found for this encounter.   No orders of the defined types were placed in this encounter.  All questions were answered. The patient knows to call the clinic with any problems, questions or concerns. No barriers to learning was detected. The total time spent in the appointment was 40 minutes.     YTruitt Merle MD 09/15/2020   I, AJoslyn Devon am acting as scribe for YTruitt Merle MD.   I have reviewed the above documentation for accuracy and completeness, and I agree with the above.

## 2020-09-14 ENCOUNTER — Ambulatory Visit
Admission: RE | Admit: 2020-09-14 | Discharge: 2020-09-14 | Disposition: A | Discharge status: Home / Self Care | Payer: Medicare Other | Source: Ambulatory Visit | Attending: Radiation Oncology | Admitting: Radiation Oncology

## 2020-09-14 ENCOUNTER — Ambulatory Visit (HOSPITAL_COMMUNITY)
Admission: RE | Admit: 2020-09-14 | Discharge: 2020-09-14 | Disposition: A | Discharge status: Home / Self Care | Payer: Medicare Other | Source: Ambulatory Visit | Attending: Hematology | Admitting: Hematology

## 2020-09-14 DIAGNOSIS — Z171 Estrogen receptor negative status [ER-]: Secondary | ICD-10-CM | POA: Insufficient documentation

## 2020-09-14 DIAGNOSIS — Z510 Encounter for antineoplastic radiation therapy: Secondary | ICD-10-CM | POA: Diagnosis not present

## 2020-09-14 DIAGNOSIS — R590 Localized enlarged lymph nodes: Secondary | ICD-10-CM | POA: Insufficient documentation

## 2020-09-14 DIAGNOSIS — C50512 Malignant neoplasm of lower-outer quadrant of left female breast: Secondary | ICD-10-CM | POA: Diagnosis not present

## 2020-09-14 DIAGNOSIS — C50919 Malignant neoplasm of unspecified site of unspecified female breast: Secondary | ICD-10-CM | POA: Diagnosis not present

## 2020-09-14 DIAGNOSIS — C7989 Secondary malignant neoplasm of other specified sites: Secondary | ICD-10-CM | POA: Diagnosis not present

## 2020-09-14 DIAGNOSIS — C7951 Secondary malignant neoplasm of bone: Secondary | ICD-10-CM | POA: Diagnosis not present

## 2020-09-14 DIAGNOSIS — G893 Neoplasm related pain (acute) (chronic): Secondary | ICD-10-CM | POA: Diagnosis not present

## 2020-09-14 LAB — GLUCOSE, CAPILLARY: Glucose-Capillary: 92 mg/dL (ref 70–99)

## 2020-09-14 MED ORDER — FLUDEOXYGLUCOSE F - 18 (FDG) INJECTION
11.6000 | Freq: Once | INTRAVENOUS | Status: AC | PRN
  Administered 2020-09-14: 11.6 via INTRAVENOUS

## 2020-09-15 ENCOUNTER — Inpatient Hospital Stay: Payer: Medicare Other

## 2020-09-15 ENCOUNTER — Ambulatory Visit
Admission: RE | Admit: 2020-09-15 | Discharge: 2020-09-15 | Disposition: A | Discharge status: Home / Self Care | Payer: Medicare Other | Source: Ambulatory Visit | Attending: Radiation Oncology | Admitting: Radiation Oncology

## 2020-09-15 ENCOUNTER — Ambulatory Visit: Payer: Medicare HMO | Admitting: Internal Medicine

## 2020-09-15 ENCOUNTER — Inpatient Hospital Stay: Payer: Medicare Other | Attending: Hematology | Admitting: Hematology

## 2020-09-15 ENCOUNTER — Other Ambulatory Visit: Payer: Self-pay

## 2020-09-15 ENCOUNTER — Encounter: Payer: Self-pay | Admitting: Hematology

## 2020-09-15 VITALS — BP 122/96 | HR 83 | Temp 98.5°F | Resp 15 | Ht 67.0 in | Wt 218.0 lb

## 2020-09-15 DIAGNOSIS — D649 Anemia, unspecified: Secondary | ICD-10-CM | POA: Diagnosis not present

## 2020-09-15 DIAGNOSIS — I110 Hypertensive heart disease with heart failure: Secondary | ICD-10-CM | POA: Insufficient documentation

## 2020-09-15 DIAGNOSIS — G893 Neoplasm related pain (acute) (chronic): Secondary | ICD-10-CM | POA: Insufficient documentation

## 2020-09-15 DIAGNOSIS — H3552 Pigmentary retinal dystrophy: Secondary | ICD-10-CM | POA: Insufficient documentation

## 2020-09-15 DIAGNOSIS — Z171 Estrogen receptor negative status [ER-]: Secondary | ICD-10-CM

## 2020-09-15 DIAGNOSIS — J449 Chronic obstructive pulmonary disease, unspecified: Secondary | ICD-10-CM | POA: Diagnosis not present

## 2020-09-15 DIAGNOSIS — Z510 Encounter for antineoplastic radiation therapy: Secondary | ICD-10-CM | POA: Diagnosis not present

## 2020-09-15 DIAGNOSIS — E119 Type 2 diabetes mellitus without complications: Secondary | ICD-10-CM | POA: Diagnosis not present

## 2020-09-15 DIAGNOSIS — C50512 Malignant neoplasm of lower-outer quadrant of left female breast: Secondary | ICD-10-CM

## 2020-09-15 DIAGNOSIS — C7989 Secondary malignant neoplasm of other specified sites: Secondary | ICD-10-CM | POA: Insufficient documentation

## 2020-09-15 DIAGNOSIS — Z5111 Encounter for antineoplastic chemotherapy: Secondary | ICD-10-CM | POA: Diagnosis not present

## 2020-09-15 DIAGNOSIS — I10 Essential (primary) hypertension: Secondary | ICD-10-CM | POA: Diagnosis not present

## 2020-09-15 DIAGNOSIS — I5042 Chronic combined systolic (congestive) and diastolic (congestive) heart failure: Secondary | ICD-10-CM | POA: Insufficient documentation

## 2020-09-15 DIAGNOSIS — Z7189 Other specified counseling: Secondary | ICD-10-CM | POA: Insufficient documentation

## 2020-09-15 DIAGNOSIS — C7951 Secondary malignant neoplasm of bone: Secondary | ICD-10-CM | POA: Diagnosis not present

## 2020-09-15 LAB — CBC WITH DIFFERENTIAL/PLATELET
Abs Immature Granulocytes: 0.01 10*3/uL (ref 0.00–0.07)
Basophils Absolute: 0 10*3/uL (ref 0.0–0.1)
Basophils Relative: 0 %
Eosinophils Absolute: 0.2 10*3/uL (ref 0.0–0.5)
Eosinophils Relative: 4 %
HCT: 30 % — ABNORMAL LOW (ref 36.0–46.0)
Hemoglobin: 9.6 g/dL — ABNORMAL LOW (ref 12.0–15.0)
Immature Granulocytes: 0 %
Lymphocytes Relative: 17 %
Lymphs Abs: 0.7 10*3/uL (ref 0.7–4.0)
MCH: 29.2 pg (ref 26.0–34.0)
MCHC: 32 g/dL (ref 30.0–36.0)
MCV: 91.2 fL (ref 80.0–100.0)
Monocytes Absolute: 0.5 10*3/uL (ref 0.1–1.0)
Monocytes Relative: 12 %
Neutro Abs: 2.7 10*3/uL (ref 1.7–7.7)
Neutrophils Relative %: 67 %
Platelets: 386 10*3/uL (ref 150–400)
RBC: 3.29 MIL/uL — ABNORMAL LOW (ref 3.87–5.11)
RDW: 14.4 % (ref 11.5–15.5)
WBC: 4.1 10*3/uL (ref 4.0–10.5)
nRBC: 0 % (ref 0.0–0.2)

## 2020-09-15 LAB — COMPREHENSIVE METABOLIC PANEL
ALT: 14 U/L (ref 0–44)
AST: 25 U/L (ref 15–41)
Albumin: 3.4 g/dL — ABNORMAL LOW (ref 3.5–5.0)
Alkaline Phosphatase: 85 U/L (ref 38–126)
Anion gap: 9 (ref 5–15)
BUN: 13 mg/dL (ref 8–23)
CO2: 28 mmol/L (ref 22–32)
Calcium: 8.9 mg/dL (ref 8.9–10.3)
Chloride: 109 mmol/L (ref 98–111)
Creatinine, Ser: 0.81 mg/dL (ref 0.44–1.00)
GFR, Estimated: >60 mL/min (ref >60)
Glucose, Bld: 117 mg/dL — ABNORMAL HIGH (ref 70–99)
Potassium: 4.4 mmol/L (ref 3.5–5.1)
Sodium: 146 mmol/L — ABNORMAL HIGH (ref 135–145)
Total Bilirubin: 0.4 mg/dL (ref 0.3–1.2)
Total Protein: 7.1 g/dL (ref 6.5–8.1)

## 2020-09-15 MED ORDER — PROCHLORPERAZINE MALEATE 10 MG PO TABS: 10.0000 mg | ORAL_TABLET | Freq: Four times a day (QID) | ORAL | 1 refills | Status: DC | PRN

## 2020-09-15 MED ORDER — ONDANSETRON HCL 8 MG PO TABS: 8.0000 mg | ORAL_TABLET | Freq: Two times a day (BID) | ORAL | 1 refills | Status: DC | PRN

## 2020-09-15 NOTE — Progress Notes (Signed)
DISCONTINUE ON PATHWAY REGIMEN - Breast     A cycle is every 21 days:     Docetaxel      Cyclophosphamide   **Always confirm dose/schedule in your pharmacy ordering system**  REASON: Disease Progression PRIOR TREATMENT: [id number]: TC - Docetaxel, Cyclophosphamide q21 Days x 4 Cycles TREATMENT RESPONSE: Unable to Evaluate  START ON PATHWAY REGIMEN - Breast     A cycle is every 28 days (3 weeks on and 1 week off):     Paclitaxel   **Always confirm dose/schedule in your pharmacy ordering system**  Patient Characteristics: Distant Metastases or Locoregional Recurrent Disease - Unresected or Locally Advanced Unresectable Disease Progressing after Neoadjuvant and Local Therapies, HER2 Negative/Unknown/Equivocal, ER Positive, Chemotherapy, First Line Therapeutic Status: Distant Metastases ER Status: Positive (+) HER2 Status: Negative (-) PR Status: Negative (-) Therapy Approach Indicated: Standard Chemotherapy/Endocrine Therapy Line of Therapy: First Line Intent of Therapy: Non-Curative / Palliative Intent, Discussed with Patient

## 2020-09-18 ENCOUNTER — Ambulatory Visit
Admission: RE | Admit: 2020-09-18 | Discharge: 2020-09-18 | Disposition: A | Discharge status: Home / Self Care | Payer: Medicare Other | Source: Ambulatory Visit | Attending: Radiation Oncology | Admitting: Radiation Oncology

## 2020-09-18 ENCOUNTER — Other Ambulatory Visit: Payer: Self-pay

## 2020-09-18 DIAGNOSIS — G893 Neoplasm related pain (acute) (chronic): Secondary | ICD-10-CM | POA: Diagnosis not present

## 2020-09-18 DIAGNOSIS — Z510 Encounter for antineoplastic radiation therapy: Secondary | ICD-10-CM | POA: Diagnosis not present

## 2020-09-18 DIAGNOSIS — C50512 Malignant neoplasm of lower-outer quadrant of left female breast: Secondary | ICD-10-CM | POA: Diagnosis not present

## 2020-09-18 DIAGNOSIS — C7951 Secondary malignant neoplasm of bone: Secondary | ICD-10-CM | POA: Diagnosis not present

## 2020-09-19 ENCOUNTER — Ambulatory Visit
Admission: RE | Admit: 2020-09-19 | Discharge: 2020-09-19 | Disposition: A | Discharge status: Home / Self Care | Payer: Medicare Other | Source: Ambulatory Visit | Attending: Radiation Oncology | Admitting: Radiation Oncology

## 2020-09-19 DIAGNOSIS — C7951 Secondary malignant neoplasm of bone: Secondary | ICD-10-CM | POA: Diagnosis not present

## 2020-09-19 DIAGNOSIS — G893 Neoplasm related pain (acute) (chronic): Secondary | ICD-10-CM | POA: Diagnosis not present

## 2020-09-19 DIAGNOSIS — C50512 Malignant neoplasm of lower-outer quadrant of left female breast: Secondary | ICD-10-CM | POA: Diagnosis not present

## 2020-09-19 DIAGNOSIS — Z510 Encounter for antineoplastic radiation therapy: Secondary | ICD-10-CM | POA: Diagnosis not present

## 2020-09-19 NOTE — Progress Notes (Signed)
Pharmacist Chemotherapy Monitoring - Initial Assessment    Anticipated start date: 09/26/20   Regimen:  . Are orders appropriate based on the patient's diagnosis, regimen, and cycle? Yes . Does the plan date match the patient's scheduled date? Yes . Is the sequencing of drugs appropriate? Yes . Are the premedications appropriate for the patient's regimen? Yes . Prior Authorization for treatment is: Pending o If applicable, is the correct biosimilar selected based on the patient's insurance? not applicable  Organ Function and Labs: Marland Kitchen Are dose adjustments needed based on the patient's renal function, hepatic function, or hematologic function? No . Are appropriate labs ordered prior to the start of patient's treatment? Yes . Other organ system assessment, if indicated: N/A . The following baseline labs, if indicated, have been ordered: N/A  Dose Assessment: . Are the drug doses appropriate? Yes . Are the following correct: o Drug concentrations Yes o IV fluid compatible with drug Yes o Administration routes Yes o Timing of therapy Yes . If applicable, does the patient have documented access for treatment and/or plans for port-a-cath placement? not applicable . If applicable, have lifetime cumulative doses been properly documented and assessed? not applicable Lifetime Dose Tracking  No doses have been documented on this patient for the following tracked chemicals: Doxorubicin, Epirubicin, Idarubicin, Daunorubicin, Mitoxantrone, Bleomycin, Oxaliplatin, Carboplatin, Liposomal Doxorubicin  o   Toxicity Monitoring/Prevention: . The patient has the following take home antiemetics prescribed: Ondansetron, Prochlorperazine, Dexamethasone and Lorazepam . The patient has the following take home medications prescribed: N/A . Medication allergies and previous infusion related reactions, if applicable, have been reviewed and addressed. Yes . The patient's current medication list has been assessed  for drug-drug interactions with their chemotherapy regimen. no significant drug-drug interactions were identified on review.  Order Review: . Are the treatment plan orders signed? Yes . Is the patient scheduled to see a provider prior to their treatment? No  I verify that I have reviewed each item in the above checklist and answered each question accordingly  Romualdo Bolk, RPh

## 2020-09-20 ENCOUNTER — Ambulatory Visit
Admission: RE | Admit: 2020-09-20 | Discharge: 2020-09-20 | Disposition: A | Discharge status: Home / Self Care | Payer: Medicare Other | Source: Ambulatory Visit | Attending: Radiation Oncology | Admitting: Radiation Oncology

## 2020-09-20 DIAGNOSIS — Z510 Encounter for antineoplastic radiation therapy: Secondary | ICD-10-CM | POA: Diagnosis not present

## 2020-09-20 DIAGNOSIS — C50512 Malignant neoplasm of lower-outer quadrant of left female breast: Secondary | ICD-10-CM | POA: Diagnosis not present

## 2020-09-20 DIAGNOSIS — C7951 Secondary malignant neoplasm of bone: Secondary | ICD-10-CM | POA: Diagnosis not present

## 2020-09-20 DIAGNOSIS — G893 Neoplasm related pain (acute) (chronic): Secondary | ICD-10-CM | POA: Diagnosis not present

## 2020-09-21 ENCOUNTER — Encounter: Payer: Self-pay | Admitting: General Practice

## 2020-09-21 ENCOUNTER — Ambulatory Visit
Admission: RE | Admit: 2020-09-21 | Discharge: 2020-09-21 | Disposition: A | Discharge status: Home / Self Care | Payer: Medicare Other | Source: Ambulatory Visit | Attending: Radiation Oncology | Admitting: Radiation Oncology

## 2020-09-21 DIAGNOSIS — G893 Neoplasm related pain (acute) (chronic): Secondary | ICD-10-CM | POA: Diagnosis not present

## 2020-09-21 DIAGNOSIS — C50512 Malignant neoplasm of lower-outer quadrant of left female breast: Secondary | ICD-10-CM | POA: Diagnosis not present

## 2020-09-21 DIAGNOSIS — C7951 Secondary malignant neoplasm of bone: Secondary | ICD-10-CM | POA: Diagnosis not present

## 2020-09-21 DIAGNOSIS — Z510 Encounter for antineoplastic radiation therapy: Secondary | ICD-10-CM | POA: Diagnosis not present

## 2020-09-21 NOTE — Progress Notes (Signed)
[facility] CSW Progress Notes  Patient came to [employer], wanted information on [employer], support groups, Advance Directives and financial assistance options.  Called patient, assembled appropriate resources for her - she will pick these up tomorrow before her radiation appointment.  She also wants to register for the breast cancer support group.    Edwyna Shell, LCSW Clinical Social Worker Phone:  [phone]

## 2020-09-22 ENCOUNTER — Ambulatory Visit
Admission: RE | Admit: 2020-09-22 | Discharge: 2020-09-22 | Disposition: A | Discharge status: Home / Self Care | Payer: Medicare Other | Source: Ambulatory Visit | Attending: Radiation Oncology | Admitting: Radiation Oncology

## 2020-09-22 ENCOUNTER — Other Ambulatory Visit: Payer: Self-pay

## 2020-09-22 ENCOUNTER — Encounter: Payer: Self-pay | Admitting: Radiation Oncology

## 2020-09-22 DIAGNOSIS — Z510 Encounter for antineoplastic radiation therapy: Secondary | ICD-10-CM | POA: Diagnosis not present

## 2020-09-22 DIAGNOSIS — C50512 Malignant neoplasm of lower-outer quadrant of left female breast: Secondary | ICD-10-CM | POA: Diagnosis not present

## 2020-09-22 DIAGNOSIS — C7951 Secondary malignant neoplasm of bone: Secondary | ICD-10-CM | POA: Diagnosis not present

## 2020-09-22 DIAGNOSIS — G893 Neoplasm related pain (acute) (chronic): Secondary | ICD-10-CM | POA: Diagnosis not present

## 2020-09-24 NOTE — Progress Notes (Deleted)
[place]   [phone] [phone]   Clinic Follow up Note   Patient Care Team: Girtha Rm, NP-C as PCP - General (Family Medicine) [name], MD as PCP - Cardiology (Cardiology) [name], MD as Consulting Physician (Hematology) [name], MD as Consulting Physician (General Surgery) 09/24/2020  CHIEF COMPLAINT: Follow up metastatic breast cancer   SUMMARY OF ONCOLOGIC HISTORY: Oncology History Overview Note  Cancer Staging Malignant neoplasm of lower-outer quadrant of left breast of female, estrogen receptor negative ([place]) Staging form: Breast, AJCC 8th Edition - Clinical stage from 06/06/2017: Stage IB (cT1c, cN0, cM0, G3, ER: Negative, PR: Negative, HER2: Negative) - Signed by [name], MD on 06/15/2017 - Pathologic stage from 06/26/2017: Stage IB (pT1c, pN0, cM0, G3, ER: Negative, PR: Negative, HER2: Negative) - Signed by [name], MD on 07/10/2017     Malignant neoplasm of lower-outer quadrant of left breast of female, estrogen receptor negative ([place])  05/30/2017 Mammogram   Diagnostic mammo and US IMPRESSION: 1. Highly suspicious 1.4 cm mass in the slightly lower slightly outer left breast -tissue sampling recommended. 2. Indeterminate 0.5 mm mass in the slightly lower slightly outer left breast -tissue sampling recommended. 3. At least 2 left axillary lymph nodes with borderline cortical thickness.   06/06/2017 Initial Biopsy   Diagnosis 1. Breast, left, needle core biopsy, 5:30 o'clock - INVASIVE DUCTAL CARCINOMA, G3 2. Lymph node, needle/core biopsy, left axillary - NO CARCINOMA IDENTIFIED IN ONE LYMPH NODE (0/1)   06/06/2017 Initial Diagnosis   Malignant neoplasm of lower-outer quadrant of left breast of female, estrogen receptor negative ([place])   06/06/2017 Receptors her2   Estrogen Receptor: 0%, NEGATIVE Progesterone Receptor: 0%, NEGATIVE Proliferation Marker Ki67: 70%   06/26/2017 Surgery   LEFT  BREAST LUMPECTOMY WITH RADIOACTIVE SEED AND SENTINEL LYMPH NODE BIOPSY ERAS  PATHWAY AND INSERTION PORT-A-CATH By Dr. Barry Dienes on 06/26/17    06/26/2017 Pathology Results   Diagnosis 06/26/17  1. Breast, lumpectomy, Left - INVASIVE DUCTAL CARCINOMA, GRADE III/III, SPANNING 1.2 CM. - THE SURGICAL RESECTION MARGINS ARE NEGATIVE FOR CARCINOMA. - SEE ONCOLOGY TABLE BELOW. 2. Lymph node, sentinel, biopsy, Left axillary #1 - THERE IS NO EVIDENCE OF CARCINOMA IN 1 OF 1 LYMPH NODE (0/1). 3. Lymph node, sentinel, biopsy, Left axillary #2 - THERE IS NO EVIDENCE OF CARCINOMA IN 1 OF 1 LYMPH NODE (0/1). 4. Lymph node, sentinel, biopsy, Left axillary #3 - THERE IS NO EVIDENCE OF CARCINOMA IN 1 OF 1 LYMPH NODE (0/1).    07/25/2017 - 09/29/2017 Chemotherapy   Adjuvant cytoxan and docetaxel (TC) every 3 weekd for 4 cycles     11/19/2017 - 12/17/2017 Radiation Therapy   Adjuvant breast radiation Left breast treated to 42.5 Gy with 17 fx of 2.5 Gy followed by a boost of 7.5 Gy with 3 fx of 2.5 Gy     02/2019 Procedure   She had PAC removal in 02/2019.    07/23/2020 Imaging   MRI Lumbar Spine 07/23/20 IMPRESSION: 1. No significant disc herniation, spinal canal or neural foraminal stenosis at any level. 2. Moderate facet degenerative changes at L3-4, L4-5 and L5-S1. 3. Multiple enlarged retroperitoneal and right iliac lymph nodes. Recommend correlation with CT of the abdomen and pelvis with contrast.   08/15/2020 Imaging   CT AP 08/15/20  IMPRESSION: 1. Right iliac and periaortic adenopathy is noted concerning for metastatic disease or lymphoma. Also noted is abnormal soft tissue mass anterior and posterior to the right iliac wing concerning  for malignancy or metastatic disease. MRI is recommended for further evaluation. 2. Irregular lucency with sclerotic margins is seen involving the right iliac crest concerning for possible lytic lesion. MRI may be performed for further evaluation. 3. Enlarged  fibroid uterus. 4. Small gallstone. 5. Aortic atherosclerosis.   08/22/2020 Pathology Results   FINAL MICROSCOPIC DIAGNOSIS: 08/22/20  A. NEEDLE CORE, RIGHT GLUTEUS MINIMUMUS, BIOPSY:  -  Metastatic carcinoma  -  See comment   COMMENT:   By immunohistochemistry, the neoplastic cells are positive for  cytokeratin-7 and have weak patchy positivity for GATA3 but are negative  for cytokeratin-20 and GCDFP.  The combined morphology and  immunophenotype, support metastasis from the patient's known breast  primary carcinoma.  Dr. Burr Medico was notified of these results on 08/24/2020.  Prognostic markers (ER, PR, Her2, PDL-1) are pending and will be  reported in an addendum   ADDENDUM:   PROGNOSTIC INDICATOR RESULTS:   Immunohistochemical and morphometric analysis performed manually   The tumor cells are NEGATIVE for Her2 (0).   Estrogen Receptor:       POSITIVE, 20%, WEAK STAINING  Progesterone Receptor:   NEGATIVE    PD-L1 (0) negative    09/04/2020 - 09/22/2020 Radiation Therapy   Palliative radiation to right iliac 09/04/20 - 09/22/20, Dr. Lisbeth Renshaw    Chemotherapy   Pending first line weekly Taxol starting week of 09/26/20       09/27/2020 -  Chemotherapy    Patient is on Treatment Plan: BREAST PACLITAXEL D1,8,15 Q28D        CURRENT THERAPY: Palliative radiation to right iliac 09/04/20 - 09/22/20, Dr. Lisbeth Renshaw Pending first line weekly Taxol starting week of 09/26/20  INTERVAL HISTORY: [name] returns for follow up and treatment as scheduled. She completed RT 09/22/20.    REVIEW OF SYSTEMS:   Constitutional: Denies fevers, chills or abnormal weight loss Eyes: Denies blurriness of vision Ears, nose, mouth, throat, and face: Denies mucositis or sore throat Respiratory: Denies cough, dyspnea or wheezes Cardiovascular: Denies palpitation, chest discomfort or lower extremity swelling Gastrointestinal:  Denies nausea, heartburn or change in bowel habits Skin: Denies abnormal skin  rashes Lymphatics: Denies new lymphadenopathy or easy bruising Neurological:Denies numbness, tingling or new weaknesses Behavioral/Psych: Mood is stable, no new changes  All other systems were reviewed with the patient and are negative.  MEDICAL HISTORY:  Past Medical History:  Diagnosis Date  . Abnormal x-ray of lungs with single pulmonary nodule 03/15/2016   per records from [place]   . Anemia   . Asthma   . Cholelithiasis 05/19/2017   On CT  . Coronary artery calcification seen on CAT scan 05/19/2017  . Diabetes mellitus without complication ([place])    type 2  . Dilated idiopathic cardiomyopathy ([place]) 12/2015   EF 15-20%. Diagnosed in [facility]  . Headache    NONE RECENT  . Hypertension   . Lymph nodes enlarged 07/25/2020  . Malignant neoplasm of lower-outer quadrant of left breast of female, estrogen receptor negative ([place]) 06/11/2017   left breast  . Mixed hyperlipidemia 06/17/2018  . partially blind   . Personal history of chemotherapy 2018-2019  . Personal history of radiation therapy 2019  . Retinitis pigmentosa   . Retroperitoneal lymphadenopathy 07/25/2020  . Uveitis     SURGICAL HISTORY: Past Surgical History:  Procedure Laterality Date  . brain cyst removed  2007   to help with headaches per patient , aspirated   . BREAST BIOPSY Left 06/06/2017   x2  . BREAST  CYST ASPIRATION Left 06/06/2017  . BREAST LUMPECTOMY Left 06/26/2017  . BREAST LUMPECTOMY WITH RADIOACTIVE SEED AND SENTINEL LYMPH NODE BIOPSY Left 06/26/2017   Procedure: BREAST LUMPECTOMY WITH RADIOACTIVE SEED AND SENTINEL LYMPH NODE BIOPSY ERAS  PATHWAY;  Surgeon: [name], MD;  Location: [place];  Service: General;  Laterality: Left;  pec block  . FINE NEEDLE ASPIRATION Left 02/23/2019   Procedure: FINE NEEDLE ASPIRATION;  Surgeon: [name], MD;  Location: [place];  Service: General;  Laterality: Left;  Asiration of left breast seroma   . NASAL TURBINATE  REDUCTION    . PORT-A-CATH REMOVAL Right 02/23/2019   Procedure: REMOVAL PORT-A-CATH;  Surgeon: [name], MD;  Location: [place];  Service: General;  Laterality: Right;  . PORTACATH PLACEMENT N/A 06/26/2017   Procedure: INSERTION PORT-A-CATH;  Surgeon: [name], MD;  Location: [place];  Service: General;  Laterality: N/A;  . TRANSTHORACIC ECHOCARDIOGRAM  12/2015   A) [facility] May 2017: Mild concentric LVH. Global hypokinesis. GR daily. EF 18% severe LA dilation. Mitral annular dilatation with papillary muscle dysfunction and moderate MR. Dilated IVC consistent with elevated RAP.  [facility] TRANSTHORACIC ECHOCARDIOGRAM  05/2017; 08/2017   a) Mild concentric LVH.  EF 45 to 50% with diffuse hypokinesis.  GR 1 DD.  Mild aortic root dilation.;; b)  Mild LVH EF 45 to 50%.  Diffuse HK.  No significant valve disease.  No significant change.    I have reviewed the social history and family history with the patient and they are unchanged from previous note.  ALLERGIES:  is allergic to latex, tylenol with codeine #3 [acetaminophen-codeine], and penicillins.  MEDICATIONS:  Current Outpatient Medications  Medication Sig Dispense Refill  . aspirin 81 MG chewable tablet Chew 81 mg by mouth daily.    . carvedilol (COREG) 12.5 MG tablet Take 1 tablet (12.5 mg total) by mouth 2 (two) times daily with a meal. 180 tablet 1  . Cholecalciferol (D3-1000 PO) Take by mouth.    . diclofenac (VOLTAREN) 75 MG EC tablet Take 1 tablet (75 mg total) by mouth 2 (two) times daily. 60 tablet 1  . furosemide (LASIX) 20 MG tablet Take 2 tablets (40 mg total) by mouth as needed. FOR WEIGHT GAIN  3 TO 4 LBS. (Patient not taking: Reported on 08/30/2020) 90 tablet 3  . glucose blood (ACCU-CHEK GUIDE) test strip Use as instructed to test blood sugar 2 times a day 100 strip 2  . ibuprofen (ADVIL) 800 MG tablet Take 1 tablet (800 mg total) by mouth every 8 (eight) hours as needed. 30 tablet 2  . linaclotide  (LINZESS) 72 MCG capsule Take 1 capsule (72 mcg total) by mouth daily before breakfast. 30 capsule 1  . Multiple Vitamins-Minerals (WOMENS MULTI PO) Take by mouth.  (Patient not taking: Reported on 08/30/2020)    . ondansetron (ZOFRAN) 8 MG tablet Take 1 tablet (8 mg total) by mouth every 8 (eight) hours as needed for nausea or vomiting. 20 tablet 0  . ondansetron (ZOFRAN) 8 MG tablet Take 1 tablet (8 mg total) by mouth 2 (two) times daily as needed (Nausea or vomiting). 30 tablet 1  . oxyCODONE (OXY IR/ROXICODONE) 5 MG immediate release tablet Take 1 tablet (5 mg total) by mouth every 6 (six) hours as needed for severe pain. 60 tablet 0  . oxyCODONE-acetaminophen (PERCOCET) 5-325 MG tablet Take as needed twice a day for severe pain, or before scan 20 tablet 0  . prochlorperazine (COMPAZINE)  10 MG tablet Take 1 tablet (10 mg total) by mouth every 6 (six) hours as needed (Nausea or vomiting). 30 tablet 1  . Saccharomyces boulardii (PROBIOTIC) 250 MG CAPS Take by mouth.    . sacubitril-valsartan (ENTRESTO) 97-103 MG Take 1 tablet by mouth 2 (two) times daily. (Patient not taking: Reported on 08/30/2020) 180 tablet 3  . Semaglutide (RYBELSUS) 3 MG TABS Take 3 mg by mouth daily with breakfast. (Patient not taking: Reported on 08/30/2020) 30 tablet 3  . spironolactone (ALDACTONE) 25 MG tablet Take 1 tablet (25 mg total) by mouth daily. 90 tablet 3  . vitamin B-12 (CYANOCOBALAMIN) 500 MCG tablet Take 500 mcg by mouth daily. (Patient not taking: Reported on 08/30/2020)    . vitamin C (ASCORBIC ACID) 500 MG tablet Take 1,000 mg 2 (two) times daily by mouth.     No current facility-administered medications for this visit.    PHYSICAL EXAMINATION: ECOG PERFORMANCE STATUS: {CHL ONC ECOG PS:[id number]}  There were no vitals filed for this visit. There were no vitals filed for this visit.  GENERAL:alert, no distress and comfortable SKIN: skin color, texture, turgor are normal, no rashes or significant  lesions EYES: normal, Conjunctiva are pink and non-injected, sclera clear OROPHARYNX:no exudate, no erythema and lips, buccal mucosa, and tongue normal  NECK: supple, thyroid normal size, non-tender, without nodularity LYMPH:  no palpable lymphadenopathy in the cervical, axillary or inguinal LUNGS: clear to auscultation and percussion with normal breathing effort HEART: regular rate & rhythm and no murmurs and no lower extremity edema ABDOMEN:abdomen soft, non-tender and normal bowel sounds Musculoskeletal:no cyanosis of digits and no clubbing  NEURO: alert & oriented x 3 with fluent speech, no focal motor/sensory deficits  LABORATORY DATA:  I have reviewed the data as listed CBC Latest Ref Rng & Units 09/15/2020 08/16/2020 06/23/2020  WBC 4.0 - 10.5 K/uL 4.1 7.1 6.7  Hemoglobin 12.0 - 15.0 g/dL 9.6(L) 10.6(L) 12.6  Hematocrit 36.0 - 46.0 % 30.0(L) 32.7(L) 37.9  Platelets 150 - 400 K/uL 386 401(H) 364.0     CMP Latest Ref Rng & Units 09/15/2020 08/16/2020 06/23/2020  Glucose 70 - 99 mg/dL 117(H) 105(H) 100(H)  BUN 8 - 23 mg/dL _0 Creatinine 0.44 - 1.00 mg/dL 0.81 0.83 0.79  Sodium 135 - 145 mmol/L 146(H) 142 141  Potassium 3.5 - 5.1 mmol/L 4.4 4.0 4.5  Chloride 98 - 111 mmol/L 109 108 107  CO2 22 - 32 mmol/L _1 Calcium 8.9 - 10.3 mg/dL 8.9 9.6 9.6  Total Protein 6.5 - 8.1 g/dL 7.1 7.4 -  Total Bilirubin 0.3 - 1.2 mg/dL 0.4 0.4 -  Alkaline Phos 38 - 126 U/L 85 87 -  AST 15 - 41 U/L 25 22 -  ALT 0 - 44 U/L 14 18 -      RADIOGRAPHIC STUDIES: I have personally reviewed the radiological images as listed and agreed with the findings in the report. No results found.   ASSESSMENT & PLAN:  No problem-specific Assessment & Plan notes found for this encounter.   No orders of the defined types were placed in this encounter.  All questions were answered. The patient knows to call the clinic with any problems, questions or concerns. No barriers to learning was detected. I  spent {CHL ONC TIME VISIT - VZDGL:[id number]} counseling the patient face to face. The total time spent in the appointment was {CHL ONC TIME VISIT - JOACZ:[id number]} and more than 50% was on counseling and review  of test results     Alla Feeling, NP 09/24/20

## 2020-09-26 ENCOUNTER — Inpatient Hospital Stay (HOSPITAL_BASED_OUTPATIENT_CLINIC_OR_DEPARTMENT_OTHER): Payer: Medicare Other | Admitting: Hematology

## 2020-09-26 ENCOUNTER — Inpatient Hospital Stay: Payer: Medicare Other

## 2020-09-26 ENCOUNTER — Other Ambulatory Visit: Payer: Self-pay

## 2020-09-26 ENCOUNTER — Encounter: Payer: Self-pay | Admitting: Hematology

## 2020-09-26 VITALS — BP 149/94 | HR 72 | Temp 99.1°F | Resp 18

## 2020-09-26 VITALS — BP 132/83 | HR 86 | Temp 98.0°F | Resp 17 | Ht 67.0 in | Wt 218.6 lb

## 2020-09-26 DIAGNOSIS — C50512 Malignant neoplasm of lower-outer quadrant of left female breast: Secondary | ICD-10-CM

## 2020-09-26 DIAGNOSIS — H3552 Pigmentary retinal dystrophy: Secondary | ICD-10-CM | POA: Diagnosis not present

## 2020-09-26 DIAGNOSIS — Z7189 Other specified counseling: Secondary | ICD-10-CM

## 2020-09-26 DIAGNOSIS — J449 Chronic obstructive pulmonary disease, unspecified: Secondary | ICD-10-CM | POA: Diagnosis not present

## 2020-09-26 DIAGNOSIS — D649 Anemia, unspecified: Secondary | ICD-10-CM | POA: Diagnosis not present

## 2020-09-26 DIAGNOSIS — I10 Essential (primary) hypertension: Secondary | ICD-10-CM | POA: Diagnosis not present

## 2020-09-26 DIAGNOSIS — Z5111 Encounter for antineoplastic chemotherapy: Secondary | ICD-10-CM | POA: Diagnosis not present

## 2020-09-26 DIAGNOSIS — Z171 Estrogen receptor negative status [ER-]: Secondary | ICD-10-CM

## 2020-09-26 DIAGNOSIS — I5042 Chronic combined systolic (congestive) and diastolic (congestive) heart failure: Secondary | ICD-10-CM | POA: Diagnosis not present

## 2020-09-26 DIAGNOSIS — C7989 Secondary malignant neoplasm of other specified sites: Secondary | ICD-10-CM | POA: Diagnosis not present

## 2020-09-26 DIAGNOSIS — I110 Hypertensive heart disease with heart failure: Secondary | ICD-10-CM | POA: Diagnosis not present

## 2020-09-26 DIAGNOSIS — E119 Type 2 diabetes mellitus without complications: Secondary | ICD-10-CM | POA: Diagnosis not present

## 2020-09-26 DIAGNOSIS — G893 Neoplasm related pain (acute) (chronic): Secondary | ICD-10-CM | POA: Diagnosis not present

## 2020-09-26 LAB — CBC WITH DIFFERENTIAL/PLATELET
Abs Immature Granulocytes: 0.03 10*3/uL (ref 0.00–0.07)
Basophils Absolute: 0 10*3/uL (ref 0.0–0.1)
Basophils Relative: 0 %
Eosinophils Absolute: 0.3 10*3/uL (ref 0.0–0.5)
Eosinophils Relative: 8 %
HCT: 31.9 % — ABNORMAL LOW (ref 36.0–46.0)
Hemoglobin: 10.4 g/dL — ABNORMAL LOW (ref 12.0–15.0)
Immature Granulocytes: 1 %
Lymphocytes Relative: 12 %
Lymphs Abs: 0.5 10*3/uL — ABNORMAL LOW (ref 0.7–4.0)
MCH: 29.4 pg (ref 26.0–34.0)
MCHC: 32.6 g/dL (ref 30.0–36.0)
MCV: 90.1 fL (ref 80.0–100.0)
Monocytes Absolute: 0.5 10*3/uL (ref 0.1–1.0)
Monocytes Relative: 12 %
Neutro Abs: 2.8 10*3/uL (ref 1.7–7.7)
Neutrophils Relative %: 67 %
Platelets: 250 10*3/uL (ref 150–400)
RBC: 3.54 MIL/uL — ABNORMAL LOW (ref 3.87–5.11)
RDW: 15.5 % (ref 11.5–15.5)
WBC: 4.2 10*3/uL (ref 4.0–10.5)
nRBC: 0 % (ref 0.0–0.2)

## 2020-09-26 LAB — COMPREHENSIVE METABOLIC PANEL
ALT: 9 U/L (ref 0–44)
AST: 16 U/L (ref 15–41)
Albumin: 3.9 g/dL (ref 3.5–5.0)
Alkaline Phosphatase: 85 U/L (ref 38–126)
Anion gap: 9 (ref 5–15)
BUN: 13 mg/dL (ref 8–23)
CO2: 22 mmol/L (ref 22–32)
Calcium: 9.4 mg/dL (ref 8.9–10.3)
Chloride: 108 mmol/L (ref 98–111)
Creatinine, Ser: 0.88 mg/dL (ref 0.44–1.00)
GFR, Estimated: >60 mL/min (ref >60)
Glucose, Bld: 150 mg/dL — ABNORMAL HIGH (ref 70–99)
Potassium: 3.8 mmol/L (ref 3.5–5.1)
Sodium: 139 mmol/L (ref 135–145)
Total Bilirubin: 0.5 mg/dL (ref 0.3–1.2)
Total Protein: 7.7 g/dL (ref 6.5–8.1)

## 2020-09-26 MED ORDER — DIPHENHYDRAMINE HCL 50 MG/ML IJ SOLN
25.0000 mg | Freq: Once | INTRAMUSCULAR | Status: AC
  Administered 2020-09-26: 25 mg via INTRAVENOUS

## 2020-09-26 MED ORDER — SODIUM CHLORIDE 0.9 % IV SOLN
10.0000 mg | Freq: Once | INTRAVENOUS | Status: AC
  Administered 2020-09-26: 10 mg via INTRAVENOUS
  Filled 2020-09-26: qty 10

## 2020-09-26 MED ORDER — DIPHENHYDRAMINE HCL 50 MG/ML IJ SOLN
INTRAMUSCULAR | Status: AC
  Filled 2020-09-26: qty 1

## 2020-09-26 MED ORDER — SODIUM CHLORIDE 0.9 % IV SOLN
80.0000 mg/m2 | Freq: Once | INTRAVENOUS | Status: AC
  Administered 2020-09-26: 174 mg via INTRAVENOUS
  Filled 2020-09-26: qty 29

## 2020-09-26 MED ORDER — FAMOTIDINE IN NACL 20-0.9 MG/50ML-% IV SOLN
20.0000 mg | Freq: Once | INTRAVENOUS | Status: AC
  Administered 2020-09-26: 20 mg via INTRAVENOUS

## 2020-09-26 MED ORDER — SODIUM CHLORIDE 0.9 % IV SOLN
Freq: Once | INTRAVENOUS | Status: AC
  Filled 2020-09-26: qty 250

## 2020-09-26 MED ORDER — FAMOTIDINE IN NACL 20-0.9 MG/50ML-% IV SOLN
INTRAVENOUS | Status: AC
  Filled 2020-09-26: qty 50

## 2020-09-26 NOTE — Patient Instructions (Addendum)
[facility] Discharge Instructions for Patients Receiving Chemotherapy  Today you received the following chemotherapy agents Paclitaxel (Taxane)  To help prevent nausea and vomiting after your treatment, we encourage you to take your nausea medication as directed.  If you develop nausea and vomiting that is not controlled by your nausea medication, call the clinic.   BELOW ARE SYMPTOMS THAT SHOULD BE REPORTED IMMEDIATELY:  *FEVER GREATER THAN 100.5 F  *CHILLS WITH OR WITHOUT FEVER  NAUSEA AND VOMITING THAT IS NOT CONTROLLED WITH YOUR NAUSEA MEDICATION  *UNUSUAL SHORTNESS OF BREATH  *UNUSUAL BRUISING OR BLEEDING  TENDERNESS IN MOUTH AND THROAT WITH OR WITHOUT PRESENCE OF ULCERS  *URINARY PROBLEMS  *BOWEL PROBLEMS  UNUSUAL RASH Items with * indicate a potential emergency and should be followed up as soon as possible.  Feel free to call the clinic should you have any questions or concerns. The clinic phone number is [phone].  Please show the [place] at check-in to the Emergency Department and triage nurse.  Paclitaxel injection What is this medicine? PACLITAXEL (PAK li TAX el) is a chemotherapy drug. It targets fast dividing cells, like cancer cells, and causes these cells to die. This medicine is used to treat ovarian cancer, breast cancer, lung cancer, Kaposi's sarcoma, and other cancers. This medicine may be used for other purposes; ask your health care provider or pharmacist if you have questions. COMMON BRAND NAME(S): Onxol, Taxol What should I tell my health care provider before I take this medicine? They need to know if you have any of these conditions:  history of irregular heartbeat  liver disease  low blood counts, like low white cell, platelet, or red cell counts  lung or breathing disease, like asthma  tingling of the fingers or toes, or other nerve disorder  an unusual or allergic reaction to paclitaxel, alcohol,  polyoxyethylated castor oil, other chemotherapy, other medicines, foods, dyes, or preservatives  pregnant or trying to get pregnant  breast-feeding How should I use this medicine? This drug is given as an infusion into a vein. It is administered in a hospital or clinic by a specially trained health care professional. Talk to your pediatrician regarding the use of this medicine in children. Special care may be needed. Overdosage: If you think you have taken too much of this medicine contact a poison control center or emergency room at once. NOTE: This medicine is only for you. Do not share this medicine with others. What if I miss a dose? It is important not to miss your dose. Call your doctor or health care professional if you are unable to keep an appointment. What may interact with this medicine? Do not take this medicine with any of the following medications:  live virus vaccines This medicine may also interact with the following medications:  antiviral medicines for hepatitis, HIV or AIDS  certain antibiotics like erythromycin and clarithromycin  certain medicines for fungal infections like ketoconazole and itraconazole  certain medicines for seizures like carbamazepine, phenobarbital, phenytoin  gemfibrozil  nefazodone  rifampin  St. John's wort This list may not describe all possible interactions. Give your health care provider a list of all the medicines, herbs, non-prescription drugs, or dietary supplements you use. Also tell them if you smoke, drink alcohol, or use illegal drugs. Some items may interact with your medicine. What should I watch for while using this medicine? Your condition will be monitored carefully while you are receiving this medicine. You will need important blood work  done while you are taking this medicine. This medicine can cause serious allergic reactions. To reduce your risk you will need to take other medicine(s) before treatment with this  medicine. If you experience allergic reactions like skin rash, itching or hives, swelling of the face, lips, or tongue, tell your doctor or health care professional right away. In some cases, you may be given additional medicines to help with side effects. Follow all directions for their use. This drug may make you feel generally unwell. This is not uncommon, as chemotherapy can affect healthy cells as well as cancer cells. Report any side effects. Continue your course of treatment even though you feel ill unless your doctor tells you to stop. Call your doctor or health care professional for advice if you get a fever, chills or sore throat, or other symptoms of a cold or flu. Do not treat yourself. This drug decreases your body's ability to fight infections. Try to avoid being around people who are sick. This medicine may increase your risk to bruise or bleed. Call your doctor or health care professional if you notice any unusual bleeding. Be careful brushing and flossing your teeth or using a toothpick because you may get an infection or bleed more easily. If you have any dental work done, tell your dentist you are receiving this medicine. Avoid taking products that contain aspirin, acetaminophen, ibuprofen, naproxen, or ketoprofen unless instructed by your doctor. These medicines may hide a fever. Do not become pregnant while taking this medicine. Women should inform their doctor if they wish to become pregnant or think they might be pregnant. There is a potential for serious side effects to an unborn child. Talk to your health care professional or pharmacist for more information. Do not breast-feed an infant while taking this medicine. Men are advised not to father a child while receiving this medicine. This product may contain alcohol. Ask your pharmacist or healthcare provider if this medicine contains alcohol. Be sure to tell all healthcare providers you are taking this medicine. Certain medicines,  like metronidazole and disulfiram, can cause an unpleasant reaction when taken with alcohol. The reaction includes flushing, headache, nausea, vomiting, sweating, and increased thirst. The reaction can last from 30 minutes to several hours. What side effects may I notice from receiving this medicine? Side effects that you should report to your doctor or health care professional as soon as possible:  allergic reactions like skin rash, itching or hives, swelling of the face, lips, or tongue  breathing problems  changes in vision  fast, irregular heartbeat  high or low blood pressure  mouth sores  pain, tingling, numbness in the hands or feet  signs of decreased platelets or bleeding - bruising, pinpoint red spots on the skin, black, tarry stools, blood in the urine  signs of decreased red blood cells - unusually weak or tired, feeling faint or lightheaded, falls  signs of infection - fever or chills, cough, sore throat, pain or difficulty passing urine  signs and symptoms of liver injury like dark yellow or brown urine; general ill feeling or flu-like symptoms; light-colored stools; loss of appetite; nausea; right upper belly pain; unusually weak or tired; yellowing of the eyes or skin  swelling of the ankles, feet, hands  unusually slow heartbeat Side effects that usually do not require medical attention (report to your doctor or health care professional if they continue or are bothersome):  diarrhea  hair loss  loss of appetite  muscle or joint pain  nausea, vomiting  pain, redness, or irritation at site where injected  tiredness This list may not describe all possible side effects. Call your doctor for medical advice about side effects. You may report side effects to FDA at 1-800-FDA-1088. Where should I keep my medicine? This drug is given in a hospital or clinic and will not be stored at home. NOTE: This sheet is a summary. It may not cover all possible information.  If you have questions about this medicine, talk to your doctor, pharmacist, or health care provider.  2021 Elsevier/Gold Standard (2019-06-30 13:37:23)

## 2020-09-26 NOTE — Progress Notes (Signed)
[name]   [phone] [phone]   Clinic Follow up Note   Patient Care Team: Girtha Rm, NP-C as PCP - General (Family Medicine) [name], MD as PCP - Cardiology (Cardiology) [name], MD as Consulting Physician (Hematology) [name], MD as Consulting Physician (General Surgery)  Date of Service:  09/26/2020  CHIEF COMPLAINT: F/u of left breast cancer and pain control   SUMMARY OF ONCOLOGIC HISTORY: Oncology History Overview Note  Cancer Staging Malignant neoplasm of lower-outer quadrant of left breast of female, estrogen receptor negative ([place]) Staging form: Breast, AJCC 8th Edition - Clinical stage from 06/06/2017: Stage IB (cT1c, cN0, cM0, G3, ER: Negative, PR: Negative, HER2: Negative) - Signed by [name], MD on 06/15/2017 - Pathologic stage from 06/26/2017: Stage IB (pT1c, pN0, cM0, G3, ER: Negative, PR: Negative, HER2: Negative) - Signed by [name], MD on 07/10/2017     Malignant neoplasm of lower-outer quadrant of left breast of female, estrogen receptor negative ([place])  05/30/2017 Mammogram   Diagnostic mammo and US IMPRESSION: 1. Highly suspicious 1.4 cm mass in the slightly lower slightly outer left breast -tissue sampling recommended. 2. Indeterminate 0.5 mm mass in the slightly lower slightly outer left breast -tissue sampling recommended. 3. At least 2 left axillary lymph nodes with borderline cortical thickness.   06/06/2017 Initial Biopsy   Diagnosis 1. Breast, left, needle core biopsy, 5:30 o'clock - INVASIVE DUCTAL CARCINOMA, G3 2. Lymph node, needle/core biopsy, left axillary - NO CARCINOMA IDENTIFIED IN ONE LYMPH NODE (0/1)   06/06/2017 Initial Diagnosis   Malignant neoplasm of lower-outer quadrant of left breast of female, estrogen receptor negative ([place])   06/06/2017 Receptors her2   Estrogen Receptor: 0%, NEGATIVE Progesterone Receptor: 0%, NEGATIVE Proliferation Marker Ki67: 70%    06/26/2017 Surgery   LEFT BREAST LUMPECTOMY WITH RADIOACTIVE SEED AND SENTINEL LYMPH NODE BIOPSY ERAS  PATHWAY AND INSERTION PORT-A-CATH By Dr. Barry Dienes on 06/26/17    06/26/2017 Pathology Results   Diagnosis 06/26/17  1. Breast, lumpectomy, Left - INVASIVE DUCTAL CARCINOMA, GRADE III/III, SPANNING 1.2 CM. - THE SURGICAL RESECTION MARGINS ARE NEGATIVE FOR CARCINOMA. - SEE ONCOLOGY TABLE BELOW. 2. Lymph node, sentinel, biopsy, Left axillary #1 - THERE IS NO EVIDENCE OF CARCINOMA IN 1 OF 1 LYMPH NODE (0/1). 3. Lymph node, sentinel, biopsy, Left axillary #2 - THERE IS NO EVIDENCE OF CARCINOMA IN 1 OF 1 LYMPH NODE (0/1). 4. Lymph node, sentinel, biopsy, Left axillary #3 - THERE IS NO EVIDENCE OF CARCINOMA IN 1 OF 1 LYMPH NODE (0/1).    07/25/2017 - 09/29/2017 Chemotherapy   Adjuvant cytoxan and docetaxel (TC) every 3 weekd for 4 cycles     11/19/2017 - 12/17/2017 Radiation Therapy   Adjuvant breast radiation Left breast treated to 42.5 Gy with 17 fx of 2.5 Gy followed by a boost of 7.5 Gy with 3 fx of 2.5 Gy     02/2019 Procedure   She had PAC removal in 02/2019.    07/23/2020 Imaging   MRI Lumbar Spine 07/23/20 IMPRESSION: 1. No significant disc herniation, spinal canal or neural foraminal stenosis at any level. 2. Moderate facet degenerative changes at L3-4, L4-5 and L5-S1. 3. Multiple enlarged retroperitoneal and right iliac lymph nodes. Recommend correlation with CT of the abdomen and pelvis with contrast.   08/15/2020 Imaging   CT AP 08/15/20  IMPRESSION: 1. Right iliac and periaortic adenopathy is noted concerning for metastatic disease or lymphoma. Also noted is abnormal soft tissue mass anterior  and posterior to the right iliac wing concerning for malignancy or metastatic disease. MRI is recommended for further evaluation. 2. Irregular lucency with sclerotic margins is seen involving the right iliac crest concerning for possible lytic lesion. MRI may be performed for  further evaluation. 3. Enlarged fibroid uterus. 4. Small gallstone. 5. Aortic atherosclerosis.   08/22/2020 Pathology Results   FINAL MICROSCOPIC DIAGNOSIS: 08/22/20  A. NEEDLE CORE, RIGHT GLUTEUS MINIMUMUS, BIOPSY:  -  Metastatic carcinoma  -  See comment   COMMENT:   By immunohistochemistry, the neoplastic cells are positive for  cytokeratin-7 and have weak patchy positivity for GATA3 but are negative  for cytokeratin-20 and GCDFP.  The combined morphology and  immunophenotype, support metastasis from the patient's known breast  primary carcinoma.  Dr. Burr Medico was notified of these results on 08/24/2020.  Prognostic markers (ER, PR, Her2, PDL-1) are pending and will be  reported in an addendum   ADDENDUM:   PROGNOSTIC INDICATOR RESULTS:   Immunohistochemical and morphometric analysis performed manually   The tumor cells are NEGATIVE for Her2 (0).   Estrogen Receptor:       POSITIVE, 20%, WEAK STAINING  Progesterone Receptor:   NEGATIVE    PD-L1 (0) negative    09/04/2020 - 09/22/2020 Radiation Therapy   Palliative radiation to right iliac 09/04/20 - 09/22/20, Dr. Lisbeth Renshaw    Chemotherapy   Pending first line weekly Taxol starting week of 09/26/20       09/26/2020 -  Chemotherapy    Patient is on Treatment Plan: BREAST PACLITAXEL D1,8,15 Q28D         CURRENT THERAPY:  first line weekly Taxol starting week of 09/26/20  INTERVAL HISTORY:  Kristin Ward is here for a follow up. She presents to the clinic with her daughter today.  She walked in with a walker.  Her right hip pain has nearly resolved, she is not taking any narcotics now.  She does notice intermittent nausea, and low appetite since she was found to have metastatic breast cancer, similar to the nausea and anorexia while she was on chemo 3 years ago. No vomiting, she still eats adequately,  She has gained 6 lbs since 3 weeks ago.  She is little nervous about starting chemo today, she lost her vision completely  when she was on chemo 3 years ago, did recover later on (she is legally blind, has very limited vision still), and is concerned about loosing her vision completely again from chemo.   All other systems were reviewed with the patient and are negative.  MEDICAL HISTORY:  Past Medical History:  Diagnosis Date  . Abnormal x-ray of lungs with single pulmonary nodule 03/15/2016   per records from [place]   . Anemia   . Asthma   . Cholelithiasis 05/19/2017   On CT  . Coronary artery calcification seen on CAT scan 05/19/2017  . Diabetes mellitus without complication ([place])    type 2  . Dilated idiopathic cardiomyopathy ([place]) 12/2015   EF 15-20%. Diagnosed in [facility]  . Headache    NONE RECENT  . Hypertension   . Lymph nodes enlarged 07/25/2020  . Malignant neoplasm of lower-outer quadrant of left breast of female, estrogen receptor negative ([place]) 06/11/2017   left breast  . Mixed hyperlipidemia 06/17/2018  . partially blind   . Personal history of chemotherapy 2018-2019  . Personal history of radiation therapy 2019  . Retinitis pigmentosa   . Retroperitoneal lymphadenopathy 07/25/2020  . Uveitis  SURGICAL HISTORY: Past Surgical History:  Procedure Laterality Date  . brain cyst removed  2007   to help with headaches per patient , aspirated   . BREAST BIOPSY Left 06/06/2017   x2  . BREAST CYST ASPIRATION Left 06/06/2017  . BREAST LUMPECTOMY Left 06/26/2017  . BREAST LUMPECTOMY WITH RADIOACTIVE SEED AND SENTINEL LYMPH NODE BIOPSY Left 06/26/2017   Procedure: BREAST LUMPECTOMY WITH RADIOACTIVE SEED AND SENTINEL LYMPH NODE BIOPSY ERAS  PATHWAY;  Surgeon: [name], MD;  Location: [place];  Service: General;  Laterality: Left;  pec block  . FINE NEEDLE ASPIRATION Left 02/23/2019   Procedure: FINE NEEDLE ASPIRATION;  Surgeon: [name], MD;  Location: [place];  Service: General;  Laterality: Left;  Asiration of left breast seroma   . NASAL  TURBINATE REDUCTION    . PORT-A-CATH REMOVAL Right 02/23/2019   Procedure: REMOVAL PORT-A-CATH;  Surgeon: [name], MD;  Location: [place];  Service: General;  Laterality: Right;  . PORTACATH PLACEMENT N/A 06/26/2017   Procedure: INSERTION PORT-A-CATH;  Surgeon: [name], MD;  Location: [place];  Service: General;  Laterality: N/A;  . TRANSTHORACIC ECHOCARDIOGRAM  12/2015   A) [facility] May 2017: Mild concentric LVH. Global hypokinesis. GR daily. EF 18% severe LA dilation. Mitral annular dilatation with papillary muscle dysfunction and moderate MR. Dilated IVC consistent with elevated RAP.  [facility] TRANSTHORACIC ECHOCARDIOGRAM  05/2017; 08/2017   a) Mild concentric LVH.  EF 45 to 50% with diffuse hypokinesis.  GR 1 DD.  Mild aortic root dilation.;; b)  Mild LVH EF 45 to 50%.  Diffuse HK.  No significant valve disease.  No significant change.    I have reviewed the social history and family history with the patient and they are unchanged from previous note.  ALLERGIES:  is allergic to latex, tylenol with codeine #3 [acetaminophen-codeine], and penicillins.  MEDICATIONS:  Current Outpatient Medications  Medication Sig Dispense Refill  . aspirin 81 MG chewable tablet Chew 81 mg by mouth daily.    . carvedilol (COREG) 12.5 MG tablet Take 1 tablet (12.5 mg total) by mouth 2 (two) times daily with a meal. 180 tablet 1  . Cholecalciferol (D3-1000 PO) Take by mouth.    . diclofenac (VOLTAREN) 75 MG EC tablet Take 1 tablet (75 mg total) by mouth 2 (two) times daily. 60 tablet 1  . furosemide (LASIX) 20 MG tablet Take 2 tablets (40 mg total) by mouth as needed. FOR WEIGHT GAIN  3 TO 4 LBS. (Patient not taking: Reported on 08/30/2020) 90 tablet 3  . glucose blood (ACCU-CHEK GUIDE) test strip Use as instructed to test blood sugar 2 times a day 100 strip 2  . ibuprofen (ADVIL) 800 MG tablet Take 1 tablet (800 mg total) by mouth every 8 (eight) hours as needed. 30 tablet 2  .  linaclotide (LINZESS) 72 MCG capsule Take 1 capsule (72 mcg total) by mouth daily before breakfast. 30 capsule 1  . Multiple Vitamins-Minerals (WOMENS MULTI PO) Take by mouth.  (Patient not taking: Reported on 08/30/2020)    . ondansetron (ZOFRAN) 8 MG tablet Take 1 tablet (8 mg total) by mouth every 8 (eight) hours as needed for nausea or vomiting. 20 tablet 0  . ondansetron (ZOFRAN) 8 MG tablet Take 1 tablet (8 mg total) by mouth 2 (two) times daily as needed (Nausea or vomiting). 30 tablet 1  . oxyCODONE (OXY IR/ROXICODONE) 5 MG immediate release tablet Take 1 tablet (5 mg total) by  mouth every 6 (six) hours as needed for severe pain. 60 tablet 0  . oxyCODONE-acetaminophen (PERCOCET) 5-325 MG tablet Take as needed twice a day for severe pain, or before scan 20 tablet 0  . prochlorperazine (COMPAZINE) 10 MG tablet Take 1 tablet (10 mg total) by mouth every 6 (six) hours as needed (Nausea or vomiting). 30 tablet 1  . Saccharomyces boulardii (PROBIOTIC) 250 MG CAPS Take by mouth.    . sacubitril-valsartan (ENTRESTO) 97-103 MG Take 1 tablet by mouth 2 (two) times daily. (Patient not taking: Reported on 08/30/2020) 180 tablet 3  . Semaglutide (RYBELSUS) 3 MG TABS Take 3 mg by mouth daily with breakfast. (Patient not taking: Reported on 08/30/2020) 30 tablet 3  . spironolactone (ALDACTONE) 25 MG tablet Take 1 tablet (25 mg total) by mouth daily. 90 tablet 3  . vitamin B-12 (CYANOCOBALAMIN) 500 MCG tablet Take 500 mcg by mouth daily. (Patient not taking: Reported on 08/30/2020)    . vitamin C (ASCORBIC ACID) 500 MG tablet Take 1,000 mg 2 (two) times daily by mouth.     No current facility-administered medications for this visit.   Facility-Administered Medications Ordered in Other Visits  Medication Dose Route Frequency Provider Last Rate Last Admin  . PACLitaxel (TAXOL) 174 mg in sodium chloride 0.9 % 250 mL chemo infusion (</= 32m/m2)  80 mg/m2 (Treatment Plan Recorded) Intravenous Once FTruitt Merle MD  140 mL/hr at 09/26/20 1618 Rate Change at 09/26/20 1618    PHYSICAL EXAMINATION: ECOG PERFORMANCE STATUS: 2  Vitals:   09/26/20 1331  BP: 132/83  Pulse: 86  Resp: 17  Temp: 98 F (36.7 C)  SpO2: 99%   Filed Weights   09/26/20 1331  Weight: 218 lb 9.6 oz (99.2 kg)    Due to COVID19 we will limit examination to appearance. Patient had no complaints.  GENERAL:alert, no distress and comfortable SKIN: skin color normal, no rashes or significant lesions EYES: normal, Conjunctiva are pink and non-injected, sclera clear  NEURO: alert & oriented x 3 with fluent speech    LABORATORY DATA:  I have reviewed the data as listed CBC Latest Ref Rng & Units 09/26/2020 09/15/2020 08/16/2020  WBC 4.0 - 10.5 K/uL 4.2 4.1 7.1  Hemoglobin 12.0 - 15.0 g/dL 10.4(L) 9.6(L) 10.6(L)  Hematocrit 36.0 - 46.0 % 31.9(L) 30.0(L) 32.7(L)  Platelets 150 - 400 K/uL 250 386 401(H)     CMP Latest Ref Rng & Units 09/26/2020 09/15/2020 08/16/2020  Glucose 70 - 99 mg/dL 150(H) 117(H) 105(H)  BUN 8 - 23 mg/dL _0 Creatinine 0.44 - 1.00 mg/dL 0.88 0.81 0.83  Sodium 135 - 145 mmol/L 139 146(H) 142  Potassium 3.5 - 5.1 mmol/L 3.8 4.4 4.0  Chloride 98 - 111 mmol/L 108 109 108  CO2 22 - 32 mmol/L _1 Calcium 8.9 - 10.3 mg/dL 9.4 8.9 9.6  Total Protein 6.5 - 8.1 g/dL 7.7 7.1 7.4  Total Bilirubin 0.3 - 1.2 mg/dL 0.5 0.4 0.4  Alkaline Phos 38 - 126 U/L 85 85 87  AST 15 - 41 U/L _2 ALT 0 - 44 U/L _3 RADIOGRAPHIC STUDIES: I have personally reviewed the radiological images as listed and agreed with the findings in the report. No results found.   ASSESSMENT & PLAN:  Kristin Ward a 65y.o. female with     1. Left breast invasive ductal carcinoma, stage IB, p(T1cN0M0), Triple negative, Grade 3, in  2018, metastatic disease to right gluteal mass/iliac and diffuse node metastasis in 08/2020 ER 20% weakly + PR/HER2 (0) negative, PD-L1 0% -She was initially diagnosed in 05/2017. She  has high risk for recurrence due to triple negative disease. She is s/p left breast lumpectomy, adjuvant chemo TC and Radiation.  -Genetic testing was negative for pathogenetic mutations.  -Due to recent right gluteal pain, she underwent biopsy on 09/02/20 which showed metastatic carcinoma, consistent with breast primary. ER 20% positive, ER and HER2 negative.  -per her daughter's request, I personally reviewed her PET images from 09/13/20 with her daughter today.   -I discussed with stage IV metastatic breast cancer, her cancer is no longer curable but still treatable. Goal of therapy is palliative to control her disease and prolong her life.  -Plan to start first-line chemotherapy with weekly Taxol today, benefit and side effects discussed with her again, she voiced good understanding and agrees to proceed. -we also discussed other treatment options including oral and iv chemo  -I discussed if she does well on chemo for 6 months, we can change treatment to antiestrogen therapy without chemo, given her ER weekly positive diease -her tumor was negative for PD-L1, she is not a candidate for immunotherapy -I discussed option of PAC placement given long term IV use. She is willing to proceed  -f/u in 2 weeks     2.  Intractable right hip pain -She notes she had recurrent right hip pain since 2020. This now radiates down her right hip and buttock pain. This has caused right leg Weakness -Her right hip pain is caused by the large infiltrative mass around the right iliac wing, Secondary to metastatic disease.  -She is currently undergoing Palliative radiation to right iliac 09/04/20 - 09/22/20 with Dr. Lisbeth Renshaw. Her pain is near resolved after radiation.  -she is off narcotics now    3. HTN, DM, COPD, retinitis pigmentosa with limited vision, Chronic combined systolic and diastolic heart failure, EF 45-50% -Continue to follow-up with PCP Dr Raenette Rover and cardiologist Dr Ellyn Hack  -She is legally blind -We  discussed that her blood pressure and blood glucose will likely be impacted by chemotherapy, will follow up closely   4. Anemia -She developed anemia during adjuvant chemotherapy, which resolved after she completed chemo -She had recurrent mild anemia with Hgb 11.7 in 01/2019. Labs on 04/02/2019 showed normal iron, ferritin, folate, and MMA -Remains mild and intermittent.   5. Goal of care discussion  -We again discussed the incurable nature of her cancer, and the overall poor prognosis, especially if she does not have good response to chemotherapy or progress on chemo -The patient understands the goal of care is palliative. -she is full code now     Plan -Lab reviewed, adequate to proceed cycle 1 day 1 Taxol today  -she will return next week for chemo -f/u in 2 weeks before C1D15 chemo  -port placement by IR     No problem-specific Assessment & Plan notes found for this encounter.   Orders Placed This Encounter  Procedures  . IR IMAGING GUIDED PORT INSERTION    Standing Status:   Future    Standing Expiration Date:   09/26/2021    Order Specific Question:   Reason for Exam (SYMPTOM  OR DIAGNOSIS REQUIRED)    Answer:   chemotherapy    Order Specific Question:   Preferred Imaging Location?    Answer:   [facility]   Pt's daughter had many questions. All questions were  answered. The patient knows to call the clinic with any problems, questions or concerns. No barriers to learning was detected. The total time spent in the appointment was 40 minutes.     [name], MD 09/26/2020   I, Joslyn Devon, am acting as scribe for [name], MD.   I have reviewed the above documentation for accuracy and completeness, and I agree with the above.

## 2020-09-28 NOTE — Progress Notes (Signed)
  Patient Name: Kristin Ward MRN: [id number] DOB: 10/13/1955 Referring Physician: [name] (Profile Not Attached) Date of Service: 09/22/2020 [facility], [place]                                                        End Of Treatment Note  Diagnoses: C50.512-Malignant neoplasm of lower-outer quadrant of left female breast C79.51-Secondary malignant neoplasm of bone  Cancer Staging: Recurrent Metastatic Stage IB, pT1cN0M0 grade 3 triple negative invasive ductal carcinoma of the left breast with bone metastasis.  Intent: Palliative  Radiation Treatment Dates: 09/04/2020 through 09/22/2020 Site Technique Total Dose (Gy) Dose per Fx (Gy) Completed Fx Beam Energies  Pelvis: Pelvis 3D 37.5/37.5 2.5 15/15 15X   Narrative: The patient tolerated radiation therapy relatively well. Her pain in the right hip/iliac region had started to improve.  Plan: The patient will receive a call in about one month from the radiation oncology department. She will continue follow up with Dr. Burr Medico as well.  ________________________________________________    Carola Rhine, [facility]

## 2020-10-03 ENCOUNTER — Inpatient Hospital Stay: Payer: Medicare Other

## 2020-10-03 ENCOUNTER — Other Ambulatory Visit: Payer: Self-pay

## 2020-10-03 VITALS — BP 124/83 | HR 75 | Temp 98.6°F | Resp 18 | Ht 67.0 in | Wt 210.1 lb

## 2020-10-03 DIAGNOSIS — D649 Anemia, unspecified: Secondary | ICD-10-CM | POA: Diagnosis not present

## 2020-10-03 DIAGNOSIS — C50512 Malignant neoplasm of lower-outer quadrant of left female breast: Secondary | ICD-10-CM | POA: Diagnosis not present

## 2020-10-03 DIAGNOSIS — G893 Neoplasm related pain (acute) (chronic): Secondary | ICD-10-CM | POA: Diagnosis not present

## 2020-10-03 DIAGNOSIS — E119 Type 2 diabetes mellitus without complications: Secondary | ICD-10-CM | POA: Diagnosis not present

## 2020-10-03 DIAGNOSIS — I110 Hypertensive heart disease with heart failure: Secondary | ICD-10-CM | POA: Diagnosis not present

## 2020-10-03 DIAGNOSIS — Z171 Estrogen receptor negative status [ER-]: Secondary | ICD-10-CM

## 2020-10-03 DIAGNOSIS — J449 Chronic obstructive pulmonary disease, unspecified: Secondary | ICD-10-CM | POA: Diagnosis not present

## 2020-10-03 DIAGNOSIS — I10 Essential (primary) hypertension: Secondary | ICD-10-CM | POA: Diagnosis not present

## 2020-10-03 DIAGNOSIS — Z5111 Encounter for antineoplastic chemotherapy: Secondary | ICD-10-CM | POA: Diagnosis not present

## 2020-10-03 DIAGNOSIS — Z7189 Other specified counseling: Secondary | ICD-10-CM

## 2020-10-03 DIAGNOSIS — H3552 Pigmentary retinal dystrophy: Secondary | ICD-10-CM | POA: Diagnosis not present

## 2020-10-03 DIAGNOSIS — C7989 Secondary malignant neoplasm of other specified sites: Secondary | ICD-10-CM | POA: Diagnosis not present

## 2020-10-03 DIAGNOSIS — I5042 Chronic combined systolic (congestive) and diastolic (congestive) heart failure: Secondary | ICD-10-CM | POA: Diagnosis not present

## 2020-10-03 LAB — COMPREHENSIVE METABOLIC PANEL
ALT: 16 U/L (ref 0–44)
AST: 17 U/L (ref 15–41)
Albumin: 3.9 g/dL (ref 3.5–5.0)
Alkaline Phosphatase: 78 U/L (ref 38–126)
Anion gap: 6 (ref 5–15)
BUN: 14 mg/dL (ref 8–23)
CO2: 22 mmol/L (ref 22–32)
Calcium: 9 mg/dL (ref 8.9–10.3)
Chloride: 109 mmol/L (ref 98–111)
Creatinine, Ser: 0.78 mg/dL (ref 0.44–1.00)
GFR, Estimated: >60 mL/min (ref >60)
Glucose, Bld: 108 mg/dL — ABNORMAL HIGH (ref 70–99)
Potassium: 4.2 mmol/L (ref 3.5–5.1)
Sodium: 137 mmol/L (ref 135–145)
Total Bilirubin: 0.4 mg/dL (ref 0.3–1.2)
Total Protein: 7.4 g/dL (ref 6.5–8.1)

## 2020-10-03 LAB — CBC WITH DIFFERENTIAL (CANCER CENTER ONLY)
Abs Immature Granulocytes: 0.03 10*3/uL (ref 0.00–0.07)
Basophils Absolute: 0 10*3/uL (ref 0.0–0.1)
Basophils Relative: 1 %
Eosinophils Absolute: 0.3 10*3/uL (ref 0.0–0.5)
Eosinophils Relative: 10 %
HCT: 30.9 % — ABNORMAL LOW (ref 36.0–46.0)
Hemoglobin: 10.2 g/dL — ABNORMAL LOW (ref 12.0–15.0)
Immature Granulocytes: 1 %
Lymphocytes Relative: 19 %
Lymphs Abs: 0.5 10*3/uL — ABNORMAL LOW (ref 0.7–4.0)
MCH: 29.8 pg (ref 26.0–34.0)
MCHC: 33 g/dL (ref 30.0–36.0)
MCV: 90.4 fL (ref 80.0–100.0)
Monocytes Absolute: 0.2 10*3/uL (ref 0.1–1.0)
Monocytes Relative: 6 %
Neutro Abs: 1.9 10*3/uL (ref 1.7–7.7)
Neutrophils Relative %: 63 %
Platelet Count: 334 10*3/uL (ref 150–400)
RBC: 3.42 MIL/uL — ABNORMAL LOW (ref 3.87–5.11)
RDW: 15.2 % (ref 11.5–15.5)
WBC Count: 2.9 10*3/uL — ABNORMAL LOW (ref 4.0–10.5)
nRBC: 0 % (ref 0.0–0.2)

## 2020-10-03 MED ORDER — DIPHENHYDRAMINE HCL 50 MG/ML IJ SOLN
25.0000 mg | Freq: Once | INTRAMUSCULAR | Status: AC
  Administered 2020-10-03: 25 mg via INTRAVENOUS

## 2020-10-03 MED ORDER — SODIUM CHLORIDE 0.9 % IV SOLN
10.0000 mg | Freq: Once | INTRAVENOUS | Status: AC
  Administered 2020-10-03: 10 mg via INTRAVENOUS
  Filled 2020-10-03: qty 10

## 2020-10-03 MED ORDER — DIPHENHYDRAMINE HCL 50 MG/ML IJ SOLN
INTRAMUSCULAR | Status: AC
  Filled 2020-10-03: qty 1

## 2020-10-03 MED ORDER — SODIUM CHLORIDE 0.9 % IV SOLN
Freq: Once | INTRAVENOUS | Status: AC
  Filled 2020-10-03: qty 250

## 2020-10-03 MED ORDER — SODIUM CHLORIDE 0.9 % IV SOLN
80.0000 mg/m2 | Freq: Once | INTRAVENOUS | Status: AC
  Administered 2020-10-03: 174 mg via INTRAVENOUS
  Filled 2020-10-03: qty 29

## 2020-10-03 MED ORDER — FAMOTIDINE IN NACL 20-0.9 MG/50ML-% IV SOLN
INTRAVENOUS | Status: AC
  Filled 2020-10-03: qty 50

## 2020-10-03 MED ORDER — FAMOTIDINE IN NACL 20-0.9 MG/50ML-% IV SOLN
20.0000 mg | Freq: Once | INTRAVENOUS | Status: AC
  Administered 2020-10-03: 20 mg via INTRAVENOUS

## 2020-10-03 NOTE — Patient Instructions (Signed)
[facility] Discharge Instructions for Patients Receiving Chemotherapy  Today you received the following chemotherapy agents:  Taxol.  To help prevent nausea and vomiting after your treatment, we encourage you to take your nausea medication as directed.   If you develop nausea and vomiting that is not controlled by your nausea medication, call the clinic.   BELOW ARE SYMPTOMS THAT SHOULD BE REPORTED IMMEDIATELY:  *FEVER GREATER THAN 100.5 F  *CHILLS WITH OR WITHOUT FEVER  NAUSEA AND VOMITING THAT IS NOT CONTROLLED WITH YOUR NAUSEA MEDICATION  *UNUSUAL SHORTNESS OF BREATH  *UNUSUAL BRUISING OR BLEEDING  TENDERNESS IN MOUTH AND THROAT WITH OR WITHOUT PRESENCE OF ULCERS  *URINARY PROBLEMS  *BOWEL PROBLEMS  UNUSUAL RASH Items with * indicate a potential emergency and should be followed up as soon as possible.  Feel free to call the clinic should you have any questions or concerns. The clinic phone number is [phone].  Please show the CHEMO ALERT CARD at check-in to the Emergency Department and triage nurse.   

## 2020-10-06 NOTE — Progress Notes (Signed)
[place]   [phone] Fax:([phone]   Clinic Follow up Note   Patient Care Team: Girtha Rm, NP-C as PCP - General (Family Medicine) [name], MD as PCP - Cardiology (Cardiology) [name], MD as Consulting Physician (Hematology) [name], MD as Consulting Physician (General Surgery)  Date of Service:  10/09/2020  CHIEF COMPLAINT: F/u ofleft breastcancer and pain control   SUMMARY OF ONCOLOGIC HISTORY: Oncology History Overview Note  Cancer Staging Malignant neoplasm of lower-outer quadrant of left breast of female, estrogen receptor negative ([place]) Staging form: Breast, AJCC 8th Edition - Clinical stage from 06/06/2017: Stage IB (cT1c, cN0, cM0, G3, ER: Negative, PR: Negative, HER2: Negative) - Signed by [name], MD on 06/15/2017 - Pathologic stage from 06/26/2017: Stage IB (pT1c, pN0, cM0, G3, ER: Negative, PR: Negative, HER2: Negative) - Signed by [name], MD on 07/10/2017     Malignant neoplasm of lower-outer quadrant of left breast of female, estrogen receptor negative ([place])  05/30/2017 Mammogram   Diagnostic mammo and US IMPRESSION: 1. Highly suspicious 1.4 cm mass in the slightly lower slightly outer left breast -tissue sampling recommended. 2. Indeterminate 0.5 mm mass in the slightly lower slightly outer left breast -tissue sampling recommended. 3. At least 2 left axillary lymph nodes with borderline cortical thickness.   06/06/2017 Initial Biopsy   Diagnosis 1. Breast, left, needle core biopsy, 5:30 o'clock - INVASIVE DUCTAL CARCINOMA, G3 2. Lymph node, needle/core biopsy, left axillary - NO CARCINOMA IDENTIFIED IN ONE LYMPH NODE (0/1)   06/06/2017 Initial Diagnosis   Malignant neoplasm of lower-outer quadrant of left breast of female, estrogen receptor negative ([place])   06/06/2017 Receptors her2   Estrogen Receptor: 0%, NEGATIVE Progesterone Receptor: 0%, NEGATIVE Proliferation Marker Ki67: 70%    06/26/2017 Surgery   LEFT BREAST LUMPECTOMY WITH RADIOACTIVE SEED AND SENTINEL LYMPH NODE BIOPSY ERAS  PATHWAY AND INSERTION PORT-A-CATH By Dr. Barry Dienes on 06/26/17    06/26/2017 Pathology Results   Diagnosis 06/26/17  1. Breast, lumpectomy, Left - INVASIVE DUCTAL CARCINOMA, GRADE III/III, SPANNING 1.2 CM. - THE SURGICAL RESECTION MARGINS ARE NEGATIVE FOR CARCINOMA. - SEE ONCOLOGY TABLE BELOW. 2. Lymph node, sentinel, biopsy, Left axillary #1 - THERE IS NO EVIDENCE OF CARCINOMA IN 1 OF 1 LYMPH NODE (0/1). 3. Lymph node, sentinel, biopsy, Left axillary #2 - THERE IS NO EVIDENCE OF CARCINOMA IN 1 OF 1 LYMPH NODE (0/1). 4. Lymph node, sentinel, biopsy, Left axillary #3 - THERE IS NO EVIDENCE OF CARCINOMA IN 1 OF 1 LYMPH NODE (0/1).    07/25/2017 - 09/29/2017 Chemotherapy   Adjuvant cytoxan and docetaxel (TC) every 3 weekd for 4 cycles     11/19/2017 - 12/17/2017 Radiation Therapy   Adjuvant breast radiation Left breast treated to 42.5 Gy with 17 fx of 2.5 Gy followed by a boost of 7.5 Gy with 3 fx of 2.5 Gy     02/2019 Procedure   She had PAC removal in 02/2019.    07/23/2020 Imaging   MRI Lumbar Spine 07/23/20 IMPRESSION: 1. No significant disc herniation, spinal canal or neural foraminal stenosis at any level. 2. Moderate facet degenerative changes at L3-4, L4-5 and L5-S1. 3. Multiple enlarged retroperitoneal and right iliac lymph nodes. Recommend correlation with CT of the abdomen and pelvis with contrast.   08/15/2020 Imaging   CT AP 08/15/20  IMPRESSION: 1. Right iliac and periaortic adenopathy is noted concerning for metastatic disease or lymphoma. Also noted is abnormal soft tissue mass anterior and posterior  to the right iliac wing concerning for malignancy or metastatic disease. MRI is recommended for further evaluation. 2. Irregular lucency with sclerotic margins is seen involving the right iliac crest concerning for possible lytic lesion. MRI may be performed for  further evaluation. 3. Enlarged fibroid uterus. 4. Small gallstone. 5. Aortic atherosclerosis.   08/22/2020 Pathology Results   FINAL MICROSCOPIC DIAGNOSIS: 08/22/20  A. NEEDLE CORE, RIGHT GLUTEUS MINIMUMUS, BIOPSY:  -  Metastatic carcinoma  -  See comment   COMMENT:   By immunohistochemistry, the neoplastic cells are positive for  cytokeratin-7 and have weak patchy positivity for GATA3 but are negative  for cytokeratin-20 and GCDFP.  The combined morphology and  immunophenotype, support metastasis from the patient's known breast  primary carcinoma.  Dr. Burr Medico was notified of these results on 08/24/2020.  Prognostic markers (ER, PR, Her2, PDL-1) are pending and will be  reported in an addendum   ADDENDUM:   PROGNOSTIC INDICATOR RESULTS:   Immunohistochemical and morphometric analysis performed manually   The tumor cells are NEGATIVE for Her2 (0).   Estrogen Receptor:       POSITIVE, 20%, WEAK STAINING  Progesterone Receptor:   NEGATIVE    PD-L1 (0) negative    09/04/2020 - 09/22/2020 Radiation Therapy   Palliative radiation to right iliac 09/04/20 - 09/22/20, Dr. Lisbeth Renshaw   09/26/2020 -  Chemotherapy   First line weekly Taxol starting week of 09/26/20          CURRENT THERAPY:  first line weekly Taxol starting week of 09/26/20  INTERVAL HISTORY:  Kristin Ward is here for a follow up. She presents to the clinic with her daughter. She notes due to allergic reaction she was placed on long acting oxycodone. Her pain is controlled. She notes she is able to walk adequately. She notes heart fluttering during infusion. Nurse did not check her heart rate. Her daughter notes she will occasionally get in deep depressive state. She notes a dull ache in her back or hip.   REVIEW OF SYSTEMS:   Constitutional: Denies fevers, chills or abnormal weight loss Eyes: Denies blurriness of vision Ears, nose, mouth, throat, and face: Denies mucositis or sore throat Respiratory: Denies  cough, dyspnea or wheezes Cardiovascular: Denies palpitation, chest discomfort or lower extremity swelling Gastrointestinal:  Denies nausea, heartburn or change in bowel habits Skin: Denies abnormal skin rashes Lymphatics: Denies new lymphadenopathy or easy bruising Neurological:Denies numbness, tingling or new weaknesses Behavioral/Psych: Mood is stable, no new changes  All other systems were reviewed with the patient and are negative.  MEDICAL HISTORY:  Past Medical History:  Diagnosis Date  . Abnormal x-ray of lungs with single pulmonary nodule 03/15/2016   per records from [place]   . Anemia   . Asthma   . Cholelithiasis 05/19/2017   On CT  . Coronary artery calcification seen on CAT scan 05/19/2017  . Diabetes mellitus without complication ([place])    type 2  . Dilated idiopathic cardiomyopathy ([place]) 12/2015   EF 15-20%. Diagnosed in [facility]  . Headache    NONE RECENT  . Hypertension   . Lymph nodes enlarged 07/25/2020  . Malignant neoplasm of lower-outer quadrant of left breast of female, estrogen receptor negative ([place]) 06/11/2017   left breast  . Mixed hyperlipidemia 06/17/2018  . partially blind   . Personal history of chemotherapy 2018-2019  . Personal history of radiation therapy 2019  . Retinitis pigmentosa   . Retroperitoneal lymphadenopathy 07/25/2020  . Uveitis  SURGICAL HISTORY: Past Surgical History:  Procedure Laterality Date  . brain cyst removed  2007   to help with headaches per patient , aspirated   . BREAST BIOPSY Left 06/06/2017   x2  . BREAST CYST ASPIRATION Left 06/06/2017  . BREAST LUMPECTOMY Left 06/26/2017  . BREAST LUMPECTOMY WITH RADIOACTIVE SEED AND SENTINEL LYMPH NODE BIOPSY Left 06/26/2017   Procedure: BREAST LUMPECTOMY WITH RADIOACTIVE SEED AND SENTINEL LYMPH NODE BIOPSY ERAS  PATHWAY;  Surgeon: [name], MD;  Location: [place];  Service: General;  Laterality: Left;  pec block  . FINE NEEDLE ASPIRATION Left  02/23/2019   Procedure: FINE NEEDLE ASPIRATION;  Surgeon: [name], MD;  Location: [place];  Service: General;  Laterality: Left;  Asiration of left breast seroma   . NASAL TURBINATE REDUCTION    . PORT-A-CATH REMOVAL Right 02/23/2019   Procedure: REMOVAL PORT-A-CATH;  Surgeon: [name], MD;  Location: [place];  Service: General;  Laterality: Right;  . PORTACATH PLACEMENT N/A 06/26/2017   Procedure: INSERTION PORT-A-CATH;  Surgeon: [name], MD;  Location: [place];  Service: General;  Laterality: N/A;  . TRANSTHORACIC ECHOCARDIOGRAM  12/2015   A) [facility] May 2017: Mild concentric LVH. Global hypokinesis. GR daily. EF 18% severe LA dilation. Mitral annular dilatation with papillary muscle dysfunction and moderate MR. Dilated IVC consistent with elevated RAP.  [facility] TRANSTHORACIC ECHOCARDIOGRAM  05/2017; 08/2017   a) Mild concentric LVH.  EF 45 to 50% with diffuse hypokinesis.  GR 1 DD.  Mild aortic root dilation.;; b)  Mild LVH EF 45 to 50%.  Diffuse HK.  No significant valve disease.  No significant change.    I have reviewed the social history and family history with the patient and they are unchanged from previous note.  ALLERGIES:  is allergic to morphine and related, latex, tylenol with codeine #3 [acetaminophen-codeine], and penicillins.  MEDICATIONS:  Current Outpatient Medications  Medication Sig Dispense Refill  . aspirin 81 MG chewable tablet Chew 81 mg by mouth daily.    . carvedilol (COREG) 12.5 MG tablet Take 1 tablet (12.5 mg total) by mouth 2 (two) times daily with a meal. 180 tablet 1  . Cholecalciferol (D3-1000 PO) Take by mouth.    . diclofenac (VOLTAREN) 75 MG EC tablet Take 1 tablet (75 mg total) by mouth 2 (two) times daily. 60 tablet 1  . furosemide (LASIX) 20 MG tablet Take 2 tablets (40 mg total) by mouth as needed. FOR WEIGHT GAIN  3 TO 4 LBS. (Patient not taking: Reported on 08/30/2020) 90 tablet 3  . glucose  blood (ACCU-CHEK GUIDE) test strip Use as instructed to test blood sugar 2 times a day 100 strip 2  . ibuprofen (ADVIL) 800 MG tablet Take 1 tablet (800 mg total) by mouth every 8 (eight) hours as needed. 30 tablet 2  . linaclotide (LINZESS) 72 MCG capsule Take 1 capsule (72 mcg total) by mouth daily before breakfast. 30 capsule 1  . Multiple Vitamins-Minerals (WOMENS MULTI PO) Take by mouth.  (Patient not taking: Reported on 08/30/2020)    . ondansetron (ZOFRAN) 8 MG tablet Take 1 tablet (8 mg total) by mouth every 8 (eight) hours as needed for nausea or vomiting. 20 tablet 0  . ondansetron (ZOFRAN) 8 MG tablet Take 1 tablet (8 mg total) by mouth 2 (two) times daily as needed (Nausea or vomiting). 30 tablet 1  . oxyCODONE (OXY IR/ROXICODONE) 5 MG immediate release tablet Take 1 tablet (5  mg total) by mouth every 6 (six) hours as needed for severe pain. 60 tablet 0  . oxyCODONE-acetaminophen (PERCOCET) 5-325 MG tablet Take as needed twice a day for severe pain, or before scan 20 tablet 0  . prochlorperazine (COMPAZINE) 10 MG tablet Take 1 tablet (10 mg total) by mouth every 6 (six) hours as needed (Nausea or vomiting). 30 tablet 1  . Saccharomyces boulardii (PROBIOTIC) 250 MG CAPS Take by mouth.    . sacubitril-valsartan (ENTRESTO) 97-103 MG Take 1 tablet by mouth 2 (two) times daily. (Patient not taking: Reported on 08/30/2020) 180 tablet 3  . Semaglutide (RYBELSUS) 3 MG TABS Take 3 mg by mouth daily with breakfast. (Patient not taking: Reported on 08/30/2020) 30 tablet 3  . spironolactone (ALDACTONE) 25 MG tablet Take 1 tablet (25 mg total) by mouth daily. 90 tablet 3  . vitamin B-12 (CYANOCOBALAMIN) 500 MCG tablet Take 500 mcg by mouth daily. (Patient not taking: Reported on 08/30/2020)    . vitamin C (ASCORBIC ACID) 500 MG tablet Take 1,000 mg 2 (two) times daily by mouth.     No current facility-administered medications for this visit.    PHYSICAL EXAMINATION: ECOG PERFORMANCE STATUS: 2 -  Symptomatic, <50% confined to bed  Vitals:   10/09/20 1157  BP: 113/83  Pulse: 95  Resp: 15  Temp: 97.8 F (36.6 C)  SpO2: 98%   Filed Weights   10/09/20 1157  Weight: 214 lb 6.4 oz (97.3 kg)    Due to COVID19 we will limit examination to appearance. Patient had no complaints.  GENERAL:alert, no distress and comfortable SKIN: skin color normal, no rashes or significant lesions EYES: normal, Conjunctiva are pink and non-injected, sclera clear  NEURO: alert & oriented x 3 with fluent speech   LABORATORY DATA:  I have reviewed the data as listed CBC Latest Ref Rng & Units 10/09/2020 10/03/2020 09/26/2020  WBC 4.0 - 10.5 K/uL 1.5(L) 2.9(L) 4.2  Hemoglobin 12.0 - 15.0 g/dL 10.2(L) 10.2(L) 10.4(L)  Hematocrit 36.0 - 46.0 % 30.5(L) 30.9(L) 31.9(L)  Platelets 150 - 400 K/uL 392 334 250     CMP Latest Ref Rng & Units 10/09/2020 10/03/2020 09/26/2020  Glucose 70 - 99 mg/dL 133(H) 108(H) 150(H)  BUN 8 - 23 mg/dL _0 Creatinine 0.44 - 1.00 mg/dL 0.85 0.78 0.88  Sodium 135 - 145 mmol/L 139 137 139  Potassium 3.5 - 5.1 mmol/L 4.1 4.2 3.8  Chloride 98 - 111 mmol/L 107 109 108  CO2 22 - 32 mmol/L _1 Calcium 8.9 - 10.3 mg/dL 9.6 9.0 9.4  Total Protein 6.5 - 8.1 g/dL 7.8 7.4 7.7  Total Bilirubin 0.3 - 1.2 mg/dL 0.6 0.4 0.5  Alkaline Phos 38 - 126 U/L 85 78 85  AST 15 - 41 U/L _2 ALT 0 - 44 U/L _3 RADIOGRAPHIC STUDIES: I have personally reviewed the radiological images as listed and agreed with the findings in the report. No results found.   ASSESSMENT & PLAN:  Kristin Ward is a 65 y.o. female with   1. Left breast invasive ductal carcinoma, stage IB, p(T1cN0M0), Triple negative, Grade 3, in 2018, metastatic disease to right gluteal mass/iliac and diffuse node metastasis in 08/2020 ER 20% weakly + PR/HER2 (0) negative, PD-L1 0% -She was initially diagnosed in 05/2017.She has high risk for recurrence due to triple negative disease. She is s/p left  breast lumpectomy, adjuvant chemo TC and Radiation. -  Genetictesting was negative for pathogenetic mutations. -Due to recent right gluteal pain, she underwent biopsy on 09/02/20 which showed metastatic carcinoma, consistent with breast primary. ER 20% positive, ER and HER2 negative.  -per her daughter's request, I personally reviewed her PET images from 09/13/20 with her daughter today.   -I discussed with stage IV metastatic breast cancer, her cancer is no longer curable but still treatable. Goal of therapy is palliative to control her disease and prolong her life.  -I started her on first-line chemotherapy with weekly Taxol on 09/26/20. I discussed if she does well on chemo for 4-6 months, we can change treatment to antiestrogen therapy without chemo, given her ER weekly positive diease -her tumor was negative for PD-L1, she is not a candidate for immunotherapy -She is tolerating Taxol well so far. Labs reviewed, WBC 1.5, Hg 10.2, BG 113. I discussed using GCSF injection to improve her blood counts. She is agreeable.  -She will proceed with PAC placement on 10/13/20.  -f/u in 2 weeks before cycle 2 chemo   2.Intractable right hip pain -She notes she had recurrent right hip pain since 2020. This now radiates down her right hip and buttock pain. This has caused right leg Weakness -Her right hip pain is caused bythe large infiltrative mass around the right iliac wing, Secondary to metastatic disease.  -She completed Palliative radiation to right iliac 09/04/20 - 09/22/20 with Dr. Lisbeth Renshaw, and her pain is much improved  -Her hip and back pain is controlled on long acting oxycodone .   3. HTN,DM,COPD, retinitis pigmentosa with limited vision,Chronic combined systolic and diastolic heart failure, EF 45-50% -Continue to follow-up withPCP Dr Forest Becker cardiologistDr Ellyn Hack -She is legally blind  4. Anemia -She developed anemia during adjuvant chemotherapy, which resolved after she completed  chemo -She had recurrent mild anemia with Hgb 11.7 in 01/2019.Labs on 04/02/2019 showed normal iron, ferritin, folate, and MMA -Remains mild and intermittent.  5. Goal of care discussion  -We again discussed the incurable nature of her cancer, and the overall poor prognosis, especially if she does not have good response to chemotherapy or progress on chemo -The patient understands the goal of care is palliative. -she is full code now   6. Financial and social support  -She notes she was getting bills from [place]. I discussed getting more help with our financial advocate.  -She has had episode of deep depression since her metastatic cancer diagnosis. I encouraged her to f/u with SW for counseling. She is interested.    Plan -Lab reviewed, ANC 0.9,  adequate to proceed with D15 Taxol today, with filgrastim on 3/4 -Lab, flush, Taxol in 2, 3, 4 weeks  -F/u in 2 and 4 weeks.    No problem-specific Assessment & Plan notes found for this encounter.   No orders of the defined types were placed in this encounter.  All questions were answered. The patient knows to call the clinic with any problems, questions or concerns. No barriers to learning was detected. The total time spent in the appointment was 30 minutes.     [name], MD 10/09/2020   I, Joslyn Devon, am acting as scribe for [name], MD.   I have reviewed the above documentation for accuracy and completeness, and I agree with the above.

## 2020-10-09 ENCOUNTER — Other Ambulatory Visit: Payer: Self-pay

## 2020-10-09 ENCOUNTER — Ambulatory Visit: Payer: Medicare HMO | Admitting: Family Medicine

## 2020-10-09 ENCOUNTER — Encounter: Payer: Self-pay | Admitting: Hematology

## 2020-10-09 ENCOUNTER — Inpatient Hospital Stay: Payer: Medicare Other

## 2020-10-09 ENCOUNTER — Inpatient Hospital Stay (HOSPITAL_BASED_OUTPATIENT_CLINIC_OR_DEPARTMENT_OTHER): Payer: Medicare Other | Admitting: Hematology

## 2020-10-09 VITALS — BP 113/83 | HR 95 | Temp 97.8°F | Resp 15 | Ht 67.0 in | Wt 214.4 lb

## 2020-10-09 DIAGNOSIS — Z7189 Other specified counseling: Secondary | ICD-10-CM

## 2020-10-09 DIAGNOSIS — C50512 Malignant neoplasm of lower-outer quadrant of left female breast: Secondary | ICD-10-CM | POA: Diagnosis not present

## 2020-10-09 DIAGNOSIS — I110 Hypertensive heart disease with heart failure: Secondary | ICD-10-CM | POA: Diagnosis not present

## 2020-10-09 DIAGNOSIS — E119 Type 2 diabetes mellitus without complications: Secondary | ICD-10-CM | POA: Diagnosis not present

## 2020-10-09 DIAGNOSIS — G893 Neoplasm related pain (acute) (chronic): Secondary | ICD-10-CM | POA: Diagnosis not present

## 2020-10-09 DIAGNOSIS — I5042 Chronic combined systolic (congestive) and diastolic (congestive) heart failure: Secondary | ICD-10-CM | POA: Diagnosis not present

## 2020-10-09 DIAGNOSIS — Z171 Estrogen receptor negative status [ER-]: Secondary | ICD-10-CM

## 2020-10-09 DIAGNOSIS — J449 Chronic obstructive pulmonary disease, unspecified: Secondary | ICD-10-CM | POA: Diagnosis not present

## 2020-10-09 DIAGNOSIS — I10 Essential (primary) hypertension: Secondary | ICD-10-CM | POA: Diagnosis not present

## 2020-10-09 DIAGNOSIS — Z5111 Encounter for antineoplastic chemotherapy: Secondary | ICD-10-CM | POA: Diagnosis not present

## 2020-10-09 DIAGNOSIS — C7989 Secondary malignant neoplasm of other specified sites: Secondary | ICD-10-CM | POA: Diagnosis not present

## 2020-10-09 DIAGNOSIS — D649 Anemia, unspecified: Secondary | ICD-10-CM | POA: Diagnosis not present

## 2020-10-09 DIAGNOSIS — H3552 Pigmentary retinal dystrophy: Secondary | ICD-10-CM | POA: Diagnosis not present

## 2020-10-09 LAB — COMPREHENSIVE METABOLIC PANEL
ALT: 25 U/L (ref 0–44)
AST: 24 U/L (ref 15–41)
Albumin: 4.2 g/dL (ref 3.5–5.0)
Alkaline Phosphatase: 85 U/L (ref 38–126)
Anion gap: 9 (ref 5–15)
BUN: 13 mg/dL (ref 8–23)
CO2: 23 mmol/L (ref 22–32)
Calcium: 9.6 mg/dL (ref 8.9–10.3)
Chloride: 107 mmol/L (ref 98–111)
Creatinine, Ser: 0.85 mg/dL (ref 0.44–1.00)
GFR, Estimated: >60 mL/min (ref >60)
Glucose, Bld: 133 mg/dL — ABNORMAL HIGH (ref 70–99)
Potassium: 4.1 mmol/L (ref 3.5–5.1)
Sodium: 139 mmol/L (ref 135–145)
Total Bilirubin: 0.6 mg/dL (ref 0.3–1.2)
Total Protein: 7.8 g/dL (ref 6.5–8.1)

## 2020-10-09 LAB — CBC WITH DIFFERENTIAL/PLATELET
Abs Immature Granulocytes: 0.01 10*3/uL (ref 0.00–0.07)
Basophils Absolute: 0 10*3/uL (ref 0.0–0.1)
Basophils Relative: 1 %
Eosinophils Absolute: 0.1 10*3/uL (ref 0.0–0.5)
Eosinophils Relative: 6 %
HCT: 30.5 % — ABNORMAL LOW (ref 36.0–46.0)
Hemoglobin: 10.2 g/dL — ABNORMAL LOW (ref 12.0–15.0)
Immature Granulocytes: 1 %
Lymphocytes Relative: 28 %
Lymphs Abs: 0.4 10*3/uL — ABNORMAL LOW (ref 0.7–4.0)
MCH: 30 pg (ref 26.0–34.0)
MCHC: 33.4 g/dL (ref 30.0–36.0)
MCV: 89.7 fL (ref 80.0–100.0)
Monocytes Absolute: 0.1 10*3/uL (ref 0.1–1.0)
Monocytes Relative: 6 %
Neutro Abs: 0.9 10*3/uL — ABNORMAL LOW (ref 1.7–7.7)
Neutrophils Relative %: 58 %
Platelets: 392 10*3/uL (ref 150–400)
RBC: 3.4 MIL/uL — ABNORMAL LOW (ref 3.87–5.11)
RDW: 15.6 % — ABNORMAL HIGH (ref 11.5–15.5)
WBC: 1.5 10*3/uL — ABNORMAL LOW (ref 4.0–10.5)
nRBC: 0 % (ref 0.0–0.2)

## 2020-10-09 MED ORDER — DIPHENHYDRAMINE HCL 50 MG/ML IJ SOLN
25.0000 mg | Freq: Once | INTRAMUSCULAR | Status: AC
Start: 2020-10-09 — End: 2020-10-09
  Administered 2020-10-09: 25 mg via INTRAVENOUS

## 2020-10-09 MED ORDER — SODIUM CHLORIDE 0.9 % IV SOLN
Freq: Once | INTRAVENOUS | Status: AC
  Filled 2020-10-09: qty 250

## 2020-10-09 MED ORDER — FAMOTIDINE IN NACL 20-0.9 MG/50ML-% IV SOLN
20.0000 mg | Freq: Once | INTRAVENOUS | Status: AC
  Administered 2020-10-09: 20 mg via INTRAVENOUS

## 2020-10-09 MED ORDER — FAMOTIDINE IN NACL 20-0.9 MG/50ML-% IV SOLN
INTRAVENOUS | Status: AC
  Filled 2020-10-09: qty 50

## 2020-10-09 MED ORDER — SODIUM CHLORIDE 0.9 % IV SOLN
10.0000 mg | Freq: Once | INTRAVENOUS | Status: AC
  Administered 2020-10-09: 10 mg via INTRAVENOUS
  Filled 2020-10-09: qty 10

## 2020-10-09 MED ORDER — DIPHENHYDRAMINE HCL 50 MG/ML IJ SOLN
INTRAMUSCULAR | Status: AC
  Filled 2020-10-09: qty 1

## 2020-10-09 MED ORDER — SODIUM CHLORIDE 0.9 % IV SOLN
80.0000 mg/m2 | Freq: Once | INTRAVENOUS | Status: AC
  Administered 2020-10-09: 174 mg via INTRAVENOUS
  Filled 2020-10-09: qty 29

## 2020-10-09 NOTE — Progress Notes (Signed)
ANC 0.9, ok to treat per MD.

## 2020-10-09 NOTE — Patient Instructions (Signed)
[place] Discharge Instructions for Patients Receiving Chemotherapy  Today you received the following chemotherapy agents Paclitaxel (Taxane)  To help prevent nausea and vomiting after your treatment, we encourage you to take your nausea medication as directed.  If you develop nausea and vomiting that is not controlled by your nausea medication, call the clinic.   BELOW ARE SYMPTOMS THAT SHOULD BE REPORTED IMMEDIATELY:  *FEVER GREATER THAN 100.5 F  *CHILLS WITH OR WITHOUT FEVER  NAUSEA AND VOMITING THAT IS NOT CONTROLLED WITH YOUR NAUSEA MEDICATION  *UNUSUAL SHORTNESS OF BREATH  *UNUSUAL BRUISING OR BLEEDING  TENDERNESS IN MOUTH AND THROAT WITH OR WITHOUT PRESENCE OF ULCERS  *URINARY PROBLEMS  *BOWEL PROBLEMS  UNUSUAL RASH Items with * indicate a potential emergency and should be followed up as soon as possible.  Feel free to call the clinic should you have any questions or concerns. The clinic phone number is [phone].  Please show the [place] at check-in to the Emergency Department and triage nurse.  Paclitaxel injection What is this medicine? PACLITAXEL (PAK li TAX el) is a chemotherapy drug. It targets fast dividing cells, like cancer cells, and causes these cells to die. This medicine is used to treat ovarian cancer, breast cancer, lung cancer, Kaposi's sarcoma, and other cancers. This medicine may be used for other purposes; ask your health care provider or pharmacist if you have questions. COMMON BRAND NAME(S): Onxol, Taxol What should I tell my health care provider before I take this medicine? They need to know if you have any of these conditions:  history of irregular heartbeat  liver disease  low blood counts, like low white cell, platelet, or red cell counts  lung or breathing disease, like asthma  tingling of the fingers or toes, or other nerve disorder  an unusual or allergic reaction to paclitaxel, alcohol,  polyoxyethylated castor oil, other chemotherapy, other medicines, foods, dyes, or preservatives  pregnant or trying to get pregnant  breast-feeding How should I use this medicine? This drug is given as an infusion into a vein. It is administered in a hospital or clinic by a specially trained health care professional. Talk to your pediatrician regarding the use of this medicine in children. Special care may be needed. Overdosage: If you think you have taken too much of this medicine contact a poison control center or emergency room at once. NOTE: This medicine is only for you. Do not share this medicine with others. What if I miss a dose? It is important not to miss your dose. Call your doctor or health care professional if you are unable to keep an appointment. What may interact with this medicine? Do not take this medicine with any of the following medications:  live virus vaccines This medicine may also interact with the following medications:  antiviral medicines for hepatitis, HIV or AIDS  certain antibiotics like erythromycin and clarithromycin  certain medicines for fungal infections like ketoconazole and itraconazole  certain medicines for seizures like carbamazepine, phenobarbital, phenytoin  gemfibrozil  nefazodone  rifampin  St. John's wort This list may not describe all possible interactions. Give your health care provider a list of all the medicines, herbs, non-prescription drugs, or dietary supplements you use. Also tell them if you smoke, drink alcohol, or use illegal drugs. Some items may interact with your medicine. What should I watch for while using this medicine? Your condition will be monitored carefully while you are receiving this medicine. You will need important blood work  done while you are taking this medicine. This medicine can cause serious allergic reactions. To reduce your risk you will need to take other medicine(s) before treatment with this  medicine. If you experience allergic reactions like skin rash, itching or hives, swelling of the face, lips, or tongue, tell your doctor or health care professional right away. In some cases, you may be given additional medicines to help with side effects. Follow all directions for their use. This drug may make you feel generally unwell. This is not uncommon, as chemotherapy can affect healthy cells as well as cancer cells. Report any side effects. Continue your course of treatment even though you feel ill unless your doctor tells you to stop. Call your doctor or health care professional for advice if you get a fever, chills or sore throat, or other symptoms of a cold or flu. Do not treat yourself. This drug decreases your body's ability to fight infections. Try to avoid being around people who are sick. This medicine may increase your risk to bruise or bleed. Call your doctor or health care professional if you notice any unusual bleeding. Be careful brushing and flossing your teeth or using a toothpick because you may get an infection or bleed more easily. If you have any dental work done, tell your dentist you are receiving this medicine. Avoid taking products that contain aspirin, acetaminophen, ibuprofen, naproxen, or ketoprofen unless instructed by your doctor. These medicines may hide a fever. Do not become pregnant while taking this medicine. Women should inform their doctor if they wish to become pregnant or think they might be pregnant. There is a potential for serious side effects to an unborn child. Talk to your health care professional or pharmacist for more information. Do not breast-feed an infant while taking this medicine. Men are advised not to father a child while receiving this medicine. This product may contain alcohol. Ask your pharmacist or healthcare provider if this medicine contains alcohol. Be sure to tell all healthcare providers you are taking this medicine. Certain medicines,  like metronidazole and disulfiram, can cause an unpleasant reaction when taken with alcohol. The reaction includes flushing, headache, nausea, vomiting, sweating, and increased thirst. The reaction can last from 30 minutes to several hours. What side effects may I notice from receiving this medicine? Side effects that you should report to your doctor or health care professional as soon as possible:  allergic reactions like skin rash, itching or hives, swelling of the face, lips, or tongue  breathing problems  changes in vision  fast, irregular heartbeat  high or low blood pressure  mouth sores  pain, tingling, numbness in the hands or feet  signs of decreased platelets or bleeding - bruising, pinpoint red spots on the skin, black, tarry stools, blood in the urine  signs of decreased red blood cells - unusually weak or tired, feeling faint or lightheaded, falls  signs of infection - fever or chills, cough, sore throat, pain or difficulty passing urine  signs and symptoms of liver injury like dark yellow or brown urine; general ill feeling or flu-like symptoms; light-colored stools; loss of appetite; nausea; right upper belly pain; unusually weak or tired; yellowing of the eyes or skin  swelling of the ankles, feet, hands  unusually slow heartbeat Side effects that usually do not require medical attention (report to your doctor or health care professional if they continue or are bothersome):  diarrhea  hair loss  loss of appetite  muscle or joint pain  nausea, vomiting  pain, redness, or irritation at site where injected  tiredness This list may not describe all possible side effects. Call your doctor for medical advice about side effects. You may report side effects to FDA at 1-800-FDA-1088. Where should I keep my medicine? This drug is given in a hospital or clinic and will not be stored at home. NOTE: This sheet is a summary. It may not cover all possible information.  If you have questions about this medicine, talk to your doctor, pharmacist, or health care provider.  2021 Elsevier/Gold Standard (2019-06-30 13:37:23)

## 2020-10-10 ENCOUNTER — Telehealth: Payer: Self-pay | Admitting: Family Medicine

## 2020-10-10 ENCOUNTER — Telehealth: Payer: Self-pay | Admitting: Hematology

## 2020-10-10 NOTE — Telephone Encounter (Signed)
Scheduled follow-up appointments per 2/28 los. Patient is aware.

## 2020-10-10 NOTE — Telephone Encounter (Signed)
Ok to reschedule her?  ?

## 2020-10-10 NOTE — Telephone Encounter (Signed)
Pt called and she wants to know if she needs to come in thursday for her wellness visit, states her white blood count is very low and she is going Friday to have a port put in Friday and she is worried she is going to get sick by coming out but she wants your opinion first, also states she did not get the covid vaccine Pt can be reached at  [phone]

## 2020-10-11 ENCOUNTER — Other Ambulatory Visit: Payer: Medicare Other

## 2020-10-11 ENCOUNTER — Ambulatory Visit: Payer: Medicare Other | Admitting: Hematology

## 2020-10-11 NOTE — Telephone Encounter (Signed)
done

## 2020-10-12 ENCOUNTER — Other Ambulatory Visit: Payer: Self-pay | Admitting: Student

## 2020-10-12 ENCOUNTER — Ambulatory Visit: Payer: Medicare HMO | Admitting: Family Medicine

## 2020-10-13 ENCOUNTER — Ambulatory Visit (HOSPITAL_COMMUNITY)
Admission: RE | Admit: 2020-10-13 | Discharge: 2020-10-13 | Disposition: A | Discharge status: Home / Self Care | Payer: Medicare Other | Source: Ambulatory Visit | Attending: Hematology | Admitting: Hematology

## 2020-10-13 ENCOUNTER — Encounter (HOSPITAL_COMMUNITY): Payer: Self-pay

## 2020-10-13 ENCOUNTER — Inpatient Hospital Stay: Payer: Medicare Other | Attending: Hematology

## 2020-10-13 ENCOUNTER — Other Ambulatory Visit: Payer: Self-pay

## 2020-10-13 VITALS — BP 138/95 | HR 87 | Resp 16

## 2020-10-13 DIAGNOSIS — Z5111 Encounter for antineoplastic chemotherapy: Secondary | ICD-10-CM | POA: Diagnosis not present

## 2020-10-13 DIAGNOSIS — Z7982 Long term (current) use of aspirin: Secondary | ICD-10-CM | POA: Insufficient documentation

## 2020-10-13 DIAGNOSIS — Z880 Allergy status to penicillin: Secondary | ICD-10-CM | POA: Diagnosis not present

## 2020-10-13 DIAGNOSIS — I5042 Chronic combined systolic (congestive) and diastolic (congestive) heart failure: Secondary | ICD-10-CM | POA: Diagnosis not present

## 2020-10-13 DIAGNOSIS — D649 Anemia, unspecified: Secondary | ICD-10-CM | POA: Insufficient documentation

## 2020-10-13 DIAGNOSIS — I420 Dilated cardiomyopathy: Secondary | ICD-10-CM | POA: Insufficient documentation

## 2020-10-13 DIAGNOSIS — H3552 Pigmentary retinal dystrophy: Secondary | ICD-10-CM | POA: Diagnosis not present

## 2020-10-13 DIAGNOSIS — C50512 Malignant neoplasm of lower-outer quadrant of left female breast: Secondary | ICD-10-CM | POA: Diagnosis not present

## 2020-10-13 DIAGNOSIS — Z79899 Other long term (current) drug therapy: Secondary | ICD-10-CM | POA: Diagnosis not present

## 2020-10-13 DIAGNOSIS — Z452 Encounter for adjustment and management of vascular access device: Secondary | ICD-10-CM | POA: Diagnosis not present

## 2020-10-13 DIAGNOSIS — E119 Type 2 diabetes mellitus without complications: Secondary | ICD-10-CM | POA: Insufficient documentation

## 2020-10-13 DIAGNOSIS — C50912 Malignant neoplasm of unspecified site of left female breast: Secondary | ICD-10-CM | POA: Diagnosis not present

## 2020-10-13 DIAGNOSIS — Z885 Allergy status to narcotic agent status: Secondary | ICD-10-CM | POA: Diagnosis not present

## 2020-10-13 DIAGNOSIS — Z95828 Presence of other vascular implants and grafts: Secondary | ICD-10-CM

## 2020-10-13 DIAGNOSIS — J449 Chronic obstructive pulmonary disease, unspecified: Secondary | ICD-10-CM | POA: Diagnosis not present

## 2020-10-13 DIAGNOSIS — Z171 Estrogen receptor negative status [ER-]: Secondary | ICD-10-CM | POA: Diagnosis not present

## 2020-10-13 DIAGNOSIS — E782 Mixed hyperlipidemia: Secondary | ICD-10-CM | POA: Insufficient documentation

## 2020-10-13 DIAGNOSIS — I110 Hypertensive heart disease with heart failure: Secondary | ICD-10-CM | POA: Diagnosis not present

## 2020-10-13 DIAGNOSIS — Z5189 Encounter for other specified aftercare: Secondary | ICD-10-CM | POA: Insufficient documentation

## 2020-10-13 DIAGNOSIS — C7951 Secondary malignant neoplasm of bone: Secondary | ICD-10-CM | POA: Insufficient documentation

## 2020-10-13 DIAGNOSIS — I10 Essential (primary) hypertension: Secondary | ICD-10-CM | POA: Insufficient documentation

## 2020-10-13 DIAGNOSIS — Z91040 Latex allergy status: Secondary | ICD-10-CM | POA: Insufficient documentation

## 2020-10-13 DIAGNOSIS — C50919 Malignant neoplasm of unspecified site of unspecified female breast: Secondary | ICD-10-CM | POA: Diagnosis not present

## 2020-10-13 HISTORY — PX: IR IMAGING GUIDED PORT INSERTION: IMG5740

## 2020-10-13 LAB — CBC WITH DIFFERENTIAL/PLATELET
Abs Immature Granulocytes: 0.01 10*3/uL (ref 0.00–0.07)
Basophils Absolute: 0 10*3/uL (ref 0.0–0.1)
Basophils Relative: 1 %
Eosinophils Absolute: 0 10*3/uL (ref 0.0–0.5)
Eosinophils Relative: 3 %
HCT: 28.6 % — ABNORMAL LOW (ref 36.0–46.0)
Hemoglobin: 9.3 g/dL — ABNORMAL LOW (ref 12.0–15.0)
Immature Granulocytes: 1 %
Lymphocytes Relative: 35 %
Lymphs Abs: 0.4 10*3/uL — ABNORMAL LOW (ref 0.7–4.0)
MCH: 30.1 pg (ref 26.0–34.0)
MCHC: 32.5 g/dL (ref 30.0–36.0)
MCV: 92.6 fL (ref 80.0–100.0)
Monocytes Absolute: 0 10*3/uL — ABNORMAL LOW (ref 0.1–1.0)
Monocytes Relative: 3 %
Neutro Abs: 0.6 10*3/uL — ABNORMAL LOW (ref 1.7–7.7)
Neutrophils Relative %: 57 %
Platelets: 311 10*3/uL (ref 150–400)
RBC: 3.09 MIL/uL — ABNORMAL LOW (ref 3.87–5.11)
RDW: 15.6 % — ABNORMAL HIGH (ref 11.5–15.5)
WBC: 1 10*3/uL — CL (ref 4.0–10.5)
nRBC: 0 % (ref 0.0–0.2)

## 2020-10-13 LAB — PROTIME-INR
INR: 1.1 (ref 0.8–1.2)
Prothrombin Time: 13.3 seconds (ref 11.4–15.2)

## 2020-10-13 LAB — GLUCOSE, CAPILLARY
Glucose-Capillary: 100 mg/dL — ABNORMAL HIGH (ref 70–99)
Glucose-Capillary: 68 mg/dL — ABNORMAL LOW (ref 70–99)

## 2020-10-13 MED ORDER — LIDOCAINE-EPINEPHRINE 1 %-1:100000 IJ SOLN
INTRAMUSCULAR | Status: AC | PRN
  Administered 2020-10-13 (×2): 10 mL via INTRADERMAL

## 2020-10-13 MED ORDER — FILGRASTIM-SNDZ 480 MCG/0.8ML IJ SOSY
480.0000 ug | PREFILLED_SYRINGE | Freq: Once | INTRAMUSCULAR | Status: AC
  Administered 2020-10-13: 480 ug via SUBCUTANEOUS

## 2020-10-13 MED ORDER — MIDAZOLAM HCL 2 MG/2ML IJ SOLN
INTRAMUSCULAR | Status: AC
  Filled 2020-10-13: qty 4

## 2020-10-13 MED ORDER — HEPARIN SOD (PORK) LOCK FLUSH 100 UNIT/ML IV SOLN
INTRAVENOUS | Status: AC
  Filled 2020-10-13: qty 5

## 2020-10-13 MED ORDER — HEPARIN SOD (PORK) LOCK FLUSH 100 UNIT/ML IV SOLN
INTRAVENOUS | Status: AC | PRN
  Administered 2020-10-13: 500 [IU] via INTRAVENOUS

## 2020-10-13 MED ORDER — FILGRASTIM-SNDZ 480 MCG/0.8ML IJ SOSY
PREFILLED_SYRINGE | INTRAMUSCULAR | Status: AC
  Filled 2020-10-13: qty 0.8

## 2020-10-13 MED ORDER — CLINDAMYCIN PHOSPHATE 900 MG/50ML IV SOLN
INTRAVENOUS | Status: AC
  Administered 2020-10-13: 900 mg
  Filled 2020-10-13: qty 50

## 2020-10-13 MED ORDER — SODIUM CHLORIDE 0.9 % IV SOLN: INTRAVENOUS | Status: DC

## 2020-10-13 MED ORDER — LIDOCAINE-EPINEPHRINE 1 %-1:100000 IJ SOLN
INTRAMUSCULAR | Status: AC
  Filled 2020-10-13: qty 1

## 2020-10-13 MED ORDER — FENTANYL CITRATE (PF) 100 MCG/2ML IJ SOLN
INTRAMUSCULAR | Status: AC
  Filled 2020-10-13: qty 2

## 2020-10-13 MED ORDER — DEXTROSE 50 % IV SOLN
INTRAVENOUS | Status: AC
  Administered 2020-10-13: 25 mL via INTRAVENOUS
  Filled 2020-10-13: qty 50

## 2020-10-13 MED ORDER — MIDAZOLAM HCL 2 MG/2ML IJ SOLN
INTRAMUSCULAR | Status: AC | PRN
  Administered 2020-10-13: 1 mg via INTRAVENOUS
  Administered 2020-10-13: 0.5 mg via INTRAVENOUS
  Administered 2020-10-13 (×2): 1 mg via INTRAVENOUS

## 2020-10-13 MED ORDER — DEXTROSE 50 % IV SOLN: 0.5000 | Freq: Once | INTRAVENOUS | Status: AC

## 2020-10-13 MED ORDER — FENTANYL CITRATE (PF) 100 MCG/2ML IJ SOLN
INTRAMUSCULAR | Status: AC | PRN
  Administered 2020-10-13 (×3): 50 ug via INTRAVENOUS

## 2020-10-13 NOTE — Patient Instructions (Signed)

## 2020-10-13 NOTE — Discharge Instructions (Signed)
Please call Interventional Radiology clinic [phone] with any questions or concerns.  You may remove your dressing and shower tomorrow.  DO NOT use EMLA cream for 2 weeks after port placement as this cream will remove surgical glue on your incision.   Implanted Port Insertion, Care After This sheet gives you information about how to care for yourself after your procedure. Your health care provider may also give you more specific instructions. If you have problems or questions, contact your health care provider. What can I expect after the procedure? After the procedure, it is common to have:  Discomfort at the port insertion site.  Bruising on the skin over the port. This should improve over 3-4 days. Follow these instructions at home: Port care  After your port is placed, you will get a manufacturer's information card. The card has information about your port. Keep this card with you at all times.  Take care of the port as told by your health care provider. Ask your health care provider if you or a family member can get training for taking care of the port at home. A home health care nurse may also take care of the port.  Make sure to remember what type of port you have. Incision care  Follow instructions from your health care provider about how to take care of your port insertion site. Make sure you: ? Wash your hands with soap and water before and after you change your bandage (dressing). If soap and water are not available, use hand sanitizer. ? Change your dressing as told by your health care provider. ? Leave stitches (sutures), skin glue, or adhesive strips in place. These skin closures may need to stay in place for 2 weeks or longer. If adhesive strip edges start to loosen and curl up, you may trim the loose edges. Do not remove adhesive strips completely unless your health care provider tells you to do that.  Check your port insertion site every day for signs of infection.  Check for: ? Redness, swelling, or pain. ? Fluid or blood. ? Warmth. ? Pus or a bad smell.      Activity  Return to your normal activities as told by your health care provider. Ask your health care provider what activities are safe for you.  Do not lift anything that is heavier than 10 lb (4.5 kg), or the limit that you are told, until your health care provider says that it is safe. General instructions  Take over-the-counter and prescription medicines only as told by your health care provider.  Do not take baths, swim, or use a hot tub until your health care provider approves. Ask your health care provider if you may take showers. You may only be allowed to take sponge baths.  Do not drive for 24 hours if you were given a sedative during your procedure.  Wear a medical alert bracelet in case of an emergency. This will tell any health care providers that you have a port.  Keep all follow-up visits as told by your health care provider. This is important. Contact a health care provider if:  You cannot flush your port with saline as directed, or you cannot draw blood from the port.  You have a fever or chills.  You have redness, swelling, or pain around your port insertion site.  You have fluid or blood coming from your port insertion site.  Your port insertion site feels warm to the touch.  You have pus or a   bad smell coming from the port insertion site. Get help right away if:  You have chest pain or shortness of breath.  You have bleeding from your port that you cannot control. Summary  Take care of the port as told by your health care provider. Keep the manufacturer's information card with you at all times.  Change your dressing as told by your health care provider.  Contact a health care provider if you have a fever or chills or if you have redness, swelling, or pain around your port insertion site.  Keep all follow-up visits as told by your health care  provider. This information is not intended to replace advice given to you by your health care provider. Make sure you discuss any questions you have with your health care provider. Document Revised: 02/24/2018 Document Reviewed: 02/24/2018 Elsevier Patient Education  2021 Elsevier Inc.    Moderate Conscious Sedation, Adult, Care After This sheet gives you information about how to care for yourself after your procedure. Your health care provider may also give you more specific instructions. If you have problems or questions, contact your health care provider. What can I expect after the procedure? After the procedure, it is common to have:  Sleepiness for several hours.  Impaired judgment for several hours.  Difficulty with balance.  Vomiting if you eat too soon. Follow these instructions at home: For the time period you were told by your health care provider:  Rest.  Do not participate in activities where you could fall or become injured.  Do not drive or use machinery.  Do not drink alcohol.  Do not take sleeping pills or medicines that cause drowsiness.  Do not make important decisions or sign legal documents.  Do not take care of children on your own.      Eating and drinking  Follow the diet recommended by your health care provider.  Drink enough fluid to keep your urine pale yellow.  If you vomit: ? Drink water, juice, or soup when you can drink without vomiting. ? Make sure you have little or no nausea before eating solid foods.   General instructions  Take over-the-counter and prescription medicines only as told by your health care provider.  Have a responsible adult stay with you for the time you are told. It is important to have someone help care for you until you are awake and alert.  Do not smoke.  Keep all follow-up visits as told by your health care provider. This is important. Contact a health care provider if:  You are still sleepy or having  trouble with balance after 24 hours.  You feel light-headed.  You keep feeling nauseous or you keep vomiting.  You develop a rash.  You have a fever.  You have redness or swelling around the IV site. Get help right away if:  You have trouble breathing.  You have new-onset confusion at home. Summary  After the procedure, it is common to feel sleepy, have impaired judgment, or feel nauseous if you eat too soon.  Rest after you get home. Know the things you should not do after the procedure.  Follow the diet recommended by your health care provider and drink enough fluid to keep your urine pale yellow.  Get help right away if you have trouble breathing or new-onset confusion at home. This information is not intended to replace advice given to you by your health care provider. Make sure you discuss any questions you have with your health   care provider. Document Revised: 11/26/2019 Document Reviewed: 06/24/2019 Elsevier Patient Education  2021 Elsevier Inc.  

## 2020-10-13 NOTE — Procedures (Signed)
Interventional Radiology Procedure Note  Indication: Breast Ca  Procedure: Single Lumen Power Port Placement    Access:  Right internal jugular vein  Findings: Catheter tip positioned at cavoatrial junction. Port is ready for immediate use.   Complications: None  EBL: < 10 mL  Miachel Roux, MD [phone]

## 2020-10-13 NOTE — H&P (Signed)
Referring Physician(s): Feng,Yan  Supervising Physician: Mir, Sharen Heck  Patient Status:  [facility] OP  Chief Complaint: "I'm here to get a port a cath"   Subjective: Patient familiar to IR service from right gluteal soft tissue mass biopsy on 08/22/2020 (positive for breast ca met).  She has a history of metastatic breast carcinoma with initial diagnosis in left breast in 2018 with prior lumpectomy, radioactive seed implantation and chemoradiation.  She presents again today for Port-A-Cath placement for palliative chemotherapy. She is legally blind.  She currently denies fever, headache, chest pain, dyspnea, cough, abdominal pain, back pain, nausea, vomiting or bleeding.  Does have some occasional palpitations.  Past Medical History:  Diagnosis Date  . Abnormal x-ray of lungs with single pulmonary nodule 03/15/2016   per records from [place]   . Anemia   . Asthma   . Cholelithiasis 05/19/2017   On CT  . Coronary artery calcification seen on CAT scan 05/19/2017  . Diabetes mellitus without complication ([place])    type 2  . Dilated idiopathic cardiomyopathy ([place]) 12/2015   EF 15-20%. Diagnosed in [facility]  . Headache    NONE RECENT  . Hypertension   . Lymph nodes enlarged 07/25/2020  . Malignant neoplasm of lower-outer quadrant of left breast of female, estrogen receptor negative ([place]) 06/11/2017   left breast  . Mixed hyperlipidemia 06/17/2018  . partially blind   . Personal history of chemotherapy 2018-2019  . Personal history of radiation therapy 2019  . Retinitis pigmentosa   . Retroperitoneal lymphadenopathy 07/25/2020  . Uveitis    Past Surgical History:  Procedure Laterality Date  . brain cyst removed  2007   to help with headaches per patient , aspirated   . BREAST BIOPSY Left 06/06/2017   x2  . BREAST CYST ASPIRATION Left 06/06/2017  . BREAST LUMPECTOMY Left 06/26/2017  . BREAST LUMPECTOMY WITH RADIOACTIVE SEED AND SENTINEL LYMPH NODE BIOPSY Left  06/26/2017   Procedure: BREAST LUMPECTOMY WITH RADIOACTIVE SEED AND SENTINEL LYMPH NODE BIOPSY ERAS  PATHWAY;  Surgeon: [name], MD;  Location: [facility];  Service: General;  Laterality: Left;  pec block  . FINE NEEDLE ASPIRATION Left 02/23/2019   Procedure: FINE NEEDLE ASPIRATION;  Surgeon: [name], MD;  Location: [facility];  Service: General;  Laterality: Left;  Asiration of left breast seroma   . NASAL TURBINATE REDUCTION    . PORT-A-CATH REMOVAL Right 02/23/2019   Procedure: REMOVAL PORT-A-CATH;  Surgeon: [name], MD;  Location: [facility];  Service: General;  Laterality: Right;  . PORTACATH PLACEMENT N/A 06/26/2017   Procedure: INSERTION PORT-A-CATH;  Surgeon: [name], MD;  Location: [facility];  Service: General;  Laterality: N/A;  . TRANSTHORACIC ECHOCARDIOGRAM  12/2015   A) [facility] May 2017: Mild concentric LVH. Global hypokinesis. GR daily. EF 18% severe LA dilation. Mitral annular dilatation with papillary muscle dysfunction and moderate MR. Dilated IVC consistent with elevated RAP.  [facility] TRANSTHORACIC ECHOCARDIOGRAM  05/2017; 08/2017   a) Mild concentric LVH.  EF 45 to 50% with diffuse hypokinesis.  GR 1 DD.  Mild aortic root dilation.;; b)  Mild LVH EF 45 to 50%.  Diffuse HK.  No significant valve disease.  No significant change.      Allergies: Morphine and related, Latex, Tylenol with codeine #3 [acetaminophen-codeine], and Penicillins  Medications: Prior to Admission medications   Medication Sig Start Date End Date Taking? Authorizing Provider  aspirin 81 MG chewable tablet Chew 81 mg by  mouth daily.   Yes [provider]  carvedilol (COREG) 12.5 MG tablet Take 1 tablet (12.5 mg total) by mouth 2 (two) times daily with a meal. 11/19/19  Yes [name], MD  Cholecalciferol (D3-1000 PO) Take by mouth.   Yes [provider]  glucose blood (ACCU-CHEK GUIDE) test strip Use as instructed to test blood sugar  2 times a day 08/17/20  Yes Shamleffer, Melanie Crazier, MD  ibuprofen (ADVIL) 800 MG tablet Take 1 tablet (800 mg total) by mouth every 8 (eight) hours as needed. 08/01/20  Yes Leandrew Koyanagi, MD  Multiple Vitamins-Minerals (WOMENS MULTI PO) Take by mouth.   Yes [provider]  oxyCODONE (OXY IR/ROXICODONE) 5 MG immediate release tablet Take 1 tablet (5 mg total) by mouth every 6 (six) hours as needed for severe pain. 08/22/20  Yes [name], MD  oxyCODONE-acetaminophen (PERCOCET) 5-325 MG tablet Take as needed twice a day for severe pain, or before scan 08/16/20  Yes [name], MD  sacubitril-valsartan (ENTRESTO) 97-103 MG Take 1 tablet by mouth 2 (two) times daily. 11/19/19  Yes [name], MD  spironolactone (ALDACTONE) 25 MG tablet Take 1 tablet (25 mg total) by mouth daily. 07/10/20 10/08/20 Yes [name], MD  vitamin C (ASCORBIC ACID) 500 MG tablet Take 1,000 mg 2 (two) times daily by mouth.   Yes [provider]  diclofenac (VOLTAREN) 75 MG EC tablet Take 1 tablet (75 mg total) by mouth 2 (two) times daily. 11/17/19   Henson, Vickie L, NP-C  furosemide (LASIX) 20 MG tablet Take 2 tablets (40 mg total) by mouth as needed. FOR WEIGHT GAIN  3 TO 4 LBS. Patient not taking: Reported on 08/30/2020 07/10/20 10/08/20  [name], MD  linaclotide [facility]) 72 MCG capsule Take 1 capsule (72 mcg total) by mouth daily before breakfast. 06/23/20   Noralyn Pick, NP  ondansetron (ZOFRAN) 8 MG tablet Take 1 tablet (8 mg total) by mouth every 8 (eight) hours as needed for nausea or vomiting. 08/30/20   Hayden Pedro, PA-C  ondansetron (ZOFRAN) 8 MG tablet Take 1 tablet (8 mg total) by mouth 2 (two) times daily as needed (Nausea or vomiting). 09/15/20   [name], MD  prochlorperazine (COMPAZINE) 10 MG tablet Take 1 tablet (10 mg total) by mouth every 6 (six) hours as needed (Nausea or vomiting). 09/15/20   [name], MD  Saccharomyces boulardii (PROBIOTIC) 250 MG CAPS  Take by mouth.    [provider]  Semaglutide (RYBELSUS) 3 MG TABS Take 3 mg by mouth daily with breakfast. Patient not taking: No sig reported 11/25/19   Shamleffer, Melanie Crazier, MD  vitamin B-12 (CYANOCOBALAMIN) 500 MCG tablet Take 500 mcg by mouth daily. Patient not taking: No sig reported    [provider]     Vital Signs: Blood pressure 138/95, heart rate 87, respirations 16, O2 sat 100% room air  Ht _0  (1.702 m)   Wt 220 lb (99.8 kg)   BMI 34.46 kg/m   Physical Exam awake, alert.  Chest clear to auscultation bilaterally.  Heart with regular rate and rhythm.  Abdomen soft, positive bowel sounds, nontender.  No lower extremity edema.  Imaging: No results found.  Labs:  CBC: Recent Labs    09/26/20 1307 10/03/20 1009 10/09/20 1131 10/13/20 1318  WBC 4.2 2.9* 1.5* PENDING  HGB 10.4* 10.2* 10.2* 9.3*  HCT 31.9* 30.9* 30.5* 28.6*  PLT 250 334 392 311  COAGS: No results for input(s): INR, APTT in the last 8760 hours.  BMP: Recent Labs    02/16/20 0900 06/23/20 1004 09/15/20 1253 09/26/20 1307 10/03/20 1009 10/09/20 1131  NA 143   < > 146* 139 137 139  K 4.2   < > 4.4 3.8 4.2 4.1  CL 110   < > 109 108 109 107  CO2 24   < > _0 GLUCOSE 117*   < > 117* 150* 108* 133*  BUN 18   < > _1 CALCIUM 9.5   < > 8.9 9.4 9.0 9.6  CREATININE 1.01*   < > 0.81 0.88 0.78 0.85  GFRNONAA >60   < > >60 >60 >60 >60  GFRAA >60  --   --   --   --   --    < > = values in this interval not displayed.    LIVER FUNCTION TESTS: Recent Labs    09/15/20 1253 09/26/20 1307 10/03/20 1009 10/09/20 1131  BILITOT 0.4 0.5 0.4 0.6  AST _2 ALT _3 ALKPHOS 85 85 78 85  PROT 7.1 7.7 7.4 7.8  ALBUMIN 3.4* 3.9 3.9 4.2    Assessment and Plan: Patient familiar to IR service from right gluteal soft tissue mass biopsy on 08/22/2020 (positive for breast ca met).  She has a history of metastatic breast carcinoma with initial  diagnosis in left breast in 2018 with prior lumpectomy, radioactive seed implantation and chemoradiation.  She presents again today for Port-A-Cath placement for palliative chemotherapy. She is legally blind.Risks and benefits of image guided port-a-catheter placement was discussed with the patient including, but not limited to bleeding, infection, pneumothorax, or fibrin sheath development and need for additional procedures.  All of the patient's questions were answered, patient is agreeable to proceed. Consent signed and in chart.     Electronically Signed: D. Rowe Robert, PA-C 10/13/2020, 1:32 PM   I spent a total of 20 minutes at the the patient's bedside AND on the patient's hospital floor or unit, greater than 50% of which was counseling/coordinating care for Port-A-Cath placement

## 2020-10-16 ENCOUNTER — Encounter (INDEPENDENT_AMBULATORY_CARE_PROVIDER_SITE_OTHER): Payer: Medicare HMO | Admitting: Ophthalmology

## 2020-10-19 ENCOUNTER — Ambulatory Visit: Payer: Medicare HMO | Admitting: Internal Medicine

## 2020-10-20 NOTE — Progress Notes (Signed)
[place]   [phone] [phone]   Clinic Follow up Note   Patient Care Team: Girtha Rm, NP-C as PCP - General (Family Medicine) [name], MD as PCP - Cardiology (Cardiology) [name], MD as Consulting Physician (Hematology) [name], MD as Consulting Physician (General Surgery)  Date of Service:  10/23/2020  CHIEF COMPLAINT: F/u of metastaticleft breastcancer   SUMMARY OF ONCOLOGIC HISTORY: Oncology History Overview Note  Cancer Staging Malignant neoplasm of lower-outer quadrant of left breast of female, estrogen receptor negative ([place]) Staging form: Breast, AJCC 8th Edition - Clinical stage from 06/06/2017: Stage IB (cT1c, cN0, cM0, G3, ER: Negative, PR: Negative, HER2: Negative) - Signed by [name], MD on 06/15/2017 - Pathologic stage from 06/26/2017: Stage IB (pT1c, pN0, cM0, G3, ER: Negative, PR: Negative, HER2: Negative) - Signed by [name], MD on 07/10/2017     Malignant neoplasm of lower-outer quadrant of left breast of female, estrogen receptor negative ([place])  05/30/2017 Mammogram   Diagnostic mammo and US IMPRESSION: 1. Highly suspicious 1.4 cm mass in the slightly lower slightly outer left breast -tissue sampling recommended. 2. Indeterminate 0.5 mm mass in the slightly lower slightly outer left breast -tissue sampling recommended. 3. At least 2 left axillary lymph nodes with borderline cortical thickness.   06/06/2017 Initial Biopsy   Diagnosis 1. Breast, left, needle core biopsy, 5:30 o'clock - INVASIVE DUCTAL CARCINOMA, G3 2. Lymph node, needle/core biopsy, left axillary - NO CARCINOMA IDENTIFIED IN ONE LYMPH NODE (0/1)   06/06/2017 Initial Diagnosis   Malignant neoplasm of lower-outer quadrant of left breast of female, estrogen receptor negative ([place])   06/06/2017 Receptors her2   Estrogen Receptor: 0%, NEGATIVE Progesterone Receptor: 0%, NEGATIVE Proliferation Marker Ki67: 70%    06/26/2017 Surgery   LEFT BREAST LUMPECTOMY WITH RADIOACTIVE SEED AND SENTINEL LYMPH NODE BIOPSY ERAS  PATHWAY AND INSERTION PORT-A-CATH By Dr. Barry Dienes on 06/26/17    06/26/2017 Pathology Results   Diagnosis 06/26/17  1. Breast, lumpectomy, Left - INVASIVE DUCTAL CARCINOMA, GRADE III/III, SPANNING 1.2 CM. - THE SURGICAL RESECTION MARGINS ARE NEGATIVE FOR CARCINOMA. - SEE ONCOLOGY TABLE BELOW. 2. Lymph node, sentinel, biopsy, Left axillary #1 - THERE IS NO EVIDENCE OF CARCINOMA IN 1 OF 1 LYMPH NODE (0/1). 3. Lymph node, sentinel, biopsy, Left axillary #2 - THERE IS NO EVIDENCE OF CARCINOMA IN 1 OF 1 LYMPH NODE (0/1). 4. Lymph node, sentinel, biopsy, Left axillary #3 - THERE IS NO EVIDENCE OF CARCINOMA IN 1 OF 1 LYMPH NODE (0/1).    07/25/2017 - 09/29/2017 Chemotherapy   Adjuvant cytoxan and docetaxel (TC) every 3 weekd for 4 cycles     11/19/2017 - 12/17/2017 Radiation Therapy   Adjuvant breast radiation Left breast treated to 42.5 Gy with 17 fx of 2.5 Gy followed by a boost of 7.5 Gy with 3 fx of 2.5 Gy     02/2019 Procedure   She had PAC removal in 02/2019.    07/23/2020 Imaging   MRI Lumbar Spine 07/23/20 IMPRESSION: 1. No significant disc herniation, spinal canal or neural foraminal stenosis at any level. 2. Moderate facet degenerative changes at L3-4, L4-5 and L5-S1. 3. Multiple enlarged retroperitoneal and right iliac lymph nodes. Recommend correlation with CT of the abdomen and pelvis with contrast.   08/15/2020 Imaging   CT AP 08/15/20  IMPRESSION: 1. Right iliac and periaortic adenopathy is noted concerning for metastatic disease or lymphoma. Also noted is abnormal soft tissue mass anterior and posterior to the  right iliac wing concerning for malignancy or metastatic disease. MRI is recommended for further evaluation. 2. Irregular lucency with sclerotic margins is seen involving the right iliac crest concerning for possible lytic lesion. MRI may be performed for  further evaluation. 3. Enlarged fibroid uterus. 4. Small gallstone. 5. Aortic atherosclerosis.   08/22/2020 Pathology Results   FINAL MICROSCOPIC DIAGNOSIS: 08/22/20  A. NEEDLE CORE, RIGHT GLUTEUS MINIMUMUS, BIOPSY:  -  Metastatic carcinoma  -  See comment   COMMENT:   By immunohistochemistry, the neoplastic cells are positive for  cytokeratin-7 and have weak patchy positivity for GATA3 but are negative  for cytokeratin-20 and GCDFP.  The combined morphology and  immunophenotype, support metastasis from the patient's known breast  primary carcinoma.  Dr. Burr Medico was notified of these results on 08/24/2020.  Prognostic markers (ER, PR, Her2, PDL-1) are pending and will be  reported in an addendum   ADDENDUM:   PROGNOSTIC INDICATOR RESULTS:   Immunohistochemical and morphometric analysis performed manually   The tumor cells are NEGATIVE for Her2 (0).   Estrogen Receptor:       POSITIVE, 20%, WEAK STAINING  Progesterone Receptor:   NEGATIVE    PD-L1 (0) negative    09/04/2020 - 09/22/2020 Radiation Therapy   Palliative radiation to right iliac 09/04/20 - 09/22/20, Dr. Lisbeth Renshaw   09/26/2020 -  Chemotherapy   first line weekly Taxol 3 weeks on/1 week off starting 09/26/20    10/13/2020 Procedure    PAC placement on 10/13/20.       CURRENT THERAPY:  first line weekly Taxol 3 weeks on/1 week off starting 09/26/20, will give filgrastim as needed.   INTERVAL HISTORY:  Kristin Ward is here for a follow up. She presents to the clinic alone. She notes she was able to recover on her week off chemo. She notes her pain is much improved and mostly pressure now. She denies any new pain. She denies neuropathy. She has not used ice bags during infusion. She notes her buttock is black and itching s/p radiation. She notes she tolerated  Filgrastim well. She notes still having dry skin and hyperpigmentation from RT.    REVIEW OF SYSTEMS:   Constitutional: Denies fevers, chills or abnormal  weight loss Eyes: Denies blurriness of vision Ears, nose, mouth, throat, and face: Denies mucositis or sore throat Respiratory: Denies cough, dyspnea or wheezes Cardiovascular: Denies palpitation, chest discomfort or lower extremity swelling Gastrointestinal:  Denies nausea, heartburn or change in bowel habits Skin: Denies abnormal skin rashes Lymphatics: Denies new lymphadenopathy or easy bruising Neurological:Denies numbness, tingling or new weaknesses Behavioral/Psych: Mood is stable, no new changes  All other systems were reviewed with the patient and are negative.  MEDICAL HISTORY:  Past Medical History:  Diagnosis Date  . Abnormal x-ray of lungs with single pulmonary nodule 03/15/2016   per records from [place]   . Anemia   . Asthma   . Cholelithiasis 05/19/2017   On CT  . Coronary artery calcification seen on CAT scan 05/19/2017  . Diabetes mellitus without complication ([place])    type 2  . Dilated idiopathic cardiomyopathy ([place]) 12/2015   EF 15-20%. Diagnosed in [facility]  . Headache    NONE RECENT  . Hypertension   . Lymph nodes enlarged 07/25/2020  . Malignant neoplasm of lower-outer quadrant of left breast of female, estrogen receptor negative ([place]) 06/11/2017   left breast  . Mixed hyperlipidemia 06/17/2018  . partially blind   . Personal history of chemotherapy 2018-2019  .  Personal history of radiation therapy 2019  . Retinitis pigmentosa   . Retroperitoneal lymphadenopathy 07/25/2020  . Uveitis     SURGICAL HISTORY: Past Surgical History:  Procedure Laterality Date  . brain cyst removed  2007   to help with headaches per patient , aspirated   . BREAST BIOPSY Left 06/06/2017   x2  . BREAST CYST ASPIRATION Left 06/06/2017  . BREAST LUMPECTOMY Left 06/26/2017  . BREAST LUMPECTOMY WITH RADIOACTIVE SEED AND SENTINEL LYMPH NODE BIOPSY Left 06/26/2017   Procedure: BREAST LUMPECTOMY WITH RADIOACTIVE SEED AND SENTINEL LYMPH NODE BIOPSY ERAS   PATHWAY;  Surgeon: [name], MD;  Location: [place];  Service: General;  Laterality: Left;  pec block  . FINE NEEDLE ASPIRATION Left 02/23/2019   Procedure: FINE NEEDLE ASPIRATION;  Surgeon: [name], MD;  Location: [place];  Service: General;  Laterality: Left;  Asiration of left breast seroma   . IR IMAGING GUIDED PORT INSERTION  10/13/2020  . NASAL TURBINATE REDUCTION    . PORT-A-CATH REMOVAL Right 02/23/2019   Procedure: REMOVAL PORT-A-CATH;  Surgeon: [name], MD;  Location: [place];  Service: General;  Laterality: Right;  . PORTACATH PLACEMENT N/A 06/26/2017   Procedure: INSERTION PORT-A-CATH;  Surgeon: [name], MD;  Location: [place];  Service: General;  Laterality: N/A;  . TRANSTHORACIC ECHOCARDIOGRAM  12/2015   A) [facility] May 2017: Mild concentric LVH. Global hypokinesis. GR daily. EF 18% severe LA dilation. Mitral annular dilatation with papillary muscle dysfunction and moderate MR. Dilated IVC consistent with elevated RAP.  [facility] TRANSTHORACIC ECHOCARDIOGRAM  05/2017; 08/2017   a) Mild concentric LVH.  EF 45 to 50% with diffuse hypokinesis.  GR 1 DD.  Mild aortic root dilation.;; b)  Mild LVH EF 45 to 50%.  Diffuse HK.  No significant valve disease.  No significant change.    I have reviewed the social history and family history with the patient and they are unchanged from previous note.  ALLERGIES:  is allergic to morphine and related, latex, tylenol with codeine #3 [acetaminophen-codeine], and penicillins.  MEDICATIONS:  Current Outpatient Medications  Medication Sig Dispense Refill  . lidocaine-prilocaine (EMLA) cream Apply 1 application topically as needed. 30 g 0  . aspirin 81 MG chewable tablet Chew 81 mg by mouth daily.    . carvedilol (COREG) 12.5 MG tablet Take 1 tablet (12.5 mg total) by mouth 2 (two) times daily with a meal. 180 tablet 1  . Cholecalciferol (D3-1000 PO) Take by mouth.    . diclofenac (VOLTAREN)  75 MG EC tablet Take 1 tablet (75 mg total) by mouth 2 (two) times daily. 60 tablet 1  . furosemide (LASIX) 20 MG tablet Take 2 tablets (40 mg total) by mouth as needed. FOR WEIGHT GAIN  3 TO 4 LBS. (Patient not taking: Reported on 08/30/2020) 90 tablet 3  . glucose blood (ACCU-CHEK GUIDE) test strip Use as instructed to test blood sugar 2 times a day 100 strip 2  . ibuprofen (ADVIL) 800 MG tablet Take 1 tablet (800 mg total) by mouth every 8 (eight) hours as needed. 30 tablet 2  . linaclotide (LINZESS) 72 MCG capsule Take 1 capsule (72 mcg total) by mouth daily before breakfast. 30 capsule 1  . Multiple Vitamins-Minerals (WOMENS MULTI PO) Take by mouth.    . ondansetron (ZOFRAN) 8 MG tablet Take 1 tablet (8 mg total) by mouth every 8 (eight) hours as needed for nausea or vomiting. 20 tablet 0  .  ondansetron (ZOFRAN) 8 MG tablet Take 1 tablet (8 mg total) by mouth 2 (two) times daily as needed (Nausea or vomiting). 30 tablet 1  . oxyCODONE (OXY IR/ROXICODONE) 5 MG immediate release tablet Take 1 tablet (5 mg total) by mouth every 6 (six) hours as needed for severe pain. 60 tablet 0  . oxyCODONE-acetaminophen (PERCOCET) 5-325 MG tablet Take as needed twice a day for severe pain, or before scan 20 tablet 0  . prochlorperazine (COMPAZINE) 10 MG tablet Take 1 tablet (10 mg total) by mouth every 6 (six) hours as needed (Nausea or vomiting). 30 tablet 1  . Saccharomyces boulardii (PROBIOTIC) 250 MG CAPS Take by mouth.    . sacubitril-valsartan (ENTRESTO) 97-103 MG Take 1 tablet by mouth 2 (two) times daily. 180 tablet 3  . Semaglutide (RYBELSUS) 3 MG TABS Take 3 mg by mouth daily with breakfast. (Patient not taking: No sig reported) 30 tablet 3  . spironolactone (ALDACTONE) 25 MG tablet Take 1 tablet (25 mg total) by mouth daily. 90 tablet 3  . vitamin B-12 (CYANOCOBALAMIN) 500 MCG tablet Take 500 mcg by mouth daily. (Patient not taking: No sig reported)    . vitamin C (ASCORBIC ACID) 500 MG tablet Take  1,000 mg 2 (two) times daily by mouth.     No current facility-administered medications for this visit.    PHYSICAL EXAMINATION: ECOG PERFORMANCE STATUS: 2 - Symptomatic, <50% confined to bed Blood pressure 140/82, heart rate 87, respirate 18, temperature 36.9, pulse ox 100% on room air  Due to COVID19 we will limit examination to appearance. Patient had no complaints.  GENERAL:alert, no distress and comfortable SKIN: skin color, texture, turgor are normal, no rashes or significant lesions (+) Skin dryness and hyperpigmentation of buttock s/p RT EYES: normal, Conjunctiva are pink and non-injected, sclera clear  NEURO: alert & oriented x 3 with fluent speech   LABORATORY DATA:  I have reviewed the data as listed CBC Latest Ref Rng & Units 10/23/2020 10/13/2020 10/09/2020  WBC 4.0 - 10.5 K/uL 5.9 1.0(LL) 1.5(L)  Hemoglobin 12.0 - 15.0 g/dL 9.1(L) 9.3(L) 10.2(L)  Hematocrit 36.0 - 46.0 % 27.6(L) 28.6(L) 30.5(L)  Platelets 150 - 400 K/uL 288 311 392     CMP Latest Ref Rng & Units 10/23/2020 10/09/2020 10/03/2020  Glucose 70 - 99 mg/dL 121(H) 133(H) 108(H)  BUN 8 - 23 mg/dL '13 13 14  ' Creatinine 0.44 - 1.00 mg/dL 0.76 0.85 0.78  Sodium 135 - 145 mmol/L 139 139 137  Potassium 3.5 - 5.1 mmol/L 3.7 4.1 4.2  Chloride 98 - 111 mmol/L 108 107 109  CO2 22 - 32 mmol/L '25 23 22  ' Calcium 8.9 - 10.3 mg/dL 9.2 9.6 9.0  Total Protein 6.5 - 8.1 g/dL 7.3 7.8 7.4  Total Bilirubin 0.3 - 1.2 mg/dL 0.5 0.6 0.4  Alkaline Phos 38 - 126 U/L 104 85 78  AST 15 - 41 U/L 14(L) 24 17  ALT 0 - 44 U/L '18 25 16      ' RADIOGRAPHIC STUDIES: I have personally reviewed the radiological images as listed and agreed with the findings in the report. No results found.   ASSESSMENT & PLAN:  Kristin Ward is a 65 y.o. female with    1. Left breast invasive ductal carcinoma, stage IB, p(T1cN0M0), Triple negative, Grade 3, in 2018, metastatic disease to right gluteal mass/iliac and diffuse node metastasis in 08/2020  ER 20% weakly + PR/HER2 (0) negative, PD-L1 0% -She was initially diagnosed in 05/2017.She has  high risk for recurrence due to triple negative disease. She is s/p left breast lumpectomy, adjuvant chemo TC and Radiation. -Genetictesting was negative for pathogenetic mutations. -Due to recent right gluteal pain, she underwent biopsy on 09/02/20 which showed metastatic carcinoma, consistent with breast primary. ER 20% positive, ER and HER2 negative.  -I discussed with stage IV metastatic breast cancer, her cancer is no longer curable but still treatable. Goal of therapy is palliative to control her disease and prolong her life. -I started her on first-line chemotherapy with weekly Taxol on 09/26/20 for 3 weeks on/ 1 week off. I discussed if she does well on chemo for4-6 months, we can change treatment to antiestrogen therapy without chemo, given her ER weekly positivediease. Plan for next scan in May.  -She tolerated C1 overall well. She was able to recover on off week. She denies neuropathy so far. I discussed starting cryotherapy with infusion to reduce risk of neuropathy. She is willing to try.  -Labs reviewed, Hg 9.1, BG 121. Overall adequate to proceed with C2D1 Taxol today.  -Continue Taxol weekly 3 weeks on/ 1 week off. Will continue filgrastim as needed based on labs.  -F/u in 2 weeks with day 15 chemo.    2.Right hip pain -She notes she had recurrent right hip pain since 2020. This now radiates down her right hip and buttock pain. This has caused right leg Weakness -Her right hip pain is caused bythe large infiltrative mass around the right iliac wing, Secondary to metastatic disease.  -She completed Palliative radiation to right iliac 09/04/20 - 09/22/20 with Dr. Lisbeth Renshaw, and her pain is much improved. Her hip and back pain is now controlled on long acting oxycodone.  -She notes dry skin and hyperpigmentation of buttock from RT.   3. HTN,DM,COPD, retinitis pigmentosa with limited  vision,Chronic combined systolic and diastolic heart failure, EF 45-50% -Continue to follow-up withPCP Dr Forest Becker cardiologistDr Ellyn Hack -She is legally blind  4. Anemia -She developed anemia during adjuvant chemotherapy, which resolved after she completed chemo -She had recurrent mild anemia with Hgb 11.7 in 01/2019.Labs on 04/02/2019 showed normal iron, ferritin, folate, and MMA -Remains mild and intermittent.  5.Goal of care discussion  -The patient understands the goal of care is palliative. -she is full code now  6. Financial and social support  -She notes she was getting bills from [place]. I discussed getting more help with our financial advocate.  -She has had episode of deep depression since her metastatic cancer diagnosis. I encouraged her to f/u with SW for counseling. She is interested.    Plan -I called in EMLA cream today at [facility] -Lab reviewed, and adequate to proceed with C2D1 Taxol today, she agrees to use ice bags for her hands and feet -Lab, flush, Taxol in 1, 2, 4, 5, 6 weeks, with filgrastim as needed if ANC<1.3   -F/u in 2, 4 and 6 weeks.    No problem-specific Assessment & Plan notes found for this encounter.   No orders of the defined types were placed in this encounter.  All questions were answered. The patient knows to call the clinic with any problems, questions or concerns. No barriers to learning was detected. The total time spent in the appointment was 30 minutes.     [name], MD 10/23/2020  I, Joslyn Devon, am acting as scribe for [name], MD.   I have reviewed the above documentation for accuracy and completeness, and I agree with the above.

## 2020-10-23 ENCOUNTER — Inpatient Hospital Stay: Payer: Medicare Other

## 2020-10-23 ENCOUNTER — Other Ambulatory Visit: Payer: Self-pay

## 2020-10-23 ENCOUNTER — Inpatient Hospital Stay (HOSPITAL_BASED_OUTPATIENT_CLINIC_OR_DEPARTMENT_OTHER): Payer: Medicare Other | Admitting: Hematology

## 2020-10-23 VITALS — BP 140/82 | HR 87 | Temp 98.4°F | Resp 18 | Wt 221.2 lb

## 2020-10-23 DIAGNOSIS — Z95828 Presence of other vascular implants and grafts: Secondary | ICD-10-CM

## 2020-10-23 DIAGNOSIS — C50512 Malignant neoplasm of lower-outer quadrant of left female breast: Secondary | ICD-10-CM | POA: Diagnosis not present

## 2020-10-23 DIAGNOSIS — Z171 Estrogen receptor negative status [ER-]: Secondary | ICD-10-CM

## 2020-10-23 DIAGNOSIS — I5042 Chronic combined systolic (congestive) and diastolic (congestive) heart failure: Secondary | ICD-10-CM | POA: Diagnosis not present

## 2020-10-23 DIAGNOSIS — D649 Anemia, unspecified: Secondary | ICD-10-CM | POA: Diagnosis not present

## 2020-10-23 DIAGNOSIS — J449 Chronic obstructive pulmonary disease, unspecified: Secondary | ICD-10-CM | POA: Diagnosis not present

## 2020-10-23 DIAGNOSIS — H3552 Pigmentary retinal dystrophy: Secondary | ICD-10-CM | POA: Diagnosis not present

## 2020-10-23 DIAGNOSIS — E119 Type 2 diabetes mellitus without complications: Secondary | ICD-10-CM | POA: Diagnosis not present

## 2020-10-23 DIAGNOSIS — C7951 Secondary malignant neoplasm of bone: Secondary | ICD-10-CM | POA: Diagnosis not present

## 2020-10-23 DIAGNOSIS — Z5111 Encounter for antineoplastic chemotherapy: Secondary | ICD-10-CM | POA: Diagnosis not present

## 2020-10-23 DIAGNOSIS — Z5189 Encounter for other specified aftercare: Secondary | ICD-10-CM | POA: Diagnosis not present

## 2020-10-23 DIAGNOSIS — I110 Hypertensive heart disease with heart failure: Secondary | ICD-10-CM | POA: Diagnosis not present

## 2020-10-23 DIAGNOSIS — Z7189 Other specified counseling: Secondary | ICD-10-CM

## 2020-10-23 LAB — CBC WITH DIFFERENTIAL/PLATELET
Abs Immature Granulocytes: 0.22 10*3/uL — ABNORMAL HIGH (ref 0.00–0.07)
Basophils Absolute: 0 10*3/uL (ref 0.0–0.1)
Basophils Relative: 0 %
Eosinophils Absolute: 0 10*3/uL (ref 0.0–0.5)
Eosinophils Relative: 1 %
HCT: 27.6 % — ABNORMAL LOW (ref 36.0–46.0)
Hemoglobin: 9.1 g/dL — ABNORMAL LOW (ref 12.0–15.0)
Immature Granulocytes: 4 %
Lymphocytes Relative: 12 %
Lymphs Abs: 0.7 10*3/uL (ref 0.7–4.0)
MCH: 29.7 pg (ref 26.0–34.0)
MCHC: 33 g/dL (ref 30.0–36.0)
MCV: 90.2 fL (ref 80.0–100.0)
Monocytes Absolute: 0.5 10*3/uL (ref 0.1–1.0)
Monocytes Relative: 9 %
Neutro Abs: 4.4 10*3/uL (ref 1.7–7.7)
Neutrophils Relative %: 74 %
Platelets: 288 10*3/uL (ref 150–400)
RBC: 3.06 MIL/uL — ABNORMAL LOW (ref 3.87–5.11)
RDW: 16.2 % — ABNORMAL HIGH (ref 11.5–15.5)
WBC: 5.9 10*3/uL (ref 4.0–10.5)
nRBC: 0.3 % — ABNORMAL HIGH (ref 0.0–0.2)

## 2020-10-23 LAB — COMPREHENSIVE METABOLIC PANEL
ALT: 18 U/L (ref 0–44)
AST: 14 U/L — ABNORMAL LOW (ref 15–41)
Albumin: 3.7 g/dL (ref 3.5–5.0)
Alkaline Phosphatase: 104 U/L (ref 38–126)
Anion gap: 6 (ref 5–15)
BUN: 13 mg/dL (ref 8–23)
CO2: 25 mmol/L (ref 22–32)
Calcium: 9.2 mg/dL (ref 8.9–10.3)
Chloride: 108 mmol/L (ref 98–111)
Creatinine, Ser: 0.76 mg/dL (ref 0.44–1.00)
GFR, Estimated: >60 mL/min (ref >60)
Glucose, Bld: 121 mg/dL — ABNORMAL HIGH (ref 70–99)
Potassium: 3.7 mmol/L (ref 3.5–5.1)
Sodium: 139 mmol/L (ref 135–145)
Total Bilirubin: 0.5 mg/dL (ref 0.3–1.2)
Total Protein: 7.3 g/dL (ref 6.5–8.1)

## 2020-10-23 MED ORDER — SODIUM CHLORIDE 0.9 % IV SOLN
10.0000 mg | Freq: Once | INTRAVENOUS | Status: AC
  Administered 2020-10-23: 10 mg via INTRAVENOUS
  Filled 2020-10-23: qty 10

## 2020-10-23 MED ORDER — DIPHENHYDRAMINE HCL 50 MG/ML IJ SOLN
25.0000 mg | Freq: Once | INTRAMUSCULAR | Status: AC
  Administered 2020-10-23: 25 mg via INTRAVENOUS

## 2020-10-23 MED ORDER — SODIUM CHLORIDE 0.9% FLUSH
10.0000 mL | INTRAVENOUS | Status: DC | PRN
  Administered 2020-10-23: 10 mL via INTRAVENOUS
  Filled 2020-10-23: qty 10

## 2020-10-23 MED ORDER — SODIUM CHLORIDE 0.9 % IV SOLN
80.0000 mg/m2 | Freq: Once | INTRAVENOUS | Status: AC
  Administered 2020-10-23: 174 mg via INTRAVENOUS
  Filled 2020-10-23: qty 29

## 2020-10-23 MED ORDER — DIPHENHYDRAMINE HCL 50 MG/ML IJ SOLN
INTRAMUSCULAR | Status: AC
  Filled 2020-10-23: qty 1

## 2020-10-23 MED ORDER — FAMOTIDINE IN NACL 20-0.9 MG/50ML-% IV SOLN
INTRAVENOUS | Status: AC
  Filled 2020-10-23: qty 50

## 2020-10-23 MED ORDER — SODIUM CHLORIDE 0.9 % IV SOLN
Freq: Once | INTRAVENOUS | Status: AC
  Filled 2020-10-23: qty 250

## 2020-10-23 MED ORDER — SODIUM CHLORIDE 0.9% FLUSH
10.0000 mL | INTRAVENOUS | Status: DC | PRN
  Administered 2020-10-23: 10 mL
  Filled 2020-10-23: qty 10

## 2020-10-23 MED ORDER — HEPARIN SOD (PORK) LOCK FLUSH 100 UNIT/ML IV SOLN
500.0000 [IU] | Freq: Once | INTRAVENOUS | Status: AC | PRN
  Administered 2020-10-23: 500 [IU]
  Filled 2020-10-23: qty 5

## 2020-10-23 MED ORDER — FAMOTIDINE IN NACL 20-0.9 MG/50ML-% IV SOLN
20.0000 mg | Freq: Once | INTRAVENOUS | Status: AC
  Administered 2020-10-23: 20 mg via INTRAVENOUS

## 2020-10-23 MED ORDER — LIDOCAINE-PRILOCAINE 2.5-2.5 % EX CREA: 1.0000 "application " | TOPICAL_CREAM | CUTANEOUS | 0 refills | Status: DC | PRN

## 2020-10-23 NOTE — Patient Instructions (Signed)
Cancer Center Discharge Instructions for Patients Receiving Chemotherapy  Today you received the following chemotherapy agents: paclitaxel.  To help prevent nausea and vomiting after your treatment, we encourage you to take your nausea medication as directed.   If you develop nausea and vomiting that is not controlled by your nausea medication, call the clinic.   BELOW ARE SYMPTOMS THAT SHOULD BE REPORTED IMMEDIATELY:  *FEVER GREATER THAN 100.5 F  *CHILLS WITH OR WITHOUT FEVER  NAUSEA AND VOMITING THAT IS NOT CONTROLLED WITH YOUR NAUSEA MEDICATION  *UNUSUAL SHORTNESS OF BREATH  *UNUSUAL BRUISING OR BLEEDING  TENDERNESS IN MOUTH AND THROAT WITH OR WITHOUT PRESENCE OF ULCERS  *URINARY PROBLEMS  *BOWEL PROBLEMS  UNUSUAL RASH Items with * indicate a potential emergency and should be followed up as soon as possible.  Feel free to call the clinic should you have any questions or concerns. The clinic phone number is [phone].  Please show the CHEMO ALERT CARD at check-in to the Emergency Department and triage nurse.   

## 2020-10-24 ENCOUNTER — Telehealth: Payer: Self-pay | Admitting: Hematology

## 2020-10-24 ENCOUNTER — Encounter: Payer: Self-pay | Admitting: Hematology

## 2020-10-24 NOTE — Telephone Encounter (Signed)
Scheduled follow-up appointments per 3/14 los. Patient is aware. ?

## 2020-10-30 ENCOUNTER — Inpatient Hospital Stay: Payer: Medicare Other

## 2020-10-30 ENCOUNTER — Ambulatory Visit
Admission: RE | Admit: 2020-10-30 | Discharge: 2020-10-30 | Disposition: A | Discharge status: Home / Self Care | Payer: Medicare Other | Source: Ambulatory Visit | Attending: Radiation Oncology | Admitting: Radiation Oncology

## 2020-10-30 ENCOUNTER — Other Ambulatory Visit: Payer: Self-pay

## 2020-10-30 VITALS — BP 106/59 | HR 85 | Temp 98.2°F | Resp 18

## 2020-10-30 DIAGNOSIS — H3552 Pigmentary retinal dystrophy: Secondary | ICD-10-CM | POA: Diagnosis not present

## 2020-10-30 DIAGNOSIS — E119 Type 2 diabetes mellitus without complications: Secondary | ICD-10-CM | POA: Diagnosis not present

## 2020-10-30 DIAGNOSIS — Z95828 Presence of other vascular implants and grafts: Secondary | ICD-10-CM

## 2020-10-30 DIAGNOSIS — Z171 Estrogen receptor negative status [ER-]: Secondary | ICD-10-CM

## 2020-10-30 DIAGNOSIS — I110 Hypertensive heart disease with heart failure: Secondary | ICD-10-CM | POA: Diagnosis not present

## 2020-10-30 DIAGNOSIS — C50512 Malignant neoplasm of lower-outer quadrant of left female breast: Secondary | ICD-10-CM

## 2020-10-30 DIAGNOSIS — Z5111 Encounter for antineoplastic chemotherapy: Secondary | ICD-10-CM | POA: Diagnosis not present

## 2020-10-30 DIAGNOSIS — C7951 Secondary malignant neoplasm of bone: Secondary | ICD-10-CM | POA: Diagnosis not present

## 2020-10-30 DIAGNOSIS — I5042 Chronic combined systolic (congestive) and diastolic (congestive) heart failure: Secondary | ICD-10-CM | POA: Diagnosis not present

## 2020-10-30 DIAGNOSIS — Z5189 Encounter for other specified aftercare: Secondary | ICD-10-CM | POA: Diagnosis not present

## 2020-10-30 DIAGNOSIS — D649 Anemia, unspecified: Secondary | ICD-10-CM | POA: Diagnosis not present

## 2020-10-30 DIAGNOSIS — J449 Chronic obstructive pulmonary disease, unspecified: Secondary | ICD-10-CM | POA: Diagnosis not present

## 2020-10-30 DIAGNOSIS — Z510 Encounter for antineoplastic radiation therapy: Secondary | ICD-10-CM | POA: Insufficient documentation

## 2020-10-30 DIAGNOSIS — Z7189 Other specified counseling: Secondary | ICD-10-CM

## 2020-10-30 LAB — CBC WITH DIFFERENTIAL/PLATELET
Abs Immature Granulocytes: 0.07 10*3/uL (ref 0.00–0.07)
Basophils Absolute: 0 10*3/uL (ref 0.0–0.1)
Basophils Relative: 1 %
Eosinophils Absolute: 0.1 10*3/uL (ref 0.0–0.5)
Eosinophils Relative: 2 %
HCT: 28.8 % — ABNORMAL LOW (ref 36.0–46.0)
Hemoglobin: 9.4 g/dL — ABNORMAL LOW (ref 12.0–15.0)
Immature Granulocytes: 2 %
Lymphocytes Relative: 18 %
Lymphs Abs: 0.7 10*3/uL (ref 0.7–4.0)
MCH: 29.7 pg (ref 26.0–34.0)
MCHC: 32.6 g/dL (ref 30.0–36.0)
MCV: 91.1 fL (ref 80.0–100.0)
Monocytes Absolute: 0.2 10*3/uL (ref 0.1–1.0)
Monocytes Relative: 6 %
Neutro Abs: 3 10*3/uL (ref 1.7–7.7)
Neutrophils Relative %: 71 %
Platelets: 484 10*3/uL — ABNORMAL HIGH (ref 150–400)
RBC: 3.16 MIL/uL — ABNORMAL LOW (ref 3.87–5.11)
RDW: 15.7 % — ABNORMAL HIGH (ref 11.5–15.5)
WBC: 4.2 10*3/uL (ref 4.0–10.5)
nRBC: 0 % (ref 0.0–0.2)

## 2020-10-30 LAB — COMPREHENSIVE METABOLIC PANEL
ALT: 20 U/L (ref 0–44)
AST: 18 U/L (ref 15–41)
Albumin: 3.8 g/dL (ref 3.5–5.0)
Alkaline Phosphatase: 93 U/L (ref 38–126)
Anion gap: 6 (ref 5–15)
BUN: 16 mg/dL (ref 8–23)
CO2: 23 mmol/L (ref 22–32)
Calcium: 9.2 mg/dL (ref 8.9–10.3)
Chloride: 107 mmol/L (ref 98–111)
Creatinine, Ser: 0.77 mg/dL (ref 0.44–1.00)
GFR, Estimated: >60 mL/min (ref >60)
Glucose, Bld: 148 mg/dL — ABNORMAL HIGH (ref 70–99)
Potassium: 4.1 mmol/L (ref 3.5–5.1)
Sodium: 136 mmol/L (ref 135–145)
Total Bilirubin: 0.5 mg/dL (ref 0.3–1.2)
Total Protein: 7.4 g/dL (ref 6.5–8.1)

## 2020-10-30 MED ORDER — DIPHENHYDRAMINE HCL 50 MG/ML IJ SOLN
25.0000 mg | Freq: Once | INTRAMUSCULAR | Status: AC
  Administered 2020-10-30: 25 mg via INTRAVENOUS

## 2020-10-30 MED ORDER — SODIUM CHLORIDE 0.9% FLUSH
10.0000 mL | INTRAVENOUS | Status: DC | PRN
  Administered 2020-10-30: 10 mL
  Filled 2020-10-30: qty 10

## 2020-10-30 MED ORDER — HEPARIN SOD (PORK) LOCK FLUSH 100 UNIT/ML IV SOLN
500.0000 [IU] | Freq: Once | INTRAVENOUS | Status: AC | PRN
  Administered 2020-10-30: 500 [IU]
  Filled 2020-10-30: qty 5

## 2020-10-30 MED ORDER — FAMOTIDINE IN NACL 20-0.9 MG/50ML-% IV SOLN
20.0000 mg | Freq: Once | INTRAVENOUS | Status: AC
  Administered 2020-10-30: 20 mg via INTRAVENOUS

## 2020-10-30 MED ORDER — SODIUM CHLORIDE 0.9 % IV SOLN
10.0000 mg | Freq: Once | INTRAVENOUS | Status: AC
  Administered 2020-10-30: 10 mg via INTRAVENOUS
  Filled 2020-10-30: qty 10

## 2020-10-30 MED ORDER — SODIUM CHLORIDE 0.9 % IV SOLN
Freq: Once | INTRAVENOUS | Status: AC
  Filled 2020-10-30: qty 250

## 2020-10-30 MED ORDER — SODIUM CHLORIDE 0.9 % IV SOLN
80.0000 mg/m2 | Freq: Once | INTRAVENOUS | Status: AC
  Administered 2020-10-30: 174 mg via INTRAVENOUS
  Filled 2020-10-30: qty 29

## 2020-10-30 MED ORDER — DIPHENHYDRAMINE HCL 50 MG/ML IJ SOLN
INTRAMUSCULAR | Status: AC
  Filled 2020-10-30: qty 1

## 2020-10-30 MED ORDER — FAMOTIDINE IN NACL 20-0.9 MG/50ML-% IV SOLN
INTRAVENOUS | Status: AC
  Filled 2020-10-30: qty 50

## 2020-10-30 MED ORDER — SODIUM CHLORIDE 0.9% FLUSH
10.0000 mL | INTRAVENOUS | Status: DC | PRN
  Administered 2020-10-30: 10 mL via INTRAVENOUS
  Filled 2020-10-30: qty 10

## 2020-10-30 NOTE — Progress Notes (Signed)
One month follow-up post treatment call. Patient states that she is doing well since radiation treatment. Patient states that she still has some darkening at the treatment site. Advised patient that the darkening will eventually fade. Advised patient that she can use any moisturizer of her choice for skin. Advised that the skin is sensitive for about a year and has the potential to burn more easily. Advised patient to use sunscreen with an SPF of 30 or higher. Patient states that she is following up with Dr. Burr Medico in medical oncology.

## 2020-10-30 NOTE — Patient Instructions (Signed)
[facility] Discharge Instructions for Patients Receiving Chemotherapy  Today you received the following chemotherapy agents: paclitaxel.  To help prevent nausea and vomiting after your treatment, we encourage you to take your nausea medication as directed.   If you develop nausea and vomiting that is not controlled by your nausea medication, call the clinic.   BELOW ARE SYMPTOMS THAT SHOULD BE REPORTED IMMEDIATELY:  *FEVER GREATER THAN 100.5 F  *CHILLS WITH OR WITHOUT FEVER  NAUSEA AND VOMITING THAT IS NOT CONTROLLED WITH YOUR NAUSEA MEDICATION  *UNUSUAL SHORTNESS OF BREATH  *UNUSUAL BRUISING OR BLEEDING  TENDERNESS IN MOUTH AND THROAT WITH OR WITHOUT PRESENCE OF ULCERS  *URINARY PROBLEMS  *BOWEL PROBLEMS  UNUSUAL RASH Items with * indicate a potential emergency and should be followed up as soon as possible.  Feel free to call the clinic should you have any questions or concerns. The clinic phone number is [phone].  Please show the CHEMO ALERT CARD at check-in to the Emergency Department and triage nurse.   

## 2020-11-03 MED FILL — Dexamethasone Sodium Phosphate Inj 100 MG/10ML: INTRAMUSCULAR | Qty: 1 | Status: AC

## 2020-11-03 NOTE — Progress Notes (Signed)
[place]   Telephone:([phone]) [phone] Fax:([phone]) [phone]   Clinic Follow up Note   Patient Care Team: Girtha Rm, NP-C as PCP - General (Family Medicine) [name], MD as PCP - Cardiology (Cardiology) [name], MD as Consulting Physician (Hematology) [name], MD as Consulting Physician (General Surgery)  Date of Service:  11/06/2020  CHIEF COMPLAINT: F/u of metastaticleft breastcancer   SUMMARY OF ONCOLOGIC HISTORY: Oncology History Overview Note  Cancer Staging Malignant neoplasm of lower-outer quadrant of left breast of female, estrogen receptor negative ([place]) Staging form: Breast, AJCC 8th Edition - Clinical stage from 06/06/2017: Stage IB (cT1c, cN0, cM0, G3, ER: Negative, PR: Negative, HER2: Negative) - Signed by [name], MD on 06/15/2017 - Pathologic stage from 06/26/2017: Stage IB (pT1c, pN0, cM0, G3, ER: Negative, PR: Negative, HER2: Negative) - Signed by [name], MD on 07/10/2017     Malignant neoplasm of lower-outer quadrant of left breast of female, estrogen receptor negative ([place])  05/30/2017 Mammogram   Diagnostic mammo and US IMPRESSION: 1. Highly suspicious 1.4 cm mass in the slightly lower slightly outer left breast -tissue sampling recommended. 2. Indeterminate 0.5 mm mass in the slightly lower slightly outer left breast -tissue sampling recommended. 3. At least 2 left axillary lymph nodes with borderline cortical thickness.   06/06/2017 Initial Biopsy   Diagnosis 1. Breast, left, needle core biopsy, 5:30 o'clock - INVASIVE DUCTAL CARCINOMA, G3 2. Lymph node, needle/core biopsy, left axillary - NO CARCINOMA IDENTIFIED IN ONE LYMPH NODE (0/1)   06/06/2017 Initial Diagnosis   Malignant neoplasm of lower-outer quadrant of left breast of female, estrogen receptor negative ([place])   06/06/2017 Receptors her2   Estrogen Receptor: 0%, NEGATIVE Progesterone Receptor: 0%, NEGATIVE Proliferation Marker Ki67: 70%    06/26/2017 Surgery   LEFT BREAST LUMPECTOMY WITH RADIOACTIVE SEED AND SENTINEL LYMPH NODE BIOPSY ERAS  PATHWAY AND INSERTION PORT-A-CATH By Dr. Barry Dienes on 06/26/17    06/26/2017 Pathology Results   Diagnosis 06/26/17  1. Breast, lumpectomy, Left - INVASIVE DUCTAL CARCINOMA, GRADE III/III, SPANNING 1.2 CM. - THE SURGICAL RESECTION MARGINS ARE NEGATIVE FOR CARCINOMA. - SEE ONCOLOGY TABLE BELOW. 2. Lymph node, sentinel, biopsy, Left axillary #1 - THERE IS NO EVIDENCE OF CARCINOMA IN 1 OF 1 LYMPH NODE (0/1). 3. Lymph node, sentinel, biopsy, Left axillary #2 - THERE IS NO EVIDENCE OF CARCINOMA IN 1 OF 1 LYMPH NODE (0/1). 4. Lymph node, sentinel, biopsy, Left axillary #3 - THERE IS NO EVIDENCE OF CARCINOMA IN 1 OF 1 LYMPH NODE (0/1).    07/25/2017 - 09/29/2017 Chemotherapy   Adjuvant cytoxan and docetaxel (TC) every 3 weekd for 4 cycles     11/19/2017 - 12/17/2017 Radiation Therapy   Adjuvant breast radiation Left breast treated to 42.5 Gy with 17 fx of 2.5 Gy followed by a boost of 7.5 Gy with 3 fx of 2.5 Gy     02/2019 Procedure   She had PAC removal in 02/2019.    07/23/2020 Imaging   MRI Lumbar Spine 07/23/20 IMPRESSION: 1. No significant disc herniation, spinal canal or neural foraminal stenosis at any level. 2. Moderate facet degenerative changes at L3-4, L4-5 and L5-S1. 3. Multiple enlarged retroperitoneal and right iliac lymph nodes. Recommend correlation with CT of the abdomen and pelvis with contrast.   08/15/2020 Imaging   CT AP 08/15/20  IMPRESSION: 1. Right iliac and periaortic adenopathy is noted concerning for metastatic disease or lymphoma. Also noted is abnormal soft tissue mass anterior and posterior to the  right iliac wing concerning for malignancy or metastatic disease. MRI is recommended for further evaluation. 2. Irregular lucency with sclerotic margins is seen involving the right iliac crest concerning for possible lytic lesion. MRI may be performed for  further evaluation. 3. Enlarged fibroid uterus. 4. Small gallstone. 5. Aortic atherosclerosis.   08/22/2020 Pathology Results   FINAL MICROSCOPIC DIAGNOSIS: 08/22/20  A. NEEDLE CORE, RIGHT GLUTEUS MINIMUMUS, BIOPSY:  -  Metastatic carcinoma  -  See comment   COMMENT:   By immunohistochemistry, the neoplastic cells are positive for  cytokeratin-7 and have weak patchy positivity for GATA3 but are negative  for cytokeratin-20 and GCDFP.  The combined morphology and  immunophenotype, support metastasis from the patient's known breast  primary carcinoma.  Dr. Burr Medico was notified of these results on 08/24/2020.  Prognostic markers (ER, PR, Her2, PDL-1) are pending and will be  reported in an addendum   ADDENDUM:   PROGNOSTIC INDICATOR RESULTS:   Immunohistochemical and morphometric analysis performed manually   The tumor cells are NEGATIVE for Her2 (0).   Estrogen Receptor:       POSITIVE, 20%, WEAK STAINING  Progesterone Receptor:   NEGATIVE    PD-L1 (0) negative    09/04/2020 - 09/22/2020 Radiation Therapy   Palliative radiation to right iliac 09/04/20 - 09/22/20, Dr. Lisbeth Renshaw   09/26/2020 -  Chemotherapy   first line weekly Taxol 3 weeks on/1 week off starting 09/26/20    10/13/2020 Procedure    PAC placement on 10/13/20.       CURRENT THERAPY:  -first line weekly Taxol 3 weeks on/1 week off starting 09/26/20, will give filgrastim as needed. Reduced to 2 weeks on/1 week off starting with C3 due to neuropathy -Pending Zometa q66month   INTERVAL HISTORY:  [name] is here for a follow up. She presents to the clinic alone. She notes she is doing well. She notes neuropathy in her hands and feet. She notes ice has helped. She notes neuropathy started with C2. She has tingling mainly. This is not painful and does not wake her up at night. She has not used ice during infusions. She notes tightness in her right hip. She is still able to walk 14 miles often by walking to from her  home to [address] for 1.5 hours. She notes she has given DM medication, but stopped given her BG decreased after initial breast cancer and no longer sees her prior endocrinologist. I reviewed her medication list with her.    REVIEW OF SYSTEMS:   Constitutional: Denies fevers, chills or abnormal weight loss Eyes: Denies blurriness of vision Ears, nose, mouth, throat, and face: Denies mucositis or sore throat Respiratory: Denies cough, dyspnea or wheezes Cardiovascular: Denies palpitation, chest discomfort or lower extremity swelling Gastrointestinal:  Denies nausea, heartburn or change in bowel habits Skin: Denies abnormal skin rashes Lymphatics: Denies new lymphadenopathy or easy bruising Neurological: (+) Mild tingling in hands and feet Behavioral/Psych: Mood is stable, no new changes  All other systems were reviewed with the patient and are negative.  MEDICAL HISTORY:  Past Medical History:  Diagnosis Date  . Abnormal x-ray of lungs with single pulmonary nodule 03/15/2016   [place]  . Anemia   . Asthma   . Cholelithiasis 05/19/2017   On CT  . Coronary artery calcification seen on CAT scan 05/19/2017  . Diabetes mellitus without complication ([place]    type 2  . Dilated idiopathic cardiomyopathy ([facility] 12/2015   EF 15-20%. Diagnosed  in [facility]  . Headache    NONE RECENT  . Hypertension   . Lymph nodes enlarged 07/25/2020  . Malignant neoplasm of lower-outer quadrant of left breast of female, estrogen receptor negative ([facility]) 06/11/2017   left breast  . Mixed hyperlipidemia 06/17/2018  . partially blind   . Personal history of chemotherapy 2018-2019  . Personal history of radiation therapy 2019  . Retinitis pigmentosa   . Retroperitoneal lymphadenopathy 07/25/2020  . Uveitis     SURGICAL HISTORY: Past Surgical History:  Procedure Laterality Date  . brain cyst removed  2007   to help with headaches per patient , aspirated   .  BREAST BIOPSY Left 06/06/2017   x2  . BREAST CYST ASPIRATION Left 06/06/2017  . BREAST LUMPECTOMY Left 06/26/2017  . BREAST LUMPECTOMY WITH RADIOACTIVE SEED AND SENTINEL LYMPH NODE BIOPSY Left 06/26/2017   Procedure: BREAST LUMPECTOMY WITH RADIOACTIVE SEED AND SENTINEL LYMPH NODE BIOPSY ERAS  PATHWAY;  Surgeon: [name], MD;  Location: [place];  Service: General;  Laterality: Left;  pec block  . FINE NEEDLE ASPIRATION Left 02/23/2019   Procedure: FINE NEEDLE ASPIRATION;  Surgeon: [name], MD;  Location: [place];  Service: General;  Laterality: Left;  Asiration of left breast seroma   . IR IMAGING GUIDED PORT INSERTION  10/13/2020  . NASAL TURBINATE REDUCTION    . PORT-A-CATH REMOVAL Right 02/23/2019   Procedure: REMOVAL PORT-A-CATH;  Surgeon: [name], MD;  Location: [place];  Service: General;  Laterality: Right;  . PORTACATH PLACEMENT N/A 06/26/2017   Procedure: INSERTION PORT-A-CATH;  Surgeon: [name], MD;  Location: [place];  Service: General;  Laterality: N/A;  . TRANSTHORACIC ECHOCARDIOGRAM  12/2015   A) [facility] May 2017: Mild concentric LVH. Global hypokinesis. GR daily. EF 18% severe LA dilation. Mitral annular dilatation with papillary muscle dysfunction and moderate MR. Dilated IVC consistent with elevated RAP.  [facility] TRANSTHORACIC ECHOCARDIOGRAM  05/2017; 08/2017   a) Mild concentric LVH.  EF 45 to 50% with diffuse hypokinesis.  GR 1 DD.  Mild aortic root dilation.;; b)  Mild LVH EF 45 to 50%.  Diffuse HK.  No significant valve disease.  No significant change.    I have reviewed the social history and family history with the patient and they are unchanged from previous note.  ALLERGIES:  is allergic to morphine and related, latex, tylenol with codeine #3 [acetaminophen-codeine], and penicillins.  MEDICATIONS:  Current Outpatient Medications  Medication Sig Dispense Refill  . aspirin 81 MG chewable tablet Chew 81 mg by  mouth daily.    . carvedilol (COREG) 12.5 MG tablet Take 1 tablet (12.5 mg total) by mouth 2 (two) times daily with a meal. 180 tablet 1  . Cholecalciferol (D3-1000 PO) Take by mouth.    . diclofenac (VOLTAREN) 75 MG EC tablet Take 1 tablet (75 mg total) by mouth 2 (two) times daily. 60 tablet 1  . glucose blood (ACCU-CHEK GUIDE) test strip Use as instructed to test blood sugar 2 times a day 100 strip 2  . ibuprofen (ADVIL) 800 MG tablet Take 1 tablet (800 mg total) by mouth every 8 (eight) hours as needed. 30 tablet 2  . lidocaine-prilocaine (EMLA) cream Apply 1 application topically as needed. 30 g 0  . linaclotide (LINZESS) 72 MCG capsule Take 1 capsule (72 mcg total) by mouth daily before breakfast. 30 capsule 1  . Multiple Vitamins-Minerals (WOMENS MULTI PO) Take by mouth.    . ondansetron (  ZOFRAN) 8 MG tablet Take 1 tablet (8 mg total) by mouth every 8 (eight) hours as needed for nausea or vomiting. 20 tablet 0  . ondansetron (ZOFRAN) 8 MG tablet Take 1 tablet (8 mg total) by mouth 2 (two) times daily as needed (Nausea or vomiting). 30 tablet 1  . prochlorperazine (COMPAZINE) 10 MG tablet Take 1 tablet (10 mg total) by mouth every 6 (six) hours as needed (Nausea or vomiting). 30 tablet 1  . Saccharomyces boulardii (PROBIOTIC) 250 MG CAPS Take by mouth.    . sacubitril-valsartan (ENTRESTO) 97-103 MG Take 1 tablet by mouth 2 (two) times daily. 180 tablet 3  . spironolactone (ALDACTONE) 25 MG tablet Take 1 tablet (25 mg total) by mouth daily. 90 tablet 3  . vitamin B-12 (CYANOCOBALAMIN) 500 MCG tablet Take 500 mcg by mouth daily. (Patient not taking: No sig reported)    . vitamin C (ASCORBIC ACID) 500 MG tablet Take 1,000 mg 2 (two) times daily by mouth.     No current facility-administered medications for this visit.   Facility-Administered Medications Ordered in Other Visits  Medication Dose Route Frequency Provider Last Rate Last Admin  . heparin lock flush 100 unit/mL  500 Units  Intracatheter Once PRN [name], MD      . sodium chloride flush (NS) 0.9 % injection 10 mL  10 mL Intracatheter PRN [name], MD        PHYSICAL EXAMINATION: ECOG PERFORMANCE STATUS: 1 - Symptomatic but completely ambulatory  Vitals:   11/06/20 1128  BP: (!) 174/104  Pulse: 83  Resp: 17  Temp: (!) 97.2 F (36.2 C)  SpO2: 100%   Filed Weights   11/06/20 1128  Weight: 224 lb 3.2 oz (101.7 kg)    GENERAL:alert, no distress and comfortable SKIN: skin color, texture, turgor are normal, no rashes or significant lesions EYES: normal, Conjunctiva are pink and non-injected, sclera clear  NECK: supple, thyroid normal size, non-tender, without nodularity LYMPH:  no palpable lymphadenopathy in the cervical, axillary  LUNGS: clear to auscultation and percussion with normal breathing effort HEART: regular rate & rhythm and no murmurs and no lower extremity edema ABDOMEN:abdomen soft, non-tender and normal bowel sounds Musculoskeletal:no cyanosis of digits and no clubbing  NEURO: alert & oriented x 3 with fluent speech, no focal motor (+) Mild sensory deficits  LABORATORY DATA:  I have reviewed the data as listed CBC Latest Ref Rng & Units 11/06/2020 10/30/2020 10/23/2020  WBC 4.0 - 10.5 K/uL 2.9(L) 4.2 5.9  Hemoglobin 12.0 - 15.0 g/dL 9.3(L) 9.4(L) 9.1(L)  Hematocrit 36.0 - 46.0 % 28.2(L) 28.8(L) 27.6(L)  Platelets 150 - 400 K/uL 374 484(H) 288     CMP Latest Ref Rng & Units 11/06/2020 10/30/2020 10/23/2020  Glucose 70 - 99 mg/dL 127(H) 148(H) 121(H)  BUN 8 - 23 mg/dL '15 16 13  ' Creatinine 0.44 - 1.00 mg/dL 0.76 0.77 0.76  Sodium 135 - 145 mmol/L 141 136 139  Potassium 3.5 - 5.1 mmol/L 4.3 4.1 3.7  Chloride 98 - 111 mmol/L 110 107 108  CO2 22 - 32 mmol/L '24 23 25  ' Calcium 8.9 - 10.3 mg/dL 9.2 9.2 9.2  Total Protein 6.5 - 8.1 g/dL 7.2 7.4 7.3  Total Bilirubin 0.3 - 1.2 mg/dL 0.4 0.5 0.5  Alkaline Phos 38 - 126 U/L 92 93 104  AST 15 - 41 U/L 27 18 14(L)  ALT 0 - 44 U/L '28 20 18       ' RADIOGRAPHIC STUDIES: I have personally  reviewed the radiological images as listed and agreed with the findings in the report. No results found.   ASSESSMENT & PLAN:  Kristin Ward is a 65 y.o. female with    1. Left breast invasive ductal carcinoma, stage IB, p(T1cN0M0), Triple negative, Grade 3, in 2018, metastatic disease to right gluteal mass/iliac and diffuse node metastasis in 08/2020 ER 20% weakly + PR/HER2 (0) negative, PD-L1 0% -She was initially diagnosed in 05/2017.She has high risk for recurrence due to triple negative disease. She is s/p left breast lumpectomy, adjuvant chemo TC and Radiation. -Genetictesting was negative for pathogenetic mutations. -Due to recent right gluteal pain, she underwent biopsy on 09/02/20 which showed metastatic carcinoma, consistent with breast primary. ER 20% positive, ER and HER2 negative.  -I discussed with stage IV metastatic breast cancer, her cancer is no longer curable but still treatable. Goal of therapy is palliative to control her disease and prolong her life. -I started her onfirst-line chemotherapy with weekly Taxolon 09/26/20 for 3 weeks on/ 1 week off.I discussed if she does well on chemo for4-6 months, we can change treatment to antiestrogen therapy without chemo, given her ER weekly positivediease. Plan for next scan in May.  -S/p C2D8 she has mild tingling in her hands and feet. She can continue using ice and I recommend she use it during infusion.  -Labs reviewed, WBC 2.9, Hg 9.3. Overall adequate to proceed with C2D15 Taxol today with dose reduction given her neuropathy.  -Starting with C3 will reduce to 2 weeks on/1 week off Taxol. May stop Taxol sooner overall and switch to different chemo if not tolerable.  -F/u in 2 weeks with C3D1   2. G1 peripheral Neuropathy  -S/p C2D8 Taxol she developed mild tingling in her hands and feet.  -She uses ice bags at home, I recommend she use ice bags during infusion.  -I  will reduce Taxol dose and reduce to 2 weeks on/1 week off starting with C3.   3.Right hip pain, Right iliac bone met, Vit D deficiency -She notes she had recurrent right hip pain since 2020 that radiated down her right hip and buttock pain. -Her right hip pain is caused bythe large infiltrative mass around the right iliac wing, Secondary to metastatic disease.  -ShecompletedPalliative radiation to right iliac 09/04/20 - 09/22/20 with Dr. Lisbeth Renshaw, and her pain is much improved. She ambulates with cane. She recently has not been on Oxycodone.  -She has right hip tightness which she attributes to returning to 10-14 mile walks.  -Given recent decreased Vit D level, she can continue Vit D 2000-5000 units. I also recommend she take calcium  -Given localized bone met, I recommend Zometa q24month to strengthen her bones and treatment for her bone mets. I reviewed side effects with her. She is agreeable. Will start after next dental visit.   4. HTN,DM,COPD, retinitis pigmentosa with limited vision,Chronic combined systolic and diastolic heart failure, EF 45-50% -Continue to follow-up withPCP Dr HForest BeckercardiologistDr HEllyn Hack-She is legally blind -She notes previously being seen by endocrinologist who had her on DM medication and insulin. Patient stopped medication and seeing her after BG decreased. I will check her A1c with next labs.   5. Anemia -She developed anemia during adjuvant chemotherapy, which resolved after she completed chemo -She had recurrent mild anemia with Hgb 11.7 in 01/2019.Labs on 04/02/2019 showed normal iron, ferritin, folate, and MMA -Remains mild and intermittent.  6.Goal of care discussion  -The patient understands the goal of care is palliative. -  she is full code now  7. Financial and social support  -She notes she was getting bills from [place]. I discussed getting more help with our financial advocate.  -She has had episode of deep depression since  her metastatic cancer diagnosis. I encouraged her to f/u with SW for counseling. She is interested.   Plan -Lab reviewed, and adequate to proceed with C2D15 Taxol today with dose reduction. She agrees to use ice bags for her hands and feet during infusion  -Will check Vit D and HbA1c at next labs.  -will change chemo to 2 weeks on and 1 week off from next cycle  -Lab, flush, Taxol in 2, 3 weeks with filgrastim as needed if ANC<1.3   -F/u in 2 weeks   No problem-specific Assessment & Plan notes found for this encounter.   Orders Placed This Encounter  Procedures  . Vitamin D 25 hydroxy    Standing Status:   Standing    Number of Occurrences:   4    Standing Expiration Date:   11/06/2021  . Hemoglobin A1c    Standing Status:   Standing    Number of Occurrences:   4    Standing Expiration Date:   11/06/2021   All questions were answered. The patient knows to call the clinic with any problems, questions or concerns. No barriers to learning was detected. The total time spent in the appointment was 30 minutes.     [name], MD 11/06/2020   I, Joslyn Devon, am acting as scribe for [name], MD.   I have reviewed the above documentation for accuracy and completeness, and I agree with the above.

## 2020-11-06 ENCOUNTER — Encounter: Payer: Self-pay | Admitting: Hematology

## 2020-11-06 ENCOUNTER — Inpatient Hospital Stay (HOSPITAL_BASED_OUTPATIENT_CLINIC_OR_DEPARTMENT_OTHER): Payer: Medicare Other | Admitting: Hematology

## 2020-11-06 ENCOUNTER — Other Ambulatory Visit: Payer: Self-pay

## 2020-11-06 ENCOUNTER — Inpatient Hospital Stay: Payer: Medicare Other

## 2020-11-06 VITALS — BP 174/104 | HR 83 | Temp 97.2°F | Resp 17 | Wt 224.2 lb

## 2020-11-06 VITALS — BP 140/98 | HR 84

## 2020-11-06 DIAGNOSIS — Z171 Estrogen receptor negative status [ER-]: Secondary | ICD-10-CM | POA: Diagnosis not present

## 2020-11-06 DIAGNOSIS — D649 Anemia, unspecified: Secondary | ICD-10-CM | POA: Diagnosis not present

## 2020-11-06 DIAGNOSIS — H3552 Pigmentary retinal dystrophy: Secondary | ICD-10-CM | POA: Diagnosis not present

## 2020-11-06 DIAGNOSIS — Z5111 Encounter for antineoplastic chemotherapy: Secondary | ICD-10-CM | POA: Diagnosis not present

## 2020-11-06 DIAGNOSIS — I5042 Chronic combined systolic (congestive) and diastolic (congestive) heart failure: Secondary | ICD-10-CM | POA: Diagnosis not present

## 2020-11-06 DIAGNOSIS — C50512 Malignant neoplasm of lower-outer quadrant of left female breast: Secondary | ICD-10-CM

## 2020-11-06 DIAGNOSIS — E119 Type 2 diabetes mellitus without complications: Secondary | ICD-10-CM | POA: Diagnosis not present

## 2020-11-06 DIAGNOSIS — J449 Chronic obstructive pulmonary disease, unspecified: Secondary | ICD-10-CM | POA: Diagnosis not present

## 2020-11-06 DIAGNOSIS — Z5189 Encounter for other specified aftercare: Secondary | ICD-10-CM | POA: Diagnosis not present

## 2020-11-06 DIAGNOSIS — Z95828 Presence of other vascular implants and grafts: Secondary | ICD-10-CM

## 2020-11-06 DIAGNOSIS — I110 Hypertensive heart disease with heart failure: Secondary | ICD-10-CM | POA: Diagnosis not present

## 2020-11-06 DIAGNOSIS — C7951 Secondary malignant neoplasm of bone: Secondary | ICD-10-CM | POA: Diagnosis not present

## 2020-11-06 DIAGNOSIS — Z7189 Other specified counseling: Secondary | ICD-10-CM

## 2020-11-06 LAB — COMPREHENSIVE METABOLIC PANEL
ALT: 28 U/L (ref 0–44)
AST: 27 U/L (ref 15–41)
Albumin: 3.8 g/dL (ref 3.5–5.0)
Alkaline Phosphatase: 92 U/L (ref 38–126)
Anion gap: 7 (ref 5–15)
BUN: 15 mg/dL (ref 8–23)
CO2: 24 mmol/L (ref 22–32)
Calcium: 9.2 mg/dL (ref 8.9–10.3)
Chloride: 110 mmol/L (ref 98–111)
Creatinine, Ser: 0.76 mg/dL (ref 0.44–1.00)
GFR, Estimated: >60 mL/min (ref >60)
Glucose, Bld: 127 mg/dL — ABNORMAL HIGH (ref 70–99)
Potassium: 4.3 mmol/L (ref 3.5–5.1)
Sodium: 141 mmol/L (ref 135–145)
Total Bilirubin: 0.4 mg/dL (ref 0.3–1.2)
Total Protein: 7.2 g/dL (ref 6.5–8.1)

## 2020-11-06 LAB — CBC WITH DIFFERENTIAL/PLATELET
Abs Immature Granulocytes: 0.12 10*3/uL — ABNORMAL HIGH (ref 0.00–0.07)
Basophils Absolute: 0 10*3/uL (ref 0.0–0.1)
Basophils Relative: 1 %
Eosinophils Absolute: 0.1 10*3/uL (ref 0.0–0.5)
Eosinophils Relative: 5 %
HCT: 28.2 % — ABNORMAL LOW (ref 36.0–46.0)
Hemoglobin: 9.3 g/dL — ABNORMAL LOW (ref 12.0–15.0)
Immature Granulocytes: 4 %
Lymphocytes Relative: 20 %
Lymphs Abs: 0.6 10*3/uL — ABNORMAL LOW (ref 0.7–4.0)
MCH: 30.1 pg (ref 26.0–34.0)
MCHC: 33 g/dL (ref 30.0–36.0)
MCV: 91.3 fL (ref 80.0–100.0)
Monocytes Absolute: 0.2 10*3/uL (ref 0.1–1.0)
Monocytes Relative: 5 %
Neutro Abs: 1.9 10*3/uL (ref 1.7–7.7)
Neutrophils Relative %: 65 %
Platelets: 374 10*3/uL (ref 150–400)
RBC: 3.09 MIL/uL — ABNORMAL LOW (ref 3.87–5.11)
RDW: 16.4 % — ABNORMAL HIGH (ref 11.5–15.5)
WBC: 2.9 10*3/uL — ABNORMAL LOW (ref 4.0–10.5)
nRBC: 0 % (ref 0.0–0.2)

## 2020-11-06 MED ORDER — DIPHENHYDRAMINE HCL 50 MG/ML IJ SOLN
INTRAMUSCULAR | Status: AC
  Filled 2020-11-06: qty 1

## 2020-11-06 MED ORDER — SODIUM CHLORIDE 0.9% FLUSH
10.0000 mL | INTRAVENOUS | Status: DC | PRN
  Filled 2020-11-06: qty 10

## 2020-11-06 MED ORDER — FAMOTIDINE IN NACL 20-0.9 MG/50ML-% IV SOLN
INTRAVENOUS | Status: AC
  Filled 2020-11-06: qty 50

## 2020-11-06 MED ORDER — SODIUM CHLORIDE 0.9 % IV SOLN: 5.0000 mg | Freq: Once | INTRAVENOUS | Status: DC

## 2020-11-06 MED ORDER — DEXAMETHASONE SODIUM PHOSPHATE 10 MG/ML IJ SOLN
5.0000 mg | Freq: Once | INTRAMUSCULAR | Status: AC
  Administered 2020-11-06: 5 mg via INTRAVENOUS

## 2020-11-06 MED ORDER — FAMOTIDINE IN NACL 20-0.9 MG/50ML-% IV SOLN
20.0000 mg | Freq: Once | INTRAVENOUS | Status: AC
  Administered 2020-11-06: 20 mg via INTRAVENOUS

## 2020-11-06 MED ORDER — DIPHENHYDRAMINE HCL 50 MG/ML IJ SOLN
25.0000 mg | Freq: Once | INTRAMUSCULAR | Status: AC
  Administered 2020-11-06: 25 mg via INTRAVENOUS

## 2020-11-06 MED ORDER — FILGRASTIM-SNDZ 480 MCG/0.8ML IJ SOSY: 480.0000 ug | PREFILLED_SYRINGE | Freq: Once | INTRAMUSCULAR | Status: DC

## 2020-11-06 MED ORDER — SODIUM CHLORIDE 0.9% FLUSH
10.0000 mL | INTRAVENOUS | Status: DC | PRN
  Administered 2020-11-06: 10 mL via INTRAVENOUS
  Filled 2020-11-06: qty 10

## 2020-11-06 MED ORDER — SODIUM CHLORIDE 0.9 % IV SOLN
Freq: Once | INTRAVENOUS | Status: AC
  Filled 2020-11-06: qty 250

## 2020-11-06 MED ORDER — SODIUM CHLORIDE 0.9 % IV SOLN
60.0000 mg/m2 | Freq: Once | INTRAVENOUS | Status: AC
  Administered 2020-11-06: 132 mg via INTRAVENOUS
  Filled 2020-11-06: qty 22

## 2020-11-06 MED ORDER — DEXAMETHASONE SODIUM PHOSPHATE 10 MG/ML IJ SOLN
INTRAMUSCULAR | Status: AC
  Filled 2020-11-06: qty 1

## 2020-11-06 MED ORDER — HEPARIN SOD (PORK) LOCK FLUSH 100 UNIT/ML IV SOLN
500.0000 [IU] | Freq: Once | INTRAVENOUS | Status: DC | PRN
  Filled 2020-11-06: qty 5

## 2020-11-06 NOTE — Patient Instructions (Signed)

## 2020-11-06 NOTE — Patient Instructions (Signed)
[facility] Discharge Instructions for Patients Receiving Chemotherapy  Today you received the following chemotherapy agents: paclitaxel.  To help prevent nausea and vomiting after your treatment, we encourage you to take your nausea medication as directed.   If you develop nausea and vomiting that is not controlled by your nausea medication, call the clinic.   BELOW ARE SYMPTOMS THAT SHOULD BE REPORTED IMMEDIATELY:  *FEVER GREATER THAN 100.5 F  *CHILLS WITH OR WITHOUT FEVER  NAUSEA AND VOMITING THAT IS NOT CONTROLLED WITH YOUR NAUSEA MEDICATION  *UNUSUAL SHORTNESS OF BREATH  *UNUSUAL BRUISING OR BLEEDING  TENDERNESS IN MOUTH AND THROAT WITH OR WITHOUT PRESENCE OF ULCERS  *URINARY PROBLEMS  *BOWEL PROBLEMS  UNUSUAL RASH Items with * indicate a potential emergency and should be followed up as soon as possible.  Feel free to call the clinic should you have any questions or concerns. The clinic phone number is [phone].  Please show the CHEMO ALERT CARD at check-in to the Emergency Department and triage nurse.   

## 2020-11-07 ENCOUNTER — Telehealth: Payer: Self-pay | Admitting: Hematology

## 2020-11-07 NOTE — Telephone Encounter (Signed)
Scheduled follow-up appointments per 3/28 los. Patient is aware. 

## 2020-11-13 ENCOUNTER — Ambulatory Visit: Payer: Medicare Other | Admitting: Cardiology

## 2020-11-17 NOTE — Progress Notes (Signed)
Kristin Ward   [phone] [phone]) [phone]   Clinic Follow up Note   Patient Care Team: Girtha Rm, NP-C as PCP - General (Family Medicine) [name], MD as PCP - Cardiology (Cardiology) [name], MD as Consulting Physician (Hematology) [name], MD as Consulting Physician (General Surgery)  Date of Service:  11/20/2020  CHIEF COMPLAINT:  F/u ofmetastaticleft breastcancer  SUMMARY OF ONCOLOGIC HISTORY: Oncology History Overview Note  Cancer Staging Malignant neoplasm of lower-outer quadrant of left breast of female, estrogen receptor negative ([place]) Staging form: Breast, AJCC 8th Edition - Clinical stage from 06/06/2017: Stage IB (cT1c, cN0, cM0, G3, ER: Negative, PR: Negative, HER2: Negative) - Signed by [name], MD on 06/15/2017 - Pathologic stage from 06/26/2017: Stage IB (pT1c, pN0, cM0, G3, ER: Negative, PR: Negative, HER2: Negative) - Signed by [name], MD on 07/10/2017     Malignant neoplasm of lower-outer quadrant of left breast of female, estrogen receptor negative ([place])  05/30/2017 Mammogram   Diagnostic mammo and US IMPRESSION: 1. Highly suspicious 1.4 cm mass in the slightly lower slightly outer left breast -tissue sampling recommended. 2. Indeterminate 0.5 mm mass in the slightly lower slightly outer left breast -tissue sampling recommended. 3. At least 2 left axillary lymph nodes with borderline cortical thickness.   06/06/2017 Initial Biopsy   Diagnosis 1. Breast, left, needle core biopsy, 5:30 o'clock - INVASIVE DUCTAL CARCINOMA, G3 2. Lymph node, needle/core biopsy, left axillary - NO CARCINOMA IDENTIFIED IN ONE LYMPH NODE (0/1)   06/06/2017 Initial Diagnosis   Malignant neoplasm of lower-outer quadrant of left breast of female, estrogen receptor negative ([place])   06/06/2017 Receptors her2   Estrogen Receptor: 0%, NEGATIVE Progesterone Receptor: 0%, NEGATIVE Proliferation Marker Ki67: 70%    06/26/2017 Surgery   LEFT BREAST LUMPECTOMY WITH RADIOACTIVE SEED AND SENTINEL LYMPH NODE BIOPSY ERAS  PATHWAY AND INSERTION PORT-A-CATH By Dr. Barry Dienes on 06/26/17    06/26/2017 Pathology Results   Diagnosis 06/26/17  1. Breast, lumpectomy, Left - INVASIVE DUCTAL CARCINOMA, GRADE III/III, SPANNING 1.2 CM. - THE SURGICAL RESECTION MARGINS ARE NEGATIVE FOR CARCINOMA. - SEE ONCOLOGY TABLE BELOW. 2. Lymph node, sentinel, biopsy, Left axillary #1 - THERE IS NO EVIDENCE OF CARCINOMA IN 1 OF 1 LYMPH NODE (0/1). 3. Lymph node, sentinel, biopsy, Left axillary #2 - THERE IS NO EVIDENCE OF CARCINOMA IN 1 OF 1 LYMPH NODE (0/1). 4. Lymph node, sentinel, biopsy, Left axillary #3 - THERE IS NO EVIDENCE OF CARCINOMA IN 1 OF 1 LYMPH NODE (0/1).    07/25/2017 - 09/29/2017 Chemotherapy   Adjuvant cytoxan and docetaxel (TC) every 3 weekd for 4 cycles     11/19/2017 - 12/17/2017 Radiation Therapy   Adjuvant breast radiation Left breast treated to 42.5 Gy with 17 fx of 2.5 Gy followed by a boost of 7.5 Gy with 3 fx of 2.5 Gy     02/2019 Procedure   She had PAC removal in 02/2019.    07/23/2020 Imaging   MRI Lumbar Spine 07/23/20 IMPRESSION: 1. No significant disc herniation, spinal canal or neural foraminal stenosis at any level. 2. Moderate facet degenerative changes at L3-4, L4-5 and L5-S1. 3. Multiple enlarged retroperitoneal and right iliac lymph nodes. Recommend correlation with CT of the abdomen and pelvis with contrast.   08/15/2020 Imaging   CT AP 08/15/20  IMPRESSION: 1. Right iliac and periaortic adenopathy is noted concerning for metastatic disease or lymphoma. Also noted is abnormal soft tissue mass anterior and posterior to the right  iliac wing concerning for malignancy or metastatic disease. MRI is recommended for further evaluation. 2. Irregular lucency with sclerotic margins is seen involving the right iliac crest concerning for possible lytic lesion. MRI may be performed for  further evaluation. 3. Enlarged fibroid uterus. 4. Small gallstone. 5. Aortic atherosclerosis.   08/22/2020 Pathology Results   FINAL MICROSCOPIC DIAGNOSIS: 08/22/20  A. NEEDLE CORE, RIGHT GLUTEUS MINIMUMUS, BIOPSY:  -  Metastatic carcinoma  -  See comment   COMMENT:   By immunohistochemistry, the neoplastic cells are positive for  cytokeratin-7 and have weak patchy positivity for GATA3 but are negative  for cytokeratin-20 and GCDFP.  The combined morphology and  immunophenotype, support metastasis from the patient's known breast  primary carcinoma.  Dr. Burr Medico was notified of these results on 08/24/2020.  Prognostic markers (ER, PR, Her2, PDL-1) are pending and will be  reported in an addendum   ADDENDUM:   PROGNOSTIC INDICATOR RESULTS:   Immunohistochemical and morphometric analysis performed manually   The tumor cells are NEGATIVE for Her2 (0).   Estrogen Receptor:       POSITIVE, 20%, WEAK STAINING  Progesterone Receptor:   NEGATIVE    PD-L1 (0) negative    09/04/2020 - 09/22/2020 Radiation Therapy   Palliative radiation to right iliac 09/04/20 - 09/22/20, Dr. Lisbeth Renshaw   09/26/2020 -  Chemotherapy   first line weekly Taxol 3 weeks on/1 week off starting 09/26/20    10/13/2020 Procedure    PAC placement on 10/13/20.       CURRENT THERAPY:  -first line weekly Taxol3 weeks on/1 week offstarting 09/26/20, will givefilgrastimas needed.Reduced to 2 weeks on/1 week off starting with C3 due to neuropathy -Pending Zometa q42month  INTERVAL HISTORY:  PTakasha Ward is here for a follow up. She was last seen by me on 11/06/20. She presents to the clinic alone. She notes she is still walking 7 miles a day. She notes she has tingling in her feet and tightness in her right hip. She denies pain but notes feeling it. She notes on her week off chemo she felt better. She was recently seen by her dentist. She plans to get her dentures this year. She notes her BG is 68-114 range. She is  wants to know her A1c. She notes right after treatment she will have left ribcage tenderness.    REVIEW OF SYSTEMS:   Constitutional: Denies fevers, chills or abnormal weight loss Eyes: Denies blurriness of vision Ears, nose, mouth, throat, and face: Denies mucositis or sore throat Respiratory: Denies cough, dyspnea or wheezes Cardiovascular: Denies palpitation, chest discomfort or lower extremity swelling Gastrointestinal:  Denies nausea, heartburn or change in bowel habits Skin: Denies abnormal skin rashes MSK: (+) tightness in right hip Lymphatics: Denies new lymphadenopathy or easy bruising Neurological:Denies numbness or new weaknesses (+) Tingling in her feet Behavioral/Psych: Mood is stable, no new changes  All other systems were reviewed with the patient and are negative.  MEDICAL HISTORY:  Past Medical History:  Diagnosis Date  . Abnormal x-ray of lungs with single pulmonary nodule 03/15/2016   [place]  . Anemia   . Asthma   . Cholelithiasis 05/19/2017   On CT  . Coronary artery calcification seen on CAT scan 05/19/2017  . Diabetes mellitus without complication ([place]    type 2  . Dilated idiopathic cardiomyopathy ([place] 12/2015   [facility] 15-20%. [facility] . Headache    NONE RECENT  . Hypertension   .  Lymph nodes enlarged 07/25/2020  . Malignant neoplasm of lower-outer quadrant of left breast of female, estrogen receptor negative ([place]) 06/11/2017   left breast  . Mixed hyperlipidemia 06/17/2018  . partially blind   . Personal history of chemotherapy 2018-2019  . Personal history of radiation therapy 2019  . Retinitis pigmentosa   . Retroperitoneal lymphadenopathy 07/25/2020  . Uveitis     SURGICAL HISTORY: Past Surgical History:  Procedure Laterality Date  . brain cyst removed  2007   to help with headaches per patient , aspirated   . BREAST BIOPSY Left 06/06/2017   x2  . BREAST CYST ASPIRATION Left 06/06/2017   . BREAST LUMPECTOMY Left 06/26/2017  . BREAST LUMPECTOMY WITH RADIOACTIVE SEED AND SENTINEL LYMPH NODE BIOPSY Left 06/26/2017   Procedure: BREAST LUMPECTOMY WITH RADIOACTIVE SEED AND SENTINEL LYMPH NODE BIOPSY ERAS  PATHWAY;  Surgeon: [name], MD;  Location: [place];  Service: General;  Laterality: Left;  pec block  . FINE NEEDLE ASPIRATION Left 02/23/2019   Procedure: FINE NEEDLE ASPIRATION;  Surgeon: [name], MD;  Location: [place];  Service: General;  Laterality: Left;  Asiration of left breast seroma   . IR IMAGING GUIDED PORT INSERTION  10/13/2020  . NASAL TURBINATE REDUCTION    . PORT-A-CATH REMOVAL Right 02/23/2019   Procedure: REMOVAL PORT-A-CATH;  Surgeon: [name], MD;  Location: [place];  Service: General;  Laterality: Right;  . PORTACATH PLACEMENT N/A 06/26/2017   Procedure: INSERTION PORT-A-CATH;  Surgeon: [name], MD;  Location: [place];  Service: General;  Laterality: N/A;  . TRANSTHORACIC ECHOCARDIOGRAM  12/2015   A) [facility] May 2017: Mild concentric LVH. Global hypokinesis. GR daily. EF 18% severe LA dilation. Mitral annular dilatation with papillary muscle dysfunction and moderate MR. Dilated IVC consistent with elevated RAP.  [facility] TRANSTHORACIC ECHOCARDIOGRAM  05/2017; 08/2017   a) Mild concentric LVH.  EF 45 to 50% with diffuse hypokinesis.  GR 1 DD.  Mild aortic root dilation.;; b)  Mild LVH EF 45 to 50%.  Diffuse HK.  No significant valve disease.  No significant change.    I have reviewed the social history and family history with the patient and they are unchanged from previous note.  ALLERGIES:  is allergic to morphine and related, latex, tylenol with codeine #3 [acetaminophen-codeine], and penicillins.  MEDICATIONS:  Current Outpatient Medications  Medication Sig Dispense Refill  . aspirin 81 MG chewable tablet Chew 81 mg by mouth daily.    . carvedilol (COREG) 12.5 MG tablet Take 1 tablet (12.5 mg  total) by mouth 2 (two) times daily with a meal. 180 tablet 1  . Cholecalciferol (D3-1000 PO) Take by mouth.    . diclofenac (VOLTAREN) 75 MG EC tablet Take 1 tablet (75 mg total) by mouth 2 (two) times daily. 60 tablet 1  . glucose blood (ACCU-CHEK GUIDE) test strip Use as instructed to test blood sugar 2 times a day 100 strip 2  . ibuprofen (ADVIL) 800 MG tablet Take 1 tablet (800 mg total) by mouth every 8 (eight) hours as needed. 30 tablet 2  . lidocaine-prilocaine (EMLA) cream Apply 1 application topically as needed. 30 g 0  . linaclotide (LINZESS) 72 MCG capsule Take 1 capsule (72 mcg total) by mouth daily before breakfast. 30 capsule 1  . Multiple Vitamins-Minerals (WOMENS MULTI PO) Take by mouth.    . ondansetron (ZOFRAN) 8 MG tablet Take 1 tablet (8 mg total) by mouth every 8 (eight) hours as  needed for nausea or vomiting. 20 tablet 0  . ondansetron (ZOFRAN) 8 MG tablet Take 1 tablet (8 mg total) by mouth 2 (two) times daily as needed (Nausea or vomiting). 30 tablet 1  . prochlorperazine (COMPAZINE) 10 MG tablet Take 1 tablet (10 mg total) by mouth every 6 (six) hours as needed (Nausea or vomiting). 30 tablet 1  . Saccharomyces boulardii (PROBIOTIC) 250 MG CAPS Take by mouth.    . sacubitril-valsartan (ENTRESTO) 97-103 MG Take 1 tablet by mouth 2 (two) times daily. 180 tablet 3  . spironolactone (ALDACTONE) 25 MG tablet Take 1 tablet (25 mg total) by mouth daily. 90 tablet 3  . vitamin B-12 (CYANOCOBALAMIN) 500 MCG tablet Take 500 mcg by mouth daily. (Patient not taking: No sig reported)    . vitamin C (ASCORBIC ACID) 500 MG tablet Take 1,000 mg 2 (two) times daily by mouth.     No current facility-administered medications for this visit.    PHYSICAL EXAMINATION: ECOG PERFORMANCE STATUS: 1 - Symptomatic but completely ambulatory  Vitals:   11/20/20 1254  BP: (!) 147/86  Pulse: 93  Resp: 20  Temp: 97.9 F (36.6 C)  SpO2: 98%   Filed Weights   11/20/20 1254  Weight: 229 lb  9.6 oz (104.1 kg)    GENERAL:alert, no distress and comfortable SKIN: skin color, texture, turgor are normal, no rashes or significant lesions EYES: normal, Conjunctiva are pink and non-injected, sclera clear  NECK: supple, thyroid normal size, non-tender, without nodularity LYMPH:  no palpable lymphadenopathy in the cervical, axillary  LUNGS: clear to auscultation and percussion with normal breathing effort HEART: regular rate & rhythm and no murmurs and no lower extremity edema ABDOMEN:abdomen soft, non-tender and normal bowel sounds Musculoskeletal:no cyanosis of digits and no clubbing  NEURO: alert & oriented x 3 with fluent speech, no focal motor/sensory deficits BREAST: S/p left lumpectomy: Surgical incision healed well (+) Known inferior left breast benign mass now 4x4cm (previously 5.5x3cm). Right Breast exam benign.   LABORATORY DATA:  I have reviewed the data as listed CBC Latest Ref Rng & Units 11/20/2020 11/06/2020 10/30/2020  WBC 4.0 - 10.5 K/uL 4.2 2.9(L) 4.2  Hemoglobin 12.0 - 15.0 g/dL 9.9(L) 9.3(L) 9.4(L)  Hematocrit 36.0 - 46.0 % 30.5(L) 28.2(L) 28.8(L)  Platelets 150 - 400 K/uL 340 374 484(H)     CMP Latest Ref Rng & Units 11/20/2020 11/06/2020 10/30/2020  Glucose 70 - 99 mg/dL 197(H) 127(H) 148(H)  BUN 8 - 23 mg/dL '13 15 16  ' Creatinine 0.44 - 1.00 mg/dL 0.88 0.76 0.77  Sodium 135 - 145 mmol/L 143 141 136  Potassium 3.5 - 5.1 mmol/L 4.0 4.3 4.1  Chloride 98 - 111 mmol/L 109 110 107  CO2 22 - 32 mmol/L '23 24 23  ' Calcium 8.9 - 10.3 mg/dL 9.2 9.2 9.2  Total Protein 6.5 - 8.1 g/dL 7.0 7.2 7.4  Total Bilirubin 0.3 - 1.2 mg/dL 0.5 0.4 0.5  Alkaline Phos 38 - 126 U/L 98 92 93  AST 15 - 41 U/L '24 27 18  ' ALT 0 - 44 U/L '27 28 20      ' RADIOGRAPHIC STUDIES: I have personally reviewed the radiological images as listed and agreed with the findings in the report. No results found.   ASSESSMENT & PLAN:  Kristin Ward is a 65 y.o. female with   1. Left breast  invasive ductal carcinoma, stage IB, p(T1cN0M0), Triple negative, Grade 3, in 2018, metastatic disease to right gluteal mass/iliac and diffuse node  metastasis in 08/2020 ER 20% weakly + PR/HER2 (0) negative, PD-L1 0% -She was initially diagnosed in 05/2017.She has high risk for recurrence due to triple negative disease. She is s/p left breast lumpectomy, adjuvant chemo TC and Radiation. -Genetictesting was negative for pathogenetic mutations. -Due to recent right gluteal pain, she underwent biopsy on 09/02/20 which showed metastatic carcinoma, consistent with breast primary. ER 20% positive, ER and HER2 negative.  -I discussed with stage IV metastatic breast cancer, her cancer is no longer curable but still treatable. Goal of therapy is palliative to control her disease and prolong her life. -I started her onfirst-line chemotherapy with weekly Taxolon 2/15/22for 3 weeks on/ 1 week off.I discussed if she does well on chemo for4-6 months, we can change treatment to antiestrogen therapy without chemo, given her ER weekly positivediease. Plan for next scan in May.  -Taxol reduced to 2 weeks on/1 week off given her neuropathy starting with C3. May stop Taxol sooner if neuropathy gets worse  -Labs reviewed, Hg 9.9, BG 197. Overall adequate to proceed with C3D1 Taxol today. She will proceed with day 8 next week.  -F/u in 3 weeks for C4.   2. G1 peripheral Neuropathy  -S/p C2D8 Taxol she developed mild tingling in her hands and feet.  -She uses ice bags at home, I recommend she use ice bags during infusion.  -I will reduce Taxol dose and reduce to 2 weeks on/1 week off starting with C3.   3.Right hip pain, Right iliac bone met, Vit D deficiency -She notes she had recurrent right hip pain since 2020 that radiated down her right hip and buttock pain. -Her right hip pain is caused bythe large infiltrative mass around the right iliac wing, Secondary to metastatic disease.   -ShecompletedPalliative radiation to right iliac 09/04/20 - 09/22/20 with Dr. Lisbeth Renshaw, and her pain is much improved.She ambulates with cane. She recently has not been on Oxycodone.  -She has right hip tightness which she attributes to returning to 10-14 mile walks.  -Given recent decreased Vit D level, she can continue Vit D 2000-5000 units. I also recommend she take calcium  -Given localized bone met, I recommend Zometa q74month to strengthen her bones and treatment for her bone mets. I plan to start in 11/2020.    4. HTN,DM,COPD, retinitis pigmentosa with limited vision,Chronic combined systolic and diastolic heart failure, EF 45-50% -Continue to follow-up withPCP Dr HForest BeckercardiologistDr HEllyn Hack-She is legally blind -She notes previously being seen by endocrinologist who had her on DM medication and insulin. Patient stopped medication and seeing her after BG decreased. I will check her A1c with next labs.   5. Anemia -She developed anemia during adjuvant chemotherapy, which resolved after she completed chemo -She had recurrent mild anemia with Hgb 11.7 in 01/2019.Labs on 04/02/2019 showed normal iron, ferritin, folate, and MMA -Remains mild and intermittent.  6.Goal of care discussion  -The patient understands the goal of care is palliative. -she is full code now  7. Financial and social support  -She notes she was getting bills from [place]. I discussed getting more help with our financial advocate.  -She has had episode of deep depression since her metastatic cancer diagnosis. I encouraged her to f/u with SW for counseling. She is interested.   Plan -Lab reviewed,and adequate to proceed with C3D1 Taxol today at same dose.  -Will check HbA1c and Vit D today  -Lab, flush, Taxol in1, 3, 4 weeks with filgrastimas neededif ANC<1.3 -F/u in 3 weeks  -  CT CAP w contrast and bone scan in 5-6 weeks.    No problem-specific Assessment & Plan notes found for this  encounter.   Orders Placed This Encounter  Procedures  . CT CHEST ABDOMEN PELVIS W CONTRAST    Standing Status:   Future    Standing Expiration Date:   11/20/2021    Order Specific Question:   If indicated for the ordered procedure, I authorize the administration of contrast media per Radiology protocol    Answer:   Yes    Order Specific Question:   Preferred imaging location?    Answer:   [facility]    Order Specific Question:   Release to patient    Answer:   Immediate    Order Specific Question:   Is Oral Contrast requested for this exam?    Answer:   Yes, Per Radiology protocol    Order Specific Question:   Reason for Exam (SYMPTOM  OR DIAGNOSIS REQUIRED)    Answer:   evaulate response to chemo  . NM Bone Scan Whole Body    Standing Status:   Future    Standing Expiration Date:   11/20/2021    Order Specific Question:   If indicated for the ordered procedure, I authorize the administration of a radiopharmaceutical per Radiology protocol    Answer:   Yes    Order Specific Question:   Preferred imaging location?    Answer:   [facility]   All questions were answered. The patient knows to call the clinic with any problems, questions or concerns. No barriers to learning was detected. The total time spent in the appointment was 30 minutes.     [name], MD 11/20/2020   I, Joslyn Devon, am acting as scribe for [name], MD.   I have reviewed the above documentation for accuracy and completeness, and I agree with the above.

## 2020-11-20 ENCOUNTER — Other Ambulatory Visit: Payer: Self-pay

## 2020-11-20 ENCOUNTER — Inpatient Hospital Stay: Payer: Medicare Other

## 2020-11-20 ENCOUNTER — Inpatient Hospital Stay: Payer: Medicare Other | Attending: Hematology

## 2020-11-20 ENCOUNTER — Inpatient Hospital Stay (HOSPITAL_BASED_OUTPATIENT_CLINIC_OR_DEPARTMENT_OTHER): Payer: Medicare Other | Admitting: Hematology

## 2020-11-20 VITALS — BP 147/86 | HR 93 | Temp 97.9°F | Resp 20 | Ht 67.0 in | Wt 229.6 lb

## 2020-11-20 DIAGNOSIS — I5042 Chronic combined systolic (congestive) and diastolic (congestive) heart failure: Secondary | ICD-10-CM | POA: Diagnosis not present

## 2020-11-20 DIAGNOSIS — C7989 Secondary malignant neoplasm of other specified sites: Secondary | ICD-10-CM | POA: Insufficient documentation

## 2020-11-20 DIAGNOSIS — C50512 Malignant neoplasm of lower-outer quadrant of left female breast: Secondary | ICD-10-CM | POA: Diagnosis not present

## 2020-11-20 DIAGNOSIS — Z5111 Encounter for antineoplastic chemotherapy: Secondary | ICD-10-CM | POA: Insufficient documentation

## 2020-11-20 DIAGNOSIS — E119 Type 2 diabetes mellitus without complications: Secondary | ICD-10-CM | POA: Insufficient documentation

## 2020-11-20 DIAGNOSIS — F32A Depression, unspecified: Secondary | ICD-10-CM | POA: Insufficient documentation

## 2020-11-20 DIAGNOSIS — Z7189 Other specified counseling: Secondary | ICD-10-CM

## 2020-11-20 DIAGNOSIS — Z171 Estrogen receptor negative status [ER-]: Secondary | ICD-10-CM

## 2020-11-20 DIAGNOSIS — D649 Anemia, unspecified: Secondary | ICD-10-CM | POA: Insufficient documentation

## 2020-11-20 DIAGNOSIS — G620 Drug-induced polyneuropathy: Secondary | ICD-10-CM | POA: Diagnosis not present

## 2020-11-20 DIAGNOSIS — Z95828 Presence of other vascular implants and grafts: Secondary | ICD-10-CM

## 2020-11-20 LAB — CBC WITH DIFFERENTIAL/PLATELET
Abs Immature Granulocytes: 0.05 10*3/uL (ref 0.00–0.07)
Basophils Absolute: 0 10*3/uL (ref 0.0–0.1)
Basophils Relative: 0 %
Eosinophils Absolute: 0.1 10*3/uL (ref 0.0–0.5)
Eosinophils Relative: 2 %
HCT: 30.5 % — ABNORMAL LOW (ref 36.0–46.0)
Hemoglobin: 9.9 g/dL — ABNORMAL LOW (ref 12.0–15.0)
Immature Granulocytes: 1 %
Lymphocytes Relative: 17 %
Lymphs Abs: 0.7 10*3/uL (ref 0.7–4.0)
MCH: 30.4 pg (ref 26.0–34.0)
MCHC: 32.5 g/dL (ref 30.0–36.0)
MCV: 93.6 fL (ref 80.0–100.0)
Monocytes Absolute: 0.6 10*3/uL (ref 0.1–1.0)
Monocytes Relative: 14 %
Neutro Abs: 2.7 10*3/uL (ref 1.7–7.7)
Neutrophils Relative %: 66 %
Platelets: 340 10*3/uL (ref 150–400)
RBC: 3.26 MIL/uL — ABNORMAL LOW (ref 3.87–5.11)
RDW: 17.3 % — ABNORMAL HIGH (ref 11.5–15.5)
WBC: 4.2 10*3/uL (ref 4.0–10.5)
nRBC: 0 % (ref 0.0–0.2)

## 2020-11-20 LAB — COMPREHENSIVE METABOLIC PANEL
ALT: 27 U/L (ref 0–44)
AST: 24 U/L (ref 15–41)
Albumin: 3.8 g/dL (ref 3.5–5.0)
Alkaline Phosphatase: 98 U/L (ref 38–126)
Anion gap: 11 (ref 5–15)
BUN: 13 mg/dL (ref 8–23)
CO2: 23 mmol/L (ref 22–32)
Calcium: 9.2 mg/dL (ref 8.9–10.3)
Chloride: 109 mmol/L (ref 98–111)
Creatinine, Ser: 0.88 mg/dL (ref 0.44–1.00)
GFR, Estimated: >60 mL/min (ref >60)
Glucose, Bld: 197 mg/dL — ABNORMAL HIGH (ref 70–99)
Potassium: 4 mmol/L (ref 3.5–5.1)
Sodium: 143 mmol/L (ref 135–145)
Total Bilirubin: 0.5 mg/dL (ref 0.3–1.2)
Total Protein: 7 g/dL (ref 6.5–8.1)

## 2020-11-20 LAB — VITAMIN D 25 HYDROXY (VIT D DEFICIENCY, FRACTURES): Vit D, 25-Hydroxy: 33.49 ng/mL (ref 30–100)

## 2020-11-20 LAB — HEMOGLOBIN A1C
Hgb A1c MFr Bld: 6.2 % — ABNORMAL HIGH (ref 4.8–5.6)
Mean Plasma Glucose: 131.24 mg/dL

## 2020-11-20 MED ORDER — HEPARIN SOD (PORK) LOCK FLUSH 100 UNIT/ML IV SOLN
500.0000 [IU] | Freq: Once | INTRAVENOUS | Status: AC | PRN
  Administered 2020-11-20: 500 [IU]
  Filled 2020-11-20: qty 5

## 2020-11-20 MED ORDER — SODIUM CHLORIDE 0.9 % IV SOLN: 5.0000 mg | Freq: Once | INTRAVENOUS | Status: DC

## 2020-11-20 MED ORDER — FAMOTIDINE IN NACL 20-0.9 MG/50ML-% IV SOLN
20.0000 mg | Freq: Once | INTRAVENOUS | Status: AC
  Administered 2020-11-20: 20 mg via INTRAVENOUS

## 2020-11-20 MED ORDER — SODIUM CHLORIDE 0.9% FLUSH
10.0000 mL | INTRAVENOUS | Status: DC | PRN
  Administered 2020-11-20: 10 mL
  Filled 2020-11-20: qty 10

## 2020-11-20 MED ORDER — SODIUM CHLORIDE 0.9% FLUSH
10.0000 mL | INTRAVENOUS | Status: DC | PRN
  Administered 2020-11-20: 10 mL via INTRAVENOUS
  Filled 2020-11-20: qty 10

## 2020-11-20 MED ORDER — ZOLEDRONIC ACID 4 MG/100ML IV SOLN
INTRAVENOUS | Status: AC
  Filled 2020-11-20: qty 100

## 2020-11-20 MED ORDER — PACLITAXEL CHEMO INJECTION 300 MG/50ML
60.0000 mg/m2 | Freq: Once | INTRAVENOUS | Status: AC
  Administered 2020-11-20: 132 mg via INTRAVENOUS
  Filled 2020-11-20: qty 22

## 2020-11-20 MED ORDER — SODIUM CHLORIDE 0.9 % IV SOLN
Freq: Once | INTRAVENOUS | Status: AC
  Filled 2020-11-20: qty 250

## 2020-11-20 MED ORDER — FILGRASTIM-SNDZ 480 MCG/0.8ML IJ SOSY: 480.0000 ug | PREFILLED_SYRINGE | Freq: Once | INTRAMUSCULAR | Status: DC

## 2020-11-20 MED ORDER — FAMOTIDINE IN NACL 20-0.9 MG/50ML-% IV SOLN
INTRAVENOUS | Status: AC
  Filled 2020-11-20: qty 50

## 2020-11-20 MED ORDER — DEXAMETHASONE SODIUM PHOSPHATE 10 MG/ML IJ SOLN
5.0000 mg | Freq: Once | INTRAMUSCULAR | Status: AC
  Administered 2020-11-20: 5 mg via INTRAVENOUS

## 2020-11-20 MED ORDER — DIPHENHYDRAMINE HCL 50 MG/ML IJ SOLN
25.0000 mg | Freq: Once | INTRAMUSCULAR | Status: AC
  Administered 2020-11-20: 25 mg via INTRAVENOUS

## 2020-11-20 MED ORDER — DIPHENHYDRAMINE HCL 50 MG/ML IJ SOLN
INTRAMUSCULAR | Status: AC
  Filled 2020-11-20: qty 1

## 2020-11-20 MED ORDER — ZOLEDRONIC ACID 4 MG/100ML IV SOLN
4.0000 mg | Freq: Once | INTRAVENOUS | Status: AC
  Administered 2020-11-20: 4 mg via INTRAVENOUS

## 2020-11-20 MED ORDER — DEXAMETHASONE SODIUM PHOSPHATE 10 MG/ML IJ SOLN
INTRAMUSCULAR | Status: AC
  Filled 2020-11-20: qty 1

## 2020-11-20 NOTE — Patient Instructions (Signed)
[facility] Discharge Instructions for Patients Receiving Chemotherapy  Today you received the following chemotherapy agents: paclitaxel.  To help prevent nausea and vomiting after your treatment, we encourage you to take your nausea medication as directed.   If you develop nausea and vomiting that is not controlled by your nausea medication, call the clinic.   BELOW ARE SYMPTOMS THAT SHOULD BE REPORTED IMMEDIATELY:  *FEVER GREATER THAN 100.5 F  *CHILLS WITH OR WITHOUT FEVER  NAUSEA AND VOMITING THAT IS NOT CONTROLLED WITH YOUR NAUSEA MEDICATION  *UNUSUAL SHORTNESS OF BREATH  *UNUSUAL BRUISING OR BLEEDING  TENDERNESS IN MOUTH AND THROAT WITH OR WITHOUT PRESENCE OF ULCERS  *URINARY PROBLEMS  *BOWEL PROBLEMS  UNUSUAL RASH Items with * indicate a potential emergency and should be followed up as soon as possible.  Feel free to call the clinic should you have any questions or concerns. The clinic phone number is [phone].  Please show the [facility] at check-in to the Emergency Department and triage nurse.  Zoledronic Acid Injection (Hypercalcemia, Oncology) What is this medicine? ZOLEDRONIC ACID (ZOE le dron ik AS id) slows calcium loss from bones. It high calcium levels in the blood from some kinds of cancer. It may be used in other people at risk for bone loss. This medicine may be used for other purposes; ask your health care provider or pharmacist if you have questions. COMMON BRAND NAME(S): Zometa What should I tell my health care provider before I take this medicine? They need to know if you have any of these conditions:  cancer  dehydration  dental disease  kidney disease  liver disease  low levels of calcium in the blood  lung or breathing disease (asthma)  receiving steroids like dexamethasone or prednisone  an unusual or allergic reaction to zoledronic acid, other medicines, foods, dyes, or preservatives  pregnant or trying  to get pregnant  breast-feeding How should I use this medicine? This drug is injected into a vein. It is given by a health care provider in a hospital or clinic setting. Talk to your health care provider about the use of this drug in children. Special care may be needed. Overdosage: If you think you have taken too much of this medicine contact a poison control center or emergency room at once. NOTE: This medicine is only for you. Do not share this medicine with others. What if I miss a dose? Keep appointments for follow-up doses. It is important not to miss your dose. Call your health care provider if you are unable to keep an appointment. What may interact with this medicine?  certain antibiotics given by injection  NSAIDs, medicines for pain and inflammation, like ibuprofen or naproxen  some diuretics like bumetanide, furosemide  teriparatide  thalidomide This list may not describe all possible interactions. Give your health care provider a list of all the medicines, herbs, non-prescription drugs, or dietary supplements you use. Also tell them if you smoke, drink alcohol, or use illegal drugs. Some items may interact with your medicine. What should I watch for while using this medicine? Visit your health care provider for regular checks on your progress. It may be some time before you see the benefit from this drug. Some people who take this drug have severe bone, joint, or muscle pain. This drug may also increase your risk for jaw problems or a broken thigh bone. Tell your health care provider right away if you have severe pain in your jaw, bones, joints,  or muscles. Tell you health care provider if you have any pain that does not go away or that gets worse. Tell your dentist and dental surgeon that you are taking this drug. You should not have major dental surgery while on this drug. See your dentist to have a dental exam and fix any dental problems before starting this drug. Take good  care of your teeth while on this drug. Make sure you see your dentist for regular follow-up appointments. You should make sure you get enough calcium and vitamin D while you are taking this drug. Discuss the foods you eat and the vitamins you take with your health care provider. Check with your health care provider if you have severe diarrhea, nausea, and vomiting, or if you sweat a lot. The loss of too much body fluid may make it dangerous for you to take this drug. You may need blood work done while you are taking this drug. Do not become pregnant while taking this drug. Women should inform their health care provider if they wish to become pregnant or think they might be pregnant. There is potential for serious harm to an unborn child. Talk to your health care provider for more information. What side effects may I notice from receiving this medicine? Side effects that you should report to your doctor or health care provider as soon as possible:  allergic reactions (skin rash, itching or hives; swelling of the face, lips, or tongue)  bone pain  infection (fever, chills, cough, sore throat, pain or trouble passing urine)  jaw pain, especially after dental work  joint pain  kidney injury (trouble passing urine or change in the amount of urine)  low blood pressure (dizziness; feeling faint or lightheaded, falls; unusually weak or tired)  low calcium levels (fast heartbeat; muscle cramps or pain; pain, tingling, or numbness in the hands or feet; seizures)  low magnesium levels (fast, irregular heartbeat; muscle cramp or pain; muscle weakness; tremors; seizures)  low red blood cell counts (trouble breathing; feeling faint; lightheaded, falls; unusually weak or tired)  muscle pain  redness, blistering, peeling, or loosening of the skin, including inside the mouth  severe diarrhea  swelling of the ankles, feet, hands  trouble breathing Side effects that usually do not require medical  attention (report to your doctor or health care provider if they continue or are bothersome):  anxious  constipation  coughing  depressed mood  eye irritation, itching, or pain  fever  general ill feeling or flu-like symptoms  nausea  pain, redness, or irritation at site where injected  trouble sleeping This list may not describe all possible side effects. Call your doctor for medical advice about side effects. You may report side effects to FDA at 1-800-FDA-1088. Where should I keep my medicine? This drug is given in a hospital or clinic. It will not be stored at home. NOTE: This sheet is a summary. It may not cover all possible information. If you have questions about this medicine, talk to your doctor, pharmacist, or health care provider.  2021 Elsevier/Gold Standard (2019-05-13 09:13:00)

## 2020-11-20 NOTE — Patient Instructions (Signed)
Implanted Port Insertion, Care After This sheet gives you information about how to care for yourself after your procedure. Your health care provider may also give you more specific instructions. If you have problems or questions, contact your health care provider. What can I expect after the procedure? After the procedure, it is common to have:  Discomfort at the port insertion site.  Bruising on the skin over the port. This should improve over 3-4 days. Follow these instructions at home: Port care  After your port is placed, you will get a manufacturer's information card. The card has information about your port. Keep this card with you at all times.  Take care of the port as told by your health care provider. Ask your health care provider if you or a family member can get training for taking care of the port at home. A home health care nurse may also take care of the port.  Make sure to remember what type of port you have. Incision care  Follow instructions from your health care provider about how to take care of your port insertion site. Make sure you: ? Wash your hands with soap and water before and after you change your bandage (dressing). If soap and water are not available, use hand sanitizer. ? Change your dressing as told by your health care provider. ? Leave stitches (sutures), skin glue, or adhesive strips in place. These skin closures may need to stay in place for 2 weeks or longer. If adhesive strip edges start to loosen and curl up, you may trim the loose edges. Do not remove adhesive strips completely unless your health care provider tells you to do that.  Check your port insertion site every day for signs of infection. Check for: ? Redness, swelling, or pain. ? Fluid or blood. ? Warmth. ? Pus or a bad smell.      Activity  Return to your normal activities as told by your health care provider. Ask your health care provider what activities are safe for you.  Do not  lift anything that is heavier than 10 lb (4.5 kg), or the limit that you are told, until your health care provider says that it is safe. General instructions  Take over-the-counter and prescription medicines only as told by your health care provider.  Do not take baths, swim, or use a hot tub until your health care provider approves. Ask your health care provider if you may take showers. You may only be allowed to take sponge baths.  Do not drive for 24 hours if you were given a sedative during your procedure.  Wear a medical alert bracelet in case of an emergency. This will tell any health care providers that you have a port.  Keep all follow-up visits as told by your health care provider. This is important. Contact a health care provider if:  You cannot flush your port with saline as directed, or you cannot draw blood from the port.  You have a fever or chills.  You have redness, swelling, or pain around your port insertion site.  You have fluid or blood coming from your port insertion site.  Your port insertion site feels warm to the touch.  You have pus or a bad smell coming from the port insertion site. Get help right away if:  You have chest pain or shortness of breath.  You have bleeding from your port that you cannot control. Summary  Take care of the port as told by your   health care provider. Keep the manufacturer's information card with you at all times.  Change your dressing as told by your health care provider.  Contact a health care provider if you have a fever or chills or if you have redness, swelling, or pain around your port insertion site.  Keep all follow-up visits as told by your health care provider. This information is not intended to replace advice given to you by your health care provider. Make sure you discuss any questions you have with your health care provider. Document Revised: 02/24/2018 Document Reviewed: 02/24/2018 Elsevier Patient Education   2021 [employer]vier Inc.  

## 2020-11-23 ENCOUNTER — Encounter: Payer: Self-pay | Admitting: Hematology

## 2020-11-27 ENCOUNTER — Inpatient Hospital Stay: Payer: Medicare Other

## 2020-11-27 ENCOUNTER — Other Ambulatory Visit: Payer: Self-pay

## 2020-11-27 VITALS — BP 145/80 | HR 92 | Temp 98.1°F | Resp 20 | Wt 220.8 lb

## 2020-11-27 DIAGNOSIS — C50512 Malignant neoplasm of lower-outer quadrant of left female breast: Secondary | ICD-10-CM | POA: Diagnosis not present

## 2020-11-27 DIAGNOSIS — I5042 Chronic combined systolic (congestive) and diastolic (congestive) heart failure: Secondary | ICD-10-CM | POA: Diagnosis not present

## 2020-11-27 DIAGNOSIS — Z5111 Encounter for antineoplastic chemotherapy: Secondary | ICD-10-CM | POA: Diagnosis not present

## 2020-11-27 DIAGNOSIS — C7989 Secondary malignant neoplasm of other specified sites: Secondary | ICD-10-CM | POA: Diagnosis not present

## 2020-11-27 DIAGNOSIS — Z7189 Other specified counseling: Secondary | ICD-10-CM

## 2020-11-27 DIAGNOSIS — F32A Depression, unspecified: Secondary | ICD-10-CM | POA: Diagnosis not present

## 2020-11-27 DIAGNOSIS — Z171 Estrogen receptor negative status [ER-]: Secondary | ICD-10-CM | POA: Diagnosis not present

## 2020-11-27 DIAGNOSIS — E119 Type 2 diabetes mellitus without complications: Secondary | ICD-10-CM | POA: Diagnosis not present

## 2020-11-27 DIAGNOSIS — D649 Anemia, unspecified: Secondary | ICD-10-CM | POA: Diagnosis not present

## 2020-11-27 DIAGNOSIS — G620 Drug-induced polyneuropathy: Secondary | ICD-10-CM | POA: Diagnosis not present

## 2020-11-27 LAB — CBC WITH DIFFERENTIAL/PLATELET
Abs Immature Granulocytes: 0.31 10*3/uL — ABNORMAL HIGH (ref 0.00–0.07)
Basophils Absolute: 0 10*3/uL (ref 0.0–0.1)
Basophils Relative: 1 %
Eosinophils Absolute: 0.2 10*3/uL (ref 0.0–0.5)
Eosinophils Relative: 3 %
HCT: 28.2 % — ABNORMAL LOW (ref 36.0–46.0)
Hemoglobin: 9.8 g/dL — ABNORMAL LOW (ref 12.0–15.0)
Immature Granulocytes: 5 %
Lymphocytes Relative: 10 %
Lymphs Abs: 0.7 10*3/uL (ref 0.7–4.0)
MCH: 31.4 pg (ref 26.0–34.0)
MCHC: 34.8 g/dL (ref 30.0–36.0)
MCV: 90.4 fL (ref 80.0–100.0)
Monocytes Absolute: 0.5 10*3/uL (ref 0.1–1.0)
Monocytes Relative: 7 %
Neutro Abs: 4.9 10*3/uL (ref 1.7–7.7)
Neutrophils Relative %: 74 %
Platelets: 349 10*3/uL (ref 150–400)
RBC: 3.12 MIL/uL — ABNORMAL LOW (ref 3.87–5.11)
RDW: 16.2 % — ABNORMAL HIGH (ref 11.5–15.5)
WBC: 6.5 10*3/uL (ref 4.0–10.5)
nRBC: 0 % (ref 0.0–0.2)

## 2020-11-27 LAB — COMPREHENSIVE METABOLIC PANEL
ALT: 17 U/L (ref 0–44)
AST: 17 U/L (ref 15–41)
Albumin: 3.6 g/dL (ref 3.5–5.0)
Alkaline Phosphatase: 96 U/L (ref 38–126)
Anion gap: 9 (ref 5–15)
BUN: 12 mg/dL (ref 8–23)
CO2: 25 mmol/L (ref 22–32)
Calcium: 8.7 mg/dL — ABNORMAL LOW (ref 8.9–10.3)
Chloride: 109 mmol/L (ref 98–111)
Creatinine, Ser: 0.75 mg/dL (ref 0.44–1.00)
GFR, Estimated: >60 mL/min (ref >60)
Glucose, Bld: 204 mg/dL — ABNORMAL HIGH (ref 70–99)
Potassium: 3.8 mmol/L (ref 3.5–5.1)
Sodium: 143 mmol/L (ref 135–145)
Total Bilirubin: 0.5 mg/dL (ref 0.3–1.2)
Total Protein: 7.1 g/dL (ref 6.5–8.1)

## 2020-11-27 MED ORDER — SODIUM CHLORIDE 0.9 % IV SOLN
5.0000 mg | Freq: Once | INTRAVENOUS | Status: DC
  Filled 2020-11-27: qty 0.5

## 2020-11-27 MED ORDER — SODIUM CHLORIDE 0.9 % IV SOLN
Freq: Once | INTRAVENOUS | Status: AC
  Filled 2020-11-27: qty 250

## 2020-11-27 MED ORDER — DIPHENHYDRAMINE HCL 50 MG/ML IJ SOLN
25.0000 mg | Freq: Once | INTRAMUSCULAR | Status: AC
Start: 2020-11-27 — End: 2020-11-27
  Administered 2020-11-27: 25 mg via INTRAVENOUS

## 2020-11-27 MED ORDER — SODIUM CHLORIDE 0.9% FLUSH
10.0000 mL | INTRAVENOUS | Status: DC | PRN
  Administered 2020-11-27: 10 mL
  Filled 2020-11-27: qty 10

## 2020-11-27 MED ORDER — HEPARIN SOD (PORK) LOCK FLUSH 100 UNIT/ML IV SOLN
500.0000 [IU] | Freq: Once | INTRAVENOUS | Status: AC | PRN
  Administered 2020-11-27: 500 [IU]
  Filled 2020-11-27: qty 5

## 2020-11-27 MED ORDER — DIPHENHYDRAMINE HCL 50 MG/ML IJ SOLN
INTRAMUSCULAR | Status: AC
  Filled 2020-11-27: qty 1

## 2020-11-27 MED ORDER — DEXAMETHASONE SODIUM PHOSPHATE 10 MG/ML IJ SOLN
5.0000 mg | Freq: Once | INTRAMUSCULAR | Status: AC
  Administered 2020-11-27: 5 mg via INTRAVENOUS

## 2020-11-27 MED ORDER — SODIUM CHLORIDE 0.9 % IV SOLN
60.0000 mg/m2 | Freq: Once | INTRAVENOUS | Status: AC
  Administered 2020-11-27: 132 mg via INTRAVENOUS
  Filled 2020-11-27: qty 22

## 2020-11-27 MED ORDER — FAMOTIDINE IN NACL 20-0.9 MG/50ML-% IV SOLN
INTRAVENOUS | Status: AC
  Filled 2020-11-27: qty 50

## 2020-11-27 MED ORDER — DEXAMETHASONE SODIUM PHOSPHATE 10 MG/ML IJ SOLN
INTRAMUSCULAR | Status: AC
  Filled 2020-11-27: qty 1

## 2020-11-27 MED ORDER — FAMOTIDINE IN NACL 20-0.9 MG/50ML-% IV SOLN
20.0000 mg | Freq: Once | INTRAVENOUS | Status: AC
Start: 2020-11-27 — End: 2020-11-27
  Administered 2020-11-27: 20 mg via INTRAVENOUS

## 2020-11-27 NOTE — Patient Instructions (Signed)
[facility] Discharge Instructions for Patients Receiving Chemotherapy  Today you received the following chemotherapy agents Paclitaxel  To help prevent nausea and vomiting after your treatment, we encourage you to take your nausea medication as directed   If you develop nausea and vomiting that is not controlled by your nausea medication, call the clinic.   BELOW ARE SYMPTOMS THAT SHOULD BE REPORTED IMMEDIATELY:  *FEVER GREATER THAN 100.5 F  *CHILLS WITH OR WITHOUT FEVER  NAUSEA AND VOMITING THAT IS NOT CONTROLLED WITH YOUR NAUSEA MEDICATION  *UNUSUAL SHORTNESS OF BREATH  *UNUSUAL BRUISING OR BLEEDING  TENDERNESS IN MOUTH AND THROAT WITH OR WITHOUT PRESENCE OF ULCERS  *URINARY PROBLEMS  *BOWEL PROBLEMS  UNUSUAL RASH Items with * indicate a potential emergency and should be followed up as soon as possible.  Feel free to call the clinic should you have any questions or concerns. The clinic phone number is [phone].  Please show the CHEMO ALERT CARD at check-in to the Emergency Department and triage nurse.   

## 2020-11-27 NOTE — Progress Notes (Signed)
Patient reported increased neuropathy over the weekend. After patient iced hands and feet she felt neuropathy was back to how it has been previously. Dr. Burr Medico out of office today and in basket message sent to her. Sandi Mealy, PA notified as well today. No further orders received. Will continue to monitor.

## 2020-12-04 ENCOUNTER — Ambulatory Visit: Payer: Medicare Other

## 2020-12-04 ENCOUNTER — Other Ambulatory Visit: Payer: Medicare Other

## 2020-12-04 ENCOUNTER — Ambulatory Visit: Payer: Medicare Other | Admitting: Hematology

## 2020-12-06 NOTE — Progress Notes (Signed)
[place]   [phone] [phone]   Clinic Follow up Note   Patient Care Team: Girtha Rm, NP-C as PCP - General (Family Medicine) [name], MD as PCP - Cardiology (Cardiology) [name], MD as Consulting Physician (Hematology) [name], MD as Consulting Physician (General Surgery)  Date of Service:  12/11/2020  CHIEF COMPLAINT: F/u ofmetastaticleft breastcancer  SUMMARY OF ONCOLOGIC HISTORY: Oncology History Overview Note  Cancer Staging Malignant neoplasm of lower-outer quadrant of left breast of female, estrogen receptor negative ([place]) Staging form: Breast, AJCC 8th Edition - Clinical stage from 06/06/2017: Stage IB (cT1c, cN0, cM0, G3, ER: Negative, PR: Negative, HER2: Negative) - Signed by [name], MD on 06/15/2017 - Pathologic stage from 06/26/2017: Stage IB (pT1c, pN0, cM0, G3, ER: Negative, PR: Negative, HER2: Negative) - Signed by [name], MD on 07/10/2017     Malignant neoplasm of lower-outer quadrant of left breast of female, estrogen receptor negative ([place])  05/30/2017 Mammogram   Diagnostic mammo and US IMPRESSION: 1. Highly suspicious 1.4 cm mass in the slightly lower slightly outer left breast -tissue sampling recommended. 2. Indeterminate 0.5 mm mass in the slightly lower slightly outer left breast -tissue sampling recommended. 3. At least 2 left axillary lymph nodes with borderline cortical thickness.   06/06/2017 Initial Biopsy   Diagnosis 1. Breast, left, needle core biopsy, 5:30 o'clock - INVASIVE DUCTAL CARCINOMA, G3 2. Lymph node, needle/core biopsy, left axillary - NO CARCINOMA IDENTIFIED IN ONE LYMPH NODE (0/1)   06/06/2017 Initial Diagnosis   Malignant neoplasm of lower-outer quadrant of left breast of female, estrogen receptor negative ([place])   06/06/2017 Receptors her2   Estrogen Receptor: 0%, NEGATIVE Progesterone Receptor: 0%, NEGATIVE Proliferation Marker Ki67: 70%    06/26/2017 Surgery   LEFT BREAST LUMPECTOMY WITH RADIOACTIVE SEED AND SENTINEL LYMPH NODE BIOPSY ERAS  PATHWAY AND INSERTION PORT-A-CATH By Dr. Barry Dienes on 06/26/17    06/26/2017 Pathology Results   Diagnosis 06/26/17  1. Breast, lumpectomy, Left - INVASIVE DUCTAL CARCINOMA, GRADE III/III, SPANNING 1.2 CM. - THE SURGICAL RESECTION MARGINS ARE NEGATIVE FOR CARCINOMA. - SEE ONCOLOGY TABLE BELOW. 2. Lymph node, sentinel, biopsy, Left axillary #1 - THERE IS NO EVIDENCE OF CARCINOMA IN 1 OF 1 LYMPH NODE (0/1). 3. Lymph node, sentinel, biopsy, Left axillary #2 - THERE IS NO EVIDENCE OF CARCINOMA IN 1 OF 1 LYMPH NODE (0/1). 4. Lymph node, sentinel, biopsy, Left axillary #3 - THERE IS NO EVIDENCE OF CARCINOMA IN 1 OF 1 LYMPH NODE (0/1).    07/25/2017 - 09/29/2017 Chemotherapy   Adjuvant cytoxan and docetaxel (TC) every 3 weekd for 4 cycles     11/19/2017 - 12/17/2017 Radiation Therapy   Adjuvant breast radiation Left breast treated to 42.5 Gy with 17 fx of 2.5 Gy followed by a boost of 7.5 Gy with 3 fx of 2.5 Gy     02/2019 Procedure   She had PAC removal in 02/2019.    07/23/2020 Imaging   MRI Lumbar Spine 07/23/20 IMPRESSION: 1. No significant disc herniation, spinal canal or neural foraminal stenosis at any level. 2. Moderate facet degenerative changes at L3-4, L4-5 and L5-S1. 3. Multiple enlarged retroperitoneal and right iliac lymph nodes. Recommend correlation with CT of the abdomen and pelvis with contrast.   08/15/2020 Imaging   CT AP 08/15/20  IMPRESSION: 1. Right iliac and periaortic adenopathy is noted concerning for metastatic disease or lymphoma. Also noted is abnormal soft tissue mass anterior and posterior to the right iliac  wing concerning for malignancy or metastatic disease. MRI is recommended for further evaluation. 2. Irregular lucency with sclerotic margins is seen involving the right iliac crest concerning for possible lytic lesion. MRI may be performed for  further evaluation. 3. Enlarged fibroid uterus. 4. Small gallstone. 5. Aortic atherosclerosis.   08/22/2020 Pathology Results   FINAL MICROSCOPIC DIAGNOSIS: 08/22/20  A. NEEDLE CORE, RIGHT GLUTEUS MINIMUMUS, BIOPSY:  -  Metastatic carcinoma  -  See comment   COMMENT:   By immunohistochemistry, the neoplastic cells are positive for  cytokeratin-7 and have weak patchy positivity for GATA3 but are negative  for cytokeratin-20 and GCDFP.  The combined morphology and  immunophenotype, support metastasis from the patient's known breast  primary carcinoma.  Dr. Burr Medico was notified of these results on 08/24/2020.  Prognostic markers (ER, PR, Her2, PDL-1) are pending and will be  reported in an addendum   ADDENDUM:   PROGNOSTIC INDICATOR RESULTS:   Immunohistochemical and morphometric analysis performed manually   The tumor cells are NEGATIVE for Her2 (0).   Estrogen Receptor:       POSITIVE, 20%, WEAK STAINING  Progesterone Receptor:   NEGATIVE    PD-L1 (0) negative    09/04/2020 - 09/22/2020 Radiation Therapy   Palliative radiation to right iliac 09/04/20 - 09/22/20, Dr. Lisbeth Renshaw   09/26/2020 -  Chemotherapy   first line weekly Taxol 3 weeks on/1 week off starting 09/26/20    10/13/2020 Procedure    PAC placement on 10/13/20.       CURRENT THERAPY:  -first line weekly Taxol3 weeks on/1 week offstarting 09/26/20, will givefilgrastimas needed.Reduced to 2 weeks on/1 week off starting with C3 due to neuropathy.  -Zometa q3month starting 11/20/20   INTERVAL HISTORY:  Kristin Ward is here for a follow up. She was last seen by me 11/20/20. She presents to the clinic alone. She notes she is doing well. She notes her neuropathy is manageable. She has numbness in her feet and light tingling in her hands. She is not able to walk her 17 miles like before. She notes she is not interested in Gabapentin at this time. She continues to use ice bags as needed. She notes this has helped her  tingling. I reviewed her medication list with her.     REVIEW OF SYSTEMS:   Constitutional: Denies fevers, chills or abnormal weight loss Eyes: Denies blurriness of vision Ears, nose, mouth, throat, and face: Denies mucositis or sore throat Respiratory: Denies cough, dyspnea or wheezes Cardiovascular: Denies palpitation, chest discomfort or lower extremity swelling Gastrointestinal:  Denies nausea, heartburn or change in bowel habits Skin: Denies abnormal skin rashes Lymphatics: Denies new lymphadenopathy or easy bruising Neurological: (+) Numbness in her hands (+) Light tingling in her hands  Behavioral/Psych: Mood is stable, no new changes  All other systems were reviewed with the patient and are negative.  MEDICAL HISTORY:  Past Medical History:  Diagnosis Date  . Abnormal x-ray of lungs with single pulmonary nodule 03/15/2016   [place]  . Anemia   . Asthma   . Cholelithiasis 05/19/2017   On CT  . Coronary artery calcification seen on CAT scan 05/19/2017  . Diabetes mellitus without complication ([place]    type 2  . [place] 12/2015   EF 15-20%. [facility] . Headache    NONE RECENT  . Hypertension   . Lymph nodes enlarged 07/25/2020  . Malignant neoplasm of lower-outer quadrant of left breast  of female, estrogen receptor negative ([place]) 06/11/2017   left breast  . Mixed hyperlipidemia 06/17/2018  . partially blind   . Personal history of chemotherapy 2018-2019  . Personal history of radiation therapy 2019  . Retinitis pigmentosa   . Retroperitoneal lymphadenopathy 07/25/2020  . Uveitis     SURGICAL HISTORY: Past Surgical History:  Procedure Laterality Date  . brain cyst removed  2007   to help with headaches per patient , aspirated   . BREAST BIOPSY Left 06/06/2017   x2  . BREAST CYST ASPIRATION Left 06/06/2017  . BREAST LUMPECTOMY Left 06/26/2017  . BREAST LUMPECTOMY WITH  RADIOACTIVE SEED AND SENTINEL LYMPH NODE BIOPSY Left 06/26/2017   Procedure: BREAST LUMPECTOMY WITH RADIOACTIVE SEED AND SENTINEL LYMPH NODE BIOPSY ERAS  PATHWAY;  Surgeon: [name], MD;  Location: [place];  Service: General;  Laterality: Left;  pec block  . FINE NEEDLE ASPIRATION Left 02/23/2019   Procedure: FINE NEEDLE ASPIRATION;  Surgeon: [name], MD;  Location: [place];  Service: General;  Laterality: Left;  Asiration of left breast seroma   . IR IMAGING GUIDED PORT INSERTION  10/13/2020  . NASAL TURBINATE REDUCTION    . PORT-A-CATH REMOVAL Right 02/23/2019   Procedure: REMOVAL PORT-A-CATH;  Surgeon: [name], MD;  Location: [place];  Service: General;  Laterality: Right;  . PORTACATH PLACEMENT N/A 06/26/2017   Procedure: INSERTION PORT-A-CATH;  Surgeon: [name], MD;  Location: [place];  Service: General;  Laterality: N/A;  . TRANSTHORACIC ECHOCARDIOGRAM  12/2015   A) [facility] May 2017: Mild concentric LVH. Global hypokinesis. GR daily. EF 18% severe LA dilation. Mitral annular dilatation with papillary muscle dysfunction and moderate MR. Dilated IVC consistent with elevated RAP.  [facility] TRANSTHORACIC ECHOCARDIOGRAM  05/2017; 08/2017   a) Mild concentric LVH.  EF 45 to 50% with diffuse hypokinesis.  GR 1 DD.  Mild aortic root dilation.;; b)  Mild LVH EF 45 to 50%.  Diffuse HK.  No significant valve disease.  No significant change.    I have reviewed the social history and family history with the patient and they are unchanged from previous note.  ALLERGIES:  is allergic to morphine and related, latex, tylenol with codeine #3 [acetaminophen-codeine], and penicillins.  MEDICATIONS:  Current Outpatient Medications  Medication Sig Dispense Refill  . aspirin 81 MG chewable tablet Chew 81 mg by mouth daily.    . carvedilol (COREG) 12.5 MG tablet Take 1 tablet (12.5 mg total) by mouth 2 (two) times daily with a meal. 180 tablet 1  .  Cholecalciferol (D3-1000 PO) Take by mouth.    . diclofenac (VOLTAREN) 75 MG EC tablet Take 1 tablet (75 mg total) by mouth 2 (two) times daily. 60 tablet 1  . glucose blood (ACCU-CHEK GUIDE) test strip Use as instructed to test blood sugar 2 times a day 100 strip 2  . ibuprofen (ADVIL) 800 MG tablet Take 1 tablet (800 mg total) by mouth every 8 (eight) hours as needed. 30 tablet 2  . lidocaine-prilocaine (EMLA) cream Apply 1 application topically as needed. 30 g 0  . linaclotide (LINZESS) 72 MCG capsule Take 1 capsule (72 mcg total) by mouth daily before breakfast. 30 capsule 1  . Multiple Vitamins-Minerals (WOMENS MULTI PO) Take by mouth.    . ondansetron (ZOFRAN) 8 MG tablet Take 1 tablet (8 mg total) by mouth every 8 (eight) hours as needed for nausea or vomiting. 20 tablet 0  . ondansetron (ZOFRAN) 8 MG  tablet Take 1 tablet (8 mg total) by mouth 2 (two) times daily as needed (Nausea or vomiting). 30 tablet 1  . prochlorperazine (COMPAZINE) 10 MG tablet Take 1 tablet (10 mg total) by mouth every 6 (six) hours as needed (Nausea or vomiting). 30 tablet 1  . Saccharomyces boulardii (PROBIOTIC) 250 MG CAPS Take by mouth.    . sacubitril-valsartan (ENTRESTO) 97-103 MG Take 1 tablet by mouth 2 (two) times daily. 180 tablet 3  . spironolactone (ALDACTONE) 25 MG tablet Take 1 tablet (25 mg total) by mouth daily. 90 tablet 3  . vitamin B-12 (CYANOCOBALAMIN) 500 MCG tablet Take 500 mcg by mouth daily. (Patient not taking: No sig reported)    . vitamin C (ASCORBIC ACID) 500 MG tablet Take 1,000 mg 2 (two) times daily by mouth.     No current facility-administered medications for this visit.    PHYSICAL EXAMINATION: ECOG PERFORMANCE STATUS: 2 - Symptomatic, <50% confined to bed  Vitals:   12/11/20 1326  BP: (!) 152/93  Pulse: 100  Resp: 19  Temp: 98 F (36.7 C)  SpO2: 100%   Filed Weights   12/11/20 1326  Weight: 234 lb 3.2 oz (106.2 kg)    GENERAL:alert, no distress and  comfortable SKIN: skin color, texture, turgor are normal, no rashes or significant lesions EYES: normal, Conjunctiva are pink and non-injected, sclera clear  NECK: supple, thyroid normal size, non-tender, without nodularity LYMPH:  no palpable lymphadenopathy in the cervical, axillary  LUNGS: clear to auscultation and percussion with normal breathing effort HEART: regular rate & rhythm and no murmurs and no lower extremity edema ABDOMEN:abdomen soft, non-tender and normal bowel sounds Musculoskeletal:no cyanosis of digits and no clubbing  NEURO: alert & oriented x 3 with fluent speech, no focal motor/sensory deficits BREAST:S/p left lumpectomy: Surgical incision healed well (+) Known inferior left breast benign mass now 5x3cm (previously 4x4cm and 5.5x3cm before that) in 6:00 position. RightBreast exam benign.   LABORATORY DATA:  I have reviewed the data as listed CBC Latest Ref Rng & Units 12/11/2020 11/27/2020 11/20/2020  WBC 4.0 - 10.5 K/uL 4.3 6.5 4.2  Hemoglobin 12.0 - 15.0 g/dL 10.3(L) 9.8(L) 9.9(L)  Hematocrit 36.0 - 46.0 % 31.4(L) 28.2(L) 30.5(L)  Platelets 150 - 400 K/uL 376 349 340     CMP Latest Ref Rng & Units 12/11/2020 11/27/2020 11/20/2020  Glucose 70 - 99 mg/dL 245(H) 204(H) 197(H)  BUN 8 - 23 mg/dL _0 Creatinine 0.44 - 1.00 mg/dL 0.89 0.75 0.88  Sodium 135 - 145 mmol/L 140 143 143  Potassium 3.5 - 5.1 mmol/L 4.0 3.8 4.0  Chloride 98 - 111 mmol/L 106 109 109  CO2 22 - 32 mmol/L _1 Calcium 8.9 - 10.3 mg/dL 9.3 8.7(L) 9.2  Total Protein 6.5 - 8.1 g/dL 7.2 7.1 7.0  Total Bilirubin 0.3 - 1.2 mg/dL 0.4 0.5 0.5  Alkaline Phos 38 - 126 U/L 96 96 98  AST 15 - 41 U/L _2 ALT 0 - 44 U/L _3 RADIOGRAPHIC STUDIES: I have personally reviewed the radiological images as listed and agreed with the findings in the report. No results found.   ASSESSMENT & PLAN:  Kristin Ward is a 65 y.o. female with   1. Left breast invasive ductal  carcinoma, stage IB, p(T1cN0M0), Triple negative, Grade 3, in 2018, metastatic disease to right gluteal mass/iliac and diffuse node metastasis in 08/2020 ER 20% weakly + PR/HER2 (  0) negative, PD-L1 0% -She was initially diagnosed in 05/2017.She has high risk for recurrence due to triple negative disease. She is s/p left breast lumpectomy, adjuvant chemo TC and Radiation. -Genetictesting was negative for pathogenetic mutations. -Due to recent right gluteal pain, she underwent biopsy on 09/02/20 which showed metastatic carcinoma, consistent with breast primary. ER 20% positive, ER and HER2 negative.  -I discussed with stage IV metastatic breast cancer, her cancer is no longer curable but still treatable. Goal of therapy is palliative to control her disease and prolong her life. -I started her onfirst-line chemotherapy with weekly Taxolon 2/15/22for 3 weeks on/ 1 week off.I discussed if she does well on chemo for4-6 months, we can change treatment to antiestrogen therapy without chemo, given her ER weekly positivediease. Plan for next scan in May. -Taxol reduced to 2 weeks on/1 week off given her neuropathy starting with C3. May stop Taxol sooner if neuropathy gets worse and switch to oral Xeloda.  -She continues to have moderate numbness in her feet and light tingling in her hands. She is able to manage so far with cryotherapy as needed, but her neuropathy has slightly limited her physical activities. Will continue to monitor closely.  -Labs reviewed and stable. Overall adequate to proceed with C4D1 Taxol with dose reduction today -Proceed with day 8 next week.  -F/u in 3 weeks with restaging CT and bone scan  -plan to change treatment to oral Xeloda on next visit   2. G2peripheralNeuropathy  -S/p C2D8 Taxol she developed mild tingling in her hands and feet.  -She uses ice bags at home as needed.  -She continues to have moderate numbness in her hands and light tingling in her hands. Her  function and ambulation has decreased. I recommend PT to help her balance. She is agreeable. -Taxol dose was reduced twice. I plan to stop her Taxol soon. Plan to switch Toxel to Xeloda.   3.Right hip pain, Right iliac bone met, Vit D deficiency -She notes she had recurrent right hip pain since 2020thatradiateddown her right hip and buttock pain. -Her right hip pain is caused bythe large infiltrative mass around the right iliac wing, Secondary to metastatic disease.  -ShecompletedPalliative radiation to right iliac 09/04/20 - 09/22/20 with Dr. Lisbeth Renshaw, and her pain is much improved. -She mostly has soreness in her right hip. She ambulates with cane. She recently has not been on Oxycodone.  -Given recent decreased Vit D level, she can continue Vit D 2000-5000 units. I also recommend she take calcium  -Given localized bone met, I started her on Zometa q54month on 11/20/20  to strengthen her bones and treatment for her bone mets..   4. HTN,DM,COPD, retinitis pigmentosa with limited vision,Chronic combined systolic and diastolic heart failure, EF 45-50% -Continue to follow-up withPCP Dr HForest BeckercardiologistDr HEllyn Hack-She is legally blind -She notes previously being seen by endocrinologist who had her on DM medication and insulin. Patient stopped medication and seeing her after BG decreased. I will check her A1c with next labs.  5. Anemia -She developed anemia during adjuvant chemotherapy, which resolved after she completed chemo -She had recurrent mild anemia with Hgb 11.7 in 01/2019.Labs on 04/02/2019 showed normal iron, ferritin, folate, and MMA -Remains mild and intermittent.  6.Goal of care discussion  -The patient understands the goal of care is palliative. -she is full code now  7. Financial and social support  -She notes she was getting bills from [place]. I discussed getting more help with our financial advocate.  -  She has had episode of deep depression since  her metastatic cancer diagnosis. I encouraged her to f/u with SW for counseling. She is interested.   Plan -Lab reviewed,and adequate to proceed with C4D1Taxol today at reduced dose.  -Flush and Taxol next week  -F/u in 3 weeks with lab, flush and CT CAP w contrast and bone scan a few days before.  -Send PT referral for neuropathy    No problem-specific Assessment & Plan notes found for this encounter.   Orders Placed This Encounter  Procedures  . Ambulatory referral to Physical Therapy    Referral Priority:   Routine    Referral Type:   Physical Medicine    Referral Reason:   Specialty Services Required    Requested Specialty:   Physical Therapy    Number of Visits Requested:   1   All questions were answered. The patient knows to call the clinic with any problems, questions or concerns. No barriers to learning was detected. The total time spent in the appointment was 30 minutes.     [name], MD 12/11/2020   I, Joslyn Devon, am acting as scribe for [name], MD.   I have reviewed the above documentation for accuracy and completeness, and I agree with the above.

## 2020-12-08 DIAGNOSIS — B351 Tinea unguium: Secondary | ICD-10-CM | POA: Diagnosis not present

## 2020-12-08 DIAGNOSIS — L84 Corns and callosities: Secondary | ICD-10-CM | POA: Diagnosis not present

## 2020-12-08 DIAGNOSIS — G620 Drug-induced polyneuropathy: Secondary | ICD-10-CM | POA: Diagnosis not present

## 2020-12-08 DIAGNOSIS — L603 Nail dystrophy: Secondary | ICD-10-CM | POA: Diagnosis not present

## 2020-12-11 ENCOUNTER — Other Ambulatory Visit: Payer: Self-pay

## 2020-12-11 ENCOUNTER — Inpatient Hospital Stay: Payer: Medicare Other

## 2020-12-11 ENCOUNTER — Inpatient Hospital Stay: Payer: Medicare Other | Attending: Hematology

## 2020-12-11 ENCOUNTER — Encounter: Payer: Self-pay | Admitting: Hematology

## 2020-12-11 ENCOUNTER — Inpatient Hospital Stay (HOSPITAL_BASED_OUTPATIENT_CLINIC_OR_DEPARTMENT_OTHER): Payer: Medicare Other | Admitting: Hematology

## 2020-12-11 VITALS — BP 152/93 | HR 100 | Temp 98.0°F | Resp 19 | Wt 234.2 lb

## 2020-12-11 DIAGNOSIS — I10 Essential (primary) hypertension: Secondary | ICD-10-CM | POA: Diagnosis not present

## 2020-12-11 DIAGNOSIS — Z5111 Encounter for antineoplastic chemotherapy: Secondary | ICD-10-CM | POA: Diagnosis not present

## 2020-12-11 DIAGNOSIS — Z95828 Presence of other vascular implants and grafts: Secondary | ICD-10-CM

## 2020-12-11 DIAGNOSIS — C7951 Secondary malignant neoplasm of bone: Secondary | ICD-10-CM | POA: Insufficient documentation

## 2020-12-11 DIAGNOSIS — E119 Type 2 diabetes mellitus without complications: Secondary | ICD-10-CM | POA: Insufficient documentation

## 2020-12-11 DIAGNOSIS — Z171 Estrogen receptor negative status [ER-]: Secondary | ICD-10-CM | POA: Diagnosis not present

## 2020-12-11 DIAGNOSIS — G620 Drug-induced polyneuropathy: Secondary | ICD-10-CM | POA: Diagnosis not present

## 2020-12-11 DIAGNOSIS — I5042 Chronic combined systolic (congestive) and diastolic (congestive) heart failure: Secondary | ICD-10-CM | POA: Diagnosis not present

## 2020-12-11 DIAGNOSIS — C50512 Malignant neoplasm of lower-outer quadrant of left female breast: Secondary | ICD-10-CM | POA: Insufficient documentation

## 2020-12-11 DIAGNOSIS — Z7189 Other specified counseling: Secondary | ICD-10-CM

## 2020-12-11 DIAGNOSIS — Z452 Encounter for adjustment and management of vascular access device: Secondary | ICD-10-CM | POA: Diagnosis not present

## 2020-12-11 DIAGNOSIS — D649 Anemia, unspecified: Secondary | ICD-10-CM | POA: Insufficient documentation

## 2020-12-11 LAB — COMPREHENSIVE METABOLIC PANEL
ALT: 16 U/L (ref 0–44)
AST: 22 U/L (ref 15–41)
Albumin: 3.7 g/dL (ref 3.5–5.0)
Alkaline Phosphatase: 96 U/L (ref 38–126)
Anion gap: 8 (ref 5–15)
BUN: 11 mg/dL (ref 8–23)
CO2: 26 mmol/L (ref 22–32)
Calcium: 9.3 mg/dL (ref 8.9–10.3)
Chloride: 106 mmol/L (ref 98–111)
Creatinine, Ser: 0.89 mg/dL (ref 0.44–1.00)
GFR, Estimated: >60 mL/min (ref >60)
Glucose, Bld: 245 mg/dL — ABNORMAL HIGH (ref 70–99)
Potassium: 4 mmol/L (ref 3.5–5.1)
Sodium: 140 mmol/L (ref 135–145)
Total Bilirubin: 0.4 mg/dL (ref 0.3–1.2)
Total Protein: 7.2 g/dL (ref 6.5–8.1)

## 2020-12-11 LAB — CBC WITH DIFFERENTIAL/PLATELET
Abs Immature Granulocytes: 0.07 10*3/uL (ref 0.00–0.07)
Basophils Absolute: 0 10*3/uL (ref 0.0–0.1)
Basophils Relative: 1 %
Eosinophils Absolute: 0.1 10*3/uL (ref 0.0–0.5)
Eosinophils Relative: 2 %
HCT: 31.4 % — ABNORMAL LOW (ref 36.0–46.0)
Hemoglobin: 10.3 g/dL — ABNORMAL LOW (ref 12.0–15.0)
Immature Granulocytes: 2 %
Lymphocytes Relative: 16 %
Lymphs Abs: 0.7 10*3/uL (ref 0.7–4.0)
MCH: 30.5 pg (ref 26.0–34.0)
MCHC: 32.8 g/dL (ref 30.0–36.0)
MCV: 92.9 fL (ref 80.0–100.0)
Monocytes Absolute: 0.5 10*3/uL (ref 0.1–1.0)
Monocytes Relative: 12 %
Neutro Abs: 2.9 10*3/uL (ref 1.7–7.7)
Neutrophils Relative %: 67 %
Platelets: 376 10*3/uL (ref 150–400)
RBC: 3.38 MIL/uL — ABNORMAL LOW (ref 3.87–5.11)
RDW: 15.7 % — ABNORMAL HIGH (ref 11.5–15.5)
WBC: 4.3 10*3/uL (ref 4.0–10.5)
nRBC: 0 % (ref 0.0–0.2)

## 2020-12-11 MED ORDER — FAMOTIDINE IN NACL 20-0.9 MG/50ML-% IV SOLN
20.0000 mg | Freq: Once | INTRAVENOUS | Status: AC
  Administered 2020-12-11: 20 mg via INTRAVENOUS

## 2020-12-11 MED ORDER — DIPHENHYDRAMINE HCL 50 MG/ML IJ SOLN
INTRAMUSCULAR | Status: AC
  Filled 2020-12-11: qty 1

## 2020-12-11 MED ORDER — DEXAMETHASONE SODIUM PHOSPHATE 10 MG/ML IJ SOLN
INTRAMUSCULAR | Status: AC
  Filled 2020-12-11: qty 1

## 2020-12-11 MED ORDER — CAPECITABINE 500 MG PO TABS
ORAL_TABLET | ORAL | 0 refills | Status: DC
  Filled 2020-12-11: qty 98, fill #0

## 2020-12-11 MED ORDER — HEPARIN SOD (PORK) LOCK FLUSH 100 UNIT/ML IV SOLN
500.0000 [IU] | Freq: Once | INTRAVENOUS | Status: AC | PRN
  Administered 2020-12-11: 500 [IU]
  Filled 2020-12-11: qty 5

## 2020-12-11 MED ORDER — DEXAMETHASONE SODIUM PHOSPHATE 10 MG/ML IJ SOLN
5.0000 mg | Freq: Once | INTRAMUSCULAR | Status: AC
  Administered 2020-12-11: 5 mg via INTRAVENOUS

## 2020-12-11 MED ORDER — COLD PACK MISC ONCOLOGY
1.0000 | Freq: Once | Status: DC | PRN
  Filled 2020-12-11: qty 1

## 2020-12-11 MED ORDER — DIPHENHYDRAMINE HCL 50 MG/ML IJ SOLN
25.0000 mg | Freq: Once | INTRAMUSCULAR | Status: AC
  Administered 2020-12-11: 25 mg via INTRAVENOUS

## 2020-12-11 MED ORDER — PACLITAXEL CHEMO INJECTION 300 MG/50ML
50.0000 mg/m2 | Freq: Once | INTRAVENOUS | Status: AC
  Administered 2020-12-11: 108 mg via INTRAVENOUS
  Filled 2020-12-11: qty 18

## 2020-12-11 MED ORDER — SODIUM CHLORIDE 0.9% FLUSH
10.0000 mL | INTRAVENOUS | Status: DC | PRN
  Administered 2020-12-11: 10 mL
  Filled 2020-12-11: qty 10

## 2020-12-11 MED ORDER — SODIUM CHLORIDE 0.9% FLUSH
10.0000 mL | INTRAVENOUS | Status: DC | PRN
  Administered 2020-12-11: 10 mL via INTRAVENOUS
  Filled 2020-12-11: qty 10

## 2020-12-11 MED ORDER — SODIUM CHLORIDE 0.9 % IV SOLN
Freq: Once | INTRAVENOUS | Status: AC
  Filled 2020-12-11: qty 250

## 2020-12-11 MED ORDER — FAMOTIDINE IN NACL 20-0.9 MG/50ML-% IV SOLN
INTRAVENOUS | Status: AC
  Filled 2020-12-11: qty 50

## 2020-12-11 NOTE — Patient Instructions (Signed)
[facility] MEDICAL [facility]COLOGY  Discharge Instructions: °Thank you for choosing [facility] to provide your oncology and hematology care.  ° °If you have a lab appointment with the Cancer Center, please go directly to the Cancer Center and check in at the registration area. °  °Wear comfortable clothing and clothing appropriate for easy access to any Portacath or PICC line.  ° °We strive to give you quality time with your provider. You may need to reschedule your appointment if you arrive late (15 or more minutes).  Arriving late affects you and other patients whose appointments are after yours.  Also, if you miss three or more appointments without notifying the office, you may be dismissed from the clinic at the provider’s discretion.    °  °For prescription refill requests, have your pharmacy contact our office and allow 72 hours for refills to be completed.   ° °Today you received the following chemotherapy and/or immunotherapy agents: Taxol   °  °To help prevent nausea and vomiting after your treatment, we encourage you to take your nausea medication as directed. ° °BELOW ARE SYMPTOMS THAT SHOULD BE REPORTED IMMEDIATELY: °*FEVER GREATER THAN 100.4 F (38 °C) OR HIGHER °*CHILLS OR SWEATING °*NAUSEA AND VOMITING THAT IS NOT CONTROLLED WITH YOUR NAUSEA MEDICATION °*UNUSUAL SHORTNESS OF BREATH °*UNUSUAL BRUISING OR BLEEDING °*URINARY PROBLEMS (pain or burning when urinating, or frequent urination) °*BOWEL PROBLEMS (unusual diarrhea, constipation, pain near the anus) °TENDERNESS IN MOUTH AND THROAT WITH OR WITHOUT PRESENCE OF ULCERS (sore throat, sores in mouth, or a toothache) °UNUSUAL RASH, SWELLING OR PAIN  °UNUSUAL VAGINAL DISCHARGE OR ITCHING  ° °Items with * indicate a potential emergency and should be followed up as soon as possible or go to the Emergency Department if any problems should occur. ° °Please show the CHEMOTHERAPY ALERT CARD or IMMUNOTHERAPY ALERT CARD at check-in to the  Emergency Department and triage nurse. ° °Should you have questions after your visit or need to cancel or reschedule your appointment, please contact [facility]ICAL [facility]COL[facility]  Dept: [phone]  and follow the prompts.  Office hours are 8:00 a.m. to 4:30 p.m. Monday - Friday. Please note that voicemails left after 4:00 p.m. may not be returned until the following business day.  We are closed weekends and major holidays. You have access to a nurse at all times for urgent questions. Please call the main number to the clinic Dept: [phone] and follow the prompts. ° ° °For any non-urgent questions, you may also contact your provider using MyChart. We now offer e-Visits for anyone 18 and older to request care online for non-urgent symptoms. For details visit [email or web address]. °  °Also download the MyChart app! Go to the app store, search "MyChart", open the app, select [facility], and log in with your MyChart username and password. ° °Due to Covid, a mask is required upon entering the hospital/clinic. If you do not have a mask, one will be given to you upon arrival. For doctor visits, patients may have 1 support person aged 18 or older with them. For treatment visits, patients cannot have anyone with them due to current Covid guidelines and our immunocompromised population.  ° °

## 2020-12-12 ENCOUNTER — Telehealth: Payer: Self-pay

## 2020-12-12 ENCOUNTER — Other Ambulatory Visit (HOSPITAL_COMMUNITY): Payer: Self-pay

## 2020-12-12 ENCOUNTER — Telehealth: Payer: Self-pay | Admitting: Pharmacist

## 2020-12-12 DIAGNOSIS — C50512 Malignant neoplasm of lower-outer quadrant of left female breast: Secondary | ICD-10-CM

## 2020-12-12 DIAGNOSIS — Z171 Estrogen receptor negative status [ER-]: Secondary | ICD-10-CM

## 2020-12-12 NOTE — Telephone Encounter (Signed)
Oral Oncology Pharmacist Encounter  Received new prescription for Xeloda (capecitabine) for the treatment of metastatic breast cancer ER positive, PR negative, HER-2 negative, planned duration until disease progression or unacceptable drug toxicity.  Prescription dose and frequency assessed for appropriateness. Dose reduced for 1st cycle per MD.   CMP and CBC w/ Diff from 12/11/20 assessed, labs OK for treatment initiation.  Current medication list in Epic reviewed, DDIs with Xeloda identified:  Category B DDI due to risk of QTc prolongation between Xeloda and ondansetron. Noted patient only prescribed ondansetron PRN. If patient does experience nausea while on Xeloda recommend utilizing prochlorperazine PRN instead. No change in therapy warranted.   Evaluated chart and no patient barriers to medication adherence noted.    Prescription has been e-scribed to the [facility] for benefits analysis and approval.  Oral Oncology Clinic will continue to follow for insurance authorization, copayment issues, initial counseling and start date.  Leron Croak, PharmD, BCPS Hematology/Oncology Clinical Pharmacist [facility] [phone] 12/12/2020 8:48 AM

## 2020-12-12 NOTE — Telephone Encounter (Signed)
Oral Oncology Patient Advocate Encounter  After completing a benefits investigation, prior authorization for Xeloda is not required at this time through [employer].  Patient's copay is $35.69.     [place] [facility] [place] Phone [phone] Fax [phone] 12/12/2020 8:33 AM

## 2020-12-13 ENCOUNTER — Telehealth: Payer: Self-pay | Admitting: Hematology

## 2020-12-13 NOTE — Telephone Encounter (Signed)
Scheduled follow-up appointment per 5/2 los. Patient is aware. ?

## 2020-12-15 ENCOUNTER — Telehealth: Payer: Self-pay

## 2020-12-15 NOTE — Telephone Encounter (Signed)
I called the patient to touch base with her about her upcoming scans scheduled for 5/17. The patient was made aware of the appointment date/time and to arrive to [facility] on 5/17 by 11:45 am. Patient was advised to contact us if she had any further questions or concerns.

## 2020-12-18 ENCOUNTER — Inpatient Hospital Stay: Payer: Medicare Other

## 2020-12-18 ENCOUNTER — Other Ambulatory Visit: Payer: Self-pay

## 2020-12-18 ENCOUNTER — Other Ambulatory Visit: Payer: Medicare Other

## 2020-12-18 VITALS — BP 157/84 | HR 95 | Temp 98.0°F | Resp 20 | Wt 223.1 lb

## 2020-12-18 DIAGNOSIS — Z171 Estrogen receptor negative status [ER-]: Secondary | ICD-10-CM

## 2020-12-18 DIAGNOSIS — C50512 Malignant neoplasm of lower-outer quadrant of left female breast: Secondary | ICD-10-CM | POA: Diagnosis not present

## 2020-12-18 DIAGNOSIS — I10 Essential (primary) hypertension: Secondary | ICD-10-CM | POA: Diagnosis not present

## 2020-12-18 DIAGNOSIS — D649 Anemia, unspecified: Secondary | ICD-10-CM | POA: Diagnosis not present

## 2020-12-18 DIAGNOSIS — G620 Drug-induced polyneuropathy: Secondary | ICD-10-CM | POA: Diagnosis not present

## 2020-12-18 DIAGNOSIS — Z95828 Presence of other vascular implants and grafts: Secondary | ICD-10-CM

## 2020-12-18 DIAGNOSIS — Z452 Encounter for adjustment and management of vascular access device: Secondary | ICD-10-CM | POA: Diagnosis not present

## 2020-12-18 DIAGNOSIS — Z5111 Encounter for antineoplastic chemotherapy: Secondary | ICD-10-CM | POA: Diagnosis not present

## 2020-12-18 DIAGNOSIS — I5042 Chronic combined systolic (congestive) and diastolic (congestive) heart failure: Secondary | ICD-10-CM | POA: Diagnosis not present

## 2020-12-18 DIAGNOSIS — E119 Type 2 diabetes mellitus without complications: Secondary | ICD-10-CM | POA: Diagnosis not present

## 2020-12-18 DIAGNOSIS — C7951 Secondary malignant neoplasm of bone: Secondary | ICD-10-CM | POA: Diagnosis not present

## 2020-12-18 DIAGNOSIS — Z7189 Other specified counseling: Secondary | ICD-10-CM

## 2020-12-18 LAB — CBC WITH DIFFERENTIAL/PLATELET
Abs Immature Granulocytes: 0.46 10*3/uL — ABNORMAL HIGH (ref 0.00–0.07)
Basophils Absolute: 0 10*3/uL (ref 0.0–0.1)
Basophils Relative: 1 %
Eosinophils Absolute: 0.2 10*3/uL (ref 0.0–0.5)
Eosinophils Relative: 3 %
HCT: 30.3 % — ABNORMAL LOW (ref 36.0–46.0)
Hemoglobin: 10 g/dL — ABNORMAL LOW (ref 12.0–15.0)
Immature Granulocytes: 7 %
Lymphocytes Relative: 11 %
Lymphs Abs: 0.7 10*3/uL (ref 0.7–4.0)
MCH: 30.1 pg (ref 26.0–34.0)
MCHC: 33 g/dL (ref 30.0–36.0)
MCV: 91.3 fL (ref 80.0–100.0)
Monocytes Absolute: 0.4 10*3/uL (ref 0.1–1.0)
Monocytes Relative: 6 %
Neutro Abs: 4.7 10*3/uL (ref 1.7–7.7)
Neutrophils Relative %: 72 %
Platelets: 298 10*3/uL (ref 150–400)
RBC: 3.32 MIL/uL — ABNORMAL LOW (ref 3.87–5.11)
RDW: 15.3 % (ref 11.5–15.5)
WBC: 6.5 10*3/uL (ref 4.0–10.5)
nRBC: 0 % (ref 0.0–0.2)

## 2020-12-18 LAB — COMPREHENSIVE METABOLIC PANEL
ALT: 35 U/L (ref 0–44)
AST: 25 U/L (ref 15–41)
Albumin: 3.8 g/dL (ref 3.5–5.0)
Alkaline Phosphatase: 104 U/L (ref 38–126)
Anion gap: 11 (ref 5–15)
BUN: 12 mg/dL (ref 8–23)
CO2: 23 mmol/L (ref 22–32)
Calcium: 9.2 mg/dL (ref 8.9–10.3)
Chloride: 108 mmol/L (ref 98–111)
Creatinine, Ser: 0.77 mg/dL (ref 0.44–1.00)
GFR, Estimated: >60 mL/min (ref >60)
Glucose, Bld: 142 mg/dL — ABNORMAL HIGH (ref 70–99)
Potassium: 3.4 mmol/L — ABNORMAL LOW (ref 3.5–5.1)
Sodium: 142 mmol/L (ref 135–145)
Total Bilirubin: 0.5 mg/dL (ref 0.3–1.2)
Total Protein: 7.2 g/dL (ref 6.5–8.1)

## 2020-12-18 MED ORDER — DEXAMETHASONE SODIUM PHOSPHATE 10 MG/ML IJ SOLN
5.0000 mg | Freq: Once | INTRAMUSCULAR | Status: AC
  Administered 2020-12-18: 5 mg via INTRAVENOUS

## 2020-12-18 MED ORDER — DIPHENHYDRAMINE HCL 50 MG/ML IJ SOLN
25.0000 mg | Freq: Once | INTRAMUSCULAR | Status: AC
  Administered 2020-12-18: 25 mg via INTRAVENOUS

## 2020-12-18 MED ORDER — FAMOTIDINE 20 MG IN NS 100 ML IVPB
20.0000 mg | Freq: Once | INTRAVENOUS | Status: AC
Start: 2020-12-18 — End: 2020-12-18
  Administered 2020-12-18: 20 mg via INTRAVENOUS

## 2020-12-18 MED ORDER — SODIUM CHLORIDE 0.9 % IV SOLN
Freq: Once | INTRAVENOUS | Status: AC
  Filled 2020-12-18: qty 250

## 2020-12-18 MED ORDER — SODIUM CHLORIDE 0.9% FLUSH
10.0000 mL | INTRAVENOUS | Status: DC | PRN
  Administered 2020-12-18: 10 mL via INTRAVENOUS
  Filled 2020-12-18: qty 10

## 2020-12-18 MED ORDER — SODIUM CHLORIDE 0.9% FLUSH
10.0000 mL | INTRAVENOUS | Status: DC | PRN
  Administered 2020-12-18: 10 mL
  Filled 2020-12-18: qty 10

## 2020-12-18 MED ORDER — SODIUM CHLORIDE 0.9 % IV SOLN
50.0000 mg/m2 | Freq: Once | INTRAVENOUS | Status: AC
  Administered 2020-12-18: 108 mg via INTRAVENOUS
  Filled 2020-12-18: qty 18

## 2020-12-18 MED ORDER — HEPARIN SOD (PORK) LOCK FLUSH 100 UNIT/ML IV SOLN
500.0000 [IU] | Freq: Once | INTRAVENOUS | Status: AC | PRN
  Administered 2020-12-18: 500 [IU]
  Filled 2020-12-18: qty 5

## 2020-12-18 NOTE — Patient Instructions (Signed)
[facility]  Discharge Instructions: °Thank you for choosing [facility] to provide your oncology and hematology care.  ° °If you have a lab appointment with the Cancer Center, please go directly to the Cancer Center and check in at the registration area. °  °Wear comfortable clothing and clothing appropriate for easy access to any Portacath or PICC line.  ° °We strive to give you quality time with your provider. You may need to reschedule your appointment if you arrive late (15 or more minutes).  Arriving late affects you and other patients whose appointments are after yours.  Also, if you miss three or more appointments without notifying the office, you may be dismissed from the clinic at the provider’s discretion.    °  °For prescription refill requests, have your pharmacy contact our office and allow 72 hours for refills to be completed.   ° °Today you received the following chemotherapy and/or immunotherapy agents: Taxol   °  °To help prevent nausea and vomiting after your treatment, we encourage you to take your nausea medication as directed. ° °BELOW ARE SYMPTOMS THAT SHOULD BE REPORTED IMMEDIATELY: °*FEVER GREATER THAN 100.4 F (38 °C) OR HIGHER °*CHILLS OR SWEATING °*NAUSEA AND VOMITING THAT IS NOT CONTROLLED WITH YOUR NAUSEA MEDICATION °*UNUSUAL SHORTNESS OF BREATH °*UNUSUAL BRUISING OR BLEEDING °*URINARY PROBLEMS (pain or burning when urinating, or frequent urination) °*BOWEL PROBLEMS (unusual diarrhea, constipation, pain near the anus) °TENDERNESS IN MOUTH AND THROAT WITH OR WITHOUT PRESENCE OF ULCERS (sore throat, sores in mouth, or a toothache) °UNUSUAL RASH, SWELLING OR PAIN  °UNUSUAL VAGINAL DISCHARGE OR ITCHING  ° °Items with * indicate a potential emergency and should be followed up as soon as possible or go to the Emergency Department if any problems should occur. ° °Please show the CHEMOTHERAPY ALERT CARD or IMMUNOTHERAPY ALERT CARD at check-in to the  Emergency Department and triage nurse. ° °Should you have questions after your visit or need to cancel or reschedule your appointment, please contact [facility]  Dept: [phone]  and follow the prompts.  Office hours are 8:00 a.m. to 4:30 p.m. Monday - Friday. Please note that voicemails left after 4:00 p.m. may not be returned until the following business day.  We are closed weekends and major holidays. You have access to a nurse at all times for urgent questions. Please call the main number to the clinic Dept: [phone] and follow the prompts. ° ° °For any non-urgent questions, you may also contact your provider using MyChart. We now offer e-Visits for anyone 18 and older to request care online for non-urgent symptoms. For details visit [email or web address]. °  °Also download the MyChart app! Go to the app store, search "MyChart", open the app, select [facility], and log in with your MyChart username and password. ° °Due to Covid, a mask is required upon entering the hospital/clinic. If you do not have a mask, one will be given to you upon arrival. For doctor visits, patients may have 1 support person aged 18 or older with them. For treatment visits, patients cannot have anyone with them due to current Covid guidelines and our immunocompromised population.  ° °

## 2020-12-25 ENCOUNTER — Other Ambulatory Visit (HOSPITAL_COMMUNITY): Payer: Self-pay

## 2020-12-25 MED ORDER — CAPECITABINE 500 MG PO TABS
ORAL_TABLET | ORAL | 0 refills | Status: DC
  Filled 2020-12-25: qty 98, 21d supply, fill #0

## 2020-12-25 NOTE — Telephone Encounter (Signed)
Oral Chemotherapy Pharmacist Encounter  I spoke with patient for overview of: Xeloda (capecitabine) for the  treatment of metastatic breast cancer, ER positive, PR negative, HER-2 negative, planned duration until disease progression or unacceptable drug toxicity.  Counseled patient on administration, dosing, side effects, monitoring, drug-food interactions, safe handling, storage, and disposal.  Patient will take Xeloda 520m tablets, 3 tablets (15054m by mouth in AM and 4 tabs (200057mby mouth in PM, within 30 minutes of finishing meals, for 14 days on, 7 days off, repeated every 21 days.  Xeloda start date: after MD office visit 01/01/21 - patient knows not to start until directed by Dr. FenBurr MedicoAdverse effects include but are not limited to: fatigue, decreased blood counts, GI upset, diarrhea, mouth sores, and hand-foot syndrome.  Patient will obtain anti diarrheal and alert the office of 4 or more loose stools above baseline.  Reviewed with patient importance of keeping a medication schedule and plan for any missed doses. No barriers to medication adherence identified.  Medication reconciliation performed and medication/allergy list updated.  Patient will pick this up from the [facility] 12/26/20. Patient knows not to start until directed by Dr. FenBurr Medico Patient informed the pharmacy will reach out 5-7 days prior to needing next fill of Xeloda to coordinate continued medication acquisition to prevent break in therapy.  All questions answered.  Ms. BowBargaiced understanding and appreciation.   Medication [facility]26/20 per her request. Patient knows to call the office with questions or concerns. Oral Chemotherapy Clinic phone number provided to patient.   RebLeron CroakharmD, BCPS Hematology/Oncology Clinical Pharmacist [id number] 2:23 PM

## 2020-12-26 ENCOUNTER — Encounter (HOSPITAL_COMMUNITY)
Admission: RE | Admit: 2020-12-26 | Discharge: 2020-12-26 | Disposition: A | Discharge status: Home / Self Care | Payer: Medicare Other | Source: Ambulatory Visit | Attending: Hematology | Admitting: Hematology

## 2020-12-26 ENCOUNTER — Other Ambulatory Visit (HOSPITAL_COMMUNITY): Payer: Self-pay

## 2020-12-26 ENCOUNTER — Other Ambulatory Visit: Payer: Self-pay

## 2020-12-26 ENCOUNTER — Ambulatory Visit (HOSPITAL_COMMUNITY): Payer: Medicare Other

## 2020-12-26 DIAGNOSIS — C7981 Secondary malignant neoplasm of breast: Secondary | ICD-10-CM | POA: Diagnosis not present

## 2020-12-26 DIAGNOSIS — C50512 Malignant neoplasm of lower-outer quadrant of left female breast: Secondary | ICD-10-CM | POA: Insufficient documentation

## 2020-12-26 DIAGNOSIS — Z171 Estrogen receptor negative status [ER-]: Secondary | ICD-10-CM | POA: Insufficient documentation

## 2020-12-26 DIAGNOSIS — C50919 Malignant neoplasm of unspecified site of unspecified female breast: Secondary | ICD-10-CM | POA: Diagnosis not present

## 2020-12-26 MED ORDER — TECHNETIUM TC 99M MEDRONATE IV KIT
20.0000 | PACK | Freq: Once | INTRAVENOUS | Status: AC | PRN
  Administered 2020-12-26: 20 via INTRAVENOUS

## 2020-12-27 NOTE — Progress Notes (Signed)
[place]   [phone] [phone]   Clinic Follow up Note   Patient Care Team: Girtha Rm, NP-C as PCP - General (Family Medicine) [name], MD as PCP - Cardiology (Cardiology) [name], MD as Consulting Physician (Hematology) [name], MD as Consulting Physician (General Surgery)  Date of Service:  01/01/2021  CHIEF COMPLAINT: F/u ofmetastaticleft breastcancer  SUMMARY OF ONCOLOGIC HISTORY: Oncology History Overview Note  Cancer Staging Malignant neoplasm of lower-outer quadrant of left breast of female, estrogen receptor negative ([place]) Staging form: Breast, AJCC 8th Edition - Clinical stage from 06/06/2017: Stage IB (cT1c, cN0, cM0, G3, ER: Negative, PR: Negative, HER2: Negative) - Signed by [name], MD on 06/15/2017 - Pathologic stage from 06/26/2017: Stage IB (pT1c, pN0, cM0, G3, ER: Negative, PR: Negative, HER2: Negative) - Signed by [name], MD on 07/10/2017     Malignant neoplasm of lower-outer quadrant of left breast of female, estrogen receptor negative ([place])  05/30/2017 Mammogram   Diagnostic mammo and US IMPRESSION: 1. Highly suspicious 1.4 cm mass in the slightly lower slightly outer left breast -tissue sampling recommended. 2. Indeterminate 0.5 mm mass in the slightly lower slightly outer left breast -tissue sampling recommended. 3. At least 2 left axillary lymph nodes with borderline cortical thickness.   06/06/2017 Initial Biopsy   Diagnosis 1. Breast, left, needle core biopsy, 5:30 o'clock - INVASIVE DUCTAL CARCINOMA, G3 2. Lymph node, needle/core biopsy, left axillary - NO CARCINOMA IDENTIFIED IN ONE LYMPH NODE (0/1)   06/06/2017 Initial Diagnosis   Malignant neoplasm of lower-outer quadrant of left breast of female, estrogen receptor negative ([place])   06/06/2017 Receptors her2   Estrogen Receptor: 0%, NEGATIVE Progesterone Receptor: 0%, NEGATIVE Proliferation Marker Ki67: 70%    06/26/2017 Surgery   LEFT BREAST LUMPECTOMY WITH RADIOACTIVE SEED AND SENTINEL LYMPH NODE BIOPSY ERAS  PATHWAY AND INSERTION PORT-A-CATH By Dr. Barry Dienes on 06/26/17    06/26/2017 Pathology Results   Diagnosis 06/26/17  1. Breast, lumpectomy, Left - INVASIVE DUCTAL CARCINOMA, GRADE III/III, SPANNING 1.2 CM. - THE SURGICAL RESECTION MARGINS ARE NEGATIVE FOR CARCINOMA. - SEE ONCOLOGY TABLE BELOW. 2. Lymph node, sentinel, biopsy, Left axillary #1 - THERE IS NO EVIDENCE OF CARCINOMA IN 1 OF 1 LYMPH NODE (0/1). 3. Lymph node, sentinel, biopsy, Left axillary #2 - THERE IS NO EVIDENCE OF CARCINOMA IN 1 OF 1 LYMPH NODE (0/1). 4. Lymph node, sentinel, biopsy, Left axillary #3 - THERE IS NO EVIDENCE OF CARCINOMA IN 1 OF 1 LYMPH NODE (0/1).    07/25/2017 - 09/29/2017 Chemotherapy   Adjuvant cytoxan and docetaxel (TC) every 3 weekd for 4 cycles     11/19/2017 - 12/17/2017 Radiation Therapy   Adjuvant breast radiation Left breast treated to 42.5 Gy with 17 fx of 2.5 Gy followed by a boost of 7.5 Gy with 3 fx of 2.5 Gy     02/2019 Procedure   She had PAC removal in 02/2019.    07/23/2020 Imaging   MRI Lumbar Spine 07/23/20 IMPRESSION: 1. No significant disc herniation, spinal canal or neural foraminal stenosis at any level. 2. Moderate facet degenerative changes at L3-4, L4-5 and L5-S1. 3. Multiple enlarged retroperitoneal and right iliac lymph nodes. Recommend correlation with CT of the abdomen and pelvis with contrast.   08/15/2020 Imaging   CT AP 08/15/20  IMPRESSION: 1. Right iliac and periaortic adenopathy is noted concerning for metastatic disease or lymphoma. Also noted is abnormal soft tissue mass anterior and posterior to the right iliac  wing concerning for malignancy or metastatic disease. MRI is recommended for further evaluation. 2. Irregular lucency with sclerotic margins is seen involving the right iliac crest concerning for possible lytic lesion. MRI may be performed for  further evaluation. 3. Enlarged fibroid uterus. 4. Small gallstone. 5. Aortic atherosclerosis.   08/22/2020 Pathology Results   FINAL MICROSCOPIC DIAGNOSIS: 08/22/20  A. NEEDLE CORE, RIGHT GLUTEUS MINIMUMUS, BIOPSY:  -  Metastatic carcinoma  -  See comment   COMMENT:   By immunohistochemistry, the neoplastic cells are positive for  cytokeratin-7 and have weak patchy positivity for GATA3 but are negative  for cytokeratin-20 and GCDFP.  The combined morphology and  immunophenotype, support metastasis from the patient's known breast  primary carcinoma.  Dr. Burr Medico was notified of these results on 08/24/2020.  Prognostic markers (ER, PR, Her2, PDL-1) are pending and will be  reported in an addendum   ADDENDUM:   PROGNOSTIC INDICATOR RESULTS:   Immunohistochemical and morphometric analysis performed manually   The tumor cells are NEGATIVE for Her2 (0).   Estrogen Receptor:       POSITIVE, 20%, WEAK STAINING  Progesterone Receptor:   NEGATIVE    PD-L1 (0) negative    09/04/2020 - 09/22/2020 Radiation Therapy   Palliative radiation to right iliac 09/04/20 - 09/22/20, Dr. Lisbeth Renshaw   09/26/2020 -  Chemotherapy   first line weekly Taxol 3 weeks on/1 week off starting 09/26/20 --Reduced to 2 weeks on/1 week off starting with C3 due to neuropathy. Stopped after C4 on 12/18/20 due to neuratrophy.  --Switched to Xeloda on 01/01/21 at 1536m in the AM and 20070min the PM for 2 weeks on/1 week off     10/13/2020 Procedure    PAC placement on 10/13/20.       CURRENT THERAPY:  -first line weekly Taxol3 weeks on/1 week offstarting 09/26/20, will givefilgrastimas needed.Reduced to 2 weeks on/1 week off starting with C3 due to neuropathy. Stopped after C4 on 12/18/20 due to neuratrophy. Switched to Xeloda on 01/01/21 at 15009mn the AM and 2000m33m the PM for 2 weeks on/1 week off  -Zometa q3mon49monthrting 11/20/20   INTERVAL HISTORY:  Kristin Ward is here for a follow up. She was last seen  by me 12/11/20. She presents to the clinic alone. She notes tightness and discomfort which she attributes from initial lateral right hip pain, to now on her right buttocks tightness. She is able to ambulate well and walking for miles. She notes her neuropathy is a light tingling with flares in her feet. I reviewed her medication list with her. She is on Lasix. She notes her BG was in 88-90 range in the last few days. She will occasionally have hyperglycemia.     REVIEW OF SYSTEMS:   Constitutional: Denies fevers, chills or abnormal weight loss Eyes: Denies blurriness of vision Ears, nose, mouth, throat, and face: Denies mucositis or sore throat Respiratory: Denies cough, dyspnea or wheezes Cardiovascular: Denies palpitation, chest discomfort or lower extremity swelling Gastrointestinal:  Denies nausea, heartburn or change in bowel habits  MSK: (+) Right buttock tightness  Skin: Denies abnormal skin rashes Lymphatics: Denies new lymphadenopathy or easy bruising Neurological: (+) Neuropathy with light tingling in her feet>hands Behavioral/Psych: Mood is stable, no new changes  All other systems were reviewed with the patient and are negative.  MEDICAL HISTORY:  Past Medical History:  Diagnosis Date  . Abnormal x-ray of lungs with single pulmonary nodule 03/15/2016   per records from [place]  Anemia   . Asthma   . Cholelithiasis 05/19/2017   On CT  . Coronary artery calcification seen on CAT scan 05/19/2017  . Diabetes mellitus without complication ([place])    type 2  . Dilated idiopathic cardiomyopathy ([place]) 12/2015   EF 15-20%. Diagnosed in [facility]  . Headache    NONE RECENT  . Hypertension   . Lymph nodes enlarged 07/25/2020  . Malignant neoplasm of lower-outer quadrant of left breast of female, estrogen receptor negative ([place]) 06/11/2017   left breast  . Mixed hyperlipidemia 06/17/2018  . partially blind   . Personal history of chemotherapy 2018-2019  .  Personal history of radiation therapy 2019  . Retinitis pigmentosa   . Retroperitoneal lymphadenopathy 07/25/2020  . Uveitis     SURGICAL HISTORY: Past Surgical History:  Procedure Laterality Date  . brain cyst removed  2007   to help with headaches per patient , aspirated   . BREAST BIOPSY Left 06/06/2017   x2  . BREAST CYST ASPIRATION Left 06/06/2017  . BREAST LUMPECTOMY Left 06/26/2017  . BREAST LUMPECTOMY WITH RADIOACTIVE SEED AND SENTINEL LYMPH NODE BIOPSY Left 06/26/2017   Procedure: BREAST LUMPECTOMY WITH RADIOACTIVE SEED AND SENTINEL LYMPH NODE BIOPSY ERAS  PATHWAY;  Surgeon: [name], MD;  Location: [facility];  Service: General;  Laterality: Left;  pec block  . FINE NEEDLE ASPIRATION Left 02/23/2019   Procedure: FINE NEEDLE ASPIRATION;  Surgeon: [name], MD;  Location: [facility];  Service: General;  Laterality: Left;  Asiration of left breast seroma   . IR IMAGING GUIDED PORT INSERTION  10/13/2020  . NASAL TURBINATE REDUCTION    . PORT-A-CATH REMOVAL Right 02/23/2019   Procedure: REMOVAL PORT-A-CATH;  Surgeon: [name], MD;  Location: [facility];  Service: General;  Laterality: Right;  . PORTACATH PLACEMENT N/A 06/26/2017   Procedure: INSERTION PORT-A-CATH;  Surgeon: [name], MD;  Location: [facility];  Service: General;  Laterality: N/A;  . TRANSTHORACIC ECHOCARDIOGRAM  12/2015   A) [facility] May 2017: Mild concentric LVH. Global hypokinesis. GR daily. EF 18% severe LA dilation. Mitral annular dilatation with papillary muscle dysfunction and moderate MR. Dilated IVC consistent with elevated RAP.  [facility] TRANSTHORACIC ECHOCARDIOGRAM  05/2017; 08/2017   a) Mild concentric LVH.  EF 45 to 50% with diffuse hypokinesis.  GR 1 DD.  Mild aortic root dilation.;; b)  Mild LVH EF 45 to 50%.  Diffuse HK.  No significant valve disease.  No significant change.    I have reviewed the social history and family history with the patient and they  are unchanged from previous note.  ALLERGIES:  is allergic to morphine and related, latex, tylenol with codeine #3 [acetaminophen-codeine], and penicillins.  MEDICATIONS:  Current Outpatient Medications  Medication Sig Dispense Refill  . furosemide (LASIX) 20 MG tablet Take 20 mg by mouth daily. Patient is unsure of dose    . aspirin 81 MG chewable tablet Chew 81 mg by mouth daily.    . capecitabine (XELODA) 500 MG tablet Take 3 tabs in morning and 4 tabs in evening, every 12hrs, for 14 days then off for 7 days. Repeat every 21 days. Take within 30 minutes after meals. 98 tablet 0  . carvedilol (COREG) 12.5 MG tablet Take 1 tablet (12.5 mg total) by mouth 2 (two) times daily with a meal. 180 tablet 1  . Cholecalciferol (D3-1000 PO) Take by mouth.    . diclofenac (VOLTAREN) 75 MG EC tablet Take 1 tablet (  75 mg total) by mouth 2 (two) times daily. 60 tablet 1  . glucose blood (ACCU-CHEK GUIDE) test strip Use as instructed to test blood sugar 2 times a day 100 strip 2  . ibuprofen (ADVIL) 800 MG tablet Take 1 tablet (800 mg total) by mouth every 8 (eight) hours as needed. 30 tablet 2  . lidocaine-prilocaine (EMLA) cream Apply 1 application topically as needed. 30 g 0  . linaclotide (LINZESS) 72 MCG capsule Take 1 capsule (72 mcg total) by mouth daily before breakfast. 30 capsule 1  . Multiple Vitamins-Minerals (WOMENS MULTI PO) Take by mouth.    . ondansetron (ZOFRAN) 8 MG tablet Take 1 tablet (8 mg total) by mouth 2 (two) times daily as needed (Nausea or vomiting). 30 tablet 1  . prochlorperazine (COMPAZINE) 10 MG tablet Take 1 tablet (10 mg total) by mouth every 6 (six) hours as needed (Nausea or vomiting). 30 tablet 1  . Saccharomyces boulardii (PROBIOTIC) 250 MG CAPS Take by mouth.    . sacubitril-valsartan (ENTRESTO) 97-103 MG Take 1 tablet by mouth 2 (two) times daily. 180 tablet 3  . spironolactone (ALDACTONE) 25 MG tablet Take 1 tablet (25 mg total) by mouth daily. 90 tablet 3  . vitamin  B-12 (CYANOCOBALAMIN) 500 MCG tablet Take 500 mcg by mouth daily. (Patient not taking: No sig reported)    . vitamin C (ASCORBIC ACID) 500 MG tablet Take 1,000 mg 2 (two) times daily by mouth.     No current facility-administered medications for this visit.    PHYSICAL EXAMINATION: ECOG PERFORMANCE STATUS: 1 - Symptomatic but completely ambulatory  Vitals:   01/01/21 1419  BP: (!) 150/78  Pulse: 99  Resp: 18  Temp: 97.9 F (36.6 C)  SpO2: 95%   Filed Weights   01/01/21 1419  Weight: 235 lb 11.2 oz (106.9 kg)    Due to COVID19 we will limit examination to appearance. Patient had no complaints.  GENERAL:alert, no distress and comfortable SKIN: skin color normal, no rashes or significant lesions EYES: normal, Conjunctiva are pink and non-injected, sclera clear  NEURO: alert & oriented x 3 with fluent speech   LABORATORY DATA:  I have reviewed the data as listed CBC Latest Ref Rng & Units 01/01/2021 12/18/2020 12/11/2020  WBC 4.0 - 10.5 K/uL 4.9 6.5 4.3  Hemoglobin 12.0 - 15.0 g/dL 10.4(L) 10.0(L) 10.3(L)  Hematocrit 36.0 - 46.0 % 31.6(L) 30.3(L) 31.4(L)  Platelets 150 - 400 K/uL 328 298 376     CMP Latest Ref Rng & Units 01/01/2021 12/18/2020 12/11/2020  Glucose 70 - 99 mg/dL 222(H) 142(H) 245(H)  BUN 8 - 23 mg/dL '12 12 11  ' Creatinine 0.44 - 1.00 mg/dL 0.82 0.77 0.89  Sodium 135 - 145 mmol/L 139 142 140  Potassium 3.5 - 5.1 mmol/L 4.1 3.4(L) 4.0  Chloride 98 - 111 mmol/L 106 108 106  CO2 22 - 32 mmol/L '26 23 26  ' Calcium 8.9 - 10.3 mg/dL 9.6 9.2 9.3  Total Protein 6.5 - 8.1 g/dL 7.3 7.2 7.2  Total Bilirubin 0.3 - 1.2 mg/dL 0.6 0.5 0.4  Alkaline Phos 38 - 126 U/L 101 104 96  AST 15 - 41 U/L '25 25 22  ' ALT 0 - 44 U/L 20 35 16      RADIOGRAPHIC STUDIES: I have personally reviewed the radiological images as listed and agreed with the findings in the report. No results found.   ASSESSMENT & PLAN:  Jermeka Schlotterbeck is a 65 y.o. female with  1. Left breast invasive  ductal carcinoma, stage IB, p(T1cN0M0), Triple negative, Grade 3, in 2018, metastatic disease to right gluteal mass/iliac and diffuse node metastasis in 08/2020 ER 20% weakly + PR/HER2 (0) negative, PD-L1 0% -She was initially diagnosed in 05/2017.She has high risk for recurrence due to triple negative disease. She is s/p left breast lumpectomy, adjuvant chemo TC and Radiation. -Genetictesting was negative for pathogenetic mutations. -Due to recent right gluteal pain, she underwent biopsy on 09/02/20 which showed metastatic carcinoma, consistent with breast primary. ER 20% positive, ER and HER2 negative.  -I discussed with stage IV metastatic breast cancer, her cancer is no longer curable but still treatable. Goal of therapy is palliative to control her disease and prolong her life. -I started her onfirst-line chemotherapy with weekly Taxolon 2/15/22for 3 weeks on/ 1 week off.I discussed if she does well on chemo for4-6 months, we can change treatment to antiestrogen therapy without chemo, given her ER weekly positivediease. Due to worsened neuropathy, Taxol stopped after C4 on 12/18/20. Plan to switch her to Xeloda 1542m in the AM and 20051min the PM 2 weeks on/1 week off. I reviewed side effects with her today.  -Her 12/26/20 Bone scan showed known metastatic lesion involving the right iliacus muscle and right iliac crest seen on prior PET scan with mild metabolic activity, likely good response.  Her CT scan was not done due to the national shortage of IV contrast.  Will order CT CAP wo contrast given contrast shortage. I personally reviewed with patient. She is agreeable.  -Labs reviewed and stable. Overall adequate to start Xeloda today (01/01/21).  -Phone call next week. OV in 3 weeks.  2. G2peripheralNeuropathy  -S/p C2D8 Taxol she developed mild tingling in her hands and feet.  -She uses ice bags at home as needed.  -She continues to have moderate numbness in her hands and light  tingling in her feet. She can take B12 or acupuncture. I encouraged her to remain active.  -Taxol stopped after 12/18/20. Plan to switch to Xeloda from 01/01/21.   3.Right hip pain, Right iliac bone met, Vit D deficiency -She notes she had recurrent right hip pain since 2020thatradiateddown her right hip and buttock pain. -Her right hip pain is caused bythe large infiltrative mass around the right iliac wing, Secondary to metastatic disease.  -ShecompletedPalliative radiation to right iliac 09/04/20 - 09/22/20 with Dr. MoLisbeth Renshawand her pain is much improved. -Given recent decreased Vit D level, she can continue Vit D 2000-5000 units. I also recommend she take calcium  -Given localized bone met, I started her on Zometa q3m65monthn 11/20/20  to strengthen her bones and treatment for her bone mets.. -She does not have much pain now, mostly muscle related right buttock tightness. She still has oxycodone, although not used much. I encouraged her to continue to walk often.   4. HTN,DM,COPD, retinitis pigmentosa with limited vision,Chronic combined systolic and diastolic heart failure, EF 45-50% -Continue to follow-up withPCP Dr HenForest BeckerrdiologistDr HarEllyn Hackhe is legally blind -She notes previously being seen by endocrinologist who had her on DM medication and insulin. Patient stopped medication and seeing her after BG decreased. I will check her A1c with next labs.  5. Anemia -She developed anemia during adjuvant chemotherapy, which resolved after she completed chemo -She had recurrent mild anemia with Hgb 11.7 in 01/2019.Labs on 04/02/2019 showed normal iron, ferritin, folate, and MMA -Remains mild and intermittent.  6.Goal of care discussion  -The patient understands the goal of  care is palliative. -she is full code now  7. Financial and social support  -She notes she was getting bills from [place]. I discussed getting more help with our financial advocate.  -She has  had episode of deep depression since her metastatic cancer diagnosis. I encouraged her to f/u with SW for counseling. She is interested.   Plan -Lab reviewed,and adequate to start Xeloda C1D1 today at 1536m in the AM and 200101min the PM for 2 weeks on/1 week off. -CT CAP wo contrast in 2-3 weeks.  -Phone call next week  -Lab, flush, F/u in 3 weeks.   No problem-specific Assessment & Plan notes found for this encounter.   Orders Placed This Encounter  Procedures  . CT CHEST ABDOMEN PELVIS WO CONTRAST    Standing Status:   Future    Standing Expiration Date:   01/01/2022    Order Specific Question:   If indicated for the ordered procedure, I authorize the administration of contrast media per Radiology protocol    Answer:   Yes    Order Specific Question:   Preferred [facility]?    [facility]  Order Specific Question:   Is Oral Contrast requested for this exam?    Answer:   Yes, Per Radiology protocol    Order Specific Question:   Reason for Exam (SYMPTOM  OR DIAGNOSIS REQUIRED)    Answer:   metastatic breast cancer, assess treatment response   All questions were answered. The patient knows to call the clinic with any problems, questions or concerns. No barriers to learning was detected. The total time spent in the appointment was 30 minutes.     YaTruitt MerleMD 01/01/2021   I, AmJoslyn Devonam acting as scribe for YaTruitt MerleMD.   I have reviewed the above documentation for accuracy and completeness, and I agree with the above.

## 2021-01-01 ENCOUNTER — Encounter: Payer: Self-pay | Admitting: Hematology

## 2021-01-01 ENCOUNTER — Inpatient Hospital Stay (HOSPITAL_BASED_OUTPATIENT_CLINIC_OR_DEPARTMENT_OTHER): Payer: Medicare Other | Admitting: Hematology

## 2021-01-01 ENCOUNTER — Inpatient Hospital Stay: Payer: Medicare Other

## 2021-01-01 ENCOUNTER — Other Ambulatory Visit: Payer: Self-pay

## 2021-01-01 VITALS — BP 150/78 | HR 99 | Temp 97.9°F | Resp 18 | Ht 67.0 in | Wt 235.7 lb

## 2021-01-01 DIAGNOSIS — C50512 Malignant neoplasm of lower-outer quadrant of left female breast: Secondary | ICD-10-CM

## 2021-01-01 DIAGNOSIS — C799 Secondary malignant neoplasm of unspecified site: Secondary | ICD-10-CM | POA: Diagnosis not present

## 2021-01-01 DIAGNOSIS — C50919 Malignant neoplasm of unspecified site of unspecified female breast: Secondary | ICD-10-CM | POA: Diagnosis not present

## 2021-01-01 DIAGNOSIS — C7951 Secondary malignant neoplasm of bone: Secondary | ICD-10-CM | POA: Diagnosis not present

## 2021-01-01 DIAGNOSIS — Z171 Estrogen receptor negative status [ER-]: Secondary | ICD-10-CM | POA: Diagnosis not present

## 2021-01-01 DIAGNOSIS — I5042 Chronic combined systolic (congestive) and diastolic (congestive) heart failure: Secondary | ICD-10-CM

## 2021-01-01 DIAGNOSIS — G620 Drug-induced polyneuropathy: Secondary | ICD-10-CM | POA: Diagnosis not present

## 2021-01-01 DIAGNOSIS — I10 Essential (primary) hypertension: Secondary | ICD-10-CM | POA: Diagnosis not present

## 2021-01-01 DIAGNOSIS — E119 Type 2 diabetes mellitus without complications: Secondary | ICD-10-CM | POA: Diagnosis not present

## 2021-01-01 DIAGNOSIS — D649 Anemia, unspecified: Secondary | ICD-10-CM | POA: Diagnosis not present

## 2021-01-01 DIAGNOSIS — Z5111 Encounter for antineoplastic chemotherapy: Secondary | ICD-10-CM | POA: Diagnosis not present

## 2021-01-01 DIAGNOSIS — Z452 Encounter for adjustment and management of vascular access device: Secondary | ICD-10-CM | POA: Diagnosis not present

## 2021-01-01 DIAGNOSIS — Z95828 Presence of other vascular implants and grafts: Secondary | ICD-10-CM

## 2021-01-01 LAB — COMPREHENSIVE METABOLIC PANEL
ALT: 20 U/L (ref 0–44)
AST: 25 U/L (ref 15–41)
Albumin: 3.6 g/dL (ref 3.5–5.0)
Alkaline Phosphatase: 101 U/L (ref 38–126)
Anion gap: 7 (ref 5–15)
BUN: 12 mg/dL (ref 8–23)
CO2: 26 mmol/L (ref 22–32)
Calcium: 9.6 mg/dL (ref 8.9–10.3)
Chloride: 106 mmol/L (ref 98–111)
Creatinine, Ser: 0.82 mg/dL (ref 0.44–1.00)
GFR, Estimated: >60 mL/min (ref >60)
Glucose, Bld: 222 mg/dL — ABNORMAL HIGH (ref 70–99)
Potassium: 4.1 mmol/L (ref 3.5–5.1)
Sodium: 139 mmol/L (ref 135–145)
Total Bilirubin: 0.6 mg/dL (ref 0.3–1.2)
Total Protein: 7.3 g/dL (ref 6.5–8.1)

## 2021-01-01 LAB — CBC WITH DIFFERENTIAL/PLATELET
Abs Immature Granulocytes: 0.04 10*3/uL (ref 0.00–0.07)
Basophils Absolute: 0 10*3/uL (ref 0.0–0.1)
Basophils Relative: 0 %
Eosinophils Absolute: 0.1 10*3/uL (ref 0.0–0.5)
Eosinophils Relative: 3 %
HCT: 31.6 % — ABNORMAL LOW (ref 36.0–46.0)
Hemoglobin: 10.4 g/dL — ABNORMAL LOW (ref 12.0–15.0)
Immature Granulocytes: 1 %
Lymphocytes Relative: 14 %
Lymphs Abs: 0.7 10*3/uL (ref 0.7–4.0)
MCH: 29.5 pg (ref 26.0–34.0)
MCHC: 32.9 g/dL (ref 30.0–36.0)
MCV: 89.5 fL (ref 80.0–100.0)
Monocytes Absolute: 0.5 10*3/uL (ref 0.1–1.0)
Monocytes Relative: 11 %
Neutro Abs: 3.4 10*3/uL (ref 1.7–7.7)
Neutrophils Relative %: 71 %
Platelets: 328 10*3/uL (ref 150–400)
RBC: 3.53 MIL/uL — ABNORMAL LOW (ref 3.87–5.11)
RDW: 14.6 % (ref 11.5–15.5)
WBC: 4.9 10*3/uL (ref 4.0–10.5)
nRBC: 0 % (ref 0.0–0.2)

## 2021-01-01 MED ORDER — SODIUM CHLORIDE 0.9% FLUSH
10.0000 mL | Freq: Once | INTRAVENOUS | Status: AC | PRN
  Administered 2021-01-01: 10 mL
  Filled 2021-01-01: qty 10

## 2021-01-01 MED ORDER — HEPARIN SOD (PORK) LOCK FLUSH 100 UNIT/ML IV SOLN
500.0000 [IU] | Freq: Once | INTRAVENOUS | Status: AC | PRN
  Administered 2021-01-01: 500 [IU]
  Filled 2021-01-01: qty 5

## 2021-01-03 NOTE — Progress Notes (Signed)
Kristin Ward   [phone] Fax:(336) [phone]   Clinic Follow up Note   Patient Care Team: Girtha Rm, NP-C as PCP - General (Family Medicine) [name], MD as PCP - Cardiology (Cardiology) [name], MD as Consulting Physician (Hematology) [name], MD as Consulting Physician (General Surgery)   I connected with Kristin Ward on 01/09/2021 at  9:20 AM EDT by telephone visit and verified that I am speaking with the correct person using two identifiers.  I discussed the limitations, risks, security and privacy concerns of performing an evaluation and management service by telephone and the availability of in person appointments. I also discussed with the patient that there may be a patient responsible charge related to this service. The patient expressed understanding and agreed to proceed.   Other persons participating in the visit and their role in the encounter:  None   Patient's location:  Home  Provider's location:  Office   CHIEF COMPLAINT: F/u ofmetastaticleft breastcancer  SUMMARY OF ONCOLOGIC HISTORY: Oncology History Overview Note  Cancer Staging Malignant neoplasm of lower-outer quadrant of left breast of female, estrogen receptor negative ([place]) Staging form: Breast, AJCC 8th Edition - Clinical stage from 06/06/2017: Stage IB (cT1c, cN0, cM0, G3, ER: Negative, PR: Negative, HER2: Negative) - Signed by [name], MD on 06/15/2017 - Pathologic stage from 06/26/2017: Stage IB (pT1c, pN0, cM0, G3, ER: Negative, PR: Negative, HER2: Negative) - Signed by [name], MD on 07/10/2017     Malignant neoplasm of lower-outer quadrant of left breast of female, estrogen receptor negative ([place])  05/30/2017 Mammogram   Diagnostic mammo and US IMPRESSION: 1. Highly suspicious 1.4 cm mass in the slightly lower slightly outer left breast -tissue sampling recommended. 2. Indeterminate 0.5 mm mass in the slightly lower slightly  outer left breast -tissue sampling recommended. 3. At least 2 left axillary lymph nodes with borderline cortical thickness.   06/06/2017 Initial Biopsy   Diagnosis 1. Breast, left, needle core biopsy, 5:30 o'clock - INVASIVE DUCTAL CARCINOMA, G3 2. Lymph node, needle/core biopsy, left axillary - NO CARCINOMA IDENTIFIED IN ONE LYMPH NODE (0/1)   06/06/2017 Initial Diagnosis   Malignant neoplasm of lower-outer quadrant of left breast of female, estrogen receptor negative ([place])   06/06/2017 Receptors her2   Estrogen Receptor: 0%, NEGATIVE Progesterone Receptor: 0%, NEGATIVE Proliferation Marker Ki67: 70%   06/26/2017 Surgery   LEFT BREAST LUMPECTOMY WITH RADIOACTIVE SEED AND SENTINEL LYMPH NODE BIOPSY ERAS  PATHWAY AND INSERTION PORT-A-CATH By Dr. Barry Dienes on 06/26/17    06/26/2017 Pathology Results   Diagnosis 06/26/17  1. Breast, lumpectomy, Left - INVASIVE DUCTAL CARCINOMA, GRADE III/III, SPANNING 1.2 CM. - THE SURGICAL RESECTION MARGINS ARE NEGATIVE FOR CARCINOMA. - SEE ONCOLOGY TABLE BELOW. 2. Lymph node, sentinel, biopsy, Left axillary #1 - THERE IS NO EVIDENCE OF CARCINOMA IN 1 OF 1 LYMPH NODE (0/1). 3. Lymph node, sentinel, biopsy, Left axillary #2 - THERE IS NO EVIDENCE OF CARCINOMA IN 1 OF 1 LYMPH NODE (0/1). 4. Lymph node, sentinel, biopsy, Left axillary #3 - THERE IS NO EVIDENCE OF CARCINOMA IN 1 OF 1 LYMPH NODE (0/1).    07/25/2017 - 09/29/2017 Chemotherapy   Adjuvant cytoxan and docetaxel (TC) every 3 weekd for 4 cycles     11/19/2017 - 12/17/2017 Radiation Therapy   Adjuvant breast radiation Left breast treated to 42.5 Gy with 17 fx of 2.5 Gy followed by a boost of 7.5 Gy with 3 fx of 2.5 Gy  02/2019 Procedure   She had PAC removal in 02/2019.    07/23/2020 Imaging   MRI Lumbar Spine 07/23/20 IMPRESSION: 1. No significant disc herniation, spinal canal or neural foraminal stenosis at any level. 2. Moderate facet degenerative changes at L3-4, L4-5 and  L5-S1. 3. Multiple enlarged retroperitoneal and right iliac lymph nodes. Recommend correlation with CT of the abdomen and pelvis with contrast.   08/15/2020 Imaging   CT AP 08/15/20  IMPRESSION: 1. Right iliac and periaortic adenopathy is noted concerning for metastatic disease or lymphoma. Also noted is abnormal soft tissue mass anterior and posterior to the right iliac wing concerning for malignancy or metastatic disease. MRI is recommended for further evaluation. 2. Irregular lucency with sclerotic margins is seen involving the right iliac crest concerning for possible lytic lesion. MRI may be performed for further evaluation. 3. Enlarged fibroid uterus. 4. Small gallstone. 5. Aortic atherosclerosis.   08/22/2020 Pathology Results   FINAL MICROSCOPIC DIAGNOSIS: 08/22/20  A. NEEDLE CORE, RIGHT GLUTEUS MINIMUMUS, BIOPSY:  -  Metastatic carcinoma  -  See comment   COMMENT:   By immunohistochemistry, the neoplastic cells are positive for  cytokeratin-7 and have weak patchy positivity for GATA3 but are negative  for cytokeratin-20 and GCDFP.  The combined morphology and  immunophenotype, support metastasis from the patient's known breast  primary carcinoma.  Dr. Burr Medico was notified of these results on 08/24/2020.  Prognostic markers (ER, PR, Her2, PDL-1) are pending and will be  reported in an addendum   ADDENDUM:   PROGNOSTIC INDICATOR RESULTS:   Immunohistochemical and morphometric analysis performed manually   The tumor cells are NEGATIVE for Her2 (0).   Estrogen Receptor:       POSITIVE, 20%, WEAK STAINING  Progesterone Receptor:   NEGATIVE    PD-L1 (0) negative    09/04/2020 - 09/22/2020 Radiation Therapy   Palliative radiation to right iliac 09/04/20 - 09/22/20, Dr. Lisbeth Renshaw   09/26/2020 -  Chemotherapy   first line weekly Taxol 3 weeks on/1 week off starting 09/26/20 --Reduced to 2 weeks on/1 week off starting with C3 due to neuropathy. Stopped after C4 on 12/18/20 due to  neuratrophy.  --Switched to Xeloda on 01/01/21 at 1579m in the AM and 20033min the PM for 2 weeks on/1 week off     10/13/2020 Procedure    PAC placement on 10/13/20.       CURRENT THERAPY:  -first line weekly Taxol3 weeks on/1 week offstarting 09/26/20, will givefilgrastimas needed.Reduced to 2 weeks on/1 week off starting with C3 due to neuropathy.Stopped after C4 on 12/18/20 due to neuratrophy. Switched to Xeloda on 01/01/21 at 1500106mn the AM and 2000m20m the PM for 2 weeks on/1 week off  -Zometa q3mon90monthting 11/20/20  INTERVAL HISTORY:  PatriLesly Pontarellins is scheduled for a virtual follow up.  I called her and verified her identity.  She has noticed right hip pain in the past week, similar to her pain she had when she had metastatic cancer diagnosis. Pain does not radiate to leg, the pain gets worse after walking, feels like she had pulled a muscle. She is able to walk. She is tolerating Xeloda very well, no noticeable side effect, except some fatigue but she is able to function well.   All other systems were reviewed with the patient and are negative.  MEDICAL HISTORY:  Past Medical History:  Diagnosis Date  . Abnormal x-ray of lungs with single pulmonary nodule 03/15/2016   per records from [place]  Anemia   . Asthma   . Cholelithiasis 05/19/2017   On CT  . Coronary artery calcification seen on CAT scan 05/19/2017  . Diabetes mellitus without complication ([place])    type 2  . Dilated idiopathic cardiomyopathy ([place]) 12/2015   EF 15-20%. Diagnosed in [facility]  . Headache    NONE RECENT  . Hypertension   . Lymph nodes enlarged 07/25/2020  . Malignant neoplasm of lower-outer quadrant of left breast of female, estrogen receptor negative ([place]) 06/11/2017   left breast  . Mixed hyperlipidemia 06/17/2018  . partially blind   . Personal history of chemotherapy 2018-2019  . Personal history of radiation therapy 2019  . Retinitis pigmentosa   .  Retroperitoneal lymphadenopathy 07/25/2020  . Uveitis     SURGICAL HISTORY: Past Surgical History:  Procedure Laterality Date  . brain cyst removed  2007   to help with headaches per patient , aspirated   . BREAST BIOPSY Left 06/06/2017   x2  . BREAST CYST ASPIRATION Left 06/06/2017  . BREAST LUMPECTOMY Left 06/26/2017  . BREAST LUMPECTOMY WITH RADIOACTIVE SEED AND SENTINEL LYMPH NODE BIOPSY Left 06/26/2017   Procedure: BREAST LUMPECTOMY WITH RADIOACTIVE SEED AND SENTINEL LYMPH NODE BIOPSY ERAS  PATHWAY;  Surgeon: [name], MD;  Location: [place];  Service: General;  Laterality: Left;  pec block  . FINE NEEDLE ASPIRATION Left 02/23/2019   Procedure: FINE NEEDLE ASPIRATION;  Surgeon: [name], MD;  Location: [place];  Service: General;  Laterality: Left;  Asiration of left breast seroma   . IR IMAGING GUIDED PORT INSERTION  10/13/2020  . NASAL TURBINATE REDUCTION    . PORT-A-CATH REMOVAL Right 02/23/2019   Procedure: REMOVAL PORT-A-CATH;  Surgeon: [name], MD;  Location: [place];  Service: General;  Laterality: Right;  . PORTACATH PLACEMENT N/A 06/26/2017   Procedure: INSERTION PORT-A-CATH;  Surgeon: [name], MD;  Location: [place];  Service: General;  Laterality: N/A;  . TRANSTHORACIC ECHOCARDIOGRAM  12/2015   A) [facility] May 2017: Mild concentric LVH. Global hypokinesis. GR daily. EF 18% severe LA dilation. Mitral annular dilatation with papillary muscle dysfunction and moderate MR. Dilated IVC consistent with elevated RAP.  [facility] TRANSTHORACIC ECHOCARDIOGRAM  05/2017; 08/2017   a) Mild concentric LVH.  EF 45 to 50% with diffuse hypokinesis.  GR 1 DD.  Mild aortic root dilation.;; b)  Mild LVH EF 45 to 50%.  Diffuse HK.  No significant valve disease.  No significant change.    I have reviewed the social history and family history with the patient and they are unchanged from previous note.  ALLERGIES:  is allergic to morphine  and related, latex, tylenol with codeine #3 [acetaminophen-codeine], and penicillins.  MEDICATIONS:  Current Outpatient Medications  Medication Sig Dispense Refill  . aspirin 81 MG chewable tablet Chew 81 mg by mouth daily.    . capecitabine (XELODA) 500 MG tablet Take 3 tabs in morning and 4 tabs in evening, every 12hrs, for 14 days then off for 7 days. Repeat every 21 days. Take within 30 minutes after meals. 98 tablet 0  . carvedilol (COREG) 12.5 MG tablet Take 1 tablet (12.5 mg total) by mouth 2 (two) times daily with a meal. 180 tablet 1  . Cholecalciferol (D3-1000 PO) Take by mouth.    . diclofenac (VOLTAREN) 75 MG EC tablet Take 1 tablet (75 mg total) by mouth 2 (two) times daily. 60 tablet 1  . furosemide (LASIX) 20 MG tablet Take  20 mg by mouth daily. Patient is unsure of dose    . glucose blood (ACCU-CHEK GUIDE) test strip Use as instructed to test blood sugar 2 times a day 100 strip 2  . ibuprofen (ADVIL) 800 MG tablet Take 1 tablet (800 mg total) by mouth every 8 (eight) hours as needed. 30 tablet 2  . lidocaine-prilocaine (EMLA) cream Apply 1 application topically as needed. 30 g 0  . linaclotide (LINZESS) 72 MCG capsule Take 1 capsule (72 mcg total) by mouth daily before breakfast. 30 capsule 1  . Multiple Vitamins-Minerals (WOMENS MULTI PO) Take by mouth.    . ondansetron (ZOFRAN) 8 MG tablet Take 1 tablet (8 mg total) by mouth 2 (two) times daily as needed (Nausea or vomiting). 30 tablet 1  . prochlorperazine (COMPAZINE) 10 MG tablet Take 1 tablet (10 mg total) by mouth every 6 (six) hours as needed (Nausea or vomiting). 30 tablet 1  . Saccharomyces boulardii (PROBIOTIC) 250 MG CAPS Take by mouth.    . sacubitril-valsartan (ENTRESTO) 97-103 MG Take 1 tablet by mouth 2 (two) times daily. 180 tablet 3  . spironolactone (ALDACTONE) 25 MG tablet Take 1 tablet (25 mg total) by mouth daily. 90 tablet 3  . vitamin B-12 (CYANOCOBALAMIN) 500 MCG tablet Take 500 mcg by mouth daily.  (Patient not taking: No sig reported)    . vitamin C (ASCORBIC ACID) 500 MG tablet Take 1,000 mg 2 (two) times daily by mouth.     No current facility-administered medications for this visit.    PHYSICAL EXAMINATION: ECOG PERFORMANCE STATUS: 1 - Symptomatic but completely ambulatory  No vitals taken today, Exam not performed today   LABORATORY DATA:  I have reviewed the data as listed CBC Latest Ref Rng & Units 01/01/2021 12/18/2020 12/11/2020  WBC 4.0 - 10.5 K/uL 4.9 6.5 4.3  Hemoglobin 12.0 - 15.0 g/dL 10.4(L) 10.0(L) 10.3(L)  Hematocrit 36.0 - 46.0 % 31.6(L) 30.3(L) 31.4(L)  Platelets 150 - 400 K/uL 328 298 376     CMP Latest Ref Rng & Units 01/01/2021 12/18/2020 12/11/2020  Glucose 70 - 99 mg/dL 222(H) 142(H) 245(H)  BUN 8 - 23 mg/dL '12 12 11  ' Creatinine 0.44 - 1.00 mg/dL 0.82 0.77 0.89  Sodium 135 - 145 mmol/L 139 142 140  Potassium 3.5 - 5.1 mmol/L 4.1 3.4(L) 4.0  Chloride 98 - 111 mmol/L 106 108 106  CO2 22 - 32 mmol/L '26 23 26  ' Calcium 8.9 - 10.3 mg/dL 9.6 9.2 9.3  Total Protein 6.5 - 8.1 g/dL 7.3 7.2 7.2  Total Bilirubin 0.3 - 1.2 mg/dL 0.6 0.5 0.4  Alkaline Phos 38 - 126 U/L 101 104 96  AST 15 - 41 U/L '25 25 22  ' ALT 0 - 44 U/L 20 35 16      RADIOGRAPHIC STUDIES: I have personally reviewed the radiological images as listed and agreed with the findings in the report. No results found.   ASSESSMENT & PLAN:  Auriel Kist is a 65 y.o. female with   1. Left breast invasive ductal carcinoma, stage IB, p(T1cN0M0), Triple negative, Grade 3, in 2018, metastatic disease to right gluteal mass/iliac and diffuse node metastasis in 08/2020 ER 20% weakly + PR/HER2 (0) negative, PD-L1 0% -She was initially diagnosed in 05/2017.She has high risk for recurrence due to triple negative disease. She is s/p left breast lumpectomy, adjuvant chemo TC and Radiation. -Genetictesting was negative for pathogenetic mutations. -Due to recent right gluteal pain, she underwent biopsy on  09/02/20 which  showed metastatic carcinoma, consistent with breast primary. ER 20% positive, ER and HER2 negative.  -I discussed with stage IV metastatic breast cancer, her cancer is no longer curable but still treatable. Goal of therapy is palliative to control her disease and prolong her life. -I started her onfirst-line chemotherapy with weekly Taxolon 2/15/22for 3 weeks on/ 1 week off.I discussed if she does well on chemo for4-6 months, we can change treatment to antiestrogen therapy without chemo, given her ER weekly positivediease. Due to worsened neuropathy, Taxol stopped after C4 on 12/18/20. I switched her to Xeloda on 01/01/21 at 1551m in the AM and 20080min the PM 2 weeks on/1 week off.  -She tolerated first cycle of Xeloda well, will continue same dose -She is scheduled for restaging CT scan next week -Follow-up with late next Friday before cycle 2  2. G2peripheralNeuropathy  -S/p C2D8 Taxol she developed mild tingling in her hands and feet.  -She uses ice bags at homeas needed.  -She continues to have moderate numbness in her hands and light tingling in her feet. She can take B12 or acupuncture. I encouraged her to remain active.  -Taxol stopped after 12/18/20.  -Overall stable  3.Right hip pain, Right iliac bone met, Vit D deficiency -She notes she had recurrent right hip pain since 2020thatradiateddown her right hip and buttock pain. -Her right hip pain is caused bythe large infiltrative mass around the right iliac wing, Secondary to metastatic disease.  -ShecompletedPalliative radiation to right iliac 09/04/20 - 09/22/20 with Dr. MoLisbeth Renshawand her pain is much improved. -Given recent decreased Vit D level, she can continue Vit D 2000-5000 units. I also recommend she take calcium  -Given localized bone met, Istarted her onZometa q3m25month 4/11/22to strengthen her bones and treatment for her bone mets.. -She does not have much pain now, mostly muscle related right  buttock tightness. She still has oxycodone, although not used much. I encouraged her to continue to walk often.   4. HTN,DM,COPD, retinitis pigmentosa with limited vision,Chronic combined systolic and diastolic heart failure, EF 45-50% -Continue to follow-up withPCP Dr HenForest BeckerrdiologistDr HarEllyn Hackhe is legally blind -She notes previously being seen by endocrinologist who had her on DM medication and insulin. Patient stopped medication and seeing her after BG decreased. I will check her A1c with next labs.  5. Anemia -She developed anemia during adjuvant chemotherapy, which resolved after she completed chemo -She had recurrent mild anemia with Hgb 11.7 in 01/2019.Labs on 04/02/2019 showed normal iron, ferritin, folate, and MMA -Remains mild and intermittent.  6.Goal of care discussion  -The patient understands the goal of care is palliative. -she is full code now  7. Financial and social support  -She notes she was getting bills from [place]. I discussed getting more help with our financial advocate.  -She has had episode of deep depression since her metastatic cancer diagnosis. I encouraged her to f/u with SW for counseling. She is interested.   Plan -Continue Xeloda C1D1 at 1500m78m the AM and 2000mg90mthe PM  For the rest of first cycle -restaging CT next week  -lab and f/u on 6/1o before cycle 2 starts on 6/13  No problem-specific Assessment & Plan notes found for this encounter.   No orders of the defined types were placed in this encounter.  I discussed the assessment and treatment plan with the patient. The patient was provided an opportunity to ask questions and all were answered. The patient agreed with the plan and demonstrated  an understanding of the instructions.  The patient was advised to call back or seek an in-person evaluation if the symptoms worsen or if the condition fails to improve as anticipated.  The total time spent in the  appointment was 15 minutes.    [name], MD 01/09/2021   I, Joslyn Devon, am acting as scribe for [name], MD.   I have reviewed the above documentation for accuracy and completeness, and I agree with the above.

## 2021-01-09 ENCOUNTER — Inpatient Hospital Stay (HOSPITAL_BASED_OUTPATIENT_CLINIC_OR_DEPARTMENT_OTHER): Payer: Medicare Other | Admitting: Hematology

## 2021-01-09 ENCOUNTER — Other Ambulatory Visit: Payer: Self-pay

## 2021-01-09 ENCOUNTER — Other Ambulatory Visit (HOSPITAL_COMMUNITY): Payer: Self-pay

## 2021-01-09 ENCOUNTER — Encounter: Payer: Self-pay | Admitting: Hematology

## 2021-01-09 DIAGNOSIS — C50512 Malignant neoplasm of lower-outer quadrant of left female breast: Secondary | ICD-10-CM | POA: Diagnosis not present

## 2021-01-09 DIAGNOSIS — Z171 Estrogen receptor negative status [ER-]: Secondary | ICD-10-CM | POA: Diagnosis not present

## 2021-01-09 MED ORDER — CAPECITABINE 500 MG PO TABS
ORAL_TABLET | ORAL | 0 refills | Status: DC
  Filled 2021-01-09: qty 98, 21d supply, fill #0
  Filled 2021-01-09: qty 98, fill #0

## 2021-01-10 ENCOUNTER — Telehealth: Payer: Self-pay | Admitting: Hematology

## 2021-01-10 NOTE — Telephone Encounter (Signed)
Scheduled follow-up appointment per 5/31 los. Patient is aware.

## 2021-01-12 ENCOUNTER — Ambulatory Visit (INDEPENDENT_AMBULATORY_CARE_PROVIDER_SITE_OTHER): Payer: Medicare Other | Admitting: Family Medicine

## 2021-01-12 ENCOUNTER — Encounter: Payer: Self-pay | Admitting: Family Medicine

## 2021-01-12 ENCOUNTER — Other Ambulatory Visit: Payer: Self-pay

## 2021-01-12 VITALS — BP 120/62 | HR 95 | Ht 68.0 in | Wt 227.4 lb

## 2021-01-12 DIAGNOSIS — I10 Essential (primary) hypertension: Secondary | ICD-10-CM

## 2021-01-12 DIAGNOSIS — G629 Polyneuropathy, unspecified: Secondary | ICD-10-CM | POA: Insufficient documentation

## 2021-01-12 DIAGNOSIS — Z2821 Immunization not carried out because of patient refusal: Secondary | ICD-10-CM

## 2021-01-12 DIAGNOSIS — C50512 Malignant neoplasm of lower-outer quadrant of left female breast: Secondary | ICD-10-CM

## 2021-01-12 DIAGNOSIS — Z171 Estrogen receptor negative status [ER-]: Secondary | ICD-10-CM

## 2021-01-12 DIAGNOSIS — Z0000 Encounter for general adult medical examination without abnormal findings: Secondary | ICD-10-CM

## 2021-01-12 DIAGNOSIS — Z7189 Other specified counseling: Secondary | ICD-10-CM | POA: Diagnosis not present

## 2021-01-12 DIAGNOSIS — E118 Type 2 diabetes mellitus with unspecified complications: Secondary | ICD-10-CM

## 2021-01-12 DIAGNOSIS — C7951 Secondary malignant neoplasm of bone: Secondary | ICD-10-CM

## 2021-01-12 DIAGNOSIS — I2510 Atherosclerotic heart disease of native coronary artery without angina pectoris: Secondary | ICD-10-CM

## 2021-01-12 DIAGNOSIS — F4321 Adjustment disorder with depressed mood: Secondary | ICD-10-CM | POA: Insufficient documentation

## 2021-01-12 NOTE — Patient Instructions (Signed)
  Ms. Nass , Thank you for taking time to come for your Medicare Wellness Visit. I appreciate your ongoing commitment to your health goals. Please review the following plan we discussed and let me know if I can assist you in the future.   These are the goals we discussed:  Follow up with your specialists as recommended.     This is a list of the screening recommended for you and due dates:  Health Maintenance  Topic Date Due  . Pneumococcal vaccine  Never done  . Pneumococcal Vaccination (1 of 4 - PCV13) Never done  . COVID-19 Vaccine (1) Never done  . Tetanus Vaccine  Never done  . Zoster (Shingles) Vaccine (1 of 2) Never done  . Pap Smear  04/07/2020  . Complete foot exam   10/19/2020  . Flu Shot  03/12/2021  . Hemoglobin A1C  05/22/2021  . Mammogram  06/02/2021  . Eye exam for diabetics  07/12/2021  . Colon Cancer Screening  06/10/2023  . Hepatitis C Screening: USPSTF Recommendation to screen - Ages 30-79 yo.  Completed  . HIV Screening  Completed  . HPV Vaccine  Aged Out

## 2021-01-12 NOTE — Progress Notes (Signed)
Kristin Ward is a 65 y.o. female who presents for annual wellness visit, CPE and follow-up on chronic medical conditions.  She has the following concerns:  She would like to have her lipids checked at her next lab visit at the cancer center. Is not on a statin. Previous LDL in normal range.   She is having mood concerns. In support group at cancer center which helps. Having her children around her also helps.   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 No medications. 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  States she has a cardiology appt next month   She continues to decline all immunizations. Will consider getting a Tdap but will run this by her oncologist first.    There is no immunization history on file for this patient. Last Pap smear: gynecologist June 2021  Last mammogram:  2020 Last colonoscopy: 2019- due for recall in 2024  Last DEXA: 2021 Dentist: yes Ophtho: Dr. Katy Ward Exercise: everyday  Other doctors caring for patient include: Dr. Burr Ward- oncology Dr. Selena Batten- cardiology [place]  No longer seeing endocrinologist    Depression screen:  See questionnaire below.  Depression screen [facility] 2/9 01/12/2021 04/05/2020 10/07/2019 04/09/2019 12/16/2018  Decreased Interest 0 0 0 0 0  Down, Depressed, Hopeless 0 0 0 0 0  PHQ - 2 Score 0 0 0 0 0  Some recent data might be hidden    Fall Risk Screen: see questionnaire below. Fall Risk  01/12/2021 04/05/2020 10/07/2019 04/09/2019 12/16/2018  Falls in the past year? 0 1 0 0 0  Number falls in past yr: 0 1 0 - 0  Injury with Fall? 0 1 0 - 0  Risk for fall due to : No Fall Risks - - - -  Follow up Falls evaluation completed - - - -    ADL screen:  See questionnaire below Functional Status Survey: Is the patient deaf or have difficulty hearing?: No Does the patient have difficulty seeing, even when wearing glasses/contacts?: Yes  (blind) Does the patient have difficulty concentrating, remembering, or making decisions?: No Does the patient have difficulty walking or climbing stairs?: No Does the patient have difficulty dressing or bathing?: No Does the patient have difficulty doing errands alone such as visiting a doctor's office or shopping?: No   End of Life Discussion:  Patient does not have a living will and medical power of attorney. Full code. MOST form reviewed and signed.   Review of Systems Constitutional: -fever, -chills, -sweats, -unexpected weight change, -anorexia, -fatigue Allergy: -sneezing, -itching, -congestion Dermatology: denies changing moles, rash, lumps, new worrisome lesions ENT: -runny nose, -ear pain, -sore throat, -hoarseness, -sinus pain, -teeth pain, -tinnitus, -hearing loss, -epistaxis Cardiology:  -chest pain, -palpitations, -edema, -orthopnea, -paroxysmal nocturnal dyspnea Respiratory: -cough, -shortness of breath, -dyspnea on exertion, -wheezing, -hemoptysis Gastroenterology: +abdominal pain, -nausea, -vomiting, -diarrhea, -constipation, -blood in stool, -changes in bowel movement, -dysphagia Hematology: -bleeding or bruising problems Musculoskeletal: -arthralgias, -myalgias, -joint swelling, -back pain, -neck pain, -cramping, -gait changes Ophthalmology: -vision changes, -eye redness, -itching, -discharge Urology: -dysuria, -difficulty urinating, -hematuria, -urinary frequency, -urgency, -incontinence Neurology: -headache, -weakness, -tingling, -numbness, -speech abnormality, -memory loss, -falls, -dizziness Psychology:  +depressed mood, -agitation, -sleep problems    PHYSICAL EXAM:  BP 120/62   Pulse 95   Ht 5\' 8"  (1.727 m)   Wt 227 lb 6.4 oz (103.1 kg)   BMI  34.58 kg/m   General Appearance: Alert, cooperative, no distress, appears stated age Head: Normocephalic, without obvious abnormality, atraumatic Eyes: conjunctiva/corneas clear Ears: Normal TM's and external ear  canals Nose: mask on  Throat: mask on  Neck: Supple, no lymphadenopathy; thyroid: no enlargement/tenderness/nodules Back: Spine nontender, no curvature, ROM normal, no CVA tenderness Lungs: Clear to auscultation bilaterally without wheezes, rales or ronchi; respirations unlabored Chest Wall: No tenderness or deformity Heart: Regular rate and rhythm Breast Exam: not done  Abdomen: Soft, RLQ TTP without rebound or guarding, non-tender otherwise, nondistended, normoactive bowel sounds, no palpable masses  Genitalia: declines Extremities: No clubbing, cyanosis or edema Pulses: 2+ and symmetric all extremities Skin: Skin color, texture, turgor normal, no rashes or lesions. Numerous nevi on face and neck  Lymph nodes: Cervical and supraclavicular normal Neurologic: CNII-XII intact, normal strength, sensation and gait Psych: Normal mood, affect, hygiene and grooming.  ASSESSMENT/PLAN: Medicare annual wellness visit, subsequent -She is here today alone.  Transportation via scat.  She has help at home.  Denies difficulties with ADLs, memory and no falls.  Advanced directives discussed  Routine general medical examination at a health care facility -Preventive health care reviewed.  She is closely followed by specialists.  Recommend healthy diet and staying active as tolerated.  Immunizations reviewed and she declines all immunizations today.  Controlled diabetes mellitus type 2 with complications, unspecified whether long term insulin use (HCC) -Last hemoglobin A1c 6.2% and in prediabetes range.  She is not on medications and is controlling it with diet.  She is no longer seeing endocrinology.  She will continue checking her blood sugars at home  Malignant neoplasm of lower-outer quadrant of left breast of female, estrogen receptor negative (Kristin Ward) -Consider seeing oncology  Bone metastasis (Kristin Ward) -Continue seeing oncology  Coronary artery calcification seen on CAT scan  Essential  hypertension -Controlled.  Followed by cardiology  Neuropathy -Her oncologist has offered gabapentin and she has declined  Situational depression -Discussed that her depression is understandable.  Continue with support groups and therapy.  Advance directive discussed with patient  Immunization declined -She will consider getting a Tdap at her local pharmacy if her oncologist approves of this     Discussed monthly self breast exams and yearly mammograms; at least 30 minutes of aerobic activity at least 5 days/week and weight-bearing exercise 2x/week; proper sunscreen use reviewed; healthy diet, including goals of calcium and vitamin D intake and alcohol recommendations (less than or equal to 1 drink/day) reviewed; regular seatbelt use; changing batteries in smoke detectors.  Immunization recommendations discussed.  Colonoscopy recommendations reviewed   Medicare Attestation I have personally reviewed: The patient's medical and social history Their use of alcohol, tobacco or illicit drugs Their current medications and supplements The patient's functional ability including ADLs,fall risks, home safety risks, cognitive, and hearing and visual impairment Diet and physical activities Evidence for depression or mood disorders  The patient's weight, height, and BMI have been recorded in the chart.  I have made referrals, counseling, and provided education to the patient based on review of the above and I have provided the patient with a written personalized care plan for preventive services.     Harland Dingwall, NP-C   01/12/2021

## 2021-01-15 NOTE — Progress Notes (Signed)
[place]   [phone] [phone]   Clinic Follow up Note   Patient Care Team: Girtha Rm, NP-C as PCP - General (Family Medicine) [name], MD as PCP - Cardiology (Cardiology) [name], MD as Consulting Physician (Hematology) [name], MD as Consulting Physician (General Surgery)  Date of Service:  01/19/2021  CHIEF COMPLAINT: F/u of metastatic left breast cancer   SUMMARY OF ONCOLOGIC HISTORY: Oncology History Overview Note  Cancer Staging Malignant neoplasm of lower-outer quadrant of left breast of female, estrogen receptor negative ([place]) Staging form: Breast, AJCC 8th Edition - Clinical stage from 06/06/2017: Stage IB (cT1c, cN0, cM0, G3, ER: Negative, PR: Negative, HER2: Negative) - Signed by [name], MD on 06/15/2017 - Pathologic stage from 06/26/2017: Stage IB (pT1c, pN0, cM0, G3, ER: Negative, PR: Negative, HER2: Negative) - Signed by [name], MD on 07/10/2017     Malignant neoplasm of lower-outer quadrant of left breast of female, estrogen receptor negative ([place])  05/30/2017 Mammogram   Diagnostic mammo and US IMPRESSION: 1. Highly suspicious 1.4 cm mass in the slightly lower slightly outer left breast -tissue sampling recommended. 2. Indeterminate 0.5 mm mass in the slightly lower slightly outer left breast -tissue sampling recommended. 3. At least 2 left axillary lymph nodes with borderline cortical thickness.    06/06/2017 Initial Biopsy   Diagnosis 1. Breast, left, needle core biopsy, 5:30 o'clock - INVASIVE DUCTAL CARCINOMA, G3 2. Lymph node, needle/core biopsy, left axillary - NO CARCINOMA IDENTIFIED IN ONE LYMPH NODE (0/1)    06/06/2017 Initial Diagnosis   Malignant neoplasm of lower-outer quadrant of left breast of female, estrogen receptor negative ([place])    06/06/2017 Receptors her2   Estrogen Receptor: 0%, NEGATIVE Progesterone Receptor: 0%, NEGATIVE Proliferation Marker Ki67: 70%     06/26/2017 Surgery   LEFT BREAST LUMPECTOMY WITH RADIOACTIVE SEED AND SENTINEL LYMPH NODE BIOPSY ERAS  PATHWAY AND INSERTION PORT-A-CATH By Dr. Barry Dienes on 06/26/17     06/26/2017 Pathology Results   Diagnosis 06/26/17  1. Breast, lumpectomy, Left - INVASIVE DUCTAL CARCINOMA, GRADE III/III, SPANNING 1.2 CM. - THE SURGICAL RESECTION MARGINS ARE NEGATIVE FOR CARCINOMA. - SEE ONCOLOGY TABLE BELOW. 2. Lymph node, sentinel, biopsy, Left axillary #1 - THERE IS NO EVIDENCE OF CARCINOMA IN 1 OF 1 LYMPH NODE (0/1). 3. Lymph node, sentinel, biopsy, Left axillary #2 - THERE IS NO EVIDENCE OF CARCINOMA IN 1 OF 1 LYMPH NODE (0/1). 4. Lymph node, sentinel, biopsy, Left axillary #3 - THERE IS NO EVIDENCE OF CARCINOMA IN 1 OF 1 LYMPH NODE (0/1).     07/25/2017 - 09/29/2017 Chemotherapy   Adjuvant cytoxan and docetaxel (TC) every 3 weekd for 4 cycles      11/19/2017 - 12/17/2017 Radiation Therapy   Adjuvant breast radiation Left breast treated to 42.5 Gy with 17 fx of 2.5 Gy followed by a boost of 7.5 Gy with 3 fx of 2.5 Gy      02/2019 Procedure   She had PAC removal in 02/2019.    07/23/2020 Imaging   MRI Lumbar Spine 07/23/20 IMPRESSION: 1. No significant disc herniation, spinal canal or neural foraminal stenosis at any level. 2. Moderate facet degenerative changes at L3-4, L4-5 and L5-S1. 3. Multiple enlarged retroperitoneal and right iliac lymph nodes. Recommend correlation with CT of the abdomen and pelvis with contrast.   08/15/2020 Imaging   CT AP 08/15/20  IMPRESSION: 1. Right iliac and periaortic adenopathy is noted concerning for metastatic disease or lymphoma. Also noted  is abnormal soft tissue mass anterior and posterior to the right iliac wing concerning for malignancy or metastatic disease. MRI is recommended for further evaluation. 2. Irregular lucency with sclerotic margins is seen involving the right iliac crest concerning for possible lytic lesion. MRI may be performed  for further evaluation. 3. Enlarged fibroid uterus. 4. Small gallstone. 5. Aortic atherosclerosis.   08/22/2020 Pathology Results   FINAL MICROSCOPIC DIAGNOSIS: 08/22/20  A. NEEDLE CORE, RIGHT GLUTEUS MINIMUMUS, BIOPSY:  -  Metastatic carcinoma  -  See comment   COMMENT:   By immunohistochemistry, the neoplastic cells are positive for  cytokeratin-7 and have weak patchy positivity for GATA3 but are negative  for cytokeratin-20 and GCDFP.  The combined morphology and  immunophenotype, support metastasis from the patient's known breast  primary carcinoma.  Dr. Burr Medico was notified of these results on 08/24/2020.  Prognostic markers (ER, PR, Her2, PDL-1) are pending and will be  reported in an addendum   ADDENDUM:   PROGNOSTIC INDICATOR RESULTS:   Immunohistochemical and morphometric analysis performed manually   The tumor cells are NEGATIVE for Her2 (0).   Estrogen Receptor:       POSITIVE, 20%, WEAK STAINING  Progesterone Receptor:   NEGATIVE    PD-L1 (0) negative    09/04/2020 - 09/22/2020 Radiation Therapy   Palliative radiation to right iliac 09/04/20 - 09/22/20, Dr. Lisbeth Renshaw   09/26/2020 -  Chemotherapy   first line weekly Taxol 3 weeks on/1 week off starting 09/26/20 --Reduced to 2 weeks on/1 week off starting with C3 due to neuropathy. Stopped after C4 on 12/18/20 due to neuratrophy.  --Switched to Xeloda on 01/01/21 at $RemoveBe'1500mg'hvdLyECoh$  in the AM and $Remo'2000mg'TGYhx$  in the PM for 2 weeks on/1 week off     10/13/2020 Procedure    PAC placement on 10/13/20.       CURRENT THERAPY:  -Due to Neuropathy from first line Taxol, I switched to Xeloda on 01/01/21 at $RemoveBe'1500mg'DPjPLiBSU$  in the AM and $Remo'2000mg'YoCCu$  in the PM for 2 weeks on/1 week off. Increased to full dose $RemoveB'2000mg'TZVlBGrl$  BID with C2 on 01/22/21.  -Zometa q70months starting 11/20/20   INTERVAL HISTORY:  Kristin Ward is here for a follow up. She was last seen by me 01/09/21. She presents to the clinic alone. She notes since taxol she had right hip pain which is now  more anterior (RLQ) and like she pulled a muscle with tenderness. She notes this feeling does not go away. She thinks this is muscular and feels tender. She notes the numbness in her feet is getting better. She notes she is not walking as much as she did before. She notes she tried to do floor exercises and stretches to help.  She notes she has been off Xeloda since 01/14/21. She plans to start next cycle on Monday. She notes she had to pay $36 for Xeloda but she was told she did not have to pay anything out of pocket at first. She notes she will fast as needed or eating light. She notes she does continue to drink water regardless. She notes she has not lost heavy weight and is not interested in doing so. She notes she did binge sugar and carbs yesterday. She attributes recent increased BG from chemotherapy as it was better controlled off DM medication in 2020-2021. She is not interested in seeing PCP today and rather fix her hyperglycemia with change in diet.     REVIEW OF SYSTEMS:   Constitutional: Denies fevers, chills or abnormal  weight loss Eyes: Denies blurriness of vision Ears, nose, mouth, throat, and face: Denies mucositis or sore throat Respiratory: Denies cough, dyspnea or wheezes Cardiovascular: Denies palpitation, chest discomfort or lower extremity swelling Gastrointestinal:  Denies nausea, heartburn or change in bowel habits Skin: Denies abnormal skin rashes Lymphatics: Denies new lymphadenopathy or easy bruising Neurological:Denies numbness, tingling or new weaknesses Behavioral/Psych: Mood is stable, no new changes  All other systems were reviewed with the patient and are negative.  MEDICAL HISTORY:  Past Medical History:  Diagnosis Date   Abnormal x-ray of lungs with single pulmonary nodule 03/15/2016   per records from [place]    Anemia    Asthma    Cholelithiasis 05/19/2017   On CT   Coronary artery calcification seen on CAT scan 05/19/2017   Diabetes mellitus without  complication ([place])    type 2   Dilated idiopathic cardiomyopathy ([place]) 12/2015   EF 15-20%. Diagnosed in [facility]   Headache    NONE RECENT   Hypertension    Lymph nodes enlarged 07/25/2020   Malignant neoplasm of lower-outer quadrant of left breast of female, estrogen receptor negative ([place]) 06/11/2017   left breast   Mixed hyperlipidemia 06/17/2018   partially blind    Personal history of chemotherapy 2018-2019   Personal history of radiation therapy 2019   Retinitis pigmentosa    Retroperitoneal lymphadenopathy 07/25/2020   Uveitis     SURGICAL HISTORY: Past Surgical History:  Procedure Laterality Date   brain cyst removed  2007   to help with headaches per patient , aspirated    BREAST BIOPSY Left 06/06/2017   x2   BREAST CYST ASPIRATION Left 06/06/2017   BREAST LUMPECTOMY Left 06/26/2017   BREAST LUMPECTOMY WITH RADIOACTIVE SEED AND SENTINEL LYMPH NODE BIOPSY Left 06/26/2017   Procedure: BREAST LUMPECTOMY WITH RADIOACTIVE SEED AND SENTINEL LYMPH NODE BIOPSY ERAS  PATHWAY;  Surgeon: [name], MD;  Location: [place];  Service: General;  Laterality: Left;  pec block   FINE NEEDLE ASPIRATION Left 02/23/2019   Procedure: FINE NEEDLE ASPIRATION;  Surgeon: [name], MD;  Location: [place];  Service: General;  Laterality: Left;  Asiration of left breast seroma    IR IMAGING GUIDED PORT INSERTION  10/13/2020   NASAL TURBINATE REDUCTION     PORT-A-CATH REMOVAL Right 02/23/2019   Procedure: REMOVAL PORT-A-CATH;  Surgeon: [name], MD;  Location: [place];  Service: General;  Laterality: Right;   PORTACATH PLACEMENT N/A 06/26/2017   Procedure: INSERTION PORT-A-CATH;  Surgeon: [name], MD;  Location: [place];  Service: General;  Laterality: N/A;   TRANSTHORACIC ECHOCARDIOGRAM  12/2015   A) [facility] May 2017: Mild concentric LVH. Global hypokinesis. GR daily. EF 18% severe LA dilation. Mitral annular  dilatation with papillary muscle dysfunction and moderate MR. Dilated IVC consistent with elevated RAP.   TRANSTHORACIC ECHOCARDIOGRAM  05/2017; 08/2017   a) Mild concentric LVH.  EF 45 to 50% with diffuse hypokinesis.  GR 1 DD.  Mild aortic root dilation.;; b)  Mild LVH EF 45 to 50%.  Diffuse HK.  No significant valve disease.  No significant change.    I have reviewed the social history and family history with the patient and they are unchanged from previous note.  ALLERGIES:  is allergic to morphine and related, latex, tylenol with codeine #3 [acetaminophen-codeine], and penicillins.  MEDICATIONS:  Current Outpatient Medications  Medication Sig Dispense Refill   aspirin 81 MG chewable tablet Chew 81 mg by  mouth daily.     capecitabine (XELODA) 500 MG tablet Take 4 tabs in morning and 4 tabs in evening, every 12hrs, for 14 days then off for 7 days. Repeat every 21 days. Take within 30 minutes after meals. 114 tablet 1   carvedilol (COREG) 12.5 MG tablet Take 1 tablet (12.5 mg total) by mouth 2 (two) times daily with a meal. 180 tablet 1   Cholecalciferol (D3-1000 PO) Take by mouth.     diclofenac (VOLTAREN) 75 MG EC tablet Take 1 tablet (75 mg total) by mouth 2 (two) times daily. (Patient not taking: Reported on 01/12/2021) 60 tablet 1   furosemide (LASIX) 20 MG tablet Take 20 mg by mouth daily. Patient is unsure of dose     glucose blood (ACCU-CHEK GUIDE) test strip Use as instructed to test blood sugar 2 times a day 100 strip 2   ibuprofen (ADVIL) 800 MG tablet Take 1 tablet (800 mg total) by mouth every 8 (eight) hours as needed. 30 tablet 2   lidocaine-prilocaine (EMLA) cream Apply 1 application topically as needed. 30 g 0   linaclotide (LINZESS) 72 MCG capsule Take 1 capsule (72 mcg total) by mouth daily before breakfast. 30 capsule 1   Multiple Vitamins-Minerals (WOMENS MULTI PO) Take by mouth.     prochlorperazine (COMPAZINE) 10 MG tablet Take 1 tablet (10 mg total) by mouth every 6  (six) hours as needed (Nausea or vomiting). 30 tablet 1   Saccharomyces boulardii (PROBIOTIC) 250 MG CAPS Take by mouth.     sacubitril-valsartan (ENTRESTO) 97-103 MG Take 1 tablet by mouth 2 (two) times daily. 180 tablet 3   spironolactone (ALDACTONE) 25 MG tablet Take 1 tablet (25 mg total) by mouth daily. 90 tablet 3   vitamin B-12 (CYANOCOBALAMIN) 500 MCG tablet Take 500 mcg by mouth daily.     vitamin C (ASCORBIC ACID) 500 MG tablet Take 1,000 mg 2 (two) times daily by mouth.     No current facility-administered medications for this visit.    PHYSICAL EXAMINATION: ECOG PERFORMANCE STATUS: 1 - Symptomatic but completely ambulatory  Vitals:   01/19/21 0845  BP: (!) 146/94  Pulse: 85  Resp: 18  Temp: 98.3 F (36.8 C)  SpO2: 100%   Filed Weights   01/19/21 0845  Weight: 225 lb 6.4 oz (102.2 kg)   Due to COVID19 we will limit examination to appearance. Patient had no complaints.  GENERAL:alert, no distress and comfortable SKIN: skin color normal, no rashes or significant lesions EYES: normal, Conjunctiva are pink and non-injected, sclera clear  NEURO: alert & oriented x 3 with fluent speech   LABORATORY DATA:  I have reviewed the data as listed CBC Latest Ref Rng & Units 01/19/2021 01/01/2021 12/18/2020  WBC 4.0 - 10.5 K/uL 5.0 4.9 6.5  Hemoglobin 12.0 - 15.0 g/dL 11.5(L) 10.4(L) 10.0(L)  Hematocrit 36.0 - 46.0 % 34.1(L) 31.6(L) 30.3(L)  Platelets 150 - 400 K/uL 256 328 298     CMP Latest Ref Rng & Units 01/19/2021 01/01/2021 12/18/2020  Glucose 70 - 99 mg/dL 423(H) 222(H) 142(H)  BUN 8 - 23 mg/dL _0 Creatinine 0.44 - 1.00 mg/dL 0.89 0.82 0.77  Sodium 135 - 145 mmol/L 138 139 142  Potassium 3.5 - 5.1 mmol/L 3.9 4.1 3.4(L)  Chloride 98 - 111 mmol/L 103 106 108  CO2 22 - 32 mmol/L _1 Calcium 8.9 - 10.3 mg/dL 9.3 9.6 9.2  Total Protein 6.5 - 8.1 g/dL 7.8 7.3 7.2  Total Bilirubin 0.3 - 1.2 mg/dL 0.5 0.6 0.5  Alkaline Phos 38 - 126 U/L 116 101 104  AST 15 - 41  U/L 13(L) 25 25  ALT 0 - 44 U/L 11 20 35      RADIOGRAPHIC STUDIES: I have personally reviewed the radiological images as listed and agreed with the findings in the report. CT CHEST ABDOMEN PELVIS WO CONTRAST  Result Date: 01/17/2021 CLINICAL DATA:  Metastatic breast cancer with recent recurrence. Ongoing chemotherapy radiation therapy complete February 2022. EXAM: CT CHEST, ABDOMEN AND PELVIS WITHOUT CONTRAST TECHNIQUE: Multidetector CT imaging of the chest, abdomen and pelvis was performed following the standard protocol without IV contrast. COMPARISON:  PET-CT 09/14/2020 and CT scans 08/15/2020 FINDINGS: CT CHEST FINDINGS Cardiovascular: The heart is normal in size. No pericardial effusion. The aorta is normal in caliber. No atherosclerotic calcifications. Suspect a few small scattered coronary artery calcifications. Mediastinum/Nodes: No mediastinal mass. Difficult to measure the mediastinal and hilar lymph nodes without contrast but when compared to the PET-CT there appears to be definite improvement. 6 mm AP window node on image 24/2 previously measured 11 mm. 4 mm subcarinal node on image 30/2 previously measured 11.5 mm. Lungs/Pleura: No findings suspicious for pulmonary metastatic disease. Mild emphysematous changes are noted. There are radiation changes involving the anterior aspect of the left lung. Musculoskeletal: Stable surgical changes involving the left breast. No axillary or supraclavicular adenopathy. The thyroid gland is grossly normal. The right-sided Port-A-Cath is stable. The bony thorax is intact. No definite lytic or sclerotic bone lesions. CT ABDOMEN PELVIS FINDINGS Hepatobiliary: No hepatic lesions are identified without contrast. No intrahepatic biliary dilatation. Calcified gallstone noted the gallbladder. No common bile duct dilatation. Pancreas: No mass, inflammation or ductal dilatation. Spleen: Normal size.  No focal lesions. Adrenals/Urinary Tract: The adrenal glands and  kidneys are unremarkable. Stable cortical scarring changes involving the left kidney. No worrisome renal lesions or hydronephrosis. The bladder is unremarkable. Stomach/Bowel: The stomach, duodenum, small bowel and colon are grossly normal. Vascular/Lymphatic: Scattered aortic calcifications but no aneurysm. Improved retroperitoneal and pelvic lymphadenopathy. 10.5 mm right common iliac node on image 92/2 previously measured 22 mm. 6.5 mm right external iliac node on image 103/2 previously measured 16 mm. Borderline enlarged right inguinal lymph nodes new since the prior study. There is an 11 mm node on image 132/2 and 12 mm node on image 136/2. These may be reactive and related to treatment. Reproductive: Stable enlarged fibroid uterus. The ovaries are grossly normal. Other: No free pelvic fluid collections. Musculoskeletal: The mixed lytic and sclerotic metastatic bone lesion involving the right iliac bone appears relatively stable. Adjacent tumor infiltration in the iliacus and gluteus minimus muscles appears improved. Maximum diameter on the prior study was 8 cm and is now 4.7 cm. Stable small sclerotic lesion involving the left iliac bone. No new bone lesions are identified. IMPRESSION: 1. Overall improved CT appearance of the chest, abdomen and pelvis as discussed above. Interval decrease in size of the metastatic adenopathy seen on the prior PET-CT. 2. Mixed lytic and sclerotic metastatic bone lesion involving the right iliac bone with interval decrease and surrounding tumor infiltration. 3. Borderline enlarged right inguinal lymph nodes, new since the prior study and possibly reactive and related to treatment. 4. Stable surgical changes involving the left breast and radiation changes involving the anterior aspect of the left lung. 5. Stable enlarged fibroid uterus. 6. Cholelithiasis. 7. Emphysema and aortic atherosclerosis. Aortic Atherosclerosis (ICD10-I70.0) and Emphysema (ICD10-J43.9). Electronically  Signed  By: Marijo Sanes M.D.   On: 01/17/2021 16:29     ASSESSMENT & PLAN:  Kristin Ward is a 65 y.o. female with    1. Left breast invasive ductal carcinoma, stage IB, p(T1cN0M0), Triple negative, Grade 3, in 2018, metastatic disease to right gluteal mass/iliac and diffuse node metastasis in 08/2020 ER 20% weakly + PR/HER2 (0) negative, PD-L1 0% -She was initially diagnosed in 05/2017. She has high risk for recurrence due to triple negative disease. She is s/p left breast lumpectomy, adjuvant chemo TC and Radiation.  -Genetic testing was negative for pathogenetic mutations.  -Due to recent right gluteal pain, she underwent biopsy on 09/02/20 which showed metastatic carcinoma, consistent with breast primary. ER 20% positive, ER and HER2 negative.  -I discussed with stage IV metastatic breast cancer, her cancer is no longer curable but still treatable. Goal of therapy is palliative to control her disease and prolong her life.  -I started her on first-line chemotherapy with weekly Taxol on 09/26/20 for 3 weeks on/ 1 week off. I discussed if she does well on chemo for 4-6 months, we can change treatment to antiestrogen therapy without chemo, given her ER weekly positive diease. Due to worsened neuropathy, Taxol stopped after C4 on 12/18/20. I switched her to Xeloda on 01/01/21 at $RemoveBe'1500mg'YtHjelCKb$  in the AM and $Remo'2000mg'FEYJJ$  in the PM 2 weeks on/1 week off.  -Her CT CAP from 01/17/21 which showed overall improved disease and interval decrease in size of metastatic adenopathy and mixed lytic and sclerotic bone mets. She does have stable surgical changes of left breast and borderline enlarged right inguinal LN which is new since last scan, possible reactive. I personally reviewed and discussed scan findings in great detail with patient today.  -She is tolerating Xeloda well with manageable fatigue.  Labs reviewed, Hg 11.5, BG 423. Overall adequate to proceed with Xeloda. Plan to start C2 on 01/22/21 at full dose $RemoveB'2000mg'LFdlWUXh$  BID.  I encouraged her to watch for significant skin toxicity or fatigue. Will reduce dose if not tolerable.  -f/u in 3 weeks before cycle 3     2. G2 peripheral Neuropathy  -S/p C2D8 Taxol she developed mild tingling in her hands and feet.  -She uses ice bags at home as needed.  -She continues to have moderate numbness in her hands and light tingling in her feet. She can take B12 or acupuncture. I encouraged her to remain active.  -Taxol stopped after 12/18/20. Her neuropathy is improving.  -Overall stable   3. Right hip pain, Right iliac bone met, Vit D deficiency -She notes she had recurrent right hip pain since 2020 that radiated down her right hip and buttock pain. -Her right hip pain is caused by the large infiltrative mass around the right iliac wing, Secondary to metastatic disease.  -She completed Palliative radiation to right iliac 09/04/20 - 09/22/20 with Dr. Lisbeth Renshaw, and her pain is much improved.  -Given recent decreased Vit D level, she can continue Vit D 2000-5000 units. I also recommend she take calcium  -Given localized bone met, I started her on Zometa q30months on 11/20/20  to strengthen her bones and treatment for her bone mets..   -She does not have much pain now, mostly muscle related right buttock tightness. This pain now radiates to RLQ area. Based on 01/17/21 CT CAP, there is no obvious cause. She still has oxycodone, although not used much. I encouraged her to continue to walk often and exercise.    4. HTN, DM,  COPD, retinitis pigmentosa with limited vision, Chronic combined systolic and diastolic heart failure, EF 45-50% -Continue to follow-up with PCP Dr Raenette Rover and cardiologist Dr Ellyn Hack  -She is legally blind -She notes previously being seen by endocrinologist who had her on DM medication and insulin. Patient stopped medication in early 2021 after BG decreased and stabilized in May 2020. Her 11/20/20 A1c was 6.2.  -Hg BG 423 today (01/19/21) after she notes she binged on sugar and  carbs yesterday. I advised her to see her PCP or ED for management of her hyperglycemia today. She declined and rather change her diet. I also advised her to avoid sweats and reduce carbohydrates in her diet and increase intake. She is agreeable.  -will fax her lab results to her PCP    5. Anemia -She developed anemia during adjuvant chemotherapy, which resolved after she completed chemo -She had recurrent mild anemia with Hgb 11.7 in 01/2019. Labs on 04/02/2019 showed normal iron, ferritin, folate, and MMA -Remains mild and intermittent.    6. Goal of care discussion  -The patient understands the goal of care is palliative. -she is full code now    7. Financial and social support  -She notes she was getting bills from [place]. I discussed getting more help with our financial advocate.  -She has had episode of deep depression since her metastatic cancer diagnosis. I encouraged her to f/u with SW for counseling. She is interested.      Plan -Continue Xeloda, increase to full dose 2012m BID with C2 starting 01/22/21. [email or web address]  -Lab, flush and F/u in 3 weeks -Will notify her PCP about her severe hyperglycemia. Pt declined metformin and will watch her diet. I encourage her to f/u with PCP as soon as possible    No problem-specific Assessment & Plan notes found for this encounter.   No orders of the defined types were placed in this encounter.  All questions were answered. The patient knows to call the clinic with any problems, questions or concerns. No barriers to learning was detected. The total time spent in the appointment was 3 minutes.     YTruitt Merle MD 01/19/2021   I, AJoslyn Devon am acting as scribe for YTruitt Merle MD.   I have reviewed the above documentation for accuracy and completeness, and I agree with the above.

## 2021-01-16 ENCOUNTER — Other Ambulatory Visit (HOSPITAL_COMMUNITY): Payer: Self-pay

## 2021-01-17 ENCOUNTER — Encounter (HOSPITAL_COMMUNITY): Payer: Self-pay

## 2021-01-17 ENCOUNTER — Ambulatory Visit (HOSPITAL_COMMUNITY)
Admission: RE | Admit: 2021-01-17 | Discharge: 2021-01-17 | Disposition: A | Discharge status: Home / Self Care | Payer: Medicare Other | Source: Ambulatory Visit | Attending: Hematology | Admitting: Hematology

## 2021-01-17 DIAGNOSIS — C799 Secondary malignant neoplasm of unspecified site: Secondary | ICD-10-CM | POA: Insufficient documentation

## 2021-01-17 DIAGNOSIS — C50512 Malignant neoplasm of lower-outer quadrant of left female breast: Secondary | ICD-10-CM | POA: Diagnosis not present

## 2021-01-17 DIAGNOSIS — Z171 Estrogen receptor negative status [ER-]: Secondary | ICD-10-CM | POA: Diagnosis not present

## 2021-01-17 DIAGNOSIS — K8020 Calculus of gallbladder without cholecystitis without obstruction: Secondary | ICD-10-CM | POA: Diagnosis not present

## 2021-01-17 DIAGNOSIS — C50919 Malignant neoplasm of unspecified site of unspecified female breast: Secondary | ICD-10-CM | POA: Diagnosis not present

## 2021-01-17 DIAGNOSIS — D492 Neoplasm of unspecified behavior of bone, soft tissue, and skin: Secondary | ICD-10-CM | POA: Diagnosis not present

## 2021-01-17 DIAGNOSIS — C7951 Secondary malignant neoplasm of bone: Secondary | ICD-10-CM | POA: Diagnosis not present

## 2021-01-17 DIAGNOSIS — J439 Emphysema, unspecified: Secondary | ICD-10-CM | POA: Diagnosis not present

## 2021-01-19 ENCOUNTER — Other Ambulatory Visit: Payer: Self-pay | Admitting: Hematology

## 2021-01-19 ENCOUNTER — Inpatient Hospital Stay: Payer: Medicare Other | Attending: Hematology | Admitting: Hematology

## 2021-01-19 ENCOUNTER — Telehealth: Payer: Self-pay | Admitting: Hematology

## 2021-01-19 ENCOUNTER — Inpatient Hospital Stay: Payer: Medicare Other

## 2021-01-19 ENCOUNTER — Other Ambulatory Visit: Payer: Self-pay

## 2021-01-19 ENCOUNTER — Telehealth: Payer: Self-pay | Admitting: Internal Medicine

## 2021-01-19 ENCOUNTER — Other Ambulatory Visit (HOSPITAL_COMMUNITY): Payer: Self-pay

## 2021-01-19 ENCOUNTER — Encounter: Payer: Self-pay | Admitting: Hematology

## 2021-01-19 VITALS — BP 146/94 | HR 85 | Temp 98.3°F | Resp 18 | Wt 225.4 lb

## 2021-01-19 DIAGNOSIS — Z452 Encounter for adjustment and management of vascular access device: Secondary | ICD-10-CM | POA: Insufficient documentation

## 2021-01-19 DIAGNOSIS — D6481 Anemia due to antineoplastic chemotherapy: Secondary | ICD-10-CM | POA: Diagnosis not present

## 2021-01-19 DIAGNOSIS — C7951 Secondary malignant neoplasm of bone: Secondary | ICD-10-CM | POA: Insufficient documentation

## 2021-01-19 DIAGNOSIS — J449 Chronic obstructive pulmonary disease, unspecified: Secondary | ICD-10-CM | POA: Diagnosis not present

## 2021-01-19 DIAGNOSIS — C50512 Malignant neoplasm of lower-outer quadrant of left female breast: Secondary | ICD-10-CM

## 2021-01-19 DIAGNOSIS — E119 Type 2 diabetes mellitus without complications: Secondary | ICD-10-CM | POA: Diagnosis not present

## 2021-01-19 DIAGNOSIS — I5042 Chronic combined systolic (congestive) and diastolic (congestive) heart failure: Secondary | ICD-10-CM | POA: Diagnosis not present

## 2021-01-19 DIAGNOSIS — I110 Hypertensive heart disease with heart failure: Secondary | ICD-10-CM | POA: Diagnosis not present

## 2021-01-19 DIAGNOSIS — Z171 Estrogen receptor negative status [ER-]: Secondary | ICD-10-CM

## 2021-01-19 DIAGNOSIS — E559 Vitamin D deficiency, unspecified: Secondary | ICD-10-CM | POA: Diagnosis not present

## 2021-01-19 DIAGNOSIS — Z95828 Presence of other vascular implants and grafts: Secondary | ICD-10-CM

## 2021-01-19 DIAGNOSIS — H3552 Pigmentary retinal dystrophy: Secondary | ICD-10-CM | POA: Diagnosis not present

## 2021-01-19 LAB — CBC WITH DIFFERENTIAL/PLATELET
Abs Immature Granulocytes: 0.02 10*3/uL (ref 0.00–0.07)
Basophils Absolute: 0 10*3/uL (ref 0.0–0.1)
Basophils Relative: 0 %
Eosinophils Absolute: 0.2 10*3/uL (ref 0.0–0.5)
Eosinophils Relative: 4 %
HCT: 34.1 % — ABNORMAL LOW (ref 36.0–46.0)
Hemoglobin: 11.5 g/dL — ABNORMAL LOW (ref 12.0–15.0)
Immature Granulocytes: 0 %
Lymphocytes Relative: 16 %
Lymphs Abs: 0.8 10*3/uL (ref 0.7–4.0)
MCH: 29.9 pg (ref 26.0–34.0)
MCHC: 33.7 g/dL (ref 30.0–36.0)
MCV: 88.6 fL (ref 80.0–100.0)
Monocytes Absolute: 0.4 10*3/uL (ref 0.1–1.0)
Monocytes Relative: 9 %
Neutro Abs: 3.6 10*3/uL (ref 1.7–7.7)
Neutrophils Relative %: 71 %
Platelets: 256 10*3/uL (ref 150–400)
RBC: 3.85 MIL/uL — ABNORMAL LOW (ref 3.87–5.11)
RDW: 15.9 % — ABNORMAL HIGH (ref 11.5–15.5)
WBC: 5 10*3/uL (ref 4.0–10.5)
nRBC: 0 % (ref 0.0–0.2)

## 2021-01-19 LAB — COMPREHENSIVE METABOLIC PANEL
ALT: 11 U/L (ref 0–44)
AST: 13 U/L — ABNORMAL LOW (ref 15–41)
Albumin: 3.8 g/dL (ref 3.5–5.0)
Alkaline Phosphatase: 116 U/L (ref 38–126)
Anion gap: 10 (ref 5–15)
BUN: 10 mg/dL (ref 8–23)
CO2: 25 mmol/L (ref 22–32)
Calcium: 9.3 mg/dL (ref 8.9–10.3)
Chloride: 103 mmol/L (ref 98–111)
Creatinine, Ser: 0.89 mg/dL (ref 0.44–1.00)
GFR, Estimated: >60 mL/min (ref >60)
Glucose, Bld: 423 mg/dL — ABNORMAL HIGH (ref 70–99)
Potassium: 3.9 mmol/L (ref 3.5–5.1)
Sodium: 138 mmol/L (ref 135–145)
Total Bilirubin: 0.5 mg/dL (ref 0.3–1.2)
Total Protein: 7.8 g/dL (ref 6.5–8.1)

## 2021-01-19 MED ORDER — SODIUM CHLORIDE 0.9% FLUSH
10.0000 mL | Freq: Once | INTRAVENOUS | Status: AC | PRN
  Administered 2021-01-19: 10 mL
  Filled 2021-01-19: qty 10

## 2021-01-19 MED ORDER — CAPECITABINE 500 MG PO TABS
ORAL_TABLET | ORAL | 1 refills | Status: DC
  Filled 2021-01-19 – 2021-02-06 (×2): qty 114, 21d supply, fill #0

## 2021-01-19 MED ORDER — HEPARIN SOD (PORK) LOCK FLUSH 100 UNIT/ML IV SOLN
500.0000 [IU] | Freq: Once | INTRAVENOUS | Status: AC | PRN
  Administered 2021-01-19: 500 [IU]
  Filled 2021-01-19: qty 5

## 2021-01-19 NOTE — Telephone Encounter (Signed)
Scheduled per los. Gave avs and calendar  

## 2021-01-19 NOTE — Telephone Encounter (Signed)
Pt's blood sugar was 432 today at oncology. Pt had not eaten this morning when getting labs done. A1c 2 months ago was 6.2 she has not been checking BS and just ate dinner.    Per vickie restart metformin 1000mg  twice a day and check fasting in am and then 2 hours after a meal. Fasting means nothing to eat or drink before taking  blood sugar. Write numbers down. Bring in your readings in next Friday 01/26/21  @ 2:30pm for your appointment with Vickie. If your blood sugars is over 500 GO TO ER, DO NOT WAIT. If your blood sugars can not be read by your meter then it means its too high and you must go to ER.

## 2021-01-19 NOTE — Progress Notes (Signed)
CMP from today faxed to Harland Dingwall, NP PCP.

## 2021-01-22 ENCOUNTER — Telehealth: Payer: Self-pay | Admitting: Family Medicine

## 2021-01-22 ENCOUNTER — Other Ambulatory Visit (HOSPITAL_COMMUNITY): Payer: Self-pay

## 2021-01-22 NOTE — Telephone Encounter (Signed)
Pt was notified. Pt states her BS has been in the 300-400s

## 2021-01-22 NOTE — Telephone Encounter (Signed)
Please call pt is wanting to make sure that it is ok for her to take her metformin and   Capectitabine together

## 2021-01-23 ENCOUNTER — Other Ambulatory Visit (HOSPITAL_COMMUNITY): Payer: Self-pay

## 2021-01-24 NOTE — Progress Notes (Signed)
[facility]   [phone] [phone]   Clinic Follow up Note   Patient Care Team: Henson, Vickie L, NP-C as PCP - General (Family Medicine) Harding, David W, MD as PCP - Cardiology (Cardiology) Feng, Yan, MD as Consulting Physician (Hematology) Byerly, Faera, MD as Consulting Physician (General Surgery)  Date of Service:  01/29/2021  I connected with Kristin Ward on 01/29/2021 at  9:20 AM EDT by telephone visit and verified that I am speaking with the correct person using two identifiers.  I discussed the limitations, risks, security and privacy concerns of performing an evaluation and management service by telephone and the availability of in person appointments. I also discussed with the patient that there may be a patient responsible charge related to this service. The patient expressed understanding and agreed to proceed.   Other persons participating in the visit and their role in the encounter:  None  Patient's location:  Her home  Provider's location:  My office   CHIEF COMPLAINT: F/u of metastatic left breast cancer   SUMMARY OF ONCOLOGIC HISTORY: Oncology History Overview Note  Cancer Staging Malignant neoplasm of lower-outer quadrant of left breast of female, estrogen receptor negative (HCC) Staging form: Breast, AJCC 8th Edition - Clinical stage from 06/06/2017: Stage IB (cT1c, cN0, cM0, G3, ER: Negative, PR: Negative, HER2: Negative) - Signed by Feng, Yan, MD on 06/15/2017 - Pathologic stage from 06/26/2017: Stage IB (pT1c, pN0, cM0, G3, ER: Negative, PR: Negative, HER2: Negative) - Signed by Feng, Yan, MD on 07/10/2017     Malignant neoplasm of lower-outer quadrant of left breast of female, estrogen receptor negative (HCC)  05/30/2017 Mammogram   Diagnostic mammo and US IMPRESSION: 1. Highly suspicious 1.4 cm mass in the slightly lower slightly outer left breast -tissue sampling recommended. 2. Indeterminate 0.5 mm mass in  the slightly lower slightly outer left breast -tissue sampling recommended. 3. At least 2 left axillary lymph nodes with borderline cortical thickness.    06/06/2017 Initial Biopsy   Diagnosis 1. Breast, left, needle core biopsy, 5:30 o'clock - INVASIVE DUCTAL CARCINOMA, G3 2. Lymph node, needle/core biopsy, left axillary - NO CARCINOMA IDENTIFIED IN ONE LYMPH NODE (0/1)    06/06/2017 Initial Diagnosis   Malignant neoplasm of lower-outer quadrant of left breast of female, estrogen receptor negative (HCC)    06/06/2017 Receptors her2   Estrogen Receptor: 0%, NEGATIVE Progesterone Receptor: 0%, NEGATIVE Proliferation Marker Ki67: 70%    06/26/2017 Surgery   LEFT BREAST LUMPECTOMY WITH RADIOACTIVE SEED AND SENTINEL LYMPH NODE BIOPSY ERAS  PATHWAY AND INSERTION PORT-A-CATH By Dr. Byerly on 06/26/17     06/26/2017 Pathology Results   Diagnosis 06/26/17  1. Breast, lumpectomy, Left - INVASIVE DUCTAL CARCINOMA, GRADE III/III, SPANNING 1.2 CM. - THE SURGICAL RESECTION MARGINS ARE NEGATIVE FOR CARCINOMA. - SEE ONCOLOGY TABLE BELOW. 2. Lymph node, sentinel, biopsy, Left axillary #1 - THERE IS NO EVIDENCE OF CARCINOMA IN 1 OF 1 LYMPH NODE (0/1). 3. Lymph node, sentinel, biopsy, Left axillary #2 - THERE IS NO EVIDENCE OF CARCINOMA IN 1 OF 1 LYMPH NODE (0/1). 4. Lymph node, sentinel, biopsy, Left axillary #3 - THERE IS NO EVIDENCE OF CARCINOMA IN 1 OF 1 LYMPH NODE (0/1).     07/25/2017 - 09/29/2017 Chemotherapy   Adjuvant cytoxan and docetaxel (TC) every 3 weekd for 4 cycles      11/19/2017 - 12/17/2017 Radiation Therapy   Adjuvant breast radiation Left breast treated to 42.5 Gy with 17 fx of 2.5 Gy   followed by a boost of 7.5 Gy with 3 fx of 2.5 Gy      02/2019 Procedure   She had PAC removal in 02/2019.    07/23/2020 Imaging   MRI Lumbar Spine 07/23/20 IMPRESSION: 1. No significant disc herniation, spinal canal or neural foraminal stenosis at any level. 2. Moderate facet  degenerative changes at L3-4, L4-5 and L5-S1. 3. Multiple enlarged retroperitoneal and right iliac lymph nodes. Recommend correlation with CT of the abdomen and pelvis with contrast.   08/15/2020 Imaging   CT AP 08/15/20  IMPRESSION: 1. Right iliac and periaortic adenopathy is noted concerning for metastatic disease or lymphoma. Also noted is abnormal soft tissue mass anterior and posterior to the right iliac wing concerning for malignancy or metastatic disease. MRI is recommended for further evaluation. 2. Irregular lucency with sclerotic margins is seen involving the right iliac crest concerning for possible lytic lesion. MRI may be performed for further evaluation. 3. Enlarged fibroid uterus. 4. Small gallstone. 5. Aortic atherosclerosis.   08/22/2020 Pathology Results   FINAL MICROSCOPIC DIAGNOSIS: 08/22/20  A. NEEDLE CORE, RIGHT GLUTEUS MINIMUMUS, BIOPSY:  -  Metastatic carcinoma  -  See comment   COMMENT:   By immunohistochemistry, the neoplastic cells are positive for  cytokeratin-7 and have weak patchy positivity for GATA3 but are negative  for cytokeratin-20 and GCDFP.  The combined morphology and  immunophenotype, support metastasis from the patient's known breast  primary carcinoma.  Dr. Burr Medico was notified of these results on 08/24/2020.  Prognostic markers (ER, PR, Her2, PDL-1) are pending and will be  reported in an addendum   ADDENDUM:   PROGNOSTIC INDICATOR RESULTS:   Immunohistochemical and morphometric analysis performed manually   The tumor cells are NEGATIVE for Her2 (0).   Estrogen Receptor:       POSITIVE, 20%, WEAK STAINING  Progesterone Receptor:   NEGATIVE    PD-L1 (0) negative    09/04/2020 - 09/22/2020 Radiation Therapy   Palliative radiation to right iliac 09/04/20 - 09/22/20, Dr. Lisbeth Renshaw   09/26/2020 -  Chemotherapy   first line weekly Taxol 3 weeks on/1 week off starting 09/26/20 --Reduced to 2 weeks on/1 week off starting with C3 due to  neuropathy. Stopped after C4 on 12/18/20 due to neuratrophy.  --Switched to Xeloda on 01/01/21 at 1554m in the AM and 20041min the PM for 2 weeks on/1 week off     10/13/2020 Procedure    PAC placement on 10/13/20.       CURRENT THERAPY:  -Due to Neuropathy from first line Taxol, I switched to Xeloda on 01/01/21 at 150056mn the AM and 2000m45m the PM for 2 weeks on/1 week off. Increased to full dose 2000mg101m with C2 on 01/22/21.  -Zometa q3mont67monthting 11/20/20   INTERVAL HISTORY:  Kristin Ward is here for a follow up. She was last seen by me 01/19/21. She presents to the clinic alone. She notes on full dose Xeloda she is tolerating well with no major side effects. She notes she was started on insulin by her PCP. She was also started on Metformin. She notes she is trying to eat better.     REVIEW OF SYSTEMS:   Constitutional: Denies fevers, chills or abnormal weight loss Eyes: Denies blurriness of vision Ears, nose, mouth, throat, and face: Denies mucositis or sore throat Respiratory: Denies cough, dyspnea or wheezes Cardiovascular: Denies palpitation, chest discomfort or lower extremity swelling Gastrointestinal:  Denies nausea, heartburn or change in bowel habits  Skin: Denies abnormal skin rashes Lymphatics: Denies new lymphadenopathy or easy bruising Neurological:Denies numbness, tingling or new weaknesses Behavioral/Psych: Mood is stable, no new changes  All other systems were reviewed with the patient and are negative.  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 Diagnosed in [place]/[facility]   Headache    NONE  RECENT   Hypertension    Lymph nodes enlarged 07/25/2020   Malignant neoplasm of lower-outer quadrant of left breast of female, estrogen receptor negative (HCC) 06/11/2017   left breast   Mixed hyperlipidemia 06/17/2018   partially blind    Personal history of chemotherapy 2018-2019   Personal history of radiation therapy 2019   Retinitis pigmentosa    Retroperitoneal lymphadenopathy 07/25/2020   Uveitis     SURGICAL HISTORY: Past Surgical History:  Procedure Laterality Date   brain cyst removed  2007   to help with headaches per patient , aspirated    BREAST BIOPSY Left 06/06/2017   x2   BREAST CYST ASPIRATION Left 06/06/2017   BREAST LUMPECTOMY Left 06/26/2017   BREAST LUMPECTOMY WITH RADIOACTIVE SEED AND SENTINEL LYMPH NODE BIOPSY Left 06/26/2017   Procedure: BREAST LUMPECTOMY WITH RADIOACTIVE SEED AND SENTINEL LYMPH NODE BIOPSY ERAS  PATHWAY;  Surgeon: Byerly, Faera, MD;  Location: [facility] OR;  Service: General;  Laterality: Left;  pec block   FINE NEEDLE ASPIRATION Left 02/23/2019   Procedure: FINE NEEDLE ASPIRATION;  Surgeon: Byerly, Faera, MD;  Location: [facility];  Service: General;  Laterality: Left;  Asiration of left breast seroma    IR IMAGING GUIDED PORT INSERTION  10/13/2020   NASAL TURBINATE REDUCTION     PORT-A-CATH REMOVAL Right 02/23/2019   Procedure: REMOVAL PORT-A-CATH;  Surgeon: Byerly, Faera, MD;  Location: [facility];  Service: General;  Laterality: Right;   PORTACATH PLACEMENT N/A 06/26/2017   Procedure: INSERTION PORT-A-CATH;  Surgeon: Byerly, Faera, MD;  Location: [facility] OR;  Service: General;  Laterality: N/A;   TRANSTHORACIC ECHOCARDIOGRAM  12/2015   A) [facility] May 2017: Mild concentric LVH. Global hypokinesis. GR daily. EF 18% severe LA dilation. Mitral annular dilatation with papillary muscle dysfunction and moderate MR. Dilated IVC consistent with elevated RAP.   TRANSTHORACIC ECHOCARDIOGRAM  05/2017; 08/2017   a) Mild  concentric LVH.  EF 45 to 50% with diffuse hypokinesis.  GR 1 DD.  Mild aortic root dilation.;; b)  Mild LVH EF 45 to 50%.  Diffuse HK.  No significant valve disease.  No significant change.    I have reviewed the social history and family history with the patient and they are unchanged from previous note.  ALLERGIES:  is allergic to morphine and related, latex, tylenol with codeine #3 [acetaminophen-codeine], and penicillins.  MEDICATIONS:  Current Outpatient Medications  Medication Sig Dispense Refill   Accu-Chek FastClix Lancets MISC TEST TWICE A DAY. PT USES AN ACCU-CHEK GUIDE ME METER 102 each 2   aspirin 81 MG chewable tablet Chew 81 mg by mouth daily.     capecitabine (XELODA) 500 MG tablet Take 3 tabs in morning and 4 tabs in evening, every 12hrs,   for 14 days then off for 7 days. Repeat every 21 days. Take within 30 minutes after meals. 98 tablet 0   capecitabine (XELODA) 500 MG tablet Take 4 tabs in morning and 4 tabs in evening, every 12hrs, for 14 days then off for 7 days. Repeat every 21 days. Take within 30 minutes after meals. 114 tablet 1   carvedilol (COREG) 12.5 MG tablet Take 1 tablet (12.5 mg total) by mouth 2 (two) times daily with a meal. 180 tablet 1   Cholecalciferol (D3-1000 PO) Take by mouth.     diclofenac (VOLTAREN) 75 MG EC tablet Take 1 tablet (75 mg total) by mouth 2 (two) times daily. 60 tablet 1   furosemide (LASIX) 20 MG tablet Take 20 mg by mouth daily. Patient is unsure of dose     glucose blood (ACCU-CHEK GUIDE) test strip Use as instructed to test blood sugar 2 times a day 100 strip 2   ibuprofen (ADVIL) 800 MG tablet Take 1 tablet (800 mg total) by mouth every 8 (eight) hours as needed. 30 tablet 2   Insulin Glargine (BASAGLAR KWIKPEN) 100 UNIT/ML Inject 10 Units into the skin at bedtime.     lidocaine-prilocaine (EMLA) cream Apply 1 application topically as needed. 30 g 0   linaclotide (LINZESS) 72 MCG capsule Take 1 capsule (72 mcg total) by mouth daily  before breakfast. 30 capsule 1   metFORMIN (GLUCOPHAGE) 500 MG tablet Take 500 mg by mouth 2 (two) times daily with a meal.     Multiple Vitamins-Minerals (WOMENS MULTI PO) Take by mouth.     prochlorperazine (COMPAZINE) 10 MG tablet Take 1 tablet (10 mg total) by mouth every 6 (six) hours as needed (Nausea or vomiting). 30 tablet 1   Saccharomyces boulardii (PROBIOTIC) 250 MG CAPS Take by mouth.     sacubitril-valsartan (ENTRESTO) 97-103 MG Take 1 tablet by mouth 2 (two) times daily. 180 tablet 3   Semaglutide (RYBELSUS) 3 MG TABS Take by mouth.     spironolactone (ALDACTONE) 25 MG tablet Take 1 tablet (25 mg total) by mouth daily. 90 tablet 3   vitamin B-12 (CYANOCOBALAMIN) 500 MCG tablet Take 500 mcg by mouth daily.     vitamin C (ASCORBIC ACID) 500 MG tablet Take 1,000 mg 2 (two) times daily by mouth.     No current facility-administered medications for this visit.    PHYSICAL EXAMINATION: ECOG PERFORMANCE STATUS: 1 - Symptomatic but completely ambulatory   No vitals taken today, Exam not performed today   LABORATORY DATA:  I have reviewed the data as listed CBC Latest Ref Rng & Units 01/29/2021 01/19/2021 01/01/2021  WBC 4.0 - 10.5 K/uL 7.0 5.0 4.9  Hemoglobin 12.0 - 15.0 g/dL 11.9(L) 11.5(L) 10.4(L)  Hematocrit 36.0 - 46.0 % 35.3(L) 34.1(L) 31.6(L)  Platelets 150 - 400 K/uL 239 256 328     CMP Latest Ref Rng & Units 01/29/2021 01/19/2021 01/01/2021  Glucose 70 - 99 mg/dL 288(H) 423(H) 222(H)  BUN 8 - 23 mg/dL 14 10 12  Creatinine 0.44 - 1.00 mg/dL 0.74 0.89 0.82  Sodium 135 - 145 mmol/L 136 138 139  Potassium 3.5 - 5.1 mmol/L 4.2 3.9 4.1  Chloride 98 - 111 mmol/L 105 103 106  CO2 22 - 32 mmol/L 26 25 26  Calcium 8.9 - 10.3 mg/dL 9.5 9.3 9.6  Total Protein 6.5 - 8.1 g/dL 7.8 7.8 7.3  Total Bilirubin 0.3 - 1.2 mg/dL 0.5 0.5 0.6  Alkaline Phos 38 - 126 U/L 98   116 101  AST 15 - 41 U/L 17 13(L) 25  ALT 0 - 44 U/L 16 11 20      RADIOGRAPHIC STUDIES: I have personally  reviewed the radiological images as listed and agreed with the findings in the report. No results found.   ASSESSMENT & PLAN:  Kristin Ward is a 64 y.o. female with   1. Left breast invasive ductal carcinoma, stage IB, p(T1cN0M0), Triple negative, Grade 3, in 2018, metastatic disease to right gluteal mass/iliac and diffuse node metastasis in 08/2020 ER 20% weakly + PR/HER2 (0) negative, PD-L1 0% -She was initially diagnosed in 05/2017. She has high risk for recurrence due to triple negative disease. She is s/p left breast lumpectomy, adjuvant chemo TC and Radiation.  -Genetic testing was negative for pathogenetic mutations.  -Due to recent right gluteal pain, she underwent biopsy on 09/02/20 which showed metastatic carcinoma, consistent with breast primary. ER 20% positive, ER and HER2 negative. -I discussed with stage IV metastatic breast cancer, her cancer is no longer curable but still treatable. Goal of therapy is palliative to control her disease and prolong her life.  -I started her on first-line chemotherapy with weekly Taxol on 09/26/20 for 3 weeks on/ 1 week off. I discussed if she does well on chemo for 4-6 months, we can change treatment to antiestrogen therapy without chemo, given her ER weekly positive diease. Due to worsened neuropathy, Taxol stopped after C4 on 12/18/20. I switched her to Xeloda on 01/01/21 at 1500mg in the AM and 2000mg in the PM 2 weeks on/1 week off. Increased to full dose 2000mg BID on 01/22/21.  -Her CT CAP from 01/17/21 showed overall improved disease and interval decrease in size of metastatic adenopathy and mixed lytic and sclerotic bone mets. She does have stable surgical changes of left breast and borderline enlarged right inguinal LN which is new since last scan, possible reactive.  -She tolerated the first week of full dose well overall. Currently no major side effects. Labs reviewed and adequate to continue cycle 2 Xeloda at full dose.  -Start C3 Xeloda on  02/12/21.  -F/u in on 7/20 during off week.      2. G2 peripheral Neuropathy -S/p C2D8 Taxol she developed mild tingling in her hands and feet. -She uses ice bags at home as needed. -She continues to have moderate numbness in her hands and light tingling in her feet. She can take B12 or acupuncture. I encouraged her to remain active.  -Taxol stopped after 12/18/20. Her neuropathy is improving.  -She notes with restart of Metformin and Insulin she has more tingling. Will monitor.    3. Right hip pain, Right iliac bone met, Vit D deficiency -She notes she had recurrent right hip pain since 2020 that radiated down her right hip and buttock pain. -Her right hip pain is caused by the large infiltrative mass around the right iliac wing, Secondary to metastatic disease. -She completed Palliative radiation to right iliac 09/04/20 - 09/22/20 with Dr. Moody, and her pain is much improved.  -Given recent decreased Vit D level, she can continue Vit D 2000-5000 units. I also recommend she take calcium -Given localized bone met, I started her on Zometa q3months on 11/20/20  to strengthen her bones and treatment for her bone mets..   -She does not have much pain now, mostly muscle related right buttock tightness. This pain now radiates to RLQ area. Based on 01/17/21 CT CAP, there is no obvious cause. She still has   oxycodone, although not used much. I encouraged her to continue to walk often and exercise.    4. HTN, DM, COPD, retinitis pigmentosa with limited vision, Chronic combined systolic and diastolic heart failure, EF 45-50% -Continue to follow-up with PCP Dr Henson and cardiologist Dr Harding  -She is legally blind -She notes previously being seen by endocrinologist who had her on DM medication and insulin. Patient stopped medication in early 2021 after BG decreased and stabilized in May 2020. Her 11/20/20 A1c was 6.2. -Hg BG 423 on 01/19/21. I made her PCP aware and she was started on Metformin and Insulin. I  recommend she continue to reduce sweats in diet and eat regularly on insulin. She will check her BG regularly at home.  -BG on 01/29/21 is 288   5. Anemia -She developed anemia during adjuvant chemotherapy, which resolved after she completed chemo -She had recurrent mild anemia with Hgb 11.7 in 01/2019. Labs on 04/02/2019 showed normal iron, ferritin, folate, and MMA -Remains mild and intermittent.    6. Goal of care discussion -The patient understands the goal of care is palliative. -she is full code now    7. Financial and social support -She notes she was getting bills from [place]. I discussed getting more help with our financial advocate. -She has had episode of deep depression since her metastatic cancer diagnosis. I encouraged her to f/u with SW for counseling. She is interested.      Plan -Continue Xeloda C2 which started on 6/13 at full dose 2000mg BID, for a total of 14 days then off for one week. Start C3 on 7/4 at same dose  -Lab and F/u on 7/20   No problem-specific Assessment & Plan notes found for this encounter.   No orders of the defined types were placed in this encounter.  I discussed the assessment and treatment plan with the patient. The patient was provided an opportunity to ask questions and all were answered. The patient agreed with the plan and demonstrated an understanding of the instructions.  The patient was advised to call back or seek an in-person evaluation if the symptoms worsen or if the condition fails to improve as anticipated.  The total time spent in the appointment was 15 minutes.     Yan Feng, MD 01/29/2021   I, Amoya Bennett, am acting as scribe for Yan Feng, MD.   I have reviewed the above documentation for accuracy and completeness, and I agree with the above.       

## 2021-01-26 ENCOUNTER — Ambulatory Visit (INDEPENDENT_AMBULATORY_CARE_PROVIDER_SITE_OTHER): Payer: Medicare Other | Admitting: Family Medicine

## 2021-01-26 ENCOUNTER — Other Ambulatory Visit: Payer: Self-pay

## 2021-01-26 ENCOUNTER — Encounter: Payer: Self-pay | Admitting: Family Medicine

## 2021-01-26 VITALS — BP 130/80 | HR 88

## 2021-01-26 DIAGNOSIS — E1165 Type 2 diabetes mellitus with hyperglycemia: Secondary | ICD-10-CM

## 2021-01-26 DIAGNOSIS — E782 Mixed hyperlipidemia: Secondary | ICD-10-CM

## 2021-01-26 NOTE — Patient Instructions (Signed)
Continue on metformin 500 mg twice daily.  You may continue on Rybelsus 3 mg daily.  I am adding Basaglar 10 units every evening.  Continue keeping a close eye on your blood sugars at home.  Continue cutting back on sugar and carbohydrates.  I put in future orders for fasting lipids and hemoglobin A1c.  Please see that this gets drawn the next time you have your blood drawn through the port.  Follow-up with Dr. Redmond School in 4 weeks.  Bring in your blood sugar readings please

## 2021-01-26 NOTE — Progress Notes (Signed)
   Subjective:    Patient ID: Kristin Ward, female    DOB: 06/10/1956, 65 y.o.   MRN: [id number]  HPI Chief Complaint  Patient presents with   Follow-up    Follow-up on BS. BS is 300's. Its not getting below 300. Was on rybeslus, metformin and was under control. She was not on insulin. Her BS was 333   She is here today to discuss diabetes.  She was seeing an endocrinologist but stopped going several months ago and did not let me know.  Recently her oncologist checked labs and her blood sugar was over 400.   States she stopped her diabetes medications and her diabetes was controlled with diet until March 2022.  States she started going through chemo treatment and her diabetes became uncontrolled again.  She was not on diabetes medication since April 2021 until she started back on metformin 500 mg twice daily per my recommendation one week ago and then she started herself back on Rybelsus on 01/27/2021.    FBS today 333  This is improved since last week.    Hgb A1c 6.2% in April 2022   Denies fever, chills, dizziness, chest pain, palpitations, shortness of breath, abdominal pain, nausea, vomiting or diarrhea.  Denies polydipsia or polyuria.   Review of Systems Pertinent positives and negatives in the history of present illness.     Objective:   Physical Exam BP 130/80   Pulse 88   Alert and oriented in no acute distress.  Respirations unlabored.      Assessment & Plan:  Uncontrolled type 2 diabetes mellitus with hyperglycemia (HCC) - Plan: Hemoglobin A1c, Lipid panel  Mixed hyperlipidemia - Plan: Lipid panel  Here today due to uncontrolled diabetes found by her oncologist. She stopped going to her endocrinologist and does not plan to go back.  She will continue on metformin.  She started Rybelsus on her own since she had these leftover from her endocrinologist.  She will continue on the 3 mg dose of Rybelsus for now.  She will start on Basaglar 10 units every evening.   Congratulated her on cutting back on carbohydrates and sugar and encouraged her to continue this.  She will continue keeping a close eye on her blood sugar. She will be getting labs done next week through her port with the oncologist.  We will check her hemoglobin A1c and she is overdue for fasting lipids so we will check these also. Follow-up pending results. She will also need a diabetes follow-up in the next 4 weeks due to starting back on diabetes medications.

## 2021-01-27 ENCOUNTER — Other Ambulatory Visit: Payer: Self-pay | Admitting: Family Medicine

## 2021-01-27 DIAGNOSIS — E119 Type 2 diabetes mellitus without complications: Secondary | ICD-10-CM

## 2021-01-29 ENCOUNTER — Inpatient Hospital Stay (HOSPITAL_BASED_OUTPATIENT_CLINIC_OR_DEPARTMENT_OTHER): Payer: Medicare Other | Admitting: Hematology

## 2021-01-29 ENCOUNTER — Inpatient Hospital Stay: Payer: Medicare Other

## 2021-01-29 ENCOUNTER — Encounter: Payer: Self-pay | Admitting: Hematology

## 2021-01-29 ENCOUNTER — Other Ambulatory Visit: Payer: Self-pay

## 2021-01-29 DIAGNOSIS — I5042 Chronic combined systolic (congestive) and diastolic (congestive) heart failure: Secondary | ICD-10-CM | POA: Diagnosis not present

## 2021-01-29 DIAGNOSIS — D6481 Anemia due to antineoplastic chemotherapy: Secondary | ICD-10-CM | POA: Diagnosis not present

## 2021-01-29 DIAGNOSIS — H3552 Pigmentary retinal dystrophy: Secondary | ICD-10-CM | POA: Diagnosis not present

## 2021-01-29 DIAGNOSIS — Z171 Estrogen receptor negative status [ER-]: Secondary | ICD-10-CM | POA: Diagnosis not present

## 2021-01-29 DIAGNOSIS — C50512 Malignant neoplasm of lower-outer quadrant of left female breast: Secondary | ICD-10-CM

## 2021-01-29 DIAGNOSIS — I110 Hypertensive heart disease with heart failure: Secondary | ICD-10-CM | POA: Diagnosis not present

## 2021-01-29 DIAGNOSIS — C7951 Secondary malignant neoplasm of bone: Secondary | ICD-10-CM | POA: Diagnosis not present

## 2021-01-29 DIAGNOSIS — E559 Vitamin D deficiency, unspecified: Secondary | ICD-10-CM | POA: Diagnosis not present

## 2021-01-29 DIAGNOSIS — E119 Type 2 diabetes mellitus without complications: Secondary | ICD-10-CM | POA: Diagnosis not present

## 2021-01-29 DIAGNOSIS — Z95828 Presence of other vascular implants and grafts: Secondary | ICD-10-CM

## 2021-01-29 DIAGNOSIS — Z452 Encounter for adjustment and management of vascular access device: Secondary | ICD-10-CM | POA: Diagnosis not present

## 2021-01-29 DIAGNOSIS — J449 Chronic obstructive pulmonary disease, unspecified: Secondary | ICD-10-CM | POA: Diagnosis not present

## 2021-01-29 DIAGNOSIS — E782 Mixed hyperlipidemia: Secondary | ICD-10-CM

## 2021-01-29 DIAGNOSIS — E1165 Type 2 diabetes mellitus with hyperglycemia: Secondary | ICD-10-CM

## 2021-01-29 LAB — CBC WITH DIFFERENTIAL/PLATELET
Abs Immature Granulocytes: 0.02 10*3/uL (ref 0.00–0.07)
Basophils Absolute: 0 10*3/uL (ref 0.0–0.1)
Basophils Relative: 0 %
Eosinophils Absolute: 0.2 10*3/uL (ref 0.0–0.5)
Eosinophils Relative: 2 %
HCT: 35.3 % — ABNORMAL LOW (ref 36.0–46.0)
Hemoglobin: 11.9 g/dL — ABNORMAL LOW (ref 12.0–15.0)
Immature Granulocytes: 0 %
Lymphocytes Relative: 16 %
Lymphs Abs: 1.1 10*3/uL (ref 0.7–4.0)
MCH: 30.2 pg (ref 26.0–34.0)
MCHC: 33.7 g/dL (ref 30.0–36.0)
MCV: 89.6 fL (ref 80.0–100.0)
Monocytes Absolute: 0.4 10*3/uL (ref 0.1–1.0)
Monocytes Relative: 6 %
Neutro Abs: 5.3 10*3/uL (ref 1.7–7.7)
Neutrophils Relative %: 76 %
Platelets: 239 10*3/uL (ref 150–400)
RBC: 3.94 MIL/uL (ref 3.87–5.11)
RDW: 15.9 % — ABNORMAL HIGH (ref 11.5–15.5)
WBC: 7 10*3/uL (ref 4.0–10.5)
nRBC: 0 % (ref 0.0–0.2)

## 2021-01-29 LAB — CMP (CANCER CENTER ONLY)
ALT: 16 U/L (ref 0–44)
AST: 17 U/L (ref 15–41)
Albumin: 4.1 g/dL (ref 3.5–5.0)
Alkaline Phosphatase: 98 U/L (ref 38–126)
Anion gap: 5 (ref 5–15)
BUN: 14 mg/dL (ref 8–23)
CO2: 26 mmol/L (ref 22–32)
Calcium: 9.5 mg/dL (ref 8.9–10.3)
Chloride: 105 mmol/L (ref 98–111)
Creatinine: 0.74 mg/dL (ref 0.44–1.00)
GFR, Estimated: >60 mL/min (ref >60)
Glucose, Bld: 288 mg/dL — ABNORMAL HIGH (ref 70–99)
Potassium: 4.2 mmol/L (ref 3.5–5.1)
Sodium: 136 mmol/L (ref 135–145)
Total Bilirubin: 0.5 mg/dL (ref 0.3–1.2)
Total Protein: 7.8 g/dL (ref 6.5–8.1)

## 2021-01-29 LAB — VITAMIN D 25 HYDROXY (VIT D DEFICIENCY, FRACTURES): Vit D, 25-Hydroxy: 25.99 ng/mL — ABNORMAL LOW (ref 30–100)

## 2021-01-29 MED ORDER — SODIUM CHLORIDE 0.9% FLUSH
10.0000 mL | INTRAVENOUS | Status: DC | PRN
  Filled 2021-01-29: qty 10

## 2021-01-29 MED ORDER — HEPARIN SOD (PORK) LOCK FLUSH 100 UNIT/ML IV SOLN
500.0000 [IU] | Freq: Once | INTRAVENOUS | Status: DC | PRN
  Filled 2021-01-29: qty 5

## 2021-01-29 MED ORDER — FILGRASTIM-SNDZ 480 MCG/0.8ML IJ SOSY: 480.0000 ug | PREFILLED_SYRINGE | Freq: Once | INTRAMUSCULAR | Status: DC

## 2021-01-29 NOTE — Patient Instructions (Signed)

## 2021-01-30 ENCOUNTER — Telehealth: Payer: Self-pay | Admitting: Hematology

## 2021-01-30 LAB — HEMOGLOBIN A1C
Hgb A1c MFr Bld: 10.7 % — ABNORMAL HIGH (ref 4.8–5.6)
Mean Plasma Glucose: 260 mg/dL

## 2021-01-30 LAB — CANCER ANTIGEN 27.29: CA 27.29: 56.2 U/mL — ABNORMAL HIGH (ref 0.0–38.6)

## 2021-01-30 NOTE — Telephone Encounter (Signed)
Left message with follow-up appointment per 6/20 los.

## 2021-02-05 NOTE — Progress Notes (Signed)
I have put in a note to Dr. Burr Medico per pt to get this done

## 2021-02-06 ENCOUNTER — Other Ambulatory Visit (HOSPITAL_COMMUNITY): Payer: Self-pay

## 2021-02-06 ENCOUNTER — Other Ambulatory Visit: Payer: Self-pay | Admitting: Pharmacist

## 2021-02-06 DIAGNOSIS — C50512 Malignant neoplasm of lower-outer quadrant of left female breast: Secondary | ICD-10-CM

## 2021-02-06 MED ORDER — CAPECITABINE 500 MG PO TABS
ORAL_TABLET | ORAL | 1 refills | Status: DC
  Filled 2021-02-06: qty 112, 21d supply, fill #0
  Filled 2021-02-23: qty 112, 21d supply, fill #1

## 2021-02-06 NOTE — Progress Notes (Signed)
Oral Oncology Pharmacist Encounter  Prescription refill for Xeloda (capecitabine) previously sent to [facility] with quantity of #114. Quantity has been updated on script from #114 to #112 to reflect patient's current dosing of "4 tablets in morning and 4 tablets in evening for 14 days then off 7 days. Repeat every 21 days. Take within 30 minutes after meals". Rx redirected back to [facility].   Leron Croak, PharmD, BCPS Hematology/Oncology Clinical Pharmacist [facility] [phone] 02/06/2021 12:54 PM

## 2021-02-09 ENCOUNTER — Other Ambulatory Visit: Payer: Medicare Other

## 2021-02-09 ENCOUNTER — Ambulatory Visit: Payer: Medicare Other | Admitting: Hematology

## 2021-02-10 ENCOUNTER — Other Ambulatory Visit (HOSPITAL_COMMUNITY): Payer: Self-pay

## 2021-02-12 ENCOUNTER — Other Ambulatory Visit (HOSPITAL_COMMUNITY): Payer: Self-pay

## 2021-02-13 ENCOUNTER — Other Ambulatory Visit (HOSPITAL_COMMUNITY): Payer: Self-pay

## 2021-02-14 ENCOUNTER — Telehealth: Payer: Self-pay | Admitting: Family Medicine

## 2021-02-14 NOTE — Telephone Encounter (Signed)
Please call, Pt called re Basaglar She states she is having side effects She states it causes her body to "shut down" She says after she takes it she is nauseous and weak. She has not taken any since Saturday or Sunday She is scheduled for 4 week follow up with you on 7/19

## 2021-02-15 ENCOUNTER — Telehealth: Payer: Self-pay | Admitting: Internal Medicine

## 2021-02-15 NOTE — Telephone Encounter (Signed)
Pt was advised [facility] 

## 2021-02-15 NOTE — Telephone Encounter (Signed)
Pt had an appt with Dr. Katy Fitch on June 17th but no showed appt

## 2021-02-19 ENCOUNTER — Ambulatory Visit: Payer: Medicare Other | Attending: Hematology | Admitting: Physical Therapy

## 2021-02-23 ENCOUNTER — Other Ambulatory Visit (HOSPITAL_COMMUNITY): Payer: Self-pay

## 2021-02-23 DIAGNOSIS — G620 Drug-induced polyneuropathy: Secondary | ICD-10-CM | POA: Diagnosis not present

## 2021-02-23 DIAGNOSIS — L84 Corns and callosities: Secondary | ICD-10-CM | POA: Diagnosis not present

## 2021-02-23 DIAGNOSIS — L603 Nail dystrophy: Secondary | ICD-10-CM | POA: Diagnosis not present

## 2021-02-23 DIAGNOSIS — B351 Tinea unguium: Secondary | ICD-10-CM | POA: Diagnosis not present

## 2021-02-26 ENCOUNTER — Encounter: Payer: Self-pay | Admitting: Cardiology

## 2021-02-26 ENCOUNTER — Ambulatory Visit (INDEPENDENT_AMBULATORY_CARE_PROVIDER_SITE_OTHER): Payer: Medicare Other | Admitting: Cardiology

## 2021-02-26 ENCOUNTER — Other Ambulatory Visit: Payer: Self-pay

## 2021-02-26 VITALS — BP 110/78 | HR 81 | Resp 18 | Ht 67.0 in | Wt 215.2 lb

## 2021-02-26 DIAGNOSIS — I2510 Atherosclerotic heart disease of native coronary artery without angina pectoris: Secondary | ICD-10-CM | POA: Diagnosis not present

## 2021-02-26 DIAGNOSIS — E669 Obesity, unspecified: Secondary | ICD-10-CM | POA: Diagnosis not present

## 2021-02-26 DIAGNOSIS — I420 Dilated cardiomyopathy: Secondary | ICD-10-CM | POA: Diagnosis not present

## 2021-02-26 DIAGNOSIS — I5042 Chronic combined systolic (congestive) and diastolic (congestive) heart failure: Secondary | ICD-10-CM | POA: Diagnosis not present

## 2021-02-26 DIAGNOSIS — E782 Mixed hyperlipidemia: Secondary | ICD-10-CM | POA: Diagnosis not present

## 2021-02-26 DIAGNOSIS — I10 Essential (primary) hypertension: Secondary | ICD-10-CM

## 2021-02-26 NOTE — Patient Instructions (Addendum)
.  Medication Instructions:  No changes   *If you need a refill on your cardiac medications before your next appointment, please call your pharmacy*   Lab Work: Lipid - fasting  If you have labs (blood work) drawn today and your tests are completely normal, you will receive your results only by: [place] (if you have MyChart) OR A paper copy in the mail If you have any lab test that is abnormal or we need to change your treatment, we will call you to review the results.   Testing/Procedures: Not needed   Follow-Up: At [facility], you and your health needs are our priority.  As part of our continuing mission to provide you with exceptional heart care, we have created designated Provider Care Teams.  These Care Teams include your primary Cardiologist (physician) and Advanced Practice Providers (APPs -  Physician Assistants and Nurse Practitioners) who all work together to provide you with the care you need, when you need it.     Your next appointment:   12 month(s)  The format for your next appointment:   In Person  Provider:   Glenetta Hew, MD

## 2021-02-26 NOTE — Progress Notes (Signed)
Primary Care Provider: Girtha Rm, NP-C Cardiologist: Glenetta Hew, MD Electrophysiologist: None  Clinic Note: Chief Complaint  Patient presents with   Follow-up    Stable from a cardiac standpoint, but is undergoing treatment for metastatic breast cancer in the right hip   Cardiomyopathy    No active heart failure symptoms.    ===================================  ASSESSMENT/PLAN   Problem List Items Addressed This Visit     Chronic combined systolic and diastolic heart failure (HCC) (Chronic)    Class I-II symptoms now on stable regimen.  No change to current meds.   Continue current dose of carvedilol 12.5 mg twice daily, Entresto 97/103 mg twice daily, spironolactone 20 mg daily along with 20 mg Lasix daily.         Relevant Orders   EKG 12-Lead (Completed)   Essential hypertension (Chronic)    Blood pressure looks pretty good today. Has not required hydralazine.  Continue current meds       Dilated cardiomyopathy (HCC) (Chronic)    NYHA class I-II CHF symptoms on stable regimen of carvedilol, Entresto, spironolactone and low-dose Lasix.  Barely requiring any PRN Lasix dosing.  Blood pressures are definitely more stable.         Coronary artery calcification seen on CAT scan - Primary (Chronic)    Nonocclusive disease on ischemic evaluation.  EF stable.  Target LDL should be less than 70, I do not see her on statin.  Need to be addressed.  She is on beta-blocker and ARNI. On aspirin.       Relevant Orders   EKG 12-Lead (Completed)   Mixed hyperlipidemia (Chronic)    Due for follow-up lipids to be checked.  We can add to labs for upcoming lab draw (within next access report) she is no longer on statin..       Obesity (BMI 30-39.9) (Chronic)    Notable weight loss.  Doing well.  Encouraged her efforts.  I think a lot of this has to do with her ongoing cancer treatment.  On the right side, weight loss is helpful for her generalized health.        ===================================  HPI:    Kristin Ward is a 65 y.o. 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  2017 -> while living in [place], diagnosed with NONISCHEMIC CARDIOMYOPATHY-EF 18%.  (No report of cardiac catheterization); treated medically. =>  Follow-up echocardiogram in 2019 showed EF 45 to 50%. 2018: L Breast cancer treated with lumpectomy and chemotherapy; had her port removed in July 2020. January 2022-underwent biopsy of mass seen as part of evaluation for intractable right hip pain. --> large infiltrative mass around the right iliac wing --> biopsy-proven metastatic carcinoma (breast primary) Radiation oncology referral for XRT along with suppression chemotherapy (Xeloda)  Sonny Masters Monterroso was last seen on July 10, 2020 -> this was a 1 month follow-up after being seen twice by Kerin Ransom, PA.  She apparently had stopped medications because of concern about unusual stool.  Also noted if she not sleeping well.  Leg mostly right leg was hurting her.  Thankfully, no PND, orthopnea or edema to suggest heart failure symptoms.  BP better controlled with Entresto.  She had lost weight. Recommended converting hydralazine to PRN for sustained SBP> 150 mmHg. We started spironolactone 20 mg daily. Lasix PRN.  4 pound weight gain.  Recent Hospitalizations: No hospitalizations, but has been undergoing radiation treatment.  Reviewed  CV studies:    The following studies were reviewed today: (if available, images/films reviewed: From Epic Chart or Care Everywhere) No new study:   Interval History:   Kristin Ward returns here today mostly discussing her recent diagnosis of metastatic breast cancer.  The right leg pain that she was having was related to this mass.  Not thought to be any surgical options,  so she is therefore undergoing palliative XRT and chemotherapy with Xeloda.  Thankfully, she is not really having any heart failure symptoms.  She is actually lost even more weight-was 240 pounds back in early 2021 and is now down to 215 pounds.  Partly because of her cancer diagnosis but also intentional.  Unfortunately the hip pain is really significant and she not really been on the exercise walking, but is trying to do other types of exercises to try to lose weight.  Also adjusting her diet.  She not having any chest pain or pressure with rest or exertion.  Minimal edema.  She maybe takes additional PRN Lasix once or twice every 2 weeks. Probably because of less p.o. intake, her blood pressures have been better controlled.  She denies any PND and orthopnea.  No real sense of palpitations.   CV Review of Symptoms (Summary) Cardiovascular ROS: positive for - DOE if she overdoes it with exertion.-Deconditioned, well-controlled edema negative for - chest pain, irregular heartbeat, orthopnea, palpitations, paroxysmal nocturnal dyspnea, rapid heart rate, shortness of breath, or lightheadedness, dizziness or wooziness, syncope/near syncope or TIA/amaurosis fugax, claudication  REVIEWED OF SYSTEMS   Review of Systems  Constitutional:  Positive for malaise/fatigue (She does get tired pretty easily with her cancer treatments, but on the off days is doing okay.) and weight loss (An additional 11 pounds since last visit, 25 pounds over the last year and a half).  HENT:  Negative for congestion and nosebleeds.   Respiratory:  Negative for cough and shortness of breath (Only if she overdoes it).   Gastrointestinal:  Positive for constipation (Related to medications.). Negative for blood in stool and melena.  Genitourinary:  Negative for hematuria.  Musculoskeletal:  Positive for joint pain (Right hip pain is gradually improving.  Requiring "heavy meds ").  Neurological:  Positive for focal weakness (Right  leg is a bit weak). Negative for dizziness (Sometimes when she stands up too quickly, especially if she has not been eating and drinking well.), tingling and headaches.  Psychiatric/Behavioral:  Negative for depression (Seems to be handling the diagnosis of recurrent metastatic cancer relatively well.) and memory loss. The patient is not nervous/anxious and does not have insomnia.    I have reviewed and (if needed) personally updated the patient's problem list, medications, allergies, past medical and surgical history, social and family history.   PAST MEDICAL HISTORY   Past Medical History:  Diagnosis Date   Abnormal x-ray of lungs with single pulmonary nodule 03/15/2016   per records from [place]    Anemia    Asthma    Cholelithiasis 05/19/2017   On CT   Coronary artery calcification seen on CAT scan 05/19/2017   Diabetes mellitus without complication ([place])    type 2   Dilated idiopathic cardiomyopathy ([place]) 12/2015   EF 15-20%. Diagnosed in [facility]   Headache    NONE RECENT   Hypertension    Lymph nodes enlarged 07/25/2020   Malignant neoplasm of lower-outer quadrant of left breast of female,  estrogen receptor negative ([place]) 06/11/2017   left breast   Mixed hyperlipidemia 06/17/2018   partially blind    Personal history of chemotherapy 2018-2019   Personal history of radiation therapy 2019   Retinitis pigmentosa    Retroperitoneal lymphadenopathy 07/25/2020   Uveitis     PAST SURGICAL HISTORY   Past Surgical History:  Procedure Laterality Date   brain cyst removed  2007   to help with headaches per patient , aspirated    BREAST BIOPSY Left 06/06/2017   x2   BREAST CYST ASPIRATION Left 06/06/2017   BREAST LUMPECTOMY Left 06/26/2017   BREAST LUMPECTOMY WITH RADIOACTIVE SEED AND SENTINEL LYMPH NODE BIOPSY Left 06/26/2017   Procedure: BREAST LUMPECTOMY WITH RADIOACTIVE SEED AND SENTINEL LYMPH NODE BIOPSY ERAS  PATHWAY;  Surgeon: [name], MD;   Location: [place];  Service: General;  Laterality: Left;  pec block   FINE NEEDLE ASPIRATION Left 02/23/2019   Procedure: FINE NEEDLE ASPIRATION;  Surgeon: [name], MD;  Location: [place];  Service: General;  Laterality: Left;  Asiration of left breast seroma    IR IMAGING GUIDED PORT INSERTION  10/13/2020   NASAL TURBINATE REDUCTION     PORT-A-CATH REMOVAL Right 02/23/2019   Procedure: REMOVAL PORT-A-CATH;  Surgeon: [name], MD;  Location: [place];  Service: General;  Laterality: Right;   PORTACATH PLACEMENT N/A 06/26/2017   Procedure: INSERTION PORT-A-CATH;  Surgeon: [name], MD;  Location: [place];  Service: General;  Laterality: N/A;   TRANSTHORACIC ECHOCARDIOGRAM  12/2015   A) [facility] May 2017: Mild concentric LVH. Global hypokinesis. GR daily. EF 18% severe LA dilation. Mitral annular dilatation with papillary muscle dysfunction and moderate MR. Dilated IVC consistent with elevated RAP.   TRANSTHORACIC ECHOCARDIOGRAM  05/2017; 08/2017   a) Mild concentric LVH.  EF 45 to 50% with diffuse hypokinesis.  GR 1 DD.  Mild aortic root dilation.;; b)  Mild LVH EF 45 to 50%.  Diffuse HK.  No significant valve disease.  No significant change.     There is no immunization history on file for this patient.  MEDICATIONS/ALLERGIES   Current Meds  Medication Sig   Accu-Chek FastClix Lancets MISC TEST TWICE A DAY. PT USES AN ACCU-CHEK GUIDE ME METER   aspirin 81 MG chewable tablet Chew 81 mg by mouth daily.   carvedilol (COREG) 12.5 MG tablet Take 1 tablet (12.5 mg total) by mouth 2 (two) times daily with a meal.   Cholecalciferol (D3-1000 PO) Take by mouth.   diclofenac (VOLTAREN) 75 MG EC tablet Take 1 tablet (75 mg total) by mouth 2 (two) times daily.   furosemide (LASIX) 20 MG tablet Take 20 mg by mouth daily. Patient is unsure of dose   glucose blood (ACCU-CHEK GUIDE) test strip Use as instructed to test blood sugar 2 times a day    ibuprofen (ADVIL) 800 MG tablet Take 1 tablet (800 mg total) by mouth every 8 (eight) hours as needed.   lidocaine-prilocaine (EMLA) cream Apply 1 application topically as needed.   linaclotide (LINZESS) 72 MCG capsule Take 1 capsule (72 mcg total) by mouth daily before breakfast.   metFORMIN (GLUCOPHAGE) 500 MG tablet Take 500 mg by mouth 2 (two) times daily with a meal.   Multiple Vitamins-Minerals (WOMENS MULTI PO) Take by mouth.   prochlorperazine (COMPAZINE) 10 MG tablet Take 1 tablet (10 mg total) by mouth every 6 (six) hours as needed (Nausea or vomiting).   Saccharomyces boulardii (PROBIOTIC) 250  MG CAPS Take by mouth.   sacubitril-valsartan (ENTRESTO) 97-103 MG Take 1 tablet by mouth 2 (two) times daily.   Semaglutide (RYBELSUS) 3 MG TABS Take by mouth.   vitamin B-12 (CYANOCOBALAMIN) 500 MCG tablet Take 500 mcg by mouth daily.   vitamin C (ASCORBIC ACID) 500 MG tablet Take 1,000 mg 2 (two) times daily by mouth.   [DISCONTINUED] capecitabine (XELODA) 500 MG tablet Take 4 tabs in morning and 4 tabs in evening, every 12hrs, for 14 days then off for 7 days. Repeat every 21 days. Take within 30 minutes after meals.   [DISCONTINUED] Insulin Glargine (BASAGLAR KWIKPEN) 100 UNIT/ML Inject 5 Units into the skin at bedtime.    Allergies  Allergen Reactions   Morphine And Related Nausea And Vomiting   Latex Hives and Itching    Burning    Tylenol With Codeine #3 [Acetaminophen-Codeine] Nausea And Vomiting   Penicillins Nausea Only, Swelling and Rash    Has patient had a PCN reaction causing immediate rash, facial/tongue/throat swelling, SOB or lightheadedness with hypotension: Yes Has patient had a PCN reaction causing severe rash involving mucus membranes or skin necrosis: Yes Has patient had a PCN reaction that required hospitalization: No Has patient had a PCN reaction occurring within the last 10 years: No TONGUE SWELLING AND RASH AROUND MOUTH If all of the above answers are "NO", then  may proceed with Cephalosporin use.     SOCIAL HISTORY/FAMILY HISTORY   Reviewed in Epic:  Pertinent findings:  Social History   Tobacco Use   Smoking status: Former    Packs/day: 0.25    Years: 23.00    Pack years: 5.75    Types: Cigarettes    Quit date: 10/09/2015    Years since quitting: 5.4   Smokeless tobacco: Never  Vaping Use   Vaping Use: Never used  Substance Use Topics   Alcohol use: No   Drug use: No   Social History   Social History Narrative   She is a widowed mother of 2 (daughter and son).   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   She moved to [place] apparently back in 2016, but was visiting family in [place] and was diagnosed with Cardiomyopathy-EF 18%.   She is usually accompanied by her daughter.    At baseline, she walks roughly 1-2 miles a day.    OBJCTIVE -PE, EKG, labs   Wt Readings from Last 3 Encounters:  02/27/21 214 lb 9.6 oz (97.3 kg)  02/26/21 215 lb 3.2 oz (97.6 kg)  01/19/21 225 lb 6.4 oz (102.2 kg)    Physical Exam: BP 110/78   Pulse 81   Resp 18   Ht 5\' 7"  (1.702 m)   Wt 215 lb 3.2 oz (97.6 kg)   SpO2 92%   BMI 33.71 kg/m  Physical Exam Vitals reviewed.  Constitutional:      General: She is not in acute distress.    Appearance: She is obese. She is not ill-appearing or toxic-appearing.     Comments: Now mild to moderately obese.  Well-groomed.  HENT:     Head: Normocephalic and atraumatic.  Eyes:     Comments: Left eye -> abduction and abduction diminished  Neck:     Vascular: No carotid bruit or JVD.  Cardiovascular:     Rate and Rhythm: Normal rate and regular rhythm. No extrasystoles are present.    Chest Wall: PMI is not displaced.     Pulses: Normal pulses.  Heart sounds: S1 normal and S2 normal. Heart sounds are distant. Murmur heard.  High-pitched blowing holosystolic murmur is present with a grade of 1/6 at the apex.    No friction rub. No gallop.  Pulmonary:     Effort: Pulmonary effort is  normal. No respiratory distress.     Breath sounds: Normal breath sounds. No wheezing, rhonchi or rales.  Musculoskeletal:        General: Swelling (Trivial) present. Normal range of motion.     Cervical back: Normal range of motion and neck supple.  Neurological:     Mental Status: She is alert and oriented to person, place, and time. Mental status is at baseline.     Cranial Nerves: Cranial nerve deficit (left extraocular muscles-movement diminished, chronic) present.  Psychiatric:        Behavior: Behavior normal.        Thought Content: Thought content normal.        Judgment: Judgment normal.     Comments: Seems a bit subdued, but not really depressed     Adult ECG Report  Rate: 81 ;  Rhythm: normal sinus rhythm and LV H by voltage with nonspecific ST and T wave changes consistent with repolarization abnormality. ;   Narrative Interpretation: Slightly more prominent repolarization abnormalities  Recent Labs:   Reviewed.  She will be due for lipids to be checked by PCP soon.  (We will add to next port check. Lab Results  Component Value Date   CHOL 114 01/06/2020   HDL 32 (L) 01/06/2020   LDLCALC 66 01/06/2020   TRIG 81 01/06/2020   CHOLHDL 3.6 01/06/2020   Lab Results  Component Value Date   CREATININE 0.97 02/27/2021   BUN 13 02/27/2021   NA 141 02/27/2021   K 3.8 02/27/2021   CL 106 02/27/2021   CO2 25 02/27/2021   CBC Latest Ref Rng & Units 02/27/2021 01/29/2021 01/19/2021  WBC 4.0 - 10.5 K/uL 5.7 7.0 5.0  Hemoglobin 12.0 - 15.0 g/dL 12.5 11.9(L) 11.5(L)  Hematocrit 36.0 - 46.0 % 36.8 35.3(L) 34.1(L)  Platelets 150 - 400 K/uL 334 239 256   Lab Results  Component Value Date   HGBA1C 10.7 (H) 01/29/2021    Lab Results  Component Value Date   TSH 3.71 06/23/2020    ==================================================  COVID-19 Education: The signs and symptoms of COVID-19 were discussed with the patient and how to seek care for testing (follow up with PCP or  arrange E-visit).    I spent a total of 22 minutes with the patient spent in direct patient consultation.  Additional time spent with chart review  / charting (studies, outside notes, etc): 16 min Total Time: 38 min  Current medicines are reviewed at length with the patient today.  (+/- concerns) none  This visit occurred during the SARS-CoV-2 public health emergency.  Safety protocols were in place, including screening questions prior to the visit, additional usage of staff PPE, and extensive cleaning of exam room while observing appropriate contact time as indicated for disinfecting solutions.  Notice: This dictation was prepared with Dragon dictation along with smaller phrase technology. Any transcriptional errors that result from this process are unintentional and may not be corrected upon review.  Patient Instructions / Medication Changes & Studies & Tests Ordered   Patient Instructions  .Medication Instructions:  No changes   *If you need a refill on your cardiac medications before your next appointment, please call your pharmacy*   Lab Work: Lipid -  fasting  If you have labs (blood work) drawn today and your tests are completely normal, you will receive your results only by: [place] (if you have MyChart) OR A paper copy in the mail If you have any lab test that is abnormal or we need to change your treatment, we will call you to review the results.   Testing/Procedures: Not needed   Follow-Up: At [facility], you and your health needs are our priority.  As part of our continuing mission to provide you with exceptional heart care, we have created designated Provider Care Teams.  These Care Teams include your primary Cardiologist (physician) and Advanced Practice Providers (APPs -  Physician Assistants and Nurse Practitioners) who all work together to provide you with the care you need, when you need it.     Your next appointment:   12 month(s)  The format  for your next appointment:   In Person  Provider:   Glenetta Hew, MD     Studies Ordered:   Orders Placed This Encounter  Procedures   EKG 12-Lead     Glenetta Hew, M.D., M.S. Interventional Cardiologist   Pager # [phone] Phone # [phone] [address] [place], [place] [address]   Thank you for choosing Heartcare at [facility]!!

## 2021-02-27 ENCOUNTER — Inpatient Hospital Stay: Payer: Medicare Other

## 2021-02-27 ENCOUNTER — Encounter: Payer: Self-pay | Admitting: Hematology

## 2021-02-27 ENCOUNTER — Other Ambulatory Visit (HOSPITAL_COMMUNITY): Payer: Self-pay

## 2021-02-27 ENCOUNTER — Inpatient Hospital Stay: Payer: Medicare Other | Attending: Hematology

## 2021-02-27 ENCOUNTER — Ambulatory Visit: Payer: Medicare Other | Admitting: Family Medicine

## 2021-02-27 ENCOUNTER — Inpatient Hospital Stay (HOSPITAL_BASED_OUTPATIENT_CLINIC_OR_DEPARTMENT_OTHER): Payer: Medicare Other | Admitting: Hematology

## 2021-02-27 ENCOUNTER — Other Ambulatory Visit: Payer: Self-pay

## 2021-02-27 VITALS — BP 152/108 | HR 80 | Temp 97.5°F | Resp 20 | Ht 67.0 in | Wt 214.6 lb

## 2021-02-27 VITALS — BP 145/94 | HR 91 | Temp 98.9°F | Resp 18

## 2021-02-27 DIAGNOSIS — E1165 Type 2 diabetes mellitus with hyperglycemia: Secondary | ICD-10-CM | POA: Insufficient documentation

## 2021-02-27 DIAGNOSIS — C7951 Secondary malignant neoplasm of bone: Secondary | ICD-10-CM | POA: Diagnosis not present

## 2021-02-27 DIAGNOSIS — R319 Hematuria, unspecified: Secondary | ICD-10-CM

## 2021-02-27 DIAGNOSIS — Z171 Estrogen receptor negative status [ER-]: Secondary | ICD-10-CM

## 2021-02-27 DIAGNOSIS — Z170 Estrogen receptor positive status [ER+]: Secondary | ICD-10-CM | POA: Diagnosis not present

## 2021-02-27 DIAGNOSIS — C50512 Malignant neoplasm of lower-outer quadrant of left female breast: Secondary | ICD-10-CM

## 2021-02-27 DIAGNOSIS — N390 Urinary tract infection, site not specified: Secondary | ICD-10-CM | POA: Insufficient documentation

## 2021-02-27 DIAGNOSIS — Z95828 Presence of other vascular implants and grafts: Secondary | ICD-10-CM

## 2021-02-27 DIAGNOSIS — Z79899 Other long term (current) drug therapy: Secondary | ICD-10-CM | POA: Insufficient documentation

## 2021-02-27 LAB — CBC WITH DIFFERENTIAL/PLATELET
Abs Immature Granulocytes: 0.01 10*3/uL (ref 0.00–0.07)
Basophils Absolute: 0 10*3/uL (ref 0.0–0.1)
Basophils Relative: 0 %
Eosinophils Absolute: 0.1 10*3/uL (ref 0.0–0.5)
Eosinophils Relative: 3 %
HCT: 36.8 % (ref 36.0–46.0)
Hemoglobin: 12.5 g/dL (ref 12.0–15.0)
Immature Granulocytes: 0 %
Lymphocytes Relative: 22 %
Lymphs Abs: 1.2 10*3/uL (ref 0.7–4.0)
MCH: 30.5 pg (ref 26.0–34.0)
MCHC: 34 g/dL (ref 30.0–36.0)
MCV: 89.8 fL (ref 80.0–100.0)
Monocytes Absolute: 0.5 10*3/uL (ref 0.1–1.0)
Monocytes Relative: 9 %
Neutro Abs: 3.8 10*3/uL (ref 1.7–7.7)
Neutrophils Relative %: 66 %
Platelets: 334 10*3/uL (ref 150–400)
RBC: 4.1 MIL/uL (ref 3.87–5.11)
RDW: 15.8 % — ABNORMAL HIGH (ref 11.5–15.5)
WBC: 5.7 10*3/uL (ref 4.0–10.5)
nRBC: 0 % (ref 0.0–0.2)

## 2021-02-27 LAB — COMPREHENSIVE METABOLIC PANEL
ALT: 8 U/L (ref 0–44)
AST: 14 U/L — ABNORMAL LOW (ref 15–41)
Albumin: 4 g/dL (ref 3.5–5.0)
Alkaline Phosphatase: 112 U/L (ref 38–126)
Anion gap: 10 (ref 5–15)
BUN: 13 mg/dL (ref 8–23)
CO2: 25 mmol/L (ref 22–32)
Calcium: 10.5 mg/dL — ABNORMAL HIGH (ref 8.9–10.3)
Chloride: 106 mmol/L (ref 98–111)
Creatinine, Ser: 0.97 mg/dL (ref 0.44–1.00)
GFR, Estimated: >60 mL/min (ref >60)
Glucose, Bld: 237 mg/dL — ABNORMAL HIGH (ref 70–99)
Potassium: 3.8 mmol/L (ref 3.5–5.1)
Sodium: 141 mmol/L (ref 135–145)
Total Bilirubin: 0.8 mg/dL (ref 0.3–1.2)
Total Protein: 8.3 g/dL — ABNORMAL HIGH (ref 6.5–8.1)

## 2021-02-27 LAB — URINALYSIS, COMPLETE (UACMP) WITH MICROSCOPIC
Bilirubin Urine: NEGATIVE
Glucose, UA: 50 mg/dL — AB
Ketones, ur: NEGATIVE mg/dL
Leukocytes,Ua: NEGATIVE
Nitrite: NEGATIVE
Protein, ur: 100 mg/dL — AB
RBC / HPF: >50 RBC/hpf — ABNORMAL HIGH (ref 0–5)
Specific Gravity, Urine: 1.032 — ABNORMAL HIGH (ref 1.005–1.030)
pH: 5 (ref 5.0–8.0)

## 2021-02-27 MED ORDER — SODIUM CHLORIDE 0.9 % IV SOLN
INTRAVENOUS | Status: AC
  Filled 2021-02-27 (×2): qty 250

## 2021-02-27 MED ORDER — HEPARIN SOD (PORK) LOCK FLUSH 100 UNIT/ML IV SOLN
500.0000 [IU] | Freq: Once | INTRAVENOUS | Status: AC | PRN
  Administered 2021-02-27: 500 [IU] via INTRAVENOUS
  Filled 2021-02-27: qty 5

## 2021-02-27 MED ORDER — SODIUM CHLORIDE 0.9% FLUSH
10.0000 mL | INTRAVENOUS | Status: DC | PRN
  Administered 2021-02-27 (×2): 10 mL via INTRAVENOUS
  Filled 2021-02-27: qty 10

## 2021-02-27 MED ORDER — SODIUM CHLORIDE 0.9% FLUSH
10.0000 mL | INTRAVENOUS | Status: DC | PRN
  Administered 2021-02-27: 10 mL via INTRAVENOUS
  Filled 2021-02-27: qty 10

## 2021-02-27 MED ORDER — HEPARIN SOD (PORK) LOCK FLUSH 100 UNIT/ML IV SOLN
500.0000 [IU] | Freq: Once | INTRAVENOUS | Status: DC | PRN
  Filled 2021-02-27: qty 5

## 2021-02-27 MED ORDER — CIPROFLOXACIN HCL 500 MG PO TABS: 500.0000 mg | ORAL_TABLET | Freq: Two times a day (BID) | ORAL | 0 refills | Status: DC

## 2021-02-27 NOTE — Progress Notes (Signed)
[facility]   [phone] [phone]   Clinic Follow up Note   Patient Care Team: Girtha Rm, NP-C as PCP - General (Family Medicine) [name], MD as PCP - Cardiology (Cardiology) [name], MD as Consulting Physician (Hematology) [name], MD as Consulting Physician (General Surgery)  Date of Service:  02/27/2021  CHIEF COMPLAINT: F/u of metastatic left breast cancer   SUMMARY OF ONCOLOGIC HISTORY: Oncology History Overview Note  Cancer Staging Malignant neoplasm of lower-outer quadrant of left breast of female, estrogen receptor negative ([place]) Staging form: Breast, AJCC 8th Edition - Clinical stage from 06/06/2017: Stage IB (cT1c, cN0, cM0, G3, ER: Negative, PR: Negative, HER2: Negative) - Signed by [name], MD on 06/15/2017 - Pathologic stage from 06/26/2017: Stage IB (pT1c, pN0, cM0, G3, ER: Negative, PR: Negative, HER2: Negative) - Signed by [name], MD on 07/10/2017     Malignant neoplasm of lower-outer quadrant of left breast of female, estrogen receptor negative ([place])  05/30/2017 Mammogram   Diagnostic mammo and US IMPRESSION: 1. Highly suspicious 1.4 cm mass in the slightly lower slightly outer left breast -tissue sampling recommended. 2. Indeterminate 0.5 mm mass in the slightly lower slightly outer left breast -tissue sampling recommended. 3. At least 2 left axillary lymph nodes with borderline cortical thickness.    06/06/2017 Initial Biopsy   Diagnosis 1. Breast, left, needle core biopsy, 5:30 o'clock - INVASIVE DUCTAL CARCINOMA, G3 2. Lymph node, needle/core biopsy, left axillary - NO CARCINOMA IDENTIFIED IN ONE LYMPH NODE (0/1)    06/06/2017 Initial Diagnosis   Malignant neoplasm of lower-outer quadrant of left breast of female, estrogen receptor negative ([place])    06/06/2017 Receptors her2   Estrogen Receptor: 0%, NEGATIVE Progesterone Receptor: 0%, NEGATIVE Proliferation Marker Ki67: 70%     06/26/2017 Surgery   LEFT BREAST LUMPECTOMY WITH RADIOACTIVE SEED AND SENTINEL LYMPH NODE BIOPSY ERAS  PATHWAY AND INSERTION PORT-A-CATH By Dr. Barry Dienes on 06/26/17     06/26/2017 Pathology Results   Diagnosis 06/26/17  1. Breast, lumpectomy, Left - INVASIVE DUCTAL CARCINOMA, GRADE III/III, SPANNING 1.2 CM. - THE SURGICAL RESECTION MARGINS ARE NEGATIVE FOR CARCINOMA. - SEE ONCOLOGY TABLE BELOW. 2. Lymph node, sentinel, biopsy, Left axillary #1 - THERE IS NO EVIDENCE OF CARCINOMA IN 1 OF 1 LYMPH NODE (0/1). 3. Lymph node, sentinel, biopsy, Left axillary #2 - THERE IS NO EVIDENCE OF CARCINOMA IN 1 OF 1 LYMPH NODE (0/1). 4. Lymph node, sentinel, biopsy, Left axillary #3 - THERE IS NO EVIDENCE OF CARCINOMA IN 1 OF 1 LYMPH NODE (0/1).     07/25/2017 - 09/29/2017 Chemotherapy   Adjuvant cytoxan and docetaxel (TC) every 3 weekd for 4 cycles      11/19/2017 - 12/17/2017 Radiation Therapy   Adjuvant breast radiation Left breast treated to 42.5 Gy with 17 fx of 2.5 Gy followed by a boost of 7.5 Gy with 3 fx of 2.5 Gy      02/2019 Procedure   She had PAC removal in 02/2019.    07/23/2020 Imaging   MRI Lumbar Spine 07/23/20 IMPRESSION: 1. No significant disc herniation, spinal canal or neural foraminal stenosis at any level. 2. Moderate facet degenerative changes at L3-4, L4-5 and L5-S1. 3. Multiple enlarged retroperitoneal and right iliac lymph nodes. Recommend correlation with CT of the abdomen and pelvis with contrast.   08/15/2020 Imaging   CT AP 08/15/20  IMPRESSION: 1. Right iliac and periaortic adenopathy is noted concerning for metastatic disease or lymphoma. Also noted  is abnormal soft tissue mass anterior and posterior to the right iliac wing concerning for malignancy or metastatic disease. MRI is recommended for further evaluation. 2. Irregular lucency with sclerotic margins is seen involving the right iliac crest concerning for possible lytic lesion. MRI may be performed  for further evaluation. 3. Enlarged fibroid uterus. 4. Small gallstone. 5. Aortic atherosclerosis.   08/22/2020 Pathology Results   FINAL MICROSCOPIC DIAGNOSIS: 08/22/20  A. NEEDLE CORE, RIGHT GLUTEUS MINIMUMUS, BIOPSY:  -  Metastatic carcinoma  -  See comment   COMMENT:   By immunohistochemistry, the neoplastic cells are positive for  cytokeratin-7 and have weak patchy positivity for GATA3 but are negative  for cytokeratin-20 and GCDFP.  The combined morphology and  immunophenotype, support metastasis from the patient's known breast  primary carcinoma.  Dr. Burr Medico was notified of these results on 08/24/2020.  Prognostic markers (ER, PR, Her2, PDL-1) are pending and will be  reported in an addendum   ADDENDUM:   PROGNOSTIC INDICATOR RESULTS:   Immunohistochemical and morphometric analysis performed manually   The tumor cells are NEGATIVE for Her2 (0).   Estrogen Receptor:       POSITIVE, 20%, WEAK STAINING  Progesterone Receptor:   NEGATIVE    PD-L1 (0) negative    09/04/2020 - 09/22/2020 Radiation Therapy   Palliative radiation to right iliac 09/04/20 - 09/22/20, Dr. Lisbeth Renshaw   09/26/2020 -  Chemotherapy   first line weekly Taxol 3 weeks on/1 week off starting 09/26/20 --Reduced to 2 weeks on/1 week off starting with C3 due to neuropathy. Stopped after C4 on 12/18/20 due to neuratrophy.  --Switched to Xeloda on 01/01/21 at 1569m in the AM and 20067min the PM for 2 weeks on/1 week off     10/13/2020 Procedure    PAC placement on 10/13/20.       CURRENT THERAPY:  -Due to Neuropathy from first line Taxol, I switched to Xeloda on 01/01/21 at 150035mn the AM and 2000m37m the PM for 2 weeks on/1 week off. Increased to full dose 2000mg67m with C2 on 01/22/21.  -Zometa q3mont46monthting 11/20/20   INTERVAL HISTORY:  PatricKayly Kriegels is here for a scheduled routine follow up. She reports newly onset hematuria this morning, with urinary frequency but no dysuria or burning sensation.  No fever or chills. She also feels more figued than usual. She reports intermittent left frank pain since this morning.  She completed last cycle Xeloda 7/17, she tolerated well, no significant AEs  REVIEW OF SYSTEMS:   Constitutional: Denies fevers, chills or abnormal weight loss, (+) fatigue  Eyes: Denies blurriness of vision Ears, nose, mouth, throat, and face: Denies mucositis or sore throat Respiratory: Denies cough, dyspnea or wheezes Cardiovascular: Denies palpitation, chest discomfort or lower extremity swelling Gastrointestinal:  Denies nausea, heartburn or change in bowel habits Skin: Denies abnormal skin rashes Lymphatics: Denies new lymphadenopathy or easy bruising Neurological:Denies numbness, tingling or new weaknesses Behavioral/Psych: Mood is stable, no new changes  All other systems were reviewed with the patient and are negative.  MEDICAL HISTORY:  Past Medical History:  Diagnosis Date   Abnormal x-ray of lungs with single pulmonary nodule 03/15/2016   [place] [facility]    Asthma    Cholelithiasis 05/19/2017   On CT   Coronary artery calcification seen on CAT scan 05/19/2017   Diabetes mellitus without complication (HCC)  [place]   Dilated idiopathic cardiomyopathy (HCC) [id number]  EF 15-20%. Diagnosed in [facility]   Headache    NONE RECENT   Hypertension    Lymph nodes enlarged 07/25/2020   Malignant neoplasm of lower-outer quadrant of left breast of female, estrogen receptor negative ([place]) 06/11/2017   left breast   Mixed hyperlipidemia 06/17/2018   partially blind    Personal history of chemotherapy 2018-2019   Personal history of radiation therapy 2019   Retinitis pigmentosa    Retroperitoneal lymphadenopathy 07/25/2020   Uveitis     SURGICAL HISTORY: Past Surgical History:  Procedure Laterality Date   brain cyst removed  2007   to help with headaches per patient , aspirated    BREAST BIOPSY Left 06/06/2017    x2   BREAST CYST ASPIRATION Left 06/06/2017   BREAST LUMPECTOMY Left 06/26/2017   BREAST LUMPECTOMY WITH RADIOACTIVE SEED AND SENTINEL LYMPH NODE BIOPSY Left 06/26/2017   Procedure: BREAST LUMPECTOMY WITH RADIOACTIVE SEED AND SENTINEL LYMPH NODE BIOPSY ERAS  PATHWAY;  Surgeon: [name], MD;  Location: [facility];  Service: General;  Laterality: Left;  pec block   FINE NEEDLE ASPIRATION Left 02/23/2019   Procedure: FINE NEEDLE ASPIRATION;  Surgeon: [name], MD;  Location: [facility];  Service: General;  Laterality: Left;  Asiration of left breast seroma    IR IMAGING GUIDED PORT INSERTION  10/13/2020   NASAL TURBINATE REDUCTION     PORT-A-CATH REMOVAL Right 02/23/2019   Procedure: REMOVAL PORT-A-CATH;  Surgeon: [name], MD;  Location: [facility];  Service: General;  Laterality: Right;   PORTACATH PLACEMENT N/A 06/26/2017   Procedure: INSERTION PORT-A-CATH;  Surgeon: [name], MD;  Location: [facility];  Service: General;  Laterality: N/A;   TRANSTHORACIC ECHOCARDIOGRAM  12/2015   A) [facility] May 2017: Mild concentric LVH. Global hypokinesis. GR daily. EF 18% severe LA dilation. Mitral annular dilatation with papillary muscle dysfunction and moderate MR. Dilated IVC consistent with elevated RAP.   TRANSTHORACIC ECHOCARDIOGRAM  05/2017; 08/2017   a) Mild concentric LVH.  EF 45 to 50% with diffuse hypokinesis.  GR 1 DD.  Mild aortic root dilation.;; b)  Mild LVH EF 45 to 50%.  Diffuse HK.  No significant valve disease.  No significant change.    I have reviewed the social history and family history with the patient and they are unchanged from previous note.  ALLERGIES:  is allergic to morphine and related, latex, tylenol with codeine #3 [acetaminophen-codeine], and penicillins.  MEDICATIONS:  Current Outpatient Medications  Medication Sig Dispense Refill   ciprofloxacin (CIPRO) 500 MG tablet Take 1 tablet (500 mg total) by mouth 2 (two) times  daily. 10 tablet 0   Accu-Chek FastClix Lancets MISC TEST TWICE A DAY. PT USES AN ACCU-CHEK GUIDE ME METER 102 each 2   aspirin 81 MG chewable tablet Chew 81 mg by mouth daily.     capecitabine (XELODA) 500 MG tablet Take 4 tabs in morning and 4 tabs in evening, every 12hrs, for 14 days then off for 7 days. Repeat every 21 days. Take within 30 minutes after meals. 112 tablet 1   carvedilol (COREG) 12.5 MG tablet Take 1 tablet (12.5 mg total) by mouth 2 (two) times daily with a meal. 180 tablet 1   Cholecalciferol (D3-1000 PO) Take by mouth.     diclofenac (VOLTAREN) 75 MG EC tablet Take 1 tablet (75 mg total) by mouth 2 (two) times daily. 60 tablet 1   furosemide (LASIX) 20 MG tablet Take 20 mg by  mouth daily. Patient is unsure of dose     glucose blood (ACCU-CHEK GUIDE) test strip Use as instructed to test blood sugar 2 times a day 100 strip 2   ibuprofen (ADVIL) 800 MG tablet Take 1 tablet (800 mg total) by mouth every 8 (eight) hours as needed. 30 tablet 2   lidocaine-prilocaine (EMLA) cream Apply 1 application topically as needed. 30 g 0   linaclotide (LINZESS) 72 MCG capsule Take 1 capsule (72 mcg total) by mouth daily before breakfast. 30 capsule 1   metFORMIN (GLUCOPHAGE) 500 MG tablet Take 500 mg by mouth 2 (two) times daily with a meal.     Multiple Vitamins-Minerals (WOMENS MULTI PO) Take by mouth.     prochlorperazine (COMPAZINE) 10 MG tablet Take 1 tablet (10 mg total) by mouth every 6 (six) hours as needed (Nausea or vomiting). 30 tablet 1   Saccharomyces boulardii (PROBIOTIC) 250 MG CAPS Take by mouth.     sacubitril-valsartan (ENTRESTO) 97-103 MG Take 1 tablet by mouth 2 (two) times daily. 180 tablet 3   Semaglutide (RYBELSUS) 3 MG TABS Take by mouth.     spironolactone (ALDACTONE) 25 MG tablet Take 1 tablet (25 mg total) by mouth daily. 90 tablet 3   vitamin B-12 (CYANOCOBALAMIN) 500 MCG tablet Take 500 mcg by mouth daily.     vitamin C (ASCORBIC ACID) 500 MG tablet Take 1,000 mg  2 (two) times daily by mouth.     No current facility-administered medications for this visit.   Facility-Administered Medications Ordered in Other Visits  Medication Dose Route Frequency Provider Last Rate Last Admin   heparin lock flush 100 unit/mL  500 Units Intravenous Once PRN [name], MD       sodium chloride flush (NS) 0.9 % injection 10 mL  10 mL Intravenous PRN [name], MD   10 mL at 02/27/21 1352    PHYSICAL EXAMINATION: ECOG PERFORMANCE STATUS: 2  Vitals:   02/27/21 1128  BP: (!) 152/108  Pulse: 80  Resp: 20  Temp: (!) 97.5 F (36.4 C)  SpO2: 100%    Filed Weights   02/27/21 1128  Weight: 214 lb 9.6 oz (97.3 kg)    GENERAL:alert,  mild distress  SKIN: skin color normal, no rashes or significant lesions EYES: normal, Conjunctiva are pink and non-injected, sclera clear  LUNG: clear breath sound bilateral, no wheezing or rhonchi NEURO: alert & oriented x 3 with fluent speech   LABORATORY DATA:  I have reviewed the data as listed CBC Latest Ref Rng & Units 02/27/2021 01/29/2021 01/19/2021  WBC 4.0 - 10.5 K/uL 5.7 7.0 5.0  Hemoglobin 12.0 - 15.0 g/dL 12.5 11.9(L) 11.5(L)  Hematocrit 36.0 - 46.0 % 36.8 35.3(L) 34.1(L)  Platelets 150 - 400 K/uL 334 239 256     CMP Latest Ref Rng & Units 02/27/2021 01/29/2021 01/19/2021  Glucose 70 - 99 mg/dL 237(H) 288(H) 423(H)  BUN 8 - 23 mg/dL _0 Creatinine 0.44 - 1.00 mg/dL 0.97 0.74 0.89  Sodium 135 - 145 mmol/L 141 136 138  Potassium 3.5 - 5.1 mmol/L 3.8 4.2 3.9  Chloride 98 - 111 mmol/L 106 105 103  CO2 22 - 32 mmol/L _1 Calcium 8.9 - 10.3 mg/dL 10.5(H) 9.5 9.3  Total Protein 6.5 - 8.1 g/dL 8.3(H) 7.8 7.8  Total Bilirubin 0.3 - 1.2 mg/dL 0.8 0.5 0.5  Alkaline Phos 38 - 126 U/L 112 98 116  AST 15 - 41 U/L 14(L) 17 13(L)  ALT 0 - 44 U/L _0 RADIOGRAPHIC STUDIES: I have personally reviewed the radiological images as listed and agreed with the findings in the report. No results found.    ASSESSMENT & PLAN:  Kristin Ward is a 65 y.o. female with    1. Left breast invasive ductal carcinoma, stage IB, p(T1cN0M0), Triple negative, Grade 3, in 2018, metastatic disease to right gluteal mass/iliac and diffuse node metastasis in 08/2020 ER 20% weakly + PR/HER2 (0) negative, PD-L1 0% -She was initially diagnosed in 05/2017. She has high risk for recurrence due to triple negative disease. She is s/p left breast lumpectomy, adjuvant chemo TC and Radiation.  -Genetic testing was negative for pathogenetic mutations.  -Due to recent right gluteal pain, she underwent biopsy on 09/02/20 which showed metastatic carcinoma, consistent with breast primary. ER 20% positive, ER and HER2 negative.  -I discussed with stage IV metastatic breast cancer, her cancer is no longer curable but still treatable. Goal of therapy is palliative to control her disease and prolong her life.  -I started her on first-line chemotherapy with weekly Taxol on 09/26/20 for 3 weeks on/ 1 week off. I discussed if she does well on chemo for 4-6 months, we can change treatment to antiestrogen therapy without chemo, given her ER weekly positive diease. Due to worsened neuropathy, Taxol stopped after C4 on 12/18/20. I switched her to Xeloda on 01/01/21 at 1573m in the AM and 20072min the PM 2 weeks on/1 week off.  -Her CT CAP from 01/17/21 which showed overall improved disease and interval decrease in size of metastatic adenopathy and mixed lytic and sclerotic bone mets. She does have stable surgical changes of left breast and borderline enlarged right inguinal LN which is new since last scan, possible reactive. I personally reviewed and discussed scan findings in great detail with patient today.  -She developed hematuria and back pain, no fever or leukocytosis.  This is suspicious for UTI. -She is off Xeloda this week, will check on her later this week, to see if she needs postpone next cycle Xeloda next Monday.  2.  UTI -She  developed hematuria and urinary frequency this morning, along with intermittent left flank pain -Urinalysis showed large amount of red blood cells and bacteria, culture still pending -I called in Cipro 500 mg twice daily for 5 days for her -Follow-up urine culture  3.  Uncontrolled hyperglycemia -She recently tried insulin, but it made her sick, and she has stopped. -She is on oral diabetic medicine, I strongly encouraged her to follow-up with PCP closely.    4. G2 peripheral Neuropathy  -S/p C2D8 Taxol she developed mild tingling in her hands and feet.  -She uses ice bags at home as needed.  -She continues to have moderate numbness in her hands and light tingling in her feet. She can take B12 or acupuncture. I encouraged her to remain active.  -Taxol stopped after 12/18/20. Her neuropathy is improving.  -Overall stable   5. HTN, COPD, retinitis pigmentosa with limited vision, Chronic combined systolic and diastolic heart failure, EF 45-50% -Continue to follow-up with PCP Dr HeRaenette Rovernd cardiologist Dr HaEllyn Hack   6. Goal of care discussion  -The patient understands the goal of care is palliative. -she is full code now    7. Financial and social support  -She notes she was getting bills from [place]. I discussed getting more help with our financial advocate.  -She has had episode  of deep depression since her metastatic cancer diagnosis. I encouraged her to f/u with SW for counseling. She is interested.      Plan -IVF with 500 cc normal saline over 90 minutes due to her CHF -I called in Cipro 500 mg twice daily for 5 days for UTI, will follow-up urine culture -She is offloaded this week, phone visit this Friday, to see if she is able to restart next cycle on next Monday.   No problem-specific Assessment & Plan notes found for this encounter.   Orders Placed This Encounter  Procedures   Urine Culture    Standing Status:   Future    Number of Occurrences:   1    Standing  Expiration Date:   02/27/2022   Urinalysis, Complete w Microscopic    Standing Status:   Future    Number of Occurrences:   1    Standing Expiration Date:   02/27/2022    All questions were answered. The patient knows to call the clinic with any problems, questions or concerns. No barriers to learning was detected. The total time spent in the appointment was 30 minutes.     [name], MD 02/27/2021

## 2021-02-27 NOTE — Patient Instructions (Signed)

## 2021-02-27 NOTE — Progress Notes (Signed)
MD Burr Medico is aware pt is experiencing painful urination, blood in urine (pt stated), diaphoretic, and vomiting. Pt is now at doctors appt. Labs collected.

## 2021-02-28 ENCOUNTER — Other Ambulatory Visit: Payer: Medicare Other

## 2021-02-28 ENCOUNTER — Telehealth: Payer: Self-pay | Admitting: Hematology

## 2021-02-28 ENCOUNTER — Ambulatory Visit: Payer: Medicare Other | Admitting: Hematology

## 2021-02-28 LAB — URINE CULTURE: Culture: 30000 — AB

## 2021-02-28 LAB — CANCER ANTIGEN 27.29: CA 27.29: 51.4 U/mL — ABNORMAL HIGH (ref 0.0–38.6)

## 2021-02-28 LAB — CANCER ANTIGEN 15-3: CA 15-3: 39.7 U/mL — ABNORMAL HIGH (ref 0.0–25.0)

## 2021-02-28 NOTE — Telephone Encounter (Signed)
Left message with follow-up appointment per 7/19 los. 

## 2021-03-02 ENCOUNTER — Other Ambulatory Visit: Payer: Self-pay

## 2021-03-02 ENCOUNTER — Inpatient Hospital Stay (HOSPITAL_BASED_OUTPATIENT_CLINIC_OR_DEPARTMENT_OTHER): Payer: Medicare Other | Admitting: Hematology

## 2021-03-02 ENCOUNTER — Other Ambulatory Visit (HOSPITAL_COMMUNITY): Payer: Self-pay

## 2021-03-02 DIAGNOSIS — C50512 Malignant neoplasm of lower-outer quadrant of left female breast: Secondary | ICD-10-CM | POA: Diagnosis not present

## 2021-03-02 DIAGNOSIS — Z171 Estrogen receptor negative status [ER-]: Secondary | ICD-10-CM

## 2021-03-02 MED ORDER — CAPECITABINE 500 MG PO TABS
ORAL_TABLET | ORAL | 1 refills | Status: DC
  Filled 2021-03-02: qty 112, fill #0
  Filled 2021-03-29: qty 112, 21d supply, fill #0
  Filled 2021-04-17: qty 112, 21d supply, fill #1

## 2021-03-02 NOTE — Progress Notes (Signed)
[place]   Telephone:([phone]) [phone] Fax:([phone]) [phone]   Clinic Follow up Note   Patient Care Team: Girtha Rm, NP-C as PCP - General (Family Medicine) [name], MD as PCP - Cardiology (Cardiology) [name], MD as Consulting Physician (Hematology) [name], MD as Consulting Physician (General Surgery)  Date of Service:  03/02/2021  I connected with [name] on 03/02/21 at 12:50 PM EDT by telephone and verified that I am speaking with the correct person using two identifiers.   I discussed the limitations, risks, security and privacy concerns of performing an evaluation and management service by telephone and the availability of in person appointments. I also discussed with the patient that there may be a patient responsible charge related to this service. The patient expressed understanding and agreed to proceed.   Patient's location:  home  Provider's location:  Office   CHIEF COMPLAINT: F/u of metastatic left breast cancer   SUMMARY OF ONCOLOGIC HISTORY: Oncology History Overview Note  Cancer Staging Malignant neoplasm of lower-outer quadrant of left breast of female, estrogen receptor negative ([place]) Staging form: Breast, AJCC 8th Edition - Clinical stage from 06/06/2017: Stage IB (cT1c, cN0, cM0, G3, ER: Negative, PR: Negative, HER2: Negative) - Signed by [name], MD on 06/15/2017 - Pathologic stage from 06/26/2017: Stage IB (pT1c, pN0, cM0, G3, ER: Negative, PR: Negative, HER2: Negative) - Signed by [name], MD on 07/10/2017     Malignant neoplasm of lower-outer quadrant of left breast of female, estrogen receptor negative ([place])  05/30/2017 Mammogram   Diagnostic mammo and US IMPRESSION: 1. Highly suspicious 1.4 cm mass in the slightly lower slightly outer left breast -tissue sampling recommended. 2. Indeterminate 0.5 mm mass in the slightly lower slightly outer left breast -tissue sampling recommended. 3. At least 2 left  axillary lymph nodes with borderline cortical thickness.    06/06/2017 Initial Biopsy   Diagnosis 1. Breast, left, needle core biopsy, 5:30 o'clock - INVASIVE DUCTAL CARCINOMA, G3 2. Lymph node, needle/core biopsy, left axillary - NO CARCINOMA IDENTIFIED IN ONE LYMPH NODE (0/1)    06/06/2017 Initial Diagnosis   Malignant neoplasm of lower-outer quadrant of left breast of female, estrogen receptor negative ([place])    06/06/2017 Receptors her2   Estrogen Receptor: 0%, NEGATIVE Progesterone Receptor: 0%, NEGATIVE Proliferation Marker Ki67: 70%    06/26/2017 Surgery   LEFT BREAST LUMPECTOMY WITH RADIOACTIVE SEED AND SENTINEL LYMPH NODE BIOPSY ERAS  PATHWAY AND INSERTION PORT-A-CATH By Dr. Barry Dienes on 06/26/17     06/26/2017 Pathology Results   Diagnosis 06/26/17  1. Breast, lumpectomy, Left - INVASIVE DUCTAL CARCINOMA, GRADE III/III, SPANNING 1.2 CM. - THE SURGICAL RESECTION MARGINS ARE NEGATIVE FOR CARCINOMA. - SEE ONCOLOGY TABLE BELOW. 2. Lymph node, sentinel, biopsy, Left axillary #1 - THERE IS NO EVIDENCE OF CARCINOMA IN 1 OF 1 LYMPH NODE (0/1). 3. Lymph node, sentinel, biopsy, Left axillary #2 - THERE IS NO EVIDENCE OF CARCINOMA IN 1 OF 1 LYMPH NODE (0/1). 4. Lymph node, sentinel, biopsy, Left axillary #3 - THERE IS NO EVIDENCE OF CARCINOMA IN 1 OF 1 LYMPH NODE (0/1).     07/25/2017 - 09/29/2017 Chemotherapy   Adjuvant cytoxan and docetaxel (TC) every 3 weekd for 4 cycles      11/19/2017 - 12/17/2017 Radiation Therapy   Adjuvant breast radiation Left breast treated to 42.5 Gy with 17 fx of 2.5 Gy followed by a boost of 7.5 Gy with 3 fx of 2.5 Gy      02/2019  Procedure   She had PAC removal in 02/2019.    07/23/2020 Imaging   MRI Lumbar Spine 07/23/20 IMPRESSION: 1. No significant disc herniation, spinal canal or neural foraminal stenosis at any level. 2. Moderate facet degenerative changes at L3-4, L4-5 and L5-S1. 3. Multiple enlarged retroperitoneal and right  iliac lymph nodes. Recommend correlation with CT of the abdomen and pelvis with contrast.   08/15/2020 Imaging   CT AP 08/15/20  IMPRESSION: 1. Right iliac and periaortic adenopathy is noted concerning for metastatic disease or lymphoma. Also noted is abnormal soft tissue mass anterior and posterior to the right iliac wing concerning for malignancy or metastatic disease. MRI is recommended for further evaluation. 2. Irregular lucency with sclerotic margins is seen involving the right iliac crest concerning for possible lytic lesion. MRI may be performed for further evaluation. 3. Enlarged fibroid uterus. 4. Small gallstone. 5. Aortic atherosclerosis.   08/22/2020 Pathology Results   FINAL MICROSCOPIC DIAGNOSIS: 08/22/20  A. NEEDLE CORE, RIGHT GLUTEUS MINIMUMUS, BIOPSY:  -  Metastatic carcinoma  -  See comment   COMMENT:   By immunohistochemistry, the neoplastic cells are positive for  cytokeratin-7 and have weak patchy positivity for GATA3 but are negative  for cytokeratin-20 and GCDFP.  The combined morphology and  immunophenotype, support metastasis from the patient's known breast  primary carcinoma.  Dr. Burr Medico was notified of these results on 08/24/2020.  Prognostic markers (ER, PR, Her2, PDL-1) are pending and will be  reported in an addendum   ADDENDUM:   PROGNOSTIC INDICATOR RESULTS:   Immunohistochemical and morphometric analysis performed manually   The tumor cells are NEGATIVE for Her2 (0).   Estrogen Receptor:       POSITIVE, 20%, WEAK STAINING  Progesterone Receptor:   NEGATIVE    PD-L1 (0) negative    09/04/2020 - 09/22/2020 Radiation Therapy   Palliative radiation to right iliac 09/04/20 - 09/22/20, Dr. Lisbeth Renshaw   09/26/2020 -  Chemotherapy   first line weekly Taxol 3 weeks on/1 week off starting 09/26/20 --Reduced to 2 weeks on/1 week off starting with C3 due to neuropathy. Stopped after C4 on 12/18/20 due to neuratrophy.  --Switched to Xeloda on 01/01/21 at 1556m  in the AM and 20060min the PM for 2 weeks on/1 week off     10/13/2020 Procedure    PAC placement on 10/13/20.       CURRENT THERAPY:  -Due to Neuropathy from first line Taxol, I switched to Xeloda on 01/01/21 at 15004mn the AM and 2000m24m the PM for 2 weeks on/1 week off. Increased to full dose 2000mg57m with C2 on 01/22/21.  -Zometa q3mont4monthting 11/20/20   INTERVAL HISTORY:  PatricSumayyah Custodios is scheduled for a phone visit for follow-up.  I saw her 4 days ago in office, she had UTI.  She started the Cipro and the next day, and has been feeling much better.  Her back pain has resolved, no more hematuria, her appetite and energy level has much improved.  She has no other new complaints.  All other systems were reviewed with the patient and are negative.  MEDICAL HISTORY:  Past Medical History:  Diagnosis Date   Abnormal x-ray of lungs with single pulmonary nodule 03/15/2016   [facility]    Asthma    Cholelithiasis 05/19/2017   On CT   Coronary artery calcification seen on CAT scan 05/19/2017   Diabetes mellitus without complication (HCC)[place]  type 2   Dilated idiopathic cardiomyopathy ([place]) 12/2015   EF 15-20%. Diagnosed in [facility]   Headache    NONE RECENT   Hypertension    Lymph nodes enlarged 07/25/2020   Malignant neoplasm of lower-outer quadrant of left breast of female, estrogen receptor negative ([place]) 06/11/2017   left breast   Mixed hyperlipidemia 06/17/2018   partially blind    Personal history of chemotherapy 2018-2019   Personal history of radiation therapy 2019   Retinitis pigmentosa    Retroperitoneal lymphadenopathy 07/25/2020   Uveitis     SURGICAL HISTORY: Past Surgical History:  Procedure Laterality Date   brain cyst removed  2007   to help with headaches per patient , aspirated    BREAST BIOPSY Left 06/06/2017   x2   BREAST CYST ASPIRATION Left 06/06/2017   BREAST LUMPECTOMY Left 06/26/2017    BREAST LUMPECTOMY WITH RADIOACTIVE SEED AND SENTINEL LYMPH NODE BIOPSY Left 06/26/2017   Procedure: BREAST LUMPECTOMY WITH RADIOACTIVE SEED AND SENTINEL LYMPH NODE BIOPSY ERAS  PATHWAY;  Surgeon: [name], MD;  Location: [place];  Service: General;  Laterality: Left;  pec block   FINE NEEDLE ASPIRATION Left 02/23/2019   Procedure: FINE NEEDLE ASPIRATION;  Surgeon: [name], MD;  Location: [place];  Service: General;  Laterality: Left;  Asiration of left breast seroma    IR IMAGING GUIDED PORT INSERTION  10/13/2020   NASAL TURBINATE REDUCTION     PORT-A-CATH REMOVAL Right 02/23/2019   Procedure: REMOVAL PORT-A-CATH;  Surgeon: [name], MD;  Location: [place];  Service: General;  Laterality: Right;   PORTACATH PLACEMENT N/A 06/26/2017   Procedure: INSERTION PORT-A-CATH;  Surgeon: [name], MD;  Location: [place];  Service: General;  Laterality: N/A;   TRANSTHORACIC ECHOCARDIOGRAM  12/2015   A) [facility] May 2017: Mild concentric LVH. Global hypokinesis. GR daily. EF 18% severe LA dilation. Mitral annular dilatation with papillary muscle dysfunction and moderate MR. Dilated IVC consistent with elevated RAP.   TRANSTHORACIC ECHOCARDIOGRAM  05/2017; 08/2017   a) Mild concentric LVH.  EF 45 to 50% with diffuse hypokinesis.  GR 1 DD.  Mild aortic root dilation.;; b)  Mild LVH EF 45 to 50%.  Diffuse HK.  No significant valve disease.  No significant change.    I have reviewed the social history and family history with the patient and they are unchanged from previous note.  ALLERGIES:  is allergic to morphine and related, latex, tylenol with codeine #3 [acetaminophen-codeine], and penicillins.  MEDICATIONS:  Current Outpatient Medications  Medication Sig Dispense Refill   Accu-Chek FastClix Lancets MISC TEST TWICE A DAY. PT USES AN ACCU-CHEK GUIDE ME METER 102 each 2   aspirin 81 MG chewable tablet Chew 81 mg by mouth daily.     capecitabine  (XELODA) 500 MG tablet Take 4 tabs in morning and 4 tabs in evening, every 12hrs, for 14 days then off for 7 days. Repeat every 21 days. Take within 30 minutes after meals. 112 tablet 1   carvedilol (COREG) 12.5 MG tablet Take 1 tablet (12.5 mg total) by mouth 2 (two) times daily with a meal. 180 tablet 1   Cholecalciferol (D3-1000 PO) Take by mouth.     ciprofloxacin (CIPRO) 500 MG tablet Take 1 tablet (500 mg total) by mouth 2 (two) times daily. 10 tablet 0   diclofenac (VOLTAREN) 75 MG EC tablet Take 1 tablet (75 mg total) by mouth 2 (two) times daily. 60 tablet 1  furosemide (LASIX) 20 MG tablet Take 20 mg by mouth daily. Patient is unsure of dose     glucose blood (ACCU-CHEK GUIDE) test strip Use as instructed to test blood sugar 2 times a day 100 strip 2   ibuprofen (ADVIL) 800 MG tablet Take 1 tablet (800 mg total) by mouth every 8 (eight) hours as needed. 30 tablet 2   lidocaine-prilocaine (EMLA) cream Apply 1 application topically as needed. 30 g 0   linaclotide (LINZESS) 72 MCG capsule Take 1 capsule (72 mcg total) by mouth daily before breakfast. 30 capsule 1   metFORMIN (GLUCOPHAGE) 500 MG tablet Take 500 mg by mouth 2 (two) times daily with a meal.     Multiple Vitamins-Minerals (WOMENS MULTI PO) Take by mouth.     prochlorperazine (COMPAZINE) 10 MG tablet Take 1 tablet (10 mg total) by mouth every 6 (six) hours as needed (Nausea or vomiting). 30 tablet 1   Saccharomyces boulardii (PROBIOTIC) 250 MG CAPS Take by mouth.     sacubitril-valsartan (ENTRESTO) 97-103 MG Take 1 tablet by mouth 2 (two) times daily. 180 tablet 3   Semaglutide (RYBELSUS) 3 MG TABS Take by mouth.     spironolactone (ALDACTONE) 25 MG tablet Take 1 tablet (25 mg total) by mouth daily. 90 tablet 3   vitamin B-12 (CYANOCOBALAMIN) 500 MCG tablet Take 500 mcg by mouth daily.     vitamin C (ASCORBIC ACID) 500 MG tablet Take 1,000 mg 2 (two) times daily by mouth.     No current facility-administered medications for  this visit.    PHYSICAL EXAMINATION: ECOG PERFORMANCE STATUS: 2  There were no vitals filed for this visit.  No exam   LABORATORY DATA:  I have reviewed the data as listed CBC Latest Ref Rng & Units 02/27/2021 01/29/2021 01/19/2021  WBC 4.0 - 10.5 K/uL 5.7 7.0 5.0  Hemoglobin 12.0 - 15.0 g/dL 12.5 11.9(L) 11.5(L)  Hematocrit 36.0 - 46.0 % 36.8 35.3(L) 34.1(L)  Platelets 150 - 400 K/uL 334 239 256     CMP Latest Ref Rng & Units 02/27/2021 01/29/2021 01/19/2021  Glucose 70 - 99 mg/dL 237(H) 288(H) 423(H)  BUN 8 - 23 mg/dL _0 Creatinine 0.44 - 1.00 mg/dL 0.97 0.74 0.89  Sodium 135 - 145 mmol/L 141 136 138  Potassium 3.5 - 5.1 mmol/L 3.8 4.2 3.9  Chloride 98 - 111 mmol/L 106 105 103  CO2 22 - 32 mmol/L _1 Calcium 8.9 - 10.3 mg/dL 10.5(H) 9.5 9.3  Total Protein 6.5 - 8.1 g/dL 8.3(H) 7.8 7.8  Total Bilirubin 0.3 - 1.2 mg/dL 0.8 0.5 0.5  Alkaline Phos 38 - 126 U/L 112 98 116  AST 15 - 41 U/L 14(L) 17 13(L)  ALT 0 - 44 U/L _2 RADIOGRAPHIC STUDIES: I have personally reviewed the radiological images as listed and agreed with the findings in the report. No results found.   ASSESSMENT & PLAN:  Kristin Ward is a 65 y.o. female with    1. Left breast invasive ductal carcinoma, stage IB, p(T1cN0M0), Triple negative, Grade 3, in 2018, metastatic disease to right gluteal mass/iliac and diffuse node metastasis in 08/2020 ER 20% weakly + PR/HER2 (0) negative, PD-L1 0% -She was initially diagnosed in 05/2017. She has high risk for recurrence due to triple negative disease. She is s/p left breast lumpectomy, adjuvant chemo TC and Radiation.  -Genetic testing was negative for pathogenetic mutations.  -Due to recent right  gluteal pain, she underwent biopsy on 09/02/20 which showed metastatic carcinoma, consistent with breast primary. ER 20% positive, ER and HER2 negative.  -I discussed with stage IV metastatic breast cancer, her cancer is no longer curable but still  treatable. Goal of therapy is palliative to control her disease and prolong her life.  -I started her on first-line chemotherapy with weekly Taxol on 09/26/20 for 3 weeks on/ 1 week off. I discussed if she does well on chemo for 4-6 months, we can change treatment to antiestrogen therapy without chemo, given her ER weekly positive diease. Due to worsened neuropathy, Taxol stopped after C4 on 12/18/20. I switched her to Xeloda on 01/01/21 at 1561m in the AM and 20051min the PM 2 weeks on/1 week off.  -Her CT CAP from 01/17/21 which showed overall improved disease and interval decrease in size of metastatic adenopathy and mixed lytic and sclerotic bone mets. She does have stable surgical changes of left breast and borderline enlarged right inguinal LN which is new since last scan, possible reactive. I personally reviewed and discussed scan findings in great detail with patient today.  -She has recovered well from UTI this week, will finish Cipro tomorrow -We will start next cycle Xeloda on Monday or Tuesday next week  2.  UTI -She clinically responded to Cipro -Urine culture showed multiple species  3.  Uncontrolled hyperglycemia -She recently tried insulin, but it made her sick, and she has stopped. -She is on oral diabetic medicine, I strongly encouraged her to follow-up with PCP closely.    4. G2 peripheral Neuropathy  -S/p C2D8 Taxol she developed mild tingling in her hands and feet.  -She uses ice bags at home as needed.  -She continues to have moderate numbness in her hands and light tingling in her feet. She can take B12 or acupuncture. I encouraged her to remain active.  -Taxol stopped after 12/18/20. Her neuropathy is improving.  -Overall stable   5. HTN, COPD, retinitis pigmentosa with limited vision, Chronic combined systolic and diastolic heart failure, EF 45-50% -Continue to follow-up with PCP Dr HeRaenette Rovernd cardiologist Dr HaEllyn Hack   6. Goal of care discussion  -The patient  understands the goal of care is palliative. -she is full code now    7. Financial and social support  -She notes she was getting bills from [place]. I discussed getting more help with our financial advocate.  -She has had episode of deep depression since her metastatic cancer diagnosis. I encouraged her to f/u with SW for counseling. She is interested.      Plan -she has recovered well from UTI, will complete the Cipro tomorrow -Start next cycle Xeloda on Monday or Tuesday next week, I refilled for her  -Follow-up in 3 weeks  No problem-specific Assessment & Plan notes found for this encounter.   No orders of the defined types were placed in this encounter.   I discussed the assessment and treatment plan with the patient. The patient was provided an opportunity to ask questions and all were answered. The patient agreed with the plan and demonstrated an understanding of the instructions.   The patient was advised to call back or seek an in-person evaluation if the symptoms worsen or if the condition fails to improve as anticipated.  I provided 12 minutes of non face-to-face telephone visit time during this encounter, and > 50% was spent counseling as documented under my assessment & plan.     YaTruitt MerleMD  03/02/2021

## 2021-03-03 ENCOUNTER — Other Ambulatory Visit (HOSPITAL_COMMUNITY): Payer: Self-pay

## 2021-03-05 ENCOUNTER — Telehealth: Payer: Self-pay | Admitting: Hematology

## 2021-03-05 NOTE — Telephone Encounter (Signed)
Scheduled follow-up appointment per 7/22 los. Patient is aware.

## 2021-03-06 ENCOUNTER — Encounter: Payer: Self-pay | Admitting: Cardiology

## 2021-03-06 NOTE — Assessment & Plan Note (Addendum)
Class I-II symptoms now on stable regimen.  No change to current meds.   Continue current dose of carvedilol 12.5 mg twice daily, Entresto 97/103 mg twice daily, spironolactone 20 mg daily along with 20 mg Lasix daily.

## 2021-03-06 NOTE — Assessment & Plan Note (Signed)
Nonocclusive disease on ischemic evaluation.  EF stable.  Target LDL should be less than 70, I do not see her on statin.  Need to be addressed.  She is on beta-blocker and ARNI. On aspirin.

## 2021-03-06 NOTE — Assessment & Plan Note (Addendum)
Blood pressure looks pretty good today. Has not required hydralazine.  Continue current meds

## 2021-03-06 NOTE — Assessment & Plan Note (Signed)
Due for follow-up lipids to be checked.  We can add to labs for upcoming lab draw (within next access report) she is no longer on statin.Marland Kitchen

## 2021-03-06 NOTE — Assessment & Plan Note (Signed)
NYHA class I-II CHF symptoms on stable regimen of carvedilol, Entresto, spironolactone and low-dose Lasix.  Barely requiring any PRN Lasix dosing.  Blood pressures are definitely more stable.

## 2021-03-06 NOTE — Assessment & Plan Note (Signed)
Notable weight loss.  Doing well.  Encouraged her efforts.  I think a lot of this has to do with her ongoing cancer treatment.  On the right side, weight loss is helpful for her generalized health.

## 2021-03-08 ENCOUNTER — Ambulatory Visit (INDEPENDENT_AMBULATORY_CARE_PROVIDER_SITE_OTHER): Payer: Medicare Other | Admitting: Family Medicine

## 2021-03-08 ENCOUNTER — Other Ambulatory Visit: Payer: Self-pay

## 2021-03-08 VITALS — BP 142/90 | HR 74 | Temp 97.1°F | Wt 217.0 lb

## 2021-03-08 DIAGNOSIS — E1165 Type 2 diabetes mellitus with hyperglycemia: Secondary | ICD-10-CM | POA: Diagnosis not present

## 2021-03-08 DIAGNOSIS — C50512 Malignant neoplasm of lower-outer quadrant of left female breast: Secondary | ICD-10-CM | POA: Diagnosis not present

## 2021-03-08 DIAGNOSIS — H543 Unqualified visual loss, both eyes: Secondary | ICD-10-CM | POA: Diagnosis not present

## 2021-03-08 DIAGNOSIS — E782 Mixed hyperlipidemia: Secondary | ICD-10-CM

## 2021-03-08 DIAGNOSIS — Z171 Estrogen receptor negative status [ER-]: Secondary | ICD-10-CM | POA: Diagnosis not present

## 2021-03-08 MED ORDER — FREESTYLE LIBRE READER DEVI: 1.0000 | Freq: Once | 0 refills | Status: AC

## 2021-03-08 MED ORDER — FREESTYLE LIBRE 14 DAY SENSOR MISC: 1.0000 | 4 refills | Status: DC

## 2021-03-08 NOTE — Patient Instructions (Signed)
Start back taking the atorvastatin.  Keep track of your blood sugars and we will check on getting you the monitoring device

## 2021-03-08 NOTE — Progress Notes (Signed)
   Subjective:    Patient ID: Kristin Ward, female    DOB: Apr 25, 1956, 65 y.o.   MRN: [id number]  HPI She is here for consult concerning her diabetes.  Her blood sugars have been quite variable recently most recent case due to a UTI causing her blood sugars to become quite elevated.  She also has been dealing with breast cancer and is presently being treated by oncology for this.  Because her blood sugars were quite elevated she was given Basaglar however she had adverse reaction from this causing her to be nauseated and weak and notes that her blood sugars were still running in the 200 range.  She has stopped taking this and now is just taking metformin and Rybelsus.  She was on a statin drug in the past but had stopped taking it due to her perceived problems with the statin and vitamin D.  She is interested in getting continuous glucose monitoring.  She states that she can still see the cell phone and now probably able to use a monitor.   Review of Systems     Objective:   Physical Exam Alert and in no distress otherwise not examined       Assessment & Plan:  Malignant neoplasm of lower-outer quadrant of left breast of female, estrogen receptor negative ([place])  Mixed hyperlipidemia - Plan: Lipid panel  Blind in both eyes  Uncontrolled type 2 diabetes mellitus with hyperglycemia ([place]) I had a long discussion with her concerning her diabetes in regard to more frequent but smaller meals, keeping her blood sugars down within a good range.  We will attempt to get her on continuous glucose monitoring.  I then discussed the statin drug with her and she will start taking it again as she does have 2 bottles of it at home.  Continue on Rybelsus and metformin.  Follow-up here in 3 months.  33 minutes spent discussing all these issues with her as well as coordination of care.  Recheck here in 3 months.

## 2021-03-09 LAB — LIPID PANEL
Chol/HDL Ratio: 4.5 ratio — ABNORMAL HIGH (ref 0.0–4.4)
Cholesterol, Total: 206 mg/dL — ABNORMAL HIGH (ref 100–199)
HDL: 46 mg/dL (ref >39)
LDL Chol Calc (NIH): 127 mg/dL — ABNORMAL HIGH (ref 0–99)
Triglycerides: 186 mg/dL — ABNORMAL HIGH (ref 0–149)
VLDL Cholesterol Cal: 33 mg/dL (ref 5–40)

## 2021-03-19 ENCOUNTER — Other Ambulatory Visit (HOSPITAL_COMMUNITY): Payer: Self-pay

## 2021-03-19 DIAGNOSIS — H35353 Cystoid macular degeneration, bilateral: Secondary | ICD-10-CM | POA: Diagnosis not present

## 2021-03-19 DIAGNOSIS — H5010 Unspecified exotropia: Secondary | ICD-10-CM | POA: Diagnosis not present

## 2021-03-19 DIAGNOSIS — H20013 Primary iridocyclitis, bilateral: Secondary | ICD-10-CM | POA: Diagnosis not present

## 2021-03-19 DIAGNOSIS — H179 Unspecified corneal scar and opacity: Secondary | ICD-10-CM | POA: Diagnosis not present

## 2021-03-19 DIAGNOSIS — E119 Type 2 diabetes mellitus without complications: Secondary | ICD-10-CM | POA: Diagnosis not present

## 2021-03-19 DIAGNOSIS — H2513 Age-related nuclear cataract, bilateral: Secondary | ICD-10-CM | POA: Diagnosis not present

## 2021-03-19 DIAGNOSIS — H209 Unspecified iridocyclitis: Secondary | ICD-10-CM | POA: Diagnosis not present

## 2021-03-19 DIAGNOSIS — H43812 Vitreous degeneration, left eye: Secondary | ICD-10-CM | POA: Diagnosis not present

## 2021-03-19 DIAGNOSIS — H3552 Pigmentary retinal dystrophy: Secondary | ICD-10-CM | POA: Diagnosis not present

## 2021-03-23 DIAGNOSIS — C50919 Malignant neoplasm of unspecified site of unspecified female breast: Secondary | ICD-10-CM | POA: Diagnosis not present

## 2021-03-23 DIAGNOSIS — Z87891 Personal history of nicotine dependence: Secondary | ICD-10-CM | POA: Diagnosis not present

## 2021-03-23 DIAGNOSIS — Z91040 Latex allergy status: Secondary | ICD-10-CM | POA: Diagnosis not present

## 2021-03-23 DIAGNOSIS — Z885 Allergy status to narcotic agent status: Secondary | ICD-10-CM | POA: Diagnosis not present

## 2021-03-23 DIAGNOSIS — N1330 Unspecified hydronephrosis: Secondary | ICD-10-CM | POA: Diagnosis not present

## 2021-03-23 DIAGNOSIS — Z743 Need for continuous supervision: Secondary | ICD-10-CM | POA: Diagnosis not present

## 2021-03-23 DIAGNOSIS — N200 Calculus of kidney: Secondary | ICD-10-CM | POA: Diagnosis not present

## 2021-03-23 DIAGNOSIS — C50912 Malignant neoplasm of unspecified site of left female breast: Secondary | ICD-10-CM | POA: Diagnosis not present

## 2021-03-23 DIAGNOSIS — R109 Unspecified abdominal pain: Secondary | ICD-10-CM | POA: Diagnosis not present

## 2021-03-26 ENCOUNTER — Inpatient Hospital Stay: Payer: Medicare Other

## 2021-03-26 ENCOUNTER — Encounter: Payer: Self-pay | Admitting: Hematology

## 2021-03-26 ENCOUNTER — Other Ambulatory Visit: Payer: Self-pay

## 2021-03-26 ENCOUNTER — Inpatient Hospital Stay: Payer: Medicare Other | Attending: Hematology | Admitting: Hematology

## 2021-03-26 VITALS — BP 139/90 | HR 80 | Temp 98.4°F | Resp 18 | Ht 67.0 in | Wt 220.5 lb

## 2021-03-26 DIAGNOSIS — Z171 Estrogen receptor negative status [ER-]: Secondary | ICD-10-CM

## 2021-03-26 DIAGNOSIS — C50512 Malignant neoplasm of lower-outer quadrant of left female breast: Secondary | ICD-10-CM | POA: Diagnosis not present

## 2021-03-26 DIAGNOSIS — C7951 Secondary malignant neoplasm of bone: Secondary | ICD-10-CM | POA: Insufficient documentation

## 2021-03-26 DIAGNOSIS — Z170 Estrogen receptor positive status [ER+]: Secondary | ICD-10-CM | POA: Insufficient documentation

## 2021-03-26 DIAGNOSIS — Z452 Encounter for adjustment and management of vascular access device: Secondary | ICD-10-CM | POA: Diagnosis not present

## 2021-03-26 DIAGNOSIS — I110 Hypertensive heart disease with heart failure: Secondary | ICD-10-CM | POA: Diagnosis not present

## 2021-03-26 DIAGNOSIS — Z95828 Presence of other vascular implants and grafts: Secondary | ICD-10-CM

## 2021-03-26 DIAGNOSIS — E1140 Type 2 diabetes mellitus with diabetic neuropathy, unspecified: Secondary | ICD-10-CM | POA: Diagnosis not present

## 2021-03-26 LAB — CBC WITH DIFFERENTIAL/PLATELET
Abs Immature Granulocytes: 0.02 10*3/uL (ref 0.00–0.07)
Basophils Absolute: 0 10*3/uL (ref 0.0–0.1)
Basophils Relative: 0 %
Eosinophils Absolute: 0.3 10*3/uL (ref 0.0–0.5)
Eosinophils Relative: 6 %
HCT: 33.7 % — ABNORMAL LOW (ref 36.0–46.0)
Hemoglobin: 11.3 g/dL — ABNORMAL LOW (ref 12.0–15.0)
Immature Granulocytes: 0 %
Lymphocytes Relative: 16 %
Lymphs Abs: 0.8 10*3/uL (ref 0.7–4.0)
MCH: 31.8 pg (ref 26.0–34.0)
MCHC: 33.5 g/dL (ref 30.0–36.0)
MCV: 94.9 fL (ref 80.0–100.0)
Monocytes Absolute: 0.4 10*3/uL (ref 0.1–1.0)
Monocytes Relative: 8 %
Neutro Abs: 3.4 10*3/uL (ref 1.7–7.7)
Neutrophils Relative %: 70 %
Platelets: 243 10*3/uL (ref 150–400)
RBC: 3.55 MIL/uL — ABNORMAL LOW (ref 3.87–5.11)
RDW: 16.1 % — ABNORMAL HIGH (ref 11.5–15.5)
WBC: 4.9 10*3/uL (ref 4.0–10.5)
nRBC: 0 % (ref 0.0–0.2)

## 2021-03-26 LAB — COMPREHENSIVE METABOLIC PANEL
ALT: 21 U/L (ref 0–44)
AST: 23 U/L (ref 15–41)
Albumin: 3.7 g/dL (ref 3.5–5.0)
Alkaline Phosphatase: 92 U/L (ref 38–126)
Anion gap: 9 (ref 5–15)
BUN: 13 mg/dL (ref 8–23)
CO2: 25 mmol/L (ref 22–32)
Calcium: 9.3 mg/dL (ref 8.9–10.3)
Chloride: 108 mmol/L (ref 98–111)
Creatinine, Ser: 0.84 mg/dL (ref 0.44–1.00)
GFR, Estimated: >60 mL/min (ref >60)
Glucose, Bld: 157 mg/dL — ABNORMAL HIGH (ref 70–99)
Potassium: 4.2 mmol/L (ref 3.5–5.1)
Sodium: 142 mmol/L (ref 135–145)
Total Bilirubin: 0.6 mg/dL (ref 0.3–1.2)
Total Protein: 7.3 g/dL (ref 6.5–8.1)

## 2021-03-26 MED ORDER — SODIUM CHLORIDE 0.9% FLUSH
10.0000 mL | INTRAVENOUS | Status: DC | PRN
  Administered 2021-03-26: 10 mL via INTRAVENOUS

## 2021-03-26 MED ORDER — HEPARIN SOD (PORK) LOCK FLUSH 100 UNIT/ML IV SOLN
500.0000 [IU] | Freq: Once | INTRAVENOUS | Status: AC | PRN
  Administered 2021-03-26: 500 [IU] via INTRAVENOUS

## 2021-03-26 NOTE — Progress Notes (Signed)
[place]   [phone] [phone]   Clinic Follow up Note   Patient Care Team: Girtha Rm, NP-C as PCP - General (Family Medicine) [name], MD as PCP - Cardiology (Cardiology) [name], MD as Consulting Physician (Hematology) [name], MD as Consulting Physician (General Surgery)  Date of Service:  03/26/2021  CHIEF COMPLAINT: f/u of metastatic left breast cancer  SUMMARY OF ONCOLOGIC HISTORY: Oncology History Overview Note  Cancer Staging Malignant neoplasm of lower-outer quadrant of left breast of female, estrogen receptor negative ([place]) Staging form: Breast, AJCC 8th Edition - Clinical stage from 06/06/2017: Stage IB (cT1c, cN0, cM0, G3, ER: Negative, PR: Negative, HER2: Negative) - Signed by [name], MD on 06/15/2017 - Pathologic stage from 06/26/2017: Stage IB (pT1c, pN0, cM0, G3, ER: Negative, PR: Negative, HER2: Negative) - Signed by [name], MD on 07/10/2017     Malignant neoplasm of lower-outer quadrant of left breast of female, estrogen receptor negative ([place])  05/30/2017 Mammogram   Diagnostic mammo and US IMPRESSION: 1. Highly suspicious 1.4 cm mass in the slightly lower slightly outer left breast -tissue sampling recommended. 2. Indeterminate 0.5 mm mass in the slightly lower slightly outer left breast -tissue sampling recommended. 3. At least 2 left axillary lymph nodes with borderline cortical thickness.   06/06/2017 Initial Biopsy   Diagnosis 1. Breast, left, needle core biopsy, 5:30 o'clock - INVASIVE DUCTAL CARCINOMA, G3 2. Lymph node, needle/core biopsy, left axillary - NO CARCINOMA IDENTIFIED IN ONE LYMPH NODE (0/1)   06/06/2017 Initial Diagnosis   Malignant neoplasm of lower-outer quadrant of left breast of female, estrogen receptor negative ([place])   06/06/2017 Receptors her2   Estrogen Receptor: 0%, NEGATIVE Progesterone Receptor: 0%, NEGATIVE Proliferation Marker Ki67: 70%    06/26/2017 Surgery   LEFT BREAST LUMPECTOMY WITH RADIOACTIVE SEED AND SENTINEL LYMPH NODE BIOPSY ERAS  PATHWAY AND INSERTION PORT-A-CATH By Dr. Barry Dienes on 06/26/17    06/26/2017 Pathology Results   Diagnosis 06/26/17  1. Breast, lumpectomy, Left - INVASIVE DUCTAL CARCINOMA, GRADE III/III, SPANNING 1.2 CM. - THE SURGICAL RESECTION MARGINS ARE NEGATIVE FOR CARCINOMA. - SEE ONCOLOGY TABLE BELOW. 2. Lymph node, sentinel, biopsy, Left axillary #1 - THERE IS NO EVIDENCE OF CARCINOMA IN 1 OF 1 LYMPH NODE (0/1). 3. Lymph node, sentinel, biopsy, Left axillary #2 - THERE IS NO EVIDENCE OF CARCINOMA IN 1 OF 1 LYMPH NODE (0/1). 4. Lymph node, sentinel, biopsy, Left axillary #3 - THERE IS NO EVIDENCE OF CARCINOMA IN 1 OF 1 LYMPH NODE (0/1).    07/25/2017 - 09/29/2017 Chemotherapy   Adjuvant cytoxan and docetaxel (TC) every 3 weekd for 4 cycles     11/19/2017 - 12/17/2017 Radiation Therapy   Adjuvant breast radiation Left breast treated to 42.5 Gy with 17 fx of 2.5 Gy followed by a boost of 7.5 Gy with 3 fx of 2.5 Gy      02/2019 Procedure   She had PAC removal in 02/2019.    07/23/2020 Imaging   MRI Lumbar Spine 07/23/20 IMPRESSION: 1. No significant disc herniation, spinal canal or neural foraminal stenosis at any level. 2. Moderate facet degenerative changes at L3-4, L4-5 and L5-S1. 3. Multiple enlarged retroperitoneal and right iliac lymph nodes. Recommend correlation with CT of the abdomen and pelvis with contrast.   08/15/2020 Imaging   CT AP 08/15/20  IMPRESSION: 1. Right iliac and periaortic adenopathy is noted concerning for metastatic disease or lymphoma. Also noted is abnormal soft tissue mass anterior and posterior  to the right iliac wing concerning for malignancy or metastatic disease. MRI is recommended for further evaluation. 2. Irregular lucency with sclerotic margins is seen involving the right iliac crest concerning for possible lytic lesion. MRI may be performed for  further evaluation. 3. Enlarged fibroid uterus. 4. Small gallstone. 5. Aortic atherosclerosis.   08/22/2020 Pathology Results   FINAL MICROSCOPIC DIAGNOSIS: 08/22/20  A. NEEDLE CORE, RIGHT GLUTEUS MINIMUMUS, BIOPSY:  -  Metastatic carcinoma  -  See comment   COMMENT:   By immunohistochemistry, the neoplastic cells are positive for  cytokeratin-7 and have weak patchy positivity for GATA3 but are negative  for cytokeratin-20 and GCDFP.  The combined morphology and  immunophenotype, support metastasis from the patient's known breast  primary carcinoma.  Dr. Burr Medico was notified of these results on 08/24/2020.  Prognostic markers (ER, PR, Her2, PDL-1) are pending and will be  reported in an addendum   ADDENDUM:   PROGNOSTIC INDICATOR RESULTS:   Immunohistochemical and morphometric analysis performed manually   The tumor cells are NEGATIVE for Her2 (0).   Estrogen Receptor:       POSITIVE, 20%, WEAK STAINING  Progesterone Receptor:   NEGATIVE    PD-L1 (0) negative    09/04/2020 - 09/22/2020 Radiation Therapy   Palliative radiation to right iliac 09/04/20 - 09/22/20, Dr. Lisbeth Renshaw   09/26/2020 -  Chemotherapy   first line weekly Taxol 3 weeks on/1 week off starting 09/26/20 --Reduced to 2 weeks on/1 week off starting with C3 due to neuropathy. Stopped after C4 on 12/18/20 due to neuratrophy.  --Switched to Xeloda on 01/01/21 at $RemoveBe'1500mg'fUSGvrSSR$  in the AM and $Remo'2000mg'OUfGc$  in the PM for 2 weeks on/1 week off     10/13/2020 Procedure    PAC placement on 10/13/20.       CURRENT THERAPY:  -Due to Neuropathy from first line Taxol, I switched to Xeloda on 01/01/21 at $RemoveBe'1500mg'WSdiRDkod$  in the AM and $Remo'2000mg'tSRwK$  in the PM for 2 weeks on/1 week off. Increased to full dose $RemoveB'2000mg'uVCljNRl$  BID with C2 on 01/22/21.  -Zometa q19months starting 11/20/20   INTERVAL HISTORY:  Kristin Ward is here for a follow up of metastatic breast cancer. She was last seen by me on 02/27/21, with telephone f/u on 03/02/21 in the interim. She presents to the  clinic alone. She notes she was in the hospital while she was visiting [place]. She presented with urinating, pain to her left mid back. She reports she was told she was passing kidney stones. She brought a disc to me today with images from her CT scan. She reports she had similar symptoms to several weeks ago (which she reports this corresponded to when she finished Xeloda). She notes she develops diarrhea, nausea, and vomiting in her off week of Xeloda. She notes she began this cycle of Xeloda yesterday, 03/25/21. She notes some twinges of pain and numbness to her feet when she is on it. She notes swelling in her right foot.   All other systems were reviewed with the patient and are negative.  MEDICAL HISTORY:  Past Medical History:  Diagnosis Date   Abnormal x-ray of lungs with single pulmonary nodule 03/15/2016   per records from [place]    Anemia    Asthma    Cholelithiasis 05/19/2017   On CT   Coronary artery calcification seen on CAT scan 05/19/2017   Diabetes mellitus without complication ([place])    type 2   Dilated idiopathic cardiomyopathy ([place]) 12/2015   EF 15-20%. Diagnosed  in [facility]   Headache    NONE RECENT   Hypertension    Lymph nodes enlarged 07/25/2020   Malignant neoplasm of lower-outer quadrant of left breast of female, estrogen receptor negative ([place]) 06/11/2017   left breast   Mixed hyperlipidemia 06/17/2018   partially blind    Personal history of chemotherapy 2018-2019   Personal history of radiation therapy 2019   Retinitis pigmentosa    Retroperitoneal lymphadenopathy 07/25/2020   Uveitis     SURGICAL HISTORY: Past Surgical History:  Procedure Laterality Date   brain cyst removed  2007   to help with headaches per patient , aspirated    BREAST BIOPSY Left 06/06/2017   x2   BREAST CYST ASPIRATION Left 06/06/2017   BREAST LUMPECTOMY Left 06/26/2017   BREAST LUMPECTOMY WITH RADIOACTIVE SEED AND SENTINEL LYMPH NODE BIOPSY Left 06/26/2017    Procedure: BREAST LUMPECTOMY WITH RADIOACTIVE SEED AND SENTINEL LYMPH NODE BIOPSY ERAS  PATHWAY;  Surgeon: [name], MD;  Location: [place];  Service: General;  Laterality: Left;  pec block   FINE NEEDLE ASPIRATION Left 02/23/2019   Procedure: FINE NEEDLE ASPIRATION;  Surgeon: [name], MD;  Location: [facility];  Service: General;  Laterality: Left;  Asiration of left breast seroma    IR IMAGING GUIDED PORT INSERTION  10/13/2020   NASAL TURBINATE REDUCTION     PORT-A-CATH REMOVAL Right 02/23/2019   Procedure: REMOVAL PORT-A-CATH;  Surgeon: [name], MD;  Location: [facility];  Service: General;  Laterality: Right;   PORTACATH PLACEMENT N/A 06/26/2017   Procedure: INSERTION PORT-A-CATH;  Surgeon: [name], MD;  Location: [place];  Service: General;  Laterality: N/A;   TRANSTHORACIC ECHOCARDIOGRAM  12/2015   A) [facility] May 2017: Mild concentric LVH. Global hypokinesis. GR daily. EF 18% severe LA dilation. Mitral annular dilatation with papillary muscle dysfunction and moderate MR. Dilated IVC consistent with elevated RAP.   TRANSTHORACIC ECHOCARDIOGRAM  05/2017; 08/2017   a) Mild concentric LVH.  EF 45 to 50% with diffuse hypokinesis.  GR 1 DD.  Mild aortic root dilation.;; b)  Mild LVH EF 45 to 50%.  Diffuse HK.  No significant valve disease.  No significant change.    I have reviewed the social history and family history with the patient and they are unchanged from previous note.  ALLERGIES:  is allergic to morphine and related, latex, tylenol with codeine #3 [acetaminophen-codeine], and penicillins.  MEDICATIONS:  Current Outpatient Medications  Medication Sig Dispense Refill   Accu-Chek FastClix Lancets MISC TEST TWICE A DAY. PT USES AN ACCU-CHEK GUIDE ME METER 102 each 2   aspirin 81 MG chewable tablet Chew 81 mg by mouth daily.     capecitabine (XELODA) 500 MG tablet Take 4 tabs in morning and 4 tabs in evening, every 12hrs, for  14 days then off for 7 days. Repeat every 21 days. Take within 30 minutes after meals. 112 tablet 1   carvedilol (COREG) 12.5 MG tablet Take 1 tablet (12.5 mg total) by mouth 2 (two) times daily with a meal. 180 tablet 1   Cholecalciferol (D3-1000 PO) Take by mouth.     Continuous Blood Gluc Sensor (FREESTYLE LIBRE 14 DAY SENSOR) MISC 1 each by Does not apply route every 14 (fourteen) days. 3 each 4   diclofenac (VOLTAREN) 75 MG EC tablet Take 1 tablet (75 mg total) by mouth 2 (two) times daily. 60 tablet 1   furosemide (LASIX) 20 MG tablet Take 20 mg  by mouth daily. Patient is unsure of dose     glucose blood (ACCU-CHEK GUIDE) test strip Use as instructed to test blood sugar 2 times a day 100 strip 2   ibuprofen (ADVIL) 800 MG tablet Take 1 tablet (800 mg total) by mouth every 8 (eight) hours as needed. 30 tablet 2   lidocaine-prilocaine (EMLA) cream Apply 1 application topically as needed. 30 g 0   linaclotide (LINZESS) 72 MCG capsule Take 1 capsule (72 mcg total) by mouth daily before breakfast. 30 capsule 1   metFORMIN (GLUCOPHAGE) 500 MG tablet Take 500 mg by mouth 2 (two) times daily with a meal.     Multiple Vitamins-Minerals (WOMENS MULTI PO) Take by mouth.     prochlorperazine (COMPAZINE) 10 MG tablet Take 1 tablet (10 mg total) by mouth every 6 (six) hours as needed (Nausea or vomiting). 30 tablet 1   Saccharomyces boulardii (PROBIOTIC) 250 MG CAPS Take by mouth. (Patient not taking: Reported on 03/08/2021)     sacubitril-valsartan (ENTRESTO) 97-103 MG Take 1 tablet by mouth 2 (two) times daily. 180 tablet 3   Semaglutide (RYBELSUS) 3 MG TABS Take by mouth.     spironolactone (ALDACTONE) 25 MG tablet Take 1 tablet (25 mg total) by mouth daily. 90 tablet 3   vitamin B-12 (CYANOCOBALAMIN) 500 MCG tablet Take 500 mcg by mouth daily.     vitamin C (ASCORBIC ACID) 500 MG tablet Take 1,000 mg 2 (two) times daily by mouth.     No current facility-administered medications for this visit.     PHYSICAL EXAMINATION: ECOG PERFORMANCE STATUS: 1 - Symptomatic but completely ambulatory  Vitals:   03/26/21 1030  BP: 139/90  Pulse: 80  Resp: 18  Temp: 98.4 F (36.9 C)  SpO2: 100%   Wt Readings from Last 3 Encounters:  03/26/21 220 lb 8 oz (100 kg)  03/08/21 217 lb (98.4 kg)  02/27/21 214 lb 9.6 oz (97.3 kg)     GENERAL:alert, no distress and comfortable SKIN: skin color normal, no rashes or significant lesions EYES: normal, Conjunctiva are pink and non-injected, sclera clear  NEURO: alert & oriented x 3 with fluent speech  LABORATORY DATA:  I have reviewed the data as listed CBC Latest Ref Rng & Units 03/26/2021 02/27/2021 01/29/2021  WBC 4.0 - 10.5 K/uL 4.9 5.7 7.0  Hemoglobin 12.0 - 15.0 g/dL 11.3(L) 12.5 11.9(L)  Hematocrit 36.0 - 46.0 % 33.7(L) 36.8 35.3(L)  Platelets 150 - 400 K/uL 243 334 239     CMP Latest Ref Rng & Units 03/26/2021 02/27/2021 01/29/2021  Glucose 70 - 99 mg/dL 157(H) 237(H) 288(H)  BUN 8 - 23 mg/dL $Remove'13 13 14  'akqoZlK$ Creatinine 0.44 - 1.00 mg/dL 0.84 0.97 0.74  Sodium 135 - 145 mmol/L 142 141 136  Potassium 3.5 - 5.1 mmol/L 4.2 3.8 4.2  Chloride 98 - 111 mmol/L 108 106 105  CO2 22 - 32 mmol/L $RemoveB'25 25 26  'fBbbroqW$ Calcium 8.9 - 10.3 mg/dL 9.3 10.5(H) 9.5  Total Protein 6.5 - 8.1 g/dL 7.3 8.3(H) 7.8  Total Bilirubin 0.3 - 1.2 mg/dL 0.6 0.8 0.5  Alkaline Phos 38 - 126 U/L 92 112 98  AST 15 - 41 U/L 23 14(L) 17  ALT 0 - 44 U/L $Remo'21 8 16      'fcVmn$ RADIOGRAPHIC STUDIES: I have personally reviewed the radiological images as listed and agreed with the findings in the report. No results found.   ASSESSMENT & PLAN:  Shanine Kreiger is a 65 y.o. female  with   1. Left breast invasive ductal carcinoma, stage IB, p(T1cN0M0), Triple negative, Grade 3, in 2018, metastatic disease to right gluteal mass/iliac and diffuse node metastasis in 08/2020 ER 20% weakly + PR/HER2 (0) negative, PD-L1 0% -She was initially diagnosed in 05/2017. She has high risk for recurrence due  to triple negative disease. She is s/p left breast lumpectomy, adjuvant chemo TC and Radiation.  -Genetic testing was negative for pathogenetic mutations.  -Due to recent right gluteal pain, she underwent biopsy on 09/02/20 which showed metastatic carcinoma, consistent with breast primary. ER 20% positive, ER and HER2 negative.  -I discussed with stage IV metastatic breast cancer, her cancer is no longer curable but still treatable. Goal of therapy is palliative to control her disease and prolong her life.  -I started her on first-line chemotherapy with weekly Taxol on 09/26/20 for 3 weeks on/ 1 week off. I discussed if she does well on chemo for 4-6 months, we can change treatment to antiestrogen therapy without chemo, given her ER weekly positive diease. Due to worsened neuropathy, Taxol stopped after C4 on 12/18/20. I switched her to Xeloda on 01/01/21 at $RemoveBe'1500mg'UblGBIJMQ$  in the AM and $Remo'2000mg'yVAXc$  in the PM 2 weeks on/1 week off.  -Her CT CAP from 01/17/21 showed: overall improved disease and interval decrease in size of metastatic adenopathy and mixed lytic and sclerotic bone mets; borderline enlarged right inguinal LN which is new since last scan, possible reactive.  -We increased to full dose Xeloda, 2000 mg BID, on 01/22/21. She is tolerating well with mild nausea and diarrhea on off week. Will continue, most recent cycle was started yesterday, 03/25/21. -plan for restaging CT with contrast before next visit.   2.  UTI, h/o kidney stones -She clinically responded to Cipro -Urine culture showed multiple species -was noted to be passing kidney stones in early 03/2021. She provided me with a disc of her imaging (done in [place]).   3. Type 2 DM -She recently tried insulin, but it made her sick, and she has stopped. -She is on oral diabetic medicine, she is checking her sugars at home. -She is now being followed by Dr. Redmond School.   4. G2 peripheral Neuropathy  -S/p C2D8 Taxol she developed mild tingling in her hands and  feet.  -She uses ice bags at home as needed.  -She continues to have moderate numbness in her hands and light tingling in her feet. She can take B12 or acupuncture.  -Taxol stopped after 12/18/20. Her neuropathy is improving.  -Overall stable   5. HTN, COPD, retinitis pigmentosa with limited vision, Chronic combined systolic and diastolic heart failure, EF 45-50% -Continue to follow-up with PCP Dr Redmond School and cardiologist Dr Ellyn Hack    6. Goal of care discussion  -The patient understands the goal of care is palliative. -she is full code now      Plan -continue Xeloda (started 03/25/21) at $RemoveBef'2000mg'XhhXocubLe$  q12h for 14 days  -schedule CT CAP w/ contrast around 04/09/21 -labs and f/u on 04/11/21    No problem-specific Assessment & Plan notes found for this encounter.   No orders of the defined types were placed in this encounter.  All questions were answered. The patient knows to call the clinic with any problems, questions or concerns. No barriers to learning was detected. The total time spent in the appointment was 30 minutes.     [name], MD 03/26/2021   I, Wilburn Mylar, am acting as scribe for [name], MD.   I  have reviewed the above documentation for accuracy and completeness, and I agree with the above.

## 2021-03-27 ENCOUNTER — Other Ambulatory Visit: Payer: Self-pay | Admitting: Nurse Practitioner

## 2021-03-27 ENCOUNTER — Other Ambulatory Visit: Payer: Self-pay | Admitting: Family Medicine

## 2021-03-27 ENCOUNTER — Other Ambulatory Visit (HOSPITAL_COMMUNITY): Payer: Self-pay

## 2021-03-27 ENCOUNTER — Other Ambulatory Visit: Payer: Self-pay | Admitting: Cardiology

## 2021-03-27 DIAGNOSIS — E119 Type 2 diabetes mellitus without complications: Secondary | ICD-10-CM

## 2021-03-27 LAB — CANCER ANTIGEN 27.29: CA 27.29: 46.1 U/mL — ABNORMAL HIGH (ref 0.0–38.6)

## 2021-03-27 NOTE — Telephone Encounter (Signed)
Looks like she is on '500mg'$  now

## 2021-03-29 ENCOUNTER — Telehealth: Payer: Self-pay

## 2021-03-29 ENCOUNTER — Other Ambulatory Visit: Payer: Self-pay | Admitting: Family Medicine

## 2021-03-29 ENCOUNTER — Other Ambulatory Visit (HOSPITAL_COMMUNITY): Payer: Self-pay

## 2021-03-29 DIAGNOSIS — E119 Type 2 diabetes mellitus without complications: Secondary | ICD-10-CM

## 2021-03-29 MED ORDER — METFORMIN HCL 500 MG PO TABS: 500.0000 mg | ORAL_TABLET | Freq: Two times a day (BID) | ORAL | 1 refills | Status: DC

## 2021-03-29 NOTE — Telephone Encounter (Signed)
Pt states was getting Metformin '500mg'$  BID rx'd by endocrinologist but no longer seeing endo, needs refill Metformin to [facility]

## 2021-04-06 ENCOUNTER — Telehealth: Payer: Self-pay | Admitting: Hematology

## 2021-04-06 ENCOUNTER — Other Ambulatory Visit: Payer: Self-pay

## 2021-04-06 DIAGNOSIS — C50512 Malignant neoplasm of lower-outer quadrant of left female breast: Secondary | ICD-10-CM

## 2021-04-06 NOTE — Progress Notes (Signed)
Orders only encounter

## 2021-04-09 ENCOUNTER — Inpatient Hospital Stay: Payer: Medicare Other

## 2021-04-10 ENCOUNTER — Ambulatory Visit (HOSPITAL_COMMUNITY)
Admission: RE | Admit: 2021-04-10 | Discharge: 2021-04-10 | Disposition: A | Discharge status: Home / Self Care | Payer: Medicare Other | Source: Ambulatory Visit | Attending: Hematology | Admitting: Hematology

## 2021-04-10 ENCOUNTER — Other Ambulatory Visit: Payer: Self-pay

## 2021-04-10 ENCOUNTER — Encounter (HOSPITAL_COMMUNITY): Payer: Self-pay

## 2021-04-10 DIAGNOSIS — C50512 Malignant neoplasm of lower-outer quadrant of left female breast: Secondary | ICD-10-CM | POA: Insufficient documentation

## 2021-04-10 DIAGNOSIS — C7951 Secondary malignant neoplasm of bone: Secondary | ICD-10-CM | POA: Diagnosis not present

## 2021-04-10 DIAGNOSIS — C50912 Malignant neoplasm of unspecified site of left female breast: Secondary | ICD-10-CM | POA: Diagnosis not present

## 2021-04-10 DIAGNOSIS — K8020 Calculus of gallbladder without cholecystitis without obstruction: Secondary | ICD-10-CM | POA: Diagnosis not present

## 2021-04-10 DIAGNOSIS — J9811 Atelectasis: Secondary | ICD-10-CM | POA: Diagnosis not present

## 2021-04-10 DIAGNOSIS — Z171 Estrogen receptor negative status [ER-]: Secondary | ICD-10-CM | POA: Insufficient documentation

## 2021-04-10 DIAGNOSIS — J984 Other disorders of lung: Secondary | ICD-10-CM | POA: Diagnosis not present

## 2021-04-10 DIAGNOSIS — K4090 Unilateral inguinal hernia, without obstruction or gangrene, not specified as recurrent: Secondary | ICD-10-CM | POA: Diagnosis not present

## 2021-04-10 MED ORDER — HEPARIN SOD (PORK) LOCK FLUSH 100 UNIT/ML IV SOLN
INTRAVENOUS | Status: AC
  Filled 2021-04-10: qty 5

## 2021-04-10 MED ORDER — HEPARIN SOD (PORK) LOCK FLUSH 100 UNIT/ML IV SOLN
500.0000 [IU] | Freq: Once | INTRAVENOUS | Status: AC
  Administered 2021-04-10: 500 [IU] via INTRAVENOUS

## 2021-04-10 MED ORDER — IOHEXOL 350 MG/ML SOLN
80.0000 mL | Freq: Once | INTRAVENOUS | Status: AC | PRN
  Administered 2021-04-10: 80 mL via INTRAVENOUS

## 2021-04-11 ENCOUNTER — Inpatient Hospital Stay: Payer: Medicare Other | Admitting: Nurse Practitioner

## 2021-04-16 ENCOUNTER — Other Ambulatory Visit: Payer: Self-pay | Admitting: Hematology

## 2021-04-16 NOTE — Progress Notes (Signed)
DISCONTINUE ON PATHWAY REGIMEN - Breast     A cycle is every 28 days (3 weeks on and 1 week off):     Paclitaxel   **Always confirm dose/schedule in your pharmacy ordering system**  REASON: Toxicities / Adverse Event PRIOR TREATMENT: [id number]: Paclitaxel 80 mg/m2 D1, 8, 15 q28 Days TREATMENT RESPONSE: Partial Response (PR)  START ON PATHWAY REGIMEN - Breast     A cycle is every 21 days:     Eribulin mesylate   **Always confirm dose/schedule in your pharmacy ordering system**  Patient Characteristics: Distant Metastases or Locoregional Recurrent Disease - Unresected or Locally Advanced Unresectable Disease Progressing after Neoadjuvant and Local Therapies, HER2 Negative/Unknown/Equivocal, ER Negative/Unknown, Chemotherapy, Third Line and Beyond,  Eribulin Candidate Therapeutic Status: Distant Metastases ER Status: Negative (-) HER2 Status: Negative (-) PR Status: Negative (-) Therapy Approach Indicated: Standard Chemotherapy/Endocrine Therapy Line of Therapy: Third Line and Beyond Intent of Therapy: Non-Curative / Palliative Intent, Discussed with Patient

## 2021-04-17 ENCOUNTER — Encounter: Payer: Self-pay | Admitting: Hematology

## 2021-04-17 ENCOUNTER — Other Ambulatory Visit (HOSPITAL_COMMUNITY): Payer: Self-pay

## 2021-04-17 ENCOUNTER — Other Ambulatory Visit: Payer: Self-pay

## 2021-04-17 ENCOUNTER — Inpatient Hospital Stay: Payer: Medicare Other

## 2021-04-17 ENCOUNTER — Inpatient Hospital Stay: Payer: Medicare Other | Attending: Hematology | Admitting: Hematology

## 2021-04-17 VITALS — BP 125/82 | HR 74 | Temp 98.2°F | Resp 18 | Ht 67.0 in | Wt 216.7 lb

## 2021-04-17 DIAGNOSIS — I700 Atherosclerosis of aorta: Secondary | ICD-10-CM | POA: Insufficient documentation

## 2021-04-17 DIAGNOSIS — C7951 Secondary malignant neoplasm of bone: Secondary | ICD-10-CM | POA: Insufficient documentation

## 2021-04-17 DIAGNOSIS — J449 Chronic obstructive pulmonary disease, unspecified: Secondary | ICD-10-CM | POA: Insufficient documentation

## 2021-04-17 DIAGNOSIS — H548 Legal blindness, as defined in USA: Secondary | ICD-10-CM | POA: Diagnosis not present

## 2021-04-17 DIAGNOSIS — T451X5A Adverse effect of antineoplastic and immunosuppressive drugs, initial encounter: Secondary | ICD-10-CM | POA: Diagnosis not present

## 2021-04-17 DIAGNOSIS — M25551 Pain in right hip: Secondary | ICD-10-CM | POA: Insufficient documentation

## 2021-04-17 DIAGNOSIS — G620 Drug-induced polyneuropathy: Secondary | ICD-10-CM | POA: Insufficient documentation

## 2021-04-17 DIAGNOSIS — E119 Type 2 diabetes mellitus without complications: Secondary | ICD-10-CM | POA: Diagnosis not present

## 2021-04-17 DIAGNOSIS — R160 Hepatomegaly, not elsewhere classified: Secondary | ICD-10-CM | POA: Diagnosis not present

## 2021-04-17 DIAGNOSIS — C50512 Malignant neoplasm of lower-outer quadrant of left female breast: Secondary | ICD-10-CM | POA: Diagnosis not present

## 2021-04-17 DIAGNOSIS — E559 Vitamin D deficiency, unspecified: Secondary | ICD-10-CM | POA: Diagnosis not present

## 2021-04-17 DIAGNOSIS — Z87442 Personal history of urinary calculi: Secondary | ICD-10-CM | POA: Insufficient documentation

## 2021-04-17 DIAGNOSIS — C774 Secondary and unspecified malignant neoplasm of inguinal and lower limb lymph nodes: Secondary | ICD-10-CM | POA: Diagnosis not present

## 2021-04-17 DIAGNOSIS — Z79899 Other long term (current) drug therapy: Secondary | ICD-10-CM | POA: Diagnosis not present

## 2021-04-17 DIAGNOSIS — K8020 Calculus of gallbladder without cholecystitis without obstruction: Secondary | ICD-10-CM | POA: Insufficient documentation

## 2021-04-17 DIAGNOSIS — Z880 Allergy status to penicillin: Secondary | ICD-10-CM | POA: Insufficient documentation

## 2021-04-17 DIAGNOSIS — I2510 Atherosclerotic heart disease of native coronary artery without angina pectoris: Secondary | ICD-10-CM | POA: Diagnosis not present

## 2021-04-17 DIAGNOSIS — N390 Urinary tract infection, site not specified: Secondary | ICD-10-CM | POA: Insufficient documentation

## 2021-04-17 DIAGNOSIS — Z171 Estrogen receptor negative status [ER-]: Secondary | ICD-10-CM

## 2021-04-17 DIAGNOSIS — D259 Leiomyoma of uterus, unspecified: Secondary | ICD-10-CM | POA: Insufficient documentation

## 2021-04-17 DIAGNOSIS — E785 Hyperlipidemia, unspecified: Secondary | ICD-10-CM | POA: Diagnosis not present

## 2021-04-17 DIAGNOSIS — M47817 Spondylosis without myelopathy or radiculopathy, lumbosacral region: Secondary | ICD-10-CM | POA: Insufficient documentation

## 2021-04-17 DIAGNOSIS — H3552 Pigmentary retinal dystrophy: Secondary | ICD-10-CM | POA: Insufficient documentation

## 2021-04-17 DIAGNOSIS — I5042 Chronic combined systolic (congestive) and diastolic (congestive) heart failure: Secondary | ICD-10-CM | POA: Diagnosis not present

## 2021-04-17 DIAGNOSIS — Z87440 Personal history of urinary (tract) infections: Secondary | ICD-10-CM | POA: Insufficient documentation

## 2021-04-17 DIAGNOSIS — I110 Hypertensive heart disease with heart failure: Secondary | ICD-10-CM | POA: Diagnosis not present

## 2021-04-17 DIAGNOSIS — Z95828 Presence of other vascular implants and grafts: Secondary | ICD-10-CM

## 2021-04-17 DIAGNOSIS — M47816 Spondylosis without myelopathy or radiculopathy, lumbar region: Secondary | ICD-10-CM | POA: Diagnosis not present

## 2021-04-17 DIAGNOSIS — Z923 Personal history of irradiation: Secondary | ICD-10-CM | POA: Insufficient documentation

## 2021-04-17 DIAGNOSIS — Z9221 Personal history of antineoplastic chemotherapy: Secondary | ICD-10-CM | POA: Insufficient documentation

## 2021-04-17 DIAGNOSIS — Z886 Allergy status to analgesic agent status: Secondary | ICD-10-CM | POA: Insufficient documentation

## 2021-04-17 DIAGNOSIS — Z885 Allergy status to narcotic agent status: Secondary | ICD-10-CM | POA: Insufficient documentation

## 2021-04-17 LAB — CBC WITH DIFFERENTIAL/PLATELET
Abs Immature Granulocytes: 0.02 10*3/uL (ref 0.00–0.07)
Basophils Absolute: 0 10*3/uL (ref 0.0–0.1)
Basophils Relative: 0 %
Eosinophils Absolute: 0.3 10*3/uL (ref 0.0–0.5)
Eosinophils Relative: 6 %
HCT: 35 % — ABNORMAL LOW (ref 36.0–46.0)
Hemoglobin: 11.8 g/dL — ABNORMAL LOW (ref 12.0–15.0)
Immature Granulocytes: 0 %
Lymphocytes Relative: 15 %
Lymphs Abs: 0.8 10*3/uL (ref 0.7–4.0)
MCH: 32.3 pg (ref 26.0–34.0)
MCHC: 33.7 g/dL (ref 30.0–36.0)
MCV: 95.9 fL (ref 80.0–100.0)
Monocytes Absolute: 0.5 10*3/uL (ref 0.1–1.0)
Monocytes Relative: 9 %
Neutro Abs: 3.8 10*3/uL (ref 1.7–7.7)
Neutrophils Relative %: 70 %
Platelets: 269 10*3/uL (ref 150–400)
RBC: 3.65 MIL/uL — ABNORMAL LOW (ref 3.87–5.11)
RDW: 15.4 % (ref 11.5–15.5)
WBC: 5.4 10*3/uL (ref 4.0–10.5)
nRBC: 0 % (ref 0.0–0.2)

## 2021-04-17 LAB — COMPREHENSIVE METABOLIC PANEL
ALT: 16 U/L (ref 0–44)
AST: 20 U/L (ref 15–41)
Albumin: 4 g/dL (ref 3.5–5.0)
Alkaline Phosphatase: 96 U/L (ref 38–126)
Anion gap: 9 (ref 5–15)
BUN: 11 mg/dL (ref 8–23)
CO2: 23 mmol/L (ref 22–32)
Calcium: 9.6 mg/dL (ref 8.9–10.3)
Chloride: 109 mmol/L (ref 98–111)
Creatinine, Ser: 0.84 mg/dL (ref 0.44–1.00)
GFR, Estimated: >60 mL/min (ref >60)
Glucose, Bld: 139 mg/dL — ABNORMAL HIGH (ref 70–99)
Potassium: 4.1 mmol/L (ref 3.5–5.1)
Sodium: 141 mmol/L (ref 135–145)
Total Bilirubin: 0.7 mg/dL (ref 0.3–1.2)
Total Protein: 7.8 g/dL (ref 6.5–8.1)

## 2021-04-17 LAB — HEMOGLOBIN A1C
Hgb A1c MFr Bld: 8.6 % — ABNORMAL HIGH (ref 4.8–5.6)
Mean Plasma Glucose: 200.12 mg/dL

## 2021-04-17 LAB — VITAMIN D 25 HYDROXY (VIT D DEFICIENCY, FRACTURES): Vit D, 25-Hydroxy: 27.18 ng/mL — ABNORMAL LOW (ref 30–100)

## 2021-04-17 MED ORDER — HEPARIN SOD (PORK) LOCK FLUSH 100 UNIT/ML IV SOLN
500.0000 [IU] | Freq: Once | INTRAVENOUS | Status: AC | PRN
  Administered 2021-04-17: 500 [IU]

## 2021-04-17 MED ORDER — CAPECITABINE 500 MG PO TABS
ORAL_TABLET | ORAL | 2 refills | Status: DC
  Filled 2021-04-17: qty 112, 21d supply, fill #0
  Filled 2021-05-21: qty 112, 21d supply, fill #1

## 2021-04-17 MED ORDER — ALTEPLASE 2 MG IJ SOLR: 2.0000 mg | Freq: Once | INTRAMUSCULAR | Status: DC | PRN

## 2021-04-17 MED ORDER — SODIUM CHLORIDE 0.9% FLUSH
10.0000 mL | INTRAVENOUS | Status: DC | PRN
  Administered 2021-04-17: 10 mL via INTRAVENOUS

## 2021-04-17 MED ORDER — HEPARIN SOD (PORK) LOCK FLUSH 100 UNIT/ML IV SOLN: 500.0000 [IU] | Freq: Once | INTRAVENOUS | Status: DC | PRN

## 2021-04-17 MED ORDER — ZOLEDRONIC ACID 4 MG/100ML IV SOLN: 4.0000 mg | Freq: Once | INTRAVENOUS | Status: DC

## 2021-04-17 MED ORDER — SODIUM CHLORIDE 0.9% FLUSH
10.0000 mL | Freq: Once | INTRAVENOUS | Status: DC | PRN
Start: 2021-04-17 — End: 2021-04-17

## 2021-04-17 MED ORDER — SODIUM CHLORIDE 0.9 % IV SOLN: Freq: Once | INTRAVENOUS | Status: DC

## 2021-04-17 MED ORDER — HEPARIN SOD (PORK) LOCK FLUSH 100 UNIT/ML IV SOLN: 250.0000 [IU] | Freq: Once | INTRAVENOUS | Status: DC | PRN

## 2021-04-17 MED ORDER — SODIUM CHLORIDE 0.9% FLUSH: 3.0000 mL | Freq: Once | INTRAVENOUS | Status: DC | PRN

## 2021-04-17 NOTE — Progress Notes (Signed)
[place]   [phone] [phone]   Clinic Follow up Note   Patient Care Team: Girtha Rm, NP-C as PCP - General (Family Medicine) [name], MD as PCP - Cardiology (Cardiology) [name], MD as Consulting Physician (Hematology) [name], MD as Consulting Physician (General Surgery)  Date of Service:  04/17/2021  CHIEF COMPLAINT: f/u of metastatic breast cancer  CURRENT THERAPY:  -Due to Neuropathy from first line Taxol, I switched to Xeloda on 01/01/21 at 1579m in the AM and 20075min the PM for 2 weeks on/1 week off. Increased to full dose 200037mID with C2 on 01/22/21.  -Zometa q3mo71montharting 11/20/20   ASSESSMENT & PLAN:  Kristin Ward 64 y79. female with   1. Left breast invasive ductal carcinoma, stage IB, p(T1cN0M0), Triple negative, Grade 3, in 2018, metastatic disease to right gluteal mass/iliac and diffuse node metastasis in 08/2020 ER 20% weakly + PR/HER2 (0) negative, PD-L1 0% -She was initially diagnosed in 05/2017. She has high risk for recurrence due to triple negative disease. She is s/p left breast lumpectomy, adjuvant chemo TC and Radiation.  -Genetic testing was negative for pathogenetic mutations.  -Due to recent right gluteal pain, she underwent biopsy on 09/02/20 which showed metastatic carcinoma, consistent with breast primary. ER 20% positive, ER and HER2 negative.  -She weekly Taxol on 09/26/20 for 3 weeks on/ 1 week off. I discussed if she does well on chemo for 4-6 months, we can change treatment to antiestrogen therapy without chemo, given her ER weekly positive diease. Due to worsened neuropathy, Taxol stopped after C4 on 12/18/20. I switched her to Xeloda on 01/01/21 at 1500mg36mthe AM and 2000mg 45mhe PM 2 weeks on/1 week off.  -Her CT CAP from 01/17/21 showed: overall improved disease and interval decrease in size of metastatic adenopathy and mixed lytic and sclerotic bone mets; borderline  enlarged right inguinal LN which is new since last scan, possible reactive.  -We increased to full dose Xeloda, 2000 mg BID, on 01/22/21.  -Restaging CT CAP 04/10/21 showed slightly enlarging lymphadenopathy in retroperitoneal and right inguinal. I reviewed the images with her today. No other new lesions. She is clinically doing well, will continue Xeloda for now and repeat PET in 2 months for close f/u     2.  UTI, h/o kidney stones -She clinically responded to Cipro -Urine culture showed multiple species -was noted to be passing kidney stones in early 03/2021. She provided me with a disc of her imaging (done in [place]). -she notes continued UTI symptoms. I recommended she stop her herbal supplements to see if it helps.  3. Right hip pain, Right iliac bone met, Vit D deficiency -She had radiating right hip pain since 2020 from right iliac wing mass -She completed Palliative radiation to right iliac 09/04/20 - 09/22/20 with Dr. Moody,Lisbeth Renshawher pain is much improved.  -Given localized bone met, I recommend Zometa q3month78monthrengthen her bones and treatment for her bone mets. She received one dose on 11/20/20. I recommend we restart this. -I checked her vit D today, results are pending.   4. G2 peripheral Neuropathy  -S/p C2D8 Taxol she developed mild tingling in her hands and feet.  -She uses ice bags at home as needed.  -She continues to have moderate numbness in her hands and light tingling in her feet. She can take B12 or acupuncture.  -Taxol stopped after 12/18/20. Her neuropathy is improving.  -  Overall stable   5. DM, HTN, COPD, retinitis pigmentosa with limited vision, Chronic combined systolic and diastolic heart failure, EF 45-50% -Continue to follow-up with PCP Dr Redmond School and cardiologist Dr Ellyn Hack  -she is legally blind -She recently tried insulin, but it made her sick, and she has stopped. She is on oral diabetic medicine, she is checking her sugars at home. BG overall improved since  starting medication.   6. Goal of care discussion  -The patient understands the goal of care is palliative. -she is full code now      Plan -continue Xeloda at 206m q12h for 14 days, I refilled for her, she will start later this week when she receives refill  -labs and f/u in 3 weeks on 05/07/21   No problem-specific Assessment & Plan notes found for this encounter.   SUMMARY OF ONCOLOGIC HISTORY: Oncology History Overview Note  Cancer Staging Malignant neoplasm of lower-outer quadrant of left breast of female, estrogen receptor negative ([place] Staging form: Breast, AJCC 8th Edition - Clinical stage from 06/06/2017: Stage IB (cT1c, cN0, cM0, G3, ER: Negative, PR: Negative, HER2: Negative) - Signed by FTruitt Merle MD on 06/15/2017 - Pathologic stage from 06/26/2017: Stage IB (pT1c, pN0, cM0, G3, ER: Negative, PR: Negative, HER2: Negative) - Signed by FTruitt Merle MD on 07/10/2017     Malignant neoplasm of lower-outer quadrant of left breast of female, estrogen receptor negative ([facility]  05/30/2017 Mammogram   Diagnostic mammo and [place]PRESS[place]: 1. Highly suspicious 1.4 cm mass in the slightly lower slightly outer left breast -tissue sampling recommended. 2. Indeterminate 0.5 mm mass in the slightly lower slightly outer left breast -tissue sampling recommended. 3. At least 2 left axillary lymph nodes with borderline cortical thickness.   06/06/2017 Initial Biopsy   Diagnosis 1. Breast, left, needle core biopsy, 5:30 o'clock - INVASIVE DUCTAL CARCINOMA, G3 2. Lymph node, needle/core biopsy, left axillary - NO CARCINOMA IDENTIFIED IN ONE LYMPH NODE (0/1)   06/06/2017 Initial Diagnosis   Malignant neoplasm of lower-outer quadrant of left breast of female, estrogen receptor negative ([place]   06/06/2017 Receptors her2   Estrogen Receptor: 0%, NEGATIVE Progesterone Receptor: 0%, NEGATIVE Proliferation Marker Ki67: 70%   06/26/2017 Surgery   LEFT BREAST LUMPECTOMY WITH RADIOACTIVE  SEED AND SENTINEL LYMPH NODE BIOPSY ERAS  PATHWAY AND INSERTION PORT-A-CATH By Dr. BBarry Dieneson 06/26/17    06/26/2017 Pathology Results   Diagnosis 06/26/17  1. Breast, lumpectomy, Left - INVASIVE DUCTAL CARCINOMA, GRADE III/III, SPANNING 1.2 CM. - THE SURGICAL RESECTION MARGINS ARE NEGATIVE FOR CARCINOMA. - SEE ONCOLOGY TABLE BELOW. 2. Lymph node, sentinel, biopsy, Left axillary #1 - THERE IS NO EVIDENCE OF CARCINOMA IN 1 OF 1 LYMPH NODE (0/1). 3. Lymph node, sentinel, biopsy, Left axillary #2 - THERE IS NO EVIDENCE OF CARCINOMA IN 1 OF 1 LYMPH NODE (0/1). 4. Lymph node, sentinel, biopsy, Left axillary #3 - THERE IS NO EVIDENCE OF CARCINOMA IN 1 OF 1 LYMPH NODE (0/1).    07/25/2017 - 09/29/2017 Chemotherapy   Adjuvant cytoxan and docetaxel (TC) every 3 weekd for 4 cycles     11/19/2017 - 12/17/2017 Radiation Therapy   Adjuvant breast radiation Left breast treated to 42.5 Gy with 17 fx of 2.5 Gy followed by a boost of 7.5 Gy with 3 fx of 2.5 Gy      02/2019 Procedure   She had PAC removal in 02/2019.    07/23/2020 Imaging   MRI Lumbar Spine 07/23/20 IMPRESSION: 1. No significant disc herniation, spinal  canal or neural foraminal stenosis at any level. 2. Moderate facet degenerative changes at L3-4, L4-5 and L5-S1. 3. Multiple enlarged retroperitoneal and right iliac lymph nodes. Recommend correlation with CT of the abdomen and pelvis with contrast.   08/15/2020 Imaging   CT AP 08/15/20  IMPRESSION: 1. Right iliac and periaortic adenopathy is noted concerning for metastatic disease or lymphoma. Also noted is abnormal soft tissue mass anterior and posterior to the right iliac wing concerning for malignancy or metastatic disease. MRI is recommended for further evaluation. 2. Irregular lucency with sclerotic margins is seen involving the right iliac crest concerning for possible lytic lesion. MRI may be performed for further evaluation. 3. Enlarged fibroid uterus. 4. Small  gallstone. 5. Aortic atherosclerosis.   08/22/2020 Pathology Results   FINAL MICROSCOPIC DIAGNOSIS: 08/22/20  A. NEEDLE CORE, RIGHT GLUTEUS MINIMUMUS, BIOPSY:  -  Metastatic carcinoma  -  See comment   COMMENT:  By immunohistochemistry, the neoplastic cells are positive for  cytokeratin-7 and have weak patchy positivity for GATA3 but are negative for cytokeratin-20 and GCDFP.  The combined morphology and immunophenotype, support metastasis from the patient's known breast primary carcinoma.   ADDENDUM:  PROGNOSTIC INDICATOR RESULTS:  The tumor cells are NEGATIVE for Her2 (0).  Estrogen Receptor:       POSITIVE, 20%, WEAK STAINING  Progesterone Receptor:   NEGATIVE   PD-L1 (0) negative    09/04/2020 - 09/22/2020 Radiation Therapy   Palliative radiation to right iliac 09/04/20 - 09/22/20, Dr. Lisbeth Renshaw   09/26/2020 -  Chemotherapy   first line weekly Taxol 3 weeks on/1 week off starting 09/26/20 --Reduced to 2 weeks on/1 week off starting with C3 due to neuropathy. Stopped after C4 on 12/18/20 due to neuratrophy.  --Switched to Xeloda on 01/01/21 at 1540m in the AM and 20062min the PM for 2 weeks on/1 week off     10/13/2020 Procedure    PAC placement on 10/13/20.    04/10/2021 Imaging   CT CAP  IMPRESSION: 1. Enlarging abdominopelvic retroperitoneal and right inguinal adenopathy, compatible with disease progression. 2. Lytic metastasis in the right iliac wing, similar. 3. Hepatomegaly. 4. Cholelithiasis. 5. Enlarged fibroid uterus. 6.  Aortic atherosclerosis (ICD10-I70.0).   04/23/2021 -  Chemotherapy    Patient is on Treatment Plan: BREAST METASTATIC ERIBULIN Q21D          INTERVAL HISTORY:  PaEmmalou Ward is here for a follow up of metastatic breast cancer. She was last seen by me on 03/26/21. She presents to the clinic alone. She reports she recently accidentally threw away her remaining Xeloda.  She reports continued urinary symptoms. She notes she is also taking a number  of herbal supplements. She reports she continues to walk 2 miles each day.   All other systems were reviewed with the patient and are negative.  MEDICAL HISTORY:  Past Medical History:  Diagnosis Date   Abnormal x-ray of lungs with single pulmonary nodule 03/15/2016   [place]  [place]emia    Asthma    Cholelithiasis 05/19/2017   On CT   Coronary artery calcification seen on CAT scan 05/19/2017   Di[place] mellitus without complication ([place]   type 2   Dilated idiopathic cardiomyopathy ([facility]/2017   [facility] 15-20%. [facility]    NONE RECENT   Hypertension    Lymph nodes enlarged 07/25/2020   Malignant neoplasm of lower-outer quadrant of left breast of female, estrogen receptor negative (  [place]) 06/11/2017   left breast   Mixed hyperlipidemia 06/17/2018   partially blind    Personal history of chemotherapy 2018-2019   Personal history of radiation therapy 2019   Retinitis pigmentosa    Retroperitoneal lymphadenopathy 07/25/2020   Uveitis     SURGICAL HISTORY: Past Surgical History:  Procedure Laterality Date   brain cyst removed  2007   to help with headaches per patient , aspirated    BREAST BIOPSY Left 06/06/2017   x2   BREAST CYST ASPIRATION Left 06/06/2017   BREAST LUMPECTOMY Left 06/26/2017   BREAST LUMPECTOMY WITH RADIOACTIVE SEED AND SENTINEL LYMPH NODE BIOPSY Left 06/26/2017   Procedure: BREAST LUMPECTOMY WITH RADIOACTIVE SEED AND SENTINEL LYMPH NODE BIOPSY ERAS  PATHWAY;  Surgeon: [name], MD;  Location: [facility];  Service: General;  Laterality: Left;  pec block   FINE NEEDLE ASPIRATION Left 02/23/2019   Procedure: FINE NEEDLE ASPIRATION;  Surgeon: [name], MD;  Location: [facility];  Service: General;  Laterality: Left;  Asiration of left breast seroma    IR IMAGING GUIDED PORT INSERTION  10/13/2020   NASAL TURBINATE REDUCTION     PORT-A-CATH REMOVAL Right 02/23/2019   Procedure: REMOVAL  PORT-A-CATH;  Surgeon: [name], MD;  Location: [facility];  Service: General;  Laterality: Right;   PORTACATH PLACEMENT N/A 06/26/2017   Procedure: INSERTION PORT-A-CATH;  Surgeon: [name], MD;  Location: [facility];  Service: General;  Laterality: N/A;   TRANSTHORACIC ECHOCARDIOGRAM  12/2015   A) [facility] May 2017: Mild concentric LVH. Global hypokinesis. GR daily. EF 18% severe LA dilation. Mitral annular dilatation with papillary muscle dysfunction and moderate MR. Dilated IVC consistent with elevated RAP.   TRANSTHORACIC ECHOCARDIOGRAM  05/2017; 08/2017   a) Mild concentric LVH.  EF 45 to 50% with diffuse hypokinesis.  GR 1 DD.  Mild aortic root dilation.;; b)  Mild LVH EF 45 to 50%.  Diffuse HK.  No significant valve disease.  No significant change.    I have reviewed the social history and family history with the patient and they are unchanged from previous note.  ALLERGIES:  is allergic to morphine and related, latex, tylenol with codeine #3 [acetaminophen-codeine], and penicillins.  MEDICATIONS:  Current Outpatient Medications  Medication Sig Dispense Refill   Accu-Chek FastClix Lancets MISC TEST TWICE A DAY. PT USES AN ACCU-CHEK GUIDE ME METER 102 each 2   aspirin 81 MG chewable tablet Chew 81 mg by mouth daily.     capecitabine (XELODA) 500 MG tablet Take 4 tabs in morning and 4 tabs in evening, every 12hrs, for 14 days then off for 7 days. Repeat every 21 days. Take within 30 minutes after meals. 112 tablet 2   carvedilol (COREG) 12.5 MG tablet TAKE 1 TABLET (12.5 MG TOTAL) BY MOUTH 2 (TWO) TIMES DAILY WITH A MEAL. 180 tablet 1   Cholecalciferol (D3-1000 PO) Take by mouth.     Continuous Blood Gluc Sensor (FREESTYLE LIBRE 14 DAY SENSOR) MISC 1 each by Does not apply route every 14 (fourteen) days. 3 each 4   diclofenac (VOLTAREN) 75 MG EC tablet Take 1 tablet (75 mg total) by mouth 2 (two) times daily. 60 tablet 1   furosemide (LASIX) 20 MG tablet  Take 20 mg by mouth daily. Patient is unsure of dose     glucose blood (ACCU-CHEK GUIDE) test strip Use as instructed to test blood sugar 2 times a day 100 strip 2   ibuprofen (ADVIL)  800 MG tablet Take 1 tablet (800 mg total) by mouth every 8 (eight) hours as needed. 30 tablet 2   lidocaine-prilocaine (EMLA) cream Apply 1 application topically as needed. 30 g 0   LINZESS 72 MCG capsule TAKE 1 CAPSULE BY MOUTH DAILY BEFORE BREAKFAST. 30 capsule 1   metFORMIN (GLUCOPHAGE) 500 MG tablet Take 1 tablet (500 mg total) by mouth 2 (two) times daily with a meal. 180 tablet 1   Multiple Vitamins-Minerals (WOMENS MULTI PO) Take by mouth.     RYBELSUS 3 MG TABS TAKE 1 TABLET BY MOUTH DAILY WITH BREAKFAST. 30 tablet 3   Saccharomyces boulardii (PROBIOTIC) 250 MG CAPS Take by mouth. (Patient not taking: Reported on 03/08/2021)     sacubitril-valsartan (ENTRESTO) 97-103 MG Take 1 tablet by mouth 2 (two) times daily. 180 tablet 3   spironolactone (ALDACTONE) 25 MG tablet Take 1 tablet (25 mg total) by mouth daily. 90 tablet 3   vitamin B-12 (CYANOCOBALAMIN) 500 MCG tablet Take 500 mcg by mouth daily.     vitamin C (ASCORBIC ACID) 500 MG tablet Take 1,000 mg 2 (two) times daily by mouth.     No current facility-administered medications for this visit.    PHYSICAL EXAMINATION: ECOG PERFORMANCE STATUS: 1 - Symptomatic but completely ambulatory  Vitals:   04/17/21 1057  BP: 125/82  Pulse: 74  Resp: 18  Temp: 98.2 F (36.8 C)  SpO2: 97%   Wt Readings from Last 3 Encounters:  04/17/21 216 lb 11.2 oz (98.3 kg)  03/26/21 220 lb 8 oz (100 kg)  03/08/21 217 lb (98.4 kg)     GENERAL:alert, no distress and comfortable SKIN: skin color normal, no rashes or significant lesions EYES: normal, Conjunctiva are pink and non-injected, sclera clear  NEURO: alert & oriented x 3 with fluent speech  LABORATORY DATA:  I have reviewed the data as listed CBC Latest Ref Rng & Units 04/17/2021 03/26/2021 02/27/2021  WBC  4.0 - 10.5 K/uL 5.4 4.9 5.7  Hemoglobin 12.0 - 15.0 g/dL 11.8(L) 11.3(L) 12.5  Hematocrit 36.0 - 46.0 % 35.0(L) 33.7(L) 36.8  Platelets 150 - 400 K/uL 269 243 334     CMP Latest Ref Rng & Units 04/17/2021 03/26/2021 02/27/2021  Glucose 70 - 99 mg/dL 139(H) 157(H) 237(H)  BUN 8 - 23 mg/dL _0 Creatinine 0.44 - 1.00 mg/dL 0.84 0.84 0.97  Sodium 135 - 145 mmol/L 141 142 141  Potassium 3.5 - 5.1 mmol/L 4.1 4.2 3.8  Chloride 98 - 111 mmol/L 109 108 106  CO2 22 - 32 mmol/L _1 Calcium 8.9 - 10.3 mg/dL 9.6 9.3 10.5(H)  Total Protein 6.5 - 8.1 g/dL 7.8 7.3 8.3(H)  Total Bilirubin 0.3 - 1.2 mg/dL 0.7 0.6 0.8  Alkaline Phos 38 - 126 U/L 96 92 112  AST 15 - 41 U/L 20 23 14(L)  ALT 0 - 44 U/L _2 RADIOGRAPHIC STUDIES: I have personally reviewed the radiological images as listed and agreed with the findings in the report. No results found.    No orders of the defined types were placed in this encounter.  All questions were answered. The patient knows to call the clinic with any problems, questions or concerns. No barriers to learning was detected. The total time spent in the appointment was 30 minutes.     [name], MD 04/17/2021   I, Wilburn Mylar, am acting as scribe for [name], MD.   I  have reviewed the above documentation for accuracy and completeness, and I agree with the above.

## 2021-04-18 ENCOUNTER — Telehealth: Payer: Self-pay

## 2021-04-18 ENCOUNTER — Other Ambulatory Visit (HOSPITAL_COMMUNITY): Payer: Self-pay

## 2021-04-18 NOTE — Telephone Encounter (Signed)
This nurse reached out to [facility] per Dr. Burr Medico related to patient needing an immediate refill for her Xeloda.  Pharmacy rep stated that shipment was sent out on 04/17/21.  No further questions or concerns at this time.

## 2021-05-02 ENCOUNTER — Telehealth: Payer: Self-pay | Admitting: Cardiology

## 2021-05-02 MED ORDER — SACUBITRIL-VALSARTAN 97-103 MG PO TABS: 1.0000 | ORAL_TABLET | Freq: Two times a day (BID) | ORAL | 3 refills | Status: DC

## 2021-05-02 NOTE — Telephone Encounter (Signed)
*  STAT* If patient is at the pharmacy, call can be transferred to refill team.   1. Which medications need to be refilled? (please list name of each medication and dose if known) Entresto   2. Which pharmacy/location (including street and city if local pharmacy) is medication to be sent to? [facility]   3. Do they need a 30 day or 90 day supply? 90 Patient is out

## 2021-05-02 NOTE — Telephone Encounter (Signed)
Refills has been sent to th pharmacy. 

## 2021-05-03 ENCOUNTER — Other Ambulatory Visit (HOSPITAL_COMMUNITY): Payer: Self-pay

## 2021-05-04 DIAGNOSIS — E1142 Type 2 diabetes mellitus with diabetic polyneuropathy: Secondary | ICD-10-CM | POA: Diagnosis not present

## 2021-05-04 DIAGNOSIS — I739 Peripheral vascular disease, unspecified: Secondary | ICD-10-CM | POA: Diagnosis not present

## 2021-05-04 DIAGNOSIS — B351 Tinea unguium: Secondary | ICD-10-CM | POA: Diagnosis not present

## 2021-05-07 ENCOUNTER — Inpatient Hospital Stay (HOSPITAL_BASED_OUTPATIENT_CLINIC_OR_DEPARTMENT_OTHER): Payer: Medicare Other | Admitting: Hematology

## 2021-05-07 ENCOUNTER — Inpatient Hospital Stay: Payer: Medicare Other

## 2021-05-07 ENCOUNTER — Other Ambulatory Visit: Payer: Self-pay

## 2021-05-07 ENCOUNTER — Other Ambulatory Visit: Payer: Self-pay | Admitting: *Deleted

## 2021-05-07 ENCOUNTER — Ambulatory Visit: Payer: Medicare Other

## 2021-05-07 VITALS — BP 138/98 | HR 73 | Temp 97.8°F | Resp 17 | Wt 222.4 lb

## 2021-05-07 VITALS — BP 139/91 | HR 77 | Temp 98.3°F | Resp 17

## 2021-05-07 DIAGNOSIS — Z171 Estrogen receptor negative status [ER-]: Secondary | ICD-10-CM | POA: Diagnosis not present

## 2021-05-07 DIAGNOSIS — C799 Secondary malignant neoplasm of unspecified site: Secondary | ICD-10-CM

## 2021-05-07 DIAGNOSIS — M25551 Pain in right hip: Secondary | ICD-10-CM | POA: Diagnosis not present

## 2021-05-07 DIAGNOSIS — M47816 Spondylosis without myelopathy or radiculopathy, lumbar region: Secondary | ICD-10-CM | POA: Diagnosis not present

## 2021-05-07 DIAGNOSIS — C50512 Malignant neoplasm of lower-outer quadrant of left female breast: Secondary | ICD-10-CM

## 2021-05-07 DIAGNOSIS — Z95828 Presence of other vascular implants and grafts: Secondary | ICD-10-CM

## 2021-05-07 DIAGNOSIS — Z79899 Other long term (current) drug therapy: Secondary | ICD-10-CM | POA: Diagnosis not present

## 2021-05-07 DIAGNOSIS — C7951 Secondary malignant neoplasm of bone: Secondary | ICD-10-CM | POA: Diagnosis not present

## 2021-05-07 DIAGNOSIS — N390 Urinary tract infection, site not specified: Secondary | ICD-10-CM | POA: Diagnosis not present

## 2021-05-07 DIAGNOSIS — M47817 Spondylosis without myelopathy or radiculopathy, lumbosacral region: Secondary | ICD-10-CM | POA: Diagnosis not present

## 2021-05-07 DIAGNOSIS — T451X5A Adverse effect of antineoplastic and immunosuppressive drugs, initial encounter: Secondary | ICD-10-CM | POA: Diagnosis not present

## 2021-05-07 DIAGNOSIS — E559 Vitamin D deficiency, unspecified: Secondary | ICD-10-CM | POA: Diagnosis not present

## 2021-05-07 DIAGNOSIS — H548 Legal blindness, as defined in USA: Secondary | ICD-10-CM | POA: Diagnosis not present

## 2021-05-07 DIAGNOSIS — H3552 Pigmentary retinal dystrophy: Secondary | ICD-10-CM | POA: Diagnosis not present

## 2021-05-07 DIAGNOSIS — I5042 Chronic combined systolic (congestive) and diastolic (congestive) heart failure: Secondary | ICD-10-CM | POA: Diagnosis not present

## 2021-05-07 DIAGNOSIS — I110 Hypertensive heart disease with heart failure: Secondary | ICD-10-CM | POA: Diagnosis not present

## 2021-05-07 DIAGNOSIS — G620 Drug-induced polyneuropathy: Secondary | ICD-10-CM | POA: Diagnosis not present

## 2021-05-07 DIAGNOSIS — C50919 Malignant neoplasm of unspecified site of unspecified female breast: Secondary | ICD-10-CM

## 2021-05-07 DIAGNOSIS — E119 Type 2 diabetes mellitus without complications: Secondary | ICD-10-CM | POA: Diagnosis not present

## 2021-05-07 DIAGNOSIS — R160 Hepatomegaly, not elsewhere classified: Secondary | ICD-10-CM | POA: Diagnosis not present

## 2021-05-07 DIAGNOSIS — I2510 Atherosclerotic heart disease of native coronary artery without angina pectoris: Secondary | ICD-10-CM | POA: Diagnosis not present

## 2021-05-07 DIAGNOSIS — K8020 Calculus of gallbladder without cholecystitis without obstruction: Secondary | ICD-10-CM | POA: Diagnosis not present

## 2021-05-07 DIAGNOSIS — E785 Hyperlipidemia, unspecified: Secondary | ICD-10-CM | POA: Diagnosis not present

## 2021-05-07 DIAGNOSIS — J449 Chronic obstructive pulmonary disease, unspecified: Secondary | ICD-10-CM | POA: Diagnosis not present

## 2021-05-07 DIAGNOSIS — C774 Secondary and unspecified malignant neoplasm of inguinal and lower limb lymph nodes: Secondary | ICD-10-CM | POA: Diagnosis not present

## 2021-05-07 DIAGNOSIS — I700 Atherosclerosis of aorta: Secondary | ICD-10-CM | POA: Diagnosis not present

## 2021-05-07 LAB — CMP (CANCER CENTER ONLY)
ALT: 17 U/L (ref 0–44)
AST: 18 U/L (ref 15–41)
Albumin: 4 g/dL (ref 3.5–5.0)
Alkaline Phosphatase: 90 U/L (ref 38–126)
Anion gap: 8 (ref 5–15)
BUN: 15 mg/dL (ref 8–23)
CO2: 24 mmol/L (ref 22–32)
Calcium: 9.6 mg/dL (ref 8.9–10.3)
Chloride: 109 mmol/L (ref 98–111)
Creatinine: 0.81 mg/dL (ref 0.44–1.00)
GFR, Estimated: >60 mL/min (ref >60)
Glucose, Bld: 129 mg/dL — ABNORMAL HIGH (ref 70–99)
Potassium: 3.9 mmol/L (ref 3.5–5.1)
Sodium: 141 mmol/L (ref 135–145)
Total Bilirubin: 0.7 mg/dL (ref 0.3–1.2)
Total Protein: 7.6 g/dL (ref 6.5–8.1)

## 2021-05-07 LAB — CBC WITH DIFFERENTIAL (CANCER CENTER ONLY)
Abs Immature Granulocytes: 0.01 10*3/uL (ref 0.00–0.07)
Basophils Absolute: 0 10*3/uL (ref 0.0–0.1)
Basophils Relative: 0 %
Eosinophils Absolute: 0.2 10*3/uL (ref 0.0–0.5)
Eosinophils Relative: 5 %
HCT: 33.3 % — ABNORMAL LOW (ref 36.0–46.0)
Hemoglobin: 11.3 g/dL — ABNORMAL LOW (ref 12.0–15.0)
Immature Granulocytes: 0 %
Lymphocytes Relative: 15 %
Lymphs Abs: 0.7 10*3/uL (ref 0.7–4.0)
MCH: 32.8 pg (ref 26.0–34.0)
MCHC: 33.9 g/dL (ref 30.0–36.0)
MCV: 96.5 fL (ref 80.0–100.0)
Monocytes Absolute: 0.4 10*3/uL (ref 0.1–1.0)
Monocytes Relative: 8 %
Neutro Abs: 3.5 10*3/uL (ref 1.7–7.7)
Neutrophils Relative %: 72 %
Platelet Count: 282 10*3/uL (ref 150–400)
RBC: 3.45 MIL/uL — ABNORMAL LOW (ref 3.87–5.11)
RDW: 14.8 % (ref 11.5–15.5)
WBC Count: 4.9 10*3/uL (ref 4.0–10.5)
nRBC: 0 % (ref 0.0–0.2)

## 2021-05-07 MED ORDER — ZOLEDRONIC ACID 4 MG/100ML IV SOLN
4.0000 mg | Freq: Once | INTRAVENOUS | Status: AC
  Administered 2021-05-07: 4 mg via INTRAVENOUS
  Filled 2021-05-07: qty 100

## 2021-05-07 MED ORDER — SODIUM CHLORIDE 0.9 % IV SOLN: Freq: Once | INTRAVENOUS | Status: AC

## 2021-05-07 MED ORDER — SODIUM CHLORIDE 0.9% FLUSH
10.0000 mL | INTRAVENOUS | Status: DC | PRN
  Administered 2021-05-07: 10 mL via INTRAVENOUS

## 2021-05-07 NOTE — Progress Notes (Signed)
[place]   [phone] [phone]   Clinic Follow up Note   Patient Care Team: Girtha Rm, NP-C as PCP - General (Family Medicine) [name], MD as PCP - Cardiology (Cardiology) [name], MD as Consulting Physician (Hematology) [name], MD as Consulting Physician (General Surgery)  Date of Service:  05/07/2021  CHIEF COMPLAINT: f/u of metastatic breast cancer  CURRENT THERAPY:  -Xeloda at $RemoveBe'1500mg'irsQWFhmm$  in the AM and $Remo'2000mg'PLFLa$  in the PM for 2 weeks on/1 week off on 01/01/21.  -Increased to full dose $RemoveB'2000mg'CKcYVGhw$  BID with C2 on 01/22/21.  -Zometa q64months starting 11/20/20   ASSESSMENT & PLAN:  Kristin Ward is a 65 y.o. female with   1. Left breast invasive ductal carcinoma, stage IB, p(T1cN0M0), Triple negative, Grade 3, in 2018, metastatic disease to right gluteal mass/iliac and diffuse node metastasis in 08/2020 ER 20% weakly + PR/HER2 (0) negative, PD-L1 0% -She was initially diagnosed in 05/2017. She has high risk for recurrence due to triple negative disease. She is s/p left breast lumpectomy, adjuvant chemo TC and Radiation.  -Genetic testing was negative for pathogenetic mutations.  -Due to recent right gluteal pain, she underwent biopsy on 09/02/20 which showed metastatic carcinoma, consistent with breast primary. ER 20% positive, ER and HER2 negative.  -She weekly Taxol on 09/26/20 for 3 weeks on/ 1 week off. I discussed if she does well on chemo for 4-6 months, we can change treatment to antiestrogen therapy without chemo, given her ER weekly positive diease. Due to worsened neuropathy, Taxol stopped after C4 on 12/18/20. I switched her to Xeloda on 01/01/21 at $RemoveBe'1500mg'xQqyUOeeq$  in the AM and $Remo'2000mg'GHoOV$  in the PM 2 weeks on/1 week off.  -Her CT CAP from 01/17/21 showed: overall improved disease and interval decrease in size of metastatic adenopathy and mixed lytic and sclerotic bone mets; borderline enlarged right inguinal LN which is new since last scan,  possible reactive.  -We increased to full dose Xeloda, 2000 mg BID, on 01/22/21.  -Restaging CT CAP 04/10/21 showed slightly enlarging lymphadenopathy in retroperitoneal and right inguinal. No other new lesions.  -She is clinically doing well, will continue Xeloda for now and repeat PET in late 05/2021 for close f/u  -labs and f/u in 3 weeks   2.  UTI, h/o kidney stones -She clinically responded to Cipro -Urine culture showed multiple species -was noted to be passing kidney stones in early 03/2021. She provided me with a disc of her imaging (done in [place]).   3. Right hip pain, Right iliac bone met, Vit D deficiency -She had radiating right hip pain since 2020 from right iliac wing mass -She completed Palliative radiation to right iliac 09/04/20 - 09/22/20 with Dr. Lisbeth Renshaw, and her pain is much improved.  -Given localized bone met, I recommended Zometa q65months to strengthen her bones and treatment for her bone mets. She received one dose on 11/20/20. She will receive a dose today. -vit D has remained low, most recently 27.18 on 04/17/21.   4. G2 peripheral Neuropathy  -S/p C2D8 Taxol she developed mild tingling in her hands and feet.  -She uses ice bags at home as needed.  -She continues to have moderate numbness in her hands and light tingling in her feet. She can take B12 or acupuncture.  -Taxol stopped after 12/18/20. Her neuropathy is improving.  -Overall stable   5. DM, HTN, COPD, retinitis pigmentosa with limited vision, Chronic combined systolic and diastolic heart failure, EF 45-50% -Continue  to follow-up with PCP Dr Redmond School and cardiologist Dr Ellyn Hack  -she is legally blind -She recently tried insulin, but it made her sick, and she has stopped. She is on oral diabetic medicine and checking her sugars at home. BG overall improved since starting medication.   6. Goal of care discussion  -The patient understands the goal of care is palliative. -she is full code now      Plan -proceed with  Zometa today -continue Xeloda at $RemoveB'2000mg'ptIbnEVJ$  q12h for 14 days -labs and f/u in 3 weeks   No problem-specific Assessment & Plan notes found for this encounter.   SUMMARY OF ONCOLOGIC HISTORY: Oncology History Overview Note  Cancer Staging Malignant neoplasm of lower-outer quadrant of left breast of female, estrogen receptor negative ([place]) Staging form: Breast, AJCC 8th Edition - Clinical stage from 06/06/2017: Stage IB (cT1c, cN0, cM0, G3, ER: Negative, PR: Negative, HER2: Negative) - Signed by [name], MD on 06/15/2017 - Pathologic stage from 06/26/2017: Stage IB (pT1c, pN0, cM0, G3, ER: Negative, PR: Negative, HER2: Negative) - Signed by [name], MD on 07/10/2017     Malignant neoplasm of lower-outer quadrant of left breast of female, estrogen receptor negative ([place])  05/30/2017 Mammogram   Diagnostic mammo and US IMPRESSION: 1. Highly suspicious 1.4 cm mass in the slightly lower slightly outer left breast -tissue sampling recommended. 2. Indeterminate 0.5 mm mass in the slightly lower slightly outer left breast -tissue sampling recommended. 3. At least 2 left axillary lymph nodes with borderline cortical thickness.   06/06/2017 Initial Biopsy   Diagnosis 1. Breast, left, needle core biopsy, 5:30 o'clock - INVASIVE DUCTAL CARCINOMA, G3 2. Lymph node, needle/core biopsy, left axillary - NO CARCINOMA IDENTIFIED IN ONE LYMPH NODE (0/1)   06/06/2017 Initial Diagnosis   Malignant neoplasm of lower-outer quadrant of left breast of female, estrogen receptor negative ([place])   06/06/2017 Receptors her2   Estrogen Receptor: 0%, NEGATIVE Progesterone Receptor: 0%, NEGATIVE Proliferation Marker Ki67: 70%   06/26/2017 Surgery   LEFT BREAST LUMPECTOMY WITH RADIOACTIVE SEED AND SENTINEL LYMPH NODE BIOPSY ERAS  PATHWAY AND INSERTION PORT-A-CATH By Dr. Barry Dienes on 06/26/17    06/26/2017 Pathology Results   Diagnosis 06/26/17  1. Breast, lumpectomy, Left - INVASIVE DUCTAL CARCINOMA,  GRADE III/III, SPANNING 1.2 CM. - THE SURGICAL RESECTION MARGINS ARE NEGATIVE FOR CARCINOMA. - SEE ONCOLOGY TABLE BELOW. 2. Lymph node, sentinel, biopsy, Left axillary #1 - THERE IS NO EVIDENCE OF CARCINOMA IN 1 OF 1 LYMPH NODE (0/1). 3. Lymph node, sentinel, biopsy, Left axillary #2 - THERE IS NO EVIDENCE OF CARCINOMA IN 1 OF 1 LYMPH NODE (0/1). 4. Lymph node, sentinel, biopsy, Left axillary #3 - THERE IS NO EVIDENCE OF CARCINOMA IN 1 OF 1 LYMPH NODE (0/1).    07/25/2017 - 09/29/2017 Chemotherapy   Adjuvant cytoxan and docetaxel (TC) every 3 weekd for 4 cycles     11/19/2017 - 12/17/2017 Radiation Therapy   Adjuvant breast radiation Left breast treated to 42.5 Gy with 17 fx of 2.5 Gy followed by a boost of 7.5 Gy with 3 fx of 2.5 Gy      02/2019 Procedure   She had PAC removal in 02/2019.    07/23/2020 Imaging   MRI Lumbar Spine 07/23/20 IMPRESSION: 1. No significant disc herniation, spinal canal or neural foraminal stenosis at any level. 2. Moderate facet degenerative changes at L3-4, L4-5 and L5-S1. 3. Multiple enlarged retroperitoneal and right iliac lymph nodes. Recommend correlation with CT of the abdomen and pelvis with  contrast.   08/15/2020 Imaging   CT AP 08/15/20  IMPRESSION: 1. Right iliac and periaortic adenopathy is noted concerning for metastatic disease or lymphoma. Also noted is abnormal soft tissue mass anterior and posterior to the right iliac wing concerning for malignancy or metastatic disease. MRI is recommended for further evaluation. 2. Irregular lucency with sclerotic margins is seen involving the right iliac crest concerning for possible lytic lesion. MRI may be performed for further evaluation. 3. Enlarged fibroid uterus. 4. Small gallstone. 5. Aortic atherosclerosis.   08/22/2020 Pathology Results   FINAL MICROSCOPIC DIAGNOSIS: 08/22/20  A. NEEDLE CORE, RIGHT GLUTEUS MINIMUMUS, BIOPSY:  -  Metastatic carcinoma  -  See comment   COMMENT:  By  immunohistochemistry, the neoplastic cells are positive for  cytokeratin-7 and have weak patchy positivity for GATA3 but are negative for cytokeratin-20 and GCDFP.  The combined morphology and immunophenotype, support metastasis from the patient's known breast primary carcinoma.   ADDENDUM:  PROGNOSTIC INDICATOR RESULTS:  The tumor cells are NEGATIVE for Her2 (0).  Estrogen Receptor:       POSITIVE, 20%, WEAK STAINING  Progesterone Receptor:   NEGATIVE   PD-L1 (0) negative    09/04/2020 - 09/22/2020 Radiation Therapy   Palliative radiation to right iliac 09/04/20 - 09/22/20, Dr. Lisbeth Renshaw   09/26/2020 -  Chemotherapy   first line weekly Taxol 3 weeks on/1 week off starting 09/26/20 --Reduced to 2 weeks on/1 week off starting with C3 due to neuropathy. Stopped after C4 on 12/18/20 due to neuratrophy.  --Switched to Xeloda on 01/01/21 at $RemoveBe'1500mg'akFHHZxBM$  in the AM and $Remo'2000mg'GctHC$  in the PM for 2 weeks on/1 week off     10/13/2020 Procedure    PAC placement on 10/13/20.    04/10/2021 Imaging   CT CAP  IMPRESSION: 1. Enlarging abdominopelvic retroperitoneal and right inguinal adenopathy, compatible with disease progression. 2. Lytic metastasis in the right iliac wing, similar. 3. Hepatomegaly. 4. Cholelithiasis. 5. Enlarged fibroid uterus. 6.  Aortic atherosclerosis (ICD10-I70.0).   04/23/2021 -  Chemotherapy    Patient is on Treatment Plan: BREAST METASTATIC ERIBULIN Q21D          INTERVAL HISTORY:  Kristin Ward is here for a follow up of metastatic breast cancer. She was last seen by me on 04/17/21. She presents to the clinic alone. She reports some pain in her lower back that will come as a sharp pain and then resolve on its own quickly. She reports she has missed a few doses because of her busy schedule. She notes she was unsure if she could still take it late.  She also notes her daughter found the Xeloda she previously though she had thrown away. When asked about dental issues, she notes she is  "growing a wisdom tooth."   All other systems were reviewed with the patient and are negative.  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 Diagnosed in [facility]   Headache    NONE RECENT   Hypertension    Lymph nodes enlarged 07/25/2020   Malignant neoplasm of lower-outer quadrant of left breast of female, estrogen  receptor negative ([place]) 06/11/2017   left breast   Mixed hyperlipidemia 06/17/2018   partially blind    Personal history of chemotherapy 2018-2019   Personal history of radiation therapy 2019   Retinitis pigmentosa    Retroperitoneal lymphadenopathy 07/25/2020   Uveitis     SURGICAL HISTORY: Past Surgical History:  Procedure Laterality Date   brain cyst removed  2007   to help with headaches per patient , aspirated    BREAST BIOPSY Left 06/06/2017   x2   BREAST CYST ASPIRATION Left 06/06/2017   BREAST LUMPECTOMY Left 06/26/2017   BREAST LUMPECTOMY WITH RADIOACTIVE SEED AND SENTINEL LYMPH NODE BIOPSY Left 06/26/2017   Procedure: BREAST LUMPECTOMY WITH RADIOACTIVE SEED AND SENTINEL LYMPH NODE BIOPSY ERAS  PATHWAY;  Surgeon: [name], MD;  Location: [place];  Service: General;  Laterality: Left;  pec block   FINE NEEDLE ASPIRATION Left 02/23/2019   Procedure: FINE NEEDLE ASPIRATION;  Surgeon: [name], MD;  Location: [place];  Service: General;  Laterality: Left;  Asiration of left breast seroma    IR IMAGING GUIDED PORT INSERTION  10/13/2020   NASAL TURBINATE REDUCTION     PORT-A-CATH REMOVAL Right 02/23/2019   Procedure: REMOVAL PORT-A-CATH;  Surgeon: [name], MD;  Location:  [place];  Service: General;  Laterality: Right;   PORTACATH PLACEMENT N/A 06/26/2017   Procedure: INSERTION PORT-A-CATH;  Surgeon: [name], MD;  Location: [place];  Service: General;  Laterality: N/A;   TRANSTHORACIC ECHOCARDIOGRAM  12/2015   A) [facility] May 2017: Mild concentric LVH. Global hypokinesis. GR daily. EF 18% severe LA dilation. Mitral annular dilatation with papillary muscle dysfunction and moderate MR. Dilated IVC consistent with elevated RAP.   TRANSTHORACIC ECHOCARDIOGRAM  05/2017; 08/2017   a) Mild concentric LVH.  EF 45 to 50% with diffuse hypokinesis.  GR 1 DD.  Mild aortic root dilation.;; b)  Mild LVH EF 45 to 50%.  Diffuse HK.  No significant valve disease.  No significant change.    I have reviewed the social history and family history with the patient and they are unchanged from previous note.  ALLERGIES:  is allergic to morphine and related, latex, tylenol with codeine #3 [acetaminophen-codeine], and penicillins.  MEDICATIONS:  Current Outpatient Medications  Medication Sig Dispense Refill   Accu-Chek FastClix Lancets MISC TEST TWICE A DAY. PT USES AN ACCU-CHEK GUIDE ME METER 102 each 2   aspirin 81 MG chewable tablet Chew 81 mg by mouth daily.     capecitabine (XELODA) 500 MG tablet Take 4 tabs in morning and 4 tabs in evening, every 12hrs, for 14 days then off for 7 days. Repeat every 21 days. Take within 30 minutes after meals. 112 tablet 2   carvedilol (COREG) 12.5 MG tablet TAKE 1 TABLET (12.5 MG TOTAL) BY MOUTH 2 (TWO) TIMES DAILY WITH A MEAL. 180 tablet 1   Cholecalciferol (D3-1000 PO) Take by mouth.     Continuous Blood Gluc Sensor (FREESTYLE LIBRE 14 DAY SENSOR) MISC 1 each by Does not apply route every 14 (fourteen) days. 3 each 4   diclofenac (VOLTAREN) 75 MG EC tablet Take 1 tablet (75 mg total) by mouth 2 (two) times daily. 60 tablet 1   furosemide (LASIX) 20 MG tablet Take 20 mg by mouth daily. Patient is unsure of dose      glucose blood (ACCU-CHEK GUIDE) test strip Use as instructed to test blood sugar 2 times a day 100 strip 2  ibuprofen (ADVIL) 800 MG tablet Take 1 tablet (800 mg total) by mouth every 8 (eight) hours as needed. 30 tablet 2   lidocaine-prilocaine (EMLA) cream Apply 1 application topically as needed. 30 g 0   LINZESS 72 MCG capsule TAKE 1 CAPSULE BY MOUTH DAILY BEFORE BREAKFAST. 30 capsule 1   metFORMIN (GLUCOPHAGE) 500 MG tablet Take 1 tablet (500 mg total) by mouth 2 (two) times daily with a meal. 180 tablet 1   Multiple Vitamins-Minerals (WOMENS MULTI PO) Take by mouth.     RYBELSUS 3 MG TABS TAKE 1 TABLET BY MOUTH DAILY WITH BREAKFAST. 30 tablet 3   Saccharomyces boulardii (PROBIOTIC) 250 MG CAPS Take by mouth. (Patient not taking: Reported on 03/08/2021)     sacubitril-valsartan (ENTRESTO) 97-103 MG Take 1 tablet by mouth 2 (two) times daily. 180 tablet 3   spironolactone (ALDACTONE) 25 MG tablet Take 1 tablet (25 mg total) by mouth daily. 90 tablet 3   vitamin B-12 (CYANOCOBALAMIN) 500 MCG tablet Take 500 mcg by mouth daily.     vitamin C (ASCORBIC ACID) 500 MG tablet Take 1,000 mg 2 (two) times daily by mouth.     No current facility-administered medications for this visit.    PHYSICAL EXAMINATION: ECOG PERFORMANCE STATUS: 1 - Symptomatic but completely ambulatory  Vitals:   05/07/21 0816  BP: (!) 138/98  Pulse: 73  Resp: 17  Temp: 97.8 F (36.6 C)  SpO2: 97%   Wt Readings from Last 3 Encounters:  05/07/21 222 lb 6.4 oz (100.9 kg)  04/17/21 216 lb 11.2 oz (98.3 kg)  03/26/21 220 lb 8 oz (100 kg)     GENERAL:alert, no distress and comfortable SKIN: skin color normal, no rashes or significant lesions EYES: normal, Conjunctiva are pink and non-injected, sclera clear  NEURO: alert & oriented x 3 with fluent speech  LABORATORY DATA:  I have reviewed the data as listed CBC Latest Ref Rng & Units 05/07/2021 04/17/2021 03/26/2021  WBC 4.0 - 10.5 K/uL 4.9 5.4 4.9  Hemoglobin  12.0 - 15.0 g/dL 11.3(L) 11.8(L) 11.3(L)  Hematocrit 36.0 - 46.0 % 33.3(L) 35.0(L) 33.7(L)  Platelets 150 - 400 K/uL 282 269 243     CMP Latest Ref Rng & Units 05/07/2021 04/17/2021 03/26/2021  Glucose 70 - 99 mg/dL 129(H) 139(H) 157(H)  BUN 8 - 23 mg/dL $Remove'15 11 13  'MnPiPQZ$ Creatinine 0.44 - 1.00 mg/dL 0.81 0.84 0.84  Sodium 135 - 145 mmol/L 141 141 142  Potassium 3.5 - 5.1 mmol/L 3.9 4.1 4.2  Chloride 98 - 111 mmol/L 109 109 108  CO2 22 - 32 mmol/L $RemoveB'24 23 25  'NrSQZBmA$ Calcium 8.9 - 10.3 mg/dL 9.6 9.6 9.3  Total Protein 6.5 - 8.1 g/dL 7.6 7.8 7.3  Total Bilirubin 0.3 - 1.2 mg/dL 0.7 0.7 0.6  Alkaline Phos 38 - 126 U/L 90 96 92  AST 15 - 41 U/L $Remo'18 20 23  'VLwlY$ ALT 0 - 44 U/L $Remo'17 16 21      'ZKrUX$ RADIOGRAPHIC STUDIES: I have personally reviewed the radiological images as listed and agreed with the findings in the report. No results found.    No orders of the defined types were placed in this encounter.  All questions were answered. The patient knows to call the clinic with any problems, questions or concerns. No barriers to learning was detected. The total time spent in the appointment was 30 minutes.     [name], MD 05/07/2021   I, Wilburn Mylar, am acting as scribe for [employer]  Burr Medico, MD.   I have reviewed the above documentation for accuracy and completeness, and I agree with the above.

## 2021-05-07 NOTE — Patient Instructions (Signed)

## 2021-05-08 LAB — CANCER ANTIGEN 27.29: CA 27.29: 61.6 U/mL — ABNORMAL HIGH (ref 0.0–38.6)

## 2021-05-21 ENCOUNTER — Other Ambulatory Visit (HOSPITAL_COMMUNITY): Payer: Self-pay

## 2021-05-28 ENCOUNTER — Other Ambulatory Visit: Payer: Self-pay

## 2021-05-28 ENCOUNTER — Encounter: Payer: Self-pay | Admitting: Hematology

## 2021-05-28 ENCOUNTER — Other Ambulatory Visit (HOSPITAL_COMMUNITY): Payer: Self-pay

## 2021-05-28 ENCOUNTER — Inpatient Hospital Stay: Payer: Medicare Other | Attending: Hematology

## 2021-05-28 ENCOUNTER — Inpatient Hospital Stay (HOSPITAL_BASED_OUTPATIENT_CLINIC_OR_DEPARTMENT_OTHER): Payer: Medicare Other | Admitting: Hematology

## 2021-05-28 VITALS — BP 130/82 | HR 73 | Temp 98.2°F | Resp 18 | Ht 67.0 in | Wt 229.2 lb

## 2021-05-28 DIAGNOSIS — Z170 Estrogen receptor positive status [ER+]: Secondary | ICD-10-CM | POA: Diagnosis not present

## 2021-05-28 DIAGNOSIS — E119 Type 2 diabetes mellitus without complications: Secondary | ICD-10-CM | POA: Insufficient documentation

## 2021-05-28 DIAGNOSIS — E559 Vitamin D deficiency, unspecified: Secondary | ICD-10-CM | POA: Diagnosis not present

## 2021-05-28 DIAGNOSIS — I10 Essential (primary) hypertension: Secondary | ICD-10-CM | POA: Insufficient documentation

## 2021-05-28 DIAGNOSIS — C7951 Secondary malignant neoplasm of bone: Secondary | ICD-10-CM

## 2021-05-28 DIAGNOSIS — C50512 Malignant neoplasm of lower-outer quadrant of left female breast: Secondary | ICD-10-CM | POA: Diagnosis not present

## 2021-05-28 DIAGNOSIS — Z171 Estrogen receptor negative status [ER-]: Secondary | ICD-10-CM | POA: Diagnosis not present

## 2021-05-28 DIAGNOSIS — M25551 Pain in right hip: Secondary | ICD-10-CM | POA: Diagnosis not present

## 2021-05-28 LAB — COMPREHENSIVE METABOLIC PANEL
ALT: 14 U/L (ref 0–44)
AST: 18 U/L (ref 15–41)
Albumin: 4 g/dL (ref 3.5–5.0)
Alkaline Phosphatase: 104 U/L (ref 38–126)
Anion gap: 9 (ref 5–15)
BUN: 14 mg/dL (ref 8–23)
CO2: 24 mmol/L (ref 22–32)
Calcium: 9.9 mg/dL (ref 8.9–10.3)
Chloride: 109 mmol/L (ref 98–111)
Creatinine, Ser: 0.94 mg/dL (ref 0.44–1.00)
GFR, Estimated: >60 mL/min (ref >60)
Glucose, Bld: 159 mg/dL — ABNORMAL HIGH (ref 70–99)
Potassium: 4.1 mmol/L (ref 3.5–5.1)
Sodium: 142 mmol/L (ref 135–145)
Total Bilirubin: 0.8 mg/dL (ref 0.3–1.2)
Total Protein: 7.9 g/dL (ref 6.5–8.1)

## 2021-05-28 LAB — CBC WITH DIFFERENTIAL/PLATELET
Abs Immature Granulocytes: 0.03 10*3/uL (ref 0.00–0.07)
Basophils Absolute: 0 10*3/uL (ref 0.0–0.1)
Basophils Relative: 0 %
Eosinophils Absolute: 0.2 10*3/uL (ref 0.0–0.5)
Eosinophils Relative: 5 %
HCT: 34 % — ABNORMAL LOW (ref 36.0–46.0)
Hemoglobin: 11.7 g/dL — ABNORMAL LOW (ref 12.0–15.0)
Immature Granulocytes: 1 %
Lymphocytes Relative: 18 %
Lymphs Abs: 0.8 10*3/uL (ref 0.7–4.0)
MCH: 33.4 pg (ref 26.0–34.0)
MCHC: 34.4 g/dL (ref 30.0–36.0)
MCV: 97.1 fL (ref 80.0–100.0)
Monocytes Absolute: 0.4 10*3/uL (ref 0.1–1.0)
Monocytes Relative: 8 %
Neutro Abs: 3.1 10*3/uL (ref 1.7–7.7)
Neutrophils Relative %: 68 %
Platelets: 236 10*3/uL (ref 150–400)
RBC: 3.5 MIL/uL — ABNORMAL LOW (ref 3.87–5.11)
RDW: 14.6 % (ref 11.5–15.5)
WBC: 4.6 10*3/uL (ref 4.0–10.5)
nRBC: 0 % (ref 0.0–0.2)

## 2021-05-28 MED ORDER — CAPECITABINE 500 MG PO TABS
ORAL_TABLET | ORAL | 0 refills | Status: DC
  Filled 2021-05-28: qty 112, fill #0
  Filled ????-??-??: fill #0

## 2021-05-28 NOTE — Progress Notes (Signed)
[place]   [phone] [phone]   Clinic Follow up Note   Patient Care Team: Davy Pique as PCP - General (Family Medicine) [name], MD as PCP - Cardiology (Cardiology) [name], MD as Consulting Physician (Hematology) [name], MD as Consulting Physician (General Surgery)  Date of Service:  05/28/2021  CHIEF COMPLAINT: f/u of metastatic breast cancer  CURRENT THERAPY:  -Xeloda at 1515m in the AM and 20051min the PM for 2 weeks on/1 week off on 01/01/21.  -Increased to full dose 20008mID with C2 on 01/22/21.  -Zometa q3mo11montharting 11/20/20   ASSESSMENT & PLAN:  PatrJaszmine Ward 64 y61. female with   1. Left breast invasive ductal carcinoma, stage IB, p(T1cN0M0), Triple negative, Grade 3, in 2018, metastatic disease to right gluteal mass/iliac and diffuse node metastasis in 08/2020 ER 20% weakly + PR/HER2 (0) negative, PD-L1 0% -She was initially diagnosed in 05/2017. She has high risk for recurrence due to triple negative disease. She is s/p left breast lumpectomy, adjuvant chemo TC and Radiation.  -Genetic testing was negative for pathogenetic mutations.  -Due to recent right gluteal pain, she underwent biopsy on 09/02/20 which showed metastatic carcinoma, consistent with breast primary. ER 20% positive, ER and HER2 negative.  -She weekly Taxol on 09/26/20 for 3 weeks on/ 1 week off. I discussed if she does well on chemo for 4-6 months, we can change treatment to antiestrogen therapy without chemo, given her ER weekly positive diease. Due to worsened neuropathy, Taxol stopped after C4 on 12/18/20. I switched her to Xeloda on 01/01/21 at 1500mg53mthe AM and 2000mg 61mhe PM 2 weeks on/1 week off.  -Her CT CAP from 01/17/21 showed: overall improved disease and interval decrease in size of metastatic adenopathy and mixed lytic and sclerotic bone mets; borderline enlarged right inguinal LN which is new since last scan,  possible reactive.  -We increased to full dose Xeloda, 2000 mg BID, on 01/22/21.  -Restaging CT CAP 04/10/21 showed slightly enlarging lymphadenopathy in retroperitoneal and right inguinal. No other new lesions.  -She is clinically doing well, will continue Xeloda for now. She notes she has a relatively high copay; I will look into this for her. -labs and f/u in 3 weeks, with restaging scan prior to visit   2. Right hip pain, Right iliac bone met, Vit D deficiency -She had radiating right hip pain since 2020 from right iliac wing mass -She completed Palliative radiation to right iliac 09/04/20 - 09/22/20 with Dr. Moody,Lisbeth Renshawher pain is much improved.  -Given localized bone met, I recommended Zometa q3month59monthrengthen her bones and treatment for her bone mets. She received one dose on 11/20/20. She will receive a dose today. -vit D has remained low, most recently 27.18 on 04/17/21.   3. G2 peripheral Neuropathy  -S/p C2D8 Taxol she developed mild tingling in her hands and feet.  -She uses ice bags at home as needed.  -She continues to have moderate numbness in her hands and light tingling in her feet. She can take B12 or acupuncture.  -Taxol stopped after 12/18/20. Her neuropathy is improving.  -Overall stable   4. DM, HTN, COPD, retinitis pigmentosa with limited vision, Chronic combined systolic and diastolic heart failure, EF 45-50% -Continue to follow-up with PCP Dr LalondeRedmond Schoolrdiologist Dr HardingEllyn Hackis legally blind -She recently tried insulin, but it made her sick, and she has stopped. She  is on oral diabetic medicine and checking her sugars at home. BG overall improved since starting medication.   5. Goal of care discussion  -The patient understands the goal of care is palliative. -she is full code now      Plan -continue Xeloda at 2089m q12h for 14 days, then off for 7 days. I refilled today and will check her copay -labs and restaging scans CT/BONE in 3 weeks with f/u several  days after   No problem-specific Assessment & Plan notes found for this encounter.   SUMMARY OF ONCOLOGIC HISTORY: Oncology History Overview Note  Cancer Staging Malignant neoplasm of lower-outer quadrant of left breast of female, estrogen receptor negative ([place] Staging form: Breast, AJCC 8th Edition - Clinical stage from 06/06/2017: Stage IB (cT1c, cN0, cM0, G3, ER: Negative, PR: Negative, HER2: Negative) - Signed by FTruitt Merle MD on 06/15/2017 - Pathologic stage from 06/26/2017: Stage IB (pT1c, pN0, cM0, G3, ER: Negative, PR: Negative, HER2: Negative) - Signed by FTruitt Merle MD on 07/10/2017     Malignant neoplasm of lower-outer quadrant of left breast of female, estrogen receptor negative ([place]  05/30/2017 Mammogram   Diagnostic mammo and [place]PRESSION: 1. Highly suspicious 1.4 cm mass in the slightly lower slightly outer left breast -tissue sampling recommended. 2. Indeterminate 0.5 mm mass in the slightly lower slightly outer left breast -tissue sampling recommended. 3. At least 2 left axillary lymph nodes with borderline cortical thickness.   06/06/2017 Initial Biopsy   Diagnosis 1. Breast, left, needle core biopsy, 5:30 o'clock - INVASIVE DUCTAL CARCINOMA, G3 2. Lymph node, needle/core biopsy, left axillary - NO CARCINOMA IDENTIFIED IN ONE LYMPH NODE (0/1)   06/06/2017 Initial Diagnosis   Malignant neoplasm of lower-outer quadrant of left breast of female, estrogen receptor negative ([place]   06/06/2017 Receptors her2   Estrogen Receptor: 0%, NEGATIVE Progesterone Receptor: 0%, NEGATIVE Proliferation Marker Ki67: 70%   06/26/2017 Surgery   LEFT BREAST LUMPECTOMY WITH RADIOACTIVE SEED AND SENTINEL LYMPH NODE BIOPSY ERAS  PATHWAY AND INSERTION PORT-A-CATH By Dr. BBarry Dieneson 06/26/17    06/26/2017 Pathology Results   Diagnosis 06/26/17  1. Breast, lumpectomy, Left - INVASIVE DUCTAL CARCINOMA, GRADE III/III, SPANNING 1.2 CM. - THE SURGICAL RESECTION MARGINS ARE  NEGATIVE FOR CARCINOMA. - SEE ONCOLOGY TABLE BELOW. 2. Lymph node, sentinel, biopsy, Left axillary #1 - THERE IS NO EVIDENCE OF CARCINOMA IN 1 OF 1 LYMPH NODE (0/1). 3. Lymph node, sentinel, biopsy, Left axillary #2 - THERE IS NO EVIDENCE OF CARCINOMA IN 1 OF 1 LYMPH NODE (0/1). 4. Lymph node, sentinel, biopsy, Left axillary #3 - THERE IS NO EVIDENCE OF CARCINOMA IN 1 OF 1 LYMPH NODE (0/1).    07/25/2017 - 09/29/2017 Chemotherapy   Adjuvant cytoxan and docetaxel (TC) every 3 weekd for 4 cycles     11/19/2017 - 12/17/2017 Radiation Therapy   Adjuvant breast radiation Left breast treated to 42.5 Gy with 17 fx of 2.5 Gy followed by a boost of 7.5 Gy with 3 fx of 2.5 Gy      02/2019 Procedure   She had PAC removal in 02/2019.    07/23/2020 Imaging   MRI Lumbar Spine 07/23/20 IMPRESSION: 1. No significant disc herniation, spinal canal or neural foraminal stenosis at any level. 2. Moderate facet degenerative changes at L3-4, L4-5 and L5-S1. 3. Multiple enlarged retroperitoneal and right iliac lymph nodes. Recommend correlation with CT of the abdomen and pelvis with contrast.   08/15/2020 Imaging   CT AP 08/15/20  IMPRESSION: 1.  Right iliac and periaortic adenopathy is noted concerning for metastatic disease or lymphoma. Also noted is abnormal soft tissue mass anterior and posterior to the right iliac wing concerning for malignancy or metastatic disease. MRI is recommended for further evaluation. 2. Irregular lucency with sclerotic margins is seen involving the right iliac crest concerning for possible lytic lesion. MRI may be performed for further evaluation. 3. Enlarged fibroid uterus. 4. Small gallstone. 5. Aortic atherosclerosis.   08/22/2020 Pathology Results   FINAL MICROSCOPIC DIAGNOSIS: 08/22/20  A. NEEDLE CORE, RIGHT GLUTEUS MINIMUMUS, BIOPSY:  -  Metastatic carcinoma  -  See comment   COMMENT:  By immunohistochemistry, the neoplastic cells are positive for   cytokeratin-7 and have weak patchy positivity for GATA3 but are negative for cytokeratin-20 and GCDFP.  The combined morphology and immunophenotype, support metastasis from the patient's known breast primary carcinoma.   ADDENDUM:  PROGNOSTIC INDICATOR RESULTS:  The tumor cells are NEGATIVE for Her2 (0).  Estrogen Receptor:       POSITIVE, 20%, WEAK STAINING  Progesterone Receptor:   NEGATIVE   PD-L1 (0) negative    09/04/2020 - 09/22/2020 Radiation Therapy   Palliative radiation to right iliac 09/04/20 - 09/22/20, Dr. Lisbeth Renshaw   09/26/2020 -  Chemotherapy   first line weekly Taxol 3 weeks on/1 week off starting 09/26/20 --Reduced to 2 weeks on/1 week off starting with C3 due to neuropathy. Stopped after C4 on 12/18/20 due to neuratrophy.  --Switched to Xeloda on 01/01/21 at 1569m in the AM and 20092min the PM for 2 weeks on/1 week off     10/13/2020 Procedure    PAC placement on 10/13/20.    04/10/2021 Imaging   CT CAP  IMPRESSION: 1. Enlarging abdominopelvic retroperitoneal and right inguinal adenopathy, compatible with disease progression. 2. Lytic metastasis in the right iliac wing, similar. 3. Hepatomegaly. 4. Cholelithiasis. 5. Enlarged fibroid uterus. 6.  Aortic atherosclerosis (ICD10-I70.0).   04/23/2021 -  Chemotherapy    Patient is on Treatment Plan: BREAST METASTATIC ERIBULIN Q21D          INTERVAL HISTORY:  PaTayonna Bachaowens is here for a follow up of metastatic breast cancer. She was last seen by me on 05/07/21. She presents to the clinic alone. She reports stable side effects from Xeloda. She has darkening and dryness to her hands. She notes tingling, stable in her fingers and improving in her feet. She notes she started her off week of Xeloda today.   All other systems were reviewed with the patient and are negative.  MEDICAL HISTORY:  Past Medical History:  Diagnosis Date   Abnormal x-ray of lungs with single pulmonary nodule 03/15/2016   [place]  Anemia    Asthma    Cholelithiasis 05/19/2017   On CT   Coronary artery calcification seen on CAT scan 05/19/2017   Diabetes mellitus without complication ([facility]   type 2   Dilated idiopathic cardiomyopathy ([facility]/2017   EF 15-20%. [facility]    NONE RECENT   Hypertension    Lymph nodes enlarged 07/25/2020   Malignant neoplasm of lower-outer quadrant of left breast of female, estrogen receptor negative ([place]10/31/2018   left breast   Mixed hyperlipidemia 06/17/2018   partially blind    Personal history of chemotherapy 2018-2019   Personal history of radiation therapy 2019   Retinitis pigmentosa    Retroperitoneal lymphadenopathy 07/25/2020   Uveitis     SURGICAL HISTORY:  Past Surgical History:  Procedure Laterality Date   brain cyst removed  2007   to help with headaches per patient , aspirated    BREAST BIOPSY Left 06/06/2017   x2   BREAST CYST ASPIRATION Left 06/06/2017   BREAST LUMPECTOMY Left 06/26/2017   BREAST LUMPECTOMY WITH RADIOACTIVE SEED AND SENTINEL LYMPH NODE BIOPSY Left 06/26/2017   Procedure: BREAST LUMPECTOMY WITH RADIOACTIVE SEED AND SENTINEL LYMPH NODE BIOPSY ERAS  PATHWAY;  Surgeon: [name], MD;  Location: [place];  Service: General;  Laterality: Left;  pec block   FINE NEEDLE ASPIRATION Left 02/23/2019   Procedure: FINE NEEDLE ASPIRATION;  Surgeon: [name], MD;  Location: [place];  Service: General;  Laterality: Left;  Asiration of left breast seroma    IR IMAGING GUIDED PORT INSERTION  10/13/2020   NASAL TURBINATE REDUCTION     PORT-A-CATH REMOVAL Right 02/23/2019   Procedure: REMOVAL PORT-A-CATH;  Surgeon: [name], MD;  Location: [place];  Service: General;  Laterality: Right;   PORTACATH PLACEMENT N/A 06/26/2017   Procedure: INSERTION PORT-A-CATH;  Surgeon: [name], MD;  Location: [place];  Service: General;  Laterality: N/A;   TRANSTHORACIC  ECHOCARDIOGRAM  12/2015   A) [facility] May 2017: Mild concentric LVH. Global hypokinesis. GR daily. EF 18% severe LA dilation. Mitral annular dilatation with papillary muscle dysfunction and moderate MR. Dilated IVC consistent with elevated RAP.   TRANSTHORACIC ECHOCARDIOGRAM  05/2017; 08/2017   a) Mild concentric LVH.  EF 45 to 50% with diffuse hypokinesis.  GR 1 DD.  Mild aortic root dilation.;; b)  Mild LVH EF 45 to 50%.  Diffuse HK.  No significant valve disease.  No significant change.    I have reviewed the social history and family history with the patient and they are unchanged from previous note.  ALLERGIES:  is allergic to morphine and related, latex, tylenol with codeine #3 [acetaminophen-codeine], and penicillins.  MEDICATIONS:  Current Outpatient Medications  Medication Sig Dispense Refill   Accu-Chek FastClix Lancets MISC TEST TWICE A DAY. PT USES AN ACCU-CHEK GUIDE ME METER 102 each 2   aspirin 81 MG chewable tablet Chew 81 mg by mouth daily.     capecitabine (XELODA) 500 MG tablet Take 4 tabs in morning and 4 tabs in evening, every 12hrs, for 14 days then off for 7 days. Repeat every 21 days. Take within 30 minutes after meals. 112 tablet 0   carvedilol (COREG) 12.5 MG tablet TAKE 1 TABLET (12.5 MG TOTAL) BY MOUTH 2 (TWO) TIMES DAILY WITH A MEAL. 180 tablet 1   Cholecalciferol (D3-1000 PO) Take by mouth.     Continuous Blood Gluc Sensor (FREESTYLE LIBRE 14 DAY SENSOR) MISC 1 each by Does not apply route every 14 (fourteen) days. 3 each 4   diclofenac (VOLTAREN) 75 MG EC tablet Take 1 tablet (75 mg total) by mouth 2 (two) times daily. 60 tablet 1   furosemide (LASIX) 20 MG tablet Take 20 mg by mouth daily. Patient is unsure of dose     glucose blood (ACCU-CHEK GUIDE) test strip Use as instructed to test blood sugar 2 times a day 100 strip 2   ibuprofen (ADVIL) 800 MG tablet Take 1 tablet (800 mg total) by mouth every 8 (eight) hours as needed. 30 tablet 2    lidocaine-prilocaine (EMLA) cream Apply 1 application topically as needed. 30 g 0   LINZESS 72 MCG capsule TAKE 1 CAPSULE BY MOUTH DAILY BEFORE BREAKFAST. 30 capsule  1   metFORMIN (GLUCOPHAGE) 500 MG tablet Take 1 tablet (500 mg total) by mouth 2 (two) times daily with a meal. 180 tablet 1   Multiple Vitamins-Minerals (WOMENS MULTI PO) Take by mouth.     RYBELSUS 3 MG TABS TAKE 1 TABLET BY MOUTH DAILY WITH BREAKFAST. 30 tablet 3   Saccharomyces boulardii (PROBIOTIC) 250 MG CAPS Take by mouth. (Patient not taking: Reported on 03/08/2021)     sacubitril-valsartan (ENTRESTO) 97-103 MG Take 1 tablet by mouth 2 (two) times daily. 180 tablet 3   spironolactone (ALDACTONE) 25 MG tablet Take 1 tablet (25 mg total) by mouth daily. 90 tablet 3   vitamin B-12 (CYANOCOBALAMIN) 500 MCG tablet Take 500 mcg by mouth daily.     vitamin C (ASCORBIC ACID) 500 MG tablet Take 1,000 mg 2 (two) times daily by mouth.     No current facility-administered medications for this visit.    PHYSICAL EXAMINATION: ECOG PERFORMANCE STATUS: 1 - Symptomatic but completely ambulatory  Vitals:   05/28/21 1143  BP: 130/82  Pulse: 73  Resp: 18  Temp: 98.2 F (36.8 C)  SpO2: 99%   Wt Readings from Last 3 Encounters:  05/28/21 229 lb 3.2 oz (104 kg)  05/07/21 222 lb 6.4 oz (100.9 kg)  04/17/21 216 lb 11.2 oz (98.3 kg)     GENERAL:alert, no distress and comfortable SKIN: skin color normal, no rashes or significant lesions EYES: normal, Conjunctiva are pink and non-injected, sclera clear  NEURO: alert & oriented x 3 with fluent speech  LABORATORY DATA:  I have reviewed the data as listed CBC Latest Ref Rng & Units 05/28/2021 05/07/2021 04/17/2021  WBC 4.0 - 10.5 K/uL 4.6 4.9 5.4  Hemoglobin 12.0 - 15.0 g/dL 11.7(L) 11.3(L) 11.8(L)  Hematocrit 36.0 - 46.0 % 34.0(L) 33.3(L) 35.0(L)  Platelets 150 - 400 K/uL 236 282 269     CMP Latest Ref Rng & Units 05/28/2021 05/07/2021 04/17/2021  Glucose 70 - 99 mg/dL 159(H) 129(H)  139(H)  BUN 8 - 23 mg/dL '14 15 11  ' Creatinine 0.44 - 1.00 mg/dL 0.94 0.81 0.84  Sodium 135 - 145 mmol/L 142 141 141  Potassium 3.5 - 5.1 mmol/L 4.1 3.9 4.1  Chloride 98 - 111 mmol/L 109 109 109  CO2 22 - 32 mmol/L '24 24 23  ' Calcium 8.9 - 10.3 mg/dL 9.9 9.6 9.6  Total Protein 6.5 - 8.1 g/dL 7.9 7.6 7.8  Total Bilirubin 0.3 - 1.2 mg/dL 0.8 0.7 0.7  Alkaline Phos 38 - 126 U/L 104 90 96  AST 15 - 41 U/L '18 18 20  ' ALT 0 - 44 U/L '14 17 16      ' RADIOGRAPHIC STUDIES: I have personally reviewed the radiological images as listed and agreed with the findings in the report. No results found.    Orders Placed This Encounter  Procedures   CT CHEST ABDOMEN PELVIS W CONTRAST    Standing Status:   Future    Standing Expiration Date:   05/28/2022    Order Specific Question:   Preferred imaging location?    Answer:   [facility]    Order Specific Question:   Is Oral Contrast requested for this exam?    Answer:   Yes, Per Radiology protocol   NM Bone Scan Whole Body    Standing Status:   Future    Standing Expiration Date:   05/28/2022    Order Specific Question:   If indicated for the ordered procedure, I authorize the  administration of a radiopharmaceutical per Radiology protocol    Answer:   Yes    Order Specific Question:   Preferred imaging location?    Answer:   [facility]   All questions were answered. The patient knows to call the clinic with any problems, questions or concerns. No barriers to learning was detected. The total time spent in the appointment was 30 minutes.     [name], MD 05/28/2021   I, Wilburn Mylar, am acting as scribe for [name], MD.   I have reviewed the above documentation for accuracy and completeness, and I agree with the above.

## 2021-05-28 NOTE — Progress Notes (Signed)
Received referral regarding assistance with Xeloda copay.   Called patient to screen for possible [employer] and she had previously received the grant therefore ineligible.  Sent staff message to care team and Benjamine Mola in pharmacy for further review.  She has my contact name and number for any additional financial questions or concerns.

## 2021-05-29 ENCOUNTER — Encounter: Payer: Self-pay | Admitting: *Deleted

## 2021-05-29 NOTE — Progress Notes (Signed)
[place] CSW Progress Report  Social Work Intern spoke to patient over the phone per financial advocate referral. Intern gave patient information regarding [employer], [place], and [employer] financial request forms. Intern has left printed copies of forms at the front desk for patient to pick up on 10/19.  [name], BSW Intern Supervised by Gwinda Maine, LCSW

## 2021-06-12 ENCOUNTER — Encounter: Payer: Medicare Other | Admitting: Family Medicine

## 2021-06-14 ENCOUNTER — Other Ambulatory Visit (HOSPITAL_COMMUNITY): Payer: Self-pay

## 2021-06-15 ENCOUNTER — Encounter: Payer: Self-pay | Admitting: Pharmacist

## 2021-06-15 DIAGNOSIS — G720 Drug-induced myopathy: Secondary | ICD-10-CM

## 2021-06-15 NOTE — Progress Notes (Signed)
[facility] [facility])                                            [place] Team                                        Statin Quality Measure Assessment    06/15/2021  Amilliana Hayworth [facility] [birth year] [id number]    I am a [facility] clinical pharmacist that reviews patients for statin quality initiatives.     Per review of chart and payor information, patient has a diagnosis of diabetes but is not currently filling a statin prescription.  This places patient into the SUPD (Statin Use In Patients with Diabetes) measure for CMS.    Patient has documented trials of atorvastatin with reported hip pain, but no corresponding CPT codes that would exclude patient from SUPD measure.  The 10-year ASCVD risk score (Arnett DK, et al., 2019) is: 20%   Values used to calculate the score:     Age: 65 years     Sex: Female     Is Non-Hispanic African American: Yes     Diabetic: Yes     Tobacco smoker: No     Systolic Blood Pressure: 185 mmHg     Is BP treated: Yes     HDL Cholesterol: 46 mg/dL     Total Cholesterol: 206 mg/dL 03/08/2021     Component Value Date/Time   CHOL 206 (H) 03/08/2021 1251   TRIG 186 (H) 03/08/2021 1251   HDL 46 03/08/2021 1251   CHOLHDL 4.5 (H) 03/08/2021 1251   CHOLHDL 4.2 03/20/2017 0943   VLDL 21 03/20/2017 0943   LDLCALC 127 (H) 03/08/2021 1251    Please consider ONE of the following recommendations:  Initiate high intensity statin Atorvastatin 40mg  once daily, #90, 3 refills   Rosuvastatin 20mg  once daily, #90, 3 refills    Initiate moderate intensity          statin with reduced frequency if prior          statin intolerance 1x weekly, #13, 3 refills   2x weekly, #26, 3 refills   3x weekly, #39, 3 refills    Code for past statin intolerance or  other exclusions (required annually)   Provider Requirements:  Associate code during an office visit or telehealth encounter  Drug Induced Myopathy G72.0    Myopathy, unspecified G72.9   Myositis, unspecified M60.9   Rhabdomyolysis U31.49   Alcoholic fatty liver F02.6   Cirrhosis of liver K74.69   Prediabetes R73.03   PCOS E28.2   Toxic liver disease, unspecified K71.9   Adverse effect of antihyperlipidemic and antiarteriosclerotic drugs, initial encounter V78.5Y8F    Plan: route note to PCP prior to the 06/25/21 appointment.  Elayne Guerin, PharmD, [place] Clinical Pharmacist [phone]

## 2021-06-18 ENCOUNTER — Other Ambulatory Visit: Payer: Self-pay

## 2021-06-18 ENCOUNTER — Encounter (HOSPITAL_COMMUNITY): Payer: Self-pay

## 2021-06-18 ENCOUNTER — Other Ambulatory Visit (HOSPITAL_COMMUNITY): Payer: Self-pay

## 2021-06-18 ENCOUNTER — Ambulatory Visit (HOSPITAL_COMMUNITY)
Admission: RE | Admit: 2021-06-18 | Discharge: 2021-06-18 | Disposition: A | Discharge status: Home / Self Care | Payer: Medicare Other | Source: Ambulatory Visit | Attending: Hematology | Admitting: Hematology

## 2021-06-18 DIAGNOSIS — C50512 Malignant neoplasm of lower-outer quadrant of left female breast: Secondary | ICD-10-CM | POA: Diagnosis not present

## 2021-06-18 DIAGNOSIS — K639 Disease of intestine, unspecified: Secondary | ICD-10-CM | POA: Diagnosis not present

## 2021-06-18 DIAGNOSIS — I2510 Atherosclerotic heart disease of native coronary artery without angina pectoris: Secondary | ICD-10-CM | POA: Diagnosis not present

## 2021-06-18 DIAGNOSIS — K8020 Calculus of gallbladder without cholecystitis without obstruction: Secondary | ICD-10-CM | POA: Diagnosis not present

## 2021-06-18 DIAGNOSIS — M533 Sacrococcygeal disorders, not elsewhere classified: Secondary | ICD-10-CM | POA: Diagnosis not present

## 2021-06-18 DIAGNOSIS — Z171 Estrogen receptor negative status [ER-]: Secondary | ICD-10-CM | POA: Diagnosis not present

## 2021-06-18 DIAGNOSIS — C50912 Malignant neoplasm of unspecified site of left female breast: Secondary | ICD-10-CM | POA: Diagnosis not present

## 2021-06-18 DIAGNOSIS — J479 Bronchiectasis, uncomplicated: Secondary | ICD-10-CM | POA: Diagnosis not present

## 2021-06-18 MED ORDER — HEPARIN SOD (PORK) LOCK FLUSH 100 UNIT/ML IV SOLN
500.0000 [IU] | Freq: Once | INTRAVENOUS | Status: AC
  Administered 2021-06-18: 500 [IU] via INTRAVENOUS

## 2021-06-18 MED ORDER — HEPARIN SOD (PORK) LOCK FLUSH 100 UNIT/ML IV SOLN
INTRAVENOUS | Status: AC
  Filled 2021-06-18: qty 5

## 2021-06-18 MED ORDER — IOHEXOL 350 MG/ML SOLN
80.0000 mL | Freq: Once | INTRAVENOUS | Status: AC | PRN
  Administered 2021-06-18: 80 mL via INTRAVENOUS

## 2021-06-20 ENCOUNTER — Encounter (HOSPITAL_COMMUNITY)
Admission: RE | Admit: 2021-06-20 | Discharge: 2021-06-20 | Disposition: A | Discharge status: Home / Self Care | Payer: Medicare Other | Source: Ambulatory Visit | Attending: Hematology | Admitting: Hematology

## 2021-06-20 ENCOUNTER — Other Ambulatory Visit: Payer: Self-pay

## 2021-06-20 DIAGNOSIS — C7951 Secondary malignant neoplasm of bone: Secondary | ICD-10-CM | POA: Insufficient documentation

## 2021-06-20 DIAGNOSIS — M545 Low back pain, unspecified: Secondary | ICD-10-CM | POA: Diagnosis not present

## 2021-06-20 DIAGNOSIS — M170 Bilateral primary osteoarthritis of knee: Secondary | ICD-10-CM | POA: Diagnosis not present

## 2021-06-20 DIAGNOSIS — C50919 Malignant neoplasm of unspecified site of unspecified female breast: Secondary | ICD-10-CM | POA: Diagnosis not present

## 2021-06-20 MED ORDER — TECHNETIUM TC 99M MEDRONATE IV KIT
21.0000 | PACK | Freq: Once | INTRAVENOUS | Status: AC
  Administered 2021-06-20: 21 via INTRAVENOUS

## 2021-06-22 ENCOUNTER — Encounter: Payer: Self-pay | Admitting: Hematology

## 2021-06-22 ENCOUNTER — Inpatient Hospital Stay: Payer: Medicare Other | Attending: Hematology

## 2021-06-22 ENCOUNTER — Other Ambulatory Visit (HOSPITAL_COMMUNITY): Payer: Self-pay

## 2021-06-22 ENCOUNTER — Other Ambulatory Visit: Payer: Self-pay

## 2021-06-22 ENCOUNTER — Inpatient Hospital Stay (HOSPITAL_BASED_OUTPATIENT_CLINIC_OR_DEPARTMENT_OTHER): Payer: Medicare Other | Admitting: Hematology

## 2021-06-22 VITALS — BP 134/79 | HR 76 | Temp 98.2°F | Resp 18 | Ht 67.0 in | Wt 233.7 lb

## 2021-06-22 DIAGNOSIS — C50512 Malignant neoplasm of lower-outer quadrant of left female breast: Secondary | ICD-10-CM | POA: Diagnosis not present

## 2021-06-22 DIAGNOSIS — E119 Type 2 diabetes mellitus without complications: Secondary | ICD-10-CM | POA: Insufficient documentation

## 2021-06-22 DIAGNOSIS — I110 Hypertensive heart disease with heart failure: Secondary | ICD-10-CM | POA: Insufficient documentation

## 2021-06-22 DIAGNOSIS — J449 Chronic obstructive pulmonary disease, unspecified: Secondary | ICD-10-CM | POA: Insufficient documentation

## 2021-06-22 DIAGNOSIS — I5042 Chronic combined systolic (congestive) and diastolic (congestive) heart failure: Secondary | ICD-10-CM | POA: Diagnosis not present

## 2021-06-22 DIAGNOSIS — E559 Vitamin D deficiency, unspecified: Secondary | ICD-10-CM | POA: Insufficient documentation

## 2021-06-22 DIAGNOSIS — Z171 Estrogen receptor negative status [ER-]: Secondary | ICD-10-CM | POA: Insufficient documentation

## 2021-06-22 DIAGNOSIS — G620 Drug-induced polyneuropathy: Secondary | ICD-10-CM | POA: Insufficient documentation

## 2021-06-22 DIAGNOSIS — Z5112 Encounter for antineoplastic immunotherapy: Secondary | ICD-10-CM | POA: Diagnosis not present

## 2021-06-22 DIAGNOSIS — C7951 Secondary malignant neoplasm of bone: Secondary | ICD-10-CM | POA: Insufficient documentation

## 2021-06-22 LAB — CBC WITH DIFFERENTIAL/PLATELET
Abs Immature Granulocytes: 0.01 10*3/uL (ref 0.00–0.07)
Basophils Absolute: 0 10*3/uL (ref 0.0–0.1)
Basophils Relative: 0 %
Eosinophils Absolute: 0.2 10*3/uL (ref 0.0–0.5)
Eosinophils Relative: 4 %
HCT: 32.9 % — ABNORMAL LOW (ref 36.0–46.0)
Hemoglobin: 11.3 g/dL — ABNORMAL LOW (ref 12.0–15.0)
Immature Granulocytes: 0 %
Lymphocytes Relative: 21 %
Lymphs Abs: 0.9 10*3/uL (ref 0.7–4.0)
MCH: 33.7 pg (ref 26.0–34.0)
MCHC: 34.3 g/dL (ref 30.0–36.0)
MCV: 98.2 fL (ref 80.0–100.0)
Monocytes Absolute: 0.4 10*3/uL (ref 0.1–1.0)
Monocytes Relative: 9 %
Neutro Abs: 2.8 10*3/uL (ref 1.7–7.7)
Neutrophils Relative %: 66 %
Platelets: 253 10*3/uL (ref 150–400)
RBC: 3.35 MIL/uL — ABNORMAL LOW (ref 3.87–5.11)
RDW: 13.6 % (ref 11.5–15.5)
WBC: 4.2 10*3/uL (ref 4.0–10.5)
nRBC: 0 % (ref 0.0–0.2)

## 2021-06-22 LAB — COMPREHENSIVE METABOLIC PANEL
ALT: 14 U/L (ref 0–44)
AST: 16 U/L (ref 15–41)
Albumin: 3.8 g/dL (ref 3.5–5.0)
Alkaline Phosphatase: 109 U/L (ref 38–126)
Anion gap: 7 (ref 5–15)
BUN: 14 mg/dL (ref 8–23)
CO2: 24 mmol/L (ref 22–32)
Calcium: 9.2 mg/dL (ref 8.9–10.3)
Chloride: 110 mmol/L (ref 98–111)
Creatinine, Ser: 0.82 mg/dL (ref 0.44–1.00)
GFR, Estimated: >60 mL/min (ref >60)
Glucose, Bld: 156 mg/dL — ABNORMAL HIGH (ref 70–99)
Potassium: 4.3 mmol/L (ref 3.5–5.1)
Sodium: 141 mmol/L (ref 135–145)
Total Bilirubin: 0.6 mg/dL (ref 0.3–1.2)
Total Protein: 7.6 g/dL (ref 6.5–8.1)

## 2021-06-22 NOTE — Progress Notes (Signed)
[place] [place]   [phone] [phone]   Clinic Follow up Note   Patient Care Team: [name], MD as PCP - General (Family Medicine) [name], MD as PCP - Cardiology (Cardiology) [name], MD as Consulting Physician (Hematology) [name], MD as Consulting Physician (General Surgery)  Date of Service:  06/22/2021  CHIEF COMPLAINT: f/u of metastatic breast cancer  CURRENT THERAPY:  PENDING [name], given on days 1 and 8 every 21 day cycle.  ASSESSMENT & PLAN:  Kristin Ward is a 65 y.o. female with   1. Left breast invasive ductal carcinoma, stage IB, p(T1cN0M0), Triple negative, Grade 3, in 2018, metastatic disease to right gluteal mass/iliac and diffuse node metastasis in 08/2020 ER 20% weakly + PR/HER2 (0) negative, PD-L1 0%, genetics (-) -She was initially diagnosed in 05/2017. She has high risk for recurrence due to triple negative disease. She is s/p left breast lumpectomy, adjuvant chemo TC and Radiation.  -Genetic testing was negative for pathogenetic mutations.  -Due to recent right gluteal pain, she underwent biopsy on 09/02/20 which showed metastatic carcinoma, consistent with breast primary. ER 20% positive, ER and HER2 negative.  -She started weekly Taxol on 09/26/20 for 3 weeks on/ 1 week off. Due to worsened neuropathy, Taxol stopped after C4 on 12/18/20. I switched her to Xeloda on 01/01/21 at 1559m in the AM and 20051min the PM 2 weeks on/1 week off. We increased to full dose Xeloda, 2000 mg BID, on 01/22/21. -Restaging CT CAP 06/18/21 showed worsening lymphadenopathy in retroperitoneal and right inguinal. Bone scan 06/20/21 was negative. I reviewed the results with her today. She also developed palpable right inguinal lymph nodes today. -We discussed switching chemo [place] her with printed information on the medicine. This will be given intravenously on days 1 and 8 of every 21 days cycle.  Fentanyl  discussed with her in detail, especially risk of cytopenias nausea, diarrhea, etc.  She voiced good understanding and she agrees to proceed.  -The goal of therapy is palliative, to prolong her life, and prevent cancer related symptom -plan to start in a week -I will also request foundation 1 on her biopsy,to see if she has any targetable mutation   2. Right hip pain, Right iliac bone met, Vit D deficiency -She had radiating right hip pain since 2020 from right iliac wing mass -She completed Palliative radiation to right iliac 09/04/20 - 09/22/20 with Dr. MoLisbeth Renshawand her pain is much improved.  -Given localized bone met, I recommended Zometa q3m12montho strengthen her bones and treatment for her bone mets. She received one dose on 11/20/20. She will receive a dose today. -vit D has remained low, most recently 27.18 on 04/17/21.   3. G2 peripheral Neuropathy  -S/p C2D8 Taxol she developed mild tingling in her hands and feet.  -She uses ice bags at home as needed.  -She continues to have moderate numbness in her hands and light tingling in her feet. She can take B12 or acupuncture.  -Taxol stopped after 12/18/20. Her neuropathy is improving.  -Overall stable   4. DM, HTN, COPD, retinitis pigmentosa with limited vision, Chronic combined systolic and diastolic heart failure, EF 45-50% -Continue to follow-up with PCP Dr LalRedmond Schoold cardiologist Dr HarEllyn Hackshe is legally blind -She recently tried insulin, but it made her sick, and she has stopped. She is on oral diabetic medicine and checking her sugars at home. BG overall improved since starting medication.  5. Goal of care discussion  -The patient understands the goal of care is palliative. -she is full code now      Plan -lab, flush, and Trodelvy in 1 and 2 weeks -virtual visit for toxicity check on 11/22   No problem-specific Assessment & Plan notes found for this encounter.   SUMMARY OF ONCOLOGIC HISTORY: Oncology History Overview Note   Cancer Staging Malignant neoplasm of lower-outer quadrant of left breast of female, estrogen receptor negative ([place]) Staging form: Breast, AJCC 8th Edition - Clinical stage from 06/06/2017: Stage IB (cT1c, cN0, cM0, G3, ER-, PR-, HER2-) - Signed by [name], MD on 06/15/2017 Nuclear grade: G3 Histologic grading system: 3 grade system - Pathologic stage from 06/26/2017: Stage IB (pT1c, pN0, cM0, G3, ER-, PR-, HER2-) - Signed by [name], MD on 07/10/2017 Neoadjuvant therapy: No Nuclear grade: G3 Multigene prognostic tests performed: None Histologic grading system: 3 grade system Laterality: Left     Malignant neoplasm of lower-outer quadrant of left breast of female, estrogen receptor negative ([place])  05/30/2017 Mammogram   Diagnostic mammo and US IMPRESSION: 1. Highly suspicious 1.4 cm mass in the slightly lower slightly outer left breast -tissue sampling recommended. 2. Indeterminate 0.5 mm mass in the slightly lower slightly outer left breast -tissue sampling recommended. 3. At least 2 left axillary lymph nodes with borderline cortical thickness.   06/06/2017 Initial Biopsy   Diagnosis 1. Breast, left, needle core biopsy, 5:30 o'clock - INVASIVE DUCTAL CARCINOMA, G3 2. Lymph node, needle/core biopsy, left axillary - NO CARCINOMA IDENTIFIED IN ONE LYMPH NODE (0/1)   06/06/2017 Initial Diagnosis   Malignant neoplasm of lower-outer quadrant of left breast of female, estrogen receptor negative ([place])   06/06/2017 Receptors her2   Estrogen Receptor: 0%, NEGATIVE Progesterone Receptor: 0%, NEGATIVE Proliferation Marker Ki67: 70%   06/26/2017 Surgery   LEFT BREAST LUMPECTOMY WITH RADIOACTIVE SEED AND SENTINEL LYMPH NODE BIOPSY ERAS  PATHWAY AND INSERTION PORT-A-CATH By Dr. Barry Dienes on 06/26/17    06/26/2017 Pathology Results   Diagnosis 06/26/17  1. Breast, lumpectomy, Left - INVASIVE DUCTAL CARCINOMA, GRADE III/III, SPANNING 1.2 CM. - THE SURGICAL RESECTION MARGINS ARE  NEGATIVE FOR CARCINOMA. - SEE ONCOLOGY TABLE BELOW. 2. Lymph node, sentinel, biopsy, Left axillary #1 - THERE IS NO EVIDENCE OF CARCINOMA IN 1 OF 1 LYMPH NODE (0/1). 3. Lymph node, sentinel, biopsy, Left axillary #2 - THERE IS NO EVIDENCE OF CARCINOMA IN 1 OF 1 LYMPH NODE (0/1). 4. Lymph node, sentinel, biopsy, Left axillary #3 - THERE IS NO EVIDENCE OF CARCINOMA IN 1 OF 1 LYMPH NODE (0/1).    07/25/2017 - 09/29/2017 Chemotherapy   Adjuvant cytoxan and docetaxel (TC) every 3 weekd for 4 cycles     11/19/2017 - 12/17/2017 Radiation Therapy   Adjuvant breast radiation Left breast treated to 42.5 Gy with 17 fx of 2.5 Gy followed by a boost of 7.5 Gy with 3 fx of 2.5 Gy      02/2019 Procedure   She had PAC removal in 02/2019.    07/23/2020 Imaging   MRI Lumbar Spine 07/23/20 IMPRESSION: 1. No significant disc herniation, spinal canal or neural foraminal stenosis at any level. 2. Moderate facet degenerative changes at L3-4, L4-5 and L5-S1. 3. Multiple enlarged retroperitoneal and right iliac lymph nodes. Recommend correlation with CT of the abdomen and pelvis with contrast.   08/15/2020 Imaging   CT AP 08/15/20  IMPRESSION: 1. Right iliac and periaortic adenopathy is noted concerning for metastatic disease or lymphoma. Also noted  is abnormal soft tissue mass anterior and posterior to the right iliac wing concerning for malignancy or metastatic disease. MRI is recommended for further evaluation. 2. Irregular lucency with sclerotic margins is seen involving the right iliac crest concerning for possible lytic lesion. MRI may be performed for further evaluation. 3. Enlarged fibroid uterus. 4. Small gallstone. 5. Aortic atherosclerosis.   08/22/2020 Pathology Results   FINAL MICROSCOPIC DIAGNOSIS: 08/22/20  A. NEEDLE CORE, RIGHT GLUTEUS MINIMUMUS, BIOPSY:  -  Metastatic carcinoma  -  See comment   COMMENT:  By immunohistochemistry, the neoplastic cells are positive for   cytokeratin-7 and have weak patchy positivity for GATA3 but are negative for cytokeratin-20 and GCDFP.  The combined morphology and immunophenotype, support metastasis from the patient's known breast primary carcinoma.   ADDENDUM:  PROGNOSTIC INDICATOR RESULTS:  The tumor cells are NEGATIVE for Her2 (0).  Estrogen Receptor:       POSITIVE, 20%, WEAK STAINING  Progesterone Receptor:   NEGATIVE   PD-L1 (0) negative    09/04/2020 - 09/22/2020 Radiation Therapy   Palliative radiation to right iliac 09/04/20 - 09/22/20, Dr. Lisbeth Renshaw   09/26/2020 -  Chemotherapy   first line weekly Taxol 3 weeks on/1 week off starting 09/26/20 --Reduced to 2 weeks on/1 week off starting with C3 due to neuropathy. Stopped after C4 on 12/18/20 due to neuratrophy.  --Switched to Xeloda on 01/01/21 at 1515m in the AM and 20024min the PM for 2 weeks on/1 week off     10/13/2020 Procedure    PAC placement on 10/13/20.    04/10/2021 Imaging   CT CAP  IMPRESSION: 1. Enlarging abdominopelvic retroperitoneal and right inguinal adenopathy, compatible with disease progression. 2. Lytic metastasis in the right iliac wing, similar. 3. Hepatomegaly. 4. Cholelithiasis. 5. Enlarged fibroid uterus. 6.  Aortic atherosclerosis (ICD10-I70.0).   04/23/2021 -  Chemotherapy    Patient is on Treatment Plan: BREAST METASTATIC ERIBULIN Q21D       06/18/2021 Imaging   CT CAP  IMPRESSION: 1. Today's study demonstrates progression of metastatic disease as evidence by increased number and size of numerous enlarged lymph nodes in the retroperitoneum, along the right pelvic sidewall, and in the upper right thigh. 2. Osseous metastases in the bony pelvis appear similar to the prior examination. No definite new osseous lesions are otherwise noted. 3. Fibroid uterus again noted. 4. Cholelithiasis without evidence of acute cholecystitis. 5. Aortic atherosclerosis, in addition to left anterior descending coronary artery disease. Please note  that although the presence of coronary artery calcium documents the presence of coronary artery disease, the severity of this disease and any potential stenosis cannot be assessed on this non-gated CT examination. Assessment for potential risk factor modification, dietary therapy or pharmacologic therapy may be warranted, if clinically indicated. 6. Additional incidental findings, as above.   06/20/2021 Imaging   Bone Scan  IMPRESSION: No osseous uptake suspicious for recurrent/progressive metastatic disease.      INTERVAL HISTORY:  Kristin Lonzoowens is here for a follow up of metastatic breast cancer. She was last seen by me on 05/28/21. She presents to the clinic alone. She was laying on the exam table when I came in today. She reports she felt a lump in her right side of her groin that she wanted me to examine.    All other systems were reviewed with the patient and are negative.  MEDICAL HISTORY:  Past Medical History:  Diagnosis Date   Abnormal x-ray of lungs with single pulmonary nodule  03/15/2016   per records from [place]    Anemia    Asthma    Cholelithiasis 05/19/2017   On CT   Coronary artery calcification seen on CAT scan 05/19/2017   Diabetes mellitus without complication ([place])    type 2   Dilated idiopathic cardiomyopathy ([place]) 12/2015   EF 15-20%. Diagnosed in [facility]   Headache    NONE RECENT   Hypertension    Lymph nodes enlarged 07/25/2020   Malignant neoplasm of lower-outer quadrant of left breast of female, estrogen receptor negative ([place]) 06/11/2017   left breast   Mixed hyperlipidemia 06/17/2018   partially blind    Personal history of chemotherapy 2018-2019   Personal history of radiation therapy 2019   Retinitis pigmentosa    Retroperitoneal lymphadenopathy 07/25/2020   Uveitis     SURGICAL HISTORY: Past Surgical History:  Procedure Laterality Date   brain cyst removed  2007   to help with headaches per patient ,  aspirated    BREAST BIOPSY Left 06/06/2017   x2   BREAST CYST ASPIRATION Left 06/06/2017   BREAST LUMPECTOMY Left 06/26/2017   BREAST LUMPECTOMY WITH RADIOACTIVE SEED AND SENTINEL LYMPH NODE BIOPSY Left 06/26/2017   Procedure: BREAST LUMPECTOMY WITH RADIOACTIVE SEED AND SENTINEL LYMPH NODE BIOPSY ERAS  PATHWAY;  Surgeon: [name], MD;  Location: [place];  Service: General;  Laterality: Left;  pec block   FINE NEEDLE ASPIRATION Left 02/23/2019   Procedure: FINE NEEDLE ASPIRATION;  Surgeon: [name], MD;  Location: [place];  Service: General;  Laterality: Left;  Asiration of left breast seroma    IR IMAGING GUIDED PORT INSERTION  10/13/2020   NASAL TURBINATE REDUCTION     PORT-A-CATH REMOVAL Right 02/23/2019   Procedure: REMOVAL PORT-A-CATH;  Surgeon: [name], MD;  Location: [place];  Service: General;  Laterality: Right;   PORTACATH PLACEMENT N/A 06/26/2017   Procedure: INSERTION PORT-A-CATH;  Surgeon: [name], MD;  Location: [place];  Service: General;  Laterality: N/A;   TRANSTHORACIC ECHOCARDIOGRAM  12/2015   A) [facility] May 2017: Mild concentric LVH. Global hypokinesis. GR daily. EF 18% severe LA dilation. Mitral annular dilatation with papillary muscle dysfunction and moderate MR. Dilated IVC consistent with elevated RAP.   TRANSTHORACIC ECHOCARDIOGRAM  05/2017; 08/2017   a) Mild concentric LVH.  EF 45 to 50% with diffuse hypokinesis.  GR 1 DD.  Mild aortic root dilation.;; b)  Mild LVH EF 45 to 50%.  Diffuse HK.  No significant valve disease.  No significant change.    I have reviewed the social history and family history with the patient and they are unchanged from previous note.  ALLERGIES:  is allergic to morphine and related, latex, tylenol with codeine #3 [acetaminophen-codeine], and penicillins.  MEDICATIONS:  Current Outpatient Medications  Medication Sig Dispense Refill   Accu-Chek FastClix Lancets MISC TEST TWICE  A DAY. PT USES AN ACCU-CHEK GUIDE ME METER 102 each 2   aspirin 81 MG chewable tablet Chew 81 mg by mouth daily.     carvedilol (COREG) 12.5 MG tablet TAKE 1 TABLET (12.5 MG TOTAL) BY MOUTH 2 (TWO) TIMES DAILY WITH A MEAL. 180 tablet 1   Cholecalciferol (D3-1000 PO) Take by mouth.     Continuous Blood Gluc Sensor (FREESTYLE LIBRE 14 DAY SENSOR) MISC 1 each by Does not apply route every 14 (fourteen) days. 3 each 4   diclofenac (VOLTAREN) 75 MG EC tablet Take 1 tablet (75 mg total) by  mouth 2 (two) times daily. 60 tablet 1   furosemide (LASIX) 20 MG tablet Take 20 mg by mouth daily. Patient is unsure of dose     glucose blood (ACCU-CHEK GUIDE) test strip Use as instructed to test blood sugar 2 times a day 100 strip 2   ibuprofen (ADVIL) 800 MG tablet Take 1 tablet (800 mg total) by mouth every 8 (eight) hours as needed. 30 tablet 2   lidocaine-prilocaine (EMLA) cream Apply 1 application topically as needed. 30 g 0   LINZESS 72 MCG capsule TAKE 1 CAPSULE BY MOUTH DAILY BEFORE BREAKFAST. 30 capsule 1   metFORMIN (GLUCOPHAGE) 500 MG tablet Take 1 tablet (500 mg total) by mouth 2 (two) times daily with a meal. 180 tablet 1   Multiple Vitamins-Minerals (WOMENS MULTI PO) Take by mouth.     RYBELSUS 3 MG TABS TAKE 1 TABLET BY MOUTH DAILY WITH BREAKFAST. 30 tablet 3   Saccharomyces boulardii (PROBIOTIC) 250 MG CAPS Take by mouth. (Patient not taking: Reported on 03/08/2021)     sacubitril-valsartan (ENTRESTO) 97-103 MG Take 1 tablet by mouth 2 (two) times daily. 180 tablet 3   spironolactone (ALDACTONE) 25 MG tablet Take 1 tablet (25 mg total) by mouth daily. 90 tablet 3   vitamin B-12 (CYANOCOBALAMIN) 500 MCG tablet Take 500 mcg by mouth daily.     vitamin C (ASCORBIC ACID) 500 MG tablet Take 1,000 mg 2 (two) times daily by mouth.     No current facility-administered medications for this visit.    PHYSICAL EXAMINATION: ECOG PERFORMANCE STATUS: 1 - Symptomatic but completely ambulatory  Vitals:    06/22/21 1253  BP: 134/79  Pulse: 76  Resp: 18  Temp: 98.2 F (36.8 C)  SpO2: 100%   Wt Readings from Last 3 Encounters:  06/22/21 233 lb 11.2 oz (106 kg)  05/28/21 229 lb 3.2 oz (104 kg)  05/07/21 222 lb 6.4 oz (100.9 kg)     GENERAL:alert, no distress and comfortable SKIN: skin color, texture, turgor are normal, no rashes or significant lesions EYES: normal, Conjunctiva are pink and non-injected, sclera clear  LYMPH:  no palpable lymphadenopathy in the cervical, axillary, (+) palpable inguinal lymph nodes ABDOMEN:abdomen soft, non-tender NEURO: alert & oriented x 3 with fluent speech, no focal motor/sensory deficits  LABORATORY DATA:  I have reviewed the data as listed CBC Latest Ref Rng & Units 06/22/2021 05/28/2021 05/07/2021  WBC 4.0 - 10.5 K/uL 4.2 4.6 4.9  Hemoglobin 12.0 - 15.0 g/dL 11.3(L) 11.7(L) 11.3(L)  Hematocrit 36.0 - 46.0 % 32.9(L) 34.0(L) 33.3(L)  Platelets 150 - 400 K/uL 253 236 282     CMP Latest Ref Rng & Units 06/22/2021 05/28/2021 05/07/2021  Glucose 70 - 99 mg/dL 156(H) 159(H) 129(H)  BUN 8 - 23 mg/dL '14 14 15  ' Creatinine 0.44 - 1.00 mg/dL 0.82 0.94 0.81  Sodium 135 - 145 mmol/L 141 142 141  Potassium 3.5 - 5.1 mmol/L 4.3 4.1 3.9  Chloride 98 - 111 mmol/L 110 109 109  CO2 22 - 32 mmol/L '24 24 24  ' Calcium 8.9 - 10.3 mg/dL 9.2 9.9 9.6  Total Protein 6.5 - 8.1 g/dL 7.6 7.9 7.6  Total Bilirubin 0.3 - 1.2 mg/dL 0.6 0.8 0.7  Alkaline Phos 38 - 126 U/L 109 104 90  AST 15 - 41 U/L '16 18 18  ' ALT 0 - 44 U/L '14 14 17      ' RADIOGRAPHIC STUDIES: I have personally reviewed the radiological images as listed and agreed with  the findings in the report. No results found.    No orders of the defined types were placed in this encounter.  All questions were answered. The patient knows to call the clinic with any problems, questions or concerns. No barriers to learning was detected. The total time spent in the appointment was 40 minutes.     [name],  MD 06/22/2021   I, Wilburn Mylar, am acting as scribe for [name], MD.   I have reviewed the above documentation for accuracy and completeness, and I agree with the above.

## 2021-06-22 NOTE — Progress Notes (Signed)
DISCONTINUE ON PATHWAY REGIMEN - Breast     A cycle is every 21 days:     Eribulin mesylate   **Always confirm dose/schedule in your pharmacy ordering system**  REASON: Other Reason PRIOR TREATMENT: [id number]: Eribulin 1.4 mg/m2 D1, 8 q21 Days TREATMENT RESPONSE: Unable to Evaluate  START ON PATHWAY REGIMEN - Breast     A cycle is every 21 days:     Sacituzumab govitecan-hziy   **Always confirm dose/schedule in your pharmacy ordering system**  Patient Characteristics: Distant Metastases or Locoregional Recurrent Disease - Unresected or Locally Advanced Unresectable Disease Progressing after Neoadjuvant and Local Therapies, HER2 Low/Negative/Unknown, ER Negative/Unknown, Chemotherapy, HER2 Negative/Unknown, Third Line  and Beyond, Prior or Contraindicated Anthracycline and Prior or Contraindicated Eribulin Therapeutic Status: Distant Metastases HER2 Status: Negative (-) ER Status: Negative (-) PR Status: Negative (-) Therapy Approach Indicated: Standard Chemotherapy/Endocrine Therapy Line of Therapy: Third Line and Beyond Intent of Therapy: Non-Curative / Palliative Intent, Discussed with Patient

## 2021-06-23 LAB — CANCER ANTIGEN 27.29: CA 27.29: 72 U/mL — ABNORMAL HIGH (ref 0.0–38.6)

## 2021-06-25 ENCOUNTER — Other Ambulatory Visit: Payer: Self-pay

## 2021-06-25 ENCOUNTER — Other Ambulatory Visit: Payer: Self-pay | Admitting: Internal Medicine

## 2021-06-25 ENCOUNTER — Ambulatory Visit (INDEPENDENT_AMBULATORY_CARE_PROVIDER_SITE_OTHER): Payer: Medicare Other | Admitting: Family Medicine

## 2021-06-25 ENCOUNTER — Other Ambulatory Visit: Payer: Self-pay | Admitting: Nurse Practitioner

## 2021-06-25 ENCOUNTER — Encounter: Payer: Self-pay | Admitting: Family Medicine

## 2021-06-25 VITALS — BP 130/86 | HR 75 | Temp 97.5°F | Wt 230.2 lb

## 2021-06-25 DIAGNOSIS — E119 Type 2 diabetes mellitus without complications: Secondary | ICD-10-CM

## 2021-06-25 DIAGNOSIS — I10 Essential (primary) hypertension: Secondary | ICD-10-CM | POA: Diagnosis not present

## 2021-06-25 DIAGNOSIS — E782 Mixed hyperlipidemia: Secondary | ICD-10-CM

## 2021-06-25 DIAGNOSIS — T466X5A Adverse effect of antihyperlipidemic and antiarteriosclerotic drugs, initial encounter: Secondary | ICD-10-CM

## 2021-06-25 DIAGNOSIS — H543 Unqualified visual loss, both eyes: Secondary | ICD-10-CM

## 2021-06-25 DIAGNOSIS — I2510 Atherosclerotic heart disease of native coronary artery without angina pectoris: Secondary | ICD-10-CM | POA: Diagnosis not present

## 2021-06-25 DIAGNOSIS — C7951 Secondary malignant neoplasm of bone: Secondary | ICD-10-CM

## 2021-06-25 DIAGNOSIS — Z171 Estrogen receptor negative status [ER-]: Secondary | ICD-10-CM

## 2021-06-25 DIAGNOSIS — C50512 Malignant neoplasm of lower-outer quadrant of left female breast: Secondary | ICD-10-CM

## 2021-06-25 DIAGNOSIS — G629 Polyneuropathy, unspecified: Secondary | ICD-10-CM

## 2021-06-25 DIAGNOSIS — G620 Drug-induced polyneuropathy: Secondary | ICD-10-CM | POA: Diagnosis not present

## 2021-06-25 MED ORDER — ATORVASTATIN CALCIUM 20 MG PO TABS: 20.0000 mg | ORAL_TABLET | Freq: Every day | ORAL | 3 refills | Status: DC

## 2021-06-25 NOTE — Progress Notes (Signed)
   Subjective:    Patient ID: Kristin Ward, female    DOB: 1956-08-11, 65 y.o.   MRN: [id number]  HPI She is here for an interval evaluation.  She is being cared for by oncology.  She does have metastatic breast disease and is in the process of being switched to a different protocol.  She does have underlying diabetes and her A1c in September did go down to 8.6.  She continues on Rybelsus as well as metformin.  Continues on Linzess to deal with her IBS symptoms.  She does have vitamin D deficiency.  Her most recent vitamin D level was slightly low.  She does have difficulty with simvastatin but is having no difficulty with atorvastatin.  She will need a refill on that.  She is on Entresto and therefore getting losartan with that.  She is blind in her vision is apparently 20/200.  Immunizations were discussed but she is not interested.  She does follow-up regularly with cardiology.  She seems to be handling all this fairly well.   Review of Systems     Objective:   Physical Exam Alert and in no distress otherwise not examined       Assessment & Plan:  Diabetes mellitus, new onset (HCC)  Simvastatin induced neuropathy (HCC)  Malignant neoplasm of lower-outer quadrant of left breast of female, estrogen receptor negative (HCC)  Mixed hyperlipidemia - Plan: atorvastatin (LIPITOR) 20 MG tablet  Blind in both eyes  Bone metastasis (HCC)  Coronary artery calcification seen on CAT scan  Essential hypertension At this point I would not readjust her diabetes medicines as she is getting ready to go on a different chemotherapy regimen.  Explained that mainly interested in maintaining adequate diabetes control and will work on this with more vigor in several months.  Did briefly discussed the use of freestyle libre.  May consider this with her next visit.  Recheck here in about 3 or 4 months.

## 2021-07-03 ENCOUNTER — Inpatient Hospital Stay: Payer: Medicare Other

## 2021-07-03 ENCOUNTER — Inpatient Hospital Stay (HOSPITAL_BASED_OUTPATIENT_CLINIC_OR_DEPARTMENT_OTHER): Payer: Medicare Other | Admitting: Hematology

## 2021-07-03 ENCOUNTER — Encounter: Payer: Self-pay | Admitting: Hematology

## 2021-07-03 ENCOUNTER — Other Ambulatory Visit: Payer: Self-pay

## 2021-07-03 ENCOUNTER — Other Ambulatory Visit: Payer: Medicare Other

## 2021-07-03 ENCOUNTER — Inpatient Hospital Stay: Payer: Medicare Other | Admitting: Hematology

## 2021-07-03 VITALS — BP 145/91 | HR 71 | Temp 97.7°F | Resp 18 | Wt 230.8 lb

## 2021-07-03 DIAGNOSIS — E119 Type 2 diabetes mellitus without complications: Secondary | ICD-10-CM | POA: Diagnosis not present

## 2021-07-03 DIAGNOSIS — C7951 Secondary malignant neoplasm of bone: Secondary | ICD-10-CM | POA: Diagnosis not present

## 2021-07-03 DIAGNOSIS — C50512 Malignant neoplasm of lower-outer quadrant of left female breast: Secondary | ICD-10-CM

## 2021-07-03 DIAGNOSIS — J449 Chronic obstructive pulmonary disease, unspecified: Secondary | ICD-10-CM | POA: Diagnosis not present

## 2021-07-03 DIAGNOSIS — Z171 Estrogen receptor negative status [ER-]: Secondary | ICD-10-CM

## 2021-07-03 DIAGNOSIS — G620 Drug-induced polyneuropathy: Secondary | ICD-10-CM | POA: Diagnosis not present

## 2021-07-03 DIAGNOSIS — I5042 Chronic combined systolic (congestive) and diastolic (congestive) heart failure: Secondary | ICD-10-CM | POA: Diagnosis not present

## 2021-07-03 DIAGNOSIS — Z5112 Encounter for antineoplastic immunotherapy: Secondary | ICD-10-CM | POA: Diagnosis not present

## 2021-07-03 DIAGNOSIS — E559 Vitamin D deficiency, unspecified: Secondary | ICD-10-CM | POA: Diagnosis not present

## 2021-07-03 DIAGNOSIS — Z7189 Other specified counseling: Secondary | ICD-10-CM

## 2021-07-03 DIAGNOSIS — I110 Hypertensive heart disease with heart failure: Secondary | ICD-10-CM | POA: Diagnosis not present

## 2021-07-03 LAB — COMPREHENSIVE METABOLIC PANEL
ALT: 12 U/L (ref 0–44)
AST: 17 U/L (ref 15–41)
Albumin: 3.9 g/dL (ref 3.5–5.0)
Alkaline Phosphatase: 108 U/L (ref 38–126)
Anion gap: 7 (ref 5–15)
BUN: 14 mg/dL (ref 8–23)
CO2: 25 mmol/L (ref 22–32)
Calcium: 9.2 mg/dL (ref 8.9–10.3)
Chloride: 110 mmol/L (ref 98–111)
Creatinine, Ser: 0.82 mg/dL (ref 0.44–1.00)
GFR, Estimated: >60 mL/min (ref >60)
Glucose, Bld: 126 mg/dL — ABNORMAL HIGH (ref 70–99)
Potassium: 3.9 mmol/L (ref 3.5–5.1)
Sodium: 142 mmol/L (ref 135–145)
Total Bilirubin: 0.5 mg/dL (ref 0.3–1.2)
Total Protein: 7.8 g/dL (ref 6.5–8.1)

## 2021-07-03 LAB — CBC WITH DIFFERENTIAL/PLATELET
Abs Immature Granulocytes: 0.03 10*3/uL (ref 0.00–0.07)
Basophils Absolute: 0 10*3/uL (ref 0.0–0.1)
Basophils Relative: 0 %
Eosinophils Absolute: 0.1 10*3/uL (ref 0.0–0.5)
Eosinophils Relative: 3 %
HCT: 33.5 % — ABNORMAL LOW (ref 36.0–46.0)
Hemoglobin: 11.3 g/dL — ABNORMAL LOW (ref 12.0–15.0)
Immature Granulocytes: 1 %
Lymphocytes Relative: 17 %
Lymphs Abs: 0.8 10*3/uL (ref 0.7–4.0)
MCH: 33 pg (ref 26.0–34.0)
MCHC: 33.7 g/dL (ref 30.0–36.0)
MCV: 98 fL (ref 80.0–100.0)
Monocytes Absolute: 0.4 10*3/uL (ref 0.1–1.0)
Monocytes Relative: 9 %
Neutro Abs: 3.1 10*3/uL (ref 1.7–7.7)
Neutrophils Relative %: 70 %
Platelets: 262 10*3/uL (ref 150–400)
RBC: 3.42 MIL/uL — ABNORMAL LOW (ref 3.87–5.11)
RDW: 13.3 % (ref 11.5–15.5)
WBC: 4.4 10*3/uL (ref 4.0–10.5)
nRBC: 0 % (ref 0.0–0.2)

## 2021-07-03 MED ORDER — FAMOTIDINE 20 MG IN NS 100 ML IVPB
20.0000 mg | Freq: Once | INTRAVENOUS | Status: AC
  Administered 2021-07-03: 20 mg via INTRAVENOUS
  Filled 2021-07-03: qty 100

## 2021-07-03 MED ORDER — PALONOSETRON HCL INJECTION 0.25 MG/5ML
0.2500 mg | Freq: Once | INTRAVENOUS | Status: AC
  Administered 2021-07-03: 0.25 mg via INTRAVENOUS
  Filled 2021-07-03: qty 5

## 2021-07-03 MED ORDER — SODIUM CHLORIDE 0.9% FLUSH
10.0000 mL | INTRAVENOUS | Status: DC | PRN
  Administered 2021-07-03: 10 mL

## 2021-07-03 MED ORDER — HEPARIN SOD (PORK) LOCK FLUSH 100 UNIT/ML IV SOLN
500.0000 [IU] | Freq: Once | INTRAVENOUS | Status: AC | PRN
  Administered 2021-07-03: 500 [IU]

## 2021-07-03 MED ORDER — SODIUM CHLORIDE 0.9 % IV SOLN
150.0000 mg | Freq: Once | INTRAVENOUS | Status: AC
  Administered 2021-07-03: 150 mg via INTRAVENOUS
  Filled 2021-07-03: qty 150

## 2021-07-03 MED ORDER — SODIUM CHLORIDE 0.9 % IV SOLN
10.0000 mg/kg | Freq: Once | INTRAVENOUS | Status: AC
  Administered 2021-07-03: 1080 mg via INTRAVENOUS
  Filled 2021-07-03: qty 108

## 2021-07-03 MED ORDER — ATROPINE SULFATE 1 MG/ML IV SOLN
0.5000 mg | Freq: Once | INTRAVENOUS | Status: DC | PRN
  Filled 2021-07-03: qty 1

## 2021-07-03 MED ORDER — SODIUM CHLORIDE 0.9 % IV SOLN: Freq: Once | INTRAVENOUS | Status: AC

## 2021-07-03 MED ORDER — DIPHENHYDRAMINE HCL 50 MG/ML IJ SOLN
50.0000 mg | Freq: Once | INTRAMUSCULAR | Status: AC
  Administered 2021-07-03: 50 mg via INTRAVENOUS
  Filled 2021-07-03: qty 1

## 2021-07-03 MED ORDER — SODIUM CHLORIDE 0.9 % IV SOLN
10.0000 mg | Freq: Once | INTRAVENOUS | Status: AC
  Administered 2021-07-03: 10 mg via INTRAVENOUS
  Filled 2021-07-03: qty 10

## 2021-07-03 MED ORDER — ACETAMINOPHEN 325 MG PO TABS
650.0000 mg | ORAL_TABLET | Freq: Once | ORAL | Status: AC
  Administered 2021-07-03: 650 mg via ORAL
  Filled 2021-07-03: qty 2

## 2021-07-03 NOTE — Patient Instructions (Signed)
[facility] ONCOLOGY  Discharge Instructions: Thank you for choosing [facility] to provide your oncology and hematology care.   If you have a lab appointment with the [facility], please go directly to the [facility] and check in at the registration area.   Wear comfortable clothing and clothing appropriate for easy access to any Portacath or PICC line.   We strive to give you quality time with your provider. You may need to reschedule your appointment if you arrive late (15 or more minutes).  Arriving late affects you and other patients whose appointments are after yours.  Also, if you miss three or more appointments without notifying the office, you may be dismissed from the clinic at the provider's discretion.      For prescription refill requests, have your pharmacy contact our office and allow 72 hours for refills to be completed.    Today you received the following chemotherapy and/or immunotherapy agents trodelvy      To help prevent nausea and vomiting after your treatment, we encourage you to take your nausea medication as directed.  BELOW ARE SYMPTOMS THAT SHOULD BE REPORTED IMMEDIATELY: *FEVER GREATER THAN 100.4 F (38 C) OR HIGHER *CHILLS OR SWEATING *NAUSEA AND VOMITING THAT IS NOT CONTROLLED WITH YOUR NAUSEA MEDICATION *UNUSUAL SHORTNESS OF BREATH *UNUSUAL BRUISING OR BLEEDING *URINARY PROBLEMS (pain or burning when urinating, or frequent urination) *BOWEL PROBLEMS (unusual diarrhea, constipation, pain near the anus) TENDERNESS IN MOUTH AND THROAT WITH OR WITHOUT PRESENCE OF ULCERS (sore throat, sores in mouth, or a toothache) UNUSUAL RASH, SWELLING OR PAIN  UNUSUAL VAGINAL DISCHARGE OR ITCHING   Items with * indicate a potential emergency and should be followed up as soon as possible or go to the Emergency Department if any problems should occur.  Please show the CHEMOTHERAPY ALERT CARD or IMMUNOTHERAPY ALERT CARD at check-in to  the Emergency Department and triage nurse.  Should you have questions after your visit or need to cancel or reschedule your appointment, please contact [facility]  Dept: [phone]  and follow the prompts.  Office hours are 8:00 a.m. to 4:30 p.m. Monday - Friday. Please note that voicemails left after 4:00 p.m. may not be returned until the following business day.  We are closed weekends and major holidays. You have access to a nurse at all times for urgent questions. Please call the main number to the clinic Dept: [phone] and follow the prompts.   For any non-urgent questions, you may also contact your provider using MyChart. We now offer e-Visits for anyone 67 and older to request care online for non-urgent symptoms. For details visit [email or web address].   Also download the MyChart app! Go to the app store, search "MyChart", open the app, select [facility], and log in with your MyChart username and password.  Due to Covid, a mask is required upon entering the hospital/clinic. If you do not have a mask, one will be given to you upon arrival. For doctor visits, patients may have 1 support person aged 63 or older with them. For treatment visits, patients cannot have anyone with them due to current Covid guidelines and our immunocompromised population.

## 2021-07-03 NOTE — Progress Notes (Signed)
Pt tolerated treatment without incident. Pt VSS after 30 min wait. Pt dc in stable condition.

## 2021-07-03 NOTE — Progress Notes (Signed)
[place]   [phone] [phone]   Clinic Follow up Note   Patient Care Team: [name], MD as PCP - General (Family Medicine) [name], MD as PCP - Cardiology (Cardiology) [name], MD as Consulting Physician (Hematology) [name], MD as Consulting Physician (General Surgery)  Date of Service:  07/03/2021  CHIEF COMPLAINT: f/u of metastatic breast cancer  CURRENT THERAPY:  [name], given on days 1 and 8 every 21 day cycle, starting 07/03/21  ASSESSMENT & PLAN:  Kristin Ward is a 65 y.o. female with   1. Left breast invasive ductal carcinoma, stage IB, p(T1cN0M0), Triple negative, Grade 3, in 2018, metastatic disease to right gluteal mass/iliac and diffuse node metastasis in 08/2020 ER 20% weakly + PR/HER2 (0) negative, PD-L1 0%, genetics (-) -She was initially diagnosed in 05/2017. She has high risk for recurrence due to triple negative disease. She is s/p left breast lumpectomy, adjuvant chemo TC and Radiation.  -Genetic testing was negative for pathogenetic mutations.  -Due to recent right gluteal pain, she underwent biopsy on 09/02/20 which showed metastatic carcinoma, consistent with breast primary. ER 20% positive, ER and HER2 negative.  -She started weekly Taxol on 09/26/20 for 3 weeks on/ 1 week off. Due to worsened neuropathy, Taxol stopped after C4 on 12/18/20. I switched her to Xeloda on 01/01/21 at 1542m in the AM and 20052min the PM 2 weeks on/1 week off. We increased to full dose Xeloda, 2000 mg BID, on 01/22/21. -Restaging CT CAP 06/18/21 showed worsening lymphadenopathy in retroperitoneal and right inguinal. Bone scan 06/20/21 was negative. I reviewed the results with her today. She also developed palpable right inguinal lymph nodes today. -we [place]. I reviewed potential AEs with her again   2. Right hip pain, Right iliac bone met, Vit D deficiency -She had radiating right hip pain  since 2020 from right iliac wing mass -She completed Palliative radiation to right iliac 09/04/20 - 09/22/20 with Dr. MoLisbeth Renshawand her pain is much improved.  -Given localized bone met, she started Zometa q3m24monthn 11/20/20.  -vit D has remained low, most recently 27.18 on 04/17/21.   3. G2 peripheral Neuropathy  -S/p C2D8 Taxol she developed mild tingling in her hands and feet.  -She uses ice bags at home as needed.  -She continues to have moderate numbness in her hands and light tingling in her feet. She can take B12 or acupuncture.  -Taxol stopped after 12/18/20. Her neuropathy is improving.  -Overall stable   4. DM, HTN, COPD, retinitis pigmentosa with limited vision, Chronic combined systolic and diastolic heart failure, EF 45-50% -Continue to follow-up with PCP Dr LalRedmond Schoold cardiologist Dr HarEllyn Hackshe is legally blind -She recently tried insulin, but it made her sick, and she has stopped. She is on oral diabetic medicine and checking her sugars at home. BG overall improved since starting medication.   5. Goal of care discussion  -The patient understands the goal of care is palliative. -she is full code now      Plan -lab, flush, and Trodelvy in 1, 3 and 4 weeks -f/u in 1 and 3 weeks    No problem-specific Assessment & Plan notes found for this encounter.   SUMMARY OF ONCOLOGIC HISTORY: Oncology History Overview Note  Cancer Staging Malignant neoplasm of lower-outer quadrant of left breast of female, estrogen receptor negative (HCCMainvilletaging form: Breast, AJCC 8th Edition - Clinical stage from 06/06/2017: Stage IB (  cT1c, cN0, cM0, G3, ER-, PR-, HER2-) - Signed by [name], MD on 06/15/2017 Nuclear grade: G3 Histologic grading system: 3 grade system - Pathologic stage from 06/26/2017: Stage IB (pT1c, pN0, cM0, G3, ER-, PR-, HER2-) - Signed by [name], MD on 07/10/2017 Neoadjuvant therapy: No Nuclear grade: G3 Multigene prognostic tests performed: None Histologic grading  system: 3 grade system Laterality: Left     Malignant neoplasm of lower-outer quadrant of left breast of female, estrogen receptor negative ([place])  05/30/2017 Mammogram   Diagnostic mammo and US IMPRESSION: 1. Highly suspicious 1.4 cm mass in the slightly lower slightly outer left breast -tissue sampling recommended. 2. Indeterminate 0.5 mm mass in the slightly lower slightly outer left breast -tissue sampling recommended. 3. At least 2 left axillary lymph nodes with borderline cortical thickness.   06/06/2017 Initial Biopsy   Diagnosis 1. Breast, left, needle core biopsy, 5:30 o'clock - INVASIVE DUCTAL CARCINOMA, G3 2. Lymph node, needle/core biopsy, left axillary - NO CARCINOMA IDENTIFIED IN ONE LYMPH NODE (0/1)   06/06/2017 Initial Diagnosis   Malignant neoplasm of lower-outer quadrant of left breast of female, estrogen receptor negative ([place])   06/06/2017 Receptors her2   Estrogen Receptor: 0%, NEGATIVE Progesterone Receptor: 0%, NEGATIVE Proliferation Marker Ki67: 70%   06/26/2017 Surgery   LEFT BREAST LUMPECTOMY WITH RADIOACTIVE SEED AND SENTINEL LYMPH NODE BIOPSY ERAS  PATHWAY AND INSERTION PORT-A-CATH By Dr. Barry Dienes on 06/26/17    06/26/2017 Pathology Results   Diagnosis 06/26/17  1. Breast, lumpectomy, Left - INVASIVE DUCTAL CARCINOMA, GRADE III/III, SPANNING 1.2 CM. - THE SURGICAL RESECTION MARGINS ARE NEGATIVE FOR CARCINOMA. - SEE ONCOLOGY TABLE BELOW. 2. Lymph node, sentinel, biopsy, Left axillary #1 - THERE IS NO EVIDENCE OF CARCINOMA IN 1 OF 1 LYMPH NODE (0/1). 3. Lymph node, sentinel, biopsy, Left axillary #2 - THERE IS NO EVIDENCE OF CARCINOMA IN 1 OF 1 LYMPH NODE (0/1). 4. Lymph node, sentinel, biopsy, Left axillary #3 - THERE IS NO EVIDENCE OF CARCINOMA IN 1 OF 1 LYMPH NODE (0/1).    07/25/2017 - 09/29/2017 Chemotherapy   Adjuvant cytoxan and docetaxel (TC) every 3 weekd for 4 cycles     11/19/2017 - 12/17/2017 Radiation Therapy   Adjuvant breast  radiation Left breast treated to 42.5 Gy with 17 fx of 2.5 Gy followed by a boost of 7.5 Gy with 3 fx of 2.5 Gy      02/2019 Procedure   She had PAC removal in 02/2019.    07/23/2020 Imaging   MRI Lumbar Spine 07/23/20 IMPRESSION: 1. No significant disc herniation, spinal canal or neural foraminal stenosis at any level. 2. Moderate facet degenerative changes at L3-4, L4-5 and L5-S1. 3. Multiple enlarged retroperitoneal and right iliac lymph nodes. Recommend correlation with CT of the abdomen and pelvis with contrast.   08/15/2020 Imaging   CT AP 08/15/20  IMPRESSION: 1. Right iliac and periaortic adenopathy is noted concerning for metastatic disease or lymphoma. Also noted is abnormal soft tissue mass anterior and posterior to the right iliac wing concerning for malignancy or metastatic disease. MRI is recommended for further evaluation. 2. Irregular lucency with sclerotic margins is seen involving the right iliac crest concerning for possible lytic lesion. MRI may be performed for further evaluation. 3. Enlarged fibroid uterus. 4. Small gallstone. 5. Aortic atherosclerosis.   08/22/2020 Pathology Results   FINAL MICROSCOPIC DIAGNOSIS: 08/22/20  A. NEEDLE CORE, RIGHT GLUTEUS MINIMUMUS, BIOPSY:  -  Metastatic carcinoma  -  See comment   COMMENT:  By  immunohistochemistry, the neoplastic cells are positive for  cytokeratin-7 and have weak patchy positivity for GATA3 but are negative for cytokeratin-20 and GCDFP.  The combined morphology and immunophenotype, support metastasis from the patient's known breast primary carcinoma.   ADDENDUM:  PROGNOSTIC INDICATOR RESULTS:  The tumor cells are NEGATIVE for Her2 (0).  Estrogen Receptor:       POSITIVE, 20%, WEAK STAINING  Progesterone Receptor:   NEGATIVE   PD-L1 (0) negative    09/04/2020 - 09/22/2020 Radiation Therapy   Palliative radiation to right iliac 09/04/20 - 09/22/20, Dr. Lisbeth Renshaw   09/26/2020 -  Chemotherapy   first line  weekly Taxol 3 weeks on/1 week off starting 09/26/20 --Reduced to 2 weeks on/1 week off starting with C3 due to neuropathy. Stopped after C4 on 12/18/20 due to neuratrophy.  --Switched to Xeloda on 01/01/21 at 1515m in the AM and 20025min the PM for 2 weeks on/1 week off     10/13/2020 Procedure    PAC placement on 10/13/20.    04/10/2021 Imaging   CT CAP  IMPRESSION: 1. Enlarging abdominopelvic retroperitoneal and right inguinal adenopathy, compatible with disease progression. 2. Lytic metastasis in the right iliac wing, similar. 3. Hepatomegaly. 4. Cholelithiasis. 5. Enlarged fibroid uterus. 6.  Aortic atherosclerosis (ICD10-I70.0).   06/18/2021 Imaging   CT CAP  IMPRESSION: 1. Today's study demonstrates progression of metastatic disease as evidence by increased number and size of numerous enlarged lymph nodes in the retroperitoneum, along the right pelvic sidewall, and in the upper right thigh. 2. Osseous metastases in the bony pelvis appear similar to the prior examination. No definite new osseous lesions are otherwise noted. 3. Fibroid uterus again noted. 4. Cholelithiasis without evidence of acute cholecystitis. 5. Aortic atherosclerosis, in addition to left anterior descending coronary artery disease. Please note that although the presence of coronary artery calcium documents the presence of coronary artery disease, the severity of this disease and any potential stenosis cannot be assessed on this non-gated CT examination. Assessment for potential risk factor modification, dietary therapy or pharmacologic therapy may be warranted, if clinically indicated. 6. Additional incidental findings, as above.   06/20/2021 Imaging   Bone Scan  IMPRESSION: No osseous uptake suspicious for recurrent/progressive metastatic disease.   07/03/2021 -  Chemotherapy   Patient is on Treatment Plan : BREAST METASTATIC Sacituzumab govitecan-hziy (TIvette Loyalq21d        INTERVAL HISTORY:   PaShahida Schnackenbergowens is here for a follow up of metastatic breast cancer. She was last seen by me on 06/22/21. She was seen in the infusion area. She reports doing well overall, with no new concerns since last visit  She reports her gait is off, which she attributes to the swelling in her feet. She continues to ambulate with a cane.   All other systems were reviewed with the patient and are negative.  MEDICAL HISTORY:  Past Medical History:  Diagnosis Date   Abnormal x-ray of lungs with single pulmonary nodule 03/15/2016   [place]  Anemia    Asthma    Cholelithiasis 05/19/2017   On CT   Coronary artery calcification seen on CAT scan 05/19/2017   Diabetes mellitus without complication ([place]   type 2   Dilated idiopathic cardiomyopathy ([facility]/2017   EF 15-20%. [facility]    NONE RECENT   Hypertension    Lymph nodes enlarged 07/25/2020   Malignant neoplasm of lower-outer quadrant of left breast of  female, estrogen receptor negative ([place]) 06/11/2017   left breast   Mixed hyperlipidemia 06/17/2018   partially blind    Personal history of chemotherapy 2018-2019   Personal history of radiation therapy 2019   Retinitis pigmentosa    Retroperitoneal lymphadenopathy 07/25/2020   Uveitis     SURGICAL HISTORY: Past Surgical History:  Procedure Laterality Date   brain cyst removed  2007   to help with headaches per patient , aspirated    BREAST BIOPSY Left 06/06/2017   x2   BREAST CYST ASPIRATION Left 06/06/2017   BREAST LUMPECTOMY Left 06/26/2017   BREAST LUMPECTOMY WITH RADIOACTIVE SEED AND SENTINEL LYMPH NODE BIOPSY Left 06/26/2017   Procedure: BREAST LUMPECTOMY WITH RADIOACTIVE SEED AND SENTINEL LYMPH NODE BIOPSY ERAS  PATHWAY;  Surgeon: [name], MD;  Location: [place];  Service: General;  Laterality: Left;  pec block   FINE NEEDLE ASPIRATION Left 02/23/2019   Procedure: FINE NEEDLE ASPIRATION;  Surgeon: [name], MD;  Location: [place];  Service: General;  Laterality: Left;  Asiration of left breast seroma    IR IMAGING GUIDED PORT INSERTION  10/13/2020   NASAL TURBINATE REDUCTION     PORT-A-CATH REMOVAL Right 02/23/2019   Procedure: REMOVAL PORT-A-CATH;  Surgeon: [name], MD;  Location: [place];  Service: General;  Laterality: Right;   PORTACATH PLACEMENT N/A 06/26/2017   Procedure: INSERTION PORT-A-CATH;  Surgeon: [name], MD;  Location: [place];  Service: General;  Laterality: N/A;   TRANSTHORACIC ECHOCARDIOGRAM  12/2015   A) [facility] May 2017: Mild concentric LVH. Global hypokinesis. GR daily. EF 18% severe LA dilation. Mitral annular dilatation with papillary muscle dysfunction and moderate MR. Dilated IVC consistent with elevated RAP.   TRANSTHORACIC ECHOCARDIOGRAM  05/2017; 08/2017   a) Mild concentric LVH.  EF 45 to 50% with diffuse hypokinesis.  GR 1 DD.  Mild aortic root dilation.;; b)  Mild LVH EF 45 to 50%.  Diffuse HK.  No significant valve disease.  No significant change.    I have reviewed the social history and family history with the patient and they are unchanged from previous note.  ALLERGIES:  is allergic to morphine and related, latex, tylenol with codeine #3 [acetaminophen-codeine], and penicillins.  MEDICATIONS:  Current Outpatient Medications  Medication Sig Dispense Refill   Accu-Chek FastClix Lancets MISC TEST TWICE A DAY. PT USES AN ACCU-CHEK GUIDE ME METER 102 each 2   aspirin 81 MG chewable tablet Chew 81 mg by mouth daily.     atorvastatin (LIPITOR) 20 MG tablet Take 1 tablet (20 mg total) by mouth daily. 90 tablet 3   carvedilol (COREG) 12.5 MG tablet TAKE 1 TABLET (12.5 MG TOTAL) BY MOUTH 2 (TWO) TIMES DAILY WITH A MEAL. 180 tablet 1   Cholecalciferol (D3-1000 PO) Take by mouth.     Continuous Blood Gluc Sensor (FREESTYLE LIBRE 14 DAY SENSOR) MISC 1 each by Does not apply route every 14 (fourteen) days.  (Patient not taking: Reported on 06/25/2021) 3 each 4   diclofenac (VOLTAREN) 75 MG EC tablet Take 1 tablet (75 mg total) by mouth 2 (two) times daily. (Patient not taking: Reported on 06/25/2021) 60 tablet 1   furosemide (LASIX) 20 MG tablet Take 20 mg by mouth daily. Patient is unsure of dose     glucose blood (ACCU-CHEK GUIDE) test strip Use as instructed to test blood sugar 2 times a day 100 strip 2   ibuprofen (ADVIL) 800 MG tablet Take 1  tablet (800 mg total) by mouth every 8 (eight) hours as needed. 30 tablet 2   lidocaine-prilocaine (EMLA) cream Apply 1 application topically as needed. 30 g 0   linaclotide (LINZESS) 72 MCG capsule Take 1 capsule (72 mcg total) by mouth daily before breakfast. MUST HAVE APPOINTMENT FOR REFILLS 30 capsule 1   metFORMIN (GLUCOPHAGE) 500 MG tablet Take 1 tablet (500 mg total) by mouth 2 (two) times daily with a meal. 180 tablet 1   Multiple Vitamins-Minerals (WOMENS MULTI PO) Take by mouth.     RYBELSUS 3 MG TABS TAKE 1 TABLET BY MOUTH DAILY WITH BREAKFAST. 30 tablet 3   Saccharomyces boulardii (PROBIOTIC) 250 MG CAPS Take by mouth. (Patient not taking: No sig reported)     sacubitril-valsartan (ENTRESTO) 97-103 MG Take 1 tablet by mouth 2 (two) times daily. 180 tablet 3   spironolactone (ALDACTONE) 25 MG tablet Take 1 tablet (25 mg total) by mouth daily. 90 tablet 3   vitamin B-12 (CYANOCOBALAMIN) 500 MCG tablet Take 500 mcg by mouth daily.     vitamin C (ASCORBIC ACID) 500 MG tablet Take 1,000 mg 2 (two) times daily by mouth.     No current facility-administered medications for this visit.    PHYSICAL EXAMINATION: ECOG PERFORMANCE STATUS: 2 - Symptomatic, <50% confined to bed  There were no vitals filed for this visit. Wt Readings from Last 3 Encounters:  07/03/21 230 lb 12 oz (104.7 kg)  06/25/21 230 lb 3.2 oz (104.4 kg)  06/22/21 233 lb 11.2 oz (106 kg)     GENERAL:alert, no distress and comfortable SKIN: skin color normal, no rashes or  significant lesions EYES: normal, Conjunctiva are pink and non-injected, sclera clear  NEURO: alert & oriented x 3 with fluent speech  LABORATORY DATA:  I have reviewed the data as listed CBC Latest Ref Rng & Units 07/03/2021 06/22/2021 05/28/2021  WBC 4.0 - 10.5 K/uL 4.4 4.2 4.6  Hemoglobin 12.0 - 15.0 g/dL 11.3(L) 11.3(L) 11.7(L)  Hematocrit 36.0 - 46.0 % 33.5(L) 32.9(L) 34.0(L)  Platelets 150 - 400 K/uL 262 253 236     CMP Latest Ref Rng & Units 07/03/2021 06/22/2021 05/28/2021  Glucose 70 - 99 mg/dL 126(H) 156(H) 159(H)  BUN 8 - 23 mg/dL _0 Creatinine 0.44 - 1.00 mg/dL 0.82 0.82 0.94  Sodium 135 - 145 mmol/L 142 141 142  Potassium 3.5 - 5.1 mmol/L 3.9 4.3 4.1  Chloride 98 - 111 mmol/L 110 110 109  CO2 22 - 32 mmol/L _1 Calcium 8.9 - 10.3 mg/dL 9.2 9.2 9.9  Total Protein 6.5 - 8.1 g/dL 7.8 7.6 7.9  Total Bilirubin 0.3 - 1.2 mg/dL 0.5 0.6 0.8  Alkaline Phos 38 - 126 U/L 108 109 104  AST 15 - 41 U/L _2 ALT 0 - 44 U/L _3 RADIOGRAPHIC STUDIES: I have personally reviewed the radiological images as listed and agreed with the findings in the report. No results found.    No orders of the defined types were placed in this encounter.  All questions were answered. The patient knows to call the clinic with any problems, questions or concerns. No barriers to learning was detected. The total time spent in the appointment was 30 minutes.     [name], MD 07/03/2021   I, Wilburn Mylar, am acting as scribe for [name], MD.   I have reviewed the above documentation for accuracy and completeness, and  I agree with the above.

## 2021-07-04 ENCOUNTER — Encounter: Payer: Self-pay | Admitting: Hematology

## 2021-07-05 IMAGING — PT NM PET TUM IMG INITIAL (PI) SKULL BASE T - THIGH
1 of 7 series · 1 of 25 positions shown · non-contrast
Comparison: CT abdomen pelvis 08/15/2020

CLINICAL DATA: Subsequent treatment strategy for breast carcinoma
with soft tissue metastasis.

EXAM:
NUCLEAR MEDICINE PET SKULL BASE TO THIGH
TECHNIQUE: 11.6 mCi F-18 FDG was injected intravenously. Full-ring PET imaging
was performed from the skull base to thigh after the radiotracer. CT
data was obtained and used for attenuation correction and anatomic
localization.
Fasting blood glucose: 90 mg/dl

[Series 4: ct sk_thigh 5.0 bf37 · axial · 5.0mm · 0.98mm/px · 1 of 230 slices shown]
[im 230/230  brain]
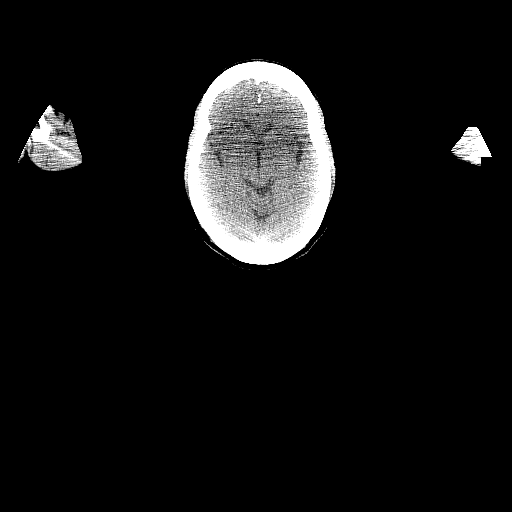

[1 of 25 positions shown; findings below may reference images not displayed]

FINDINGS: Mediastinal blood pool activity: SUV max

Liver activity: SUV max

NECK: No hypermetabolic lymph nodes in the neck.

Incidental CT findings: none

CHEST: Multiple small hypermetabolic lower mediastinal and hilar
lymph nodes. For example RIGHT lower paratracheal node SUV max equal
8.8. Subcarinal lymph node with SUV max equal 8.9. LEFT hilar lymph
node with SUV max equal 7.0.

Within the LEFT breast there is a low-density collection.
Superficial to this collection toward the skin surface is a focus of
metabolic activity adjacent to a surgical clip SUV max equal 4.1.

Incidental CT findings: Bibasilar atelectasis. Hypermetabolic or
suspicious pulmonary nodules.

ABDOMEN/PELVIS: No abnormal metabolic activity in the liver.

Hypermetabolic retrocrural node with SUV max equal 8.3 on image 107.

Hypermetabolic periaortic lymph nodes just above the bifurcation
between the aorta and IVC with SUV max equal 15.5. Nodes are
relatively small measuring approximately 10 mm short axis.

Hypermetabolic adenopathy extends along the RIGHT iliac chain.
Example node measures 16 mm with SUV max equal 10.0.

Hypermetabolic thickening of the iliacus muscle as well as the
gluteus muscle posterior to the RIGHT iliac wing. This combines to a
uniform mass spanning the RIGHT iliac wing measuring 7.3 x 8.9 cm
with SUV max equal 14.9.

No additional evidence of skeletal metastasis.

Incidental CT findings: Large lobular uterus without hypermetabolic

LEFT and RIGHT ovaries are hypermetabolic which is benign finding.

SKELETON: Metastatic lesion in the RIGHT iliac wing which extends
into the adjacent musculature as described above.

Incidental CT findings: none
IMPRESSION: 1. Hypermetabolic mass centered in the RIGHT iliac crest with
extension into the musculature anterior and posterior to the bone.
2. Hypermetabolic adenopathy along the RIGHT iliac chain extending
into the periaortic retroperitoneum about the bifurcation.
3. Hypermetabolic adenopathy in the retrocrural space.
4. Hypermetabolic mediastinal and hilar lymphadenopathy.
5. Small focus of metabolic activity in the LEFT breast is adjacent
to a surgical collection.

## 2021-07-06 ENCOUNTER — Telehealth: Payer: Self-pay | Admitting: Hematology

## 2021-07-06 NOTE — Telephone Encounter (Signed)
Left message with follow-up appointments per 11/22 los. 

## 2021-07-12 ENCOUNTER — Encounter: Payer: Self-pay | Admitting: Hematology

## 2021-07-12 ENCOUNTER — Inpatient Hospital Stay: Payer: Medicare Other

## 2021-07-12 ENCOUNTER — Inpatient Hospital Stay: Payer: Medicare Other | Attending: Hematology | Admitting: Hematology

## 2021-07-12 ENCOUNTER — Other Ambulatory Visit: Payer: Self-pay

## 2021-07-12 VITALS — BP 147/89 | HR 85 | Temp 98.2°F | Resp 17 | Wt 234.4 lb

## 2021-07-12 DIAGNOSIS — C778 Secondary and unspecified malignant neoplasm of lymph nodes of multiple regions: Secondary | ICD-10-CM | POA: Insufficient documentation

## 2021-07-12 DIAGNOSIS — Z9221 Personal history of antineoplastic chemotherapy: Secondary | ICD-10-CM | POA: Diagnosis not present

## 2021-07-12 DIAGNOSIS — Z170 Estrogen receptor positive status [ER+]: Secondary | ICD-10-CM | POA: Diagnosis not present

## 2021-07-12 DIAGNOSIS — H548 Legal blindness, as defined in USA: Secondary | ICD-10-CM | POA: Diagnosis not present

## 2021-07-12 DIAGNOSIS — Z923 Personal history of irradiation: Secondary | ICD-10-CM | POA: Insufficient documentation

## 2021-07-12 DIAGNOSIS — I5042 Chronic combined systolic (congestive) and diastolic (congestive) heart failure: Secondary | ICD-10-CM | POA: Diagnosis not present

## 2021-07-12 DIAGNOSIS — Z95828 Presence of other vascular implants and grafts: Secondary | ICD-10-CM

## 2021-07-12 DIAGNOSIS — I10 Essential (primary) hypertension: Secondary | ICD-10-CM | POA: Diagnosis not present

## 2021-07-12 DIAGNOSIS — E119 Type 2 diabetes mellitus without complications: Secondary | ICD-10-CM | POA: Diagnosis not present

## 2021-07-12 DIAGNOSIS — R509 Fever, unspecified: Secondary | ICD-10-CM | POA: Insufficient documentation

## 2021-07-12 DIAGNOSIS — H3552 Pigmentary retinal dystrophy: Secondary | ICD-10-CM | POA: Diagnosis not present

## 2021-07-12 DIAGNOSIS — C7989 Secondary malignant neoplasm of other specified sites: Secondary | ICD-10-CM | POA: Diagnosis not present

## 2021-07-12 DIAGNOSIS — Z5112 Encounter for antineoplastic immunotherapy: Secondary | ICD-10-CM | POA: Insufficient documentation

## 2021-07-12 DIAGNOSIS — G620 Drug-induced polyneuropathy: Secondary | ICD-10-CM | POA: Diagnosis not present

## 2021-07-12 DIAGNOSIS — T451X5A Adverse effect of antineoplastic and immunosuppressive drugs, initial encounter: Secondary | ICD-10-CM | POA: Insufficient documentation

## 2021-07-12 DIAGNOSIS — J101 Influenza due to other identified influenza virus with other respiratory manifestations: Secondary | ICD-10-CM | POA: Insufficient documentation

## 2021-07-12 DIAGNOSIS — C50512 Malignant neoplasm of lower-outer quadrant of left female breast: Secondary | ICD-10-CM | POA: Diagnosis not present

## 2021-07-12 DIAGNOSIS — M25551 Pain in right hip: Secondary | ICD-10-CM | POA: Insufficient documentation

## 2021-07-12 DIAGNOSIS — Z7984 Long term (current) use of oral hypoglycemic drugs: Secondary | ICD-10-CM | POA: Diagnosis not present

## 2021-07-12 DIAGNOSIS — Z1152 Encounter for screening for COVID-19: Secondary | ICD-10-CM | POA: Diagnosis not present

## 2021-07-12 DIAGNOSIS — J449 Chronic obstructive pulmonary disease, unspecified: Secondary | ICD-10-CM | POA: Diagnosis not present

## 2021-07-12 DIAGNOSIS — E559 Vitamin D deficiency, unspecified: Secondary | ICD-10-CM | POA: Insufficient documentation

## 2021-07-12 DIAGNOSIS — Z171 Estrogen receptor negative status [ER-]: Secondary | ICD-10-CM

## 2021-07-12 LAB — CBC WITH DIFFERENTIAL/PLATELET
Abs Immature Granulocytes: 0 10*3/uL (ref 0.00–0.07)
Basophils Absolute: 0 10*3/uL (ref 0.0–0.1)
Basophils Relative: 0 %
Eosinophils Absolute: 0.1 10*3/uL (ref 0.0–0.5)
Eosinophils Relative: 5 %
HCT: 29.5 % — ABNORMAL LOW (ref 36.0–46.0)
Hemoglobin: 10.1 g/dL — ABNORMAL LOW (ref 12.0–15.0)
Immature Granulocytes: 0 %
Lymphocytes Relative: 42 %
Lymphs Abs: 0.7 10*3/uL (ref 0.7–4.0)
MCH: 33 pg (ref 26.0–34.0)
MCHC: 34.2 g/dL (ref 30.0–36.0)
MCV: 96.4 fL (ref 80.0–100.0)
Monocytes Absolute: 0.1 10*3/uL (ref 0.1–1.0)
Monocytes Relative: 8 %
Neutro Abs: 0.7 10*3/uL — ABNORMAL LOW (ref 1.7–7.7)
Neutrophils Relative %: 45 %
Platelets: 206 10*3/uL (ref 150–400)
RBC: 3.06 MIL/uL — ABNORMAL LOW (ref 3.87–5.11)
RDW: 12.8 % (ref 11.5–15.5)
WBC: 1.7 10*3/uL — ABNORMAL LOW (ref 4.0–10.5)
nRBC: 0 % (ref 0.0–0.2)

## 2021-07-12 LAB — COMPREHENSIVE METABOLIC PANEL
ALT: 93 U/L — ABNORMAL HIGH (ref 0–44)
AST: 84 U/L — ABNORMAL HIGH (ref 15–41)
Albumin: 3.7 g/dL (ref 3.5–5.0)
Alkaline Phosphatase: 134 U/L — ABNORMAL HIGH (ref 38–126)
Anion gap: 9 (ref 5–15)
BUN: 10 mg/dL (ref 8–23)
CO2: 24 mmol/L (ref 22–32)
Calcium: 8.7 mg/dL — ABNORMAL LOW (ref 8.9–10.3)
Chloride: 111 mmol/L (ref 98–111)
Creatinine, Ser: 0.88 mg/dL (ref 0.44–1.00)
GFR, Estimated: >60 mL/min (ref >60)
Glucose, Bld: 174 mg/dL — ABNORMAL HIGH (ref 70–99)
Potassium: 3.8 mmol/L (ref 3.5–5.1)
Sodium: 144 mmol/L (ref 135–145)
Total Bilirubin: 0.5 mg/dL (ref 0.3–1.2)
Total Protein: 7.4 g/dL (ref 6.5–8.1)

## 2021-07-12 LAB — HEMOGLOBIN A1C
Hgb A1c MFr Bld: 6.8 % — ABNORMAL HIGH (ref 4.8–5.6)
Mean Plasma Glucose: 148.46 mg/dL

## 2021-07-12 LAB — VITAMIN D 25 HYDROXY (VIT D DEFICIENCY, FRACTURES): Vit D, 25-Hydroxy: 30.4 ng/mL (ref 30–100)

## 2021-07-12 MED ORDER — SODIUM CHLORIDE 0.9% FLUSH
10.0000 mL | INTRAVENOUS | Status: DC | PRN
  Administered 2021-07-12: 10 mL via INTRAVENOUS

## 2021-07-12 MED ORDER — HEPARIN SOD (PORK) LOCK FLUSH 100 UNIT/ML IV SOLN
500.0000 [IU] | Freq: Once | INTRAVENOUS | Status: AC | PRN
  Administered 2021-07-12: 500 [IU] via INTRAVENOUS

## 2021-07-12 MED FILL — Fosaprepitant Dimeglumine For IV Infusion 150 MG (Base Eq): INTRAVENOUS | Qty: 5 | Status: AC

## 2021-07-12 MED FILL — Dexamethasone Sodium Phosphate Inj 100 MG/10ML: INTRAMUSCULAR | Qty: 1 | Status: AC

## 2021-07-12 NOTE — Progress Notes (Signed)
Kristin Ward   [phone] [phone]   Clinic Follow up Note   Patient Care Team: [name], MD as PCP - General (Family Medicine) [name], MD as PCP - Cardiology (Cardiology) [name], MD as Consulting Physician (Hematology) [name], MD as Consulting Physician (General Surgery)  Date of Service:  07/12/2021  CHIEF COMPLAINT: f/u of metastatic breast cancer  CURRENT THERAPY:  Kristin Ward, given on days 1 and 8 every 21 day cycle, starting 07/03/21  ASSESSMENT & PLAN:  Kristin Ward is a 65 y.o. female with   1. Left breast invasive ductal carcinoma, stage IB, p(T1cN0M0), Triple negative, Grade 3, in 2018, metastatic disease to right gluteal mass/iliac and diffuse node metastasis in 08/2020 ER 20% weakly + PR/HER2 (0) negative, PD-L1 0%, genetics (-) -She was initially diagnosed in 05/2017. She has high risk for recurrence due to triple negative disease. She is s/p left breast lumpectomy, adjuvant chemo TC and Radiation.  -Genetic testing was negative for pathogenetic mutations.  -Due to recent right gluteal pain, she underwent biopsy on 09/02/20 which showed metastatic carcinoma, consistent with breast primary. ER 20% positive, ER and HER2 negative.  -She started weekly Taxol on 09/26/20 for 3 weeks on/ 1 week off. Due to worsened neuropathy, Taxol stopped after C4 on 12/18/20. I switched her to Xeloda on 01/01/21 at 1569m in the AM and 20047min the PM 2 weeks on/1 week off. We increased to full dose Xeloda, 2000 mg BID, on 01/22/21. -Restaging CT CAP 06/18/21 showed worsening lymphadenopathy in retroperitoneal and right inguinal. Bone scan 06/20/21 was negative. She also developed palpable right inguinal lymph nodes, confirmed on physical exam 06/22/21. -she switched to TrEubankn 07/03/21. She tolerated relatively well with 5 days of nausea and headaches. She notes she did not take antiemetics (she does not like taking pills) but  eventually recovered. We will try again with antiemetics this time and see how she tolerates.   2. Right hip pain, Right iliac bone met, Vit D deficiency -She had radiating right hip pain since 2020 from right iliac wing mass -She completed Palliative radiation to right iliac 09/04/20 - 09/22/20 with Dr. MoLisbeth Renshawand her pain is much improved.  -Given localized bone met, she started Zometa q3m87monthn 11/20/20.  -vit D has remained low, most recently 27.18 on 04/17/21.   3. G2 peripheral Neuropathy  -S/p C2D8 Taxol she developed mild tingling in her hands and feet.  -She uses ice bags at home as needed.  -She continues to have moderate numbness in her hands and light tingling in her feet. She can take B12 or acupuncture.  -Taxol stopped after 12/18/20. Her neuropathy is improving.  -Overall stable   4. DM, HTN, COPD, retinitis pigmentosa with limited vision, Chronic combined systolic and diastolic heart failure, EF 45-50% -Continue to follow-up with PCP Dr LalRedmond Schoold cardiologist Dr HarEllyn Hackshe is legally blind -She recently tried insulin, but it made her sick, and she has stopped. She is on oral diabetic medicine and checking her sugars at home. BG overall improved since starting medication.   5. Goal of care discussion  -The patient understands the goal of care is palliative. -she is full code now      Plan -She will return tomorrow for cycle 1 day 8 Trodelvy tomorrow  -lab, flush, and Trodelvy in 2 and 3 weeks -f/u in 2 weeks    No problem-specific Assessment & Plan notes found for this encounter.  SUMMARY OF ONCOLOGIC HISTORY: Oncology History Overview Note  Cancer Staging Malignant neoplasm of lower-outer quadrant of left breast of female, estrogen receptor negative ([place]) Staging form: Breast, AJCC 8th Edition - Clinical stage from 06/06/2017: Stage IB (cT1c, cN0, cM0, G3, ER-, PR-, HER2-) - Signed by [name], MD on 06/15/2017 Nuclear grade: G3 Histologic grading system: 3  grade system - Pathologic stage from 06/26/2017: Stage IB (pT1c, pN0, cM0, G3, ER-, PR-, HER2-) - Signed by [name], MD on 07/10/2017 Neoadjuvant therapy: No Nuclear grade: G3 Multigene prognostic tests performed: None Histologic grading system: 3 grade system Laterality: Left     Malignant neoplasm of lower-outer quadrant of left breast of female, estrogen receptor negative ([place])  05/30/2017 Mammogram   Diagnostic mammo and US IMPRESSION: 1. Highly suspicious 1.4 cm mass in the slightly lower slightly outer left breast -tissue sampling recommended. 2. Indeterminate 0.5 mm mass in the slightly lower slightly outer left breast -tissue sampling recommended. 3. At least 2 left axillary lymph nodes with borderline cortical thickness.   06/06/2017 Initial Biopsy   Diagnosis 1. Breast, left, needle core biopsy, 5:30 o'clock - INVASIVE DUCTAL CARCINOMA, G3 2. Lymph node, needle/core biopsy, left axillary - NO CARCINOMA IDENTIFIED IN ONE LYMPH NODE (0/1)   06/06/2017 Initial Diagnosis   Malignant neoplasm of lower-outer quadrant of left breast of female, estrogen receptor negative ([place])   06/06/2017 Receptors her2   Estrogen Receptor: 0%, NEGATIVE Progesterone Receptor: 0%, NEGATIVE Proliferation Marker Ki67: 70%   06/26/2017 Surgery   LEFT BREAST LUMPECTOMY WITH RADIOACTIVE SEED AND SENTINEL LYMPH NODE BIOPSY ERAS  PATHWAY AND INSERTION PORT-A-CATH By Dr. Barry Dienes on 06/26/17    06/26/2017 Pathology Results   Diagnosis 06/26/17  1. Breast, lumpectomy, Left - INVASIVE DUCTAL CARCINOMA, GRADE III/III, SPANNING 1.2 CM. - THE SURGICAL RESECTION MARGINS ARE NEGATIVE FOR CARCINOMA. - SEE ONCOLOGY TABLE BELOW. 2. Lymph node, sentinel, biopsy, Left axillary #1 - THERE IS NO EVIDENCE OF CARCINOMA IN 1 OF 1 LYMPH NODE (0/1). 3. Lymph node, sentinel, biopsy, Left axillary #2 - THERE IS NO EVIDENCE OF CARCINOMA IN 1 OF 1 LYMPH NODE (0/1). 4. Lymph node, sentinel, biopsy, Left axillary  #3 - THERE IS NO EVIDENCE OF CARCINOMA IN 1 OF 1 LYMPH NODE (0/1).    07/25/2017 - 09/29/2017 Chemotherapy   Adjuvant cytoxan and docetaxel (TC) every 3 weekd for 4 cycles     11/19/2017 - 12/17/2017 Radiation Therapy   Adjuvant breast radiation Left breast treated to 42.5 Gy with 17 fx of 2.5 Gy followed by a boost of 7.5 Gy with 3 fx of 2.5 Gy      02/2019 Procedure   She had PAC removal in 02/2019.    07/23/2020 Imaging   MRI Lumbar Spine 07/23/20 IMPRESSION: 1. No significant disc herniation, spinal canal or neural foraminal stenosis at any level. 2. Moderate facet degenerative changes at L3-4, L4-5 and L5-S1. 3. Multiple enlarged retroperitoneal and right iliac lymph nodes. Recommend correlation with CT of the abdomen and pelvis with contrast.   08/15/2020 Imaging   CT AP 08/15/20  IMPRESSION: 1. Right iliac and periaortic adenopathy is noted concerning for metastatic disease or lymphoma. Also noted is abnormal soft tissue mass anterior and posterior to the right iliac wing concerning for malignancy or metastatic disease. MRI is recommended for further evaluation. 2. Irregular lucency with sclerotic margins is seen involving the right iliac crest concerning for possible lytic lesion. MRI may be performed for further evaluation. 3. Enlarged fibroid uterus. 4. Small  gallstone. 5. Aortic atherosclerosis.   08/22/2020 Pathology Results   FINAL MICROSCOPIC DIAGNOSIS: 08/22/20  A. NEEDLE CORE, RIGHT GLUTEUS MINIMUMUS, BIOPSY:  -  Metastatic carcinoma  -  See comment   COMMENT:  By immunohistochemistry, the neoplastic cells are positive for  cytokeratin-7 and have weak patchy positivity for GATA3 but are negative for cytokeratin-20 and GCDFP.  The combined morphology and immunophenotype, support metastasis from the patient's known breast primary carcinoma.   ADDENDUM:  PROGNOSTIC INDICATOR RESULTS:  The tumor cells are NEGATIVE for Her2 (0).  Estrogen Receptor:        POSITIVE, 20%, WEAK STAINING  Progesterone Receptor:   NEGATIVE   PD-L1 (0) negative    09/04/2020 - 09/22/2020 Radiation Therapy   Palliative radiation to right iliac 09/04/20 - 09/22/20, Dr. Lisbeth Renshaw   09/26/2020 -  Chemotherapy   first line weekly Taxol 3 weeks on/1 week off starting 09/26/20 --Reduced to 2 weeks on/1 week off starting with C3 due to neuropathy. Stopped after C4 on 12/18/20 due to neuratrophy.  --Switched to Xeloda on 01/01/21 at 1567m in the AM and 20055min the PM for 2 weeks on/1 week off     10/13/2020 Procedure    PAC placement on 10/13/20.    04/10/2021 Imaging   CT CAP  IMPRESSION: 1. Enlarging abdominopelvic retroperitoneal and right inguinal adenopathy, compatible with disease progression. 2. Lytic metastasis in the right iliac wing, similar. 3. Hepatomegaly. 4. Cholelithiasis. 5. Enlarged fibroid uterus. 6.  Aortic atherosclerosis (ICD10-I70.0).   06/18/2021 Imaging   CT CAP  IMPRESSION: 1. Today's study demonstrates progression of metastatic disease as evidence by increased number and size of numerous enlarged lymph nodes in the retroperitoneum, along the right pelvic sidewall, and in the upper right thigh. 2. Osseous metastases in the bony pelvis appear similar to the prior examination. No definite new osseous lesions are otherwise noted. 3. Fibroid uterus again noted. 4. Cholelithiasis without evidence of acute cholecystitis. 5. Aortic atherosclerosis, in addition to left anterior descending coronary artery disease. Please note that although the presence of coronary artery calcium documents the presence of coronary artery disease, the severity of this disease and any potential stenosis cannot be assessed on this non-gated CT examination. Assessment for potential risk factor modification, dietary therapy or pharmacologic therapy may be warranted, if clinically indicated. 6. Additional incidental findings, as above.   06/20/2021 Imaging   Bone  Scan  IMPRESSION: No osseous uptake suspicious for recurrent/progressive metastatic disease.   07/03/2021 -  Chemotherapy   Patient is on Treatment Plan : BREAST METASTATIC Sacituzumab govitecan-hziy (TIvette Loyalq21d        INTERVAL HISTORY:  Kristin Ward is here for a follow up of metastatic breast cancer. She was last seen by me on 07/03/21. She presents to the clinic alone. She reports she recovered well from [place] she experienced nausea and headaches through Sunday (~6 days). She adds, however, that she did not take any antiemetics. She also had questions regarding metastatic cancer. I answered to the best of my ability.   All other systems were reviewed with the patient and are negative.  MEDICAL HISTORY:  Past Medical History:  Diagnosis Date   Abnormal x-ray of lungs with single pulmonary nodule 03/15/2016   [place]  Anemia    Asthma    Cholelithiasis 05/19/2017   On CT   Coronary artery calcification seen on CAT scan 05/19/2017   Diabetes mellitus without complication ([place]   type  2   Dilated idiopathic cardiomyopathy ([place]) 12/2015   EF 15-20%. Diagnosed in [facility]   Headache    NONE RECENT   Hypertension    Lymph nodes enlarged 07/25/2020   Malignant neoplasm of lower-outer quadrant of left breast of female, estrogen receptor negative ([place]) 06/11/2017   left breast   Mixed hyperlipidemia 06/17/2018   partially blind    Personal history of chemotherapy 2018-2019   Personal history of radiation therapy 2019   Retinitis pigmentosa    Retroperitoneal lymphadenopathy 07/25/2020   Uveitis     SURGICAL HISTORY: Past Surgical History:  Procedure Laterality Date   brain cyst removed  2007   to help with headaches per patient , aspirated    BREAST BIOPSY Left 06/06/2017   x2   BREAST CYST ASPIRATION Left 06/06/2017   BREAST LUMPECTOMY Left 06/26/2017   BREAST LUMPECTOMY WITH RADIOACTIVE SEED AND  SENTINEL LYMPH NODE BIOPSY Left 06/26/2017   Procedure: BREAST LUMPECTOMY WITH RADIOACTIVE SEED AND SENTINEL LYMPH NODE BIOPSY ERAS  PATHWAY;  Surgeon: [name], MD;  Location: [facility];  Service: General;  Laterality: Left;  pec block   FINE NEEDLE ASPIRATION Left 02/23/2019   Procedure: FINE NEEDLE ASPIRATION;  Surgeon: [name], MD;  Location: [place];  Service: General;  Laterality: Left;  Asiration of left breast seroma    IR IMAGING GUIDED PORT INSERTION  10/13/2020   NASAL TURBINATE REDUCTION     PORT-A-CATH REMOVAL Right 02/23/2019   Procedure: REMOVAL PORT-A-CATH;  Surgeon: [name], MD;  Location: [place];  Service: General;  Laterality: Right;   PORTACATH PLACEMENT N/A 06/26/2017   Procedure: INSERTION PORT-A-CATH;  Surgeon: [name], MD;  Location: [place];  Service: General;  Laterality: N/A;   TRANSTHORACIC ECHOCARDIOGRAM  12/2015   A) [facility] May 2017: Mild concentric LVH. Global hypokinesis. GR daily. EF 18% severe LA dilation. Mitral annular dilatation with papillary muscle dysfunction and moderate MR. Dilated IVC consistent with elevated RAP.   TRANSTHORACIC ECHOCARDIOGRAM  05/2017; 08/2017   a) Mild concentric LVH.  EF 45 to 50% with diffuse hypokinesis.  GR 1 DD.  Mild aortic root dilation.;; b)  Mild LVH EF 45 to 50%.  Diffuse HK.  No significant valve disease.  No significant change.    I have reviewed the social history and family history with the patient and they are unchanged from previous note.  ALLERGIES:  is allergic to morphine and related, latex, tylenol with codeine #3 [acetaminophen-codeine], and penicillins.  MEDICATIONS:  Current Outpatient Medications  Medication Sig Dispense Refill   Accu-Chek FastClix Lancets MISC TEST TWICE A DAY. PT USES AN ACCU-CHEK GUIDE ME METER 102 each 2   aspirin 81 MG chewable tablet Chew 81 mg by mouth daily.     atorvastatin (LIPITOR) 20 MG tablet Take 1 tablet (20 mg  total) by mouth daily. 90 tablet 3   carvedilol (COREG) 12.5 MG tablet TAKE 1 TABLET (12.5 MG TOTAL) BY MOUTH 2 (TWO) TIMES DAILY WITH A MEAL. 180 tablet 1   Cholecalciferol (D3-1000 PO) Take by mouth.     Continuous Blood Gluc Sensor (FREESTYLE LIBRE 14 DAY SENSOR) MISC 1 each by Does not apply route every 14 (fourteen) days. (Patient not taking: Reported on 06/25/2021) 3 each 4   diclofenac (VOLTAREN) 75 MG EC tablet Take 1 tablet (75 mg total) by mouth 2 (two) times daily. (Patient not taking: Reported on 06/25/2021) 60 tablet 1   furosemide (LASIX) 20 MG  tablet Take 20 mg by mouth daily. Patient is unsure of dose     glucose blood (ACCU-CHEK GUIDE) test strip Use as instructed to test blood sugar 2 times a day 100 strip 2   ibuprofen (ADVIL) 800 MG tablet Take 1 tablet (800 mg total) by mouth every 8 (eight) hours as needed. 30 tablet 2   lidocaine-prilocaine (EMLA) cream Apply 1 application topically as needed. 30 g 0   linaclotide (LINZESS) 72 MCG capsule Take 1 capsule (72 mcg total) by mouth daily before breakfast. MUST HAVE APPOINTMENT FOR REFILLS 30 capsule 1   metFORMIN (GLUCOPHAGE) 500 MG tablet Take 1 tablet (500 mg total) by mouth 2 (two) times daily with a meal. 180 tablet 1   Multiple Vitamins-Minerals (WOMENS MULTI PO) Take by mouth.     RYBELSUS 3 MG TABS TAKE 1 TABLET BY MOUTH DAILY WITH BREAKFAST. 30 tablet 3   Saccharomyces boulardii (PROBIOTIC) 250 MG CAPS Take by mouth. (Patient not taking: No sig reported)     sacubitril-valsartan (ENTRESTO) 97-103 MG Take 1 tablet by mouth 2 (two) times daily. 180 tablet 3   spironolactone (ALDACTONE) 25 MG tablet Take 1 tablet (25 mg total) by mouth daily. 90 tablet 3   vitamin B-12 (CYANOCOBALAMIN) 500 MCG tablet Take 500 mcg by mouth daily.     vitamin C (ASCORBIC ACID) 500 MG tablet Take 1,000 mg 2 (two) times daily by mouth.     No current facility-administered medications for this visit.    PHYSICAL EXAMINATION: ECOG PERFORMANCE  STATUS: 1 - Symptomatic but completely ambulatory  Vitals:   07/12/21 1239  BP: (!) 147/89  Pulse: 85  Resp: 17  Temp: 98.2 F (36.8 C)  SpO2: 97%   Wt Readings from Last 3 Encounters:  07/12/21 234 lb 7 oz (106.3 kg)  07/03/21 230 lb 12 oz (104.7 kg)  06/25/21 230 lb 3.2 oz (104.4 kg)     GENERAL:alert, no distress and comfortable SKIN: skin color normal, no rashes or significant lesions EYES: normal, Conjunctiva are pink and non-injected, sclera clear  NEURO: alert & oriented x 3 with fluent speech  LABORATORY DATA:  I have reviewed the data as listed CBC Latest Ref Rng & Units 07/12/2021 07/03/2021 06/22/2021  WBC 4.0 - 10.5 K/uL 1.7(L) 4.4 4.2  Hemoglobin 12.0 - 15.0 g/dL 10.1(L) 11.3(L) 11.3(L)  Hematocrit 36.0 - 46.0 % 29.5(L) 33.5(L) 32.9(L)  Platelets 150 - 400 K/uL 206 262 253     CMP Latest Ref Rng & Units 07/12/2021 07/03/2021 06/22/2021  Glucose 70 - 99 mg/dL 174(H) 126(H) 156(H)  BUN 8 - 23 mg/dL _0 Creatinine 0.44 - 1.00 mg/dL 0.88 0.82 0.82  Sodium 135 - 145 mmol/L 144 142 141  Potassium 3.5 - 5.1 mmol/L 3.8 3.9 4.3  Chloride 98 - 111 mmol/L 111 110 110  CO2 22 - 32 mmol/L _1 Calcium 8.9 - 10.3 mg/dL 8.7(L) 9.2 9.2  Total Protein 6.5 - 8.1 g/dL 7.4 7.8 7.6  Total Bilirubin 0.3 - 1.2 mg/dL 0.5 0.5 0.6  Alkaline Phos 38 - 126 U/L 134(H) 108 109  AST 15 - 41 U/L 84(H) 17 16  ALT 0 - 44 U/L 93(H) 12 14      RADIOGRAPHIC STUDIES: I have personally reviewed the radiological images as listed and agreed with the findings in the report. No results found.    No orders of the defined types were placed in this encounter.  All questions were answered. The  patient knows to call the clinic with any problems, questions or concerns. No barriers to learning was detected. The total time spent in the appointment was 30 minutes.     [name], MD 07/12/2021   I, Wilburn Mylar, am acting as scribe for [name], MD.   I have reviewed the above  documentation for accuracy and completeness, and I agree with the above.

## 2021-07-13 ENCOUNTER — Inpatient Hospital Stay: Payer: Medicare Other

## 2021-07-13 VITALS — BP 148/86 | HR 78 | Temp 98.3°F | Resp 18

## 2021-07-13 DIAGNOSIS — Z7984 Long term (current) use of oral hypoglycemic drugs: Secondary | ICD-10-CM | POA: Diagnosis not present

## 2021-07-13 DIAGNOSIS — C50512 Malignant neoplasm of lower-outer quadrant of left female breast: Secondary | ICD-10-CM

## 2021-07-13 DIAGNOSIS — T451X5A Adverse effect of antineoplastic and immunosuppressive drugs, initial encounter: Secondary | ICD-10-CM | POA: Diagnosis not present

## 2021-07-13 DIAGNOSIS — Z7189 Other specified counseling: Secondary | ICD-10-CM

## 2021-07-13 DIAGNOSIS — H548 Legal blindness, as defined in USA: Secondary | ICD-10-CM | POA: Diagnosis not present

## 2021-07-13 DIAGNOSIS — Z5112 Encounter for antineoplastic immunotherapy: Secondary | ICD-10-CM | POA: Diagnosis not present

## 2021-07-13 DIAGNOSIS — C7989 Secondary malignant neoplasm of other specified sites: Secondary | ICD-10-CM | POA: Diagnosis not present

## 2021-07-13 DIAGNOSIS — E559 Vitamin D deficiency, unspecified: Secondary | ICD-10-CM | POA: Diagnosis not present

## 2021-07-13 DIAGNOSIS — H3552 Pigmentary retinal dystrophy: Secondary | ICD-10-CM | POA: Diagnosis not present

## 2021-07-13 DIAGNOSIS — Z170 Estrogen receptor positive status [ER+]: Secondary | ICD-10-CM | POA: Diagnosis not present

## 2021-07-13 DIAGNOSIS — Z9221 Personal history of antineoplastic chemotherapy: Secondary | ICD-10-CM | POA: Diagnosis not present

## 2021-07-13 DIAGNOSIS — Z1152 Encounter for screening for COVID-19: Secondary | ICD-10-CM | POA: Diagnosis not present

## 2021-07-13 DIAGNOSIS — M25551 Pain in right hip: Secondary | ICD-10-CM | POA: Diagnosis not present

## 2021-07-13 DIAGNOSIS — Z923 Personal history of irradiation: Secondary | ICD-10-CM | POA: Diagnosis not present

## 2021-07-13 DIAGNOSIS — J449 Chronic obstructive pulmonary disease, unspecified: Secondary | ICD-10-CM | POA: Diagnosis not present

## 2021-07-13 DIAGNOSIS — C778 Secondary and unspecified malignant neoplasm of lymph nodes of multiple regions: Secondary | ICD-10-CM | POA: Diagnosis not present

## 2021-07-13 DIAGNOSIS — G620 Drug-induced polyneuropathy: Secondary | ICD-10-CM | POA: Diagnosis not present

## 2021-07-13 DIAGNOSIS — E119 Type 2 diabetes mellitus without complications: Secondary | ICD-10-CM | POA: Diagnosis not present

## 2021-07-13 DIAGNOSIS — R509 Fever, unspecified: Secondary | ICD-10-CM | POA: Diagnosis not present

## 2021-07-13 DIAGNOSIS — I5042 Chronic combined systolic (congestive) and diastolic (congestive) heart failure: Secondary | ICD-10-CM | POA: Diagnosis not present

## 2021-07-13 DIAGNOSIS — I10 Essential (primary) hypertension: Secondary | ICD-10-CM | POA: Diagnosis not present

## 2021-07-13 LAB — CANCER ANTIGEN 27.29: CA 27.29: 69.7 U/mL — ABNORMAL HIGH (ref 0.0–38.6)

## 2021-07-13 MED ORDER — DIPHENHYDRAMINE HCL 50 MG/ML IJ SOLN
50.0000 mg | Freq: Once | INTRAMUSCULAR | Status: AC
  Administered 2021-07-13: 50 mg via INTRAVENOUS
  Filled 2021-07-13: qty 1

## 2021-07-13 MED ORDER — SODIUM CHLORIDE 0.9 % IV SOLN
10.0000 mg/kg | Freq: Once | INTRAVENOUS | Status: AC
  Administered 2021-07-13: 1080 mg via INTRAVENOUS
  Filled 2021-07-13: qty 108

## 2021-07-13 MED ORDER — ATROPINE SULFATE 1 MG/ML IV SOLN
0.5000 mg | Freq: Once | INTRAVENOUS | Status: DC | PRN
  Filled 2021-07-13: qty 1

## 2021-07-13 MED ORDER — HEPARIN SOD (PORK) LOCK FLUSH 100 UNIT/ML IV SOLN
500.0000 [IU] | Freq: Once | INTRAVENOUS | Status: AC | PRN
  Administered 2021-07-13: 500 [IU]

## 2021-07-13 MED ORDER — FOSAPREPITANT DIMEGLUMINE INJECTION 150 MG
150.0000 mg | Freq: Once | INTRAVENOUS | Status: AC
  Administered 2021-07-13: 150 mg via INTRAVENOUS
  Filled 2021-07-13: qty 150

## 2021-07-13 MED ORDER — ACETAMINOPHEN 325 MG PO TABS
650.0000 mg | ORAL_TABLET | Freq: Once | ORAL | Status: AC
  Administered 2021-07-13: 650 mg via ORAL
  Filled 2021-07-13: qty 2

## 2021-07-13 MED ORDER — FAMOTIDINE 20 MG IN NS 100 ML IVPB
20.0000 mg | Freq: Once | INTRAVENOUS | Status: AC
  Administered 2021-07-13: 20 mg via INTRAVENOUS
  Filled 2021-07-13: qty 100

## 2021-07-13 MED ORDER — SODIUM CHLORIDE 0.9 % IV SOLN
10.0000 mg | Freq: Once | INTRAVENOUS | Status: AC
  Administered 2021-07-13: 10 mg via INTRAVENOUS
  Filled 2021-07-13: qty 10

## 2021-07-13 MED ORDER — SODIUM CHLORIDE 0.9% FLUSH
10.0000 mL | INTRAVENOUS | Status: DC | PRN
  Administered 2021-07-13: 10 mL

## 2021-07-13 MED ORDER — SODIUM CHLORIDE 0.9 % IV SOLN: Freq: Once | INTRAVENOUS | Status: AC

## 2021-07-13 MED ORDER — PALONOSETRON HCL INJECTION 0.25 MG/5ML
0.2500 mg | Freq: Once | INTRAVENOUS | Status: AC
  Administered 2021-07-13: 0.25 mg via INTRAVENOUS
  Filled 2021-07-13: qty 5

## 2021-07-13 NOTE — Progress Notes (Signed)
Ok to treat with yesterdays labs, ANC of 0.7, AST of 84 and ALT of 93 per Dr.Feng, will schedule injection appointments.  Pt stayed for 30 min observation and tolerated treatment well. Pt discharged stable, VSS, ambulatory to lobby.

## 2021-07-13 NOTE — Patient Instructions (Signed)
[facility] ONCOLOGY  Discharge Instructions: Thank you for choosing [facility] to provide your oncology and hematology care.   If you have a lab appointment with the [facility], please go directly to the [facility] and check in at the registration area.   Wear comfortable clothing and clothing appropriate for easy access to any Portacath or PICC line.   We strive to give you quality time with your provider. You may need to reschedule your appointment if you arrive late (15 or more minutes).  Arriving late affects you and other patients whose appointments are after yours.  Also, if you miss three or more appointments without notifying the office, you may be dismissed from the clinic at the provider's discretion.      For prescription refill requests, have your pharmacy contact our office and allow 72 hours for refills to be completed.    Today you received the following chemotherapy and/or immunotherapy agents trodelvy      To help prevent nausea and vomiting after your treatment, we encourage you to take your nausea medication as directed.  BELOW ARE SYMPTOMS THAT SHOULD BE REPORTED IMMEDIATELY: *FEVER GREATER THAN 100.4 F (38 C) OR HIGHER *CHILLS OR SWEATING *NAUSEA AND VOMITING THAT IS NOT CONTROLLED WITH YOUR NAUSEA MEDICATION *UNUSUAL SHORTNESS OF BREATH *UNUSUAL BRUISING OR BLEEDING *URINARY PROBLEMS (pain or burning when urinating, or frequent urination) *BOWEL PROBLEMS (unusual diarrhea, constipation, pain near the anus) TENDERNESS IN MOUTH AND THROAT WITH OR WITHOUT PRESENCE OF ULCERS (sore throat, sores in mouth, or a toothache) UNUSUAL RASH, SWELLING OR PAIN  UNUSUAL VAGINAL DISCHARGE OR ITCHING   Items with * indicate a potential emergency and should be followed up as soon as possible or go to the Emergency Department if any problems should occur.  Please show the CHEMOTHERAPY ALERT CARD or IMMUNOTHERAPY ALERT CARD at check-in to  the Emergency Department and triage nurse.  Should you have questions after your visit or need to cancel or reschedule your appointment, please contact [facility]  Dept: [phone]  and follow the prompts.  Office hours are 8:00 a.m. to 4:30 p.m. Monday - Friday. Please note that voicemails left after 4:00 p.m. may not be returned until the following business day.  We are closed weekends and major holidays. You have access to a nurse at all times for urgent questions. Please call the main number to the clinic Dept: [phone] and follow the prompts.   For any non-urgent questions, you may also contact your provider using MyChart. We now offer e-Visits for anyone 81 and older to request care online for non-urgent symptoms. For details visit [email or web address].   Also download the MyChart app! Go to the app store, search "MyChart", open the app, select [facility], and log in with your MyChart username and password.  Due to Covid, a mask is required upon entering the hospital/clinic. If you do not have a mask, one will be given to you upon arrival. For doctor visits, patients may have 1 support person aged 44 or older with them. For treatment visits, patients cannot have anyone with them due to current Covid guidelines and our immunocompromised population.

## 2021-07-14 ENCOUNTER — Other Ambulatory Visit: Payer: Self-pay

## 2021-07-14 ENCOUNTER — Inpatient Hospital Stay: Payer: Medicare Other

## 2021-07-14 VITALS — BP 123/80 | HR 90 | Temp 97.9°F | Resp 18

## 2021-07-14 DIAGNOSIS — J449 Chronic obstructive pulmonary disease, unspecified: Secondary | ICD-10-CM | POA: Diagnosis not present

## 2021-07-14 DIAGNOSIS — I5042 Chronic combined systolic (congestive) and diastolic (congestive) heart failure: Secondary | ICD-10-CM | POA: Diagnosis not present

## 2021-07-14 DIAGNOSIS — E559 Vitamin D deficiency, unspecified: Secondary | ICD-10-CM | POA: Diagnosis not present

## 2021-07-14 DIAGNOSIS — T451X5A Adverse effect of antineoplastic and immunosuppressive drugs, initial encounter: Secondary | ICD-10-CM | POA: Diagnosis not present

## 2021-07-14 DIAGNOSIS — Z5112 Encounter for antineoplastic immunotherapy: Secondary | ICD-10-CM | POA: Diagnosis not present

## 2021-07-14 DIAGNOSIS — H548 Legal blindness, as defined in USA: Secondary | ICD-10-CM | POA: Diagnosis not present

## 2021-07-14 DIAGNOSIS — C7989 Secondary malignant neoplasm of other specified sites: Secondary | ICD-10-CM | POA: Diagnosis not present

## 2021-07-14 DIAGNOSIS — Z170 Estrogen receptor positive status [ER+]: Secondary | ICD-10-CM | POA: Diagnosis not present

## 2021-07-14 DIAGNOSIS — Z7984 Long term (current) use of oral hypoglycemic drugs: Secondary | ICD-10-CM | POA: Diagnosis not present

## 2021-07-14 DIAGNOSIS — Z1152 Encounter for screening for COVID-19: Secondary | ICD-10-CM | POA: Diagnosis not present

## 2021-07-14 DIAGNOSIS — E119 Type 2 diabetes mellitus without complications: Secondary | ICD-10-CM | POA: Diagnosis not present

## 2021-07-14 DIAGNOSIS — M25551 Pain in right hip: Secondary | ICD-10-CM | POA: Diagnosis not present

## 2021-07-14 DIAGNOSIS — G620 Drug-induced polyneuropathy: Secondary | ICD-10-CM | POA: Diagnosis not present

## 2021-07-14 DIAGNOSIS — R509 Fever, unspecified: Secondary | ICD-10-CM | POA: Diagnosis not present

## 2021-07-14 DIAGNOSIS — I10 Essential (primary) hypertension: Secondary | ICD-10-CM | POA: Diagnosis not present

## 2021-07-14 DIAGNOSIS — Z95828 Presence of other vascular implants and grafts: Secondary | ICD-10-CM

## 2021-07-14 DIAGNOSIS — C50512 Malignant neoplasm of lower-outer quadrant of left female breast: Secondary | ICD-10-CM | POA: Diagnosis not present

## 2021-07-14 DIAGNOSIS — Z9221 Personal history of antineoplastic chemotherapy: Secondary | ICD-10-CM | POA: Diagnosis not present

## 2021-07-14 DIAGNOSIS — Z923 Personal history of irradiation: Secondary | ICD-10-CM | POA: Diagnosis not present

## 2021-07-14 DIAGNOSIS — H3552 Pigmentary retinal dystrophy: Secondary | ICD-10-CM | POA: Diagnosis not present

## 2021-07-14 DIAGNOSIS — C778 Secondary and unspecified malignant neoplasm of lymph nodes of multiple regions: Secondary | ICD-10-CM | POA: Diagnosis not present

## 2021-07-14 MED ORDER — FILGRASTIM-SNDZ 480 MCG/0.8ML IJ SOSY
480.0000 ug | PREFILLED_SYRINGE | Freq: Once | INTRAMUSCULAR | Status: AC
  Administered 2021-07-14: 480 ug via SUBCUTANEOUS

## 2021-07-14 MED ORDER — ALTEPLASE 2 MG IJ SOLR: 2.0000 mg | Freq: Once | INTRAMUSCULAR | Status: DC | PRN

## 2021-07-16 ENCOUNTER — Ambulatory Visit: Payer: Medicare Other

## 2021-07-16 ENCOUNTER — Telehealth: Payer: Self-pay | Admitting: Hematology

## 2021-07-16 NOTE — Telephone Encounter (Signed)
Scheduled follow-up appointments per 12/1 los. Patient is aware.

## 2021-07-17 ENCOUNTER — Inpatient Hospital Stay: Payer: Medicare Other

## 2021-07-19 ENCOUNTER — Other Ambulatory Visit: Payer: Self-pay | Admitting: Nurse Practitioner

## 2021-07-19 ENCOUNTER — Emergency Department (HOSPITAL_COMMUNITY)
Admission: EM | Admit: 2021-07-19 | Discharge: 2021-07-19 | Disposition: A | Discharge status: Home / Self Care | Payer: Medicare Other | Attending: Emergency Medicine | Admitting: Emergency Medicine

## 2021-07-19 ENCOUNTER — Encounter (HOSPITAL_COMMUNITY): Payer: Self-pay

## 2021-07-19 ENCOUNTER — Other Ambulatory Visit: Payer: Self-pay

## 2021-07-19 DIAGNOSIS — Z5321 Procedure and treatment not carried out due to patient leaving prior to being seen by health care provider: Secondary | ICD-10-CM | POA: Insufficient documentation

## 2021-07-19 DIAGNOSIS — R197 Diarrhea, unspecified: Secondary | ICD-10-CM | POA: Insufficient documentation

## 2021-07-19 NOTE — ED Provider Notes (Cosign Needed)
Emergency Medicine Provider Triage Evaluation Note  Kristin Ward , a 65 y.o. female  was evaluated in triage.  Pt complains of diarrhea since this morning.  She reports watery brown stool.  She denies fever, abdominal pain.   Review of Systems  Positive: Diarrhea Negative: Fever, abdominal pain  Physical Exam  BP (!) 166/103 (BP Location: Right Arm)   Pulse 91   Temp 98 F (36.7 C) (Oral)   Resp 18   SpO2 100%  Gen:   Awake, no distress   Resp:  Normal effort  MSK:   Moves extremities without difficulty  Other:    Medical Decision Making  Medically screening exam initiated at 11:00 PM.  Appropriate orders placed.  Sonny Masters Huitron was informed that the remainder of the evaluation will be completed by another provider, this initial triage assessment does not replace that evaluation, and the importance of remaining in the ED until their evaluation is complete.  Patient's voices concern regarding wait time.  Discussed given her degree of diarrhea concern would be electrolyte loss.  Patient states that she will go home and follow-up with cancer center in the morning and take Imodium has to have prescribed.  Return precautions discussed with patient at length and she voices understanding.    Evlyn Courier, PA-C 07/19/21 2302

## 2021-07-19 NOTE — ED Notes (Signed)
Patient refused labs and treatment because she did not want to wait. She states she will go to the cancer center in the morning.

## 2021-07-19 NOTE — ED Triage Notes (Signed)
Pt reports with diarrhea since today.

## 2021-07-20 ENCOUNTER — Encounter: Payer: Self-pay | Admitting: Hematology

## 2021-07-23 DIAGNOSIS — L84 Corns and callosities: Secondary | ICD-10-CM | POA: Diagnosis not present

## 2021-07-23 DIAGNOSIS — I739 Peripheral vascular disease, unspecified: Secondary | ICD-10-CM | POA: Diagnosis not present

## 2021-07-23 DIAGNOSIS — E1142 Type 2 diabetes mellitus with diabetic polyneuropathy: Secondary | ICD-10-CM | POA: Diagnosis not present

## 2021-07-23 DIAGNOSIS — L608 Other nail disorders: Secondary | ICD-10-CM | POA: Diagnosis not present

## 2021-07-23 DIAGNOSIS — B351 Tinea unguium: Secondary | ICD-10-CM | POA: Diagnosis not present

## 2021-07-25 ENCOUNTER — Other Ambulatory Visit: Payer: Self-pay | Admitting: Nurse Practitioner

## 2021-07-26 MED FILL — Dexamethasone Sodium Phosphate Inj 100 MG/10ML: INTRAMUSCULAR | Qty: 1 | Status: AC

## 2021-07-26 MED FILL — Fosaprepitant Dimeglumine For IV Infusion 150 MG (Base Eq): INTRAVENOUS | Qty: 5 | Status: AC

## 2021-07-27 ENCOUNTER — Encounter: Payer: Self-pay | Admitting: Hematology

## 2021-07-27 ENCOUNTER — Other Ambulatory Visit: Payer: Self-pay

## 2021-07-27 ENCOUNTER — Ambulatory Visit (HOSPITAL_COMMUNITY)
Admission: RE | Admit: 2021-07-27 | Discharge: 2021-07-27 | Disposition: A | Discharge status: Home / Self Care | Payer: Medicare Other | Source: Ambulatory Visit | Attending: Hematology | Admitting: Hematology

## 2021-07-27 ENCOUNTER — Inpatient Hospital Stay: Payer: Medicare Other

## 2021-07-27 ENCOUNTER — Other Ambulatory Visit: Payer: Self-pay | Admitting: Hematology

## 2021-07-27 ENCOUNTER — Other Ambulatory Visit: Payer: Self-pay | Admitting: Medical Oncology

## 2021-07-27 ENCOUNTER — Other Ambulatory Visit: Payer: Self-pay | Admitting: *Deleted

## 2021-07-27 ENCOUNTER — Inpatient Hospital Stay (HOSPITAL_BASED_OUTPATIENT_CLINIC_OR_DEPARTMENT_OTHER): Payer: Medicare Other | Admitting: Hematology

## 2021-07-27 VITALS — BP 128/93 | HR 118 | Temp 100.0°F | Resp 18 | Ht 67.0 in | Wt 225.3 lb

## 2021-07-27 DIAGNOSIS — H548 Legal blindness, as defined in USA: Secondary | ICD-10-CM | POA: Diagnosis not present

## 2021-07-27 DIAGNOSIS — U071 COVID-19: Secondary | ICD-10-CM | POA: Diagnosis not present

## 2021-07-27 DIAGNOSIS — I10 Essential (primary) hypertension: Secondary | ICD-10-CM | POA: Diagnosis not present

## 2021-07-27 DIAGNOSIS — Z171 Estrogen receptor negative status [ER-]: Secondary | ICD-10-CM | POA: Diagnosis not present

## 2021-07-27 DIAGNOSIS — Z170 Estrogen receptor positive status [ER+]: Secondary | ICD-10-CM | POA: Diagnosis not present

## 2021-07-27 DIAGNOSIS — J449 Chronic obstructive pulmonary disease, unspecified: Secondary | ICD-10-CM | POA: Diagnosis not present

## 2021-07-27 DIAGNOSIS — G620 Drug-induced polyneuropathy: Secondary | ICD-10-CM | POA: Diagnosis not present

## 2021-07-27 DIAGNOSIS — Z95828 Presence of other vascular implants and grafts: Secondary | ICD-10-CM

## 2021-07-27 DIAGNOSIS — R509 Fever, unspecified: Secondary | ICD-10-CM

## 2021-07-27 DIAGNOSIS — H3552 Pigmentary retinal dystrophy: Secondary | ICD-10-CM | POA: Diagnosis not present

## 2021-07-27 DIAGNOSIS — R059 Cough, unspecified: Secondary | ICD-10-CM | POA: Diagnosis not present

## 2021-07-27 DIAGNOSIS — C50512 Malignant neoplasm of lower-outer quadrant of left female breast: Secondary | ICD-10-CM | POA: Diagnosis not present

## 2021-07-27 DIAGNOSIS — Z853 Personal history of malignant neoplasm of breast: Secondary | ICD-10-CM | POA: Diagnosis not present

## 2021-07-27 DIAGNOSIS — C7989 Secondary malignant neoplasm of other specified sites: Secondary | ICD-10-CM | POA: Diagnosis not present

## 2021-07-27 DIAGNOSIS — Z7984 Long term (current) use of oral hypoglycemic drugs: Secondary | ICD-10-CM | POA: Diagnosis not present

## 2021-07-27 DIAGNOSIS — M25551 Pain in right hip: Secondary | ICD-10-CM | POA: Diagnosis not present

## 2021-07-27 DIAGNOSIS — Z9221 Personal history of antineoplastic chemotherapy: Secondary | ICD-10-CM | POA: Diagnosis not present

## 2021-07-27 DIAGNOSIS — T451X5A Adverse effect of antineoplastic and immunosuppressive drugs, initial encounter: Secondary | ICD-10-CM | POA: Diagnosis not present

## 2021-07-27 DIAGNOSIS — C778 Secondary and unspecified malignant neoplasm of lymph nodes of multiple regions: Secondary | ICD-10-CM | POA: Diagnosis not present

## 2021-07-27 DIAGNOSIS — Z5112 Encounter for antineoplastic immunotherapy: Secondary | ICD-10-CM | POA: Diagnosis not present

## 2021-07-27 DIAGNOSIS — Z1152 Encounter for screening for COVID-19: Secondary | ICD-10-CM | POA: Diagnosis not present

## 2021-07-27 DIAGNOSIS — E119 Type 2 diabetes mellitus without complications: Secondary | ICD-10-CM | POA: Diagnosis not present

## 2021-07-27 DIAGNOSIS — I5042 Chronic combined systolic (congestive) and diastolic (congestive) heart failure: Secondary | ICD-10-CM | POA: Diagnosis not present

## 2021-07-27 DIAGNOSIS — Z923 Personal history of irradiation: Secondary | ICD-10-CM | POA: Diagnosis not present

## 2021-07-27 DIAGNOSIS — E559 Vitamin D deficiency, unspecified: Secondary | ICD-10-CM | POA: Diagnosis not present

## 2021-07-27 LAB — CBC WITH DIFFERENTIAL/PLATELET
Abs Immature Granulocytes: 0.04 10*3/uL (ref 0.00–0.07)
Basophils Absolute: 0 10*3/uL (ref 0.0–0.1)
Basophils Relative: 0 %
Eosinophils Absolute: 0 10*3/uL (ref 0.0–0.5)
Eosinophils Relative: 1 %
HCT: 31.9 % — ABNORMAL LOW (ref 36.0–46.0)
Hemoglobin: 10.7 g/dL — ABNORMAL LOW (ref 12.0–15.0)
Immature Granulocytes: 1 %
Lymphocytes Relative: 8 %
Lymphs Abs: 0.4 10*3/uL — ABNORMAL LOW (ref 0.7–4.0)
MCH: 31.5 pg (ref 26.0–34.0)
MCHC: 33.5 g/dL (ref 30.0–36.0)
MCV: 93.8 fL (ref 80.0–100.0)
Monocytes Absolute: 0.7 10*3/uL (ref 0.1–1.0)
Monocytes Relative: 13 %
Neutro Abs: 3.9 10*3/uL (ref 1.7–7.7)
Neutrophils Relative %: 77 %
Platelets: 355 10*3/uL (ref 150–400)
RBC: 3.4 MIL/uL — ABNORMAL LOW (ref 3.87–5.11)
RDW: 13.1 % (ref 11.5–15.5)
WBC: 5 10*3/uL (ref 4.0–10.5)
nRBC: 0 % (ref 0.0–0.2)

## 2021-07-27 LAB — COMPREHENSIVE METABOLIC PANEL
ALT: 27 U/L (ref 0–44)
AST: 24 U/L (ref 15–41)
Albumin: 3.8 g/dL (ref 3.5–5.0)
Alkaline Phosphatase: 113 U/L (ref 38–126)
Anion gap: 9 (ref 5–15)
BUN: 9 mg/dL (ref 8–23)
CO2: 24 mmol/L (ref 22–32)
Calcium: 9.2 mg/dL (ref 8.9–10.3)
Chloride: 102 mmol/L (ref 98–111)
Creatinine, Ser: 0.82 mg/dL (ref 0.44–1.00)
GFR, Estimated: >60 mL/min (ref >60)
Glucose, Bld: 126 mg/dL — ABNORMAL HIGH (ref 70–99)
Potassium: 3.9 mmol/L (ref 3.5–5.1)
Sodium: 135 mmol/L (ref 135–145)
Total Bilirubin: 0.5 mg/dL (ref 0.3–1.2)
Total Protein: 7.7 g/dL (ref 6.5–8.1)

## 2021-07-27 LAB — URINALYSIS, COMPLETE (UACMP) WITH MICROSCOPIC
Bilirubin Urine: NEGATIVE
Glucose, UA: NEGATIVE mg/dL
Ketones, ur: 5 mg/dL — AB
Leukocytes,Ua: NEGATIVE
Nitrite: NEGATIVE
Protein, ur: NEGATIVE mg/dL
Specific Gravity, Urine: 1.02 (ref 1.005–1.030)
pH: 6 (ref 5.0–8.0)

## 2021-07-27 LAB — RESP PANEL BY RT-PCR (FLU A&B, COVID) ARPGX2
Influenza A by PCR: POSITIVE — AB
Influenza B by PCR: NEGATIVE
SARS Coronavirus 2 by RT PCR: NEGATIVE

## 2021-07-27 MED ORDER — LEVOFLOXACIN 500 MG PO TABS: 500.0000 mg | ORAL_TABLET | Freq: Every day | ORAL | 0 refills | Status: DC

## 2021-07-27 MED ORDER — SODIUM CHLORIDE 0.9 % IV SOLN: INTRAVENOUS | Status: DC

## 2021-07-27 MED ORDER — ACETAMINOPHEN 325 MG PO TABS
650.0000 mg | ORAL_TABLET | Freq: Once | ORAL | Status: AC
  Administered 2021-07-27: 650 mg via ORAL

## 2021-07-27 MED ORDER — OSELTAMIVIR PHOSPHATE 75 MG PO CAPS: 75.0000 mg | ORAL_CAPSULE | Freq: Two times a day (BID) | ORAL | 0 refills | Status: DC

## 2021-07-27 MED ORDER — SODIUM CHLORIDE 0.9% FLUSH
10.0000 mL | INTRAVENOUS | Status: DC | PRN
  Administered 2021-07-27: 10 mL via INTRAVENOUS

## 2021-07-27 MED ORDER — SODIUM CHLORIDE 0.9 % IV SOLN
2.0000 g | Freq: Once | INTRAVENOUS | Status: AC
  Administered 2021-07-27: 2 g via INTRAVENOUS
  Filled 2021-07-27: qty 2

## 2021-07-27 NOTE — Progress Notes (Addendum)
Kristin Ward   [phone]) [phone] Fax:([phone]) [phone]   Clinic Follow up Note   Patient Care Team: [name], MD as PCP - General (Family Medicine) [name], MD as PCP - Cardiology (Cardiology) [name], MD as Consulting Physician (Hematology) [name], MD as Consulting Physician (General Surgery)  Date of Service:  07/27/2021  CHIEF COMPLAINT: f/u of metastatic breast cancer  CURRENT THERAPY:  Kristin Ward, given on days 1 and 8 every 21 day cycle, starting 07/03/21  ASSESSMENT & PLAN:  Kristin Ward is a 65 y.o. female with   1. Symptom Management: fever, headache, vomiting, diarrhea, fatigue, cough -she presented today with a sinus headache and fever of 100F. She also reports fatigue, vomiting, and several days of cough (which has improved). -she requested IVF due to becoming dehydrated from the vomiting. -I will also order blood culture, UA, and chest x-ray to be done today to rule out infection  -nasal swab for flu and COVID today in clinic -I will give one dose cefepime today and call in Levaquin to her pharmacy today  2. Left breast invasive ductal carcinoma, stage IB, p(T1cN0M0), Triple negative, Grade 3, in 2018, metastatic disease to right gluteal mass/iliac and diffuse node metastasis in 08/2020 ER 20% weakly + PR/HER2 (0) negative, PD-L1 0%, genetics (-) -She was initially diagnosed in 05/2017. She has high risk for recurrence due to triple negative disease. She is s/p left breast lumpectomy, adjuvant chemo TC and Radiation.  -Genetic testing was negative for pathogenetic mutations.  -Due to recent right gluteal pain, she underwent biopsy on 09/02/20 which showed metastatic carcinoma, consistent with breast primary. ER 20% positive, ER and HER2 negative.  -She started weekly Taxol on 09/26/20 for 3 weeks on/ 1 week off. Due to worsened neuropathy, Taxol stopped after C4 on 12/18/20. I switched her to Xeloda on 01/01/21 at 1579m in the AM  and 20041min the PM 2 weeks on/1 week off. We increased to full dose Xeloda, 2000 mg BID, on 01/22/21. -Restaging CT CAP 06/18/21 showed worsening lymphadenopathy in retroperitoneal and right inguinal. Bone scan 06/20/21 was negative. She also developed palpable right inguinal lymph nodes, confirmed on physical exam 06/22/21. -she switched to [place] 07/03/21. She again experienced nausea as well as severe diarrhea after cycle 2. We will hold chemo today given #1   3. Right hip pain, Right iliac bone met, Vit D deficiency -She had radiating right hip pain since 2020 from right iliac wing mass -She completed Palliative radiation to right iliac 09/04/20 - 09/22/20 with Dr. MoLisbeth Renshawand her pain is much improved.  -Given localized bone met, she started Zometa q3m41monthn 11/20/20.  -vit D has remained low, most recently 27.18 on 04/17/21.   4. G2 peripheral Neuropathy  -S/p C2D8 Taxol she developed mild tingling in her hands and feet.  -She uses ice bags at home as needed.  -She continues to have moderate numbness in her hands and light tingling in her feet. She can take B12 or acupuncture.  -Taxol stopped after 12/18/20. Her neuropathy is improving.  -Overall stable   5. DM, HTN, COPD, retinitis pigmentosa with limited vision, Chronic combined systolic and diastolic heart failure, EF 45-50% -Continue to follow-up with PCP Dr LalRedmond Schoold cardiologist Dr HarEllyn Hackshe is legally blind -She recently tried insulin, but it made her sick, and she has stopped. She is on oral diabetic medicine and checking her sugars at home. BG overall improved since starting medication.  6. Goal of care discussion  -The patient understands the goal of care is palliative. -she is full code now      Plan -hold chemo today -return to lab for blood culture and UA, nasal swab  -chest x-ray today -IVF and one dose Cefepime  -f/u next week if needed, otherwise in 2 weeks with next chemo    Addendum -test came back (+)  for influenza A, I called in Tamiflu 66m bid for 5 days and cancelled levaquin.   YTruitt Merle  No problem-specific Assessment & Plan notes found for this encounter.   SUMMARY OF ONCOLOGIC HISTORY: Oncology History Overview Note  Cancer Staging Malignant neoplasm of lower-outer quadrant of left breast of female, estrogen receptor negative ([place] Staging form: Breast, AJCC 8th Edition - Clinical stage from 06/06/2017: Stage IB (cT1c, cN0, cM0, G3, ER-, PR-, HER2-) - Signed by FTruitt Merle MD on 06/15/2017 Nuclear grade: G3 Histologic grading system: 3 grade system - Pathologic stage from 06/26/2017: Stage IB (pT1c, pN0, cM0, G3, ER-, PR-, HER2-) - Signed by FTruitt Merle MD on 07/10/2017 Neoadjuvant therapy: No Nuclear grade: G3 Multigene prognostic tests performed: None Histologic grading system: 3 grade system Laterality: Left     Malignant neoplasm of lower-outer quadrant of left breast of female, estrogen receptor negative ([place]  05/30/2017 Mammogram   Diagnostic mammo and [place]PRESS[place]: 1. Highly suspicious 1.4 cm mass in the slightly lower slightly outer left breast -tissue sampling recommended. 2. Indeterminate 0.5 mm mass in the slightly lower slightly outer left breast -tissue sampling recommended. 3. At least 2 left axillary lymph nodes with borderline cortical thickness.   06/06/2017 Initial Biopsy   Diagnosis 1. Breast, left, needle core biopsy, 5:30 o'clock - INVASIVE DUCTAL CARCINOMA, G3 2. Lymph node, needle/core biopsy, left axillary - NO CARCINOMA IDENTIFIED IN ONE LYMPH NODE (0/1)   06/06/2017 Initial Diagnosis   Malignant neoplasm of lower-outer quadrant of left breast of female, estrogen receptor negative ([place]   06/06/2017 Receptors her2   Estrogen Receptor: 0%, NEGATIVE Progesterone Receptor: 0%, NEGATIVE Proliferation Marker Ki67: 70%   06/26/2017 Surgery   LEFT BREAST LUMPECTOMY WITH RADIOACTIVE SEED AND SENTINEL LYMPH NODE BIOPSY ERAS  PATHWAY AND  INSERTION PORT-A-CATH By Dr. BBarry Dieneson 06/26/17    06/26/2017 Pathology Results   Diagnosis 06/26/17  1. Breast, lumpectomy, Left - INVASIVE DUCTAL CARCINOMA, GRADE III/III, SPANNING 1.2 CM. - THE SURGICAL RESECTION MARGINS ARE NEGATIVE FOR CARCINOMA. - SEE ONCOLOGY TABLE BELOW. 2. Lymph node, sentinel, biopsy, Left axillary #1 - THERE IS NO EVIDENCE OF CARCINOMA IN 1 OF 1 LYMPH NODE (0/1). 3. Lymph node, sentinel, biopsy, Left axillary #2 - THERE IS NO EVIDENCE OF CARCINOMA IN 1 OF 1 LYMPH NODE (0/1). 4. Lymph node, sentinel, biopsy, Left axillary #3 - THERE IS NO EVIDENCE OF CARCINOMA IN 1 OF 1 LYMPH NODE (0/1).    07/25/2017 - 09/29/2017 Chemotherapy   Adjuvant cytoxan and docetaxel (TC) every 3 weekd for 4 cycles     11/19/2017 - 12/17/2017 Radiation Therapy   Adjuvant breast radiation Left breast treated to 42.5 Gy with 17 fx of 2.5 Gy followed by a boost of 7.5 Gy with 3 fx of 2.5 Gy      02/2019 Procedure   She had PAC removal in 02/2019.    07/23/2020 Imaging   MRI Lumbar Spine 07/23/20 IMPRESSION: 1. No significant disc herniation, spinal canal or neural foraminal stenosis at any level. 2. Moderate facet degenerative changes at L3-4, L4-5 and L5-S1. 3.  Multiple enlarged retroperitoneal and right iliac lymph nodes. Recommend correlation with CT of the abdomen and pelvis with contrast.   08/15/2020 Imaging   CT AP 08/15/20  IMPRESSION: 1. Right iliac and periaortic adenopathy is noted concerning for metastatic disease or lymphoma. Also noted is abnormal soft tissue mass anterior and posterior to the right iliac wing concerning for malignancy or metastatic disease. MRI is recommended for further evaluation. 2. Irregular lucency with sclerotic margins is seen involving the right iliac crest concerning for possible lytic lesion. MRI may be performed for further evaluation. 3. Enlarged fibroid uterus. 4. Small gallstone. 5. Aortic atherosclerosis.   08/22/2020 Pathology  Results   FINAL MICROSCOPIC DIAGNOSIS: 08/22/20  A. NEEDLE CORE, RIGHT GLUTEUS MINIMUMUS, BIOPSY:  -  Metastatic carcinoma  -  See comment   COMMENT:  By immunohistochemistry, the neoplastic cells are positive for  cytokeratin-7 and have weak patchy positivity for GATA3 but are negative for cytokeratin-20 and GCDFP.  The combined morphology and immunophenotype, support metastasis from the patient's known breast primary carcinoma.   ADDENDUM:  PROGNOSTIC INDICATOR RESULTS:  The tumor cells are NEGATIVE for Her2 (0).  Estrogen Receptor:       POSITIVE, 20%, WEAK STAINING  Progesterone Receptor:   NEGATIVE   PD-L1 (0) negative    09/04/2020 - 09/22/2020 Radiation Therapy   Palliative radiation to right iliac 09/04/20 - 09/22/20, Dr. Lisbeth Renshaw   09/26/2020 -  Chemotherapy   first line weekly Taxol 3 weeks on/1 week off starting 09/26/20 --Reduced to 2 weeks on/1 week off starting with C3 due to neuropathy. Stopped after C4 on 12/18/20 due to neuratrophy.  --Switched to Xeloda on 01/01/21 at 1515m in the AM and 20071min the PM for 2 weeks on/1 week off     10/13/2020 Procedure    PAC placement on 10/13/20.    04/10/2021 Imaging   CT CAP  IMPRESSION: 1. Enlarging abdominopelvic retroperitoneal and right inguinal adenopathy, compatible with disease progression. 2. Lytic metastasis in the right iliac wing, similar. 3. Hepatomegaly. 4. Cholelithiasis. 5. Enlarged fibroid uterus. 6.  Aortic atherosclerosis (ICD10-I70.0).   06/18/2021 Imaging   CT CAP  IMPRESSION: 1. Today's study demonstrates progression of metastatic disease as evidence by increased number and size of numerous enlarged lymph nodes in the retroperitoneum, along the right pelvic sidewall, and in the upper right thigh. 2. Osseous metastases in the bony pelvis appear similar to the prior examination. No definite new osseous lesions are otherwise noted. 3. Fibroid uterus again noted. 4. Cholelithiasis without evidence of acute  cholecystitis. 5. Aortic atherosclerosis, in addition to left anterior descending coronary artery disease. Please note that although the presence of coronary artery calcium documents the presence of coronary artery disease, the severity of this disease and any potential stenosis cannot be assessed on this non-gated CT examination. Assessment for potential risk factor modification, dietary therapy or pharmacologic therapy may be warranted, if clinically indicated. 6. Additional incidental findings, as above.   06/20/2021 Imaging   Bone Scan  IMPRESSION: No osseous uptake suspicious for recurrent/progressive metastatic disease.   07/03/2021 -  Chemotherapy   Patient is on Treatment Plan : BREAST METASTATIC Sacituzumab [id number] (TIvette Loyalq21d        INTERVAL HISTORY:  Kristin Ward is here for a follow up of metastatic breast cancer. She was last seen by me on 07/12/21. She presents to the clinic alone. She has a fever of 100F today. She reports she was coughing a lot the last few days, but  this is improved today. She also reports fatigue; she describes her energy as 80%, but she seems to be more sluggish than that today. She reports a sinus headache today, and she is bending over, holding her head, and covering her eyes. She denies sore throat, testing for Covid. She reports her appetite is good, but she adds she was vomiting last night. She reports a lot of diarrhea following her last treatment; she was seen in the ED on 07/19/21 for this.   All other systems were reviewed with the patient and are negative.  MEDICAL HISTORY:  Past Medical History:  Diagnosis Date   Abnormal x-ray of lungs with single pulmonary nodule 03/15/2016   per records from [place]    Anemia    Asthma    Cholelithiasis 05/19/2017   On CT   Coronary artery calcification seen on CAT scan 05/19/2017   Diabetes mellitus without complication ([place])    type 2   Dilated idiopathic cardiomyopathy ([place])  12/2015   EF 15-20%. Diagnosed in [facility]   Headache    NONE RECENT   Hypertension    Lymph nodes enlarged 07/25/2020   Malignant neoplasm of lower-outer quadrant of left breast of female, estrogen receptor negative ([place]) 06/11/2017   left breast   Mixed hyperlipidemia 06/17/2018   partially blind    Personal history of chemotherapy 2018-2019   Personal history of radiation therapy 2019   Retinitis pigmentosa    Retroperitoneal lymphadenopathy 07/25/2020   Uveitis     SURGICAL HISTORY: Past Surgical History:  Procedure Laterality Date   brain cyst removed  2007   to help with headaches per patient , aspirated    BREAST BIOPSY Left 06/06/2017   x2   BREAST CYST ASPIRATION Left 06/06/2017   BREAST LUMPECTOMY Left 06/26/2017   BREAST LUMPECTOMY WITH RADIOACTIVE SEED AND SENTINEL LYMPH NODE BIOPSY Left 06/26/2017   Procedure: BREAST LUMPECTOMY WITH RADIOACTIVE SEED AND SENTINEL LYMPH NODE BIOPSY ERAS  PATHWAY;  Surgeon: [name], MD;  Location: [place];  Service: General;  Laterality: Left;  pec block   FINE NEEDLE ASPIRATION Left 02/23/2019   Procedure: FINE NEEDLE ASPIRATION;  Surgeon: [name], MD;  Location: [place];  Service: General;  Laterality: Left;  Asiration of left breast seroma    IR IMAGING GUIDED PORT INSERTION  10/13/2020   NASAL TURBINATE REDUCTION     PORT-A-CATH REMOVAL Right 02/23/2019   Procedure: REMOVAL PORT-A-CATH;  Surgeon: [name], MD;  Location: [place];  Service: General;  Laterality: Right;   PORTACATH PLACEMENT N/A 06/26/2017   Procedure: INSERTION PORT-A-CATH;  Surgeon: [name], MD;  Location: [employer];  Service: General;  Laterality: N/A;   TRANSTHORACIC ECHOCARDIOGRAM  12/2015   A) [facility] May 2017: Mild concentric LVH. Global hypokinesis. GR daily. EF 18% severe LA dilation. Mitral annular dilatation with papillary muscle dysfunction and moderate MR. Dilated IVC  consistent with elevated RAP.   TRANSTHORACIC ECHOCARDIOGRAM  05/2017; 08/2017   a) Mild concentric LVH.  EF 45 to 50% with diffuse hypokinesis.  GR 1 DD.  Mild aortic root dilation.;; b)  Mild LVH EF 45 to 50%.  Diffuse HK.  No significant valve disease.  No significant change.    I have reviewed the social history and family history with the patient and they are unchanged from previous note.  ALLERGIES:  is allergic to morphine and related, latex, tylenol with codeine #3 [acetaminophen-codeine], and penicillins.  MEDICATIONS:  Current Outpatient Medications  Medication Sig Dispense Refill   Accu-Chek FastClix Lancets MISC TEST TWICE A DAY. PT USES AN ACCU-CHEK GUIDE ME METER 102 each 2   aspirin 81 MG chewable tablet Chew 81 mg by mouth daily.     atorvastatin (LIPITOR) 20 MG tablet Take 1 tablet (20 mg total) by mouth daily. 90 tablet 3   carvedilol (COREG) 12.5 MG tablet TAKE 1 TABLET (12.5 MG TOTAL) BY MOUTH 2 (TWO) TIMES DAILY WITH A MEAL. 180 tablet 1   Cholecalciferol (D3-1000 PO) Take by mouth.     Continuous Blood Gluc Sensor (FREESTYLE LIBRE 14 DAY SENSOR) MISC 1 each by Does not apply route every 14 (fourteen) days. (Patient not taking: Reported on 06/25/2021) 3 each 4   diclofenac (VOLTAREN) 75 MG EC tablet Take 1 tablet (75 mg total) by mouth 2 (two) times daily. (Patient not taking: Reported on 06/25/2021) 60 tablet 1   furosemide (LASIX) 20 MG tablet Take 20 mg by mouth daily. Patient is unsure of dose     glucose blood (ACCU-CHEK GUIDE) test strip Use as instructed to test blood sugar 2 times a day 100 strip 2   ibuprofen (ADVIL) 800 MG tablet Take 1 tablet (800 mg total) by mouth every 8 (eight) hours as needed. 30 tablet 2   lidocaine-prilocaine (EMLA) cream Apply 1 application topically as needed. 30 g 0   linaclotide (LINZESS) 72 MCG capsule Take 1 capsule (72 mcg total) by mouth daily before breakfast. MUST HAVE APPOINTMENT FOR REFILLS 30 capsule 1   metFORMIN  (GLUCOPHAGE) 500 MG tablet Take 1 tablet (500 mg total) by mouth 2 (two) times daily with a meal. 180 tablet 1   Multiple Vitamins-Minerals (WOMENS MULTI PO) Take by mouth.     RYBELSUS 3 MG TABS TAKE 1 TABLET BY MOUTH DAILY WITH BREAKFAST. 30 tablet 3   Saccharomyces boulardii (PROBIOTIC) 250 MG CAPS Take by mouth. (Patient not taking: No sig reported)     sacubitril-valsartan (ENTRESTO) 97-103 MG Take 1 tablet by mouth 2 (two) times daily. 180 tablet 3   spironolactone (ALDACTONE) 25 MG tablet Take 1 tablet (25 mg total) by mouth daily. 90 tablet 3   vitamin B-12 (CYANOCOBALAMIN) 500 MCG tablet Take 500 mcg by mouth daily.     vitamin C (ASCORBIC ACID) 500 MG tablet Take 1,000 mg 2 (two) times daily by mouth.     No current facility-administered medications for this visit.   Facility-Administered Medications Ordered in Other Visits  Medication Dose Route Frequency Provider Last Rate Last Admin   0.9 %  sodium chloride infusion   Intravenous Continuous [name], MD 500 mL/hr at 07/27/21 1150 New Bag at 07/27/21 1150    PHYSICAL EXAMINATION: ECOG PERFORMANCE STATUS: 2 - Symptomatic, <50% confined to bed  Vitals:   07/27/21 0945  BP: (!) 128/93  Pulse: (!) 118  Resp: 18  Temp: 100 F (37.8 C)  SpO2: 96%   Wt Readings from Last 3 Encounters:  07/27/21 225 lb 4.8 oz (102.2 kg)  07/12/21 234 lb 7 oz (106.3 kg)  07/03/21 230 lb 12 oz (104.7 kg)     GENERAL:alert, no distress and comfortable SKIN: skin color, texture, turgor are normal, no rashes or significant lesions EYES: normal, Conjunctiva are pink and non-injected, sclera clear  NECK: supple, thyroid normal size, non-tender, without nodularity LYMPH:  no palpable lymphadenopathy in the cervical, axillary  LUNGS: clear to auscultation and percussion with normal breathing effort HEART: regular rate & rhythm and no murmurs and  no lower extremity edema ABDOMEN:abdomen soft, non-tender and normal bowel sounds Musculoskeletal:no  cyanosis of digits and no clubbing  NEURO: alert & oriented x 3 with fluent speech, no focal motor/sensory deficits  LABORATORY DATA:  I have reviewed the data as listed CBC Latest Ref Rng & Units 07/27/2021 07/12/2021 07/03/2021  WBC 4.0 - 10.5 K/uL 5.0 1.7(L) 4.4  Hemoglobin 12.0 - 15.0 g/dL 10.7(L) 10.1(L) 11.3(L)  Hematocrit 36.0 - 46.0 % 31.9(L) 29.5(L) 33.5(L)  Platelets 150 - 400 K/uL 355 206 262     CMP Latest Ref Rng & Units 07/27/2021 07/12/2021 07/03/2021  Glucose 70 - 99 mg/dL 126(H) 174(H) 126(H)  BUN 8 - 23 mg/dL '9 10 14  ' Creatinine 0.44 - 1.00 mg/dL 0.82 0.88 0.82  Sodium 135 - 145 mmol/L 135 144 142  Potassium 3.5 - 5.1 mmol/L 3.9 3.8 3.9  Chloride 98 - 111 mmol/L 102 111 110  CO2 22 - 32 mmol/L '24 24 25  ' Calcium 8.9 - 10.3 mg/dL 9.2 8.7(L) 9.2  Total Protein 6.5 - 8.1 g/dL 7.7 7.4 7.8  Total Bilirubin 0.3 - 1.2 mg/dL 0.5 0.5 0.5  Alkaline Phos 38 - 126 U/L 113 134(H) 108  AST 15 - 41 U/L 24 84(H) 17  ALT 0 - 44 U/L 27 93(H) 12      RADIOGRAPHIC STUDIES: I have personally reviewed the radiological images as listed and agreed with the findings in the report. No results found.    Orders Placed This Encounter  Procedures   Culture, Blood    Port draw    Standing Status:   Future    Number of Occurrences:   1    Standing Expiration Date:   07/27/2022   Culture, blood (single) w Reflex to ID Panel    Peripheral draw    Standing Status:   Future    Number of Occurrences:   1    Standing Expiration Date:   07/27/2022   Urine Culture    Standing Status:   Future    Number of Occurrences:   1    Standing Expiration Date:   07/27/2022   Coronavirus (COVID-19) with Influenza A and Influenza B    Order Specific Question:   Previously tested for COVID-19    Answer:   Yes    Order Specific Question:   Resident in a congregate (group) care setting    Answer:   No    Order Specific Question:   Is the patient student?    Answer:   No    Order Specific Question:    Employed in healthcare setting    Answer:   No    Order Specific Question:   Pregnant    Answer:   No    Order Specific Question:   Has patient completed COVID vaccination(s) (2 doses of Pfizer/Moderna 1 dose of [employer])    Answer:   Yes    Order Specific Question:   Has patient completed COVID Booster / 3rd dose    Answer:   Yes    Order Specific Question:   Release to patient    Answer:   Immediate   DG Chest 2 View    Standing Status:   Future    Number of Occurrences:   1    Standing Expiration Date:   07/27/2022    Order Specific Question:   Reason for Exam (SYMPTOM  OR DIAGNOSIS REQUIRED)    Answer:   cough, rule out  Order Specific Question:   Preferred imaging location?    Answer:   [facility]   Urinalysis, Complete w Microscopic    Standing Status:   Future    Number of Occurrences:   1    Standing Expiration Date:   07/27/2022   All questions were answered. The patient knows to call the clinic with any problems, questions or concerns. No barriers to learning was detected. The total time spent in the appointment was 30 minutes.     [name], MD 07/27/2021   I, Wilburn Mylar, am acting as scribe for [name], MD.   I have reviewed the above documentation for accuracy and completeness, and I agree with the above.

## 2021-07-27 NOTE — Patient Instructions (Signed)
Cefepime Injection What is this medication? CEFEPIME (SEF e pim) is a cephalosporin antibiotic. It treats some infections caused by bacteria. It will not work for colds, the flu, or other viruses. This medicine may be used for other purposes; ask your health care provider or pharmacist if you have questions. COMMON BRAND NAME(S): Maxipime What should I tell my care team before I take this medication? They need to know if you have any of these conditions: bleeding problems kidney disease stomach, intestinal problems like colitis an unusual or allergic reaction to cefepime, other cephalosporin or penicillin antibiotics, imipenem, other foods, dyes or preservatives pregnant or trying to get pregnant breast-feeding How should I use this medication? This drug is injected into a muscle or a vein. It is usually given by a health care provider in a hospital or clinic setting. If you get this drug at home, you will be taught how to prepare and give it. Use exactly as directed. Take it as directed on the prescription label at the same time every day. Keep taking it unless your health care provider tells you to stop. It is important that you put your used needles and syringes in a special sharps container. Do not put them in a trash can. If you do not have a sharps container, call your pharmacist or health care provider to get one. Talk to your health care provider about the use of this drug in children. While it may be prescribed for children as young as 2 months for selected conditions, precautions do apply. Overdosage: If you think you have taken too much of this medicine contact a poison control center or emergency room at once. NOTE: This medicine is only for you. Do not share this medicine with others. What if I miss a dose? It is important not to miss your dose. Call your health care provider if you are unable to keep an appointment. If you give yourself this drug at home and you miss a dose, take  it as soon as you can. If it is almost time for your next dose, take only that dose. Do not take double or extra doses. What may interact with this medication? This medicine may interact with the following medications: birth control pills certain medicines for infection like amikacin, gentamicin, tobramycin diuretics This list may not describe all possible interactions. Give your health care provider a list of all the medicines, herbs, non-prescription drugs, or dietary supplements you use. Also tell them if you smoke, drink alcohol, or use illegal drugs. Some items may interact with your medicine. What should I watch for while using this medication? Tell your doctor or health care provider if your symptoms do not begin to improve or if you get new symptoms. Your doctor will monitor your condition and blood work as needed. This medicine may cause serious skin reactions. They can happen weeks to months after starting the medicine. Contact your health care provider right away if you notice fevers or flu-like symptoms with a rash. The rash may be red or purple and then turn into blisters or peeling of the skin. Or, you might notice a red rash with swelling of the face, lips or lymph nodes in your neck or under your arms. Do not treat diarrhea with over-the-counter products. Contact your doctor if you have diarrhea that lasts more than 2 days or if the diarrhea is severe and watery. This medicine can interfere with some urine glucose tests. If you use such tests, talk with your  health care provider. What side effects may I notice from receiving this medication? Side effects that you should report to your doctor or health care professional as soon as possible: allergic reactions like skin rash, itching or hives, swelling of the face, lips, or tongue confusion, hallucinations difficulty breathing, wheezing fever pain or difficulty passing urine redness, blistering, peeling or loosening of the skin,  including inside the mouth seizures stiff, rigid muscles unusual bleeding, bruising unusually weak or tired Side effects that usually do not require medical attention (report to your doctor or health care professional if they continue or are bothersome): diarrhea headache mouth sores, irritation nausea, vomiting pain, swelling and irritation at the injection site This list may not describe all possible side effects. Call your doctor for medical advice about side effects. You may report side effects to FDA at 1-800-FDA-1088. Where should I keep my medication? Keep out of the reach of children and pets. You will be instructed on how to store this drug. Protect from light. Throw away any unused drug after the expiration date. NOTE: This sheet is a summary. It may not cover all possible information. If you have questions about this medicine, talk to your doctor, pharmacist, or health care provider.  2022 Elsevier/Gold Standard (2019-04-21 00:00:00)  Rehydration, Adult Rehydration is the replacement of body fluids, salts, and minerals (electrolytes) that are lost during dehydration. Dehydration is when there is not enough water or other fluids in the body. This happens when you lose more fluids than you take in. Common causes of dehydration include: Not drinking enough fluids. This can occur when you are ill or doing activities that require a lot of energy, especially in hot weather. Conditions that cause loss of water or other fluids, such as diarrhea, vomiting, sweating, or urinating a lot. Other illnesses, such as fever or infection. Certain medicines, such as those that remove excess fluid from the body (diuretics). Symptoms of mild or moderate dehydration may include thirst, dry lips and mouth, and dizziness. Symptoms of severe dehydration may include increased heart rate, confusion, fainting, and not urinating. For severe dehydration, you may need to get fluids through an IV at the  hospital. For mild or moderate dehydration, you can usually rehydrate at home by drinking certain fluids as told by your health care provider. What are the risks? Generally, rehydration is safe. However, taking in too much fluid (overhydration) can be a problem. This is rare. Overhydration can cause an electrolyte imbalance, kidney failure, or a decrease in salt (sodium) levels in the body. Supplies needed You will need an oral rehydration solution (ORS) if your health care provider tells you to use one. This is a drink to treat dehydration. It can be found in pharmacies and retail stores. How to rehydrate Fluids Follow instructions from your health care provider for rehydration. The kind of fluid and the amount you should drink depend on your condition. In general, you should choose drinks that you prefer. If told by your health care provider, drink an ORS. Make an ORS by following instructions on the package. Start by drinking small amounts, about  cup (120 mL) every 5-10 minutes. Slowly increase how much you drink until you have taken the amount recommended by your health care provider. Drink enough clear fluids to keep your urine pale yellow. If you were told to drink an ORS, finish it first, then start slowly drinking other clear fluids. Drink fluids such as: Water. This includes sparkling water and flavored water. Drinking only water  can lead to having too little sodium in your body (hyponatremia). Follow the advice of your health care provider. Water from ice chips you suck on. Fruit juice with water you add to it (diluted). Sports drinks. Hot or cold herbal teas. Broth-based soups. Milk or milk products. Food Follow instructions from your health care provider about what to eat while you rehydrate. Your health care provider may recommend that you slowly begin eating regular foods in small amounts. Eat foods that contain a healthy balance of electrolytes, such as bananas, oranges,  potatoes, tomatoes, and spinach. Avoid foods that are greasy or contain a lot of sugar. In some cases, you may get nutrition through a feeding tube that is passed through your nose and into your stomach (nasogastric tube, or NG tube). This may be done if you have uncontrolled vomiting or diarrhea. Beverages to avoid Certain beverages may make dehydration worse. While you rehydrate, avoid drinking alcohol. How to tell if you are recovering from dehydration You may be recovering from dehydration if: You are urinating more often than before you started rehydrating. Your urine is pale yellow. Your energy level improves. You vomit less frequently. You have diarrhea less frequently. Your appetite improves or returns to normal. You feel less dizzy or less light-headed. Your skin tone and color start to look more normal. Follow these instructions at home: Take over-the-counter and prescription medicines only as told by your health care provider. Do not take sodium tablets. Doing this can lead to having too much sodium in your body (hypernatremia). Contact a health care provider if: You continue to have symptoms of mild or moderate dehydration, such as: Thirst. Dry lips. Slightly dry mouth. Dizziness. Dark urine or less urine than normal. Muscle cramps. You continue to vomit or have diarrhea. Get help right away if you: Have symptoms of dehydration that get worse. Have a fever. Have a severe headache. Have been vomiting and the following happens: Your vomiting gets worse or does not go away. Your vomit includes blood or green matter (bile). You cannot eat or drink without vomiting. Have problems with urination or bowel movements, such as: Diarrhea that gets worse or does not go away. Blood in your stool (feces). This may cause stool to look black and tarry. Not urinating, or urinating only a small amount of very dark urine, within 6-8 hours. Have trouble breathing. Have symptoms that  get worse with treatment. These symptoms may represent a serious problem that is an emergency. Do not wait to see if the symptoms will go away. Get medical help right away. Call your local emergency services (911 in the [place] Do not drive yourself to the hospital. Summary Rehydration is the replacement of body fluids and minerals (electrolytes) that are lost during dehydration. Follow instructions from your health care provider for rehydration. The kind of fluid and amount you should drink depend on your condition. Slowly increase how much you drink until you have taken the amount recommended by your health care provider. Contact your health care provider if you continue to show signs of mild or moderate dehydration. This information is not intended to replace advice given to you by your health care provider. Make sure you discuss any questions you have with your health care provider. Document Revised: 09/29/2019 Document Reviewed: 08/09/2019 Elsevier Patient Education  2022 [employer].

## 2021-07-27 NOTE — Progress Notes (Signed)
Dr. Burr Medico came to chairside to assess pt post IV Abx and hydration.  Pt stated she's feeling much better.  Pt's chest x-ray was clear as well as other resulted blood/urine test at time of assessment.  Pt's Covid/Flu and BC x2 results are still processing.  Dr. Burr Medico prescribed home prescription for Levaquin for the pt to take until [facility] x2 result.  Dr. Burr Medico informed pt that she will contact the pt once the remaining labs result d/t she may do Abx modifications.  Dr. Burr Medico asked pt to give office call on 08/02/2021 to give update on her status.  Dr. Burr Medico said she will hold chemotherapy on 08/03/2021 and based on how pt is feeling on 08/02/2021 will determine if Dr. Burr Medico will have the pt come in for hydration.  Verbal order for Acetaminophen 650mg  PO given by Dr. Burr Medico and administered as ordered by this RN.  Pt d/c home stable.

## 2021-07-27 NOTE — Progress Notes (Signed)
Open in error

## 2021-07-27 NOTE — Addendum Note (Signed)
Addended by: [name] on: 07/27/2021 08:19 PM   Modules accepted: Orders

## 2021-07-28 LAB — URINE CULTURE

## 2021-07-30 ENCOUNTER — Other Ambulatory Visit: Payer: Self-pay

## 2021-07-30 NOTE — Progress Notes (Signed)
LVM for pt to return my message regarding recommended OTC Cough Syrup.  Pt had left voicemail message asking what Dr. Burr Medico recommends for her to take for her cough.  Pt happened to had coughed while leaving the voicemail and it sounded as if the phlegm in her chest is starting to breakup.  Dr. Burr Medico wants to know what color is the phlegm so Dr. Burr Medico can determine if the pt needs Abx and a stronger cough medication.  Dr. Burr Medico currently recommends Robitussin DM OTC and pepcid OTC (for reflux the pt c/o).  Pt was dx last Friday w/Flu.  Dr. Burr Medico prescribed Tamiflu which the pt took as prescribed.  Awaiting return telephone call from pt regarding phlegm description so Dr. Burr Medico can better assess the pt's care.

## 2021-08-01 LAB — CULTURE, BLOOD (SINGLE)
Culture: NO GROWTH
Culture: NO GROWTH
Special Requests: ADEQUATE

## 2021-08-03 ENCOUNTER — Inpatient Hospital Stay: Payer: Medicare Other

## 2021-08-03 ENCOUNTER — Other Ambulatory Visit: Payer: Self-pay

## 2021-08-03 VITALS — BP 142/77 | HR 60 | Temp 97.8°F | Resp 18 | Wt 229.2 lb

## 2021-08-03 DIAGNOSIS — H3552 Pigmentary retinal dystrophy: Secondary | ICD-10-CM | POA: Diagnosis not present

## 2021-08-03 DIAGNOSIS — C50512 Malignant neoplasm of lower-outer quadrant of left female breast: Secondary | ICD-10-CM

## 2021-08-03 DIAGNOSIS — R509 Fever, unspecified: Secondary | ICD-10-CM | POA: Diagnosis not present

## 2021-08-03 DIAGNOSIS — M25551 Pain in right hip: Secondary | ICD-10-CM | POA: Diagnosis not present

## 2021-08-03 DIAGNOSIS — Z7984 Long term (current) use of oral hypoglycemic drugs: Secondary | ICD-10-CM | POA: Diagnosis not present

## 2021-08-03 DIAGNOSIS — Z170 Estrogen receptor positive status [ER+]: Secondary | ICD-10-CM | POA: Diagnosis not present

## 2021-08-03 DIAGNOSIS — E559 Vitamin D deficiency, unspecified: Secondary | ICD-10-CM | POA: Diagnosis not present

## 2021-08-03 DIAGNOSIS — Z5112 Encounter for antineoplastic immunotherapy: Secondary | ICD-10-CM | POA: Diagnosis not present

## 2021-08-03 DIAGNOSIS — G620 Drug-induced polyneuropathy: Secondary | ICD-10-CM | POA: Diagnosis not present

## 2021-08-03 DIAGNOSIS — Z95828 Presence of other vascular implants and grafts: Secondary | ICD-10-CM

## 2021-08-03 DIAGNOSIS — H548 Legal blindness, as defined in USA: Secondary | ICD-10-CM | POA: Diagnosis not present

## 2021-08-03 DIAGNOSIS — J449 Chronic obstructive pulmonary disease, unspecified: Secondary | ICD-10-CM | POA: Diagnosis not present

## 2021-08-03 DIAGNOSIS — C7989 Secondary malignant neoplasm of other specified sites: Secondary | ICD-10-CM | POA: Diagnosis not present

## 2021-08-03 DIAGNOSIS — E119 Type 2 diabetes mellitus without complications: Secondary | ICD-10-CM | POA: Diagnosis not present

## 2021-08-03 DIAGNOSIS — Z1152 Encounter for screening for COVID-19: Secondary | ICD-10-CM | POA: Diagnosis not present

## 2021-08-03 DIAGNOSIS — C778 Secondary and unspecified malignant neoplasm of lymph nodes of multiple regions: Secondary | ICD-10-CM | POA: Diagnosis not present

## 2021-08-03 DIAGNOSIS — I10 Essential (primary) hypertension: Secondary | ICD-10-CM | POA: Diagnosis not present

## 2021-08-03 DIAGNOSIS — I5042 Chronic combined systolic (congestive) and diastolic (congestive) heart failure: Secondary | ICD-10-CM | POA: Diagnosis not present

## 2021-08-03 DIAGNOSIS — Z9221 Personal history of antineoplastic chemotherapy: Secondary | ICD-10-CM | POA: Diagnosis not present

## 2021-08-03 DIAGNOSIS — Z7189 Other specified counseling: Secondary | ICD-10-CM

## 2021-08-03 DIAGNOSIS — T451X5A Adverse effect of antineoplastic and immunosuppressive drugs, initial encounter: Secondary | ICD-10-CM | POA: Diagnosis not present

## 2021-08-03 DIAGNOSIS — Z923 Personal history of irradiation: Secondary | ICD-10-CM | POA: Diagnosis not present

## 2021-08-03 LAB — COMPREHENSIVE METABOLIC PANEL
ALT: 12 U/L (ref 0–44)
AST: 15 U/L (ref 15–41)
Albumin: 3.9 g/dL (ref 3.5–5.0)
Alkaline Phosphatase: 79 U/L (ref 38–126)
Anion gap: 7 (ref 5–15)
BUN: 12 mg/dL (ref 8–23)
CO2: 26 mmol/L (ref 22–32)
Calcium: 9.3 mg/dL (ref 8.9–10.3)
Chloride: 111 mmol/L (ref 98–111)
Creatinine, Ser: 0.72 mg/dL (ref 0.44–1.00)
GFR, Estimated: >60 mL/min (ref >60)
Glucose, Bld: 131 mg/dL — ABNORMAL HIGH (ref 70–99)
Potassium: 3.8 mmol/L (ref 3.5–5.1)
Sodium: 144 mmol/L (ref 135–145)
Total Bilirubin: 0.4 mg/dL (ref 0.3–1.2)
Total Protein: 7.1 g/dL (ref 6.5–8.1)

## 2021-08-03 LAB — CBC WITH DIFFERENTIAL/PLATELET
Abs Immature Granulocytes: 0.28 10*3/uL — ABNORMAL HIGH (ref 0.00–0.07)
Basophils Absolute: 0 10*3/uL (ref 0.0–0.1)
Basophils Relative: 0 %
Eosinophils Absolute: 0.2 10*3/uL (ref 0.0–0.5)
Eosinophils Relative: 3 %
HCT: 30.7 % — ABNORMAL LOW (ref 36.0–46.0)
Hemoglobin: 10.4 g/dL — ABNORMAL LOW (ref 12.0–15.0)
Immature Granulocytes: 5 %
Lymphocytes Relative: 22 %
Lymphs Abs: 1.2 10*3/uL (ref 0.7–4.0)
MCH: 32.1 pg (ref 26.0–34.0)
MCHC: 33.9 g/dL (ref 30.0–36.0)
MCV: 94.8 fL (ref 80.0–100.0)
Monocytes Absolute: 0.6 10*3/uL (ref 0.1–1.0)
Monocytes Relative: 11 %
Neutro Abs: 3.2 10*3/uL (ref 1.7–7.7)
Neutrophils Relative %: 59 %
Platelets: 323 10*3/uL (ref 150–400)
RBC: 3.24 MIL/uL — ABNORMAL LOW (ref 3.87–5.11)
RDW: 13.1 % (ref 11.5–15.5)
WBC: 5.5 10*3/uL (ref 4.0–10.5)
nRBC: 0 % (ref 0.0–0.2)

## 2021-08-03 MED ORDER — SODIUM CHLORIDE 0.9 % IV SOLN
10.0000 mg/kg | Freq: Once | INTRAVENOUS | Status: AC
  Administered 2021-08-03: 12:00:00 1080 mg via INTRAVENOUS
  Filled 2021-08-03: qty 108

## 2021-08-03 MED ORDER — SODIUM CHLORIDE 0.9% FLUSH
10.0000 mL | INTRAVENOUS | Status: DC | PRN
  Administered 2021-08-03: 09:00:00 10 mL via INTRAVENOUS

## 2021-08-03 MED ORDER — ZOLEDRONIC ACID 4 MG/100ML IV SOLN
4.0000 mg | Freq: Once | INTRAVENOUS | Status: AC
  Administered 2021-08-03: 11:00:00 4 mg via INTRAVENOUS
  Filled 2021-08-03: qty 100

## 2021-08-03 MED ORDER — HEPARIN SOD (PORK) LOCK FLUSH 100 UNIT/ML IV SOLN
500.0000 [IU] | Freq: Once | INTRAVENOUS | Status: AC | PRN
  Administered 2021-08-03: 14:00:00 500 [IU]

## 2021-08-03 MED ORDER — SODIUM CHLORIDE 0.9 % IV SOLN
10.0000 mg | Freq: Once | INTRAVENOUS | Status: AC
  Administered 2021-08-03: 11:00:00 10 mg via INTRAVENOUS
  Filled 2021-08-03: qty 1
  Filled 2021-08-03: qty 10

## 2021-08-03 MED ORDER — PALONOSETRON HCL INJECTION 0.25 MG/5ML
0.2500 mg | Freq: Once | INTRAVENOUS | Status: AC
  Administered 2021-08-03: 10:00:00 0.25 mg via INTRAVENOUS
  Filled 2021-08-03: qty 5

## 2021-08-03 MED ORDER — FOSAPREPITANT DIMEGLUMINE INJECTION 150 MG
150.0000 mg | Freq: Once | INTRAVENOUS | Status: AC
  Administered 2021-08-03: 11:00:00 150 mg via INTRAVENOUS
  Filled 2021-08-03: qty 150
  Filled 2021-08-03: qty 5

## 2021-08-03 MED ORDER — FAMOTIDINE 20 MG IN NS 100 ML IVPB
20.0000 mg | Freq: Once | INTRAVENOUS | Status: AC
  Administered 2021-08-03: 10:00:00 20 mg via INTRAVENOUS
  Filled 2021-08-03: qty 100

## 2021-08-03 MED ORDER — DIPHENHYDRAMINE HCL 50 MG/ML IJ SOLN
50.0000 mg | Freq: Once | INTRAMUSCULAR | Status: AC
  Administered 2021-08-03: 10:00:00 50 mg via INTRAVENOUS
  Filled 2021-08-03: qty 1

## 2021-08-03 MED ORDER — SODIUM CHLORIDE 0.9% FLUSH
10.0000 mL | INTRAVENOUS | Status: DC | PRN
  Administered 2021-08-03: 14:00:00 10 mL

## 2021-08-03 MED ORDER — ACETAMINOPHEN 325 MG PO TABS
650.0000 mg | ORAL_TABLET | Freq: Once | ORAL | Status: AC
  Administered 2021-08-03: 10:00:00 650 mg via ORAL
  Filled 2021-08-03: qty 2

## 2021-08-03 MED ORDER — SODIUM CHLORIDE 0.9 % IV SOLN
Freq: Once | INTRAVENOUS | Status: AC
Start: 2021-08-03 — End: 2021-08-03

## 2021-08-03 NOTE — Progress Notes (Signed)
Assessed pt while in infusion to see how pt is doing post Flu.  Pt confirmed completion of Tamaflu and Levaquin.  Pt denied any fevers, aches, coughs, or pains.  Pt stated her cough has cleared and she feels well.  Per Dr. Burr Medico, "OK To Proceed With Tx Today."

## 2021-08-03 NOTE — Patient Instructions (Addendum)
[facility]  Discharge Instructions: Thank you for choosing [facility] to provide your oncology and hematology care.   If you have a lab appointment with the [facility], please go directly to the Section and check in at the registration area.   Wear comfortable clothing and clothing appropriate for easy access to any Portacath or PICC line.   We strive to give you quality time with your provider. You may need to reschedule your appointment if you arrive late (15 or more minutes).  Arriving late affects you and other patients whose appointments are after yours.  Also, if you miss three or more appointments without notifying the office, you may be dismissed from the clinic at the providers discretion.      For prescription refill requests, have your pharmacy contact our office and allow 72 hours for refills to be completed.    Today you received the following chemotherapy and/or immunotherapy agent: Zometa, Sacituzumab-govitecan [name]).   To help prevent nausea and vomiting after your treatment, we encourage you to take your nausea medication as directed.  BELOW ARE SYMPTOMS THAT SHOULD BE REPORTED IMMEDIATELY: *FEVER GREATER THAN 100.4 F (38 C) OR HIGHER *CHILLS OR SWEATING *NAUSEA AND VOMITING THAT IS NOT CONTROLLED WITH YOUR NAUSEA MEDICATION *UNUSUAL SHORTNESS OF BREATH *UNUSUAL BRUISING OR BLEEDING *URINARY PROBLEMS (pain or burning when urinating, or frequent urination) *BOWEL PROBLEMS (unusual diarrhea, constipation, pain near the anus) TENDERNESS IN MOUTH AND THROAT WITH OR WITHOUT PRESENCE OF ULCERS (sore throat, sores in mouth, or a toothache) UNUSUAL RASH, SWELLING OR PAIN  UNUSUAL VAGINAL DISCHARGE OR ITCHING   Items with * indicate a potential emergency and should be followed up as soon as possible or go to the Emergency Department if any problems should occur.  Please show the CHEMOTHERAPY ALERT CARD or  IMMUNOTHERAPY ALERT CARD at check-in to the Emergency Department and triage nurse.  Should you have questions after your visit or need to cancel or reschedule your appointment, please contact [facility]  Dept: [phone]  and follow the prompts.  Office hours are 8:00 a.m. to 4:30 p.m. Monday - Friday. Please note that voicemails left after 4:00 p.m. may not be returned until the following business day.  We are closed weekends and major holidays. You have access to a nurse at all times for urgent questions. Please call the main number to the clinic Dept: [phone] and follow the prompts.   For any non-urgent questions, you may also contact your provider using MyChart. We now offer e-Visits for anyone 63 and older to request care online for non-urgent symptoms. For details visit [email or web address].   Also download the MyChart app! Go to the app store, search "MyChart", open the app, select [facility], and log in with your MyChart username and password.  Due to Covid, a mask is required upon entering the hospital/clinic. If you do not have a mask, one will be given to you upon arrival. For doctor visits, patients may have 1 support person aged 63 or older with them. For treatment visits, patients cannot have anyone with them due to current Covid guidelines and our immunocompromised population.   Zoledronic Acid Injection (Hypercalcemia, Oncology) What is this medication? ZOLEDRONIC ACID (ZOE le dron ik AS id) slows calcium loss from bones. It high calcium levels in the blood from some kinds of cancer. It may be used in other people at risk for bone loss. This medicine may be  used for other purposes; ask your health care provider or pharmacist if you have questions. COMMON BRAND NAME(S): Zometa What should I tell my care team before I take this medication? They need to know if you have any of these conditions: cancer dehydration dental disease kidney  disease liver disease low levels of calcium in the blood lung or breathing disease (asthma) receiving steroids like dexamethasone or prednisone an unusual or allergic reaction to zoledronic acid, other medicines, foods, dyes, or preservatives pregnant or trying to get pregnant breast-feeding How should I use this medication? This drug is injected into a vein. It is given by a health care provider in a hospital or clinic setting. Talk to your health care provider about the use of this drug in children. Special care may be needed. Overdosage: If you think you have taken too much of this medicine contact a poison control center or emergency room at once. NOTE: This medicine is only for you. Do not share this medicine with others. What if I miss a dose? Keep appointments for follow-up doses. It is important not to miss your dose. Call your health care provider if you are unable to keep an appointment. What may interact with this medication? certain antibiotics given by injection NSAIDs, medicines for pain and inflammation, like ibuprofen or naproxen some diuretics like bumetanide, furosemide teriparatide thalidomide This list may not describe all possible interactions. Give your health care provider a list of all the medicines, herbs, non-prescription drugs, or dietary supplements you use. Also tell them if you smoke, drink alcohol, or use illegal drugs. Some items may interact with your medicine. What should I watch for while using this medication? Visit your health care provider for regular checks on your progress. It may be some time before you see the benefit from this drug. Some people who take this drug have severe bone, joint, or muscle pain. This drug may also increase your risk for jaw problems or a broken thigh bone. Tell your health care provider right away if you have severe pain in your jaw, bones, joints, or muscles. Tell you health care provider if you have any pain that does not  go away or that gets worse. Tell your dentist and dental surgeon that you are taking this drug. You should not have major dental surgery while on this drug. See your dentist to have a dental exam and fix any dental problems before starting this drug. Take good care of your teeth while on this drug. Make sure you see your dentist for regular follow-up appointments. You should make sure you get enough calcium and vitamin D while you are taking this drug. Discuss the foods you eat and the vitamins you take with your health care provider. Check with your health care provider if you have severe diarrhea, nausea, and vomiting, or if you sweat a lot. The loss of too much body fluid may make it dangerous for you to take this drug. You may need blood work done while you are taking this drug. Do not become pregnant while taking this drug. Women should inform their health care provider if they wish to become pregnant or think they might be pregnant. There is potential for serious harm to an unborn child. Talk to your health care provider for more information. What side effects may I notice from receiving this medication? Side effects that you should report to your doctor or health care provider as soon as possible: allergic reactions (skin rash, itching or hives; swelling  of the face, lips, or tongue) bone pain infection (fever, chills, cough, sore throat, pain or trouble passing urine) jaw pain, especially after dental work joint pain kidney injury (trouble passing urine or change in the amount of urine) low blood pressure (dizziness; feeling faint or lightheaded, falls; unusually weak or tired) low calcium levels (fast heartbeat; muscle cramps or pain; pain, tingling, or numbness in the hands or feet; seizures) low magnesium levels (fast, irregular heartbeat; muscle cramp or pain; muscle weakness; tremors; seizures) low red blood cell counts (trouble breathing; feeling faint; lightheaded, falls; unusually  weak or tired) muscle pain redness, blistering, peeling, or loosening of the skin, including inside the mouth severe diarrhea swelling of the ankles, feet, hands trouble breathing Side effects that usually do not require medical attention (report to your doctor or health care provider if they continue or are bothersome): anxious constipation coughing depressed mood eye irritation, itching, or pain fever general ill feeling or flu-like symptoms nausea pain, redness, or irritation at site where injected trouble sleeping This list may not describe all possible side effects. Call your doctor for medical advice about side effects. You may report side effects to FDA at 1-800-FDA-1088. Where should I keep my medication? This drug is given in a hospital or clinic. It will not be stored at home. NOTE: This sheet is a summary. It may not cover all possible information. If you have questions about this medicine, talk to your doctor, pharmacist, or health care provider.  2022 Elsevier/Gold Standard (2021-04-17 00:00:00)

## 2021-08-03 NOTE — Progress Notes (Signed)
Pt observed for 30 minutes post infusion, stable for discharge. VSS

## 2021-08-04 LAB — CANCER ANTIGEN 27.29: CA 27.29: 51.8 U/mL — ABNORMAL HIGH (ref 0.0–38.6)

## 2021-08-11 ENCOUNTER — Other Ambulatory Visit: Payer: Self-pay | Admitting: Nurse Practitioner

## 2021-08-11 DIAGNOSIS — E559 Vitamin D deficiency, unspecified: Secondary | ICD-10-CM

## 2021-08-12 ENCOUNTER — Encounter: Payer: Self-pay | Admitting: Hematology

## 2021-08-14 ENCOUNTER — Other Ambulatory Visit: Payer: Self-pay

## 2021-08-17 ENCOUNTER — Inpatient Hospital Stay: Payer: Medicare Other

## 2021-08-17 ENCOUNTER — Encounter: Payer: Self-pay | Admitting: Hematology

## 2021-08-17 ENCOUNTER — Inpatient Hospital Stay (HOSPITAL_BASED_OUTPATIENT_CLINIC_OR_DEPARTMENT_OTHER): Payer: Medicare Other | Admitting: Hematology

## 2021-08-17 ENCOUNTER — Other Ambulatory Visit: Payer: Self-pay

## 2021-08-17 ENCOUNTER — Inpatient Hospital Stay: Payer: Medicare Other | Attending: Hematology

## 2021-08-17 VITALS — BP 146/84 | HR 80 | Temp 98.7°F | Resp 20

## 2021-08-17 VITALS — BP 146/80 | HR 96 | Temp 98.3°F | Resp 18 | Ht 67.0 in | Wt 229.1 lb

## 2021-08-17 DIAGNOSIS — I5042 Chronic combined systolic (congestive) and diastolic (congestive) heart failure: Secondary | ICD-10-CM | POA: Insufficient documentation

## 2021-08-17 DIAGNOSIS — H548 Legal blindness, as defined in USA: Secondary | ICD-10-CM | POA: Diagnosis not present

## 2021-08-17 DIAGNOSIS — E559 Vitamin D deficiency, unspecified: Secondary | ICD-10-CM

## 2021-08-17 DIAGNOSIS — Z171 Estrogen receptor negative status [ER-]: Secondary | ICD-10-CM | POA: Diagnosis not present

## 2021-08-17 DIAGNOSIS — C7951 Secondary malignant neoplasm of bone: Secondary | ICD-10-CM | POA: Insufficient documentation

## 2021-08-17 DIAGNOSIS — G620 Drug-induced polyneuropathy: Secondary | ICD-10-CM | POA: Diagnosis not present

## 2021-08-17 DIAGNOSIS — Z95828 Presence of other vascular implants and grafts: Secondary | ICD-10-CM

## 2021-08-17 DIAGNOSIS — I110 Hypertensive heart disease with heart failure: Secondary | ICD-10-CM | POA: Diagnosis not present

## 2021-08-17 DIAGNOSIS — C50512 Malignant neoplasm of lower-outer quadrant of left female breast: Secondary | ICD-10-CM | POA: Diagnosis not present

## 2021-08-17 DIAGNOSIS — Z5112 Encounter for antineoplastic immunotherapy: Secondary | ICD-10-CM | POA: Insufficient documentation

## 2021-08-17 DIAGNOSIS — H3552 Pigmentary retinal dystrophy: Secondary | ICD-10-CM | POA: Insufficient documentation

## 2021-08-17 DIAGNOSIS — J449 Chronic obstructive pulmonary disease, unspecified: Secondary | ICD-10-CM | POA: Insufficient documentation

## 2021-08-17 DIAGNOSIS — E119 Type 2 diabetes mellitus without complications: Secondary | ICD-10-CM | POA: Diagnosis not present

## 2021-08-17 DIAGNOSIS — Z170 Estrogen receptor positive status [ER+]: Secondary | ICD-10-CM | POA: Diagnosis not present

## 2021-08-17 DIAGNOSIS — Z5189 Encounter for other specified aftercare: Secondary | ICD-10-CM | POA: Diagnosis not present

## 2021-08-17 DIAGNOSIS — Z7189 Other specified counseling: Secondary | ICD-10-CM

## 2021-08-17 LAB — COMPREHENSIVE METABOLIC PANEL
ALT: 31 U/L (ref 0–44)
AST: 22 U/L (ref 15–41)
Albumin: 4.1 g/dL (ref 3.5–5.0)
Alkaline Phosphatase: 102 U/L (ref 38–126)
Anion gap: 8 (ref 5–15)
BUN: 10 mg/dL (ref 8–23)
CO2: 26 mmol/L (ref 22–32)
Calcium: 9.7 mg/dL (ref 8.9–10.3)
Chloride: 106 mmol/L (ref 98–111)
Creatinine, Ser: 0.72 mg/dL (ref 0.44–1.00)
GFR, Estimated: >60 mL/min (ref >60)
Glucose, Bld: 171 mg/dL — ABNORMAL HIGH (ref 70–99)
Potassium: 4.1 mmol/L (ref 3.5–5.1)
Sodium: 140 mmol/L (ref 135–145)
Total Bilirubin: 0.6 mg/dL (ref 0.3–1.2)
Total Protein: 7.5 g/dL (ref 6.5–8.1)

## 2021-08-17 LAB — CBC WITH DIFFERENTIAL/PLATELET
Abs Immature Granulocytes: 0.02 10*3/uL (ref 0.00–0.07)
Basophils Absolute: 0 10*3/uL (ref 0.0–0.1)
Basophils Relative: 1 %
Eosinophils Absolute: 0.3 10*3/uL (ref 0.0–0.5)
Eosinophils Relative: 7 %
HCT: 32.9 % — ABNORMAL LOW (ref 36.0–46.0)
Hemoglobin: 11 g/dL — ABNORMAL LOW (ref 12.0–15.0)
Immature Granulocytes: 1 %
Lymphocytes Relative: 20 %
Lymphs Abs: 0.8 10*3/uL (ref 0.7–4.0)
MCH: 31.3 pg (ref 26.0–34.0)
MCHC: 33.4 g/dL (ref 30.0–36.0)
MCV: 93.7 fL (ref 80.0–100.0)
Monocytes Absolute: 0.4 10*3/uL (ref 0.1–1.0)
Monocytes Relative: 10 %
Neutro Abs: 2.5 10*3/uL (ref 1.7–7.7)
Neutrophils Relative %: 61 %
Platelets: 301 10*3/uL (ref 150–400)
RBC: 3.51 MIL/uL — ABNORMAL LOW (ref 3.87–5.11)
RDW: 13.7 % (ref 11.5–15.5)
WBC: 4 10*3/uL (ref 4.0–10.5)
nRBC: 0 % (ref 0.0–0.2)

## 2021-08-17 LAB — VITAMIN D 25 HYDROXY (VIT D DEFICIENCY, FRACTURES): Vit D, 25-Hydroxy: 20.86 ng/mL — ABNORMAL LOW (ref 30–100)

## 2021-08-17 MED ORDER — SODIUM CHLORIDE 0.9 % IV SOLN
10.0000 mg/kg | Freq: Once | INTRAVENOUS | Status: AC
  Administered 2021-08-17: 1080 mg via INTRAVENOUS
  Filled 2021-08-17: qty 108

## 2021-08-17 MED ORDER — SODIUM CHLORIDE 0.9% FLUSH
10.0000 mL | INTRAVENOUS | Status: DC | PRN
  Administered 2021-08-17: 10 mL via INTRAVENOUS

## 2021-08-17 MED ORDER — SODIUM CHLORIDE 0.9 % IV SOLN
150.0000 mg | Freq: Once | INTRAVENOUS | Status: AC
  Administered 2021-08-17: 150 mg via INTRAVENOUS
  Filled 2021-08-17: qty 150

## 2021-08-17 MED ORDER — SODIUM CHLORIDE 0.9 % IV SOLN: Freq: Once | INTRAVENOUS | Status: AC

## 2021-08-17 MED ORDER — PALONOSETRON HCL INJECTION 0.25 MG/5ML
0.2500 mg | Freq: Once | INTRAVENOUS | Status: AC
  Administered 2021-08-17: 0.25 mg via INTRAVENOUS
  Filled 2021-08-17: qty 5

## 2021-08-17 MED ORDER — SODIUM CHLORIDE 0.9 % IV SOLN
10.0000 mg | Freq: Once | INTRAVENOUS | Status: AC
  Administered 2021-08-17: 10 mg via INTRAVENOUS
  Filled 2021-08-17: qty 10

## 2021-08-17 MED ORDER — ACETAMINOPHEN 325 MG PO TABS
650.0000 mg | ORAL_TABLET | Freq: Once | ORAL | Status: AC
  Administered 2021-08-17: 650 mg via ORAL
  Filled 2021-08-17: qty 2

## 2021-08-17 MED ORDER — HEPARIN SOD (PORK) LOCK FLUSH 100 UNIT/ML IV SOLN
500.0000 [IU] | Freq: Once | INTRAVENOUS | Status: AC | PRN
  Administered 2021-08-17: 500 [IU]

## 2021-08-17 MED ORDER — DIPHENHYDRAMINE HCL 50 MG/ML IJ SOLN
50.0000 mg | Freq: Once | INTRAMUSCULAR | Status: AC
  Administered 2021-08-17: 50 mg via INTRAVENOUS
  Filled 2021-08-17: qty 1

## 2021-08-17 MED ORDER — FAMOTIDINE 20 MG IN NS 100 ML IVPB
20.0000 mg | Freq: Once | INTRAVENOUS | Status: AC
  Administered 2021-08-17: 20 mg via INTRAVENOUS
  Filled 2021-08-17: qty 100

## 2021-08-17 MED ORDER — SODIUM CHLORIDE 0.9% FLUSH
10.0000 mL | INTRAVENOUS | Status: DC | PRN
  Administered 2021-08-17: 10 mL

## 2021-08-17 NOTE — Progress Notes (Addendum)
[place]   Telephone:([phone]) [phone] Fax:(336) [phone]   Clinic Follow up Note   Patient Care Team: [name], MD as PCP - General (Family Medicine) [name], MD as PCP - Cardiology (Cardiology) [name], MD as Consulting Physician (Hematology) [name], MD as Consulting Physician (General Surgery)  Date of Service:  08/17/2021  CHIEF COMPLAINT: f/u of metastatic breast cancer  CURRENT THERAPY:  Kristin Ward, given on days 1 and 8 every 21 day cycle, starting 07/03/21  ASSESSMENT & PLAN:  Kristin Ward is a 66 y.o. female with   1. Left breast invasive ductal carcinoma, stage IB, p(T1cN0M0), Triple negative, Grade 3, in 2018, metastatic disease to right gluteal mass/iliac and diffuse node metastasis in 08/2020 ER 20% weakly + PR/HER2 (0) negative, PD-L1 0%, genetics (-) -She was initially diagnosed in 05/2017. She has high risk for recurrence due to triple negative disease. She is s/p left breast lumpectomy, adjuvant chemo TC and Radiation.  -Genetic testing was negative for pathogenetic mutations.  -Due to recent right gluteal pain, she underwent biopsy on 09/02/20 which showed metastatic carcinoma, consistent with breast primary. ER 20% positive, ER and HER2 negative.  -She started weekly Taxol on 09/26/20 for 3 weeks on/ 1 week off. Due to worsened neuropathy, Taxol stopped after C4 on 12/18/20. I switched her to Xeloda on 01/01/21 at 1570m in the AM and 20058min the PM 2 weeks on/1 week off. We increased to full dose Xeloda, 2000 mg BID, on 01/22/21. -Restaging CT CAP 06/18/21 showed worsening lymphadenopathy in retroperitoneal and right inguinal. Bone scan 06/20/21 was negative. She also developed palpable right inguinal lymph nodes, confirmed on physical exam 06/22/21. -she switched to [place]n 07/03/21. She again experienced nausea as well as severe diarrhea after cycle 2, but was able to recover well  -lab reviewed, overall stable. She reports she  recovered well from flu. Adequate to proceed with treatment today. We will plan to repeat scan after C4.    2. Right hip pain, Right iliac bone met, Vit D deficiency -She had radiating right hip pain since 2020 from right iliac wing mass -She completed Palliative radiation to right iliac 09/04/20 - 09/22/20 with Dr. MoLisbeth Renshawand her pain is much improved.  -Given localized bone met, she started Zometa q3m2monthn 11/20/20. Most recent dose 08/03/21 -vit D has remained low, most recently 30.4 on 07/12/21.   3. G2 peripheral Neuropathy  -S/p C2D8 Taxol she developed mild tingling in her hands and feet.  -She uses ice bags at home as needed.  -She continues to have moderate numbness in her hands and light tingling in her feet. She can take B12 or acupuncture.  -Taxol stopped after 12/18/20. Her neuropathy is improving.  -Overall stable   4. DM, HTN, COPD, retinitis pigmentosa with limited vision, Chronic combined systolic and diastolic heart failure, EF 45-50% -Continue to follow-up with PCP Dr LalRedmond Schoold cardiologist Dr HarEllyn Hackshe is legally blind -She recently tried insulin, but it made her sick, and she has stopped. She is on oral diabetic medicine and checking her sugars at home. BG overall improved since starting medication.   5. Goal of care discussion  -The patient understands the goal of care is palliative. -she is full code now      Plan -proceed with C3D1 TroIvette Loyalday, D8 chemo next week followed by pegfil -lab, flush, and Trodelvy in 3 and 4 weeks -f/u in 3 weeks   No problem-specific Assessment &  Plan notes found for this encounter.   SUMMARY OF ONCOLOGIC HISTORY: Oncology History Overview Note  Cancer Staging Malignant neoplasm of lower-outer quadrant of left breast of female, estrogen receptor negative ([place]) Staging form: Breast, AJCC 8th Edition - Clinical stage from 06/06/2017: Stage IB (cT1c, cN0, cM0, G3, ER-, PR-, HER2-) - Signed by [name], MD on  06/15/2017 Nuclear grade: G3 Histologic grading system: 3 grade system - Pathologic stage from 06/26/2017: Stage IB (pT1c, pN0, cM0, G3, ER-, PR-, HER2-) - Signed by [name], MD on 07/10/2017 Neoadjuvant therapy: No Nuclear grade: G3 Multigene prognostic tests performed: None Histologic grading system: 3 grade system Laterality: Left     Malignant neoplasm of lower-outer quadrant of left breast of female, estrogen receptor negative ([place])  05/30/2017 Mammogram   Diagnostic mammo and US IMPRESSION: 1. Highly suspicious 1.4 cm mass in the slightly lower slightly outer left breast -tissue sampling recommended. 2. Indeterminate 0.5 mm mass in the slightly lower slightly outer left breast -tissue sampling recommended. 3. At least 2 left axillary lymph nodes with borderline cortical thickness.   06/06/2017 Initial Biopsy   Diagnosis 1. Breast, left, needle core biopsy, 5:30 o'clock - INVASIVE DUCTAL CARCINOMA, G3 2. Lymph node, needle/core biopsy, left axillary - NO CARCINOMA IDENTIFIED IN ONE LYMPH NODE (0/1)   06/06/2017 Initial Diagnosis   Malignant neoplasm of lower-outer quadrant of left breast of female, estrogen receptor negative ([place])   06/06/2017 Receptors her2   Estrogen Receptor: 0%, NEGATIVE Progesterone Receptor: 0%, NEGATIVE Proliferation Marker Ki67: 70%   06/26/2017 Surgery   LEFT BREAST LUMPECTOMY WITH RADIOACTIVE SEED AND SENTINEL LYMPH NODE BIOPSY ERAS  PATHWAY AND INSERTION PORT-A-CATH By Dr. Barry Dienes on 06/26/17    06/26/2017 Pathology Results   Diagnosis 06/26/17  1. Breast, lumpectomy, Left - INVASIVE DUCTAL CARCINOMA, GRADE III/III, SPANNING 1.2 CM. - THE SURGICAL RESECTION MARGINS ARE NEGATIVE FOR CARCINOMA. - SEE ONCOLOGY TABLE BELOW. 2. Lymph node, sentinel, biopsy, Left axillary #1 - THERE IS NO EVIDENCE OF CARCINOMA IN 1 OF 1 LYMPH NODE (0/1). 3. Lymph node, sentinel, biopsy, Left axillary #2 - THERE IS NO EVIDENCE OF CARCINOMA IN 1 OF 1 LYMPH  NODE (0/1). 4. Lymph node, sentinel, biopsy, Left axillary #3 - THERE IS NO EVIDENCE OF CARCINOMA IN 1 OF 1 LYMPH NODE (0/1).    07/25/2017 - 09/29/2017 Chemotherapy   Adjuvant cytoxan and docetaxel (TC) every 3 weekd for 4 cycles     11/19/2017 - 12/17/2017 Radiation Therapy   Adjuvant breast radiation Left breast treated to 42.5 Gy with 17 fx of 2.5 Gy followed by a boost of 7.5 Gy with 3 fx of 2.5 Gy      02/2019 Procedure   She had PAC removal in 02/2019.    07/23/2020 Imaging   MRI Lumbar Spine 07/23/20 IMPRESSION: 1. No significant disc herniation, spinal canal or neural foraminal stenosis at any level. 2. Moderate facet degenerative changes at L3-4, L4-5 and L5-S1. 3. Multiple enlarged retroperitoneal and right iliac lymph nodes. Recommend correlation with CT of the abdomen and pelvis with contrast.   08/15/2020 Imaging   CT AP 08/15/20  IMPRESSION: 1. Right iliac and periaortic adenopathy is noted concerning for metastatic disease or lymphoma. Also noted is abnormal soft tissue mass anterior and posterior to the right iliac wing concerning for malignancy or metastatic disease. MRI is recommended for further evaluation. 2. Irregular lucency with sclerotic margins is seen involving the right iliac crest concerning for possible lytic lesion. MRI may be performed for  further evaluation. 3. Enlarged fibroid uterus. 4. Small gallstone. 5. Aortic atherosclerosis.   08/22/2020 Pathology Results   FINAL MICROSCOPIC DIAGNOSIS: 08/22/20  A. NEEDLE CORE, RIGHT GLUTEUS MINIMUMUS, BIOPSY:  -  Metastatic carcinoma  -  See comment   COMMENT:  By immunohistochemistry, the neoplastic cells are positive for  cytokeratin-7 and have weak patchy positivity for GATA3 but are negative for cytokeratin-20 and GCDFP.  The combined morphology and immunophenotype, support metastasis from the patient's known breast primary carcinoma.   ADDENDUM:  PROGNOSTIC INDICATOR RESULTS:  The tumor cells  are NEGATIVE for Her2 (0).  Estrogen Receptor:       POSITIVE, 20%, WEAK STAINING  Progesterone Receptor:   NEGATIVE   PD-L1 (0) negative    09/04/2020 - 09/22/2020 Radiation Therapy   Palliative radiation to right iliac 09/04/20 - 09/22/20, Dr. Lisbeth Renshaw   09/26/2020 -  Chemotherapy   first line weekly Taxol 3 weeks on/1 week off starting 09/26/20 --Reduced to 2 weeks on/1 week off starting with C3 due to neuropathy. Stopped after C4 on 12/18/20 due to neuratrophy.  --Switched to Xeloda on 01/01/21 at 1581m in the AM and 20012min the PM for 2 weeks on/1 week off     10/13/2020 Procedure    PAC placement on 10/13/20.    04/10/2021 Imaging   CT CAP  IMPRESSION: 1. Enlarging abdominopelvic retroperitoneal and right inguinal adenopathy, compatible with disease progression. 2. Lytic metastasis in the right iliac wing, similar. 3. Hepatomegaly. 4. Cholelithiasis. 5. Enlarged fibroid uterus. 6.  Aortic atherosclerosis (ICD10-I70.0).   06/18/2021 Imaging   CT CAP  IMPRESSION: 1. Today's study demonstrates progression of metastatic disease as evidence by increased number and size of numerous enlarged lymph nodes in the retroperitoneum, along the right pelvic sidewall, and in the upper right thigh. 2. Osseous metastases in the bony pelvis appear similar to the prior examination. No definite new osseous lesions are otherwise noted. 3. Fibroid uterus again noted. 4. Cholelithiasis without evidence of acute cholecystitis. 5. Aortic atherosclerosis, in addition to left anterior descending coronary artery disease. Please note that although the presence of coronary artery calcium documents the presence of coronary artery disease, the severity of this disease and any potential stenosis cannot be assessed on this non-gated CT examination. Assessment for potential risk factor modification, dietary therapy or pharmacologic therapy may be warranted, if clinically indicated. 6. Additional incidental findings,  as above.   06/20/2021 Imaging   Bone Scan  IMPRESSION: No osseous uptake suspicious for recurrent/progressive metastatic disease.   07/03/2021 -  Chemotherapy   Patient is on Treatment Plan : BREAST METASTATIC Sacituzumab [id number] (TIvette Loyalq21d        INTERVAL HISTORY:  PaAngala Ward is here for a follow up of metastatic breast cancer. She was last seen by me on 07/27/21. She presents to the clinic alone. She reports she is very much recovered from the flu. She notes it took three days to recover with the tamaflu and levaquin I prescribed. She reports she sometimes feels itchy after treatment, but she denies rash. She reports the groin lymph nodes are less prominent now. She reports she is getting back into walking because a friend of hers has moved back to the area. She notes they plan to go to the gym together soon.   All other systems were reviewed with the patient and are negative.  MEDICAL HISTORY:  Past Medical History:  Diagnosis Date   Abnormal x-ray of lungs with single pulmonary nodule 03/15/2016  per records from [place]    Anemia    Asthma    Cholelithiasis 05/19/2017   On CT   Coronary artery calcification seen on CAT scan 05/19/2017   Diabetes mellitus without complication ([place])    type 2   Dilated idiopathic cardiomyopathy ([place]) 12/2015   EF 15-20%. Diagnosed in [facility]   Headache    NONE RECENT   Hypertension    Lymph nodes enlarged 07/25/2020   Malignant neoplasm of lower-outer quadrant of left breast of female, estrogen receptor negative ([place]) 06/11/2017   left breast   Mixed hyperlipidemia 06/17/2018   partially blind    Personal history of chemotherapy 2018-2019   Personal history of radiation therapy 2019   Retinitis pigmentosa    Retroperitoneal lymphadenopathy 07/25/2020   Uveitis     SURGICAL HISTORY: Past Surgical History:  Procedure Laterality Date   brain cyst removed  2007   to help with headaches  per patient , aspirated    BREAST BIOPSY Left 06/06/2017   x2   BREAST CYST ASPIRATION Left 06/06/2017   BREAST LUMPECTOMY Left 06/26/2017   BREAST LUMPECTOMY WITH RADIOACTIVE SEED AND SENTINEL LYMPH NODE BIOPSY Left 06/26/2017   Procedure: BREAST LUMPECTOMY WITH RADIOACTIVE SEED AND SENTINEL LYMPH NODE BIOPSY ERAS  PATHWAY;  Surgeon: [name], MD;  Location: [place];  Service: General;  Laterality: Left;  pec block   FINE NEEDLE ASPIRATION Left 02/23/2019   Procedure: FINE NEEDLE ASPIRATION;  Surgeon: [name], MD;  Location: [place];  Service: General;  Laterality: Left;  Asiration of left breast seroma    IR IMAGING GUIDED PORT INSERTION  10/13/2020   NASAL TURBINATE REDUCTION     PORT-A-CATH REMOVAL Right 02/23/2019   Procedure: REMOVAL PORT-A-CATH;  Surgeon: [name], MD;  Location: [place];  Service: General;  Laterality: Right;   PORTACATH PLACEMENT N/A 06/26/2017   Procedure: INSERTION PORT-A-CATH;  Surgeon: [name], MD;  Location: [place];  Service: General;  Laterality: N/A;   TRANSTHORACIC ECHOCARDIOGRAM  12/2015   A) [facility] May 2017: Mild concentric LVH. Global hypokinesis. GR daily. EF 18% severe LA dilation. Mitral annular dilatation with papillary muscle dysfunction and moderate MR. Dilated IVC consistent with elevated RAP.   TRANSTHORACIC ECHOCARDIOGRAM  05/2017; 08/2017   a) Mild concentric LVH.  EF 45 to 50% with diffuse hypokinesis.  GR 1 DD.  Mild aortic root dilation.;; b)  Mild LVH EF 45 to 50%.  Diffuse HK.  No significant valve disease.  No significant change.    I have reviewed the social history and family history with the patient and they are unchanged from previous note.  ALLERGIES:  is allergic to morphine and related, latex, tylenol with codeine #3 [acetaminophen-codeine], and penicillins.  MEDICATIONS:  Current Outpatient Medications  Medication Sig Dispense Refill   Accu-Chek FastClix Lancets  MISC TEST TWICE A DAY. PT USES AN ACCU-CHEK GUIDE ME METER 102 each 2   aspirin 81 MG chewable tablet Chew 81 mg by mouth daily.     atorvastatin (LIPITOR) 20 MG tablet Take 1 tablet (20 mg total) by mouth daily. 90 tablet 3   carvedilol (COREG) 12.5 MG tablet TAKE 1 TABLET (12.5 MG TOTAL) BY MOUTH 2 (TWO) TIMES DAILY WITH A MEAL. 180 tablet 1   Cholecalciferol (D3-1000 PO) Take by mouth.     Continuous Blood Gluc Sensor (FREESTYLE LIBRE 14 DAY SENSOR) MISC 1 each by Does not apply route every 14 (fourteen) days. (Patient not  taking: Reported on 06/25/2021) 3 each 4   diclofenac (VOLTAREN) 75 MG EC tablet Take 1 tablet (75 mg total) by mouth 2 (two) times daily. (Patient not taking: Reported on 06/25/2021) 60 tablet 1   furosemide (LASIX) 20 MG tablet Take 20 mg by mouth daily. Patient is unsure of dose     glucose blood (ACCU-CHEK GUIDE) test strip Use as instructed to test blood sugar 2 times a day 100 strip 2   ibuprofen (ADVIL) 800 MG tablet Take 1 tablet (800 mg total) by mouth every 8 (eight) hours as needed. 30 tablet 2   lidocaine-prilocaine (EMLA) cream Apply 1 application topically as needed. 30 g 0   linaclotide (LINZESS) 72 MCG capsule Take 1 capsule (72 mcg total) by mouth daily before breakfast. MUST HAVE APPOINTMENT FOR REFILLS 30 capsule 1   metFORMIN (GLUCOPHAGE) 500 MG tablet Take 1 tablet (500 mg total) by mouth 2 (two) times daily with a meal. 180 tablet 1   Multiple Vitamins-Minerals (WOMENS MULTI PO) Take by mouth.     RYBELSUS 3 MG TABS TAKE 1 TABLET BY MOUTH DAILY WITH BREAKFAST. 30 tablet 3   Saccharomyces boulardii (PROBIOTIC) 250 MG CAPS Take by mouth. (Patient not taking: No sig reported)     sacubitril-valsartan (ENTRESTO) 97-103 MG Take 1 tablet by mouth 2 (two) times daily. 180 tablet 3   spironolactone (ALDACTONE) 25 MG tablet Take 1 tablet (25 mg total) by mouth daily. 90 tablet 3   vitamin B-12 (CYANOCOBALAMIN) 500 MCG tablet Take 500 mcg by mouth daily.      vitamin C (ASCORBIC ACID) 500 MG tablet Take 1,000 mg 2 (two) times daily by mouth.     No current facility-administered medications for this visit.   Facility-Administered Medications Ordered in Other Visits  Medication Dose Route Frequency Provider Last Rate Last Admin   heparin lock flush 100 unit/mL  500 Units Intracatheter Once PRN [name], MD       sodium chloride flush (NS) 0.9 % injection 10 mL  10 mL Intracatheter PRN [name], MD        PHYSICAL EXAMINATION: ECOG PERFORMANCE STATUS: 1 - Symptomatic but completely ambulatory  Vitals:   08/17/21 0952  BP: (!) 146/80  Pulse: 96  Resp: 18  Temp: 98.3 F (36.8 C)  SpO2: 99%   Wt Readings from Last 3 Encounters:  08/17/21 229 lb 1.6 oz (103.9 kg)  08/03/21 229 lb 3.2 oz (104 kg)  07/27/21 225 lb 4.8 oz (102.2 kg)     GENERAL:alert, no distress and comfortable SKIN: skin color normal, no rashes or significant lesions EYES: normal, Conjunctiva are pink and non-injected, sclera clear  NEURO: alert & oriented x 3 with fluent speech  LABORATORY DATA:  I have reviewed the data as listed CBC Latest Ref Rng & Units 08/17/2021 08/03/2021 07/27/2021  WBC 4.0 - 10.5 K/uL 4.0 5.5 5.0  Hemoglobin 12.0 - 15.0 g/dL 11.0(L) 10.4(L) 10.7(L)  Hematocrit 36.0 - 46.0 % 32.9(L) 30.7(L) 31.9(L)  Platelets 150 - 400 K/uL 301 323 355     CMP Latest Ref Rng & Units 08/17/2021 08/03/2021 07/27/2021  Glucose 70 - 99 mg/dL 171(H) 131(H) 126(H)  BUN 8 - 23 mg/dL '10 12 9  ' Creatinine 0.44 - 1.00 mg/dL 0.72 0.72 0.82  Sodium 135 - 145 mmol/L 140 144 135  Potassium 3.5 - 5.1 mmol/L 4.1 3.8 3.9  Chloride 98 - 111 mmol/L 106 111 102  CO2 22 - 32 mmol/L 26 26 24  Calcium 8.9 - 10.3 mg/dL 9.7 9.3 9.2  Total Protein 6.5 - 8.1 g/dL 7.5 7.1 7.7  Total Bilirubin 0.3 - 1.2 mg/dL 0.6 0.4 0.5  Alkaline Phos 38 - 126 U/L 102 79 113  AST 15 - 41 U/L '22 15 24  ' ALT 0 - 44 U/L '31 12 27      ' RADIOGRAPHIC STUDIES: I have personally reviewed the  radiological images as listed and agreed with the findings in the report. No results found.    No orders of the defined types were placed in this encounter.  All questions were answered. The patient knows to call the clinic with any problems, questions or concerns. No barriers to learning was detected. The total time spent in the appointment was 30 minutes.     [name], MD 08/17/2021   I, Wilburn Mylar, am acting as scribe for [name], MD.   I have reviewed the above documentation for accuracy and completeness, and I agree with the above.

## 2021-08-17 NOTE — Patient Instructions (Signed)
[facility]  Discharge Instructions: Thank you for choosing [facility] to provide your oncology and hematology care.   If you have a lab appointment with the [facility], please go directly to the [facility] and check in at the registration area.   Wear comfortable clothing and clothing appropriate for easy access to any Portacath or PICC line.   We strive to give you quality time with your provider. You may need to reschedule your appointment if you arrive late (15 or more minutes).  Arriving late affects you and other patients whose appointments are after yours.  Also, if you miss three or more appointments without notifying the office, you may be dismissed from the clinic at the providers discretion.      For prescription refill requests, have your pharmacy contact our office and allow 72 hours for refills to be completed.    Today you received the following chemotherapy and/or immunotherapy agent: Sacituzumab-govitecan Kristin Ward)   To help prevent nausea and vomiting after your treatment, we encourage you to take your nausea medication as directed.  BELOW ARE SYMPTOMS THAT SHOULD BE REPORTED IMMEDIATELY: *FEVER GREATER THAN 100.4 F (38 C) OR HIGHER *CHILLS OR SWEATING *NAUSEA AND VOMITING THAT IS NOT CONTROLLED WITH YOUR NAUSEA MEDICATION *UNUSUAL SHORTNESS OF BREATH *UNUSUAL BRUISING OR BLEEDING *URINARY PROBLEMS (pain or burning when urinating, or frequent urination) *BOWEL PROBLEMS (unusual diarrhea, constipation, pain near the anus) TENDERNESS IN MOUTH AND THROAT WITH OR WITHOUT PRESENCE OF ULCERS (sore throat, sores in mouth, or a toothache) UNUSUAL RASH, SWELLING OR PAIN  UNUSUAL VAGINAL DISCHARGE OR ITCHING   Items with * indicate a potential emergency and should be followed up as soon as possible or go to the Emergency Department if any problems should occur.  Please show the CHEMOTHERAPY ALERT CARD or IMMUNOTHERAPY ALERT  CARD at check-in to the Emergency Department and triage nurse.  Should you have questions after your visit or need to cancel or reschedule your appointment, please contact [facility]  Dept: [phone]  and follow the prompts.  Office hours are 8:00 a.m. to 4:30 p.m. Monday - Friday. Please note that voicemails left after 4:00 p.m. may not be returned until the following business day.  We are closed weekends and major holidays. You have access to a nurse at all times for urgent questions. Please call the main number to the clinic Dept: [phone] and follow the prompts.   For any non-urgent questions, you may also contact your provider using MyChart. We now offer e-Visits for anyone 50 and older to request care online for non-urgent symptoms. For details visit [email or web address].   Also download the MyChart app! Go to the app store, search "MyChart", open the app, select [facility], and log in with your MyChart username and password.  Due to Covid, a mask is required upon entering the hospital/clinic. If you do not have a mask, one will be given to you upon arrival. For doctor visits, patients may have 1 support person aged 51 or older with them. For treatment visits, patients cannot have anyone with them due to current Covid guidelines and our immunocompromised population.

## 2021-08-19 ENCOUNTER — Other Ambulatory Visit: Payer: Self-pay | Admitting: Nurse Practitioner

## 2021-08-19 DIAGNOSIS — E559 Vitamin D deficiency, unspecified: Secondary | ICD-10-CM

## 2021-08-19 MED ORDER — ERGOCALCIFEROL 1.25 MG (50000 UT) PO CAPS: 50000.0000 [IU] | ORAL_CAPSULE | ORAL | 0 refills | Status: DC

## 2021-08-20 ENCOUNTER — Telehealth: Payer: Self-pay | Admitting: Hematology

## 2021-08-20 ENCOUNTER — Other Ambulatory Visit: Payer: Self-pay | Admitting: Nurse Practitioner

## 2021-08-20 DIAGNOSIS — Z7189 Other specified counseling: Secondary | ICD-10-CM

## 2021-08-20 DIAGNOSIS — C50512 Malignant neoplasm of lower-outer quadrant of left female breast: Secondary | ICD-10-CM

## 2021-08-20 MED ORDER — ONDANSETRON HCL 8 MG PO TABS: 8.0000 mg | ORAL_TABLET | Freq: Three times a day (TID) | ORAL | 1 refills | Status: DC | PRN

## 2021-08-20 MED ORDER — PROCHLORPERAZINE MALEATE 10 MG PO TABS: 10.0000 mg | ORAL_TABLET | Freq: Four times a day (QID) | ORAL | 1 refills | Status: DC | PRN

## 2021-08-20 NOTE — Telephone Encounter (Signed)
Scheduled follow-up appointments per 1/6 los. Patient is aware.

## 2021-08-23 MED FILL — Fosaprepitant Dimeglumine For IV Infusion 150 MG (Base Eq): INTRAVENOUS | Qty: 5 | Status: AC

## 2021-08-24 ENCOUNTER — Inpatient Hospital Stay: Payer: Medicare Other

## 2021-08-24 ENCOUNTER — Encounter: Payer: Self-pay | Admitting: Hematology

## 2021-08-24 ENCOUNTER — Other Ambulatory Visit: Payer: Self-pay

## 2021-08-24 VITALS — BP 152/90 | HR 96 | Temp 98.0°F | Resp 18 | Wt 234.0 lb

## 2021-08-24 DIAGNOSIS — J449 Chronic obstructive pulmonary disease, unspecified: Secondary | ICD-10-CM | POA: Diagnosis not present

## 2021-08-24 DIAGNOSIS — E119 Type 2 diabetes mellitus without complications: Secondary | ICD-10-CM | POA: Diagnosis not present

## 2021-08-24 DIAGNOSIS — Z5189 Encounter for other specified aftercare: Secondary | ICD-10-CM | POA: Diagnosis not present

## 2021-08-24 DIAGNOSIS — Z171 Estrogen receptor negative status [ER-]: Secondary | ICD-10-CM

## 2021-08-24 DIAGNOSIS — H548 Legal blindness, as defined in USA: Secondary | ICD-10-CM | POA: Diagnosis not present

## 2021-08-24 DIAGNOSIS — H3552 Pigmentary retinal dystrophy: Secondary | ICD-10-CM | POA: Diagnosis not present

## 2021-08-24 DIAGNOSIS — C7951 Secondary malignant neoplasm of bone: Secondary | ICD-10-CM | POA: Diagnosis not present

## 2021-08-24 DIAGNOSIS — E559 Vitamin D deficiency, unspecified: Secondary | ICD-10-CM | POA: Diagnosis not present

## 2021-08-24 DIAGNOSIS — Z5112 Encounter for antineoplastic immunotherapy: Secondary | ICD-10-CM | POA: Diagnosis not present

## 2021-08-24 DIAGNOSIS — G620 Drug-induced polyneuropathy: Secondary | ICD-10-CM | POA: Diagnosis not present

## 2021-08-24 DIAGNOSIS — I5042 Chronic combined systolic (congestive) and diastolic (congestive) heart failure: Secondary | ICD-10-CM | POA: Diagnosis not present

## 2021-08-24 DIAGNOSIS — Z7189 Other specified counseling: Secondary | ICD-10-CM

## 2021-08-24 DIAGNOSIS — Z95828 Presence of other vascular implants and grafts: Secondary | ICD-10-CM

## 2021-08-24 DIAGNOSIS — I110 Hypertensive heart disease with heart failure: Secondary | ICD-10-CM | POA: Diagnosis not present

## 2021-08-24 DIAGNOSIS — Z170 Estrogen receptor positive status [ER+]: Secondary | ICD-10-CM | POA: Diagnosis not present

## 2021-08-24 DIAGNOSIS — C50512 Malignant neoplasm of lower-outer quadrant of left female breast: Secondary | ICD-10-CM | POA: Diagnosis not present

## 2021-08-24 LAB — COMPREHENSIVE METABOLIC PANEL
ALT: 52 U/L — ABNORMAL HIGH (ref 0–44)
AST: 37 U/L (ref 15–41)
Albumin: 4.1 g/dL (ref 3.5–5.0)
Alkaline Phosphatase: 100 U/L (ref 38–126)
Anion gap: 9 (ref 5–15)
BUN: 15 mg/dL (ref 8–23)
CO2: 24 mmol/L (ref 22–32)
Calcium: 9 mg/dL (ref 8.9–10.3)
Chloride: 108 mmol/L (ref 98–111)
Creatinine, Ser: 0.75 mg/dL (ref 0.44–1.00)
GFR, Estimated: >60 mL/min (ref >60)
Glucose, Bld: 204 mg/dL — ABNORMAL HIGH (ref 70–99)
Potassium: 4 mmol/L (ref 3.5–5.1)
Sodium: 141 mmol/L (ref 135–145)
Total Bilirubin: 0.4 mg/dL (ref 0.3–1.2)
Total Protein: 7.6 g/dL (ref 6.5–8.1)

## 2021-08-24 LAB — CBC WITH DIFFERENTIAL/PLATELET
Abs Immature Granulocytes: 0.02 10*3/uL (ref 0.00–0.07)
Basophils Absolute: 0 10*3/uL (ref 0.0–0.1)
Basophils Relative: 1 %
Eosinophils Absolute: 0.2 10*3/uL (ref 0.0–0.5)
Eosinophils Relative: 9 %
HCT: 31 % — ABNORMAL LOW (ref 36.0–46.0)
Hemoglobin: 10.3 g/dL — ABNORMAL LOW (ref 12.0–15.0)
Immature Granulocytes: 1 %
Lymphocytes Relative: 41 %
Lymphs Abs: 0.9 10*3/uL (ref 0.7–4.0)
MCH: 31.3 pg (ref 26.0–34.0)
MCHC: 33.2 g/dL (ref 30.0–36.0)
MCV: 94.2 fL (ref 80.0–100.0)
Monocytes Absolute: 0.4 10*3/uL (ref 0.1–1.0)
Monocytes Relative: 17 %
Neutro Abs: 0.7 10*3/uL — ABNORMAL LOW (ref 1.7–7.7)
Neutrophils Relative %: 31 %
Platelets: 295 10*3/uL (ref 150–400)
RBC: 3.29 MIL/uL — ABNORMAL LOW (ref 3.87–5.11)
RDW: 13.4 % (ref 11.5–15.5)
WBC: 2.1 10*3/uL — ABNORMAL LOW (ref 4.0–10.5)
nRBC: 0 % (ref 0.0–0.2)

## 2021-08-24 MED ORDER — SODIUM CHLORIDE 0.9% FLUSH
10.0000 mL | INTRAVENOUS | Status: DC | PRN
  Administered 2021-08-24: 10 mL

## 2021-08-24 MED ORDER — SODIUM CHLORIDE 0.9 % IV SOLN: Freq: Once | INTRAVENOUS | Status: AC

## 2021-08-24 MED ORDER — HEPARIN SOD (PORK) LOCK FLUSH 100 UNIT/ML IV SOLN
500.0000 [IU] | Freq: Once | INTRAVENOUS | Status: AC | PRN
  Administered 2021-08-24: 500 [IU]

## 2021-08-24 MED ORDER — FAMOTIDINE 20 MG IN NS 100 ML IVPB
20.0000 mg | Freq: Once | INTRAVENOUS | Status: AC
  Administered 2021-08-24: 20 mg via INTRAVENOUS
  Filled 2021-08-24: qty 100

## 2021-08-24 MED ORDER — SODIUM CHLORIDE 0.9% FLUSH: 10.0000 mL | INTRAVENOUS | Status: DC | PRN

## 2021-08-24 MED ORDER — DIPHENHYDRAMINE HCL 50 MG/ML IJ SOLN
50.0000 mg | Freq: Once | INTRAMUSCULAR | Status: AC
  Administered 2021-08-24: 50 mg via INTRAVENOUS
  Filled 2021-08-24: qty 1

## 2021-08-24 MED ORDER — ACETAMINOPHEN 325 MG PO TABS
650.0000 mg | ORAL_TABLET | Freq: Once | ORAL | Status: AC
  Administered 2021-08-24: 650 mg via ORAL
  Filled 2021-08-24: qty 2

## 2021-08-24 MED ORDER — SODIUM CHLORIDE 0.9 % IV SOLN
10.0000 mg | Freq: Once | INTRAVENOUS | Status: AC
  Administered 2021-08-24: 10 mg via INTRAVENOUS
  Filled 2021-08-24: qty 10

## 2021-08-24 MED ORDER — SODIUM CHLORIDE 0.9 % IV SOLN
150.0000 mg | Freq: Once | INTRAVENOUS | Status: AC
  Administered 2021-08-24: 150 mg via INTRAVENOUS
  Filled 2021-08-24: qty 150

## 2021-08-24 MED ORDER — ATROPINE SULFATE 1 MG/ML IV SOLN: 0.5000 mg | Freq: Once | INTRAVENOUS | Status: DC | PRN

## 2021-08-24 MED ORDER — PALONOSETRON HCL INJECTION 0.25 MG/5ML
0.2500 mg | Freq: Once | INTRAVENOUS | Status: AC
  Administered 2021-08-24: 0.25 mg via INTRAVENOUS
  Filled 2021-08-24: qty 5

## 2021-08-24 MED ORDER — SODIUM CHLORIDE 0.9 % IV SOLN
10.0000 mg/kg | Freq: Once | INTRAVENOUS | Status: AC
  Administered 2021-08-24: 1080 mg via INTRAVENOUS
  Filled 2021-08-24: qty 108

## 2021-08-24 NOTE — Progress Notes (Signed)
Ok to treat with ANC of 0.7 per Dr.Feng

## 2021-08-24 NOTE — Patient Instructions (Signed)
[facility]COL[facility]  Discharge Instructions: Thank you for choosing [facility] to provide your oncology and hematology care.   If you have a lab appointment with the [facility], please go directly to the [facility] and check in at the registration area.   Wear comfortable clothing and clothing appropriate for easy access to any Portacath or PICC line.   We strive to give you quality time with your provider. You may need to reschedule your appointment if you arrive late (15 or more minutes).  Arriving late affects you and other patients whose appointments are after yours.  Also, if you miss three or more appointments without notifying the office, you may be dismissed from the clinic at the providers discretion.      For prescription refill requests, have your pharmacy contact our office and allow 72 hours for refills to be completed.    Today you received the following chemotherapy and/or immunotherapy agent: Sacituzumab-govitecan [name])   To help prevent nausea and vomiting after your treatment, we encourage you to take your nausea medication as directed.  BELOW ARE SYMPTOMS THAT SHOULD BE REPORTED IMMEDIATELY: *FEVER GREATER THAN 100.4 F (38 C) OR HIGHER *CHILLS OR SWEATING *NAUSEA AND VOMITING THAT IS NOT CONTROLLED WITH YOUR NAUSEA MEDICATION *UNUSUAL SHORTNESS OF BREATH *UNUSUAL BRUISING OR BLEEDING *URINARY PROBLEMS (pain or burning when urinating, or frequent urination) *BOWEL PROBLEMS (unusual diarrhea, constipation, pain near the anus) TENDERNESS IN MOUTH AND THROAT WITH OR WITHOUT PRESENCE OF ULCERS (sore throat, sores in mouth, or a toothache) UNUSUAL RASH, SWELLING OR PAIN  UNUSUAL VAGINAL DISCHARGE OR ITCHING   Items with * indicate a potential emergency and should be followed up as soon as possible or go to the Emergency Department if any problems should occur.  Please show the CHEMOTHERAPY ALERT CARD or IMMUNOTHERAPY ALERT  CARD at check-in to the Emergency Department and triage nurse.  Should you have questions after your visit or need to cancel or reschedule your appointment, please contact [facility]  Dept: [phone]  and follow the prompts.  Office hours are 8:00 a.m. to 4:30 p.m. Monday - Friday. Please note that voicemails left after 4:00 p.m. may not be returned until the following business day.  We are closed weekends and major holidays. You have access to a nurse at all times for urgent questions. Please call the main number to the clinic Dept: [phone] and follow the prompts.   For any non-urgent questions, you may also contact your provider using MyChart. We now offer e-Visits for anyone 14 and older to request care online for non-urgent symptoms. For details visit [email or web address].   Also download the MyChart app! Go to the app store, search "MyChart", open the app, select [facility], and log in with your MyChart username and password.  Due to Covid, a mask is required upon entering the hospital/clinic. If you do not have a mask, one will be given to you upon arrival. For doctor visits, patients may have 1 support person aged 77 or older with them. For treatment visits, patients cannot have anyone with them due to current Covid guidelines and our immunocompromised population.

## 2021-08-25 ENCOUNTER — Inpatient Hospital Stay: Payer: Medicare Other

## 2021-08-25 ENCOUNTER — Other Ambulatory Visit: Payer: Self-pay

## 2021-08-25 VITALS — BP 148/95 | HR 98 | Temp 96.8°F | Resp 19

## 2021-08-25 DIAGNOSIS — I110 Hypertensive heart disease with heart failure: Secondary | ICD-10-CM | POA: Diagnosis not present

## 2021-08-25 DIAGNOSIS — E559 Vitamin D deficiency, unspecified: Secondary | ICD-10-CM | POA: Diagnosis not present

## 2021-08-25 DIAGNOSIS — Z170 Estrogen receptor positive status [ER+]: Secondary | ICD-10-CM | POA: Diagnosis not present

## 2021-08-25 DIAGNOSIS — C7951 Secondary malignant neoplasm of bone: Secondary | ICD-10-CM | POA: Diagnosis not present

## 2021-08-25 DIAGNOSIS — H548 Legal blindness, as defined in USA: Secondary | ICD-10-CM | POA: Diagnosis not present

## 2021-08-25 DIAGNOSIS — J449 Chronic obstructive pulmonary disease, unspecified: Secondary | ICD-10-CM | POA: Diagnosis not present

## 2021-08-25 DIAGNOSIS — Z171 Estrogen receptor negative status [ER-]: Secondary | ICD-10-CM

## 2021-08-25 DIAGNOSIS — Z5112 Encounter for antineoplastic immunotherapy: Secondary | ICD-10-CM | POA: Diagnosis not present

## 2021-08-25 DIAGNOSIS — G620 Drug-induced polyneuropathy: Secondary | ICD-10-CM | POA: Diagnosis not present

## 2021-08-25 DIAGNOSIS — E119 Type 2 diabetes mellitus without complications: Secondary | ICD-10-CM | POA: Diagnosis not present

## 2021-08-25 DIAGNOSIS — C50512 Malignant neoplasm of lower-outer quadrant of left female breast: Secondary | ICD-10-CM

## 2021-08-25 DIAGNOSIS — H3552 Pigmentary retinal dystrophy: Secondary | ICD-10-CM | POA: Diagnosis not present

## 2021-08-25 DIAGNOSIS — I5042 Chronic combined systolic (congestive) and diastolic (congestive) heart failure: Secondary | ICD-10-CM | POA: Diagnosis not present

## 2021-08-25 DIAGNOSIS — Z5189 Encounter for other specified aftercare: Secondary | ICD-10-CM | POA: Diagnosis not present

## 2021-08-25 DIAGNOSIS — Z7189 Other specified counseling: Secondary | ICD-10-CM

## 2021-08-25 MED ORDER — PEGFILGRASTIM-BMEZ 6 MG/0.6ML ~~LOC~~ SOSY
6.0000 mg | PREFILLED_SYRINGE | Freq: Once | SUBCUTANEOUS | Status: AC
  Administered 2021-08-25: 6 mg via SUBCUTANEOUS
  Filled 2021-08-25: qty 0.6

## 2021-08-25 NOTE — Patient Instructions (Signed)

## 2021-08-31 ENCOUNTER — Encounter: Payer: Self-pay | Admitting: Hematology

## 2021-09-06 MED FILL — Dexamethasone Sodium Phosphate Inj 100 MG/10ML: INTRAMUSCULAR | Qty: 1 | Status: AC

## 2021-09-06 MED FILL — Fosaprepitant Dimeglumine For IV Infusion 150 MG (Base Eq): INTRAVENOUS | Qty: 5 | Status: AC

## 2021-09-07 ENCOUNTER — Inpatient Hospital Stay: Payer: Medicare Other

## 2021-09-07 ENCOUNTER — Inpatient Hospital Stay (HOSPITAL_BASED_OUTPATIENT_CLINIC_OR_DEPARTMENT_OTHER): Payer: Medicare Other | Admitting: Hematology

## 2021-09-07 ENCOUNTER — Other Ambulatory Visit: Payer: Self-pay

## 2021-09-07 ENCOUNTER — Encounter: Payer: Self-pay | Admitting: Hematology

## 2021-09-07 VITALS — BP 160/82 | HR 81 | Temp 98.2°F | Resp 17

## 2021-09-07 VITALS — BP 138/89 | HR 97 | Temp 98.4°F | Resp 17 | Wt 226.5 lb

## 2021-09-07 DIAGNOSIS — E559 Vitamin D deficiency, unspecified: Secondary | ICD-10-CM | POA: Diagnosis not present

## 2021-09-07 DIAGNOSIS — G620 Drug-induced polyneuropathy: Secondary | ICD-10-CM | POA: Diagnosis not present

## 2021-09-07 DIAGNOSIS — Z7189 Other specified counseling: Secondary | ICD-10-CM

## 2021-09-07 DIAGNOSIS — C7951 Secondary malignant neoplasm of bone: Secondary | ICD-10-CM | POA: Diagnosis not present

## 2021-09-07 DIAGNOSIS — Z171 Estrogen receptor negative status [ER-]: Secondary | ICD-10-CM | POA: Diagnosis not present

## 2021-09-07 DIAGNOSIS — Z5189 Encounter for other specified aftercare: Secondary | ICD-10-CM | POA: Diagnosis not present

## 2021-09-07 DIAGNOSIS — C50512 Malignant neoplasm of lower-outer quadrant of left female breast: Secondary | ICD-10-CM

## 2021-09-07 DIAGNOSIS — H3552 Pigmentary retinal dystrophy: Secondary | ICD-10-CM | POA: Diagnosis not present

## 2021-09-07 DIAGNOSIS — E119 Type 2 diabetes mellitus without complications: Secondary | ICD-10-CM | POA: Diagnosis not present

## 2021-09-07 DIAGNOSIS — Z170 Estrogen receptor positive status [ER+]: Secondary | ICD-10-CM | POA: Diagnosis not present

## 2021-09-07 DIAGNOSIS — I5042 Chronic combined systolic (congestive) and diastolic (congestive) heart failure: Secondary | ICD-10-CM | POA: Diagnosis not present

## 2021-09-07 DIAGNOSIS — H548 Legal blindness, as defined in USA: Secondary | ICD-10-CM | POA: Diagnosis not present

## 2021-09-07 DIAGNOSIS — J449 Chronic obstructive pulmonary disease, unspecified: Secondary | ICD-10-CM | POA: Diagnosis not present

## 2021-09-07 DIAGNOSIS — Z95828 Presence of other vascular implants and grafts: Secondary | ICD-10-CM

## 2021-09-07 DIAGNOSIS — Z5112 Encounter for antineoplastic immunotherapy: Secondary | ICD-10-CM | POA: Diagnosis not present

## 2021-09-07 DIAGNOSIS — I110 Hypertensive heart disease with heart failure: Secondary | ICD-10-CM | POA: Diagnosis not present

## 2021-09-07 LAB — CBC WITH DIFFERENTIAL/PLATELET
Abs Immature Granulocytes: 0.16 10*3/uL — ABNORMAL HIGH (ref 0.00–0.07)
Basophils Absolute: 0 10*3/uL (ref 0.0–0.1)
Basophils Relative: 1 %
Eosinophils Absolute: 0.2 10*3/uL (ref 0.0–0.5)
Eosinophils Relative: 3 %
HCT: 33.8 % — ABNORMAL LOW (ref 36.0–46.0)
Hemoglobin: 11.1 g/dL — ABNORMAL LOW (ref 12.0–15.0)
Immature Granulocytes: 2 %
Lymphocytes Relative: 12 %
Lymphs Abs: 0.9 10*3/uL (ref 0.7–4.0)
MCH: 30.7 pg (ref 26.0–34.0)
MCHC: 32.8 g/dL (ref 30.0–36.0)
MCV: 93.6 fL (ref 80.0–100.0)
Monocytes Absolute: 0.6 10*3/uL (ref 0.1–1.0)
Monocytes Relative: 7 %
Neutro Abs: 6 10*3/uL (ref 1.7–7.7)
Neutrophils Relative %: 75 %
Platelets: 235 10*3/uL (ref 150–400)
RBC: 3.61 MIL/uL — ABNORMAL LOW (ref 3.87–5.11)
RDW: 14.7 % (ref 11.5–15.5)
WBC: 7.9 10*3/uL (ref 4.0–10.5)
nRBC: 0 % (ref 0.0–0.2)

## 2021-09-07 LAB — COMPREHENSIVE METABOLIC PANEL
ALT: 17 U/L (ref 0–44)
AST: 14 U/L — ABNORMAL LOW (ref 15–41)
Albumin: 4.1 g/dL (ref 3.5–5.0)
Alkaline Phosphatase: 123 U/L (ref 38–126)
Anion gap: 6 (ref 5–15)
BUN: 12 mg/dL (ref 8–23)
CO2: 27 mmol/L (ref 22–32)
Calcium: 9.7 mg/dL (ref 8.9–10.3)
Chloride: 104 mmol/L (ref 98–111)
Creatinine, Ser: 0.76 mg/dL (ref 0.44–1.00)
GFR, Estimated: >60 mL/min (ref >60)
Glucose, Bld: 332 mg/dL — ABNORMAL HIGH (ref 70–99)
Potassium: 4.3 mmol/L (ref 3.5–5.1)
Sodium: 137 mmol/L (ref 135–145)
Total Bilirubin: 0.5 mg/dL (ref 0.3–1.2)
Total Protein: 7.2 g/dL (ref 6.5–8.1)

## 2021-09-07 MED ORDER — DEXAMETHASONE SODIUM PHOSPHATE 10 MG/ML IJ SOLN
5.0000 mg | Freq: Once | INTRAMUSCULAR | Status: AC
  Administered 2021-09-07: 5 mg via INTRAVENOUS
  Filled 2021-09-07: qty 1

## 2021-09-07 MED ORDER — PANTOPRAZOLE SODIUM 40 MG PO TBEC: 40.0000 mg | DELAYED_RELEASE_TABLET | Freq: Every day | ORAL | 0 refills | Status: DC

## 2021-09-07 MED ORDER — SODIUM CHLORIDE 0.9 % IV SOLN
900.0000 mg | Freq: Once | INTRAVENOUS | Status: AC
  Administered 2021-09-07: 900 mg via INTRAVENOUS
  Filled 2021-09-07: qty 90

## 2021-09-07 MED ORDER — SODIUM CHLORIDE 0.9 % IV SOLN
150.0000 mg | Freq: Once | INTRAVENOUS | Status: AC
  Administered 2021-09-07: 150 mg via INTRAVENOUS
  Filled 2021-09-07: qty 150

## 2021-09-07 MED ORDER — HEPARIN SOD (PORK) LOCK FLUSH 100 UNIT/ML IV SOLN
500.0000 [IU] | Freq: Once | INTRAVENOUS | Status: AC | PRN
  Administered 2021-09-07: 500 [IU]

## 2021-09-07 MED ORDER — PALONOSETRON HCL INJECTION 0.25 MG/5ML
0.2500 mg | Freq: Once | INTRAVENOUS | Status: AC
  Administered 2021-09-07: 0.25 mg via INTRAVENOUS
  Filled 2021-09-07: qty 5

## 2021-09-07 MED ORDER — SODIUM CHLORIDE 0.9 % IV SOLN
5.0000 mg | Freq: Once | INTRAVENOUS | Status: DC
  Filled 2021-09-07: qty 0.5

## 2021-09-07 MED ORDER — FAMOTIDINE 20 MG IN NS 100 ML IVPB
20.0000 mg | Freq: Once | INTRAVENOUS | Status: AC
  Administered 2021-09-07: 20 mg via INTRAVENOUS
  Filled 2021-09-07: qty 100

## 2021-09-07 MED ORDER — DEXAMETHASONE SODIUM PHOSPHATE 10 MG/ML IJ SOLN: 5.0000 mg | Freq: Once | INTRAMUSCULAR | Status: DC

## 2021-09-07 MED ORDER — SODIUM CHLORIDE 0.9% FLUSH
10.0000 mL | INTRAVENOUS | Status: DC | PRN
  Administered 2021-09-07: 10 mL

## 2021-09-07 MED ORDER — IBUPROFEN 800 MG PO TABS: 800.0000 mg | ORAL_TABLET | Freq: Three times a day (TID) | ORAL | 2 refills | Status: DC | PRN

## 2021-09-07 MED ORDER — DIPHENHYDRAMINE HCL 50 MG/ML IJ SOLN
50.0000 mg | Freq: Once | INTRAMUSCULAR | Status: AC
  Administered 2021-09-07: 50 mg via INTRAVENOUS
  Filled 2021-09-07: qty 1

## 2021-09-07 MED ORDER — SODIUM CHLORIDE 0.9% FLUSH
10.0000 mL | INTRAVENOUS | Status: DC | PRN
  Administered 2021-09-07: 10 mL via INTRAVENOUS

## 2021-09-07 MED ORDER — ACETAMINOPHEN 325 MG PO TABS
650.0000 mg | ORAL_TABLET | Freq: Once | ORAL | Status: AC
  Administered 2021-09-07: 650 mg via ORAL
  Filled 2021-09-07: qty 2

## 2021-09-07 MED ORDER — SODIUM CHLORIDE 0.9 % IV SOLN: Freq: Once | INTRAVENOUS | Status: AC

## 2021-09-07 NOTE — Addendum Note (Signed)
Addended by: Neysa Hotter on: 09/07/2021 04:34 PM   Modules accepted: Orders

## 2021-09-07 NOTE — Progress Notes (Signed)
Pt declined to stay for full 30 minute wait post Trodelvy. Pt observed for 15 minutes. No complaints or complications report. VSS upon discharge.

## 2021-09-07 NOTE — Progress Notes (Signed)
[facility]   [phone] [phone]   Clinic Follow up Note   Patient Care Team: [name], MD as PCP - General (Family Medicine) [name], MD as PCP - Cardiology (Cardiology) [name], MD as Consulting Physician (Hematology) [name], MD as Consulting Physician (General Surgery)  Date of Service:  09/07/2021  CHIEF COMPLAINT: f/u of metastatic breast cancer  CURRENT THERAPY:  [name], given on days 1 and 8 every 21 day cycle, starting 07/03/21  ASSESSMENT & PLAN:  Kristin Ward is a 66 y.o. female with   1. Left breast invasive ductal carcinoma, stage IB, p(T1cN0M0), Triple negative, Grade 3, in 2018, metastatic disease to right gluteal mass/iliac and diffuse node metastasis in 08/2020 ER 20% weakly + PR/HER2 (0) negative, PD-L1 0%, genetics (-) -diagnosed with triple negative breast cancer in 05/2017. She is s/p left breast lumpectomy, adjuvant chemo TC and Radiation.  -Genetic testing was negative for pathogenetic mutations.  -Due to new right gluteal pain, she underwent biopsy on 09/02/20 which showed metastatic carcinoma, consistent with breast primary. ER 20% positive, ER and HER2 negative.  -She started weekly Taxol on 09/26/20 for 3 weeks on/ 1 week off. Due to worsened neuropathy, Taxol stopped after C4 on 12/18/20. I switched her to Xeloda on 01/01/21 at 158m in the AM and 20050min the PM 2 weeks on/1 week off. We increased to full dose Xeloda, 2000 mg BID, on 01/22/21. -Restaging CT CAP 06/18/21 showed worsening lymphadenopathy in retroperitoneal and right inguinal. Bone scan 06/20/21 was negative. She also developed palpable right inguinal lymph nodes, confirmed on physical exam 06/22/21. -she switched to [address] 07/03/21. She again experienced nausea as well as severe diarrhea after cycle 2, but was able to recover well  -lab reviewed, overall stable. Adequate to proceed with treatment today. We will plan to repeat  CT and bone scans after C4; I ordered today.  -due to tolerance issue, will slightly reduce chemo today from this cycle   2. Symptom Management: heartburn -I called in protonix   2. Right hip pain, Right iliac bone met, Vit D deficiency -She had radiating right hip pain since 2020 from right iliac wing mass -She completed Palliative radiation to right iliac 09/04/20 - 09/22/20 with Dr. MoLisbeth Renshawand her pain is much improved.  -Given localized bone met, she started Zometa q3m31monthn 11/20/20. Most recent dose 08/03/21 -vit D has remained low, most recently 30.4 on 07/12/21.   3. G2 peripheral Neuropathy  -S/p C2D8 Taxol she developed mild tingling in her hands and feet.  -She uses ice bags at home as needed.  -She continues to have moderate numbness in her hands and light tingling in her feet. She can take B12 or acupuncture.  -Taxol stopped after 12/18/20. Her neuropathy is improving.  -Overall stable   4. DM, HTN, COPD, retinitis pigmentosa with limited vision, Chronic combined systolic and diastolic heart failure, EF 45-50% -Continue to follow-up with PCP Dr LalRedmond Schoold cardiologist Dr HarEllyn Hackshe is legally blind -She recently tried insulin, but it made her sick, and she has stopped. She is on oral diabetic medicine and checking her sugars at home. BG overall improved since starting medication.   5. Goal of care discussion  -The patient understands the goal of care is palliative. -she is full code now      Plan -proceed with C4D1 Trodelvy today, D8 chemo next week followed by pegfil, will reduce chemo by 10%  -I  called in protonix and ibuprofen (she only use occasionally for sinus issue) -lab, flush, and Trodelvy in 3 and 4 weeks -f/u in 3 weeks with CT and bone scans several days before   No problem-specific Assessment & Plan notes found for this encounter.   SUMMARY OF ONCOLOGIC HISTORY: Oncology History Overview Note  Cancer Staging Malignant neoplasm of lower-outer  quadrant of left breast of female, estrogen receptor negative ([place]) Staging form: Breast, AJCC 8th Edition - Clinical stage from 06/06/2017: Stage IB (cT1c, cN0, cM0, G3, ER-, PR-, HER2-) - Signed by [name], MD on 06/15/2017 Nuclear grade: G3 Histologic grading system: 3 grade system - Pathologic stage from 06/26/2017: Stage IB (pT1c, pN0, cM0, G3, ER-, PR-, HER2-) - Signed by [name], MD on 07/10/2017 Neoadjuvant therapy: No Nuclear grade: G3 Multigene prognostic tests performed: None Histologic grading system: 3 grade system Laterality: Left     Malignant neoplasm of lower-outer quadrant of left breast of female, estrogen receptor negative ([place])  05/30/2017 Mammogram   Diagnostic mammo and US IMPRESSION: 1. Highly suspicious 1.4 cm mass in the slightly lower slightly outer left breast -tissue sampling recommended. 2. Indeterminate 0.5 mm mass in the slightly lower slightly outer left breast -tissue sampling recommended. 3. At least 2 left axillary lymph nodes with borderline cortical thickness.   06/06/2017 Initial Biopsy   Diagnosis 1. Breast, left, needle core biopsy, 5:30 o'clock - INVASIVE DUCTAL CARCINOMA, G3 2. Lymph node, needle/core biopsy, left axillary - NO CARCINOMA IDENTIFIED IN ONE LYMPH NODE (0/1)   06/06/2017 Initial Diagnosis   Malignant neoplasm of lower-outer quadrant of left breast of female, estrogen receptor negative ([place])   06/06/2017 Receptors her2   Estrogen Receptor: 0%, NEGATIVE Progesterone Receptor: 0%, NEGATIVE Proliferation Marker Ki67: 70%   06/26/2017 Surgery   LEFT BREAST LUMPECTOMY WITH RADIOACTIVE SEED AND SENTINEL LYMPH NODE BIOPSY ERAS  PATHWAY AND INSERTION PORT-A-CATH By Dr. Barry Dienes on 06/26/17    06/26/2017 Pathology Results   Diagnosis 06/26/17  1. Breast, lumpectomy, Left - INVASIVE DUCTAL CARCINOMA, GRADE III/III, SPANNING 1.2 CM. - THE SURGICAL RESECTION MARGINS ARE NEGATIVE FOR CARCINOMA. - SEE ONCOLOGY TABLE  BELOW. 2. Lymph node, sentinel, biopsy, Left axillary #1 - THERE IS NO EVIDENCE OF CARCINOMA IN 1 OF 1 LYMPH NODE (0/1). 3. Lymph node, sentinel, biopsy, Left axillary #2 - THERE IS NO EVIDENCE OF CARCINOMA IN 1 OF 1 LYMPH NODE (0/1). 4. Lymph node, sentinel, biopsy, Left axillary #3 - THERE IS NO EVIDENCE OF CARCINOMA IN 1 OF 1 LYMPH NODE (0/1).    07/25/2017 - 09/29/2017 Chemotherapy   Adjuvant cytoxan and docetaxel (TC) every 3 weekd for 4 cycles     11/19/2017 - 12/17/2017 Radiation Therapy   Adjuvant breast radiation Left breast treated to 42.5 Gy with 17 fx of 2.5 Gy followed by a boost of 7.5 Gy with 3 fx of 2.5 Gy      02/2019 Procedure   She had PAC removal in 02/2019.    07/23/2020 Imaging   MRI Lumbar Spine 07/23/20 IMPRESSION: 1. No significant disc herniation, spinal canal or neural foraminal stenosis at any level. 2. Moderate facet degenerative changes at L3-4, L4-5 and L5-S1. 3. Multiple enlarged retroperitoneal and right iliac lymph nodes. Recommend correlation with CT of the abdomen and pelvis with contrast.   08/15/2020 Imaging   CT AP 08/15/20  IMPRESSION: 1. Right iliac and periaortic adenopathy is noted concerning for metastatic disease or lymphoma. Also noted is abnormal soft tissue mass anterior and posterior to  the right iliac wing concerning for malignancy or metastatic disease. MRI is recommended for further evaluation. 2. Irregular lucency with sclerotic margins is seen involving the right iliac crest concerning for possible lytic lesion. MRI may be performed for further evaluation. 3. Enlarged fibroid uterus. 4. Small gallstone. 5. Aortic atherosclerosis.   08/22/2020 Pathology Results   FINAL MICROSCOPIC DIAGNOSIS: 08/22/20  A. NEEDLE CORE, RIGHT GLUTEUS MINIMUMUS, BIOPSY:  -  Metastatic carcinoma  -  See comment   COMMENT:  By immunohistochemistry, the neoplastic cells are positive for  cytokeratin-7 and have weak patchy positivity for GATA3  but are negative for cytokeratin-20 and GCDFP.  The combined morphology and immunophenotype, support metastasis from the patient's known breast primary carcinoma.   ADDENDUM:  PROGNOSTIC INDICATOR RESULTS:  The tumor cells are NEGATIVE for Her2 (0).  Estrogen Receptor:       POSITIVE, 20%, WEAK STAINING  Progesterone Receptor:   NEGATIVE   PD-L1 (0) negative    09/04/2020 - 09/22/2020 Radiation Therapy   Palliative radiation to right iliac 09/04/20 - 09/22/20, Dr. Lisbeth Renshaw   09/26/2020 -  Chemotherapy   first line weekly Taxol 3 weeks on/1 week off starting 09/26/20 --Reduced to 2 weeks on/1 week off starting with C3 due to neuropathy. Stopped after C4 on 12/18/20 due to neuratrophy.  --Switched to Xeloda on 01/01/21 at 1524m in the AM and 20058min the PM for 2 weeks on/1 week off     10/13/2020 Procedure    PAC placement on 10/13/20.    04/10/2021 Imaging   CT CAP  IMPRESSION: 1. Enlarging abdominopelvic retroperitoneal and right inguinal adenopathy, compatible with disease progression. 2. Lytic metastasis in the right iliac wing, similar. 3. Hepatomegaly. 4. Cholelithiasis. 5. Enlarged fibroid uterus. 6.  Aortic atherosclerosis (ICD10-I70.0).   06/18/2021 Imaging   CT CAP  IMPRESSION: 1. Today's study demonstrates progression of metastatic disease as evidence by increased number and size of numerous enlarged lymph nodes in the retroperitoneum, along the right pelvic sidewall, and in the upper right thigh. 2. Osseous metastases in the bony pelvis appear similar to the prior examination. No definite new osseous lesions are otherwise noted. 3. Fibroid uterus again noted. 4. Cholelithiasis without evidence of acute cholecystitis. 5. Aortic atherosclerosis, in addition to left anterior descending coronary artery disease. Please note that although the presence of coronary artery calcium documents the presence of coronary artery disease, the severity of this disease and any potential  stenosis cannot be assessed on this non-gated CT examination. Assessment for potential risk factor modification, dietary therapy or pharmacologic therapy may be warranted, if clinically indicated. 6. Additional incidental findings, as above.   06/20/2021 Imaging   Bone Scan  IMPRESSION: No osseous uptake suspicious for recurrent/progressive metastatic disease.   07/03/2021 -  Chemotherapy   Patient is on Treatment Plan : BREAST METASTATIC Sacituzumab govitecan-hziy (TIvette Loyalq21d        INTERVAL HISTORY:  Kristin Ward is here for a follow up of metastatic breast cancer. She was last seen by me on 08/17/21. She presents to the clinic alone. She reports she had a bad sinus headache yesterday due to the rain. She notes she has seasonal allergies at baseline and assumes the headache is related. Her left eye is also bloodshot from this. She reports heartburn from the chemo that wakes her up at night.   All other systems were reviewed with the patient and are negative.  MEDICAL HISTORY:  Past Medical History:  Diagnosis Date   Abnormal x-ray  of lungs with single pulmonary nodule 03/15/2016   per records from [place]    Anemia    Asthma    Cholelithiasis 05/19/2017   On CT   Coronary artery calcification seen on CAT scan 05/19/2017   Diabetes mellitus without complication ([place])    type 2   Dilated idiopathic cardiomyopathy ([place]) 12/2015   EF 15-20%. Diagnosed in [facility]   Headache    NONE RECENT   Hypertension    Lymph nodes enlarged 07/25/2020   Malignant neoplasm of lower-outer quadrant of left breast of female, estrogen receptor negative ([place]) 06/11/2017   left breast   Mixed hyperlipidemia 06/17/2018   partially blind    Personal history of chemotherapy 2018-2019   Personal history of radiation therapy 2019   Retinitis pigmentosa    Retroperitoneal lymphadenopathy 07/25/2020   Uveitis     SURGICAL HISTORY: Past Surgical History:  Procedure  Laterality Date   brain cyst removed  2007   to help with headaches per patient , aspirated    BREAST BIOPSY Left 06/06/2017   x2   BREAST CYST ASPIRATION Left 06/06/2017   BREAST LUMPECTOMY Left 06/26/2017   BREAST LUMPECTOMY WITH RADIOACTIVE SEED AND SENTINEL LYMPH NODE BIOPSY Left 06/26/2017   Procedure: BREAST LUMPECTOMY WITH RADIOACTIVE SEED AND SENTINEL LYMPH NODE BIOPSY ERAS  PATHWAY;  Surgeon: [name], MD;  Location: [facility];  Service: General;  Laterality: Left;  pec block   FINE NEEDLE ASPIRATION Left 02/23/2019   Procedure: FINE NEEDLE ASPIRATION;  Surgeon: [name], MD;  Location: [facility];  Service: General;  Laterality: Left;  Asiration of left breast seroma    IR IMAGING GUIDED PORT INSERTION  10/13/2020   NASAL TURBINATE REDUCTION     PORT-A-CATH REMOVAL Right 02/23/2019   Procedure: REMOVAL PORT-A-CATH;  Surgeon: [name], MD;  Location: [facility];  Service: General;  Laterality: Right;   PORTACATH PLACEMENT N/A 06/26/2017   Procedure: INSERTION PORT-A-CATH;  Surgeon: [name], MD;  Location: [facility];  Service: General;  Laterality: N/A;   TRANSTHORACIC ECHOCARDIOGRAM  12/2015   A) [facility] May 2017: Mild concentric LVH. Global hypokinesis. GR daily. EF 18% severe LA dilation. Mitral annular dilatation with papillary muscle dysfunction and moderate MR. Dilated IVC consistent with elevated RAP.   TRANSTHORACIC ECHOCARDIOGRAM  05/2017; 08/2017   a) Mild concentric LVH.  EF 45 to 50% with diffuse hypokinesis.  GR 1 DD.  Mild aortic root dilation.;; b)  Mild LVH EF 45 to 50%.  Diffuse HK.  No significant valve disease.  No significant change.    I have reviewed the social history and family history with the patient and they are unchanged from previous note.  ALLERGIES:  is allergic to morphine and related, latex, tylenol with codeine #3 [acetaminophen-codeine], and penicillins.  MEDICATIONS:  Current Outpatient  Medications  Medication Sig Dispense Refill   pantoprazole (PROTONIX) 40 MG tablet Take 1 tablet (40 mg total) by mouth daily. 30 tablet 0   Accu-Chek FastClix Lancets MISC TEST TWICE A DAY. PT USES AN ACCU-CHEK GUIDE ME METER 102 each 2   aspirin 81 MG chewable tablet Chew 81 mg by mouth daily.     atorvastatin (LIPITOR) 20 MG tablet Take 1 tablet (20 mg total) by mouth daily. 90 tablet 3   carvedilol (COREG) 12.5 MG tablet TAKE 1 TABLET (12.5 MG TOTAL) BY MOUTH 2 (TWO) TIMES DAILY WITH A MEAL. 180 tablet 1   Cholecalciferol (D3-1000 PO) Take by  mouth.     Continuous Blood Gluc Sensor (FREESTYLE LIBRE 14 DAY SENSOR) MISC 1 each by Does not apply route every 14 (fourteen) days. (Patient not taking: Reported on 06/25/2021) 3 each 4   ergocalciferol (VITAMIN D2) 1.25 MG (50000 UT) capsule Take 1 capsule (50,000 Units total) by mouth once a week for 12 doses. then take 5,000 units over the counter daily thereafter. 12 capsule 0   furosemide (LASIX) 20 MG tablet Take 20 mg by mouth daily. Patient is unsure of dose     glucose blood (ACCU-CHEK GUIDE) test strip Use as instructed to test blood sugar 2 times a day 100 strip 2   ibuprofen (ADVIL) 800 MG tablet Take 1 tablet (800 mg total) by mouth every 8 (eight) hours as needed. 30 tablet 2   lidocaine-prilocaine (EMLA) cream Apply 1 application topically as needed. 30 g 0   linaclotide (LINZESS) 72 MCG capsule Take 1 capsule (72 mcg total) by mouth daily before breakfast. MUST HAVE APPOINTMENT FOR REFILLS 30 capsule 1   metFORMIN (GLUCOPHAGE) 500 MG tablet Take 1 tablet (500 mg total) by mouth 2 (two) times daily with a meal. 180 tablet 1   Multiple Vitamins-Minerals (WOMENS MULTI PO) Take by mouth.     ondansetron (ZOFRAN) 8 MG tablet Take 1 tablet (8 mg total) by mouth every 8 (eight) hours as needed for nausea or vomiting (begin on day 3 after chemo). 30 tablet 1   prochlorperazine (COMPAZINE) 10 MG tablet Take 1 tablet (10 mg total) by mouth every 6  (six) hours as needed (Nausea or vomiting). 30 tablet 1   RYBELSUS 3 MG TABS TAKE 1 TABLET BY MOUTH DAILY WITH BREAKFAST. 30 tablet 3   sacubitril-valsartan (ENTRESTO) 97-103 MG Take 1 tablet by mouth 2 (two) times daily. 180 tablet 3   spironolactone (ALDACTONE) 25 MG tablet Take 1 tablet (25 mg total) by mouth daily. 90 tablet 3   vitamin B-12 (CYANOCOBALAMIN) 500 MCG tablet Take 500 mcg by mouth daily.     vitamin C (ASCORBIC ACID) 500 MG tablet Take 1,000 mg 2 (two) times daily by mouth.     No current facility-administered medications for this visit.   Facility-Administered Medications Ordered in Other Visits  Medication Dose Route Frequency Provider Last Rate Last Admin   dexamethasone (DECADRON) injection 5 mg  5 mg Intravenous Once [name], MD       heparin lock flush 100 unit/mL  500 Units Intracatheter Once PRN [name], MD       sacituzumab govitecan-hziy (TRODELVY) 900 mg in sodium chloride 0.9 % 250 mL (2.6471 mg/mL) chemo infusion  900 mg Intravenous Once [name], MD       sodium chloride flush (NS) 0.9 % injection 10 mL  10 mL Intracatheter PRN [name], MD        PHYSICAL EXAMINATION: ECOG PERFORMANCE STATUS: 1 - Symptomatic but completely ambulatory  Vitals:   09/07/21 0925 09/07/21 0930  BP:  138/89  Pulse:  97  Resp: 17 17  Temp:  98.4 F (36.9 C)  SpO2:  93%   Wt Readings from Last 3 Encounters:  09/07/21 226 lb 8 oz (102.7 kg)  08/24/21 234 lb (106.1 kg)  08/17/21 229 lb 1.6 oz (103.9 kg)     GENERAL:alert, no distress and comfortable SKIN: skin color normal, no rashes or significant lesions EYES: normal, Conjunctiva are pink and non-injected, sclera clear  NEURO: alert & oriented x 3 with fluent speech  LABORATORY DATA:  I have reviewed the data as listed CBC Latest Ref Rng & Units 09/07/2021 08/24/2021 08/17/2021  WBC 4.0 - 10.5 K/uL 7.9 2.1(L) 4.0  Hemoglobin 12.0 - 15.0 g/dL 11.1(L) 10.3(L) 11.0(L)  Hematocrit 36.0 - 46.0 % 33.8(L) 31.0(L)  32.9(L)  Platelets 150 - 400 K/uL 235 295 301     CMP Latest Ref Rng & Units 09/07/2021 08/24/2021 08/17/2021  Glucose 70 - 99 mg/dL 332(H) 204(H) 171(H)  BUN 8 - 23 mg/dL '12 15 10  ' Creatinine 0.44 - 1.00 mg/dL 0.76 0.75 0.72  Sodium 135 - 145 mmol/L 137 141 140  Potassium 3.5 - 5.1 mmol/L 4.3 4.0 4.1  Chloride 98 - 111 mmol/L 104 108 106  CO2 22 - 32 mmol/L '27 24 26  ' Calcium 8.9 - 10.3 mg/dL 9.7 9.0 9.7  Total Protein 6.5 - 8.1 g/dL 7.2 7.6 7.5  Total Bilirubin 0.3 - 1.2 mg/dL 0.5 0.4 0.6  Alkaline Phos 38 - 126 U/L 123 100 102  AST 15 - 41 U/L 14(L) 37 22  ALT 0 - 44 U/L 17 52(H) 31      RADIOGRAPHIC STUDIES: I have personally reviewed the radiological images as listed and agreed with the findings in the report. No results found.    Orders Placed This Encounter  Procedures   CT CHEST ABDOMEN PELVIS W CONTRAST    Standing Status:   Future    Standing Expiration Date:   09/07/2022    Order Specific Question:   Preferred imaging location?    Answer:   [facility]    Order Specific Question:   Is Oral Contrast requested for this exam?    Answer:   Yes, Per Radiology protocol   NM Bone Scan Whole Body    Standing Status:   Future    Standing Expiration Date:   09/07/2022    Order Specific Question:   If indicated for the ordered procedure, I authorize the administration of a radiopharmaceutical per Radiology protocol    Answer:   Yes    Order Specific Question:   Preferred imaging location?    Answer:   [facility]   All questions were answered. The patient knows to call the clinic with any problems, questions or concerns. No barriers to learning was detected. The total time spent in the appointment was 30 minutes.     [name], MD 09/07/2021   I, Wilburn Mylar, am acting as scribe for [name], MD.   I have reviewed the above documentation for accuracy and completeness, and I agree with the above.

## 2021-09-08 LAB — CANCER ANTIGEN 27.29: CA 27.29: 53.7 U/mL — ABNORMAL HIGH (ref 0.0–38.6)

## 2021-09-11 ENCOUNTER — Telehealth: Payer: Self-pay | Admitting: Hematology

## 2021-09-11 NOTE — Telephone Encounter (Signed)
Left message with follow-up appointments per 1/27 los. °

## 2021-09-13 ENCOUNTER — Ambulatory Visit (INDEPENDENT_AMBULATORY_CARE_PROVIDER_SITE_OTHER): Payer: Medicare Other | Admitting: Physician Assistant

## 2021-09-13 ENCOUNTER — Encounter: Payer: Self-pay | Admitting: Physician Assistant

## 2021-09-13 ENCOUNTER — Other Ambulatory Visit: Payer: Self-pay

## 2021-09-13 VITALS — BP 104/78 | HR 102 | Ht 67.0 in | Wt 223.0 lb

## 2021-09-13 DIAGNOSIS — E1165 Type 2 diabetes mellitus with hyperglycemia: Secondary | ICD-10-CM | POA: Diagnosis not present

## 2021-09-13 DIAGNOSIS — C50512 Malignant neoplasm of lower-outer quadrant of left female breast: Secondary | ICD-10-CM | POA: Diagnosis not present

## 2021-09-13 DIAGNOSIS — Z9221 Personal history of antineoplastic chemotherapy: Secondary | ICD-10-CM

## 2021-09-13 DIAGNOSIS — I5042 Chronic combined systolic (congestive) and diastolic (congestive) heart failure: Secondary | ICD-10-CM

## 2021-09-13 DIAGNOSIS — B3731 Acute candidiasis of vulva and vagina: Secondary | ICD-10-CM | POA: Diagnosis not present

## 2021-09-13 DIAGNOSIS — I10 Essential (primary) hypertension: Secondary | ICD-10-CM

## 2021-09-13 DIAGNOSIS — Z0000 Encounter for general adult medical examination without abnormal findings: Secondary | ICD-10-CM

## 2021-09-13 DIAGNOSIS — Z171 Estrogen receptor negative status [ER-]: Secondary | ICD-10-CM

## 2021-09-13 LAB — POCT CBG (FASTING - GLUCOSE)-MANUAL ENTRY: Glucose Fasting, POC: 368 mg/dL — AB (ref 70–99)

## 2021-09-13 MED ORDER — FLUCONAZOLE 100 MG PO TABS: 200.0000 mg | ORAL_TABLET | Freq: Every day | ORAL | 1 refills | Status: DC

## 2021-09-13 MED ORDER — CARVEDILOL 12.5 MG PO TABS: 12.5000 mg | ORAL_TABLET | Freq: Two times a day (BID) | ORAL | 1 refills | Status: DC

## 2021-09-13 MED FILL — Fosaprepitant Dimeglumine For IV Infusion 150 MG (Base Eq): INTRAVENOUS | Qty: 5 | Status: AC

## 2021-09-13 NOTE — Progress Notes (Signed)
Kristin Ward is a 66 y.o. female who presents for annual wellness visit and follow-up on chronic medical conditions. Reports vaginal itching.     PMH, PSH, SH and FH were reviewed and updated   ROS:  Patient denies anorexia, fever, weight changes, headaches, vision changes, decreased hearing, URI symptoms, breast concerns, chest pain, palpitations, dizziness, syncope, dyspnea on exertion, cough, swelling, nausea, vomiting, diarrhea, constipation, abdominal pain, melena, hematochezia, indigestion/heartburn, hematuria, incontinence, dysuria, vaginal bleeding, discharge, odor or itch, genital lesions, joint pains, numbness, tingling, weakness, tremor, suspicious skin lesions, depression, anxiety, abnormal bleeding/bruising, or enlarged lymph nodes.   PHYSICAL EXAM:  BP 104/78 (BP Location: Right Arm, Patient Position: Sitting)    Pulse (!) 102    Wt 223 lb (101.2 kg)    SpO2 95%    BMI 34.93 kg/m    Physical Exam Constitutional:      General: She is not in acute distress.    Appearance: She is obese. She is not ill-appearing.  HENT:     Right Ear: Tympanic membrane, ear canal and external ear normal.     Left Ear: Tympanic membrane, ear canal and external ear normal.     Nose: No congestion.  Eyes:     Extraocular Movements: Extraocular movements intact.     Conjunctiva/sclera: Conjunctivae normal.     Pupils: Pupils are equal, round, and reactive to light.  Cardiovascular:     Rate and Rhythm: Normal rate and regular rhythm.     Pulses: Normal pulses.     Heart sounds: Normal heart sounds.     Pulmonary:     Effort: Pulmonary effort is normal.     Breath sounds: Normal breath sounds.  Abdominal:     General: Bowel sounds are normal.     Palpations: Abdomen is soft.  Musculoskeletal:     Cervical back: Normal range of motion and neck supple.     Right lower leg: No edema.     Left lower leg: No edema.  Neurological:     Mental Status: She is alert.  Psychiatric:         Mood and Affect: Mood normal.        Behavior: Behavior normal.        Thought Content: Thought content normal.    ASSESSMENT/PLAN:  Will look for glucometer at home.   Last chemo tx tomorrow 09/27/2021 follow up scans      The patient's weight, height, BMI have been recorded in the chart.  I have made referrals, counseling, and provided education to the patient based on review of the above and I have provided the patient with a written personalized care plan for preventive services.     Irene Pap, PA-C   09/13/2021

## 2021-09-14 ENCOUNTER — Encounter: Payer: Self-pay | Admitting: Physician Assistant

## 2021-09-14 ENCOUNTER — Inpatient Hospital Stay: Payer: Medicare Other

## 2021-09-14 ENCOUNTER — Other Ambulatory Visit: Payer: Self-pay

## 2021-09-14 ENCOUNTER — Inpatient Hospital Stay: Payer: Medicare Other | Attending: Hematology

## 2021-09-14 VITALS — BP 152/94 | HR 87 | Temp 98.2°F | Resp 16 | Wt 225.0 lb

## 2021-09-14 DIAGNOSIS — R739 Hyperglycemia, unspecified: Secondary | ICD-10-CM

## 2021-09-14 DIAGNOSIS — C50512 Malignant neoplasm of lower-outer quadrant of left female breast: Secondary | ICD-10-CM | POA: Diagnosis not present

## 2021-09-14 DIAGNOSIS — C7951 Secondary malignant neoplasm of bone: Secondary | ICD-10-CM | POA: Insufficient documentation

## 2021-09-14 DIAGNOSIS — Z7189 Other specified counseling: Secondary | ICD-10-CM

## 2021-09-14 DIAGNOSIS — E1165 Type 2 diabetes mellitus with hyperglycemia: Secondary | ICD-10-CM

## 2021-09-14 DIAGNOSIS — Z9221 Personal history of antineoplastic chemotherapy: Secondary | ICD-10-CM | POA: Insufficient documentation

## 2021-09-14 DIAGNOSIS — Z5189 Encounter for other specified aftercare: Secondary | ICD-10-CM | POA: Insufficient documentation

## 2021-09-14 DIAGNOSIS — Z171 Estrogen receptor negative status [ER-]: Secondary | ICD-10-CM

## 2021-09-14 DIAGNOSIS — E119 Type 2 diabetes mellitus without complications: Secondary | ICD-10-CM | POA: Diagnosis not present

## 2021-09-14 DIAGNOSIS — Z95828 Presence of other vascular implants and grafts: Secondary | ICD-10-CM

## 2021-09-14 DIAGNOSIS — Z5112 Encounter for antineoplastic immunotherapy: Secondary | ICD-10-CM | POA: Insufficient documentation

## 2021-09-14 DIAGNOSIS — G620 Drug-induced polyneuropathy: Secondary | ICD-10-CM | POA: Diagnosis not present

## 2021-09-14 DIAGNOSIS — I10 Essential (primary) hypertension: Secondary | ICD-10-CM | POA: Insufficient documentation

## 2021-09-14 DIAGNOSIS — J449 Chronic obstructive pulmonary disease, unspecified: Secondary | ICD-10-CM | POA: Insufficient documentation

## 2021-09-14 LAB — COMPREHENSIVE METABOLIC PANEL
ALT: 14 U/L (ref 0–44)
AST: 13 U/L — ABNORMAL LOW (ref 15–41)
Albumin: 4.1 g/dL (ref 3.5–5.0)
Alkaline Phosphatase: 100 U/L (ref 38–126)
Anion gap: 8 (ref 5–15)
BUN: 13 mg/dL (ref 8–23)
CO2: 25 mmol/L (ref 22–32)
Calcium: 9.1 mg/dL (ref 8.9–10.3)
Chloride: 105 mmol/L (ref 98–111)
Creatinine, Ser: 0.81 mg/dL (ref 0.44–1.00)
GFR, Estimated: >60 mL/min (ref >60)
Glucose, Bld: 361 mg/dL — ABNORMAL HIGH (ref 70–99)
Potassium: 3.7 mmol/L (ref 3.5–5.1)
Sodium: 138 mmol/L (ref 135–145)
Total Bilirubin: 0.5 mg/dL (ref 0.3–1.2)
Total Protein: 7.3 g/dL (ref 6.5–8.1)

## 2021-09-14 LAB — CBC WITH DIFFERENTIAL/PLATELET
Abs Immature Granulocytes: 0.02 10*3/uL (ref 0.00–0.07)
Basophils Absolute: 0 10*3/uL (ref 0.0–0.1)
Basophils Relative: 1 %
Eosinophils Absolute: 0.1 10*3/uL (ref 0.0–0.5)
Eosinophils Relative: 3 %
HCT: 31 % — ABNORMAL LOW (ref 36.0–46.0)
Hemoglobin: 10.3 g/dL — ABNORMAL LOW (ref 12.0–15.0)
Immature Granulocytes: 1 %
Lymphocytes Relative: 29 %
Lymphs Abs: 0.8 10*3/uL (ref 0.7–4.0)
MCH: 30 pg (ref 26.0–34.0)
MCHC: 33.2 g/dL (ref 30.0–36.0)
MCV: 90.4 fL (ref 80.0–100.0)
Monocytes Absolute: 0.4 10*3/uL (ref 0.1–1.0)
Monocytes Relative: 15 %
Neutro Abs: 1.4 10*3/uL — ABNORMAL LOW (ref 1.7–7.7)
Neutrophils Relative %: 51 %
Platelets: 318 10*3/uL (ref 150–400)
RBC: 3.43 MIL/uL — ABNORMAL LOW (ref 3.87–5.11)
RDW: 14.2 % (ref 11.5–15.5)
WBC: 2.7 10*3/uL — ABNORMAL LOW (ref 4.0–10.5)
nRBC: 0 % (ref 0.0–0.2)

## 2021-09-14 MED ORDER — SODIUM CHLORIDE 0.9 % IV SOLN
900.0000 mg | Freq: Once | INTRAVENOUS | Status: AC
  Administered 2021-09-14: 900 mg via INTRAVENOUS
  Filled 2021-09-14: qty 90

## 2021-09-14 MED ORDER — PALONOSETRON HCL INJECTION 0.25 MG/5ML
0.2500 mg | Freq: Once | INTRAVENOUS | Status: AC
  Administered 2021-09-14: 0.25 mg via INTRAVENOUS
  Filled 2021-09-14: qty 5

## 2021-09-14 MED ORDER — SODIUM CHLORIDE 0.9% FLUSH
10.0000 mL | INTRAVENOUS | Status: DC | PRN
  Administered 2021-09-14: 10 mL

## 2021-09-14 MED ORDER — HEPARIN SOD (PORK) LOCK FLUSH 100 UNIT/ML IV SOLN
500.0000 [IU] | Freq: Once | INTRAVENOUS | Status: AC | PRN
  Administered 2021-09-14: 500 [IU]

## 2021-09-14 MED ORDER — ACETAMINOPHEN 325 MG PO TABS
650.0000 mg | ORAL_TABLET | Freq: Once | ORAL | Status: AC
  Administered 2021-09-14: 650 mg via ORAL
  Filled 2021-09-14: qty 2

## 2021-09-14 MED ORDER — INSULIN ASPART 100 UNIT/ML IJ SOLN
4.0000 [IU] | Freq: Once | INTRAMUSCULAR | Status: AC
  Administered 2021-09-14: 4 [IU] via SUBCUTANEOUS
  Filled 2021-09-14: qty 1

## 2021-09-14 MED ORDER — SODIUM CHLORIDE 0.9% FLUSH
10.0000 mL | INTRAVENOUS | Status: DC | PRN
  Administered 2021-09-14: 10 mL via INTRAVENOUS

## 2021-09-14 MED ORDER — DEXAMETHASONE SODIUM PHOSPHATE 10 MG/ML IJ SOLN
5.0000 mg | Freq: Once | INTRAMUSCULAR | Status: AC
  Administered 2021-09-14: 5 mg via INTRAVENOUS
  Filled 2021-09-14: qty 1

## 2021-09-14 MED ORDER — ATROPINE SULFATE 1 MG/ML IV SOLN
0.5000 mg | Freq: Once | INTRAVENOUS | Status: DC | PRN
  Filled 2021-09-14: qty 1

## 2021-09-14 MED ORDER — SODIUM CHLORIDE 0.9 % IV SOLN: INTRAVENOUS | Status: AC

## 2021-09-14 MED ORDER — INSULIN ASPART 100 UNIT/ML IJ SOLN
6.0000 [IU] | Freq: Once | INTRAMUSCULAR | Status: AC
  Administered 2021-09-14: 6 [IU] via SUBCUTANEOUS
  Filled 2021-09-14: qty 1

## 2021-09-14 MED ORDER — DIPHENHYDRAMINE HCL 50 MG/ML IJ SOLN
50.0000 mg | Freq: Once | INTRAMUSCULAR | Status: AC
  Administered 2021-09-14: 50 mg via INTRAVENOUS
  Filled 2021-09-14: qty 1

## 2021-09-14 MED ORDER — FAMOTIDINE IN NACL 20-0.9 MG/50ML-% IV SOLN
20.0000 mg | Freq: Once | INTRAVENOUS | Status: AC
  Administered 2021-09-14: 20 mg via INTRAVENOUS
  Filled 2021-09-14: qty 50

## 2021-09-14 MED ORDER — SODIUM CHLORIDE 0.9 % IV SOLN
150.0000 mg | Freq: Once | INTRAVENOUS | Status: AC
  Administered 2021-09-14: 150 mg via INTRAVENOUS
  Filled 2021-09-14: qty 150

## 2021-09-14 MED ORDER — SODIUM CHLORIDE 0.9 % IV SOLN: Freq: Once | INTRAVENOUS | Status: AC

## 2021-09-14 NOTE — Progress Notes (Signed)
Pt's BG now 491 post previous insulin SubQ.  Verbal order from Dr. Burr Medico to give Insulin 6units SubQ.  Order placed and spoke with pt regarding monitoring BG 3xday and following up with PCP.

## 2021-09-14 NOTE — Progress Notes (Signed)
17:00 CGG 355 - Dr. Burr Medico notified. OK to d/c patient

## 2021-09-14 NOTE — Progress Notes (Signed)
Per Dr. Burr Medico, ok to treat with ANC 1.4 K/ul.   CBG recheck at 1415, 504 mg/dl. Tammi Sou, RN notified.

## 2021-09-14 NOTE — Progress Notes (Signed)
FSBS recheck 494 at 1330

## 2021-09-14 NOTE — Patient Instructions (Signed)
[facility]  Discharge Instructions: Thank you for choosing [facility] to provide your oncology and hematology care.   If you have a lab appointment with the [facility], please go directly to the [facility] and check in at the registration area.   Wear comfortable clothing and clothing appropriate for easy access to any Portacath or PICC line.   We strive to give you quality time with your provider. You may need to reschedule your appointment if you arrive late (15 or more minutes).  Arriving late affects you and other patients whose appointments are after yours.  Also, if you miss three or more appointments without notifying the office, you may be dismissed from the clinic at the providers discretion.      For prescription refill requests, have your pharmacy contact our office and allow 72 hours for refills to be completed.    Today you received the following chemotherapy and/or immunotherapy agent: Sacituzumab-govitecan [name])   To help prevent nausea and vomiting after your treatment, we encourage you to take your nausea medication as directed.  BELOW ARE SYMPTOMS THAT SHOULD BE REPORTED IMMEDIATELY: *FEVER GREATER THAN 100.4 F (38 C) OR HIGHER *CHILLS OR SWEATING *NAUSEA AND VOMITING THAT IS NOT CONTROLLED WITH YOUR NAUSEA MEDICATION *UNUSUAL SHORTNESS OF BREATH *UNUSUAL BRUISING OR BLEEDING *URINARY PROBLEMS (pain or burning when urinating, or frequent urination) *BOWEL PROBLEMS (unusual diarrhea, constipation, pain near the anus) TENDERNESS IN MOUTH AND THROAT WITH OR WITHOUT PRESENCE OF ULCERS (sore throat, sores in mouth, or a toothache) UNUSUAL RASH, SWELLING OR PAIN  UNUSUAL VAGINAL DISCHARGE OR ITCHING   Items with * indicate a potential emergency and should be followed up as soon as possible or go to the Emergency Department if any problems should occur.  Please show the CHEMOTHERAPY ALERT CARD or IMMUNOTHERAPY ALERT  CARD at check-in to the Emergency Department and triage nurse.  Should you have questions after your visit or need to cancel or reschedule your appointment, please contact [facility]  Dept: [phone]  and follow the prompts.  Office hours are 8:00 a.m. to 4:30 p.m. Monday - Friday. Please note that voicemails left after 4:00 p.m. may not be returned until the following business day.  We are closed weekends and major holidays. You have access to a nurse at all times for urgent questions. Please call the main number to the clinic Dept: [phone] and follow the prompts.   For any non-urgent questions, you may also contact your provider using MyChart. We now offer e-Visits for anyone 9 and older to request care online for non-urgent symptoms. For details visit [email or web address].   Also download the MyChart app! Go to the app store, search "MyChart", open the app, select [facility], and log in with your MyChart username and password.  Due to Covid, a mask is required upon entering the hospital/clinic. If you do not have a mask, one will be given to you upon arrival. For doctor visits, patients may have 1 support person aged 50 or older with them. For treatment visits, patients cannot have anyone with them due to current Covid guidelines and our immunocompromised population.

## 2021-09-14 NOTE — Progress Notes (Signed)
Kristin Ward is a 66 y.o. female with a PMH multiple medical problems including type 2 DM, HTN, CHF, who presents for annual wellness visit and follow-up on chronic medical conditions.  Reports intermittent vaginal itching that feels like a yeast infection. Denies fever, chills, unexplained weight loss, dizziness, chest pain, palpitations, shortness of breath, abdominal pain, N/V/D/C.   PMH, PSH, SH and FH were reviewed and updated   ROS:  Patient denies anorexia, fever, weight changes, headaches, vision changes, decreased hearing, URI symptoms, breast concerns, chest pain, palpitations, dizziness, syncope, dyspnea on exertion, cough, swelling, nausea, vomiting, diarrhea, constipation, abdominal pain, melena, hematochezia, indigestion/heartburn, hematuria, incontinence, dysuria, vaginal bleeding, discharge, odor or itch, genital lesions, joint pains, numbness, tingling, weakness, tremor, suspicious skin lesions, depression, anxiety, abnormal bleeding/bruising, or enlarged lymph nodes.   PHYSICAL EXAM:  BP 104/78 (BP Location: Right Arm, Patient Position: Sitting)    Pulse (!) 102    Wt 223 lb (101.2 kg)    SpO2 95%    BMI 34.93 kg/m   Physical Exam Constitutional:      General: She is not in acute distress.    Appearance: Normal appearance. She is not ill-appearing.  HENT:     Head: Normocephalic and atraumatic.     Right Ear: External ear normal.     Left Ear: External ear normal.     Nose: No congestion.  Eyes:     Extraocular Movements: Extraocular movements intact.     Conjunctiva/sclera: Conjunctivae normal.     Pupils: Pupils are equal, round, and reactive to light.  Cardiovascular:     Rate and Rhythm: Normal rate and regular rhythm.     Pulses: Normal pulses.     Heart sounds: Normal heart sounds.  Pulmonary:     Effort: Pulmonary effort is normal.     Breath sounds: Normal breath sounds. No wheezing.  Abdominal:     General: Bowel sounds are normal.     Palpations:  Abdomen is soft.  Musculoskeletal:     Cervical back: Normal range of motion and neck supple.     Lumbar back: Normal. Negative right straight leg raise test and negative left straight leg raise test.     Right hip: Normal.     Left hip: Normal.  Skin:    General: Skin is warm and dry.     Findings: No bruising.  Neurological:     General: No focal deficit present.     Mental Status: She is alert and oriented to person, place, and time.  Psychiatric:        Mood and Affect: Mood normal.        Behavior: Behavior normal.        Thought Content: Thought content normal.    ASSESSMENT/PLAN:  Annual wellness exam - Stable, continue current management, return in 1 year for an annual exam  Type 2 DM - Stable, continue current management, eat a low sugar, low carbohydrate diet, patient will look for glucometer at home.   Hypertension - Stable, continue current management, eat a low salt diet  CHF - Stable, continue current management  Vaginal candidiasis - fluconazole 200 mg x 1, repeat in 7 days if needed  Hx/o breast cancer on chemotherapy - stable, last chemo treatment 09/14/2021, then will have follow up scans and an appointment  Return in 4 weeks for diabetes follow up, will wait for chemo treatment to be completed before deciding to change diabetes treatment   The patient's weight, height, BMI  have been recorded in the chart.  I have made referrals, counseling, and provided education to the patient based on review of the above and I have provided the patient with a written personalized care plan for preventive services.     Irene Pap, PA-C   09/14/2021

## 2021-09-15 ENCOUNTER — Inpatient Hospital Stay: Payer: Medicare Other

## 2021-09-15 ENCOUNTER — Other Ambulatory Visit: Payer: Self-pay

## 2021-09-15 VITALS — BP 118/77 | HR 97 | Temp 97.7°F | Resp 19 | Ht 67.0 in

## 2021-09-15 DIAGNOSIS — C50512 Malignant neoplasm of lower-outer quadrant of left female breast: Secondary | ICD-10-CM

## 2021-09-15 DIAGNOSIS — I10 Essential (primary) hypertension: Secondary | ICD-10-CM | POA: Diagnosis not present

## 2021-09-15 DIAGNOSIS — Z5189 Encounter for other specified aftercare: Secondary | ICD-10-CM | POA: Diagnosis not present

## 2021-09-15 DIAGNOSIS — Z7189 Other specified counseling: Secondary | ICD-10-CM

## 2021-09-15 DIAGNOSIS — J449 Chronic obstructive pulmonary disease, unspecified: Secondary | ICD-10-CM | POA: Diagnosis not present

## 2021-09-15 DIAGNOSIS — Z5112 Encounter for antineoplastic immunotherapy: Secondary | ICD-10-CM | POA: Diagnosis not present

## 2021-09-15 DIAGNOSIS — G620 Drug-induced polyneuropathy: Secondary | ICD-10-CM | POA: Diagnosis not present

## 2021-09-15 DIAGNOSIS — E119 Type 2 diabetes mellitus without complications: Secondary | ICD-10-CM | POA: Diagnosis not present

## 2021-09-15 DIAGNOSIS — C7951 Secondary malignant neoplasm of bone: Secondary | ICD-10-CM | POA: Diagnosis not present

## 2021-09-15 LAB — CANCER ANTIGEN 27.29: CA 27.29: 40.5 U/mL — ABNORMAL HIGH (ref 0.0–38.6)

## 2021-09-15 MED ORDER — PEGFILGRASTIM-BMEZ 6 MG/0.6ML ~~LOC~~ SOSY
6.0000 mg | PREFILLED_SYRINGE | Freq: Once | SUBCUTANEOUS | Status: AC
  Administered 2021-09-15: 6 mg via SUBCUTANEOUS
  Filled 2021-09-15: qty 0.6

## 2021-09-15 NOTE — Patient Instructions (Signed)

## 2021-09-17 LAB — GLUCOSE, CAPILLARY
Glucose-Capillary: 494 mg/dL — ABNORMAL HIGH (ref 70–99)
Glucose-Capillary: 504 mg/dL (ref 70–99)
Glucose-Capillary: 335 mg/dL — ABNORMAL HIGH (ref 70–99)

## 2021-09-18 ENCOUNTER — Other Ambulatory Visit: Payer: Self-pay | Admitting: Family Medicine

## 2021-09-19 DIAGNOSIS — H209 Unspecified iridocyclitis: Secondary | ICD-10-CM | POA: Diagnosis not present

## 2021-09-19 DIAGNOSIS — H5010 Unspecified exotropia: Secondary | ICD-10-CM | POA: Diagnosis not present

## 2021-09-19 DIAGNOSIS — H179 Unspecified corneal scar and opacity: Secondary | ICD-10-CM | POA: Diagnosis not present

## 2021-09-19 DIAGNOSIS — H3552 Pigmentary retinal dystrophy: Secondary | ICD-10-CM | POA: Diagnosis not present

## 2021-09-19 DIAGNOSIS — H401131 Primary open-angle glaucoma, bilateral, mild stage: Secondary | ICD-10-CM | POA: Diagnosis not present

## 2021-09-19 DIAGNOSIS — E119 Type 2 diabetes mellitus without complications: Secondary | ICD-10-CM | POA: Diagnosis not present

## 2021-09-19 DIAGNOSIS — H2513 Age-related nuclear cataract, bilateral: Secondary | ICD-10-CM | POA: Diagnosis not present

## 2021-09-19 LAB — HM DIABETES EYE EXAM

## 2021-09-21 ENCOUNTER — Other Ambulatory Visit: Payer: Self-pay | Admitting: Cardiology

## 2021-09-27 ENCOUNTER — Other Ambulatory Visit: Payer: Self-pay

## 2021-09-27 ENCOUNTER — Encounter: Payer: Self-pay | Admitting: Physician Assistant

## 2021-09-27 ENCOUNTER — Encounter (HOSPITAL_COMMUNITY)
Admission: RE | Admit: 2021-09-27 | Discharge: 2021-09-27 | Disposition: A | Discharge status: Home / Self Care | Payer: Medicare Other | Source: Ambulatory Visit | Attending: Hematology | Admitting: Hematology

## 2021-09-27 ENCOUNTER — Ambulatory Visit (HOSPITAL_COMMUNITY)
Admission: RE | Admit: 2021-09-27 | Discharge: 2021-09-27 | Disposition: A | Discharge status: Home / Self Care | Payer: Medicare Other | Source: Ambulatory Visit | Attending: Hematology | Admitting: Hematology

## 2021-09-27 DIAGNOSIS — C50919 Malignant neoplasm of unspecified site of unspecified female breast: Secondary | ICD-10-CM | POA: Diagnosis not present

## 2021-09-27 DIAGNOSIS — C50512 Malignant neoplasm of lower-outer quadrant of left female breast: Secondary | ICD-10-CM | POA: Insufficient documentation

## 2021-09-27 DIAGNOSIS — Z171 Estrogen receptor negative status [ER-]: Secondary | ICD-10-CM

## 2021-09-27 DIAGNOSIS — J984 Other disorders of lung: Secondary | ICD-10-CM | POA: Diagnosis not present

## 2021-09-27 DIAGNOSIS — K5710 Diverticulosis of small intestine without perforation or abscess without bleeding: Secondary | ICD-10-CM | POA: Diagnosis not present

## 2021-09-27 DIAGNOSIS — K7689 Other specified diseases of liver: Secondary | ICD-10-CM | POA: Diagnosis not present

## 2021-09-27 MED ORDER — HEPARIN SOD (PORK) LOCK FLUSH 100 UNIT/ML IV SOLN
500.0000 [IU] | Freq: Once | INTRAVENOUS | Status: AC
  Administered 2021-09-27: 500 [IU] via INTRAVENOUS

## 2021-09-27 MED ORDER — TECHNETIUM TC 99M MEDRONATE IV KIT
20.0000 | PACK | Freq: Once | INTRAVENOUS | Status: AC | PRN
  Administered 2021-09-27: 20 via INTRAVENOUS

## 2021-09-27 MED ORDER — SODIUM CHLORIDE (PF) 0.9 % IJ SOLN
INTRAMUSCULAR | Status: AC
  Filled 2021-09-27: qty 50

## 2021-09-27 MED ORDER — IOHEXOL 300 MG/ML  SOLN
100.0000 mL | Freq: Once | INTRAMUSCULAR | Status: AC | PRN
  Administered 2021-09-27: 100 mL via INTRAVENOUS

## 2021-09-27 MED FILL — Fosaprepitant Dimeglumine For IV Infusion 150 MG (Base Eq): INTRAVENOUS | Qty: 5 | Status: AC

## 2021-09-28 ENCOUNTER — Inpatient Hospital Stay: Payer: Medicare Other

## 2021-09-28 ENCOUNTER — Inpatient Hospital Stay (HOSPITAL_BASED_OUTPATIENT_CLINIC_OR_DEPARTMENT_OTHER): Payer: Medicare Other | Admitting: Hematology

## 2021-09-28 VITALS — BP 134/91 | HR 95 | Temp 98.4°F | Resp 16 | Wt 220.3 lb

## 2021-09-28 DIAGNOSIS — Z5189 Encounter for other specified aftercare: Secondary | ICD-10-CM | POA: Diagnosis not present

## 2021-09-28 DIAGNOSIS — C50512 Malignant neoplasm of lower-outer quadrant of left female breast: Secondary | ICD-10-CM | POA: Diagnosis not present

## 2021-09-28 DIAGNOSIS — C7951 Secondary malignant neoplasm of bone: Secondary | ICD-10-CM | POA: Diagnosis not present

## 2021-09-28 DIAGNOSIS — E119 Type 2 diabetes mellitus without complications: Secondary | ICD-10-CM | POA: Diagnosis not present

## 2021-09-28 DIAGNOSIS — Z171 Estrogen receptor negative status [ER-]: Secondary | ICD-10-CM | POA: Diagnosis not present

## 2021-09-28 DIAGNOSIS — Z95828 Presence of other vascular implants and grafts: Secondary | ICD-10-CM

## 2021-09-28 DIAGNOSIS — G620 Drug-induced polyneuropathy: Secondary | ICD-10-CM | POA: Diagnosis not present

## 2021-09-28 DIAGNOSIS — I10 Essential (primary) hypertension: Secondary | ICD-10-CM | POA: Diagnosis not present

## 2021-09-28 DIAGNOSIS — J449 Chronic obstructive pulmonary disease, unspecified: Secondary | ICD-10-CM | POA: Diagnosis not present

## 2021-09-28 DIAGNOSIS — Z5112 Encounter for antineoplastic immunotherapy: Secondary | ICD-10-CM | POA: Diagnosis not present

## 2021-09-28 LAB — CBC WITH DIFFERENTIAL/PLATELET
Abs Immature Granulocytes: 0.1 10*3/uL — ABNORMAL HIGH (ref 0.00–0.07)
Basophils Absolute: 0 10*3/uL (ref 0.0–0.1)
Basophils Relative: 0 %
Eosinophils Absolute: 0.2 10*3/uL (ref 0.0–0.5)
Eosinophils Relative: 3 %
HCT: 34.5 % — ABNORMAL LOW (ref 36.0–46.0)
Hemoglobin: 11.6 g/dL — ABNORMAL LOW (ref 12.0–15.0)
Immature Granulocytes: 1 %
Lymphocytes Relative: 9 %
Lymphs Abs: 0.8 10*3/uL (ref 0.7–4.0)
MCH: 31.2 pg (ref 26.0–34.0)
MCHC: 33.6 g/dL (ref 30.0–36.0)
MCV: 92.7 fL (ref 80.0–100.0)
Monocytes Absolute: 0.5 10*3/uL (ref 0.1–1.0)
Monocytes Relative: 6 %
Neutro Abs: 6.5 10*3/uL (ref 1.7–7.7)
Neutrophils Relative %: 81 %
Platelets: 226 10*3/uL (ref 150–400)
RBC: 3.72 MIL/uL — ABNORMAL LOW (ref 3.87–5.11)
RDW: 15.6 % — ABNORMAL HIGH (ref 11.5–15.5)
WBC: 8.1 10*3/uL (ref 4.0–10.5)
nRBC: 0 % (ref 0.0–0.2)

## 2021-09-28 LAB — COMPREHENSIVE METABOLIC PANEL WITH GFR
ALT: 14 U/L (ref 0–44)
AST: 13 U/L — ABNORMAL LOW (ref 15–41)
Albumin: 4.4 g/dL (ref 3.5–5.0)
Alkaline Phosphatase: 135 U/L — ABNORMAL HIGH (ref 38–126)
Anion gap: 6 (ref 5–15)
BUN: 7 mg/dL — ABNORMAL LOW (ref 8–23)
CO2: 26 mmol/L (ref 22–32)
Calcium: 9.8 mg/dL (ref 8.9–10.3)
Chloride: 104 mmol/L (ref 98–111)
Creatinine, Ser: 0.78 mg/dL (ref 0.44–1.00)
GFR, Estimated: >60 mL/min
Glucose, Bld: 447 mg/dL — ABNORMAL HIGH (ref 70–99)
Potassium: 4.1 mmol/L (ref 3.5–5.1)
Sodium: 136 mmol/L (ref 135–145)
Total Bilirubin: 0.5 mg/dL (ref 0.3–1.2)
Total Protein: 7.4 g/dL (ref 6.5–8.1)

## 2021-09-28 MED ORDER — SODIUM CHLORIDE 0.9% FLUSH
10.0000 mL | INTRAVENOUS | Status: AC | PRN
  Administered 2021-09-28: 10 mL

## 2021-09-28 NOTE — Progress Notes (Addendum)
Kristin Ward   [phone] [phone]   Clinic Follow up Note   Patient Care Team: [name] as PCP - General (Physician Assistant) [name], MD as PCP - Cardiology (Cardiology) [name], MD as Consulting Physician (Hematology) [name], MD as Consulting Physician (General Surgery)  Date of Service:  09/28/2021  CHIEF COMPLAINT: f/u of metastatic breast cancer  CURRENT THERAPY:  Kristin Ward, given on days 1 and 8 every 21 day cycle, starting 07/03/21  ASSESSMENT & PLAN:  Kristin Ward is a 66 y.o. female with   1. Left breast invasive ductal carcinoma, stage IB, p(T1cN0M0), Triple negative, Grade 3, in 2018, metastatic disease to right gluteal mass/iliac and diffuse node metastasis in 08/2020 ER 20% weakly + PR/HER2 (0) negative, PD-L1 0%, genetics (-) -diagnosed with triple negative breast cancer in 05/2017. She is s/p left breast lumpectomy, adjuvant chemo TC and Radiation.  -Genetic testing was negative for pathogenetic mutations.  -Due to new right gluteal pain, she underwent biopsy on 09/02/20 which showed metastatic carcinoma, consistent with breast primary. ER 20% positive, ER and HER2 negative.  -She started weekly Taxol on 09/26/20 for 3 weeks on/ 1 week off. Due to worsened neuropathy, Taxol stopped after C4 on 12/18/20. I switched her to Xeloda on 01/01/21 at 1534m in the AM and 20010min the PM 2 weeks on/1 week off. We increased to full dose Xeloda, 2000 mg BID, on 01/22/21. -she switched to [place] 07/03/21. She again experienced nausea as well as severe diarrhea after cycle 2, but was able to recover well. I reduced her dose from C4. -restaging CT CAP and bone scan on 09/27/21 showed improvement to lymphadenopathy and stable osseous metastatic disease. I personally reviewed the images and discussed the results with her today. -given good response, we will continue. However, given her hyperglycemia today, we will  hold treatment today. We will plan to proceed next week.   2. Diabetes and uncontrolled hyperglycemia -she is on metformin, was on insulin before but stopped  -worsening hyperglycemia lately, probably related to dexa (before chemo) and TrIvette Ward-her BG is up to 447 today, and she states she has not eaten yet today. I advised her to f/u with her PCP ASAP.   2. Right hip pain, Right iliac bone met, Vit D deficiency -She had radiating right hip pain since 2020 from right iliac wing mass -She completed Palliative radiation to right iliac 09/04/20 - 09/22/20 with Dr. MoLisbeth Renshawand her pain is much improved.  -Given localized bone met, she started Zometa q3m72monthn 11/20/20. Most recent dose 08/03/21 -vit D has remained low, most recently 20.86 on 08/17/21.   3. G2 peripheral Neuropathy  -S/p C2D8 Taxol she developed mild tingling in her hands and feet.  -She uses ice bags at home as needed.  -She continues to have moderate numbness in her hands and light tingling in her feet. She can take B12 or acupuncture.  -Taxol stopped after 12/18/20. Her neuropathy is improving.  -Overall stable   4. DM, HTN, COPD, retinitis pigmentosa with limited vision, Chronic combined systolic and diastolic heart failure, EF 45-50% -Continue to follow-up with PCP Dr LalRedmond Schoold cardiologist Dr HarEllyn Hackshe is legally blind -She recently tried insulin, but it made her sick, and she has stopped. She is on oral diabetic medicine and checking her sugars at home.    5. Goal of care discussion  -The patient understands the goal of care is palliative. -  she is full code now      Plan -CT scan reviewed, she is responding well to chemo -hold chemo today given hyperglycemia -lab, flush, and Trodelvy in 1 and 2 weeks -f/u in 2 weeks   No problem-specific Assessment & Plan notes found for this encounter.   SUMMARY OF ONCOLOGIC HISTORY: Oncology History Overview Note  Cancer Staging Malignant neoplasm of lower-outer  quadrant of left breast of female, estrogen receptor negative ([place]) Staging form: Breast, AJCC 8th Edition - Clinical stage from 06/06/2017: Stage IB (cT1c, cN0, cM0, G3, ER-, PR-, HER2-) - Signed by [name], MD on 06/15/2017 Nuclear grade: G3 Histologic grading system: 3 grade system - Pathologic stage from 06/26/2017: Stage IB (pT1c, pN0, cM0, G3, ER-, PR-, HER2-) - Signed by [name], MD on 07/10/2017 Neoadjuvant therapy: No Nuclear grade: G3 Multigene prognostic tests performed: None Histologic grading system: 3 grade system Laterality: Left     Malignant neoplasm of lower-outer quadrant of left breast of female, estrogen receptor negative ([place])  05/30/2017 Mammogram   Diagnostic mammo and US IMPRESSION: 1. Highly suspicious 1.4 cm mass in the slightly lower slightly outer left breast -tissue sampling recommended. 2. Indeterminate 0.5 mm mass in the slightly lower slightly outer left breast -tissue sampling recommended. 3. At least 2 left axillary lymph nodes with borderline cortical thickness.   06/06/2017 Initial Biopsy   Diagnosis 1. Breast, left, needle core biopsy, 5:30 o'clock - INVASIVE DUCTAL CARCINOMA, G3 2. Lymph node, needle/core biopsy, left axillary - NO CARCINOMA IDENTIFIED IN ONE LYMPH NODE (0/1)   06/06/2017 Initial Diagnosis   Malignant neoplasm of lower-outer quadrant of left breast of female, estrogen receptor negative ([place])   06/06/2017 Receptors her2   Estrogen Receptor: 0%, NEGATIVE Progesterone Receptor: 0%, NEGATIVE Proliferation Marker Ki67: 70%   06/26/2017 Surgery   LEFT BREAST LUMPECTOMY WITH RADIOACTIVE SEED AND SENTINEL LYMPH NODE BIOPSY ERAS  PATHWAY AND INSERTION PORT-A-CATH By Dr. Barry Dienes on 06/26/17    06/26/2017 Pathology Results   Diagnosis 06/26/17  1. Breast, lumpectomy, Left - INVASIVE DUCTAL CARCINOMA, GRADE III/III, SPANNING 1.2 CM. - THE SURGICAL RESECTION MARGINS ARE NEGATIVE FOR CARCINOMA. - SEE ONCOLOGY TABLE  BELOW. 2. Lymph node, sentinel, biopsy, Left axillary #1 - THERE IS NO EVIDENCE OF CARCINOMA IN 1 OF 1 LYMPH NODE (0/1). 3. Lymph node, sentinel, biopsy, Left axillary #2 - THERE IS NO EVIDENCE OF CARCINOMA IN 1 OF 1 LYMPH NODE (0/1). 4. Lymph node, sentinel, biopsy, Left axillary #3 - THERE IS NO EVIDENCE OF CARCINOMA IN 1 OF 1 LYMPH NODE (0/1).    07/25/2017 - 09/29/2017 Chemotherapy   Adjuvant cytoxan and docetaxel (TC) every 3 weekd for 4 cycles     11/19/2017 - 12/17/2017 Radiation Therapy   Adjuvant breast radiation Left breast treated to 42.5 Gy with 17 fx of 2.5 Gy followed by a boost of 7.5 Gy with 3 fx of 2.5 Gy      02/2019 Procedure   She had PAC removal in 02/2019.    07/23/2020 Imaging   MRI Lumbar Spine 07/23/20 IMPRESSION: 1. No significant disc herniation, spinal canal or neural foraminal stenosis at any level. 2. Moderate facet degenerative changes at L3-4, L4-5 and L5-S1. 3. Multiple enlarged retroperitoneal and right iliac lymph nodes. Recommend correlation with CT of the abdomen and pelvis with contrast.   08/15/2020 Imaging   CT AP 08/15/20  IMPRESSION: 1. Right iliac and periaortic adenopathy is noted concerning for metastatic disease or lymphoma. Also noted is abnormal soft tissue  mass anterior and posterior to the right iliac wing concerning for malignancy or metastatic disease. MRI is recommended for further evaluation. 2. Irregular lucency with sclerotic margins is seen involving the right iliac crest concerning for possible lytic lesion. MRI may be performed for further evaluation. 3. Enlarged fibroid uterus. 4. Small gallstone. 5. Aortic atherosclerosis.   08/22/2020 Pathology Results   FINAL MICROSCOPIC DIAGNOSIS: 08/22/20  A. NEEDLE CORE, RIGHT GLUTEUS MINIMUMUS, BIOPSY:  -  Metastatic carcinoma  -  See comment   COMMENT:  By immunohistochemistry, the neoplastic cells are positive for  cytokeratin-7 and have weak patchy positivity for GATA3  but are negative for cytokeratin-20 and GCDFP.  The combined morphology and immunophenotype, support metastasis from the patient's known breast primary carcinoma.   ADDENDUM:  PROGNOSTIC INDICATOR RESULTS:  The tumor cells are NEGATIVE for Her2 (0).  Estrogen Receptor:       POSITIVE, 20%, WEAK STAINING  Progesterone Receptor:   NEGATIVE   PD-L1 (0) negative    09/04/2020 - 09/22/2020 Radiation Therapy   Palliative radiation to right iliac 09/04/20 - 09/22/20, Dr. Lisbeth Renshaw   09/26/2020 -  Chemotherapy   first line weekly Taxol 3 weeks on/1 week off starting 09/26/20 --Reduced to 2 weeks on/1 week off starting with C3 due to neuropathy. Stopped after C4 on 12/18/20 due to neuratrophy.  --Switched to Xeloda on 01/01/21 at 1566m in the AM and 20090min the PM for 2 weeks on/1 week off     10/13/2020 Procedure    PAC placement on 10/13/20.    04/10/2021 Imaging   CT CAP  IMPRESSION: 1. Enlarging abdominopelvic retroperitoneal and right inguinal adenopathy, compatible with disease progression. 2. Lytic metastasis in the right iliac wing, similar. 3. Hepatomegaly. 4. Cholelithiasis. 5. Enlarged fibroid uterus. 6.  Aortic atherosclerosis (ICD10-I70.0).   06/18/2021 Imaging   CT CAP  IMPRESSION: 1. Today's study demonstrates progression of metastatic disease as evidence by increased number and size of numerous enlarged lymph nodes in the retroperitoneum, along the right pelvic sidewall, and in the upper right thigh. 2. Osseous metastases in the bony pelvis appear similar to the prior examination. No definite new osseous lesions are otherwise noted. 3. Fibroid uterus again noted. 4. Cholelithiasis without evidence of acute cholecystitis. 5. Aortic atherosclerosis, in addition to left anterior descending coronary artery disease. Please note that although the presence of coronary artery calcium documents the presence of coronary artery disease, the severity of this disease and any potential  stenosis cannot be assessed on this non-gated CT examination. Assessment for potential risk factor modification, dietary therapy or pharmacologic therapy may be warranted, if clinically indicated. 6. Additional incidental findings, as above.   06/20/2021 Imaging   Bone Scan  IMPRESSION: No osseous uptake suspicious for recurrent/progressive metastatic disease.   07/03/2021 -  Chemotherapy   Patient is on Treatment Plan : BREAST METASTATIC Sacituzumab [id number]-hziy (TIvette Loyalq21d        INTERVAL HISTORY:  Kristin Ward is here for a follow up of metastatic breast cancer. She was last seen by me on 09/07/21. She presents to the clinic alone. She had someone on speaker phone. She reports she is stable overall. She reports she continues to have problems from the infusion, including nausea.   All other systems were reviewed with the patient and are negative.  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 Diagnosed in [facility]   Headache    NONE RECENT   Hypertension    Lymph nodes enlarged 07/25/2020   Malignant neoplasm of lower-outer quadrant of left breast of female, estrogen receptor negative ([place]) 06/11/2017   left breast   Mixed hyperlipidemia 06/17/2018   partially blind    Personal history of chemotherapy 2018-2019   Personal history of radiation therapy 2019   Retinitis pigmentosa    Retroperitoneal lymphadenopathy 07/25/2020   Uveitis     SURGICAL HISTORY: Past Surgical History:  Procedure Laterality Date   brain cyst removed  2007   to help with headaches per patient , aspirated    BREAST BIOPSY  Left 06/06/2017   x2   BREAST CYST ASPIRATION Left 06/06/2017   BREAST LUMPECTOMY Left 06/26/2017   BREAST LUMPECTOMY WITH RADIOACTIVE SEED AND SENTINEL LYMPH NODE BIOPSY Left 06/26/2017   Procedure: BREAST LUMPECTOMY WITH RADIOACTIVE SEED AND SENTINEL LYMPH NODE BIOPSY ERAS  PATHWAY;  Surgeon: [name], MD;  Location: [facility];  Service: General;  Laterality: Left;  pec block   FINE NEEDLE ASPIRATION Left 02/23/2019   Procedure: FINE NEEDLE ASPIRATION;  Surgeon: [name], MD;  Location: [place];  Service: General;  Laterality: Left;  Asiration of left breast seroma    IR IMAGING GUIDED PORT INSERTION  10/13/2020   NASAL TURBINATE REDUCTION     PORT-A-CATH REMOVAL Right 02/23/2019   Procedure: REMOVAL PORT-A-CATH;  Surgeon: [name], MD;  Location: [facility];  Service: General;  Laterality: Right;   PORTACATH PLACEMENT N/A 06/26/2017   Procedure: INSERTION PORT-A-CATH;  Surgeon: [name], MD;  Location: [place];  Service: General;  Laterality: N/A;   TRANSTHORACIC ECHOCARDIOGRAM  12/2015   A) [facility] May 2017: Mild concentric LVH. Global hypokinesis. GR daily. EF 18% severe LA dilation. Mitral annular dilatation with papillary muscle dysfunction and moderate MR. Dilated IVC consistent with elevated RAP.   TRANSTHORACIC ECHOCARDIOGRAM  05/2017; 08/2017   a) Mild concentric LVH.  EF 45 to 50% with diffuse hypokinesis.  GR 1 DD.  Mild aortic root dilation.;; b)  Mild LVH EF 45 to 50%.  Diffuse HK.  No significant valve disease.  No significant change.    I have reviewed the social history and family history with the patient and they are unchanged from previous note.  ALLERGIES:  is allergic to morphine and related, latex, tylenol with codeine #3 [acetaminophen-codeine], and penicillins.  MEDICATIONS:  Current Outpatient Medications  Medication Sig Dispense Refill   Accu-Chek FastClix Lancets MISC TEST TWICE A DAY. PT USES AN ACCU-CHEK  GUIDE ME METER 102 each 2   aspirin 81 MG chewable tablet Chew 81 mg by mouth daily.     atorvastatin (LIPITOR) 20 MG tablet Take 1 tablet (20 mg total) by mouth daily. 90 tablet 3   Blood Glucose Calibration (ACCU-CHEK GUIDE CONTROL VI) In Vitro     carvedilol (COREG) 12.5 MG tablet Take 1 tablet (12.5 mg total) by mouth 2 (two) times daily with a meal. 180 tablet 1   Cholecalciferol (D3-1000 PO) Take by mouth.     fluconazole (DIFLUCAN) 100 MG tablet Take 2 tablets (200 mg total) by mouth daily. Take 2 tablets by mouth now, may  repeat in 7 days if needed 4 tablet 1   furosemide (LASIX) 20 MG tablet TAKE 2 TABLETS (40 MG TOTAL) BY MOUTH AS NEEDED. FOR WEIGHT GAIN 3 TO 4 LBS. 90 tablet 3   glucose blood (ACCU-CHEK GUIDE) test strip Use as instructed to test blood sugar 2 times a day 100 strip 2   ibuprofen (ADVIL) 800 MG tablet Take 1 tablet (800 mg total) by mouth every 8 (eight) hours as needed. 30 tablet 2   levofloxacin (LEVAQUIN) 500 MG tablet Take 500 mg by mouth daily.     lidocaine-prilocaine (EMLA) cream Apply 1 application topically as needed. 30 g 0   linaclotide (LINZESS) 72 MCG capsule Take 1 capsule (72 mcg total) by mouth daily before breakfast. MUST HAVE APPOINTMENT FOR REFILLS 30 capsule 1   metFORMIN (GLUCOPHAGE) 500 MG tablet TAKE 1 TABLET BY MOUTH 2 TIMES DAILY WITH A MEAL. 180 tablet 1   morphine (MS CONTIN) 15 MG 12 hr tablet Oral     Multiple Vitamins-Minerals (WOMENS MULTI PO) Take by mouth.     ondansetron (ZOFRAN) 8 MG tablet Take 1 tablet (8 mg total) by mouth every 8 (eight) hours as needed for nausea or vomiting (begin on day 3 after chemo). 30 tablet 1   oxyCODONE (OXY IR/ROXICODONE) 5 MG immediate release tablet Oral     oxyCODONE (OXYCONTIN) 10 mg 12 hr tablet Oral     oxyCODONE-acetaminophen (PERCOCET/ROXICET) 5-325 MG tablet Oral     pantoprazole (PROTONIX) 40 MG tablet Take 1 tablet (40 mg total) by mouth daily. 30 tablet 0   prochlorperazine (COMPAZINE) 10 MG  tablet Take 1 tablet (10 mg total) by mouth every 6 (six) hours as needed (Nausea or vomiting). (Patient not taking: Reported on 09/13/2021) 30 tablet 1   RYBELSUS 3 MG TABS TAKE 1 TABLET BY MOUTH DAILY WITH BREAKFAST. 30 tablet 3   sacubitril-valsartan (ENTRESTO) 97-103 MG Take 1 tablet by mouth 2 (two) times daily. 180 tablet 3   spironolactone (ALDACTONE) 25 MG tablet Take 1 tablet (25 mg total) by mouth daily. 90 tablet 3   vitamin B-12 (CYANOCOBALAMIN) 500 MCG tablet Take 500 mcg by mouth daily.     vitamin C (ASCORBIC ACID) 500 MG tablet Take 1,000 mg 2 (two) times daily by mouth.     No current facility-administered medications for this visit.    PHYSICAL EXAMINATION: ECOG PERFORMANCE STATUS: 1 - Symptomatic but completely ambulatory  There were no vitals filed for this visit. Wt Readings from Last 3 Encounters:  09/14/21 225 lb (102.1 kg)  09/13/21 223 lb (101.2 kg)  09/07/21 226 lb 8 oz (102.7 kg)     GENERAL:alert, no distress and comfortable SKIN: skin color normal, no rashes or significant lesions EYES: normal, Conjunctiva are pink and non-injected, sclera clear  NEURO: alert & oriented x 3 with fluent speech  LABORATORY DATA:  I have reviewed the data as listed CBC Latest Ref Rng & Units 09/28/2021 09/14/2021 09/07/2021  WBC 4.0 - 10.5 K/uL 8.1 2.7(L) 7.9  Hemoglobin 12.0 - 15.0 g/dL 11.6(L) 10.3(L) 11.1(L)  Hematocrit 36.0 - 46.0 % 34.5(L) 31.0(L) 33.8(L)  Platelets 150 - 400 K/uL 226 318 235     CMP Latest Ref Rng & Units 09/28/2021 09/14/2021 09/07/2021  Glucose 70 - 99 mg/dL 447(H) 361(H) 332(H)  BUN 8 - 23 mg/dL 7(L) 13 12  Creatinine 0.44 - 1.00 mg/dL 0.78 0.81 0.76  Sodium 135 - 145 mmol/L 136 138 137  Potassium 3.5 - 5.1  mmol/L 4.1 3.7 4.3  Chloride 98 - 111 mmol/L 104 105 104  CO2 22 - 32 mmol/L '26 25 27  ' Calcium 8.9 - 10.3 mg/dL 9.8 9.1 9.7  Total Protein 6.5 - 8.1 g/dL 7.4 7.3 7.2  Total Bilirubin 0.3 - 1.2 mg/dL 0.5 0.5 0.5  Alkaline Phos 38 - 126 U/L  135(H) 100 123  AST 15 - 41 U/L 13(L) 13(L) 14(L)  ALT 0 - 44 U/L '14 14 17      ' RADIOGRAPHIC STUDIES: I have personally reviewed the radiological images as listed and agreed with the findings in the report. NM Bone Scan Whole Body  Result Date: 09/28/2021 CLINICAL DATA:  Stage IV breast cancer.  Assess treatment response. EXAM: NUCLEAR MEDICINE WHOLE BODY BONE SCAN TECHNIQUE: Whole body anterior and posterior images were obtained approximately 3 hours after intravenous injection of radiopharmaceutical. RADIOPHARMACEUTICALS:  18.1 mCi Technetium-58mMDP IV COMPARISON:  CTs of the chest, abdomen and pelvis same date. Bone scan 06/20/2021. FINDINGS: The osseous uptake is stable and within normal limits. There is no increased uptake within the sclerotic lesion in the right iliac bone. Scattered arthropathic activity appears unchanged. The soft tissue activity appears normal. IMPRESSION: Stable examination, without abnormal osseous uptake. Electronically Signed   By: WRichardean SaleM.D.   On: 09/28/2021 08:22   CT CHEST ABDOMEN PELVIS W CONTRAST  Addendum Date: 09/28/2021   ADDENDUM REPORT: 09/28/2021 08:23 ADDENDUM: Not mentioned in the original impression is a possible nonocclusive fibrin sheath along the distal aspect of the Port-A-Cath. Electronically Signed   By: WRichardean SaleM.D.   On: 09/28/2021 08:23   Result Date: 09/28/2021 CLINICAL DATA:  Stage IV breast cancer. Chemotherapy completed 2 weeks ago. Assess treatment response. EXAM: CT CHEST, ABDOMEN, AND PELVIS WITH CONTRAST TECHNIQUE: Multidetector CT imaging of the chest, abdomen and pelvis was performed following the standard protocol during bolus administration of intravenous contrast. RADIATION DOSE REDUCTION: This exam was performed according to the departmental dose-optimization program which includes automated exposure control, adjustment of the mA and/or kV according to patient size and/or use of iterative reconstruction technique.  CONTRAST:  1026mOMNIPAQUE IOHEXOL 300 MG/ML  SOLN COMPARISON:  Prior CTs 06/18/2021 and 04/10/2021. Whole-body bone scan today and 06/20/2021. FINDINGS: CT CHEST FINDINGS Cardiovascular: Right IJ Port-A-Cath extends to the superior cavoatrial junction. There is possible non occlusive fibrin sheath along the distal aspect of the catheter (image 26/2). Mild atherosclerosis of the aorta, great vessels and coronary arteries. The heart size is normal. There is no pericardial effusion. Mediastinum/Nodes: There are no enlarged mediastinal, hilar, axillary or internal mammary lymph nodes. Postsurgical changes are present within the left axilla. Small hiatal hernia. The thyroid gland and trachea appear unremarkable. Lungs/Pleura: No pleural effusion or pneumothorax. The lungs appear stable with scattered mild subpleural scarring, asymmetric anteriorly in the left upper lobe attributed to prior radiation therapy. No suspicious pulmonary nodules. Musculoskeletal/Chest wall: No chest wall mass or suspicious osseous findings. A postsurgical fluid collection within the left breast measures 4.2 cm, similar to previous study. CT ABDOMEN AND PELVIS FINDINGS Hepatobiliary: The liver is normal in density without suspicious focal abnormality. Small calcified gallstones are again noted. No gallbladder wall thickening or biliary dilatation. Pancreas: Unremarkable. No pancreatic ductal dilatation or surrounding inflammatory changes. Spleen: Normal in size without focal abnormality. Adrenals/Urinary Tract: Both adrenal glands appear normal. Stable renal cortical lobularity bilaterally. No evidence of urinary tract calculus or hydronephrosis. The bladder appears unremarkable for its degree of distention. Stomach/Bowel: Enteric contrast was  administered and has passed into the mid colon. The stomach appears unremarkable for its degree of distension. No evidence of bowel wall thickening, distention or surrounding inflammatory change. Stable  duodenal diverticulum. The appendix appears normal. There is moderate stool throughout the colon. Vascular/Lymphatic: The previously demonstrated extensive retroperitoneal and pelvic adenopathy has significantly improved, and there are no residual enlarged lymph nodes within the abdomen or pelvis. Inguinal adenopathy has improved with the largest remaining node measuring 1.1 cm on image 127/2 (previously 2.2 cm). No progressive adenopathy. Aortic and branch vessel atherosclerosis without acute vascular findings. Reproductive: Fibroid uterus, similar to previous study. No adnexal mass. Other: Intact abdominal wall.  No ascites or peritoneal nodularity. Musculoskeletal: Stable irregular sclerosis and mild expansion of the right iliac wing. Small sclerotic lesion in the left iliac bone is unchanged. No new osseous metastases or pathologic fractures identified. IMPRESSION: 1. Marked interval improvement in previously demonstrated lymphadenopathy in the abdomen and pelvis. A few prominent right inguinal lymph nodes remain. 2. Stable osseous metastatic disease in the pelvis. 3. No evidence of local recurrence or other metastatic disease. 4. Stable incidental findings including cholelithiasis, uterine fibroids and Aortic Atherosclerosis (ICD10-I70.0). Electronically Signed: By: Richardean Sale M.D. On: 09/28/2021 08:17      No orders of the defined types were placed in this encounter.  All questions were answered. The patient knows to call the clinic with any problems, questions or concerns. No barriers to learning was detected. The total time spent in the appointment was 30 minutes.     [name], MD 09/28/2021   I, Wilburn Mylar, am acting as scribe for [name], MD.   I have reviewed the above documentation for accuracy and completeness, and I agree with the above.

## 2021-09-29 ENCOUNTER — Encounter: Payer: Self-pay | Admitting: Hematology

## 2021-09-29 LAB — CANCER ANTIGEN 27.29: CA 27.29: 25.6 U/mL (ref 0.0–38.6)

## 2021-10-04 ENCOUNTER — Other Ambulatory Visit: Payer: Self-pay | Admitting: Hematology

## 2021-10-05 ENCOUNTER — Inpatient Hospital Stay: Payer: Medicare Other

## 2021-10-06 ENCOUNTER — Ambulatory Visit: Payer: Medicare Other

## 2021-10-10 MED FILL — Fosaprepitant Dimeglumine For IV Infusion 150 MG (Base Eq): INTRAVENOUS | Qty: 5 | Status: AC

## 2021-10-11 ENCOUNTER — Other Ambulatory Visit: Payer: Self-pay

## 2021-10-11 ENCOUNTER — Inpatient Hospital Stay: Payer: Medicare Other | Admitting: Hematology

## 2021-10-11 ENCOUNTER — Telehealth: Payer: Self-pay

## 2021-10-11 ENCOUNTER — Inpatient Hospital Stay: Payer: Medicare Other

## 2021-10-11 ENCOUNTER — Inpatient Hospital Stay: Payer: Medicare Other | Attending: Hematology

## 2021-10-11 VITALS — BP 137/89 | HR 73 | Temp 98.2°F | Resp 18 | Wt 217.0 lb

## 2021-10-11 DIAGNOSIS — Z794 Long term (current) use of insulin: Secondary | ICD-10-CM | POA: Insufficient documentation

## 2021-10-11 DIAGNOSIS — Z170 Estrogen receptor positive status [ER+]: Secondary | ICD-10-CM | POA: Insufficient documentation

## 2021-10-11 DIAGNOSIS — Z5189 Encounter for other specified aftercare: Secondary | ICD-10-CM | POA: Insufficient documentation

## 2021-10-11 DIAGNOSIS — C7951 Secondary malignant neoplasm of bone: Secondary | ICD-10-CM | POA: Diagnosis not present

## 2021-10-11 DIAGNOSIS — E1165 Type 2 diabetes mellitus with hyperglycemia: Secondary | ICD-10-CM | POA: Insufficient documentation

## 2021-10-11 DIAGNOSIS — E559 Vitamin D deficiency, unspecified: Secondary | ICD-10-CM | POA: Diagnosis not present

## 2021-10-11 DIAGNOSIS — Z171 Estrogen receptor negative status [ER-]: Secondary | ICD-10-CM

## 2021-10-11 DIAGNOSIS — Z5112 Encounter for antineoplastic immunotherapy: Secondary | ICD-10-CM | POA: Insufficient documentation

## 2021-10-11 DIAGNOSIS — G620 Drug-induced polyneuropathy: Secondary | ICD-10-CM | POA: Diagnosis not present

## 2021-10-11 DIAGNOSIS — Z95828 Presence of other vascular implants and grafts: Secondary | ICD-10-CM

## 2021-10-11 DIAGNOSIS — I10 Essential (primary) hypertension: Secondary | ICD-10-CM | POA: Insufficient documentation

## 2021-10-11 DIAGNOSIS — J449 Chronic obstructive pulmonary disease, unspecified: Secondary | ICD-10-CM | POA: Diagnosis not present

## 2021-10-11 DIAGNOSIS — C50512 Malignant neoplasm of lower-outer quadrant of left female breast: Secondary | ICD-10-CM | POA: Insufficient documentation

## 2021-10-11 LAB — COMPREHENSIVE METABOLIC PANEL
ALT: 10 U/L (ref 0–44)
AST: 13 U/L — ABNORMAL LOW (ref 15–41)
Albumin: 4.1 g/dL (ref 3.5–5.0)
Alkaline Phosphatase: 89 U/L (ref 38–126)
Anion gap: 6 (ref 5–15)
BUN: 9 mg/dL (ref 8–23)
CO2: 26 mmol/L (ref 22–32)
Calcium: 10 mg/dL (ref 8.9–10.3)
Chloride: 102 mmol/L (ref 98–111)
Creatinine, Ser: 0.79 mg/dL (ref 0.44–1.00)
GFR, Estimated: >60 mL/min (ref >60)
Glucose, Bld: 529 mg/dL (ref 70–99)
Potassium: 4.2 mmol/L (ref 3.5–5.1)
Sodium: 134 mmol/L — ABNORMAL LOW (ref 135–145)
Total Bilirubin: 0.5 mg/dL (ref 0.3–1.2)
Total Protein: 7.2 g/dL (ref 6.5–8.1)

## 2021-10-11 LAB — CBC WITH DIFFERENTIAL/PLATELET
Abs Immature Granulocytes: 0.01 10*3/uL (ref 0.00–0.07)
Basophils Absolute: 0 10*3/uL (ref 0.0–0.1)
Basophils Relative: 0 %
Eosinophils Absolute: 0.2 10*3/uL (ref 0.0–0.5)
Eosinophils Relative: 4 %
HCT: 32.8 % — ABNORMAL LOW (ref 36.0–46.0)
Hemoglobin: 10.9 g/dL — ABNORMAL LOW (ref 12.0–15.0)
Immature Granulocytes: 0 %
Lymphocytes Relative: 19 %
Lymphs Abs: 0.9 10*3/uL (ref 0.7–4.0)
MCH: 31 pg (ref 26.0–34.0)
MCHC: 33.2 g/dL (ref 30.0–36.0)
MCV: 93.2 fL (ref 80.0–100.0)
Monocytes Absolute: 0.4 10*3/uL (ref 0.1–1.0)
Monocytes Relative: 9 %
Neutro Abs: 3.3 10*3/uL (ref 1.7–7.7)
Neutrophils Relative %: 68 %
Platelets: 278 10*3/uL (ref 150–400)
RBC: 3.52 MIL/uL — ABNORMAL LOW (ref 3.87–5.11)
RDW: 14.3 % (ref 11.5–15.5)
WBC: 4.9 10*3/uL (ref 4.0–10.5)
nRBC: 0 % (ref 0.0–0.2)

## 2021-10-11 MED ORDER — SODIUM CHLORIDE 0.9% FLUSH
10.0000 mL | INTRAVENOUS | Status: DC | PRN
  Administered 2021-10-11: 10 mL via INTRAVENOUS

## 2021-10-11 MED ORDER — HEPARIN SOD (PORK) LOCK FLUSH 100 UNIT/ML IV SOLN
500.0000 [IU] | Freq: Once | INTRAVENOUS | Status: AC | PRN
  Administered 2021-10-11: 500 [IU] via INTRAVENOUS

## 2021-10-11 NOTE — Progress Notes (Signed)
Dr Burr Medico made aware of blood glucose 529. Patient states she is seeing her PCP tomorrow for blood glucose follow up. Hold treatment this week per Dr Burr Medico. ?No other orders given. ?

## 2021-10-11 NOTE — Progress Notes (Signed)
Acute Office Visit  Subjective:    Patient ID: Kristin Ward, female    DOB: Jul 23, 1956, 66 y.o.   MRN: [id number]  Chief Complaint  Patient presents with   Follow-up    Diabetes Follow up. Still having having high blood sugar readings from Chemo meds. Had last another yeast infection last week and is on Diflucan. She said its cleared up but sometimes her hands itch    HPI  Patient is in today for a follow up exam.   Reports that her blood sugars have been too high for her chemo treatments; was able to get chemo 10/05/21 and states they gave her insulin; yesterday states she started chemo and then it was stopped because her blood sugar was too high and she was advised to follow up with her PCP.   Fluconazole helps vaginal itching as well as itchy hands.   In office fasting blood glucose 410 and 383 after patient received (sample Apidra lot [id number], exp 04/11/22, 1, 3 ml prefilled pen) 6 units;hgb a1c 12.    Past Medical History:  Diagnosis Date   Abnormal x-ray of lungs with single pulmonary nodule 03/15/2016   per records from [place]    Anemia    Asthma    Cholelithiasis 05/19/2017   On CT   Coronary artery calcification seen on CAT scan 05/19/2017   Diabetes mellitus without complication ([place])    type 2   Diabetes mellitus, new onset ([place]) 01/20/2019   Dilated idiopathic cardiomyopathy ([employer]) 12/2015   EF 15-20%. Diagnosed in [facility]   Headache    NONE RECENT   Hypertension    Lymph nodes enlarged 07/25/2020   Malignant neoplasm of lower-outer quadrant of left breast of female, estrogen receptor negative ([place]) 06/11/2017   left breast   Mixed hyperlipidemia 06/17/2018   partially blind    Personal history of chemotherapy 2018-2019   Personal history of radiation therapy 2019   Retinitis pigmentosa    Retroperitoneal lymphadenopathy 07/25/2020   Uveitis     Past Surgical History:  Procedure Laterality Date   brain cyst removed  2007    to help with headaches per patient , aspirated    BREAST BIOPSY Left 06/06/2017   x2   BREAST CYST ASPIRATION Left 06/06/2017   BREAST LUMPECTOMY Left 06/26/2017   BREAST LUMPECTOMY WITH RADIOACTIVE SEED AND SENTINEL LYMPH NODE BIOPSY Left 06/26/2017   Procedure: BREAST LUMPECTOMY WITH RADIOACTIVE SEED AND SENTINEL LYMPH NODE BIOPSY ERAS  PATHWAY;  Surgeon: [name], MD;  Location: [place];  Service: General;  Laterality: Left;  pec block   FINE NEEDLE ASPIRATION Left 02/23/2019   Procedure: FINE NEEDLE ASPIRATION;  Surgeon: [name], MD;  Location: [place];  Service: General;  Laterality: Left;  Asiration of left breast seroma    IR IMAGING GUIDED PORT INSERTION  10/13/2020   NASAL TURBINATE REDUCTION     PORT-A-CATH REMOVAL Right 02/23/2019   Procedure: REMOVAL PORT-A-CATH;  Surgeon: [name], MD;  Location: [place];  Service: General;  Laterality: Right;   PORTACATH PLACEMENT N/A 06/26/2017   Procedure: INSERTION PORT-A-CATH;  Surgeon: [name], MD;  Location: [place];  Service: General;  Laterality: N/A;   TRANSTHORACIC ECHOCARDIOGRAM  12/2015   A) [facility] May 2017: Mild concentric LVH. Global hypokinesis. GR daily. EF 18% severe LA dilation. Mitral annular dilatation with papillary muscle dysfunction and moderate MR. Dilated IVC consistent with elevated RAP.   TRANSTHORACIC ECHOCARDIOGRAM  05/2017;  08/2017   a) Mild concentric LVH.  EF 45 to 50% with diffuse hypokinesis.  GR 1 DD.  Mild aortic root dilation.;; b)  Mild LVH EF 45 to 50%.  Diffuse HK.  No significant valve disease.  No significant change.    Family History  Problem Relation Age of Onset   Colon cancer Mother 31       deceased 72   Heart attack Father    Breast cancer Other 57       2nd cousin on maternal side; currently 13   Pancreatic cancer Other        2nd cousin once removed on maternal side; deceased 1s   Fibromyalgia Sister    Esophageal cancer Neg  Hx    Rectal cancer Neg Hx    Stomach cancer Neg Hx     Social History   Socioeconomic History   Marital status: Widowed    Spouse name: Not on file   Number of children: 2   Years of education: Not on file   Highest education level: Not on file  Occupational History   Not on file  Tobacco Use   Smoking status: Former    Packs/day: 0.25    Years: 23.00    Pack years: 5.75    Types: Cigarettes    Quit date: 10/09/2015    Years since quitting: 6.0   Smokeless tobacco: Never  Vaping Use   Vaping Use: Never used  Substance and Sexual Activity   Alcohol use: No   Drug use: No   Sexual activity: Never  Other Topics Concern   Not on file  Social History Narrative   She is a widowed mother of 2 (daughter and son).   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   She moved to [place] apparently back in 2016, but was visiting family in [place] and was diagnosed with Cardiomyopathy-EF 18%.   She is usually accompanied by her daughter.    At baseline, she walks roughly 1-2 miles a day.   Social Determinants of Health   Financial Resource Strain: Not on file  Food Insecurity: Not on file  Transportation Needs: Not on file  Physical Activity: Not on file  Stress: Not on file  Social Connections: Not on file  Intimate Partner Violence: Not on file    Outpatient Medications Prior to Visit  Medication Sig Dispense Refill   Accu-Chek FastClix Lancets MISC TEST TWICE A DAY. PT USES AN ACCU-CHEK GUIDE ME METER 102 each 2   atorvastatin (LIPITOR) 20 MG tablet Take 1 tablet (20 mg total) by mouth daily. 90 tablet 3   Blood Glucose Calibration (ACCU-CHEK GUIDE CONTROL VI) In Vitro     carvedilol (COREG) 12.5 MG tablet Take 1 tablet (12.5 mg total) by mouth 2 (two) times daily with a meal. 180 tablet 1   Cholecalciferol (D3-1000 PO) Take by mouth.     furosemide (LASIX) 20 MG tablet TAKE 2 TABLETS (40 MG TOTAL) BY MOUTH AS NEEDED. FOR WEIGHT GAIN 3 TO 4 LBS. 90 tablet 3   glucose  blood (ACCU-CHEK GUIDE) test strip Use as instructed to test blood sugar 2 times a day 100 strip 2   ibuprofen (ADVIL) 800 MG tablet Take 1 tablet (800 mg total) by mouth every 8 (eight) hours as needed. 30 tablet 2   levofloxacin (LEVAQUIN) 500 MG tablet Take 500 mg by mouth daily.     lidocaine-prilocaine (EMLA) cream Apply 1 application topically as needed. [place]  g 0   linaclotide (LINZESS) 72 MCG capsule Take 1 capsule (72 mcg total) by mouth daily before breakfast. MUST HAVE APPOINTMENT FOR REFILLS 30 capsule 1   Multiple Vitamins-Minerals (WOMENS MULTI PO) Take by mouth.     ondansetron (ZOFRAN) 8 MG tablet Take 1 tablet (8 mg total) by mouth every 8 (eight) hours as needed for nausea or vomiting (begin on day 3 after chemo). 30 tablet 1   oxyCODONE (OXY IR/ROXICODONE) 5 MG immediate release tablet Oral     oxyCODONE (OXYCONTIN) 10 mg 12 hr tablet Oral     oxyCODONE-acetaminophen (PERCOCET/ROXICET) 5-325 MG tablet Oral     pantoprazole (PROTONIX) 40 MG tablet TAKE 1 TABLET BY MOUTH EVERY DAY 30 tablet 0   sacubitril-valsartan (ENTRESTO) 97-103 MG Take 1 tablet by mouth 2 (two) times daily. 180 tablet 3   vitamin B-12 (CYANOCOBALAMIN) 500 MCG tablet Take 500 mcg by mouth daily.     vitamin C (ASCORBIC ACID) 500 MG tablet Take 1,000 mg 2 (two) times daily by mouth.     fluconazole (DIFLUCAN) 100 MG tablet Take 2 tablets (200 mg total) by mouth daily. Take 2 tablets by mouth now, may repeat in 7 days if needed 4 tablet 1   metFORMIN (GLUCOPHAGE) 500 MG tablet TAKE 1 TABLET BY MOUTH 2 TIMES DAILY WITH A MEAL. 180 tablet 1   RYBELSUS 3 MG TABS TAKE 1 TABLET BY MOUTH DAILY WITH BREAKFAST. 30 tablet 3   aspirin 81 MG chewable tablet Chew 81 mg by mouth daily. (Patient not taking: Reported on 10/12/2021)     morphine (MS CONTIN) 15 MG 12 hr tablet Oral (Patient not taking: Reported on 10/12/2021)     prochlorperazine (COMPAZINE) 10 MG tablet Take 1 tablet (10 mg total) by mouth every 6 (six) hours as  needed (Nausea or vomiting). (Patient not taking: Reported on 09/13/2021) 30 tablet 1   spironolactone (ALDACTONE) 25 MG tablet Take 1 tablet (25 mg total) by mouth daily. (Patient not taking: Reported on 10/12/2021) 90 tablet 3   No facility-administered medications prior to visit.    Allergies  Allergen Reactions   Morphine And Related Nausea And Vomiting   Latex Hives and Itching    Burning    Tylenol With Codeine #3 [Acetaminophen-Codeine] Nausea And Vomiting   Penicillins Nausea Only, Swelling and Rash    Has patient had a PCN reaction causing immediate rash, facial/tongue/throat swelling, SOB or lightheadedness with hypotension: Yes Has patient had a PCN reaction causing severe rash involving mucus membranes or skin necrosis: Yes Has patient had a PCN reaction that required hospitalization: No Has patient had a PCN reaction occurring within the last 10 years: No TONGUE SWELLING AND RASH AROUND MOUTH If all of the above answers are "NO", then may proceed with Cephalosporin use.     Review of Systems  Constitutional:  Negative for activity change and chills.  HENT:  Negative for congestion and voice change.   Eyes:  Negative for pain and redness.  Respiratory:  Negative for cough and wheezing.   Cardiovascular:  Negative for chest pain.  Gastrointestinal:  Negative for constipation, diarrhea, nausea and vomiting.  Endocrine: Negative for polyuria.  Genitourinary:  Negative for frequency, vaginal discharge and vaginal pain.  Skin:  Negative for color change and rash.  Allergic/Immunologic: Negative for immunocompromised state.  Neurological:  Negative for dizziness.  Psychiatric/Behavioral:  Negative for agitation.       Objective:    Physical Exam Vitals and nursing note reviewed.  Constitutional:      General: She is not in acute distress.    Appearance: Normal appearance. She is not ill-appearing.  HENT:     Head: Normocephalic and atraumatic.     Right Ear: External  ear normal.     Left Ear: External ear normal.     Nose: No congestion.  Eyes:     Extraocular Movements: Extraocular movements intact.     Conjunctiva/sclera: Conjunctivae normal.     Pupils: Pupils are equal, round, and reactive to light.  Cardiovascular:     Rate and Rhythm: Normal rate and regular rhythm.     Pulses: Normal pulses.     Heart sounds: Normal heart sounds.  Pulmonary:     Effort: Pulmonary effort is normal.     Breath sounds: Normal breath sounds. No wheezing.  Abdominal:     General: Bowel sounds are normal.     Palpations: Abdomen is soft.  Musculoskeletal:     Cervical back: Normal range of motion and neck supple.     Right lower leg: No edema.     Left lower leg: No edema.  Skin:    General: Skin is warm and dry.     Findings: No bruising.  Neurological:     General: No focal deficit present.     Mental Status: She is alert and oriented to person, place, and time.  Psychiatric:        Mood and Affect: Mood normal.        Behavior: Behavior normal.        Thought Content: Thought content normal.    BP 136/84    Pulse 77    Wt 215 lb (97.5 kg)    SpO2 97%    BMI 33.67 kg/m   BP Readings from Last 5 Encounters:  10/12/21 136/84  10/11/21 137/89  09/28/21 (!) 134/91  09/15/21 118/77  09/14/21 (!) 152/94    Wt Readings from Last 3 Encounters:  10/12/21 215 lb (97.5 kg)  10/11/21 217 lb (98.4 kg)  09/28/21 220 lb 5 oz (99.9 kg)    There are no preventive care reminders to display for this patient.  There are no preventive care reminders to display for this patient.   Lab Results  Component Value Date   TSH 3.71 06/23/2020   Lab Results  Component Value Date   WBC 4.9 10/11/2021   HGB 10.9 (L) 10/11/2021   HCT 32.8 (L) 10/11/2021   MCV 93.2 10/11/2021   PLT 278 10/11/2021   Lab Results  Component Value Date   NA 134 (L) 10/11/2021   K 4.2 10/11/2021   CHLORIDE 108 08/01/2017   CO2 26 10/11/2021   GLUCOSE 529 (HH) 10/11/2021    BUN 9 10/11/2021   CREATININE 0.79 10/11/2021   BILITOT 0.5 10/11/2021   ALKPHOS 89 10/11/2021   AST 13 (L) 10/11/2021   ALT 10 10/11/2021   PROT 7.2 10/11/2021   ALBUMIN 4.1 10/11/2021   CALCIUM 10.0 10/11/2021   ANIONGAP 6 10/11/2021   EGFR >60 08/01/2017   GFR 80.41 06/23/2020   Lab Results  Component Value Date   CHOL 206 (H) 03/08/2021   Lab Results  Component Value Date   HDL 46 03/08/2021   Lab Results  Component Value Date   LDLCALC 127 (H) 03/08/2021   Lab Results  Component Value Date   TRIG 186 (H) 03/08/2021   Lab Results  Component Value Date   CHOLHDL 4.5 (H) 03/08/2021  Lab Results  Component Value Date   HGBA1C 12.0 (A) 10/12/2021       Assessment & Plan:   Problem List Items Addressed This Visit       Cardiovascular and Mediastinum   Essential hypertension - Primary (Chronic)     Endocrine   Uncontrolled type 2 diabetes mellitus with hyperglycemia (HCC)   Relevant Medications   Semaglutide (RYBELSUS) 7 MG TABS   metFORMIN (GLUCOPHAGE) 1000 MG tablet   insulin glulisine (APIDRA) injection 6 Units   insulin glargine (LANTUS SOLOSTAR) 100 UNIT/ML Solostar Pen   Lancets Misc. (ACCU-CHEK SOFTCLIX LANCET DEV) KIT   Other Relevant Orders   HgB A1c (Completed)   Glucose (CBG) (Completed)   Ambulatory referral to Endocrinology   Referral to Nutrition and Diabetes Services   Glucose (CBG) (Completed)     Other   Mixed hyperlipidemia (Chronic)   Malignant neoplasm of lower-outer quadrant of left breast of female, estrogen receptor negative (HCC)   Relevant Medications   fluconazole (DIFLUCAN) 100 MG tablet   Other Relevant Orders   Ambulatory referral to Endocrinology   Other Visit Diagnoses     Vaginal candidiasis       Relevant Medications   fluconazole (DIFLUCAN) 100 MG tablet        Meds ordered this encounter  Medications   fluconazole (DIFLUCAN) 100 MG tablet    Sig: Take 2 tablets (200 mg total) by mouth daily. Take 2  tablets by mouth now, may repeat in 7 days if needed    Dispense:  4 tablet    Refill:  1    Order Specific Question:   Supervising Provider    Answer:   [name] [[id number]]   Semaglutide (RYBELSUS) 7 MG TABS    Sig: Take 7 mg by mouth daily.    Dispense:  90 tablet    Refill:  3    Order Specific Question:   Supervising Provider    Answer:   [name] [[id number]]   metFORMIN (GLUCOPHAGE) 1000 MG tablet    Sig: Take 1 tablet (1,000 mg total) by mouth 2 (two) times daily with a meal.    Dispense:  180 tablet    Refill:  3    Order Specific Question:   Supervising Provider    Answer:   [name] [[id number]]   insulin glulisine (APIDRA) injection 6 Units   insulin glargine (LANTUS SOLOSTAR) 100 UNIT/ML Solostar Pen    Sig: Inject 5 Units into the skin at bedtime.    Dispense:  15 mL    Refill:  5    Order Specific Question:   Supervising Provider    Answer:   [name] [[id number]]   Lancets Misc. (ACCU-CHEK SOFTCLIX LANCET DEV) KIT    Sig: 1 Units by Does not apply route 2 (two) times daily. Check FSBS BID - Include strips # 50 with 5 refills, Lancets #50 with 5 refills DX: Type 2 DM - ICD 10: E11.9    Dispense:  1 kit    Refill:  0    Order Specific Question:   Supervising Provider    Answer:   [name] [[id number]]   Start higher doses of diabetes medicine; Rybelsus 7 mg daily and Metformin 1,000 mg twice a day with food.  Start Lantus 5 units at bedtime tonight.  Check fasting blood sugar in the mornings, if blood sugar is above 150, give yourself 4 units of Aprida until gone.  An urgent  referral was ordered for the Endocrinologist and Diabetes Education.  Irene Pap, PA-C

## 2021-10-11 NOTE — Telephone Encounter (Signed)
This nurse reached out to patients daughter and made aware that patients blood glucose level was extremely high today.  This will be the second treatment that has to be postponed due due to elevated blood glucose.  Per provider it is strongly encouraged that the patient sees her primary care physician or endocrinology to manage her Diabetes.  Suggested to daughter that she attends the appointment with her mother so that she will receive proper care.  Daughter acknowledged understanding and has no further questions or concerns.    ?

## 2021-10-12 ENCOUNTER — Inpatient Hospital Stay: Payer: Medicare Other

## 2021-10-12 ENCOUNTER — Other Ambulatory Visit: Payer: Self-pay

## 2021-10-12 ENCOUNTER — Ambulatory Visit (INDEPENDENT_AMBULATORY_CARE_PROVIDER_SITE_OTHER): Payer: Medicare Other | Admitting: Physician Assistant

## 2021-10-12 ENCOUNTER — Encounter: Payer: Self-pay | Admitting: Physician Assistant

## 2021-10-12 ENCOUNTER — Telehealth: Payer: Self-pay | Admitting: Hematology

## 2021-10-12 VITALS — BP 136/84 | HR 77 | Ht 67.0 in | Wt 215.0 lb

## 2021-10-12 DIAGNOSIS — E1165 Type 2 diabetes mellitus with hyperglycemia: Secondary | ICD-10-CM | POA: Diagnosis not present

## 2021-10-12 DIAGNOSIS — Z171 Estrogen receptor negative status [ER-]: Secondary | ICD-10-CM

## 2021-10-12 DIAGNOSIS — I10 Essential (primary) hypertension: Secondary | ICD-10-CM | POA: Diagnosis not present

## 2021-10-12 DIAGNOSIS — E782 Mixed hyperlipidemia: Secondary | ICD-10-CM

## 2021-10-12 DIAGNOSIS — B3731 Acute candidiasis of vulva and vagina: Secondary | ICD-10-CM | POA: Diagnosis not present

## 2021-10-12 DIAGNOSIS — C50512 Malignant neoplasm of lower-outer quadrant of left female breast: Secondary | ICD-10-CM

## 2021-10-12 LAB — GLUCOSE, POCT (MANUAL RESULT ENTRY)
POC Glucose: 383 mg/dl — AB (ref 70–99)
POC Glucose: 410 mg/dl — AB (ref 70–99)

## 2021-10-12 LAB — POCT GLYCOSYLATED HEMOGLOBIN (HGB A1C): Hemoglobin A1C: 12 % — AB (ref 4.0–5.6)

## 2021-10-12 MED ORDER — ACCU-CHEK SOFTCLIX LANCET DEV KIT: 1.0000 [IU] | PACK | Freq: Two times a day (BID) | 0 refills | Status: DC

## 2021-10-12 MED ORDER — INSULIN GLULISINE 100 UNIT/ML IJ SOLN: 6.0000 [IU] | Freq: Once | INTRAMUSCULAR | Status: DC

## 2021-10-12 MED ORDER — METFORMIN HCL 1000 MG PO TABS: 1000.0000 mg | ORAL_TABLET | Freq: Two times a day (BID) | ORAL | 3 refills | Status: DC

## 2021-10-12 MED ORDER — RYBELSUS 7 MG PO TABS
7.0000 mg | ORAL_TABLET | Freq: Every day | ORAL | 3 refills | Status: DC
Start: 2021-10-12 — End: 2022-04-01

## 2021-10-12 MED ORDER — FLUCONAZOLE 100 MG PO TABS: 200.0000 mg | ORAL_TABLET | Freq: Every day | ORAL | 1 refills | Status: DC

## 2021-10-12 MED ORDER — LANTUS SOLOSTAR 100 UNIT/ML ~~LOC~~ SOPN: 5.0000 [IU] | PEN_INJECTOR | Freq: Every day | SUBCUTANEOUS | 5 refills | Status: DC

## 2021-10-12 NOTE — Patient Instructions (Addendum)
Start higher doses of diabetes medicine. ? ?Rybelsus 7 mg daily and Metformin 1,000 mg twice a day with food. ? ?Start Lantus 5 units at bedtime tonight. ? ?Check fasting blood sugar in the mornings, if blood sugar is above 150, give yourself 4 units of Aprida until gone. ? ?An urgent referral was ordered for the Endocrinologist and Diabetes Education. You will get a call to make that appointment.  ?

## 2021-10-12 NOTE — Telephone Encounter (Signed)
.  Called patient to schedule appointment per 3/1 inb, patient is aware of date and time.  ? ?

## 2021-10-16 ENCOUNTER — Other Ambulatory Visit (HOSPITAL_BASED_OUTPATIENT_CLINIC_OR_DEPARTMENT_OTHER): Payer: Self-pay

## 2021-10-17 ENCOUNTER — Inpatient Hospital Stay (HOSPITAL_BASED_OUTPATIENT_CLINIC_OR_DEPARTMENT_OTHER): Payer: Medicare Other | Admitting: Hematology

## 2021-10-17 ENCOUNTER — Inpatient Hospital Stay: Payer: Medicare Other

## 2021-10-17 ENCOUNTER — Other Ambulatory Visit: Payer: Self-pay

## 2021-10-17 ENCOUNTER — Encounter: Payer: Self-pay | Admitting: Hematology

## 2021-10-17 VITALS — BP 132/85 | HR 77 | Temp 98.6°F | Resp 18 | Ht 67.0 in | Wt 216.9 lb

## 2021-10-17 DIAGNOSIS — Z171 Estrogen receptor negative status [ER-]: Secondary | ICD-10-CM | POA: Diagnosis not present

## 2021-10-17 DIAGNOSIS — Z95828 Presence of other vascular implants and grafts: Secondary | ICD-10-CM

## 2021-10-17 DIAGNOSIS — Z170 Estrogen receptor positive status [ER+]: Secondary | ICD-10-CM | POA: Diagnosis not present

## 2021-10-17 DIAGNOSIS — J449 Chronic obstructive pulmonary disease, unspecified: Secondary | ICD-10-CM | POA: Diagnosis not present

## 2021-10-17 DIAGNOSIS — Z5112 Encounter for antineoplastic immunotherapy: Secondary | ICD-10-CM | POA: Diagnosis not present

## 2021-10-17 DIAGNOSIS — G620 Drug-induced polyneuropathy: Secondary | ICD-10-CM | POA: Diagnosis not present

## 2021-10-17 DIAGNOSIS — C50512 Malignant neoplasm of lower-outer quadrant of left female breast: Secondary | ICD-10-CM | POA: Diagnosis not present

## 2021-10-17 DIAGNOSIS — Z794 Long term (current) use of insulin: Secondary | ICD-10-CM | POA: Diagnosis not present

## 2021-10-17 DIAGNOSIS — C7951 Secondary malignant neoplasm of bone: Secondary | ICD-10-CM | POA: Diagnosis not present

## 2021-10-17 DIAGNOSIS — I10 Essential (primary) hypertension: Secondary | ICD-10-CM | POA: Diagnosis not present

## 2021-10-17 DIAGNOSIS — E1165 Type 2 diabetes mellitus with hyperglycemia: Secondary | ICD-10-CM | POA: Diagnosis not present

## 2021-10-17 DIAGNOSIS — Z5189 Encounter for other specified aftercare: Secondary | ICD-10-CM | POA: Diagnosis not present

## 2021-10-17 DIAGNOSIS — Z7189 Other specified counseling: Secondary | ICD-10-CM

## 2021-10-17 DIAGNOSIS — E559 Vitamin D deficiency, unspecified: Secondary | ICD-10-CM | POA: Diagnosis not present

## 2021-10-17 LAB — COMPREHENSIVE METABOLIC PANEL
ALT: 14 U/L (ref 0–44)
AST: 16 U/L (ref 15–41)
Albumin: 4.2 g/dL (ref 3.5–5.0)
Alkaline Phosphatase: 83 U/L (ref 38–126)
Anion gap: 6 (ref 5–15)
BUN: 7 mg/dL — ABNORMAL LOW (ref 8–23)
CO2: 27 mmol/L (ref 22–32)
Calcium: 9.4 mg/dL (ref 8.9–10.3)
Chloride: 104 mmol/L (ref 98–111)
Creatinine, Ser: 0.65 mg/dL (ref 0.44–1.00)
GFR, Estimated: >60 mL/min (ref >60)
Glucose, Bld: 370 mg/dL — ABNORMAL HIGH (ref 70–99)
Potassium: 3.9 mmol/L (ref 3.5–5.1)
Sodium: 137 mmol/L (ref 135–145)
Total Bilirubin: 0.6 mg/dL (ref 0.3–1.2)
Total Protein: 7.1 g/dL (ref 6.5–8.1)

## 2021-10-17 LAB — CBC WITH DIFFERENTIAL/PLATELET
Abs Immature Granulocytes: 0.01 10*3/uL (ref 0.00–0.07)
Basophils Absolute: 0 10*3/uL (ref 0.0–0.1)
Basophils Relative: 1 %
Eosinophils Absolute: 0.2 10*3/uL (ref 0.0–0.5)
Eosinophils Relative: 5 %
HCT: 33 % — ABNORMAL LOW (ref 36.0–46.0)
Hemoglobin: 11.1 g/dL — ABNORMAL LOW (ref 12.0–15.0)
Immature Granulocytes: 0 %
Lymphocytes Relative: 22 %
Lymphs Abs: 0.9 10*3/uL (ref 0.7–4.0)
MCH: 31.1 pg (ref 26.0–34.0)
MCHC: 33.6 g/dL (ref 30.0–36.0)
MCV: 92.4 fL (ref 80.0–100.0)
Monocytes Absolute: 0.4 10*3/uL (ref 0.1–1.0)
Monocytes Relative: 9 %
Neutro Abs: 2.4 10*3/uL (ref 1.7–7.7)
Neutrophils Relative %: 63 %
Platelets: 249 10*3/uL (ref 150–400)
RBC: 3.57 MIL/uL — ABNORMAL LOW (ref 3.87–5.11)
RDW: 13.7 % (ref 11.5–15.5)
WBC: 3.9 10*3/uL — ABNORMAL LOW (ref 4.0–10.5)
nRBC: 0 % (ref 0.0–0.2)

## 2021-10-17 MED ORDER — INSULIN ASPART 100 UNIT/ML IJ SOLN
6.0000 [IU] | Freq: Once | INTRAMUSCULAR | Status: AC
  Administered 2021-10-17: 6 [IU] via SUBCUTANEOUS
  Filled 2021-10-17: qty 1

## 2021-10-17 MED ORDER — FAMOTIDINE IN NACL 20-0.9 MG/50ML-% IV SOLN
20.0000 mg | Freq: Once | INTRAVENOUS | Status: AC
  Administered 2021-10-17: 20 mg via INTRAVENOUS
  Filled 2021-10-17: qty 50

## 2021-10-17 MED ORDER — DIPHENHYDRAMINE HCL 50 MG/ML IJ SOLN
50.0000 mg | Freq: Once | INTRAMUSCULAR | Status: AC
  Administered 2021-10-17: 50 mg via INTRAVENOUS
  Filled 2021-10-17: qty 1

## 2021-10-17 MED ORDER — SODIUM CHLORIDE 0.9 % IV SOLN
800.0000 mg | Freq: Once | INTRAVENOUS | Status: AC
  Administered 2021-10-17: 800 mg via INTRAVENOUS
  Filled 2021-10-17: qty 80

## 2021-10-17 MED ORDER — SODIUM CHLORIDE 0.9 % IV SOLN: Freq: Once | INTRAVENOUS | Status: AC

## 2021-10-17 MED ORDER — SODIUM CHLORIDE 0.9 % IV SOLN
150.0000 mg | Freq: Once | INTRAVENOUS | Status: AC
  Administered 2021-10-17: 150 mg via INTRAVENOUS
  Filled 2021-10-17: qty 150

## 2021-10-17 MED ORDER — ATROPINE SULFATE 1 MG/ML IV SOLN: 0.5000 mg | Freq: Once | INTRAVENOUS | Status: DC | PRN

## 2021-10-17 MED ORDER — SODIUM CHLORIDE 0.9% FLUSH
10.0000 mL | INTRAVENOUS | Status: DC | PRN
  Administered 2021-10-17: 10 mL

## 2021-10-17 MED ORDER — ALTEPLASE 2 MG IJ SOLR: 2.0000 mg | Freq: Once | INTRAMUSCULAR | Status: DC | PRN

## 2021-10-17 MED ORDER — ACETAMINOPHEN 325 MG PO TABS
650.0000 mg | ORAL_TABLET | Freq: Once | ORAL | Status: AC
  Administered 2021-10-17: 650 mg via ORAL
  Filled 2021-10-17: qty 2

## 2021-10-17 MED ORDER — SODIUM CHLORIDE 0.9% FLUSH
10.0000 mL | INTRAVENOUS | Status: DC | PRN
  Administered 2021-10-17: 10 mL via INTRAVENOUS

## 2021-10-17 MED ORDER — HEPARIN SOD (PORK) LOCK FLUSH 100 UNIT/ML IV SOLN
500.0000 [IU] | Freq: Once | INTRAVENOUS | Status: AC | PRN
  Administered 2021-10-17: 500 [IU]

## 2021-10-17 MED ORDER — PALONOSETRON HCL INJECTION 0.25 MG/5ML
0.2500 mg | Freq: Once | INTRAVENOUS | Status: AC
  Administered 2021-10-17: 0.25 mg via INTRAVENOUS
  Filled 2021-10-17: qty 5

## 2021-10-17 NOTE — Progress Notes (Signed)
Per Dr. Burr Medico, administer 6 units of short acting insulin for elevated blood glucose.  RN notified Lanice Schwab to enter order. ?

## 2021-10-17 NOTE — Progress Notes (Signed)
Patient monitored for 30 minute post observation period without incident. Ambulatory to lobby.  ?

## 2021-10-17 NOTE — Progress Notes (Signed)
[place]   [phone] [phone]   Clinic Follow up Note   Patient Care Team: [name] as PCP - General (Physician Assistant) [name], MD as PCP - Cardiology (Cardiology) [name], MD as Consulting Physician (Hematology) [name], MD as Consulting Physician (General Surgery)  Date of Service:  10/17/2021  CHIEF COMPLAINT: f/u of metastatic breast cancer  CURRENT THERAPY:  [name], given on days 1 and 8 every 21 day cycle, starting 07/03/21  ASSESSMENT & PLAN:  Kristin Ward is a 66 y.o. female with   1. Left breast invasive ductal carcinoma, stage IB, p(T1cN0M0), Triple negative, Grade 3, in 2018, metastatic disease in 08/2020 ER 20% weakly + PR/HER2 (0) negative, PD-L1 0%, genetics (-) -diagnosed with triple negative breast cancer in 05/2017. She is s/p left breast lumpectomy, adjuvant chemo TC and Radiation.  -Genetic testing was negative for pathogenetic mutations.  -Due to new right gluteal pain, she underwent biopsy on 09/02/20 which showed metastatic carcinoma, consistent with breast primary. ER 20% positive, ER and HER2 negative.  -She started weekly Taxol on 09/26/20 for 3 weeks on/ 1 week off. Due to worsened neuropathy, Taxol stopped after C4 on 12/18/20. I switched her to Xeloda on 01/01/21 at 1546m in the AM and 20079min the PM 2 weeks on/1 week off. We increased to full dose Xeloda, 2000 mg BID, on 01/22/21. -[place] 07/03[place]. She again experienced nausea as well as severe diarrhea after cycle 2, but was able to recover well. I reduced her dose from C4. We will also discontinue dexa. -restaging CT CAP and bone scan on 09/27/21 showed improvement to lymphadenopathy and stable osseous metastatic disease. - Given good response, we will continue. Chemo has been held in the past months due to her uncontrolled hyperglycemia. Labs reviewed. Despite BG of 370 today, we will proceed with trodelvy.  We will also give her insulin prior to treatment.   2. Diabetes and uncontrolled hyperglycemia -worsening hyperglycemia lately, probably related to dexa (before chemo) and TrIvette Loyal-she saw her PCP on 10/12/21, who added insulin and increased her metformin and semaglutide. -we discussed her diet, which has a lot of personal restrictions. I recommend she talk to our dietician for further recommendations.   3. Right hip pain, Right iliac bone met, Vit D deficiency -She had radiating right hip pain since 2020 from right iliac wing mass -She completed Palliative radiation to right iliac 09/04/20 - 09/22/20 with Dr. MoLisbeth Renshawand her pain is much improved.  -Given localized bone met, she started Zometa q3m90monthn 11/20/20. Most recent dose 08/03/21 -vit D has remained low, most recently 20.86 on 08/17/21.   4. G2 peripheral Neuropathy  -S/p C2D8 Taxol she developed mild tingling in her hands and feet.  -She uses ice bags at home as needed.  -She continues to have moderate numbness in her hands and light tingling in her feet. She can take B12 or acupuncture.  -Taxol stopped after 12/18/20. Her neuropathy is improving.  -Overall stable   5. DM, HTN, COPD, retinitis pigmentosa with limited vision, Chronic combined systolic and diastolic heart failure, EF 45-50% -Continue to follow-up with PCP Dr LalRedmond Schoold cardiologist Dr HarEllyn Hackshe is legally blind -She recently tried insulin, but it made her sick, and she has stopped. She is on oral diabetic medicine and checking her sugars at home.    6. Goal of care discussion  -The patient understands the goal  of care is palliative. -she is full code now      Plan -proceed with [name] today  -will give insulin 6u given BG of 370 today -lab, flush, and Trodelvy in 1, 3, and 4 weeks -f/u in 3 weeks   No problem-specific Assessment & Plan notes found for this encounter.   SUMMARY OF ONCOLOGIC HISTORY: Oncology History Overview Note  Cancer  Staging Malignant neoplasm of lower-outer quadrant of left breast of female, estrogen receptor negative ([place]) Staging form: Breast, AJCC 8th Edition - Clinical stage from 06/06/2017: Stage IB (cT1c, cN0, cM0, G3, ER-, PR-, HER2-) - Signed by [name], MD on 06/15/2017 Nuclear grade: G3 Histologic grading system: 3 grade system - Pathologic stage from 06/26/2017: Stage IB (pT1c, pN0, cM0, G3, ER-, PR-, HER2-) - Signed by [name], MD on 07/10/2017 Neoadjuvant therapy: No Nuclear grade: G3 Multigene prognostic tests performed: None Histologic grading system: 3 grade system Laterality: Left     Malignant neoplasm of lower-outer quadrant of left breast of female, estrogen receptor negative ([place])  05/30/2017 Mammogram   Diagnostic mammo and US IMPRESSION: 1. Highly suspicious 1.4 cm mass in the slightly lower slightly outer left breast -tissue sampling recommended. 2. Indeterminate 0.5 mm mass in the slightly lower slightly outer left breast -tissue sampling recommended. 3. At least 2 left axillary lymph nodes with borderline cortical thickness.   06/06/2017 Initial Biopsy   Diagnosis 1. Breast, left, needle core biopsy, 5:30 o'clock - INVASIVE DUCTAL CARCINOMA, G3 2. Lymph node, needle/core biopsy, left axillary - NO CARCINOMA IDENTIFIED IN ONE LYMPH NODE (0/1)   06/06/2017 Initial Diagnosis   Malignant neoplasm of lower-outer quadrant of left breast of female, estrogen receptor negative ([place])   06/06/2017 Receptors her2   Estrogen Receptor: 0%, NEGATIVE Progesterone Receptor: 0%, NEGATIVE Proliferation Marker Ki67: 70%   06/26/2017 Surgery   LEFT BREAST LUMPECTOMY WITH RADIOACTIVE SEED AND SENTINEL LYMPH NODE BIOPSY ERAS  PATHWAY AND INSERTION PORT-A-CATH By Dr. Barry Dienes on 06/26/17    06/26/2017 Pathology Results   Diagnosis 06/26/17  1. Breast, lumpectomy, Left - INVASIVE DUCTAL CARCINOMA, GRADE III/III, SPANNING 1.2 CM. - THE SURGICAL RESECTION MARGINS ARE NEGATIVE FOR  CARCINOMA. - SEE ONCOLOGY TABLE BELOW. 2. Lymph node, sentinel, biopsy, Left axillary #1 - THERE IS NO EVIDENCE OF CARCINOMA IN 1 OF 1 LYMPH NODE (0/1). 3. Lymph node, sentinel, biopsy, Left axillary #2 - THERE IS NO EVIDENCE OF CARCINOMA IN 1 OF 1 LYMPH NODE (0/1). 4. Lymph node, sentinel, biopsy, Left axillary #3 - THERE IS NO EVIDENCE OF CARCINOMA IN 1 OF 1 LYMPH NODE (0/1).    07/25/2017 - 09/29/2017 Chemotherapy   Adjuvant cytoxan and docetaxel (TC) every 3 weekd for 4 cycles     11/19/2017 - 12/17/2017 Radiation Therapy   Adjuvant breast radiation Left breast treated to 42.5 Gy with 17 fx of 2.5 Gy followed by a boost of 7.5 Gy with 3 fx of 2.5 Gy      02/2019 Procedure   She had PAC removal in 02/2019.    07/23/2020 Imaging   MRI Lumbar Spine 07/23/20 IMPRESSION: 1. No significant disc herniation, spinal canal or neural foraminal stenosis at any level. 2. Moderate facet degenerative changes at L3-4, L4-5 and L5-S1. 3. Multiple enlarged retroperitoneal and right iliac lymph nodes. Recommend correlation with CT of the abdomen and pelvis with contrast.   08/15/2020 Imaging   CT AP 08/15/20  IMPRESSION: 1. Right iliac and periaortic adenopathy is noted concerning for metastatic disease or lymphoma. Also  noted is abnormal soft tissue mass anterior and posterior to the right iliac wing concerning for malignancy or metastatic disease. MRI is recommended for further evaluation. 2. Irregular lucency with sclerotic margins is seen involving the right iliac crest concerning for possible lytic lesion. MRI may be performed for further evaluation. 3. Enlarged fibroid uterus. 4. Small gallstone. 5. Aortic atherosclerosis.   08/22/2020 Pathology Results   FINAL MICROSCOPIC DIAGNOSIS: 08/22/20  A. NEEDLE CORE, RIGHT GLUTEUS MINIMUMUS, BIOPSY:  -  Metastatic carcinoma  -  See comment   COMMENT:  By immunohistochemistry, the neoplastic cells are positive for  cytokeratin-7 and have  weak patchy positivity for GATA3 but are negative for cytokeratin-20 and GCDFP.  The combined morphology and immunophenotype, support metastasis from the patient's known breast primary carcinoma.   ADDENDUM:  PROGNOSTIC INDICATOR RESULTS:  The tumor cells are NEGATIVE for Her2 (0).  Estrogen Receptor:       POSITIVE, 20%, WEAK STAINING  Progesterone Receptor:   NEGATIVE   PD-L1 (0) negative    09/04/2020 - 09/22/2020 Radiation Therapy   Palliative radiation to right iliac 09/04/20 - 09/22/20, Dr. Lisbeth Renshaw   09/26/2020 -  Chemotherapy   first line weekly Taxol 3 weeks on/1 week off starting 09/26/20 --Reduced to 2 weeks on/1 week off starting with C3 due to neuropathy. Stopped after C4 on 12/18/20 due to neuratrophy.  --Switched to Xeloda on 01/01/21 at 1563m in the AM and 20030min the PM for 2 weeks on/1 week off     10/13/2020 Procedure    PAC placement on 10/13/20.    04/10/2021 Imaging   CT CAP  IMPRESSION: 1. Enlarging abdominopelvic retroperitoneal and right inguinal adenopathy, compatible with disease progression. 2. Lytic metastasis in the right iliac wing, similar. 3. Hepatomegaly. 4. Cholelithiasis. 5. Enlarged fibroid uterus. 6.  Aortic atherosclerosis (ICD10-I70.0).   06/18/2021 Imaging   CT CAP  IMPRESSION: 1. Today's study demonstrates progression of metastatic disease as evidence by increased number and size of numerous enlarged lymph nodes in the retroperitoneum, along the right pelvic sidewall, and in the upper right thigh. 2. Osseous metastases in the bony pelvis appear similar to the prior examination. No definite new osseous lesions are otherwise noted. 3. Fibroid uterus again noted. 4. Cholelithiasis without evidence of acute cholecystitis. 5. Aortic atherosclerosis, in addition to left anterior descending coronary artery disease. Please note that although the presence of coronary artery calcium documents the presence of coronary artery disease, the severity of this  disease and any potential stenosis cannot be assessed on this non-gated CT examination. Assessment for potential risk factor modification, dietary therapy or pharmacologic therapy may be warranted, if clinically indicated. 6. Additional incidental findings, as above.   06/20/2021 Imaging   Bone Scan  IMPRESSION: No osseous uptake suspicious for recurrent/progressive metastatic disease.   07/03/2021 -  Chemotherapy   Patient is on Treatment Plan : BREAST METASTATIC Sacituzumab govitecan-hziy (TIvette [id number]     09/27/2021 Imaging   EXAM: CT CHEST, ABDOMEN, AND PELVIS WITH CONTRAST  IMPRESSION: 1. Marked interval improvement in previously demonstrated lymphadenopathy in the abdomen and pelvis. A few prominent right inguinal lymph nodes remain. 2. Stable osseous metastatic disease in the pelvis. 3. No evidence of local recurrence or other metastatic disease. 4. Stable incidental findings including cholelithiasis, uterine fibroids and Aortic Atherosclerosis (ICD10-I70.0).  ADDENDUM: Not mentioned in the original impression is a possible nonocclusive fibrin sheath along the distal aspect of the Port-A-Cath.   09/27/2021 Imaging   EXAM: NUCLEAR MEDICINE WHOLE BODY  BONE SCAN  IMPRESSION: Stable examination, without abnormal osseous uptake.      INTERVAL HISTORY:  January Bergthold Termini is here for a follow up of metastatic breast cancer. She was last seen by me on 09/28/21. She presents to the clinic alone. She reports she is "disappointed" with her diabetes medication. She also reports her hands are dry and cracking. She is wearing gloves to hold in moisture. She reports she is trying to get back into walking. She states she has started walking around a track. She notes she is not doing anything strenuous because of her hip, but she is building up.   All other systems were reviewed with the patient and are negative.  MEDICAL HISTORY:  Past Medical History:  Diagnosis Date    Abnormal x-ray of lungs with single pulmonary nodule 03/15/2016   per records from [place]    Anemia    Asthma    Cholelithiasis 05/19/2017   On CT   Coronary artery calcification seen on CAT scan 05/19/2017   Diabetes mellitus without complication ([place])    type 2   Diabetes mellitus, new onset ([place]) 01/20/2019   Dilated idiopathic cardiomyopathy ([place]) 12/2015   EF 15-20%. Diagnosed in [facility]   Headache    NONE RECENT   Hypertension    Lymph nodes enlarged 07/25/2020   Malignant neoplasm of lower-outer quadrant of left breast of female, estrogen receptor negative ([place]) 06/11/2017   left breast   Mixed hyperlipidemia 06/17/2018   partially blind    Personal history of chemotherapy 2018-2019   Personal history of radiation therapy 2019   Retinitis pigmentosa    Retroperitoneal lymphadenopathy 07/25/2020   Uveitis     SURGICAL HISTORY: Past Surgical History:  Procedure Laterality Date   brain cyst removed  2007   to help with headaches per patient , aspirated    BREAST BIOPSY Left 06/06/2017   x2   BREAST CYST ASPIRATION Left 06/06/2017   BREAST LUMPECTOMY Left 06/26/2017   BREAST LUMPECTOMY WITH RADIOACTIVE SEED AND SENTINEL LYMPH NODE BIOPSY Left 06/26/2017   Procedure: BREAST LUMPECTOMY WITH RADIOACTIVE SEED AND SENTINEL LYMPH NODE BIOPSY ERAS  PATHWAY;  Surgeon: [name], MD;  Location: [facility];  Service: General;  Laterality: Left;  pec block   FINE NEEDLE ASPIRATION Left 02/23/2019   Procedure: FINE NEEDLE ASPIRATION;  Surgeon: [name], MD;  Location: [facility];  Service: General;  Laterality: Left;  Asiration of left breast seroma    IR IMAGING GUIDED PORT INSERTION  10/13/2020   NASAL TURBINATE REDUCTION     PORT-A-CATH REMOVAL Right 02/23/2019   Procedure: REMOVAL PORT-A-CATH;  Surgeon: [name], MD;  Location: [facility];  Service: General;  Laterality: Right;   PORTACATH PLACEMENT N/A 06/26/2017    Procedure: INSERTION PORT-A-CATH;  Surgeon: [name], MD;  Location: [facility];  Service: General;  Laterality: N/A;   TRANSTHORACIC ECHOCARDIOGRAM  12/2015   A) [facility] May 2017: Mild concentric LVH. Global hypokinesis. GR daily. EF 18% severe LA dilation. Mitral annular dilatation with papillary muscle dysfunction and moderate MR. Dilated IVC consistent with elevated RAP.   TRANSTHORACIC ECHOCARDIOGRAM  05/2017; 08/2017   a) Mild concentric LVH.  EF 45 to 50% with diffuse hypokinesis.  GR 1 DD.  Mild aortic root dilation.;; b)  Mild LVH EF 45 to 50%.  Diffuse HK.  No significant valve disease.  No significant change.    I have reviewed the social history and family history with  the patient and they are unchanged from previous note.  ALLERGIES:  is allergic to morphine and related, latex, tylenol with codeine #3 [acetaminophen-codeine], and penicillins.  MEDICATIONS:  Current Outpatient Medications  Medication Sig Dispense Refill   Accu-Chek FastClix Lancets MISC TEST TWICE A DAY. PT USES AN ACCU-CHEK GUIDE ME METER 102 each 2   aspirin 81 MG chewable tablet Chew 81 mg by mouth daily. (Patient not taking: Reported on 10/12/2021)     atorvastatin (LIPITOR) 20 MG tablet Take 1 tablet (20 mg total) by mouth daily. 90 tablet 3   Blood Glucose Calibration (ACCU-CHEK GUIDE CONTROL VI) In Vitro     carvedilol (COREG) 12.5 MG tablet Take 1 tablet (12.5 mg total) by mouth 2 (two) times daily with a meal. 180 tablet 1   Cholecalciferol (D3-1000 PO) Take by mouth.     fluconazole (DIFLUCAN) 100 MG tablet Take 2 tablets (200 mg total) by mouth daily. Take 2 tablets by mouth now, may repeat in 7 days if needed 4 tablet 1   furosemide (LASIX) 20 MG tablet TAKE 2 TABLETS (40 MG TOTAL) BY MOUTH AS NEEDED. FOR WEIGHT GAIN 3 TO 4 LBS. 90 tablet 3   glucose blood (ACCU-CHEK GUIDE) test strip Use as instructed to test blood sugar 2 times a day 100 strip 2   ibuprofen (ADVIL) 800 MG tablet Take 1  tablet (800 mg total) by mouth every 8 (eight) hours as needed. 30 tablet 2   insulin glargine (LANTUS SOLOSTAR) 100 UNIT/ML Solostar Pen Inject 5 Units into the skin at bedtime. 15 mL 5   Lancets Misc. (ACCU-CHEK SOFTCLIX LANCET DEV) KIT 1 Units by Does not apply route 2 (two) times daily. Check FSBS BID - Include strips # 50 with 5 refills, Lancets #50 with 5 refills DX: Type 2 DM - ICD 10: E11.9 1 kit 0   levofloxacin (LEVAQUIN) 500 MG tablet Take 500 mg by mouth daily.     lidocaine-prilocaine (EMLA) cream Apply 1 application topically as needed. 30 g 0   linaclotide (LINZESS) 72 MCG capsule Take 1 capsule (72 mcg total) by mouth daily before breakfast. MUST HAVE APPOINTMENT FOR REFILLS 30 capsule 1   metFORMIN (GLUCOPHAGE) 1000 MG tablet Take 1 tablet (1,000 mg total) by mouth 2 (two) times daily with a meal. 180 tablet 3   morphine (MS CONTIN) 15 MG 12 hr tablet Oral (Patient not taking: Reported on 10/12/2021)     Multiple Vitamins-Minerals (WOMENS MULTI PO) Take by mouth.     ondansetron (ZOFRAN) 8 MG tablet Take 1 tablet (8 mg total) by mouth every 8 (eight) hours as needed for nausea or vomiting (begin on day 3 after chemo). 30 tablet 1   oxyCODONE (OXY IR/ROXICODONE) 5 MG immediate release tablet Oral     oxyCODONE (OXYCONTIN) 10 mg 12 hr tablet Oral     oxyCODONE-acetaminophen (PERCOCET/ROXICET) 5-325 MG tablet Oral     pantoprazole (PROTONIX) 40 MG tablet TAKE 1 TABLET BY MOUTH EVERY DAY 30 tablet 0   prochlorperazine (COMPAZINE) 10 MG tablet Take 1 tablet (10 mg total) by mouth every 6 (six) hours as needed (Nausea or vomiting). (Patient not taking: Reported on 09/13/2021) 30 tablet 1   sacubitril-valsartan (ENTRESTO) 97-103 MG Take 1 tablet by mouth 2 (two) times daily. 180 tablet 3   Semaglutide (RYBELSUS) 7 MG TABS Take 7 mg by mouth daily. 90 tablet 3   spironolactone (ALDACTONE) 25 MG tablet Take 1 tablet (25 mg total) by mouth daily. (Patient  not taking: Reported on 10/12/2021) 90  tablet 3   vitamin B-12 (CYANOCOBALAMIN) 500 MCG tablet Take 500 mcg by mouth daily.     vitamin C (ASCORBIC ACID) 500 MG tablet Take 1,000 mg 2 (two) times daily by mouth.     Current Facility-Administered Medications  Medication Dose Route Frequency Provider Last Rate Last Admin   insulin glulisine (APIDRA) injection 6 Units  6 Units Subcutaneous Once Williams, Lynne B, PA-C        PHYSICAL EXAMINATION: ECOG PERFORMANCE STATUS: 1 - Symptomatic but completely ambulatory  Vitals:   10/17/21 1134  BP: 132/85  Pulse: 77  Resp: 18  Temp: 98.6 F (37 C)  SpO2: 100%   Wt Readings from Last 3 Encounters:  10/17/21 216 lb 14.4 oz (98.4 kg)  10/12/21 215 lb (97.5 kg)  10/11/21 217 lb (98.4 kg)     GENERAL:alert, no distress and comfortable SKIN: skin color normal, no rashes or significant lesions EYES: normal, Conjunctiva are pink and non-injected, sclera clear  NEURO: alert & oriented x 3 with fluent speech  LABORATORY DATA:  I have reviewed the data as listed CBC Latest Ref Rng & Units 10/17/2021 10/11/2021 09/28/2021  WBC 4.0 - 10.5 K/uL 3.9(L) 4.9 8.1  Hemoglobin 12.0 - 15.0 g/dL 11.1(L) 10.9(L) 11.6(L)  Hematocrit 36.0 - 46.0 % 33.0(L) 32.8(L) 34.5(L)  Platelets 150 - 400 K/uL 249 278 226     CMP Latest Ref Rng & Units 10/17/2021 10/11/2021 09/28/2021  Glucose 70 - 99 mg/dL 370(H) 529(HH) 447(H)  BUN 8 - 23 mg/dL 7(L) 9 7(L)  Creatinine 0.44 - 1.00 mg/dL 0.65 0.79 0.78  Sodium 135 - 145 mmol/L 137 134(L) 136  Potassium 3.5 - 5.1 mmol/L 3.9 4.2 4.1  Chloride 98 - 111 mmol/L 104 102 104  CO2 22 - 32 mmol/L _0 Calcium 8.9 - 10.3 mg/dL 9.4 10.0 9.8  Total Protein 6.5 - 8.1 g/dL 7.1 7.2 7.4  Total Bilirubin 0.3 - 1.2 mg/dL 0.6 0.5 0.5  Alkaline Phos 38 - 126 U/L 83 89 135(H)  AST 15 - 41 U/L 16 13(L) 13(L)  ALT 0 - 44 U/L _1 RADIOGRAPHIC STUDIES: I have personally reviewed the radiological images as listed and agreed with the findings in the report. No  results found.    No orders of the defined types were placed in this encounter.  All questions were answered. The patient knows to call the clinic with any problems, questions or concerns. No barriers to learning was detected. The total time spent in the appointment was 30 minutes.     [name], MD 10/17/2021   I, Wilburn Mylar, am acting as scribe for [name], MD.   I have reviewed the above documentation for accuracy and completeness, and I agree with the above.

## 2021-10-17 NOTE — Patient Instructions (Signed)
[facility]  Discharge Instructions: ?Thank you for choosing [facility] to provide your oncology and hematology care.  ? ?If you have a lab appointment with the [facility], please go directly to the [facility] and check in at the registration area. ?  ?Wear comfortable clothing and clothing appropriate for easy access to any Portacath or PICC line.  ? ?We strive to give you quality time with your provider. You may need to reschedule your appointment if you arrive late (15 or more minutes).  Arriving late affects you and other patients whose appointments are after yours.  Also, if you miss three or more appointments without notifying the office, you may be dismissed from the clinic at the provider?s discretion.    ?  ?For prescription refill requests, have your pharmacy contact our office and allow 72 hours for refills to be completed.   ? ?Today you received the following chemotherapy and/or immunotherapy agent: Sacituzumab-govitecan [name]) ?  ?To help prevent nausea and vomiting after your treatment, we encourage you to take your nausea medication as directed. ? ?BELOW ARE SYMPTOMS THAT SHOULD BE REPORTED IMMEDIATELY: ?*FEVER GREATER THAN 100.4 F (38 ?C) OR HIGHER ?*CHILLS OR SWEATING ?*NAUSEA AND VOMITING THAT IS NOT CONTROLLED WITH YOUR NAUSEA MEDICATION ?*UNUSUAL SHORTNESS OF BREATH ?*UNUSUAL BRUISING OR BLEEDING ?*URINARY PROBLEMS (pain or burning when urinating, or frequent urination) ?*BOWEL PROBLEMS (unusual diarrhea, constipation, pain near the anus) ?TENDERNESS IN MOUTH AND THROAT WITH OR WITHOUT PRESENCE OF ULCERS (sore throat, sores in mouth, or a toothache) ?UNUSUAL RASH, SWELLING OR PAIN  ?UNUSUAL VAGINAL DISCHARGE OR ITCHING  ? ?Items with * indicate a potential emergency and should be followed up as soon as possible or go to the Emergency Department if any problems should occur. ? ?Please show the CHEMOTHERAPY ALERT CARD or IMMUNOTHERAPY ALERT  CARD at check-in to the Emergency Department and triage nurse. ? ?Should you have questions after your visit or need to cancel or reschedule your appointment, please contact [facility]  Dept: [phone]  and follow the prompts.  Office hours are 8:00 a.m. to 4:30 p.m. Monday - Friday. Please note that voicemails left after 4:00 p.m. may not be returned until the following business day.  We are closed weekends and major holidays. You have access to a nurse at all times for urgent questions. Please call the main number to the clinic Dept: [phone] and follow the prompts. ? ? ?For any non-urgent questions, you may also contact your provider using MyChart. We now offer e-Visits for anyone 30 and older to request care online for non-urgent symptoms. For details visit [email or web address]. ?  ?Also download the MyChart app! Go to the app store, search "MyChart", open the app, select [facility], and log in with your MyChart username and password. ? ?Due to Covid, a mask is required upon entering the hospital/clinic. If you do not have a mask, one will be given to you upon arrival. For doctor visits, patients may have 1 support person aged 93 or older with them. For treatment visits, patients cannot have anyone with them due to current Covid guidelines and our immunocompromised population.  ? ?

## 2021-10-18 ENCOUNTER — Telehealth: Payer: Self-pay | Admitting: Hematology

## 2021-10-18 NOTE — Telephone Encounter (Signed)
Scheduled follow-up appointments per 3/9 los. Patient is aware. ?

## 2021-10-19 ENCOUNTER — Other Ambulatory Visit: Payer: Self-pay | Admitting: Physician Assistant

## 2021-10-19 ENCOUNTER — Ambulatory Visit: Payer: Medicare Other

## 2021-10-19 DIAGNOSIS — E1165 Type 2 diabetes mellitus with hyperglycemia: Secondary | ICD-10-CM

## 2021-10-19 MED ORDER — LANTUS SOLOSTAR 100 UNIT/ML ~~LOC~~ SOPN: 10.0000 [IU] | PEN_INJECTOR | Freq: Every day | SUBCUTANEOUS | 5 refills | Status: DC

## 2021-10-19 NOTE — Progress Notes (Signed)
Called patient to let her know that I saw her last blood sugar of 370 from her visit with Oncology on 10/17/2021. She needs to increase to lantus 10 units at night and I will order a new prescription stating that.  ?Her next office visit here is on 11/26/2021. ?

## 2021-10-22 ENCOUNTER — Telehealth: Payer: Self-pay | Admitting: Physician Assistant

## 2021-10-22 NOTE — Telephone Encounter (Signed)
Referral Followup °

## 2021-10-23 ENCOUNTER — Telehealth: Payer: Self-pay | Admitting: Physician Assistant

## 2021-10-23 NOTE — Progress Notes (Signed)
?  Chronic Care Management  ? ?Note ? ?10/23/2021 ?Name: [name] MRN: [id number] DOB: 1956/01/31 ? ?Kristin Ward is a 66 y.o. year old female who is a primary care patient of [name]. I reached out to [place] by phone today in response to a referral sent by Ms. Sonny Masters Dueitt's PCP, [name].  ? ?[name] was given information about Chronic Care Management services today including:  ?CCM service includes personalized support from designated clinical staff supervised by her physician, including individualized plan of care and coordination with other care providers ?24/7 contact phone numbers for assistance for urgent and routine care needs. ?Service will only be billed when office clinical staff spend 20 minutes or more in a month to coordinate care. ?Only one practitioner may furnish and bill the service in a calendar month. ?The patient may stop CCM services at any time (effective at the end of the month) by phone call to the office staff. ? ? ?Patient agreed to services and verbal consent obtained.  ? ?Follow up plan: ? ? ?Tatjana Dellinger ?Upstream Scheduler  ?

## 2021-10-23 NOTE — Chronic Care Management (AMB) (Signed)
?  Chronic Care Management  ? ?Outreach Note ? ?10/23/2021 ?Name: Kristin Ward MRN: [id number] DOB: March 01, 1956 ? ?Referred by: Irene Pap, PA-C ?Reason for referral : No chief complaint on file. ? ? ?An unsuccessful telephone outreach was attempted today. The patient was referred to the pharmacist for assistance with care management and care coordination.  ? ?Follow Up Plan:  ? ?Kristin Ward ?Upstream Scheduler  ?

## 2021-10-24 ENCOUNTER — Encounter: Payer: Self-pay | Admitting: *Deleted

## 2021-10-24 ENCOUNTER — Other Ambulatory Visit: Payer: Self-pay | Admitting: *Deleted

## 2021-10-24 ENCOUNTER — Inpatient Hospital Stay: Payer: Medicare Other

## 2021-10-24 ENCOUNTER — Other Ambulatory Visit: Payer: Self-pay

## 2021-10-24 VITALS — BP 135/91 | HR 101 | Resp 18 | Wt 210.0 lb

## 2021-10-24 DIAGNOSIS — R739 Hyperglycemia, unspecified: Secondary | ICD-10-CM

## 2021-10-24 DIAGNOSIS — E559 Vitamin D deficiency, unspecified: Secondary | ICD-10-CM | POA: Diagnosis not present

## 2021-10-24 DIAGNOSIS — C7951 Secondary malignant neoplasm of bone: Secondary | ICD-10-CM | POA: Diagnosis not present

## 2021-10-24 DIAGNOSIS — Z171 Estrogen receptor negative status [ER-]: Secondary | ICD-10-CM

## 2021-10-24 DIAGNOSIS — Z7189 Other specified counseling: Secondary | ICD-10-CM

## 2021-10-24 DIAGNOSIS — I10 Essential (primary) hypertension: Secondary | ICD-10-CM | POA: Diagnosis not present

## 2021-10-24 DIAGNOSIS — Z794 Long term (current) use of insulin: Secondary | ICD-10-CM | POA: Diagnosis not present

## 2021-10-24 DIAGNOSIS — C50512 Malignant neoplasm of lower-outer quadrant of left female breast: Secondary | ICD-10-CM | POA: Diagnosis not present

## 2021-10-24 DIAGNOSIS — Z5112 Encounter for antineoplastic immunotherapy: Secondary | ICD-10-CM | POA: Diagnosis not present

## 2021-10-24 DIAGNOSIS — Z170 Estrogen receptor positive status [ER+]: Secondary | ICD-10-CM | POA: Diagnosis not present

## 2021-10-24 DIAGNOSIS — E1165 Type 2 diabetes mellitus with hyperglycemia: Secondary | ICD-10-CM | POA: Diagnosis not present

## 2021-10-24 DIAGNOSIS — Z95828 Presence of other vascular implants and grafts: Secondary | ICD-10-CM

## 2021-10-24 DIAGNOSIS — J449 Chronic obstructive pulmonary disease, unspecified: Secondary | ICD-10-CM | POA: Diagnosis not present

## 2021-10-24 DIAGNOSIS — Z5189 Encounter for other specified aftercare: Secondary | ICD-10-CM | POA: Diagnosis not present

## 2021-10-24 DIAGNOSIS — G620 Drug-induced polyneuropathy: Secondary | ICD-10-CM | POA: Diagnosis not present

## 2021-10-24 LAB — CBC WITH DIFFERENTIAL/PLATELET
Abs Immature Granulocytes: 0.01 10*3/uL (ref 0.00–0.07)
Basophils Absolute: 0 10*3/uL (ref 0.0–0.1)
Basophils Relative: 1 %
Eosinophils Absolute: 0.2 10*3/uL (ref 0.0–0.5)
Eosinophils Relative: 6 %
HCT: 33.6 % — ABNORMAL LOW (ref 36.0–46.0)
Hemoglobin: 11.6 g/dL — ABNORMAL LOW (ref 12.0–15.0)
Immature Granulocytes: 0 %
Lymphocytes Relative: 22 %
Lymphs Abs: 0.8 10*3/uL (ref 0.7–4.0)
MCH: 30.9 pg (ref 26.0–34.0)
MCHC: 34.5 g/dL (ref 30.0–36.0)
MCV: 89.6 fL (ref 80.0–100.0)
Monocytes Absolute: 0.3 10*3/uL (ref 0.1–1.0)
Monocytes Relative: 8 %
Neutro Abs: 2.3 10*3/uL (ref 1.7–7.7)
Neutrophils Relative %: 63 %
Platelets: 263 10*3/uL (ref 150–400)
RBC: 3.75 MIL/uL — ABNORMAL LOW (ref 3.87–5.11)
RDW: 12.9 % (ref 11.5–15.5)
WBC: 3.6 10*3/uL — ABNORMAL LOW (ref 4.0–10.5)
nRBC: 0 % (ref 0.0–0.2)

## 2021-10-24 LAB — COMPREHENSIVE METABOLIC PANEL
ALT: 18 U/L (ref 0–44)
AST: 32 U/L (ref 15–41)
Albumin: 4.3 g/dL (ref 3.5–5.0)
Alkaline Phosphatase: 87 U/L (ref 38–126)
Anion gap: 7 (ref 5–15)
BUN: 10 mg/dL (ref 8–23)
CO2: 24 mmol/L (ref 22–32)
Calcium: 9.6 mg/dL (ref 8.9–10.3)
Chloride: 102 mmol/L (ref 98–111)
Creatinine, Ser: 0.67 mg/dL (ref 0.44–1.00)
GFR, Estimated: >60 mL/min (ref >60)
Glucose, Bld: 341 mg/dL — ABNORMAL HIGH (ref 70–99)
Potassium: 3.8 mmol/L (ref 3.5–5.1)
Sodium: 133 mmol/L — ABNORMAL LOW (ref 135–145)
Total Bilirubin: 0.5 mg/dL (ref 0.3–1.2)
Total Protein: 7.7 g/dL (ref 6.5–8.1)

## 2021-10-24 MED ORDER — INSULIN ASPART 100 UNIT/ML IJ SOLN
6.0000 [IU] | Freq: Once | INTRAMUSCULAR | Status: AC
  Administered 2021-10-24: 6 [IU] via SUBCUTANEOUS
  Filled 2021-10-24: qty 1

## 2021-10-24 MED ORDER — SODIUM CHLORIDE 0.9% FLUSH
10.0000 mL | INTRAVENOUS | Status: AC | PRN
  Administered 2021-10-24: 10 mL

## 2021-10-24 MED ORDER — HEPARIN SOD (PORK) LOCK FLUSH 100 UNIT/ML IV SOLN
500.0000 [IU] | Freq: Once | INTRAVENOUS | Status: AC | PRN
  Administered 2021-10-24: 500 [IU]

## 2021-10-24 MED ORDER — SODIUM CHLORIDE 0.9 % IV SOLN: Freq: Once | INTRAVENOUS | Status: AC

## 2021-10-24 MED ORDER — FAMOTIDINE IN NACL 20-0.9 MG/50ML-% IV SOLN
20.0000 mg | Freq: Once | INTRAVENOUS | Status: AC
  Administered 2021-10-24: 20 mg via INTRAVENOUS
  Filled 2021-10-24: qty 50

## 2021-10-24 MED ORDER — ACETAMINOPHEN 325 MG PO TABS
650.0000 mg | ORAL_TABLET | Freq: Once | ORAL | Status: AC
  Administered 2021-10-24: 650 mg via ORAL
  Filled 2021-10-24: qty 2

## 2021-10-24 MED ORDER — DIPHENHYDRAMINE HCL 50 MG/ML IJ SOLN
50.0000 mg | Freq: Once | INTRAMUSCULAR | Status: AC
  Administered 2021-10-24: 50 mg via INTRAVENOUS
  Filled 2021-10-24: qty 1

## 2021-10-24 MED ORDER — DEXAMETHASONE SODIUM PHOSPHATE 10 MG/ML IJ SOLN
5.0000 mg | Freq: Once | INTRAMUSCULAR | Status: AC
  Administered 2021-10-24: 5 mg via INTRAVENOUS
  Filled 2021-10-24: qty 1

## 2021-10-24 MED ORDER — SODIUM CHLORIDE 0.9% FLUSH
10.0000 mL | INTRAVENOUS | Status: DC | PRN
  Administered 2021-10-24: 10 mL

## 2021-10-24 MED ORDER — SODIUM CHLORIDE 0.9 % IV SOLN
150.0000 mg | Freq: Once | INTRAVENOUS | Status: AC
  Administered 2021-10-24: 150 mg via INTRAVENOUS
  Filled 2021-10-24: qty 150

## 2021-10-24 MED ORDER — PALONOSETRON HCL INJECTION 0.25 MG/5ML
0.2500 mg | Freq: Once | INTRAVENOUS | Status: AC
  Administered 2021-10-24: 0.25 mg via INTRAVENOUS
  Filled 2021-10-24: qty 5

## 2021-10-24 MED ORDER — SODIUM CHLORIDE 0.9 % IV SOLN
800.0000 mg | Freq: Once | INTRAVENOUS | Status: AC
  Administered 2021-10-24: 800 mg via INTRAVENOUS
  Filled 2021-10-24: qty 80

## 2021-10-24 NOTE — Patient Instructions (Signed)
[facility] MEDICAL [facility]COLOG[facility]  Discharge Instructions: Thank you for choosing [facility] to provide your oncology and hematology care.   If you have a lab appointment with the Cancer Center, please go directly to the Cancer Center and check in at the registration area.   Wear comfortable clothing and clothing appropriate for easy access to any Portacath or PICC line.   We strive to give you quality time with your provider. You may need to reschedule your appointment if you arrive late (15 or more minutes).  Arriving late affects you and other patients whose appointments are after yours.  Also, if you miss three or more appointments without notifying the office, you may be dismissed from the clinic at the provider's discretion.      For prescription refill requests, have your pharmacy contact our office and allow 72 hours for refills to be completed.    Today you received the following chemotherapy and/or immunotherapy agents: Trodelvy.       To help prevent nausea and vomiting after your treatment, we encourage you to take your nausea medication as directed.  BELOW ARE SYMPTOMS THAT SHOULD BE REPORTED IMMEDIATELY: *FEVER GREATER THAN 100.4 F (38 C) OR HIGHER *CHILLS OR SWEATING *NAUSEA AND VOMITING THAT IS NOT CONTROLLED WITH YOUR NAUSEA MEDICATION *UNUSUAL SHORTNESS OF BREATH *UNUSUAL BRUISING OR BLEEDING *URINARY PROBLEMS (pain or burning when urinating, or frequent urination) *BOWEL PROBLEMS (unusual diarrhea, constipation, pain near the anus) TENDERNESS IN MOUTH AND THROAT WITH OR WITHOUT PRESENCE OF ULCERS (sore throat, sores in mouth, or a toothache) UNUSUAL RASH, SWELLING OR PAIN  UNUSUAL VAGINAL DISCHARGE OR ITCHING   Items with * indicate a potential emergency and should be followed up as soon as possible or go to the Emergency Department if any problems should occur.  Please show the CHEMOTHERAPY ALERT CARD or IMMUNOTHERAPY ALERT CARD at check-in to  the Emergency Department and triage nurse.  Should you have questions after your visit or need to cancel or reschedule your appointment, please contact [facility]  Dept: [phone]  and follow the prompts.  Office hours are 8:00 a.m. to 4:30 p.m. Monday - Friday. Please note that voicemails left after 4:00 p.m. may not be returned until the following business day.  We are closed weekends and major holidays. You have access to a nurse at all times for urgent questions. Please call the main number to the clinic Dept: [phone] and follow the prompts.   For any non-urgent questions, you may also contact your provider using MyChart. We now offer e-Visits for anyone 18 and older to request care online for non-urgent symptoms. For details visit [email or web address].   Also download the MyChart app! Go to the app store, search "MyChart", open the app, select [facility], and log in with your MyChart username and password.  Due to Covid, a mask is required upon entering the hospital/clinic. If you do not have a mask, one will be given to you upon arrival. For doctor visits, patients may have 1 support person aged 18 or older with them. For treatment visits, patients cannot have anyone with them due to current Covid guidelines and our immunocompromised population.  

## 2021-10-24 NOTE — Progress Notes (Signed)
[place] Social Work Progress Notes ? ?Kristin Ward visited Support Services today requesting assistance filing financial aid applications, and met with social work Theatre manager. Pt completed [employer] application and SW intern will file it this week, upon receiving MD signature. Intern also gave pt [place], which pt will return to office upon completion.  ? ?Intern gave pt contact information for Cancer Care and [employer], neither of which has funds available at present, but intern encouraged pt to check periodically for availability. Intern will also follow up on availability of funds and inform patient if anything opens up. ? ?Kristin Ward acknowledges understanding of plan, and has Social Work contact information if anything arises in the meantime. ? ?[name], Social Work Intern ?Supervised by Gwinda Maine, LCSW ? ? ? ?

## 2021-10-25 ENCOUNTER — Inpatient Hospital Stay: Payer: Medicare Other

## 2021-10-25 ENCOUNTER — Telehealth: Payer: Self-pay | Admitting: Pharmacist

## 2021-10-25 VITALS — BP 115/75 | HR 89 | Temp 98.2°F | Resp 18

## 2021-10-25 DIAGNOSIS — G620 Drug-induced polyneuropathy: Secondary | ICD-10-CM | POA: Diagnosis not present

## 2021-10-25 DIAGNOSIS — Z5112 Encounter for antineoplastic immunotherapy: Secondary | ICD-10-CM | POA: Diagnosis not present

## 2021-10-25 DIAGNOSIS — I10 Essential (primary) hypertension: Secondary | ICD-10-CM | POA: Diagnosis not present

## 2021-10-25 DIAGNOSIS — C7951 Secondary malignant neoplasm of bone: Secondary | ICD-10-CM | POA: Diagnosis not present

## 2021-10-25 DIAGNOSIS — E1165 Type 2 diabetes mellitus with hyperglycemia: Secondary | ICD-10-CM | POA: Diagnosis not present

## 2021-10-25 DIAGNOSIS — E559 Vitamin D deficiency, unspecified: Secondary | ICD-10-CM | POA: Diagnosis not present

## 2021-10-25 DIAGNOSIS — Z170 Estrogen receptor positive status [ER+]: Secondary | ICD-10-CM | POA: Diagnosis not present

## 2021-10-25 DIAGNOSIS — Z794 Long term (current) use of insulin: Secondary | ICD-10-CM | POA: Diagnosis not present

## 2021-10-25 DIAGNOSIS — Z5189 Encounter for other specified aftercare: Secondary | ICD-10-CM | POA: Diagnosis not present

## 2021-10-25 DIAGNOSIS — J449 Chronic obstructive pulmonary disease, unspecified: Secondary | ICD-10-CM | POA: Diagnosis not present

## 2021-10-25 DIAGNOSIS — C50512 Malignant neoplasm of lower-outer quadrant of left female breast: Secondary | ICD-10-CM | POA: Diagnosis not present

## 2021-10-25 MED ORDER — PEGFILGRASTIM-BMEZ 6 MG/0.6ML ~~LOC~~ SOSY
6.0000 mg | PREFILLED_SYRINGE | Freq: Once | SUBCUTANEOUS | Status: AC
  Administered 2021-10-25: 6 mg via SUBCUTANEOUS
  Filled 2021-10-25: qty 0.6

## 2021-10-25 NOTE — Chronic Care Management (AMB) (Signed)
? ? ?Chronic Care Management ?Pharmacy Assistant  ? ?Name: Kristin Ward  MRN: [id number] DOB: [birth year] ? ?Reason for Encounter: Chart prep for initial encounter with Kristin Ward Clinical Pharmacist on 11/01/21 at 3 pm via phone call  ?  ?Conditions to be addressed/monitored: ?CHF, HTN, HLD, COPD, DMII, and Osteoarthritis ? ?Recent office visits:  ?10/12/21 Kristin Ward - Patient presented for essential hypertension and other concerns. Prescribed Insulin Glargine, Glulisine and Changed Metformin and Semaglutide. ? ?09/13/21 Kristin Ward - Patient presented for Routine medical exam and other concerns. Prescribed Fluconazole. Stopped Ergocalciferol and Hydralazine ? ?06/25/21 Kristin Lung, MD - Patient presented for Diabetes mellitus new onset and other concerns. Prescribed Atorvastatin Calcium ? ?Recent consult visits:  ?10/24/21 Patient presented for Infusion Goals of care. No medication changes. ? ?10/22/21 Kristin Ward ( Instride) - Patient presented for High risk foot care. No other visit details available. ? ?10/17/21  Patient presented for Infusion Goals of care. No medication changes. ? ?10/17/21 Kristin Merle, MD (Oncology) - Patient presented for Malignant neoplasm of lower outer quadrant of left breast of female. No medication changes. ? ?10/11/21 Patient presented for Infusion Goals of care. No medication changes. ? ?10/11/21 Patient presented for Infusion. No medication changes. ? ?09/28/21 Kristin Merle, MD (Oncology) - Patient presented for Malignant neoplasm of lower outer quadrant of left breast of female. No medication changes. ? ?09/14/21 Patient presented for Infusion Goals of care. No medication changes. ? ?09/14/21 Patient presented for Infusion. No medication changes. ? ?09/07/21 Patient presented for Infusion Goals of care. No medication changes. ? ?09/07/21 Kristin Merle, MD (Oncology) - Patient presented for Malignant neoplasm of lower outer quadrant of left breast of female. Prescribed  Pantoprazole. Stopped Diclofenac & Saccharomyces boulardii. ? ?09/07/21 Patient presented for Infusion. No medication changes. ? ?08/17/21 Kristin Merle, MD (Oncology) - Patient presented for Malignant neoplasm of lower outer quadrant of left breast of female, estrogen receptor negative and other concerns. Stopped Oseltamivir Phosphate. ? ?07/27/21 Kristin Merle, MD (Oncology) - Patient presented for Malignant neoplasm of lower outer quadrant of left breast of female, estrogen receptor negative and other concerns. Prescribed Oseltamivir Phosphate. ?Stopped Levofloxacin ? ?07/23/21 Kristin Ward - Patient presented for Type 2 diabetes and other concerns. No other visit details available. ? ?07/12/21 Kristin Merle, MD (Oncology) - Patient presented for Malignant neoplasm of lower outer quadrant of left breast of female, estrogen receptor negative and other concerns. No medication changes. ? ?06/13/21 Kristin Merle, MD (Oncology) - Patient presented for Malignant neoplasm of lower outer quadrant of left breast of female, estrogen receptor negative and other concerns. No medication changes. ? ?06/22/21 Kristin Merle, MD (Oncology) - Patient presented for Malignant neoplasm of lower outer quadrant of left breast of female, estrogen receptor negative and other concerns. Stopped Capecitabine  ? ?05/28/21 Kristin Merle, MD (Oncology) - Patient presented for Bone metastasis and other concerns. No medication changes. ? ?05/07/21 Kristin Merle, MD (Oncology) - Patient presented for Malignant neoplasm of lower outer quadrant of left breast of female, estrogen receptor negative. No medication changes. ? ?05/04/21 Kristin Ward - Patient presented for Tinea unguium and other concerns. No other visit details available. ? ?Hospital visits:  ?Medication Reconciliation was completed by comparing discharge summary, patient?s EMR and Pharmacy list, and upon discussion with patient. ?Patient [facility] ED [facility] 07/19[facility]21 due to Diarrhea, fever  and abdominal pain. Patient was present for 24 min. ? ?New?Medications Started at [facility] Discharge:?? ?-started  ?  none ? ?Medication Changes at Hospital Discharge: ?-Changed  ?none ? ?Medications Discontinued at Hospital Discharge: ?-Stopped  ?none ? ?Medications that remain the same after Hospital Discharge:??  ?-All other medications will remain the same.   ? ?Medications: ?Outpatient Encounter Medications as of 10/25/2021  ?Medication Sig  ? Accu-Chek FastClix Lancets MISC TEST TWICE A DAY. PT USES AN ACCU-CHEK GUIDE ME METER  ? aspirin 81 MG chewable tablet Chew 81 mg by mouth daily. (Patient not taking: Reported on 10/12/2021)  ? atorvastatin (LIPITOR) 20 MG tablet Take 1 tablet (20 mg total) by mouth daily.  ? Blood Glucose Calibration (ACCU-CHEK GUIDE CONTROL VI) In Vitro  ? brimonidine (ALPHAGAN) 0.2 % ophthalmic solution 1 drop 2 (two) times daily.  ? carvedilol (COREG) 12.5 MG tablet Take 1 tablet (12.5 mg total) by mouth 2 (two) times daily with a meal.  ? Cholecalciferol (D3-1000 PO) Take by mouth.  ? fluconazole (DIFLUCAN) 100 MG tablet Take 2 tablets (200 mg total) by mouth daily. Take 2 tablets by mouth now, may repeat in 7 days if needed  ? furosemide (LASIX) 20 MG tablet TAKE 2 TABLETS (40 MG TOTAL) BY MOUTH AS NEEDED. FOR WEIGHT GAIN 3 TO 4 LBS.  ? furosemide (LASIX) 40 MG tablet Take by mouth.  ? glucose blood (ACCU-CHEK GUIDE) test strip Use as instructed to test blood sugar 2 times a day  ? ibuprofen (ADVIL) 800 MG tablet Take 1 tablet (800 mg total) by mouth every 8 (eight) hours as needed.  ? insulin glargine (LANTUS SOLOSTAR) 100 UNIT/ML Solostar Pen Inject 10 Units into the skin at bedtime.  ? Lancets Misc. (ACCU-CHEK SOFTCLIX LANCET DEV) KIT 1 Units by Does not apply route 2 (two) times daily. Check FSBS BID - Include strips # 50 with 5 refills, Lancets #50 with 5 refills DX: Type 2 DM - ICD 10: E11.9  ? levofloxacin (LEVAQUIN) 500 MG tablet Take 500 mg by mouth daily.  ?  lidocaine-prilocaine (EMLA) cream Apply 1 application topically as needed.  ? linaclotide (LINZESS) 72 MCG capsule Take 1 capsule (72 mcg total) by mouth daily before breakfast. MUST HAVE APPOINTMENT FOR REFILLS  ? metFORMIN (GLUCOPHAGE) 1000 MG tablet Take 1 tablet (1,000 mg total) by mouth 2 (two) times daily with a meal.  ? morphine (MS CONTIN) 15 MG 12 hr tablet Oral (Patient not taking: Reported on 10/12/2021)  ? Multiple Vitamins-Minerals (WOMENS MULTI PO) Take by mouth.  ? ondansetron (ZOFRAN) 8 MG tablet Take 1 tablet (8 mg total) by mouth every 8 (eight) hours as needed for nausea or vomiting (begin on day 3 after chemo).  ? oxyCODONE (OXY IR/ROXICODONE) 5 MG immediate release tablet Oral  ? oxyCODONE (OXYCONTIN) 10 mg 12 hr tablet Oral  ? oxyCODONE-acetaminophen (PERCOCET/ROXICET) 5-325 MG tablet Oral  ? pantoprazole (PROTONIX) 40 MG tablet TAKE 1 TABLET BY MOUTH EVERY DAY  ? prochlorperazine (COMPAZINE) 10 MG tablet Take 1 tablet (10 mg total) by mouth every 6 (six) hours as needed (Nausea or vomiting). (Patient not taking: Reported on 09/13/2021)  ? sacubitril-valsartan (ENTRESTO) 97-103 MG Take 1 tablet by mouth 2 (two) times daily.  ? Semaglutide (RYBELSUS) 7 MG TABS Take 7 mg by mouth daily.  ? spironolactone (ALDACTONE) 25 MG tablet Take 1 tablet (25 mg total) by mouth daily. (Patient not taking: Reported on 10/12/2021)  ? valACYclovir (VALTREX) 1000 MG tablet   ? vitamin B-12 (CYANOCOBALAMIN) 500 MCG tablet Take 500 mcg by mouth daily.  ? vitamin C (ASCORBIC ACID)  500 MG tablet Take 1,000 mg 2 (two) times daily by mouth.  ? ?Facility-Administered Encounter Medications as of 10/25/2021  ?Medication  ? insulin glulisine (APIDRA) injection 6 Units  ?Fill History : ?ACCU-CHEK SOFTCLIX LANCETS 10/12/2021 30  ? ?ATORVASTATIN 20 MG TABLET 09/21/2021 90  ? ?BRIMONIDINE 0.2% EYE DROP 09/19/2021 75  ? ?CARVEDILOL 12.5 MG TABLET 09/13/2021 90  ? ?FLUCONAZOLE 100 MG TABLET 10/12/2021 7  ? ?FUROSEMIDE 20 MG TABLET  09/21/2021 90  ? ?furosemide 40 mg tablet 09/19/2021 1  ? ?ACCU-CHEK GUIDE TEST STRIP 03/20/2021 90  ? ?LEVOFLOXACIN 500 MG TABLET 07/27/2021 10  ? ?LIDOCAINE-PRILOCAINE CREAM 10/23/2020 15  ? ?LINZESS 72 MCG CAPSULE 11/15/

## 2021-10-26 ENCOUNTER — Encounter: Payer: Self-pay | Admitting: Hematology

## 2021-11-01 ENCOUNTER — Ambulatory Visit (INDEPENDENT_AMBULATORY_CARE_PROVIDER_SITE_OTHER): Payer: Medicare Other | Admitting: Pharmacist

## 2021-11-01 DIAGNOSIS — E1165 Type 2 diabetes mellitus with hyperglycemia: Secondary | ICD-10-CM

## 2021-11-01 DIAGNOSIS — I10 Essential (primary) hypertension: Secondary | ICD-10-CM

## 2021-11-01 NOTE — Patient Instructions (Signed)
Hi Kristin Ward, ? ?It was great to get to meet you over the telephone! Below is a summary of some of the topics we discussed.  ? ?Please go ahead and restart taking the aspirin 81 mg daily and try to back down on your vitamin C to 1000 mg per day. Also, don't forget to bring your blood pressure cuff to your next office visit to make sure it's accurate. ? ?Please reach out to me if you have any questions or need anything! ? ?Best, ?Maddie ? ?[name], PharmD, BCACP ?Clinical Pharmacist ?[facility] ?[phone] ? ? Visit Information ? ? Goals Addressed   ?None ?  ? ?Patient Care Plan: [place]  ?  ? ?Problem Identified: Problem: Hypertension, Hyperlipidemia, Diabetes, Heart Failure, and GERD   ?  ? ?Long-Range Goal: Patient-Specific Goal   ?Start Date: 11/01/2021  ?Expected End Date: 11/02/2022  ?This Visit's Progress: On track  ?Priority: High  ?Note:   ?Current Barriers:  ?Unable to independently monitor therapeutic efficacy ?Unable to achieve control of diabetes  ?Unable to maintain control of cholesterol ?Unable to self administer medications as prescribed ? ?Pharmacist Clinical Goal(s):  ?Patient will achieve adherence to monitoring guidelines and medication adherence to achieve therapeutic efficacy ?achieve control of diabetes as evidenced by A1c ?maintain control of cholesterol as evidenced by next lipid panel  through collaboration with PharmD and provider.  ? ?Interventions: ?1:1 collaboration with Irene Pap, PA-C regarding development and update of comprehensive plan of care as evidenced by provider attestation and co-signature ?Inter-disciplinary care team collaboration (see longitudinal plan of care) ?Comprehensive medication review performed; medication list updated in electronic medical record ? ?Hypertension (BP goal <130/80) ?-Not ideally controlled ?-Current treatment: ?Carvedilol 12.5 mg 1 tablet twice daily - Appropriate, Effective, Safe,  Accessible ?Spironolactone 25 mg 1 tablet daily - not taking ?Entresto 97-103 mg 1 tablet twice daily - Appropriate, Effective, Safe, Accessible ?-Medications previously tried: unknown  ?-Current home readings: 102-130/70-80s (checking with arm cuff 1-2 times a day) ?-Current dietary habits: limits salt intake - was having low sodium ?-Current exercise habits: not able to right now ?-Denies hypotensive/hypertensive symptoms ?-Educated on BP goals and benefits of medications for prevention of heart attack, stroke and kidney damage; ?Daily salt intake goal < 2300 mg; ?Importance of home blood pressure monitoring; ?Proper BP monitoring technique; ?Symptoms of hypotension and importance of maintaining adequate hydration; ?-Counseled to monitor BP at home at least weekly, document, and provide log at future appointments ?-Counseled on diet and exercise extensively ?Recommended to continue current medication ?Recommended bringing BP cuff to next office visit. ? ?Hyperlipidemia: (LDL goal < 70) ?-Uncontrolled ?-Current treatment: ?Atorvastatin 20 mg 1 tablet daily - Appropriate, Query effective, Safe, Accessible ?-Medications previously tried: none  ?-Current dietary patterns: doesn't eat fried foods; broiled foods; avocado and olive oil ?-Current exercise habits: none ?-Educated on Cholesterol goals;  ?Benefits of statin for ASCVD risk reduction; ?Importance of limiting foods high in cholesterol; ?-Recommended repeat lipid panel and consider increasing to 40 mg. ? ?Diabetes (A1c goal <7%) ?-Uncontrolled ?-Current medications: ?Rybelsus 7 mg 1 tablet daily - Appropriate, Query effective, Safe, Accessible ?Lantus inject 10 units daily - Appropriate, Query effective, Safe, Accessible ?Metformin 1000 mg 1 tablet twice daily - Appropriate, Query effective, Safe, Accessible ?-Medications previously tried: unknown  ?-Current home glucose readings ?fasting glucose: 200-300 units (before eating) ?post prandial glucose:  none ?-Denies hypoglycemic/hyperglycemic symptoms ?-Current meal patterns: sometimes 2 meals ?breakfast: skips   ?lunch: collard greens, tomato stew ?  dinner: not eating much meat, sometimes lamb chop ?snacks: graham crackers, apple or peach or pear ?drinks: water, hot tea, ginger ale (two cups a day)  ?-Current exercise: none right now ?-Educated on A1c and blood sugar goals; ?Benefits of routine self-monitoring of blood sugar; ?Continuous glucose monitoring; ?Carbohydrate counting and/or plate method ?-Counseled to check feet daily and get yearly eye exams ?-Counseled on diet and exercise extensively ?Recommended to continue current medication ?Recommended Dexcom for continous glucose monitoring. ? ?Heart Failure (Goal: manage symptoms and prevent exacerbations) ?-Controlled ?-Last ejection fraction: 45-50% (Date: 08/26/17) ?-HF type: Diastolic ?-NYHA Class: II (slight limitation of activity) ?-AHA HF Stage: C (Heart disease and symptoms present) ?-Current treatment: ?Carvedilol 12.5 mg 1 tablet twice daily - Appropriate, Effective, Safe, Accessible ?Spironolactone 25 mg 1 tablet daily - not taking ?Entresto 97-103 mg 1 tablet twice daily - Appropriate, Effective, Safe, Accessible ?Furosemide 20 mg 2 tablets daily as needed - Appropriate, Effective, Safe, Accessible ?-Medications previously tried: n/a  ?-Current home BP/HR readings: refer to above ?-Current dietary habits: uses celtic salt now; uses a teaspoon of salt  ?-Current exercise habits: not able to right now ?-Educated on Importance of weighing daily; if you gain more than 3 pounds in one day or 5 pounds in one week, call cardiologist. ?Proper diuretic administration and potassium supplementation ?-Recommended to continue current medication ?Recommended discussing with Dr. Ellyn Hack about restarting spironolactone. ? ?GERD (Goal: minimize symptoms) ?-Controlled ?-Current treatment  ?Pantoprazole 40 mg 1 tablet daily - PRN Appropriate, Effective, Safe,  Accessible ?-Medications previously tried: none  ?-Recommended to continue current medication using it sparingly. ? ?Pain (Goal: minimize pain) ?-Controlled ?-Current treatment  ?Ibuprofen 800 mg 1 tablet every 8 hours as needed - Appropriate, Effective, Query Safe, Accessible ?Oxycodone 5 mg tablet as needed - Appropriate, Effective, Safe, Accessible ?-Medications previously tried: morphine (allergy)  ?-Recommended on limiting NSAID use with use of aspirin and fluid retention. ? ?Health Maintenance ?-Vaccine gaps: tetanus, shingrix, COVID, influenza ?-Current therapy:  ?Aspirin 81 mg 1 tablet daily - stopped on her own ?Beet root a few times a week ?Ginger a few times a week ?Turmeric a few times a week ?Brimonidine 0.2% 1 drop twice daily ?Vitamin B12 1000 mcg 1 tablet daily ?Vitamin C 500 mg 1 tablet twice daily ?Prochlorperazine 10 mg as needed ?Linzess 72 mcg 1 capsule daily as needed ?Emla cream apply to port as needed ?Multivitamin 1 tablet daily ?Vitamin D 1000 units 2 tablets daily ?Ondansetron 4 mg 1 tablet as needed ?-Educated on Cost vs benefit of each product must be carefully weighed by individual consumer ?Supplements may interfere with prescription drugs ?-Patient is satisfied with current therapy and denies issues ?-Recommended decreasing vitamin C to no more than 1000 mg per day. ? ?Patient Goals/Self-Care Activities ?Patient will:  ?- take medications as prescribed as evidenced by patient report and record review ?check glucose daily, document, and provide at future appointments ?check blood pressure a few times a week, document, and provide at future appointments ? ?Follow Up Plan: The care management team will reach out to the patient again over the next 14 days.  ? ?  ? ? ?Ms. Perezperez was given information about Chronic Care Management services today including:  ?CCM service includes personalized support from designated clinical staff supervised by her physician, including individualized plan of  care and coordination with other care providers ?24/7 contact phone numbers for assistance for urgent and routine care needs. ?Standard insurance, coinsurance, copays and deductibles apply for chronic  care management only during months in w

## 2021-11-01 NOTE — Progress Notes (Signed)
? ?Chronic Care Management ?Pharmacy Note ? ?11/01/2021 ?Name:  Kristin Ward MRN:  [id number] DOB:  09-14-1955 ? ?Summary: ?A1c not at goal < 7% ?LDL not at goal < 70 ? ?Recommendations/Changes made from today's visit: ?-Recommend repeat lipid panel and consider increasing atorvastatin to 40 mg daily ?-Recommended restarting aspirin 81 mg daily ?-Recommended bringing BP cuff to next office visit to ensure accuracy ?-Recommended Dexcom for continous glucose monitoring based on insurance ?-Consider switching Rybelsus to Ozempic due to further A1c lowering ?-Recommended taking Rybelsus 30 minutes prior to a meal ? ?Plan: ?DM follow up in 1-2 months ? ?Subjective: ?Kristin Ward is an 66 y.o. year old female who is a primary patient of [name].  The CCM team was consulted for assistance with disease management and care coordination needs.   ? ?Engaged with patient by telephone for initial visit in response to provider referral for pharmacy case management and/or care coordination services.  ? ?Consent to Services:  ?The patient was given the following information about Chronic Care Management services today, agreed to services, and gave verbal consent: 1. CCM service includes personalized support from designated clinical staff supervised by the primary care provider, including individualized plan of care and coordination with other care providers 2. 24/7 contact phone numbers for assistance for urgent and routine care needs. 3. Service will only be billed when office clinical staff spend 20 minutes or more in a month to coordinate care. 4. Only one practitioner may furnish and bill the service in a calendar month. 5.The patient may stop CCM services at any time (effective at the end of the month) by phone call to the office staff. 6. The patient will be responsible for cost sharing (co-pay) of up to 20% of the service fee (after annual deductible is met). Patient agreed to services and  consent obtained. ? ?Patient Care Team: ?[name] as PCP - General (Physician Assistant) ?[name], MD as PCP - Cardiology (Cardiology) ?[name], MD as Consulting Physician (Hematology) ?[name], MD as Consulting Physician (General Surgery) ?[name], [facility] as Pharmacist (Pharmacist) ? ?Recent office visits: ?10/12/21 [name] - Patient presented for essential hypertension and other concerns. Prescribed Insulin Glargine, Glulisine and Changed Metformin and Semaglutide. ?  ?09/13/21 [name] - Patient presented for Routine medical exam and other concerns. Prescribed Fluconazole. Stopped Ergocalciferol and Hydralazine ?  ?06/25/21 [name], MD - Patient presented for Diabetes mellitus new onset and other concerns. Prescribed Atorvastatin Calcium. ? ?Recent consult visits: ?10/24/21 Patient presented for Infusion Goals of care. No medication changes. ?  ?10/22/21 [name] ( [place]) - Patient presented for High risk foot care. No other visit details available. ?  ?10/17/21  Patient presented for Infusion Goals of care. No medication changes. ?  ?10/17/21 [name], MD (Oncology) - Patient presented for Malignant neoplasm of lower outer quadrant of left breast of female. No medication changes. ?  ?10/11/21 Patient presented for Infusion Goals of care. No medication changes. ?  ?10/11/21 Patient presented for Infusion. No medication changes. ?  ?09/28/21 [name], MD (Oncology) - Patient presented for Malignant neoplasm of lower outer quadrant of left breast of female. No medication changes. ?  ?09/14/21 Patient presented for Infusion Goals of care. No medication changes. ?  ?09/14/21 Patient presented for Infusion. No medication changes. ?  ?09/07/21 Patient presented for Infusion Goals of care. No medication changes. ?  ?09/07/21 [name], MD (  Oncology) - Patient presented for Malignant neoplasm of lower outer quadrant of left breast of female. Prescribed  Pantoprazole. Stopped Diclofenac & Saccharomyces boulardii. ?  ?09/07/21 Patient presented for Infusion. No medication changes. ?  ?08/17/21 [name], MD (Oncology) - Patient presented for Malignant neoplasm of lower outer quadrant of left breast of female, estrogen receptor negative and other concerns. Stopped Oseltamivir Phosphate. ?  ?07/27/21 [name], MD (Oncology) - Patient presented for Malignant neoplasm of lower outer quadrant of left breast of female, estrogen receptor negative and other concerns. Prescribed Oseltamivir Phosphate. ?Stopped Levofloxacin ?  ?07/23/21 [name] ?  ?07/12/21 [name], MD (Oncology) - Patient presented for Malignant neoplasm of lower outer quadrant of left breast of female, estrogen receptor negative and other concerns. No medication changes. ?  ?06/13/21 [name], MD (Oncology) - Patient presented for Malignant neoplasm of lower outer quadrant of left breast of female, estrogen receptor negative and other concerns. No medication changes. ?  ?06/22/21 [name], MD (Oncology) - Patient presented for Malignant neoplasm of lower outer quadrant of left breast of female, estrogen receptor negative and other concerns. Stopped Capecitabine  ?  ?05/28/21 [name], MD (Oncology) - Patient presented for Bone metastasis and other concerns. No medication changes. ?  ?05/07/21 [name], MD (Oncology) - Patient presented for Malignant neoplasm of lower outer quadrant of left breast of female, estrogen receptor negative. No medication changes. ?  ?05/04/21 [name] - Patient presented for Tinea unguium and other concerns. No other visit details available. ? ?Hospital visits: ?Medication Reconciliation was completed by comparing discharge summary, patient?s EMR and Pharmacy list, and upon discussion with patient. ?Patient presented to [facility] ED on 07/19/21 due to  Diarrhea, fever and abdominal pain. Patient was present for 24 min. ?  ?New?Medications Started at [facility] Discharge:?? ?-started  ?none ?  ?Medication Changes at Hospital Discharge: ?-Changed  ?none ?  ?Medications Discontinued at Hospital Discharge: ?-Stopped  ?none ?  ?Medications that remain the same after Hospital Discharge:??  ?-All other medications will remain the same.   ? ? ?Objective: ? ?Lab Results  ?Component Value Date  ? CREATININE 0.67 10/24/2021  ? BUN 10 10/24/2021  ? GFR 80.41 06/23/2020  ? EGFR >60 08/01/2017  ? GFRNONAA >60 10/24/2021  ? GFRAA >60 02/16/2020  ? NA 133 (L) 10/24/2021  ? K 3.8 10/24/2021  ? CALCIUM 9.6 10/24/2021  ? CO2 24 10/24/2021  ? GLUCOSE 341 (H) 10/24/2021  ? ? ?Lab Results  ?Component Value Date/Time  ? HGBA1C 12.0 (A) 10/12/2021 10:11 AM  ? HGBA1C 6.8 (H) 07/12/2021 11:42 AM  ? HGBA1C 8.6 (H) 04/17/2021 10:39 AM  ? GFR 80.41 06/23/2020 10:04 AM  ?  ?Last diabetic Eye exam:  ?Lab Results  ?Component Value Date/Time  ? HMDIABEYEEXA No Retinopathy 09/19/2021 12:00 AM  ?  ?Last diabetic Foot exam: No results found for: HMDIABFOOTEX  ? ?Lab Results  ?Component Value Date  ? CHOL 206 (H) 03/08/2021  ? HDL 46 03/08/2021  ? LDLCALC 127 (H) 03/08/2021  ? TRIG 186 (H) 03/08/2021  ? CHOLHDL 4.5 (H) 03/08/2021  ? ? ? ?  Latest Ref Rng & Units 10/24/2021  ?  9:54 AM 10/17/2021  ? 11:10 AM 10/11/2021  ?  8:30 AM  ?Hepatic Function  ?Total Protein 6.5 - 8.1 g/dL 7.7   7.1   7.2    ?Albumin 3.5 -  5.0 g/dL 4.3   4.2   4.1    ?AST 15 - 41 U/L 32   16   13    ?ALT 0 - 44 U/L _0 ?Alk Phosphatase 38 - 126 U/L 87   83   89    ?Total Bilirubin 0.3 - 1.2 mg/dL 0.5   0.6   0.5    ? ? ?Lab Results  ?Component Value Date/Time  ? TSH 3.71 06/23/2020 10:04 AM  ? TSH 3.920 12/17/2018 09:00 AM  ? FREET4 1.39 12/17/2018 09:00 AM  ? ? ? ?  Latest Ref Rng & Units 10/24/2021  ?  9:54 AM 10/17/2021  ? 11:10 AM 10/11/2021  ?  8:30 AM  ?CBC  ?WBC 4.0 - 10.5 K/uL 3.6   3.9   4.9    ?Hemoglobin 12.0 - 15.0  g/dL 11.6   11.1   10.9    ?Hematocrit 36.0 - 46.0 % 33.6   33.0   32.8    ?Platelets 150 - 400 K/uL 263   249   278    ? ? ?Lab Results  ?Component Value Date/Time  ? VD25OH 20.86 (L) 08/17/2021 09:31 AM  ? [id number]

## 2021-11-02 ENCOUNTER — Ambulatory Visit: Payer: Medicare Other | Admitting: Podiatry

## 2021-11-03 ENCOUNTER — Other Ambulatory Visit: Payer: Self-pay | Admitting: Hematology

## 2021-11-08 ENCOUNTER — Encounter: Payer: Self-pay | Admitting: Hematology

## 2021-11-08 ENCOUNTER — Other Ambulatory Visit: Payer: Self-pay

## 2021-11-08 ENCOUNTER — Inpatient Hospital Stay: Payer: Medicare Other

## 2021-11-08 ENCOUNTER — Inpatient Hospital Stay (HOSPITAL_BASED_OUTPATIENT_CLINIC_OR_DEPARTMENT_OTHER): Payer: Medicare Other | Admitting: Hematology

## 2021-11-08 VITALS — BP 139/89

## 2021-11-08 VITALS — BP 151/93 | HR 78 | Temp 98.6°F | Resp 18 | Ht 67.0 in | Wt 211.2 lb

## 2021-11-08 DIAGNOSIS — Z5189 Encounter for other specified aftercare: Secondary | ICD-10-CM | POA: Diagnosis not present

## 2021-11-08 DIAGNOSIS — G620 Drug-induced polyneuropathy: Secondary | ICD-10-CM | POA: Diagnosis not present

## 2021-11-08 DIAGNOSIS — Z794 Long term (current) use of insulin: Secondary | ICD-10-CM | POA: Diagnosis not present

## 2021-11-08 DIAGNOSIS — Z171 Estrogen receptor negative status [ER-]: Secondary | ICD-10-CM

## 2021-11-08 DIAGNOSIS — C50512 Malignant neoplasm of lower-outer quadrant of left female breast: Secondary | ICD-10-CM

## 2021-11-08 DIAGNOSIS — E559 Vitamin D deficiency, unspecified: Secondary | ICD-10-CM | POA: Diagnosis not present

## 2021-11-08 DIAGNOSIS — C7951 Secondary malignant neoplasm of bone: Secondary | ICD-10-CM

## 2021-11-08 DIAGNOSIS — E1165 Type 2 diabetes mellitus with hyperglycemia: Secondary | ICD-10-CM | POA: Diagnosis not present

## 2021-11-08 DIAGNOSIS — I10 Essential (primary) hypertension: Secondary | ICD-10-CM | POA: Diagnosis not present

## 2021-11-08 DIAGNOSIS — Z95828 Presence of other vascular implants and grafts: Secondary | ICD-10-CM

## 2021-11-08 DIAGNOSIS — Z170 Estrogen receptor positive status [ER+]: Secondary | ICD-10-CM | POA: Diagnosis not present

## 2021-11-08 DIAGNOSIS — Z5112 Encounter for antineoplastic immunotherapy: Secondary | ICD-10-CM | POA: Diagnosis not present

## 2021-11-08 DIAGNOSIS — J449 Chronic obstructive pulmonary disease, unspecified: Secondary | ICD-10-CM | POA: Diagnosis not present

## 2021-11-08 DIAGNOSIS — Z7189 Other specified counseling: Secondary | ICD-10-CM

## 2021-11-08 LAB — COMPREHENSIVE METABOLIC PANEL
ALT: 11 U/L (ref 0–44)
AST: 10 U/L — ABNORMAL LOW (ref 15–41)
Albumin: 3.6 g/dL (ref 3.5–5.0)
Alkaline Phosphatase: 106 U/L (ref 38–126)
Anion gap: 5 (ref 5–15)
BUN: 6 mg/dL — ABNORMAL LOW (ref 8–23)
CO2: 29 mmol/L (ref 22–32)
Calcium: 9.3 mg/dL (ref 8.9–10.3)
Chloride: 106 mmol/L (ref 98–111)
Creatinine, Ser: 0.58 mg/dL (ref 0.44–1.00)
GFR, Estimated: >60 mL/min (ref >60)
Glucose, Bld: 421 mg/dL — ABNORMAL HIGH (ref 70–99)
Potassium: 4.1 mmol/L (ref 3.5–5.1)
Sodium: 140 mmol/L (ref 135–145)
Total Bilirubin: 0.4 mg/dL (ref 0.3–1.2)
Total Protein: 6.9 g/dL (ref 6.5–8.1)

## 2021-11-08 LAB — CBC WITH DIFFERENTIAL/PLATELET
Abs Immature Granulocytes: 0.03 10*3/uL (ref 0.00–0.07)
Basophils Absolute: 0 10*3/uL (ref 0.0–0.1)
Basophils Relative: 0 %
Eosinophils Absolute: 0.2 10*3/uL (ref 0.0–0.5)
Eosinophils Relative: 4 %
HCT: 32.4 % — ABNORMAL LOW (ref 36.0–46.0)
Hemoglobin: 11 g/dL — ABNORMAL LOW (ref 12.0–15.0)
Immature Granulocytes: 1 %
Lymphocytes Relative: 15 %
Lymphs Abs: 0.8 10*3/uL (ref 0.7–4.0)
MCH: 30.8 pg (ref 26.0–34.0)
MCHC: 34 g/dL (ref 30.0–36.0)
MCV: 90.8 fL (ref 80.0–100.0)
Monocytes Absolute: 0.4 10*3/uL (ref 0.1–1.0)
Monocytes Relative: 8 %
Neutro Abs: 3.7 10*3/uL (ref 1.7–7.7)
Neutrophils Relative %: 72 %
Platelets: 367 10*3/uL (ref 150–400)
RBC: 3.57 MIL/uL — ABNORMAL LOW (ref 3.87–5.11)
RDW: 13 % (ref 11.5–15.5)
WBC: 5.2 10*3/uL (ref 4.0–10.5)
nRBC: 0 % (ref 0.0–0.2)

## 2021-11-08 MED ORDER — HEPARIN SOD (PORK) LOCK FLUSH 100 UNIT/ML IV SOLN
500.0000 [IU] | Freq: Once | INTRAVENOUS | Status: AC | PRN
  Administered 2021-11-08: 500 [IU]

## 2021-11-08 MED ORDER — ZOLEDRONIC ACID 4 MG/100ML IV SOLN
4.0000 mg | Freq: Once | INTRAVENOUS | Status: AC
  Administered 2021-11-08: 4 mg via INTRAVENOUS
  Filled 2021-11-08: qty 100

## 2021-11-08 MED ORDER — SODIUM CHLORIDE 0.9% FLUSH
10.0000 mL | INTRAVENOUS | Status: DC | PRN
  Administered 2021-11-08: 10 mL via INTRAVENOUS

## 2021-11-08 MED ORDER — SODIUM CHLORIDE 0.9 % IV SOLN
800.0000 mg | Freq: Once | INTRAVENOUS | Status: AC
  Administered 2021-11-08: 800 mg via INTRAVENOUS
  Filled 2021-11-08: qty 80

## 2021-11-08 MED ORDER — INSULIN ASPART 100 UNIT/ML IJ SOLN
4.0000 [IU] | Freq: Once | INTRAMUSCULAR | Status: AC
  Administered 2021-11-08: 4 [IU] via SUBCUTANEOUS
  Filled 2021-11-08: qty 1

## 2021-11-08 MED ORDER — SODIUM CHLORIDE 0.9% FLUSH
10.0000 mL | INTRAVENOUS | Status: DC | PRN
  Administered 2021-11-08: 10 mL

## 2021-11-08 MED ORDER — PALONOSETRON HCL INJECTION 0.25 MG/5ML
0.2500 mg | Freq: Once | INTRAVENOUS | Status: AC
  Administered 2021-11-08: 0.25 mg via INTRAVENOUS
  Filled 2021-11-08: qty 5

## 2021-11-08 MED ORDER — SODIUM CHLORIDE 0.9 % IV SOLN: Freq: Once | INTRAVENOUS | Status: AC

## 2021-11-08 MED ORDER — DEXAMETHASONE SODIUM PHOSPHATE 10 MG/ML IJ SOLN
5.0000 mg | Freq: Once | INTRAMUSCULAR | Status: AC
  Administered 2021-11-08: 5 mg via INTRAVENOUS
  Filled 2021-11-08: qty 1

## 2021-11-08 MED ORDER — ACETAMINOPHEN 325 MG PO TABS
650.0000 mg | ORAL_TABLET | Freq: Once | ORAL | Status: AC
  Administered 2021-11-08: 650 mg via ORAL
  Filled 2021-11-08: qty 2

## 2021-11-08 MED ORDER — SODIUM CHLORIDE 0.9 % IV SOLN
150.0000 mg | Freq: Once | INTRAVENOUS | Status: AC
  Administered 2021-11-08: 150 mg via INTRAVENOUS
  Filled 2021-11-08: qty 150

## 2021-11-08 MED ORDER — FAMOTIDINE IN NACL 20-0.9 MG/50ML-% IV SOLN
20.0000 mg | Freq: Once | INTRAVENOUS | Status: AC
  Administered 2021-11-08: 20 mg via INTRAVENOUS
  Filled 2021-11-08: qty 50

## 2021-11-08 MED ORDER — DIPHENHYDRAMINE HCL 50 MG/ML IJ SOLN
50.0000 mg | Freq: Once | INTRAMUSCULAR | Status: AC
  Administered 2021-11-08: 50 mg via INTRAVENOUS
  Filled 2021-11-08: qty 1

## 2021-11-08 NOTE — Progress Notes (Signed)
?[name]   ?Telephone:([phone]) [phone] [phone]   ?Clinic Follow up Note  ? ?Patient Care Team: ?[name] as PCP - General (Physician Assistant) ?[name], MD as PCP - Cardiology (Cardiology) ?[name], MD as Consulting Physician (Hematology) ?[name], MD as Consulting Physician (General Surgery) ?[name], [facility] as Pharmacist (Pharmacist) ? ?Date of Service:  11/08/2021 ? ?CHIEF COMPLAINT: f/u of metastatic breast cancer ? ?CURRENT THERAPY:  ?Kristin Ward, given on days 1 and 8 every 21 day cycle, starting 07/03/21 ? ?ASSESSMENT & PLAN:  ?Kristin Ward is a 66 y.o. female with  ? ?1. Left breast invasive ductal carcinoma, stage IB, p(T1cN0M0), Triple negative, Grade 3, in 2018, metastatic disease in 08/2020 ER 20% weakly + PR/HER2 (0) negative, PD-L1 0%, genetics (-) ?-diagnosed with triple negative breast cancer in 05/2017. She is s/p left breast lumpectomy, adjuvant chemo TC and Radiation.  ?-Genetic testing was negative for pathogenetic mutations.  ?-Due to new right gluteal pain, she underwent biopsy on 09/02/20 which showed metastatic carcinoma, consistent with breast primary. ER 20% positive, PR and HER2 negative.  ?-she received 4 cycles of Taxol 2/15-12/18/20, discontinued due to worsening neuropathy. She then took Xeloda from 01/01/21-06/2021. ?-she switched to Allgood on 07/03/21 due to disease progression. She again experienced nausea as well as severe diarrhea after cycle 2, but was able to recover well. I reduced her dose from C4. We will also discontinue dexa. ?-restaging CT CAP and bone scan on 09/27/21 showed improvement to lymphadenopathy and stable osseous metastatic disease. ?-she continues on trodlevy. Chemo has been held in Feb 2023 due to her uncontrolled hyperglycemia. She has again developed more right-sided groin lymph nodes, palpable on exam today. I will order repeat CT AP to be done in the next 2-3 weeks. ?-Labs reviewed.  Despite BG of 421 today, we will proceed with trodelvy. We will again give her insulin prior to treatment. ?  ?2. Diabetes and uncontrolled hyperglycemia ?-worsening hyperglycemia lately, probably related to dexa (before chemo) and Kristin Ward  ?-she saw her PCP on 10/12/21, who added insulin and increased her metformin and semaglutide. ?-she tells me is scheduled to see endocrinology next week. ?  ?3. Right hip pain, Right iliac bone met, Vit D deficiency ?-She had radiating right hip pain since 2020 from right iliac wing mass ?-She completed Palliative radiation to right iliac 09/04/20 - 09/22/20 with Dr. Lisbeth Renshaw, and her pain is much improved.  ?-Given localized bone met, she started Zometa q49month on 11/20/20. Most recent dose 08/03/21 ?-vit D has remained low, most recently 20.86 on 08/17/21. ?  ?4. G2 peripheral Neuropathy  ?-S/p C2D8 Taxol she developed mild tingling in her hands and feet.  ?-She uses ice bags at home as needed.  ?-She continues to have moderate numbness in her hands and light tingling in her feet. She can take B12 or acupuncture.  ?-Taxol stopped after 12/18/20. Her neuropathy is improving.  ?-Overall stable ?  ?5. DM, HTN, COPD, retinitis pigmentosa with limited vision, Chronic combined systolic and diastolic heart failure, EF 45-50% ?-Continue to follow-up with PCP Dr LRedmond Schooland cardiologist Dr HEllyn Hack ?-she is legally blind ?-She recently tried insulin, but it made her sick, and she has stopped. She is on oral diabetic medicine and checking her sugars at home.  ?  ?6. Goal of care discussion  ?-The patient understands the goal of care is palliative. ?-she is full code now  ?  ?  ?Plan ?-proceed  with Kristin Ward today ?            -will give insulin 4u given BG of 421 today ?-lab, flush, and Trodelvy in 1, 3, and 4 weeks ?-f/u in 3 weeks ?-I spoke with her PCP, and will increase her Lantus from 10u to 15u HS, I informed pt  ? ? ?No problem-specific Assessment & Plan notes found for this  encounter. ? ? ?SUMMARY OF ONCOLOGIC HISTORY: ?Oncology History Overview Note  ?Cancer Staging ?Malignant neoplasm of lower-outer quadrant of left breast of female, estrogen receptor negative ([place]) ?Staging form: Breast, AJCC 8th Edition ?- Clinical stage from 06/06/2017: Stage IB (cT1c, cN0, cM0, G3, ER-, PR-, HER2-) - Signed by [name], MD on 06/15/2017 ?Nuclear grade: G3 ?Histologic grading system: 3 grade system ?- Pathologic stage from 06/26/2017: Stage IB (pT1c, pN0, cM0, G3, ER-, PR-, HER2-) - Signed by [name], MD on 07/10/2017 ?Neoadjuvant therapy: No ?Nuclear grade: G3 ?Multigene prognostic tests performed: None ?Histologic grading system: 3 grade system ?Laterality: Left ? ? ?  ?Malignant neoplasm of lower-outer quadrant of left breast of female, estrogen receptor negative ([place])  ?05/30/2017 Mammogram  ? Diagnostic mammo and [place] ?IMPRESSION: ?1. Highly suspicious 1.4 cm mass in the slightly lower slightly ?outer left breast -tissue sampling recommended. ?2. Indeterminate 0.5 mm mass in the slightly lower slightly outer ?left breast -tissue sampling recommended. ?3. At least 2 left axillary lymph nodes with borderline cortical ?thickness. ?  ?06/06/2017 Initial Biopsy  ? Diagnosis ?1. Breast, left, needle core biopsy, 5:30 o'clock ?- INVASIVE DUCTAL CARCINOMA, G3 ?2. Lymph node, needle/core biopsy, left axillary ?- NO CARCINOMA IDENTIFIED IN ONE LYMPH NODE (0/1) ?  ?06/06/2017 Initial Diagnosis  ? Malignant neoplasm of lower-outer quadrant of left breast of female, estrogen receptor negative ([place]) ?  ?06/06/2017 Receptors her2  ? Estrogen Receptor: 0%, NEGATIVE ?Progesterone Receptor: 0%, NEGATIVE ?Proliferation Marker Ki67: 70% ?  ?06/26/2017 Surgery  ? LEFT BREAST LUMPECTOMY WITH RADIOACTIVE SEED AND SENTINEL LYMPH NODE BIOPSY ERAS  PATHWAY AND INSERTION PORT-A-CATH ?By Dr. Barry Dienes on 06/26/17  ?  ?06/26/2017 Pathology Results  ? Diagnosis 06/26/17  ?1. Breast, lumpectomy, Left ?- INVASIVE DUCTAL  CARCINOMA, GRADE III/III, SPANNING 1.2 CM. ?- THE SURGICAL RESECTION MARGINS ARE NEGATIVE FOR CARCINOMA. ?- SEE ONCOLOGY TABLE BELOW. ?2. Lymph node, sentinel, biopsy, Left axillary #1 ?- THERE IS NO EVIDENCE OF CARCINOMA IN 1 OF 1 LYMPH NODE (0/1). ?3. Lymph node, sentinel, biopsy, Left axillary #2 ?- THERE IS NO EVIDENCE OF CARCINOMA IN 1 OF 1 LYMPH NODE (0/1). ?4. Lymph node, sentinel, biopsy, Left axillary #3 ?- THERE IS NO EVIDENCE OF CARCINOMA IN 1 OF 1 LYMPH NODE (0/1). ? ?  ?07/25/2017 - 09/29/2017 Chemotherapy  ? Adjuvant cytoxan and docetaxel (TC) every 3 weekd for 4 cycles  ? ?  ?11/19/2017 - 12/17/2017 Radiation Therapy  ? Adjuvant breast radiation ?Left breast treated to 42.5 Gy with 17 fx of 2.5 Gy followed by a boost of 7.5 Gy with 3 fx of 2.5 Gy ?  ? ?  ?02/2019 Procedure  ? She had PAC removal in 02/2019.  ?  ?07/23/2020 Imaging  ? MRI Lumbar Spine 07/23/20 ?IMPRESSION: ?1. No significant disc herniation, spinal canal or neural foraminal ?stenosis at any level. ?2. Moderate facet degenerative changes at L3-4, L4-5 and L5-S1. ?3. Multiple enlarged retroperitoneal and right iliac lymph nodes. ?Recommend correlation with CT of the abdomen and pelvis with ?contrast. ?  ?08/15/2020 Imaging  ? CT AP 08/15/20  ?IMPRESSION: ?1.  Right iliac and periaortic adenopathy is noted concerning for ?metastatic disease or lymphoma. Also noted is abnormal soft tissue ?mass anterior and posterior to the right iliac wing concerning for ?malignancy or metastatic disease. MRI is recommended for further ?evaluation. ?2. Irregular lucency with sclerotic margins is seen involving the ?right iliac crest concerning for possible lytic lesion. MRI may be ?performed for further evaluation. ?3. Enlarged fibroid uterus. ?4. Small gallstone. ?5. Aortic atherosclerosis. ?  ?08/22/2020 Pathology Results  ? FINAL MICROSCOPIC DIAGNOSIS: 08/22/20 ? ?A. NEEDLE CORE, RIGHT GLUTEUS MINIMUMUS, BIOPSY:  ?-  Metastatic carcinoma  ?-  See comment   ? ?COMMENT:  ?By immunohistochemistry, the neoplastic cells are positive for  ?cytokeratin-7 and have weak patchy positivity for GATA3 but are negative for cytokeratin-20 and GCDFP.  The combined morphology and immunophenotype, support m

## 2021-11-08 NOTE — Patient Instructions (Addendum)
[facility]COLOG[facility]  Discharge Instructions: Thank you for choosing [facility] to provide your oncology and hematology care.   If you have a lab appointment with the Cancer Center, please go directly to the Cancer Center and check in at the registration area.   Wear comfortable clothing and clothing appropriate for easy access to any Portacath or PICC line.   We strive to give you quality time with your provider. You may need to reschedule your appointment if you arrive late (15 or more minutes).  Arriving late affects you and other patients whose appointments are after yours.  Also, if you miss three or more appointments without notifying the office, you may be dismissed from the clinic at the provider's discretion.      For prescription refill requests, have your pharmacy contact our office and allow 72 hours for refills to be completed.    Today you received the following chemotherapy and/or immunotherapy agents: Trodelvy.       To help prevent nausea and vomiting after your treatment, we encourage you to take your nausea medication as directed.  BELOW ARE SYMPTOMS THAT SHOULD BE REPORTED IMMEDIATELY: *FEVER GREATER THAN 100.4 F (38 C) OR HIGHER *CHILLS OR SWEATING *NAUSEA AND VOMITING THAT IS NOT CONTROLLED WITH YOUR NAUSEA MEDICATION *UNUSUAL SHORTNESS OF BREATH *UNUSUAL BRUISING OR BLEEDING *URINARY PROBLEMS (pain or burning when urinating, or frequent urination) *BOWEL PROBLEMS (unusual diarrhea, constipation, pain near the anus) TENDERNESS IN MOUTH AND THROAT WITH OR WITHOUT PRESENCE OF ULCERS (sore throat, sores in mouth, or a toothache) UNUSUAL RASH, SWELLING OR PAIN  UNUSUAL VAGINAL DISCHARGE OR ITCHING   Items with * indicate a potential emergency and should be followed up as soon as possible or go to the Emergency Department if any problems should occur.  Please show the CHEMOTHERAPY ALERT CARD or IMMUNOTHERAPY ALERT CARD at check-in to  the Emergency Department and triage nurse.  Should you have questions after your visit or need to cancel or reschedule your appointment, please contact [facility]  Dept: [phone]  and follow the prompts.  Office hours are 8:00 a.m. to 4:30 p.m. Monday - Friday. Please note that voicemails left after 4:00 p.m. may not be returned until the following business day.  We are closed weekends and major holidays. You have access to a nurse at all times for urgent questions. Please call the main number to the clinic Dept: [phone] and follow the prompts.   For any non-urgent questions, you may also contact your provider using MyChart. We now offer e-Visits for anyone 18 and older to request care online for non-urgent symptoms. For details visit [email or web address].   Also download the MyChart app! Go to the app store, search "MyChart", open the app, select [place], and log in with your MyChart username and password.  Due to Covid, a mask is required upon entering the hospital/clinic. If you do not have a mask, one will be given to you upon arrival. For doctor visits, patients may have 1 support person aged 18 or older with them. For treatment visits, patients cannot have anyone with them due to current Covid guidelines and our immunocompromised population.  

## 2021-11-09 ENCOUNTER — Other Ambulatory Visit: Payer: Self-pay

## 2021-11-09 DIAGNOSIS — E1165 Type 2 diabetes mellitus with hyperglycemia: Secondary | ICD-10-CM

## 2021-11-09 DIAGNOSIS — I10 Essential (primary) hypertension: Secondary | ICD-10-CM | POA: Diagnosis not present

## 2021-11-09 MED ORDER — LANTUS SOLOSTAR 100 UNIT/ML ~~LOC~~ SOPN: 15.0000 [IU] | PEN_INJECTOR | Freq: Every day | SUBCUTANEOUS | 5 refills | Status: DC

## 2021-11-12 ENCOUNTER — Telehealth: Payer: Self-pay | Admitting: Hematology

## 2021-11-12 NOTE — Telephone Encounter (Signed)
Scheduled follow-up appointments per 3/30 los. Patient is aware. 

## 2021-11-14 ENCOUNTER — Inpatient Hospital Stay: Payer: Medicare Other | Attending: Hematology

## 2021-11-14 ENCOUNTER — Inpatient Hospital Stay: Payer: Medicare Other

## 2021-11-14 ENCOUNTER — Other Ambulatory Visit: Payer: Self-pay

## 2021-11-14 ENCOUNTER — Encounter: Payer: Self-pay | Admitting: Dietician

## 2021-11-14 VITALS — BP 130/83 | HR 81 | Temp 98.6°F | Resp 18 | Ht 67.0 in | Wt 203.0 lb

## 2021-11-14 DIAGNOSIS — I110 Hypertensive heart disease with heart failure: Secondary | ICD-10-CM | POA: Insufficient documentation

## 2021-11-14 DIAGNOSIS — Z5189 Encounter for other specified aftercare: Secondary | ICD-10-CM | POA: Diagnosis not present

## 2021-11-14 DIAGNOSIS — G629 Polyneuropathy, unspecified: Secondary | ICD-10-CM | POA: Insufficient documentation

## 2021-11-14 DIAGNOSIS — C50512 Malignant neoplasm of lower-outer quadrant of left female breast: Secondary | ICD-10-CM | POA: Diagnosis not present

## 2021-11-14 DIAGNOSIS — Z5112 Encounter for antineoplastic immunotherapy: Secondary | ICD-10-CM | POA: Insufficient documentation

## 2021-11-14 DIAGNOSIS — Z95828 Presence of other vascular implants and grafts: Secondary | ICD-10-CM

## 2021-11-14 DIAGNOSIS — E559 Vitamin D deficiency, unspecified: Secondary | ICD-10-CM | POA: Diagnosis not present

## 2021-11-14 DIAGNOSIS — C7951 Secondary malignant neoplasm of bone: Secondary | ICD-10-CM | POA: Insufficient documentation

## 2021-11-14 DIAGNOSIS — Z452 Encounter for adjustment and management of vascular access device: Secondary | ICD-10-CM | POA: Insufficient documentation

## 2021-11-14 DIAGNOSIS — Z7189 Other specified counseling: Secondary | ICD-10-CM

## 2021-11-14 DIAGNOSIS — I5042 Chronic combined systolic (congestive) and diastolic (congestive) heart failure: Secondary | ICD-10-CM | POA: Diagnosis not present

## 2021-11-14 DIAGNOSIS — Z171 Estrogen receptor negative status [ER-]: Secondary | ICD-10-CM | POA: Insufficient documentation

## 2021-11-14 DIAGNOSIS — E1165 Type 2 diabetes mellitus with hyperglycemia: Secondary | ICD-10-CM | POA: Insufficient documentation

## 2021-11-14 LAB — COMPREHENSIVE METABOLIC PANEL
ALT: 10 U/L (ref 0–44)
AST: 10 U/L — ABNORMAL LOW (ref 15–41)
Albumin: 4.1 g/dL (ref 3.5–5.0)
Alkaline Phosphatase: 117 U/L (ref 38–126)
Anion gap: 5 (ref 5–15)
BUN: 7 mg/dL — ABNORMAL LOW (ref 8–23)
CO2: 27 mmol/L (ref 22–32)
Calcium: 9.3 mg/dL (ref 8.9–10.3)
Chloride: 101 mmol/L (ref 98–111)
Creatinine, Ser: 0.66 mg/dL (ref 0.44–1.00)
GFR, Estimated: >60 mL/min (ref >60)
Glucose, Bld: 386 mg/dL — ABNORMAL HIGH (ref 70–99)
Potassium: 4.4 mmol/L (ref 3.5–5.1)
Sodium: 133 mmol/L — ABNORMAL LOW (ref 135–145)
Total Bilirubin: 0.5 mg/dL (ref 0.3–1.2)
Total Protein: 7.9 g/dL (ref 6.5–8.1)

## 2021-11-14 LAB — CBC WITH DIFFERENTIAL/PLATELET
Abs Immature Granulocytes: 0.03 10*3/uL (ref 0.00–0.07)
Basophils Absolute: 0 10*3/uL (ref 0.0–0.1)
Basophils Relative: 1 %
Eosinophils Absolute: 0.2 10*3/uL (ref 0.0–0.5)
Eosinophils Relative: 3 %
HCT: 34.8 % — ABNORMAL LOW (ref 36.0–46.0)
Hemoglobin: 11.6 g/dL — ABNORMAL LOW (ref 12.0–15.0)
Immature Granulocytes: 1 %
Lymphocytes Relative: 17 %
Lymphs Abs: 1 10*3/uL (ref 0.7–4.0)
MCH: 30.5 pg (ref 26.0–34.0)
MCHC: 33.3 g/dL (ref 30.0–36.0)
MCV: 91.6 fL (ref 80.0–100.0)
Monocytes Absolute: 0.5 10*3/uL (ref 0.1–1.0)
Monocytes Relative: 8 %
Neutro Abs: 4.1 10*3/uL (ref 1.7–7.7)
Neutrophils Relative %: 70 %
Platelets: 448 10*3/uL — ABNORMAL HIGH (ref 150–400)
RBC: 3.8 MIL/uL — ABNORMAL LOW (ref 3.87–5.11)
RDW: 12.6 % (ref 11.5–15.5)
WBC: 5.8 10*3/uL (ref 4.0–10.5)
nRBC: 0 % (ref 0.0–0.2)

## 2021-11-14 MED ORDER — INSULIN ASPART 100 UNIT/ML IJ SOLN
4.0000 [IU] | Freq: Once | INTRAMUSCULAR | Status: AC
  Administered 2021-11-14: 4 [IU] via SUBCUTANEOUS
  Filled 2021-11-14: qty 1

## 2021-11-14 MED ORDER — SODIUM CHLORIDE 0.9% FLUSH
10.0000 mL | INTRAVENOUS | Status: DC | PRN
  Administered 2021-11-14: 10 mL

## 2021-11-14 MED ORDER — HEPARIN SOD (PORK) LOCK FLUSH 100 UNIT/ML IV SOLN
500.0000 [IU] | Freq: Once | INTRAVENOUS | Status: AC | PRN
  Administered 2021-11-14: 500 [IU]

## 2021-11-14 MED ORDER — DIPHENHYDRAMINE HCL 50 MG/ML IJ SOLN
50.0000 mg | Freq: Once | INTRAMUSCULAR | Status: AC
  Administered 2021-11-14: 50 mg via INTRAVENOUS
  Filled 2021-11-14: qty 1

## 2021-11-14 MED ORDER — SODIUM CHLORIDE 0.9 % IV SOLN: Freq: Once | INTRAVENOUS | Status: AC

## 2021-11-14 MED ORDER — FAMOTIDINE IN NACL 20-0.9 MG/50ML-% IV SOLN
20.0000 mg | Freq: Once | INTRAVENOUS | Status: AC
  Administered 2021-11-14: 20 mg via INTRAVENOUS
  Filled 2021-11-14: qty 50

## 2021-11-14 MED ORDER — ACETAMINOPHEN 325 MG PO TABS
650.0000 mg | ORAL_TABLET | Freq: Once | ORAL | Status: AC
  Administered 2021-11-14: 650 mg via ORAL
  Filled 2021-11-14: qty 2

## 2021-11-14 MED ORDER — DEXAMETHASONE SODIUM PHOSPHATE 10 MG/ML IJ SOLN
5.0000 mg | Freq: Once | INTRAMUSCULAR | Status: AC
  Administered 2021-11-14: 5 mg via INTRAVENOUS
  Filled 2021-11-14: qty 1

## 2021-11-14 MED ORDER — ATROPINE SULFATE 1 MG/ML IV SOLN
0.5000 mg | Freq: Once | INTRAVENOUS | Status: AC | PRN
  Administered 2021-11-14: 0.5 mg via INTRAVENOUS
  Filled 2021-11-14: qty 1

## 2021-11-14 MED ORDER — SODIUM CHLORIDE 0.9% FLUSH
10.0000 mL | INTRAVENOUS | Status: DC | PRN
  Administered 2021-11-14: 10 mL via INTRAVENOUS

## 2021-11-14 MED ORDER — SODIUM CHLORIDE 0.9 % IV SOLN
8.0000 mg/kg | Freq: Once | INTRAVENOUS | Status: AC
  Administered 2021-11-14: 720 mg via INTRAVENOUS
  Filled 2021-11-14: qty 72

## 2021-11-14 MED ORDER — PALONOSETRON HCL INJECTION 0.25 MG/5ML
0.2500 mg | Freq: Once | INTRAVENOUS | Status: AC
  Administered 2021-11-14: 0.25 mg via INTRAVENOUS
  Filled 2021-11-14: qty 5

## 2021-11-14 MED ORDER — SODIUM CHLORIDE 0.9 % IV SOLN
150.0000 mg | Freq: Once | INTRAVENOUS | Status: AC
  Administered 2021-11-14: 150 mg via INTRAVENOUS
  Filled 2021-11-14: qty 150

## 2021-11-14 NOTE — Progress Notes (Signed)
OK to adjust Sacituzumab govitecan dose using today's weight per Dr. Burr Medico. ? ?Kennith Center, Pharm.D., CPP ?11/14/2021'@10'$ :32 AM ? ? ?

## 2021-11-14 NOTE — Patient Instructions (Signed)
[facility]COL[facility]  Discharge Instructions: Thank you for choosing [facility] to provide your oncology and hematology care.   If you have a lab appointment with the Cancer Center, please go directly to the Cancer Center and check in at the registration area.   Wear comfortable clothing and clothing appropriate for easy access to any Portacath or PICC line.   We strive to give you quality time with your provider. You may need to reschedule your appointment if you arrive late (15 or more minutes).  Arriving late affects you and other patients whose appointments are after yours.  Also, if you miss three or more appointments without notifying the office, you may be dismissed from the clinic at the provider's discretion.      For prescription refill requests, have your pharmacy contact our office and allow 72 hours for refills to be completed.    Today you received the following chemotherapy and/or immunotherapy agents: Trodelvy.       To help prevent nausea and vomiting after your treatment, we encourage you to take your nausea medication as directed.  BELOW ARE SYMPTOMS THAT SHOULD BE REPORTED IMMEDIATELY: *FEVER GREATER THAN 100.4 F (38 C) OR HIGHER *CHILLS OR SWEATING *NAUSEA AND VOMITING THAT IS NOT CONTROLLED WITH YOUR NAUSEA MEDICATION *UNUSUAL SHORTNESS OF BREATH *UNUSUAL BRUISING OR BLEEDING *URINARY PROBLEMS (pain or burning when urinating, or frequent urination) *BOWEL PROBLEMS (unusual diarrhea, constipation, pain near the anus) TENDERNESS IN MOUTH AND THROAT WITH OR WITHOUT PRESENCE OF ULCERS (sore throat, sores in mouth, or a toothache) UNUSUAL RASH, SWELLING OR PAIN  UNUSUAL VAGINAL DISCHARGE OR ITCHING   Items with * indicate a potential emergency and should be followed up as soon as possible or go to the Emergency Department if any problems should occur.  Please show the CHEMOTHERAPY ALERT CARD or IMMUNOTHERAPY ALERT CARD at check-in to  the Emergency Department and triage nurse.  Should you have questions after your visit or need to cancel or reschedule your appointment, please contact [facility]  Dept: [phone]  and follow the prompts.  Office hours are 8:00 a.m. to 4:30 p.m. Monday - Friday. Please note that voicemails left after 4:00 p.m. may not be returned until the following business day.  We are closed weekends and major holidays. You have access to a nurse at all times for urgent questions. Please call the main number to the clinic Dept: [phone] and follow the prompts.   For any non-urgent questions, you may also contact your provider using MyChart. We now offer e-Visits for anyone 18 and older to request care online for non-urgent symptoms. For details visit [email or web address].   Also download the MyChart app! Go to the app store, search "MyChart", open the app, select [place], and log in with your MyChart username and password.  Due to Covid, a mask is required upon entering the hospital/clinic. If you do not have a mask, one will be given to you upon arrival. For doctor visits, patients may have 1 support Kervin Bones aged 18 or older with them. For treatment visits, patients cannot have anyone with them due to current Covid guidelines and our immunocompromised population.  

## 2021-11-14 NOTE — Progress Notes (Signed)
Pt declined 30 minutes post trodelvy observation ?

## 2021-11-15 ENCOUNTER — Other Ambulatory Visit: Payer: Self-pay | Admitting: Physician Assistant

## 2021-11-15 DIAGNOSIS — E1165 Type 2 diabetes mellitus with hyperglycemia: Secondary | ICD-10-CM

## 2021-11-15 MED ORDER — DEXCOM G7 SENSOR MISC: 1.0000 [IU] | Freq: Two times a day (BID) | 11 refills | Status: DC

## 2021-11-15 MED ORDER — DEXCOM G7 RECEIVER DEVI: 1.0000 | Freq: Two times a day (BID) | 1 refills | Status: DC

## 2021-11-15 NOTE — Progress Notes (Signed)
Nutrition Assessment ? ? ?Reason for Assessment: Urgent provider request (wt loss) ? ? ?ASSESSMENT: 67 year old female with metastatic breast cancer. She is currently receiving Trodelvy. Patient is under the care of Dr. Burr Medico.  ? ?Past medical history includes chronic combined CHF, COPD, dilated cardiomyopathy, DM2, osteoarthritis of lumbar spine, HLD, vit D deficiency, anemia, retinitis pigmentosa.  ? ?Patient woken from sound sleep by RD this afternoon. She was agreeable to visit. Patient reclined in chair under covers and sleepy during conversation. Patient reports appetite is good. She recalls eating 2-3 meals. Patient gets out of bed between 3-5 AM and may eat Meals on Wheels meal that is delivered at 9:30 AM depending on what it is. Patient reports she does not eat chicken or pork. Patient recalls next meal around 11-12 PM which is usually a salad or leftovers from dinner. She recalls variety of foods for dinner (spaghetti, greens, rice, [place] sandwich, lamb, beef). She does not drink milk due to intolerance. Patient previously drank Boost/Ensure supplements, but stopped after seeing they contained canola oil. Patient reports trying plant-based supplements, but her "body can't use it." This is also why she does not eat many foods including cheese, eggs, peanut butter, soy milk/products, lactose free milk/products, and chicken. She denies nausea, vomiting, diarrhea, constipation.  ? ? ?Nutrition Focused Physical Exam: deferred  ? ? ?Medications: coreg, D3, Lasix, Lantus, linzess, metformin, MVI, zofran, roxicodone, percocet, protonix, compazine, entresto, B12 ? ? ?Labs: Na 133, Glucose 386, BUN 7 ? ? ?Anthropometrics: Weights have decreased 3.8% (8lbs) in the last 7 days. Patient weighed 211 lb 3.2 oz on 3/30. ? ?Height: 5'7" ?Weight: 203 lb 12 oz ?UBW: 225-229 lb (last 6 months) ?BMI: 31.79 ? ? ? ?NUTRITION DIAGNOSIS: Inadequate oral intake related to metastatic breast cancer, CHF, COPD as evidenced by dietary  recall, 3.8% decrease in weight in the last week; significant for time frame  ? ? ?INTERVENTION:  ?Educated on importance of adequate calorie and protein energy intake to maintain strength/weight ?Encouraged high calorie, high protein snacks in between meals - handout with ideas provided ?Suggested trying pea protein powder as alternate protein supplement ?Contact information provided  ? ? ?MONITORING, EVALUATION, GOAL: Patient will tolerate increased calories and protein to minimize further weight loss  ? ? ?Next Visit: Thursday April 27 during infusion  ? ? ? ? ? ? ?

## 2021-11-16 ENCOUNTER — Inpatient Hospital Stay: Payer: Medicare Other

## 2021-11-16 ENCOUNTER — Other Ambulatory Visit: Payer: Self-pay

## 2021-11-16 VITALS — BP 144/92 | HR 85 | Temp 98.5°F | Resp 18

## 2021-11-16 DIAGNOSIS — Z452 Encounter for adjustment and management of vascular access device: Secondary | ICD-10-CM | POA: Diagnosis not present

## 2021-11-16 DIAGNOSIS — E1165 Type 2 diabetes mellitus with hyperglycemia: Secondary | ICD-10-CM | POA: Diagnosis not present

## 2021-11-16 DIAGNOSIS — I5042 Chronic combined systolic (congestive) and diastolic (congestive) heart failure: Secondary | ICD-10-CM | POA: Diagnosis not present

## 2021-11-16 DIAGNOSIS — Z5112 Encounter for antineoplastic immunotherapy: Secondary | ICD-10-CM | POA: Diagnosis not present

## 2021-11-16 DIAGNOSIS — C50512 Malignant neoplasm of lower-outer quadrant of left female breast: Secondary | ICD-10-CM | POA: Diagnosis not present

## 2021-11-16 DIAGNOSIS — Z171 Estrogen receptor negative status [ER-]: Secondary | ICD-10-CM | POA: Diagnosis not present

## 2021-11-16 DIAGNOSIS — E559 Vitamin D deficiency, unspecified: Secondary | ICD-10-CM | POA: Diagnosis not present

## 2021-11-16 DIAGNOSIS — Z7189 Other specified counseling: Secondary | ICD-10-CM

## 2021-11-16 DIAGNOSIS — Z5189 Encounter for other specified aftercare: Secondary | ICD-10-CM | POA: Diagnosis not present

## 2021-11-16 DIAGNOSIS — G629 Polyneuropathy, unspecified: Secondary | ICD-10-CM | POA: Diagnosis not present

## 2021-11-16 DIAGNOSIS — I110 Hypertensive heart disease with heart failure: Secondary | ICD-10-CM | POA: Diagnosis not present

## 2021-11-16 DIAGNOSIS — Z95828 Presence of other vascular implants and grafts: Secondary | ICD-10-CM

## 2021-11-16 DIAGNOSIS — C7951 Secondary malignant neoplasm of bone: Secondary | ICD-10-CM | POA: Diagnosis not present

## 2021-11-16 MED ORDER — SODIUM CHLORIDE 0.9 % IV SOLN: Freq: Once | INTRAVENOUS | Status: DC

## 2021-11-16 MED ORDER — PEGFILGRASTIM-BMEZ 6 MG/0.6ML ~~LOC~~ SOSY
6.0000 mg | PREFILLED_SYRINGE | Freq: Once | SUBCUTANEOUS | Status: AC
  Administered 2021-11-16: 6 mg via SUBCUTANEOUS
  Filled 2021-11-16: qty 0.6

## 2021-11-16 MED ORDER — FILGRASTIM-SNDZ 480 MCG/0.8ML IJ SOSY: 480.0000 ug | PREFILLED_SYRINGE | Freq: Once | INTRAMUSCULAR | Status: DC

## 2021-11-21 ENCOUNTER — Ambulatory Visit (INDEPENDENT_AMBULATORY_CARE_PROVIDER_SITE_OTHER): Payer: Medicare Other | Admitting: Podiatry

## 2021-11-21 ENCOUNTER — Encounter: Payer: Self-pay | Admitting: Podiatry

## 2021-11-21 DIAGNOSIS — C801 Malignant (primary) neoplasm, unspecified: Secondary | ICD-10-CM | POA: Diagnosis not present

## 2021-11-21 DIAGNOSIS — B351 Tinea unguium: Secondary | ICD-10-CM | POA: Diagnosis not present

## 2021-11-21 DIAGNOSIS — M79675 Pain in left toe(s): Secondary | ICD-10-CM | POA: Diagnosis not present

## 2021-11-21 DIAGNOSIS — M79674 Pain in right toe(s): Secondary | ICD-10-CM

## 2021-11-21 DIAGNOSIS — E1165 Type 2 diabetes mellitus with hyperglycemia: Secondary | ICD-10-CM | POA: Diagnosis not present

## 2021-11-21 DIAGNOSIS — G63 Polyneuropathy in diseases classified elsewhere: Secondary | ICD-10-CM | POA: Diagnosis not present

## 2021-11-21 NOTE — Progress Notes (Signed)
This patient presents to the office to have her nails trimmed.  She says the nails are painful walking and wearing her shoes.  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 She presents for evaluation and treatment of her nails. ? ?General Appearance  Alert, conversant and in no acute stress. ? ?Vascular  Dorsalis pedis and posterior tibial  pulses are palpable  bilaterally.  Capillary return is within normal limits  bilaterally. Temperature is within normal limits  bilaterally. ? ?Neurologic  Senn-Weinstein monofilament wire test diminished  bilaterally. Muscle power within normal limits bilaterally. ? ?Nails Thick disfigured discolored nails with subungual debris  from hallux to fifth toes bilaterally. No evidence of bacterial infection or drainage bilaterally. ? ?Orthopedic  No limitations of motion  feet .  No crepitus or effusions noted.  No bony pathology or digital deformities noted. ? ?Skin  normotropic skin with no porokeratosis noted bilaterally.  No signs of infections or ulcers noted.    ? ?Onychomycosis ? ?IE.  Debride nails with nail nipper followed by dremel tool usage.   RTC prn ? ? ?[name] DPM   ?

## 2021-11-23 DIAGNOSIS — G629 Polyneuropathy, unspecified: Secondary | ICD-10-CM | POA: Diagnosis not present

## 2021-11-23 DIAGNOSIS — I429 Cardiomyopathy, unspecified: Secondary | ICD-10-CM | POA: Diagnosis not present

## 2021-11-23 DIAGNOSIS — E7800 Pure hypercholesterolemia, unspecified: Secondary | ICD-10-CM | POA: Diagnosis not present

## 2021-11-23 DIAGNOSIS — E1165 Type 2 diabetes mellitus with hyperglycemia: Secondary | ICD-10-CM | POA: Diagnosis not present

## 2021-11-25 ENCOUNTER — Other Ambulatory Visit: Payer: Self-pay | Admitting: Hematology

## 2021-11-26 ENCOUNTER — Encounter: Payer: Self-pay | Admitting: Physician Assistant

## 2021-11-26 ENCOUNTER — Other Ambulatory Visit: Payer: Self-pay | Admitting: Hematology

## 2021-11-26 ENCOUNTER — Ambulatory Visit (INDEPENDENT_AMBULATORY_CARE_PROVIDER_SITE_OTHER): Payer: Medicare Other | Admitting: Physician Assistant

## 2021-11-26 VITALS — BP 130/80 | HR 75 | Ht 67.0 in | Wt 211.6 lb

## 2021-11-26 DIAGNOSIS — E1159 Type 2 diabetes mellitus with other circulatory complications: Secondary | ICD-10-CM

## 2021-11-26 DIAGNOSIS — E1165 Type 2 diabetes mellitus with hyperglycemia: Secondary | ICD-10-CM | POA: Diagnosis not present

## 2021-11-26 DIAGNOSIS — E6609 Other obesity due to excess calories: Secondary | ICD-10-CM | POA: Diagnosis not present

## 2021-11-26 DIAGNOSIS — I152 Hypertension secondary to endocrine disorders: Secondary | ICD-10-CM

## 2021-11-26 DIAGNOSIS — Z6833 Body mass index (BMI) 33.0-33.9, adult: Secondary | ICD-10-CM

## 2021-11-26 NOTE — Progress Notes (Signed)
? ?Established Patient Office Visit ? ?Subjective:  ?Patient ID: Kristin Ward, female    DOB: 06-14-1956  Age: 66 y.o. MRN: [id number] ? ?CC:  ?Chief Complaint  ?Patient presents with  ? Follow-up  ?  6 week follow up on diabetes  ? ? ?HPI ?Kristin Ward presents for a follow up of diabetes; states she is now followed by an Endocrinologist Dr. Jacelyn Pi will return in 01/2022 for a follow up appointment and is taking Lantus 20 units hs, Humalog sliding scale 3 times a day; also states she is using the Dexcom G7 to check her blood sugars now; denies any new issues ? ? ? ? ?Outpatient Medications Prior to Visit  ?Medication Sig Dispense Refill  ? Accu-Chek FastClix Lancets MISC TEST TWICE A DAY. PT USES AN ACCU-CHEK GUIDE ME METER 102 each 2  ? aspirin 81 MG chewable tablet Chew 81 mg by mouth daily.    ? atorvastatin (LIPITOR) 20 MG tablet Take 1 tablet (20 mg total) by mouth daily. 90 tablet 3  ? brimonidine (ALPHAGAN) 0.2 % ophthalmic solution Place 1 drop into both eyes 2 (two) times daily.    ? carvedilol (COREG) 12.5 MG tablet Take 1 tablet (12.5 mg total) by mouth 2 (two) times daily with a meal. 180 tablet 1  ? Cholecalciferol (D3-1000 PO) Take 2 tablets by mouth daily.    ? Continuous Blood Gluc Receiver (DEXCOM G7 RECEIVER) DEVI 1 Device by Does not apply route 2 (two) times daily. 1 each 1  ? Continuous Blood Gluc Sensor (DEXCOM G7 SENSOR) MISC 1 Units by Does not apply route 2 (two) times daily. 3 each 11  ? furosemide (LASIX) 20 MG tablet TAKE 2 TABLETS (40 MG TOTAL) BY MOUTH AS NEEDED. FOR WEIGHT GAIN 3 TO 4 LBS. 90 tablet 3  ? ibuprofen (ADVIL) 800 MG tablet Take 1 tablet (800 mg total) by mouth every 8 (eight) hours as needed. 30 tablet 2  ? insulin glargine (LANTUS SOLOSTAR) 100 UNIT/ML Solostar Pen Inject 15 Units into the skin at bedtime. 15 mL 5  ? insulin lispro protamine-lispro (HUMALOG 50/50 MIX) (50-50) 100 UNIT/ML SUSP injection Inject into the skin 2 (two) times daily before  a meal.    ? Lancets Misc. (ACCU-CHEK SOFTCLIX LANCET DEV) KIT 1 Units by Does not apply route 2 (two) times daily. Check FSBS BID - Include strips # 50 with 5 refills, Lancets #50 with 5 refills DX: Type 2 DM - ICD 10: E11.9 1 kit 0  ? lidocaine-prilocaine (EMLA) cream Apply 1 application topically as needed. 30 g 0  ? linaclotide (LINZESS) 72 MCG capsule Take 1 capsule (72 mcg total) by mouth daily before breakfast. MUST HAVE APPOINTMENT FOR REFILLS 30 capsule 1  ? metFORMIN (GLUCOPHAGE) 1000 MG tablet Take 1 tablet (1,000 mg total) by mouth 2 (two) times daily with a meal. 180 tablet 3  ? Multiple Vitamins-Minerals (WOMENS MULTI PO) Take by mouth.    ? ondansetron (ZOFRAN) 8 MG tablet Take 1 tablet (8 mg total) by mouth every 8 (eight) hours as needed for nausea or vomiting (begin on day 3 after chemo). 30 tablet 1  ? oxyCODONE (OXY IR/ROXICODONE) 5 MG immediate release tablet     ? oxyCODONE (OXYCONTIN) 10 mg 12 hr tablet     ? oxyCODONE-acetaminophen (PERCOCET/ROXICET) 5-325 MG tablet     ? pantoprazole (PROTONIX) 40 MG tablet TAKE 1 TABLET BY MOUTH EVERY DAY 30 tablet 0  ? prochlorperazine (COMPAZINE) 10  MG tablet Take 1 tablet (10 mg total) by mouth every 6 (six) hours as needed (Nausea or vomiting). 30 tablet 1  ? sacubitril-valsartan (ENTRESTO) 97-103 MG Take 1 tablet by mouth 2 (two) times daily. 180 tablet 3  ? Semaglutide (RYBELSUS) 7 MG TABS Take 7 mg by mouth daily. 90 tablet 3  ? vitamin B-12 (CYANOCOBALAMIN) 500 MCG tablet Take 1,000 mcg by mouth daily.    ? vitamin C (ASCORBIC ACID) 500 MG tablet Take 500 mg by mouth in the morning, at noon, in the evening, and at bedtime.    ? zinc gluconate 50 MG tablet Take 50 mg by mouth daily.    ? Blood Glucose Calibration (ACCU-CHEK GUIDE CONTROL VI) In Vitro    ? glucose blood (ACCU-CHEK GUIDE) test strip Use as instructed to test blood sugar 2 times a day 100 strip 2  ? spironolactone (ALDACTONE) 25 MG tablet Take 1 tablet (25 mg total) by mouth daily.  (Patient not taking: Reported on 10/12/2021) 90 tablet 3  ? ?Facility-Administered Medications Prior to Visit  ?Medication Dose Route Frequency Provider Last Rate Last Admin  ? insulin glulisine (APIDRA) injection 6 Units  6 Units Subcutaneous Once Irene Pap, PA-C      ? ? ?Allergies  ?Allergen Reactions  ? Morphine And Related Nausea And Vomiting  ? Latex Hives and Itching  ?  Burning   ? Tylenol With Codeine #3 [Acetaminophen-Codeine] Nausea And Vomiting  ? Penicillins Nausea Only, Swelling and Rash  ?  Has patient had a PCN reaction causing immediate rash, facial/tongue/throat swelling, SOB or lightheadedness with hypotension: Yes ?Has patient had a PCN reaction causing severe rash involving mucus membranes or skin necrosis: Yes ?Has patient had a PCN reaction that required hospitalization: No ?Has patient had a PCN reaction occurring within the last 10 years: No ?[place] ?If all of the above answers are "NO", then may proceed with Cephalosporin use. ?  ? ? ?ROS ?Review of Systems  ?Constitutional:  Negative for activity change and chills.  ?HENT:  Negative for congestion and voice change.   ?Eyes:  Negative for pain and redness.  ?Respiratory:  Negative for cough and wheezing.   ?Cardiovascular:  Negative for chest pain.  ?Gastrointestinal:  Negative for constipation, diarrhea, nausea and vomiting.  ?Endocrine: Negative for polyuria.  ?Genitourinary:  Negative for frequency.  ?Skin:  Negative for color change and rash.  ?Allergic/Immunologic: Negative for immunocompromised state.  ?Neurological:  Negative for dizziness.  ?Psychiatric/Behavioral:  Negative for agitation.   ? ?  ?Objective:  ?  ?Physical Exam ?Vitals and nursing note reviewed.  ?Constitutional:   ?   General: She is not in acute distress. ?   Appearance: She is normal weight. She is not ill-appearing.  ?HENT:  ?   Head: Normocephalic and atraumatic.  ?   Right Ear: External ear normal.  ?   Left Ear: External  ear normal.  ?Eyes:  ?   Extraocular Movements: Extraocular movements intact.  ?   Conjunctiva/sclera: Conjunctivae normal.  ?   Pupils: Pupils are equal, round, and reactive to light.  ?Cardiovascular:  ?   Rate and Rhythm: Normal rate and regular rhythm.  ?Pulmonary:  ?   Effort: Pulmonary effort is normal.  ?   Breath sounds: Normal breath sounds. No wheezing.  ?Abdominal:  ?   General: Bowel sounds are normal.  ?   Palpations: Abdomen is soft.  ?Musculoskeletal:     ?  General: Normal range of motion.  ?   Cervical back: Normal range of motion.  ?Skin: ?   General: Skin is warm and dry.  ?Neurological:  ?   Mental Status: She is alert and oriented to person, place, and time.  ?Psychiatric:     ?   Mood and Affect: Mood normal.  ? ? ?BP 130/80   Pulse 75   Wt 211 lb 9.6 oz (96 kg)   SpO2 95%   BMI 33.14 kg/m?  ? ?Wt Readings from Last 3 Encounters:  ?11/26/21 211 lb 9.6 oz (96 kg)  ?11/14/21 203 lb (92.1 kg)  ?11/08/21 211 lb 3.2 oz (95.8 kg)  ? ? ?Results for orders placed or performed in visit on 11/14/21  ?Comprehensive metabolic panel  ?Result Value Ref Range  ? Sodium 133 (L) 135 - 145 mmol/L  ? Potassium 4.4 3.5 - 5.1 mmol/L  ? Chloride 101 98 - 111 mmol/L  ? CO2 27 22 - 32 mmol/L  ? Glucose, Bld 386 (H) 70 - 99 mg/dL  ? BUN 7 (L) 8 - 23 mg/dL  ? Creatinine, Ser 0.66 0.44 - 1.00 mg/dL  ? Calcium 9.3 8.9 - 10.3 mg/dL  ? Total Protein 7.9 6.5 - 8.1 g/dL  ? Albumin 4.1 3.5 - 5.0 g/dL  ? AST 10 (L) 15 - 41 U/L  ? ALT 10 0 - 44 U/L  ? Alkaline Phosphatase 117 38 - 126 U/L  ? Total Bilirubin 0.5 0.3 - 1.2 mg/dL  ? GFR, Estimated >60 >60 mL/min  ? Anion gap 5 5 - 15  ?CBC with Differential  ?Result Value Ref Range  ? WBC 5.8 4.0 - 10.5 K/uL  ? RBC 3.80 (L) 3.87 - 5.11 MIL/uL  ? Hemoglobin 11.6 (L) 12.0 - 15.0 g/dL  ? HCT 34.8 (L) 36.0 - 46.0 %  ? MCV 91.6 80.0 - 100.0 fL  ? MCH 30.5 26.0 - 34.0 pg  ? MCHC 33.3 30.0 - 36.0 g/dL  ? RDW 12.6 11.5 - 15.5 %  ? Platelets 448 (H) 150 - 400 K/uL  ? nRBC 0.0 0.0 - 0.2  %  ? Neutrophils Relative % 70 %  ? Neutro Abs 4.1 1.7 - 7.7 K/uL  ? Lymphocytes Relative 17 %  ? Lymphs Abs 1.0 0.7 - 4.0 K/uL  ? Monocytes Relative 8 %  ? Monocytes Absolute 0.5 0.1 - 1.0 K/uL  ? Eosinophils

## 2021-11-26 NOTE — Assessment & Plan Note (Signed)
controlled, continue  Carvedilol 12.5 mg bid, eat a low salt diet, do not add any salt to food when cooking, avoid processed foods, avoid fried foods  ? ?

## 2021-11-26 NOTE — Assessment & Plan Note (Signed)
Future hgb a1c ordered; continue follow up with Endocrinology; eat a low sugar, low carbohydrate diet ?

## 2021-11-26 NOTE — Assessment & Plan Note (Signed)
Will monitor

## 2021-11-27 ENCOUNTER — Encounter (HOSPITAL_COMMUNITY): Payer: Self-pay

## 2021-11-27 ENCOUNTER — Ambulatory Visit (HOSPITAL_COMMUNITY)
Admission: RE | Admit: 2021-11-27 | Discharge: 2021-11-27 | Disposition: A | Discharge status: Home / Self Care | Payer: Medicare Other | Source: Ambulatory Visit | Attending: Hematology | Admitting: Hematology

## 2021-11-27 DIAGNOSIS — Z171 Estrogen receptor negative status [ER-]: Secondary | ICD-10-CM | POA: Diagnosis not present

## 2021-11-27 DIAGNOSIS — C50912 Malignant neoplasm of unspecified site of left female breast: Secondary | ICD-10-CM | POA: Diagnosis not present

## 2021-11-27 DIAGNOSIS — C50512 Malignant neoplasm of lower-outer quadrant of left female breast: Secondary | ICD-10-CM | POA: Insufficient documentation

## 2021-11-27 DIAGNOSIS — M7621 Iliac crest spur, right hip: Secondary | ICD-10-CM | POA: Diagnosis not present

## 2021-11-27 MED ORDER — HEPARIN SOD (PORK) LOCK FLUSH 100 UNIT/ML IV SOLN: 500.0000 [IU] | Freq: Once | INTRAVENOUS | Status: AC

## 2021-11-27 MED ORDER — IOHEXOL 300 MG/ML  SOLN
100.0000 mL | Freq: Once | INTRAMUSCULAR | Status: AC | PRN
  Administered 2021-11-27: 100 mL via INTRAVENOUS

## 2021-11-27 MED ORDER — SODIUM CHLORIDE (PF) 0.9 % IJ SOLN
INTRAMUSCULAR | Status: AC
  Filled 2021-11-27: qty 50

## 2021-11-27 MED ORDER — HEPARIN SOD (PORK) LOCK FLUSH 100 UNIT/ML IV SOLN
INTRAVENOUS | Status: AC
  Administered 2021-11-27: 500 [IU] via INTRAVENOUS
  Filled 2021-11-27: qty 5

## 2021-11-27 NOTE — Progress Notes (Signed)
?[facility]   ?[phone] [phone]   ?Clinic Follow up Note  ? ?Patient Care Team: ?[name] as PCP - General (Physician Assistant) ?[name], MD as PCP - Cardiology (Cardiology) ?[name], MD as Consulting Physician (Hematology) ?[name], MD as Consulting Physician (General Surgery) ?[name], [facility] as Pharmacist (Pharmacist) ?Jacelyn Pi, MD as Referring Physician (Endocrinology) ?11/29/2021 ? ?CHIEF COMPLAINT: Follow up metastatic breast cancer  ? ?SUMMARY OF ONCOLOGIC HISTORY: ?Oncology History Overview Note  ?Cancer Staging ?Malignant neoplasm of lower-outer quadrant of left breast of female, estrogen receptor negative ([place]) ?Staging form: Breast, AJCC 8th Edition ?- Clinical stage from 06/06/2017: Stage IB (cT1c, cN0, cM0, G3, ER-, PR-, HER2-) - Signed by [name], MD on 06/15/2017 ?Nuclear grade: G3 ?Histologic grading system: 3 grade system ?- Pathologic stage from 06/26/2017: Stage IB (pT1c, pN0, cM0, G3, ER-, PR-, HER2-) - Signed by [name], MD on 07/10/2017 ?Neoadjuvant therapy: No ?Nuclear grade: G3 ?Multigene prognostic tests performed: None ?Histologic grading system: 3 grade system ?Laterality: Left ? ? ?  ?Malignant neoplasm of lower-outer quadrant of left breast of female, estrogen receptor negative ([place])  ?05/30/2017 Mammogram  ? Diagnostic mammo and [place] ?IMPRESSION: ?1. Highly suspicious 1.4 cm mass in the slightly lower slightly ?outer left breast -tissue sampling recommended. ?2. Indeterminate 0.5 mm mass in the slightly lower slightly outer ?left breast -tissue sampling recommended. ?3. At least 2 left axillary lymph nodes with borderline cortical ?thickness. ? ?  ?06/06/2017 Initial Biopsy  ? Diagnosis ?1. Breast, left, needle core biopsy, 5:30 o'clock ?- INVASIVE DUCTAL CARCINOMA, G3 ?2. Lymph node, needle/core biopsy, left axillary ?- NO CARCINOMA IDENTIFIED IN ONE LYMPH NODE (0/1) ? ?  ?06/06/2017 Initial  Diagnosis  ? Malignant neoplasm of lower-outer quadrant of left breast of female, estrogen receptor negative ([place]) ? ?  ?06/06/2017 Receptors her2  ? Estrogen Receptor: 0%, NEGATIVE ?Progesterone Receptor: 0%, NEGATIVE ?Proliferation Marker Ki67: 70% ? ?  ?06/26/2017 Surgery  ? LEFT BREAST LUMPECTOMY WITH RADIOACTIVE SEED AND SENTINEL LYMPH NODE BIOPSY ERAS  PATHWAY AND INSERTION PORT-A-CATH ?By Dr. Barry Dienes on 06/26/17  ? ?  ?06/26/2017 Pathology Results  ? Diagnosis 06/26/17  ?1. Breast, lumpectomy, Left ?- INVASIVE DUCTAL CARCINOMA, GRADE III/III, SPANNING 1.2 CM. ?- THE SURGICAL RESECTION MARGINS ARE NEGATIVE FOR CARCINOMA. ?- SEE ONCOLOGY TABLE BELOW. ?2. Lymph node, sentinel, biopsy, Left axillary #1 ?- THERE IS NO EVIDENCE OF CARCINOMA IN 1 OF 1 LYMPH NODE (0/1). ?3. Lymph node, sentinel, biopsy, Left axillary #2 ?- THERE IS NO EVIDENCE OF CARCINOMA IN 1 OF 1 LYMPH NODE (0/1). ?4. Lymph node, sentinel, biopsy, Left axillary #3 ?- THERE IS NO EVIDENCE OF CARCINOMA IN 1 OF 1 LYMPH NODE (0/1). ? ? ?  ?07/25/2017 - 09/29/2017 Chemotherapy  ? Adjuvant cytoxan and docetaxel (TC) every 3 weekd for 4 cycles  ? ? ?  ?11/19/2017 - 12/17/2017 Radiation Therapy  ? Adjuvant breast radiation ?Left breast treated to 42.5 Gy with 17 fx of 2.5 Gy followed by a boost of 7.5 Gy with 3 fx of 2.5 Gy ?  ? ?  ?02/2019 Procedure  ? She had PAC removal in 02/2019.  ?  ?07/23/2020 Imaging  ? MRI Lumbar Spine 07/23/20 ?IMPRESSION: ?1. No significant disc herniation, spinal canal or neural foraminal ?stenosis at any level. ?2. Moderate facet degenerative changes at L3-4, L4-5 and L5-S1. ?3. Multiple enlarged retroperitoneal and right iliac lymph nodes. ?Recommend correlation with CT of the abdomen and  pelvis with ?contrast. ?  ?08/15/2020 Imaging  ? CT AP 08/15/20  ?IMPRESSION: ?1. Right iliac and periaortic adenopathy is noted concerning for ?metastatic disease or lymphoma. Also noted is abnormal soft tissue ?mass anterior and posterior to the  right iliac wing concerning for ?malignancy or metastatic disease. MRI is recommended for further ?evaluation. ?2. Irregular lucency with sclerotic margins is seen involving the ?right iliac crest concerning for possible lytic lesion. MRI may be ?performed for further evaluation. ?3. Enlarged fibroid uterus. ?4. Small gallstone. ?5. Aortic atherosclerosis. ?  ?08/22/2020 Pathology Results  ? FINAL MICROSCOPIC DIAGNOSIS: 08/22/20 ? ?A. NEEDLE CORE, RIGHT GLUTEUS MINIMUMUS, BIOPSY:  ?-  Metastatic carcinoma  ?-  See comment  ? ?COMMENT:  ?By immunohistochemistry, the neoplastic cells are positive for  ?cytokeratin-7 and have weak patchy positivity for GATA3 but are negative for cytokeratin-20 and GCDFP.  The combined morphology and immunophenotype, support metastasis from the patient's known breast primary carcinoma.  ? ?ADDENDUM:  ?PROGNOSTIC INDICATOR RESULTS:  ?The tumor cells are NEGATIVE for Her2 (0).  ?Estrogen Receptor:       POSITIVE, 20%, WEAK STAINING  ?Progesterone Receptor:   NEGATIVE  ? ?PD-L1 (0) negative  ?  ?09/04/2020 - 09/22/2020 Radiation Therapy  ? Palliative radiation to right iliac 09/04/20 - 09/22/20, Dr. Lisbeth Renshaw ?  ?09/26/2020 -  Chemotherapy  ? first line weekly Taxol 3 weeks on/1 week off starting 09/26/20 ?--Reduced to 2 weeks on/1 week off starting with C3 due to neuropathy. Stopped after C4 on 12/18/20 due to neuratrophy.  ?--Switched to Xeloda on 01/01/21 at 1552m in the AM and 20047min the PM for 2 weeks on/1 week off  ? ?  ?10/13/2020 Procedure  ?  PAC placement on 10/13/20.  ?  ?04/10/2021 Imaging  ? CT CAP ? ?IMPRESSION: ?1. Enlarging abdominopelvic retroperitoneal and right inguinal ?adenopathy, compatible with disease progression. ?2. Lytic metastasis in the right iliac wing, similar. ?3. Hepatomegaly. ?4. Cholelithiasis. ?5. Enlarged fibroid uterus. ?6.  Aortic atherosclerosis (ICD10-I70.0). ?  ?06/18/2021 Imaging  ? CT CAP ? ?IMPRESSION: ?1. Today's study demonstrates progression of metastatic  disease as evidence by increased number and size of numerous enlarged lymph nodes in the retroperitoneum, along the right pelvic sidewall, and in the upper right thigh. ?2. Osseous metastases in the bony pelvis appear similar to the prior examination. No definite new osseous lesions are otherwise noted. ?3. Fibroid uterus again noted. ?4. Cholelithiasis without evidence of acute cholecystitis. ?5. Aortic atherosclerosis, in addition to left anterior descending ?coronary artery disease. Please note that although the presence of ?coronary artery calcium documents the presence of coronary artery ?disease, the severity of this disease and any potential stenosis ?cannot be assessed on this non-gated CT examination. Assessment for potential risk factor modification, dietary therapy or pharmacologic therapy may be warranted, if clinically indicated. ?6. Additional incidental findings, as above. ?  ?06/20/2021 Imaging  ? Bone Scan ? ?IMPRESSION: ?No osseous uptake suspicious for recurrent/progressive metastatic ?disease. ?  ?07/03/2021 -  Chemotherapy  ? Patient is on Treatment Plan : BREAST METASTATIC Sacituzumab govitecan-hziy (TIvette Loyalq21d  ? ?  ?  ?09/27/2021 Imaging  ? EXAM: ?CT CHEST, ABDOMEN, AND PELVIS WITH CONTRAST ? ?IMPRESSION: ?1. Marked interval improvement in previously demonstrated ?lymphadenopathy in the abdomen and pelvis. A few prominent right inguinal lymph nodes remain. ?2. Stable osseous metastatic disease in the pelvis. ?3. No evidence of local recurrence or other metastatic disease. ?4. Stable incidental findings including cholelithiasis, uterine ?fibroids and Aortic Atherosclerosis (ICD10-I70.0). ? ?  ADDENDUM: ?Not mentioned in the original impression is a possible nonocclusive ?fibrin sheath along the distal aspect of the Port-A-Cath. ?  ?09/27/2021 Imaging  ? EXAM: ?NUCLEAR MEDICINE WHOLE BODY BONE SCAN ? ?IMPRESSION: ?Stable examination, without abnormal osseous uptake. ?  ?11/27/2021 Imaging  ? CT  AP IMPRESSION: ?1. Stable lytic and sclerotic lesion in the RIGHT iliac wing. ?2. No evidence of visceral metastasis or adenopathy in the abdomenpelvis. ?3. Leiomyomatous uterus. ? ?ADDENDUM REPORT: 11/29/2021 09

## 2021-11-29 ENCOUNTER — Inpatient Hospital Stay: Payer: Medicare Other

## 2021-11-29 ENCOUNTER — Other Ambulatory Visit: Payer: Self-pay

## 2021-11-29 ENCOUNTER — Inpatient Hospital Stay (HOSPITAL_BASED_OUTPATIENT_CLINIC_OR_DEPARTMENT_OTHER): Payer: Medicare Other | Admitting: Nurse Practitioner

## 2021-11-29 ENCOUNTER — Encounter: Payer: Self-pay | Admitting: Nurse Practitioner

## 2021-11-29 VITALS — BP 148/90 | HR 87 | Temp 98.8°F | Resp 18 | Wt 208.1 lb

## 2021-11-29 DIAGNOSIS — G629 Polyneuropathy, unspecified: Secondary | ICD-10-CM | POA: Diagnosis not present

## 2021-11-29 DIAGNOSIS — Z171 Estrogen receptor negative status [ER-]: Secondary | ICD-10-CM | POA: Diagnosis not present

## 2021-11-29 DIAGNOSIS — C50512 Malignant neoplasm of lower-outer quadrant of left female breast: Secondary | ICD-10-CM

## 2021-11-29 DIAGNOSIS — I110 Hypertensive heart disease with heart failure: Secondary | ICD-10-CM | POA: Diagnosis not present

## 2021-11-29 DIAGNOSIS — Z452 Encounter for adjustment and management of vascular access device: Secondary | ICD-10-CM | POA: Diagnosis not present

## 2021-11-29 DIAGNOSIS — Z7189 Other specified counseling: Secondary | ICD-10-CM

## 2021-11-29 DIAGNOSIS — E559 Vitamin D deficiency, unspecified: Secondary | ICD-10-CM | POA: Diagnosis not present

## 2021-11-29 DIAGNOSIS — Z95828 Presence of other vascular implants and grafts: Secondary | ICD-10-CM

## 2021-11-29 DIAGNOSIS — E1165 Type 2 diabetes mellitus with hyperglycemia: Secondary | ICD-10-CM

## 2021-11-29 DIAGNOSIS — C7951 Secondary malignant neoplasm of bone: Secondary | ICD-10-CM | POA: Diagnosis not present

## 2021-11-29 DIAGNOSIS — Z5189 Encounter for other specified aftercare: Secondary | ICD-10-CM | POA: Diagnosis not present

## 2021-11-29 DIAGNOSIS — Z5112 Encounter for antineoplastic immunotherapy: Secondary | ICD-10-CM | POA: Diagnosis not present

## 2021-11-29 DIAGNOSIS — I5042 Chronic combined systolic (congestive) and diastolic (congestive) heart failure: Secondary | ICD-10-CM | POA: Diagnosis not present

## 2021-11-29 LAB — CBC WITH DIFFERENTIAL/PLATELET
Abs Immature Granulocytes: 0.03 10*3/uL (ref 0.00–0.07)
Basophils Absolute: 0 10*3/uL (ref 0.0–0.1)
Basophils Relative: 0 %
Eosinophils Absolute: 0.3 10*3/uL (ref 0.0–0.5)
Eosinophils Relative: 4 %
HCT: 34.4 % — ABNORMAL LOW (ref 36.0–46.0)
Hemoglobin: 11.1 g/dL — ABNORMAL LOW (ref 12.0–15.0)
Immature Granulocytes: 1 %
Lymphocytes Relative: 16 %
Lymphs Abs: 1 10*3/uL (ref 0.7–4.0)
MCH: 29.8 pg (ref 26.0–34.0)
MCHC: 32.3 g/dL (ref 30.0–36.0)
MCV: 92.5 fL (ref 80.0–100.0)
Monocytes Absolute: 0.5 10*3/uL (ref 0.1–1.0)
Monocytes Relative: 7 %
Neutro Abs: 4.6 10*3/uL (ref 1.7–7.7)
Neutrophils Relative %: 72 %
Platelets: 222 10*3/uL (ref 150–400)
RBC: 3.72 MIL/uL — ABNORMAL LOW (ref 3.87–5.11)
RDW: 13.9 % (ref 11.5–15.5)
WBC: 6.4 10*3/uL (ref 4.0–10.5)
nRBC: 0 % (ref 0.0–0.2)

## 2021-11-29 LAB — COMPREHENSIVE METABOLIC PANEL
ALT: 9 U/L (ref 0–44)
AST: 11 U/L — ABNORMAL LOW (ref 15–41)
Albumin: 3.7 g/dL (ref 3.5–5.0)
Alkaline Phosphatase: 128 U/L — ABNORMAL HIGH (ref 38–126)
Anion gap: 6 (ref 5–15)
BUN: 8 mg/dL (ref 8–23)
CO2: 28 mmol/L (ref 22–32)
Calcium: 8.9 mg/dL (ref 8.9–10.3)
Chloride: 107 mmol/L (ref 98–111)
Creatinine, Ser: 0.63 mg/dL (ref 0.44–1.00)
GFR, Estimated: >60 mL/min (ref >60)
Glucose, Bld: 262 mg/dL — ABNORMAL HIGH (ref 70–99)
Potassium: 3.9 mmol/L (ref 3.5–5.1)
Sodium: 141 mmol/L (ref 135–145)
Total Bilirubin: 0.5 mg/dL (ref 0.3–1.2)
Total Protein: 7.1 g/dL (ref 6.5–8.1)

## 2021-11-29 MED ORDER — SODIUM CHLORIDE 0.9 % IV SOLN
8.0000 mg/kg | Freq: Once | INTRAVENOUS | Status: AC
  Administered 2021-11-29: 720 mg via INTRAVENOUS
  Filled 2021-11-29: qty 72

## 2021-11-29 MED ORDER — ONDANSETRON HCL 8 MG PO TABS
8.0000 mg | ORAL_TABLET | Freq: Three times a day (TID) | ORAL | 1 refills | Status: DC | PRN
Start: 2021-11-29 — End: 2021-12-21

## 2021-11-29 MED ORDER — DIPHENHYDRAMINE HCL 50 MG/ML IJ SOLN
50.0000 mg | Freq: Once | INTRAMUSCULAR | Status: AC
  Administered 2021-11-29: 50 mg via INTRAVENOUS
  Filled 2021-11-29: qty 1

## 2021-11-29 MED ORDER — SODIUM CHLORIDE 0.9 % IV SOLN: Freq: Once | INTRAVENOUS | Status: AC

## 2021-11-29 MED ORDER — SODIUM CHLORIDE 0.9% FLUSH
10.0000 mL | INTRAVENOUS | Status: DC | PRN
  Administered 2021-11-29: 10 mL via INTRAVENOUS

## 2021-11-29 MED ORDER — SODIUM CHLORIDE 0.9 % IV SOLN
150.0000 mg | Freq: Once | INTRAVENOUS | Status: AC
  Administered 2021-11-29: 150 mg via INTRAVENOUS
  Filled 2021-11-29: qty 150

## 2021-11-29 MED ORDER — SODIUM CHLORIDE 0.9% FLUSH
10.0000 mL | INTRAVENOUS | Status: DC | PRN
  Administered 2021-11-29: 10 mL

## 2021-11-29 MED ORDER — DEXAMETHASONE SODIUM PHOSPHATE 10 MG/ML IJ SOLN
5.0000 mg | Freq: Once | INTRAMUSCULAR | Status: AC
  Administered 2021-11-29: 5 mg via INTRAVENOUS
  Filled 2021-11-29: qty 1

## 2021-11-29 MED ORDER — HEPARIN SOD (PORK) LOCK FLUSH 100 UNIT/ML IV SOLN
500.0000 [IU] | Freq: Once | INTRAVENOUS | Status: AC | PRN
  Administered 2021-11-29: 500 [IU]

## 2021-11-29 MED ORDER — ATROPINE SULFATE 1 MG/ML IV SOLN
0.5000 mg | Freq: Once | INTRAVENOUS | Status: AC | PRN
  Administered 2021-11-29: 0.5 mg via INTRAVENOUS
  Filled 2021-11-29: qty 1

## 2021-11-29 MED ORDER — ACETAMINOPHEN 325 MG PO TABS
650.0000 mg | ORAL_TABLET | Freq: Once | ORAL | Status: AC
  Administered 2021-11-29: 650 mg via ORAL
  Filled 2021-11-29: qty 2

## 2021-11-29 MED ORDER — PALONOSETRON HCL INJECTION 0.25 MG/5ML
0.2500 mg | Freq: Once | INTRAVENOUS | Status: AC
  Administered 2021-11-29: 0.25 mg via INTRAVENOUS
  Filled 2021-11-29: qty 5

## 2021-11-29 MED ORDER — FAMOTIDINE IN NACL 20-0.9 MG/50ML-% IV SOLN
20.0000 mg | Freq: Once | INTRAVENOUS | Status: AC
  Administered 2021-11-29: 20 mg via INTRAVENOUS
  Filled 2021-11-29: qty 50

## 2021-11-29 NOTE — Patient Instructions (Signed)
[facility]COL[facility]  Discharge Instructions: Thank you for choosing [facility] to provide your oncology and hematology care.   If you have a lab appointment with the Cancer Center, please go directly to the Cancer Center and check in at the registration area.   Wear comfortable clothing and clothing appropriate for easy access to any Portacath or PICC line.   We strive to give you quality time with your provider. You may need to reschedule your appointment if you arrive late (15 or more minutes).  Arriving late affects you and other patients whose appointments are after yours.  Also, if you miss three or more appointments without notifying the office, you may be dismissed from the clinic at the provider's discretion.      For prescription refill requests, have your pharmacy contact our office and allow 72 hours for refills to be completed.    Today you received the following chemotherapy and/or immunotherapy agents: Trodelvy.       To help prevent nausea and vomiting after your treatment, we encourage you to take your nausea medication as directed.  BELOW ARE SYMPTOMS THAT SHOULD BE REPORTED IMMEDIATELY: *FEVER GREATER THAN 100.4 F (38 C) OR HIGHER *CHILLS OR SWEATING *NAUSEA AND VOMITING THAT IS NOT CONTROLLED WITH YOUR NAUSEA MEDICATION *UNUSUAL SHORTNESS OF BREATH *UNUSUAL BRUISING OR BLEEDING *URINARY PROBLEMS (pain or burning when urinating, or frequent urination) *BOWEL PROBLEMS (unusual diarrhea, constipation, pain near the anus) TENDERNESS IN MOUTH AND THROAT WITH OR WITHOUT PRESENCE OF ULCERS (sore throat, sores in mouth, or a toothache) UNUSUAL RASH, SWELLING OR PAIN  UNUSUAL VAGINAL DISCHARGE OR ITCHING   Items with * indicate a potential emergency and should be followed up as soon as possible or go to the Emergency Department if any problems should occur.  Please show the CHEMOTHERAPY ALERT CARD or IMMUNOTHERAPY ALERT CARD at check-in to  the Emergency Department and triage nurse.  Should you have questions after your visit or need to cancel or reschedule your appointment, please contact [facility]  Dept: [phone]  and follow the prompts.  Office hours are 8:00 a.m. to 4:30 p.m. Monday - Friday. Please note that voicemails left after 4:00 p.m. may not be returned until the following business day.  We are closed weekends and major holidays. You have access to a nurse at all times for urgent questions. Please call the main number to the clinic Dept: [phone] and follow the prompts.   For any non-urgent questions, you may also contact your provider using MyChart. We now offer e-Visits for anyone 18 and older to request care online for non-urgent symptoms. For details visit [email or web address].   Also download the MyChart app! Go to the app store, search "MyChart", open the app, select [facility], and log in with your MyChart username and password.  Due to Covid, a mask is required upon entering the hospital/clinic. If you do not have a mask, one will be given to you upon arrival. For doctor visits, patients may have 1 support person aged 18 or older with them. For treatment visits, patients cannot have anyone with them due to current Covid guidelines and our immunocompromised population.  

## 2021-11-30 ENCOUNTER — Telehealth: Payer: Self-pay | Admitting: Hematology

## 2021-11-30 NOTE — Telephone Encounter (Signed)
Left message with follow-up appointments per 4/20 los. ?

## 2021-12-04 ENCOUNTER — Other Ambulatory Visit: Payer: Self-pay | Admitting: Hematology

## 2021-12-05 ENCOUNTER — Encounter: Payer: Self-pay | Admitting: Nurse Practitioner

## 2021-12-06 ENCOUNTER — Other Ambulatory Visit: Payer: Self-pay

## 2021-12-06 ENCOUNTER — Inpatient Hospital Stay: Payer: Medicare Other

## 2021-12-06 ENCOUNTER — Other Ambulatory Visit: Payer: Self-pay | Admitting: Hematology

## 2021-12-06 ENCOUNTER — Inpatient Hospital Stay: Payer: Medicare Other | Admitting: Dietician

## 2021-12-06 VITALS — BP 129/80 | HR 87 | Temp 98.8°F | Resp 18 | Wt 206.8 lb

## 2021-12-06 DIAGNOSIS — Z171 Estrogen receptor negative status [ER-]: Secondary | ICD-10-CM

## 2021-12-06 DIAGNOSIS — Z7189 Other specified counseling: Secondary | ICD-10-CM

## 2021-12-06 DIAGNOSIS — Z5189 Encounter for other specified aftercare: Secondary | ICD-10-CM | POA: Diagnosis not present

## 2021-12-06 DIAGNOSIS — E1165 Type 2 diabetes mellitus with hyperglycemia: Secondary | ICD-10-CM | POA: Diagnosis not present

## 2021-12-06 DIAGNOSIS — I5042 Chronic combined systolic (congestive) and diastolic (congestive) heart failure: Secondary | ICD-10-CM | POA: Diagnosis not present

## 2021-12-06 DIAGNOSIS — Z452 Encounter for adjustment and management of vascular access device: Secondary | ICD-10-CM | POA: Diagnosis not present

## 2021-12-06 DIAGNOSIS — C50512 Malignant neoplasm of lower-outer quadrant of left female breast: Secondary | ICD-10-CM | POA: Diagnosis not present

## 2021-12-06 DIAGNOSIS — C7951 Secondary malignant neoplasm of bone: Secondary | ICD-10-CM | POA: Diagnosis not present

## 2021-12-06 DIAGNOSIS — E559 Vitamin D deficiency, unspecified: Secondary | ICD-10-CM | POA: Diagnosis not present

## 2021-12-06 DIAGNOSIS — Z95828 Presence of other vascular implants and grafts: Secondary | ICD-10-CM

## 2021-12-06 DIAGNOSIS — I110 Hypertensive heart disease with heart failure: Secondary | ICD-10-CM | POA: Diagnosis not present

## 2021-12-06 DIAGNOSIS — Z5112 Encounter for antineoplastic immunotherapy: Secondary | ICD-10-CM | POA: Diagnosis not present

## 2021-12-06 DIAGNOSIS — G629 Polyneuropathy, unspecified: Secondary | ICD-10-CM | POA: Diagnosis not present

## 2021-12-06 LAB — COMPREHENSIVE METABOLIC PANEL
ALT: 8 U/L (ref 0–44)
AST: 13 U/L — ABNORMAL LOW (ref 15–41)
Albumin: 3.8 g/dL (ref 3.5–5.0)
Alkaline Phosphatase: 91 U/L (ref 38–126)
Anion gap: 3 — ABNORMAL LOW (ref 5–15)
BUN: 8 mg/dL (ref 8–23)
CO2: 29 mmol/L (ref 22–32)
Calcium: 8.8 mg/dL — ABNORMAL LOW (ref 8.9–10.3)
Chloride: 110 mmol/L (ref 98–111)
Creatinine, Ser: 0.57 mg/dL (ref 0.44–1.00)
GFR, Estimated: >60 mL/min (ref >60)
Glucose, Bld: 99 mg/dL (ref 70–99)
Potassium: 3.7 mmol/L (ref 3.5–5.1)
Sodium: 142 mmol/L (ref 135–145)
Total Bilirubin: 0.4 mg/dL (ref 0.3–1.2)
Total Protein: 7.2 g/dL (ref 6.5–8.1)

## 2021-12-06 LAB — CBC WITH DIFFERENTIAL/PLATELET
Abs Immature Granulocytes: 0.03 10*3/uL (ref 0.00–0.07)
Basophils Absolute: 0 10*3/uL (ref 0.0–0.1)
Basophils Relative: 0 %
Eosinophils Absolute: 0.2 10*3/uL (ref 0.0–0.5)
Eosinophils Relative: 5 %
HCT: 29.7 % — ABNORMAL LOW (ref 36.0–46.0)
Hemoglobin: 9.6 g/dL — ABNORMAL LOW (ref 12.0–15.0)
Immature Granulocytes: 1 %
Lymphocytes Relative: 24 %
Lymphs Abs: 0.8 10*3/uL (ref 0.7–4.0)
MCH: 30.5 pg (ref 26.0–34.0)
MCHC: 32.3 g/dL (ref 30.0–36.0)
MCV: 94.3 fL (ref 80.0–100.0)
Monocytes Absolute: 0.4 10*3/uL (ref 0.1–1.0)
Monocytes Relative: 12 %
Neutro Abs: 1.9 10*3/uL (ref 1.7–7.7)
Neutrophils Relative %: 58 %
Platelets: 298 10*3/uL (ref 150–400)
RBC: 3.15 MIL/uL — ABNORMAL LOW (ref 3.87–5.11)
RDW: 13.5 % (ref 11.5–15.5)
WBC: 3.3 10*3/uL — ABNORMAL LOW (ref 4.0–10.5)
nRBC: 0 % (ref 0.0–0.2)

## 2021-12-06 MED ORDER — ACETAMINOPHEN 325 MG PO TABS
650.0000 mg | ORAL_TABLET | Freq: Once | ORAL | Status: AC
  Administered 2021-12-06: 650 mg via ORAL
  Filled 2021-12-06: qty 2

## 2021-12-06 MED ORDER — ALTEPLASE 2 MG IJ SOLR
2.0000 mg | Freq: Once | INTRAMUSCULAR | Status: AC | PRN
  Administered 2021-12-06: 2 mg
  Filled 2021-12-06: qty 2

## 2021-12-06 MED ORDER — FAMOTIDINE IN NACL 20-0.9 MG/50ML-% IV SOLN
20.0000 mg | Freq: Once | INTRAVENOUS | Status: AC
  Administered 2021-12-06: 20 mg via INTRAVENOUS
  Filled 2021-12-06: qty 50

## 2021-12-06 MED ORDER — ATROPINE SULFATE 1 MG/ML IV SOLN
0.5000 mg | Freq: Once | INTRAVENOUS | Status: AC | PRN
  Administered 2021-12-06: 0.5 mg via INTRAVENOUS
  Filled 2021-12-06: qty 1

## 2021-12-06 MED ORDER — DIPHENHYDRAMINE HCL 50 MG/ML IJ SOLN
50.0000 mg | Freq: Once | INTRAMUSCULAR | Status: AC
  Administered 2021-12-06: 50 mg via INTRAVENOUS
  Filled 2021-12-06: qty 1

## 2021-12-06 MED ORDER — SODIUM CHLORIDE 0.9% FLUSH
10.0000 mL | INTRAVENOUS | Status: DC | PRN
  Administered 2021-12-06: 10 mL via INTRAVENOUS

## 2021-12-06 MED ORDER — SODIUM CHLORIDE 0.9% FLUSH
10.0000 mL | INTRAVENOUS | Status: DC | PRN
  Administered 2021-12-06: 10 mL

## 2021-12-06 MED ORDER — SODIUM CHLORIDE 0.9 % IV SOLN
150.0000 mg | Freq: Once | INTRAVENOUS | Status: AC
  Administered 2021-12-06: 150 mg via INTRAVENOUS
  Filled 2021-12-06: qty 150

## 2021-12-06 MED ORDER — DEXAMETHASONE SODIUM PHOSPHATE 10 MG/ML IJ SOLN
5.0000 mg | Freq: Once | INTRAMUSCULAR | Status: AC
  Administered 2021-12-06: 5 mg via INTRAVENOUS
  Filled 2021-12-06: qty 1

## 2021-12-06 MED ORDER — HEPARIN SOD (PORK) LOCK FLUSH 100 UNIT/ML IV SOLN
500.0000 [IU] | Freq: Once | INTRAVENOUS | Status: AC | PRN
  Administered 2021-12-06: 500 [IU]

## 2021-12-06 MED ORDER — SODIUM CHLORIDE 0.9 % IV SOLN
7.0000 mg/kg | Freq: Once | INTRAVENOUS | Status: AC
  Administered 2021-12-06: 640 mg via INTRAVENOUS
  Filled 2021-12-06: qty 64

## 2021-12-06 MED ORDER — SODIUM CHLORIDE 0.9 % IV SOLN: Freq: Once | INTRAVENOUS | Status: AC

## 2021-12-06 MED ORDER — PALONOSETRON HCL INJECTION 0.25 MG/5ML
0.2500 mg | Freq: Once | INTRAVENOUS | Status: AC
  Administered 2021-12-06: 0.25 mg via INTRAVENOUS
  Filled 2021-12-06: qty 5

## 2021-12-06 NOTE — Progress Notes (Signed)
Pt stayed for 30 min post obs. Pt stable and stated she felt fine. Ambulatory with walking stick to lobby. ?

## 2021-12-06 NOTE — Progress Notes (Addendum)
Cancel cycle 7, day 10 - Ziextenzo orders. MD reduced Trodelvy dose.  ? ?Acquanetta Belling, [place], BCPS, BCOP ?12/06/2021 ?1:59 PM ? ?

## 2021-12-06 NOTE — Progress Notes (Signed)
Nutrition Follow-up: ? ?Patient with metastatic breast cancer. She is currently receiving Trodelvy.  ? ?Met with patient during infusion. She reports good appetite and intake. Patient reports recent adjustments to her diabetes medications. She has noticed an overall improvement in how she feels. Patient has been wearing Dexcom for the past couple of weeks. She reports her blood sugars sometimes increase while at rest. Patient shows current blood sugar in 230's. Reminded patient she ate at the cafeteria ~1 hour before visit (broccoli, potatoes, white bread, chicken). Patient denies nausea, vomiting, diarrhea, constipation.  ? ? ?Medications: reviewed ? ?Labs: reviewed ? ?Anthropometrics: Weight 206 lb 12 oz today decreased  ? ?4/20 - 208 lb 2 oz  ?4/17 - 211 lb 9.6 oz  ?4/05 - 203 lb 12 oz  ?3/30 - 211 lb 3.2 oz  ? ?- Suspect wt fluctuations r/t fluid ?Per med review - pt to take 40 mg Lasix for wt gain 3-4 lbs (updated 09/21/21 - Dr. Ellyn Hack) ? ?NUTRITION DIAGNOSIS: Inadequate oral intake stable  ? ? ?INTERVENTION:  ?Continue eating small frequent meals and snacks for weight maintenance ?Encouraged tracking meals in dexcom  ?  ? ?MONITORING, EVALUATION, GOAL: weight trends, intake  ? ? ?NEXT VISIT: to be scheduled as needed with treatment  ? ? ? ?

## 2021-12-06 NOTE — Patient Instructions (Signed)
[facility]  Discharge Instructions: Thank you for choosing [facility] to provide your oncology and hematology care.   If you have a lab appointment with the Cancer Center, please go directly to the Cancer Center and check in at the registration area.   Wear comfortable clothing and clothing appropriate for easy access to any Portacath or PICC line.   We strive to give you quality time with your provider. You may need to reschedule your appointment if you arrive late (15 or more minutes).  Arriving late affects you and other patients whose appointments are after yours.  Also, if you miss three or more appointments without notifying the office, you may be dismissed from the clinic at the provider's discretion.      For prescription refill requests, have your pharmacy contact our office and allow 72 hours for refills to be completed.    Today you received the following chemotherapy and/or immunotherapy agents: Trodelvy.       To help prevent nausea and vomiting after your treatment, we encourage you to take your nausea medication as directed.  BELOW ARE SYMPTOMS THAT SHOULD BE REPORTED IMMEDIATELY: *FEVER GREATER THAN 100.4 F (38 C) OR HIGHER *CHILLS OR SWEATING *NAUSEA AND VOMITING THAT IS NOT CONTROLLED WITH YOUR NAUSEA MEDICATION *UNUSUAL SHORTNESS OF BREATH *UNUSUAL BRUISING OR BLEEDING *URINARY PROBLEMS (pain or burning when urinating, or frequent urination) *BOWEL PROBLEMS (unusual diarrhea, constipation, pain near the anus) TENDERNESS IN MOUTH AND THROAT WITH OR WITHOUT PRESENCE OF ULCERS (sore throat, sores in mouth, or a toothache) UNUSUAL RASH, SWELLING OR PAIN  UNUSUAL VAGINAL DISCHARGE OR ITCHING   Items with * indicate a potential emergency and should be followed up as soon as possible or go to the Emergency Department if any problems should occur.  Please show the CHEMOTHERAPY ALERT CARD or IMMUNOTHERAPY ALERT CARD at check-in to  the Emergency Department and triage nurse.  Should you have questions after your visit or need to cancel or reschedule your appointment, please contact [facility]  Dept: [phone]  and follow the prompts.  Office hours are 8:00 a.m. to 4:30 p.m. Monday - Friday. Please note that voicemails left after 4:00 p.m. may not be returned until the following business day.  We are closed weekends and major holidays. You have access to a nurse at all times for urgent questions. Please call the main number to the clinic Dept: [phone] and follow the prompts.   For any non-urgent questions, you may also contact your provider using MyChart. We now offer e-Visits for anyone 18 and older to request care online for non-urgent symptoms. For details visit [email or web address].   Also download the MyChart app! Go to the app store, search "MyChart", open the app, select [place], and log in with your MyChart username and password.  Due to Covid, a mask is required upon entering the hospital/clinic. If you do not have a mask, one will be given to you upon arrival. For doctor visits, patients may have 1 support person aged 18 or older with them. For treatment visits, patients cannot have anyone with them due to current Covid guidelines and our immunocompromised population.  

## 2021-12-10 DIAGNOSIS — H209 Unspecified iridocyclitis: Secondary | ICD-10-CM | POA: Diagnosis not present

## 2021-12-10 DIAGNOSIS — H5010 Unspecified exotropia: Secondary | ICD-10-CM | POA: Diagnosis not present

## 2021-12-10 DIAGNOSIS — H3552 Pigmentary retinal dystrophy: Secondary | ICD-10-CM | POA: Diagnosis not present

## 2021-12-10 DIAGNOSIS — H179 Unspecified corneal scar and opacity: Secondary | ICD-10-CM | POA: Diagnosis not present

## 2021-12-10 DIAGNOSIS — E119 Type 2 diabetes mellitus without complications: Secondary | ICD-10-CM | POA: Diagnosis not present

## 2021-12-10 DIAGNOSIS — H2513 Age-related nuclear cataract, bilateral: Secondary | ICD-10-CM | POA: Diagnosis not present

## 2021-12-10 DIAGNOSIS — H401131 Primary open-angle glaucoma, bilateral, mild stage: Secondary | ICD-10-CM | POA: Diagnosis not present

## 2021-12-11 ENCOUNTER — Other Ambulatory Visit: Payer: Self-pay | Admitting: Hematology

## 2021-12-11 DIAGNOSIS — Z171 Estrogen receptor negative status [ER-]: Secondary | ICD-10-CM

## 2021-12-20 DIAGNOSIS — E1165 Type 2 diabetes mellitus with hyperglycemia: Secondary | ICD-10-CM | POA: Diagnosis not present

## 2021-12-20 DIAGNOSIS — I429 Cardiomyopathy, unspecified: Secondary | ICD-10-CM | POA: Diagnosis not present

## 2021-12-20 DIAGNOSIS — E7800 Pure hypercholesterolemia, unspecified: Secondary | ICD-10-CM | POA: Diagnosis not present

## 2021-12-20 DIAGNOSIS — G629 Polyneuropathy, unspecified: Secondary | ICD-10-CM | POA: Diagnosis not present

## 2021-12-21 ENCOUNTER — Inpatient Hospital Stay: Payer: Medicare Other | Attending: Hematology

## 2021-12-21 ENCOUNTER — Inpatient Hospital Stay (HOSPITAL_BASED_OUTPATIENT_CLINIC_OR_DEPARTMENT_OTHER): Payer: Medicare Other | Admitting: Hematology

## 2021-12-21 ENCOUNTER — Encounter: Payer: Self-pay | Admitting: Hematology

## 2021-12-21 ENCOUNTER — Inpatient Hospital Stay: Payer: Medicare Other

## 2021-12-21 ENCOUNTER — Other Ambulatory Visit: Payer: Self-pay

## 2021-12-21 VITALS — BP 142/84 | HR 88 | Temp 98.2°F | Resp 18 | Ht 67.0 in | Wt 210.2 lb

## 2021-12-21 DIAGNOSIS — Z171 Estrogen receptor negative status [ER-]: Secondary | ICD-10-CM

## 2021-12-21 DIAGNOSIS — C50512 Malignant neoplasm of lower-outer quadrant of left female breast: Secondary | ICD-10-CM

## 2021-12-21 DIAGNOSIS — Z794 Long term (current) use of insulin: Secondary | ICD-10-CM | POA: Insufficient documentation

## 2021-12-21 DIAGNOSIS — E559 Vitamin D deficiency, unspecified: Secondary | ICD-10-CM | POA: Diagnosis not present

## 2021-12-21 DIAGNOSIS — Z170 Estrogen receptor positive status [ER+]: Secondary | ICD-10-CM | POA: Diagnosis not present

## 2021-12-21 DIAGNOSIS — T451X5A Adverse effect of antineoplastic and immunosuppressive drugs, initial encounter: Secondary | ICD-10-CM | POA: Diagnosis not present

## 2021-12-21 DIAGNOSIS — Z95828 Presence of other vascular implants and grafts: Secondary | ICD-10-CM

## 2021-12-21 DIAGNOSIS — Z923 Personal history of irradiation: Secondary | ICD-10-CM | POA: Diagnosis not present

## 2021-12-21 DIAGNOSIS — I10 Essential (primary) hypertension: Secondary | ICD-10-CM | POA: Diagnosis not present

## 2021-12-21 DIAGNOSIS — Z7189 Other specified counseling: Secondary | ICD-10-CM

## 2021-12-21 DIAGNOSIS — E1165 Type 2 diabetes mellitus with hyperglycemia: Secondary | ICD-10-CM | POA: Diagnosis not present

## 2021-12-21 DIAGNOSIS — C7951 Secondary malignant neoplasm of bone: Secondary | ICD-10-CM | POA: Diagnosis not present

## 2021-12-21 DIAGNOSIS — Z9221 Personal history of antineoplastic chemotherapy: Secondary | ICD-10-CM | POA: Insufficient documentation

## 2021-12-21 DIAGNOSIS — Z5112 Encounter for antineoplastic immunotherapy: Secondary | ICD-10-CM | POA: Insufficient documentation

## 2021-12-21 DIAGNOSIS — G893 Neoplasm related pain (acute) (chronic): Secondary | ICD-10-CM | POA: Insufficient documentation

## 2021-12-21 DIAGNOSIS — C778 Secondary and unspecified malignant neoplasm of lymph nodes of multiple regions: Secondary | ICD-10-CM | POA: Insufficient documentation

## 2021-12-21 DIAGNOSIS — G620 Drug-induced polyneuropathy: Secondary | ICD-10-CM | POA: Diagnosis not present

## 2021-12-21 LAB — CBC WITH DIFFERENTIAL/PLATELET
Abs Immature Granulocytes: 0.02 10*3/uL (ref 0.00–0.07)
Basophils Absolute: 0 10*3/uL (ref 0.0–0.1)
Basophils Relative: 0 %
Eosinophils Absolute: 0.3 10*3/uL (ref 0.0–0.5)
Eosinophils Relative: 5 %
HCT: 31.1 % — ABNORMAL LOW (ref 36.0–46.0)
Hemoglobin: 10.2 g/dL — ABNORMAL LOW (ref 12.0–15.0)
Immature Granulocytes: 0 %
Lymphocytes Relative: 19 %
Lymphs Abs: 0.9 10*3/uL (ref 0.7–4.0)
MCH: 30.8 pg (ref 26.0–34.0)
MCHC: 32.8 g/dL (ref 30.0–36.0)
MCV: 94 fL (ref 80.0–100.0)
Monocytes Absolute: 0.6 10*3/uL (ref 0.1–1.0)
Monocytes Relative: 13 %
Neutro Abs: 3.2 10*3/uL (ref 1.7–7.7)
Neutrophils Relative %: 63 %
Platelets: 240 10*3/uL (ref 150–400)
RBC: 3.31 MIL/uL — ABNORMAL LOW (ref 3.87–5.11)
RDW: 13.8 % (ref 11.5–15.5)
WBC: 5 10*3/uL (ref 4.0–10.5)
nRBC: 0 % (ref 0.0–0.2)

## 2021-12-21 LAB — COMPREHENSIVE METABOLIC PANEL
ALT: 34 U/L (ref 0–44)
AST: 53 U/L — ABNORMAL HIGH (ref 15–41)
Albumin: 3.9 g/dL (ref 3.5–5.0)
Alkaline Phosphatase: 94 U/L (ref 38–126)
Anion gap: 6 (ref 5–15)
BUN: 12 mg/dL (ref 8–23)
CO2: 27 mmol/L (ref 22–32)
Calcium: 9.2 mg/dL (ref 8.9–10.3)
Chloride: 108 mmol/L (ref 98–111)
Creatinine, Ser: 0.69 mg/dL (ref 0.44–1.00)
GFR, Estimated: >60 mL/min (ref >60)
Glucose, Bld: 144 mg/dL — ABNORMAL HIGH (ref 70–99)
Potassium: 3.9 mmol/L (ref 3.5–5.1)
Sodium: 141 mmol/L (ref 135–145)
Total Bilirubin: 0.7 mg/dL (ref 0.3–1.2)
Total Protein: 7.4 g/dL (ref 6.5–8.1)

## 2021-12-21 MED ORDER — SODIUM CHLORIDE 0.9 % IV SOLN
150.0000 mg | Freq: Once | INTRAVENOUS | Status: AC
  Administered 2021-12-21: 150 mg via INTRAVENOUS
  Filled 2021-12-21: qty 150

## 2021-12-21 MED ORDER — SODIUM CHLORIDE 0.9% FLUSH
10.0000 mL | INTRAVENOUS | Status: DC | PRN
  Administered 2021-12-21: 10 mL via INTRAVENOUS

## 2021-12-21 MED ORDER — SODIUM CHLORIDE 0.9 % IV SOLN
7.0000 mg/kg | Freq: Once | INTRAVENOUS | Status: AC
  Administered 2021-12-21: 640 mg via INTRAVENOUS
  Filled 2021-12-21: qty 64

## 2021-12-21 MED ORDER — DIPHENHYDRAMINE HCL 50 MG/ML IJ SOLN
50.0000 mg | Freq: Once | INTRAMUSCULAR | Status: AC
  Administered 2021-12-21: 50 mg via INTRAVENOUS
  Filled 2021-12-21: qty 1

## 2021-12-21 MED ORDER — ONDANSETRON HCL 8 MG PO TABS: 8.0000 mg | ORAL_TABLET | Freq: Three times a day (TID) | ORAL | 1 refills | Status: DC | PRN

## 2021-12-21 MED ORDER — SODIUM CHLORIDE 0.9% FLUSH
10.0000 mL | INTRAVENOUS | Status: DC | PRN
  Administered 2021-12-21: 10 mL

## 2021-12-21 MED ORDER — DEXAMETHASONE SODIUM PHOSPHATE 10 MG/ML IJ SOLN
5.0000 mg | Freq: Once | INTRAMUSCULAR | Status: AC
  Administered 2021-12-21: 5 mg via INTRAVENOUS
  Filled 2021-12-21: qty 1

## 2021-12-21 MED ORDER — OXYCODONE HCL 5 MG PO TABS: 5.0000 mg | ORAL_TABLET | Freq: Three times a day (TID) | ORAL | 0 refills | Status: DC | PRN

## 2021-12-21 MED ORDER — ATROPINE SULFATE 1 MG/ML IV SOLN
0.5000 mg | Freq: Once | INTRAVENOUS | Status: AC | PRN
  Administered 2021-12-21: 0.5 mg via INTRAVENOUS
  Filled 2021-12-21: qty 1

## 2021-12-21 MED ORDER — HEPARIN SOD (PORK) LOCK FLUSH 100 UNIT/ML IV SOLN
500.0000 [IU] | Freq: Once | INTRAVENOUS | Status: AC | PRN
  Administered 2021-12-21: 500 [IU]

## 2021-12-21 MED ORDER — FAMOTIDINE IN NACL 20-0.9 MG/50ML-% IV SOLN
20.0000 mg | Freq: Once | INTRAVENOUS | Status: AC
  Administered 2021-12-21: 20 mg via INTRAVENOUS
  Filled 2021-12-21: qty 50

## 2021-12-21 MED ORDER — ACETAMINOPHEN 325 MG PO TABS
650.0000 mg | ORAL_TABLET | Freq: Once | ORAL | Status: AC
  Administered 2021-12-21: 650 mg via ORAL
  Filled 2021-12-21: qty 2

## 2021-12-21 MED ORDER — PALONOSETRON HCL INJECTION 0.25 MG/5ML
0.2500 mg | Freq: Once | INTRAVENOUS | Status: AC
  Administered 2021-12-21: 0.25 mg via INTRAVENOUS
  Filled 2021-12-21: qty 5

## 2021-12-21 MED ORDER — SODIUM CHLORIDE 0.9 % IV SOLN: Freq: Once | INTRAVENOUS | Status: AC

## 2021-12-21 NOTE — Progress Notes (Addendum)
?[facility]   ?[phone] [phone]   ?Clinic Follow up Note  ? ?Patient Care Team: ?Kristin Ward as PCP - General (Physician Assistant) ?Kristin Man, MD as PCP - Cardiology (Cardiology) ?Kristin Merle, MD as Consulting Physician (Hematology) ?Kristin Klein, MD as Consulting Physician (General Surgery) ?Kristin Ward, [facility] as Pharmacist (Pharmacist) ?Kristin Pi, MD as Referring Physician (Endocrinology) ? ?Date of Service:  12/21/2021 ? ?CHIEF COMPLAINT: f/u of metastatic breast cancer ? ?CURRENT THERAPY:  ?Kristin Ward, given on days 1 and 8 every 21 day cycle, starting 07/03/21 ? ?ASSESSMENT & PLAN:  ?Kristin Ward is a 66 y.o. female with  ? ?1. Left breast invasive ductal carcinoma, stage IB, p(T1cN0M0), Triple negative, Grade 3, in 2018, metastatic disease in 08/2020 ER 20% weakly + PR/HER2 (0) negative, PD-L1 0%, genetics (-) ?-diagnosed with triple negative breast cancer in 05/2017. She is s/p left breast lumpectomy, adjuvant chemo TC and Radiation.  ?-Genetic testing was negative for pathogenetic mutations.  ?-Due to new right gluteal pain, she underwent biopsy on 09/02/20 which showed metastatic carcinoma, consistent with breast primary. ER 20% positive, PR and HER2 negative.  ?-she received 4 cycles of Taxol 09/26/20 - 12/18/20, discontinued due to worsening neuropathy. She then took Xeloda from 01/01/21 - 06/2021. ?-she switched to [place] on 07/03/21 due to disease progression. She again experienced nausea as well as severe diarrhea after cycle 2, but was able to recover well. I reduced her dose from C4 and discontinued dexa. ?-she continues on trodlevy. Chemo was held in Feb 2023 due to her uncontrolled hyperglycemia.  ?-restaging CT CAP on 11/27/21 showed: stable right iliac wing lesion; increase in volume of inguinal lymph nodes, overall similar from prior and improved from 06/2021. ?-I discussed obtaining a biopsy of a groin lymph node to confirm  the presence of cancer. ?-Labs reviewed. Her BG is down to 144 today, hgb 10.2. Adequate to proceed with treatment today. Will await pathology results before deciding on changing treatment  ?-will reach out to rad/onc for palliative RT to right groin area if needed  ?  ?2. Diabetes and uncontrolled hyperglycemia ?-worsening hyperglycemia lately, probably related to dexa (before chemo) and Trodelvy  ?-her hyperglycemia is now better controlled on 25u Lantus and sliding scale insulin. ?  ?3. Right hip pain, Right iliac bone met, Vit D deficiency ?-She had radiating right hip pain since 2020 from right iliac wing mass ?-She completed Palliative radiation to right iliac 09/04/20 - 09/22/20 with Dr. Lisbeth Renshaw, and her pain is much improved.  ?-Given localized bone met, she started Zometa q68month on 11/20/20. Most recent dose 08/03/21 ?-vit D has remained low, most recently 20.86 on 08/17/21. ?  ?4. G2 peripheral Neuropathy  ?-S/p C2D8 Taxol she developed mild tingling in her hands and feet.  ?-She uses ice bags at home as needed.  ?-She continues to have moderate numbness in her hands and light tingling in her feet. She can take B12 or acupuncture.  ?-Taxol stopped after 12/18/20. Her neuropathy is improving.  ?-Overall stable ?  ?5. DM, HTN, COPD, retinitis pigmentosa with limited vision, Chronic combined systolic and diastolic heart failure, EF 45-50% ?-Continue to follow-up with PCP Dr LRedmond Schooland cardiologist Dr HEllyn Hack ?-she is legally blind ?  ?6. Goal of care discussion  ?-The patient understands the goal of care is palliative. ?-she is full code now  ?  ?  ?Plan ?-proceed with TIvette Loyaltoday ?-I refilled her zofran and oxycodone ?-referral  to IR for groin lymph node biopsy and repeat ER/PR/HER2 if it's metastatic breast cancer  ?-lab, flush, and Trodelvy in 3 and 4 weeks ?-f/u in 3 weeks or sooner after biopsy  ?-will discuss with rad/onc about palliative RT  ? ? ?No problem-specific Assessment & Plan notes found for this  encounter. ? ? ?SUMMARY OF ONCOLOGIC HISTORY: ?Oncology History Overview Note  ?Cancer Staging ?Malignant neoplasm of lower-outer quadrant of left breast of female, estrogen receptor negative ([place]) ?Staging form: Breast, AJCC 8th Edition ?- Clinical stage from 06/06/2017: Stage IB (cT1c, cN0, cM0, G3, ER-, PR-, HER2-) - Signed by Kristin Merle, MD on 06/15/2017 ?Nuclear grade: G3 ?Histologic grading system: 3 grade system ?- Pathologic stage from 06/26/2017: Stage IB (pT1c, pN0, cM0, G3, ER-, PR-, HER2-) - Signed by Kristin Merle, MD on 07/10/2017 ?Neoadjuvant therapy: No ?Nuclear grade: G3 ?Multigene prognostic tests performed: None ?Histologic grading system: 3 grade system ?Laterality: Left ? ? ?  ?Malignant neoplasm of lower-outer quadrant of left breast of female, estrogen receptor negative ([place])  ?05/30/2017 Mammogram  ? Diagnostic mammo and [place] ?IMPRESSION: ?1. Highly suspicious 1.4 cm mass in the slightly lower slightly ?outer left breast -tissue sampling recommended. ?2. Indeterminate 0.5 mm mass in the slightly lower slightly outer ?left breast -tissue sampling recommended. ?3. At least 2 left axillary lymph nodes with borderline cortical ?thickness. ? ?  ?06/06/2017 Initial Biopsy  ? Diagnosis ?1. Breast, left, needle core biopsy, 5:30 o'clock ?- INVASIVE DUCTAL CARCINOMA, G3 ?2. Lymph node, needle/core biopsy, left axillary ?- NO CARCINOMA IDENTIFIED IN ONE LYMPH NODE (0/1) ? ?  ?06/06/2017 Initial Diagnosis  ? Malignant neoplasm of lower-outer quadrant of left breast of female, estrogen receptor negative ([place]) ? ?  ?06/06/2017 Receptors her2  ? Estrogen Receptor: 0%, NEGATIVE ?Progesterone Receptor: 0%, NEGATIVE ?Proliferation Marker Ki67: 70% ? ?  ?06/26/2017 Surgery  ? LEFT BREAST LUMPECTOMY WITH RADIOACTIVE SEED AND SENTINEL LYMPH NODE BIOPSY ERAS  PATHWAY AND INSERTION PORT-A-CATH ?By Dr. Barry Dienes on 06/26/17  ? ?  ?06/26/2017 Pathology Results  ? Diagnosis 06/26/17  ?1. Breast, lumpectomy, Left ?- INVASIVE  DUCTAL CARCINOMA, GRADE III/III, SPANNING 1.2 CM. ?- THE SURGICAL RESECTION MARGINS ARE NEGATIVE FOR CARCINOMA. ?- SEE ONCOLOGY TABLE BELOW. ?2. Lymph node, sentinel, biopsy, Left axillary #1 ?- THERE IS NO EVIDENCE OF CARCINOMA IN 1 OF 1 LYMPH NODE (0/1). ?3. Lymph node, sentinel, biopsy, Left axillary #2 ?- THERE IS NO EVIDENCE OF CARCINOMA IN 1 OF 1 LYMPH NODE (0/1). ?4. Lymph node, sentinel, biopsy, Left axillary #3 ?- THERE IS NO EVIDENCE OF CARCINOMA IN 1 OF 1 LYMPH NODE (0/1). ? ? ?  ?07/25/2017 - 09/29/2017 Chemotherapy  ? Adjuvant cytoxan and docetaxel (TC) every 3 weekd for 4 cycles  ? ? ?  ?11/19/2017 - 12/17/2017 Radiation Therapy  ? Adjuvant breast radiation ?Left breast treated to 42.5 Gy with 17 fx of 2.5 Gy followed by a boost of 7.5 Gy with 3 fx of 2.5 Gy ?  ? ?  ?02/2019 Procedure  ? She had PAC removal in 02/2019.  ?  ?07/23/2020 Imaging  ? MRI Lumbar Spine 07/23/20 ?IMPRESSION: ?1. No significant disc herniation, spinal canal or neural foraminal ?stenosis at any level. ?2. Moderate facet degenerative changes at L3-4, L4-5 and L5-S1. ?3. Multiple enlarged retroperitoneal and right iliac lymph nodes. ?Recommend correlation with CT of the abdomen and pelvis with ?contrast. ?  ?08/15/2020 Imaging  ? CT AP 08/15/20  ?IMPRESSION: ?1. Right iliac and periaortic adenopathy is noted concerning  for ?metastatic disease or lymphoma. Also noted is abnormal soft tissue ?mass anterior and posterior to the right iliac wing concerning for ?malignancy or metastatic disease. MRI is recommended for further ?evaluation. ?2. Irregular lucency with sclerotic margins is seen involving the ?right iliac crest concerning for possible lytic lesion. MRI may be ?performed for further evaluation. ?3. Enlarged fibroid uterus. ?4. Small gallstone. ?5. Aortic atherosclerosis. ?  ?08/22/2020 Pathology Results  ? FINAL MICROSCOPIC DIAGNOSIS: 08/22/20 ? ?A. NEEDLE CORE, RIGHT GLUTEUS MINIMUMUS, BIOPSY:  ?-  Metastatic carcinoma  ?-  See comment   ? ?COMMENT:  ?By immunohistochemistry, the neoplastic cells are positive for  ?cytokeratin-7 and have weak patchy positivity for GATA3 but are negative for cytokeratin-20 and GCDFP.  The combined morp

## 2021-12-21 NOTE — Patient Instructions (Signed)
[facility]  Discharge Instructions: Thank you for choosing [facility] to provide your oncology and hematology care.   If you have a lab appointment with the Cancer Center, please go directly to the Cancer Center and check in at the registration area.   Wear comfortable clothing and clothing appropriate for easy access to any Portacath or PICC line.   We strive to give you quality time with your provider. You may need to reschedule your appointment if you arrive late (15 or more minutes).  Arriving late affects you and other patients whose appointments are after yours.  Also, if you miss three or more appointments without notifying the office, you may be dismissed from the clinic at the provider's discretion.      For prescription refill requests, have your pharmacy contact our office and allow 72 hours for refills to be completed.    Today you received the following chemotherapy and/or immunotherapy agents   To help prevent nausea and vomiting after your treatment, we encourage you to take your nausea medication as directed.  BELOW ARE SYMPTOMS THAT SHOULD BE REPORTED IMMEDIATELY: *FEVER GREATER THAN 100.4 F (38 C) OR HIGHER *CHILLS OR SWEATING *NAUSEA AND VOMITING THAT IS NOT CONTROLLED WITH YOUR NAUSEA MEDICATION *UNUSUAL SHORTNESS OF BREATH *UNUSUAL BRUISING OR BLEEDING *URINARY PROBLEMS (pain or burning when urinating, or frequent urination) *BOWEL PROBLEMS (unusual diarrhea, constipation, pain near the anus) TENDERNESS IN MOUTH AND THROAT WITH OR WITHOUT PRESENCE OF ULCERS (sore throat, sores in mouth, or a toothache) UNUSUAL RASH, SWELLING OR PAIN  UNUSUAL VAGINAL DISCHARGE OR ITCHING   Items with * indicate a potential emergency and should be followed up as soon as possible or go to the Emergency Department if any problems should occur.  Please show the CHEMOTHERAPY ALERT CARD or IMMUNOTHERAPY ALERT CARD at check-in to the Emergency  Department and triage nurse.  Should you have questions after your visit or need to cancel or reschedule your appointment, please contact [facility]  Dept: [phone]  and follow the prompts.  Office hours are 8:00 a.m. to 4:30 p.m. Monday - Friday. Please note that voicemails left after 4:00 p.m. may not be returned until the following business day.  We are closed weekends and major holidays. You have access to a nurse at all times for urgent questions. Please call the main number to the clinic Dept: [phone] and follow the prompts.   For any non-urgent questions, you may also contact your provider using MyChart. We now offer e-Visits for anyone 18 and older to request care online for non-urgent symptoms. For details visit [email or web address].   Also download the MyChart app! Go to the app store, search "MyChart", open the app, select [place], and log in with your MyChart username and password.  Due to Covid, a mask is required upon entering the hospital/clinic. If you do not have a mask, one will be given to you upon arrival. For doctor visits, patients may have 1 support person aged 18 or older with them. For treatment visits, patients cannot have anyone with them due to current Covid guidelines and our immunocompromised population.   

## 2021-12-22 LAB — CANCER ANTIGEN 27.29: CA 27.29: 54.2 U/mL — ABNORMAL HIGH (ref 0.0–38.6)

## 2021-12-24 ENCOUNTER — Telehealth: Payer: Self-pay | Admitting: Radiation Oncology

## 2021-12-24 NOTE — Telephone Encounter (Signed)
Called patient to schedule a consultation w. Alison. No answer, LVM for a return call.  

## 2021-12-24 NOTE — Progress Notes (Unsigned)
-----   Message -----  ?From: Markus Daft, MD  ?Sent: 12/21/2021   4:16 PM EDT  ?To: Roosvelt Maser  ?Subject: RE: US Biopsy                                  ? ?[facility] for US guided right inguinal lymph node biopsy.  ? ?[place]  ?

## 2021-12-25 ENCOUNTER — Telehealth: Payer: Self-pay | Admitting: Radiation Oncology

## 2021-12-25 NOTE — Telephone Encounter (Signed)
Called patient to schedule a consultation w. Alison. No answer, LVM for a return call.  

## 2021-12-26 ENCOUNTER — Telehealth: Payer: Self-pay | Admitting: Hematology

## 2021-12-26 MED FILL — Fosaprepitant Dimeglumine For IV Infusion 150 MG (Base Eq): INTRAVENOUS | Qty: 5 | Status: AC

## 2021-12-26 NOTE — Telephone Encounter (Signed)
Left message with follow-up appointments per 5/12 los. ?

## 2021-12-27 ENCOUNTER — Other Ambulatory Visit: Payer: Self-pay

## 2021-12-27 ENCOUNTER — Inpatient Hospital Stay: Payer: Medicare Other

## 2021-12-27 ENCOUNTER — Encounter: Payer: Self-pay | Admitting: Licensed Clinical Social Worker

## 2021-12-27 VITALS — BP 142/94 | HR 96 | Temp 99.6°F | Resp 18 | Wt 216.5 lb

## 2021-12-27 DIAGNOSIS — I10 Essential (primary) hypertension: Secondary | ICD-10-CM | POA: Diagnosis not present

## 2021-12-27 DIAGNOSIS — Z170 Estrogen receptor positive status [ER+]: Secondary | ICD-10-CM | POA: Diagnosis not present

## 2021-12-27 DIAGNOSIS — C778 Secondary and unspecified malignant neoplasm of lymph nodes of multiple regions: Secondary | ICD-10-CM | POA: Diagnosis not present

## 2021-12-27 DIAGNOSIS — Z171 Estrogen receptor negative status [ER-]: Secondary | ICD-10-CM

## 2021-12-27 DIAGNOSIS — E559 Vitamin D deficiency, unspecified: Secondary | ICD-10-CM | POA: Diagnosis not present

## 2021-12-27 DIAGNOSIS — C7951 Secondary malignant neoplasm of bone: Secondary | ICD-10-CM | POA: Diagnosis not present

## 2021-12-27 DIAGNOSIS — G893 Neoplasm related pain (acute) (chronic): Secondary | ICD-10-CM | POA: Diagnosis not present

## 2021-12-27 DIAGNOSIS — C50512 Malignant neoplasm of lower-outer quadrant of left female breast: Secondary | ICD-10-CM | POA: Diagnosis not present

## 2021-12-27 DIAGNOSIS — E1165 Type 2 diabetes mellitus with hyperglycemia: Secondary | ICD-10-CM | POA: Diagnosis not present

## 2021-12-27 DIAGNOSIS — T451X5A Adverse effect of antineoplastic and immunosuppressive drugs, initial encounter: Secondary | ICD-10-CM | POA: Diagnosis not present

## 2021-12-27 DIAGNOSIS — Z9221 Personal history of antineoplastic chemotherapy: Secondary | ICD-10-CM | POA: Diagnosis not present

## 2021-12-27 DIAGNOSIS — Z7189 Other specified counseling: Secondary | ICD-10-CM

## 2021-12-27 DIAGNOSIS — Z923 Personal history of irradiation: Secondary | ICD-10-CM | POA: Diagnosis not present

## 2021-12-27 DIAGNOSIS — G620 Drug-induced polyneuropathy: Secondary | ICD-10-CM | POA: Diagnosis not present

## 2021-12-27 DIAGNOSIS — Z95828 Presence of other vascular implants and grafts: Secondary | ICD-10-CM

## 2021-12-27 DIAGNOSIS — Z794 Long term (current) use of insulin: Secondary | ICD-10-CM | POA: Diagnosis not present

## 2021-12-27 DIAGNOSIS — Z5112 Encounter for antineoplastic immunotherapy: Secondary | ICD-10-CM | POA: Diagnosis not present

## 2021-12-27 LAB — COMPREHENSIVE METABOLIC PANEL
ALT: 12 U/L (ref 0–44)
AST: 12 U/L — ABNORMAL LOW (ref 15–41)
Albumin: 3.6 g/dL (ref 3.5–5.0)
Alkaline Phosphatase: 91 U/L (ref 38–126)
Anion gap: 4 — ABNORMAL LOW (ref 5–15)
BUN: 10 mg/dL (ref 8–23)
CO2: 27 mmol/L (ref 22–32)
Calcium: 9 mg/dL (ref 8.9–10.3)
Chloride: 107 mmol/L (ref 98–111)
Creatinine, Ser: 0.62 mg/dL (ref 0.44–1.00)
GFR, Estimated: >60 mL/min (ref >60)
Glucose, Bld: 105 mg/dL — ABNORMAL HIGH (ref 70–99)
Potassium: 3.9 mmol/L (ref 3.5–5.1)
Sodium: 138 mmol/L (ref 135–145)
Total Bilirubin: 0.4 mg/dL (ref 0.3–1.2)
Total Protein: 7.2 g/dL (ref 6.5–8.1)

## 2021-12-27 LAB — CBC WITH DIFFERENTIAL/PLATELET
Abs Immature Granulocytes: 0.02 10*3/uL (ref 0.00–0.07)
Basophils Absolute: 0 10*3/uL (ref 0.0–0.1)
Basophils Relative: 0 %
Eosinophils Absolute: 0.1 10*3/uL (ref 0.0–0.5)
Eosinophils Relative: 3 %
HCT: 29.1 % — ABNORMAL LOW (ref 36.0–46.0)
Hemoglobin: 9.7 g/dL — ABNORMAL LOW (ref 12.0–15.0)
Immature Granulocytes: 1 %
Lymphocytes Relative: 14 %
Lymphs Abs: 0.5 10*3/uL — ABNORMAL LOW (ref 0.7–4.0)
MCH: 30.4 pg (ref 26.0–34.0)
MCHC: 33.3 g/dL (ref 30.0–36.0)
MCV: 91.2 fL (ref 80.0–100.0)
Monocytes Absolute: 0.3 10*3/uL (ref 0.1–1.0)
Monocytes Relative: 7 %
Neutro Abs: 2.9 10*3/uL (ref 1.7–7.7)
Neutrophils Relative %: 75 %
Platelets: 233 10*3/uL (ref 150–400)
RBC: 3.19 MIL/uL — ABNORMAL LOW (ref 3.87–5.11)
RDW: 13 % (ref 11.5–15.5)
WBC: 3.8 10*3/uL — ABNORMAL LOW (ref 4.0–10.5)
nRBC: 0 % (ref 0.0–0.2)

## 2021-12-27 MED ORDER — FAMOTIDINE IN NACL 20-0.9 MG/50ML-% IV SOLN
20.0000 mg | Freq: Once | INTRAVENOUS | Status: AC
  Administered 2021-12-27: 20 mg via INTRAVENOUS
  Filled 2021-12-27: qty 50

## 2021-12-27 MED ORDER — SODIUM CHLORIDE 0.9% FLUSH
10.0000 mL | INTRAVENOUS | Status: AC | PRN
  Administered 2021-12-27: 10 mL

## 2021-12-27 MED ORDER — DEXAMETHASONE SODIUM PHOSPHATE 10 MG/ML IJ SOLN
5.0000 mg | Freq: Once | INTRAMUSCULAR | Status: AC
  Administered 2021-12-27: 5 mg via INTRAVENOUS
  Filled 2021-12-27: qty 1

## 2021-12-27 MED ORDER — SODIUM CHLORIDE 0.9 % IV SOLN
7.0000 mg/kg | Freq: Once | INTRAVENOUS | Status: AC
  Administered 2021-12-27: 640 mg via INTRAVENOUS
  Filled 2021-12-27: qty 64

## 2021-12-27 MED ORDER — ATROPINE SULFATE 1 MG/ML IV SOLN
0.5000 mg | Freq: Once | INTRAVENOUS | Status: AC | PRN
  Administered 2021-12-27: 0.5 mg via INTRAVENOUS
  Filled 2021-12-27: qty 1

## 2021-12-27 MED ORDER — PALONOSETRON HCL INJECTION 0.25 MG/5ML
0.2500 mg | Freq: Once | INTRAVENOUS | Status: AC
  Administered 2021-12-27: 0.25 mg via INTRAVENOUS
  Filled 2021-12-27: qty 5

## 2021-12-27 MED ORDER — SODIUM CHLORIDE 0.9 % IV SOLN: Freq: Once | INTRAVENOUS | Status: AC

## 2021-12-27 MED ORDER — SODIUM CHLORIDE 0.9% FLUSH
10.0000 mL | INTRAVENOUS | Status: DC | PRN
  Administered 2021-12-27: 10 mL

## 2021-12-27 MED ORDER — DIPHENHYDRAMINE HCL 50 MG/ML IJ SOLN
50.0000 mg | Freq: Once | INTRAMUSCULAR | Status: AC
  Administered 2021-12-27: 50 mg via INTRAVENOUS
  Filled 2021-12-27: qty 1

## 2021-12-27 MED ORDER — HEPARIN SOD (PORK) LOCK FLUSH 100 UNIT/ML IV SOLN
500.0000 [IU] | Freq: Once | INTRAVENOUS | Status: AC | PRN
  Administered 2021-12-27: 500 [IU]

## 2021-12-27 MED ORDER — ACETAMINOPHEN 325 MG PO TABS
650.0000 mg | ORAL_TABLET | Freq: Once | ORAL | Status: AC
  Administered 2021-12-27: 650 mg via ORAL
  Filled 2021-12-27: qty 2

## 2021-12-27 MED ORDER — SODIUM CHLORIDE 0.9 % IV SOLN
150.0000 mg | Freq: Once | INTRAVENOUS | Status: AC
  Administered 2021-12-27: 150 mg via INTRAVENOUS
  Filled 2021-12-27: qty 150

## 2021-12-27 NOTE — Patient Instructions (Signed)
[facility]  Discharge Instructions: Thank you for choosing [facility] to provide your oncology and hematology care.   If you have a lab appointment with the Cancer Center, please go directly to the Cancer Center and check in at the registration area.   Wear comfortable clothing and clothing appropriate for easy access to any Portacath or PICC line.   We strive to give you quality time with your provider. You may need to reschedule your appointment if you arrive late (15 or more minutes).  Arriving late affects you and other patients whose appointments are after yours.  Also, if you miss three or more appointments without notifying the office, you may be dismissed from the clinic at the provider's discretion.      For prescription refill requests, have your pharmacy contact our office and allow 72 hours for refills to be completed.    Today you received the following chemotherapy and/or immunotherapy agents: Trodelvy.       To help prevent nausea and vomiting after your treatment, we encourage you to take your nausea medication as directed.  BELOW ARE SYMPTOMS THAT SHOULD BE REPORTED IMMEDIATELY: *FEVER GREATER THAN 100.4 F (38 C) OR HIGHER *CHILLS OR SWEATING *NAUSEA AND VOMITING THAT IS NOT CONTROLLED WITH YOUR NAUSEA MEDICATION *UNUSUAL SHORTNESS OF BREATH *UNUSUAL BRUISING OR BLEEDING *URINARY PROBLEMS (pain or burning when urinating, or frequent urination) *BOWEL PROBLEMS (unusual diarrhea, constipation, pain near the anus) TENDERNESS IN MOUTH AND THROAT WITH OR WITHOUT PRESENCE OF ULCERS (sore throat, sores in mouth, or a toothache) UNUSUAL RASH, SWELLING OR PAIN  UNUSUAL VAGINAL DISCHARGE OR ITCHING   Items with * indicate a potential emergency and should be followed up as soon as possible or go to the Emergency Department if any problems should occur.  Please show the CHEMOTHERAPY ALERT CARD or IMMUNOTHERAPY ALERT CARD at check-in to  the Emergency Department and triage nurse.  Should you have questions after your visit or need to cancel or reschedule your appointment, please contact [facility]  Dept: [phone]  and follow the prompts.  Office hours are 8:00 a.m. to 4:30 p.m. Monday - Friday. Please note that voicemails left after 4:00 p.m. may not be returned until the following business day.  We are closed weekends and major holidays. You have access to a nurse at all times for urgent questions. Please call the main number to the clinic Dept: [phone] and follow the prompts.   For any non-urgent questions, you may also contact your provider using MyChart. We now offer e-Visits for anyone 18 and older to request care online for non-urgent symptoms. For details visit [email or web address].   Also download the MyChart app! Go to the app store, search "MyChart", open the app, select [place], and log in with your MyChart username and password.  Due to Covid, a mask is required upon entering the hospital/clinic. If you do not have a mask, one will be given to you upon arrival. For doctor visits, patients may have 1 support person aged 18 or older with them. For treatment visits, patients cannot have anyone with them due to current Covid guidelines and our immunocompromised population.  

## 2021-12-27 NOTE — Progress Notes (Signed)
[facility] CSW Progress Note  Clinical Education officer, museum  received supportive documentation  to submit [place] [employer] [place] application.  Application faxed w/ confirmation of receipt received.      Henriette Combs, LCSW

## 2021-12-31 ENCOUNTER — Telehealth: Payer: Self-pay

## 2021-12-31 NOTE — Telephone Encounter (Signed)
Reconsult appointment reminder. I left a voicemail reminding patient of her 9:00am-01/01/22 in-person appointment w/ Shona Simpson PA-C. I advised patient to arrive 21mn early for check-in. I left my extension [phone] case patient needs anything.

## 2022-01-01 ENCOUNTER — Encounter: Payer: Self-pay | Admitting: Radiation Oncology

## 2022-01-01 ENCOUNTER — Ambulatory Visit
Admission: RE | Admit: 2022-01-01 | Discharge: 2022-01-01 | Disposition: A | Discharge status: Home / Self Care | Payer: Medicare Other | Source: Ambulatory Visit | Attending: Radiation Oncology | Admitting: Radiation Oncology

## 2022-01-01 ENCOUNTER — Other Ambulatory Visit: Payer: Self-pay

## 2022-01-01 VITALS — BP 140/92 | HR 85 | Temp 97.6°F | Resp 20 | Ht 67.25 in | Wt 212.0 lb

## 2022-01-01 DIAGNOSIS — E785 Hyperlipidemia, unspecified: Secondary | ICD-10-CM | POA: Diagnosis not present

## 2022-01-01 DIAGNOSIS — E119 Type 2 diabetes mellitus without complications: Secondary | ICD-10-CM | POA: Diagnosis not present

## 2022-01-01 DIAGNOSIS — C50912 Malignant neoplasm of unspecified site of left female breast: Secondary | ICD-10-CM | POA: Diagnosis not present

## 2022-01-01 DIAGNOSIS — Z9221 Personal history of antineoplastic chemotherapy: Secondary | ICD-10-CM | POA: Diagnosis not present

## 2022-01-01 DIAGNOSIS — Z79899 Other long term (current) drug therapy: Secondary | ICD-10-CM | POA: Insufficient documentation

## 2022-01-01 DIAGNOSIS — C50512 Malignant neoplasm of lower-outer quadrant of left female breast: Secondary | ICD-10-CM | POA: Diagnosis not present

## 2022-01-01 DIAGNOSIS — Z87891 Personal history of nicotine dependence: Secondary | ICD-10-CM | POA: Diagnosis not present

## 2022-01-01 DIAGNOSIS — Z794 Long term (current) use of insulin: Secondary | ICD-10-CM | POA: Insufficient documentation

## 2022-01-01 DIAGNOSIS — I10 Essential (primary) hypertension: Secondary | ICD-10-CM | POA: Diagnosis not present

## 2022-01-01 DIAGNOSIS — C7951 Secondary malignant neoplasm of bone: Secondary | ICD-10-CM | POA: Insufficient documentation

## 2022-01-01 DIAGNOSIS — Z803 Family history of malignant neoplasm of breast: Secondary | ICD-10-CM | POA: Insufficient documentation

## 2022-01-01 DIAGNOSIS — Z7982 Long term (current) use of aspirin: Secondary | ICD-10-CM | POA: Insufficient documentation

## 2022-01-01 DIAGNOSIS — I2510 Atherosclerotic heart disease of native coronary artery without angina pectoris: Secondary | ICD-10-CM | POA: Insufficient documentation

## 2022-01-01 DIAGNOSIS — I420 Dilated cardiomyopathy: Secondary | ICD-10-CM | POA: Diagnosis not present

## 2022-01-01 DIAGNOSIS — R918 Other nonspecific abnormal finding of lung field: Secondary | ICD-10-CM | POA: Insufficient documentation

## 2022-01-01 DIAGNOSIS — Z923 Personal history of irradiation: Secondary | ICD-10-CM | POA: Insufficient documentation

## 2022-01-01 DIAGNOSIS — I890 Lymphedema, not elsewhere classified: Secondary | ICD-10-CM | POA: Insufficient documentation

## 2022-01-01 DIAGNOSIS — Z800 Family history of malignant neoplasm of digestive organs: Secondary | ICD-10-CM | POA: Insufficient documentation

## 2022-01-01 DIAGNOSIS — C774 Secondary and unspecified malignant neoplasm of inguinal and lower limb lymph nodes: Secondary | ICD-10-CM | POA: Insufficient documentation

## 2022-01-01 DIAGNOSIS — H3552 Pigmentary retinal dystrophy: Secondary | ICD-10-CM | POA: Insufficient documentation

## 2022-01-01 DIAGNOSIS — Z171 Estrogen receptor negative status [ER-]: Secondary | ICD-10-CM | POA: Insufficient documentation

## 2022-01-01 DIAGNOSIS — D649 Anemia, unspecified: Secondary | ICD-10-CM | POA: Diagnosis not present

## 2022-01-01 DIAGNOSIS — Z7984 Long term (current) use of oral hypoglycemic drugs: Secondary | ICD-10-CM | POA: Diagnosis not present

## 2022-01-01 NOTE — Progress Notes (Signed)
Reconsult appointment. I verified patient's identity and began nursing interview. Patient reports RT groin/ leg pain 7/10. No other issues reported at this time.  Meaningful use complete.  BP (!) 140/92 (BP Location: Right Arm, Patient Position: Sitting, Cuff Size: Normal)   Pulse 85   Temp 97.6 F (36.4 C) (Temporal)   Resp 20   Ht 5' 7.25" (1.708 m)   Wt 212 lb (96.2 kg)   SpO2 99%   BMI 32.96 kg/m

## 2022-01-01 NOTE — Progress Notes (Signed)
Radiation Oncology         [phone] ________________________________  Name: Kristin Ward        MRN: [id number]  Date of Service: 01/01/2022 DOB: 12-24-1955  VE:[id number], Mare Ferrari, Joline Maxcy, MD     REFERRING PHYSICIAN: [name], MD   DIAGNOSIS: The encounter diagnosis was Malignant neoplasm metastatic to bone [facility]).   HISTORY OF PRESENT ILLNESS: Kristin Ward is a 66 y.o. 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 She underwent left lumpectomy with sentinel node biopsy on 06/26/17 revealing a grade 3,  1.2 cm invasive ductal carcinoma with all margins negative for disease, and no involvement of the three sampled notes. She went on to begin adjuvant chemotherapy on 07/25/17, and her last treatment was on 09/29/17 to complete 4 cycles of taxotere/cytoxan. She completed adjuvant radiotherapy and has been followed in surveillance.   She was diagnosed with recurrent disease in the right iliac wing and a biopsy on 08/22/20 showed metastatic carcinoma consistent with breast carcinoma, her prognostic markers confirmed ER positivity at 20% with weak staining, PR negative. She received palliative radiotherapy to her right hip/iliac region and completed this with [place] in February 2022.  The scan on 11/27/2021 measured an increase in the volume in the right inguinal chain is calling out to specific lymph nodes measuring each at 14 mm, a common iliac node was noted to increase to 10 mm from 7 and prior imaging from February.  Given the symptoms and disease that the patient is having to navigate, she is seen to consider palliative radiotherapy to the right groin.  PREVIOUS RADIATION THERAPY:   09/04/2020 through 09/22/2020 Site Technique Total Dose (Gy) Dose per Fx (Gy) Completed Fx Beam Energies  Pelvis: Pelvis 3D 37.5/37.5 2.5 15/15 15X    11/19/17 - 12/17/17: Left breast treated to 42.56 Gy with 17 fx of 2.5 Gy  followed by a boost of 7.5 Gy with 3 fx of 2.5 Gy  PAST MEDICAL HISTORY:  Past Medical History:  Diagnosis Date   Abnormal x-ray of lungs with single pulmonary nodule 03/15/2016   per records from [place]    Anemia    Asthma    Cholelithiasis 05/19/2017   On CT   Coronary artery calcification seen on CAT scan 05/19/2017   Diabetes mellitus without complication ([place])    type 2   Diabetes mellitus, new onset ([place]) 01/20/2019   Dilated idiopathic cardiomyopathy ([place]) 12/2015   EF 15-20%. Diagnosed in [facility]   Headache    NONE RECENT   Hypertension    Lymph nodes enlarged 07/25/2020   Malignant neoplasm of lower-outer quadrant of left breast of female, estrogen receptor negative ([place]) 06/11/2017   left breast   Mixed hyperlipidemia 06/17/2018   partially blind    Personal history of chemotherapy 2018-2019   Personal history of radiation therapy 2019   Retinitis pigmentosa    Retroperitoneal lymphadenopathy 07/25/2020   Uveitis        PAST SURGICAL HISTORY: Past Surgical History:  Procedure Laterality Date   brain cyst removed  2007   to help with headaches per patient , aspirated    BREAST BIOPSY Left 06/06/2017   x2   BREAST CYST ASPIRATION Left 06/06/2017   BREAST LUMPECTOMY Left 06/26/2017   BREAST LUMPECTOMY WITH RADIOACTIVE SEED AND SENTINEL LYMPH NODE BIOPSY Left 06/26/2017   Procedure: BREAST LUMPECTOMY WITH RADIOACTIVE SEED AND SENTINEL LYMPH  NODE BIOPSY ERAS  PATHWAY;  Surgeon: [name], MD;  Location: [place];  Service: General;  Laterality: Left;  pec block   FINE NEEDLE ASPIRATION Left 02/23/2019   Procedure: FINE NEEDLE ASPIRATION;  Surgeon: [name], MD;  Location: [place];  Service: General;  Laterality: Left;  Asiration of left breast seroma    IR IMAGING GUIDED PORT INSERTION  10/13/2020   NASAL TURBINATE REDUCTION     PORT-A-CATH REMOVAL Right 02/23/2019   Procedure: REMOVAL PORT-A-CATH;  Surgeon: [name],  MD;  Location: [place];  Service: General;  Laterality: Right;   PORTACATH PLACEMENT N/A 06/26/2017   Procedure: INSERTION PORT-A-CATH;  Surgeon: [name], MD;  Location: [place];  Service: General;  Laterality: N/A;   TRANSTHORACIC ECHOCARDIOGRAM  12/2015   A) [facility] May 2017: Mild concentric LVH. Global hypokinesis. GR daily. EF 18% severe LA dilation. Mitral annular dilatation with papillary muscle dysfunction and moderate MR. Dilated IVC consistent with elevated RAP.   TRANSTHORACIC ECHOCARDIOGRAM  05/2017; 08/2017   a) Mild concentric LVH.  EF 45 to 50% with diffuse hypokinesis.  GR 1 DD.  Mild aortic root dilation.;; b)  Mild LVH EF 45 to 50%.  Diffuse HK.  No significant valve disease.  No significant change.     FAMILY HISTORY:  Family History  Problem Relation Age of Onset   Colon cancer Mother 68       deceased 38   Heart attack Father    Breast cancer Other 67       2nd cousin on maternal side; currently 17   Pancreatic cancer Other        2nd cousin once removed on maternal side; deceased 22s   Fibromyalgia Sister    Esophageal cancer Neg Hx    Rectal cancer Neg Hx    Stomach cancer Neg Hx      SOCIAL HISTORY:  reports that she quit smoking about 6 years ago. Her smoking use included cigarettes. She has a 5.75 pack-year smoking history. She has never used smokeless tobacco. She reports that she does not drink alcohol and does not use drugs. The patient is widowed and lives in [place]. She is accompanied by a friend . 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    ALLERGIES: Morphine and related, Latex, Tylenol with codeine #3 [acetaminophen-codeine], and Penicillins   MEDICATIONS:  Current Outpatient Medications  Medication Sig Dispense Refill   Accu-Chek FastClix Lancets MISC TEST TWICE A DAY. PT USES AN ACCU-CHEK GUIDE ME METER 102 each 2   aspirin 81 MG chewable tablet Chew 81 mg  by mouth daily.     atorvastatin (LIPITOR) 20 MG tablet Take 1 tablet (20 mg total) by mouth daily. 90 tablet 3   brimonidine (ALPHAGAN) 0.2 % ophthalmic solution Place 1 drop into both eyes 2 (two) times daily.     carvedilol (COREG) 12.5 MG tablet Take 1 tablet (12.5 mg total) by mouth 2 (two) times daily with a meal. 180 tablet 1   Cholecalciferol (D3-1000 PO) Take 2 tablets by mouth daily.     Continuous Blood Gluc Receiver (DEXCOM G7 RECEIVER) DEVI 1 Device by Does not apply route 2 (two) times daily. 1 each 1   Continuous Blood Gluc Sensor (DEXCOM G7 SENSOR) MISC 1 Units by Does not apply route 2 (two) times daily. 3 each 11   furosemide (LASIX) 20 MG tablet TAKE 2 TABLETS (  40 MG TOTAL) BY MOUTH AS NEEDED. FOR WEIGHT GAIN 3 TO 4 LBS. 90 tablet 3   ibuprofen (ADVIL) 800 MG tablet Take 1 tablet (800 mg total) by mouth every 8 (eight) hours as needed. 30 tablet 2   insulin glargine (LANTUS SOLOSTAR) 100 UNIT/ML Solostar Pen Inject 15 Units into the skin at bedtime. 15 mL 5   insulin lispro protamine-lispro (HUMALOG 50/50 MIX) (50-50) 100 UNIT/ML SUSP injection Inject into the skin 2 (two) times daily before a meal.     Lancets Misc. (ACCU-CHEK SOFTCLIX LANCET DEV) KIT 1 Units by Does not apply route 2 (two) times daily. Check FSBS BID - Include strips # 50 with 5 refills, Lancets #50 with 5 refills DX: Type 2 DM - ICD 10: E11.9 1 kit 0   lidocaine-prilocaine (EMLA) cream Apply 1 application topically as needed. 30 g 0   linaclotide (LINZESS) 72 MCG capsule Take 1 capsule (72 mcg total) by mouth daily before breakfast. MUST HAVE APPOINTMENT FOR REFILLS 30 capsule 1   metFORMIN (GLUCOPHAGE) 1000 MG tablet Take 1 tablet (1,000 mg total) by mouth 2 (two) times daily with a meal. 180 tablet 3   Multiple Vitamins-Minerals (WOMENS MULTI PO) Take by mouth.     ondansetron (ZOFRAN) 8 MG tablet Take 1 tablet (8 mg total) by mouth every 8 (eight) hours as needed for nausea or vomiting (begin on day 3 after  chemo). 30 tablet 1   oxyCODONE (OXY IR/ROXICODONE) 5 MG immediate release tablet Take 1 tablet (5 mg total) by mouth every 8 (eight) hours as needed for severe pain. 60 tablet 0   oxyCODONE-acetaminophen (PERCOCET/ROXICET) 5-325 MG tablet      pantoprazole (PROTONIX) 40 MG tablet TAKE 1 TABLET BY MOUTH EVERY DAY 90 tablet 1   prochlorperazine (COMPAZINE) 10 MG tablet Take 1 tablet (10 mg total) by mouth every 6 (six) hours as needed (Nausea or vomiting). 30 tablet 1   sacubitril-valsartan (ENTRESTO) 97-103 MG Take 1 tablet by mouth 2 (two) times daily. 180 tablet 3   Semaglutide (RYBELSUS) 7 MG TABS Take 7 mg by mouth daily. 90 tablet 3   spironolactone (ALDACTONE) 25 MG tablet Take 1 tablet (25 mg total) by mouth daily. (Patient not taking: Reported on 10/12/2021) 90 tablet 3   vitamin B-12 (CYANOCOBALAMIN) 500 MCG tablet Take 1,000 mcg by mouth daily.     vitamin C (ASCORBIC ACID) 500 MG tablet Take 500 mg by mouth in the morning, at noon, in the evening, and at bedtime.     zinc gluconate 50 MG tablet Take 50 mg by mouth daily.     Current Facility-Administered Medications  Medication Dose Route Frequency Provider Last Rate Last Admin   insulin glulisine (APIDRA) injection 6 Units  6 Units Subcutaneous Once Francis Gaines B, PA-C         REVIEW OF SYSTEMS: On review of systems, the patient reports that she is doing okay. Her retinitis has progressed and she is now using a cane. She is having pain in her right groin and fullness of her right thigh, and tight edema of her lower right leg below the knee. No shortness of breath or chest pain is noted. No other complaints are verbalized. She has an upcoming trip from 6[place]6/17 that she would like to be able to go on.     PHYSICAL EXAM:  Wt Readings from Last 3 Encounters:  01/01/22 212 lb (96.2 kg)  12/27/21 216 lb 8 oz (98.2 kg)  12/21/21 210  lb 3.2 oz (95.3 kg)   Temp Readings from Last 3 Encounters:  01/01/22 97.6 F (36.4 C) (Temporal)   12/27/21 99.6 F (37.6 C) (Oral)  12/21/21 98.2 F (36.8 C) (Oral)   BP Readings from Last 3 Encounters:  01/01/22 (!) 140/92  12/27/21 (!) 142/94  12/21/21 (!) 142/84   Pulse Readings from Last 3 Encounters:  01/01/22 85  12/27/21 96  12/21/21 88   In general this is a well appearing African American female in no acute distress. She's alert and oriented x4 and appropriate throughout the examination. Cardiopulmonary assessment is negative for acute distress and she exhibits normal effort. The right thigh is about 1 1/2 times the size of her left, and she has 1-2 + pitting edema from the dorsal right foot to the level of the patella. No deep calf tenderness is noted. No edema is noted of the LLE.      ECOG = 1  0 - Asymptomatic (Fully active, able to carry on all predisease activities without restriction)  1 - Symptomatic but completely ambulatory (Restricted in physically strenuous activity but ambulatory and able to carry out work of a light or sedentary nature. For example, light housework, office work)  2 - Symptomatic, <50% in bed during the day (Ambulatory and capable of all self care but unable to carry out any work activities. Up and about more than 50% of waking hours)  3 - Symptomatic, >50% in bed, but not bedbound (Capable of only limited self-care, confined to bed or chair 50% or more of waking hours)  4 - Bedbound (Completely disabled. Cannot carry on any self-care. Totally confined to bed or chair)  5 - Death   Eustace Pen MM, Creech RH, Tormey DC, et al. [phone]). "Toxicity and response criteria of the [facility] Group". [place] Oncol. 5 (6): 649-55    LABORATORY DATA:  Lab Results  Component Value Date   WBC 3.8 (L) 12/27/2021   HGB 9.7 (L) 12/27/2021   HCT 29.1 (L) 12/27/2021   MCV 91.2 12/27/2021   PLT 233 12/27/2021   Lab Results  Component Value Date   NA 138 12/27/2021   K 3.9 12/27/2021   CL 107 12/27/2021   CO2 27 12/27/2021    Lab Results  Component Value Date   ALT 12 12/27/2021   AST 12 (L) 12/27/2021   ALKPHOS 91 12/27/2021   BILITOT 0.4 12/27/2021      RADIOGRAPHY: No results found.      IMPRESSION/PLAN: 1. 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 Dr. Lisbeth Renshaw discusses the pathology findings and reviews the nature of lymphatic disease.  We discussed the risks, benefits, short, and long term effects of radiotherapy, as well as the curative intent, and the patient is interested in proceeding. Dr. Lisbeth Renshaw discusses the delivery and logistics of radiotherapy and anticipates a course of 2 weeks of radiotherapy provided she is on hold with systemic therapy, otherwise it would need to be 3 weeks. Written consent is obtained and placed in the chart, a copy was provided to the patient. The patient will be contacted to coordinate treatment planning by our simulation department. We will coordinate with medical oncology regarding plans of systemic therapy. We will coordinate simulation prior to her upcoming trip and expect to start her radiation the week of 01/28/22. 2. RLE Lymphedema secondary to nodal disease in the right groin. I will send her  to PT for evaluation and appreciate their assessment and management as she is also aware of the long term risks of lymphedema from her upcoming radiation.    In a visit lasting 45 minutes, greater than 50% of the time was spent face to face discussing the patient's condition, in preparation for the discussion, and coordinating the patient's care.  The above documentation reflects my direct findings during this shared patient visit. Please see the separate note by Dr. Lisbeth Renshaw on this date for the remainder of the patient's plan of care.    Carola Rhine, PAC

## 2022-01-01 NOTE — Addendum Note (Signed)
Encounter addended by: Mollie Germany, LPN on: 3/43/7357 8:97 AM  Actions taken: Visit diagnoses modified

## 2022-01-03 DIAGNOSIS — C7951 Secondary malignant neoplasm of bone: Secondary | ICD-10-CM | POA: Diagnosis not present

## 2022-01-03 DIAGNOSIS — C50912 Malignant neoplasm of unspecified site of left female breast: Secondary | ICD-10-CM | POA: Diagnosis not present

## 2022-01-03 DIAGNOSIS — C774 Secondary and unspecified malignant neoplasm of inguinal and lower limb lymph nodes: Secondary | ICD-10-CM | POA: Diagnosis not present

## 2022-01-10 MED FILL — Fosaprepitant Dimeglumine For IV Infusion 150 MG (Base Eq): INTRAVENOUS | Qty: 5 | Status: AC

## 2022-01-11 ENCOUNTER — Other Ambulatory Visit: Payer: Self-pay

## 2022-01-11 ENCOUNTER — Encounter: Payer: Self-pay | Admitting: Hematology and Oncology

## 2022-01-11 ENCOUNTER — Inpatient Hospital Stay: Payer: Medicare Other | Attending: Hematology

## 2022-01-11 ENCOUNTER — Inpatient Hospital Stay: Payer: Medicare Other

## 2022-01-11 ENCOUNTER — Inpatient Hospital Stay (HOSPITAL_BASED_OUTPATIENT_CLINIC_OR_DEPARTMENT_OTHER): Payer: Medicare Other | Admitting: Hematology and Oncology

## 2022-01-11 VITALS — BP 157/92 | HR 102 | Temp 97.9°F | Resp 18 | Ht 67.0 in | Wt 216.5 lb

## 2022-01-11 VITALS — HR 97

## 2022-01-11 DIAGNOSIS — Z7189 Other specified counseling: Secondary | ICD-10-CM

## 2022-01-11 DIAGNOSIS — G629 Polyneuropathy, unspecified: Secondary | ICD-10-CM | POA: Diagnosis not present

## 2022-01-11 DIAGNOSIS — Z452 Encounter for adjustment and management of vascular access device: Secondary | ICD-10-CM | POA: Insufficient documentation

## 2022-01-11 DIAGNOSIS — C50512 Malignant neoplasm of lower-outer quadrant of left female breast: Secondary | ICD-10-CM | POA: Insufficient documentation

## 2022-01-11 DIAGNOSIS — R739 Hyperglycemia, unspecified: Secondary | ICD-10-CM | POA: Diagnosis not present

## 2022-01-11 DIAGNOSIS — C7951 Secondary malignant neoplasm of bone: Secondary | ICD-10-CM | POA: Diagnosis not present

## 2022-01-11 DIAGNOSIS — J449 Chronic obstructive pulmonary disease, unspecified: Secondary | ICD-10-CM | POA: Diagnosis not present

## 2022-01-11 DIAGNOSIS — G620 Drug-induced polyneuropathy: Secondary | ICD-10-CM

## 2022-01-11 DIAGNOSIS — I5042 Chronic combined systolic (congestive) and diastolic (congestive) heart failure: Secondary | ICD-10-CM | POA: Insufficient documentation

## 2022-01-11 DIAGNOSIS — Z171 Estrogen receptor negative status [ER-]: Secondary | ICD-10-CM

## 2022-01-11 DIAGNOSIS — E559 Vitamin D deficiency, unspecified: Secondary | ICD-10-CM | POA: Insufficient documentation

## 2022-01-11 DIAGNOSIS — E1165 Type 2 diabetes mellitus with hyperglycemia: Secondary | ICD-10-CM | POA: Insufficient documentation

## 2022-01-11 DIAGNOSIS — T451X5A Adverse effect of antineoplastic and immunosuppressive drugs, initial encounter: Secondary | ICD-10-CM | POA: Diagnosis not present

## 2022-01-11 DIAGNOSIS — M25551 Pain in right hip: Secondary | ICD-10-CM | POA: Insufficient documentation

## 2022-01-11 DIAGNOSIS — H3552 Pigmentary retinal dystrophy: Secondary | ICD-10-CM | POA: Insufficient documentation

## 2022-01-11 DIAGNOSIS — Z5112 Encounter for antineoplastic immunotherapy: Secondary | ICD-10-CM | POA: Insufficient documentation

## 2022-01-11 DIAGNOSIS — I110 Hypertensive heart disease with heart failure: Secondary | ICD-10-CM | POA: Insufficient documentation

## 2022-01-11 DIAGNOSIS — Z95828 Presence of other vascular implants and grafts: Secondary | ICD-10-CM

## 2022-01-11 LAB — COMPREHENSIVE METABOLIC PANEL
ALT: 40 U/L (ref 0–44)
AST: 36 U/L (ref 15–41)
Albumin: 3.9 g/dL (ref 3.5–5.0)
Alkaline Phosphatase: 123 U/L (ref 38–126)
Anion gap: 6 (ref 5–15)
BUN: 11 mg/dL (ref 8–23)
CO2: 27 mmol/L (ref 22–32)
Calcium: 9.7 mg/dL (ref 8.9–10.3)
Chloride: 106 mmol/L (ref 98–111)
Creatinine, Ser: 0.68 mg/dL (ref 0.44–1.00)
GFR, Estimated: >60 mL/min (ref >60)
Glucose, Bld: 91 mg/dL (ref 70–99)
Potassium: 4.1 mmol/L (ref 3.5–5.1)
Sodium: 139 mmol/L (ref 135–145)
Total Bilirubin: 0.5 mg/dL (ref 0.3–1.2)
Total Protein: 7.4 g/dL (ref 6.5–8.1)

## 2022-01-11 LAB — CBC WITH DIFFERENTIAL/PLATELET
Abs Immature Granulocytes: 0.02 10*3/uL (ref 0.00–0.07)
Basophils Absolute: 0 10*3/uL (ref 0.0–0.1)
Basophils Relative: 0 %
Eosinophils Absolute: 0.2 10*3/uL (ref 0.0–0.5)
Eosinophils Relative: 3 %
HCT: 30.8 % — ABNORMAL LOW (ref 36.0–46.0)
Hemoglobin: 10.3 g/dL — ABNORMAL LOW (ref 12.0–15.0)
Immature Granulocytes: 0 %
Lymphocytes Relative: 19 %
Lymphs Abs: 1.1 10*3/uL (ref 0.7–4.0)
MCH: 30.5 pg (ref 26.0–34.0)
MCHC: 33.4 g/dL (ref 30.0–36.0)
MCV: 91.1 fL (ref 80.0–100.0)
Monocytes Absolute: 0.6 10*3/uL (ref 0.1–1.0)
Monocytes Relative: 11 %
Neutro Abs: 3.7 10*3/uL (ref 1.7–7.7)
Neutrophils Relative %: 67 %
Platelets: 337 10*3/uL (ref 150–400)
RBC: 3.38 MIL/uL — ABNORMAL LOW (ref 3.87–5.11)
RDW: 13.8 % (ref 11.5–15.5)
WBC: 5.6 10*3/uL (ref 4.0–10.5)
nRBC: 0 % (ref 0.0–0.2)

## 2022-01-11 MED ORDER — ALTEPLASE 2 MG IJ SOLR
2.0000 mg | Freq: Once | INTRAMUSCULAR | Status: AC | PRN
  Administered 2022-01-11: 2 mg
  Filled 2022-01-11: qty 2

## 2022-01-11 MED ORDER — FAMOTIDINE IN NACL 20-0.9 MG/50ML-% IV SOLN
20.0000 mg | Freq: Once | INTRAVENOUS | Status: AC
  Administered 2022-01-11: 20 mg via INTRAVENOUS
  Filled 2022-01-11: qty 50

## 2022-01-11 MED ORDER — SODIUM CHLORIDE 0.9 % IV SOLN
7.0000 mg/kg | Freq: Once | INTRAVENOUS | Status: AC
  Administered 2022-01-11: 640 mg via INTRAVENOUS
  Filled 2022-01-11: qty 64

## 2022-01-11 MED ORDER — ACETAMINOPHEN 325 MG PO TABS
650.0000 mg | ORAL_TABLET | Freq: Once | ORAL | Status: AC
  Administered 2022-01-11: 650 mg via ORAL
  Filled 2022-01-11: qty 2

## 2022-01-11 MED ORDER — PALONOSETRON HCL INJECTION 0.25 MG/5ML
0.2500 mg | Freq: Once | INTRAVENOUS | Status: AC
  Administered 2022-01-11: 0.25 mg via INTRAVENOUS
  Filled 2022-01-11: qty 5

## 2022-01-11 MED ORDER — HEPARIN SOD (PORK) LOCK FLUSH 100 UNIT/ML IV SOLN
500.0000 [IU] | Freq: Once | INTRAVENOUS | Status: AC | PRN
  Administered 2022-01-11: 500 [IU]

## 2022-01-11 MED ORDER — ATROPINE SULFATE 1 MG/ML IV SOLN
0.5000 mg | Freq: Once | INTRAVENOUS | Status: AC | PRN
  Administered 2022-01-11: 0.5 mg via INTRAVENOUS
  Filled 2022-01-11: qty 1

## 2022-01-11 MED ORDER — SODIUM CHLORIDE 0.9 % IV SOLN
150.0000 mg | Freq: Once | INTRAVENOUS | Status: AC
  Administered 2022-01-11: 150 mg via INTRAVENOUS
  Filled 2022-01-11: qty 150

## 2022-01-11 MED ORDER — DIPHENHYDRAMINE HCL 50 MG/ML IJ SOLN
50.0000 mg | Freq: Once | INTRAMUSCULAR | Status: AC
  Administered 2022-01-11: 50 mg via INTRAVENOUS
  Filled 2022-01-11: qty 1

## 2022-01-11 MED ORDER — SODIUM CHLORIDE 0.9% FLUSH
10.0000 mL | INTRAVENOUS | Status: DC | PRN
  Administered 2022-01-11: 10 mL via INTRAVENOUS

## 2022-01-11 MED ORDER — SODIUM CHLORIDE 0.9 % IV SOLN: Freq: Once | INTRAVENOUS | Status: AC

## 2022-01-11 MED ORDER — DEXAMETHASONE SODIUM PHOSPHATE 10 MG/ML IJ SOLN
5.0000 mg | Freq: Once | INTRAMUSCULAR | Status: AC
  Administered 2022-01-11: 5 mg via INTRAVENOUS
  Filled 2022-01-11: qty 1

## 2022-01-11 MED ORDER — SODIUM CHLORIDE 0.9% FLUSH
10.0000 mL | INTRAVENOUS | Status: DC | PRN
  Administered 2022-01-11: 10 mL

## 2022-01-11 NOTE — Patient Instructions (Signed)
[facility]  Discharge Instructions: Thank you for choosing [facility] to provide your oncology and hematology care.   If you have a lab appointment with the Cancer Center, please go directly to the Cancer Center and check in at the registration area.   Wear comfortable clothing and clothing appropriate for easy access to any Portacath or PICC line.   We strive to give you quality time with your provider. You may need to reschedule your appointment if you arrive late (15 or more minutes).  Arriving late affects you and other patients whose appointments are after yours.  Also, if you miss three or more appointments without notifying the office, you may be dismissed from the clinic at the provider's discretion.      For prescription refill requests, have your pharmacy contact our office and allow 72 hours for refills to be completed.    Today you received the following chemotherapy and/or immunotherapy agents: Trodelvy.       To help prevent nausea and vomiting after your treatment, we encourage you to take your nausea medication as directed.  BELOW ARE SYMPTOMS THAT SHOULD BE REPORTED IMMEDIATELY: *FEVER GREATER THAN 100.4 F (38 C) OR HIGHER *CHILLS OR SWEATING *NAUSEA AND VOMITING THAT IS NOT CONTROLLED WITH YOUR NAUSEA MEDICATION *UNUSUAL SHORTNESS OF BREATH *UNUSUAL BRUISING OR BLEEDING *URINARY PROBLEMS (pain or burning when urinating, or frequent urination) *BOWEL PROBLEMS (unusual diarrhea, constipation, pain near the anus) TENDERNESS IN MOUTH AND THROAT WITH OR WITHOUT PRESENCE OF ULCERS (sore throat, sores in mouth, or a toothache) UNUSUAL RASH, SWELLING OR PAIN  UNUSUAL VAGINAL DISCHARGE OR ITCHING   Items with * indicate a potential emergency and should be followed up as soon as possible or go to the Emergency Department if any problems should occur.  Please show the CHEMOTHERAPY ALERT CARD or IMMUNOTHERAPY ALERT CARD at check-in to  the Emergency Department and triage nurse.  Should you have questions after your visit or need to cancel or reschedule your appointment, please contact [facility]  Dept: [phone]  and follow the prompts.  Office hours are 8:00 a.m. to 4:30 p.m. Monday - Friday. Please note that voicemails left after 4:00 p.m. may not be returned until the following business day.  We are closed weekends and major holidays. You have access to a nurse at all times for urgent questions. Please call the main number to the clinic Dept: [phone] and follow the prompts.   For any non-urgent questions, you may also contact your provider using MyChart. We now offer e-Visits for anyone 18 and older to request care online for non-urgent symptoms. For details visit [email or web address].   Also download the MyChart app! Go to the app store, search "MyChart", open the app, select [facility], and log in with your MyChart username and password.  Due to Covid, a mask is required upon entering the hospital/clinic. If you do not have a mask, one will be given to you upon arrival. For doctor visits, patients may have 1 support person aged 18 or older with them. For treatment visits, patients cannot have anyone with them due to current Covid guidelines and our immunocompromised population.  

## 2022-01-11 NOTE — Progress Notes (Signed)
[place]   [phone] [phone]) [phone]   Clinic Follow up Note   Patient Care Team: [name] as PCP - General (Physician Assistant) [name], MD as PCP - Cardiology (Cardiology) [name], MD as Consulting Physician (Hematology) [name], MD as Consulting Physician (General Surgery) [name], [facility] as Pharmacist (Pharmacist) Jacelyn Pi, MD as Referring Physician (Endocrinology)  Date of Service:  01/11/2022  CHIEF COMPLAINT: f/u of metastatic breast cancer  CURRENT THERAPY:  [name], given on days 1 and 8 every 21 day cycle, starting 07/03/21  ASSESSMENT & PLAN:  Kristin Ward is a 66 y.o. female with   1. Left breast invasive ductal carcinoma, stage IB, p(T1cN0M0), Triple negative, Grade 3, in 2018, metastatic disease in 08/2020 ER 20% weakly + PR/HER2 (0) negative, PD-L1 0%, genetics (-) -diagnosed with triple negative breast cancer in 05/2017. She is s/p left breast lumpectomy, adjuvant chemo TC and Radiation.  -Genetic testing was negative for pathogenetic mutations.  -Due to new right gluteal pain, she underwent biopsy on 09/02/20 which showed metastatic carcinoma, consistent with breast primary. ER 20% positive, PR and HER2 negative.  -she received 4 cycles of Taxol 09/26/20 - 12/18/20, discontinued due to worsening neuropathy. She then took Xeloda from 01/01/21 - 06/2021. -she switched to [place] on 07/03/21 due to disease progression. She again experienced nausea as well as severe diarrhea after cycle 2, but was able to recover well. I reduced her dose from C4 and discontinued dexa. -she continues on trodlevy. Chemo was held in Feb 2023 due to her uncontrolled hyperglycemia.  -restaging CT CAP on 11/27/21 showed: stable right iliac wing lesion; increase in volume of inguinal lymph nodes, overall similar from prior and improved from 06/2021. Dr Burr Medico discussed about biopsy of LN, this is scheduled for  6/6 Dr Lisbeth Renshaw and team met with patient, plan is to do biopsy and consider RT if this is indeed metastatic. She is here before her next cycle of Trodelvy.  -I think its reasonable to continue since the only area of progression will be radiated. -No new symptoms. No new PE findings Ok to proceed with treatment , labs are within parameters   2. Diabetes and uncontrolled hyperglycemia -FBS is running around 70-130 Taking Lantus and using sliding scale as instructed by Dr. Burr Medico.   3. Right hip pain, Right iliac bone met, Vit D deficiency -She had radiating right hip pain since 2020 from right iliac wing mass -She completed Palliative radiation to right iliac 09/04/20 - 09/22/20 with Dr. Lisbeth Renshaw, and her pain is much improved.  -Given localized bone met, she started Zometa q1month on 11/20/20.  -No new dental concerns reported.  She is due for her Zometa on January 27, 2022  4. G2 peripheral Neuropathy  -She continues to have moderate numbness in her hands and light tingling in her feet. She can take B12 or acupuncture.  -Taxol stopped after 12/18/20.  -No change in neuropathy since her last visit.  5. DM, HTN, COPD, retinitis pigmentosa with limited vision, Chronic combined systolic and diastolic heart failure, EF 45-50% -Continue to follow-up with PCP Dr LRedmond Schooland cardiologist Dr HEllyn Hack -she is legally blind   6. Goal of care discussion  -The patient understands the goal of care is palliative. -she is full code now      Plan -proceed with TIvette Loyaltoday Follow-up with Dr. FLuis Abedas scheduled   No problem-specific Assessment & Plan notes found for this encounter.  SUMMARY OF ONCOLOGIC HISTORY: Oncology History Overview Note  Cancer Staging Malignant neoplasm of lower-outer quadrant of left breast of female, estrogen receptor negative ([place]) Staging form: Breast, AJCC 8th Edition - Clinical stage from 06/06/2017: Stage IB (cT1c, cN0, cM0, G3, ER-, PR-, HER2-) - Signed by [name], MD  on 06/15/2017 Nuclear grade: G3 Histologic grading system: 3 grade system - Pathologic stage from 06/26/2017: Stage IB (pT1c, pN0, cM0, G3, ER-, PR-, HER2-) - Signed by [name], MD on 07/10/2017 Neoadjuvant therapy: No Nuclear grade: G3 Multigene prognostic tests performed: None Histologic grading system: 3 grade system Laterality: Left     Malignant neoplasm of lower-outer quadrant of left breast of female, estrogen receptor negative ([place])  05/30/2017 Mammogram   Diagnostic mammo and US IMPRESSION: 1. Highly suspicious 1.4 cm mass in the slightly lower slightly outer left breast -tissue sampling recommended. 2. Indeterminate 0.5 mm mass in the slightly lower slightly outer left breast -tissue sampling recommended. 3. At least 2 left axillary lymph nodes with borderline cortical thickness.    06/06/2017 Initial Biopsy   Diagnosis 1. Breast, left, needle core biopsy, 5:30 o'clock - INVASIVE DUCTAL CARCINOMA, G3 2. Lymph node, needle/core biopsy, left axillary - NO CARCINOMA IDENTIFIED IN ONE LYMPH NODE (0/1)    06/06/2017 Initial Diagnosis   Malignant neoplasm of lower-outer quadrant of left breast of female, estrogen receptor negative ([place])    06/06/2017 Receptors her2   Estrogen Receptor: 0%, NEGATIVE Progesterone Receptor: 0%, NEGATIVE Proliferation Marker Ki67: 70%    06/26/2017 Surgery   LEFT BREAST LUMPECTOMY WITH RADIOACTIVE SEED AND SENTINEL LYMPH NODE BIOPSY ERAS  PATHWAY AND INSERTION PORT-A-CATH By Dr. Barry Dienes on 06/26/17     06/26/2017 Pathology Results   Diagnosis 06/26/17  1. Breast, lumpectomy, Left - INVASIVE DUCTAL CARCINOMA, GRADE III/III, SPANNING 1.2 CM. - THE SURGICAL RESECTION MARGINS ARE NEGATIVE FOR CARCINOMA. - SEE ONCOLOGY TABLE BELOW. 2. Lymph node, sentinel, biopsy, Left axillary #1 - THERE IS NO EVIDENCE OF CARCINOMA IN 1 OF 1 LYMPH NODE (0/1). 3. Lymph node, sentinel, biopsy, Left axillary #2 - THERE IS NO EVIDENCE OF CARCINOMA IN 1  OF 1 LYMPH NODE (0/1). 4. Lymph node, sentinel, biopsy, Left axillary #3 - THERE IS NO EVIDENCE OF CARCINOMA IN 1 OF 1 LYMPH NODE (0/1).     07/25/2017 - 09/29/2017 Chemotherapy   Adjuvant cytoxan and docetaxel (TC) every 3 weekd for 4 cycles      11/19/2017 - 12/17/2017 Radiation Therapy   Adjuvant breast radiation Left breast treated to 42.5 Gy with 17 fx of 2.5 Gy followed by a boost of 7.5 Gy with 3 fx of 2.5 Gy      02/2019 Procedure   She had PAC removal in 02/2019.    07/23/2020 Imaging   MRI Lumbar Spine 07/23/20 IMPRESSION: 1. No significant disc herniation, spinal canal or neural foraminal stenosis at any level. 2. Moderate facet degenerative changes at L3-4, L4-5 and L5-S1. 3. Multiple enlarged retroperitoneal and right iliac lymph nodes. Recommend correlation with CT of the abdomen and pelvis with contrast.   08/15/2020 Imaging   CT AP 08/15/20  IMPRESSION: 1. Right iliac and periaortic adenopathy is noted concerning for metastatic disease or lymphoma. Also noted is abnormal soft tissue mass anterior and posterior to the right iliac wing concerning for malignancy or metastatic disease. MRI is recommended for further evaluation. 2. Irregular lucency with sclerotic margins is seen involving the right iliac crest concerning for possible lytic lesion. MRI may be performed for further  evaluation. 3. Enlarged fibroid uterus. 4. Small gallstone. 5. Aortic atherosclerosis.   08/22/2020 Pathology Results   FINAL MICROSCOPIC DIAGNOSIS: 08/22/20  A. NEEDLE CORE, RIGHT GLUTEUS MINIMUMUS, BIOPSY:  -  Metastatic carcinoma  -  See comment   COMMENT:  By immunohistochemistry, the neoplastic cells are positive for  cytokeratin-7 and have weak patchy positivity for GATA3 but are negative for cytokeratin-20 and GCDFP.  The combined morphology and immunophenotype, support metastasis from the patient's known breast primary carcinoma.   ADDENDUM:  PROGNOSTIC INDICATOR RESULTS:   The tumor cells are NEGATIVE for Her2 (0).  Estrogen Receptor:       POSITIVE, 20%, WEAK STAINING  Progesterone Receptor:   NEGATIVE   PD-L1 (0) negative    09/04/2020 - 09/22/2020 Radiation Therapy   Palliative radiation to right iliac 09/04/20 - 09/22/20, Dr. Lisbeth Renshaw   09/26/2020 -  Chemotherapy   first line weekly Taxol 3 weeks on/1 week off starting 09/26/20 --Reduced to 2 weeks on/1 week off starting with C3 due to neuropathy. Stopped after C4 on 12/18/20 due to neuratrophy.  --Switched to Xeloda on 01/01/21 at 1545m in the AM and 20063min the PM for 2 weeks on/1 week off     10/13/2020 Procedure    PAC placement on 10/13/20.    04/10/2021 Imaging   CT CAP  IMPRESSION: 1. Enlarging abdominopelvic retroperitoneal and right inguinal adenopathy, compatible with disease progression. 2. Lytic metastasis in the right iliac wing, similar. 3. Hepatomegaly. 4. Cholelithiasis. 5. Enlarged fibroid uterus. 6.  Aortic atherosclerosis (ICD10-I70.0).   06/18/2021 Imaging   CT CAP  IMPRESSION: 1. Today's study demonstrates progression of metastatic disease as evidence by increased number and size of numerous enlarged lymph nodes in the retroperitoneum, along the right pelvic sidewall, and in the upper right thigh. 2. Osseous metastases in the bony pelvis appear similar to the prior examination. No definite new osseous lesions are otherwise noted. 3. Fibroid uterus again noted. 4. Cholelithiasis without evidence of acute cholecystitis. 5. Aortic atherosclerosis, in addition to left anterior descending coronary artery disease. Please note that although the presence of coronary artery calcium documents the presence of coronary artery disease, the severity of this disease and any potential stenosis cannot be assessed on this non-gated CT examination. Assessment for potential risk factor modification, dietary therapy or pharmacologic therapy may be warranted, if clinically indicated. 6. Additional  incidental findings, as above.   06/20/2021 Imaging   Bone Scan  IMPRESSION: No osseous uptake suspicious for recurrent/progressive metastatic disease.   07/03/2021 -  Chemotherapy   Patient is on Treatment Plan : BREAST METASTATIC Sacituzumab govitecan-hziy (TIvette Loyalq21d      09/27/2021 Imaging   EXAM: CT CHEST, ABDOMEN, AND PELVIS WITH CONTRAST  IMPRESSION: 1. Marked interval improvement in previously demonstrated lymphadenopathy in the abdomen and pelvis. A few prominent right inguinal lymph nodes remain. 2. Stable osseous metastatic disease in the pelvis. 3. No evidence of local recurrence or other metastatic disease. 4. Stable incidental findings including cholelithiasis, uterine fibroids and Aortic Atherosclerosis (ICD10-I70.0).  ADDENDUM: Not mentioned in the original impression is a possible nonocclusive fibrin sheath along the distal aspect of the Port-A-Cath.   09/27/2021 Imaging   EXAM: NUCLEAR MEDICINE WHOLE BODY BONE SCAN  IMPRESSION: Stable examination, without abnormal osseous uptake.   11/27/2021 Imaging   CT AP IMPRESSION: 1. Stable lytic and sclerotic lesion in the RIGHT iliac wing. 2. No evidence of visceral metastasis or adenopathy in the abdomenpelvis. 3. Leiomyomatous uterus.  ADDENDUM REPORT: 11/29/2021 09:33  Upon further review and discussion with Cira Rue NP, the RIGHT inguinal lymph nodes previous described have increased in volume. For example 14 mm node (image 91/2) is increased from 9 mm. Adjacent 14 mm node on image 86 is increased from 8 mm. Additionally, LEFT common iliac lymph node is also slightly increased measuring 10 mm (46/2) compared to 7 mm on comparison exam from 09/27/2021. These sites demonstrates similar but greater lymphadenopathy on CT 06/18/2021.      INTERVAL HISTORY:  Kristin Ward is here for a follow up of metastatic breast cancer.  She arrived to the appointment late because of delay with her port access.   She denies any new changes since her last visit with Dr. Burr Medico.  She continues to have some right groin pain for which she takes ibuprofen mostly and sometimes uses the oxycodone.  She denies any change in bowel habits.  No worsening neuropathy, diarrhea, cough, chest pain or shortness of breath. Rest of the pertinent 10 point ROS reviewed and negative  All other systems were reviewed with the patient and are negative.  MEDICAL HISTORY:  Past Medical History:  Diagnosis Date   Abnormal x-ray of lungs with single pulmonary nodule 03/15/2016   per records from [place]    Anemia    Asthma    Cholelithiasis 05/19/2017   On CT   Coronary artery calcification seen on CAT scan 05/19/2017   Diabetes mellitus without complication ([place])    type 2   Diabetes mellitus, new onset ([place]) 01/20/2019   Dilated idiopathic cardiomyopathy ([place]) 12/2015   EF 15-20%. Diagnosed in [facility]   Headache    NONE RECENT   Hypertension    Lymph nodes enlarged 07/25/2020   Malignant neoplasm of lower-outer quadrant of left breast of female, estrogen receptor negative ([place]) 06/11/2017   left breast   Mixed hyperlipidemia 06/17/2018   partially blind    Personal history of chemotherapy 2018-2019   Personal history of radiation therapy 2019   Retinitis pigmentosa    Retroperitoneal lymphadenopathy 07/25/2020   Uveitis     SURGICAL HISTORY: Past Surgical History:  Procedure Laterality Date   brain cyst removed  2007   to help with headaches per patient , aspirated    BREAST BIOPSY Left 06/06/2017   x2   BREAST CYST ASPIRATION Left 06/06/2017   BREAST LUMPECTOMY Left 06/26/2017   BREAST LUMPECTOMY WITH RADIOACTIVE SEED AND SENTINEL LYMPH NODE BIOPSY Left 06/26/2017   Procedure: BREAST LUMPECTOMY WITH RADIOACTIVE SEED AND SENTINEL LYMPH NODE BIOPSY ERAS  PATHWAY;  Surgeon: [name], MD;  Location: [place];  Service: General;  Laterality: Left;  pec block   FINE NEEDLE ASPIRATION Left  02/23/2019   Procedure: FINE NEEDLE ASPIRATION;  Surgeon: [name], MD;  Location: [place];  Service: General;  Laterality: Left;  Asiration of left breast seroma    IR IMAGING GUIDED PORT INSERTION  10/13/2020   NASAL TURBINATE REDUCTION     PORT-A-CATH REMOVAL Right 02/23/2019   Procedure: REMOVAL PORT-A-CATH;  Surgeon: [name], MD;  Location: [place];  Service: General;  Laterality: Right;   PORTACATH PLACEMENT N/A 06/26/2017   Procedure: INSERTION PORT-A-CATH;  Surgeon: [name], MD;  Location: [place];  Service: General;  Laterality: N/A;   TRANSTHORACIC ECHOCARDIOGRAM  12/2015   A) [facility] May 2017: Mild concentric LVH. Global hypokinesis. GR daily. EF 18% severe LA dilation. Mitral annular dilatation with papillary muscle dysfunction and moderate MR. Dilated IVC  consistent with elevated RAP.   TRANSTHORACIC ECHOCARDIOGRAM  05/2017; 08/2017   a) Mild concentric LVH.  EF 45 to 50% with diffuse hypokinesis.  GR 1 DD.  Mild aortic root dilation.;; b)  Mild LVH EF 45 to 50%.  Diffuse HK.  No significant valve disease.  No significant change.    I have reviewed the social history and family history with the patient and they are unchanged from previous note.  ALLERGIES:  is allergic to morphine and related, latex, tylenol with codeine #3 [acetaminophen-codeine], and penicillins.  MEDICATIONS:  Current Outpatient Medications  Medication Sig Dispense Refill   Accu-Chek FastClix Lancets MISC TEST TWICE A DAY. PT USES AN ACCU-CHEK GUIDE ME METER 102 each 2   aspirin 81 MG chewable tablet Chew 81 mg by mouth daily.     atorvastatin (LIPITOR) 20 MG tablet Take 1 tablet (20 mg total) by mouth daily. 90 tablet 3   brimonidine (ALPHAGAN) 0.2 % ophthalmic solution Place 1 drop into both eyes 2 (two) times daily.     carvedilol (COREG) 12.5 MG tablet Take 1 tablet (12.5 mg total) by mouth 2 (two) times daily with a meal. 180 tablet 1    Cholecalciferol (D3-1000 PO) Take 2 tablets by mouth daily.     Continuous Blood Gluc Receiver (DEXCOM G7 RECEIVER) DEVI 1 Device by Does not apply route 2 (two) times daily. 1 each 1   Continuous Blood Gluc Sensor (DEXCOM G7 SENSOR) MISC 1 Units by Does not apply route 2 (two) times daily. 3 each 11   furosemide (LASIX) 20 MG tablet TAKE 2 TABLETS (40 MG TOTAL) BY MOUTH AS NEEDED. FOR WEIGHT GAIN 3 TO 4 LBS. 90 tablet 3   ibuprofen (ADVIL) 800 MG tablet Take 1 tablet (800 mg total) by mouth every 8 (eight) hours as needed. 30 tablet 2   insulin glargine (LANTUS SOLOSTAR) 100 UNIT/ML Solostar Pen Inject 15 Units into the skin at bedtime. 15 mL 5   insulin lispro protamine-lispro (HUMALOG 50/50 MIX) (50-50) 100 UNIT/ML SUSP injection Inject into the skin 2 (two) times daily before a meal.     Lancets Misc. (ACCU-CHEK SOFTCLIX LANCET DEV) KIT 1 Units by Does not apply route 2 (two) times daily. Check FSBS BID - Include strips # 50 with 5 refills, Lancets #50 with 5 refills DX: Type 2 DM - ICD 10: E11.9 1 kit 0   lidocaine-prilocaine (EMLA) cream Apply 1 application topically as needed. 30 g 0   linaclotide (LINZESS) 72 MCG capsule Take 1 capsule (72 mcg total) by mouth daily before breakfast. MUST HAVE APPOINTMENT FOR REFILLS 30 capsule 1   metFORMIN (GLUCOPHAGE) 1000 MG tablet Take 1 tablet (1,000 mg total) by mouth 2 (two) times daily with a meal. 180 tablet 3   Multiple Vitamins-Minerals (WOMENS MULTI PO) Take by mouth.     ondansetron (ZOFRAN) 8 MG tablet Take 1 tablet (8 mg total) by mouth every 8 (eight) hours as needed for nausea or vomiting (begin on day 3 after chemo). 30 tablet 1   oxyCODONE (OXY IR/ROXICODONE) 5 MG immediate release tablet Take 1 tablet (5 mg total) by mouth every 8 (eight) hours as needed for severe pain. 60 tablet 0   oxyCODONE-acetaminophen (PERCOCET/ROXICET) 5-325 MG tablet      pantoprazole (PROTONIX) 40 MG tablet TAKE 1 TABLET BY MOUTH EVERY DAY 90 tablet 1    prochlorperazine (COMPAZINE) 10 MG tablet Take 1 tablet (10 mg total) by mouth every 6 (six) hours as needed (  Nausea or vomiting). 30 tablet 1   sacubitril-valsartan (ENTRESTO) 97-103 MG Take 1 tablet by mouth 2 (two) times daily. 180 tablet 3   Semaglutide (RYBELSUS) 7 MG TABS Take 7 mg by mouth daily. 90 tablet 3   spironolactone (ALDACTONE) 25 MG tablet Take 1 tablet (25 mg total) by mouth daily. (Patient not taking: Reported on 10/12/2021) 90 tablet 3   vitamin B-12 (CYANOCOBALAMIN) 500 MCG tablet Take 1,000 mcg by mouth daily.     vitamin C (ASCORBIC ACID) 500 MG tablet Take 500 mg by mouth in the morning, at noon, in the evening, and at bedtime.     zinc gluconate 50 MG tablet Take 50 mg by mouth daily.     Current Facility-Administered Medications  Medication Dose Route Frequency Provider Last Rate Last Admin   insulin glulisine (APIDRA) injection 6 Units  6 Units Subcutaneous Once Williams, Lynne B, PA-C       Facility-Administered Medications Ordered in Other Visits  Medication Dose Route Frequency Provider Last Rate Last Admin   sodium chloride flush (NS) 0.9 % injection 10 mL  10 mL Intravenous PRN [name], MD        PHYSICAL EXAMINATION: ECOG PERFORMANCE STATUS: 1 - Symptomatic but completely ambulatory  There were no vitals filed for this visit.  Wt Readings from Last 3 Encounters:  01/01/22 212 lb (96.2 kg)  12/27/21 216 lb 8 oz (98.2 kg)  12/21/21 210 lb 3.2 oz (95.3 kg)    Physical Exam Constitutional:      Appearance: Normal appearance.  Cardiovascular:     Rate and Rhythm: Normal rate and regular rhythm.     Pulses: Normal pulses.     Heart sounds: Normal heart sounds.  Pulmonary:     Effort: Pulmonary effort is normal.     Breath sounds: Normal breath sounds.  Musculoskeletal:        General: Swelling (Right lower extremity swelling greater than left at baseline) present.     Cervical back: Normal range of motion and neck supple. No rigidity.   Lymphadenopathy:     Cervical: No cervical adenopathy.  Skin:    General: Skin is warm and dry.  Neurological:     Mental Status: She is alert.  Psychiatric:        Mood and Affect: Mood normal.     LABORATORY DATA:  I have reviewed the data as listed    Latest Ref Rng & Units 12/27/2021    8:59 AM 12/21/2021    8:22 AM 12/06/2021   11:12 AM  CBC  WBC 4.0 - 10.5 K/uL 3.8   5.0   3.3    Hemoglobin 12.0 - 15.0 g/dL 9.7   10.2   9.6    Hematocrit 36.0 - 46.0 % 29.1   31.1   29.7    Platelets 150 - 400 K/uL 233   240   298          Latest Ref Rng & Units 12/27/2021    8:59 AM 12/21/2021    8:22 AM 12/06/2021    9:47 AM  CMP  Glucose 70 - 99 mg/dL 105   144   99    BUN 8 - 23 mg/dL '10   12   8    ' Creatinine 0.44 - 1.00 mg/dL 0.62   0.69   0.57    Sodium 135 - 145 mmol/L 138   141   142    Potassium 3.5 - 5.1 mmol/L 3.9  3.9   3.7    Chloride 98 - 111 mmol/L 107   108   110    CO2 22 - 32 mmol/L '27   27   29    ' Calcium 8.9 - 10.3 mg/dL 9.0   9.2   8.8    Total Protein 6.5 - 8.1 g/dL 7.2   7.4   7.2    Total Bilirubin 0.3 - 1.2 mg/dL 0.4   0.7   0.4    Alkaline Phos 38 - 126 U/L 91   94   91    AST 15 - 41 U/L 12   53   13    ALT 0 - 44 U/L 12   34   8        RADIOGRAPHIC STUDIES: I have personally reviewed the radiological images as listed and agreed with the findings in the report. No results found.    No orders of the defined types were placed in this encounter.  All questions were answered. The patient knows to call the clinic with any problems, questions or concerns. No barriers to learning was detected. The total time spent in the appointment was 40 minutes, patient is new to me, seeing her while Dr. Luis Abed is away     Benay Pike, MD 01/11/2022

## 2022-01-12 LAB — CANCER ANTIGEN 27.29: CA 27.29: 62.1 U/mL — ABNORMAL HIGH (ref 0.0–38.6)

## 2022-01-14 ENCOUNTER — Telehealth (HOSPITAL_COMMUNITY): Payer: Self-pay | Admitting: *Deleted

## 2022-01-14 ENCOUNTER — Other Ambulatory Visit: Payer: Self-pay | Admitting: Student

## 2022-01-15 ENCOUNTER — Ambulatory Visit (HOSPITAL_COMMUNITY)
Admission: RE | Admit: 2022-01-15 | Discharge: 2022-01-15 | Disposition: A | Discharge status: Home / Self Care | Payer: Medicare Other | Source: Ambulatory Visit | Attending: Hematology | Admitting: Hematology

## 2022-01-15 DIAGNOSIS — Z923 Personal history of irradiation: Secondary | ICD-10-CM | POA: Diagnosis not present

## 2022-01-15 DIAGNOSIS — Z9221 Personal history of antineoplastic chemotherapy: Secondary | ICD-10-CM | POA: Insufficient documentation

## 2022-01-15 DIAGNOSIS — C50512 Malignant neoplasm of lower-outer quadrant of left female breast: Secondary | ICD-10-CM

## 2022-01-15 DIAGNOSIS — I509 Heart failure, unspecified: Secondary | ICD-10-CM | POA: Diagnosis not present

## 2022-01-15 DIAGNOSIS — R590 Localized enlarged lymph nodes: Secondary | ICD-10-CM | POA: Diagnosis not present

## 2022-01-15 DIAGNOSIS — I110 Hypertensive heart disease with heart failure: Secondary | ICD-10-CM | POA: Diagnosis not present

## 2022-01-15 DIAGNOSIS — C774 Secondary and unspecified malignant neoplasm of inguinal and lower limb lymph nodes: Secondary | ICD-10-CM | POA: Diagnosis not present

## 2022-01-15 DIAGNOSIS — D259 Leiomyoma of uterus, unspecified: Secondary | ICD-10-CM | POA: Insufficient documentation

## 2022-01-15 DIAGNOSIS — E119 Type 2 diabetes mellitus without complications: Secondary | ICD-10-CM | POA: Insufficient documentation

## 2022-01-15 DIAGNOSIS — Z853 Personal history of malignant neoplasm of breast: Secondary | ICD-10-CM | POA: Insufficient documentation

## 2022-01-15 MED ORDER — LIDOCAINE HCL (PF) 1 % IJ SOLN
INTRAMUSCULAR | Status: AC
  Filled 2022-01-15: qty 30

## 2022-01-15 NOTE — Procedures (Signed)
Interventional Radiology Procedure Note  Procedure: Ultrasound guided right inguinal lymph node biopsy  Findings: Please refer to procedural dictation for full description. 18 ga core x4.  Samples split between formalin and saline soaked telfa pad.  Complications: None immediate  Estimated Blood Loss: < 5 Ml  Recommendations: Follow up Pathology results.   Ruthann Cancer, MD

## 2022-01-15 NOTE — H&P (Signed)
Chief Complaint: Patient was seen in consultation today for lymph node biopsy   Referring Physician(s): Feng,Yan  Supervising Physician: Ruthann Cancer  Patient Status: [facility] - Out-pt  History of Present Illness: Kristin Ward is a 66 y.o. female with a medical history significant for DM, heart failure, HTN, partial blindness and left breast cancer s/p lumpectomy/chemo/radiation. She is familiar to IR from prior procedures including a biopsy 08/22/20 and port placement 10/13/20. She has experienced disease progression since starting treatment and recent imaging shows interval growth of a previously stable right inguinal lymph node.   CT abdomen/pelvis with contrast 11/27/21 IMPRESSION: 1. Stable lytic and sclerotic lesion in the RIGHT iliac wing. 2. No evidence of visceral metastasis or adenopathy in the abdomen pelvis. 3. Leiomyomatous uterus. ADDENDUM: Upon further review and discussion with Cira Rue NP, the RIGHT inguinal lymph nodes previous described have increased in volume. For example 14 mm node (image 91/2) is increased from 9 mm. Adjacent 14 mm node on image 86 is increased from 8 mm. Additionally, LEFT common iliac lymph node is also slightly increased measuring 10 mm (46/2) compared to 7 mm on comparison exam from 09/27/2021. These sites demonstrates similar but greater lymphadenopathy on CT 06/18/2021.  Interventional Radiology has been asked to evaluate this patient for an image-guided lymph node biopsy. Imaging reviewed and procedure approved by Dr. Anselm Pancoast.   Past Medical History:  Diagnosis Date   Abnormal x-ray of lungs with single pulmonary nodule 03/15/2016   per records from [place]    Anemia    Asthma    Cholelithiasis 05/19/2017   On CT   Coronary artery calcification seen on CAT scan 05/19/2017   Diabetes mellitus without complication ([place])    type 2   Diabetes mellitus, new onset ([place]) 01/20/2019   Dilated idiopathic cardiomyopathy ([place]) 12/2015    EF 15-20%. Diagnosed in [facility]   Headache    NONE RECENT   Hypertension    Lymph nodes enlarged 07/25/2020   Malignant neoplasm of lower-outer quadrant of left breast of female, estrogen receptor negative ([place]) 06/11/2017   left breast   Mixed hyperlipidemia 06/17/2018   partially blind    Personal history of chemotherapy 2018-2019   Personal history of radiation therapy 2019   Retinitis pigmentosa    Retroperitoneal lymphadenopathy 07/25/2020   Uveitis     Past Surgical History:  Procedure Laterality Date   brain cyst removed  2007   to help with headaches per patient , aspirated    BREAST BIOPSY Left 06/06/2017   x2   BREAST CYST ASPIRATION Left 06/06/2017   BREAST LUMPECTOMY Left 06/26/2017   BREAST LUMPECTOMY WITH RADIOACTIVE SEED AND SENTINEL LYMPH NODE BIOPSY Left 06/26/2017   Procedure: BREAST LUMPECTOMY WITH RADIOACTIVE SEED AND SENTINEL LYMPH NODE BIOPSY ERAS  PATHWAY;  Surgeon: [name], MD;  Location: [place];  Service: General;  Laterality: Left;  pec block   FINE NEEDLE ASPIRATION Left 02/23/2019   Procedure: FINE NEEDLE ASPIRATION;  Surgeon: [name], MD;  Location: [place];  Service: General;  Laterality: Left;  Asiration of left breast seroma    IR IMAGING GUIDED PORT INSERTION  10/13/2020   NASAL TURBINATE REDUCTION     PORT-A-CATH REMOVAL Right 02/23/2019   Procedure: REMOVAL PORT-A-CATH;  Surgeon: [name], MD;  Location: [place];  Service: General;  Laterality: Right;   PORTACATH PLACEMENT N/A 06/26/2017   Procedure: INSERTION PORT-A-CATH;  Surgeon: [name], MD;  Location: [place];  Service: General;  Laterality: N/A;   TRANSTHORACIC ECHOCARDIOGRAM  12/2015   A) [facility] May 2017: Mild concentric LVH. Global hypokinesis. GR daily. EF 18% severe LA dilation. Mitral annular dilatation with papillary muscle dysfunction and moderate MR. Dilated IVC consistent with elevated RAP.    TRANSTHORACIC ECHOCARDIOGRAM  05/2017; 08/2017   a) Mild concentric LVH.  EF 45 to 50% with diffuse hypokinesis.  GR 1 DD.  Mild aortic root dilation.;; b)  Mild LVH EF 45 to 50%.  Diffuse HK.  No significant valve disease.  No significant change.    Allergies: Morphine and related, Latex, Tylenol with codeine #3 [acetaminophen-codeine], and Penicillins  Medications: Prior to Admission medications   Medication Sig Start Date End Date Taking? Authorizing Provider  aspirin 81 MG chewable tablet Chew 81 mg by mouth daily.   Yes [provider]  atorvastatin (LIPITOR) 20 MG tablet Take 1 tablet (20 mg total) by mouth daily. 06/25/21 06/25/22 Yes [name], MD  brimonidine (ALPHAGAN) 0.2 % ophthalmic solution Place 1 drop into both eyes 2 (two) times daily. 09/19/21  Yes [provider]  carvedilol (COREG) 12.5 MG tablet Take 1 tablet (12.5 mg total) by mouth 2 (two) times daily with a meal. 09/13/21  Yes Francis Gaines B, PA-C  cholecalciferol (VITAMIN D3) 25 MCG (1000 UNIT) tablet Take 2,000 Units by mouth daily.   Yes [provider]  furosemide (LASIX) 20 MG tablet TAKE 2 TABLETS (40 MG TOTAL) BY MOUTH AS NEEDED. FOR WEIGHT GAIN 3 TO 4 LBS. 09/21/21  Yes [name], MD  HUMALOG KWIKPEN 100 UNIT/ML KwikPen Inject 3-10 Units into the skin 3 (three) times daily before meals. Per sliding scale 11/23/21  Yes [provider]  ibuprofen (ADVIL) 800 MG tablet Take 1 tablet (800 mg total) by mouth every 8 (eight) hours as needed. 09/07/21  Yes [name], MD  insulin glargine (LANTUS SOLOSTAR) 100 UNIT/ML Solostar Pen Inject 15 Units into the skin at bedtime. Patient taking differently: Inject 15-25 Units into the skin See admin instructions. Chemo 15 units in the morning, 25 units at bedtime, Non Chemo 25 units at bedtime 11/09/21  Yes [name], MD  lidocaine-prilocaine (EMLA) cream Apply 1 application topically as needed. 10/23/20  Yes [name], MD  linaclotide  [facility]) 72 MCG capsule Take 1 capsule (72 mcg total) by mouth daily before breakfast. MUST HAVE APPOINTMENT FOR REFILLS Patient taking differently: Take 72 mcg by mouth daily as needed (constipation). MUST HAVE APPOINTMENT FOR REFILLS 06/26/21  Yes Noralyn Pick, NP  metFORMIN (GLUCOPHAGE) 1000 MG tablet Take 1 tablet (1,000 mg total) by mouth 2 (two) times daily with a meal. 10/12/21  Yes Francis Gaines B, PA-C  Multiple Vitamins-Minerals (WOMENS MULTI PO) Take 15 mLs by mouth daily.   Yes [provider]  ondansetron (ZOFRAN) 8 MG tablet Take 1 tablet (8 mg total) by mouth every 8 (eight) hours as needed for nausea or vomiting (begin on day 3 after chemo). 12/21/21  Yes [name], MD  oxyCODONE (OXY IR/ROXICODONE) 5 MG immediate release tablet Take 1 tablet (5 mg total) by mouth every 8 (eight) hours as needed for severe pain. 12/21/21  Yes [name], MD  oxyCODONE-acetaminophen (PERCOCET/ROXICET) 5-325 MG tablet Take 1 tablet by mouth every 6 (six) hours as needed for severe pain.   Yes [provider]  pantoprazole (PROTONIX) 40 MG tablet TAKE 1 TABLET BY MOUTH EVERY DAY Patient taking differently: Take 40 mg by mouth daily  as needed (acid reflux). 12/06/21  Yes [name], MD  prochlorperazine (COMPAZINE) 10 MG tablet Take 1 tablet (10 mg total) by mouth every 6 (six) hours as needed (Nausea or vomiting). 08/20/21  Yes Alla Feeling, NP  sacubitril-valsartan (ENTRESTO) 97-103 MG Take 1 tablet by mouth 2 (two) times daily. 05/02/21  Yes [name], MD  Semaglutide (RYBELSUS) 7 MG TABS Take 7 mg by mouth daily. 10/12/21  Yes Irene Pap, PA-C  spironolactone (ALDACTONE) 25 MG tablet Take 25 mg by mouth daily.   Yes [provider]  vitamin B-12 (CYANOCOBALAMIN) 1000 MCG tablet Take 1,000 mcg by mouth daily.   Yes [provider]  vitamin C (ASCORBIC ACID) 500 MG tablet Take 500 mg by mouth in the morning, at noon, in the evening, and at bedtime.    Yes [provider]  zinc gluconate 50 MG tablet Take 50 mg by mouth daily.   Yes [provider]  Accu-Chek FastClix Lancets MISC TEST TWICE A DAY. PT USES AN ACCU-CHEK GUIDE ME METER 01/29/21   Henson, Vickie L, NP-C  Continuous Blood Gluc Receiver (DEXCOM G7 RECEIVER) DEVI 1 Device by Does not apply route 2 (two) times daily. 11/15/21   Francis Gaines B, PA-C  Continuous Blood Gluc Sensor (DEXCOM G7 SENSOR) MISC 1 Units by Does not apply route 2 (two) times daily. 11/15/21   Irene Pap, PA-C  Lancets Misc. (ACCU-CHEK SOFTCLIX LANCET DEV) KIT 1 Units by Does not apply route 2 (two) times daily. Check FSBS BID - Include strips # 50 with 5 refills, Lancets #50 with 5 refills DX: Type 2 DM - ICD 10: E11.9 10/12/21   Irene Pap, PA-C     Family History  Problem Relation Age of Onset   Colon cancer Mother 1       deceased 47   Heart attack Father    Breast cancer Other 67       2nd cousin on maternal side; currently 57   Pancreatic cancer Other        2nd cousin once removed on maternal side; deceased 69s   Fibromyalgia Sister    Esophageal cancer Neg Hx    Rectal cancer Neg Hx    Stomach cancer Neg Hx     Social History   Socioeconomic History   Marital status: Widowed    Spouse name: Not on file   Number of children: 2   Years of education: Not on file   Highest education level: Not on file  Occupational History   Not on file  Tobacco Use   Smoking status: Former    Packs/day: 0.25    Years: 23.00    Pack years: 5.75    Types: Cigarettes    Quit date: 10/09/2015    Years since quitting: 6.2   Smokeless tobacco: Never  Vaping Use   Vaping Use: Never used  Substance and Sexual Activity   Alcohol use: No   Drug use: No   Sexual activity: Never  Other Topics Concern   Not on file  Social History Narrative   She is a widowed mother of 2 (daughter and son).   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   She moved to [place] apparently back  in 2016, but was visiting family in [place] and was diagnosed with Cardiomyopathy-EF 18%.   She is usually accompanied by her daughter.    At baseline, she walks roughly 1-2 miles a day.  Social Determinants of Health   Financial Resource Strain: Low Risk    Difficulty of Paying Living Expenses: Not hard at all  Food Insecurity: Not on file  Transportation Needs: No Transportation Needs   Lack of Transportation (Medical): No   Lack of Transportation (Non-Medical): No  Physical Activity: Not on file  Stress: Not on file  Social Connections: Not on file    Review of Systems: A 12 point ROS discussed and pertinent positives are indicated in the HPI above.  All other systems are negative.  Review of Systems  Constitutional:  Negative for appetite change and fatigue.  Respiratory:  Negative for cough and shortness of breath.   Cardiovascular:  Negative for chest pain and leg swelling.  Gastrointestinal:  Negative for abdominal pain, diarrhea, nausea and vomiting.  Musculoskeletal:  Negative for back pain.  Neurological:  Negative for dizziness and headaches.  Hematological:  Positive for adenopathy.   Vital Signs: BP (!) 142/92   Pulse 88   Temp 98.8 F (37.1 C) (Oral)   Resp 18   Ht 5' 7.25" (1.708 m)   Wt 214 lb (97.1 kg)   SpO2 98%   BMI 33.27 kg/m   Physical Exam Constitutional:      General: She is not in acute distress.    Appearance: She is not ill-appearing.  HENT:     Mouth/Throat:     Mouth: Mucous membranes are moist.     Pharynx: Oropharynx is clear.  Cardiovascular:     Rate and Rhythm: Normal rate and regular rhythm.     Pulses: Normal pulses.     Heart sounds: Normal heart sounds.     Comments: Right port-a-catheter, no accessed. Site unremarkable.  Pulmonary:     Effort: Pulmonary effort is normal.     Breath sounds: Normal breath sounds.  Abdominal:     General: Bowel sounds are normal.     Palpations: Abdomen is soft.  Musculoskeletal:      Right lower leg: No edema.     Left lower leg: No edema.     Comments: Right inguinal lymphadenopathy.   Skin:    General: Skin is warm and dry.  Neurological:     Mental Status: She is alert and oriented to person, place, and time.    Imaging: No results found.  Labs:  CBC: Recent Labs    12/06/21 1112 12/21/21 0822 12/27/21 0859 01/11/22 0828  WBC 3.3* 5.0 3.8* 5.6  HGB 9.6* 10.2* 9.7* 10.3*  HCT 29.7* 31.1* 29.1* 30.8*  PLT 298 240 233 337    COAGS: No results for input(s): INR, APTT in the last 8760 hours.  BMP: Recent Labs    12/06/21 0947 12/21/21 0822 12/27/21 0859 01/11/22 0828  NA 142 141 138 139  K 3.7 3.9 3.9 4.1  CL 110 108 107 106  CO2 _0 GLUCOSE 99 144* 105* 91  BUN _1 CALCIUM 8.8* 9.2 9.0 9.7  CREATININE 0.57 0.69 0.62 0.68  GFRNONAA >60 >60 >60 >60    LIVER FUNCTION TESTS: Recent Labs    12/06/21 0947 12/21/21 0822 12/27/21 0859 01/11/22 0828  BILITOT 0.4 0.7 0.4 0.5  AST 13* 53* 12* 36  ALT 8 34 12 40  ALKPHOS 91 94 91 123  PROT 7.2 7.4 7.2 7.4  ALBUMIN 3.8 3.9 3.6 3.9    TUMOR MARKERS: No results for input(s): AFPTM, CEA, CA199, CHROMGRNA in the last 8760 hours.  Assessment and  Plan:  History of breast cancer; enlarged right inguinal lymph node: Kristin Ward, 66 year old female, presents today to the [facility] department for an image-guided lymph node biopsy. This procedure will be done with local anesthesia per patient request.   Risks and benefits of this procedure were discussed with the patient and/or patient's family including, but not limited to bleeding, infection, damage to adjacent structures or low yield requiring additional tests.  All of the questions were answered and there is agreement to proceed.    Consent signed and in chart.  Thank you for this interesting consult.  I greatly enjoyed meeting Taisley Mordan Dock and look forward to participating in their  care.  A copy of this report was sent to the requesting provider on this date.  Electronically Signed: Soyla Dryer, AGACNP-BC [phone] 01/15/2022, 8:15 AM   I spent a total of  30 Minutes   in face to face in clinical consultation, greater than 50% of which was counseling/coordinating care for lymph node biopsy

## 2022-01-16 ENCOUNTER — Other Ambulatory Visit: Payer: Self-pay

## 2022-01-16 ENCOUNTER — Other Ambulatory Visit: Payer: Self-pay | Admitting: Physician Assistant

## 2022-01-16 ENCOUNTER — Ambulatory Visit
Admission: RE | Admit: 2022-01-16 | Discharge: 2022-01-16 | Disposition: A | Discharge status: Home / Self Care | Payer: Medicare Other | Source: Ambulatory Visit | Attending: Radiation Oncology | Admitting: Radiation Oncology

## 2022-01-16 DIAGNOSIS — C50512 Malignant neoplasm of lower-outer quadrant of left female breast: Secondary | ICD-10-CM | POA: Diagnosis not present

## 2022-01-16 DIAGNOSIS — C50912 Malignant neoplasm of unspecified site of left female breast: Secondary | ICD-10-CM | POA: Diagnosis not present

## 2022-01-16 DIAGNOSIS — Z510 Encounter for antineoplastic radiation therapy: Secondary | ICD-10-CM | POA: Insufficient documentation

## 2022-01-16 DIAGNOSIS — C774 Secondary and unspecified malignant neoplasm of inguinal and lower limb lymph nodes: Secondary | ICD-10-CM | POA: Diagnosis not present

## 2022-01-16 NOTE — Telephone Encounter (Signed)
Is this ok to refill. She needs the Dexcom 7

## 2022-01-17 LAB — SURGICAL PATHOLOGY

## 2022-01-17 MED FILL — Fosaprepitant Dimeglumine For IV Infusion 150 MG (Base Eq): INTRAVENOUS | Qty: 5 | Status: AC

## 2022-01-18 ENCOUNTER — Telehealth: Payer: Self-pay

## 2022-01-18 ENCOUNTER — Other Ambulatory Visit: Payer: Self-pay

## 2022-01-18 ENCOUNTER — Inpatient Hospital Stay: Payer: Medicare Other

## 2022-01-18 NOTE — Telephone Encounter (Signed)
Spoke with pt via telephone regarding her appt today in infusion.  Pt stated she told Dr. Burr Medico, Cira Rue, NP, and Dr. Chryl Heck that she was taking a vacation and was not going to be doing [place] even though she comes daily for radiation.  This RN thinks that the pt maybe confused about her treatment plan.  The pt will do a phone visit with Dr. Burr Medico on 01/24/2022.  Sent message to Dr. Burr Medico regarding pt missing her Sacituzumab Govitecan-hziy infusions.

## 2022-01-23 ENCOUNTER — Telehealth: Payer: Self-pay | Admitting: Cardiology

## 2022-01-23 NOTE — Telephone Encounter (Signed)
Patient is starting radiation on June 19th and would like to know how her medication will affect her and if she need to hold any medication.

## 2022-01-24 ENCOUNTER — Telehealth: Payer: Self-pay

## 2022-01-24 ENCOUNTER — Encounter: Payer: Self-pay | Admitting: Hematology

## 2022-01-24 ENCOUNTER — Inpatient Hospital Stay (HOSPITAL_BASED_OUTPATIENT_CLINIC_OR_DEPARTMENT_OTHER): Payer: Medicare Other | Admitting: Hematology

## 2022-01-24 ENCOUNTER — Other Ambulatory Visit: Payer: Self-pay

## 2022-01-24 DIAGNOSIS — Z171 Estrogen receptor negative status [ER-]: Secondary | ICD-10-CM | POA: Diagnosis not present

## 2022-01-24 DIAGNOSIS — C50512 Malignant neoplasm of lower-outer quadrant of left female breast: Secondary | ICD-10-CM | POA: Diagnosis not present

## 2022-01-24 NOTE — Telephone Encounter (Signed)
Should not be any issues with her medications

## 2022-01-24 NOTE — Telephone Encounter (Signed)
Spoke with Earlie Server in the Echocardiogram Dept to have pt scheduled for her ECHO.  Pt is scheduled on 01/31/2022 at 9am prior to her radiation appt.  Pt's approved for ECHO but pending approval for Enhertu.

## 2022-01-24 NOTE — Telephone Encounter (Signed)
Left message to call back  

## 2022-01-24 NOTE — Progress Notes (Signed)
DISCONTINUE ON PATHWAY REGIMEN - Breast     A cycle is every 21 days:     Sacituzumab govitecan-hziy   **Always confirm dose/schedule in your pharmacy ordering system**  REASON: Disease Progression PRIOR TREATMENT: [id number]: Sacituzumab Govitecan 10 mg/kg D1, 8 q21 Days TREATMENT RESPONSE: Partial Response (PR)  START ON PATHWAY REGIMEN - Breast     A cycle is every 21 days:     Fam-trastuzumab deruxtecan-nxki   **Always confirm dose/schedule in your pharmacy ordering system**  Patient Characteristics: Distant Metastases or Locoregional Recurrent Disease - Unresected or Locally Advanced Unresectable Disease Progressing after Neoadjuvant and Local Therapies, ER Negative/Unknown, Chemotherapy, HER2 Low, Third Line and Beyond, Prior or Contraindicated  Anthracycline and Prior or Contraindicated Eribulin Therapeutic Status: Distant Metastases HER2 Status: Low ER Status: Negative (-) PR Status: Negative (-) Therapy Approach Indicated: Standard Chemotherapy/Endocrine Therapy Line of Therapy: Third Line and Beyond Intent of Therapy: Non-Curative / Palliative Intent, Discussed with Patient

## 2022-01-24 NOTE — Progress Notes (Signed)
[place]   [phone] [phone]) [phone]   Clinic Follow up Note   Patient Care Team: [name] as PCP - General (Physician Assistant) [name], MD as PCP - Cardiology (Cardiology) [name], MD as Consulting Physician (Hematology) [name], MD as Consulting Physician (General Surgery) [name], [facility] as Pharmacist (Pharmacist) Jacelyn Pi, MD as Referring Physician (Endocrinology)  Date of Service:  01/24/2022  I connected with Kristin Ward on 01/24/2022 at 10:00 AM EDT by telephone visit and verified that I am speaking with the correct person using two identifiers.  I discussed the limitations, risks, security and privacy concerns of performing an evaluation and management service by telephone and the availability of in person appointments. I also discussed with the patient that there may be a patient responsible charge related to this service. The patient expressed understanding and agreed to proceed.   Other persons participating in the visit and their role in the encounter:  none  Patient's location:  [place] (camping) Provider's location:  my office  CHIEF COMPLAINT: f/u of metastatic breast cancer  CURRENT THERAPY:  To start palliative radiation, 01/28/22 - 02/18/22  ASSESSMENT & PLAN:  Kristin Ward is a 66 y.o. female with   1. Left breast invasive ductal carcinoma, stage IB, p(T1cN0M0), Triple negative, Grade 3, in 2018, metastatic disease in 08/2020 ER 20% weakly + PR/HER2 (0) negative, PD-L1 0%, genetics (-) -diagnosed with triple negative breast cancer in 05/2017. She is s/p left breast lumpectomy, adjuvant chemo TC and Radiation.  -Genetic testing was negative for pathogenetic mutations.  -Due to new right gluteal pain, she underwent biopsy on 09/02/20 which showed metastatic carcinoma, consistent with breast primary. ER 20% positive, PR and HER2 negative.  -she received 4 cycles of Taxol 09/26/20  - 12/18/20, discontinued due to worsening neuropathy. She then took Xeloda from 01/01/21 - 06/2021. -she switched to [place] on 07/03/21 due to disease progression. She again experienced nausea as well as severe diarrhea after cycle 2, but was able to recover well. I reduced her dose from C4 and discontinued dexa. Chemo was held in Feb 2023 due to her uncontrolled hyperglycemia.  -restaging CT CAP on 11/27/21 showed: stable right iliac wing lesion; increase in volume of inguinal lymph nodes, overall similar from prior and improved from 06/2021. -her CA 27.29 initially responded well to treatment but has been rising lately, up to 62.1 on 01/11/22. -biopsy of a right inguinal lymph node on 01/15/22 showed adenocarcinoma, consistent with metastatic breast carcinoma. Prognostic panel confirmed triple negative disease. I reviewed the results with her today. -she is scheduled to receive palliative radiation under Dr. Lisbeth Renshaw 01/28/22 - 02/18/22.  -I discussed changing her treatment-- I recommend changing to Enhertu, based on HER2 1+ low expression on her recent lymph node biopsy.  I discussed the benefit and the potential side effects, which include but not limited to, fatigue, cytopenias, nausea, diarrhea, skin rash, cardiomyopathy, interstitial lung disease which could be severe and life-threatening, neuropathy, etc.  Patient voiced good understanding and agrees to proceed. -We will get back baseline echocardiogram before first dose. -Plan to start Enhertu towards the end of her radiation, in about 3 weeks   2. Diabetes and uncontrolled hyperglycemia -worsening hyperglycemia lately, probably related to dexa (before chemo) and Trodelvy  -her hyperglycemia is now better controlled on 25u Lantus and sliding scale insulin.   3. Right hip pain, Right iliac bone met, Vit D deficiency -She had radiating right hip  pain since 2020 from right iliac wing mass -She completed Palliative radiation to right iliac 09/04/20 -  09/22/20 with Dr. Lisbeth Renshaw, and her pain is much improved.  -Given localized bone met, she started Zometa q54month on 11/20/20. Most recent dose 08/03/21 -vit D has remained low, most recently 20.86 on 08/17/21.   4. G2 peripheral Neuropathy  -S/p C2D8 Taxol she developed mild tingling in her hands and feet.  -She uses ice bags at home as needed.  -She continues to have moderate numbness in her hands and light tingling in her feet. She can take B12 or acupuncture.  -Taxol stopped after 12/18/20. Her neuropathy is improving.  -Overall stable   5. DM, HTN, COPD, retinitis pigmentosa with limited vision, Chronic combined systolic and diastolic heart failure, EF 45-50% -Continue to follow-up with PCP Dr LRedmond Schooland cardiologist Dr HEllyn Hack -she is legally blind   6. Goal of care discussion  -The patient understands the goal of care is palliative. -she is full code now      Plan -palliative RT starting 01/28/22 -Plan to stop Trodelvy, and switch to Enhertu in 3 weeks -Echocardiogram in the next few weeks -Lab and follow-up before the first dose Enhertu    No problem-specific Assessment & Plan notes found for this encounter.   SUMMARY OF ONCOLOGIC HISTORY: Oncology History Overview Note  Cancer Staging Malignant neoplasm of lower-outer quadrant of left breast of female, estrogen receptor negative ([place] Staging form: Breast, AJCC 8th Edition - Clinical stage from 06/06/2017: Stage IB (cT1c, cN0, cM0, G3, ER-, PR-, HER2-) - Signed by FTruitt Merle MD on 06/15/2017 Nuclear grade: G3 Histologic grading system: 3 grade system - Pathologic stage from 06/26/2017: Stage IB (pT1c, pN0, cM0, G3, ER-, PR-, HER2-) - Signed by FTruitt Merle MD on 07/10/2017 Neoadjuvant therapy: No Nuclear grade: G3 Multigene prognostic tests performed: None Histologic grading system: 3 grade system Laterality: Left     Malignant neoplasm of lower-outer quadrant of left breast of female, estrogen receptor negative ([place]   05/30/2017 Mammogram   Diagnostic mammo and [place]PRESSION: 1. Highly suspicious 1.4 cm mass in the slightly lower slightly outer left breast -tissue sampling recommended. 2. Indeterminate 0.5 mm mass in the slightly lower slightly outer left breast -tissue sampling recommended. 3. At least 2 left axillary lymph nodes with borderline cortical thickness.   06/06/2017 Initial Biopsy   Diagnosis 1. Breast, left, needle core biopsy, 5:30 o'clock - INVASIVE DUCTAL CARCINOMA, G3 2. Lymph node, needle/core biopsy, left axillary - NO CARCINOMA IDENTIFIED IN ONE LYMPH NODE (0/1)   06/06/2017 Initial Diagnosis   Malignant neoplasm of lower-outer quadrant of left breast of female, estrogen receptor negative ([place]   06/06/2017 Receptors her2   Estrogen Receptor: 0%, NEGATIVE Progesterone Receptor: 0%, NEGATIVE Proliferation Marker Ki67: 70%   06/26/2017 Surgery   LEFT BREAST LUMPECTOMY WITH RADIOACTIVE SEED AND SENTINEL LYMPH NODE BIOPSY ERAS  PATHWAY AND INSERTION PORT-A-CATH By Dr. BBarry Dieneson 06/26/17    06/26/2017 Pathology Results   Diagnosis 06/26/17  1. Breast, lumpectomy, Left - INVASIVE DUCTAL CARCINOMA, GRADE III/III, SPANNING 1.2 CM. - THE SURGICAL RESECTION MARGINS ARE NEGATIVE FOR CARCINOMA. - SEE ONCOLOGY TABLE BELOW. 2. Lymph node, sentinel, biopsy, Left axillary #1 - THERE IS NO EVIDENCE OF CARCINOMA IN 1 OF 1 LYMPH NODE (0/1). 3. Lymph node, sentinel, biopsy, Left axillary #2 - THERE IS NO EVIDENCE OF CARCINOMA IN 1 OF 1 LYMPH NODE (0/1). 4. Lymph node, sentinel, biopsy, Left axillary #3 - THERE IS NO EVIDENCE  OF CARCINOMA IN 1 OF 1 LYMPH NODE (0/1).    07/25/2017 - 09/29/2017 Chemotherapy   Adjuvant cytoxan and docetaxel (TC) every 3 weekd for 4 cycles     11/19/2017 - 12/17/2017 Radiation Therapy   Adjuvant breast radiation Left breast treated to 42.5 Gy with 17 fx of 2.5 Gy followed by a boost of 7.5 Gy with 3 fx of 2.5 Gy      02/2019 Procedure   She had PAC  removal in 02/2019.    07/23/2020 Imaging   MRI Lumbar Spine 07/23/20 IMPRESSION: 1. No significant disc herniation, spinal canal or neural foraminal stenosis at any level. 2. Moderate facet degenerative changes at L3-4, L4-5 and L5-S1. 3. Multiple enlarged retroperitoneal and right iliac lymph nodes. Recommend correlation with CT of the abdomen and pelvis with contrast.   08/15/2020 Imaging   CT AP 08/15/20  IMPRESSION: 1. Right iliac and periaortic adenopathy is noted concerning for metastatic disease or lymphoma. Also noted is abnormal soft tissue mass anterior and posterior to the right iliac wing concerning for malignancy or metastatic disease. MRI is recommended for further evaluation. 2. Irregular lucency with sclerotic margins is seen involving the right iliac crest concerning for possible lytic lesion. MRI may be performed for further evaluation. 3. Enlarged fibroid uterus. 4. Small gallstone. 5. Aortic atherosclerosis.   08/22/2020 Pathology Results   FINAL MICROSCOPIC DIAGNOSIS: 08/22/20  A. NEEDLE CORE, RIGHT GLUTEUS MINIMUMUS, BIOPSY:  -  Metastatic carcinoma  -  See comment   COMMENT:  By immunohistochemistry, the neoplastic cells are positive for  cytokeratin-7 and have weak patchy positivity for GATA3 but are negative for cytokeratin-20 and GCDFP.  The combined morphology and immunophenotype, support metastasis from the patient's known breast primary carcinoma.   ADDENDUM:  PROGNOSTIC INDICATOR RESULTS:  The tumor cells are NEGATIVE for Her2 (0).  Estrogen Receptor:       POSITIVE, 20%, WEAK STAINING  Progesterone Receptor:   NEGATIVE   PD-L1 (0) negative    09/04/2020 - 09/22/2020 Radiation Therapy   Palliative radiation to right iliac 09/04/20 - 09/22/20, Dr. Lisbeth Renshaw   09/26/2020 -  Chemotherapy   first line weekly Taxol 3 weeks on/1 week off starting 09/26/20 --Reduced to 2 weeks on/1 week off starting with C3 due to neuropathy. Stopped after C4 on 12/18/20 due to  neuratrophy.  --Switched to Xeloda on 01/01/21 at 1563m in the AM and 20042min the PM for 2 weeks on/1 week off     10/13/2020 Procedure    PAC placement on 10/13/20.    04/10/2021 Imaging   CT CAP  IMPRESSION: 1. Enlarging abdominopelvic retroperitoneal and right inguinal adenopathy, compatible with disease progression. 2. Lytic metastasis in the right iliac wing, similar. 3. Hepatomegaly. 4. Cholelithiasis. 5. Enlarged fibroid uterus. 6.  Aortic atherosclerosis (ICD10-I70.0).   06/18/2021 Imaging   CT CAP  IMPRESSION: 1. Today's study demonstrates progression of metastatic disease as evidence by increased number and size of numerous enlarged lymph nodes in the retroperitoneum, along the right pelvic sidewall, and in the upper right thigh. 2. Osseous metastases in the bony pelvis appear similar to the prior examination. No definite new osseous lesions are otherwise noted. 3. Fibroid uterus again noted. 4. Cholelithiasis without evidence of acute cholecystitis. 5. Aortic atherosclerosis, in addition to left anterior descending coronary artery disease. Please note that although the presence of coronary artery calcium documents the presence of coronary artery disease, the severity of this disease and any potential stenosis cannot be assessed on this  non-gated CT examination. Assessment for potential risk factor modification, dietary therapy or pharmacologic therapy may be warranted, if clinically indicated. 6. Additional incidental findings, as above.   06/20/2021 Imaging   Bone Scan  IMPRESSION: No osseous uptake suspicious for recurrent/progressive metastatic disease.   07/03/2021 -  Chemotherapy   Patient is on Treatment Plan : BREAST METASTATIC Sacituzumab govitecan-hziy [name]) q21d     09/27/2021 Imaging   EXAM: CT CHEST, ABDOMEN, AND PELVIS WITH CONTRAST  IMPRESSION: 1. Marked interval improvement in previously demonstrated lymphadenopathy in the abdomen and pelvis.  A few prominent right inguinal lymph nodes remain. 2. Stable osseous metastatic disease in the pelvis. 3. No evidence of local recurrence or other metastatic disease. 4. Stable incidental findings including cholelithiasis, uterine fibroids and Aortic Atherosclerosis (ICD10-I70.0).  ADDENDUM: Not mentioned in the original impression is a possible nonocclusive fibrin sheath along the distal aspect of the Port-A-Cath.   09/27/2021 Imaging   EXAM: NUCLEAR MEDICINE WHOLE BODY BONE SCAN  IMPRESSION: Stable examination, without abnormal osseous uptake.   11/27/2021 Imaging   CT AP IMPRESSION: 1. Stable lytic and sclerotic lesion in the RIGHT iliac wing. 2. No evidence of visceral metastasis or adenopathy in the abdomenpelvis. 3. Leiomyomatous uterus.  ADDENDUM REPORT: 11/29/2021 09:33 Upon further review and discussion with Cira Rue NP, the RIGHT inguinal lymph nodes previous described have increased in volume. For example 14 mm node (image 91/2) is increased from 9 mm. Adjacent 14 mm node on image 86 is increased from 8 mm. Additionally, LEFT common iliac lymph node is also slightly increased measuring 10 mm (46/2) compared to 7 mm on comparison exam from 09/27/2021. These sites demonstrates similar but greater lymphadenopathy on CT 06/18/2021.      INTERVAL HISTORY:  Kristin Ward was contacted for a follow up of metastatic breast cancer. She was last seen by Dr. Chryl Heck in my absence on 01/11/22.  She reports she is feeling well overall. She is currently camping in [facility].   All other systems were reviewed with the patient and are negative.  MEDICAL HISTORY:  Past Medical History:  Diagnosis Date   Abnormal x-ray of lungs with single pulmonary nodule 03/15/2016   per records from [place]    Anemia    Asthma    Cholelithiasis 05/19/2017   On CT   Coronary artery calcification seen on CAT scan 05/19/2017   Diabetes mellitus without complication ([place])    type 2   Diabetes  mellitus, new onset ([place]) 01/20/2019   Dilated idiopathic cardiomyopathy ([place]) 12/2015   EF 15-20%. Diagnosed in [facility]   Headache    NONE RECENT   Hypertension    Lymph nodes enlarged 07/25/2020   Malignant neoplasm of lower-outer quadrant of left breast of female, estrogen receptor negative ([place]) 06/11/2017   left breast   Mixed hyperlipidemia 06/17/2018   partially blind    Personal history of chemotherapy 2018-2019   Personal history of radiation therapy 2019   Retinitis pigmentosa    Retroperitoneal lymphadenopathy 07/25/2020   Uveitis     SURGICAL HISTORY: Past Surgical History:  Procedure Laterality Date   brain cyst removed  2007   to help with headaches per patient , aspirated    BREAST BIOPSY Left 06/06/2017   x2   BREAST CYST ASPIRATION Left 06/06/2017   BREAST LUMPECTOMY Left 06/26/2017   BREAST LUMPECTOMY WITH RADIOACTIVE SEED AND SENTINEL LYMPH NODE BIOPSY Left 06/26/2017   Procedure: BREAST LUMPECTOMY WITH RADIOACTIVE SEED AND SENTINEL LYMPH NODE BIOPSY ERAS  PATHWAY;  Surgeon: [name], MD;  Location: [facility];  Service: General;  Laterality: Left;  pec block   FINE NEEDLE ASPIRATION Left 02/23/2019   Procedure: FINE NEEDLE ASPIRATION;  Surgeon: [name], MD;  Location: [facility];  Service: General;  Laterality: Left;  Asiration of left breast seroma    IR IMAGING GUIDED PORT INSERTION  10/13/2020   NASAL TURBINATE REDUCTION     PORT-A-CATH REMOVAL Right 02/23/2019   Procedure: REMOVAL PORT-A-CATH;  Surgeon: [name], MD;  Location: [place] [facility];  Service: General;  Laterality: Right;   PORTACATH PLACEMENT N/A 06/26/2017   Procedure: INSERTION PORT-A-CATH;  Surgeon: [name], MD;  Location: [place];  Service: General;  Laterality: N/A;   TRANSTHORACIC ECHOCARDIOGRAM  12/2015   A) [facility] May 2017: Mild concentric LVH. Global hypokinesis. GR daily. EF 18% severe LA dilation. Mitral annular  dilatation with papillary muscle dysfunction and moderate MR. Dilated IVC consistent with elevated RAP.   TRANSTHORACIC ECHOCARDIOGRAM  05/2017; 08/2017   a) Mild concentric LVH.  EF 45 to 50% with diffuse hypokinesis.  GR 1 DD.  Mild aortic root dilation.;; b)  Mild LVH EF 45 to 50%.  Diffuse HK.  No significant valve disease.  No significant change.    I have reviewed the social history and family history with the patient and they are unchanged from previous note.  ALLERGIES:  is allergic to morphine and related, latex, tylenol with codeine #3 [acetaminophen-codeine], and penicillins.  MEDICATIONS:  Current Outpatient Medications  Medication Sig Dispense Refill   Accu-Chek FastClix Lancets MISC TEST TWICE A DAY. PT USES AN ACCU-CHEK GUIDE ME METER 102 each 2   aspirin 81 MG chewable tablet Chew 81 mg by mouth daily.     atorvastatin (LIPITOR) 20 MG tablet Take 1 tablet (20 mg total) by mouth daily. 90 tablet 3   brimonidine (ALPHAGAN) 0.2 % ophthalmic solution Place 1 drop into both eyes 2 (two) times daily.     carvedilol (COREG) 12.5 MG tablet Take 1 tablet (12.5 mg total) by mouth 2 (two) times daily with a meal. 180 tablet 1   cholecalciferol (VITAMIN D3) 25 MCG (1000 UNIT) tablet Take 2,000 Units by mouth daily.     Continuous Blood Gluc Receiver (DEXCOM G7 RECEIVER) DEVI 1 Device by Does not apply route 2 (two) times daily. 1 each 1   Continuous Blood Gluc Sensor (DEXCOM G7 SENSOR) MISC Use as directed, replace every 10 DAYS 3 each 10   furosemide (LASIX) 20 MG tablet TAKE 2 TABLETS (40 MG TOTAL) BY MOUTH AS NEEDED. FOR WEIGHT GAIN 3 TO 4 LBS. 90 tablet 3   HUMALOG KWIKPEN 100 UNIT/ML KwikPen Inject 3-10 Units into the skin 3 (three) times daily before meals. Per sliding scale     ibuprofen (ADVIL) 800 MG tablet Take 1 tablet (800 mg total) by mouth every 8 (eight) hours as needed. 30 tablet 2   insulin glargine (LANTUS SOLOSTAR) 100 UNIT/ML Solostar Pen Inject 15 Units into the skin at  bedtime. (Patient taking differently: Inject 15-25 Units into the skin See admin instructions. Chemo 15 units in the morning, 25 units at bedtime, Non Chemo 25 units at bedtime) 15 mL 5   Lancets Misc. (ACCU-CHEK SOFTCLIX LANCET DEV) KIT 1 Units by Does not apply route 2 (two) times daily. Check FSBS BID - Include strips # 50 with 5 refills, Lancets #50 with 5 refills DX: Type 2 DM - ICD 10: E11.9 1 kit  0   lidocaine-prilocaine (EMLA) cream Apply 1 application topically as needed. 30 g 0   linaclotide (LINZESS) 72 MCG capsule Take 1 capsule (72 mcg total) by mouth daily before breakfast. MUST HAVE APPOINTMENT FOR REFILLS (Patient taking differently: Take 72 mcg by mouth daily as needed (constipation). MUST HAVE APPOINTMENT FOR REFILLS) 30 capsule 1   metFORMIN (GLUCOPHAGE) 1000 MG tablet Take 1 tablet (1,000 mg total) by mouth 2 (two) times daily with a meal. 180 tablet 3   Multiple Vitamins-Minerals (WOMENS MULTI PO) Take 15 mLs by mouth daily.     ondansetron (ZOFRAN) 8 MG tablet Take 1 tablet (8 mg total) by mouth every 8 (eight) hours as needed for nausea or vomiting (begin on day 3 after chemo). 30 tablet 1   oxyCODONE (OXY IR/ROXICODONE) 5 MG immediate release tablet Take 1 tablet (5 mg total) by mouth every 8 (eight) hours as needed for severe pain. 60 tablet 0   oxyCODONE-acetaminophen (PERCOCET/ROXICET) 5-325 MG tablet Take 1 tablet by mouth every 6 (six) hours as needed for severe pain.     pantoprazole (PROTONIX) 40 MG tablet TAKE 1 TABLET BY MOUTH EVERY DAY (Patient taking differently: Take 40 mg by mouth daily as needed (acid reflux).) 90 tablet 1   prochlorperazine (COMPAZINE) 10 MG tablet Take 1 tablet (10 mg total) by mouth every 6 (six) hours as needed (Nausea or vomiting). 30 tablet 1   sacubitril-valsartan (ENTRESTO) 97-103 MG Take 1 tablet by mouth 2 (two) times daily. 180 tablet 3   Semaglutide (RYBELSUS) 7 MG TABS Take 7 mg by mouth daily. 90 tablet 3   spironolactone (ALDACTONE)  25 MG tablet Take 25 mg by mouth daily.     vitamin B-12 (CYANOCOBALAMIN) 1000 MCG tablet Take 1,000 mcg by mouth daily.     vitamin C (ASCORBIC ACID) 500 MG tablet Take 500 mg by mouth in the morning, at noon, in the evening, and at bedtime.     zinc gluconate 50 MG tablet Take 50 mg by mouth daily.     Current Facility-Administered Medications  Medication Dose Route Frequency Provider Last Rate Last Admin   insulin glulisine (APIDRA) injection 6 Units  6 Units Subcutaneous Once Francis Gaines B, PA-C        PHYSICAL EXAMINATION: ECOG PERFORMANCE STATUS: 1 - Symptomatic but completely ambulatory  There were no vitals filed for this visit. Wt Readings from Last 3 Encounters:  01/15/22 214 lb (97.1 kg)  01/11/22 216 lb 8 oz (98.2 kg)  01/01/22 212 lb (96.2 kg)     No vitals taken today, Exam not performed today  LABORATORY DATA:  I have reviewed the data as listed    Latest Ref Rng & Units 01/11/2022    8:28 AM 12/27/2021    8:59 AM 12/21/2021    8:22 AM  CBC  WBC 4.0 - 10.5 K/uL 5.6  3.8  5.0   Hemoglobin 12.0 - 15.0 g/dL 10.3  9.7  10.2   Hematocrit 36.0 - 46.0 % 30.8  29.1  31.1   Platelets 150 - 400 K/uL 337  233  240         Latest Ref Rng & Units 01/11/2022    8:28 AM 12/27/2021    8:59 AM 12/21/2021    8:22 AM  CMP  Glucose 70 - 99 mg/dL 91  105  144   BUN 8 - 23 mg/dL _0 Creatinine 0.44 - 1.00 mg/dL 0.68  0.62  0.69   Sodium 135 - 145 mmol/L 139  138  141   Potassium 3.5 - 5.1 mmol/L 4.1  3.9  3.9   Chloride 98 - 111 mmol/L 106  107  108   CO2 22 - 32 mmol/L _0 Calcium 8.9 - 10.3 mg/dL 9.7  9.0  9.2   Total Protein 6.5 - 8.1 g/dL 7.4  7.2  7.4   Total Bilirubin 0.3 - 1.2 mg/dL 0.5  0.4  0.7   Alkaline Phos 38 - 126 U/L 123  91  94   AST 15 - 41 U/L 36  12  53   ALT 0 - 44 U/L 40  12  34       RADIOGRAPHIC STUDIES: I have personally reviewed the radiological images as listed and agreed with the findings in the report. No results found.     Orders Placed This Encounter  Procedures   ECHOCARDIOGRAM COMPLETE    Standing Status:   Future    Standing Expiration Date:   01/25/2023    Order Specific Question:   Where should this test be performed    Answer:   [place]    Order Specific Question:   Perflutren DEFINITY (image enhancing agent) should be administered unless hypersensitivity or allergy exist    Answer:   Administer Perflutren    Order Specific Question:   Is a special reader required? (athlete or structural heart)    Answer:   No    Order Specific Question:   Does this study need to be read by the Structural team/Level 3 readers?    Answer:   No    Order Specific Question:   Reason for exam-Echo    Answer:   Chemo  [id number]   All questions were answered. The patient knows to call the clinic with any problems, questions or concerns. No barriers to learning was detected. The total time spent in the appointment was 30 minutes.     [name], MD 01/24/2022   I, Wilburn Mylar, am acting as scribe for [name], MD.   I have reviewed the above documentation for accuracy and completeness, and I agree with the above.

## 2022-01-25 ENCOUNTER — Telehealth: Payer: Self-pay

## 2022-01-25 ENCOUNTER — Telehealth: Payer: Self-pay | Admitting: Hematology

## 2022-01-25 DIAGNOSIS — C50512 Malignant neoplasm of lower-outer quadrant of left female breast: Secondary | ICD-10-CM | POA: Diagnosis not present

## 2022-01-25 DIAGNOSIS — C774 Secondary and unspecified malignant neoplasm of inguinal and lower limb lymph nodes: Secondary | ICD-10-CM | POA: Diagnosis not present

## 2022-01-25 DIAGNOSIS — Z510 Encounter for antineoplastic radiation therapy: Secondary | ICD-10-CM | POA: Diagnosis not present

## 2022-01-25 DIAGNOSIS — C50912 Malignant neoplasm of unspecified site of left female breast: Secondary | ICD-10-CM | POA: Diagnosis not present

## 2022-01-25 NOTE — Telephone Encounter (Signed)
Recv'd fax stating P.A. required for Insulin Kristin Ward (generic) but Lantus Solostar covered.  Called pharmacy and informed

## 2022-01-25 NOTE — Telephone Encounter (Signed)
Scheduled follow-up appointment per 6/15 los. Patient is aware.

## 2022-01-28 ENCOUNTER — Other Ambulatory Visit: Payer: Self-pay

## 2022-01-28 ENCOUNTER — Ambulatory Visit
Admission: RE | Admit: 2022-01-28 | Discharge: 2022-01-28 | Disposition: A | Discharge status: Home / Self Care | Payer: Medicare Other | Source: Ambulatory Visit | Attending: Radiation Oncology | Admitting: Radiation Oncology

## 2022-01-28 DIAGNOSIS — C50912 Malignant neoplasm of unspecified site of left female breast: Secondary | ICD-10-CM | POA: Diagnosis not present

## 2022-01-28 DIAGNOSIS — C774 Secondary and unspecified malignant neoplasm of inguinal and lower limb lymph nodes: Secondary | ICD-10-CM | POA: Diagnosis not present

## 2022-01-28 DIAGNOSIS — C50512 Malignant neoplasm of lower-outer quadrant of left female breast: Secondary | ICD-10-CM | POA: Diagnosis not present

## 2022-01-28 DIAGNOSIS — Z510 Encounter for antineoplastic radiation therapy: Secondary | ICD-10-CM | POA: Diagnosis not present

## 2022-01-28 LAB — RAD ONC ARIA SESSION SUMMARY
Course Elapsed Days: 0
Plan Fractions Treated to Date: 1
Plan Prescribed Dose Per Fraction: 3 Gy
Plan Total Fractions Prescribed: 10
Plan Total Prescribed Dose: 30 Gy
Reference Point Dosage Given to Date: 3 Gy
Reference Point Session Dosage Given: 3 Gy
Session Number: 1

## 2022-01-28 NOTE — Telephone Encounter (Signed)
Left message to call back  

## 2022-01-29 ENCOUNTER — Ambulatory Visit
Admission: RE | Admit: 2022-01-29 | Discharge: 2022-01-29 | Disposition: A | Discharge status: Home / Self Care | Payer: Medicare Other | Source: Ambulatory Visit | Attending: Radiation Oncology | Admitting: Radiation Oncology

## 2022-01-29 ENCOUNTER — Other Ambulatory Visit: Payer: Self-pay

## 2022-01-29 DIAGNOSIS — C50912 Malignant neoplasm of unspecified site of left female breast: Secondary | ICD-10-CM | POA: Diagnosis not present

## 2022-01-29 DIAGNOSIS — C50512 Malignant neoplasm of lower-outer quadrant of left female breast: Secondary | ICD-10-CM | POA: Diagnosis not present

## 2022-01-29 DIAGNOSIS — Z510 Encounter for antineoplastic radiation therapy: Secondary | ICD-10-CM | POA: Diagnosis not present

## 2022-01-29 DIAGNOSIS — C774 Secondary and unspecified malignant neoplasm of inguinal and lower limb lymph nodes: Secondary | ICD-10-CM | POA: Diagnosis not present

## 2022-01-29 LAB — RAD ONC ARIA SESSION SUMMARY
Course Elapsed Days: 1
Plan Fractions Treated to Date: 2
Plan Prescribed Dose Per Fraction: 3 Gy
Plan Total Fractions Prescribed: 10
Plan Total Prescribed Dose: 30 Gy
Reference Point Dosage Given to Date: 6 Gy
Reference Point Session Dosage Given: 3 Gy
Session Number: 2

## 2022-01-29 IMAGING — CT CT CHEST-ABD-PELV W/ CM
3 of 5 series · 14 of 36 positions shown, 16 images · IV contrast (APPLIED)
Comparison: 01/17/2021.

CLINICAL DATA: Left breast cancer with recurrence, ongoing
chemotherapy.

EXAM:
CT CHEST, ABDOMEN, AND PELVIS WITH CONTRAST
TECHNIQUE: Multidetector CT imaging of the chest, abdomen and pelvis was
performed following the standard protocol during bolus
administration of intravenous contrast.
CONTRAST:  80mL OMNIPAQUE IOHEXOL 350 MG/ML SOLN

[Series 2: cap with · axial · 0.79mm/px · z∈[+1176,+1702]mm · 9 of 133 slices shown, 11 images]
[im 14/133  mediastinal]
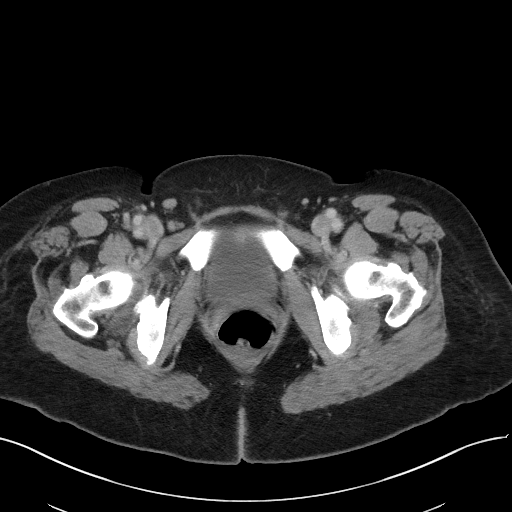
[im 14/133  bone]
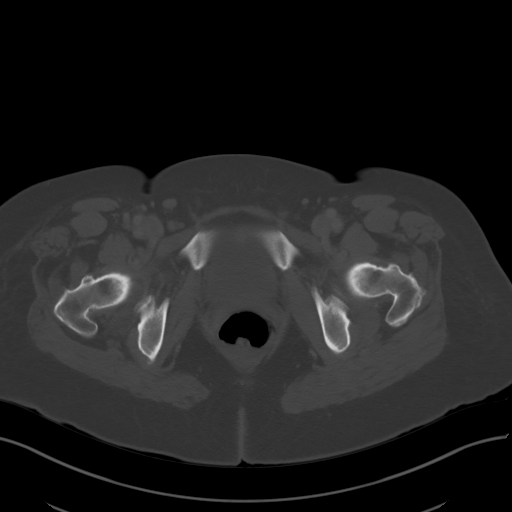
[im 27/133  mediastinal]
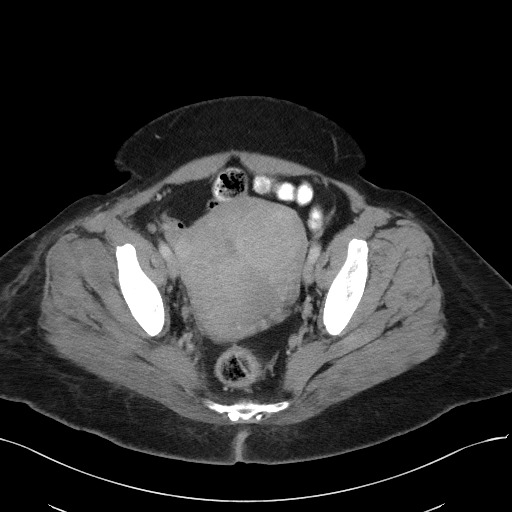
[im 40/133  mediastinal]
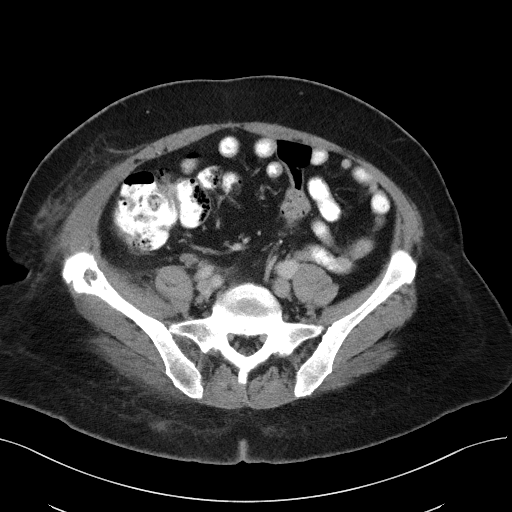
[im 53/133  mediastinal]
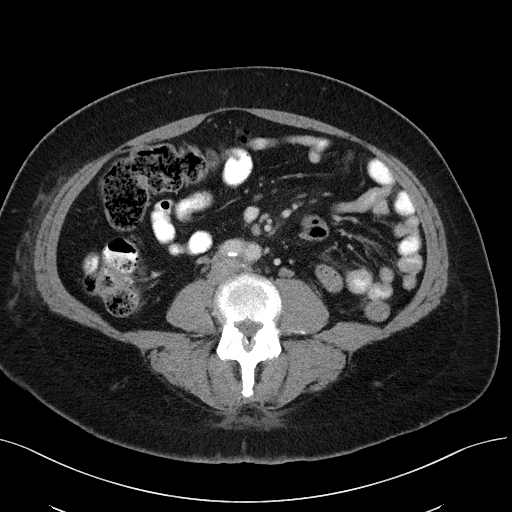
[im 67/133  mediastinal]
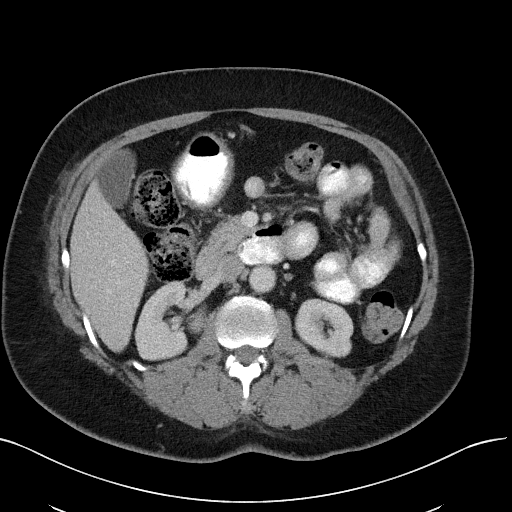
[im 80/133  mediastinal]
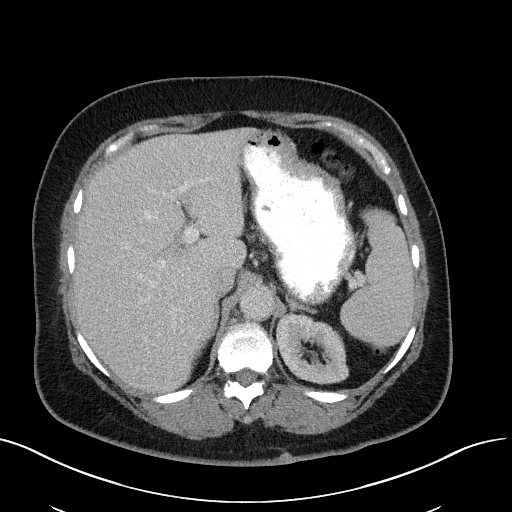
[im 93/133  mediastinal]
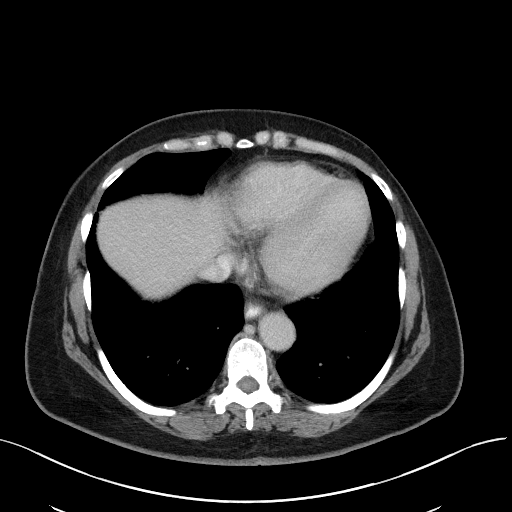
[im 106/133  mediastinal]
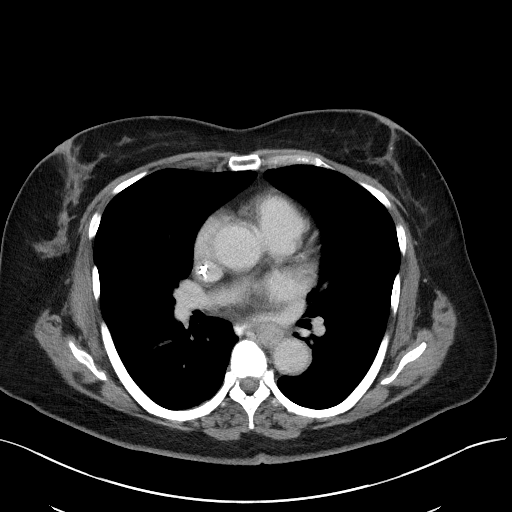
[im 119/133  mediastinal]
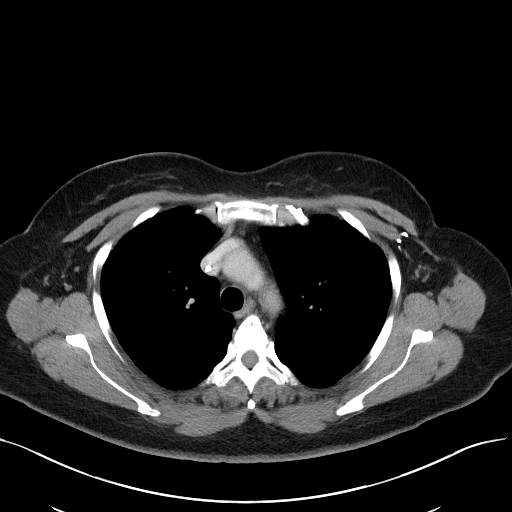
[im 119/133  bone]
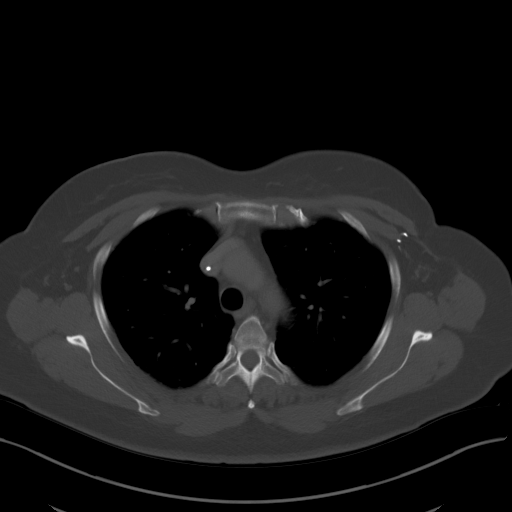

[Series 4: lung · axial · 0.79mm/px · z∈[+1468,+1518]mm · 2 of 165 slices shown]
[im 13/165  bone]
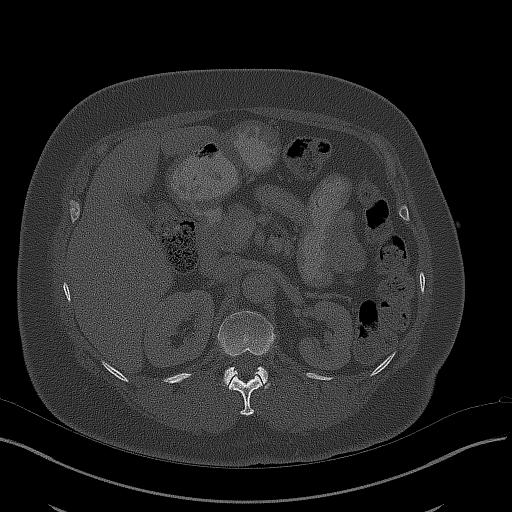
[im 38/165  bone]
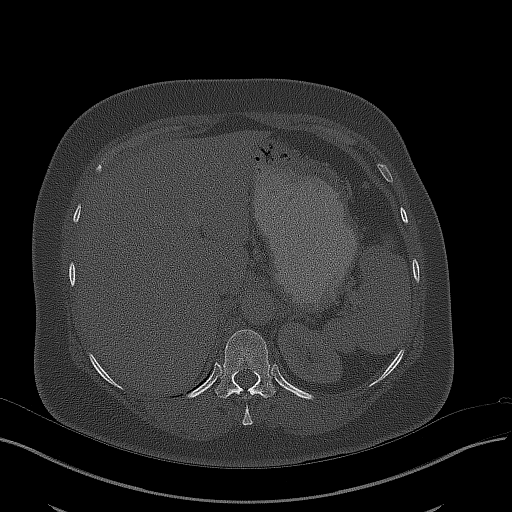

[Series 5: coronals · coronal · 0.82mm/px · 3 of 149 slices shown]
[im 30/149  mediastinal]
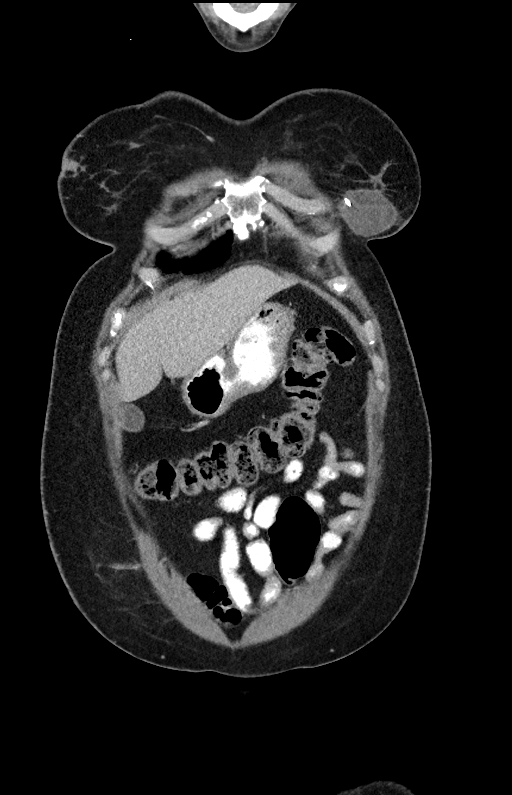
[im 60/149  mediastinal]
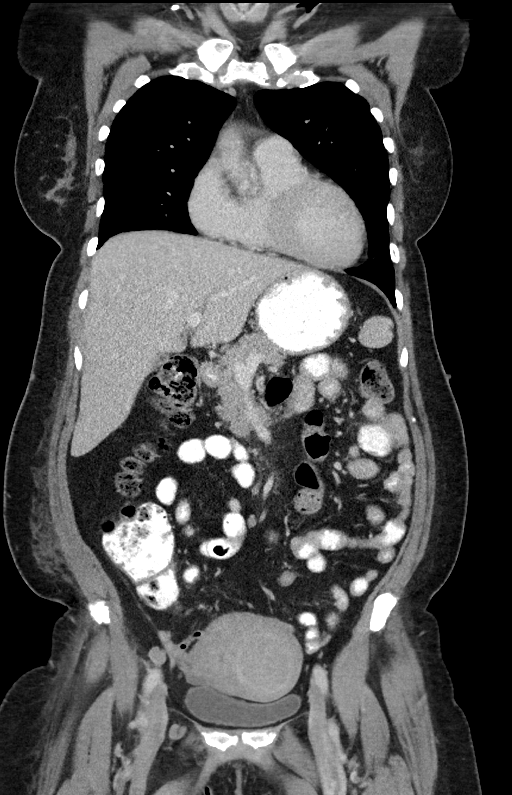
[im 89/149  mediastinal]
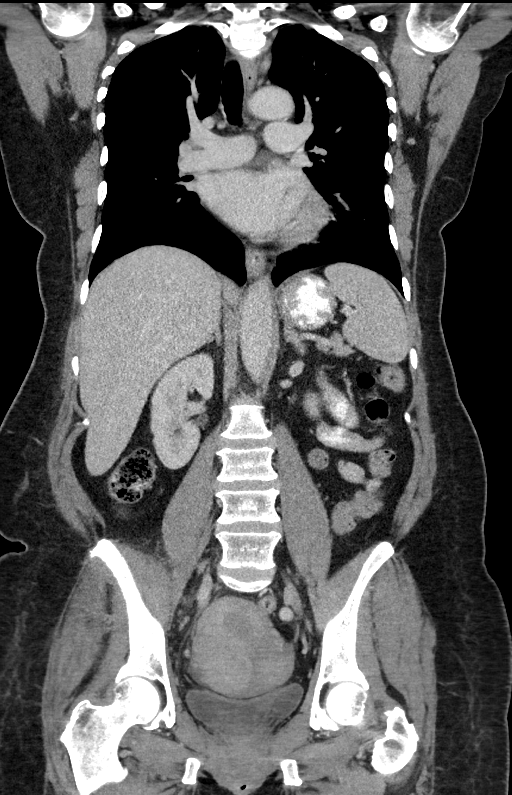

[14 of 36 positions shown; findings below may reference images not displayed]

FINDINGS: CT CHEST FINDINGS

Cardiovascular: Right IJ Port-A-Cath terminates in the right atrium.
Heart is enlarged. No pericardial effusion.

Mediastinum/Nodes: No pathologically enlarged mediastinal, hilar,
internal mammary or axillary lymph nodes. Surgical clips in the left
axilla. Esophagus is grossly unremarkable.

Lungs/Pleura: Mild biapical subpleural scarring. Mild dependent
atelectasis. 4 mm subpleural nodule in medial segment right middle
lobe (4/94), unchanged and likely a subpleural lymph node. Mild
subpleural reticulation in the anterior left upper lobe is
indicative of radiation therapy. Lungs are otherwise clear. No
pleural fluid. Airway is unremarkable.

Musculoskeletal: No worrisome lytic or sclerotic lesions.
Postoperative seroma in the left breast measures 3.3 x 4.5 cm.

CT ABDOMEN PELVIS FINDINGS

Hepatobiliary: Liver is enlarged, 20.3 cm, but otherwise
unremarkable. Gallstones. No biliary ductal dilatation.

Pancreas: Negative.

Spleen: Negative.

Adrenals/Urinary Tract: Adrenal glands and right kidney are
unremarkable. Scarring in the left kidney. Ureters are decompressed.
Bladder is grossly unremarkable.

Stomach/Bowel: Stomach is unremarkable. Duodenal diverticulum. Small
bowel, appendix and colon are otherwise unremarkable.

Vascular/Lymphatic: Atherosclerotic calcification of the aorta.
Abdominal retroperitoneal lymph nodes measure slightly larger. Index
01/17/2021. Right common iliac lymph nodes are stable to slightly
left enlarged, measuring up to 12 mm (2/87), previously 8 mm.
Subcentimeter right internal and external iliac chain lymph nodes
appear similar. Right inguinal lymph nodes measure up to 1.5 cm,
increased from 11 mm.

Reproductive: Uterus is enlarged and contains multiple heterogeneous
masses measuring up to approximately 5.0 cm, indicative of fibroids.
No adnexal mass.

Other: Tiny right inguinal hernia contains fat. No free fluid. Mild
small bowel mesenteric nodularity and haziness, similar.

Musculoskeletal: Lytic lesion in the lateral right iliac wing and
sclerotic lesion in the medial left iliac wing are unchanged. No new
worrisome lytic or sclerotic lesions.
IMPRESSION: 1. Enlarging abdominopelvic retroperitoneal and right inguinal
adenopathy, compatible with disease progression.
2. Lytic metastasis in the right iliac wing, similar.
3. Hepatomegaly.
4. Cholelithiasis.
5. Enlarged fibroid uterus.
6.  Aortic atherosclerosis ([id number]).

## 2022-01-30 ENCOUNTER — Ambulatory Visit
Admission: RE | Admit: 2022-01-30 | Discharge: 2022-01-30 | Disposition: A | Discharge status: Home / Self Care | Payer: Medicare Other | Source: Ambulatory Visit | Attending: Radiation Oncology | Admitting: Radiation Oncology

## 2022-01-30 ENCOUNTER — Other Ambulatory Visit: Payer: Self-pay

## 2022-01-30 DIAGNOSIS — Z510 Encounter for antineoplastic radiation therapy: Secondary | ICD-10-CM | POA: Diagnosis not present

## 2022-01-30 DIAGNOSIS — C50912 Malignant neoplasm of unspecified site of left female breast: Secondary | ICD-10-CM | POA: Diagnosis not present

## 2022-01-30 DIAGNOSIS — C50512 Malignant neoplasm of lower-outer quadrant of left female breast: Secondary | ICD-10-CM | POA: Diagnosis not present

## 2022-01-30 DIAGNOSIS — C774 Secondary and unspecified malignant neoplasm of inguinal and lower limb lymph nodes: Secondary | ICD-10-CM | POA: Diagnosis not present

## 2022-01-30 HISTORY — PX: TRANSTHORACIC ECHOCARDIOGRAM: SHX275

## 2022-01-30 LAB — RAD ONC ARIA SESSION SUMMARY
Course Elapsed Days: 2
Plan Fractions Treated to Date: 3
Plan Prescribed Dose Per Fraction: 3 Gy
Plan Total Fractions Prescribed: 10
Plan Total Prescribed Dose: 30 Gy
Reference Point Dosage Given to Date: 9 Gy
Reference Point Session Dosage Given: 3 Gy
Session Number: 3

## 2022-01-31 ENCOUNTER — Ambulatory Visit
Admission: RE | Admit: 2022-01-31 | Discharge: 2022-01-31 | Disposition: A | Discharge status: Home / Self Care | Payer: Medicare Other | Source: Ambulatory Visit | Attending: Radiation Oncology | Admitting: Radiation Oncology

## 2022-01-31 ENCOUNTER — Ambulatory Visit (HOSPITAL_COMMUNITY)
Admission: RE | Admit: 2022-01-31 | Discharge: 2022-01-31 | Disposition: A | Discharge status: Home / Self Care | Payer: Medicare Other | Source: Ambulatory Visit | Attending: Hematology | Admitting: Hematology

## 2022-01-31 ENCOUNTER — Other Ambulatory Visit: Payer: Self-pay

## 2022-01-31 DIAGNOSIS — Z01818 Encounter for other preprocedural examination: Secondary | ICD-10-CM | POA: Diagnosis not present

## 2022-01-31 DIAGNOSIS — I340 Nonrheumatic mitral (valve) insufficiency: Secondary | ICD-10-CM | POA: Diagnosis not present

## 2022-01-31 DIAGNOSIS — Z0189 Encounter for other specified special examinations: Secondary | ICD-10-CM

## 2022-01-31 DIAGNOSIS — Z171 Estrogen receptor negative status [ER-]: Secondary | ICD-10-CM | POA: Diagnosis not present

## 2022-01-31 DIAGNOSIS — I509 Heart failure, unspecified: Secondary | ICD-10-CM | POA: Diagnosis not present

## 2022-01-31 DIAGNOSIS — J449 Chronic obstructive pulmonary disease, unspecified: Secondary | ICD-10-CM | POA: Diagnosis not present

## 2022-01-31 DIAGNOSIS — E785 Hyperlipidemia, unspecified: Secondary | ICD-10-CM | POA: Diagnosis not present

## 2022-01-31 DIAGNOSIS — Z510 Encounter for antineoplastic radiation therapy: Secondary | ICD-10-CM | POA: Diagnosis not present

## 2022-01-31 DIAGNOSIS — I110 Hypertensive heart disease with heart failure: Secondary | ICD-10-CM | POA: Insufficient documentation

## 2022-01-31 DIAGNOSIS — Z87891 Personal history of nicotine dependence: Secondary | ICD-10-CM | POA: Insufficient documentation

## 2022-01-31 DIAGNOSIS — C50512 Malignant neoplasm of lower-outer quadrant of left female breast: Secondary | ICD-10-CM | POA: Diagnosis not present

## 2022-01-31 DIAGNOSIS — C774 Secondary and unspecified malignant neoplasm of inguinal and lower limb lymph nodes: Secondary | ICD-10-CM | POA: Diagnosis not present

## 2022-01-31 DIAGNOSIS — C50912 Malignant neoplasm of unspecified site of left female breast: Secondary | ICD-10-CM | POA: Diagnosis not present

## 2022-01-31 LAB — ECHOCARDIOGRAM COMPLETE
Calc EF: 37.1 %
MV M vel: 5.02 m/s
MV Peak grad: 100.8 mmHg
Radius: 0.3 cm
S' Lateral: 3.8 cm
Single Plane A2C EF: 33.4 %
Single Plane A4C EF: 39.8 %

## 2022-01-31 LAB — RAD ONC ARIA SESSION SUMMARY
Course Elapsed Days: 3
Plan Fractions Treated to Date: 4
Plan Prescribed Dose Per Fraction: 3 Gy
Plan Total Fractions Prescribed: 10
Plan Total Prescribed Dose: 30 Gy
Reference Point Dosage Given to Date: 12 Gy
Reference Point Session Dosage Given: 3 Gy
Session Number: 4

## 2022-01-31 NOTE — Progress Notes (Signed)
  Echocardiogram 2D Echocardiogram has been performed.  Kristin Ward M 01/31/2022, 9:28 AM

## 2022-02-01 ENCOUNTER — Ambulatory Visit: Payer: Medicare Other

## 2022-02-01 ENCOUNTER — Other Ambulatory Visit: Payer: Self-pay

## 2022-02-01 ENCOUNTER — Ambulatory Visit: Payer: Medicare Other | Admitting: Podiatry

## 2022-02-01 ENCOUNTER — Ambulatory Visit
Admission: RE | Admit: 2022-02-01 | Discharge: 2022-02-01 | Disposition: A | Discharge status: Home / Self Care | Payer: Medicare Other | Source: Ambulatory Visit | Attending: Radiation Oncology | Admitting: Radiation Oncology

## 2022-02-01 ENCOUNTER — Other Ambulatory Visit: Payer: Self-pay | Admitting: Radiation Oncology

## 2022-02-01 ENCOUNTER — Other Ambulatory Visit: Payer: Medicare Other

## 2022-02-01 DIAGNOSIS — C50912 Malignant neoplasm of unspecified site of left female breast: Secondary | ICD-10-CM | POA: Diagnosis not present

## 2022-02-01 DIAGNOSIS — C774 Secondary and unspecified malignant neoplasm of inguinal and lower limb lymph nodes: Secondary | ICD-10-CM | POA: Diagnosis not present

## 2022-02-01 DIAGNOSIS — R599 Enlarged lymph nodes, unspecified: Secondary | ICD-10-CM

## 2022-02-01 DIAGNOSIS — I890 Lymphedema, not elsewhere classified: Secondary | ICD-10-CM

## 2022-02-01 DIAGNOSIS — C50512 Malignant neoplasm of lower-outer quadrant of left female breast: Secondary | ICD-10-CM | POA: Diagnosis not present

## 2022-02-01 DIAGNOSIS — Z510 Encounter for antineoplastic radiation therapy: Secondary | ICD-10-CM | POA: Diagnosis not present

## 2022-02-01 LAB — RAD ONC ARIA SESSION SUMMARY
Course Elapsed Days: 4
Plan Fractions Treated to Date: 5
Plan Prescribed Dose Per Fraction: 3 Gy
Plan Total Fractions Prescribed: 10
Plan Total Prescribed Dose: 30 Gy
Reference Point Dosage Given to Date: 15 Gy
Reference Point Session Dosage Given: 3 Gy
Session Number: 5

## 2022-02-04 ENCOUNTER — Other Ambulatory Visit: Payer: Self-pay

## 2022-02-04 ENCOUNTER — Ambulatory Visit
Admission: RE | Admit: 2022-02-04 | Discharge: 2022-02-04 | Disposition: A | Discharge status: Home / Self Care | Payer: Medicare Other | Source: Ambulatory Visit | Attending: Radiation Oncology | Admitting: Radiation Oncology

## 2022-02-04 ENCOUNTER — Encounter: Payer: Self-pay | Admitting: Podiatry

## 2022-02-04 ENCOUNTER — Ambulatory Visit (INDEPENDENT_AMBULATORY_CARE_PROVIDER_SITE_OTHER): Payer: Medicare Other | Admitting: Podiatry

## 2022-02-04 DIAGNOSIS — C801 Malignant (primary) neoplasm, unspecified: Secondary | ICD-10-CM

## 2022-02-04 DIAGNOSIS — Z510 Encounter for antineoplastic radiation therapy: Secondary | ICD-10-CM | POA: Diagnosis not present

## 2022-02-04 DIAGNOSIS — G63 Polyneuropathy in diseases classified elsewhere: Secondary | ICD-10-CM

## 2022-02-04 DIAGNOSIS — C774 Secondary and unspecified malignant neoplasm of inguinal and lower limb lymph nodes: Secondary | ICD-10-CM | POA: Diagnosis not present

## 2022-02-04 DIAGNOSIS — M79675 Pain in left toe(s): Secondary | ICD-10-CM | POA: Diagnosis not present

## 2022-02-04 DIAGNOSIS — B351 Tinea unguium: Secondary | ICD-10-CM

## 2022-02-04 DIAGNOSIS — M79674 Pain in right toe(s): Secondary | ICD-10-CM

## 2022-02-04 DIAGNOSIS — C50512 Malignant neoplasm of lower-outer quadrant of left female breast: Secondary | ICD-10-CM | POA: Diagnosis not present

## 2022-02-04 DIAGNOSIS — C50912 Malignant neoplasm of unspecified site of left female breast: Secondary | ICD-10-CM | POA: Diagnosis not present

## 2022-02-04 DIAGNOSIS — E1165 Type 2 diabetes mellitus with hyperglycemia: Secondary | ICD-10-CM | POA: Diagnosis not present

## 2022-02-04 LAB — RAD ONC ARIA SESSION SUMMARY
Course Elapsed Days: 7
Plan Fractions Treated to Date: 6
Plan Prescribed Dose Per Fraction: 3 Gy
Plan Total Fractions Prescribed: 10
Plan Total Prescribed Dose: 30 Gy
Reference Point Dosage Given to Date: 18 Gy
Reference Point Session Dosage Given: 3 Gy
Session Number: 6

## 2022-02-05 ENCOUNTER — Other Ambulatory Visit: Payer: Self-pay

## 2022-02-05 ENCOUNTER — Ambulatory Visit
Admission: RE | Admit: 2022-02-05 | Discharge: 2022-02-05 | Disposition: A | Discharge status: Home / Self Care | Payer: Medicare Other | Source: Ambulatory Visit | Attending: Radiation Oncology | Admitting: Radiation Oncology

## 2022-02-05 DIAGNOSIS — C774 Secondary and unspecified malignant neoplasm of inguinal and lower limb lymph nodes: Secondary | ICD-10-CM | POA: Diagnosis not present

## 2022-02-05 DIAGNOSIS — C50912 Malignant neoplasm of unspecified site of left female breast: Secondary | ICD-10-CM | POA: Diagnosis not present

## 2022-02-05 DIAGNOSIS — C50512 Malignant neoplasm of lower-outer quadrant of left female breast: Secondary | ICD-10-CM | POA: Diagnosis not present

## 2022-02-05 DIAGNOSIS — Z510 Encounter for antineoplastic radiation therapy: Secondary | ICD-10-CM | POA: Diagnosis not present

## 2022-02-05 LAB — RAD ONC ARIA SESSION SUMMARY
Course Elapsed Days: 8
Plan Fractions Treated to Date: 7
Plan Prescribed Dose Per Fraction: 3 Gy
Plan Total Fractions Prescribed: 10
Plan Total Prescribed Dose: 30 Gy
Reference Point Dosage Given to Date: 21 Gy
Reference Point Session Dosage Given: 3 Gy
Session Number: 7

## 2022-02-06 ENCOUNTER — Ambulatory Visit
Admission: RE | Admit: 2022-02-06 | Discharge: 2022-02-06 | Disposition: A | Discharge status: Home / Self Care | Payer: Medicare Other | Source: Ambulatory Visit | Attending: Radiation Oncology | Admitting: Radiation Oncology

## 2022-02-06 ENCOUNTER — Ambulatory Visit (HOSPITAL_COMMUNITY)
Admission: RE | Admit: 2022-02-06 | Discharge: 2022-02-06 | Disposition: A | Discharge status: Home / Self Care | Payer: Medicare Other | Source: Ambulatory Visit | Attending: Internal Medicine | Admitting: Internal Medicine

## 2022-02-06 ENCOUNTER — Ambulatory Visit (HOSPITAL_BASED_OUTPATIENT_CLINIC_OR_DEPARTMENT_OTHER)
Admission: RE | Admit: 2022-02-06 | Discharge: 2022-02-06 | Disposition: A | Discharge status: Home / Self Care | Payer: Medicare Other | Source: Ambulatory Visit | Attending: Internal Medicine | Admitting: Internal Medicine

## 2022-02-06 ENCOUNTER — Encounter (HOSPITAL_COMMUNITY): Payer: Self-pay | Admitting: Internal Medicine

## 2022-02-06 ENCOUNTER — Other Ambulatory Visit: Payer: Self-pay

## 2022-02-06 VITALS — BP 180/100 | HR 79 | Wt 223.4 lb

## 2022-02-06 DIAGNOSIS — I5042 Chronic combined systolic (congestive) and diastolic (congestive) heart failure: Secondary | ICD-10-CM | POA: Diagnosis not present

## 2022-02-06 DIAGNOSIS — M7989 Other specified soft tissue disorders: Secondary | ICD-10-CM | POA: Insufficient documentation

## 2022-02-06 DIAGNOSIS — C50512 Malignant neoplasm of lower-outer quadrant of left female breast: Secondary | ICD-10-CM | POA: Insufficient documentation

## 2022-02-06 DIAGNOSIS — I5022 Chronic systolic (congestive) heart failure: Secondary | ICD-10-CM | POA: Insufficient documentation

## 2022-02-06 DIAGNOSIS — M79604 Pain in right leg: Secondary | ICD-10-CM | POA: Diagnosis not present

## 2022-02-06 DIAGNOSIS — H3552 Pigmentary retinal dystrophy: Secondary | ICD-10-CM | POA: Diagnosis not present

## 2022-02-06 DIAGNOSIS — Z853 Personal history of malignant neoplasm of breast: Secondary | ICD-10-CM | POA: Diagnosis not present

## 2022-02-06 DIAGNOSIS — Z9221 Personal history of antineoplastic chemotherapy: Secondary | ICD-10-CM | POA: Diagnosis not present

## 2022-02-06 DIAGNOSIS — Z171 Estrogen receptor negative status [ER-]: Secondary | ICD-10-CM | POA: Diagnosis not present

## 2022-02-06 DIAGNOSIS — E119 Type 2 diabetes mellitus without complications: Secondary | ICD-10-CM | POA: Insufficient documentation

## 2022-02-06 DIAGNOSIS — I110 Hypertensive heart disease with heart failure: Secondary | ICD-10-CM | POA: Insufficient documentation

## 2022-02-06 DIAGNOSIS — I428 Other cardiomyopathies: Secondary | ICD-10-CM | POA: Insufficient documentation

## 2022-02-06 DIAGNOSIS — I10 Essential (primary) hypertension: Secondary | ICD-10-CM | POA: Diagnosis not present

## 2022-02-06 DIAGNOSIS — Z79899 Other long term (current) drug therapy: Secondary | ICD-10-CM | POA: Insufficient documentation

## 2022-02-06 DIAGNOSIS — C50912 Malignant neoplasm of unspecified site of left female breast: Secondary | ICD-10-CM | POA: Diagnosis not present

## 2022-02-06 DIAGNOSIS — Z510 Encounter for antineoplastic radiation therapy: Secondary | ICD-10-CM | POA: Diagnosis not present

## 2022-02-06 DIAGNOSIS — I420 Dilated cardiomyopathy: Secondary | ICD-10-CM | POA: Diagnosis not present

## 2022-02-06 DIAGNOSIS — C774 Secondary and unspecified malignant neoplasm of inguinal and lower limb lymph nodes: Secondary | ICD-10-CM | POA: Insufficient documentation

## 2022-02-06 DIAGNOSIS — Z923 Personal history of irradiation: Secondary | ICD-10-CM | POA: Diagnosis not present

## 2022-02-06 LAB — RAD ONC ARIA SESSION SUMMARY
Course Elapsed Days: 9
Plan Fractions Treated to Date: 8
Plan Prescribed Dose Per Fraction: 3 Gy
Plan Total Fractions Prescribed: 10
Plan Total Prescribed Dose: 30 Gy
Reference Point Dosage Given to Date: 24 Gy
Reference Point Session Dosage Given: 3 Gy
Session Number: 8

## 2022-02-06 MED ORDER — AMLODIPINE BESYLATE 5 MG PO TABS: 5.0000 mg | ORAL_TABLET | Freq: Every day | ORAL | 3 refills | Status: DC

## 2022-02-06 NOTE — Progress Notes (Addendum)
Cardio-Oncology Clinic Consult Note   Referring Physician: Dr. Burr Medico  Primary Cardiologist: Dr, Ellyn Hack  HPI:  Kristin Ward is a 66 y.o. 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 Burr Medico for f/u in the Cardio-oncology  Found to have cardiomyopathy in 2017 in [place] (prior to chemo). EF 18% did not have a cath.   In 2018 treated for breast cancer. Had left lumpectomy, adjuvant chemo with docetaxel and cytoxan +XRT.   Prior records limited. Diagnosed with HF in 2016 while in [place]. Later relocated to the area and was followed by [facility] for a while. Subsequently followed by Dr. Ellyn Hack, last visit in 2020. Ef eventually recovered to 45-50%.   In 1/22 found to have recurrent metastatic cancer. Treated with 4 cycles of Taxol -stopped due to Neuropathy. Switched to Xeloda. Switched to [place] d/t progression. Held 02/23 d/t uncontrolled hyperglycemia.CT CAP stable right iliac wing lesion, increased inguinal lymph nodes. Biopsy right inguinal lymph node consistent with metastatic breast carcinoma. Now getting palliative XRT. Now planning to switch to Enhertu.   Echo 01/31/22 (not on Enhertu) with EF 35-40%, GLS -12.9%, RV mildly reduced  Here to discuss cardiac risk of Enhertu. Remains on Entresto, carvedilol and spiro. Having swelling in RLE due to cancer. Denies CP or SOB. Has some "squeezing" in her body. Walking regularly. BP can get quite elevated at times.      Echo 12/2015 in [place]: EF 18%, RV reduced, moderate MR Echo 05/2017: EF 45-50% Echo 08/2017: EF 45-50%, mild LVH, RV okay   Cancer Profile  Left breast invasive ductal carcinoma, triple negative    2018 Initial Diagnosis      S/p left breast lumpectomy, adjuvant chemo (docetaxel and cytoxan) and radiation    09/02/20 New right gluteal pain. Biopsy showed metastatic carcinoma, breast  primary ER 20% positive, PR and HER2 negative    02/22-05/22 4 cycles Taxol - stopped d/t peripheral neuropathy   05/22-11/22 Took Xeloda   11/22-02/23 Switched to [place] d/t progression. Held 02/23 d/t uncontrolled hyperglycemia.   04/23 CT CAP stable right iliac wing lesion, increased inguinal lymph nodes   06/23 Biopsy right inguinal lymph node consistent with metastatic breast carcinoma      Review of Systems: [y] = yes, '[ ]'  = no   General: Weight gain '[ ]' ; Weight loss '[ ]' ; Anorexia '[ ]' ; Fatigue '[ ]' ; Fever '[ ]' ; Chills '[ ]' ; Weakness '[ ]'   Cardiac: Chest pain/pressure '[ ]' ; Resting SOB '[ ]' ; Exertional SOB '[ ]' ; Orthopnea '[ ]' ; Pedal Edema Blue.Reese ]; Palpitations '[ ]' ; Syncope '[ ]' ; Presyncope '[ ]' ; Paroxysmal nocturnal dyspnea'[ ]'   Pulmonary: Cough '[ ]' ; Wheezing'[ ]' ; Hemoptysis'[ ]' ; Sputum '[ ]' ; Snoring '[ ]'   GI: Vomiting'[ ]' ; Dysphagia'[ ]' ; Melena'[ ]' ; Hematochezia '[ ]' ; Heartburn'[ ]' ; Abdominal pain '[ ]' ; Constipation '[ ]' ; Diarrhea '[ ]' ; BRBPR '[ ]'   GU: Hematuria'[ ]' ; Dysuria '[ ]' ; Nocturia'[ ]'   Vascular: Pain in legs with walking '[ ]' ; Pain in feet with lying flat '[ ]' ; Non-healing sores '[ ]' ; Stroke '[ ]' ; TIA '[ ]' ; Slurred speech '[ ]' ;  Neuro: Headaches'[ ]' ; Vertigo'[ ]' ; Seizures'[ ]' ; Paresthesias'[ ]' ;Blurred vision '[ ]' ; Diplopia '[ ]' ; Vision changes '[ ]'   Ortho/Skin: Arthritis [ y]; Joint pain Blue.Reese ]; Muscle pain '[ ]' ; Joint swelling '[ ]' ; Back Pain '[ ]' ; Rash '[ ]'   Psych: Depression'[ ]' ; Anxiety'[ ]'   Heme:  Bleeding problems '[ ]' ; Clotting disorders '[ ]' ; Anemia '[ ]'   Endocrine: Diabetes Blue.Reese ]; Thyroid dysfunction'[ ]'    Past Medical History:  Diagnosis Date   Abnormal x-ray of lungs with single pulmonary nodule 03/15/2016   per records from [place]    Anemia    Asthma    Cholelithiasis 05/19/2017   On CT   Coronary artery calcification seen on CAT scan 05/19/2017   Diabetes mellitus without complication ([place])    type 2   Diabetes mellitus, new onset ([place]) 01/20/2019   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 Diagnosed in [facility]   Headache    NONE RECENT   Hypertension    Lymph nodes enlarged 07/25/2020   Malignant neoplasm of lower-outer quadrant of left breast of female, estrogen receptor negative ([place]) 06/11/2017   left breast   Mixed hyperlipidemia 06/17/2018   partially blind    Personal history of chemotherapy 2018-2019   Personal history of radiation therapy 2019   Retinitis pigmentosa    Retroperitoneal lymphadenopathy 07/25/2020   Uveitis     Current Outpatient Medications  Medication Sig Dispense Refill   Accu-Chek FastClix Lancets MISC TEST TWICE A DAY. PT USES AN ACCU-CHEK GUIDE ME METER 102 each 2   aspirin 81 MG chewable tablet Chew 81 mg by mouth daily.     atorvastatin (LIPITOR) 20 MG tablet Take 1 tablet (20 mg total) by mouth daily. 90 tablet 3   brimonidine (ALPHAGAN) 0.2 % ophthalmic solution Place 1 drop into both eyes 2 (two) times daily.     carvedilol (COREG) 12.5 MG tablet Take 1 tablet (12.5 mg total) by mouth 2 (two) times daily with a meal. 180 tablet 1   cholecalciferol (VITAMIN D3) 25 MCG (1000 UNIT) tablet Take 2,000 Units by mouth daily.     Continuous Blood Gluc Receiver (DEXCOM G7 RECEIVER) DEVI 1 Device by Does not apply route 2 (two) times daily. 1 each 1   Continuous Blood Gluc Sensor (DEXCOM G7 SENSOR) MISC Use as directed, replace every 10 DAYS 3 each 10   furosemide (LASIX) 20 MG tablet TAKE 2 TABLETS (40 MG TOTAL) BY MOUTH AS NEEDED. FOR WEIGHT GAIN 3 TO 4 LBS. 90 tablet 3   HUMALOG KWIKPEN 100 UNIT/ML KwikPen Inject 3-10 Units into the skin 3 (three) times daily before meals. Per sliding scale     ibuprofen (ADVIL) 800 MG tablet Take 1 tablet (800 mg total) by mouth every 8 (eight) hours as needed. 30 tablet 2   insulin glargine (LANTUS) 100 UNIT/ML injection Inject 25 Units into the skin at bedtime. 15 units in am  on chemo week     Lancets Misc. (ACCU-CHEK SOFTCLIX LANCET DEV) KIT 1 Units by Does not apply route 2  (two) times daily. Check FSBS BID - Include strips # 50 with 5 refills, Lancets #50 with 5 refills DX: Type 2 DM - ICD 10: E11.9 1 kit 0   lidocaine-prilocaine (EMLA) cream Apply 1 application topically as needed. 30 g 0   linaclotide (LINZESS) 72 MCG capsule Take 72 mcg by mouth as needed.     metFORMIN (GLUCOPHAGE) 1000 MG tablet Take 1 tablet (1,000 mg total) by mouth 2 (two) times daily with a meal. 180 tablet 3   Multiple Vitamins-Minerals (WOMENS MULTI PO) Take 15 mLs by mouth daily.     ondansetron (ZOFRAN) 8 MG tablet Take 1 tablet (8 mg total) by mouth every 8 (eight) hours as needed for nausea or  vomiting (begin on day 3 after chemo). 30 tablet 1   oxyCODONE (OXY IR/ROXICODONE) 5 MG immediate release tablet Take 1 tablet (5 mg total) by mouth every 8 (eight) hours as needed for severe pain. 60 tablet 0   oxyCODONE-acetaminophen (PERCOCET/ROXICET) 5-325 MG tablet Take 1 tablet by mouth every 6 (six) hours as needed for severe pain.     pantoprazole (PROTONIX) 40 MG tablet TAKE 1 TABLET BY MOUTH EVERY DAY 90 tablet 1   prochlorperazine (COMPAZINE) 10 MG tablet Take 1 tablet (10 mg total) by mouth every 6 (six) hours as needed (Nausea or vomiting). 30 tablet 1   sacubitril-valsartan (ENTRESTO) 97-103 MG Take 1 tablet by mouth 2 (two) times daily. 180 tablet 3   Semaglutide (RYBELSUS) 7 MG TABS Take 7 mg by mouth daily. 90 tablet 3   vitamin B-12 (CYANOCOBALAMIN) 1000 MCG tablet Take 1,000 mcg by mouth daily.     vitamin C (ASCORBIC ACID) 500 MG tablet Take 500 mg by mouth in the morning, at noon, in the evening, and at bedtime.     zinc gluconate 50 MG tablet Take 50 mg by mouth daily.     spironolactone (ALDACTONE) 25 MG tablet Take 25 mg by mouth daily. (Patient not taking: Reported on 02/06/2022)     Current Facility-Administered Medications  Medication Dose Route Frequency Provider Last Rate Last Admin   insulin glulisine (APIDRA) injection 6 Units  6 Units Subcutaneous Once Williams,  Lynne B, PA-C        Allergies  Allergen Reactions   Morphine And Related Nausea And Vomiting   Latex Hives and Itching    Burning    Tylenol With Codeine #3 [Acetaminophen-Codeine] Nausea And Vomiting   Penicillins Nausea Only, Swelling and Rash    Has patient had a PCN reaction causing immediate rash, facial/tongue/throat swelling, SOB or lightheadedness with hypotension: Yes Has patient had a PCN reaction causing severe rash involving mucus membranes or skin necrosis: Yes Has patient had a PCN reaction that required hospitalization: No Has patient had a PCN reaction occurring within the last 10 years: No TONGUE SWELLING AND RASH AROUND MOUTH If all of the above answers are "NO", then may proceed with Cephalosporin use.       Social History   Socioeconomic History   Marital status: Widowed    Spouse name: Not on file   Number of children: 2   Years of education: Not on file   Highest education level: Not on file  Occupational History   Not on file  Tobacco Use   Smoking status: Former    Packs/day: 0.25    Years: 23.00    Total pack years: 5.75    Types: Cigarettes    Quit date: 10/09/2015    Years since quitting: 6.3   Smokeless tobacco: Never  Vaping Use   Vaping Use: Never used  Substance and Sexual Activity   Alcohol use: No   Drug use: No   Sexual activity: Never  Other Topics Concern   Not on file  Social History Narrative   She is a widowed mother of 2 (daughter and son).   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   She moved to [place] apparently back in 2016, but was visiting family in [place] and was diagnosed with Cardiomyopathy-EF 18%.   She is usually accompanied by her daughter.    At baseline, she walks roughly 1-2 miles a day.   Social Determinants of Health   Financial  Resource Strain: Low Risk  (11/01/2021)   Overall Financial Resource Strain (CARDIA)    Difficulty of Paying Living Expenses: Not hard at all  Food Insecurity: No Food  Insecurity (09/01/2020)   Hunger Vital Sign    Worried About Running Out of Food in the Last Year: Never true    Ran Out of Food in the Last Year: Never true  Transportation Needs: No Transportation Needs (11/01/2021)   PRAPARE - Hydrologist (Medical): No    Lack of Transportation (Non-Medical): No  Physical Activity: Not on file  Stress: Stress Concern Present (09/01/2020)   [place]    Feeling of Stress : Rather much  Social Connections: Unknown (09/01/2020)   Social Connection and Isolation Panel [NHANES]    Frequency of Communication with Friends and Family: More than three times a week    Frequency of Social Gatherings with Friends and Family: More than three times a week    Attends Religious Services: More than 4 times per year    Active Member of [employer] or Organizations: No    Attends Music therapist: Never    Marital Status: Not on file  Intimate Partner Violence: Not on file      Family History  Problem Relation Age of Onset   Colon cancer Mother 43       deceased 30   Heart attack Father    Breast cancer Other 7       2nd cousin on maternal side; currently 90   Pancreatic cancer Other        2nd cousin once removed on maternal side; deceased 21s   Fibromyalgia Sister    Esophageal cancer Neg Hx    Rectal cancer Neg Hx    Stomach cancer Neg Hx     Vitals:   02/06/22 1341  BP: (!) 180/100  Pulse: 79  SpO2: 98%  Weight: 101.3 kg (223 lb 6.4 oz)     PHYSICAL EXAM: General:  Well appearing. No respiratory difficulty HEENT: normal Neck: supple. no JVD. Carotids 2+ bilat; no bruits. No lymphadenopathy or thyromegaly appreciated. Cor: PMI nondisplaced. Regular rate & rhythm. No rubs, gallops or murmurs. Lungs: clear Abdomen: soft, nontender, nondistended. No hepatosplenomegaly. No bruits or masses. Good bowel sounds. Extremities: no cyanosis, clubbing,  rash, 2+RLE up to hip edema Neuro: alert & oriented x 3, cranial nerves grossly intact. moves all 4 extremities w/o difficulty. Affect pleasant.   ASSESSMENT & PLAN:  1. Metastatic left breast cancer - agree with plan for Enhertu - discussed small risk of reversible cardiotoxicty with Enhertu but benefit seems to clearly outweigh the risks - Will plan to see back in 2 months with repeat echo    2. Chronic systolic CHF/?NICM: -Diagnosed in 2016. Unclear etiology -Echo 12/2015 in [place]: EF 18%, RV reduced, moderate MR -Echo 05/2017: EF 45-50% -Echo 08/2017: EF 45-50%, mild LVH, RV okay -Echo 01/31/22 (not on Enhertu) with EF 35-40%, GLS -12.9%, RV mildly reduced -No prior cMRI or cardiac cath available for review -NYHA I-II. Volume status ok. Take lasix as needed - Check cardiac MRI - Continue carvedilol, spiro and Entresto. Consider SGLT2i soon  3. RLE edema - suspect lymphedema from CA - compression stocking - u/s to r/o DVT  4. HTN - poorly controlled - add norvasc 5   Glori Bickers, MD  3:00 PM

## 2022-02-06 NOTE — Patient Instructions (Signed)
Medication Changes:  START Amlodipine   Lab Work:  None  Testing/Procedures:  Your physician has requested that you have a lower extremity venous duplex. This test is an ultrasound of the veins in the legs or arms. It looks at venous blood flow that carries blood from the heart to the legs or arms. Allow one hour for a Lower Venous exam. Allow thirty minutes for an Upper Venous exam. There are no restrictions or special instructions.  Your physician has requested that you have a cardiac MRI. Cardiac MRI uses a computer to create images of your heart as its beating, producing both still and moving pictures of your heart and major blood vessels. For further information please visit [email or web address] Please follow the instruction sheet given to you today for more information. ONCE APPROVED BY INSURANCE THEY WILL CALL YOU TO SCHEDULE  Your physician has requested that you have an echocardiogram. Echocardiography is a painless test that uses sound waves to create images of your heart. It provides your doctor with information about the size and shape of your heart and how well your heart's chambers and valves are working. This procedure takes approximately one hour. There are no restrictions for this procedure. IN 2 MONTHS  Referrals:  None  Special Instructions // Education:  Do the following things EVERYDAY: Weigh yourself in the morning before breakfast. Write it down and keep it in a log. Take your medicines as prescribed Eat low salt foods--Limit salt (sodium) to 2000 mg per day.  Stay as active as you can everyday Limit all fluids for the day to less than 2 liters  Please wear your compression hose daily, place them on as soon as you get up in the morning and remove before you go to bed at night.   Follow-Up in: 2 months with an echocardiogram  At the [facility], you and your health needs are our priority. We have a designated team specialized in the treatment  of Heart Failure. This Care Team includes your primary Heart Failure Specialized Cardiologist (physician), Advanced Practice Providers (APPs- Physician Assistants and Nurse Practitioners), and Pharmacist who all work together to provide you with the care you need, when you need it.   You may see any of the following providers on your designated Care Team at your next follow up:  Dr Glori Bickers Dr Haynes Kerns, NP Lyda Jester, [place] [facility] [place], [place] Audry Riles, PharmD   Please be sure to bring in all your medications bottles to every appointment.   Need to Contact us:  If you have any questions or concerns before your next appointment please send [place] a message through [place] or call our office at [phone].    TO LEAVE A MESSAGE FOR THE NURSE SELECT OPTION 2, PLEASE LEAVE A MESSAGE INCLUDING: YOUR NAME DATE OF BIRTH CALL BACK NUMBER REASON FOR CALL**this is important as we prioritize the call backs  YOU WILL RECEIVE A CALL BACK THE SAME DAY AS LONG AS YOU CALL BEFORE 4:00 PM

## 2022-02-06 NOTE — Progress Notes (Signed)
Right lower extremity venous duplex completed. Refer to "CV Proc" under chart review to view preliminary results.  02/06/2022 3:38 PM Kelby Aline., MHA, RVT, RDCS, RDMS

## 2022-02-07 ENCOUNTER — Other Ambulatory Visit: Payer: Self-pay

## 2022-02-07 ENCOUNTER — Ambulatory Visit
Admission: RE | Admit: 2022-02-07 | Discharge: 2022-02-07 | Disposition: A | Discharge status: Home / Self Care | Payer: Medicare Other | Source: Ambulatory Visit | Attending: Radiation Oncology | Admitting: Radiation Oncology

## 2022-02-07 DIAGNOSIS — C50512 Malignant neoplasm of lower-outer quadrant of left female breast: Secondary | ICD-10-CM | POA: Diagnosis not present

## 2022-02-07 DIAGNOSIS — C50912 Malignant neoplasm of unspecified site of left female breast: Secondary | ICD-10-CM | POA: Diagnosis not present

## 2022-02-07 DIAGNOSIS — Z510 Encounter for antineoplastic radiation therapy: Secondary | ICD-10-CM | POA: Diagnosis not present

## 2022-02-07 DIAGNOSIS — C774 Secondary and unspecified malignant neoplasm of inguinal and lower limb lymph nodes: Secondary | ICD-10-CM | POA: Diagnosis not present

## 2022-02-07 LAB — RAD ONC ARIA SESSION SUMMARY
Course Elapsed Days: 10
Plan Fractions Treated to Date: 9
Plan Prescribed Dose Per Fraction: 3 Gy
Plan Total Fractions Prescribed: 10
Plan Total Prescribed Dose: 30 Gy
Reference Point Dosage Given to Date: 27 Gy
Reference Point Session Dosage Given: 3 Gy
Session Number: 9

## 2022-02-07 NOTE — Progress Notes (Signed)
Pharmacist Chemotherapy Monitoring - Initial Assessment    Anticipated start date: 02/15/22   The following has been reviewed per standard work regarding the patient's treatment regimen: The patient's diagnosis, treatment plan and drug doses, and organ/hematologic function Lab orders and baseline tests specific to treatment regimen  The treatment plan start date, drug sequencing, and pre-medications Prior authorization status  Patient's documented medication list, including drug-drug interaction screen and prescriptions for anti-emetics and supportive care specific to the treatment regimen The drug concentrations, fluid compatibility, administration routes, and timing of the medications to be used The patient's access for treatment and lifetime cumulative dose history, if applicable  The patient's medication allergies and previous infusion related reactions, if applicable   Changes made to treatment plan:  treatment plan date  Follow up needed:  ECHO on 01/31/22: EF = 35-40%.  Pt is seeing Dr. Burr Medico 02/08/22.  Will f/u plan for tx, if changes made.   Kennith Center, Pharm.D., CPP 02/07/2022'@11'$ :25 AM

## 2022-02-08 ENCOUNTER — Other Ambulatory Visit: Payer: Self-pay

## 2022-02-08 ENCOUNTER — Ambulatory Visit
Admission: RE | Admit: 2022-02-08 | Discharge: 2022-02-08 | Disposition: A | Discharge status: Home / Self Care | Payer: Medicare Other | Source: Ambulatory Visit | Attending: Radiation Oncology | Admitting: Radiation Oncology

## 2022-02-08 ENCOUNTER — Encounter: Payer: Self-pay | Admitting: Hematology

## 2022-02-08 ENCOUNTER — Inpatient Hospital Stay (HOSPITAL_BASED_OUTPATIENT_CLINIC_OR_DEPARTMENT_OTHER): Payer: Medicare Other | Admitting: Hematology

## 2022-02-08 ENCOUNTER — Encounter: Payer: Self-pay | Admitting: Internal Medicine

## 2022-02-08 ENCOUNTER — Ambulatory Visit: Payer: Medicare Other

## 2022-02-08 ENCOUNTER — Encounter: Payer: Self-pay | Admitting: Radiation Oncology

## 2022-02-08 ENCOUNTER — Inpatient Hospital Stay: Payer: Medicare Other

## 2022-02-08 VITALS — BP 151/97 | HR 76 | Temp 98.2°F | Resp 15 | Wt 224.6 lb

## 2022-02-08 DIAGNOSIS — C50512 Malignant neoplasm of lower-outer quadrant of left female breast: Secondary | ICD-10-CM

## 2022-02-08 DIAGNOSIS — Z171 Estrogen receptor negative status [ER-]: Secondary | ICD-10-CM

## 2022-02-08 DIAGNOSIS — Z510 Encounter for antineoplastic radiation therapy: Secondary | ICD-10-CM | POA: Diagnosis not present

## 2022-02-08 DIAGNOSIS — C774 Secondary and unspecified malignant neoplasm of inguinal and lower limb lymph nodes: Secondary | ICD-10-CM | POA: Diagnosis not present

## 2022-02-08 DIAGNOSIS — Z95828 Presence of other vascular implants and grafts: Secondary | ICD-10-CM

## 2022-02-08 DIAGNOSIS — C50912 Malignant neoplasm of unspecified site of left female breast: Secondary | ICD-10-CM | POA: Diagnosis not present

## 2022-02-08 LAB — RAD ONC ARIA SESSION SUMMARY
Course Elapsed Days: 11
Plan Fractions Treated to Date: 10
Plan Prescribed Dose Per Fraction: 3 Gy
Plan Total Fractions Prescribed: 10
Plan Total Prescribed Dose: 30 Gy
Reference Point Dosage Given to Date: 30 Gy
Reference Point Session Dosage Given: 3 Gy
Session Number: 10

## 2022-02-08 LAB — CBC WITH DIFFERENTIAL/PLATELET
Abs Immature Granulocytes: 0.02 10*3/uL (ref 0.00–0.07)
Basophils Absolute: 0 10*3/uL (ref 0.0–0.1)
Basophils Relative: 0 %
Eosinophils Absolute: 0.3 10*3/uL (ref 0.0–0.5)
Eosinophils Relative: 6 %
HCT: 31.2 % — ABNORMAL LOW (ref 36.0–46.0)
Hemoglobin: 10.1 g/dL — ABNORMAL LOW (ref 12.0–15.0)
Immature Granulocytes: 0 %
Lymphocytes Relative: 15 %
Lymphs Abs: 0.7 10*3/uL (ref 0.7–4.0)
MCH: 29.2 pg (ref 26.0–34.0)
MCHC: 32.4 g/dL (ref 30.0–36.0)
MCV: 90.2 fL (ref 80.0–100.0)
Monocytes Absolute: 0.4 10*3/uL (ref 0.1–1.0)
Monocytes Relative: 10 %
Neutro Abs: 3.1 10*3/uL (ref 1.7–7.7)
Neutrophils Relative %: 69 %
Platelets: 262 10*3/uL (ref 150–400)
RBC: 3.46 MIL/uL — ABNORMAL LOW (ref 3.87–5.11)
RDW: 14 % (ref 11.5–15.5)
WBC: 4.5 10*3/uL (ref 4.0–10.5)
nRBC: 0 % (ref 0.0–0.2)

## 2022-02-08 LAB — COMPREHENSIVE METABOLIC PANEL
ALT: 7 U/L (ref 0–44)
AST: 12 U/L — ABNORMAL LOW (ref 15–41)
Albumin: 4 g/dL (ref 3.5–5.0)
Alkaline Phosphatase: 82 U/L (ref 38–126)
Anion gap: 5 (ref 5–15)
BUN: 13 mg/dL (ref 8–23)
CO2: 26 mmol/L (ref 22–32)
Calcium: 9.5 mg/dL (ref 8.9–10.3)
Chloride: 110 mmol/L (ref 98–111)
Creatinine, Ser: 0.7 mg/dL (ref 0.44–1.00)
GFR, Estimated: >60 mL/min (ref >60)
Glucose, Bld: 114 mg/dL — ABNORMAL HIGH (ref 70–99)
Potassium: 4.2 mmol/L (ref 3.5–5.1)
Sodium: 141 mmol/L (ref 135–145)
Total Bilirubin: 0.4 mg/dL (ref 0.3–1.2)
Total Protein: 7.9 g/dL (ref 6.5–8.1)

## 2022-02-08 MED ORDER — HEPARIN SOD (PORK) LOCK FLUSH 100 UNIT/ML IV SOLN
500.0000 [IU] | Freq: Once | INTRAVENOUS | Status: AC | PRN
  Administered 2022-02-08: 500 [IU] via INTRAVENOUS

## 2022-02-08 MED ORDER — SODIUM CHLORIDE 0.9% FLUSH
10.0000 mL | INTRAVENOUS | Status: DC | PRN
  Administered 2022-02-08: 10 mL via INTRAVENOUS

## 2022-02-08 NOTE — Progress Notes (Addendum)
[place]   [phone] [phone]   Clinic Follow up Note   Patient Care Team: [name] as PCP - General (Physician Assistant) [name], MD as PCP - Cardiology (Cardiology) [name], MD as Consulting Physician (Hematology) [name], MD as Consulting Physician (General Surgery) [name], [facility] as Pharmacist (Pharmacist) Jacelyn Pi, MD as Referring Physician (Endocrinology)  Date of Service:  02/08/2022  CHIEF COMPLAINT: f/u of metastatic breast cancer  CURRENT THERAPY:  -Palliative radiation, 01/28/22 - 02/18/22 -to start Enhertu 02/15/22  ASSESSMENT & PLAN:  Kristin Ward is a 66 y.o. female with   1. Left breast invasive ductal carcinoma, stage IB, p(T1cN0M0), Triple negative, Grade 3, in 2018, metastatic disease in 08/2020 ER 20% weakly + PR/HER2 (0) negative, PD-L1 0%, genetics (-), HER2 1+ on node biopsy  -diagnosed with triple negative breast cancer in 05/2017. She is s/p left breast lumpectomy, adjuvant chemo TC and Radiation.  -Genetic testing was negative for pathogenetic mutations.  -Due to new right gluteal pain, she underwent biopsy on 09/02/20 which showed metastatic carcinoma, consistent with breast primary. ER 20% positive, PR and HER2 negative.  -she received 4 cycles of Taxol 09/26/20 - 12/18/20, discontinued due to worsening neuropathy. She then took Xeloda from 01/01/21 - 06/2021. -she switched to [facility] 07/03/21 - 01/11/22 due to disease progression. Dose reduced due to nausea and diarrhea. Chemo held for one dose in 09/2021 due to uncontrolled hyperglycemia.  -restaging CT CAP on 11/27/21 showed: stable right iliac wing lesion; increase in volume of inguinal lymph nodes, overall similar from prior and improved from 06/2021. -her CA 27.29 initially responded well to treatment but has been rising lately, up to 62.1 on 01/11/22. -biopsy of a right inguinal lymph node on 01/15/22 showed  adenocarcinoma, consistent with metastatic breast carcinoma. Prognostic panel confirmed triple negative disease (Her2 1+ low expression).  -she is receiving palliative radiation under Dr. Lisbeth Renshaw 01/28/22 - 02/18/22.  -I recommend her to switch therapy to Enhertu, starting next week 02/15/22. I again discussed possible effects of this medicine, especially risk of reversible cardiomyopathy and interstitial lung disease.  She has baseline cardiomyopathy with EF 35 to 40% on recent echo, she was seen by cardiologist Dr. Haroldine Laws earlier this week on and he will monitor echo closely.  If she does have dropping EF on next echo, then I have to stop.  We also discussed up to 10% risk of additional lung disease, and signs to watch.  I think the benefit is outweighed the risk of medicine, and recommend her to try.  She voiced good understanding, and agrees to try.  We also discussed as a chemo options, such as [place], however the systemic treatment option is limited since she has had multiple lines of therapy. -I plan to obtain a repeat CT for a new baseline; I ordered today.  2. Chronic combined systolic and diastolic heart failure -due to concerns for reversible cardiotoxicity with Enhertu, she was evaluated by Dr. Haroldine Laws. Echo on 01/31/22 showed EF 35-40%, GLS -12.9%, RV mildly reduced.  -will monitor with repeat echo 04/01/22   3. Diabetes and uncontrolled hyperglycemia -worsening hyperglycemia lately, probably related to dexa (before chemo) and Trodelvy  -her hyperglycemia is now better controlled on 25u Lantus and sliding scale insulin.   4. Bone metastases, Vit D deficiency -She had radiating right hip pain since 2020 from right iliac wing mass -She completed Palliative radiation to right iliac 09/04/20 - 09/22/20 with Dr.  Moody, and her pain is much improved.  -Given localized bone met, she started Zometa q29months on 11/20/20. Most recent dose 11/08/21 -vit D has remained low, most recently 20.86 on  08/17/21.   5. G2 peripheral Neuropathy  -S/p C2D8 Taxol she developed mild tingling in her hands and feet. Taxol stopped after 12/18/20. Her neuropathy is improving.  -Overall stable   6. DM, HTN, COPD, retinitis pigmentosa with limited vision -Continue to follow-up with PCP Dr Susann Givens and cardiologist Dr Herbie Baltimore  -she is legally blind   7. Goal of care discussion  -The patient understands the goal of care is palliative. -she is full code now      Plan -continue daily radiation -CT CAP to be done in next few weeks as a new baseline  -lab, flush, f/u, and Enhertu on 7/7 as scheduled and on 7/28, OK to treat with her low EF  -lab, flush, and f/u with PA Cassie on 7/14 for toxicity check -f/u with me in 4 weeks with second cycle Enhertu    No problem-specific Assessment & Plan notes found for this encounter.   SUMMARY OF ONCOLOGIC HISTORY: Oncology History Overview Note  Cancer Staging Malignant neoplasm of lower-outer quadrant of left breast of female, estrogen receptor negative (HCC) Staging form: Breast, AJCC 8th Edition - Clinical stage from 06/06/2017: Stage IB (cT1c, cN0, cM0, G3, ER-, PR-, HER2-) - Signed by [name], MD on 06/15/2017 Nuclear grade: G3 Histologic grading system: 3 grade system - Pathologic stage from 06/26/2017: Stage IB (pT1c, pN0, cM0, G3, ER-, PR-, HER2-) - Signed by [name], MD on 07/10/2017 Neoadjuvant therapy: No Nuclear grade: G3 Multigene prognostic tests performed: None Histologic grading system: 3 grade system Laterality: Left     Malignant neoplasm of lower-outer quadrant of left breast of female, estrogen receptor negative (HCC)  05/30/2017 Mammogram   Diagnostic mammo and US IMPRESSION: 1. Highly suspicious 1.4 cm mass in the slightly lower slightly outer left breast -tissue sampling recommended. 2. Indeterminate 0.5 mm mass in the slightly lower slightly outer left breast -tissue sampling recommended. 3. At least 2 left axillary  lymph nodes with borderline cortical thickness.   06/06/2017 Initial Biopsy   Diagnosis 1. Breast, left, needle core biopsy, 5:30 o'clock - INVASIVE DUCTAL CARCINOMA, G3 2. Lymph node, needle/core biopsy, left axillary - NO CARCINOMA IDENTIFIED IN ONE LYMPH NODE (0/1)   06/06/2017 Initial Diagnosis   Malignant neoplasm of lower-outer quadrant of left breast of female, estrogen receptor negative (HCC)   06/06/2017 Receptors her2   Estrogen Receptor: 0%, NEGATIVE Progesterone Receptor: 0%, NEGATIVE Proliferation Marker Ki67: 70%   06/26/2017 Surgery   LEFT BREAST LUMPECTOMY WITH RADIOACTIVE SEED AND SENTINEL LYMPH NODE BIOPSY ERAS  PATHWAY AND INSERTION PORT-A-CATH By Dr. Donell Beers on 06/26/17    06/26/2017 Pathology Results   Diagnosis 06/26/17  1. Breast, lumpectomy, Left - INVASIVE DUCTAL CARCINOMA, GRADE III/III, SPANNING 1.2 CM. - THE SURGICAL RESECTION MARGINS ARE NEGATIVE FOR CARCINOMA. - SEE ONCOLOGY TABLE BELOW. 2. Lymph node, sentinel, biopsy, Left axillary #1 - THERE IS NO EVIDENCE OF CARCINOMA IN 1 OF 1 LYMPH NODE (0/1). 3. Lymph node, sentinel, biopsy, Left axillary #2 - THERE IS NO EVIDENCE OF CARCINOMA IN 1 OF 1 LYMPH NODE (0/1). 4. Lymph node, sentinel, biopsy, Left axillary #3 - THERE IS NO EVIDENCE OF CARCINOMA IN 1 OF 1 LYMPH NODE (0/1).    07/25/2017 - 09/29/2017 Chemotherapy   Adjuvant cytoxan and docetaxel (TC) every 3 weekd for 4 cycles  11/19/2017 - 12/17/2017 Radiation Therapy   Adjuvant breast radiation Left breast treated to 42.5 Gy with 17 fx of 2.5 Gy followed by a boost of 7.5 Gy with 3 fx of 2.5 Gy      02/2019 Procedure   She had PAC removal in 02/2019.    07/23/2020 Imaging   MRI Lumbar Spine 07/23/20 IMPRESSION: 1. No significant disc herniation, spinal canal or neural foraminal stenosis at any level. 2. Moderate facet degenerative changes at L3-4, L4-5 and L5-S1. 3. Multiple enlarged retroperitoneal and right iliac lymph  nodes. Recommend correlation with CT of the abdomen and pelvis with contrast.   08/15/2020 Imaging   CT AP 08/15/20  IMPRESSION: 1. Right iliac and periaortic adenopathy is noted concerning for metastatic disease or lymphoma. Also noted is abnormal soft tissue mass anterior and posterior to the right iliac wing concerning for malignancy or metastatic disease. MRI is recommended for further evaluation. 2. Irregular lucency with sclerotic margins is seen involving the right iliac crest concerning for possible lytic lesion. MRI may be performed for further evaluation. 3. Enlarged fibroid uterus. 4. Small gallstone. 5. Aortic atherosclerosis.   08/22/2020 Pathology Results   FINAL MICROSCOPIC DIAGNOSIS: 08/22/20  A. NEEDLE CORE, RIGHT GLUTEUS MINIMUMUS, BIOPSY:  -  Metastatic carcinoma  -  See comment   COMMENT:  By immunohistochemistry, the neoplastic cells are positive for  cytokeratin-7 and have weak patchy positivity for GATA3 but are negative for cytokeratin-20 and GCDFP.  The combined morphology and immunophenotype, support metastasis from the patient's known breast primary carcinoma.   ADDENDUM:  PROGNOSTIC INDICATOR RESULTS:  The tumor cells are NEGATIVE for Her2 (0).  Estrogen Receptor:       POSITIVE, 20%, WEAK STAINING  Progesterone Receptor:   NEGATIVE   PD-L1 (0) negative    09/04/2020 - 09/22/2020 Radiation Therapy   Palliative radiation to right iliac 09/04/20 - 09/22/20, Dr. Lisbeth Renshaw   09/26/2020 -  Chemotherapy   first line weekly Taxol 3 weeks on/1 week off starting 09/26/20 --Reduced to 2 weeks on/1 week off starting with C3 due to neuropathy. Stopped after C4 on 12/18/20 due to neuratrophy.  --Switched to Xeloda on 01/01/21 at $RemoveBe'1500mg'ptjlVpqjX$  in the AM and $Remo'2000mg'iaLNx$  in the PM for 2 weeks on/1 week off     10/13/2020 Procedure    PAC placement on 10/13/20.    04/10/2021 Imaging   CT CAP  IMPRESSION: 1. Enlarging abdominopelvic retroperitoneal and right inguinal adenopathy,  compatible with disease progression. 2. Lytic metastasis in the right iliac wing, similar. 3. Hepatomegaly. 4. Cholelithiasis. 5. Enlarged fibroid uterus. 6.  Aortic atherosclerosis (ICD10-I70.0).   06/18/2021 Imaging   CT CAP  IMPRESSION: 1. Today's study demonstrates progression of metastatic disease as evidence by increased number and size of numerous enlarged lymph nodes in the retroperitoneum, along the right pelvic sidewall, and in the upper right thigh. 2. Osseous metastases in the bony pelvis appear similar to the prior examination. No definite new osseous lesions are otherwise noted. 3. Fibroid uterus again noted. 4. Cholelithiasis without evidence of acute cholecystitis. 5. Aortic atherosclerosis, in addition to left anterior descending coronary artery disease. Please note that although the presence of coronary artery calcium documents the presence of coronary artery disease, the severity of this disease and any potential stenosis cannot be assessed on this non-gated CT examination. Assessment for potential risk factor modification, dietary therapy or pharmacologic therapy may be warranted, if clinically indicated. 6. Additional incidental findings, as above.   06/20/2021 Imaging   Bone  Scan  IMPRESSION: No osseous uptake suspicious for recurrent/progressive metastatic disease.   07/03/2021 - 01/11/2022 Chemotherapy   Patient is on Treatment Plan : BREAST METASTATIC Sacituzumab govitecan-hziy [name]) q21d     09/27/2021 Imaging   EXAM: CT CHEST, ABDOMEN, AND PELVIS WITH CONTRAST  IMPRESSION: 1. Marked interval improvement in previously demonstrated lymphadenopathy in the abdomen and pelvis. A few prominent right inguinal lymph nodes remain. 2. Stable osseous metastatic disease in the pelvis. 3. No evidence of local recurrence or other metastatic disease. 4. Stable incidental findings including cholelithiasis, uterine fibroids and Aortic Atherosclerosis  (ICD10-I70.0).  ADDENDUM: Not mentioned in the original impression is a possible nonocclusive fibrin sheath along the distal aspect of the Port-A-Cath.   09/27/2021 Imaging   EXAM: NUCLEAR MEDICINE WHOLE BODY BONE SCAN  IMPRESSION: Stable examination, without abnormal osseous uptake.   11/27/2021 Imaging   CT AP IMPRESSION: 1. Stable lytic and sclerotic lesion in the RIGHT iliac wing. 2. No evidence of visceral metastasis or adenopathy in the abdomenpelvis. 3. Leiomyomatous uterus.  ADDENDUM REPORT: 11/29/2021 09:33 Upon further review and discussion with Cira Rue NP, the RIGHT inguinal lymph nodes previous described have increased in volume. For example 14 mm node (image 91/2) is increased from 9 mm. Adjacent 14 mm node on image 86 is increased from 8 mm. Additionally, LEFT common iliac lymph node is also slightly increased measuring 10 mm (46/2) compared to 7 mm on comparison exam from 09/27/2021. These sites demonstrates similar but greater lymphadenopathy on CT 06/18/2021.   02/15/2022 -  Chemotherapy   Patient is on Treatment Plan : BREAST METASTATIC fam-trastuzumab deruxtecan-nxki (Enhertu) q21d        INTERVAL HISTORY:  Kristin Ward is here for a follow up of metastatic breast cancer. She was last seen by me on 01/24/22. She presents to the clinic alone. She reports she is doing well overall. She denies any change in her lymph nodes at this point. She tells me her camping trip was very enjoyable.   All other systems were reviewed with the patient and are negative.  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 Diagnosed in [facility]   Headache    NONE RECENT   Hypertension    Lymph nodes enlarged 07/25/2020   Malignant neoplasm of lower-outer quadrant of left breast of female, estrogen receptor negative ([place]) 06/11/2017   left breast   Mixed hyperlipidemia 06/17/2018   partially blind    Personal history of chemotherapy 2018-2019   Personal history of radiation therapy 2019   Retinitis pigmentosa    Retroperitoneal lymphadenopathy 07/25/2020   Uveitis     SURGICAL HISTORY: Past Surgical History:  Procedure Laterality Date   brain cyst removed  2007   to help with headaches per patient , aspirated    BREAST BIOPSY Left 06/06/2017   x2   BREAST CYST ASPIRATION Left 06/06/2017   BREAST LUMPECTOMY Left 06/26/2017   BREAST LUMPECTOMY WITH RADIOACTIVE SEED AND SENTINEL LYMPH NODE BIOPSY Left 06/26/2017   Procedure: BREAST LUMPECTOMY WITH RADIOACTIVE SEED AND SENTINEL  LYMPH NODE BIOPSY ERAS  PATHWAY;  Surgeon: [name], MD;  Location: [place];  Service: General;  Laterality: Left;  pec block   FINE NEEDLE ASPIRATION Left 02/23/2019   Procedure: FINE NEEDLE ASPIRATION;  Surgeon: [name], MD;  Location: [place];  Service: General;  Laterality: Left;  Asiration of left breast seroma    IR IMAGING GUIDED PORT INSERTION  10/13/2020   NASAL TURBINATE REDUCTION     PORT-A-CATH REMOVAL Right 02/23/2019   Procedure: REMOVAL PORT-A-CATH;  Surgeon: [name], MD;  Location: [place];  Service: General;  Laterality: Right;   PORTACATH PLACEMENT N/A 06/26/2017   Procedure: INSERTION PORT-A-CATH;  Surgeon: [name], MD;  Location: [place];  Service: General;  Laterality: N/A;   TRANSTHORACIC ECHOCARDIOGRAM  12/2015   A) [facility] May 2017: Mild concentric LVH. Global hypokinesis. GR daily. EF 18% severe LA dilation. Mitral annular dilatation with papillary muscle dysfunction and moderate  MR. Dilated IVC consistent with elevated RAP.   TRANSTHORACIC ECHOCARDIOGRAM  05/2017; 08/2017   a) Mild concentric LVH.  EF 45 to 50% with diffuse hypokinesis.  GR 1 DD.  Mild aortic root dilation.;; b)  Mild LVH EF 45 to 50%.  Diffuse HK.  No significant valve disease.  No significant change.    I have reviewed the social history and family history with the patient and they are unchanged from previous note.  ALLERGIES:  is allergic to morphine and related, latex, tylenol with codeine #3 [acetaminophen-codeine], and penicillins.  MEDICATIONS:  Current Outpatient Medications  Medication Sig Dispense Refill   Accu-Chek FastClix Lancets MISC TEST TWICE A DAY. PT USES AN ACCU-CHEK GUIDE ME METER 102 each 2   amLODipine (NORVASC) 5 MG tablet Take 1 tablet (5 mg total) by mouth daily. 30 tablet 3   aspirin 81 MG chewable tablet Chew 81 mg by mouth daily.     atorvastatin (LIPITOR) 20 MG tablet Take 1 tablet (20 mg total) by mouth daily. 90 tablet 3   brimonidine (ALPHAGAN) 0.2 % ophthalmic solution Place 1 drop into both eyes 2 (two) times daily.     carvedilol (COREG) 12.5 MG tablet Take 1 tablet (12.5 mg total) by mouth 2 (two) times daily with a meal. 180 tablet 1   cholecalciferol (VITAMIN D3) 25 MCG (1000 UNIT) tablet Take 2,000 Units by mouth daily.     Continuous Blood Gluc Receiver (DEXCOM G7 RECEIVER) DEVI 1 Device by Does not apply route 2 (two) times daily. 1 each 1   Continuous Blood Gluc Sensor (DEXCOM G7 SENSOR) MISC Use as directed, replace every 10 DAYS 3 each 10   furosemide (LASIX) 20 MG tablet TAKE 2 TABLETS (40 MG TOTAL) BY MOUTH AS NEEDED. FOR WEIGHT GAIN 3 TO 4 LBS. 90 tablet 3   HUMALOG KWIKPEN 100 UNIT/ML KwikPen Inject 3-10 Units into the skin 3 (three) times daily before meals. Per sliding scale     ibuprofen (ADVIL) 800 MG tablet Take 1 tablet (800 mg total) by mouth every 8 (eight) hours as needed. 30 tablet 2   insulin glargine (LANTUS) 100 UNIT/ML injection Inject 25  Units into the skin at bedtime. 15 units in am  on chemo week     Lancets Misc. (ACCU-CHEK SOFTCLIX LANCET DEV) KIT 1 Units by Does not apply route 2 (two) times daily. Check FSBS BID - Include strips # 50 with 5 refills, Lancets #50 with 5 refills DX: Type 2 DM - ICD 10: E11.9 1  kit 0   lidocaine-prilocaine (EMLA) cream Apply 1 application topically as needed. 30 g 0   linaclotide (LINZESS) 72 MCG capsule Take 72 mcg by mouth as needed.     metFORMIN (GLUCOPHAGE) 1000 MG tablet Take 1 tablet (1,000 mg total) by mouth 2 (two) times daily with a meal. 180 tablet 3   Multiple Vitamins-Minerals (WOMENS MULTI PO) Take 15 mLs by mouth daily.     ondansetron (ZOFRAN) 8 MG tablet Take 1 tablet (8 mg total) by mouth every 8 (eight) hours as needed for nausea or vomiting (begin on day 3 after chemo). 30 tablet 1   oxyCODONE (OXY IR/ROXICODONE) 5 MG immediate release tablet Take 1 tablet (5 mg total) by mouth every 8 (eight) hours as needed for severe pain. 60 tablet 0   oxyCODONE-acetaminophen (PERCOCET/ROXICET) 5-325 MG tablet Take 1 tablet by mouth every 6 (six) hours as needed for severe pain.     pantoprazole (PROTONIX) 40 MG tablet TAKE 1 TABLET BY MOUTH EVERY DAY 90 tablet 1   prochlorperazine (COMPAZINE) 10 MG tablet Take 1 tablet (10 mg total) by mouth every 6 (six) hours as needed (Nausea or vomiting). 30 tablet 1   sacubitril-valsartan (ENTRESTO) 97-103 MG Take 1 tablet by mouth 2 (two) times daily. 180 tablet 3   Semaglutide (RYBELSUS) 7 MG TABS Take 7 mg by mouth daily. 90 tablet 3   spironolactone (ALDACTONE) 25 MG tablet Take 25 mg by mouth daily. (Patient not taking: Reported on 02/06/2022)     vitamin B-12 (CYANOCOBALAMIN) 1000 MCG tablet Take 1,000 mcg by mouth daily.     vitamin C (ASCORBIC ACID) 500 MG tablet Take 500 mg by mouth in the morning, at noon, in the evening, and at bedtime.     zinc gluconate 50 MG tablet Take 50 mg by mouth daily.     Current Facility-Administered Medications   Medication Dose Route Frequency Provider Last Rate Last Admin   insulin glulisine (APIDRA) injection 6 Units  6 Units Subcutaneous Once Williams, Lynne B, PA-C        PHYSICAL EXAMINATION: ECOG PERFORMANCE STATUS: 1 - Symptomatic but completely ambulatory  Vitals:   02/08/22 0846  BP: (!) 151/97  Pulse: 76  Resp: 15  Temp: 98.2 F (36.8 C)  SpO2: 98%   Wt Readings from Last 3 Encounters:  02/08/22 224 lb 9.6 oz (101.9 kg)  02/06/22 223 lb 6.4 oz (101.3 kg)  01/15/22 214 lb (97.1 kg)     GENERAL:alert, no distress and comfortable SKIN: skin color, texture, turgor are normal, no rashes or significant lesions EYES: normal, Conjunctiva are pink and non-injected, sclera clear  LUNGS: clear to auscultation and percussion with normal breathing effort HEART: regular rate & rhythm, (+) lower extremity edema R>L, (+) mild murmur present NEURO: alert & oriented x 3 with fluent speech, no focal motor/sensory deficits  LABORATORY DATA:  I have reviewed the data as listed    Latest Ref Rng & Units 02/08/2022    8:12 AM 01/11/2022    8:28 AM 12/27/2021    8:59 AM  CBC  WBC 4.0 - 10.5 K/uL 4.5  5.6  3.8   Hemoglobin 12.0 - 15.0 g/dL 10.1  10.3  9.7   Hematocrit 36.0 - 46.0 % 31.2  30.8  29.1   Platelets 150 - 400 K/uL 262  337  233         Latest Ref Rng & Units 02/08/2022    8:12 AM 01/11/2022    8:28  AM 12/27/2021    8:59 AM  CMP  Glucose 70 - 99 mg/dL 114  91  105   BUN 8 - 23 mg/dL $Remove'13  11  10   'idHFzgm$ Creatinine 0.44 - 1.00 mg/dL 0.70  0.68  0.62   Sodium 135 - 145 mmol/L 141  139  138   Potassium 3.5 - 5.1 mmol/L 4.2  4.1  3.9   Chloride 98 - 111 mmol/L 110  106  107   CO2 22 - 32 mmol/L $RemoveB'26  27  27   'hYTdeQys$ Calcium 8.9 - 10.3 mg/dL 9.5  9.7  9.0   Total Protein 6.5 - 8.1 g/dL 7.9  7.4  7.2   Total Bilirubin 0.3 - 1.2 mg/dL 0.4  0.5  0.4   Alkaline Phos 38 - 126 U/L 82  123  91   AST 15 - 41 U/L 12  36  12   ALT 0 - 44 U/L 7  40  12       RADIOGRAPHIC STUDIES: I have personally  reviewed the radiological images as listed and agreed with the findings in the report. VAS [place] LOWER EXTREMITY VENOUS (DVT)  Result Date: 02/06/2022  Lower Venous DVT Study Patient Name:  Kristin Ward  Date of Exam:   02/06/2022 Medical Rec #: [id number]            Accession #:    [id number] Date of Birth: November 03, 1955           Patient Gender: F Patient Age:   79 years Exam Location[facility]  [facility] Procedure:      VAS [place] LOWER EXTREMITY VENOUS (DVT) Referring Phys: Quillian Quince [name] --------------------------------------------------------------------------------  Indications: Edema.  Risk Factors: Cancer Breast primary, multiple metastatic sites. Comparison Study: No prior study Performing Technologist: Maudry Mayhew MHA, RDMS, RVT, RDCS  Examination Guidelines: A complete evaluation includes B-mode imaging, spectral Doppler, color Doppler, and power Doppler as needed of all accessible portions of each vessel. Bilateral testing is considered an integral part of a complete examination. Limited examinations for reoccurring indications may be performed as noted. The reflux portion of the exam is performed with the patient in reverse Trendelenburg.  +---------+---------------+---------+-----------+----------+--------------+ RIGHT    CompressibilityPhasicitySpontaneityPropertiesThrombus Aging +---------+---------------+---------+-----------+----------+--------------+ CFV      Full           Yes      Yes                                 +---------+---------------+---------+-----------+----------+--------------+ SFJ      Full                                                        +---------+---------------+---------+-----------+----------+--------------+ FV Prox  Full                                                        +---------+---------------+---------+-----------+----------+--------------+ FV Mid   Full                                                         +---------+---------------+---------+-----------+----------+--------------+  FV DistalFull                                                        +---------+---------------+---------+-----------+----------+--------------+ PFV      Full                                                        +---------+---------------+---------+-----------+----------+--------------+ POP      Full           Yes      Yes                                 +---------+---------------+---------+-----------+----------+--------------+ PTV      Full                                                        +---------+---------------+---------+-----------+----------+--------------+ PERO     Full                                                        +---------+---------------+---------+-----------+----------+--------------+   +----+---------------+---------+-----------+----------+--------------+ LEFTCompressibilityPhasicitySpontaneityPropertiesThrombus Aging +----+---------------+---------+-----------+----------+--------------+ CFV Full           Yes      Yes                                 +----+---------------+---------+-----------+----------+--------------+     Summary: RIGHT: - There is no evidence of deep vein thrombosis in the lower extremity.  - No cystic structure found in the popliteal fossa. - Ultrasound characteristics of atypical, enlarged lymph nodes are noted in the groin.  LEFT: - No evidence of common femoral vein obstruction.  *See table(s) above for measurements and observations. Electronically signed by Orlie Pollen on 02/06/2022 at 5:37:51 PM.    Final       Orders Placed This Encounter  Procedures   CT CHEST ABDOMEN PELVIS W CONTRAST    Standing Status:   Future    Standing Expiration Date:   02/09/2023    Order Specific Question:   Preferred imaging location?    Answer:   [facility]    Order Specific Question:   Release to patient    Answer:   Immediate     Order Specific Question:   Is Oral Contrast requested for this exam?    Answer:   Yes, Per Radiology protocol   All questions were answered. The patient knows to call the clinic with any problems, questions or concerns. No barriers to learning was detected. The total time spent in the appointment was 30 minutes.     [name], MD 02/08/2022   I, Wilburn Mylar, am acting as scribe for [name], MD.   I have reviewed the above documentation  for accuracy and completeness, and I agree with the above.     

## 2022-02-09 LAB — CANCER ANTIGEN 27.29: CA 27.29: 67.8 U/mL — ABNORMAL HIGH (ref 0.0–38.6)

## 2022-02-11 ENCOUNTER — Ambulatory Visit: Payer: Medicare Other | Attending: Radiation Oncology

## 2022-02-11 DIAGNOSIS — C50512 Malignant neoplasm of lower-outer quadrant of left female breast: Secondary | ICD-10-CM | POA: Insufficient documentation

## 2022-02-11 DIAGNOSIS — C774 Secondary and unspecified malignant neoplasm of inguinal and lower limb lymph nodes: Secondary | ICD-10-CM | POA: Insufficient documentation

## 2022-02-11 DIAGNOSIS — Z510 Encounter for antineoplastic radiation therapy: Secondary | ICD-10-CM | POA: Insufficient documentation

## 2022-02-13 ENCOUNTER — Ambulatory Visit: Payer: Medicare Other

## 2022-02-14 ENCOUNTER — Ambulatory Visit: Payer: Medicare Other

## 2022-02-14 MED FILL — Dexamethasone Sodium Phosphate Inj 100 MG/10ML: INTRAMUSCULAR | Qty: 1 | Status: AC

## 2022-02-15 ENCOUNTER — Inpatient Hospital Stay: Payer: Medicare Other | Attending: Hematology

## 2022-02-15 ENCOUNTER — Encounter: Payer: Self-pay | Admitting: Hematology

## 2022-02-15 ENCOUNTER — Inpatient Hospital Stay (HOSPITAL_BASED_OUTPATIENT_CLINIC_OR_DEPARTMENT_OTHER): Payer: Medicare Other | Admitting: Hematology

## 2022-02-15 ENCOUNTER — Ambulatory Visit: Payer: Medicare Other

## 2022-02-15 ENCOUNTER — Other Ambulatory Visit: Payer: Self-pay

## 2022-02-15 ENCOUNTER — Inpatient Hospital Stay: Payer: Medicare Other

## 2022-02-15 VITALS — BP 126/78 | HR 89 | Temp 98.2°F | Resp 16 | Wt 214.0 lb

## 2022-02-15 DIAGNOSIS — Z7189 Other specified counseling: Secondary | ICD-10-CM | POA: Diagnosis not present

## 2022-02-15 DIAGNOSIS — H3552 Pigmentary retinal dystrophy: Secondary | ICD-10-CM | POA: Diagnosis not present

## 2022-02-15 DIAGNOSIS — G629 Polyneuropathy, unspecified: Secondary | ICD-10-CM | POA: Diagnosis not present

## 2022-02-15 DIAGNOSIS — Z5112 Encounter for antineoplastic immunotherapy: Secondary | ICD-10-CM | POA: Insufficient documentation

## 2022-02-15 DIAGNOSIS — Z171 Estrogen receptor negative status [ER-]: Secondary | ICD-10-CM | POA: Diagnosis not present

## 2022-02-15 DIAGNOSIS — C7951 Secondary malignant neoplasm of bone: Secondary | ICD-10-CM | POA: Insufficient documentation

## 2022-02-15 DIAGNOSIS — C774 Secondary and unspecified malignant neoplasm of inguinal and lower limb lymph nodes: Secondary | ICD-10-CM | POA: Insufficient documentation

## 2022-02-15 DIAGNOSIS — J449 Chronic obstructive pulmonary disease, unspecified: Secondary | ICD-10-CM | POA: Insufficient documentation

## 2022-02-15 DIAGNOSIS — C50512 Malignant neoplasm of lower-outer quadrant of left female breast: Secondary | ICD-10-CM

## 2022-02-15 DIAGNOSIS — I5042 Chronic combined systolic (congestive) and diastolic (congestive) heart failure: Secondary | ICD-10-CM | POA: Diagnosis not present

## 2022-02-15 DIAGNOSIS — Z95828 Presence of other vascular implants and grafts: Secondary | ICD-10-CM

## 2022-02-15 DIAGNOSIS — E1165 Type 2 diabetes mellitus with hyperglycemia: Secondary | ICD-10-CM | POA: Insufficient documentation

## 2022-02-15 DIAGNOSIS — Z452 Encounter for adjustment and management of vascular access device: Secondary | ICD-10-CM | POA: Insufficient documentation

## 2022-02-15 DIAGNOSIS — E559 Vitamin D deficiency, unspecified: Secondary | ICD-10-CM | POA: Diagnosis not present

## 2022-02-15 DIAGNOSIS — I110 Hypertensive heart disease with heart failure: Secondary | ICD-10-CM | POA: Insufficient documentation

## 2022-02-15 MED ORDER — DEXTROSE 5 % IV SOLN: Freq: Once | INTRAVENOUS | Status: AC

## 2022-02-15 MED ORDER — ZOLEDRONIC ACID 4 MG/100ML IV SOLN
4.0000 mg | Freq: Once | INTRAVENOUS | Status: AC
  Administered 2022-02-15: 4 mg via INTRAVENOUS
  Filled 2022-02-15: qty 100

## 2022-02-15 MED ORDER — CLONIDINE HCL 0.1 MG PO TABS: 0.1000 mg | ORAL_TABLET | Freq: Once | ORAL | Status: DC

## 2022-02-15 MED ORDER — SODIUM CHLORIDE 0.9 % IV SOLN
10.0000 mg | Freq: Once | INTRAVENOUS | Status: AC
  Administered 2022-02-15: 10 mg via INTRAVENOUS
  Filled 2022-02-15: qty 10

## 2022-02-15 MED ORDER — PROCHLORPERAZINE MALEATE 10 MG PO TABS: 10.0000 mg | ORAL_TABLET | Freq: Four times a day (QID) | ORAL | 1 refills | Status: DC | PRN

## 2022-02-15 MED ORDER — FAM-TRASTUZUMAB DERUXTECAN-NXKI CHEMO 100 MG IV SOLR
5.1400 mg/kg | Freq: Once | INTRAVENOUS | Status: AC
  Administered 2022-02-15: 500 mg via INTRAVENOUS
  Filled 2022-02-15: qty 25

## 2022-02-15 MED ORDER — CLONIDINE HCL 0.1 MG PO TABS
0.1000 mg | ORAL_TABLET | Freq: Once | ORAL | Status: AC
  Administered 2022-02-15: 0.1 mg via ORAL
  Filled 2022-02-15: qty 1

## 2022-02-15 MED ORDER — SODIUM CHLORIDE 0.9 % IV SOLN: Freq: Once | INTRAVENOUS | Status: DC

## 2022-02-15 MED ORDER — HEPARIN SOD (PORK) LOCK FLUSH 100 UNIT/ML IV SOLN
500.0000 [IU] | Freq: Once | INTRAVENOUS | Status: AC | PRN
  Administered 2022-02-15: 500 [IU]

## 2022-02-15 MED ORDER — SODIUM CHLORIDE 0.9% FLUSH
10.0000 mL | INTRAVENOUS | Status: DC | PRN
  Administered 2022-02-15: 10 mL

## 2022-02-15 MED ORDER — ACETAMINOPHEN 325 MG PO TABS
650.0000 mg | ORAL_TABLET | Freq: Once | ORAL | Status: AC
  Administered 2022-02-15: 650 mg via ORAL
  Filled 2022-02-15: qty 2

## 2022-02-15 MED ORDER — DIPHENHYDRAMINE HCL 25 MG PO CAPS
50.0000 mg | ORAL_CAPSULE | Freq: Once | ORAL | Status: AC
  Administered 2022-02-15: 50 mg via ORAL
  Filled 2022-02-15: qty 2

## 2022-02-15 MED ORDER — PALONOSETRON HCL INJECTION 0.25 MG/5ML
0.2500 mg | Freq: Once | INTRAVENOUS | Status: AC
  Administered 2022-02-15: 0.25 mg via INTRAVENOUS
  Filled 2022-02-15: qty 5

## 2022-02-15 NOTE — Patient Instructions (Signed)
[facility]  Discharge Instructions: Thank you for choosing [facility] to provide your oncology and hematology care.   If you have a lab appointment with the [facility], please go directly to the [facility] and check in at the registration area.   Wear comfortable clothing and clothing appropriate for easy access to any Portacath or PICC line.   We strive to give you quality time with your provider. You may need to reschedule your appointment if you arrive late (15 or more minutes).  Arriving late affects you and other patients whose appointments are after yours.  Also, if you miss three or more appointments without notifying the office, you may be dismissed from the clinic at the provider's discretion.      For prescription refill requests, have your pharmacy contact our office and allow 72 hours for refills to be completed.    Today you received the following chemotherapy and/or immunotherapy agents: Enhertu   To help prevent nausea and vomiting after your treatment, we encourage you to take your nausea medication as directed.  BELOW ARE SYMPTOMS THAT SHOULD BE REPORTED IMMEDIATELY: *FEVER GREATER THAN 100.4 F (38 C) OR HIGHER *CHILLS OR SWEATING *NAUSEA AND VOMITING THAT IS NOT CONTROLLED WITH YOUR NAUSEA MEDICATION *UNUSUAL SHORTNESS OF BREATH *UNUSUAL BRUISING OR BLEEDING *URINARY PROBLEMS (pain or burning when urinating, or frequent urination) *BOWEL PROBLEMS (unusual diarrhea, constipation, pain near the anus) TENDERNESS IN MOUTH AND THROAT WITH OR WITHOUT PRESENCE OF ULCERS (sore throat, sores in mouth, or a toothache) UNUSUAL RASH, SWELLING OR PAIN  UNUSUAL VAGINAL DISCHARGE OR ITCHING   Items with * indicate a potential emergency and should be followed up as soon as possible or go to the Emergency Department if any problems should occur.  Please show the CHEMOTHERAPY ALERT CARD or IMMUNOTHERAPY ALERT CARD at check-in to the  Emergency Department and triage nurse.  Should you have questions after your visit or need to cancel or reschedule your appointment, please contact [facility]  Dept: [phone]  and follow the prompts.  Office hours are 8:00 a.m. to 4:30 p.m. Monday - Friday. Please note that voicemails left after 4:00 p.m. may not be returned until the following business day.  We are closed weekends and major holidays. You have access to a nurse at all times for urgent questions. Please call the main number to the clinic Dept: [phone] and follow the prompts.   For any non-urgent questions, you may also contact your provider using MyChart. We now offer e-Visits for anyone 55 and older to request care online for non-urgent symptoms. For details visit [email or web address].   Also download the MyChart app! Go to the app store, search "MyChart", open the app, select [facility], and log in with your MyChart username and password.  Masks are optional in the cancer centers. If you would like for your care team to wear a mask while they are taking care of you, please let them know. For doctor visits, patients may have with them one support person who is at least 64 years old. At this time, visitors are not allowed in the infusion area.

## 2022-02-15 NOTE — Progress Notes (Signed)
[place]   [phone]) [phone] [phone]   Clinic Follow up Note   Patient Care Team: [name] as PCP - General (Physician Assistant) [name], MD as PCP - Cardiology (Cardiology) [name], MD as Consulting Physician (Hematology) [name], MD as Consulting Physician (General Surgery) [name], [facility] as Pharmacist (Pharmacist) Jacelyn Pi, MD as Referring Physician (Endocrinology)  Date of Service:  02/15/2022  CHIEF COMPLAINT: f/u of metastatic breast cancer  CURRENT THERAPY:  Enhertu, q3weeks, starting 02/15/22  ASSESSMENT & PLAN:  Kristin Ward is a 66 y.o. female with   1. Left breast invasive ductal carcinoma, stage IB, p(T1cN0M0), Triple negative, Grade 3, in 2018, metastatic disease in 08/2020 ER 20% weakly + PR/HER2 (0) negative, PD-L1 0%, genetics (-), HER2 1+ on node biopsy  -diagnosed with triple negative breast cancer in 05/2017. She is s/p left breast lumpectomy, adjuvant chemo TC and Radiation.  -Genetic testing was negative for pathogenetic mutations.  -Due to new right gluteal pain, she underwent biopsy on 09/02/20 which showed metastatic carcinoma, consistent with breast primary. ER 20% positive, PR and HER2 negative.  -she received 4 cycles of Taxol 09/26/20 - 12/18/20, discontinued due to worsening neuropathy. She then took Xeloda from 01/01/21 - 06/2021. -she switched to [facility] 07/03/21 - 01/11/22 due to disease progression. Dose reduced due to nausea and diarrhea. Chemo held for one dose in 09/2021 due to uncontrolled hyperglycemia.  -restaging CT CAP on 11/27/21 showed: stable right iliac wing lesion; increase in volume of inguinal lymph nodes, overall similar from prior and improved from 06/2021. -her CA 27.29 initially responded well to treatment but has been rising, up to 62.1 on 01/11/22. -biopsy of a right inguinal lymph node on 01/15/22 showed adenocarcinoma, consistent with metastatic breast  carcinoma. Prognostic panel confirmed triple negative disease (Her2 1+ low expression).  -she received palliative radiation to the inguinal nodes under Dr. Lisbeth Renshaw 01/28/22 - 02/18/22.  -she is scheduled to begin Enhertu today. She reports nausea this week but denies taking any medicine for it. She is receiving aloxi as part of her premeds. Will monitor on treatment. Her BP is also high, and she admits she has not been consistently taking her medication. We will also give her clonidine today. -she is scheduled for new baseline CT on 02/19/22.   2. Chronic combined systolic and diastolic heart failure -due to concerns for reversible cardiotoxicity with Enhertu, she was evaluated by Dr. Haroldine Laws. Echo on 01/31/22 showed EF 35-40%, GLS -12.9%, RV mildly reduced.  -will monitor with repeat echo 04/01/22   3. Diabetes and uncontrolled hyperglycemia -worsening hyperglycemia lately, probably related to dexa (before chemo) and Trodelvy  -her hyperglycemia is now better controlled on 25u Lantus and sliding scale insulin.   4. Bone metastases, Vit D deficiency -She had radiating right hip pain since 2020 from right iliac wing mass -She completed Palliative radiation to right iliac 09/04/20 - 09/22/20 with Dr. Lisbeth Renshaw, and her pain is much improved.  -Given localized bone met, she started Zometa q32month on 11/20/20. Most recent dose 11/08/21 -vit D has remained low, most recently 20.86 on 08/17/21.   5. G2 peripheral Neuropathy  -S/p C2D8 Taxol she developed mild tingling in her hands and feet. Taxol stopped after 12/18/20. Her neuropathy is improving.  -Overall stable   6. DM, HTN, COPD, retinitis pigmentosa with limited vision -Continue to follow-up with PCP Dr LRedmond Schooland cardiologist Dr HEllyn Hack -she is legally blind   7. Goal  of care discussion  -The patient understands the goal of care is palliative. -she is full code now      Plan -proceed with Enhertu as scheduled -CT CAP on 02/19/22 as a new baseline   -lab, flush, and f/u with PA Cassie on 7/14 for toxicity check -f/u with me in 3 weeks with second cycle Enhertu    No problem-specific Assessment & Plan notes found for this encounter.   SUMMARY OF ONCOLOGIC HISTORY: Oncology History Overview Note  Cancer Staging Malignant neoplasm of lower-outer quadrant of left breast of female, estrogen receptor negative ([place]) Staging form: Breast, AJCC 8th Edition - Clinical stage from 06/06/2017: Stage IB (cT1c, cN0, cM0, G3, ER-, PR-, HER2-) - Signed by [name], MD on 06/15/2017 Nuclear grade: G3 Histologic grading system: 3 grade system - Pathologic stage from 06/26/2017: Stage IB (pT1c, pN0, cM0, G3, ER-, PR-, HER2-) - Signed by [name], MD on 07/10/2017 Neoadjuvant therapy: No Nuclear grade: G3 Multigene prognostic tests performed: None Histologic grading system: 3 grade system Laterality: Left     Malignant neoplasm of lower-outer quadrant of left breast of female, estrogen receptor negative ([place])  05/30/2017 Mammogram   Diagnostic mammo and US IMPRESSION: 1. Highly suspicious 1.4 cm mass in the slightly lower slightly outer left breast -tissue sampling recommended. 2. Indeterminate 0.5 mm mass in the slightly lower slightly outer left breast -tissue sampling recommended. 3. At least 2 left axillary lymph nodes with borderline cortical thickness.   06/06/2017 Initial Biopsy   Diagnosis 1. Breast, left, needle core biopsy, 5:30 o'clock - INVASIVE DUCTAL CARCINOMA, G3 2. Lymph node, needle/core biopsy, left axillary - NO CARCINOMA IDENTIFIED IN ONE LYMPH NODE (0/1)   06/06/2017 Initial Diagnosis   Malignant neoplasm of lower-outer quadrant of left breast of female, estrogen receptor negative ([place])   06/06/2017 Receptors her2   Estrogen Receptor: 0%, NEGATIVE Progesterone Receptor: 0%, NEGATIVE Proliferation Marker Ki67: 70%   06/26/2017 Surgery   LEFT BREAST LUMPECTOMY WITH RADIOACTIVE SEED AND SENTINEL LYMPH NODE  BIOPSY ERAS  PATHWAY AND INSERTION PORT-A-CATH By Dr. Barry Dienes on 06/26/17    06/26/2017 Pathology Results   Diagnosis 06/26/17  1. Breast, lumpectomy, Left - INVASIVE DUCTAL CARCINOMA, GRADE III/III, SPANNING 1.2 CM. - THE SURGICAL RESECTION MARGINS ARE NEGATIVE FOR CARCINOMA. - SEE ONCOLOGY TABLE BELOW. 2. Lymph node, sentinel, biopsy, Left axillary #1 - THERE IS NO EVIDENCE OF CARCINOMA IN 1 OF 1 LYMPH NODE (0/1). 3. Lymph node, sentinel, biopsy, Left axillary #2 - THERE IS NO EVIDENCE OF CARCINOMA IN 1 OF 1 LYMPH NODE (0/1). 4. Lymph node, sentinel, biopsy, Left axillary #3 - THERE IS NO EVIDENCE OF CARCINOMA IN 1 OF 1 LYMPH NODE (0/1).    07/25/2017 - 09/29/2017 Chemotherapy   Adjuvant cytoxan and docetaxel (TC) every 3 weekd for 4 cycles     11/19/2017 - 12/17/2017 Radiation Therapy   Adjuvant breast radiation Left breast treated to 42.5 Gy with 17 fx of 2.5 Gy followed by a boost of 7.5 Gy with 3 fx of 2.5 Gy      02/2019 Procedure   She had PAC removal in 02/2019.    07/23/2020 Imaging   MRI Lumbar Spine 07/23/20 IMPRESSION: 1. No significant disc herniation, spinal canal or neural foraminal stenosis at any level. 2. Moderate facet degenerative changes at L3-4, L4-5 and L5-S1. 3. Multiple enlarged retroperitoneal and right iliac lymph nodes. Recommend correlation with CT of the abdomen and pelvis with contrast.   08/15/2020 Imaging   CT AP  08/15/20  IMPRESSION: 1. Right iliac and periaortic adenopathy is noted concerning for metastatic disease or lymphoma. Also noted is abnormal soft tissue mass anterior and posterior to the right iliac wing concerning for malignancy or metastatic disease. MRI is recommended for further evaluation. 2. Irregular lucency with sclerotic margins is seen involving the right iliac crest concerning for possible lytic lesion. MRI may be performed for further evaluation. 3. Enlarged fibroid uterus. 4. Small gallstone. 5. Aortic  atherosclerosis.   08/22/2020 Pathology Results   FINAL MICROSCOPIC DIAGNOSIS: 08/22/20  A. NEEDLE CORE, RIGHT GLUTEUS MINIMUMUS, BIOPSY:  -  Metastatic carcinoma  -  See comment   COMMENT:  By immunohistochemistry, the neoplastic cells are positive for  cytokeratin-7 and have weak patchy positivity for GATA3 but are negative for cytokeratin-20 and GCDFP.  The combined morphology and immunophenotype, support metastasis from the patient's known breast primary carcinoma.   ADDENDUM:  PROGNOSTIC INDICATOR RESULTS:  The tumor cells are NEGATIVE for Her2 (0).  Estrogen Receptor:       POSITIVE, 20%, WEAK STAINING  Progesterone Receptor:   NEGATIVE   PD-L1 (0) negative    09/04/2020 - 09/22/2020 Radiation Therapy   Palliative radiation to right iliac 09/04/20 - 09/22/20, Dr. Lisbeth Renshaw   09/26/2020 -  Chemotherapy   first line weekly Taxol 3 weeks on/1 week off starting 09/26/20 --Reduced to 2 weeks on/1 week off starting with C3 due to neuropathy. Stopped after C4 on 12/18/20 due to neuratrophy.  --Switched to Xeloda on 01/01/21 at 1553m in the AM and 20045min the PM for 2 weeks on/1 week off     10/13/2020 Procedure    PAC placement on 10/13/20.    04/10/2021 Imaging   CT CAP  IMPRESSION: 1. Enlarging abdominopelvic retroperitoneal and right inguinal adenopathy, compatible with disease progression. 2. Lytic metastasis in the right iliac wing, similar. 3. Hepatomegaly. 4. Cholelithiasis. 5. Enlarged fibroid uterus. 6.  Aortic atherosclerosis (ICD10-I70.0).   06/18/2021 Imaging   CT CAP  IMPRESSION: 1. Today's study demonstrates progression of metastatic disease as evidence by increased number and size of numerous enlarged lymph nodes in the retroperitoneum, along the right pelvic sidewall, and in the upper right thigh. 2. Osseous metastases in the bony pelvis appear similar to the prior examination. No definite new osseous lesions are otherwise noted. 3. Fibroid uterus again noted. 4.  Cholelithiasis without evidence of acute cholecystitis. 5. Aortic atherosclerosis, in addition to left anterior descending coronary artery disease. Please note that although the presence of coronary artery calcium documents the presence of coronary artery disease, the severity of this disease and any potential stenosis cannot be assessed on this non-gated CT examination. Assessment for potential risk factor modification, dietary therapy or pharmacologic therapy may be warranted, if clinically indicated. 6. Additional incidental findings, as above.   06/20/2021 Imaging   Bone Scan  IMPRESSION: No osseous uptake suspicious for recurrent/progressive metastatic disease.   07/03/2021 - 01/11/2022 Chemotherapy   Patient is on Treatment Plan : BREAST METASTATIC Sacituzumab govitecan-hziy (TIvette Loyalq21d     09/27/2021 Imaging   EXAM: CT CHEST, ABDOMEN, AND PELVIS WITH CONTRAST  IMPRESSION: 1. Marked interval improvement in previously demonstrated lymphadenopathy in the abdomen and pelvis. A few prominent right inguinal lymph nodes remain. 2. Stable osseous metastatic disease in the pelvis. 3. No evidence of local recurrence or other metastatic disease. 4. Stable incidental findings including cholelithiasis, uterine fibroids and Aortic Atherosclerosis (ICD10-I70.0).  ADDENDUM: Not mentioned in the original impression is a possible nonocclusive fibrin sheath  along the distal aspect of the Port-A-Cath.   09/27/2021 Imaging   EXAM: NUCLEAR MEDICINE WHOLE BODY BONE SCAN  IMPRESSION: Stable examination, without abnormal osseous uptake.   11/27/2021 Imaging   CT AP IMPRESSION: 1. Stable lytic and sclerotic lesion in the RIGHT iliac wing. 2. No evidence of visceral metastasis or adenopathy in the abdomenpelvis. 3. Leiomyomatous uterus.  ADDENDUM REPORT: 11/29/2021 09:33 Upon further review and discussion with Cira Rue NP, the RIGHT inguinal lymph nodes previous described have increased  in volume. For example 14 mm node (image 91/2) is increased from 9 mm. Adjacent 14 mm node on image 86 is increased from 8 mm. Additionally, LEFT common iliac lymph node is also slightly increased measuring 10 mm (46/2) compared to 7 mm on comparison exam from 09/27/2021. These sites demonstrates similar but greater lymphadenopathy on CT 06/18/2021.   02/15/2022 -  Chemotherapy   Patient is on Treatment Plan : BREAST METASTATIC fam-trastuzumab deruxtecan-nxki (Enhertu) q21d        INTERVAL HISTORY:  Kristin Ward is here for a follow up of metastatic breast cancer. She was last seen by me on 02/08/22. She was seen in the infusion area. She reports she has been nauseated all this week. She denies pain or vomiting.   All other systems were reviewed with the patient and are negative.  MEDICAL HISTORY:  Past Medical History:  Diagnosis Date   Abnormal x-ray of lungs with single pulmonary nodule 03/15/2016   per records from [place]    Anemia    Asthma    Cholelithiasis 05/19/2017   On CT   Coronary artery calcification seen on CAT scan 05/19/2017   Diabetes mellitus without complication ([place])    type 2   Diabetes mellitus, new onset ([place]) 01/20/2019   Dilated idiopathic cardiomyopathy ([place]) 12/2015   EF 15-20%. Diagnosed in [facility]   Headache    NONE RECENT   Hypertension    Lymph nodes enlarged 07/25/2020   Malignant neoplasm of lower-outer quadrant of left breast of female, estrogen receptor negative ([place]) 06/11/2017   left breast   Mixed hyperlipidemia 06/17/2018   partially blind    Personal history of chemotherapy 2018-2019   Personal history of radiation therapy 2019   Retinitis pigmentosa    Retroperitoneal lymphadenopathy 07/25/2020   Uveitis     SURGICAL HISTORY: Past Surgical History:  Procedure Laterality Date   brain cyst removed  2007   to help with headaches per patient , aspirated    BREAST BIOPSY Left 06/06/2017   x2   BREAST CYST  ASPIRATION Left 06/06/2017   BREAST LUMPECTOMY Left 06/26/2017   BREAST LUMPECTOMY WITH RADIOACTIVE SEED AND SENTINEL LYMPH NODE BIOPSY Left 06/26/2017   Procedure: BREAST LUMPECTOMY WITH RADIOACTIVE SEED AND SENTINEL LYMPH NODE BIOPSY ERAS  PATHWAY;  Surgeon: [name], MD;  Location: [place];  Service: General;  Laterality: Left;  pec block   FINE NEEDLE ASPIRATION Left 02/23/2019   Procedure: FINE NEEDLE ASPIRATION;  Surgeon: [name], MD;  Location: [place];  Service: General;  Laterality: Left;  Asiration of left breast seroma    IR IMAGING GUIDED PORT INSERTION  10/13/2020   NASAL TURBINATE REDUCTION     PORT-A-CATH REMOVAL Right 02/23/2019   Procedure: REMOVAL PORT-A-CATH;  Surgeon: [name], MD;  Location: [place];  Service: General;  Laterality: Right;   PORTACATH PLACEMENT N/A 06/26/2017   Procedure: INSERTION PORT-A-CATH;  Surgeon: [name], MD;  Location: [place];  Service: General;  Laterality: N/A;   TRANSTHORACIC ECHOCARDIOGRAM  12/2015   A) [facility] May 2017: Mild concentric LVH. Global hypokinesis. GR daily. EF 18% severe LA dilation. Mitral annular dilatation with papillary muscle dysfunction and moderate MR. Dilated IVC consistent with elevated RAP.   TRANSTHORACIC ECHOCARDIOGRAM  05/2017; 08/2017   a) Mild concentric LVH.  EF 45 to 50% with diffuse hypokinesis.  GR 1 DD.  Mild aortic root dilation.;; b)  Mild LVH EF 45 to 50%.  Diffuse HK.  No significant valve disease.  No significant change.    I have reviewed the social history and family history with the patient and they are unchanged from previous note.  ALLERGIES:  is allergic to morphine and related, latex, tylenol with codeine #3 [acetaminophen-codeine], and penicillins.  MEDICATIONS:  Current Outpatient Medications  Medication Sig Dispense Refill   Accu-Chek FastClix Lancets MISC TEST TWICE A DAY. PT USES AN ACCU-CHEK GUIDE ME METER 102 each 2    amLODipine (NORVASC) 5 MG tablet Take 1 tablet (5 mg total) by mouth daily. 30 tablet 3   aspirin 81 MG chewable tablet Chew 81 mg by mouth daily.     atorvastatin (LIPITOR) 20 MG tablet Take 1 tablet (20 mg total) by mouth daily. 90 tablet 3   brimonidine (ALPHAGAN) 0.2 % ophthalmic solution Place 1 drop into both eyes 2 (two) times daily.     carvedilol (COREG) 12.5 MG tablet Take 1 tablet (12.5 mg total) by mouth 2 (two) times daily with a meal. 180 tablet 1   cholecalciferol (VITAMIN D3) 25 MCG (1000 UNIT) tablet Take 2,000 Units by mouth daily.     Continuous Blood Gluc Receiver (DEXCOM G7 RECEIVER) DEVI 1 Device by Does not apply route 2 (two) times daily. 1 each 1   Continuous Blood Gluc Sensor (DEXCOM G7 SENSOR) MISC Use as directed, replace every 10 DAYS 3 each 10   furosemide (LASIX) 20 MG tablet TAKE 2 TABLETS (40 MG TOTAL) BY MOUTH AS NEEDED. FOR WEIGHT GAIN 3 TO 4 LBS. 90 tablet 3   HUMALOG KWIKPEN 100 UNIT/ML KwikPen Inject 3-10 Units into the skin 3 (three) times daily before meals. Per sliding scale     ibuprofen (ADVIL) 800 MG tablet Take 1 tablet (800 mg total) by mouth every 8 (eight) hours as needed. 30 tablet 2   insulin glargine (LANTUS) 100 UNIT/ML injection Inject 25 Units into the skin at bedtime. 15 units in am  on chemo week     Lancets Misc. (ACCU-CHEK SOFTCLIX LANCET DEV) KIT 1 Units by Does not apply route 2 (two) times daily. Check FSBS BID - Include strips # 50 with 5 refills, Lancets #50 with 5 refills DX: Type 2 DM - ICD 10: E11.9 1 kit 0   lidocaine-prilocaine (EMLA) cream Apply 1 application topically as needed. 30 g 0   linaclotide (LINZESS) 72 MCG capsule Take 72 mcg by mouth as needed.     metFORMIN (GLUCOPHAGE) 1000 MG tablet Take 1 tablet (1,000 mg total) by mouth 2 (two) times daily with a meal. 180 tablet 3   Multiple Vitamins-Minerals (WOMENS MULTI PO) Take 15 mLs by mouth daily.     ondansetron (ZOFRAN) 8 MG tablet Take 1 tablet (8 mg total) by mouth  every 8 (eight) hours as needed for nausea or vomiting (begin on day 3 after chemo). 30 tablet 1   oxyCODONE (OXY IR/ROXICODONE) 5 MG immediate release tablet Take 1 tablet (5 mg total) by mouth every 8 (  eight) hours as needed for severe pain. 60 tablet 0   oxyCODONE-acetaminophen (PERCOCET/ROXICET) 5-325 MG tablet Take 1 tablet by mouth every 6 (six) hours as needed for severe pain.     pantoprazole (PROTONIX) 40 MG tablet TAKE 1 TABLET BY MOUTH EVERY DAY 90 tablet 1   prochlorperazine (COMPAZINE) 10 MG tablet Take 1 tablet (10 mg total) by mouth every 6 (six) hours as needed (Nausea or vomiting). 30 tablet 1   sacubitril-valsartan (ENTRESTO) 97-103 MG Take 1 tablet by mouth 2 (two) times daily. 180 tablet 3   Semaglutide (RYBELSUS) 7 MG TABS Take 7 mg by mouth daily. 90 tablet 3   spironolactone (ALDACTONE) 25 MG tablet Take 25 mg by mouth daily. (Patient not taking: Reported on 02/06/2022)     vitamin B-12 (CYANOCOBALAMIN) 1000 MCG tablet Take 1,000 mcg by mouth daily.     vitamin C (ASCORBIC ACID) 500 MG tablet Take 500 mg by mouth in the morning, at noon, in the evening, and at bedtime.     zinc gluconate 50 MG tablet Take 50 mg by mouth daily.     Current Facility-Administered Medications  Medication Dose Route Frequency Provider Last Rate Last Admin   cloNIDine (CATAPRES) tablet 0.1 mg  0.1 mg Oral Once [name], MD       insulin glulisine (APIDRA) injection 6 Units  6 Units Subcutaneous Once Francis Gaines B, PA-C        PHYSICAL EXAMINATION: ECOG PERFORMANCE STATUS: 1 - Symptomatic but completely ambulatory  There were no vitals filed for this visit. Wt Readings from Last 3 Encounters:  02/15/22 214 lb (97.1 kg)  02/08/22 224 lb 9.6 oz (101.9 kg)  02/06/22 223 lb 6.4 oz (101.3 kg)     GENERAL:alert, no distress and comfortable SKIN: skin color normal, no rashes or significant lesions EYES: normal, Conjunctiva are pink and non-injected, sclera clear  NEURO: alert & oriented x  3 with fluent speech  LABORATORY DATA:  I have reviewed the data as listed    Latest Ref Rng & Units 02/08/2022    8:12 AM 01/11/2022    8:28 AM 12/27/2021    8:59 AM  CBC  WBC 4.0 - 10.5 K/uL 4.5  5.6  3.8   Hemoglobin 12.0 - 15.0 g/dL 10.1  10.3  9.7   Hematocrit 36.0 - 46.0 % 31.2  30.8  29.1   Platelets 150 - 400 K/uL 262  337  233         Latest Ref Rng & Units 02/08/2022    8:12 AM 01/11/2022    8:28 AM 12/27/2021    8:59 AM  CMP  Glucose 70 - 99 mg/dL 114  91  105   BUN 8 - 23 mg/dL '13  11  10   ' Creatinine 0.44 - 1.00 mg/dL 0.70  0.68  0.62   Sodium 135 - 145 mmol/L 141  139  138   Potassium 3.5 - 5.1 mmol/L 4.2  4.1  3.9   Chloride 98 - 111 mmol/L 110  106  107   CO2 22 - 32 mmol/L '26  27  27   ' Calcium 8.9 - 10.3 mg/dL 9.5  9.7  9.0   Total Protein 6.5 - 8.1 g/dL 7.9  7.4  7.2   Total Bilirubin 0.3 - 1.2 mg/dL 0.4  0.5  0.4   Alkaline Phos 38 - 126 U/L 82  123  91   AST 15 - 41 U/L 12  36  12  ALT 0 - 44 U/L 7  40  12       RADIOGRAPHIC STUDIES: I have personally reviewed the radiological images as listed and agreed with the findings in the report. No results found.    No orders of the defined types were placed in this encounter.  All questions were answered. The patient knows to call the clinic with any problems, questions or concerns. No barriers to learning was detected.      [name], MD 02/15/2022   I, Wilburn Mylar, am acting as scribe for [name], MD.   I have reviewed the above documentation for accuracy and completeness, and I agree with the above.

## 2022-02-15 NOTE — Progress Notes (Signed)
Ok to treat with labs from 6/30 and elevated blood pressure per Dr. Burr Medico. 0.'1mg'$  clonidine to be given.

## 2022-02-18 ENCOUNTER — Encounter: Payer: Self-pay | Admitting: Hematology

## 2022-02-18 ENCOUNTER — Ambulatory Visit: Payer: Medicare Other

## 2022-02-18 NOTE — Progress Notes (Signed)
                                                                                                                                                             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 She developed fatigue and anticipated skin changes in the treatment field.   Plan: The patient will receive a call in about one month from the radiation oncology department. She will continue follow up with Dr. Chryl Heck as well.   ________________________________________________    Carola Rhine, [facility]

## 2022-02-18 NOTE — Progress Notes (Unsigned)
[facility] OFFICE PROGRESS NOTE  Kristin Pap, PA-C No address on file  DIAGNOSIS:  f/u of metastatic breast cancer  Oncology History Overview Note  Cancer Staging Malignant neoplasm of lower-outer quadrant of left breast of female, estrogen receptor negative ([place]) Staging form: Breast, AJCC 8th Edition - Clinical stage from 06/06/2017: Stage IB (cT1c, cN0, cM0, G3, ER-, PR-, HER2-) - Signed by [name], MD on 06/15/2017 Nuclear grade: G3 Histologic grading system: 3 grade system - Pathologic stage from 06/26/2017: Stage IB (pT1c, pN0, cM0, G3, ER-, PR-, HER2-) - Signed by [name], MD on 07/10/2017 Neoadjuvant therapy: No Nuclear grade: G3 Multigene prognostic tests performed: None Histologic grading system: 3 grade system Laterality: Left     Malignant neoplasm of lower-outer quadrant of left breast of female, estrogen receptor negative ([place])  05/30/2017 Mammogram   Diagnostic mammo and US IMPRESSION: 1. Highly suspicious 1.4 cm mass in the slightly lower slightly outer left breast -tissue sampling recommended. 2. Indeterminate 0.5 mm mass in the slightly lower slightly outer left breast -tissue sampling recommended. 3. At least 2 left axillary lymph nodes with borderline cortical thickness.   06/06/2017 Initial Biopsy   Diagnosis 1. Breast, left, needle core biopsy, 5:30 o'clock - INVASIVE DUCTAL CARCINOMA, G3 2. Lymph node, needle/core biopsy, left axillary - NO CARCINOMA IDENTIFIED IN ONE LYMPH NODE (0/1)   06/06/2017 Initial Diagnosis   Malignant neoplasm of lower-outer quadrant of left breast of female, estrogen receptor negative ([place])   06/06/2017 Receptors her2   Estrogen Receptor: 0%, NEGATIVE Progesterone Receptor: 0%, NEGATIVE Proliferation Marker Ki67: 70%   06/26/2017 Surgery   LEFT BREAST LUMPECTOMY WITH RADIOACTIVE SEED AND SENTINEL LYMPH NODE BIOPSY ERAS  PATHWAY AND INSERTION PORT-A-CATH By Dr. Barry Dienes on 06/26/17     06/26/2017 Pathology Results   Diagnosis 06/26/17  1. Breast, lumpectomy, Left - INVASIVE DUCTAL CARCINOMA, GRADE III/III, SPANNING 1.2 CM. - THE SURGICAL RESECTION MARGINS ARE NEGATIVE FOR CARCINOMA. - SEE ONCOLOGY TABLE BELOW. 2. Lymph node, sentinel, biopsy, Left axillary #1 - THERE IS NO EVIDENCE OF CARCINOMA IN 1 OF 1 LYMPH NODE (0/1). 3. Lymph node, sentinel, biopsy, Left axillary #2 - THERE IS NO EVIDENCE OF CARCINOMA IN 1 OF 1 LYMPH NODE (0/1). 4. Lymph node, sentinel, biopsy, Left axillary #3 - THERE IS NO EVIDENCE OF CARCINOMA IN 1 OF 1 LYMPH NODE (0/1).    07/25/2017 - 09/29/2017 Chemotherapy   Adjuvant cytoxan and docetaxel (TC) every 3 weekd for 4 cycles     11/19/2017 - 12/17/2017 Radiation Therapy   Adjuvant breast radiation Left breast treated to 42.5 Gy with 17 fx of 2.5 Gy followed by a boost of 7.5 Gy with 3 fx of 2.5 Gy      02/2019 Procedure   She had PAC removal in 02/2019.    07/23/2020 Imaging   MRI Lumbar Spine 07/23/20 IMPRESSION: 1. No significant disc herniation, spinal canal or neural foraminal stenosis at any level. 2. Moderate facet degenerative changes at L3-4, L4-5 and L5-S1. 3. Multiple enlarged retroperitoneal and right iliac lymph nodes. Recommend correlation with CT of the abdomen and pelvis with contrast.   08/15/2020 Imaging   CT AP 08/15/20  IMPRESSION: 1. Right iliac and periaortic adenopathy is noted concerning for metastatic disease or lymphoma. Also noted is abnormal soft tissue mass anterior and posterior to the right iliac wing concerning for malignancy or metastatic disease. MRI is recommended for further evaluation. 2. Irregular lucency with sclerotic margins is seen involving the right  iliac crest concerning for possible lytic lesion. MRI may be performed for further evaluation. 3. Enlarged fibroid uterus. 4. Small gallstone. 5. Aortic atherosclerosis.   08/22/2020 Pathology Results   FINAL MICROSCOPIC DIAGNOSIS:  08/22/20  A. NEEDLE CORE, RIGHT GLUTEUS MINIMUMUS, BIOPSY:  -  Metastatic carcinoma  -  See comment   COMMENT:  By immunohistochemistry, the neoplastic cells are positive for  cytokeratin-7 and have weak patchy positivity for GATA3 but are negative for cytokeratin-20 and GCDFP.  The combined morphology and immunophenotype, support metastasis from the patient's known breast primary carcinoma.   ADDENDUM:  PROGNOSTIC INDICATOR RESULTS:  The tumor cells are NEGATIVE for Her2 (0).  Estrogen Receptor:       POSITIVE, 20%, WEAK STAINING  Progesterone Receptor:   NEGATIVE   PD-L1 (0) negative    09/04/2020 - 09/22/2020 Radiation Therapy   Palliative radiation to right iliac 09/04/20 - 09/22/20, Dr. Lisbeth Renshaw   09/26/2020 -  Chemotherapy   first line weekly Taxol 3 weeks on/1 week off starting 09/26/20 --Reduced to 2 weeks on/1 week off starting with C3 due to neuropathy. Stopped after C4 on 12/18/20 due to neuratrophy.  --Switched to Xeloda on 01/01/21 at 1553m in the AM and 20062min the PM for 2 weeks on/1 week off     10/13/2020 Procedure    PAC placement on 10/13/20.    04/10/2021 Imaging   CT CAP  IMPRESSION: 1. Enlarging abdominopelvic retroperitoneal and right inguinal adenopathy, compatible with disease progression. 2. Lytic metastasis in the right iliac wing, similar. 3. Hepatomegaly. 4. Cholelithiasis. 5. Enlarged fibroid uterus. 6.  Aortic atherosclerosis (ICD10-I70.0).   06/18/2021 Imaging   CT CAP  IMPRESSION: 1. Today's study demonstrates progression of metastatic disease as evidence by increased number and size of numerous enlarged lymph nodes in the retroperitoneum, along the right pelvic sidewall, and in the upper right thigh. 2. Osseous metastases in the bony pelvis appear similar to the prior examination. No definite new osseous lesions are otherwise noted. 3. Fibroid uterus again noted. 4. Cholelithiasis without evidence of acute cholecystitis. 5. Aortic atherosclerosis,  in addition to left anterior descending coronary artery disease. Please note that although the presence of coronary artery calcium documents the presence of coronary artery disease, the severity of this disease and any potential stenosis cannot be assessed on this non-gated CT examination. Assessment for potential risk factor modification, dietary therapy or pharmacologic therapy may be warranted, if clinically indicated. 6. Additional incidental findings, as above.   06/20/2021 Imaging   Bone Scan  IMPRESSION: No osseous uptake suspicious for recurrent/progressive metastatic disease.   07/03/2021 - 01/11/2022 Chemotherapy   Patient is on Treatment Plan : BREAST METASTATIC Sacituzumab govitecan-hziy (TIvette Loyalq21d     09/27/2021 Imaging   EXAM: CT CHEST, ABDOMEN, AND PELVIS WITH CONTRAST  IMPRESSION: 1. Marked interval improvement in previously demonstrated lymphadenopathy in the abdomen and pelvis. A few prominent right inguinal lymph nodes remain. 2. Stable osseous metastatic disease in the pelvis. 3. No evidence of local recurrence or other metastatic disease. 4. Stable incidental findings including cholelithiasis, uterine fibroids and Aortic Atherosclerosis (ICD10-I70.0).  ADDENDUM: Not mentioned in the original impression is a possible nonocclusive fibrin sheath along the distal aspect of the Port-A-Cath.   09/27/2021 Imaging   EXAM: NUCLEAR MEDICINE WHOLE BODY BONE SCAN  IMPRESSION: Stable examination, without abnormal osseous uptake.   11/27/2021 Imaging   CT AP IMPRESSION: 1. Stable lytic and sclerotic lesion in the RIGHT iliac wing. 2. No evidence of visceral metastasis or  adenopathy in the abdomenpelvis. 3. Leiomyomatous uterus.  ADDENDUM REPORT: 11/29/2021 09:33 Upon further review and discussion with Cira Rue NP, the RIGHT inguinal lymph nodes previous described have increased in volume. For example 14 mm node (image 91/2) is increased from 9 mm. Adjacent 14  mm node on image 86 is increased from 8 mm. Additionally, LEFT common iliac lymph node is also slightly increased measuring 10 mm (46/2) compared to 7 mm on comparison exam from 09/27/2021. These sites demonstrates similar but greater lymphadenopathy on CT 06/18/2021.   02/15/2022 -  Chemotherapy   Patient is on Treatment Plan : BREAST METASTATIC fam-trastuzumab deruxtecan-nxki (Enhertu) q21d       CURRENT THERAPY: Enhertu, q3weeks, starting 02/15/22  INTERVAL HISTORY: Rafaelita Foister Critz 66 y.o. female returns to the clinic today for a 1 week follow-up visit.  The patient was last seen in the clinic on 02/15/2022 by Dr. Burr Medico.  The patient recently was found to have disease progression and she started treatment with Enhertu last week.  She also completed palliative radiation by Dr. Lisbeth Renshaw ~6/30.  She is reporting that her right inguinal lymph node still seem enlarged and swollen. Since undergoing her treatment last week, she reports nausea/vomiting, weight loss, and constipation.   Regarding her nausea and vomiting, the patient received premedications with Decadron and Aloxi.  The patient has outpatient prescriptions for Zofran and Compazine for which she stated she was using for the first few days following treatment (she is aware to start zofran starting on day 3 as needed).  She reports that she was alternating these as prescribed and had some episodes of emesis the days following treatment.  She also was endorsing constipation around this time.  She has a history of frequent constipation for which she is prescribed Linzess.  She does not take stool softeners or laxatives since it reportedly does not work for her.  She finally had a bowel movement this morning of normal color, consistency, and caliber so she is feeling better in that regard.  She denies any abdominal pain.  Because of the constipation and nausea and vomiting, the patient lost weight.  The patient does not drink supplemental drinks since it  contains canola oil which does not agree with her.  She has diabetes, of note.  She has not been making any protein drinks but is going to consider doing that moving forward considering she lost about 10 pounds.  She has been drinking Pedialyte.  She is not interested in seeing a member the nutritionist team at this time.  She also reports that she had some back discomfort following treatment.  She had been taking ibuprofen and oxycodone which did improve her pain and she was able to sleep.  She denies any pain at this time.  She denies any hematuria or dysuria.  The patient was supposed to have a restaging CT scan last week to establish new baseline.  She not have this performed since she was not feeling well.  Overall today, she does feel like she "turned the corner" and her symptoms are moving in the right direction.  She denies any fever or chills.  She reports that she has night sweats "all the time".  She denies any chest pain, shortness of breath, cough, or hemoptysis.  Denies any headache or visual changes.  She is here today for evaluation and repeat blood work and a toxicity check.   MEDICAL HISTORY: Past Medical History:  Diagnosis Date   Abnormal x-ray of lungs with  single pulmonary nodule 03/15/2016   per records from [place]    Anemia    Asthma    Cholelithiasis 05/19/2017   On CT   Coronary artery calcification seen on CAT scan 05/19/2017   Diabetes mellitus without complication ([place])    type 2   Diabetes mellitus, new onset ([place]) 01/20/2019   Dilated idiopathic cardiomyopathy ([place]) 12/2015   EF 15-20%. Diagnosed in [facility]   Headache    NONE RECENT   Hypertension    Lymph nodes enlarged 07/25/2020   Malignant neoplasm of lower-outer quadrant of left breast of female, estrogen receptor negative ([place]) 06/11/2017   left breast   Mixed hyperlipidemia 06/17/2018   partially blind    Personal history of chemotherapy 2018-2019   Personal history of radiation  therapy 2019   Retinitis pigmentosa    Retroperitoneal lymphadenopathy 07/25/2020   Uveitis     ALLERGIES:  is allergic to morphine and related, latex, tylenol with codeine #3 [acetaminophen-codeine], and penicillins.  MEDICATIONS:  Current Outpatient Medications  Medication Sig Dispense Refill   Accu-Chek FastClix Lancets MISC TEST TWICE A DAY. PT USES AN ACCU-CHEK GUIDE ME METER 102 each 2   amLODipine (NORVASC) 5 MG tablet Take 1 tablet (5 mg total) by mouth daily. 30 tablet 3   aspirin 81 MG chewable tablet Chew 81 mg by mouth daily.     atorvastatin (LIPITOR) 20 MG tablet Take 1 tablet (20 mg total) by mouth daily. 90 tablet 3   brimonidine (ALPHAGAN) 0.2 % ophthalmic solution Place 1 drop into both eyes 2 (two) times daily.     carvedilol (COREG) 12.5 MG tablet Take 1 tablet (12.5 mg total) by mouth 2 (two) times daily with a meal. 180 tablet 1   cholecalciferol (VITAMIN D3) 25 MCG (1000 UNIT) tablet Take 2,000 Units by mouth daily.     Continuous Blood Gluc Receiver (DEXCOM G7 RECEIVER) DEVI 1 Device by Does not apply route 2 (two) times daily. 1 each 1   Continuous Blood Gluc Sensor (DEXCOM G7 SENSOR) MISC Use as directed, replace every 10 DAYS 3 each 10   furosemide (LASIX) 20 MG tablet TAKE 2 TABLETS (40 MG TOTAL) BY MOUTH AS NEEDED. FOR WEIGHT GAIN 3 TO 4 LBS. 90 tablet 3   HUMALOG KWIKPEN 100 UNIT/ML KwikPen Inject 3-10 Units into the skin 3 (three) times daily before meals. Per sliding scale     ibuprofen (ADVIL) 800 MG tablet Take 1 tablet (800 mg total) by mouth every 8 (eight) hours as needed. 30 tablet 2   insulin glargine (LANTUS) 100 UNIT/ML injection Inject 25 Units into the skin at bedtime. 15 units in am  on chemo week     Lancets Misc. (ACCU-CHEK SOFTCLIX LANCET DEV) KIT 1 Units by Does not apply route 2 (two) times daily. Check FSBS BID - Include strips # 50 with 5 refills, Lancets #50 with 5 refills DX: Type 2 DM - ICD 10: E11.9 1 kit 0   lidocaine-prilocaine (EMLA)  cream Apply 1 application topically as needed. 30 g 0   linaclotide (LINZESS) 72 MCG capsule Take 72 mcg by mouth as needed.     metFORMIN (GLUCOPHAGE) 1000 MG tablet Take 1 tablet (1,000 mg total) by mouth 2 (two) times daily with a meal. 180 tablet 3   Multiple Vitamins-Minerals (WOMENS MULTI PO) Take 15 mLs by mouth daily.     ondansetron (ZOFRAN) 8 MG tablet Take 1 tablet (8 mg total) by mouth every 8 (eight) hours as  needed for nausea or vomiting (begin on day 3 after chemo). 30 tablet 1   oxyCODONE (OXY IR/ROXICODONE) 5 MG immediate release tablet Take 1 tablet (5 mg total) by mouth every 8 (eight) hours as needed for severe pain. 60 tablet 0   oxyCODONE-acetaminophen (PERCOCET/ROXICET) 5-325 MG tablet Take 1 tablet by mouth every 6 (six) hours as needed for severe pain.     pantoprazole (PROTONIX) 40 MG tablet TAKE 1 TABLET BY MOUTH EVERY DAY 90 tablet 1   prochlorperazine (COMPAZINE) 10 MG tablet Take 1 tablet (10 mg total) by mouth every 6 (six) hours as needed (Nausea or vomiting). 30 tablet 1   sacubitril-valsartan (ENTRESTO) 97-103 MG Take 1 tablet by mouth 2 (two) times daily. 180 tablet 3   Semaglutide (RYBELSUS) 7 MG TABS Take 7 mg by mouth daily. 90 tablet 3   spironolactone (ALDACTONE) 25 MG tablet Take 25 mg by mouth daily.     vitamin B-12 (CYANOCOBALAMIN) 1000 MCG tablet Take 1,000 mcg by mouth daily.     vitamin C (ASCORBIC ACID) 500 MG tablet Take 500 mg by mouth in the morning, at noon, in the evening, and at bedtime.     zinc gluconate 50 MG tablet Take 50 mg by mouth daily.     Current Facility-Administered Medications  Medication Dose Route Frequency Provider Last Rate Last Admin   insulin glulisine (APIDRA) injection 6 Units  6 Units Subcutaneous Once [name]        SURGICAL HISTORY:  Past Surgical History:  Procedure Laterality Date   brain cyst removed  2007   to help with headaches per patient , aspirated    BREAST BIOPSY Left 06/06/2017   x2    BREAST CYST ASPIRATION Left 06/06/2017   BREAST LUMPECTOMY Left 06/26/2017   BREAST LUMPECTOMY WITH RADIOACTIVE SEED AND SENTINEL LYMPH NODE BIOPSY Left 06/26/2017   Procedure: BREAST LUMPECTOMY WITH RADIOACTIVE SEED AND SENTINEL LYMPH NODE BIOPSY ERAS  PATHWAY;  Surgeon: [name], MD;  Location: [facility];  Service: General;  Laterality: Left;  pec block   FINE NEEDLE ASPIRATION Left 02/23/2019   Procedure: FINE NEEDLE ASPIRATION;  Surgeon: [name], MD;  Location: [facility];  Service: General;  Laterality: Left;  Asiration of left breast seroma    IR IMAGING GUIDED PORT INSERTION  10/13/2020   NASAL TURBINATE REDUCTION     PORT-A-CATH REMOVAL Right 02/23/2019   Procedure: REMOVAL PORT-A-CATH;  Surgeon: [name], MD;  Location: [facility];  Service: General;  Laterality: Right;   PORTACATH PLACEMENT N/A 06/26/2017   Procedure: INSERTION PORT-A-CATH;  Surgeon: [name], MD;  Location: [place] [facility];  Service: General;  Laterality: N/A;   TRANSTHORACIC ECHOCARDIOGRAM  12/2015   A) [facility] [place] May 2017: Mild concentric LVH. Global hypokinesis. GR daily. EF 18% severe LA dilation. Mitral annular dilatation with papillary muscle dysfunction and moderate MR. Dilated IVC consistent with elevated RAP.   TRANSTHORACIC ECHOCARDIOGRAM  05/2017; 08/2017   a) Mild concentric LVH.  EF 45 to 50% with diffuse hypokinesis.  GR 1 DD.  Mild aortic root dilation.;; b)  Mild LVH EF 45 to 50%.  Diffuse HK.  No significant valve disease.  No significant change.    REVIEW OF SYSTEMS:   Review of Systems  Constitutional: Positive for fatigue, decreased appetite, weight loss.  Negative for chills and fever.  HENT: Negative for mouth sores, nosebleeds, sore throat and trouble swallowing.   Eyes: She is legally blind. Negative  for icterus.  Respiratory: Positive for dyspnea on exertion.  Negative for cough, hemoptysis,  and wheezing.   Cardiovascular: Negative for chest  pain and leg swelling.  Gastrointestinal: Today for constipation, nausea, vomiting (improved at this time). Negative for abdominal pain and diarrhea. Genitourinary: Negative for bladder incontinence, difficulty urinating, dysuria, frequency and hematuria.   Musculoskeletal:  Positive for back pain (resolved at this time). Negative for back pain, gait problem, neck pain and neck stiffness.  Skin: Negative for itching and rash.  Neurological: Negative for dizziness, extremity weakness, gait problem, headaches, light-headedness and seizures.  Hematological: Negative for adenopathy. Does not bruise/bleed easily.  Psychiatric/Behavioral: Negative for confusion, depression and sleep disturbance. The patient is not nervous/anxious.     PHYSICAL EXAMINATION:  Blood pressure (!) 144/93, pulse 87, temperature 98.3 F (36.8 C), temperature source Oral, resp. rate 17, height '5\' 7"'  (1.702 m), weight 213 lb 1.6 oz (96.7 kg), SpO2 98 %.  ECOG PERFORMANCE STATUS: 1  Physical Exam  Constitutional: Oriented to person, place, and time and well-developed, well-nourished, and in no distress.  HENT:  Head: Normocephalic and atraumatic.  Mouth/Throat: Oropharynx is clear and moist. No oropharyngeal exudate.  Eyes: The patient is legally blind.  Conjunctivae are normal. Right eye exhibits no discharge. Left eye exhibits no discharge. No scleral icterus.  Neck: Normal range of motion. Neck supple.  Cardiovascular: Normal rate, regular rhythm, normal heart sounds and intact distal pulses.   Pulmonary/Chest: Effort normal and breath sounds normal. No respiratory distress. No wheezes. No rales.  Abdominal: Soft. Bowel sounds are normal. Exhibits no distension and no mass. There is no tenderness.  Musculoskeletal: Normal range of motion. Exhibits no edema.  Lymphadenopathy:  Right inguinal adenopathy. Neurological: Alert and oriented to person, place, and time. Exhibits normal muscle tone.  Uses a cane due to her  blindness. Skin: Skin is warm and dry. No rash noted. Not diaphoretic. No erythema. No pallor.  Psychiatric: Mood, memory and judgment normal.  Vitals reviewed.  LABORATORY DATA: Lab Results  Component Value Date   WBC 4.5 02/21/2022   HGB 9.8 (L) 02/21/2022   HCT 29.8 (L) 02/21/2022   MCV 87.4 02/21/2022   PLT 199 02/21/2022      Chemistry      Component Value Date/Time   NA 135 02/21/2022 0819   NA 138 12/17/2018 0900   NA 138 08/01/2017 0802   K 3.8 02/21/2022 0819   K 4.2 08/01/2017 0802   CL 106 02/21/2022 0819   CO2 24 02/21/2022 0819   CO2 21 (L) 08/01/2017 0802   BUN 13 02/21/2022 0819   BUN 7 (L) 12/17/2018 0900   BUN 14.5 08/01/2017 0802   CREATININE 0.72 02/21/2022 0819   CREATININE 0.81 05/07/2021 0912   CREATININE 0.9 08/01/2017 0802      Component Value Date/Time   CALCIUM 9.2 02/21/2022 0819   CALCIUM 10.6 (H) 08/01/2017 0802   ALKPHOS 106 02/21/2022 0819   ALKPHOS 136 08/01/2017 0802   AST 29 02/21/2022 0819   AST 18 05/07/2021 0912   AST 37 (H) 08/01/2017 0802   ALT 23 02/21/2022 0819   ALT 17 05/07/2021 0912   ALT 40 08/01/2017 0802   BILITOT 0.5 02/21/2022 0819   BILITOT 0.7 05/07/2021 0912   BILITOT 0.60 08/01/2017 0802       RADIOGRAPHIC STUDIES:  VAS [place] LOWER EXTREMITY VENOUS (DVT)  Result Date: 02/06/2022  Lower Venous DVT Study Patient Name:  Berma ANN Sampley  Date of Exam:  02/06/2022 Medical Rec #: [id number]            Accession #:    [id number] Date of Birth: 1956-03-21           Patient Gender: F Patient Age:   30 years Exam [facility]  [facility] Procedure:      VAS [place] LOWER EXTREMITY VENOUS (DVT) Referring Phys: Quillian Quince [name] --------------------------------------------------------------------------------  Indications: Edema.  Risk Factors: Cancer Breast primary, multiple metastatic sites. Comparison Study: No prior study Performing Technologist: Maudry Mayhew MHA, RDMS, RVT, RDCS  Examination Guidelines: A  complete evaluation includes B-mode imaging, spectral Doppler, color Doppler, and power Doppler as needed of all accessible portions of each vessel. Bilateral testing is considered an integral part of a complete examination. Limited examinations for reoccurring indications may be performed as noted. The reflux portion of the exam is performed with the patient in reverse Trendelenburg.  +---------+---------------+---------+-----------+----------+--------------+ RIGHT    CompressibilityPhasicitySpontaneityPropertiesThrombus Aging +---------+---------------+---------+-----------+----------+--------------+ CFV      Full           Yes      Yes                                 +---------+---------------+---------+-----------+----------+--------------+ SFJ      Full                                                        +---------+---------------+---------+-----------+----------+--------------+ FV Prox  Full                                                        +---------+---------------+---------+-----------+----------+--------------+ FV Mid   Full                                                        +---------+---------------+---------+-----------+----------+--------------+ FV DistalFull                                                        +---------+---------------+---------+-----------+----------+--------------+ PFV      Full                                                        +---------+---------------+---------+-----------+----------+--------------+ POP      Full           Yes      Yes                                 +---------+---------------+---------+-----------+----------+--------------+ PTV      Full                                                        +---------+---------------+---------+-----------+----------+--------------+  PERO     Full                                                         +---------+---------------+---------+-----------+----------+--------------+   +----+---------------+---------+-----------+----------+--------------+ LEFTCompressibilityPhasicitySpontaneityPropertiesThrombus Aging +----+---------------+---------+-----------+----------+--------------+ CFV Full           Yes      Yes                                 +----+---------------+---------+-----------+----------+--------------+     Summary: RIGHT: - There is no evidence of deep vein thrombosis in the lower extremity.  - No cystic structure found in the popliteal fossa. - Ultrasound characteristics of atypical, enlarged lymph nodes are noted in the groin.  LEFT: - No evidence of common femoral vein obstruction.  *See table(s) above for measurements and observations. Electronically signed by Orlie Pollen on 02/06/2022 at 5:37:51 PM.    Final    ECHOCARDIOGRAM COMPLETE  Result Date: 01/31/2022    ECHOCARDIOGRAM REPORT   Patient Name:   Trystin ANN Canova Date of Exam: 01/31/2022 Medical Rec #:  [id number]           Height:       67.2 in Accession #:    [id number]          Weight:       214.0 lb Date of Birth:  [birth year]          BSA:          2.087 m Patient Age:    72 years            BP:           142/92 mmHg Patient Gender: F                   HR:           88 bpm. Exam Location:  Outpatient Procedure: 2D Echo, 3D Echo, Cardiac Doppler, Color Doppler and Strain Analysis Indications:    Chemo Z09  History:        Patient has prior history of Echocardiogram examinations, most                 recent 08/26/2017. Cardiomyopathy and CHF, COPD; Risk                 Factors:Hypertension, Dyslipidemia and Former Smoker. History of                 radiation therapy. Breast cancer.  Sonographer:    Darlina Sicilian RDCS Referring Phys: [id number] [place]  1. Left ventricular ejection fraction, by estimation, is 35 to 40%. Left ventricular ejection fraction by 3D volume is 38 %. The left ventricle has moderately  decreased function. The left ventricle demonstrates global hypokinesis. Left ventricular diastolic parameters are indeterminate. The average left ventricular global longitudinal strain is -12.9 %. The global longitudinal strain is abnormal.  2. Right ventricular systolic function is mildly reduced. The right ventricular size is normal. There is normal pulmonary artery systolic pressure. The estimated right ventricular systolic pressure is 30.8 mmHg.  3. The mitral valve is abnormal. Mild mitral valve regurgitation.  4. The aortic valve is tricuspid. Aortic valve regurgitation is trivial.  5. Aortic dilatation noted. There is mild  dilatation of the aortic root, measuring 42 mm.  6. The inferior vena cava is normal in size with greater than 50% respiratory variability, suggesting right atrial pressure of 3 mmHg. Comparison(s): Changes from prior study are noted. 08/26/2017: LVEF 45-50%. FINDINGS  Left Ventricle: Left ventricular ejection fraction, by estimation, is 35 to 40%. Left ventricular ejection fraction by 3D volume is 38 %. The left ventricle has moderately decreased function. The left ventricle demonstrates global hypokinesis. The average left ventricular global longitudinal strain is -12.9 %. The global longitudinal strain is abnormal. The left ventricular internal cavity size was normal in size. There is no left ventricular hypertrophy. Left ventricular diastolic parameters are indeterminate. Right Ventricle: The right ventricular size is normal. No increase in right ventricular wall thickness. Right ventricular systolic function is mildly reduced. There is normal pulmonary artery systolic pressure. The tricuspid regurgitant velocity is 2.23 m/s, and with an assumed right atrial pressure of 3 mmHg, the estimated right ventricular systolic pressure is 09.7 mmHg. Left Atrium: Left atrial size was normal in size. Right Atrium: Right atrial size was normal in size. Pericardium: There is no evidence of  pericardial effusion. Mitral Valve: The mitral valve is abnormal. There is mild thickening of the anterior and posterior mitral valve leaflet(s). Mild mitral valve regurgitation. Tricuspid Valve: The tricuspid valve is grossly normal. Tricuspid valve regurgitation is trivial. Aortic Valve: The aortic valve is tricuspid. Aortic valve regurgitation is trivial. Pulmonic Valve: The pulmonic valve was normal in structure. Pulmonic valve regurgitation is not visualized. Aorta: Aortic dilatation noted. There is mild dilatation of the aortic root, measuring 42 mm. Venous: The inferior vena cava is normal in size with greater than 50% respiratory variability, suggesting right atrial pressure of 3 mmHg. IAS/Shunts: No atrial level shunt detected by color flow Doppler.  LEFT VENTRICLE PLAX 2D LVIDd:         5.50 cm LVIDs:         3.80 cm         2D LV PW:         0.90 cm         Longitudinal LV IVS:        1.10 cm         Strain LVOT diam:     2.30 cm         2D Strain GLS  -12.3 % LV SV:         57              (A2C): LV SV Index:   27              2D Strain GLS  -14.6 % LVOT Area:     4.15 cm        (A3C):                                2D Strain GLS  -11.8 %                                (A4C): LV Volumes (MOD)               2D Strain GLS  -12.9 % LV vol d, MOD    139.0 ml      Avg: A2C: LV vol d, MOD    171.0 ml      3D Volume EF A4C:  LV 3D EF:    Left LV vol s, MOD    92.6 ml                    ventricul A2C:                                        ar LV vol s, MOD    103.0 ml                   ejection A4C:                                        fraction LV SV MOD A2C:   46.4 ml                    by 3D LV SV MOD A4C:   171.0 ml                   volume is LV SV MOD BP:    58.1 ml                    38 %.                                 3D Volume EF:                                3D EF:        38 %                                LV EDV:       149 ml                                LV ESV:        93 ml                                LV SV:        56 ml RIGHT VENTRICLE RV S prime:     9.79 cm/s TAPSE (M-mode): 1.3 cm LEFT ATRIUM             Index        RIGHT ATRIUM           Index LA diam:        4.30 cm 2.06 cm/m   RA Area:     14.80 cm LA Vol (A2C):   53.7 ml 25.74 ml/m  RA Volume:   36.50 ml  17.49 ml/m LA Vol (A4C):   45.4 ml 21.76 ml/m LA Biplane Vol: 51.2 ml 24.54 ml/m  AORTIC VALVE LVOT Vmax:   71.70 cm/s LVOT Vmean:  48.200 cm/s LVOT VTI:    0.136 m  AORTA Ao Root diam: 4.20 cm Ao Asc diam:  3.65 cm MR Peak grad:    100.8 mmHg   TRICUSPID VALVE MR Mean grad:    59.0 mmHg  TR Peak grad:   19.9 mmHg MR Vmax:         502.00 cm/s  TR Vmax:        223.00 cm/s MR Vmean:        360.0 cm/s MR PISA:         0.57 cm     SHUNTS MR PISA Eff ROA: 4 mm        Systemic VTI:  0.14 m MR PISA Radius:  0.30 cm      Systemic Diam: 2.30 cm Lyman Bishop MD Electronically signed by Lyman Bishop MD Signature Date/Time: 01/31/2022/2:13:59 PM    Final      ASSESSMENT/PLAN:  Lavaeh Bau Marcoe is a 66 y.o. female with    1. Left breast invasive ductal carcinoma, stage IB, p(T1cN0M0), Triple negative, Grade 3, in 2018, metastatic disease in 08/2020 ER 20% weakly + PR/HER2 (0) negative, PD-L1 0%, genetics (-), HER2 1+ on node biopsy  -diagnosed with triple negative breast cancer in 05/2017. She is s/p left breast lumpectomy, adjuvant chemo TC and Radiation.  -Genetic testing was negative for pathogenetic mutations.  -Due to new right gluteal pain, she underwent biopsy on 09/02/20 which showed metastatic carcinoma, consistent with breast primary. ER 20% positive, PR and HER2 negative.  -she received 4 cycles of Taxol 09/26/20 - 12/18/20, discontinued due to worsening neuropathy. She then took Xeloda from 01/01/21 - 06/2021. -she switched to [facility] 07/03/21 - 01/11/22 due to disease progression. Dose reduced due to nausea and diarrhea. Chemo held for one dose in 09/2021 due to uncontrolled hyperglycemia.   -restaging CT CAP on 11/27/21 showed: stable right iliac wing lesion; increase in volume of inguinal lymph nodes, overall similar from prior and improved from 06/2021. -her CA 27.29 initially responded well to treatment but has been rising, up to 62.1 on 01/11/22. -biopsy of a right inguinal lymph node on 01/15/22 showed adenocarcinoma, consistent with metastatic breast carcinoma. Prognostic panel confirmed triple negative disease (Her2 1+ low expression).  -she received palliative radiation to the inguinal nodes under Dr. Lisbeth Renshaw 01/28/22 - ~02/08/22.  -Therefore, Dr. Burr Medico started the patient on treatment with Enhertu. She is status post her first cycle of treatment last week on 02/15/22. She had some side effects from treatment (see #2 below).  Overall, the patient is nontoxic appearing today. -She was supposed to have a restaging CT scan last week to establish a new baseline.  She was given the number to centralized scheduling instructed to call at her earliest convenience to schedule this.  This will also help [place] evaluate her inguinal nodes and her back discomfort which has improved at this time. -Labs were reviewed.  Stable. -F/U in two weeks before starting cycle #2.  Will route my note to Dr. Burr Medico so she is aware of side effects with cycle #1.   2.  Treatment related side effects (nausea vomiting, constipation, weight loss, back pain) -The patient reported nausea and vomiting following treatment.  She was taking her Zofran and Compazine as prescribed and alternating (Zofran starting 72 hours after infusion).  Despite this, she reported nausea and vomiting several days following treatment which is improved at this time -Reach out to pharmacy who recommended Emend.  Will adjust and care plan and route note to Dr. Burr Medico to ensure she is in agreement -The patient has a history of chronic constipation for which she is prescribed Linzess by GI. -Reviewed constipation education with the patient including fruits  and vegetables, being as active as possible,  drinking plenty of fluid.  I also discussed that moving forward to help prevent constipation it may be helpful to start a stool softener leading up to her next cycle of treatment and to take laxatives as needed for acute constipation.  -She had approximately 10 pound weight loss per chart review.  She declined referral to nutritionist.  She declined supplemental drinks so commercially due to intolerance to ingredients (canola oil).  Encouraged her to make her own protein drinks. -She is already taking her antiemetics approximately 30 minutes before eating meals -She reported some back pain following treatment which improved with oxycodone and ibuprofen and she was able to sleep. She no longer has back pain at this time.  Once again encouraged her to schedule her restaging scan to make sure no metastatic lesions in her spine.  Any associated hematuria or dysuria.  3. Chronic combined systolic and diastolic heart failure -due to concerns for reversible cardiotoxicity with Enhertu, she was evaluated by Dr. Haroldine Laws. Echo on 01/31/22 showed EF 35-40%, GLS -12.9%, RV mildly reduced.  -will monitor with repeat echo 04/01/22   4. Diabetes and uncontrolled hyperglycemia -worsening hyperglycemia lately, probably related to dexa (before chemo) and Trodelvy  -her hyperglycemia is now better controlled on 25u Lantus and sliding scale insulin. --The patient is not a good candidate for Decadron for her nausea and vomiting due to her diabetes   5. Bone metastases, Vit D deficiency -She had radiating right hip pain since 2020 from right iliac wing mass -She completed Palliative radiation to right iliac 09/04/20 - 09/22/20 with Dr. Lisbeth Renshaw, and her pain is much improved.  -Given localized bone met, she started Zometa q52month on 11/20/20. Most recent dose 11/08/21 -vit D has remained low, most recently 20.86 on 08/17/21.   6. G2 peripheral Neuropathy  -S/p C2D8 Taxol she  developed mild tingling in her hands and feet. Taxol stopped after 12/18/20. Her neuropathy is improving.  -Overall stable   7. DM, HTN, COPD, retinitis pigmentosa with limited vision -Continue to follow-up with PCP Dr LRedmond Schooland cardiologist Dr HEllyn Hack -she is legally blind -Blood pressure 144/93 today. -The patient is not a good candidate for Decadron for her nausea and vomiting due to her diabetes   8. Goal of care discussion  -The patient understands the goal of care is palliative. -she is full code now  -Provided her with constipation education -We will reach out to pharmacy to assess if the recommend premedication adjustment to help control her nausea and vomiting days following treatment. She is not a good candidate for decadron because of her diabetes.  Pharmacy recommended adding Emend.  Will route changes to Dr. FBurr Medicoto ensure she is in agreement. -Encouraged to make her own protein drinks that she does not like what is sold commercially -Gave the patient the number to the centralized scheduling and instructed her to schedule her restaging CT scan at her earliest convenience. -Follow-up in 2 weeks with Dr. FBurr Medicobefore starting cycle #2        No orders of the defined types were placed in this encounter.    The total time spent in the appointment was 30-39 minutes.   Marnie Fazzino L Ellyson Rarick, PA-C 02/21/22

## 2022-02-19 ENCOUNTER — Encounter (HOSPITAL_COMMUNITY): Payer: Self-pay

## 2022-02-19 ENCOUNTER — Ambulatory Visit (HOSPITAL_COMMUNITY): Payer: Medicare Other

## 2022-02-19 ENCOUNTER — Telehealth: Payer: Self-pay | Admitting: *Deleted

## 2022-02-19 NOTE — Telephone Encounter (Signed)
Called pt to see how she was doing post new treatment.  She reports feeling "miserable" starting Sun.  She reports mid back aches & ibuprofen helped.  She had diarrhea which is gone now but has been drinking pedialyte which has seemed to help. She has had some nausea/vomiting & used zofran.  Message routed to Dr Feng/Pod RN.

## 2022-02-19 NOTE — Telephone Encounter (Signed)
-----   Message from Tildon Husky, RN sent at 02/15/2022  3:23 PM EDT ----- Regarding: first time treatment call back- Kristin Ward Patient received Enhertu today for the first time. She is seen by Dr. Burr Medico. No issues with treatment patient tolerated well.

## 2022-02-21 ENCOUNTER — Other Ambulatory Visit: Payer: Self-pay

## 2022-02-21 ENCOUNTER — Inpatient Hospital Stay (HOSPITAL_BASED_OUTPATIENT_CLINIC_OR_DEPARTMENT_OTHER): Payer: Medicare Other | Admitting: Physician Assistant

## 2022-02-21 ENCOUNTER — Inpatient Hospital Stay: Payer: Medicare Other

## 2022-02-21 VITALS — BP 144/93 | HR 87 | Temp 98.3°F | Resp 17 | Ht 67.0 in | Wt 213.1 lb

## 2022-02-21 DIAGNOSIS — J449 Chronic obstructive pulmonary disease, unspecified: Secondary | ICD-10-CM | POA: Diagnosis not present

## 2022-02-21 DIAGNOSIS — Z5112 Encounter for antineoplastic immunotherapy: Secondary | ICD-10-CM | POA: Diagnosis not present

## 2022-02-21 DIAGNOSIS — E1165 Type 2 diabetes mellitus with hyperglycemia: Secondary | ICD-10-CM | POA: Diagnosis not present

## 2022-02-21 DIAGNOSIS — E559 Vitamin D deficiency, unspecified: Secondary | ICD-10-CM | POA: Diagnosis not present

## 2022-02-21 DIAGNOSIS — Z171 Estrogen receptor negative status [ER-]: Secondary | ICD-10-CM | POA: Diagnosis not present

## 2022-02-21 DIAGNOSIS — K5900 Constipation, unspecified: Secondary | ICD-10-CM

## 2022-02-21 DIAGNOSIS — C50512 Malignant neoplasm of lower-outer quadrant of left female breast: Secondary | ICD-10-CM

## 2022-02-21 DIAGNOSIS — H3552 Pigmentary retinal dystrophy: Secondary | ICD-10-CM | POA: Diagnosis not present

## 2022-02-21 DIAGNOSIS — Z452 Encounter for adjustment and management of vascular access device: Secondary | ICD-10-CM | POA: Diagnosis not present

## 2022-02-21 DIAGNOSIS — C774 Secondary and unspecified malignant neoplasm of inguinal and lower limb lymph nodes: Secondary | ICD-10-CM | POA: Diagnosis not present

## 2022-02-21 DIAGNOSIS — I5042 Chronic combined systolic (congestive) and diastolic (congestive) heart failure: Secondary | ICD-10-CM | POA: Diagnosis not present

## 2022-02-21 DIAGNOSIS — I110 Hypertensive heart disease with heart failure: Secondary | ICD-10-CM | POA: Diagnosis not present

## 2022-02-21 DIAGNOSIS — Z95828 Presence of other vascular implants and grafts: Secondary | ICD-10-CM

## 2022-02-21 DIAGNOSIS — C7951 Secondary malignant neoplasm of bone: Secondary | ICD-10-CM | POA: Diagnosis not present

## 2022-02-21 DIAGNOSIS — G629 Polyneuropathy, unspecified: Secondary | ICD-10-CM | POA: Diagnosis not present

## 2022-02-21 LAB — COMPREHENSIVE METABOLIC PANEL
ALT: 23 U/L (ref 0–44)
AST: 29 U/L (ref 15–41)
Albumin: 4 g/dL (ref 3.5–5.0)
Alkaline Phosphatase: 106 U/L (ref 38–126)
Anion gap: 5 (ref 5–15)
BUN: 13 mg/dL (ref 8–23)
CO2: 24 mmol/L (ref 22–32)
Calcium: 9.2 mg/dL (ref 8.9–10.3)
Chloride: 106 mmol/L (ref 98–111)
Creatinine, Ser: 0.72 mg/dL (ref 0.44–1.00)
GFR, Estimated: >60 mL/min (ref >60)
Glucose, Bld: 166 mg/dL — ABNORMAL HIGH (ref 70–99)
Potassium: 3.8 mmol/L (ref 3.5–5.1)
Sodium: 135 mmol/L (ref 135–145)
Total Bilirubin: 0.5 mg/dL (ref 0.3–1.2)
Total Protein: 8.1 g/dL (ref 6.5–8.1)

## 2022-02-21 LAB — CBC WITH DIFFERENTIAL/PLATELET
Abs Immature Granulocytes: 0.02 10*3/uL (ref 0.00–0.07)
Basophils Absolute: 0 10*3/uL (ref 0.0–0.1)
Basophils Relative: 0 %
Eosinophils Absolute: 0.3 10*3/uL (ref 0.0–0.5)
Eosinophils Relative: 7 %
HCT: 29.8 % — ABNORMAL LOW (ref 36.0–46.0)
Hemoglobin: 9.8 g/dL — ABNORMAL LOW (ref 12.0–15.0)
Immature Granulocytes: 0 %
Lymphocytes Relative: 12 %
Lymphs Abs: 0.5 10*3/uL — ABNORMAL LOW (ref 0.7–4.0)
MCH: 28.7 pg (ref 26.0–34.0)
MCHC: 32.9 g/dL (ref 30.0–36.0)
MCV: 87.4 fL (ref 80.0–100.0)
Monocytes Absolute: 0.2 10*3/uL (ref 0.1–1.0)
Monocytes Relative: 3 %
Neutro Abs: 3.5 10*3/uL (ref 1.7–7.7)
Neutrophils Relative %: 78 %
Platelets: 199 10*3/uL (ref 150–400)
RBC: 3.41 MIL/uL — ABNORMAL LOW (ref 3.87–5.11)
RDW: 13.5 % (ref 11.5–15.5)
WBC: 4.5 10*3/uL (ref 4.0–10.5)
nRBC: 0 % (ref 0.0–0.2)

## 2022-02-21 MED ORDER — SODIUM CHLORIDE 0.9% FLUSH
10.0000 mL | INTRAVENOUS | Status: DC | PRN
  Administered 2022-02-21: 10 mL via INTRAVENOUS

## 2022-02-21 MED ORDER — HEPARIN SOD (PORK) LOCK FLUSH 100 UNIT/ML IV SOLN
500.0000 [IU] | Freq: Once | INTRAVENOUS | Status: AC | PRN
  Administered 2022-02-21: 500 [IU] via INTRAVENOUS

## 2022-02-21 NOTE — Progress Notes (Signed)
Pt reports not having CT performed due to nausea. Agreed to reschedule.

## 2022-02-21 NOTE — Patient Instructions (Signed)
It is common for patients who are undergoing treatment and taking certain prescribed medications to experience side-effects with constipation.  If you experience constipation, please take stool softener such as Colace or Senna one tablet twice a day everyday to avoid constipation.  These medications are available over the counter.  Of course, if you have diarrhea, stop taking stool softeners.  Drinking plenty of fluid, eating fruits and vegetable, and being active also reduces the risk of constipation.   If despite taking stool softeners, and you still have no bowel movement for 2 days or more than your normal bowel habit frequency, please take one of the following over the counter laxatives:  MiraLax, Milk of Magnesia or Mag Citrate everyday. The goal is to have at least one bowel movement every other day.   

## 2022-02-25 ENCOUNTER — Ambulatory Visit (HOSPITAL_COMMUNITY)
Admission: RE | Admit: 2022-02-25 | Discharge: 2022-02-25 | Disposition: A | Discharge status: Home / Self Care | Payer: Medicare Other | Source: Ambulatory Visit | Attending: Hematology | Admitting: Hematology

## 2022-02-25 ENCOUNTER — Encounter (HOSPITAL_COMMUNITY): Payer: Self-pay

## 2022-02-25 DIAGNOSIS — C50512 Malignant neoplasm of lower-outer quadrant of left female breast: Secondary | ICD-10-CM | POA: Insufficient documentation

## 2022-02-25 DIAGNOSIS — Z171 Estrogen receptor negative status [ER-]: Secondary | ICD-10-CM | POA: Insufficient documentation

## 2022-02-25 DIAGNOSIS — C7981 Secondary malignant neoplasm of breast: Secondary | ICD-10-CM | POA: Diagnosis not present

## 2022-02-25 DIAGNOSIS — C801 Malignant (primary) neoplasm, unspecified: Secondary | ICD-10-CM | POA: Diagnosis not present

## 2022-02-25 MED ORDER — SODIUM CHLORIDE (PF) 0.9 % IJ SOLN
INTRAMUSCULAR | Status: AC
  Filled 2022-02-25: qty 50

## 2022-02-25 MED ORDER — IOHEXOL 300 MG/ML  SOLN
100.0000 mL | Freq: Once | INTRAMUSCULAR | Status: AC | PRN
  Administered 2022-02-25: 100 mL via INTRAVENOUS

## 2022-02-25 MED ORDER — HEPARIN SOD (PORK) LOCK FLUSH 100 UNIT/ML IV SOLN
INTRAVENOUS | Status: AC
  Administered 2022-02-25: 500 [IU]
  Filled 2022-02-25: qty 5

## 2022-03-04 ENCOUNTER — Other Ambulatory Visit: Payer: Self-pay

## 2022-03-07 MED FILL — Dexamethasone Sodium Phosphate Inj 100 MG/10ML: INTRAMUSCULAR | Qty: 1 | Status: AC

## 2022-03-08 ENCOUNTER — Inpatient Hospital Stay (HOSPITAL_BASED_OUTPATIENT_CLINIC_OR_DEPARTMENT_OTHER): Payer: Medicare Other | Admitting: Hematology

## 2022-03-08 ENCOUNTER — Inpatient Hospital Stay: Payer: Medicare Other

## 2022-03-08 ENCOUNTER — Encounter: Payer: Self-pay | Admitting: Hematology

## 2022-03-08 ENCOUNTER — Other Ambulatory Visit: Payer: Self-pay

## 2022-03-08 VITALS — BP 148/82 | HR 72 | Temp 98.5°F | Resp 16 | Wt 225.2 lb

## 2022-03-08 DIAGNOSIS — I5042 Chronic combined systolic (congestive) and diastolic (congestive) heart failure: Secondary | ICD-10-CM | POA: Diagnosis not present

## 2022-03-08 DIAGNOSIS — G629 Polyneuropathy, unspecified: Secondary | ICD-10-CM | POA: Diagnosis not present

## 2022-03-08 DIAGNOSIS — E559 Vitamin D deficiency, unspecified: Secondary | ICD-10-CM | POA: Diagnosis not present

## 2022-03-08 DIAGNOSIS — Z171 Estrogen receptor negative status [ER-]: Secondary | ICD-10-CM

## 2022-03-08 DIAGNOSIS — E1165 Type 2 diabetes mellitus with hyperglycemia: Secondary | ICD-10-CM | POA: Diagnosis not present

## 2022-03-08 DIAGNOSIS — C50512 Malignant neoplasm of lower-outer quadrant of left female breast: Secondary | ICD-10-CM

## 2022-03-08 DIAGNOSIS — Z95828 Presence of other vascular implants and grafts: Secondary | ICD-10-CM

## 2022-03-08 DIAGNOSIS — J449 Chronic obstructive pulmonary disease, unspecified: Secondary | ICD-10-CM | POA: Diagnosis not present

## 2022-03-08 DIAGNOSIS — H3552 Pigmentary retinal dystrophy: Secondary | ICD-10-CM | POA: Diagnosis not present

## 2022-03-08 DIAGNOSIS — C774 Secondary and unspecified malignant neoplasm of inguinal and lower limb lymph nodes: Secondary | ICD-10-CM | POA: Diagnosis not present

## 2022-03-08 DIAGNOSIS — Z452 Encounter for adjustment and management of vascular access device: Secondary | ICD-10-CM | POA: Diagnosis not present

## 2022-03-08 DIAGNOSIS — Z5112 Encounter for antineoplastic immunotherapy: Secondary | ICD-10-CM | POA: Diagnosis not present

## 2022-03-08 DIAGNOSIS — Z7189 Other specified counseling: Secondary | ICD-10-CM

## 2022-03-08 DIAGNOSIS — I110 Hypertensive heart disease with heart failure: Secondary | ICD-10-CM | POA: Diagnosis not present

## 2022-03-08 DIAGNOSIS — C7951 Secondary malignant neoplasm of bone: Secondary | ICD-10-CM | POA: Diagnosis not present

## 2022-03-08 LAB — COMPREHENSIVE METABOLIC PANEL
ALT: 25 U/L (ref 0–44)
AST: 28 U/L (ref 15–41)
Albumin: 3.7 g/dL (ref 3.5–5.0)
Alkaline Phosphatase: 94 U/L (ref 38–126)
Anion gap: 5 (ref 5–15)
BUN: 11 mg/dL (ref 8–23)
CO2: 27 mmol/L (ref 22–32)
Calcium: 8.7 mg/dL — ABNORMAL LOW (ref 8.9–10.3)
Chloride: 110 mmol/L (ref 98–111)
Creatinine, Ser: 0.75 mg/dL (ref 0.44–1.00)
GFR, Estimated: >60 mL/min (ref >60)
Glucose, Bld: 175 mg/dL — ABNORMAL HIGH (ref 70–99)
Potassium: 3.9 mmol/L (ref 3.5–5.1)
Sodium: 142 mmol/L (ref 135–145)
Total Bilirubin: 0.4 mg/dL (ref 0.3–1.2)
Total Protein: 7.4 g/dL (ref 6.5–8.1)

## 2022-03-08 LAB — CBC WITH DIFFERENTIAL/PLATELET
Abs Immature Granulocytes: 0.07 10*3/uL (ref 0.00–0.07)
Basophils Absolute: 0 10*3/uL (ref 0.0–0.1)
Basophils Relative: 0 %
Eosinophils Absolute: 0.2 10*3/uL (ref 0.0–0.5)
Eosinophils Relative: 5 %
HCT: 30.7 % — ABNORMAL LOW (ref 36.0–46.0)
Hemoglobin: 9.8 g/dL — ABNORMAL LOW (ref 12.0–15.0)
Immature Granulocytes: 2 %
Lymphocytes Relative: 13 %
Lymphs Abs: 0.5 10*3/uL — ABNORMAL LOW (ref 0.7–4.0)
MCH: 28.7 pg (ref 26.0–34.0)
MCHC: 31.9 g/dL (ref 30.0–36.0)
MCV: 90 fL (ref 80.0–100.0)
Monocytes Absolute: 0.6 10*3/uL (ref 0.1–1.0)
Monocytes Relative: 14 %
Neutro Abs: 2.7 10*3/uL (ref 1.7–7.7)
Neutrophils Relative %: 66 %
Platelets: 362 10*3/uL (ref 150–400)
RBC: 3.41 MIL/uL — ABNORMAL LOW (ref 3.87–5.11)
RDW: 15.4 % (ref 11.5–15.5)
WBC: 4.1 10*3/uL (ref 4.0–10.5)
nRBC: 0 % (ref 0.0–0.2)

## 2022-03-08 MED ORDER — SODIUM CHLORIDE 0.9% FLUSH
10.0000 mL | INTRAVENOUS | Status: DC | PRN
  Administered 2022-03-08: 10 mL via INTRAVENOUS

## 2022-03-08 MED ORDER — HEPARIN SOD (PORK) LOCK FLUSH 100 UNIT/ML IV SOLN
500.0000 [IU] | Freq: Once | INTRAVENOUS | Status: AC | PRN
  Administered 2022-03-08: 500 [IU]

## 2022-03-08 MED ORDER — SODIUM CHLORIDE 0.9% FLUSH
10.0000 mL | INTRAVENOUS | Status: DC | PRN
  Administered 2022-03-08: 10 mL

## 2022-03-08 MED ORDER — DEXTROSE 5 % IV SOLN: Freq: Once | INTRAVENOUS | Status: AC

## 2022-03-08 MED ORDER — PROMETHAZINE HCL 25 MG PO TABS: 25.0000 mg | ORAL_TABLET | Freq: Four times a day (QID) | ORAL | 0 refills | Status: DC | PRN

## 2022-03-08 MED ORDER — ACETAMINOPHEN 325 MG PO TABS
650.0000 mg | ORAL_TABLET | Freq: Once | ORAL | Status: AC
  Administered 2022-03-08: 650 mg via ORAL
  Filled 2022-03-08: qty 2

## 2022-03-08 MED ORDER — FAM-TRASTUZUMAB DERUXTECAN-NXKI CHEMO 100 MG IV SOLR
5.1400 mg/kg | Freq: Once | INTRAVENOUS | Status: AC
  Administered 2022-03-08: 500 mg via INTRAVENOUS
  Filled 2022-03-08: qty 25

## 2022-03-08 MED ORDER — SODIUM CHLORIDE 0.9 % IV SOLN
150.0000 mg | Freq: Once | INTRAVENOUS | Status: AC
  Administered 2022-03-08: 150 mg via INTRAVENOUS
  Filled 2022-03-08: qty 150

## 2022-03-08 MED ORDER — SODIUM CHLORIDE 0.9 % IV SOLN
10.0000 mg | Freq: Once | INTRAVENOUS | Status: AC
  Administered 2022-03-08: 10 mg via INTRAVENOUS
  Filled 2022-03-08: qty 10

## 2022-03-08 MED ORDER — PALONOSETRON HCL INJECTION 0.25 MG/5ML
0.2500 mg | Freq: Once | INTRAVENOUS | Status: AC
  Administered 2022-03-08: 0.25 mg via INTRAVENOUS
  Filled 2022-03-08: qty 5

## 2022-03-08 MED ORDER — DIPHENHYDRAMINE HCL 25 MG PO CAPS
50.0000 mg | ORAL_CAPSULE | Freq: Once | ORAL | Status: AC
  Administered 2022-03-08: 50 mg via ORAL
  Filled 2022-03-08: qty 2

## 2022-03-08 NOTE — Patient Instructions (Signed)
[facility]  Discharge Instructions: Thank you for choosing [facility] to provide your oncology and hematology care.   If you have a lab appointment with the [facility], please go directly to the [facility] and check in at the registration area.   Wear comfortable clothing and clothing appropriate for easy access to any Portacath or PICC line.   We strive to give you quality time with your provider. You may need to reschedule your appointment if you arrive late (15 or more minutes).  Arriving late affects you and other patients whose appointments are after yours.  Also, if you miss three or more appointments without notifying the office, you may be dismissed from the clinic at the provider's discretion.      For prescription refill requests, have your pharmacy contact our office and allow 72 hours for refills to be completed.    Today you received the following chemotherapy and/or immunotherapy agents: Enhertu   To help prevent nausea and vomiting after your treatment, we encourage you to take your nausea medication as directed.  BELOW ARE SYMPTOMS THAT SHOULD BE REPORTED IMMEDIATELY: *FEVER GREATER THAN 100.4 F (38 C) OR HIGHER *CHILLS OR SWEATING *NAUSEA AND VOMITING THAT IS NOT CONTROLLED WITH YOUR NAUSEA MEDICATION *UNUSUAL SHORTNESS OF BREATH *UNUSUAL BRUISING OR BLEEDING *URINARY PROBLEMS (pain or burning when urinating, or frequent urination) *BOWEL PROBLEMS (unusual diarrhea, constipation, pain near the anus) TENDERNESS IN MOUTH AND THROAT WITH OR WITHOUT PRESENCE OF ULCERS (sore throat, sores in mouth, or a toothache) UNUSUAL RASH, SWELLING OR PAIN  UNUSUAL VAGINAL DISCHARGE OR ITCHING   Items with * indicate a potential emergency and should be followed up as soon as possible or go to the Emergency Department if any problems should occur.  Please show the CHEMOTHERAPY ALERT CARD or IMMUNOTHERAPY ALERT CARD at check-in to the  Emergency Department and triage nurse.  Should you have questions after your visit or need to cancel or reschedule your appointment, please contact [facility]  Dept: [phone]  and follow the prompts.  Office hours are 8:00 a.m. to 4:30 p.m. Monday - Friday. Please note that voicemails left after 4:00 p.m. may not be returned until the following business day.  We are closed weekends and major holidays. You have access to a nurse at all times for urgent questions. Please call the main number to the clinic Dept: [phone] and follow the prompts.   For any non-urgent questions, you may also contact your provider using MyChart. We now offer e-Visits for anyone 16 and older to request care online for non-urgent symptoms. For details visit [email or web address].   Also download the MyChart app! Go to the app store, search "MyChart", open the app, select [facility], and log in with your MyChart username and password.  Masks are optional in the cancer centers. If you would like for your care team to wear a mask while they are taking care of you, please let them know. For doctor visits, patients may have with them one support person who is at least 64 years old. At this time, visitors are not allowed in the infusion area.

## 2022-03-08 NOTE — Progress Notes (Signed)
[name]   [phone] [phone]   Clinic Follow up Note   Patient Care Team: [name], MD as PCP - General (Family Medicine) [name], MD as PCP - Cardiology (Cardiology) [name], MD as Consulting Physician (Hematology) [name], MD as Consulting Physician (General Surgery) [name], [facility] as Pharmacist (Pharmacist) Jacelyn Pi, MD as Referring Physician (Endocrinology)  Date of Service:  03/08/2022  CHIEF COMPLAINT: f/u of metastatic breast cancer  CURRENT THERAPY:  Enhertu, q3weeks, starting 02/15/22 -Zometa, q59month, starting 11/20/20  ASSESSMENT & PLAN:  PIndria Bisharais a 66y.o. female with   1. Left breast invasive ductal carcinoma, stage IB, p(T1cN0M0), Triple negative, Grade 3, in 2018, metastatic disease in 08/2020 ER 20% weakly + PR/HER2 (0) negative, PD-L1 0%, genetics (-), HER2 1+ on node biopsy  -diagnosed with triple negative breast cancer in 05/2017. She is s/p left breast lumpectomy, adjuvant chemo TC and Radiation.  -Genetic testing was negative for pathogenetic mutations.  -Due to new right gluteal pain, she underwent biopsy on 09/02/20 which showed metastatic carcinoma, consistent with breast primary. ER 20% positive, PR and HER2 negative.  -she received 4 cycles of Taxol 09/26/20 - 12/18/20, discontinued due to worsening neuropathy, then Xeloda from 01/01/21 - 06/2021. -she switched to Trodelvy 07/03/21 - 01/11/22 due to disease progression. Dose reduced due to nausea and diarrhea. Chemo delayed in 09/2021 due to uncontrolled hyperglycemia.  -biopsy of a right inguinal lymph node on 01/15/22 showed adenocarcinoma, consistent with metastatic breast carcinoma. Prognostic panel confirmed triple negative disease (Her2 1+ low expression).  -she received palliative radiation to the inguinal nodes under Dr. MLisbeth Renshaw6/19/23 - 02/08/22.  -new baseline CT CAP on 02/25/22 showed: mild improvement to right groin  adenopathy; new/progressive upper abdominal adenopathy; increase in retroperitoneal stranding, R>L; stable right iliac bone lesion. I reviewed the results and discussed the plan to use this as a comparison with her next scan. -She started Enhertu 3 weeks ago, has significant nausea, low appetite, and fatigue which lasted over a week.  She has recovered well. -Labs reviewed, stable and adequate to continue as scheduled.  We have added IV Emend as premedication, and I called in Phenergan as needed for nausea for her.  If she still has tolerance issues, I may start to reduce her dose next cycle. -We will monitor her echo closely due to her underlying CHF.  Next 1 is scheduled for August 21th    2.  Treatment related side effects (nausea vomiting, back pain) -she reports she had nausea and vomiting following treatment. We reviewed how she was taking compazine and zofran, and I advised her to increase how often she takes the compazine. I will also call in phenergan for her to use.   3. Chronic combined systolic and diastolic heart failure -due to concerns for reversible cardiotoxicity with Enhertu, she was evaluated by Dr. BHaroldine Laws Echo on 01/31/22 showed EF 35-40%, GLS -12.9%, RV mildly reduced.  -will monitor with repeat echo 04/01/22   4. Diabetes and uncontrolled hyperglycemia -worsening hyperglycemia lately, probably related to dexa (before chemo) and Trodelvy  -her hyperglycemia is now better controlled on 25u Lantus and sliding scale insulin.   5. Bone metastases, Vit D deficiency -She had radiating right hip pain since 2020 from right iliac wing mass -She completed Palliative radiation to right iliac 09/04/20 - 09/22/20 with Dr. MLisbeth Renshaw and her pain is much improved.  -Given localized bone met, she started Zometa q337monthon  11/20/20. Most recent dose 02/15/22 -vit D has remained low, most recently 20.86 on 08/17/21.   6. G2 peripheral Neuropathy  -S/p C2D8 Taxol she developed mild tingling in her  hands and feet. Taxol stopped after 12/18/20. Her neuropathy is improving.  -Overall stable   7. HTN, COPD, retinitis pigmentosa with limited vision -Continue to follow-up with PCP Dr Redmond School and cardiologist Dr Ellyn Hack  -she is legally blind -Blood pressure 148/82 today (03/08/22).   8. Goal of care discussion  -The patient understands the goal of care is palliative. -she is full code now    PLAN: -proceed with second Enhertu today  -I called in phenergan -phone visit 7/31 to f/u on nausea -lab, flush, and Enhertu in 3 weeks   No problem-specific Assessment & Plan notes found for this encounter.   SUMMARY OF ONCOLOGIC HISTORY: Oncology History Overview Note  Cancer Staging Malignant neoplasm of lower-outer quadrant of left breast of female, estrogen receptor negative ([place]) Staging form: Breast, AJCC 8th Edition - Clinical stage from 06/06/2017: Stage IB (cT1c, cN0, cM0, G3, ER-, PR-, HER2-) - Signed by [name], MD on 06/15/2017 Nuclear grade: G3 Histologic grading system: 3 grade system - Pathologic stage from 06/26/2017: Stage IB (pT1c, pN0, cM0, G3, ER-, PR-, HER2-) - Signed by [name], MD on 07/10/2017 Neoadjuvant therapy: No Nuclear grade: G3 Multigene prognostic tests performed: None Histologic grading system: 3 grade system Laterality: Left     Malignant neoplasm of lower-outer quadrant of left breast of female, estrogen receptor negative ([place])  05/30/2017 Mammogram   Diagnostic mammo and US IMPRESSION: 1. Highly suspicious 1.4 cm mass in the slightly lower slightly outer left breast -tissue sampling recommended. 2. Indeterminate 0.5 mm mass in the slightly lower slightly outer left breast -tissue sampling recommended. 3. At least 2 left axillary lymph nodes with borderline cortical thickness.   06/06/2017 Initial Biopsy   Diagnosis 1. Breast, left, needle core biopsy, 5:30 o'clock - INVASIVE DUCTAL CARCINOMA, G3 2. Lymph node, needle/core biopsy, left  axillary - NO CARCINOMA IDENTIFIED IN ONE LYMPH NODE (0/1)   06/06/2017 Initial Diagnosis   Malignant neoplasm of lower-outer quadrant of left breast of female, estrogen receptor negative ([place])   06/06/2017 Receptors her2   Estrogen Receptor: 0%, NEGATIVE Progesterone Receptor: 0%, NEGATIVE Proliferation Marker Ki67: 70%   06/26/2017 Surgery   LEFT BREAST LUMPECTOMY WITH RADIOACTIVE SEED AND SENTINEL LYMPH NODE BIOPSY ERAS  PATHWAY AND INSERTION PORT-A-CATH By Dr. Barry Dienes on 06/26/17    06/26/2017 Pathology Results   Diagnosis 06/26/17  1. Breast, lumpectomy, Left - INVASIVE DUCTAL CARCINOMA, GRADE III/III, SPANNING 1.2 CM. - THE SURGICAL RESECTION MARGINS ARE NEGATIVE FOR CARCINOMA. - SEE ONCOLOGY TABLE BELOW. 2. Lymph node, sentinel, biopsy, Left axillary #1 - THERE IS NO EVIDENCE OF CARCINOMA IN 1 OF 1 LYMPH NODE (0/1). 3. Lymph node, sentinel, biopsy, Left axillary #2 - THERE IS NO EVIDENCE OF CARCINOMA IN 1 OF 1 LYMPH NODE (0/1). 4. Lymph node, sentinel, biopsy, Left axillary #3 - THERE IS NO EVIDENCE OF CARCINOMA IN 1 OF 1 LYMPH NODE (0/1).    07/25/2017 - 09/29/2017 Chemotherapy   Adjuvant cytoxan and docetaxel (TC) every 3 weekd for 4 cycles     11/19/2017 - 12/17/2017 Radiation Therapy   Adjuvant breast radiation Left breast treated to 42.5 Gy with 17 fx of 2.5 Gy followed by a boost of 7.5 Gy with 3 fx of 2.5 Gy      02/2019 Procedure   She had PAC removal in  02/2019.    07/23/2020 Imaging   MRI Lumbar Spine 07/23/20 IMPRESSION: 1. No significant disc herniation, spinal canal or neural foraminal stenosis at any level. 2. Moderate facet degenerative changes at L3-4, L4-5 and L5-S1. 3. Multiple enlarged retroperitoneal and right iliac lymph nodes. Recommend correlation with CT of the abdomen and pelvis with contrast.   08/15/2020 Imaging   CT AP 08/15/20  IMPRESSION: 1. Right iliac and periaortic adenopathy is noted concerning for metastatic disease or lymphoma.  Also noted is abnormal soft tissue mass anterior and posterior to the right iliac wing concerning for malignancy or metastatic disease. MRI is recommended for further evaluation. 2. Irregular lucency with sclerotic margins is seen involving the right iliac crest concerning for possible lytic lesion. MRI may be performed for further evaluation. 3. Enlarged fibroid uterus. 4. Small gallstone. 5. Aortic atherosclerosis.   08/22/2020 Pathology Results   FINAL MICROSCOPIC DIAGNOSIS: 08/22/20  A. NEEDLE CORE, RIGHT GLUTEUS MINIMUMUS, BIOPSY:  -  Metastatic carcinoma  -  See comment   COMMENT:  By immunohistochemistry, the neoplastic cells are positive for  cytokeratin-7 and have weak patchy positivity for GATA3 but are negative for cytokeratin-20 and GCDFP.  The combined morphology and immunophenotype, support metastasis from the patient's known breast primary carcinoma.   ADDENDUM:  PROGNOSTIC INDICATOR RESULTS:  The tumor cells are NEGATIVE for Her2 (0).  Estrogen Receptor:       POSITIVE, 20%, WEAK STAINING  Progesterone Receptor:   NEGATIVE   PD-L1 (0) negative    09/04/2020 - 09/22/2020 Radiation Therapy   Palliative radiation to right iliac 09/04/20 - 09/22/20, Dr. Lisbeth Renshaw   09/26/2020 -  Chemotherapy   first line weekly Taxol 3 weeks on/1 week off starting 09/26/20 --Reduced to 2 weeks on/1 week off starting with C3 due to neuropathy. Stopped after C4 on 12/18/20 due to neuratrophy.  --Switched to Xeloda on 01/01/21 at 1547m in the AM and 20082min the PM for 2 weeks on/1 week off     10/13/2020 Procedure    PAC placement on 10/13/20.    04/10/2021 Imaging   CT CAP  IMPRESSION: 1. Enlarging abdominopelvic retroperitoneal and right inguinal adenopathy, compatible with disease progression. 2. Lytic metastasis in the right iliac wing, similar. 3. Hepatomegaly. 4. Cholelithiasis. 5. Enlarged fibroid uterus. 6.  Aortic atherosclerosis (ICD10-I70.0).   06/18/2021 Imaging   CT  CAP  IMPRESSION: 1. Today's study demonstrates progression of metastatic disease as evidence by increased number and size of numerous enlarged lymph nodes in the retroperitoneum, along the right pelvic sidewall, and in the upper right thigh. 2. Osseous metastases in the bony pelvis appear similar to the prior examination. No definite new osseous lesions are otherwise noted. 3. Fibroid uterus again noted. 4. Cholelithiasis without evidence of acute cholecystitis. 5. Aortic atherosclerosis, in addition to left anterior descending coronary artery disease. Please note that although the presence of coronary artery calcium documents the presence of coronary artery disease, the severity of this disease and any potential stenosis cannot be assessed on this non-gated CT examination. Assessment for potential risk factor modification, dietary therapy or pharmacologic therapy may be warranted, if clinically indicated. 6. Additional incidental findings, as above.   06/20/2021 Imaging   Bone Scan  IMPRESSION: No osseous uptake suspicious for recurrent/progressive metastatic disease.   07/03/2021 - 01/11/2022 Chemotherapy   Patient is on Treatment Plan : BREAST METASTATIC Sacituzumab govitecan-hziy (TIvette Loyalq21d     09/27/2021 Imaging   EXAM: CT CHEST, ABDOMEN, AND PELVIS WITH CONTRAST  IMPRESSION:  1. Marked interval improvement in previously demonstrated lymphadenopathy in the abdomen and pelvis. A few prominent right inguinal lymph nodes remain. 2. Stable osseous metastatic disease in the pelvis. 3. No evidence of local recurrence or other metastatic disease. 4. Stable incidental findings including cholelithiasis, uterine fibroids and Aortic Atherosclerosis (ICD10-I70.0).  ADDENDUM: Not mentioned in the original impression is a possible nonocclusive fibrin sheath along the distal aspect of the Port-A-Cath.   09/27/2021 Imaging   EXAM: NUCLEAR MEDICINE WHOLE BODY BONE  SCAN  IMPRESSION: Stable examination, without abnormal osseous uptake.   11/27/2021 Imaging   CT AP IMPRESSION: 1. Stable lytic and sclerotic lesion in the RIGHT iliac wing. 2. No evidence of visceral metastasis or adenopathy in the abdomenpelvis. 3. Leiomyomatous uterus.  ADDENDUM REPORT: 11/29/2021 09:33 Upon further review and discussion with Cira Rue NP, the RIGHT inguinal lymph nodes previous described have increased in volume. For example 14 mm node (image 91/2) is increased from 9 mm. Adjacent 14 mm node on image 86 is increased from 8 mm. Additionally, LEFT common iliac lymph node is also slightly increased measuring 10 mm (46/2) compared to 7 mm on comparison exam from 09/27/2021. These sites demonstrates similar but greater lymphadenopathy on CT 06/18/2021.   02/15/2022 -  Chemotherapy   Patient is on Treatment Plan : BREAST METASTATIC fam-trastuzumab deruxtecan-nxki (Enhertu) q21d        INTERVAL HISTORY:  Kristin Ward is here for a follow up of metastatic breast cancer. She was last seen by PA Cassie on 02/21/22. She presents to the clinic alone. She reports first cycle of Enhertu was difficulty-- she reports nausea with vomiting. She reports she feels her left inguinal nodes feel bigger.  She notes she walks for about an hour very early in the mornings. She reports some ankle swelling causing difficulty with walking longer distances or uphill.   All other systems were reviewed with the patient and are negative.  MEDICAL HISTORY:  Past Medical History:  Diagnosis Date   Abnormal x-ray of lungs with single pulmonary nodule 03/15/2016   per records from [place]    Anemia    Asthma    Cholelithiasis 05/19/2017   On CT   Coronary artery calcification seen on CAT scan 05/19/2017   Diabetes mellitus without complication ([place])    type 2   Diabetes mellitus, new onset ([place]) 01/20/2019   Dilated idiopathic cardiomyopathy ([place]) 12/2015   EF 15-20%. Diagnosed in  [facility]   Headache    NONE RECENT   Hypertension    Lymph nodes enlarged 07/25/2020   Malignant neoplasm of lower-outer quadrant of left breast of female, estrogen receptor negative ([place]) 06/11/2017   left breast   Mixed hyperlipidemia 06/17/2018   partially blind    Personal history of chemotherapy 2018-2019   Personal history of radiation therapy 2019   Retinitis pigmentosa    Retroperitoneal lymphadenopathy 07/25/2020   Uveitis     SURGICAL HISTORY: Past Surgical History:  Procedure Laterality Date   brain cyst removed  2007   to help with headaches per patient , aspirated    BREAST BIOPSY Left 06/06/2017   x2   BREAST CYST ASPIRATION Left 06/06/2017   BREAST LUMPECTOMY Left 06/26/2017   BREAST LUMPECTOMY WITH RADIOACTIVE SEED AND SENTINEL LYMPH NODE BIOPSY Left 06/26/2017   Procedure: BREAST LUMPECTOMY WITH RADIOACTIVE SEED AND SENTINEL LYMPH NODE BIOPSY ERAS  PATHWAY;  Surgeon: [name], MD;  Location: [place];  Service: General;  Laterality: Left;  pec block  FINE NEEDLE ASPIRATION Left 02/23/2019   Procedure: FINE NEEDLE ASPIRATION;  Surgeon: [name], MD;  Location: [place];  Service: General;  Laterality: Left;  Asiration of left breast seroma    IR IMAGING GUIDED PORT INSERTION  10/13/2020   NASAL TURBINATE REDUCTION     PORT-A-CATH REMOVAL Right 02/23/2019   Procedure: REMOVAL PORT-A-CATH;  Surgeon: [name], MD;  Location: [place];  Service: General;  Laterality: Right;   PORTACATH PLACEMENT N/A 06/26/2017   Procedure: INSERTION PORT-A-CATH;  Surgeon: [name], MD;  Location: [place];  Service: General;  Laterality: N/A;   TRANSTHORACIC ECHOCARDIOGRAM  12/2015   A) [facility] May 2017: Mild concentric LVH. Global hypokinesis. GR daily. EF 18% severe LA dilation. Mitral annular dilatation with papillary muscle dysfunction and moderate MR. Dilated IVC consistent with elevated RAP.    TRANSTHORACIC ECHOCARDIOGRAM  05/2017; 08/2017   a) Mild concentric LVH.  EF 45 to 50% with diffuse hypokinesis.  GR 1 DD.  Mild aortic root dilation.;; b)  Mild LVH EF 45 to 50%.  Diffuse HK.  No significant valve disease.  No significant change.    I have reviewed the social history and family history with the patient and they are unchanged from previous note.  ALLERGIES:  is allergic to morphine and related, latex, tylenol with codeine #3 [acetaminophen-codeine], and penicillins.  MEDICATIONS:  Current Outpatient Medications  Medication Sig Dispense Refill   promethazine (PHENERGAN) 25 MG tablet Take 1 tablet (25 mg total) by mouth every 6 (six) hours as needed for nausea or vomiting. 30 tablet 0   Accu-Chek FastClix Lancets MISC TEST TWICE A DAY. PT USES AN ACCU-CHEK GUIDE ME METER 102 each 2   amLODipine (NORVASC) 5 MG tablet Take 1 tablet (5 mg total) by mouth daily. 30 tablet 3   aspirin 81 MG chewable tablet Chew 81 mg by mouth daily.     atorvastatin (LIPITOR) 20 MG tablet Take 1 tablet (20 mg total) by mouth daily. 90 tablet 3   brimonidine (ALPHAGAN) 0.2 % ophthalmic solution Place 1 drop into both eyes 2 (two) times daily.     carvedilol (COREG) 12.5 MG tablet Take 1 tablet (12.5 mg total) by mouth 2 (two) times daily with a meal. 180 tablet 1   cholecalciferol (VITAMIN D3) 25 MCG (1000 UNIT) tablet Take 2,000 Units by mouth daily.     Continuous Blood Gluc Receiver (DEXCOM G7 RECEIVER) DEVI 1 Device by Does not apply route 2 (two) times daily. 1 each 1   Continuous Blood Gluc Sensor (DEXCOM G7 SENSOR) MISC Use as directed, replace every 10 DAYS 3 each 10   furosemide (LASIX) 20 MG tablet TAKE 2 TABLETS (40 MG TOTAL) BY MOUTH AS NEEDED. FOR WEIGHT GAIN 3 TO 4 LBS. 90 tablet 3   HUMALOG KWIKPEN 100 UNIT/ML KwikPen Inject 3-10 Units into the skin 3 (three) times daily before meals. Per sliding scale     ibuprofen (ADVIL) 800 MG tablet Take 1 tablet (800 mg total) by mouth every 8  (eight) hours as needed. 30 tablet 2   insulin glargine (LANTUS) 100 UNIT/ML injection Inject 25 Units into the skin at bedtime. 15 units in am  on chemo week     Lancets Misc. (ACCU-CHEK SOFTCLIX LANCET DEV) KIT 1 Units by Does not apply route 2 (two) times daily. Check FSBS BID - Include strips # 50 with 5 refills, Lancets #50 with 5 refills DX: Type 2 DM - ICD 10:  E11.9 1 kit 0   lidocaine-prilocaine (EMLA) cream Apply 1 application topically as needed. 30 g 0   linaclotide (LINZESS) 72 MCG capsule Take 72 mcg by mouth as needed.     metFORMIN (GLUCOPHAGE) 1000 MG tablet Take 1 tablet (1,000 mg total) by mouth 2 (two) times daily with a meal. 180 tablet 3   Multiple Vitamins-Minerals (WOMENS MULTI PO) Take 15 mLs by mouth daily.     ondansetron (ZOFRAN) 8 MG tablet Take 1 tablet (8 mg total) by mouth every 8 (eight) hours as needed for nausea or vomiting (begin on day 3 after chemo). 30 tablet 1   oxyCODONE (OXY IR/ROXICODONE) 5 MG immediate release tablet Take 1 tablet (5 mg total) by mouth every 8 (eight) hours as needed for severe pain. 60 tablet 0   oxyCODONE-acetaminophen (PERCOCET/ROXICET) 5-325 MG tablet Take 1 tablet by mouth every 6 (six) hours as needed for severe pain.     pantoprazole (PROTONIX) 40 MG tablet TAKE 1 TABLET BY MOUTH EVERY DAY 90 tablet 1   prochlorperazine (COMPAZINE) 10 MG tablet Take 1 tablet (10 mg total) by mouth every 6 (six) hours as needed (Nausea or vomiting). 30 tablet 1   sacubitril-valsartan (ENTRESTO) 97-103 MG Take 1 tablet by mouth 2 (two) times daily. 180 tablet 3   Semaglutide (RYBELSUS) 7 MG TABS Take 7 mg by mouth daily. 90 tablet 3   spironolactone (ALDACTONE) 25 MG tablet Take 25 mg by mouth daily.     vitamin B-12 (CYANOCOBALAMIN) 1000 MCG tablet Take 1,000 mcg by mouth daily.     vitamin C (ASCORBIC ACID) 500 MG tablet Take 500 mg by mouth in the morning, at noon, in the evening, and at bedtime.     zinc gluconate 50 MG tablet Take 50 mg by mouth  daily.     Current Facility-Administered Medications  Medication Dose Route Frequency Provider Last Rate Last Admin   insulin glulisine (APIDRA) injection 6 Units  6 Units Subcutaneous Once Williams, Lynne B, PA-C       Facility-Administered Medications Ordered in Other Visits  Medication Dose Route Frequency Provider Last Rate Last Admin   sodium chloride flush (NS) 0.9 % injection 10 mL  10 mL Intracatheter PRN [name], MD   10 mL at 03/08/22 1311    PHYSICAL EXAMINATION: ECOG PERFORMANCE STATUS: 1 - Symptomatic but completely ambulatory  Vitals:   03/08/22 1027  BP: (!) 148/82  Pulse: 72  Resp: 16  Temp: 98.5 F (36.9 C)  SpO2: 97%   Wt Readings from Last 3 Encounters:  03/08/22 225 lb 3 oz (102.1 kg)  02/21/22 213 lb 1.6 oz (96.7 kg)  02/15/22 214 lb (97.1 kg)     GENERAL:alert, no distress and comfortable SKIN: skin color, texture, turgor are normal, no rashes or significant lesions EYES: normal, Conjunctiva are pink and non-injected, sclera clear LYMPH:  no palpable lymphadenopathy in the cervical, axillary, (+) mildly palpable right inguinal nodes, smaller than prior, (+) tenderness to palpation and fullness on left side, no palpable nodes LUNGS: clear to auscultation and percussion with normal breathing effort HEART: regular rate & rhythm, (+) mild lower extremity edema, (+) very mild murmur ABDOMEN:abdomen soft, non-tender and normal bowel sounds Musculoskeletal:no cyanosis of digits and no clubbing  NEURO: alert & oriented x 3 with fluent speech, no focal motor/sensory deficits  LABORATORY DATA:  I have reviewed the data as listed    Latest Ref Rng & Units 03/08/2022   10:24 AM 02/21/2022  8:19 AM 02/08/2022    8:12 AM  CBC  WBC 4.0 - 10.5 K/uL 4.1  4.5  4.5   Hemoglobin 12.0 - 15.0 g/dL 9.8  9.8  10.1   Hematocrit 36.0 - 46.0 % 30.7  29.8  31.2   Platelets 150 - 400 K/uL 362  199  262         Latest Ref Rng & Units 03/08/2022   10:24 AM 02/21/2022     8:19 AM 02/08/2022    8:12 AM  CMP  Glucose 70 - 99 mg/dL 175  166  114   BUN 8 - 23 mg/dL _0 Creatinine 0.44 - 1.00 mg/dL 0.75  0.72  0.70   Sodium 135 - 145 mmol/L 142  135  141   Potassium 3.5 - 5.1 mmol/L 3.9  3.8  4.2   Chloride 98 - 111 mmol/L 110  106  110   CO2 22 - 32 mmol/L _1 Calcium 8.9 - 10.3 mg/dL 8.7  9.2  9.5   Total Protein 6.5 - 8.1 g/dL 7.4  8.1  7.9   Total Bilirubin 0.3 - 1.2 mg/dL 0.4  0.5  0.4   Alkaline Phos 38 - 126 U/L 94  106  82   AST 15 - 41 U/L _2 ALT 0 - 44 U/L _3 RADIOGRAPHIC STUDIES: I have personally reviewed the radiological images as listed and agreed with the findings in the report. No results found.    No orders of the defined types were placed in this encounter.  All questions were answered. The patient knows to call the clinic with any problems, questions or concerns. No barriers to learning was detected. The total time spent in the appointment was 30 minutes.     [name], MD 03/08/2022   I, Wilburn Mylar, am acting as scribe for [name], MD.   I have reviewed the above documentation for accuracy and completeness, and I agree with the above.

## 2022-03-09 LAB — CANCER ANTIGEN 27.29: CA 27.29: 66.2 U/mL — ABNORMAL HIGH (ref 0.0–38.6)

## 2022-03-11 ENCOUNTER — Inpatient Hospital Stay (HOSPITAL_BASED_OUTPATIENT_CLINIC_OR_DEPARTMENT_OTHER): Payer: Medicare Other | Admitting: Hematology

## 2022-03-11 DIAGNOSIS — Z171 Estrogen receptor negative status [ER-]: Secondary | ICD-10-CM | POA: Diagnosis not present

## 2022-03-11 DIAGNOSIS — C50512 Malignant neoplasm of lower-outer quadrant of left female breast: Secondary | ICD-10-CM | POA: Diagnosis not present

## 2022-03-11 NOTE — Progress Notes (Signed)
[place]   Telephone:([phone]) [phone] Fax:(336) [phone]   Clinic Follow up Note   Patient Care Team: [name], MD as PCP - General (Family Medicine) [name], MD as PCP - Cardiology (Cardiology) [name], MD as Consulting Physician (Hematology) [name], MD as Consulting Physician (General Surgery) [name], [facility] as Pharmacist (Pharmacist) Jacelyn Pi, MD as Referring Physician (Endocrinology)  Date of Service:  03/11/2022  I connected with Kristin Ward on 03/11/2022 at  8:00 AM EDT by telephone visit and verified that I am speaking with the correct person using two identifiers.  I discussed the limitations, risks, security and privacy concerns of performing an evaluation and management service by telephone and the availability of in person appointments. I also discussed with the patient that there may be a patient responsible charge related to this service. The patient expressed understanding and agreed to proceed.   Other persons participating in the visit and their role in the encounter:  none  Patient's location:  home Provider's location:  my office  CHIEF COMPLAINT: f/u of metastatic breast cancer  CURRENT THERAPY:  Enhertu, q3weeks, starting 02/15/22 -Zometa, q96month, starting 11/20/20  ASSESSMENT & PLAN:  Kristin Raffetyis a 66y.o. female with   1. Left breast invasive ductal carcinoma, stage IB, p(T1cN0M0), Triple negative, Grade 3, in 2018, metastatic disease in 08/2020 ER 20% weakly + PR/HER2 (0) negative, PD-L1 0%, genetics (-), HER2 1+ on node biopsy  -diagnosed with triple negative breast cancer in 05/2017. She is s/p left breast lumpectomy, adjuvant chemo TC and Radiation.  -Genetic testing was negative for mutations.  -Due to new right gluteal pain, she underwent biopsy on 09/02/20 which showed metastatic carcinoma, consistent with breast primary. ER 20% positive, PR and HER2 negative.  -she received 4 cycles  of Taxol 09/26/20 - 12/18/20, discontinued due to worsening neuropathy, then Xeloda from 01/01/21 - 06/2021. -she switched to Trodelvy 07/03/21 - 01/11/22 due to disease progression. Dose reduced due to nausea and diarrhea. Chemo delayed in 09/2021 due to uncontrolled hyperglycemia.  -biopsy of a right inguinal lymph node on 01/15/22 showed adenocarcinoma, consistent with metastatic breast carcinoma. Prognostic panel confirmed triple negative disease (Her2 1+ low expression).  -she received palliative radiation to the inguinal nodes under Dr. MLisbeth Renshaw6/19/23 - 02/08/22.  -new baseline CT CAP on 02/25/22 showed: mild improvement to right groin adenopathy; new/progressive upper abdominal adenopathy; increase in retroperitoneal stranding, R>L; stable right iliac bone lesion.  -She started Enhertu on 02/15/22, has significant nausea, low appetite, and fatigue which lasted over a week. She tolerated cycle 2 much better with added IV emend as pre-med.   2.  Treatment related side effects (nausea vomiting, back pain) -we added emend to her pre-medications with cycle 2; she was able to tolerate much better and did not take any oral antiemetics. She has zofran, compazine, and phenergan to use as needed.   3. Chronic combined systolic and diastolic heart failure -due to concerns for reversible cardiotoxicity with Enhertu, she was evaluated by Dr. BHaroldine Laws Echo on 01/31/22 showed EF 35-40%, GLS -12.9%, RV mildly reduced.  -will monitor with repeat echo 04/01/22   4. Diabetes and uncontrolled hyperglycemia -worsening hyperglycemia lately, probably related to dexa (before chemo) and Trodelvy  -her hyperglycemia is now better controlled on 25u Lantus and sliding scale insulin.   5. Bone metastases, Vit D deficiency -She had radiating right hip pain since 2020 from right iliac wing mass -She completed Palliative radiation  to right iliac 09/04/20 - 09/22/20 with Dr. Lisbeth Renshaw, and her pain is much improved.  -Given localized bone  met, she started Zometa q94month on 11/20/20. Most recent dose 02/15/22 -vit D has remained low, most recently 20.86 on 08/17/21.   6. G2 peripheral Neuropathy  -S/p C2D8 Taxol she developed mild tingling in her hands and feet. Taxol stopped after 12/18/20. Her neuropathy is improving.  -Overall stable   7. HTN, COPD, retinitis pigmentosa with limited vision -Continue to follow-up with PCP Dr LRedmond Schooland cardiologist Dr HEllyn Hack -she is legally blind -Blood pressure 148/82 on 03/08/22.   8. Goal of care discussion  -The patient understands the goal of care is palliative. -she is full code now      PLAN: -lab, flush, and cycle 3 Enhertu on 8/21  -echo 8/21   No problem-specific Assessment & Plan notes found for this encounter.   SUMMARY OF ONCOLOGIC HISTORY: Oncology History Overview Note  Cancer Staging Malignant neoplasm of lower-outer quadrant of left breast of female, estrogen receptor negative (HGonzales Staging form: Breast, AJCC 8th Edition - Clinical stage from 06/06/2017: Stage IB (cT1c, cN0, cM0, G3, ER-, PR-, HER2-) - Signed by FTruitt Merle MD on 06/15/2017 Nuclear grade: G3 Histologic grading system: 3 grade system - Pathologic stage from 06/26/2017: Stage IB (pT1c, pN0, cM0, G3, ER-, PR-, HER2-) - Signed by FTruitt Merle MD on 07/10/2017 Neoadjuvant therapy: No Nuclear grade: G3 Multigene prognostic tests performed: None Histologic grading system: 3 grade system Laterality: Left     Malignant neoplasm of lower-outer quadrant of left breast of female, estrogen receptor negative ([place]  05/30/2017 Mammogram   Diagnostic mammo and [place]PRESSION: 1. Highly suspicious 1.4 cm mass in the slightly lower slightly outer left breast -tissue sampling recommended. 2. Indeterminate 0.5 mm mass in the slightly lower slightly outer left breast -tissue sampling recommended. 3. At least 2 left axillary lymph nodes with borderline cortical thickness.   06/06/2017 Initial Biopsy    Diagnosis 1. Breast, left, needle core biopsy, 5:30 o'clock - INVASIVE DUCTAL CARCINOMA, G3 2. Lymph node, needle/core biopsy, left axillary - NO CARCINOMA IDENTIFIED IN ONE LYMPH NODE (0/1)   06/06/2017 Initial Diagnosis   Malignant neoplasm of lower-outer quadrant of left breast of female, estrogen receptor negative ([place]   06/06/2017 Receptors her2   Estrogen Receptor: 0%, NEGATIVE Progesterone Receptor: 0%, NEGATIVE Proliferation Marker Ki67: 70%   06/26/2017 Surgery   LEFT BREAST LUMPECTOMY WITH RADIOACTIVE SEED AND SENTINEL LYMPH NODE BIOPSY ERAS  PATHWAY AND INSERTION PORT-A-CATH By Dr. BBarry Dieneson 06/26/17    06/26/2017 Pathology Results   Diagnosis 06/26/17  1. Breast, lumpectomy, Left - INVASIVE DUCTAL CARCINOMA, GRADE III/III, SPANNING 1.2 CM. - THE SURGICAL RESECTION MARGINS ARE NEGATIVE FOR CARCINOMA. - SEE ONCOLOGY TABLE BELOW. 2. Lymph node, sentinel, biopsy, Left axillary #1 - THERE IS NO EVIDENCE OF CARCINOMA IN 1 OF 1 LYMPH NODE (0/1). 3. Lymph node, sentinel, biopsy, Left axillary #2 - THERE IS NO EVIDENCE OF CARCINOMA IN 1 OF 1 LYMPH NODE (0/1). 4. Lymph node, sentinel, biopsy, Left axillary #3 - THERE IS NO EVIDENCE OF CARCINOMA IN 1 OF 1 LYMPH NODE (0/1).    07/25/2017 - 09/29/2017 Chemotherapy   Adjuvant cytoxan and docetaxel (TC) every 3 weekd for 4 cycles     11/19/2017 - 12/17/2017 Radiation Therapy   Adjuvant breast radiation Left breast treated to 42.5 Gy with 17 fx of 2.5 Gy followed by a boost of 7.5 Gy with 3 fx of 2.5 Gy  02/2019 Procedure   She had PAC removal in 02/2019.    07/23/2020 Imaging   MRI Lumbar Spine 07/23/20 IMPRESSION: 1. No significant disc herniation, spinal canal or neural foraminal stenosis at any level. 2. Moderate facet degenerative changes at L3-4, L4-5 and L5-S1. 3. Multiple enlarged retroperitoneal and right iliac lymph nodes. Recommend correlation with CT of the abdomen and pelvis with contrast.   08/15/2020  Imaging   CT AP 08/15/20  IMPRESSION: 1. Right iliac and periaortic adenopathy is noted concerning for metastatic disease or lymphoma. Also noted is abnormal soft tissue mass anterior and posterior to the right iliac wing concerning for malignancy or metastatic disease. MRI is recommended for further evaluation. 2. Irregular lucency with sclerotic margins is seen involving the right iliac crest concerning for possible lytic lesion. MRI may be performed for further evaluation. 3. Enlarged fibroid uterus. 4. Small gallstone. 5. Aortic atherosclerosis.   08/22/2020 Pathology Results   FINAL MICROSCOPIC DIAGNOSIS: 08/22/20  A. NEEDLE CORE, RIGHT GLUTEUS MINIMUMUS, BIOPSY:  -  Metastatic carcinoma  -  See comment   COMMENT:  By immunohistochemistry, the neoplastic cells are positive for  cytokeratin-7 and have weak patchy positivity for GATA3 but are negative for cytokeratin-20 and GCDFP.  The combined morphology and immunophenotype, support metastasis from the patient's known breast primary carcinoma.   ADDENDUM:  PROGNOSTIC INDICATOR RESULTS:  The tumor cells are NEGATIVE for Her2 (0).  Estrogen Receptor:       POSITIVE, 20%, WEAK STAINING  Progesterone Receptor:   NEGATIVE   PD-L1 (0) negative    09/04/2020 - 09/22/2020 Radiation Therapy   Palliative radiation to right iliac 09/04/20 - 09/22/20, Dr. Lisbeth Renshaw   09/26/2020 -  Chemotherapy   first line weekly Taxol 3 weeks on/1 week off starting 09/26/20 --Reduced to 2 weeks on/1 week off starting with C3 due to neuropathy. Stopped after C4 on 12/18/20 due to neuratrophy.  --Switched to Xeloda on 01/01/21 at 1543m in the AM and 20060min the PM for 2 weeks on/1 week off     10/13/2020 Procedure    PAC placement on 10/13/20.    04/10/2021 Imaging   CT CAP  IMPRESSION: 1. Enlarging abdominopelvic retroperitoneal and right inguinal adenopathy, compatible with disease progression. 2. Lytic metastasis in the right iliac wing, similar. 3.  Hepatomegaly. 4. Cholelithiasis. 5. Enlarged fibroid uterus. 6.  Aortic atherosclerosis (ICD10-I70.0).   06/18/2021 Imaging   CT CAP  IMPRESSION: 1. Today's study demonstrates progression of metastatic disease as evidence by increased number and size of numerous enlarged lymph nodes in the retroperitoneum, along the right pelvic sidewall, and in the upper right thigh. 2. Osseous metastases in the bony pelvis appear similar to the prior examination. No definite new osseous lesions are otherwise noted. 3. Fibroid uterus again noted. 4. Cholelithiasis without evidence of acute cholecystitis. 5. Aortic atherosclerosis, in addition to left anterior descending coronary artery disease. Please note that although the presence of coronary artery calcium documents the presence of coronary artery disease, the severity of this disease and any potential stenosis cannot be assessed on this non-gated CT examination. Assessment for potential risk factor modification, dietary therapy or pharmacologic therapy may be warranted, if clinically indicated. 6. Additional incidental findings, as above.   06/20/2021 Imaging   Bone Scan  IMPRESSION: No osseous uptake suspicious for recurrent/progressive metastatic disease.   07/03/2021 - 01/11/2022 Chemotherapy   Patient is on Treatment Plan : BREAST METASTATIC Sacituzumab govitecan-hziy (TIvette Loyalq21d     09/27/2021 Imaging   EXAM:  CT CHEST, ABDOMEN, AND PELVIS WITH CONTRAST  IMPRESSION: 1. Marked interval improvement in previously demonstrated lymphadenopathy in the abdomen and pelvis. A few prominent right inguinal lymph nodes remain. 2. Stable osseous metastatic disease in the pelvis. 3. No evidence of local recurrence or other metastatic disease. 4. Stable incidental findings including cholelithiasis, uterine fibroids and Aortic Atherosclerosis (ICD10-I70.0).  ADDENDUM: Not mentioned in the original impression is a possible nonocclusive fibrin  sheath along the distal aspect of the Port-A-Cath.   09/27/2021 Imaging   EXAM: NUCLEAR MEDICINE WHOLE BODY BONE SCAN  IMPRESSION: Stable examination, without abnormal osseous uptake.   11/27/2021 Imaging   CT AP IMPRESSION: 1. Stable lytic and sclerotic lesion in the RIGHT iliac wing. 2. No evidence of visceral metastasis or adenopathy in the abdomenpelvis. 3. Leiomyomatous uterus.  ADDENDUM REPORT: 11/29/2021 09:33 Upon further review and discussion with Cira Rue NP, the RIGHT inguinal lymph nodes previous described have increased in volume. For example 14 mm node (image 91/2) is increased from 9 mm. Adjacent 14 mm node on image 86 is increased from 8 mm. Additionally, LEFT common iliac lymph node is also slightly increased measuring 10 mm (46/2) compared to 7 mm on comparison exam from 09/27/2021. These sites demonstrates similar but greater lymphadenopathy on CT 06/18/2021.   02/15/2022 -  Chemotherapy   Patient is on Treatment Plan : BREAST METASTATIC fam-trastuzumab deruxtecan-nxki (Enhertu) q21d        INTERVAL HISTORY:  Kristin Ward was contacted for a follow up of metastatic breast cancer. She was last seen by me on 03/08/22. She reports she tolerated this cycle better than the first. She reports her nausea is better, and she has not taken any oral antiemetics. She notes she is able to eat and drink well. She adds her bowels are moving slower than she's used to (baseline is 1 BM a day).   All other systems were reviewed with the patient and are negative.  MEDICAL HISTORY:  Past Medical History:  Diagnosis Date   Abnormal x-ray of lungs with single pulmonary nodule 03/15/2016   per records from [place]    Anemia    Asthma    Cholelithiasis 05/19/2017   On CT   Coronary artery calcification seen on CAT scan 05/19/2017   Diabetes mellitus without complication ([place])    type 2   Diabetes mellitus, new onset ([place]) 01/20/2019   Dilated idiopathic cardiomyopathy ([place])  12/2015   EF 15-20%. Diagnosed in [facility]   Headache    NONE RECENT   Hypertension    Lymph nodes enlarged 07/25/2020   Malignant neoplasm of lower-outer quadrant of left breast of female, estrogen receptor negative ([place]) 06/11/2017   left breast   Mixed hyperlipidemia 06/17/2018   partially blind    Personal history of chemotherapy 2018-2019   Personal history of radiation therapy 2019   Retinitis pigmentosa    Retroperitoneal lymphadenopathy 07/25/2020   Uveitis     SURGICAL HISTORY: Past Surgical History:  Procedure Laterality Date   brain cyst removed  2007   to help with headaches per patient , aspirated    BREAST BIOPSY Left 06/06/2017   x2   BREAST CYST ASPIRATION Left 06/06/2017   BREAST LUMPECTOMY Left 06/26/2017   BREAST LUMPECTOMY WITH RADIOACTIVE SEED AND SENTINEL LYMPH NODE BIOPSY Left 06/26/2017   Procedure: BREAST LUMPECTOMY WITH RADIOACTIVE SEED AND SENTINEL LYMPH NODE BIOPSY ERAS  PATHWAY;  Surgeon: [name], MD;  Location: [place];  Service: General;  Laterality: Left;  pec block   FINE NEEDLE ASPIRATION Left 02/23/2019   Procedure: FINE NEEDLE ASPIRATION;  Surgeon: [name], MD;  Location: [place];  Service: General;  Laterality: Left;  Asiration of left breast seroma    IR IMAGING GUIDED PORT INSERTION  10/13/2020   NASAL TURBINATE REDUCTION     PORT-A-CATH REMOVAL Right 02/23/2019   Procedure: REMOVAL PORT-A-CATH;  Surgeon: [name], MD;  Location: [place];  Service: General;  Laterality: Right;   PORTACATH PLACEMENT N/A 06/26/2017   Procedure: INSERTION PORT-A-CATH;  Surgeon: [name], MD;  Location: [place];  Service: General;  Laterality: N/A;   TRANSTHORACIC ECHOCARDIOGRAM  12/2015   A) [facility] May 2017: Mild concentric LVH. Global hypokinesis. GR daily. EF 18% severe LA dilation. Mitral annular dilatation with papillary muscle dysfunction and moderate MR. Dilated IVC  consistent with elevated RAP.   TRANSTHORACIC ECHOCARDIOGRAM  05/2017; 08/2017   a) Mild concentric LVH.  EF 45 to 50% with diffuse hypokinesis.  GR 1 DD.  Mild aortic root dilation.;; b)  Mild LVH EF 45 to 50%.  Diffuse HK.  No significant valve disease.  No significant change.    I have reviewed the social history and family history with the patient and they are unchanged from previous note.  ALLERGIES:  is allergic to morphine and related, latex, tylenol with codeine #3 [acetaminophen-codeine], and penicillins.  MEDICATIONS:  Current Outpatient Medications  Medication Sig Dispense Refill   Accu-Chek FastClix Lancets MISC TEST TWICE A DAY. PT USES AN ACCU-CHEK GUIDE ME METER 102 each 2   amLODipine (NORVASC) 5 MG tablet Take 1 tablet (5 mg total) by mouth daily. 30 tablet 3   aspirin 81 MG chewable tablet Chew 81 mg by mouth daily.     atorvastatin (LIPITOR) 20 MG tablet Take 1 tablet (20 mg total) by mouth daily. 90 tablet 3   brimonidine (ALPHAGAN) 0.2 % ophthalmic solution Place 1 drop into both eyes 2 (two) times daily.     carvedilol (COREG) 12.5 MG tablet Take 1 tablet (12.5 mg total) by mouth 2 (two) times daily with a meal. 180 tablet 1   cholecalciferol (VITAMIN D3) 25 MCG (1000 UNIT) tablet Take 2,000 Units by mouth daily.     Continuous Blood Gluc Receiver (DEXCOM G7 RECEIVER) DEVI 1 Device by Does not apply route 2 (two) times daily. 1 each 1   Continuous Blood Gluc Sensor (DEXCOM G7 SENSOR) MISC Use as directed, replace every 10 DAYS 3 each 10   furosemide (LASIX) 20 MG tablet TAKE 2 TABLETS (40 MG TOTAL) BY MOUTH AS NEEDED. FOR WEIGHT GAIN 3 TO 4 LBS. 90 tablet 3   HUMALOG KWIKPEN 100 UNIT/ML KwikPen Inject 3-10 Units into the skin 3 (three) times daily before meals. Per sliding scale     ibuprofen (ADVIL) 800 MG tablet Take 1 tablet (800 mg total) by mouth every 8 (eight) hours as needed. 30 tablet 2   insulin glargine (LANTUS) 100 UNIT/ML injection Inject 25 Units into the  skin at bedtime. 15 units in am  on chemo week     Lancets Misc. (ACCU-CHEK SOFTCLIX LANCET DEV) KIT 1 Units by Does not apply route 2 (two) times daily. Check FSBS BID - Include strips # 50 with 5 refills, Lancets #50 with 5 refills DX: Type 2 DM - ICD 10: E11.9 1 kit 0   lidocaine-prilocaine (EMLA) cream Apply 1 application topically as needed. 30 g 0   linaclotide (LINZESS) 72 MCG  capsule Take 72 mcg by mouth as needed.     metFORMIN (GLUCOPHAGE) 1000 MG tablet Take 1 tablet (1,000 mg total) by mouth 2 (two) times daily with a meal. 180 tablet 3   Multiple Vitamins-Minerals (WOMENS MULTI PO) Take 15 mLs by mouth daily.     ondansetron (ZOFRAN) 8 MG tablet Take 1 tablet (8 mg total) by mouth every 8 (eight) hours as needed for nausea or vomiting (begin on day 3 after chemo). 30 tablet 1   oxyCODONE (OXY IR/ROXICODONE) 5 MG immediate release tablet Take 1 tablet (5 mg total) by mouth every 8 (eight) hours as needed for severe pain. 60 tablet 0   oxyCODONE-acetaminophen (PERCOCET/ROXICET) 5-325 MG tablet Take 1 tablet by mouth every 6 (six) hours as needed for severe pain.     pantoprazole (PROTONIX) 40 MG tablet TAKE 1 TABLET BY MOUTH EVERY DAY 90 tablet 1   prochlorperazine (COMPAZINE) 10 MG tablet Take 1 tablet (10 mg total) by mouth every 6 (six) hours as needed (Nausea or vomiting). 30 tablet 1   promethazine (PHENERGAN) 25 MG tablet Take 1 tablet (25 mg total) by mouth every 6 (six) hours as needed for nausea or vomiting. 30 tablet 0   sacubitril-valsartan (ENTRESTO) 97-103 MG Take 1 tablet by mouth 2 (two) times daily. 180 tablet 3   Semaglutide (RYBELSUS) 7 MG TABS Take 7 mg by mouth daily. 90 tablet 3   spironolactone (ALDACTONE) 25 MG tablet Take 25 mg by mouth daily.     vitamin B-12 (CYANOCOBALAMIN) 1000 MCG tablet Take 1,000 mcg by mouth daily.     vitamin C (ASCORBIC ACID) 500 MG tablet Take 500 mg by mouth in the morning, at noon, in the evening, and at bedtime.     zinc gluconate 50  MG tablet Take 50 mg by mouth daily.     Current Facility-Administered Medications  Medication Dose Route Frequency Provider Last Rate Last Admin   insulin glulisine (APIDRA) injection 6 Units  6 Units Subcutaneous Once Francis Gaines B, PA-C        PHYSICAL EXAMINATION: ECOG PERFORMANCE STATUS: 1 - Symptomatic but completely ambulatory  There were no vitals filed for this visit. Wt Readings from Last 3 Encounters:  03/08/22 225 lb 3 oz (102.1 kg)  02/21/22 213 lb 1.6 oz (96.7 kg)  02/15/22 214 lb (97.1 kg)     No vitals taken today, Exam not performed today  LABORATORY DATA:  I have reviewed the data as listed    Latest Ref Rng & Units 03/08/2022   10:24 AM 02/21/2022    8:19 AM 02/08/2022    8:12 AM  CBC  WBC 4.0 - 10.5 K/uL 4.1  4.5  4.5   Hemoglobin 12.0 - 15.0 g/dL 9.8  9.8  10.1   Hematocrit 36.0 - 46.0 % 30.7  29.8  31.2   Platelets 150 - 400 K/uL 362  199  262         Latest Ref Rng & Units 03/08/2022   10:24 AM 02/21/2022    8:19 AM 02/08/2022    8:12 AM  CMP  Glucose 70 - 99 mg/dL 175  166  114   BUN 8 - 23 mg/dL _0 Creatinine 0.44 - 1.00 mg/dL 0.75  0.72  0.70   Sodium 135 - 145 mmol/L 142  135  141   Potassium 3.5 - 5.1 mmol/L 3.9  3.8  4.2   Chloride 98 - 111 mmol/L 110  106  110   CO2 22 - 32 mmol/L _0 Calcium 8.9 - 10.3 mg/dL 8.7  9.2  9.5   Total Protein 6.5 - 8.1 g/dL 7.4  8.1  7.9   Total Bilirubin 0.3 - 1.2 mg/dL 0.4  0.5  0.4   Alkaline Phos 38 - 126 U/L 94  106  82   AST 15 - 41 U/L _1 ALT 0 - 44 U/L _2 RADIOGRAPHIC STUDIES: I have personally reviewed the radiological images as listed and agreed with the findings in the report. No results found.    No orders of the defined types were placed in this encounter.  All questions were answered. The patient knows to call the clinic with any problems, questions or concerns. No barriers to learning was detected. The total time spent in the appointment  was 8 minutes.     [name], MD 03/11/2022   I, Wilburn Mylar, am acting as scribe for [name], MD.   I have reviewed the above documentation for accuracy and completeness, and I agree with the above.

## 2022-03-12 ENCOUNTER — Other Ambulatory Visit: Payer: Self-pay

## 2022-03-12 ENCOUNTER — Telehealth: Payer: Self-pay | Admitting: Hematology

## 2022-03-12 NOTE — Telephone Encounter (Signed)
Unable to leave message with follow-up appointment per 7/28 los. Mailed calendar.

## 2022-03-18 ENCOUNTER — Telehealth: Payer: Self-pay | Admitting: Pharmacist

## 2022-03-18 NOTE — Progress Notes (Addendum)
Chronic Care Management Pharmacy Assistant   Reason for Encounter: Disease State   Conditions to be addressed/monitored: DMII  Recent office visits:  11/26/21 [name] - Patient presented for Uncontrolled type 2 diabetes mellitus with hyperglycemia and other concerns. No medication changes.  Recent consult visits:  03/08/22 Patient presented to [facility] for Goals of care counseling/discussion and other concerns.  03/08/22 Patient presented to [facility] for Oncology Treatment.  02/21/22  Patient presented to [facility] for Oncology Treatment.  02/15/22 Patient presented to [facility] for Goals of care counseling/discussion and other concerns.  02/08/22 Patient presented to [facility] for Weekly under Treatment Check.  01/11/22 Benay Pike, MD (Oncology) - Patient presented for Malignant neoplasm of lower-outer quadrant of left breast of female, estrogen receptor negative and other concerns.  01/01/22 Patient presented to [facility] for Reconsult.   01/01/22 Patient presented to [facility] for Nurse Evaluation.  12/21/21 [name], MD (Oncology) - Patient presented for Malignant neoplasm of lower-outer quadrant of left breast of female, estrogen receptor negative. Decreased Oxycodone to 5 mg.  11/29/21 Alla Feeling, NP (Oncology) - Patient presented for for Malignant neoplasm of lower-outer quadrant of left breast of female, estrogen receptor negative. No medication changes.  11/23/21 Balan, Bindubal (Endo) - Patient presented for Type 2 diabetes mellitus with hyperglycemia and other concerns. No other visit details available.  11/21/21 [name], DPM (Podiatry) - Patient presented for Pain due to onychomycosis of toenails of both feet and other concerns. No  medication changes.  11/08/2021 [name], MD (Oncology) - Patient presented for Malignant neoplasm of lower-outer quadrant of left breast of female, estrogen receptor negative. No medication changes.   Hospital visits:  None in previous 6 months  Medications: Outpatient Encounter Medications as of 03/18/2022  Medication Sig   Accu-Chek FastClix Lancets MISC TEST TWICE A DAY. PT USES AN ACCU-CHEK GUIDE ME METER   amLODipine (NORVASC) 5 MG tablet Take 1 tablet (5 mg total) by mouth daily.   aspirin 81 MG chewable tablet Chew 81 mg by mouth daily.   atorvastatin (LIPITOR) 20 MG tablet Take 1 tablet (20 mg total) by mouth daily.   brimonidine (ALPHAGAN) 0.2 % ophthalmic solution Place 1 drop into both eyes 2 (two) times daily.   carvedilol (COREG) 12.5 MG tablet Take 1 tablet (12.5 mg total) by mouth 2 (two) times daily with a meal.   cholecalciferol (VITAMIN D3) 25 MCG (1000 UNIT) tablet Take 2,000 Units by mouth daily.   Continuous Blood Gluc Receiver (DEXCOM G7 RECEIVER) DEVI 1 Device by Does not apply route 2 (two) times daily.   Continuous Blood Gluc Sensor (DEXCOM G7 SENSOR) MISC Use as directed, replace every 10 DAYS   furosemide (LASIX) 20 MG tablet TAKE 2 TABLETS (40 MG TOTAL) BY MOUTH AS NEEDED. FOR WEIGHT GAIN 3 TO 4 LBS.   HUMALOG KWIKPEN 100 UNIT/ML KwikPen Inject 3-10 Units into the skin 3 (three) times daily before meals. Per sliding scale   ibuprofen (ADVIL) 800 MG tablet Take 1 tablet (800 mg total) by mouth every 8 (eight) hours as needed.   insulin glargine (LANTUS) 100 UNIT/ML injection Inject 25 Units into the skin at bedtime. 15 units in am  on chemo week   Lancets Misc. (ACCU-CHEK SOFTCLIX LANCET DEV) KIT 1 Units by Does not  apply route 2 (two) times daily. Check FSBS BID - Include strips # 50 with 5 refills, Lancets #50 with 5 refills DX: Type 2 DM - ICD 10: E11.9   lidocaine-prilocaine (EMLA) cream Apply 1 application topically as needed.   linaclotide (LINZESS) 72 MCG  capsule Take 72 mcg by mouth as needed.   metFORMIN (GLUCOPHAGE) 1000 MG tablet Take 1 tablet (1,000 mg total) by mouth 2 (two) times daily with a meal.   Multiple Vitamins-Minerals (WOMENS MULTI PO) Take 15 mLs by mouth daily.   ondansetron (ZOFRAN) 8 MG tablet Take 1 tablet (8 mg total) by mouth every 8 (eight) hours as needed for nausea or vomiting (begin on day 3 after chemo).   oxyCODONE (OXY IR/ROXICODONE) 5 MG immediate release tablet Take 1 tablet (5 mg total) by mouth every 8 (eight) hours as needed for severe pain.   oxyCODONE-acetaminophen (PERCOCET/ROXICET) 5-325 MG tablet Take 1 tablet by mouth every 6 (six) hours as needed for severe pain.   pantoprazole (PROTONIX) 40 MG tablet TAKE 1 TABLET BY MOUTH EVERY DAY   prochlorperazine (COMPAZINE) 10 MG tablet Take 1 tablet (10 mg total) by mouth every 6 (six) hours as needed (Nausea or vomiting).   promethazine (PHENERGAN) 25 MG tablet Take 1 tablet (25 mg total) by mouth every 6 (six) hours as needed for nausea or vomiting.   sacubitril-valsartan (ENTRESTO) 97-103 MG Take 1 tablet by mouth 2 (two) times daily.   Semaglutide (RYBELSUS) 7 MG TABS Take 7 mg by mouth daily.   spironolactone (ALDACTONE) 25 MG tablet Take 25 mg by mouth daily.   vitamin B-12 (CYANOCOBALAMIN) 1000 MCG tablet Take 1,000 mcg by mouth daily.   vitamin C (ASCORBIC ACID) 500 MG tablet Take 500 mg by mouth in the morning, at noon, in the evening, and at bedtime.   zinc gluconate 50 MG tablet Take 50 mg by mouth daily.   Facility-Administered Encounter Medications as of 03/18/2022  Medication   insulin glulisine (APIDRA) injection 6 Units   Recent Relevant Labs: Lab Results  Component Value Date/Time   HGBA1C 12.0 (A) 10/12/2021 10:11 AM   HGBA1C 6.8 (H) 07/12/2021 11:42 AM   HGBA1C 8.6 (H) 04/17/2021 10:39 AM    Kidney Function Lab Results  Component Value Date/Time   CREATININE 0.75 03/08/2022 10:24 AM   CREATININE 0.72 02/21/2022 08:19 AM   CREATININE  0.81 05/07/2021 09:12 AM   CREATININE 0.74 01/29/2021 09:22 AM   CREATININE 0.9 08/01/2017 08:02 AM   CREATININE 0.9 07/25/2017 08:09 AM   GFR 80.41 06/23/2020 10:04 AM   GFRNONAA >60 03/08/2022 10:24 AM   GFRNONAA >60 05/07/2021 09:12 AM   GFRNONAA 84 03/20/2017 09:43 AM   GFRAA >60 02/16/2020 09:00 AM   GFRAA >89 03/20/2017 09:43 AM    Current antihyperglycemic regimen:  Rybelsus 7 mg 1 tablet daily - Appropriate, Query effective, Safe, Accessible Lantus inject 10 units daily - Appropriate, Query effective, Safe, Accessible Metformin 1000 mg 1 tablet twice daily - Appropriate, Query effective, Safe, Accessible Unable to reach for assessment  Care Gaps: Bp- 148/82 03/08/22 AWV 6/22 Lab Results  Component Value Date   HGBA1C 12.0 (A) 10/12/2021   Per [facility] Call to Endo to have most recent visit/labs sent over via fax to [facility].  Star Rating Drugs: Atorvastatin 20 mg - Last filled 01/16/22 90 DS at [facility] Metformin 1000 mg - Last filled 02/13/22 90 DS at [facility] Semaglutide 7 mg - Last filled 02/13/22 90 DS at [facility]  Laresia Green CMA Clinical Pharmacist Assistant [phone]  

## 2022-03-27 ENCOUNTER — Other Ambulatory Visit: Payer: Self-pay | Admitting: Physician Assistant

## 2022-03-27 DIAGNOSIS — I10 Essential (primary) hypertension: Secondary | ICD-10-CM

## 2022-03-27 DIAGNOSIS — I5042 Chronic combined systolic (congestive) and diastolic (congestive) heart failure: Secondary | ICD-10-CM

## 2022-03-28 MED FILL — Dexamethasone Sodium Phosphate Inj 100 MG/10ML: INTRAMUSCULAR | Qty: 1 | Status: AC

## 2022-03-28 MED FILL — Fosaprepitant Dimeglumine For IV Infusion 150 MG (Base Eq): INTRAVENOUS | Qty: 5 | Status: AC

## 2022-03-29 ENCOUNTER — Inpatient Hospital Stay: Payer: Medicare Other | Attending: Hematology

## 2022-03-29 ENCOUNTER — Inpatient Hospital Stay: Payer: Medicare Other

## 2022-03-29 ENCOUNTER — Other Ambulatory Visit: Payer: Self-pay

## 2022-03-29 ENCOUNTER — Inpatient Hospital Stay (HOSPITAL_BASED_OUTPATIENT_CLINIC_OR_DEPARTMENT_OTHER): Payer: Medicare Other | Admitting: Hematology

## 2022-03-29 ENCOUNTER — Encounter: Payer: Self-pay | Admitting: Hematology

## 2022-03-29 VITALS — BP 146/93 | HR 84 | Temp 97.2°F | Resp 18 | Wt 218.4 lb

## 2022-03-29 DIAGNOSIS — C7951 Secondary malignant neoplasm of bone: Secondary | ICD-10-CM | POA: Insufficient documentation

## 2022-03-29 DIAGNOSIS — Z171 Estrogen receptor negative status [ER-]: Secondary | ICD-10-CM

## 2022-03-29 DIAGNOSIS — Z5112 Encounter for antineoplastic immunotherapy: Secondary | ICD-10-CM | POA: Insufficient documentation

## 2022-03-29 DIAGNOSIS — Z170 Estrogen receptor positive status [ER+]: Secondary | ICD-10-CM | POA: Insufficient documentation

## 2022-03-29 DIAGNOSIS — I5042 Chronic combined systolic (congestive) and diastolic (congestive) heart failure: Secondary | ICD-10-CM | POA: Insufficient documentation

## 2022-03-29 DIAGNOSIS — C50512 Malignant neoplasm of lower-outer quadrant of left female breast: Secondary | ICD-10-CM

## 2022-03-29 DIAGNOSIS — Z95828 Presence of other vascular implants and grafts: Secondary | ICD-10-CM

## 2022-03-29 DIAGNOSIS — Z7189 Other specified counseling: Secondary | ICD-10-CM | POA: Diagnosis not present

## 2022-03-29 DIAGNOSIS — E1165 Type 2 diabetes mellitus with hyperglycemia: Secondary | ICD-10-CM | POA: Insufficient documentation

## 2022-03-29 DIAGNOSIS — I110 Hypertensive heart disease with heart failure: Secondary | ICD-10-CM | POA: Insufficient documentation

## 2022-03-29 DIAGNOSIS — E559 Vitamin D deficiency, unspecified: Secondary | ICD-10-CM | POA: Diagnosis not present

## 2022-03-29 DIAGNOSIS — G629 Polyneuropathy, unspecified: Secondary | ICD-10-CM | POA: Diagnosis not present

## 2022-03-29 LAB — CMP (CANCER CENTER ONLY)
ALT: 13 U/L (ref 0–44)
AST: 24 U/L (ref 15–41)
Albumin: 4.1 g/dL (ref 3.5–5.0)
Alkaline Phosphatase: 77 U/L (ref 38–126)
Anion gap: 3 — ABNORMAL LOW (ref 5–15)
BUN: 11 mg/dL (ref 8–23)
CO2: 27 mmol/L (ref 22–32)
Calcium: 9.1 mg/dL (ref 8.9–10.3)
Chloride: 112 mmol/L — ABNORMAL HIGH (ref 98–111)
Creatinine: 0.74 mg/dL (ref 0.44–1.00)
GFR, Estimated: >60 mL/min (ref >60)
Glucose, Bld: 103 mg/dL — ABNORMAL HIGH (ref 70–99)
Potassium: 4 mmol/L (ref 3.5–5.1)
Sodium: 142 mmol/L (ref 135–145)
Total Bilirubin: 0.4 mg/dL (ref 0.3–1.2)
Total Protein: 7.5 g/dL (ref 6.5–8.1)

## 2022-03-29 LAB — CBC WITH DIFFERENTIAL (CANCER CENTER ONLY)
Abs Immature Granulocytes: 0.07 10*3/uL (ref 0.00–0.07)
Basophils Absolute: 0 10*3/uL (ref 0.0–0.1)
Basophils Relative: 1 %
Eosinophils Absolute: 0.2 10*3/uL (ref 0.0–0.5)
Eosinophils Relative: 5 %
HCT: 32.1 % — ABNORMAL LOW (ref 36.0–46.0)
Hemoglobin: 10.4 g/dL — ABNORMAL LOW (ref 12.0–15.0)
Immature Granulocytes: 2 %
Lymphocytes Relative: 16 %
Lymphs Abs: 0.7 10*3/uL (ref 0.7–4.0)
MCH: 28.9 pg (ref 26.0–34.0)
MCHC: 32.4 g/dL (ref 30.0–36.0)
MCV: 89.2 fL (ref 80.0–100.0)
Monocytes Absolute: 0.7 10*3/uL (ref 0.1–1.0)
Monocytes Relative: 15 %
Neutro Abs: 2.7 10*3/uL (ref 1.7–7.7)
Neutrophils Relative %: 61 %
Platelet Count: 376 10*3/uL (ref 150–400)
RBC: 3.6 MIL/uL — ABNORMAL LOW (ref 3.87–5.11)
RDW: 16.2 % — ABNORMAL HIGH (ref 11.5–15.5)
WBC Count: 4.4 10*3/uL (ref 4.0–10.5)
nRBC: 0 % (ref 0.0–0.2)

## 2022-03-29 MED ORDER — ACETAMINOPHEN 325 MG PO TABS
650.0000 mg | ORAL_TABLET | Freq: Once | ORAL | Status: AC
  Administered 2022-03-29: 650 mg via ORAL
  Filled 2022-03-29: qty 2

## 2022-03-29 MED ORDER — SODIUM CHLORIDE 0.9% FLUSH: 10.0000 mL | INTRAVENOUS | Status: DC | PRN

## 2022-03-29 MED ORDER — SODIUM CHLORIDE 0.9% FLUSH
10.0000 mL | Freq: Once | INTRAVENOUS | Status: AC | PRN
  Administered 2022-03-29: 10 mL

## 2022-03-29 MED ORDER — PALONOSETRON HCL INJECTION 0.25 MG/5ML
0.2500 mg | Freq: Once | INTRAVENOUS | Status: AC
  Administered 2022-03-29: 0.25 mg via INTRAVENOUS
  Filled 2022-03-29: qty 5

## 2022-03-29 MED ORDER — DIPHENHYDRAMINE HCL 25 MG PO CAPS
50.0000 mg | ORAL_CAPSULE | Freq: Once | ORAL | Status: AC
  Administered 2022-03-29: 50 mg via ORAL
  Filled 2022-03-29: qty 2

## 2022-03-29 MED ORDER — HEPARIN SOD (PORK) LOCK FLUSH 100 UNIT/ML IV SOLN
500.0000 [IU] | Freq: Once | INTRAVENOUS | Status: AC | PRN
  Administered 2022-03-29: 500 [IU]

## 2022-03-29 MED ORDER — SODIUM CHLORIDE 0.9% FLUSH
10.0000 mL | INTRAVENOUS | Status: DC | PRN
  Administered 2022-03-29: 10 mL

## 2022-03-29 MED ORDER — FAM-TRASTUZUMAB DERUXTECAN-NXKI CHEMO 100 MG IV SOLR
5.0500 mg/kg | Freq: Once | INTRAVENOUS | Status: AC
  Administered 2022-03-29: 500 mg via INTRAVENOUS
  Filled 2022-03-29: qty 25

## 2022-03-29 MED ORDER — SODIUM CHLORIDE 0.9 % IV SOLN
150.0000 mg | Freq: Once | INTRAVENOUS | Status: AC
  Administered 2022-03-29: 150 mg via INTRAVENOUS
  Filled 2022-03-29: qty 150

## 2022-03-29 MED ORDER — DEXTROSE 5 % IV SOLN: Freq: Once | INTRAVENOUS | Status: AC

## 2022-03-29 MED ORDER — SODIUM CHLORIDE 0.9 % IV SOLN
10.0000 mg | Freq: Once | INTRAVENOUS | Status: AC
  Administered 2022-03-29: 10 mg via INTRAVENOUS
  Filled 2022-03-29: qty 10

## 2022-03-29 NOTE — Progress Notes (Signed)
[place]   Telephone:([phone]) [phone] Fax:(336) [phone]   Clinic Follow up Note   Patient Care Team: [name], MD as PCP - General (Family Medicine) [name], MD as PCP - Cardiology (Cardiology) [name], MD as Consulting Physician (Hematology) [name], MD as Consulting Physician (General Surgery) [name], [facility] as Pharmacist (Pharmacist) Jacelyn Pi, MD as Referring Physician (Endocrinology)  Date of Service:  03/29/2022  CHIEF COMPLAINT: f/u of metastatic breast cancer  CURRENT THERAPY:  Enhertu, q3weeks, starting 02/15/22 -Zometa, q32month, starting 11/20/20  ASSESSMENT & PLAN:  Kristin Ward a 66y.o. female with   1. Left breast invasive ductal carcinoma, stage IB, p(T1cN0M0), Triple negative, Grade 3, in 2018, metastatic disease in 08/2020 ER 20% weakly + PR/HER2 (0) negative, PD-L1 0%, genetics (-), HER2 1+ on node biopsy  -diagnosed with triple negative breast cancer in 05/2017. She is s/p left breast lumpectomy, adjuvant chemo TC and Radiation.  -Genetic testing was negative for mutations.  -Due to new right gluteal pain, she underwent biopsy on 09/02/20 which showed metastatic carcinoma, consistent with breast primary. ER 20% positive, PR and HER2 negative.  -she received 4 cycles of Taxol 09/26/20 - 12/18/20, discontinued due to worsening neuropathy, then Xeloda from 01/01/21 - 06/2021. -she switched to Trodelvy 07/03/21 - 01/11/22 due to disease progression. Dose reduced due to nausea and diarrhea. Chemo delayed in 09/2021 due to uncontrolled hyperglycemia.  -biopsy of a right inguinal lymph node on 01/15/22 showed adenocarcinoma, consistent with metastatic breast carcinoma. Prognostic panel confirmed triple negative disease (Her2 1+ low expression).  -she received palliative radiation to the inguinal nodes under Dr. MLisbeth Renshaw6/19/23 - 02/08/22.  -new baseline CT CAP on 02/25/22 showed: mild improvement to right groin adenopathy;  new/progressive upper abdominal adenopathy; increase in retroperitoneal stranding, R>L; stable right iliac bone lesion.  -She started Enhertu on 02/15/22, has significant nausea, low appetite, and fatigue which lasted over a week. She tolerated cycle 2 much better with added IV emend as pre-med and using compazine at home  -She is clinically doing well, exam showed much decreased size of her right inguinal lymph nodes, indicating clinical response -She tolerated treatment well, lab is pending, will proceed with treatment if labs are adequate. -Plan to repeat a scan after 4 cycles of treatment   2.  Treatment related side effects (nausea vomiting, back pain) -we added emend to her pre-medications with cycle 2; she was able to tolerate much better and did not take any oral antiemetics. She has zofran, compazine, and phenergan to use as needed.   3. Chronic combined systolic and diastolic heart failure -due to concerns for reversible cardiotoxicity with Enhertu, she was evaluated by Dr. BHaroldine Laws Echo on 01/31/22 showed EF 35-40%, GLS -12.9%, RV mildly reduced.  -will monitor with repeat echo 04/01/22 -No clinical concern for fluid overload or CHF patient   4. Diabetes and uncontrolled hyperglycemia -worsening hyperglycemia lately, probably related to dexa (before chemo) and Trodelvy  -her hyperglycemia is now better controlled on 25u Lantus and sliding scale insulin.   5. Bone metastases, Vit D deficiency -She had radiating right hip pain since 2020 from right iliac wing mass -She completed Palliative radiation to right iliac 09/04/20 - 09/22/20 with Dr. MLisbeth Renshaw and her pain is much improved.  -Given localized bone met, she started Zometa q341monthon 11/20/20. Most recent dose 02/15/22 -vit D has remained low, most recently 20.86 on 08/17/21.   6. G2 peripheral Neuropathy  -S/p C2D8  Taxol she developed mild tingling in her hands and feet. Taxol stopped after 12/18/20. Her neuropathy is improving.   -Overall stable   7. HTN, COPD, retinitis pigmentosa with limited vision -Continue to follow-up with PCP Dr Redmond School and cardiologist Dr Ellyn Hack  -she is legally blind -Blood pressure 148/82 on 03/08/22.   8. Goal of care discussion  -The patient understands the goal of care is palliative. -she is full code now      PLAN: -proceed with cycle 3 Enhertu today at same dose if lab adequate  -echo 8/21 and f/u with cardiology  -lab, flush, f/u, and Enhertu in 3 and 6 weeks   No problem-specific Assessment & Plan notes found for this encounter.   SUMMARY OF ONCOLOGIC HISTORY: Oncology History Overview Note  Cancer Staging Malignant neoplasm of lower-outer quadrant of left breast of female, estrogen receptor negative ([place]) Staging form: Breast, AJCC 8th Edition - Clinical stage from 06/06/2017: Stage IB (cT1c, cN0, cM0, G3, ER-, PR-, HER2-) - Signed by [name], MD on 06/15/2017 Nuclear grade: G3 Histologic grading system: 3 grade system - Pathologic stage from 06/26/2017: Stage IB (pT1c, pN0, cM0, G3, ER-, PR-, HER2-) - Signed by [name], MD on 07/10/2017 Neoadjuvant therapy: No Nuclear grade: G3 Multigene prognostic tests performed: None Histologic grading system: 3 grade system Laterality: Left     Malignant neoplasm of lower-outer quadrant of left breast of female, estrogen receptor negative ([place])  05/30/2017 Mammogram   Diagnostic mammo and US IMPRESSION: 1. Highly suspicious 1.4 cm mass in the slightly lower slightly outer left breast -tissue sampling recommended. 2. Indeterminate 0.5 mm mass in the slightly lower slightly outer left breast -tissue sampling recommended. 3. At least 2 left axillary lymph nodes with borderline cortical thickness.   06/06/2017 Initial Biopsy   Diagnosis 1. Breast, left, needle core biopsy, 5:30 o'clock - INVASIVE DUCTAL CARCINOMA, G3 2. Lymph node, needle/core biopsy, left axillary - NO CARCINOMA IDENTIFIED IN ONE LYMPH NODE  (0/1)   06/06/2017 Initial Diagnosis   Malignant neoplasm of lower-outer quadrant of left breast of female, estrogen receptor negative ([place])   06/06/2017 Receptors her2   Estrogen Receptor: 0%, NEGATIVE Progesterone Receptor: 0%, NEGATIVE Proliferation Marker Ki67: 70%   06/26/2017 Surgery   LEFT BREAST LUMPECTOMY WITH RADIOACTIVE SEED AND SENTINEL LYMPH NODE BIOPSY ERAS  PATHWAY AND INSERTION PORT-A-CATH By Dr. Barry Dienes on 06/26/17    06/26/2017 Pathology Results   Diagnosis 06/26/17  1. Breast, lumpectomy, Left - INVASIVE DUCTAL CARCINOMA, GRADE III/III, SPANNING 1.2 CM. - THE SURGICAL RESECTION MARGINS ARE NEGATIVE FOR CARCINOMA. - SEE ONCOLOGY TABLE BELOW. 2. Lymph node, sentinel, biopsy, Left axillary #1 - THERE IS NO EVIDENCE OF CARCINOMA IN 1 OF 1 LYMPH NODE (0/1). 3. Lymph node, sentinel, biopsy, Left axillary #2 - THERE IS NO EVIDENCE OF CARCINOMA IN 1 OF 1 LYMPH NODE (0/1). 4. Lymph node, sentinel, biopsy, Left axillary #3 - THERE IS NO EVIDENCE OF CARCINOMA IN 1 OF 1 LYMPH NODE (0/1).    07/25/2017 - 09/29/2017 Chemotherapy   Adjuvant cytoxan and docetaxel (TC) every 3 weekd for 4 cycles     11/19/2017 - 12/17/2017 Radiation Therapy   Adjuvant breast radiation Left breast treated to 42.5 Gy with 17 fx of 2.5 Gy followed by a boost of 7.5 Gy with 3 fx of 2.5 Gy      02/2019 Procedure   She had PAC removal in 02/2019.    07/23/2020 Imaging   MRI Lumbar Spine 07/23/20 IMPRESSION: 1. No significant  disc herniation, spinal canal or neural foraminal stenosis at any level. 2. Moderate facet degenerative changes at L3-4, L4-5 and L5-S1. 3. Multiple enlarged retroperitoneal and right iliac lymph nodes. Recommend correlation with CT of the abdomen and pelvis with contrast.   08/15/2020 Imaging   CT AP 08/15/20  IMPRESSION: 1. Right iliac and periaortic adenopathy is noted concerning for metastatic disease or lymphoma. Also noted is abnormal soft tissue mass anterior and  posterior to the right iliac wing concerning for malignancy or metastatic disease. MRI is recommended for further evaluation. 2. Irregular lucency with sclerotic margins is seen involving the right iliac crest concerning for possible lytic lesion. MRI may be performed for further evaluation. 3. Enlarged fibroid uterus. 4. Small gallstone. 5. Aortic atherosclerosis.   08/22/2020 Pathology Results   FINAL MICROSCOPIC DIAGNOSIS: 08/22/20  A. NEEDLE CORE, RIGHT GLUTEUS MINIMUMUS, BIOPSY:  -  Metastatic carcinoma  -  See comment   COMMENT:  By immunohistochemistry, the neoplastic cells are positive for  cytokeratin-7 and have weak patchy positivity for GATA3 but are negative for cytokeratin-20 and GCDFP.  The combined morphology and immunophenotype, support metastasis from the patient's known breast primary carcinoma.   ADDENDUM:  PROGNOSTIC INDICATOR RESULTS:  The tumor cells are NEGATIVE for Her2 (0).  Estrogen Receptor:       POSITIVE, 20%, WEAK STAINING  Progesterone Receptor:   NEGATIVE   PD-L1 (0) negative    09/04/2020 - 09/22/2020 Radiation Therapy   Palliative radiation to right iliac 09/04/20 - 09/22/20, Dr. Lisbeth Renshaw   09/26/2020 -  Chemotherapy   first line weekly Taxol 3 weeks on/1 week off starting 09/26/20 --Reduced to 2 weeks on/1 week off starting with C3 due to neuropathy. Stopped after C4 on 12/18/20 due to neuratrophy.  --Switched to Xeloda on 01/01/21 at 1519m in the AM and 200101min the PM for 2 weeks on/1 week off     10/13/2020 Procedure    PAC placement on 10/13/20.    04/10/2021 Imaging   CT CAP  IMPRESSION: 1. Enlarging abdominopelvic retroperitoneal and right inguinal adenopathy, compatible with disease progression. 2. Lytic metastasis in the right iliac wing, similar. 3. Hepatomegaly. 4. Cholelithiasis. 5. Enlarged fibroid uterus. 6.  Aortic atherosclerosis (ICD10-I70.0).   06/18/2021 Imaging   CT CAP  IMPRESSION: 1. Today's study demonstrates progression  of metastatic disease as evidence by increased number and size of numerous enlarged lymph nodes in the retroperitoneum, along the right pelvic sidewall, and in the upper right thigh. 2. Osseous metastases in the bony pelvis appear similar to the prior examination. No definite new osseous lesions are otherwise noted. 3. Fibroid uterus again noted. 4. Cholelithiasis without evidence of acute cholecystitis. 5. Aortic atherosclerosis, in addition to left anterior descending coronary artery disease. Please note that although the presence of coronary artery calcium documents the presence of coronary artery disease, the severity of this disease and any potential stenosis cannot be assessed on this non-gated CT examination. Assessment for potential risk factor modification, dietary therapy or pharmacologic therapy may be warranted, if clinically indicated. 6. Additional incidental findings, as above.   06/20/2021 Imaging   Bone Scan  IMPRESSION: No osseous uptake suspicious for recurrent/progressive metastatic disease.   07/03/2021 - 01/11/2022 Chemotherapy   Patient is on Treatment Plan : BREAST METASTATIC Sacituzumab govitecan-hziy (TIvette Loyalq21d     09/27/2021 Imaging   EXAM: CT CHEST, ABDOMEN, AND PELVIS WITH CONTRAST  IMPRESSION: 1. Marked interval improvement in previously demonstrated lymphadenopathy in the abdomen and pelvis. A few prominent  right inguinal lymph nodes remain. 2. Stable osseous metastatic disease in the pelvis. 3. No evidence of local recurrence or other metastatic disease. 4. Stable incidental findings including cholelithiasis, uterine fibroids and Aortic Atherosclerosis (ICD10-I70.0).  ADDENDUM: Not mentioned in the original impression is a possible nonocclusive fibrin sheath along the distal aspect of the Port-A-Cath.   09/27/2021 Imaging   EXAM: NUCLEAR MEDICINE WHOLE BODY BONE SCAN  IMPRESSION: Stable examination, without abnormal osseous uptake.    11/27/2021 Imaging   CT AP IMPRESSION: 1. Stable lytic and sclerotic lesion in the RIGHT iliac wing. 2. No evidence of visceral metastasis or adenopathy in the abdomenpelvis. 3. Leiomyomatous uterus.  ADDENDUM REPORT: 11/29/2021 09:33 Upon further review and discussion with Cira Rue NP, the RIGHT inguinal lymph nodes previous described have increased in volume. For example 14 mm node (image 91/2) is increased from 9 mm. Adjacent 14 mm node on image 86 is increased from 8 mm. Additionally, LEFT common iliac lymph node is also slightly increased measuring 10 mm (46/2) compared to 7 mm on comparison exam from 09/27/2021. These sites demonstrates similar but greater lymphadenopathy on CT 06/18/2021.   02/15/2022 - 03/08/2022 Chemotherapy   Patient is on Treatment Plan : BREAST METASTATIC fam-trastuzumab deruxtecan-nxki (Enhertu) q21d     02/15/2022 -  Chemotherapy   Patient is on Treatment Plan : BREAST METASTATIC Fam-Trastuzumab Deruxtecan-nxki (Enhertu) (5.4) q21d        INTERVAL HISTORY:  Cache Decoursey Cheek is here for a follow up of metastatic breast cancer. She was last seen by me on 03/08/22. She presents to the clinic alone.  She tolerated cycle 2 Enhertu very well, with mild nausea for a day, no diarrhea, or other complaints.  Her energy level is maintained, she is able to function well at home.  She has not been walking for long distance much lately due to the hot weather.  She noticed her right inguinal lymph nodes has become much smaller now.  No significant dyspnea on exertion, orthopnea.  She does notice mild right ankle swelling and it goes down after eating her to infusion.   All other systems were reviewed with the patient and are negative.  MEDICAL HISTORY:  Past Medical History:  Diagnosis Date   Abnormal x-ray of lungs with single pulmonary nodule 03/15/2016   per records from [place]    Anemia    Asthma    Cholelithiasis 05/19/2017   On CT   Coronary artery  calcification seen on CAT scan 05/19/2017   Diabetes mellitus without complication ([place])    type 2   Diabetes mellitus, new onset ([place]) 01/20/2019   Dilated idiopathic cardiomyopathy ([place]) 12/2015   EF 15-20%. Diagnosed in [facility] [address]   Headache    NONE RECENT   Hypertension    Lymph nodes enlarged 07/25/2020   Malignant neoplasm of lower-outer quadrant of left breast of female, estrogen receptor negative ([place]) 06/11/2017   left breast   Mixed hyperlipidemia 06/17/2018   partially blind    Personal history of chemotherapy 2018-2019   Personal history of radiation therapy 2019   Retinitis pigmentosa    Retroperitoneal lymphadenopathy 07/25/2020   Uveitis     SURGICAL HISTORY: Past Surgical History:  Procedure Laterality Date   brain cyst removed  2007   to help with headaches per patient , aspirated    BREAST BIOPSY Left 06/06/2017   x2   BREAST CYST ASPIRATION Left 06/06/2017   BREAST LUMPECTOMY Left 06/26/2017   BREAST LUMPECTOMY WITH RADIOACTIVE SEED  AND SENTINEL LYMPH NODE BIOPSY Left 06/26/2017   Procedure: BREAST LUMPECTOMY WITH RADIOACTIVE SEED AND SENTINEL LYMPH NODE BIOPSY ERAS  PATHWAY;  Surgeon: [name], MD;  Location: [place];  Service: General;  Laterality: Left;  pec block   FINE NEEDLE ASPIRATION Left 02/23/2019   Procedure: FINE NEEDLE ASPIRATION;  Surgeon: [name], MD;  Location: [place];  Service: General;  Laterality: Left;  Asiration of left breast seroma    IR IMAGING GUIDED PORT INSERTION  10/13/2020   NASAL TURBINATE REDUCTION     PORT-A-CATH REMOVAL Right 02/23/2019   Procedure: REMOVAL PORT-A-CATH;  Surgeon: [name], MD;  Location: [place];  Service: General;  Laterality: Right;   PORTACATH PLACEMENT N/A 06/26/2017   Procedure: INSERTION PORT-A-CATH;  Surgeon: [name], MD;  Location: [place];  Service: General;  Laterality: N/A;   TRANSTHORACIC ECHOCARDIOGRAM  12/2015   A) [facility] May 2017: Mild concentric LVH. Global hypokinesis. GR daily. EF 18% severe LA dilation. Mitral annular dilatation with papillary muscle dysfunction and moderate MR. Dilated IVC consistent with elevated RAP.   TRANSTHORACIC ECHOCARDIOGRAM  05/2017; 08/2017   a) Mild concentric LVH.  EF 45 to 50% with diffuse hypokinesis.  GR 1 DD.  Mild aortic root dilation.;; b)  Mild LVH EF 45 to 50%.  Diffuse HK.  No significant valve disease.  No significant change.    I have reviewed the social history and family history with the patient and they are unchanged from previous note.  ALLERGIES:  is allergic to morphine and related, latex, tylenol with codeine #3 [acetaminophen-codeine], and penicillins.  MEDICATIONS:  Current Outpatient Medications  Medication Sig Dispense Refill   Accu-Chek FastClix Lancets MISC TEST TWICE A DAY. PT USES AN ACCU-CHEK GUIDE ME METER 102 each 2   amLODipine (NORVASC) 5 MG tablet Take 1 tablet (5 mg total) by mouth daily. 30 tablet 3   aspirin 81 MG chewable tablet Chew 81 mg by mouth daily.     atorvastatin (LIPITOR) 20 MG tablet Take 1 tablet (20 mg total) by mouth daily. 90 tablet 3   brimonidine (ALPHAGAN) 0.2 % ophthalmic solution Place 1 drop into both eyes 2 (two) times daily.     carvedilol (COREG) 12.5 MG tablet take 1 Tablet by mouth twice daily WITH meals 180 tablet 0   cholecalciferol (VITAMIN D3) 25 MCG (1000 UNIT) tablet Take 2,000 Units by mouth daily.     Continuous Blood Gluc Receiver (DEXCOM G7 RECEIVER) DEVI 1 Device by Does not apply route 2 (two) times daily. 1 each 1   Continuous Blood Gluc Sensor (DEXCOM G7 SENSOR) MISC Use as directed, replace every 10 DAYS 3 each 10   furosemide (LASIX) 20 MG tablet TAKE 2 TABLETS (40 MG TOTAL) BY MOUTH AS NEEDED. FOR WEIGHT GAIN 3 TO 4 LBS. 90 tablet 3   HUMALOG KWIKPEN 100 UNIT/ML KwikPen Inject 3-10 Units into the skin 3 (three) times daily before meals. Per sliding scale     ibuprofen (ADVIL) 800 MG tablet  Take 1 tablet (800 mg total) by mouth every 8 (eight) hours as needed. 30 tablet 2   insulin glargine (LANTUS) 100 UNIT/ML injection Inject 25 Units into the skin at bedtime. 15 units in am  on chemo week     Lancets Misc. (ACCU-CHEK SOFTCLIX LANCET DEV) KIT 1 Units by Does not apply route 2 (two) times daily. Check FSBS BID - Include strips # 50 with 5 refills, Lancets #50 with  5 refills DX: Type 2 DM - ICD 10: E11.9 1 kit 0   lidocaine-prilocaine (EMLA) cream Apply 1 application topically as needed. 30 g 0   linaclotide (LINZESS) 72 MCG capsule Take 72 mcg by mouth as needed.     metFORMIN (GLUCOPHAGE) 1000 MG tablet Take 1 tablet (1,000 mg total) by mouth 2 (two) times daily with a meal. 180 tablet 3   Multiple Vitamins-Minerals (WOMENS MULTI PO) Take 15 mLs by mouth daily.     ondansetron (ZOFRAN) 8 MG tablet Take 1 tablet (8 mg total) by mouth every 8 (eight) hours as needed for nausea or vomiting (begin on day 3 after chemo). 30 tablet 1   oxyCODONE (OXY IR/ROXICODONE) 5 MG immediate release tablet Take 1 tablet (5 mg total) by mouth every 8 (eight) hours as needed for severe pain. 60 tablet 0   oxyCODONE-acetaminophen (PERCOCET/ROXICET) 5-325 MG tablet Take 1 tablet by mouth every 6 (six) hours as needed for severe pain.     pantoprazole (PROTONIX) 40 MG tablet TAKE 1 TABLET BY MOUTH EVERY DAY 90 tablet 1   prochlorperazine (COMPAZINE) 10 MG tablet Take 1 tablet (10 mg total) by mouth every 6 (six) hours as needed (Nausea or vomiting). 30 tablet 1   promethazine (PHENERGAN) 25 MG tablet Take 1 tablet (25 mg total) by mouth every 6 (six) hours as needed for nausea or vomiting. 30 tablet 0   sacubitril-valsartan (ENTRESTO) 97-103 MG Take 1 tablet by mouth 2 (two) times daily. 180 tablet 3   Semaglutide (RYBELSUS) 7 MG TABS Take 7 mg by mouth daily. 90 tablet 3   spironolactone (ALDACTONE) 25 MG tablet Take 25 mg by mouth daily.     vitamin B-12 (CYANOCOBALAMIN) 1000 MCG tablet Take 1,000 mcg by  mouth daily.     vitamin C (ASCORBIC ACID) 500 MG tablet Take 500 mg by mouth in the morning, at noon, in the evening, and at bedtime.     zinc gluconate 50 MG tablet Take 50 mg by mouth daily.     Current Facility-Administered Medications  Medication Dose Route Frequency Provider Last Rate Last Admin   insulin glulisine (APIDRA) injection 6 Units  6 Units Subcutaneous Once Francis Gaines B, PA-C        PHYSICAL EXAMINATION: ECOG PERFORMANCE STATUS: 1 - Symptomatic but completely ambulatory  Vitals:   03/29/22 0818  BP: (!) 146/93  Pulse: 84  Resp: 18  Temp: (!) 97.2 F (36.2 C)  SpO2: 100%   Wt Readings from Last 3 Encounters:  03/29/22 218 lb 6 oz (99.1 kg)  03/08/22 225 lb 3 oz (102.1 kg)  02/21/22 213 lb 1.6 oz (96.7 kg)     GENERAL:alert, no distress and comfortable SKIN: skin color, texture, turgor are normal, no rashes or significant lesions EYES: normal, Conjunctiva are pink and non-injected, sclera clear NECK: supple, thyroid normal size, non-tender, without nodularity LYMPH:  no palpable lymphadenopathy in the cervical, axillary, previous b/l inguinal and right upper thigh lymph nodes are not palpable now. LUNGS: clear to auscultation and percussion with normal breathing effort HEART: regular rate & rhythm and no murmurs and no lower extremity edema ABDOMEN:abdomen soft, non-tender and normal bowel sounds Musculoskeletal:no cyanosis of digits and no clubbing  NEURO: alert & oriented x 3 with fluent speech, no focal motor/sensory deficits  LABORATORY DATA:  I have reviewed the data as listed    Latest Ref Rng & Units 03/08/2022   10:24 AM 02/21/2022    8:19 AM 02/08/2022  8:12 AM  CBC  WBC 4.0 - 10.5 K/uL 4.1  4.5  4.5   Hemoglobin 12.0 - 15.0 g/dL 9.8  9.8  10.1   Hematocrit 36.0 - 46.0 % 30.7  29.8  31.2   Platelets 150 - 400 K/uL 362  199  262         Latest Ref Rng & Units 03/08/2022   10:24 AM 02/21/2022    8:19 AM 02/08/2022    8:12 AM  CMP   Glucose 70 - 99 mg/dL 175  166  114   BUN 8 - 23 mg/dL '11  13  13   ' Creatinine 0.44 - 1.00 mg/dL 0.75  0.72  0.70   Sodium 135 - 145 mmol/L 142  135  141   Potassium 3.5 - 5.1 mmol/L 3.9  3.8  4.2   Chloride 98 - 111 mmol/L 110  106  110   CO2 22 - 32 mmol/L '27  24  26   ' Calcium 8.9 - 10.3 mg/dL 8.7  9.2  9.5   Total Protein 6.5 - 8.1 g/dL 7.4  8.1  7.9   Total Bilirubin 0.3 - 1.2 mg/dL 0.4  0.5  0.4   Alkaline Phos 38 - 126 U/L 94  106  82   AST 15 - 41 U/L '28  29  12   ' ALT 0 - 44 U/L '25  23  7       ' RADIOGRAPHIC STUDIES: I have personally reviewed the radiological images as listed and agreed with the findings in the report. No results found.    Orders Placed This Encounter  Procedures   CBC with Differential ([place] Only)    Standing Status:   Future    Standing Expiration Date:   03/30/2023   CMP ([place] only)    Standing Status:   Future    Standing Expiration Date:   03/30/2023   CBC with Differential ([place] Only)    Standing Status:   Future    Standing Expiration Date:   04/20/2023   CMP ([place] only)    Standing Status:   Future    Standing Expiration Date:   04/20/2023   CBC with Differential ([place] Only)    Standing Status:   Future    Standing Expiration Date:   05/11/2023   CMP ([place] only)    Standing Status:   Future    Standing Expiration Date:   05/11/2023   All questions were answered. The patient knows to call the clinic with any problems, questions or concerns. No barriers to learning was detected. The total time spent in the appointment was 30 minutes.     [name], MD 03/29/2022   I, Wilburn Mylar, am acting as scribe for [name], MD.   I have reviewed the above documentation for accuracy and completeness, and I agree with the above.

## 2022-03-29 NOTE — Patient Instructions (Signed)
[facility] MEDICAL ONCOLOGY  Discharge Instructions: Thank you for choosing [facility] to provide your oncology and hematology care.   If you have a lab appointment with the Cancer Center, please go directly to the Cancer Center and check in at the registration area.   Wear comfortable clothing and clothing appropriate for easy access to any Portacath or PICC line.   We strive to give you quality time with your provider. You may need to reschedule your appointment if you arrive late (15 or more minutes).  Arriving late affects you and other patients whose appointments are after yours.  Also, if you miss three or more appointments without notifying the office, you may be dismissed from the clinic at the provider's discretion.      For prescription refill requests, have your pharmacy contact our office and allow 72 hours for refills to be completed.    Today you received the following chemotherapy and/or immunotherapy agents: Enhertu      To help prevent nausea and vomiting after your treatment, we encourage you to take your nausea medication as directed.  BELOW ARE SYMPTOMS THAT SHOULD BE REPORTED IMMEDIATELY: *FEVER GREATER THAN 100.4 F (38 C) OR HIGHER *CHILLS OR SWEATING *NAUSEA AND VOMITING THAT IS NOT CONTROLLED WITH YOUR NAUSEA MEDICATION *UNUSUAL SHORTNESS OF BREATH *UNUSUAL BRUISING OR BLEEDING *URINARY PROBLEMS (pain or burning when urinating, or frequent urination) *BOWEL PROBLEMS (unusual diarrhea, constipation, pain near the anus) TENDERNESS IN MOUTH AND THROAT WITH OR WITHOUT PRESENCE OF ULCERS (sore throat, sores in mouth, or a toothache) UNUSUAL RASH, SWELLING OR PAIN  UNUSUAL VAGINAL DISCHARGE OR ITCHING   Items with * indicate a potential emergency and should be followed up as soon as possible or go to the Emergency Department if any problems should occur.  Please show the CHEMOTHERAPY ALERT CARD or IMMUNOTHERAPY ALERT CARD at check-in to  the Emergency Department and triage nurse.  Should you have questions after your visit or need to cancel or reschedule your appointment, please contact [facility]  Dept: [phone]  and follow the prompts.  Office hours are 8:00 a.m. to 4:30 p.m. Monday - Friday. Please note that voicemails left after 4:00 p.m. may not be returned until the following business day.  We are closed weekends and major holidays. You have access to a nurse at all times for urgent questions. Please call the main number to the clinic Dept: [phone] and follow the prompts.   For any non-urgent questions, you may also contact your provider using MyChart. We now offer e-Visits for anyone 18 and older to request care online for non-urgent symptoms. For details visit [email or web address].   Also download the MyChart app! Go to the app store, search "MyChart", open the app, select [facility], and log in with your MyChart username and password.  Masks are optional in the cancer centers. If you would like for your care team to wear a mask while they are taking care of you, please let them know. You may have one support person who is at least 64 years old accompany you for your appointments. 

## 2022-03-31 ENCOUNTER — Emergency Department (HOSPITAL_COMMUNITY)
Admission: EM | Admit: 2022-03-31 | Discharge: 2022-03-31 | Disposition: A | Discharge status: Home / Self Care | Payer: Medicare Other | Attending: Emergency Medicine | Admitting: Emergency Medicine

## 2022-03-31 ENCOUNTER — Other Ambulatory Visit: Payer: Self-pay

## 2022-03-31 ENCOUNTER — Encounter (HOSPITAL_COMMUNITY): Payer: Self-pay | Admitting: Emergency Medicine

## 2022-03-31 DIAGNOSIS — R404 Transient alteration of awareness: Secondary | ICD-10-CM | POA: Diagnosis not present

## 2022-03-31 DIAGNOSIS — E119 Type 2 diabetes mellitus without complications: Secondary | ICD-10-CM | POA: Diagnosis not present

## 2022-03-31 DIAGNOSIS — Z91040 Latex allergy status: Secondary | ICD-10-CM | POA: Diagnosis not present

## 2022-03-31 DIAGNOSIS — Z79899 Other long term (current) drug therapy: Secondary | ICD-10-CM | POA: Diagnosis not present

## 2022-03-31 DIAGNOSIS — T675XXA Heat exhaustion, unspecified, initial encounter: Secondary | ICD-10-CM | POA: Insufficient documentation

## 2022-03-31 DIAGNOSIS — Z794 Long term (current) use of insulin: Secondary | ICD-10-CM | POA: Insufficient documentation

## 2022-03-31 DIAGNOSIS — Z7984 Long term (current) use of oral hypoglycemic drugs: Secondary | ICD-10-CM | POA: Insufficient documentation

## 2022-03-31 DIAGNOSIS — Z853 Personal history of malignant neoplasm of breast: Secondary | ICD-10-CM | POA: Insufficient documentation

## 2022-03-31 DIAGNOSIS — R42 Dizziness and giddiness: Secondary | ICD-10-CM | POA: Diagnosis not present

## 2022-03-31 DIAGNOSIS — E876 Hypokalemia: Secondary | ICD-10-CM | POA: Diagnosis not present

## 2022-03-31 DIAGNOSIS — R600 Localized edema: Secondary | ICD-10-CM | POA: Insufficient documentation

## 2022-03-31 DIAGNOSIS — R110 Nausea: Secondary | ICD-10-CM | POA: Insufficient documentation

## 2022-03-31 DIAGNOSIS — I10 Essential (primary) hypertension: Secondary | ICD-10-CM | POA: Insufficient documentation

## 2022-03-31 DIAGNOSIS — Z7982 Long term (current) use of aspirin: Secondary | ICD-10-CM | POA: Insufficient documentation

## 2022-03-31 LAB — BASIC METABOLIC PANEL
Anion gap: 7 (ref 5–15)
BUN: 16 mg/dL (ref 8–23)
CO2: 23 mmol/L (ref 22–32)
Calcium: 9 mg/dL (ref 8.9–10.3)
Chloride: 111 mmol/L (ref 98–111)
Creatinine, Ser: 0.9 mg/dL (ref 0.44–1.00)
GFR, Estimated: >60 mL/min (ref >60)
Glucose, Bld: 178 mg/dL — ABNORMAL HIGH (ref 70–99)
Potassium: 3.3 mmol/L — ABNORMAL LOW (ref 3.5–5.1)
Sodium: 141 mmol/L (ref 135–145)

## 2022-03-31 LAB — URINALYSIS, ROUTINE W REFLEX MICROSCOPIC
Bilirubin Urine: NEGATIVE
Glucose, UA: NEGATIVE mg/dL
Hgb urine dipstick: NEGATIVE
Ketones, ur: NEGATIVE mg/dL
Leukocytes,Ua: NEGATIVE
Nitrite: NEGATIVE
Protein, ur: NEGATIVE mg/dL
Specific Gravity, Urine: 1.018 (ref 1.005–1.030)
pH: 5 (ref 5.0–8.0)

## 2022-03-31 LAB — CBC
HCT: 31 % — ABNORMAL LOW (ref 36.0–46.0)
Hemoglobin: 9.7 g/dL — ABNORMAL LOW (ref 12.0–15.0)
MCH: 29 pg (ref 26.0–34.0)
MCHC: 31.3 g/dL (ref 30.0–36.0)
MCV: 92.5 fL (ref 80.0–100.0)
Platelets: 351 10*3/uL (ref 150–400)
RBC: 3.35 MIL/uL — ABNORMAL LOW (ref 3.87–5.11)
RDW: 15.9 % — ABNORMAL HIGH (ref 11.5–15.5)
WBC: 5.5 10*3/uL (ref 4.0–10.5)
nRBC: 0 % (ref 0.0–0.2)

## 2022-03-31 LAB — CBG MONITORING, ED: Glucose-Capillary: 189 mg/dL — ABNORMAL HIGH (ref 70–99)

## 2022-03-31 MED ORDER — SODIUM CHLORIDE 0.9 % IV BOLUS
1000.0000 mL | Freq: Once | INTRAVENOUS | Status: AC
  Administered 2022-03-31: 1000 mL via INTRAVENOUS

## 2022-03-31 MED ORDER — POTASSIUM CHLORIDE CRYS ER 20 MEQ PO TBCR
40.0000 meq | EXTENDED_RELEASE_TABLET | Freq: Once | ORAL | Status: AC
  Administered 2022-03-31: 40 meq via ORAL
  Filled 2022-03-31: qty 2

## 2022-03-31 NOTE — ED Provider Triage Note (Signed)
Emergency Medicine Provider Triage Evaluation Note  Kristin Ward , a 66 y.o. female  was evaluated in triage.  Pt complains of near syncope. Pt was at the food truck today but felt lightheadedness and having to guide herself down on to the ground. Denies syncope, does not endorse any cp, sob, headache, neck pain, fever, cold sxs.  Report lightheadedness/dizzy.  Is a breast CA pt and currently receiving treatment  Review of Systems  Positive: As above Negative: As above  Physical Exam  BP 135/87 (BP Location: Left Arm)   Pulse 90   Temp 98.7 F (37.1 C) (Oral)   Resp 16   SpO2 96%  Gen:   Awake, no distress   Resp:  Normal effort  MSK:   Moves extremities without difficulty  Other:    Medical Decision Making  Medically screening exam initiated at 5:53 PM.  Appropriate orders placed.  Kristin Ward was informed that the remainder of the evaluation will be completed by another provider, this initial triage assessment does not replace that evaluation, and the importance of remaining in the ED until their evaluation is complete.     Domenic Moras, PA-C 03/31/22 [phone]

## 2022-03-31 NOTE — ED Notes (Signed)
Pt ambulated to the restroom. Patient did not complain about dizziness or lightheadedness.

## 2022-03-31 NOTE — ED Triage Notes (Signed)
BIBA Per EMS: Pt coming from food truck festival w/ c/o syncopal episode today. Pt reports feeling dizziness & syncopal episode today. Denies LOC, hitting head. Pt feels better now. 262m given en route.  90 HR  98 CBG  98% RA 16RR 130/70 22 R hand

## 2022-03-31 NOTE — ED Notes (Signed)
Pt A&Ox4 ambulatory at d/c with independent steady gait. Pt verbalized understanding of d/c instructions and follow up care. 

## 2022-03-31 NOTE — ED Notes (Signed)
Pt had difficulty walking to bathroom had to use the steady on the way back

## 2022-03-31 NOTE — Discharge Instructions (Signed)
You have heat exhaustion.  Please stay hydrated.  See your doctor for follow-up  Return to ER if you pass out, vomiting, abdominal pain

## 2022-03-31 NOTE — ED Provider Notes (Signed)
[facility] DEPT Provider Note   CSN: [id number] Arrival date & time: 03/31/22  1733     History  Chief Complaint  Patient presents with   Loss of Consciousness    Kristin Ward is a 66 y.o. female history of hypertension, diabetes, breast cancer on chemo (last chemo was 2 days ago) and finished radiation here presenting with dizziness.  Patient was out with the food truck festival earlier today.  She states that it was very hot outside and she went to get some [place] ice and felt very lightheaded and dizzy.  She then felt very nauseated.  She states that she felt dehydrated.  She sat down and did not actually pass out.  Denies any chest pain.  Denies any shortness of breath  The history is provided by the patient.       Home Medications Prior to Admission medications   Medication Sig Start Date End Date Taking? Authorizing Provider  Accu-Chek FastClix Lancets MISC TEST TWICE A DAY. PT USES AN ACCU-CHEK GUIDE ME METER 01/29/21   Henson, Vickie L, NP-C  amLODipine (NORVASC) 5 MG tablet Take 1 tablet (5 mg total) by mouth daily. 02/06/22   [name], [name], MD  aspirin 81 MG chewable tablet Chew 81 mg by mouth daily.    [provider]  atorvastatin (LIPITOR) 20 MG tablet Take 1 tablet (20 mg total) by mouth daily. 06/25/21 06/25/22  [name], MD  brimonidine (ALPHAGAN) 0.2 % ophthalmic solution Place 1 drop into both eyes 2 (two) times daily. 09/19/21   [provider]  carvedilol (COREG) 12.5 MG tablet take 1 Tablet by mouth twice daily WITH meals 03/27/22   [name], MD  cholecalciferol (VITAMIN D3) 25 MCG (1000 UNIT) tablet Take 2,000 Units by mouth daily.    [provider]  Continuous Blood Gluc Receiver (DEXCOM G7 RECEIVER) DEVI 1 Device by Does not apply route 2 (two) times daily. 11/15/21   Francis Gaines B, PA-C  Continuous Blood Gluc Sensor (DEXCOM G7 SENSOR) MISC Use as directed, replace every 10  DAYS 01/16/22   Francis Gaines B, PA-C  furosemide (LASIX) 20 MG tablet TAKE 2 TABLETS (40 MG TOTAL) BY MOUTH AS NEEDED. FOR WEIGHT GAIN 3 TO 4 LBS. 09/21/21   [name], MD  HUMALOG KWIKPEN 100 UNIT/ML KwikPen Inject 3-10 Units into the skin 3 (three) times daily before meals. Per sliding scale 11/23/21   [provider]  ibuprofen (ADVIL) 800 MG tablet Take 1 tablet (800 mg total) by mouth every 8 (eight) hours as needed. 09/07/21   [name], MD  insulin glargine (LANTUS) 100 UNIT/ML injection Inject 25 Units into the skin at bedtime. 15 units in am  on chemo week    [provider]  Lancets Misc. (ACCU-CHEK SOFTCLIX LANCET DEV) KIT 1 Units by Does not apply route 2 (two) times daily. Check FSBS BID - Include strips # 50 with 5 refills, Lancets #50 with 5 refills DX: Type 2 DM - ICD 10: E11.9 10/12/21   Francis Gaines B, PA-C  lidocaine-prilocaine (EMLA) cream Apply 1 application topically as needed. 10/23/20   [name], MD  linaclotide [facility]) 72 MCG capsule Take 72 mcg by mouth as needed.    [provider]  metFORMIN (GLUCOPHAGE) 1000 MG tablet Take 1 tablet (1,000 mg total) by mouth 2 (two) times daily with a meal. 10/12/21   Irene Pap, PA-C  Multiple Vitamins-Minerals (WOMENS MULTI PO) Take  15 mLs by mouth daily.    [provider]  ondansetron (ZOFRAN) 8 MG tablet Take 1 tablet (8 mg total) by mouth every 8 (eight) hours as needed for nausea or vomiting (begin on day 3 after chemo). 12/21/21   [name], MD  oxyCODONE (OXY IR/ROXICODONE) 5 MG immediate release tablet Take 1 tablet (5 mg total) by mouth every 8 (eight) hours as needed for severe pain. 12/21/21   [name], MD  oxyCODONE-acetaminophen (PERCOCET/ROXICET) 5-325 MG tablet Take 1 tablet by mouth every 6 (six) hours as needed for severe pain.    [provider]  pantoprazole (PROTONIX) 40 MG tablet TAKE 1 TABLET BY MOUTH EVERY DAY 12/06/21   [name], MD  prochlorperazine  (COMPAZINE) 10 MG tablet Take 1 tablet (10 mg total) by mouth every 6 (six) hours as needed (Nausea or vomiting). 02/15/22   [name], MD  promethazine (PHENERGAN) 25 MG tablet Take 1 tablet (25 mg total) by mouth every 6 (six) hours as needed for nausea or vomiting. 03/08/22   [name], MD  sacubitril-valsartan (ENTRESTO) 97-103 MG Take 1 tablet by mouth 2 (two) times daily. 05/02/21   [name], MD  Semaglutide (RYBELSUS) 7 MG TABS Take 7 mg by mouth daily. 10/12/21   Irene Pap, PA-C  spironolactone (ALDACTONE) 25 MG tablet Take 25 mg by mouth daily.    [provider]  vitamin B-12 (CYANOCOBALAMIN) 1000 MCG tablet Take 1,000 mcg by mouth daily.    [provider]  vitamin C (ASCORBIC ACID) 500 MG tablet Take 500 mg by mouth in the morning, at noon, in the evening, and at bedtime.    [provider]  zinc gluconate 50 MG tablet Take 50 mg by mouth daily.    [provider]      Allergies    Morphine and related, Latex, Tylenol with codeine #3 [acetaminophen-codeine], and Penicillins    Review of Systems   Review of Systems  Neurological:  Positive for dizziness.  All other systems reviewed and are negative.   Physical Exam Updated Vital Signs BP (!) 142/84   Pulse 78   Temp 98.7 F (37.1 C) (Oral)   Resp 15   Ht '5\' 7"'  (1.702 m)   Wt 98 kg   SpO2 99%   BMI 33.83 kg/m  Physical Exam Vitals and nursing note reviewed.  Constitutional:      Comments: Dehydrated, chronically ill  HENT:     Head: Normocephalic.     Nose: Nose normal.     Mouth/Throat:     Mouth: Mucous membranes are dry.  Eyes:     Extraocular Movements: Extraocular movements intact.     Pupils: Pupils are equal, round, and reactive to light.  Cardiovascular:     Rate and Rhythm: Normal rate and regular rhythm.     Pulses: Normal pulses.     Heart sounds: Normal heart sounds.  Pulmonary:     Effort: Pulmonary effort is normal.     Breath sounds: Normal breath  sounds.  Abdominal:     General: Abdomen is flat.     Palpations: Abdomen is soft.  Musculoskeletal:        General: Normal range of motion.     Cervical back: Normal range of motion and neck supple.     Comments: Trace edema on the right leg but no obvious calf tenderness  Skin:    General: Skin is warm.     Capillary Refill:  Capillary refill takes less than 2 seconds.  Neurological:     General: No focal deficit present.     Mental Status: She is oriented to person, place, and time.  Psychiatric:        Mood and Affect: Mood normal.        Behavior: Behavior normal.     ED Results / Procedures / Treatments   Labs (all labs ordered are listed, but only abnormal results are displayed) Labs Reviewed  BASIC METABOLIC PANEL - Abnormal; Notable for the following components:      Result Value   Potassium 3.3 (*)    Glucose, Bld 178 (*)    All other components within normal limits  CBC - Abnormal; Notable for the following components:   RBC 3.35 (*)    Hemoglobin 9.7 (*)    HCT 31.0 (*)    RDW 15.9 (*)    All other components within normal limits  URINALYSIS, ROUTINE W REFLEX MICROSCOPIC - Abnormal; Notable for the following components:   Color, Urine AMBER (*)    APPearance HAZY (*)    All other components within normal limits  CBG MONITORING, ED - Abnormal; Notable for the following components:   Glucose-Capillary 189 (*)    All other components within normal limits  CK    EKG EKG Interpretation  Date/Time:  Sunday March 31 2022 17:47:08 EDT Ventricular Rate:  91 PR Interval:  215 QRS Duration: 105 QT Interval:  359 QTC Calculation: 442 R Axis:   -10 Text Interpretation: Sinus rhythm Prolonged PR interval Left ventricular hypertrophy Nonspecific T abnrm, anterolateral leads No significant change since last tracing Confirmed by Wandra Arthurs [phone]) on 03/31/2022 6:44:12 PM  Radiology No results found.  Procedures Procedures    Medications Ordered in  ED Medications  sodium chloride 0.9 % bolus 1,000 mL (1,000 mLs Intravenous Bolus 03/31/22 1834)  potassium chloride SA (KLOR-CON M) CR tablet 40 mEq (40 mEq Oral Given 03/31/22 2026)    ED Course/ Medical Decision Making/ A&P                           Medical Decision Making Kristin Ward is a 66 y.o. female here presenting with near syncope.  Patient just had chemotherapy 2 days ago and is in the heat.  Consider electrolyte abnormality versus neutropenia versus heat exhaustion.  We will check CBC and BMP and urinalysis and hydrate patient and reassess.  9:35 PM Labs show potassium of 3.2.  UA unremarkable.  Patient was hydrated and felt better.  Tolerated p.o. in the ED.  Stable for discharge  Problems Addressed: Heat exhaustion, initial encounter: acute illness or injury Hypokalemia: acute illness or injury  Amount and/or Complexity of Data Reviewed Labs: ordered. Decision-making details documented in ED Course.  Risk Prescription drug management.    Final Clinical Impression(s) / ED Diagnoses Final diagnoses:  None    Rx / DC Orders ED Discharge Orders     None         Drenda Freeze, MD 03/31/22 2136

## 2022-04-01 ENCOUNTER — Encounter (HOSPITAL_COMMUNITY): Payer: Self-pay | Admitting: Internal Medicine

## 2022-04-01 ENCOUNTER — Ambulatory Visit (HOSPITAL_COMMUNITY)
Admission: RE | Admit: 2022-04-01 | Discharge: 2022-04-01 | Disposition: A | Discharge status: Home / Self Care | Payer: Medicare Other | Source: Ambulatory Visit | Attending: Internal Medicine | Admitting: Internal Medicine

## 2022-04-01 ENCOUNTER — Ambulatory Visit (HOSPITAL_BASED_OUTPATIENT_CLINIC_OR_DEPARTMENT_OTHER)
Admission: RE | Admit: 2022-04-01 | Discharge: 2022-04-01 | Disposition: A | Discharge status: Home / Self Care | Payer: Medicare Other | Source: Ambulatory Visit | Attending: Internal Medicine | Admitting: Internal Medicine

## 2022-04-01 VITALS — BP 140/88 | HR 101 | Wt 215.0 lb

## 2022-04-01 DIAGNOSIS — E1165 Type 2 diabetes mellitus with hyperglycemia: Secondary | ICD-10-CM | POA: Diagnosis not present

## 2022-04-01 DIAGNOSIS — Z803 Family history of malignant neoplasm of breast: Secondary | ICD-10-CM | POA: Diagnosis not present

## 2022-04-01 DIAGNOSIS — R55 Syncope and collapse: Secondary | ICD-10-CM | POA: Insufficient documentation

## 2022-04-01 DIAGNOSIS — C50512 Malignant neoplasm of lower-outer quadrant of left female breast: Secondary | ICD-10-CM

## 2022-04-01 DIAGNOSIS — Z171 Estrogen receptor negative status [ER-]: Secondary | ICD-10-CM

## 2022-04-01 DIAGNOSIS — I340 Nonrheumatic mitral (valve) insufficiency: Secondary | ICD-10-CM | POA: Diagnosis not present

## 2022-04-01 DIAGNOSIS — I110 Hypertensive heart disease with heart failure: Secondary | ICD-10-CM | POA: Insufficient documentation

## 2022-04-01 DIAGNOSIS — H547 Unspecified visual loss: Secondary | ICD-10-CM | POA: Insufficient documentation

## 2022-04-01 DIAGNOSIS — E119 Type 2 diabetes mellitus without complications: Secondary | ICD-10-CM | POA: Insufficient documentation

## 2022-04-01 DIAGNOSIS — I5042 Chronic combined systolic (congestive) and diastolic (congestive) heart failure: Secondary | ICD-10-CM

## 2022-04-01 DIAGNOSIS — H3552 Pigmentary retinal dystrophy: Secondary | ICD-10-CM | POA: Diagnosis not present

## 2022-04-01 DIAGNOSIS — Z794 Long term (current) use of insulin: Secondary | ICD-10-CM | POA: Insufficient documentation

## 2022-04-01 DIAGNOSIS — I428 Other cardiomyopathies: Secondary | ICD-10-CM | POA: Diagnosis not present

## 2022-04-01 DIAGNOSIS — Z79899 Other long term (current) drug therapy: Secondary | ICD-10-CM | POA: Insufficient documentation

## 2022-04-01 DIAGNOSIS — Z8249 Family history of ischemic heart disease and other diseases of the circulatory system: Secondary | ICD-10-CM | POA: Insufficient documentation

## 2022-04-01 DIAGNOSIS — Z87891 Personal history of nicotine dependence: Secondary | ICD-10-CM | POA: Insufficient documentation

## 2022-04-01 DIAGNOSIS — I10 Essential (primary) hypertension: Secondary | ICD-10-CM | POA: Diagnosis not present

## 2022-04-01 DIAGNOSIS — G629 Polyneuropathy, unspecified: Secondary | ICD-10-CM | POA: Diagnosis not present

## 2022-04-01 DIAGNOSIS — I429 Cardiomyopathy, unspecified: Secondary | ICD-10-CM | POA: Diagnosis not present

## 2022-04-01 DIAGNOSIS — E7800 Pure hypercholesterolemia, unspecified: Secondary | ICD-10-CM | POA: Diagnosis not present

## 2022-04-01 DIAGNOSIS — Z01818 Encounter for other preprocedural examination: Secondary | ICD-10-CM | POA: Diagnosis not present

## 2022-04-01 LAB — ECHOCARDIOGRAM COMPLETE
Area-P 1/2: 6.27 cm2
MV M vel: 4.72 m/s
MV Peak grad: 89.1 mmHg
P 1/2 time: 269 msec
S' Lateral: 3.3 cm

## 2022-04-01 MED ORDER — CARVEDILOL 12.5 MG PO TABS: 18.7500 mg | ORAL_TABLET | Freq: Two times a day (BID) | ORAL | 3 refills | Status: DC

## 2022-04-01 NOTE — Patient Instructions (Signed)
Good to see you today!  INCREASE Coreg 18.75 mg(1 1/2 tablets) Twice daily  Your physician has requested that you have an echocardiogram. Echocardiography is a painless test that uses sound waves to create images of your heart. It provides your doctor with information about the size and shape of your heart and how well your heart's chambers and valves are working. This procedure takes approximately one hour. There are no restrictions for this procedure.  Your physician has requested that you have a cardiac MRI. Cardiac MRI uses a computer to create images of your heart as its beating, producing both still and moving pictures of your heart and major blood vessels. For further information please visit [email or web address] Please follow the instruction sheet given to you today for more information.  Your physician recommends that you schedule a follow-up appointment in: 3 months with echocardiogram  If you have any questions or concerns before your next appointment please send [place] a message through [place] or call our office at [phone].    TO LEAVE A MESSAGE FOR THE NURSE SELECT OPTION 2, PLEASE LEAVE A MESSAGE INCLUDING: YOUR NAME DATE OF BIRTH CALL BACK NUMBER REASON FOR CALL**this is important as we prioritize the call backs  YOU WILL RECEIVE A CALL BACK THE SAME DAY AS LONG AS YOU CALL BEFORE 4:00 PM  At the [facility], you and your health needs are our priority. As part of our continuing mission to provide you with exceptional heart care, we have created designated Provider Care Teams. These Care Teams include your primary Cardiologist (physician) and Advanced Practice Providers (APPs- Physician Assistants and Nurse Practitioners) who all work together to provide you with the care you need, when you need it.   You may see any of the following providers on your designated Care Team at your next follow up: Dr Glori Bickers Dr Haynes Kerns, NP Lyda Jester, [place] [facility] [place], [place] Audry Riles, PharmD   Please be sure to bring in all your medications bottles to every appointment.

## 2022-04-01 NOTE — Progress Notes (Signed)
Cardio-Oncology Clinic Consult Note   Referring Physician: Dr. Burr Medico  Primary Cardiologist: Dr, Ellyn Hack  HPI:  Kristin Ward is a 66 y.o. 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 Burr Medico for f/u in the Cardio-oncology  Found to have cardiomyopathy in 2017 in [place] (prior to chemo). EF 18% did not have a cath.   In 2018 treated for breast cancer. Had left lumpectomy, adjuvant chemo with docetaxel and cytoxan +XRT.   Prior records limited. Diagnosed with HF in 2016 while in [place]. Later relocated to the area and was followed by [facility] for a while. Subsequently followed by Dr. Ellyn Hack, last visit in 2020. Ef eventually recovered to 45-50%.   In 1/22 found to have recurrent metastatic cancer. Treated with 4 cycles of Taxol -stopped due to Neuropathy. Switched to Xeloda. Switched to [place] d/t progression. Held 02/23 d/t uncontrolled hyperglycemia.CT CAP stable right iliac wing lesion, increased inguinal lymph nodes. Biopsy right inguinal lymph node consistent with metastatic breast carcinoma. Now getting palliative XRT. Now planning to switch to Enhertu.   Echo 01/31/22 (not on Enhertu) with EF 35-40%, GLS -12.9%, RV mildly reduced  Remains on Enhertu every 3 weeks. Tolerating ok. No CP, SOB or edema. Had syncopal episode last week after treatment. Felt to be dehydrated. Came to ER and got fluids. Felt much better. No recurrence   Echo today 04/01/22 EF ~ 40% (read as 45-50%) Personally reviewed    Echo 12/2015 in [place]: EF 18%, RV reduced, moderate MR Echo 05/2017: EF 45-50% Echo 08/2017: EF 45-50%, mild LVH, RV okay   Cancer Profile  Left breast invasive ductal carcinoma, triple negative    2018 Initial Diagnosis      S/p left breast lumpectomy, adjuvant chemo (docetaxel and cytoxan) and radiation    09/02/20 New right gluteal pain. Biopsy  showed metastatic carcinoma, breast primary ER 20% positive, PR and HER2 negative    02/22-05/22 4 cycles Taxol - stopped d/t peripheral neuropathy   05/22-11/22 Took Xeloda   11/22-02/23 Switched to [place] d/t progression. Held 02/23 d/t uncontrolled hyperglycemia.   04/23 CT CAP stable right iliac wing lesion, increased inguinal lymph nodes   06/23 Biopsy right inguinal lymph node consistent with metastatic breast carcinoma       Past Medical History:  Diagnosis Date   Abnormal x-ray of lungs with single pulmonary nodule 03/15/2016   per records from [place]    Anemia    Asthma    Cholelithiasis 05/19/2017   On CT   Coronary artery calcification seen on CAT scan 05/19/2017   Diabetes mellitus without complication ([place])    type 2   Diabetes mellitus, new onset ([place]) 01/20/2019   Dilated idiopathic cardiomyopathy ([place]) 12/2015   EF 15-20%. Diagnosed in [facility]   Headache    NONE RECENT   Hypertension    Lymph nodes enlarged 07/25/2020   Malignant neoplasm of lower-outer quadrant of left breast of female, estrogen receptor negative ([place]) 06/11/2017   left breast   Mixed hyperlipidemia 06/17/2018   partially blind    Personal history of chemotherapy 2018-2019   Personal history of radiation therapy 2019   Retinitis pigmentosa    Retroperitoneal lymphadenopathy 07/25/2020   Uveitis     Current Outpatient Medications  Medication Sig Dispense Refill   Accu-Chek FastClix Lancets MISC TEST TWICE A DAY. PT USES AN ACCU-CHEK GUIDE ME METER 102 each 2  amLODipine (NORVASC) 5 MG tablet Take 1 tablet (5 mg total) by mouth daily. 30 tablet 3   aspirin 81 MG chewable tablet Chew 81 mg by mouth daily.     atorvastatin (LIPITOR) 20 MG tablet Take 1 tablet (20 mg total) by mouth daily. 90 tablet 3   brimonidine (ALPHAGAN) 0.2 % ophthalmic solution Place 1 drop into both eyes 2 (two) times daily.     carvedilol (COREG) 12.5 MG tablet take 1 Tablet by mouth twice  daily WITH meals 180 tablet 0   cholecalciferol (VITAMIN D3) 25 MCG (1000 UNIT) tablet Take 2,000 Units by mouth daily.     Continuous Blood Gluc Receiver (DEXCOM G7 RECEIVER) DEVI 1 Device by Does not apply route 2 (two) times daily. 1 each 1   Continuous Blood Gluc Sensor (DEXCOM G7 SENSOR) MISC Use as directed, replace every 10 DAYS 3 each 10   furosemide (LASIX) 20 MG tablet TAKE 2 TABLETS (40 MG TOTAL) BY MOUTH AS NEEDED. FOR WEIGHT GAIN 3 TO 4 LBS. 90 tablet 3   HUMALOG KWIKPEN 100 UNIT/ML KwikPen Inject 3-10 Units into the skin 3 (three) times daily before meals. Per sliding scale     ibuprofen (ADVIL) 800 MG tablet Take 1 tablet (800 mg total) by mouth every 8 (eight) hours as needed. 30 tablet 2   Lancets Misc. (ACCU-CHEK SOFTCLIX LANCET DEV) KIT 1 Units by Does not apply route 2 (two) times daily. Check FSBS BID - Include strips # 50 with 5 refills, Lancets #50 with 5 refills DX: Type 2 DM - ICD 10: E11.9 1 kit 0   lidocaine-prilocaine (EMLA) cream Apply 1 application topically as needed. 30 g 0   linaclotide (LINZESS) 72 MCG capsule Take 72 mcg by mouth as needed.     Multiple Vitamins-Minerals (WOMENS MULTI PO) Take 15 mLs by mouth daily.     ondansetron (ZOFRAN) 8 MG tablet Take 1 tablet (8 mg total) by mouth every 8 (eight) hours as needed for nausea or vomiting (begin on day 3 after chemo). 30 tablet 1   oxyCODONE (OXY IR/ROXICODONE) 5 MG immediate release tablet Take 1 tablet (5 mg total) by mouth every 8 (eight) hours as needed for severe pain. 60 tablet 0   oxyCODONE-acetaminophen (PERCOCET/ROXICET) 5-325 MG tablet Take 1 tablet by mouth every 6 (six) hours as needed for severe pain.     pantoprazole (PROTONIX) 40 MG tablet TAKE 1 TABLET BY MOUTH EVERY DAY 90 tablet 1   prochlorperazine (COMPAZINE) 10 MG tablet Take 1 tablet (10 mg total) by mouth every 6 (six) hours as needed (Nausea or vomiting). 30 tablet 1   promethazine (PHENERGAN) 25 MG tablet Take 1 tablet (25 mg total) by  mouth every 6 (six) hours as needed for nausea or vomiting. 30 tablet 0   sacubitril-valsartan (ENTRESTO) 97-103 MG Take 1 tablet by mouth 2 (two) times daily. 180 tablet 3   spironolactone (ALDACTONE) 25 MG tablet Take 25 mg by mouth daily.     vitamin B-12 (CYANOCOBALAMIN) 1000 MCG tablet Take 1,000 mcg by mouth daily.     vitamin C (ASCORBIC ACID) 500 MG tablet Take 500 mg by mouth in the morning, at noon, in the evening, and at bedtime.     zinc gluconate 50 MG tablet Take 50 mg by mouth daily.     Current Facility-Administered Medications  Medication Dose Route Frequency Provider Last Rate Last Admin   insulin glulisine (APIDRA) injection 6 Units  6 Units Subcutaneous Once [place],  Mare Ferrari, PA-C        Allergies  Allergen Reactions   Morphine And Related Nausea And Vomiting   Latex Hives and Itching    Burning    Tylenol With Codeine #3 [Acetaminophen-Codeine] Nausea And Vomiting   Penicillins Nausea Only, Swelling and Rash    Has patient had a PCN reaction causing immediate rash, facial/tongue/throat swelling, SOB or lightheadedness with hypotension: Yes Has patient had a PCN reaction causing severe rash involving mucus membranes or skin necrosis: Yes Has patient had a PCN reaction that required hospitalization: No Has patient had a PCN reaction occurring within the last 10 years: No TONGUE SWELLING AND RASH AROUND MOUTH If all of the above answers are "NO", then may proceed with Cephalosporin use.       Social History   Socioeconomic History   Marital status: Widowed    Spouse name: Not on file   Number of children: 2   Years of education: Not on file   Highest education level: Not on file  Occupational History   Not on file  Tobacco Use   Smoking status: Former    Packs/day: 0.25    Years: 23.00    Total pack years: 5.75    Types: Cigarettes    Quit date: 10/09/2015    Years since quitting: 6.4   Smokeless tobacco: Never  Vaping Use   Vaping Use: Never used   Substance and Sexual Activity   Alcohol use: No   Drug use: No   Sexual activity: Never  Other Topics Concern   Not on file  Social History Narrative   She is a widowed mother of 2 (daughter and son).   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   She moved to [place] apparently back in 2016, but was visiting family in [place] and was diagnosed with Cardiomyopathy-EF 18%.   She is usually accompanied by her daughter.    At baseline, she walks roughly 1-2 miles a day.   Social Determinants of Health   Financial Resource Strain: Low Risk  (11/01/2021)   Overall Financial Resource Strain (CARDIA)    Difficulty of Paying Living Expenses: Not hard at all  Food Insecurity: No Food Insecurity (09/01/2020)   Hunger Vital Sign    Worried About Running Out of Food in the Last Year: Never true    Ran Out of Food in the Last Year: Never true  Transportation Needs: No Transportation Needs (11/01/2021)   PRAPARE - Hydrologist (Medical): No    Lack of Transportation (Non-Medical): No  Physical Activity: Not on file  Stress: Stress Concern Present (09/01/2020)   [place]    Feeling of Stress : Rather much  Social Connections: Unknown (09/01/2020)   Social Connection and Isolation Panel [NHANES]    Frequency of Communication with Friends and Family: More than three times a week    Frequency of Social Gatherings with Friends and Family: More than three times a week    Attends Religious Services: More than 4 times per year    Active Member of [employer] or Organizations: No    Attends Archivist Meetings: Never    Marital Status: Not on file  Intimate Partner Violence: Not on file      Family History  Problem Relation Age of Onset   Colon cancer Mother 26       deceased 8   Heart attack Father  Breast cancer Other 52       2nd cousin on maternal side; currently 48   Pancreatic  cancer Other        2nd cousin once removed on maternal side; deceased 72s   Fibromyalgia Sister    Esophageal cancer Neg Hx    Rectal cancer Neg Hx    Stomach cancer Neg Hx     Vitals:   04/01/22 1518  BP: (!) 140/88  Pulse: (!) 101  SpO2: 97%  Weight: 97.5 kg (215 lb)     PHYSICAL EXAM: General:  Well appearing. No respiratory difficulty HEENT: normal Neck: supple. no JVD. Carotids 2+ bilat; no bruits. No lymphadenopathy or thyromegaly appreciated. Cor: PMI nondisplaced. Regular rate & rhythm. No rubs, gallops or murmurs. Lungs: clear Abdomen: soft, nontender, nondistended. No hepatosplenomegaly. No bruits or masses. Good bowel sounds. Extremities: no cyanosis, clubbing, rash, 2+RLE up to hip edema Neuro: alert & oriented x 3, cranial nerves grossly intact. moves all 4 extremities w/o difficulty. Affect pleasant.   ASSESSMENT & PLAN:  1. Metastatic left breast cancer - agree with plan for Enhertu - discussed small risk of reversible cardiotoxicty with Enhertu but benefit seems to clearly outweigh the risks - EF stable on echo today - Continue Enhertu - Follow echos closely.   2. Chronic systolic CHF/?NICM: -Diagnosed in 2016. Unclear etiology -Echo 12/2015 in [place]: EF 18%, RV reduced, moderate MR -Echo 05/2017: EF 45-50% -Echo 08/2017: EF 45-50%, mild LVH, RV okay -Echo 01/31/22 (not on Enhertu) with EF 35-40%, GLS -12.9%, RV mildly reduced - Echo today 04/01/22 EF ~ 40% (read as 45-50%) Personally reviewed  - No prior cMRI or cardiac cath available for review - Doing well. NYHA I-II. Volume status ok. Take lasix as needed - With no anginal symptoms will start with cMRI - Increase carvedilol to 18.75 bid - Continue Entresto 97/103 bid - Continue Spiro 12.5 - Consider SGLT2i soon but won't start with recent dehyrdation episode  3. RLE edema - improved  4. HTN - BP well controlled at home. Mildly elevated here   5. Syncopal episode - sounds like it is due  to volume depletion - if recurs will need Zio    Glori Bickers, MD  3:57 PM

## 2022-04-02 ENCOUNTER — Telehealth: Payer: Self-pay

## 2022-04-02 NOTE — Telephone Encounter (Signed)
Transition Care Management Unsuccessful Follow-up Telephone Call  Date of discharge and from where:  [facility] 03/31/22  Attempts:  1st Attempt  Reason for unsuccessful TCM follow-up call:  Left voice message

## 2022-04-03 ENCOUNTER — Other Ambulatory Visit: Payer: Self-pay

## 2022-04-08 DIAGNOSIS — H209 Unspecified iridocyclitis: Secondary | ICD-10-CM | POA: Diagnosis not present

## 2022-04-08 DIAGNOSIS — H401131 Primary open-angle glaucoma, bilateral, mild stage: Secondary | ICD-10-CM | POA: Diagnosis not present

## 2022-04-08 DIAGNOSIS — H2513 Age-related nuclear cataract, bilateral: Secondary | ICD-10-CM | POA: Diagnosis not present

## 2022-04-08 DIAGNOSIS — H3552 Pigmentary retinal dystrophy: Secondary | ICD-10-CM | POA: Diagnosis not present

## 2022-04-08 DIAGNOSIS — H179 Unspecified corneal scar and opacity: Secondary | ICD-10-CM | POA: Diagnosis not present

## 2022-04-08 DIAGNOSIS — H5010 Unspecified exotropia: Secondary | ICD-10-CM | POA: Diagnosis not present

## 2022-04-08 DIAGNOSIS — E119 Type 2 diabetes mellitus without complications: Secondary | ICD-10-CM | POA: Diagnosis not present

## 2022-04-09 ENCOUNTER — Encounter: Payer: Self-pay | Admitting: Cardiology

## 2022-04-09 ENCOUNTER — Ambulatory Visit: Payer: Medicare Other | Attending: Cardiology | Admitting: Cardiology

## 2022-04-09 VITALS — BP 148/84 | HR 75 | Ht 67.0 in | Wt 211.2 lb

## 2022-04-09 DIAGNOSIS — C50512 Malignant neoplasm of lower-outer quadrant of left female breast: Secondary | ICD-10-CM | POA: Diagnosis not present

## 2022-04-09 DIAGNOSIS — I5042 Chronic combined systolic (congestive) and diastolic (congestive) heart failure: Secondary | ICD-10-CM | POA: Diagnosis not present

## 2022-04-09 DIAGNOSIS — Z171 Estrogen receptor negative status [ER-]: Secondary | ICD-10-CM | POA: Diagnosis not present

## 2022-04-09 DIAGNOSIS — I420 Dilated cardiomyopathy: Secondary | ICD-10-CM | POA: Diagnosis not present

## 2022-04-09 DIAGNOSIS — E782 Mixed hyperlipidemia: Secondary | ICD-10-CM

## 2022-04-09 DIAGNOSIS — I2510 Atherosclerotic heart disease of native coronary artery without angina pectoris: Secondary | ICD-10-CM | POA: Diagnosis not present

## 2022-04-09 DIAGNOSIS — I152 Hypertension secondary to endocrine disorders: Secondary | ICD-10-CM

## 2022-04-09 DIAGNOSIS — I10 Essential (primary) hypertension: Secondary | ICD-10-CM | POA: Diagnosis not present

## 2022-04-09 NOTE — Progress Notes (Unsigned)
Primary Care Provider: [name], Kristin Ward Cardiologist: Glenetta Hew, Kristin Ward Electrophysiologist: None  Clinic Note: No chief complaint on file.   ===================================  ASSESSMENT/PLAN   Problem List Items Addressed This Visit       Cardiology Problems   Chronic combined systolic and diastolic heart failure (HCC) - Primary (Chronic)   Essential hypertension (Chronic)   Hypertension associated with diabetes ([place])   Coronary artery calcification seen on CAT scan (Chronic)     Other   Malignant neoplasm of lower-outer quadrant of left breast of female, estrogen receptor negative ([place])    ===================================  HPI:    Kristin Ward is a 66 y.o. female with a PMH below who presents today for ***.  My last visit with Kristin Ward was in July 2022:  Kristin Ward was just seen on 04/01/2022 by Dr. Haroldine Laws for second follow-up for chemotherapy related cardiomyopathy. Echo 01/31/22 (not on Enhertu) with EF 35-40%, GLS -12.9%, RV mildly reduced   Remains on Enhertu every 3 weeks. Tolerating ok. No CP, SOB or edema. Had syncopal episode last week after treatment. Felt to be dehydrated. Came to ER and got fluids. Felt much better. No recurrence    Echo 04/01/22 EF ~ 40% (read as 45-50%) Personally reviewed    Echo 12/2015 in [place]: EF 18%, RV reduced, moderate MR Echo 05/2017: EF 45-50% Echo 08/2017: EF 45-50%, mild LVH, RV okay  Recent syncopal episode felt to be related to volume depletion /' heat exhaustion with hypokalemia..   Continued plan taking in her to.  Benefits outweigh the risk.  Echo seems stable.  NYHA class I 2 symptoms.  PRN Lasix.  Cardiac MRI planned.  Increased carvedilol to 18.75 twice daily and continue spironolactone 12.5 mg along with Entresto 97/103 mg twice daily.  Consider SGLT2-I but holding off based on recent dehydration.  Recent Hospitalizations:  03/31/2022: ER visit with hypokalemia and heat  exhaustion.  Reviewed  CV studies:    The following studies were reviewed today: (if available, images/films reviewed: From Epic Chart or Care Everywhere) ***:  Interval History:   Kristin Ward   CV Review of Symptoms (Summary) Cardiovascular ROS: {roscv:310661}  REVIEWED OF SYSTEMS   ROS  I have reviewed and (if needed) personally updated the patient's problem list, medications, allergies, past medical and surgical history, social and family history.   PAST MEDICAL HISTORY   Past Medical History:  Diagnosis Date   Abnormal x-ray of lungs with single pulmonary nodule 03/15/2016   per records from [place]    Anemia    Asthma    Cholelithiasis 05/19/2017   On CT   Coronary artery calcification seen on CAT scan 05/19/2017   Diabetes mellitus without complication ([place])    type 2   Diabetes mellitus, new onset ([place]) 01/20/2019   Dilated idiopathic cardiomyopathy ([place]) 12/2015   EF 15-20%. Diagnosed in [facility]   Headache    NONE RECENT   Hypertension    Lymph nodes enlarged 07/25/2020   Malignant neoplasm of lower-outer quadrant of left breast of female, estrogen receptor negative ([place]) 06/11/2017   left breast   Mixed hyperlipidemia 06/17/2018   partially blind    Personal history of chemotherapy 2018-2019   Personal history of radiation therapy 2019   Retinitis pigmentosa    Retroperitoneal lymphadenopathy 07/25/2020   Uveitis     PAST SURGICAL HISTORY   Past Surgical History:  Procedure Laterality Date   brain cyst removed  2007   to help  with headaches per patient , aspirated    BREAST BIOPSY Left 06/06/2017   x2   BREAST CYST ASPIRATION Left 06/06/2017   BREAST LUMPECTOMY Left 06/26/2017   BREAST LUMPECTOMY WITH RADIOACTIVE SEED AND SENTINEL LYMPH NODE BIOPSY Left 06/26/2017   Procedure: BREAST LUMPECTOMY WITH RADIOACTIVE SEED AND SENTINEL LYMPH NODE BIOPSY ERAS  PATHWAY;  Surgeon: [name], Kristin Ward;  Location: [facility];  Service:  General;  Laterality: Left;  pec block   FINE NEEDLE ASPIRATION Left 02/23/2019   Procedure: FINE NEEDLE ASPIRATION;  Surgeon: [name], Kristin Ward;  Location: [facility];  Service: General;  Laterality: Left;  Asiration of left breast seroma    IR IMAGING GUIDED PORT INSERTION  10/13/2020   NASAL TURBINATE REDUCTION     PORT-A-CATH REMOVAL Right 02/23/2019   Procedure: REMOVAL PORT-A-CATH;  Surgeon: [name], Kristin Ward;  Location: [facility];  Service: General;  Laterality: Right;   PORTACATH PLACEMENT N/A 06/26/2017   Procedure: INSERTION PORT-A-CATH;  Surgeon: [name], Kristin Ward;  Location: [facility];  Service: General;  Laterality: N/A;   TRANSTHORACIC ECHOCARDIOGRAM  12/2015   A) [facility] May 2017: Mild concentric LVH. Global hypokinesis. GR daily. EF 18% severe LA dilation. Mitral annular dilatation with papillary muscle dysfunction and moderate MR. Dilated IVC consistent with elevated RAP.   TRANSTHORACIC ECHOCARDIOGRAM  05/2017; 08/2017   a) Mild concentric LVH.  EF 45 to 50% with diffuse hypokinesis.  GR 1 DD.  Mild aortic root dilation.;; b)  Mild LVH EF 45 to 50%.  Diffuse HK.  No significant valve disease.  No significant change.     There is no immunization history on file for this patient.  MEDICATIONS/ALLERGIES   Current Meds  Medication Sig   Accu-Chek FastClix Lancets MISC TEST TWICE A DAY. PT USES AN ACCU-CHEK GUIDE ME METER   amLODipine (NORVASC) 5 MG tablet Take 1 tablet (5 mg total) by mouth daily.   aspirin 81 MG chewable tablet Chew 81 mg by mouth daily.   atorvastatin (LIPITOR) 20 MG tablet Take 1 tablet (20 mg total) by mouth daily.   brimonidine (ALPHAGAN) 0.2 % ophthalmic solution Place 1 drop into both eyes 2 (two) times daily.   carvedilol (COREG) 12.5 MG tablet Take 1.5 tablets (18.75 mg total) by mouth 2 (two) times daily with a meal.   cholecalciferol (VITAMIN D3) 25 MCG (1000 UNIT) tablet Take 2,000 Units by mouth daily.    Continuous Blood Gluc Receiver (DEXCOM G7 RECEIVER) DEVI 1 Device by Does not apply route 2 (two) times daily.   Continuous Blood Gluc Sensor (DEXCOM G7 SENSOR) MISC Use as directed, replace every 10 DAYS   furosemide (LASIX) 20 MG tablet TAKE 2 TABLETS (40 MG TOTAL) BY MOUTH AS NEEDED. FOR WEIGHT GAIN 3 TO 4 LBS.   HUMALOG KWIKPEN 100 UNIT/ML KwikPen Inject 3-10 Units into the skin 3 (three) times daily before meals. Per sliding scale   ibuprofen (ADVIL) 800 MG tablet Take 1 tablet (800 mg total) by mouth every 8 (eight) hours as needed.   Lancets Misc. (ACCU-CHEK SOFTCLIX LANCET DEV) KIT 1 Units by Does not apply route 2 (two) times daily. Check FSBS BID - Include strips # 50 with 5 refills, Lancets #50 with 5 refills DX: Type 2 DM - ICD 10: E11.9   lidocaine-prilocaine (EMLA) cream Apply 1 application topically as needed.   linaclotide (LINZESS) 72 MCG capsule Take 72 mcg by mouth as needed.   Multiple Vitamins-Minerals (WOMENS MULTI PO) Take  15 mLs by mouth daily.   ondansetron (ZOFRAN) 8 MG tablet Take 1 tablet (8 mg total) by mouth every 8 (eight) hours as needed for nausea or vomiting (begin on day 3 after chemo).   oxyCODONE (OXY IR/ROXICODONE) 5 MG immediate release tablet Take 1 tablet (5 mg total) by mouth every 8 (eight) hours as needed for severe pain.   pantoprazole (PROTONIX) 40 MG tablet TAKE 1 TABLET BY MOUTH EVERY DAY   prochlorperazine (COMPAZINE) 10 MG tablet Take 1 tablet (10 mg total) by mouth every 6 (six) hours as needed (Nausea or vomiting).   promethazine (PHENERGAN) 25 MG tablet Take 1 tablet (25 mg total) by mouth every 6 (six) hours as needed for nausea or vomiting.   sacubitril-valsartan (ENTRESTO) 97-103 MG Take 1 tablet by mouth 2 (two) times daily.   spironolactone (ALDACTONE) 25 MG tablet Take 25 mg by mouth daily.   vitamin B-12 (CYANOCOBALAMIN) 1000 MCG tablet Take 1,000 mcg by mouth daily.   vitamin C (ASCORBIC ACID) 500 MG tablet Take 500 mg by mouth in the  morning, at noon, in the evening, and at bedtime.   zinc gluconate 50 MG tablet Take 50 mg by mouth daily.   Current Facility-Administered Medications for the 04/09/22 encounter (Office Visit) with Kristin Man, Kristin Ward  Medication   insulin glulisine (APIDRA) injection 6 Units    Allergies  Allergen Reactions   Morphine And Related Nausea And Vomiting   Latex Hives and Itching    Burning    Tylenol With Codeine #3 [Acetaminophen-Codeine] Nausea And Vomiting   Penicillins Nausea Only, Swelling and Rash    Has patient had a PCN reaction causing immediate rash, facial/tongue/throat swelling, SOB or lightheadedness with hypotension: Yes Has patient had a PCN reaction causing severe rash involving mucus membranes or skin necrosis: Yes Has patient had a PCN reaction that required hospitalization: No Has patient had a PCN reaction occurring within the last 10 years: No TONGUE SWELLING AND RASH AROUND MOUTH If all of the above answers are "NO", then may proceed with Cephalosporin use.     SOCIAL HISTORY/FAMILY HISTORY   Reviewed in Epic:  Pertinent findings:  Social History   Tobacco Use   Smoking status: Former    Packs/day: 0.25    Years: 23.00    Total pack years: 5.75    Types: Cigarettes    Quit date: 10/09/2015    Years since quitting: 6.5   Smokeless tobacco: Never  Vaping Use   Vaping Use: Never used  Substance Use Topics   Alcohol use: No   Drug use: No   Social History   Social History Narrative   She is a widowed mother of 2 (daughter and son).   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   She moved to [place] apparently back in 2016, but was visiting family in [place] and was diagnosed with Cardiomyopathy-EF 18%.   She is usually accompanied by her daughter.    At baseline, she walks roughly 1-2 miles a day.    OBJCTIVE -PE, EKG, labs   Wt Readings from Last 3 Encounters:  04/09/22 211 lb 3.2 oz (95.8 kg)  04/01/22 215 lb (97.5 kg)  03/31/22 216 lb  (98 kg)    Physical Exam: BP (!) 148/84   Pulse 75   Ht '5\' 7"'  (1.702 m)   Wt 211 lb 3.2 oz (95.8 kg)   SpO2 98%   BMI 33.08 kg/m  Physical Exam  Distant heart  sounds.  Trivial swelling. Left extraocular muscles are diminished.   Adult ECG Report  Rate: *** ;  Rhythm: {rhythm:17366};   Narrative Interpretation: ***  Recent Labs:  ***  Lab Results  Component Value Date   CHOL 206 (H) 03/08/2021   HDL 46 03/08/2021   LDLCALC 127 (H) 03/08/2021   TRIG 186 (H) 03/08/2021   CHOLHDL 4.5 (H) 03/08/2021   Lab Results  Component Value Date   CREATININE 0.90 03/31/2022   BUN 16 03/31/2022   NA 141 03/31/2022   K 3.3 (L) 03/31/2022   CL 111 03/31/2022   CO2 23 03/31/2022      Latest Ref Rng & Units 03/31/2022    6:00 PM 03/29/2022    9:40 AM 03/08/2022   10:24 AM  CBC  WBC 4.0 - 10.5 K/uL 5.5  4.4  4.1   Hemoglobin 12.0 - 15.0 g/dL 9.7  10.4  9.8   Hematocrit 36.0 - 46.0 % 31.0  32.1  30.7   Platelets 150 - 400 K/uL 351  376  362     Lab Results  Component Value Date   HGBA1C 12.0 (A) 10/12/2021   Lab Results  Component Value Date   TSH 3.71 06/23/2020    ================================================== I spent a total of ***minutes with the patient spent in direct patient consultation.  Additional time spent with chart review  / charting (studies, outside notes, etc): *** min Total Time: *** min  Current medicines are reviewed at length with the patient today.  (+/- concerns) ***  Notice: This dictation was prepared with Dragon dictation along with smart phrase technology. Any transcriptional errors that result from this process are unintentional and may not be corrected upon review.  Studies Ordered:   No orders of the defined types were placed in this encounter.  No orders of the defined types were placed in this encounter.   Patient Instructions / Medication Changes & Studies & Tests Ordered   There are no Patient Instructions on file for this  visit.     Kristin Man, MD, Kristin Ward Glenetta Hew, M.D., M.S. Interventional Cardiologist  [place]  Pager # [phone] Phone # [phone] [address] [place], [place] [address]   Thank you for choosing [facility] [place]!!

## 2022-04-09 NOTE — Progress Notes (Incomplete)
Primary Care Provider: [name], MD Cardiologist: Glenetta Hew, MD Advanced Heart Failure Cardiologist: Dr. Pierre Bali Electrophysiologist: None Oncologist: Dr. Burr Medico  Clinic Note: No chief complaint on file.   ===================================  ASSESSMENT/PLAN   Problem List Items Addressed This Visit       Cardiology Problems   Chronic combined systolic and diastolic heart failure (HCC) - Primary (Chronic)   Essential hypertension (Chronic)   Hypertension associated with diabetes ([place])   Coronary artery calcification seen on CAT scan (Chronic)     Other   Malignant neoplasm of lower-outer quadrant of left breast of female, estrogen receptor negative ([place])    ===================================  HPI:    Kristin Ward is a 66 y.o. female with a PMH Notable for Recurrent Metastatic Breast Cancer Presumably Chemotherapy Related Nonischemic Cardiomyopathy (with EF improved to 45 to 50% as of July 2022), along with HTN, DM 2 , retinitis pigmentosa who presents today for annual follow-up with me, but 1 week follow-up after being seen in [facility] by Dr. Pierre Bali  2017 -> while living in [place], diagnosed with NONISCHEMIC CARDIOMYOPATHY-EF 18%.  (No report of cardiac catheterization); treated medically. =>  Follow-up echocardiogram in 2019 showed EF 45 to 50%. 2018: L Breast cancer treated with lumpectomy and chemotherapy; had her port removed in July 2020. January 2022-underwent biopsy of mass seen as part of evaluation for intractable right hip pain. --> large infiltrative mass around the right iliac wing --> biopsy-proven metastatic carcinoma (breast primary) => XRT along with Taxol x 4 cycles - stopped 2/2 Neuropathy => converted to Xeloda for suppression  Converted from Xeloda to [place] secondary to progression of disease => this was subsequently discontinued secondary to hyperglycemia => subsequently had stable right iliac  wing lesion, but increasing inguinal lymph nodes and right inguinal node biopsy was consistent with metastatic breast carcinoma -> Palliative XRT => plan was to switch to Enhertu  My last visit with Kristin Ward was in July 2022: Unfortunate, she was diagnosed with yet again recurrent metastatic Breast Cancer.  Apparently She Had Swelling of the Right Leg Related to a Mass, Therefore Undergoing Palliative XRT and Chemotherapy with Xeloda.  Was Not Really Having Any Heart Failure Symptoms.  She Was down to 215 Pounds after Weighing up to 240 Pounds Back in 2021.  This Is Probably Related to Her Diagnosis of Cancer, but also a little bit intentional.  No PND orthopnea or edema.  No palpitations.  No chest pain or pressure.  She has exertional dyspnea and exercise intolerance.  Tired with cancer treatments.  Had lost an additional 11 pounds.  Dealing with issues with constipation and intermittent nausea related to cancer treatments: No changes made.  Med list: Furosemide 20 mg daily (with sliding scale) Carvedilol 12.5 mg twice daily Entresto 97/103 mg twice daily She was supposed to be on spironolactone 25 mg daily (but not taking) Rybelsus 3 mg tabs daily, glargine insulin and metformin   Kristin Ward was just seen on 04/01/2022 by Dr. Haroldine Laws for second follow-up for chemotherapy related cardiomyopathy.  Echo 01/31/22 (not on Enhertu) with EF 35-40%, GLS -12.9%, RV mildly reduced ==> She had been referred to Advanced Heart Failure Clinic/Cardio Oncology (unfortunately I do not think she realized that [facility] is an extension of our practice, and did not want to make any medication adjustments until she talked to me as her primary cardiologist-the but she did not mention this to Dr. Haroldine Laws.) At  that time she was on Entresto max dose, carvedilol 12.5 mg twice daily and spironolactone 25 mg daily.  Had some right lower extremity swelling related to cancer but no real edema.  No  chest pain pressure or significant dyspnea.  Noted elevated blood pressure at times. .  Dr. Clayborne Dana recommendation: Agree with Enhertu.  Discussed risk for cardiotoxicity but likely benefits outweigh risks.  Plan was to recheck 2D echo in 2 months.  Plan was to consider SGLT2 inhibitor soon. Cardiac MRI checked and amlodipine 5 mg added due to poorly controlled hypertension.  Unfortunately the cardiac MRI has not been done.   Remains on Enhertu every 3 weeks. Tolerating ok. No CP, SOB or edema. Had syncopal episode last week after treatment. Felt to be dehydrated. Came to ER and got fluids. Felt much better. No recurrence    Echo 04/01/22 EF ~ 40% (read as 45-50%) Personally reviewed    Echo 12/2015 in [place]: EF 18%, RV reduced, moderate MR Echo 05/2017: EF 45-50%, Echo 08/2017: EF 45-50%, mild LVH, RV okay  Recent syncopal episode felt to be related to volume depletion /' heat exhaustion with hypokalemia..   Continued plan taking in her to.  Benefits outweigh the risk.  Echo seems stable.  NYHA class I 2 symptoms.  PRN Lasix.  Cardiac MRI planned.  Increased carvedilol to 18.75 twice daily and continue spironolactone 12.5 mg along with Entresto 97/103 mg twice daily.  Consider SGLT2-I but holding off based on recent dehydration.  Recent Hospitalizations:  03/31/2022: ER visit with hypokalemia and heat exhaustion.  Reviewed  CV studies:    The following studies were reviewed today: (if available, images/films reviewed: From Epic Chart or Care Everywhere) ***:  Interval History:   Kristin Ward   CV Review of Symptoms (Summary) Cardiovascular ROS: {roscv:310661}  REVIEWED OF SYSTEMS   ROS  I have reviewed and (if needed) personally updated the patient's problem list, medications, allergies, past medical and surgical history, social and family history.   PAST MEDICAL HISTORY   Past Medical History:  Diagnosis Date  . Abnormal x-ray of lungs with single pulmonary  nodule 03/15/2016   per records from [place]   . Anemia   . Asthma   . Cholelithiasis 05/19/2017   On CT  . Coronary artery calcification seen on CAT scan 05/19/2017  . Diabetes mellitus without complication ([place])    type 2  . Diabetes mellitus, new onset ([place]) 01/20/2019  . 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 Diagnosed in [facility]  . Headache    NONE RECENT  . Hypertension   . Lymph nodes enlarged 07/25/2020  . Malignant neoplasm of lower-outer quadrant of left breast of female, estrogen receptor negative ([place]) 06/11/2017   left breast  . Mixed hyperlipidemia 06/17/2018  . partially blind   . Personal history of chemotherapy 2018-2019  . Personal history of radiation therapy 2019  . Retinitis pigmentosa   . Retroperitoneal lymphadenopathy 07/25/2020  . Uveitis     PAST SURGICAL HISTORY   Past Surgical History:  Procedure Laterality Date  . brain cyst removed  2007   to help with headaches per patient , aspirated   . BREAST BIOPSY Left 06/06/2017   x2  . BREAST CYST ASPIRATION Left 06/06/2017  . BREAST LUMPECTOMY Left 06/26/2017  . BREAST LUMPECTOMY WITH RADIOACTIVE SEED AND SENTINEL LYMPH NODE BIOPSY Left 06/26/2017   Procedure: BREAST LUMPECTOMY WITH RADIOACTIVE SEED AND SENTINEL LYMPH NODE BIOPSY ERAS  PATHWAY;  Surgeon:  [name], MD;  Location: [name];  Service: General;  Laterality: Left;  pec block  . FINE NEEDLE ASPIRATION Left 02/23/2019   Procedure: FINE NEEDLE ASPIRATION;  Surgeon: [name], MD;  Location: [place];  Service: General;  Laterality: Left;  Asiration of left breast seroma   . IR IMAGING GUIDED PORT INSERTION  10/13/2020  . NASAL TURBINATE REDUCTION    . PORT-A-CATH REMOVAL Right 02/23/2019   Procedure: REMOVAL PORT-A-CATH;  Surgeon: [name], MD;  Location: [place];  Service: General;  Laterality: Right;  . PORTACATH PLACEMENT N/A 06/26/2017   Procedure:  INSERTION PORT-A-CATH;  Surgeon: [name], MD;  Location: [place];  Service: General;  Laterality: N/A;  . TRANSTHORACIC ECHOCARDIOGRAM  12/2015   A) [facility] May 2017: Mild concentric LVH. Global hypokinesis. GR daily. EF 18% severe LA dilation. Mitral annular dilatation with papillary muscle dysfunction and moderate MR. Dilated IVC consistent with elevated RAP.  Marland Kitchen TRANSTHORACIC ECHOCARDIOGRAM  05/2017; 08/2017   a) Mild concentric LVH.  EF 45 to 50% with diffuse hypokinesis.  GR 1 DD.  Mild aortic root dilation.;; b)  Mild LVH EF 45 to 50%.  Diffuse HK.  No significant valve disease.  No significant change.     There is no immunization history on file for this patient.  MEDICATIONS/ALLERGIES   Current Meds  Medication Sig  . Accu-Chek FastClix Lancets MISC TEST TWICE A DAY. PT USES AN ACCU-CHEK GUIDE ME METER  . amLODipine (NORVASC) 5 MG tablet Take 1 tablet (5 mg total) by mouth daily.  Marland Kitchen aspirin 81 MG chewable tablet Chew 81 mg by mouth daily.  Marland Kitchen atorvastatin (LIPITOR) 20 MG tablet Take 1 tablet (20 mg total) by mouth daily.  . brimonidine (ALPHAGAN) 0.2 % ophthalmic solution Place 1 drop into both eyes 2 (two) times daily.  . carvedilol (COREG) 12.5 MG tablet Take 1.5 tablets (18.75 mg total) by mouth 2 (two) times daily with a meal.  . cholecalciferol (VITAMIN D3) 25 MCG (1000 UNIT) tablet Take 2,000 Units by mouth daily.  . Continuous Blood Gluc Receiver (DEXCOM G7 RECEIVER) DEVI 1 Device by Does not apply route 2 (two) times daily.  . Continuous Blood Gluc Sensor (DEXCOM G7 SENSOR) MISC Use as directed, replace every 10 DAYS  . furosemide (LASIX) 20 MG tablet TAKE 2 TABLETS (40 MG TOTAL) BY MOUTH AS NEEDED. FOR WEIGHT GAIN 3 TO 4 LBS.  Marland Kitchen HUMALOG KWIKPEN 100 UNIT/ML KwikPen Inject 3-10 Units into the skin 3 (three) times daily before meals. Per sliding scale  . ibuprofen (ADVIL) 800 MG tablet Take 1 tablet (800 mg total) by mouth every 8 (eight) hours as needed.  .  Lancets Misc. (ACCU-CHEK SOFTCLIX LANCET DEV) KIT 1 Units by Does not apply route 2 (two) times daily. Check FSBS BID - Include strips # 50 with 5 refills, Lancets #50 with 5 refills DX: Type 2 DM - ICD 10: E11.9  . lidocaine-prilocaine (EMLA) cream Apply 1 application topically as needed.  . linaclotide (LINZESS) 72 MCG capsule Take 72 mcg by mouth as needed.  . Multiple Vitamins-Minerals (WOMENS MULTI PO) Take 15 mLs by mouth daily.  . ondansetron (ZOFRAN) 8 MG tablet Take 1 tablet (8 mg total) by mouth every 8 (eight) hours as needed for nausea or vomiting (begin on day 3 after chemo).  Marland Kitchen oxyCODONE (OXY IR/ROXICODONE) 5 MG immediate release tablet Take 1 tablet (5 mg total) by mouth every 8 (eight) hours as needed for  severe pain.  . pantoprazole (PROTONIX) 40 MG tablet TAKE 1 TABLET BY MOUTH EVERY DAY  . prochlorperazine (COMPAZINE) 10 MG tablet Take 1 tablet (10 mg total) by mouth every 6 (six) hours as needed (Nausea or vomiting).  . promethazine (PHENERGAN) 25 MG tablet Take 1 tablet (25 mg total) by mouth every 6 (six) hours as needed for nausea or vomiting.  . sacubitril-valsartan (ENTRESTO) 97-103 MG Take 1 tablet by mouth 2 (two) times daily.  Marland Kitchen spironolactone (ALDACTONE) 25 MG tablet Take 25 mg by mouth daily.  . vitamin B-12 (CYANOCOBALAMIN) 1000 MCG tablet Take 1,000 mcg by mouth daily.  . vitamin C (ASCORBIC ACID) 500 MG tablet Take 500 mg by mouth in the morning, at noon, in the evening, and at bedtime.  Marland Kitchen zinc gluconate 50 MG tablet Take 50 mg by mouth daily.   Current Facility-Administered Medications for the 04/09/22 encounter (Office Visit) with [name], MD  Medication  . insulin glulisine (APIDRA) injection 6 Units    Allergies  Allergen Reactions  . Morphine And Related Nausea And Vomiting  . Latex Hives and Itching    Burning   . Tylenol With Codeine #3 [Acetaminophen-Codeine] Nausea And Vomiting  . Penicillins Nausea Only, Swelling and Rash    Has patient  had a PCN reaction causing immediate rash, facial/tongue/throat swelling, SOB or lightheadedness with hypotension: Yes Has patient had a PCN reaction causing severe rash involving mucus membranes or skin necrosis: Yes Has patient had a PCN reaction that required hospitalization: No Has patient had a PCN reaction occurring within the last 10 years: No TONGUE SWELLING AND RASH AROUND MOUTH If all of the above answers are "NO", then may proceed with Cephalosporin use.     SOCIAL HISTORY/FAMILY HISTORY   Reviewed in Epic:  Pertinent findings:  Social History   Tobacco Use  . Smoking status: Former    Packs/day: 0.25    Years: 23.00    Total pack years: 5.75    Types: Cigarettes    Quit date: 10/09/2015    Years since quitting: 6.5  . Smokeless tobacco: Never  Vaping Use  . Vaping Use: Never used  Substance Use Topics  . Alcohol use: No  . Drug use: No   Social History   Social History Narrative   She is a widowed mother of 2 (daughter and son).   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   She moved to [place] apparently back in 2016, but was visiting family in [place] and was diagnosed with Cardiomyopathy-EF 18%.   She is usually accompanied by her daughter.    At baseline, she walks roughly 1-2 miles a day.    OBJCTIVE -PE, EKG, labs   Wt Readings from Last 3 Encounters:  04/09/22 211 lb 3.2 oz (95.8 kg)  04/01/22 215 lb (97.5 kg)  03/31/22 216 lb (98 kg)    Physical Exam: BP (!) 148/84   Pulse 75   Ht '5\' 7"'  (1.702 m)   Wt 211 lb 3.2 oz (95.8 kg)   SpO2 98%   BMI 33.08 kg/m  Physical Exam  Distant heart sounds.  Trivial swelling. Left extraocular muscles are diminished.   Adult ECG Report  Rate: *** ;  Rhythm: {rhythm:17366};   Narrative Interpretation: ***  Recent Labs:  ***  Lab Results  Component Value Date   CHOL 206 (H) 03/08/2021   HDL 46 03/08/2021   LDLCALC 127 (H) 03/08/2021   TRIG 186 (H) 03/08/2021  CHOLHDL 4.5 (H) 03/08/2021    Lab Results  Component Value Date   CREATININE 0.90 03/31/2022   BUN 16 03/31/2022   NA 141 03/31/2022   K 3.3 (L) 03/31/2022   CL 111 03/31/2022   CO2 23 03/31/2022      Latest Ref Rng & Units 03/31/2022    6:00 PM 03/29/2022    9:40 AM 03/08/2022   10:24 AM  CBC  WBC 4.0 - 10.5 K/uL 5.5  4.4  4.1   Hemoglobin 12.0 - 15.0 g/dL 9.7  10.4  9.8   Hematocrit 36.0 - 46.0 % 31.0  32.1  30.7   Platelets 150 - 400 K/uL 351  376  362     Lab Results  Component Value Date   HGBA1C 12.0 (A) 10/12/2021   Lab Results  Component Value Date   TSH 3.71 06/23/2020    ================================================== I spent a total of ***minutes with the patient spent in direct patient consultation.  Additional time spent with chart review  / charting (studies, outside notes, etc): *** min Total Time: *** min  Current medicines are reviewed at length with the patient today.  (+/- concerns) ***  Notice: This dictation was prepared with Dragon dictation along with smart phrase technology. Any transcriptional errors that result from this process are unintentional and may not be corrected upon review.  Studies Ordered:   No orders of the defined types were placed in this encounter.  No orders of the defined types were placed in this encounter.   Patient Instructions / Medication Changes & Studies & Tests Ordered   There are no Patient Instructions on file for this visit.     [name], MD, MS Glenetta Hew, M.D., M.S. Interventional Cardiologist  [place]  Pager # [phone] Phone # [phone] [address] [place], [place] [address]   Thank you for choosing [facility]!!

## 2022-04-09 NOTE — Patient Instructions (Addendum)
Medication Instructions:    Restart taking Spironolactone 25 mg  daily    Restart Furosemide  40 mg ( 2 tablets of 20 mg ) as needed  daily for weight gain 3 lb or greater overnight    Increase carvedilol 18.75 mg take one tablet  twice a day - take medication and check your blood pressure for one week if your blood pressure is 135/80 or greater  than 1/2 tablet of 5 mg Amlodipine - continue with blood pressure check for one week if 135/80  increase to total  5 mg Amlodipine.   *If you need a refill on your cardiac medications before your next appointment, please call your pharmacy*   Lab Work: Not needed    Testing/Procedures:  Not needed  Follow-Up: At [facility], you and your health needs are our priority.  As part of our continuing mission to provide you with exceptional heart care, we have created designated Provider Care Teams.  These Care Teams include your primary Cardiologist (physician) and Advanced Practice Providers (APPs -  Physician Assistants and Nurse Practitioners) who all work together to provide you with the care you need, when you need it.  We recommend signing up for the patient portal called "MyChart".  Sign up information is provided on this After Visit Summary.  MyChart is used to connect with patients for Virtual Visits (Telemedicine).  Patients are able to view lab/test results, encounter notes, upcoming appointments, etc.  Non-urgent messages can be sent to your provider as well.   To learn more about what you can do with MyChart, go to [email or web address].    Your next appointment:   6 month(s)  The format for your next appointment:   In Person  Provider:   Glenetta Hew, MD    Other Instructions   Follow instruction from Dr Haroldine Laws with medication changes - let him know if you are not taking medications listed

## 2022-04-10 ENCOUNTER — Encounter: Payer: Self-pay | Admitting: Cardiology

## 2022-04-10 ENCOUNTER — Other Ambulatory Visit: Payer: Self-pay

## 2022-04-10 NOTE — Assessment & Plan Note (Signed)
Cardiac schedule noted with nonischemic stress test.  Would like to target LDL less than 70.  She is on atorvastatin, but has not had them checked in over a year.  Would recommend that she get lipids checked as part of her routine surveillance labs checked for cancer or her see clinic follow-up.  She is on aspirin, beta-blocker and ARNI as well as calcium channel blocker potentially.  At present, as there is multiple other issues that she is dealing with.  Lipid management likely be secondary.

## 2022-04-10 NOTE — Assessment & Plan Note (Signed)
NYHA class I-II symptoms.  I think she is probably okay doing PRN Lasix and restarting her spironolactone.  Titrate up carvedilol as indicated

## 2022-04-10 NOTE — Assessment & Plan Note (Signed)
Still not adequately controlled, but has not been taking the medications that were previously increased.  She has not taken any amlodipine nor has she taken the increased dose of carvedilol.  She actually has not been taking spironolactone.  Plan for now berry restart spironolactone and then after 1 week titrate up carvedilol to 1-1/2 tablet (18.75 mg) twice daily. She will reassess pressures after 1 week on this regimen, if still not at goal (135/80) she will initiate the amlodipine at 1/2 tablet daily and again reassess after 1 week.  Go up to 5 mg if still not at goal. This will pretty much have her on a regimen desired by Dr. Haroldine Laws prior to next visit in Nov.

## 2022-04-10 NOTE — Assessment & Plan Note (Addendum)
I previously had LDL down to 66 while on statin.  Was off of it as of the most recent visits.  Now back listed.  Needs follow-up FLP checked that should be able to be checked  with her routine cancer blood draws.  Potentially could be drawn and Advanced Heart Failure Clinic.

## 2022-04-10 NOTE — Assessment & Plan Note (Addendum)
Stable NYHA Class I-II CHF Symptoms. Initially had a reduced EF of 35 to 40% in June but follow-up echo showed slightly improved.  Probably not that much improved, but at least stable.  Fortunately, she was not aware of the fact that medication regimen recommendations by Dr. Haroldine Laws are essentially the same is coming from me as he is the expert in this field.  She has not made any of the medication adjustments.  Her blood pressure is not as high as it was when he initially started the amlodipine as she has had issues with amlodipine in the past.  She has been on standing Lasix, but does not always take it.  I think as long as she is able to maintain hydration is okay to restart along with her spironolactone.  Would easily convert Lasix to simply PRN.  Plan:  Continue Entresto 97-103 mg twice daily  Restart spironolactone 25 mg  Restart furosemide 2 tablets as needed daily for weight gain greater than 3 pounds overnight  Otherwise she needs to make sure she is maintaining adequate hydration.  For now we will continue carvedilol at 12.5 mg twice daily  After 1 week back on spironolactone, reassess blood pressures, if still elevated over 130/80 increase Carvedilol to 1.5 tabs (18.75 mg) twice daily  If after 1 week on higher dose of carvedilol, blood pressures continue to be over 135/80, then initiated amlodipine 2.5 mg (one half of the 5 mg tablet) daily.  Again reassess in 1 week-if still elevated above target, increase to 5 mg.  Unfortunately, nothing JUST will be made prior to her cardiac MRI that has been ordered.  Going forward she will continue to follow-up with Dr. Haroldine Laws and I will see her intermittently, but defer management to him.  I explained that while she is on the chemotherapy regimen, she will be following up with the advanced heart failure clinic for management of her heart failure.  They have the ability to see her closer follow-up and make tighter adjustments to her  medications then I can based on my clinic scheduling.

## 2022-04-16 ENCOUNTER — Ambulatory Visit (INDEPENDENT_AMBULATORY_CARE_PROVIDER_SITE_OTHER): Payer: Medicare Other | Admitting: Podiatry

## 2022-04-16 DIAGNOSIS — C801 Malignant (primary) neoplasm, unspecified: Secondary | ICD-10-CM

## 2022-04-16 DIAGNOSIS — R609 Edema, unspecified: Secondary | ICD-10-CM | POA: Insufficient documentation

## 2022-04-16 DIAGNOSIS — M79674 Pain in right toe(s): Secondary | ICD-10-CM

## 2022-04-16 DIAGNOSIS — M79675 Pain in left toe(s): Secondary | ICD-10-CM

## 2022-04-16 DIAGNOSIS — G63 Polyneuropathy in diseases classified elsewhere: Secondary | ICD-10-CM

## 2022-04-16 DIAGNOSIS — B351 Tinea unguium: Secondary | ICD-10-CM

## 2022-04-16 NOTE — Progress Notes (Signed)
This patient presents to the office to have her nails trimmed.  She says the nails are painful walking and wearing her shoes.  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 She presents for evaluation and treatment of her nails.  General Appearance  Alert, conversant and in no acute stress.  Vascular  Dorsalis pedis and posterior tibial  pulses are palpable  bilaterally.  Capillary return is within normal limits  bilaterally. Temperature is within normal limits  bilaterally.  Neurologic  Senn-Weinstein monofilament wire test diminished  bilaterally. Muscle power within normal limits bilaterally.  Nails Thick disfigured discolored nails with subungual debris  from hallux to fifth toes bilaterally. No evidence of bacterial infection or drainage bilaterally.  Orthopedic  No limitations of motion  feet .  No crepitus or effusions noted.  No bony pathology or digital deformities noted. Swelling right foot dorsally.  Skin  normotropic skin with no porokeratosis noted bilaterally.  No signs of infections or ulcers noted.     Onychomycosis  Debride nails with nail nipper followed by dremel tool usage. Dispense compression sock    RTC 10 weeks    [name] DPM

## 2022-04-17 ENCOUNTER — Telehealth: Payer: Self-pay | Admitting: Hematology

## 2022-04-17 NOTE — Telephone Encounter (Signed)
Left message with follow-up appointments per appointment request workqueue. 

## 2022-04-18 ENCOUNTER — Telehealth: Payer: Self-pay | Admitting: Cardiology

## 2022-04-18 ENCOUNTER — Telehealth (HOSPITAL_COMMUNITY): Payer: Self-pay | Admitting: Emergency Medicine

## 2022-04-18 ENCOUNTER — Other Ambulatory Visit: Payer: Self-pay | Admitting: Family Medicine

## 2022-04-18 DIAGNOSIS — E782 Mixed hyperlipidemia: Secondary | ICD-10-CM

## 2022-04-18 DIAGNOSIS — I10 Essential (primary) hypertension: Secondary | ICD-10-CM

## 2022-04-18 DIAGNOSIS — I5042 Chronic combined systolic (congestive) and diastolic (congestive) heart failure: Secondary | ICD-10-CM

## 2022-04-18 NOTE — Telephone Encounter (Signed)
Reaching out to patient to offer assistance regarding upcoming cardiac imaging study; pt verbalizes understanding of appt date/time, parking situation and where to check in, pre-test NPO status and medications ordered, and verified current allergies; name and call back number provided for further questions should they arise Marchia Bond RN Navigator Cardiac Imaging [facility] [phone] office [phone] cell  Arrival 730 w/c entrnace Denies claustro Denies metal - will wait to place G7 after exam Denies iv issues (has port)

## 2022-04-18 NOTE — Telephone Encounter (Signed)
Attempted to call patient regarding upcoming cardiac MR appointment. Left message on voicemail with name and callback number Handy Mcloud RN Navigator Cardiac Imaging [facility] [phone] Office [phone] Cell  

## 2022-04-18 NOTE — Telephone Encounter (Signed)
*  STAT* If patient is at the pharmacy, call can be transferred to refill team.   1. Which medications need to be refilled? (please list name of each medication and dose if known) new prescription for  2. Which pharmacy/location (including street and city if local pharmacy) is medication to be sent to? My Pharmacy  3. Do they need a 30 day or 90 day supply? 90 days and refills

## 2022-04-18 NOTE — Telephone Encounter (Signed)
Lvm for pt to call back and advise if she is still taking .

## 2022-04-19 ENCOUNTER — Ambulatory Visit (HOSPITAL_BASED_OUTPATIENT_CLINIC_OR_DEPARTMENT_OTHER)
Admission: RE | Admit: 2022-04-19 | Discharge: 2022-04-19 | Disposition: A | Discharge status: Home / Self Care | Payer: Medicare Other | Source: Ambulatory Visit | Attending: Internal Medicine | Admitting: Internal Medicine

## 2022-04-19 ENCOUNTER — Ambulatory Visit (HOSPITAL_COMMUNITY)
Admission: RE | Admit: 2022-04-19 | Discharge: 2022-04-19 | Disposition: A | Discharge status: Home / Self Care | Payer: Medicare Other | Source: Ambulatory Visit | Attending: Internal Medicine | Admitting: Internal Medicine

## 2022-04-19 ENCOUNTER — Encounter (HOSPITAL_COMMUNITY): Payer: Self-pay

## 2022-04-19 ENCOUNTER — Ambulatory Visit (HOSPITAL_BASED_OUTPATIENT_CLINIC_OR_DEPARTMENT_OTHER)
Admission: RE | Admit: 2022-04-19 | Discharge: 2022-04-19 | Disposition: A | Discharge status: Home / Self Care | Payer: Medicare Other | Source: Ambulatory Visit | Attending: Hematology | Admitting: Hematology

## 2022-04-19 ENCOUNTER — Inpatient Hospital Stay (HOSPITAL_BASED_OUTPATIENT_CLINIC_OR_DEPARTMENT_OTHER): Payer: Medicare Other | Admitting: Hematology

## 2022-04-19 ENCOUNTER — Inpatient Hospital Stay: Payer: Medicare Other

## 2022-04-19 ENCOUNTER — Other Ambulatory Visit: Payer: Self-pay

## 2022-04-19 ENCOUNTER — Inpatient Hospital Stay: Payer: Medicare Other | Attending: Hematology

## 2022-04-19 ENCOUNTER — Other Ambulatory Visit (HOSPITAL_COMMUNITY): Payer: Self-pay | Admitting: Internal Medicine

## 2022-04-19 ENCOUNTER — Encounter: Payer: Self-pay | Admitting: Hematology

## 2022-04-19 VITALS — BP 139/82 | HR 76 | Resp 17

## 2022-04-19 VITALS — BP 140/74 | HR 88 | Temp 98.3°F | Resp 16 | Wt 217.9 lb

## 2022-04-19 DIAGNOSIS — I5042 Chronic combined systolic (congestive) and diastolic (congestive) heart failure: Secondary | ICD-10-CM | POA: Diagnosis not present

## 2022-04-19 DIAGNOSIS — Z95828 Presence of other vascular implants and grafts: Secondary | ICD-10-CM

## 2022-04-19 DIAGNOSIS — G620 Drug-induced polyneuropathy: Secondary | ICD-10-CM | POA: Diagnosis not present

## 2022-04-19 DIAGNOSIS — I420 Dilated cardiomyopathy: Secondary | ICD-10-CM

## 2022-04-19 DIAGNOSIS — C50512 Malignant neoplasm of lower-outer quadrant of left female breast: Secondary | ICD-10-CM | POA: Diagnosis not present

## 2022-04-19 DIAGNOSIS — R1032 Left lower quadrant pain: Secondary | ICD-10-CM | POA: Insufficient documentation

## 2022-04-19 DIAGNOSIS — Z5112 Encounter for antineoplastic immunotherapy: Secondary | ICD-10-CM | POA: Insufficient documentation

## 2022-04-19 DIAGNOSIS — Z7189 Other specified counseling: Secondary | ICD-10-CM

## 2022-04-19 DIAGNOSIS — Z170 Estrogen receptor positive status [ER+]: Secondary | ICD-10-CM | POA: Insufficient documentation

## 2022-04-19 DIAGNOSIS — Z171 Estrogen receptor negative status [ER-]: Secondary | ICD-10-CM

## 2022-04-19 DIAGNOSIS — E559 Vitamin D deficiency, unspecified: Secondary | ICD-10-CM | POA: Diagnosis not present

## 2022-04-19 DIAGNOSIS — C7951 Secondary malignant neoplasm of bone: Secondary | ICD-10-CM | POA: Insufficient documentation

## 2022-04-19 DIAGNOSIS — I110 Hypertensive heart disease with heart failure: Secondary | ICD-10-CM | POA: Insufficient documentation

## 2022-04-19 LAB — CBC WITH DIFFERENTIAL (CANCER CENTER ONLY)
Abs Immature Granulocytes: 0.04 10*3/uL (ref 0.00–0.07)
Basophils Absolute: 0 10*3/uL (ref 0.0–0.1)
Basophils Relative: 0 %
Eosinophils Absolute: 0.2 10*3/uL (ref 0.0–0.5)
Eosinophils Relative: 3 %
HCT: 31.4 % — ABNORMAL LOW (ref 36.0–46.0)
Hemoglobin: 10.2 g/dL — ABNORMAL LOW (ref 12.0–15.0)
Immature Granulocytes: 1 %
Lymphocytes Relative: 12 %
Lymphs Abs: 0.6 10*3/uL — ABNORMAL LOW (ref 0.7–4.0)
MCH: 29.5 pg (ref 26.0–34.0)
MCHC: 32.5 g/dL (ref 30.0–36.0)
MCV: 90.8 fL (ref 80.0–100.0)
Monocytes Absolute: 0.6 10*3/uL (ref 0.1–1.0)
Monocytes Relative: 12 %
Neutro Abs: 3.7 10*3/uL (ref 1.7–7.7)
Neutrophils Relative %: 72 %
Platelet Count: 362 10*3/uL (ref 150–400)
RBC: 3.46 MIL/uL — ABNORMAL LOW (ref 3.87–5.11)
RDW: 17.6 % — ABNORMAL HIGH (ref 11.5–15.5)
WBC Count: 5.1 10*3/uL (ref 4.0–10.5)
nRBC: 0 % (ref 0.0–0.2)

## 2022-04-19 LAB — CMP (CANCER CENTER ONLY)
ALT: 14 U/L (ref 0–44)
AST: 21 U/L (ref 15–41)
Albumin: 3.5 g/dL (ref 3.5–5.0)
Alkaline Phosphatase: 85 U/L (ref 38–126)
Anion gap: 4 — ABNORMAL LOW (ref 5–15)
BUN: 10 mg/dL (ref 8–23)
CO2: 28 mmol/L (ref 22–32)
Calcium: 9 mg/dL (ref 8.9–10.3)
Chloride: 108 mmol/L (ref 98–111)
Creatinine: 0.73 mg/dL (ref 0.44–1.00)
GFR, Estimated: >60 mL/min (ref >60)
Glucose, Bld: 183 mg/dL — ABNORMAL HIGH (ref 70–99)
Potassium: 3.8 mmol/L (ref 3.5–5.1)
Sodium: 140 mmol/L (ref 135–145)
Total Bilirubin: 0.4 mg/dL (ref 0.3–1.2)
Total Protein: 7 g/dL (ref 6.5–8.1)

## 2022-04-19 MED ORDER — ACETAMINOPHEN 325 MG PO TABS
650.0000 mg | ORAL_TABLET | Freq: Once | ORAL | Status: AC
  Administered 2022-04-19: 650 mg via ORAL
  Filled 2022-04-19: qty 2

## 2022-04-19 MED ORDER — CARVEDILOL 12.5 MG PO TABS: 18.7500 mg | ORAL_TABLET | Freq: Two times a day (BID) | ORAL | 10 refills | Status: DC

## 2022-04-19 MED ORDER — DEXTROSE 5 % IV SOLN: Freq: Once | INTRAVENOUS | Status: AC

## 2022-04-19 MED ORDER — AMLODIPINE BESYLATE 5 MG PO TABS: 5.0000 mg | ORAL_TABLET | Freq: Every day | ORAL | 10 refills | Status: DC

## 2022-04-19 MED ORDER — SACUBITRIL-VALSARTAN 97-103 MG PO TABS: 1.0000 | ORAL_TABLET | Freq: Two times a day (BID) | ORAL | 10 refills | Status: DC

## 2022-04-19 MED ORDER — ATORVASTATIN CALCIUM 20 MG PO TABS: ORAL_TABLET | ORAL | 10 refills | Status: DC

## 2022-04-19 MED ORDER — SODIUM CHLORIDE 0.9% FLUSH
10.0000 mL | INTRAVENOUS | Status: DC | PRN
  Administered 2022-04-19: 10 mL via INTRAVENOUS

## 2022-04-19 MED ORDER — FUROSEMIDE 20 MG PO TABS: ORAL_TABLET | ORAL | 10 refills | Status: DC

## 2022-04-19 MED ORDER — SODIUM CHLORIDE 0.9 % IV SOLN
150.0000 mg | Freq: Once | INTRAVENOUS | Status: AC
  Administered 2022-04-19: 150 mg via INTRAVENOUS
  Filled 2022-04-19: qty 150

## 2022-04-19 MED ORDER — HEPARIN SOD (PORK) LOCK FLUSH 100 UNIT/ML IV SOLN
500.0000 [IU] | Freq: Once | INTRAVENOUS | Status: AC | PRN
  Administered 2022-04-19: 500 [IU]

## 2022-04-19 MED ORDER — SPIRONOLACTONE 25 MG PO TABS: 25.0000 mg | ORAL_TABLET | Freq: Every day | ORAL | 10 refills | Status: DC

## 2022-04-19 MED ORDER — GADOBUTROL 1 MMOL/ML IV SOLN
9.6000 mL | Freq: Once | INTRAVENOUS | Status: AC | PRN
Start: 2022-04-19 — End: 2022-04-19
  Administered 2022-04-19: 9.6 mL via INTRAVENOUS

## 2022-04-19 MED ORDER — DIPHENHYDRAMINE HCL 25 MG PO CAPS
50.0000 mg | ORAL_CAPSULE | Freq: Once | ORAL | Status: AC
  Administered 2022-04-19: 50 mg via ORAL
  Filled 2022-04-19: qty 2

## 2022-04-19 MED ORDER — SODIUM CHLORIDE 0.9% FLUSH
10.0000 mL | INTRAVENOUS | Status: DC | PRN
  Administered 2022-04-19: 10 mL

## 2022-04-19 MED ORDER — SODIUM CHLORIDE 0.9 % IV SOLN
10.0000 mg | Freq: Once | INTRAVENOUS | Status: AC
  Administered 2022-04-19: 10 mg via INTRAVENOUS
  Filled 2022-04-19: qty 10

## 2022-04-19 MED ORDER — PALONOSETRON HCL INJECTION 0.25 MG/5ML
0.2500 mg | Freq: Once | INTRAVENOUS | Status: AC
  Administered 2022-04-19: 0.25 mg via INTRAVENOUS
  Filled 2022-04-19: qty 5

## 2022-04-19 MED ORDER — FAM-TRASTUZUMAB DERUXTECAN-NXKI CHEMO 100 MG IV SOLR
5.0500 mg/kg | Freq: Once | INTRAVENOUS | Status: AC
  Administered 2022-04-19: 500 mg via INTRAVENOUS
  Filled 2022-04-19: qty 25

## 2022-04-19 NOTE — Patient Instructions (Signed)
[name]  Discharge Instructions: Thank you for choosing [facility] to provide your oncology and hematology care.   If you have a lab appointment with the Cancer Center, please go directly to the Cancer Center and check in at the registration area.   Wear comfortable clothing and clothing appropriate for easy access to any Portacath or PICC line.   We strive to give you quality time with your provider. You may need to reschedule your appointment if you arrive late (15 or more minutes).  Arriving late affects you and other patients whose appointments are after yours.  Also, if you miss three or more appointments without notifying the office, you may be dismissed from the clinic at the provider's discretion.      For prescription refill requests, have your pharmacy contact our office and allow 72 hours for refills to be completed.    Today you received the following chemotherapy and/or immunotherapy agents: [email or web address]      To help prevent nausea and vomiting after your treatment, we encourage you to take your nausea medication as directed.  BELOW ARE SYMPTOMS THAT SHOULD BE REPORTED IMMEDIATELY: *FEVER GREATER THAN 100.4 F (38 C) OR HIGHER *CHILLS OR SWEATING *NAUSEA AND VOMITING THAT IS NOT CONTROLLED WITH YOUR NAUSEA MEDICATION *UNUSUAL SHORTNESS OF BREATH *UNUSUAL BRUISING OR BLEEDING *URINARY PROBLEMS (pain or burning when urinating, or frequent urination) *BOWEL PROBLEMS (unusual diarrhea, constipation, pain near the anus) TENDERNESS IN MOUTH AND THROAT WITH OR WITHOUT PRESENCE OF ULCERS (sore throat, sores in mouth, or a toothache) UNUSUAL RASH, SWELLING OR PAIN  UNUSUAL VAGINAL DISCHARGE OR ITCHING   Items with * indicate a potential emergency and should be followed up as soon as possible or go to the Emergency Department if any problems should occur.  Please show the CHEMOTHERAPY ALERT CARD or IMMUNOTHERAPY ALERT CARD at check-in to  the Emergency Department and triage nurse.  Should you have questions after your visit or need to cancel or reschedule your appointment, please contact [facility]  Dept: [phone]  and follow the prompts.  Office hours are 8:00 a.m. to 4:30 p.m. Monday - Friday. Please note that voicemails left after 4:00 p.m. may not be returned until the following business day.  We are closed weekends and major holidays. You have access to a nurse at all times for urgent questions. Please call the main number to the clinic Dept: [phone] and follow the prompts.   For any non-urgent questions, you may also contact your provider using MyChart. We now offer e-Visits for anyone 18 and older to request care online for non-urgent symptoms. For details visit [email or web address].   Also download the MyChart app! Go to the app store, search "MyChart", open the app, select [facility], and log in with your MyChart username and password.  Masks are optional in the cancer centers. If you would like for your care team to wear a mask while they are taking care of you, please let them know. You may have one support person who is at least 64 years old accompany you for your appointments. 

## 2022-04-19 NOTE — Progress Notes (Signed)
Kristin Ward   [phone] [phone]   Clinic Follow up Note   Patient Care Team: Kristin Lung, MD as PCP - General (Family Medicine) Kristin Man, MD as PCP - Cardiology (Cardiology) [name], [name], MD as PCP - Advanced Heart Failure (Cardiology) Kristin Merle, MD as Consulting Physician (Hematology) Kristin Klein, MD as Consulting Physician (General Surgery) Kristin Ward, [facility] as Pharmacist (Pharmacist) Kristin Pi, MD as Referring Physician (Endocrinology)  Date of Service:  04/19/2022  CHIEF COMPLAINT: f/u of metastatic breast cancer  CURRENT THERAPY:  Enhertu, q3weeks, starting 02/15/22 -Zometa, q76month, starting 11/20/20  ASSESSMENT & PLAN:  PRillie Riffelis a 66y.o. female with   1. Left breast invasive ductal carcinoma, stage IB, p(T1cN0M0), Triple negative, Grade 3, in 2018, metastatic disease in 08/2020 ER 20% weakly + PR/HER2 (0) negative, PD-L1 0%, genetics (-), HER2 1+ on node biopsy  -diagnosed with triple negative breast cancer in 05/2017. She is s/p left breast lumpectomy, adjuvant chemo TC and Radiation.  -Genetic testing was negative for mutations.  -Due to new right gluteal pain, she underwent biopsy on 09/02/20 which showed metastatic carcinoma, consistent with breast primary. ER 20% positive, PR and HER2 negative.  -she received 4 cycles of Taxol 09/26/20 - 12/18/20, discontinued due to worsening neuropathy. She progressed through Xeloda 01/01/21 - 06/2021 and [name]11/22/22 - 01/11/22. TIvette Loyalcaused uncontrolled hyperglycemia.  -biopsy of a right inguinal lymph node on 01/15/22 showed adenocarcinoma, consistent with metastatic breast carcinoma. Prognostic panel confirmed triple negative disease (Her2 1+ low expression).  -she received palliative radiation to the inguinal nodes under Dr. MLisbeth Renshaw6/19/23 - 02/08/22.  -new baseline CT CAP on 02/25/22 showed: mild improvement to right groin adenopathy; new/progressive  upper abdominal adenopathy; increase in retroperitoneal stranding, R>L; stable right iliac bone lesion.  -She started Enhertu on 02/15/22, has significant nausea, low appetite, and fatigue which lasted over a week. She tolerated cycle 2 much better with added IV emend as pre-med and using compazine at home  -She no longer requires diabetic medicine since coming off Trodelvy. -She tolerated last cycle treatment well, lab is pending, will proceed with treatment if labs are adequate. -Plan to repeat a scan after 4 cycles of treatment   2.  Left inguinal pain and tenderness -developed within last few days, reports the pain is mainly in groin but expands to left hip and down her inner thigh. -she is very tender to palpation on exam today, 9/8. -STAT doppler performed showed no DVT and (+) enlarged nodes, pain is probably related to adenopathy    3. Chronic combined systolic and diastolic heart failure -due to concerns for reversible cardiotoxicity with Enhertu, she was evaluated by Dr. BHaroldine Ward Echo on 01/31/22 showed EF 35-40%, GLS -12.9%, RV mildly reduced.  -repeat echo 04/01/22 showed stability to slight improvement in EF to ~40% per Dr. BHaroldine Ward   4. Bone metastases, Vit D deficiency -She had radiating right hip pain since 2020 from right iliac wing mass -She completed Palliative radiation to right iliac 09/04/20 - 09/22/20 with Dr. MLisbeth Ward and her pain is much improved.  -Given localized bone met, she started Zometa q376monthon 11/20/20. Most recent dose 02/15/22 -vit D has remained low, most recently 20.86 on 08/17/21.   5. G2 peripheral Neuropathy  -S/p C2D8 Taxol she developed mild tingling in her hands and feet. Taxol stopped after 12/18/20. Her neuropathy is improving.  -Overall stable     PLAN: -STAT LLE doppler today -proceed  with cycle 4 Enhertu today at same dose if lab adequate  -restaging CT to be done prior to next treatment -lab, flush, f/u, and Enhertu in 3 and 6 weeks   No  problem-specific Assessment & Plan notes found for this encounter.   SUMMARY OF ONCOLOGIC HISTORY: Oncology History Overview Note  Cancer Staging Malignant neoplasm of lower-outer quadrant of left breast of female, estrogen receptor negative ([place]) Staging form: Breast, AJCC 8th Edition - Clinical stage from 06/06/2017: Stage IB (cT1c, cN0, cM0, G3, ER-, PR-, HER2-) - Signed by Kristin Merle, MD on 06/15/2017 Nuclear grade: G3 Histologic grading system: 3 grade system - Pathologic stage from 06/26/2017: Stage IB (pT1c, pN0, cM0, G3, ER-, PR-, HER2-) - Signed by Kristin Merle, MD on 07/10/2017 Neoadjuvant therapy: No Nuclear grade: G3 Multigene prognostic tests performed: None Histologic grading system: 3 grade system Laterality: Left     Malignant neoplasm of lower-outer quadrant of left breast of female, estrogen receptor negative ([place])  05/30/2017 Mammogram   Diagnostic mammo and US IMPRESSION: 1. Highly suspicious 1.4 cm mass in the slightly lower slightly outer left breast -tissue sampling recommended. 2. Indeterminate 0.5 mm mass in the slightly lower slightly outer left breast -tissue sampling recommended. 3. At least 2 left axillary lymph nodes with borderline cortical thickness.   06/06/2017 Initial Biopsy   Diagnosis 1. Breast, left, needle core biopsy, 5:30 o'clock - INVASIVE DUCTAL CARCINOMA, G3 2. Lymph node, needle/core biopsy, left axillary - NO CARCINOMA IDENTIFIED IN ONE LYMPH NODE (0/1)   06/06/2017 Initial Diagnosis   Malignant neoplasm of lower-outer quadrant of left breast of female, estrogen receptor negative ([place])   06/06/2017 Receptors her2   Estrogen Receptor: 0%, NEGATIVE Progesterone Receptor: 0%, NEGATIVE Proliferation Marker Ki67: 70%   06/26/2017 Surgery   LEFT BREAST LUMPECTOMY WITH RADIOACTIVE SEED AND SENTINEL LYMPH NODE BIOPSY ERAS  PATHWAY AND INSERTION PORT-A-CATH By Dr. Barry Ward on 06/26/17    06/26/2017 Pathology Results   Diagnosis 06/26/17   1. Breast, lumpectomy, Left - INVASIVE DUCTAL CARCINOMA, GRADE III/III, SPANNING 1.2 CM. - THE SURGICAL RESECTION MARGINS ARE NEGATIVE FOR CARCINOMA. - SEE ONCOLOGY TABLE BELOW. 2. Lymph node, sentinel, biopsy, Left axillary #1 - THERE IS NO EVIDENCE OF CARCINOMA IN 1 OF 1 LYMPH NODE (0/1). 3. Lymph node, sentinel, biopsy, Left axillary #2 - THERE IS NO EVIDENCE OF CARCINOMA IN 1 OF 1 LYMPH NODE (0/1). 4. Lymph node, sentinel, biopsy, Left axillary #3 - THERE IS NO EVIDENCE OF CARCINOMA IN 1 OF 1 LYMPH NODE (0/1).    07/25/2017 - 09/29/2017 Chemotherapy   Adjuvant cytoxan and docetaxel (TC) every 3 weekd for 4 cycles     11/19/2017 - 12/17/2017 Radiation Therapy   Adjuvant breast radiation Left breast treated to 42.5 Gy with 17 fx of 2.5 Gy followed by a boost of 7.5 Gy with 3 fx of 2.5 Gy      02/2019 Procedure   She had PAC removal in 02/2019.    07/23/2020 Imaging   MRI Lumbar Spine 07/23/20 IMPRESSION: 1. No significant disc herniation, spinal canal or neural foraminal stenosis at any level. 2. Moderate facet degenerative changes at L3-4, L4-5 and L5-S1. 3. Multiple enlarged retroperitoneal and right iliac lymph nodes. Recommend correlation with CT of the abdomen and pelvis with contrast.   08/15/2020 Imaging   CT AP 08/15/20  IMPRESSION: 1. Right iliac and periaortic adenopathy is noted concerning for metastatic disease or lymphoma. Also noted is abnormal soft tissue mass anterior and posterior to the right  iliac wing concerning for malignancy or metastatic disease. MRI is recommended for further evaluation. 2. Irregular lucency with sclerotic margins is seen involving the right iliac crest concerning for possible lytic lesion. MRI may be performed for further evaluation. 3. Enlarged fibroid uterus. 4. Small gallstone. 5. Aortic atherosclerosis.   08/22/2020 Pathology Results   FINAL MICROSCOPIC DIAGNOSIS: 08/22/20  A. NEEDLE CORE, RIGHT GLUTEUS MINIMUMUS, BIOPSY:  -   Metastatic carcinoma  -  See comment   COMMENT:  By immunohistochemistry, the neoplastic cells are positive for  cytokeratin-7 and have weak patchy positivity for GATA3 but are negative for cytokeratin-20 and GCDFP.  The combined morphology and immunophenotype, support metastasis from the patient's known breast primary carcinoma.   ADDENDUM:  PROGNOSTIC INDICATOR RESULTS:  The tumor cells are NEGATIVE for Her2 (0).  Estrogen Receptor:       POSITIVE, 20%, WEAK STAINING  Progesterone Receptor:   NEGATIVE   PD-L1 (0) negative    09/04/2020 - 09/22/2020 Radiation Therapy   Palliative radiation to right iliac 09/04/20 - 09/22/20, Dr. Lisbeth Ward   09/26/2020 -  Chemotherapy   first line weekly Taxol 3 weeks on/1 week off starting 09/26/20 --Reduced to 2 weeks on/1 week off starting with C3 due to neuropathy. Stopped after C4 on 12/18/20 due to neuratrophy.  --Switched to Xeloda on 01/01/21 at 1538m in the AM and 20016min the PM for 2 weeks on/1 week off     10/13/2020 Procedure    PAC placement on 10/13/20.    04/10/2021 Imaging   CT CAP  IMPRESSION: 1. Enlarging abdominopelvic retroperitoneal and right inguinal adenopathy, compatible with disease progression. 2. Lytic metastasis in the right iliac wing, similar. 3. Hepatomegaly. 4. Cholelithiasis. 5. Enlarged fibroid uterus. 6.  Aortic atherosclerosis (ICD10-I70.0).   06/18/2021 Imaging   CT CAP  IMPRESSION: 1. Today's study demonstrates progression of metastatic disease as evidence by increased number and size of numerous enlarged lymph nodes in the retroperitoneum, along the right pelvic sidewall, and in the upper right thigh. 2. Osseous metastases in the bony pelvis appear similar to the prior examination. No definite new osseous lesions are otherwise noted. 3. Fibroid uterus again noted. 4. Cholelithiasis without evidence of acute cholecystitis. 5. Aortic atherosclerosis, in addition to left anterior descending coronary artery disease.  Please note that although the presence of coronary artery calcium documents the presence of coronary artery disease, the severity of this disease and any potential stenosis cannot be assessed on this non-gated CT examination. Assessment for potential risk factor modification, dietary therapy or pharmacologic therapy may be warranted, if clinically indicated. 6. Additional incidental findings, as above.   06/20/2021 Imaging   Bone Scan  IMPRESSION: No osseous uptake suspicious for recurrent/progressive metastatic disease.   07/03/2021 - 01/11/2022 Chemotherapy   Patient is on Treatment Plan : BREAST METASTATIC Sacituzumab govitecan-hziy (TIvette Loyalq21d     09/27/2021 Imaging   EXAM: CT CHEST, ABDOMEN, AND PELVIS WITH CONTRAST  IMPRESSION: 1. Marked interval improvement in previously demonstrated lymphadenopathy in the abdomen and pelvis. A few prominent right inguinal lymph nodes remain. 2. Stable osseous metastatic disease in the pelvis. 3. No evidence of local recurrence or other metastatic disease. 4. Stable incidental findings including cholelithiasis, uterine fibroids and Aortic Atherosclerosis (ICD10-I70.0).  ADDENDUM: Not mentioned in the original impression is a possible nonocclusive fibrin sheath along the distal aspect of the Port-A-Cath.   09/27/2021 Imaging   EXAM: NUCLEAR MEDICINE WHOLE BODY BONE SCAN  IMPRESSION: Stable examination, without abnormal osseous uptake.  11/27/2021 Imaging   CT AP IMPRESSION: 1. Stable lytic and sclerotic lesion in the RIGHT iliac wing. 2. No evidence of visceral metastasis or adenopathy in the abdomenpelvis. 3. Leiomyomatous uterus.  ADDENDUM REPORT: 11/29/2021 09:33 Upon further review and discussion with Cira Rue NP, the RIGHT inguinal lymph nodes previous described have increased in volume. For example 14 mm node (image 91/2) is increased from 9 mm. Adjacent 14 mm node on image 86 is increased from 8 mm. Additionally, LEFT  common iliac lymph node is also slightly increased measuring 10 mm (46/2) compared to 7 mm on comparison exam from 09/27/2021. These sites demonstrates similar but greater lymphadenopathy on CT 06/18/2021.   02/15/2022 - 03/08/2022 Chemotherapy   Patient is on Treatment Plan : BREAST METASTATIC fam-trastuzumab deruxtecan-nxki (Enhertu) q21d     02/15/2022 -  Chemotherapy   Patient is on Treatment Plan : BREAST METASTATIC Fam-Trastuzumab Deruxtecan-nxki (Enhertu) (5.4) q21d        INTERVAL HISTORY:  Kristin Ward is here for a follow up of metastatic breast cancer. She was last seen by me on 03/29/22. She presents to the clinic alone. She reports new left groin pain that started last night (recall she previously had adenopathy in the right groin). She explains the pain is mainly in the left groin and extends from her hip down the inside of her thigh.   All other systems were reviewed with the patient and are negative.  MEDICAL HISTORY:  Past Medical History:  Diagnosis Date   Abnormal x-ray of lungs with single pulmonary nodule 03/15/2016   per records from [place]    Anemia    Asthma    Cholelithiasis 05/19/2017   On CT   Coronary artery calcification seen on CAT scan 05/19/2017   Diabetes mellitus without complication ([place])    type 2   Diabetes mellitus, new onset ([place]) 01/20/2019   Dilated idiopathic cardiomyopathy ([place]) 12/2015   EF 15-20%. Diagnosed in [facility]   Headache    NONE RECENT   Hypertension    Lymph nodes enlarged 07/25/2020   Malignant neoplasm of lower-outer quadrant of left breast of female, estrogen receptor negative ([place]) 06/11/2017   left breast   Mixed hyperlipidemia 06/17/2018   partially blind    Personal history of chemotherapy 2018-2019   Personal history of radiation therapy 2019   Retinitis pigmentosa    Retroperitoneal lymphadenopathy 07/25/2020   Uveitis     SURGICAL HISTORY: Past Surgical History:  Procedure Laterality  Date   brain cyst removed  2007   to help with headaches per patient , aspirated    BREAST BIOPSY Left 06/06/2017   x2   BREAST CYST ASPIRATION Left 06/06/2017   BREAST LUMPECTOMY Left 06/26/2017   BREAST LUMPECTOMY WITH RADIOACTIVE SEED AND SENTINEL LYMPH NODE BIOPSY Left 06/26/2017   Procedure: BREAST LUMPECTOMY WITH RADIOACTIVE SEED AND SENTINEL LYMPH NODE BIOPSY ERAS  PATHWAY;  Surgeon: Kristin Klein, MD;  Location: [place];  Service: General;  Laterality: Left;  pec block   FINE NEEDLE ASPIRATION Left 02/23/2019   Procedure: FINE NEEDLE ASPIRATION;  Surgeon: Kristin Klein, MD;  Location: [place];  Service: General;  Laterality: Left;  Asiration of left breast seroma    IR IMAGING GUIDED PORT INSERTION  10/13/2020   NASAL TURBINATE REDUCTION     PORT-A-CATH REMOVAL Right 02/23/2019   Procedure: REMOVAL PORT-A-CATH;  Surgeon: Kristin Klein, MD;  Location: [place];  Service: General;  Laterality: Right;   PORTACATH  PLACEMENT N/A 06/26/2017   Procedure: INSERTION PORT-A-CATH;  Surgeon: Kristin Klein, MD;  Location: [facility];  Service: General;  Laterality: N/A;   TRANSTHORACIC ECHOCARDIOGRAM  12/2015   A) [facility] May 2017: Mild concentric LVH. Global hypokinesis. GR daily. EF 18% severe LA dilation. Mitral annular dilatation with papillary muscle dysfunction and moderate MR. Dilated IVC consistent with elevated RAP.   TRANSTHORACIC ECHOCARDIOGRAM  05/2017; 08/2017   a) Mild concentric LVH.  EF 45 to 50% with diffuse hypokinesis.  GR 1 DD.  Mild aortic root dilation.;; b)  Mild LVH EF 45 to 50%.  Diffuse HK.  No significant valve disease.  No significant change.   TRANSTHORACIC ECHOCARDIOGRAM  01/30/2022   Pre-Enhertu:EF 35 to 40%.  Moderate dysfunction with global HK.  Abnormal strain.  Normal RV size and function.  Normal RVSP.  Aortic root estimated 42 mm.  Mild MR.  Mild/trivial AI.  Normal RVP.;  04/01/2022: Preliminary read 40%.  Formal read 45  to 50% (probably 40 to 45%).  Global HK.  GR 1 DD.  Normal RV size and RVP.  Mild MR.  Now measured at 38 mm.  Normal RAP.    I have reviewed the social history and family history with the patient and they are unchanged from previous note.  ALLERGIES:  is allergic to morphine and related, latex, tylenol with codeine #3 [acetaminophen-codeine], and penicillins.  MEDICATIONS:  Current Outpatient Medications  Medication Sig Dispense Refill   Accu-Chek FastClix Lancets MISC TEST TWICE A DAY. PT USES AN ACCU-CHEK GUIDE ME METER 102 each 2   amLODipine (NORVASC) 5 MG tablet Take 1 tablet (5 mg total) by mouth daily. 30 tablet 10   Ascorbic Acid (VITAMIN C) 500 MG CAPS as directed Orally     aspirin 81 MG chewable tablet 1 tablet Orally Once a day     atorvastatin (LIPITOR) 20 MG tablet 1 tablet Orally Once a day 30 tablet 10   brimonidine (ALPHAGAN) 0.2 % ophthalmic solution Place 1 drop into both eyes 2 (two) times daily.     carvedilol (COREG) 12.5 MG tablet Take 1.5 tablets (18.75 mg total) by mouth 2 (two) times daily with a meal. 90 tablet 10   cholecalciferol (VITAMIN D3) 25 MCG (1000 UNIT) tablet Take 2,000 Units by mouth daily.     Continuous Blood Gluc Receiver (DEXCOM G7 RECEIVER) DEVI 1 Device by Does not apply route 2 (two) times daily. 1 each 1   Continuous Blood Gluc Sensor (DEXCOM G7 SENSOR) MISC Use as directed, replace every 10 DAYS 3 each 10   furosemide (LASIX) 20 MG tablet TAKE 2 TABLETS (40 MG TOTAL) BY MOUTH AS NEEDED FOR WEIGHT GAIN 3 TO 4 LBS. 60 tablet 10   HUMALOG KWIKPEN 100 UNIT/ML KwikPen Inject 3-10 Units into the skin 3 (three) times daily before meals. Per sliding scale     ibuprofen (ADVIL) 800 MG tablet Take 1 tablet (800 mg total) by mouth every 8 (eight) hours as needed. 30 tablet 2   Lancets Misc. (ACCU-CHEK SOFTCLIX LANCET DEV) KIT 1 Units by Does not apply route 2 (two) times daily. Check FSBS BID - Include strips # 50 with 5 refills, Lancets #50 with 5 refills  DX: Type 2 DM - ICD 10: E11.9 1 kit 0   lidocaine-prilocaine (EMLA) cream Apply 1 application topically as needed. 30 g 0   linaclotide (LINZESS) 72 MCG capsule Take 72 mcg by mouth as needed.     Multiple Vitamins-Minerals (WOMENS MULTI  PO) Take 15 mLs by mouth daily.     ondansetron (ZOFRAN) 8 MG tablet Take 1 tablet (8 mg total) by mouth every 8 (eight) hours as needed for nausea or vomiting (begin on day 3 after chemo). 30 tablet 1   oxyCODONE (OXY IR/ROXICODONE) 5 MG immediate release tablet Take 1 tablet (5 mg total) by mouth every 8 (eight) hours as needed for severe pain. 60 tablet 0   pantoprazole (PROTONIX) 40 MG tablet TAKE 1 TABLET BY MOUTH EVERY DAY 90 tablet 1   prochlorperazine (COMPAZINE) 10 MG tablet Take 1 tablet (10 mg total) by mouth every 6 (six) hours as needed (Nausea or vomiting). 30 tablet 1   promethazine (PHENERGAN) 25 MG tablet Take 1 tablet (25 mg total) by mouth every 6 (six) hours as needed for nausea or vomiting. 30 tablet 0   sacubitril-valsartan (ENTRESTO) 97-103 MG Take 1 tablet by mouth 2 (two) times daily. 60 tablet 10   spironolactone (ALDACTONE) 25 MG tablet Take 1 tablet (25 mg total) by mouth daily. 30 tablet 10   vitamin B-12 (CYANOCOBALAMIN) 1000 MCG tablet Take 1,000 mcg by mouth daily.     zinc gluconate 50 MG tablet Take 50 mg by mouth daily.     Current Facility-Administered Medications  Medication Dose Route Frequency Provider Last Rate Last Admin   insulin glulisine (APIDRA) injection 6 Units  6 Units Subcutaneous Once Williams, Lynne B, PA-C       Facility-Administered Medications Ordered in Other Visits  Medication Dose Route Frequency Provider Last Rate Last Admin   sodium chloride flush (NS) 0.9 % injection 10 mL  10 mL Intracatheter PRN Kristin Merle, MD   10 mL at 04/19/22 1643    PHYSICAL EXAMINATION: ECOG PERFORMANCE STATUS: 2 - Symptomatic, <50% confined to bed  Vitals:   04/19/22 1304  BP: (!) 140/74  Pulse: 88  Resp: 16  Temp:  98.3 F (36.8 C)  SpO2: 97%   Wt Readings from Last 3 Encounters:  04/19/22 217 lb 14.4 oz (98.8 kg)  04/09/22 211 lb 3.2 oz (95.8 kg)  04/01/22 215 lb (97.5 kg)     GENERAL:alert, no distress and comfortable SKIN: skin color, texture, turgor are normal, no rashes or significant lesions EYES: normal, Conjunctiva are pink and non-injected, sclera clear  NECK: supple, thyroid normal size, non-tender, without nodularity LYMPH:  no palpable lymphadenopathy in the cervical, axillary, (+) stable bilateral inguinal adenopathy, (+) tenderness to left side ABDOMEN:abdomen soft, non-tender and normal bowel sounds Musculoskeletal:no cyanosis of digits and no clubbing  NEURO: alert & oriented x 3 with fluent speech, no focal motor/sensory deficits  LABORATORY DATA:  I have reviewed the data as listed    Latest Ref Rng & Units 04/19/2022   11:18 AM 03/31/2022    6:00 PM 03/29/2022    9:40 AM  CBC  WBC 4.0 - 10.5 K/uL 5.1  5.5  4.4   Hemoglobin 12.0 - 15.0 g/dL 10.2  9.7  10.4   Hematocrit 36.0 - 46.0 % 31.4  31.0  32.1   Platelets 150 - 400 K/uL 362  351  376         Latest Ref Rng & Units 04/19/2022   11:18 AM 03/31/2022    6:00 PM 03/29/2022    9:40 AM  CMP  Glucose 70 - 99 mg/dL 183  178  103   BUN 8 - 23 mg/dL _0 Creatinine 0.44 - 1.00 mg/dL 0.73  0.90  0.74   Sodium 135 - 145 mmol/L 140  141  142   Potassium 3.5 - 5.1 mmol/L 3.8  3.3  4.0   Chloride 98 - 111 mmol/L 108  111  112   CO2 22 - 32 mmol/L _0 Calcium 8.9 - 10.3 mg/dL 9.0  9.0  9.1   Total Protein 6.5 - 8.1 g/dL 7.0   7.5   Total Bilirubin 0.3 - 1.2 mg/dL 0.4   0.4   Alkaline Phos 38 - 126 U/L 85   77   AST 15 - 41 U/L 21   24   ALT 0 - 44 U/L 14   13       RADIOGRAPHIC STUDIES: I have personally reviewed the radiological images as listed and agreed with the findings in the report. VAS [place] LOWER EXTREMITY VENOUS (DVT)  Result Date: 04/19/2022  Lower Venous DVT Study Patient Name:  Kristin ANN  Ward  Date of Exam:   04/19/2022 Medical Rec #: [id number]            Accession #:    [id number] Date of Birth: September 10, 1955           Patient Gender: F Patient Age:   66 years Exam Location:  [facility] Procedure:      VAS [place] LOWER EXTREMITY VENOUS (DVT) Referring Phys: Kristin Ward --------------------------------------------------------------------------------  Indications: Groin pain.  Risk Factors: Cancer patient on chemotherapy. Comparison Study: Previous exam of RLE on 02/06/22 was negative for DVT. No                   Previous LLE exams on file. Performing Technologist: Rogelia Rohrer RVT, RDMS  Examination Guidelines: A complete evaluation includes B-mode imaging, spectral Doppler, color Doppler, and power Doppler as needed of all accessible portions of each vessel. Bilateral testing is considered an integral part of a complete examination. Limited examinations for reoccurring indications may be performed as noted. The reflux portion of the exam is performed with the patient in reverse Trendelenburg.  +-----+---------------+---------+-----------+----------+--------------+ RIGHTCompressibilityPhasicitySpontaneityPropertiesThrombus Aging +-----+---------------+---------+-----------+----------+--------------+ CFV  Full           Yes      Yes                                 +-----+---------------+---------+-----------+----------+--------------+   +---------+---------------+---------+-----------+----------+--------------+ LEFT     CompressibilityPhasicitySpontaneityPropertiesThrombus Aging +---------+---------------+---------+-----------+----------+--------------+ CFV      Full           Yes      Yes                                 +---------+---------------+---------+-----------+----------+--------------+ SFJ      Full                                                        +---------+---------------+---------+-----------+----------+--------------+ FV Prox  Full           Yes       Yes                                 +---------+---------------+---------+-----------+----------+--------------+ FV Mid  Full           Yes      Yes                                 +---------+---------------+---------+-----------+----------+--------------+ FV DistalFull           Yes      Yes                                 +---------+---------------+---------+-----------+----------+--------------+ PFV      Full                                                        +---------+---------------+---------+-----------+----------+--------------+ POP      Full           Yes      Yes                                 +---------+---------------+---------+-----------+----------+--------------+ PTV      Full                                                        +---------+---------------+---------+-----------+----------+--------------+ PERO     Full                                                        +---------+---------------+---------+-----------+----------+--------------+     Summary:  RIGHT: - No evidence of common femoral vein obstruction. - Ultrasound characteristics of atypical, enlarged lymph nodes are noted in the groin.  LEFT: - There is no evidence of deep vein thrombosis in the lower extremity.  - No cystic structure found in the popliteal fossa. - Ultrasound characteristics of atypical, enlarged lymph nodes noted in the groin.  *See table(s) above for measurements and observations. Electronically signed by Orlie Pollen on 04/19/2022 at 4:09:01 PM.    Final       No orders of the defined types were placed in this encounter.  All questions were answered. The patient knows to call the clinic with any problems, questions or concerns. No barriers to learning was detected. The total time spent in the appointment was 30 minutes.     Kristin Merle, MD 04/19/2022   I, Wilburn Mylar, am acting as scribe for Kristin Merle, MD.   I have reviewed the above documentation  for accuracy and completeness, and I agree with the above.

## 2022-04-19 NOTE — Progress Notes (Signed)
LLE venous duplex has been completed.  Preliminary results given to Dr. Burr Medico.    Results can be found under chart review under CV PROC. 04/19/2022 2:23 PM Shaheed Schmuck RVT, RDMS

## 2022-04-19 NOTE — Telephone Encounter (Signed)
Dr. Ellyn Hack sent this in today. This is a duplicate

## 2022-04-20 LAB — CANCER ANTIGEN 27.29: CA 27.29: 80.8 U/mL — ABNORMAL HIGH (ref 0.0–38.6)

## 2022-04-29 ENCOUNTER — Encounter: Payer: Self-pay | Admitting: Family Medicine

## 2022-04-29 ENCOUNTER — Ambulatory Visit (INDEPENDENT_AMBULATORY_CARE_PROVIDER_SITE_OTHER): Payer: Medicare Other | Admitting: Family Medicine

## 2022-04-29 VITALS — BP 108/70 | HR 79 | Temp 97.3°F | Wt 209.6 lb

## 2022-04-29 DIAGNOSIS — I700 Atherosclerosis of aorta: Secondary | ICD-10-CM

## 2022-04-29 DIAGNOSIS — Z2821 Immunization not carried out because of patient refusal: Secondary | ICD-10-CM

## 2022-04-29 DIAGNOSIS — I420 Dilated cardiomyopathy: Secondary | ICD-10-CM | POA: Diagnosis not present

## 2022-04-29 DIAGNOSIS — H3552 Pigmentary retinal dystrophy: Secondary | ICD-10-CM

## 2022-04-29 DIAGNOSIS — Z171 Estrogen receptor negative status [ER-]: Secondary | ICD-10-CM

## 2022-04-29 DIAGNOSIS — Z9221 Personal history of antineoplastic chemotherapy: Secondary | ICD-10-CM | POA: Diagnosis not present

## 2022-04-29 DIAGNOSIS — H5440 Blindness, one eye, unspecified eye: Secondary | ICD-10-CM | POA: Diagnosis not present

## 2022-04-29 DIAGNOSIS — I2510 Atherosclerotic heart disease of native coronary artery without angina pectoris: Secondary | ICD-10-CM

## 2022-04-29 DIAGNOSIS — G620 Drug-induced polyneuropathy: Secondary | ICD-10-CM

## 2022-04-29 DIAGNOSIS — C50512 Malignant neoplasm of lower-outer quadrant of left female breast: Secondary | ICD-10-CM

## 2022-04-29 DIAGNOSIS — I10 Essential (primary) hypertension: Secondary | ICD-10-CM

## 2022-04-29 DIAGNOSIS — Z95828 Presence of other vascular implants and grafts: Secondary | ICD-10-CM | POA: Diagnosis not present

## 2022-04-29 DIAGNOSIS — I5042 Chronic combined systolic (congestive) and diastolic (congestive) heart failure: Secondary | ICD-10-CM

## 2022-04-29 NOTE — Progress Notes (Signed)
   Subjective:    Patient ID: Kristin Ward, female    DOB: [birth year], 66 y.o.   MRN: [id number]  HPI She is here for an interval evaluation.  She is currently in the process of being treated for metastatic breast cancer.  She does have a Port-A-Cath in place she is also followed by cardiology because of the potential for the cardiomyopathy from the medication.  She does have diabetes and is followed by Dr. Debbora Presto.  She does have evidence of retinitis pigmentosa and does use a cane.   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  She was not interested in any other immunizations.  She will continue on her present medication regimen and also follow-up with oncology as well as cardiology.

## 2022-04-30 ENCOUNTER — Telehealth: Payer: Self-pay | Admitting: Family Medicine

## 2022-04-30 NOTE — Telephone Encounter (Signed)
Left message for patient to call back and schedule Medicare Annual Wellness Visit (AWV) either virtually or in office. I left my number for patient to call [phone].  Last AWV ;01/12/21  please schedule at anytime with health coach

## 2022-05-09 ENCOUNTER — Other Ambulatory Visit: Payer: Self-pay

## 2022-05-09 DIAGNOSIS — E782 Mixed hyperlipidemia: Secondary | ICD-10-CM

## 2022-05-09 MED FILL — Dexamethasone Sodium Phosphate Inj 100 MG/10ML: INTRAMUSCULAR | Qty: 1 | Status: AC

## 2022-05-09 MED FILL — Fosaprepitant Dimeglumine For IV Infusion 150 MG (Base Eq): INTRAVENOUS | Qty: 5 | Status: AC

## 2022-05-10 ENCOUNTER — Other Ambulatory Visit: Payer: Self-pay | Admitting: Family Medicine

## 2022-05-10 ENCOUNTER — Inpatient Hospital Stay: Payer: Medicare Other

## 2022-05-10 ENCOUNTER — Inpatient Hospital Stay (HOSPITAL_BASED_OUTPATIENT_CLINIC_OR_DEPARTMENT_OTHER): Payer: Medicare Other | Admitting: Hematology

## 2022-05-10 ENCOUNTER — Encounter: Payer: Self-pay | Admitting: Hematology

## 2022-05-10 VITALS — BP 142/90 | HR 71 | Temp 97.9°F | Resp 18 | Ht 67.0 in | Wt 219.1 lb

## 2022-05-10 VITALS — BP 138/86 | HR 83 | Resp 18

## 2022-05-10 DIAGNOSIS — Z7189 Other specified counseling: Secondary | ICD-10-CM | POA: Diagnosis not present

## 2022-05-10 DIAGNOSIS — C50512 Malignant neoplasm of lower-outer quadrant of left female breast: Secondary | ICD-10-CM

## 2022-05-10 DIAGNOSIS — R1032 Left lower quadrant pain: Secondary | ICD-10-CM | POA: Diagnosis not present

## 2022-05-10 DIAGNOSIS — Z170 Estrogen receptor positive status [ER+]: Secondary | ICD-10-CM | POA: Diagnosis not present

## 2022-05-10 DIAGNOSIS — I110 Hypertensive heart disease with heart failure: Secondary | ICD-10-CM | POA: Diagnosis not present

## 2022-05-10 DIAGNOSIS — C7951 Secondary malignant neoplasm of bone: Secondary | ICD-10-CM | POA: Diagnosis not present

## 2022-05-10 DIAGNOSIS — Z95828 Presence of other vascular implants and grafts: Secondary | ICD-10-CM

## 2022-05-10 DIAGNOSIS — Z171 Estrogen receptor negative status [ER-]: Secondary | ICD-10-CM | POA: Diagnosis not present

## 2022-05-10 DIAGNOSIS — E559 Vitamin D deficiency, unspecified: Secondary | ICD-10-CM | POA: Diagnosis not present

## 2022-05-10 DIAGNOSIS — G620 Drug-induced polyneuropathy: Secondary | ICD-10-CM | POA: Diagnosis not present

## 2022-05-10 DIAGNOSIS — Z5112 Encounter for antineoplastic immunotherapy: Secondary | ICD-10-CM | POA: Diagnosis not present

## 2022-05-10 DIAGNOSIS — I5042 Chronic combined systolic (congestive) and diastolic (congestive) heart failure: Secondary | ICD-10-CM | POA: Diagnosis not present

## 2022-05-10 DIAGNOSIS — E782 Mixed hyperlipidemia: Secondary | ICD-10-CM

## 2022-05-10 LAB — CMP (CANCER CENTER ONLY)
ALT: 12 U/L (ref 0–44)
AST: 22 U/L (ref 15–41)
Albumin: 3.7 g/dL (ref 3.5–5.0)
Alkaline Phosphatase: 76 U/L (ref 38–126)
Anion gap: 5 (ref 5–15)
BUN: 13 mg/dL (ref 8–23)
CO2: 26 mmol/L (ref 22–32)
Calcium: 9.1 mg/dL (ref 8.9–10.3)
Chloride: 110 mmol/L (ref 98–111)
Creatinine: 0.66 mg/dL (ref 0.44–1.00)
GFR, Estimated: >60 mL/min (ref >60)
Glucose, Bld: 127 mg/dL — ABNORMAL HIGH (ref 70–99)
Potassium: 4.6 mmol/L (ref 3.5–5.1)
Sodium: 141 mmol/L (ref 135–145)
Total Bilirubin: 0.3 mg/dL (ref 0.3–1.2)
Total Protein: 7.4 g/dL (ref 6.5–8.1)

## 2022-05-10 LAB — CBC WITH DIFFERENTIAL (CANCER CENTER ONLY)
Abs Immature Granulocytes: 0.01 10*3/uL (ref 0.00–0.07)
Basophils Absolute: 0 10*3/uL (ref 0.0–0.1)
Basophils Relative: 0 %
Eosinophils Absolute: 0.3 10*3/uL (ref 0.0–0.5)
Eosinophils Relative: 9 %
HCT: 30.6 % — ABNORMAL LOW (ref 36.0–46.0)
Hemoglobin: 10.1 g/dL — ABNORMAL LOW (ref 12.0–15.0)
Immature Granulocytes: 0 %
Lymphocytes Relative: 17 %
Lymphs Abs: 0.7 10*3/uL (ref 0.7–4.0)
MCH: 30.3 pg (ref 26.0–34.0)
MCHC: 33 g/dL (ref 30.0–36.0)
MCV: 91.9 fL (ref 80.0–100.0)
Monocytes Absolute: 0.5 10*3/uL (ref 0.1–1.0)
Monocytes Relative: 13 %
Neutro Abs: 2.4 10*3/uL (ref 1.7–7.7)
Neutrophils Relative %: 61 %
Platelet Count: 300 10*3/uL (ref 150–400)
RBC: 3.33 MIL/uL — ABNORMAL LOW (ref 3.87–5.11)
RDW: 17.2 % — ABNORMAL HIGH (ref 11.5–15.5)
WBC Count: 4 10*3/uL (ref 4.0–10.5)
nRBC: 0 % (ref 0.0–0.2)

## 2022-05-10 MED ORDER — HEPARIN SOD (PORK) LOCK FLUSH 100 UNIT/ML IV SOLN
500.0000 [IU] | Freq: Once | INTRAVENOUS | Status: AC | PRN
  Administered 2022-05-10: 500 [IU]

## 2022-05-10 MED ORDER — DIPHENHYDRAMINE HCL 25 MG PO CAPS
50.0000 mg | ORAL_CAPSULE | Freq: Once | ORAL | Status: AC
  Administered 2022-05-10: 50 mg via ORAL
  Filled 2022-05-10: qty 2

## 2022-05-10 MED ORDER — DEXTROSE 5 % IV SOLN: Freq: Once | INTRAVENOUS | Status: AC

## 2022-05-10 MED ORDER — SODIUM CHLORIDE 0.9 % IV SOLN
150.0000 mg | Freq: Once | INTRAVENOUS | Status: AC
  Administered 2022-05-10: 150 mg via INTRAVENOUS
  Filled 2022-05-10: qty 150

## 2022-05-10 MED ORDER — SODIUM CHLORIDE 0.9% FLUSH
10.0000 mL | INTRAVENOUS | Status: DC | PRN
  Administered 2022-05-10: 10 mL via INTRAVENOUS

## 2022-05-10 MED ORDER — SODIUM CHLORIDE 0.9 % IV SOLN
10.0000 mg | Freq: Once | INTRAVENOUS | Status: AC
  Administered 2022-05-10: 10 mg via INTRAVENOUS
  Filled 2022-05-10: qty 10

## 2022-05-10 MED ORDER — PALONOSETRON HCL INJECTION 0.25 MG/5ML
0.2500 mg | Freq: Once | INTRAVENOUS | Status: AC
  Administered 2022-05-10: 0.25 mg via INTRAVENOUS
  Filled 2022-05-10: qty 5

## 2022-05-10 MED ORDER — ACETAMINOPHEN 325 MG PO TABS
650.0000 mg | ORAL_TABLET | Freq: Once | ORAL | Status: AC
  Administered 2022-05-10: 650 mg via ORAL
  Filled 2022-05-10: qty 2

## 2022-05-10 MED ORDER — SODIUM CHLORIDE 0.9% FLUSH
10.0000 mL | INTRAVENOUS | Status: DC | PRN
  Administered 2022-05-10: 10 mL

## 2022-05-10 MED ORDER — ZOLEDRONIC ACID 4 MG/100ML IV SOLN
4.0000 mg | Freq: Once | INTRAVENOUS | Status: AC
  Administered 2022-05-10: 4 mg via INTRAVENOUS
  Filled 2022-05-10: qty 100

## 2022-05-10 MED ORDER — FAM-TRASTUZUMAB DERUXTECAN-NXKI CHEMO 100 MG IV SOLR
5.0500 mg/kg | Freq: Once | INTRAVENOUS | Status: AC
  Administered 2022-05-10: 500 mg via INTRAVENOUS
  Filled 2022-05-10: qty 25

## 2022-05-10 NOTE — Patient Instructions (Signed)
[facility]  Discharge Instructions: Thank you for choosing [facility] to provide your oncology and hematology care.   If you have a lab appointment with the Cancer Center, please go directly to the Cancer Center and check in at the registration area.   Wear comfortable clothing and clothing appropriate for easy access to any Portacath or PICC line.   We strive to give you quality time with your provider. You may need to reschedule your appointment if you arrive late (15 or more minutes).  Arriving late affects you and other patients whose appointments are after yours.  Also, if you miss three or more appointments without notifying the office, you may be dismissed from the clinic at the provider's discretion.      For prescription refill requests, have your pharmacy contact our office and allow 72 hours for refills to be completed.    Today you received the following chemotherapy and/or immunotherapy agents: Enhertu      To help prevent nausea and vomiting after your treatment, we encourage you to take your nausea medication as directed.  BELOW ARE SYMPTOMS THAT SHOULD BE REPORTED IMMEDIATELY: *FEVER GREATER THAN 100.4 F (38 C) OR HIGHER *CHILLS OR SWEATING *NAUSEA AND VOMITING THAT IS NOT CONTROLLED WITH YOUR NAUSEA MEDICATION *UNUSUAL SHORTNESS OF BREATH *UNUSUAL BRUISING OR BLEEDING *URINARY PROBLEMS (pain or burning when urinating, or frequent urination) *BOWEL PROBLEMS (unusual diarrhea, constipation, pain near the anus) TENDERNESS IN MOUTH AND THROAT WITH OR WITHOUT PRESENCE OF ULCERS (sore throat, sores in mouth, or a toothache) UNUSUAL RASH, SWELLING OR PAIN  UNUSUAL VAGINAL DISCHARGE OR ITCHING   Items with * indicate a potential emergency and should be followed up as soon as possible or go to the Emergency Department if any problems should occur.  Please show the CHEMOTHERAPY ALERT CARD or IMMUNOTHERAPY ALERT CARD at check-in to  the Emergency Department and triage nurse.  Should you have questions after your visit or need to cancel or reschedule your appointment, please contact [facility]  Dept: [phone]  and follow the prompts.  Office hours are 8:00 a.m. to 4:30 p.m. Monday - Friday. Please note that voicemails left after 4:00 p.m. may not be returned until the following business day.  We are closed weekends and major holidays. You have access to a nurse at all times for urgent questions. Please call the main number to the clinic Dept: [phone] and follow the prompts.   For any non-urgent questions, you may also contact your provider using MyChart. We now offer e-Visits for anyone 18 and older to request care online for non-urgent symptoms. For details visit [email or web address].   Also download the MyChart app! Go to the app store, search "MyChart", open the app, select [facility], and log in with your MyChart username and password.  Masks are optional in the cancer centers. If you would like for your care team to wear a mask while they are taking care of you, please let them know. You may have one support person who is at least 64 years old accompany you for your appointments. 

## 2022-05-10 NOTE — Progress Notes (Signed)
[place]   [phone] [phone]) [phone]   Clinic Follow up Note   Patient Care Team: [name], MD as PCP - General (Family Medicine) [name], MD as PCP - Cardiology (Cardiology) [name], [name], MD as PCP - Advanced Heart Failure (Cardiology) [name], MD as Consulting Physician (Hematology) [name], MD as Consulting Physician (General Surgery) [name], [facility] as Pharmacist (Pharmacist) Jacelyn Pi, MD as Referring Physician (Endocrinology)  Date of Service:  05/10/2022  CHIEF COMPLAINT: f/u of metastatic breast cancer  CURRENT THERAPY:  Enhertu, q3weeks, starting 02/15/22 -Zometa, q56month, starting 11/20/20  ASSESSMENT & PLAN:  PBrandan Glauberis a 66y.o. female with   1. Left breast invasive ductal carcinoma, stage IB, p(T1cN0M0), Triple negative, Grade 3, in 2018, metastatic disease in 08/2020 ER 20% weakly + PR/HER2 (0) negative, PD-L1 0%, genetics (-), HER2 1+ on node biopsy  -diagnosed with triple negative breast cancer in 05/2017. She is s/p left breast lumpectomy, adjuvant chemo TC and Radiation.  -Genetic testing was negative for mutations.  -Due to new right gluteal pain, she underwent biopsy on 09/02/20 which showed metastatic carcinoma, consistent with breast primary. ER 20% positive, PR and HER2 negative.  -she received 4 cycles of Taxol 09/26/20 - 12/18/20, discontinued due to worsening neuropathy. She progressed through Xeloda 01/01/21 - 06/2021 and [name]11/22/22 - 01/11/22. TIvette Loyalcaused uncontrolled hyperglycemia.  -biopsy of a right inguinal lymph node on 01/15/22 showed adenocarcinoma, consistent with metastatic breast carcinoma. Prognostic panel confirmed triple negative disease (Her2 1+ low expression).  -she received palliative radiation to the inguinal nodes under Dr. MLisbeth Renshaw6/19/23 - 02/08/22.  -new baseline CT CAP on 02/25/22 showed: mild improvement to right groin adenopathy; new/progressive  upper abdominal adenopathy; increase in retroperitoneal stranding, R>L; stable right iliac bone lesion.  -She started Enhertu on 02/15/22 and had significant nausea, low appetite, and fatigue which lasted over a week. She tolerated cycle 2 much better with added IV emend as pre-med and using compazine at home  -She no longer requires diabetic medicine since coming off [place] -She tolerated last cycle treatment well. Labs reviewed, overall stable and adequate for treatment. -plan to repeat a scan before she comes back in 3 weeks   2.  Left inguinal pain and tenderness -developed in early 04/2022, reports the pain is mainly in groin but expands to left hip and down her inner thigh. -she is very tender to palpation on exam 04/19/22. STAT doppler showed no DVT and (+) enlarged nodes, pain is probably related to adenopathy    3. Chronic combined systolic and diastolic heart failure -due to concerns for reversible cardiotoxicity with Enhertu, she was evaluated by Dr. BHaroldine Laws Echo on 01/31/22 showed EF 35-40%, GLS -12.9%, RV mildly reduced.  -repeat echo 04/01/22 showed stability to slight improvement in EF to ~40% per Dr. BHaroldine Laws   4. Bone metastases, Vit D deficiency -She had radiating right hip pain since 2020 from right iliac wing mass -She completed Palliative radiation to right iliac 09/04/20 - 09/22/20 with Dr. MLisbeth Renshaw and her pain is much improved.  -Given localized bone met, she started Zometa q337monthon 11/20/20. Most recent dose 02/15/22 -vit D has remained low, most recently 20.86 on 08/17/21.   5. G2 peripheral Neuropathy  -S/p C2D8 Taxol she developed mild tingling in her hands and feet. Taxol stopped after 12/18/20.  -Overall stable now     PLAN: -proceed with cycle 5 Enhertu today at same dose -restaging CT  to be done prior to next treatment -lab, flush, f/u, and Enhertu in 3 and 6 weeks   No problem-specific Assessment & Plan notes found for this encounter.   SUMMARY OF ONCOLOGIC  HISTORY: Oncology History Overview Note  Cancer Staging Malignant neoplasm of lower-outer quadrant of left breast of female, estrogen receptor negative ([place]) Staging form: Breast, AJCC 8th Edition - Clinical stage from 06/06/2017: Stage IB (cT1c, cN0, cM0, G3, ER-, PR-, HER2-) - Signed by [name], MD on 06/15/2017 Nuclear grade: G3 Histologic grading system: 3 grade system - Pathologic stage from 06/26/2017: Stage IB (pT1c, pN0, cM0, G3, ER-, PR-, HER2-) - Signed by [name], MD on 07/10/2017 Neoadjuvant therapy: No Nuclear grade: G3 Multigene prognostic tests performed: None Histologic grading system: 3 grade system Laterality: Left     Malignant neoplasm of lower-outer quadrant of left breast of female, estrogen receptor negative ([place])  05/30/2017 Mammogram   Diagnostic mammo and US IMPRESSION: 1. Highly suspicious 1.4 cm mass in the slightly lower slightly outer left breast -tissue sampling recommended. 2. Indeterminate 0.5 mm mass in the slightly lower slightly outer left breast -tissue sampling recommended. 3. At least 2 left axillary lymph nodes with borderline cortical thickness.   06/06/2017 Initial Biopsy   Diagnosis 1. Breast, left, needle core biopsy, 5:30 o'clock - INVASIVE DUCTAL CARCINOMA, G3 2. Lymph node, needle/core biopsy, left axillary - NO CARCINOMA IDENTIFIED IN ONE LYMPH NODE (0/1)   06/06/2017 Initial Diagnosis   Malignant neoplasm of lower-outer quadrant of left breast of female, estrogen receptor negative ([place])   06/06/2017 Receptors her2   Estrogen Receptor: 0%, NEGATIVE Progesterone Receptor: 0%, NEGATIVE Proliferation Marker Ki67: 70%   06/26/2017 Surgery   LEFT BREAST LUMPECTOMY WITH RADIOACTIVE SEED AND SENTINEL LYMPH NODE BIOPSY ERAS  PATHWAY AND INSERTION PORT-A-CATH By Dr. Barry Dienes on 06/26/17    06/26/2017 Pathology Results   Diagnosis 06/26/17  1. Breast, lumpectomy, Left - INVASIVE DUCTAL CARCINOMA, GRADE III/III, SPANNING 1.2  CM. - THE SURGICAL RESECTION MARGINS ARE NEGATIVE FOR CARCINOMA. - SEE ONCOLOGY TABLE BELOW. 2. Lymph node, sentinel, biopsy, Left axillary #1 - THERE IS NO EVIDENCE OF CARCINOMA IN 1 OF 1 LYMPH NODE (0/1). 3. Lymph node, sentinel, biopsy, Left axillary #2 - THERE IS NO EVIDENCE OF CARCINOMA IN 1 OF 1 LYMPH NODE (0/1). 4. Lymph node, sentinel, biopsy, Left axillary #3 - THERE IS NO EVIDENCE OF CARCINOMA IN 1 OF 1 LYMPH NODE (0/1).    07/25/2017 - 09/29/2017 Chemotherapy   Adjuvant cytoxan and docetaxel (TC) every 3 weekd for 4 cycles     11/19/2017 - 12/17/2017 Radiation Therapy   Adjuvant breast radiation Left breast treated to 42.5 Gy with 17 fx of 2.5 Gy followed by a boost of 7.5 Gy with 3 fx of 2.5 Gy      02/2019 Procedure   She had PAC removal in 02/2019.    07/23/2020 Imaging   MRI Lumbar Spine 07/23/20 IMPRESSION: 1. No significant disc herniation, spinal canal or neural foraminal stenosis at any level. 2. Moderate facet degenerative changes at L3-4, L4-5 and L5-S1. 3. Multiple enlarged retroperitoneal and right iliac lymph nodes. Recommend correlation with CT of the abdomen and pelvis with contrast.   08/15/2020 Imaging   CT AP 08/15/20  IMPRESSION: 1. Right iliac and periaortic adenopathy is noted concerning for metastatic disease or lymphoma. Also noted is abnormal soft tissue mass anterior and posterior to the right iliac wing concerning for malignancy or metastatic disease. MRI is recommended for further evaluation.  2. Irregular lucency with sclerotic margins is seen involving the right iliac crest concerning for possible lytic lesion. MRI may be performed for further evaluation. 3. Enlarged fibroid uterus. 4. Small gallstone. 5. Aortic atherosclerosis.   08/22/2020 Pathology Results   FINAL MICROSCOPIC DIAGNOSIS: 08/22/20  A. NEEDLE CORE, RIGHT GLUTEUS MINIMUMUS, BIOPSY:  -  Metastatic carcinoma  -  See comment   COMMENT:  By immunohistochemistry, the  neoplastic cells are positive for  cytokeratin-7 and have weak patchy positivity for GATA3 but are negative for cytokeratin-20 and GCDFP.  The combined morphology and immunophenotype, support metastasis from the patient's known breast primary carcinoma.   ADDENDUM:  PROGNOSTIC INDICATOR RESULTS:  The tumor cells are NEGATIVE for Her2 (0).  Estrogen Receptor:       POSITIVE, 20%, WEAK STAINING  Progesterone Receptor:   NEGATIVE   PD-L1 (0) negative    09/04/2020 - 09/22/2020 Radiation Therapy   Palliative radiation to right iliac 09/04/20 - 09/22/20, Dr. Lisbeth Renshaw   09/26/2020 -  Chemotherapy   first line weekly Taxol 3 weeks on/1 week off starting 09/26/20 --Reduced to 2 weeks on/1 week off starting with C3 due to neuropathy. Stopped after C4 on 12/18/20 due to neuratrophy.  --Switched to Xeloda on 01/01/21 at 1547m in the AM and 20038min the PM for 2 weeks on/1 week off     10/13/2020 Procedure    PAC placement on 10/13/20.    04/10/2021 Imaging   CT CAP  IMPRESSION: 1. Enlarging abdominopelvic retroperitoneal and right inguinal adenopathy, compatible with disease progression. 2. Lytic metastasis in the right iliac wing, similar. 3. Hepatomegaly. 4. Cholelithiasis. 5. Enlarged fibroid uterus. 6.  Aortic atherosclerosis (ICD10-I70.0).   06/18/2021 Imaging   CT CAP  IMPRESSION: 1. Today's study demonstrates progression of metastatic disease as evidence by increased number and size of numerous enlarged lymph nodes in the retroperitoneum, along the right pelvic sidewall, and in the upper right thigh. 2. Osseous metastases in the bony pelvis appear similar to the prior examination. No definite new osseous lesions are otherwise noted. 3. Fibroid uterus again noted. 4. Cholelithiasis without evidence of acute cholecystitis. 5. Aortic atherosclerosis, in addition to left anterior descending coronary artery disease. Please note that although the presence of coronary artery calcium documents the  presence of coronary artery disease, the severity of this disease and any potential stenosis cannot be assessed on this non-gated CT examination. Assessment for potential risk factor modification, dietary therapy or pharmacologic therapy may be warranted, if clinically indicated. 6. Additional incidental findings, as above.   06/20/2021 Imaging   Bone Scan  IMPRESSION: No osseous uptake suspicious for recurrent/progressive metastatic disease.   07/03/2021 - 01/11/2022 Chemotherapy   Patient is on Treatment Plan : BREAST METASTATIC Sacituzumab govitecan-hziy (TIvette Loyalq21d     09/27/2021 Imaging   EXAM: CT CHEST, ABDOMEN, AND PELVIS WITH CONTRAST  IMPRESSION: 1. Marked interval improvement in previously demonstrated lymphadenopathy in the abdomen and pelvis. A few prominent right inguinal lymph nodes remain. 2. Stable osseous metastatic disease in the pelvis. 3. No evidence of local recurrence or other metastatic disease. 4. Stable incidental findings including cholelithiasis, uterine fibroids and Aortic Atherosclerosis (ICD10-I70.0).  ADDENDUM: Not mentioned in the original impression is a possible nonocclusive fibrin sheath along the distal aspect of the Port-A-Cath.   09/27/2021 Imaging   EXAM: NUCLEAR MEDICINE WHOLE BODY BONE SCAN  IMPRESSION: Stable examination, without abnormal osseous uptake.   11/27/2021 Imaging   CT AP IMPRESSION: 1. Stable lytic and sclerotic lesion in  the RIGHT iliac wing. 2. No evidence of visceral metastasis or adenopathy in the abdomenpelvis. 3. Leiomyomatous uterus.  ADDENDUM REPORT: 11/29/2021 09:33 Upon further review and discussion with Cira Rue NP, the RIGHT inguinal lymph nodes previous described have increased in volume. For example 14 mm node (image 91/2) is increased from 9 mm. Adjacent 14 mm node on image 86 is increased from 8 mm. Additionally, LEFT common iliac lymph node is also slightly increased measuring 10 mm (46/2) compared  to 7 mm on comparison exam from 09/27/2021. These sites demonstrates similar but greater lymphadenopathy on CT 06/18/2021.   02/15/2022 - 03/08/2022 Chemotherapy   Patient is on Treatment Plan : BREAST METASTATIC fam-trastuzumab deruxtecan-nxki (Enhertu) q21d     02/15/2022 -  Chemotherapy   Patient is on Treatment Plan : BREAST METASTATIC Fam-Trastuzumab Deruxtecan-nxki (Enhertu) (5.4) q21d        INTERVAL HISTORY:  Lavella Myren Kolinski is here for a follow up of metastatic breast cancer. She was last seen by me on 04/19/22. She presents to the clinic alone. She reports her left inguinal pain had subsided until she came in today. She explains the pain is not bad enough for her to take pain medicine. Aside from fatigue, she denies any other side effects. She tells me she did 2.5k of a 5k walk.   All other systems were reviewed with the patient and are negative.  MEDICAL HISTORY:  Past Medical History:  Diagnosis Date   Abnormal x-ray of lungs with single pulmonary nodule 03/15/2016   per records from [place]    Anemia    Asthma    Cholelithiasis 05/19/2017   On CT   Coronary artery calcification seen on CAT scan 05/19/2017   Diabetes mellitus without complication ([place])    type 2   Diabetes mellitus, new onset ([place]) 01/20/2019   Dilated idiopathic cardiomyopathy ([place]) 12/2015   EF 15-20%. Diagnosed in [facility]   Headache    NONE RECENT   Hypertension    Lymph nodes enlarged 07/25/2020   Malignant neoplasm of lower-outer quadrant of left breast of female, estrogen receptor negative ([place]) 06/11/2017   left breast   Mixed hyperlipidemia 06/17/2018   partially blind    Personal history of chemotherapy 2018-2019   Personal history of radiation therapy 2019   Retinitis pigmentosa    Retroperitoneal lymphadenopathy 07/25/2020   Uveitis     SURGICAL HISTORY: Past Surgical History:  Procedure Laterality Date   brain cyst removed  2007   to help with headaches per  patient , aspirated    BREAST BIOPSY Left 06/06/2017   x2   BREAST CYST ASPIRATION Left 06/06/2017   BREAST LUMPECTOMY Left 06/26/2017   BREAST LUMPECTOMY WITH RADIOACTIVE SEED AND SENTINEL LYMPH NODE BIOPSY Left 06/26/2017   Procedure: BREAST LUMPECTOMY WITH RADIOACTIVE SEED AND SENTINEL LYMPH NODE BIOPSY ERAS  PATHWAY;  Surgeon: [name], MD;  Location: [place];  Service: General;  Laterality: Left;  pec block   FINE NEEDLE ASPIRATION Left 02/23/2019   Procedure: FINE NEEDLE ASPIRATION;  Surgeon: [name], MD;  Location: [place];  Service: General;  Laterality: Left;  Asiration of left breast seroma    IR IMAGING GUIDED PORT INSERTION  10/13/2020   NASAL TURBINATE REDUCTION     PORT-A-CATH REMOVAL Right 02/23/2019   Procedure: REMOVAL PORT-A-CATH;  Surgeon: [name], MD;  Location: [place];  Service: General;  Laterality: Right;   PORTACATH PLACEMENT N/A 06/26/2017   Procedure: INSERTION PORT-A-CATH;  Surgeon: [name], MD;  Location: [place];  Service: General;  Laterality: N/A;   TRANSTHORACIC ECHOCARDIOGRAM  12/2015   A) [facility] May 2017: Mild concentric LVH. Global hypokinesis. GR daily. EF 18% severe LA dilation. Mitral annular dilatation with papillary muscle dysfunction and moderate MR. Dilated IVC consistent with elevated RAP.   TRANSTHORACIC ECHOCARDIOGRAM  05/2017; 08/2017   a) Mild concentric LVH.  EF 45 to 50% with diffuse hypokinesis.  GR 1 DD.  Mild aortic root dilation.;; b)  Mild LVH EF 45 to 50%.  Diffuse HK.  No significant valve disease.  No significant change.   TRANSTHORACIC ECHOCARDIOGRAM  01/30/2022   Pre-Enhertu:EF 35 to 40%.  Moderate dysfunction with global HK.  Abnormal strain.  Normal RV size and function.  Normal RVSP.  Aortic root estimated 42 mm.  Mild MR.  Mild/trivial AI.  Normal RVP.;  04/01/2022: Preliminary read 40%.  Formal read 45 to 50% (probably 40 to 45%).  Global HK.  GR 1 DD.  Normal RV  size and RVP.  Mild MR.  Now measured at 38 mm.  Normal RAP.    I have reviewed the social history and family history with the patient and they are unchanged from previous note.  ALLERGIES:  is allergic to morphine and related, latex, tylenol with codeine #3 [acetaminophen-codeine], and penicillins.  MEDICATIONS:  Current Outpatient Medications  Medication Sig Dispense Refill   Accu-Chek FastClix Lancets MISC TEST TWICE A DAY. PT USES AN ACCU-CHEK GUIDE ME METER 102 each 2   amLODipine (NORVASC) 5 MG tablet Take 1 tablet (5 mg total) by mouth daily. (Patient not taking: Reported on 04/29/2022) 30 tablet 10   Ascorbic Acid (VITAMIN C) 500 MG CAPS as directed Orally     aspirin 81 MG chewable tablet 1 tablet Orally Once a day     atorvastatin (LIPITOR) 20 MG tablet 1 tablet Orally Once a day 30 tablet 10   brimonidine (ALPHAGAN) 0.2 % ophthalmic solution Place 1 drop into both eyes 2 (two) times daily.     carvedilol (COREG) 12.5 MG tablet Take 1.5 tablets (18.75 mg total) by mouth 2 (two) times daily with a meal. 90 tablet 10   cholecalciferol (VITAMIN D3) 25 MCG (1000 UNIT) tablet Take 2,000 Units by mouth daily.     Continuous Blood Gluc Receiver (DEXCOM G7 RECEIVER) DEVI 1 Device by Does not apply route 2 (two) times daily. (Patient not taking: Reported on 04/29/2022) 1 each 1   Continuous Blood Gluc Sensor (DEXCOM G7 SENSOR) MISC Use as directed, replace every 10 DAYS (Patient not taking: Reported on 04/29/2022) 3 each 10   furosemide (LASIX) 20 MG tablet TAKE 2 TABLETS (40 MG TOTAL) BY MOUTH AS NEEDED FOR WEIGHT GAIN 3 TO 4 LBS. 60 tablet 10   HUMALOG KWIKPEN 100 UNIT/ML KwikPen Inject 3-10 Units into the skin 3 (three) times daily before meals. Per sliding scale     ibuprofen (ADVIL) 800 MG tablet Take 1 tablet (800 mg total) by mouth every 8 (eight) hours as needed. 30 tablet 2   Lancets Misc. (ACCU-CHEK SOFTCLIX LANCET DEV) KIT 1 Units by Does not apply route 2 (two) times daily. Check  FSBS BID - Include strips # 50 with 5 refills, Lancets #50 with 5 refills DX: Type 2 DM - ICD 10: E11.9 1 kit 0   lidocaine-prilocaine (EMLA) cream Apply 1 application topically as needed. (Patient not taking: Reported on 04/29/2022) 30 g 0   linaclotide (LINZESS) 72 MCG capsule  Take 72 mcg by mouth as needed.     Multiple Vitamins-Minerals (WOMENS MULTI PO) Take 15 mLs by mouth daily.     ondansetron (ZOFRAN) 8 MG tablet Take 1 tablet (8 mg total) by mouth every 8 (eight) hours as needed for nausea or vomiting (begin on day 3 after chemo). 30 tablet 1   oxyCODONE (OXY IR/ROXICODONE) 5 MG immediate release tablet Take 1 tablet (5 mg total) by mouth every 8 (eight) hours as needed for severe pain. (Patient not taking: Reported on 04/29/2022) 60 tablet 0   pantoprazole (PROTONIX) 40 MG tablet TAKE 1 TABLET BY MOUTH EVERY DAY 90 tablet 1   prochlorperazine (COMPAZINE) 10 MG tablet Take 1 tablet (10 mg total) by mouth every 6 (six) hours as needed (Nausea or vomiting). 30 tablet 1   promethazine (PHENERGAN) 25 MG tablet Take 1 tablet (25 mg total) by mouth every 6 (six) hours as needed for nausea or vomiting. 30 tablet 0   sacubitril-valsartan (ENTRESTO) 97-103 MG Take 1 tablet by mouth 2 (two) times daily. 60 tablet 10   spironolactone (ALDACTONE) 25 MG tablet Take 1 tablet (25 mg total) by mouth daily. 30 tablet 10   vitamin B-12 (CYANOCOBALAMIN) 1000 MCG tablet Take 1,000 mcg by mouth daily.     zinc gluconate 50 MG tablet Take 50 mg by mouth daily.     Current Facility-Administered Medications  Medication Dose Route Frequency Provider Last Rate Last Admin   insulin glulisine (APIDRA) injection 6 Units  6 Units Subcutaneous Once Williams, Lynne B, PA-C       Facility-Administered Medications Ordered in Other Visits  Medication Dose Route Frequency Provider Last Rate Last Admin   sodium chloride flush (NS) 0.9 % injection 10 mL  10 mL Intracatheter PRN [name], MD   10 mL at 05/10/22 1053     PHYSICAL EXAMINATION: ECOG PERFORMANCE STATUS: 1 - Symptomatic but completely ambulatory  Vitals:   05/10/22 0816  BP: (!) 142/90  Pulse: 71  Resp: 18  Temp: 97.9 F (36.6 C)  SpO2: 98%   Wt Readings from Last 3 Encounters:  05/10/22 219 lb 1.6 oz (99.4 kg)  04/29/22 209 lb 9.6 oz (95.1 kg)  04/19/22 217 lb 14.4 oz (98.8 kg)     GENERAL:alert, no distress and comfortable SKIN: skin color, texture, turgor are normal, no rashes or significant lesions EYES: normal, Conjunctiva are pink and non-injected, sclera clear  LUNGS: clear to auscultation and percussion with normal breathing effort HEART: regular rate & rhythm and no murmurs and no lower extremity edema Musculoskeletal:no cyanosis of digits and no clubbing  NEURO: alert & oriented x 3 with fluent speech, no focal motor/sensory deficits  LABORATORY DATA:  I have reviewed the data as listed    Latest Ref Rng & Units 05/10/2022    7:42 AM 04/19/2022   11:18 AM 03/31/2022    6:00 PM  CBC  WBC 4.0 - 10.5 K/uL 4.0  5.1  5.5   Hemoglobin 12.0 - 15.0 g/dL 10.1  10.2  9.7   Hematocrit 36.0 - 46.0 % 30.6  31.4  31.0   Platelets 150 - 400 K/uL 300  362  351         Latest Ref Rng & Units 05/10/2022    7:42 AM 04/19/2022   11:18 AM 03/31/2022    6:00 PM  CMP  Glucose 70 - 99 mg/dL 127  183  178   BUN 8 - 23 mg/dL 13  10  16   Creatinine 0.44 - 1.00 mg/dL 0.66  0.73  0.90   Sodium 135 - 145 mmol/L 141  140  141   Potassium 3.5 - 5.1 mmol/L 4.6  3.8  3.3   Chloride 98 - 111 mmol/L 110  108  111   CO2 22 - 32 mmol/L '26  28  23   ' Calcium 8.9 - 10.3 mg/dL 9.1  9.0  9.0   Total Protein 6.5 - 8.1 g/dL 7.4  7.0    Total Bilirubin 0.3 - 1.2 mg/dL 0.3  0.4    Alkaline Phos 38 - 126 U/L 76  85    AST 15 - 41 U/L 22  21    ALT 0 - 44 U/L 12  14        RADIOGRAPHIC STUDIES: I have personally reviewed the radiological images as listed and agreed with the findings in the report. No results found.    Orders Placed This  Encounter  Procedures   CT CHEST ABDOMEN PELVIS W CONTRAST    Standing Status:   Future    Standing Expiration Date:   05/11/2023    Order Specific Question:   Preferred imaging location?    Answer:   [facility]    Order Specific Question:   Release to patient    Answer:   Immediate    Order Specific Question:   Is Oral Contrast requested for this exam?    Answer:   Yes, Per Radiology protocol   CBC with Differential ([place] Only)    Standing Status:   Future    Standing Expiration Date:   06/01/2023   CMP ([place] only)    Standing Status:   Future    Standing Expiration Date:   06/01/2023   All questions were answered. The patient knows to call the clinic with any problems, questions or concerns. No barriers to learning was detected. The total time spent in the appointment was 30 minutes.     [name], MD 05/10/2022   I, Wilburn Mylar, am acting as scribe for [name], MD.   I have reviewed the above documentation for accuracy and completeness, and I agree with the above.

## 2022-05-11 LAB — LIPID PANEL
Chol/HDL Ratio: 4.3 ratio (ref 0.0–4.4)
Cholesterol, Total: 165 mg/dL (ref 100–199)
HDL: 38 mg/dL — ABNORMAL LOW (ref >39)
LDL Chol Calc (NIH): 108 mg/dL — ABNORMAL HIGH (ref 0–99)
Triglycerides: 103 mg/dL (ref 0–149)
VLDL Cholesterol Cal: 19 mg/dL (ref 5–40)

## 2022-05-11 MED ORDER — ATORVASTATIN CALCIUM 40 MG PO TABS: 40.0000 mg | ORAL_TABLET | Freq: Every day | ORAL | 3 refills | Status: DC

## 2022-05-11 NOTE — Addendum Note (Signed)
Addended by: [name] on: 05/11/2022 10:40 AM   Modules accepted: Orders

## 2022-05-14 ENCOUNTER — Telehealth: Payer: Self-pay | Admitting: Pharmacist

## 2022-05-14 NOTE — Chronic Care Management (AMB) (Signed)
Chronic Care Management Pharmacy Assistant   Name: Kristin Ward  MRN: [id number] DOB: 1955-10-03  Reason for Encounter: Disease State   Conditions to be addressed/monitored: DMII  Recent office visits:  04/29/22 [name], MD - Patient presented for Malignant neoplasm of lower outer quadrant of left breast of female, estrogen receptor negative and other concerns.No medication changes.   Recent consult visits:  05/10/22 Patient presented to [facility] on 05/10/22 for Infusion. No medication changes.  05/10/22 [name], MD (Oncology) - Patient presented for Malignant neoplasm of lower outer quadrant of left breast of female, estrogen receptor negative and other concerns.No medication changes.  04/19/22 Patient presented to [facility] on 05/10/22 for Infusion. No medication changes.  04/19/22 [name], MD (Oncology) - Patient presented for Malignant neoplasm of lower outer quadrant of left breast of female, estrogen receptor negative and other concerns.No medication changes  04/19/22 Patient presented to [facility] for LE Venous DVT  04/16/22 [name], DPM (Podiatry) - Patient presented for Pain due to onychomycosis of toenails of both feet and other concerns. No medication changes.  04/09/22 [name], MD (Cardiology) - Patient presented for Dilated cardiomyopathy and other concerns. Stopped Oxycodone-Acetaminophen.  04/01/22 [name], [name], MD (Cardiology) - Patient presented for Chronic combined systolic and diastolic heart failure and other concerns & Echocardiogram Stopped Carvedilol.   04/01/22 Balan, Fossil - Patient presented for Atherosclerosis of aorta and other concerns. No medication changes.  03/29/22 Patient presented to [facility] on 05/10/22 for Infusion. No medication changes.  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  Hospital visits:  Medication Reconciliation was completed by comparing discharge summary, patient's EMR and Pharmacy list, and upon discussion with patient.  Patient presented to [facility] ED on 03/31/22 due to Hypokalemia and other concerns. Patient was present for 4 hours.  New?Medications Started at [facility] Discharge:?? -started  none  Medication Changes at Hospital Discharge: -Changed  none  Medications Discontinued at Hospital Discharge: -Stopped  none  Medications that remain the same after Hospital Discharge:??  -All other medications will remain the same.    Medications: Outpatient Encounter Medications as of 05/14/2022  Medication Sig Note   Accu-Chek FastClix Lancets MISC TEST TWICE A DAY. PT USES AN ACCU-CHEK GUIDE ME METER    amLODipine (NORVASC) 5 MG tablet Take 1 tablet (5 mg total) by mouth daily. (Patient not taking: Reported on 04/29/2022)    Ascorbic Acid (VITAMIN C) 500 MG CAPS as directed Orally    aspirin 81 MG chewable tablet 1 tablet Orally Once a day    atorvastatin (LIPITOR) 40 MG tablet Take 1 tablet (40 mg total) by mouth daily.    brimonidine (ALPHAGAN) 0.2 % ophthalmic solution Place 1 drop into both eyes 2 (two) times daily.    carvedilol (COREG) 12.5 MG tablet Take 1.5 tablets (18.75 mg total) by mouth 2 (two) times daily with a meal.    cholecalciferol (VITAMIN D3) 25 MCG (1000 UNIT) tablet Take 2,000 Units by mouth daily.    Continuous Blood Gluc Receiver (DEXCOM G7 RECEIVER) DEVI 1 Device by Does not apply route 2 (two) times daily. (Patient not taking: Reported on 04/29/2022)    Continuous Blood Gluc Sensor (DEXCOM G7 SENSOR) MISC Use as directed, replace every 10  DAYS (Patient not taking: Reported on 04/29/2022)    furosemide (LASIX) 20 MG tablet TAKE 2 TABLETS (40 MG TOTAL) BY MOUTH AS NEEDED  FOR WEIGHT GAIN 3 TO 4 LBS.    HUMALOG KWIKPEN 100 UNIT/ML KwikPen Inject 3-10 Units into the skin 3 (three) times daily before meals. Per sliding scale    ibuprofen (ADVIL) 800 MG tablet Take 1 tablet (800 mg total) by mouth every 8 (eight) hours as needed. 04/29/2022: Prn last dose a few weeks ago   Lancets Misc. (ACCU-CHEK SOFTCLIX LANCET DEV) KIT 1 Units by Does not apply route 2 (two) times daily. Check FSBS BID - Include strips # 50 with 5 refills, Lancets #50 with 5 refills DX: Type 2 DM - ICD 10: E11.9    lidocaine-prilocaine (EMLA) cream Apply 1 application topically as needed. (Patient not taking: Reported on 04/29/2022)    linaclotide (LINZESS) 72 MCG capsule Take 72 mcg by mouth as needed.    Multiple Vitamins-Minerals (WOMENS MULTI PO) Take 15 mLs by mouth daily.    ondansetron (ZOFRAN) 8 MG tablet Take 1 tablet (8 mg total) by mouth every 8 (eight) hours as needed for nausea or vomiting (begin on day 3 after chemo). 04/29/2022: Prn last dose two weeks ago   oxyCODONE (OXY IR/ROXICODONE) 5 MG immediate release tablet Take 1 tablet (5 mg total) by mouth every 8 (eight) hours as needed for severe pain. (Patient not taking: Reported on 04/29/2022) 04/29/2022: Prn last dose four months ago   pantoprazole (PROTONIX) 40 MG tablet TAKE 1 TABLET BY MOUTH EVERY DAY    prochlorperazine (COMPAZINE) 10 MG tablet Take 1 tablet (10 mg total) by mouth every 6 (six) hours as needed (Nausea or vomiting). 04/29/2022: Prn last dose two weeks ago   promethazine (PHENERGAN) 25 MG tablet Take 1 tablet (25 mg total) by mouth every 6 (six) hours as needed for nausea or vomiting. 04/29/2022: Prn last dose two weeks ago   sacubitril-valsartan (ENTRESTO) 97-103 MG Take 1 tablet by mouth 2 (two) times daily.    spironolactone (ALDACTONE) 25 MG tablet Take 1 tablet (25 mg total) by mouth daily.    vitamin B-12 (CYANOCOBALAMIN) 1000 MCG tablet Take 1,000 mcg by mouth daily.    zinc gluconate 50 MG tablet Take 50 mg by mouth  daily.    Facility-Administered Encounter Medications as of 05/14/2022  Medication   insulin glulisine (APIDRA) injection 6 Units  Recent Relevant Labs: Lab Results  Component Value Date/Time   HGBA1C 12.0 (A) 10/12/2021 10:11 AM   HGBA1C 6.8 (H) 07/12/2021 11:42 AM   HGBA1C 8.6 (H) 04/17/2021 10:39 AM    Kidney Function Lab Results  Component Value Date/Time   CREATININE 0.66 05/10/2022 07:42 AM   CREATININE 0.73 04/19/2022 11:18 AM   CREATININE 0.9 08/01/2017 08:02 AM   CREATININE 0.9 07/25/2017 08:09 AM   GFR 80.41 06/23/2020 10:04 AM   GFRNONAA >60 05/10/2022 07:42 AM   GFRNONAA 84 03/20/2017 09:43 AM   GFRAA >60 02/16/2020 09:00 AM   GFRAA >89 03/20/2017 09:43 AM    Current antihyperglycemic regimen:   HUMALOG KWIKPEN 100 UNIT/ML KwikPen    Inject 3-10 Units into the skin 3 (three) times daily before meals. Per sliding scale   Unable to reach for assessment      Care Gaps: Bp- 148/82 03/08/22 AWV 6/22 Lab Results  Component Value Date   HGBA1C 12.0 (A) 10/12/2021    Star Rating Drugs: Atorvastatin 20 mg - Last filled 01/16/22 90 DS  at [facility] Verified Voicemail left  for pt for Gap report      [place] [place] Clinical Pharmacist Assistant [phone]

## 2022-05-22 ENCOUNTER — Other Ambulatory Visit: Payer: Self-pay

## 2022-05-22 ENCOUNTER — Inpatient Hospital Stay: Payer: Medicare Other | Attending: Hematology | Admitting: Licensed Clinical Social Worker

## 2022-05-22 DIAGNOSIS — Z5111 Encounter for antineoplastic chemotherapy: Secondary | ICD-10-CM | POA: Insufficient documentation

## 2022-05-22 DIAGNOSIS — Z171 Estrogen receptor negative status [ER-]: Secondary | ICD-10-CM | POA: Insufficient documentation

## 2022-05-22 DIAGNOSIS — C7951 Secondary malignant neoplasm of bone: Secondary | ICD-10-CM | POA: Insufficient documentation

## 2022-05-22 DIAGNOSIS — I5042 Chronic combined systolic (congestive) and diastolic (congestive) heart failure: Secondary | ICD-10-CM | POA: Insufficient documentation

## 2022-05-22 DIAGNOSIS — Z923 Personal history of irradiation: Secondary | ICD-10-CM | POA: Insufficient documentation

## 2022-05-22 DIAGNOSIS — Z79899 Other long term (current) drug therapy: Secondary | ICD-10-CM | POA: Insufficient documentation

## 2022-05-22 DIAGNOSIS — C7989 Secondary malignant neoplasm of other specified sites: Secondary | ICD-10-CM | POA: Insufficient documentation

## 2022-05-22 DIAGNOSIS — C50512 Malignant neoplasm of lower-outer quadrant of left female breast: Secondary | ICD-10-CM

## 2022-05-22 DIAGNOSIS — Z794 Long term (current) use of insulin: Secondary | ICD-10-CM | POA: Insufficient documentation

## 2022-05-22 DIAGNOSIS — I110 Hypertensive heart disease with heart failure: Secondary | ICD-10-CM | POA: Insufficient documentation

## 2022-05-22 DIAGNOSIS — Z7982 Long term (current) use of aspirin: Secondary | ICD-10-CM | POA: Insufficient documentation

## 2022-05-22 NOTE — Progress Notes (Signed)
Kristin Ward CSW Progress Note  Holiday representative met with patient to organize bills to submit to the [facility] in [place].  Pt states she was approved for $3,500.  Bills collected, totalled and scanned to email.  CSW to follow up w/ pt once a response is received.    Henriette Combs, LCSW

## 2022-05-28 ENCOUNTER — Encounter (HOSPITAL_COMMUNITY): Payer: Self-pay

## 2022-05-28 ENCOUNTER — Ambulatory Visit (HOSPITAL_COMMUNITY)
Admission: RE | Admit: 2022-05-28 | Discharge: 2022-05-28 | Disposition: A | Discharge status: Home / Self Care | Payer: Medicare Other | Source: Ambulatory Visit | Attending: Hematology | Admitting: Hematology

## 2022-05-28 DIAGNOSIS — Z171 Estrogen receptor negative status [ER-]: Secondary | ICD-10-CM | POA: Diagnosis not present

## 2022-05-28 DIAGNOSIS — C50512 Malignant neoplasm of lower-outer quadrant of left female breast: Secondary | ICD-10-CM | POA: Diagnosis not present

## 2022-05-28 DIAGNOSIS — I700 Atherosclerosis of aorta: Secondary | ICD-10-CM | POA: Diagnosis not present

## 2022-05-28 DIAGNOSIS — C7951 Secondary malignant neoplasm of bone: Secondary | ICD-10-CM | POA: Diagnosis not present

## 2022-05-28 DIAGNOSIS — K8020 Calculus of gallbladder without cholecystitis without obstruction: Secondary | ICD-10-CM | POA: Diagnosis not present

## 2022-05-28 DIAGNOSIS — C50912 Malignant neoplasm of unspecified site of left female breast: Secondary | ICD-10-CM | POA: Diagnosis not present

## 2022-05-28 MED ORDER — HEPARIN SOD (PORK) LOCK FLUSH 100 UNIT/ML IV SOLN
INTRAVENOUS | Status: AC
  Administered 2022-05-28: 500 [IU]
  Filled 2022-05-28: qty 5

## 2022-05-28 MED ORDER — SODIUM CHLORIDE (PF) 0.9 % IJ SOLN
INTRAMUSCULAR | Status: AC
  Filled 2022-05-28: qty 50

## 2022-05-28 MED ORDER — IOHEXOL 300 MG/ML  SOLN
100.0000 mL | Freq: Once | INTRAMUSCULAR | Status: AC | PRN
  Administered 2022-05-28: 100 mL via INTRAVENOUS

## 2022-06-03 ENCOUNTER — Encounter: Payer: Self-pay | Admitting: Internal Medicine

## 2022-06-05 ENCOUNTER — Telehealth: Payer: Self-pay | Admitting: Family Medicine

## 2022-06-05 NOTE — Telephone Encounter (Signed)
Left message for patient to call back and schedule Medicare Annual Wellness Visit (AWV) either virtually or in office. Left  my Kristin Ward number [phone]   Last AWV 01/12/21 please schedule with Nurse Health Adviser   45 min for awv-i and in office appointments 30 min for awv-s  phone/virtual appointments

## 2022-06-06 ENCOUNTER — Other Ambulatory Visit: Payer: Self-pay

## 2022-06-06 ENCOUNTER — Other Ambulatory Visit: Payer: Self-pay | Admitting: Cardiology

## 2022-06-06 ENCOUNTER — Other Ambulatory Visit: Payer: Self-pay | Admitting: Hematology

## 2022-06-06 DIAGNOSIS — I10 Essential (primary) hypertension: Secondary | ICD-10-CM

## 2022-06-06 DIAGNOSIS — I5042 Chronic combined systolic (congestive) and diastolic (congestive) heart failure: Secondary | ICD-10-CM

## 2022-06-06 MED FILL — Fosaprepitant Dimeglumine For IV Infusion 150 MG (Base Eq): INTRAVENOUS | Qty: 5 | Status: AC

## 2022-06-06 MED FILL — Dexamethasone Sodium Phosphate Inj 100 MG/10ML: INTRAMUSCULAR | Qty: 1 | Status: AC

## 2022-06-06 NOTE — Progress Notes (Signed)
DISCONTINUE ON PATHWAY REGIMEN - Breast     A cycle is every 21 days:     Fam-trastuzumab deruxtecan-nxki   **Always confirm dose/schedule in your pharmacy ordering system**  REASON: Disease Progression PRIOR TREATMENT: [id number]: Fam-trastuzumab Deruxtecan 5.4 mg/kg q21 Days TREATMENT RESPONSE: Progressive Disease (PD)  START ON PATHWAY REGIMEN - Breast     A cycle is every 21 days:     Eribulin mesylate   **Always confirm dose/schedule in your pharmacy ordering system**  Patient Characteristics: Distant Metastases or Locoregional Recurrent Disease - Unresected or Locally Advanced Unresectable Disease Progressing after Neoadjuvant and Local Therapies, ER Negative, Chemotherapy, HER2 Low, Third Line and Beyond, Eribulin Candidate Therapeutic Status: Distant Metastases HER2 Status: Low ER Status: Negative (-) PR Status: Negative (-) Therapy Approach Indicated: Standard Chemotherapy/Endocrine Therapy Line of Therapy: Third Line and Beyond Intent of Therapy: Non-Curative / Palliative Intent, Discussed with Patient 

## 2022-06-07 ENCOUNTER — Telehealth: Payer: Self-pay

## 2022-06-07 ENCOUNTER — Other Ambulatory Visit: Payer: Self-pay

## 2022-06-07 ENCOUNTER — Inpatient Hospital Stay: Payer: Medicare Other

## 2022-06-07 ENCOUNTER — Inpatient Hospital Stay (HOSPITAL_BASED_OUTPATIENT_CLINIC_OR_DEPARTMENT_OTHER): Payer: Medicare Other | Admitting: Hematology

## 2022-06-07 ENCOUNTER — Encounter: Payer: Self-pay | Admitting: Hematology

## 2022-06-07 VITALS — BP 154/83 | HR 95 | Resp 18

## 2022-06-07 VITALS — BP 160/98 | HR 104 | Temp 98.4°F | Resp 20 | Ht 67.0 in | Wt 215.1 lb

## 2022-06-07 DIAGNOSIS — Z171 Estrogen receptor negative status [ER-]: Secondary | ICD-10-CM

## 2022-06-07 DIAGNOSIS — C50512 Malignant neoplasm of lower-outer quadrant of left female breast: Secondary | ICD-10-CM | POA: Diagnosis not present

## 2022-06-07 DIAGNOSIS — E222 Syndrome of inappropriate secretion of antidiuretic hormone: Secondary | ICD-10-CM

## 2022-06-07 DIAGNOSIS — Z7189 Other specified counseling: Secondary | ICD-10-CM

## 2022-06-07 DIAGNOSIS — Z95828 Presence of other vascular implants and grafts: Secondary | ICD-10-CM

## 2022-06-07 DIAGNOSIS — Z7982 Long term (current) use of aspirin: Secondary | ICD-10-CM | POA: Diagnosis not present

## 2022-06-07 DIAGNOSIS — Z794 Long term (current) use of insulin: Secondary | ICD-10-CM | POA: Diagnosis not present

## 2022-06-07 DIAGNOSIS — I110 Hypertensive heart disease with heart failure: Secondary | ICD-10-CM | POA: Diagnosis not present

## 2022-06-07 DIAGNOSIS — I5042 Chronic combined systolic (congestive) and diastolic (congestive) heart failure: Secondary | ICD-10-CM | POA: Diagnosis not present

## 2022-06-07 DIAGNOSIS — Z79899 Other long term (current) drug therapy: Secondary | ICD-10-CM | POA: Diagnosis not present

## 2022-06-07 DIAGNOSIS — Z5111 Encounter for antineoplastic chemotherapy: Secondary | ICD-10-CM | POA: Diagnosis not present

## 2022-06-07 DIAGNOSIS — C7989 Secondary malignant neoplasm of other specified sites: Secondary | ICD-10-CM | POA: Diagnosis not present

## 2022-06-07 DIAGNOSIS — C7951 Secondary malignant neoplasm of bone: Secondary | ICD-10-CM | POA: Diagnosis not present

## 2022-06-07 DIAGNOSIS — Z923 Personal history of irradiation: Secondary | ICD-10-CM | POA: Diagnosis not present

## 2022-06-07 LAB — CBC WITH DIFFERENTIAL/PLATELET
Abs Immature Granulocytes: 0.04 10*3/uL (ref 0.00–0.07)
Basophils Absolute: 0 10*3/uL (ref 0.0–0.1)
Basophils Relative: 0 %
Eosinophils Absolute: 0.3 10*3/uL (ref 0.0–0.5)
Eosinophils Relative: 4 %
HCT: 32 % — ABNORMAL LOW (ref 36.0–46.0)
Hemoglobin: 10.7 g/dL — ABNORMAL LOW (ref 12.0–15.0)
Immature Granulocytes: 1 %
Lymphocytes Relative: 12 %
Lymphs Abs: 0.8 10*3/uL (ref 0.7–4.0)
MCH: 30.9 pg (ref 26.0–34.0)
MCHC: 33.4 g/dL (ref 30.0–36.0)
MCV: 92.5 fL (ref 80.0–100.0)
Monocytes Absolute: 0.8 10*3/uL (ref 0.1–1.0)
Monocytes Relative: 12 %
Neutro Abs: 4.7 10*3/uL (ref 1.7–7.7)
Neutrophils Relative %: 71 %
Platelets: 342 10*3/uL (ref 150–400)
RBC: 3.46 MIL/uL — ABNORMAL LOW (ref 3.87–5.11)
RDW: 15.7 % — ABNORMAL HIGH (ref 11.5–15.5)
WBC: 6.6 10*3/uL (ref 4.0–10.5)
nRBC: 0 % (ref 0.0–0.2)

## 2022-06-07 LAB — COMPREHENSIVE METABOLIC PANEL
ALT: 22 U/L (ref 0–44)
AST: 31 U/L (ref 15–41)
Albumin: 3.7 g/dL (ref 3.5–5.0)
Alkaline Phosphatase: 117 U/L (ref 38–126)
Anion gap: 5 (ref 5–15)
BUN: 7 mg/dL — ABNORMAL LOW (ref 8–23)
CO2: 28 mmol/L (ref 22–32)
Calcium: 9.4 mg/dL (ref 8.9–10.3)
Chloride: 106 mmol/L (ref 98–111)
Creatinine, Ser: 0.78 mg/dL (ref 0.44–1.00)
GFR, Estimated: >60 mL/min (ref >60)
Glucose, Bld: 89 mg/dL (ref 70–99)
Potassium: 3.9 mmol/L (ref 3.5–5.1)
Sodium: 139 mmol/L (ref 135–145)
Total Bilirubin: 0.5 mg/dL (ref 0.3–1.2)
Total Protein: 7.9 g/dL (ref 6.5–8.1)

## 2022-06-07 LAB — MAGNESIUM: Magnesium: 2.1 mg/dL (ref 1.7–2.4)

## 2022-06-07 MED ORDER — SODIUM CHLORIDE 0.9% FLUSH
10.0000 mL | INTRAVENOUS | Status: DC | PRN
  Administered 2022-06-07: 10 mL via INTRAVENOUS

## 2022-06-07 MED ORDER — PROCHLORPERAZINE MALEATE 10 MG PO TABS
10.0000 mg | ORAL_TABLET | Freq: Once | ORAL | Status: AC
  Administered 2022-06-07: 10 mg via ORAL
  Filled 2022-06-07: qty 1

## 2022-06-07 MED ORDER — SODIUM CHLORIDE 0.9 % IV SOLN: Freq: Once | INTRAVENOUS | Status: AC

## 2022-06-07 MED ORDER — HEPARIN SOD (PORK) LOCK FLUSH 100 UNIT/ML IV SOLN
500.0000 [IU] | Freq: Once | INTRAVENOUS | Status: AC | PRN
  Administered 2022-06-07: 500 [IU]

## 2022-06-07 MED ORDER — SODIUM CHLORIDE 0.9 % IV SOLN: INTRAVENOUS | Status: DC

## 2022-06-07 MED ORDER — SODIUM CHLORIDE 0.9 % IV SOLN
1.4900 mg/m2 | Freq: Once | INTRAVENOUS | Status: AC
  Administered 2022-06-07: 3 mg via INTRAVENOUS
  Filled 2022-06-07: qty 6

## 2022-06-07 MED ORDER — SODIUM CHLORIDE 0.9% FLUSH
10.0000 mL | INTRAVENOUS | Status: DC | PRN
  Administered 2022-06-07: 10 mL

## 2022-06-07 MED ORDER — HYDROCODONE-ACETAMINOPHEN 5-325 MG PO TABS: 1.0000 | ORAL_TABLET | Freq: Four times a day (QID) | ORAL | 0 refills | Status: DC | PRN

## 2022-06-07 NOTE — Telephone Encounter (Signed)
Dr. Burr Medico stated this medication can be purchased over the counter.

## 2022-06-07 NOTE — Progress Notes (Signed)
[place]   [phone]) [phone] [phone]   Clinic Follow up Note   Patient Care Team: [name], MD as PCP - General (Family Medicine) [name], MD as PCP - Cardiology (Cardiology) [name], [name], MD as PCP - Advanced Heart Failure (Cardiology) [name], MD as Consulting Physician (Hematology) [name], MD as Consulting Physician (General Surgery) [name], [facility] as Pharmacist (Pharmacist) Jacelyn Pi, MD as Referring Physician (Endocrinology)  Date of Service:  06/07/2022  CHIEF COMPLAINT: f/u of metastatic breast cancer  CURRENT THERAPY:  Halaven, days 1+8 q21d, starting 06/07/22 -Zometa, q50month, starting 11/20/20  ASSESSMENT & PLAN:  Kristin Noblettis a 66y.o. female with   1. Left breast invasive ductal carcinoma, stage IB, p(T1cN0M0), Triple negative, Grade 3, in 2018, metastatic disease in 08/2020 ER 20% weakly + PR/HER2 (0) negative, PD-L1 0%, genetics (-), HER2 1+ on node biopsy  -diagnosed with triple negative breast cancer in 05/2017. S/p left breast lumpectomy, adjuvant chemo TC and Radiation.  -biopsy of right gluteus on 09/02/20 due to new pain showed metastatic carcinoma, consistent with breast primary. ER 20% weakly positive, PR and HER2 negative.  -s/p 4 cycles of Taxol 09/26/20 - 12/18/20, discontinued due to worsening neuropathy. She progressed through Xeloda 01/01/21 - 06/2021 and [name]11/22/22 - 01/11/22. TIvette Loyalcaused uncontrolled hyperglycemia.  -biopsy of a right inguinal lymph node on 01/15/22 showed adenocarcinoma, consistent with metastatic breast carcinoma. Prognostic panel confirmed triple negative disease (Her2 1+ low expression).  -s/p palliative radiation to right inguinal nodes under Dr. MLisbeth Renshaw6/19/23 - 02/08/22.  -she switched to Enhertu on 02/15/22 due to progression and uncontrolled [place] and she had significant side effects. She tolerated cycle 2 much better  with added IV emend. -restaging CT CAP 05/28/22 showed mixed response-- improvement to some retroperitoneal and right groin nodes, but worsening of other retroperitoneal, left pelvic sidewall, and left groin nodes. Right iliac bone lesion is stable. -I reviewed the results with her today. Given the scan results and her worsening pain, I recommend changing treatment to Halaven. We will start today. I will also make an urgent referral to rad onc for additional treatment. -I also discussed the role of immunotherapy in triple negative disease, I recommend adding on Keytruda.  If her insurance denies, we will try drug replacement. -aside from pain, she is doing well overall. Labs reviewed, overall stable and adequate to start Halaven today.   2.  Left inguinal pain and tenderness -developed in early 04/2022, reports the pain is mainly in groin but expands to left hip and down her inner thigh. -she is very tender to palpation on exam 04/19/22. STAT doppler showed no DVT and (+) enlarged nodes, pain is probably related to adenopathy  -her pain has continued to worsen, rated 9/10 today. She notes the oxycodone she takes is helpful but causes nausea. I will make urgent referral back to Dr. MLisbeth Renshawfor radiation to the enlarging left inguinal nodes.   3. Chronic combined systolic and diastolic heart failure -due to concerns for reversible cardiotoxicity with Enhertu, she was evaluated by Dr. BHaroldine Laws Echo on 01/31/22 showed EF 35-40%, GLS -12.9%, RV mildly reduced.  -repeat echo 04/01/22 showed stability to slight improvement in EF to ~40% per Dr. BHaroldine Laws   4. Bone metastases, Vit D deficiency -She developed radiating right hip pain in 2020 from right iliac wing mass. S/p palliative radiation to right iliac 09/04/20 - 09/22/20 with Dr. MLisbeth Renshaw -Given localized bone  met, she started Zometa q51month on 11/20/20. Most recent dose 05/10/22 -vit D has remained low, most recently 20.86 on 08/17/21.   5. G2 peripheral  Neuropathy  -S/p C2D8 Taxol she developed mild tingling in her hands and feet. Taxol stopped after 12/18/20.  -Overall stable now     PLAN: -proceed with first HT[place] IVF today -urgent referral to rad onc for palliative radiation to left groin area. -lab, flush, and Halaven in 1 and 3, 4 weeks -f/u in 3 weeks   No problem-specific Assessment & Plan notes found for this encounter.   SUMMARY OF ONCOLOGIC HISTORY: Oncology History Overview Note  Cancer Staging Malignant neoplasm of lower-outer quadrant of left breast of female, estrogen receptor negative ([place] Staging form: Breast, AJCC 8th Edition - Clinical stage from 06/06/2017: Stage IB (cT1c, cN0, cM0, G3, ER-, PR-, HER2-) - Signed by FTruitt Merle MD on 06/15/2017 Nuclear grade: G3 Histologic grading system: 3 grade system - Pathologic stage from 06/26/2017: Stage IB (pT1c, pN0, cM0, G3, ER-, PR-, HER2-) - Signed by FTruitt Merle MD on 07/10/2017 Neoadjuvant therapy: No Nuclear grade: G3 Multigene prognostic tests performed: None Histologic grading system: 3 grade system Laterality: Left     Malignant neoplasm of lower-outer quadrant of left breast of female, estrogen receptor negative ([place]  05/30/2017 Mammogram   Diagnostic mammo and [place]IMPRESSION: 1. Highly suspicious 1.4 cm mass in the slightly lower slightly outer left breast -tissue sampling recommended. 2. Indeterminate 0.5 mm mass in the slightly lower slightly outer left breast -tissue sampling recommended. 3. At least 2 left axillary lymph nodes with borderline cortical thickness.   06/06/2017 Initial Biopsy   Diagnosis 1. Breast, left, needle core biopsy, 5:30 o'clock - INVASIVE DUCTAL CARCINOMA, G3 2. Lymph node, needle/core biopsy, left axillary - NO CARCINOMA IDENTIFIED IN ONE LYMPH NODE (0/1)   06/06/2017 Initial Diagnosis   Malignant neoplasm of lower-outer quadrant of left breast of female, estrogen receptor negative ([place]   06/06/2017 Receptors  her2   Estrogen Receptor: 0%, NEGATIVE Progesterone Receptor: 0%, NEGATIVE Proliferation Marker Ki67: 70%   06/26/2017 Surgery   LEFT BREAST LUMPECTOMY WITH RADIOACTIVE SEED AND SENTINEL LYMPH NODE BIOPSY ERAS  PATHWAY AND INSERTION PORT-A-CATH By Dr. BBarry Dieneson 06/26/17    06/26/2017 Pathology Results   Diagnosis 06/26/17  1. Breast, lumpectomy, Left - INVASIVE DUCTAL CARCINOMA, GRADE III/III, SPANNING 1.2 CM. - THE SURGICAL RESECTION MARGINS ARE NEGATIVE FOR CARCINOMA. - SEE ONCOLOGY TABLE BELOW. 2. Lymph node, sentinel, biopsy, Left axillary #1 - THERE IS NO EVIDENCE OF CARCINOMA IN 1 OF 1 LYMPH NODE (0/1). 3. Lymph node, sentinel, biopsy, Left axillary #2 - THERE IS NO EVIDENCE OF CARCINOMA IN 1 OF 1 LYMPH NODE (0/1). 4. Lymph node, sentinel, biopsy, Left axillary #3 - THERE IS NO EVIDENCE OF CARCINOMA IN 1 OF 1 LYMPH NODE (0/1).    07/25/2017 - 09/29/2017 Chemotherapy   Adjuvant cytoxan and docetaxel (TC) every 3 weekd for 4 cycles     11/19/2017 - 12/17/2017 Radiation Therapy   Adjuvant breast radiation Left breast treated to 42.5 Gy with 17 fx of 2.5 Gy followed by a boost of 7.5 Gy with 3 fx of 2.5 Gy      02/2019 Procedure   She had PAC removal in 02/2019.    07/23/2020 Imaging   MRI Lumbar Spine 07/23/20 IMPRESSION: 1. No significant disc herniation, spinal canal or neural foraminal stenosis at any level. 2. Moderate facet degenerative changes at L3-4, L4-5 and L5-S1. 3. Multiple enlarged  retroperitoneal and right iliac lymph nodes. Recommend correlation with CT of the abdomen and pelvis with contrast.   08/15/2020 Imaging   CT AP 08/15/20  IMPRESSION: 1. Right iliac and periaortic adenopathy is noted concerning for metastatic disease or lymphoma. Also noted is abnormal soft tissue mass anterior and posterior to the right iliac wing concerning for malignancy or metastatic disease. MRI is recommended for further evaluation. 2. Irregular lucency with sclerotic  margins is seen involving the right iliac crest concerning for possible lytic lesion. MRI may be performed for further evaluation. 3. Enlarged fibroid uterus. 4. Small gallstone. 5. Aortic atherosclerosis.   08/22/2020 Pathology Results   FINAL MICROSCOPIC DIAGNOSIS: 08/22/20  A. NEEDLE CORE, RIGHT GLUTEUS MINIMUMUS, BIOPSY:  -  Metastatic carcinoma  -  See comment   COMMENT:  By immunohistochemistry, the neoplastic cells are positive for  cytokeratin-7 and have weak patchy positivity for GATA3 but are negative for cytokeratin-20 and GCDFP.  The combined morphology and immunophenotype, support metastasis from the patient's known breast primary carcinoma.   ADDENDUM:  PROGNOSTIC INDICATOR RESULTS:  The tumor cells are NEGATIVE for Her2 (0).  Estrogen Receptor:       POSITIVE, 20%, WEAK STAINING  Progesterone Receptor:   NEGATIVE   PD-L1 (0) negative    09/04/2020 - 09/22/2020 Radiation Therapy   Palliative radiation to right iliac 09/04/20 - 09/22/20, Dr. Lisbeth Renshaw   09/26/2020 -  Chemotherapy   first line weekly Taxol 3 weeks on/1 week off starting 09/26/20 --Reduced to 2 weeks on/1 week off starting with C3 due to neuropathy. Stopped after C4 on 12/18/20 due to neuratrophy.  --Switched to Xeloda on 01/01/21 at 1512m in the AM and 2004min the PM for 2 weeks on/1 week off     10/13/2020 Procedure    PAC placement on 10/13/20.    04/10/2021 Imaging   CT CAP  IMPRESSION: 1. Enlarging abdominopelvic retroperitoneal and right inguinal adenopathy, compatible with disease progression. 2. Lytic metastasis in the right iliac wing, similar. 3. Hepatomegaly. 4. Cholelithiasis. 5. Enlarged fibroid uterus. 6.  Aortic atherosclerosis (ICD10-I70.0).   06/18/2021 Imaging   CT CAP  IMPRESSION: 1. Today's study demonstrates progression of metastatic disease as evidence by increased number and size of numerous enlarged lymph nodes in the retroperitoneum, along the right pelvic sidewall, and in the  upper right thigh. 2. Osseous metastases in the bony pelvis appear similar to the prior examination. No definite new osseous lesions are otherwise noted. 3. Fibroid uterus again noted. 4. Cholelithiasis without evidence of acute cholecystitis. 5. Aortic atherosclerosis, in addition to left anterior descending coronary artery disease. Please note that although the presence of coronary artery calcium documents the presence of coronary artery disease, the severity of this disease and any potential stenosis cannot be assessed on this non-gated CT examination. Assessment for potential risk factor modification, dietary therapy or pharmacologic therapy may be warranted, if clinically indicated. 6. Additional incidental findings, as above.   06/20/2021 Imaging   Bone Scan  IMPRESSION: No osseous uptake suspicious for recurrent/progressive metastatic disease.   07/03/2021 - 01/11/2022 Chemotherapy   Patient is on Treatment Plan : BREAST METASTATIC Sacituzumab govitecan-hziy (TIvette Loyalq21d     09/27/2021 Imaging   EXAM: CT CHEST, ABDOMEN, AND PELVIS WITH CONTRAST  IMPRESSION: 1. Marked interval improvement in previously demonstrated lymphadenopathy in the abdomen and pelvis. A few prominent right inguinal lymph nodes remain. 2. Stable osseous metastatic disease in the pelvis. 3. No evidence of local recurrence or other metastatic disease. 4.  Stable incidental findings including cholelithiasis, uterine fibroids and Aortic Atherosclerosis (ICD10-I70.0).  ADDENDUM: Not mentioned in the original impression is a possible nonocclusive fibrin sheath along the distal aspect of the Port-A-Cath.   09/27/2021 Imaging   EXAM: NUCLEAR MEDICINE WHOLE BODY BONE SCAN  IMPRESSION: Stable examination, without abnormal osseous uptake.   11/27/2021 Imaging   CT AP IMPRESSION: 1. Stable lytic and sclerotic lesion in the RIGHT iliac wing. 2. No evidence of visceral metastasis or adenopathy in the  abdomenpelvis. 3. Leiomyomatous uterus.  ADDENDUM REPORT: 11/29/2021 09:33 Upon further review and discussion with Cira Rue NP, the RIGHT inguinal lymph nodes previous described have increased in volume. For example 14 mm node (image 91/2) is increased from 9 mm. Adjacent 14 mm node on image 86 is increased from 8 mm. Additionally, LEFT common iliac lymph node is also slightly increased measuring 10 mm (46/2) compared to 7 mm on comparison exam from 09/27/2021. These sites demonstrates similar but greater lymphadenopathy on CT 06/18/2021.   02/15/2022 - 03/08/2022 Chemotherapy   Patient is on Treatment Plan : BREAST METASTATIC fam-trastuzumab deruxtecan-nxki (Enhertu) q21d     02/15/2022 - 05/10/2022 Chemotherapy   Patient is on Treatment Plan : BREAST METASTATIC Fam-Trastuzumab Deruxtecan-nxki (Enhertu) (5.4) q21d     06/07/2022 -  Chemotherapy   Patient is on Treatment Plan : BREAST METASTATIC Eribulin D1,8 q21d     06/13/2022 -  Chemotherapy   Patient is on Treatment Plan : HEAD/NECK Pembrolizumab (200) q21d        INTERVAL HISTORY:  Kristin Ward is here for a follow up of metastatic breast cancer. She was last seen by me on 05/10/22. She presents to the clinic alone. She is laying on the table due to intense pain in her left hip/groin area, rated 9/10. She explains oxycodone works but makes her sick. She also reports nausea and vomiting recently and feels dehydrated from this.   All other systems were reviewed with the patient and are negative.  MEDICAL HISTORY:  Past Medical History:  Diagnosis Date   Abnormal x-ray of lungs with single pulmonary nodule 03/15/2016   per records from [place]    Anemia    Asthma    Cholelithiasis 05/19/2017   On CT   Coronary artery calcification seen on CAT scan 05/19/2017   Diabetes mellitus without complication ([place])    type 2   Diabetes mellitus, new onset ([place]) 01/20/2019   Dilated idiopathic cardiomyopathy ([place]) 12/2015   EF  15-20%. Diagnosed in [facility]   Headache    NONE RECENT   Hypertension    Lymph nodes enlarged 07/25/2020   Malignant neoplasm of lower-outer quadrant of left breast of female, estrogen receptor negative ([place]) 06/11/2017   left breast   Mixed hyperlipidemia 06/17/2018   partially blind    Personal history of chemotherapy 2018-2019   Personal history of radiation therapy 2019   Retinitis pigmentosa    Retroperitoneal lymphadenopathy 07/25/2020   Uveitis     SURGICAL HISTORY: Past Surgical History:  Procedure Laterality Date   brain cyst removed  2007   to help with headaches per patient , aspirated    BREAST BIOPSY Left 06/06/2017   x2   BREAST CYST ASPIRATION Left 06/06/2017   BREAST LUMPECTOMY Left 06/26/2017   BREAST LUMPECTOMY WITH RADIOACTIVE SEED AND SENTINEL LYMPH NODE BIOPSY Left 06/26/2017   Procedure: BREAST LUMPECTOMY WITH RADIOACTIVE SEED AND SENTINEL LYMPH NODE BIOPSY ERAS  PATHWAY;  Surgeon: [name], MD;  Location: [place];  Service: General;  Laterality: Left;  pec block   FINE NEEDLE ASPIRATION Left 02/23/2019   Procedure: FINE NEEDLE ASPIRATION;  Surgeon: [name], MD;  Location: [place];  Service: General;  Laterality: Left;  Asiration of left breast seroma    IR IMAGING GUIDED PORT INSERTION  10/13/2020   NASAL TURBINATE REDUCTION     PORT-A-CATH REMOVAL Right 02/23/2019   Procedure: REMOVAL PORT-A-CATH;  Surgeon: [name], MD;  Location: [place];  Service: General;  Laterality: Right;   PORTACATH PLACEMENT N/A 06/26/2017   Procedure: INSERTION PORT-A-CATH;  Surgeon: [name], MD;  Location: [place];  Service: General;  Laterality: N/A;   TRANSTHORACIC ECHOCARDIOGRAM  12/2015   A) [facility] May 2017: Mild concentric LVH. Global hypokinesis. GR daily. EF 18% severe LA dilation. Mitral annular dilatation with papillary muscle dysfunction and moderate MR. Dilated IVC consistent with  elevated RAP.   TRANSTHORACIC ECHOCARDIOGRAM  05/2017; 08/2017   a) Mild concentric LVH.  EF 45 to 50% with diffuse hypokinesis.  GR 1 DD.  Mild aortic root dilation.;; b)  Mild LVH EF 45 to 50%.  Diffuse HK.  No significant valve disease.  No significant change.   TRANSTHORACIC ECHOCARDIOGRAM  01/30/2022   Pre-Enhertu:EF 35 to 40%.  Moderate dysfunction with global HK.  Abnormal strain.  Normal RV size and function.  Normal RVSP.  Aortic root estimated 42 mm.  Mild MR.  Mild/trivial AI.  Normal RVP.;  04/01/2022: Preliminary read 40%.  Formal read 45 to 50% (probably 40 to 45%).  Global HK.  GR 1 DD.  Normal RV size and RVP.  Mild MR.  Now measured at 38 mm.  Normal RAP.    I have reviewed the social history and family history with the patient and they are unchanged from previous note.  ALLERGIES:  is allergic to morphine and related, latex, tylenol with codeine #3 [acetaminophen-codeine], and penicillins.  MEDICATIONS:  Current Outpatient Medications  Medication Sig Dispense Refill   HYDROcodone-acetaminophen (NORCO/VICODIN) 5-325 MG tablet Take 1 tablet by mouth every 6 (six) hours as needed for moderate pain. 30 tablet 0   Accu-Chek FastClix Lancets MISC TEST TWICE A DAY. PT USES AN ACCU-CHEK GUIDE ME METER 102 each 2   amLODipine (NORVASC) 5 MG tablet Take 1 tablet (5 mg total) by mouth daily. (Patient not taking: Reported on 04/29/2022) 30 tablet 10   Ascorbic Acid (VITAMIN C) 500 MG CAPS as directed Orally     aspirin 81 MG chewable tablet 1 tablet Orally Once a day     atorvastatin (LIPITOR) 40 MG tablet Take 1 tablet (40 mg total) by mouth daily. 90 tablet 3   brimonidine (ALPHAGAN) 0.2 % ophthalmic solution Place 1 drop into both eyes 2 (two) times daily.     carvedilol (COREG) 12.5 MG tablet TAKE 1 AND 1/2 TABLETS BY MOUTH TWICE DAILY qm 90 tablet 10   cholecalciferol (VITAMIN D3) 25 MCG (1000 UNIT) tablet Take 2,000 Units by mouth daily.     Continuous Blood Gluc Receiver (DEXCOM G7  RECEIVER) DEVI 1 Device by Does not apply route 2 (two) times daily. (Patient not taking: Reported on 04/29/2022) 1 each 1   Continuous Blood Gluc Sensor (DEXCOM G7 SENSOR) MISC Use as directed, replace every 10 DAYS (Patient not taking: Reported on 04/29/2022) 3 each 10   furosemide (LASIX) 20 MG tablet TAKE 2 TABLETS (40 MG TOTAL) BY MOUTH AS NEEDED FOR WEIGHT GAIN 3 TO 4 LBS. 60 tablet  10   HUMALOG KWIKPEN 100 UNIT/ML KwikPen Inject 3-10 Units into the skin 3 (three) times daily before meals. Per sliding scale     ibuprofen (ADVIL) 800 MG tablet Take 1 tablet (800 mg total) by mouth every 8 (eight) hours as needed. 30 tablet 2   Lancets Misc. (ACCU-CHEK SOFTCLIX LANCET DEV) KIT 1 Units by Does not apply route 2 (two) times daily. Check FSBS BID - Include strips # 50 with 5 refills, Lancets #50 with 5 refills DX: Type 2 DM - ICD 10: E11.9 1 kit 0   lidocaine-prilocaine (EMLA) cream Apply 1 application topically as needed. (Patient not taking: Reported on 04/29/2022) 30 g 0   linaclotide (LINZESS) 72 MCG capsule Take 72 mcg by mouth as needed.     Multiple Vitamins-Minerals (WOMENS MULTI PO) Take 15 mLs by mouth daily.     ondansetron (ZOFRAN) 8 MG tablet Take 1 tablet (8 mg total) by mouth every 8 (eight) hours as needed for nausea or vomiting (begin on day 3 after chemo). 30 tablet 1   oxyCODONE (OXY IR/ROXICODONE) 5 MG immediate release tablet Take 1 tablet (5 mg total) by mouth every 8 (eight) hours as needed for severe pain. (Patient not taking: Reported on 04/29/2022) 60 tablet 0   pantoprazole (PROTONIX) 40 MG tablet TAKE 1 TABLET BY MOUTH EVERY DAY 90 tablet 1   prochlorperazine (COMPAZINE) 10 MG tablet Take 1 tablet (10 mg total) by mouth every 6 (six) hours as needed (Nausea or vomiting). 30 tablet 1   promethazine (PHENERGAN) 25 MG tablet Take 1 tablet (25 mg total) by mouth every 6 (six) hours as needed for nausea or vomiting. 30 tablet 0   sacubitril-valsartan (ENTRESTO) 97-103 MG Take 1  tablet by mouth 2 (two) times daily. 60 tablet 10   spironolactone (ALDACTONE) 25 MG tablet Take 1 tablet (25 mg total) by mouth daily. 30 tablet 10   vitamin B-12 (CYANOCOBALAMIN) 1000 MCG tablet Take 1,000 mcg by mouth daily.     zinc gluconate 50 MG tablet Take 50 mg by mouth daily.     Current Facility-Administered Medications  Medication Dose Route Frequency Provider Last Rate Last Admin   insulin glulisine (APIDRA) injection 6 Units  6 Units Subcutaneous Once Williams, Lynne B, PA-C       Facility-Administered Medications Ordered in Other Visits  Medication Dose Route Frequency Provider Last Rate Last Admin   0.9 %  sodium chloride infusion   Intravenous Continuous [name], MD   Stopped at 06/07/22 1213   sodium chloride flush (NS) 0.9 % injection 10 mL  10 mL Intracatheter PRN [name], MD   10 mL at 06/07/22 1221    PHYSICAL EXAMINATION: ECOG PERFORMANCE STATUS: 1 - Symptomatic but completely ambulatory  Vitals:   06/07/22 0944  BP: (!) 160/98  Pulse: (!) 104  Resp: 20  Temp: 98.4 F (36.9 C)  SpO2: 98%   Wt Readings from Last 3 Encounters:  06/07/22 215 lb 1.6 oz (97.6 kg)  05/10/22 219 lb 1.6 oz (99.4 kg)  04/29/22 209 lb 9.6 oz (95.1 kg)     GENERAL:alert, no distress and comfortable SKIN: skin color, texture, turgor are normal, no rashes or significant lesions EYES: normal, Conjunctiva are pink and non-injected, sclera clear  LYMPH: (+) palpable inguinal nodes on both side, more on the left Musculoskeletal:no cyanosis of digits and no clubbing  NEURO: alert & oriented x 3 with fluent speech, no focal motor/sensory deficits  LABORATORY DATA:  I  have reviewed the data as listed    Latest Ref Rng & Units 06/07/2022    9:11 AM 05/10/2022    7:42 AM 04/19/2022   11:18 AM  CBC  WBC 4.0 - 10.5 K/uL 6.6  4.0  5.1   Hemoglobin 12.0 - 15.0 g/dL 10.7  10.1  10.2   Hematocrit 36.0 - 46.0 % 32.0  30.6  31.4   Platelets 150 - 400 K/uL 342  300  362         Latest  Ref Rng & Units 06/07/2022    9:11 AM 05/10/2022    7:42 AM 04/19/2022   11:18 AM  CMP  Glucose 70 - 99 mg/dL 89  127  183   BUN 8 - 23 mg/dL _0 Creatinine 0.44 - 1.00 mg/dL 0.78  0.66  0.73   Sodium 135 - 145 mmol/L 139  141  140   Potassium 3.5 - 5.1 mmol/L 3.9  4.6  3.8   Chloride 98 - 111 mmol/L 106  110  108   CO2 22 - 32 mmol/L _1 Calcium 8.9 - 10.3 mg/dL 9.4  9.1  9.0   Total Protein 6.5 - 8.1 g/dL 7.9  7.4  7.0   Total Bilirubin 0.3 - 1.2 mg/dL 0.5  0.3  0.4   Alkaline Phos 38 - 126 U/L 117  76  85   AST 15 - 41 U/L _2 ALT 0 - 44 U/L _3 RADIOGRAPHIC STUDIES: I have personally reviewed the radiological images as listed and agreed with the findings in the report. No results found.    Orders Placed This Encounter  Procedures   CBC with Differential ([place] Only)    Standing Status:   Future    Standing Expiration Date:   06/08/2023   CMP ([place] only)    Standing Status:   Future    Standing Expiration Date:   06/08/2023   Magnesium    Standing Status:   Future    Standing Expiration Date:   06/08/2023   CBC with Differential ([facility] Only)    Standing Status:   Future    Standing Expiration Date:   06/15/2023   CMP ([place] only)    Standing Status:   Future    Standing Expiration Date:   06/15/2023   Magnesium    Standing Status:   Future    Standing Expiration Date:   06/15/2023   CBC with Differential ([facility] Only)    Standing Status:   Future    Standing Expiration Date:   06/29/2023   CMP ([place] only)    Standing Status:   Future    Standing Expiration Date:   06/29/2023   Magnesium    Standing Status:   Future    Standing Expiration Date:   06/29/2023   CBC with Differential ([place] Only)    Standing Status:   Future    Standing Expiration Date:   07/06/2023   CMP ([place] only)    Standing Status:   Future    Standing Expiration Date:   07/06/2023    Magnesium    Standing Status:   Future    Standing Expiration Date:   07/06/2023   All questions were answered. The patient knows to call the clinic with any problems, questions or concerns.  No barriers to learning was detected. The total time spent in the appointment was 40 minutes.     [name], MD 06/07/2022   I, Wilburn Mylar, am acting as scribe for [name], MD.   I have reviewed the above documentation for accuracy and completeness, and I agree with the above.

## 2022-06-07 NOTE — Telephone Encounter (Signed)
LVM stating that Dr. Burr Medico received a refill request from the pt's pharmacy for IBU '800mg'$ .   Dr. Burr Medico stated this can be purchased over the counter; therefore, will not refill the IBU request.  Instructed pt to contact Dr. Ernestina Penna office should she have additional questions or concerns.

## 2022-06-07 NOTE — Patient Instructions (Signed)
[facility]  Discharge Instructions: Thank you for choosing [facility] to provide your oncology and hematology care.   If you have a lab appointment with the [facility], please go directly to the [facility] and check in at the registration area.   Wear comfortable clothing and clothing appropriate for easy access to any Portacath or PICC line.   We strive to give you quality time with your provider. You may need to reschedule your appointment if you arrive late (15 or more minutes).  Arriving late affects you and other patients whose appointments are after yours.  Also, if you miss three or more appointments without notifying the office, you may be dismissed from the clinic at the provider's discretion.      For prescription refill requests, have your pharmacy contact our office and allow 72 hours for refills to be completed.    Today you received the following chemotherapy and/or immunotherapy agents: Halaven.       To help prevent nausea and vomiting after your treatment, we encourage you to take your nausea medication as directed.  BELOW ARE SYMPTOMS THAT SHOULD BE REPORTED IMMEDIATELY: *FEVER GREATER THAN 100.4 F (38 C) OR HIGHER *CHILLS OR SWEATING *NAUSEA AND VOMITING THAT IS NOT CONTROLLED WITH YOUR NAUSEA MEDICATION *UNUSUAL SHORTNESS OF BREATH *UNUSUAL BRUISING OR BLEEDING *URINARY PROBLEMS (pain or burning when urinating, or frequent urination) *BOWEL PROBLEMS (unusual diarrhea, constipation, pain near the anus) TENDERNESS IN MOUTH AND THROAT WITH OR WITHOUT PRESENCE OF ULCERS (sore throat, sores in mouth, or a toothache) UNUSUAL RASH, SWELLING OR PAIN  UNUSUAL VAGINAL DISCHARGE OR ITCHING   Items with * indicate a potential emergency and should be followed up as soon as possible or go to the Emergency Department if any problems should occur.  Please show the CHEMOTHERAPY ALERT CARD or IMMUNOTHERAPY ALERT CARD at check-in to  the Emergency Department and triage nurse.  Should you have questions after your visit or need to cancel or reschedule your appointment, please contact [facility]  Dept: [phone]  and follow the prompts.  Office hours are 8:00 a.m. to 4:30 p.m. Monday - Friday. Please note that voicemails left after 4:00 p.m. may not be returned until the following business day.  We are closed weekends and major holidays. You have access to a nurse at all times for urgent questions. Please call the main number to the clinic Dept: [phone] and follow the prompts.   For any non-urgent questions, you may also contact your provider using MyChart. We now offer e-Visits for anyone 25 and older to request care online for non-urgent symptoms. For details visit [email or web address].   Also download the MyChart app! Go to the app store, search "MyChart", open the app, select [facility], and log in with your MyChart username and password.  Masks are optional in the cancer centers. If you would like for your care team to wear a mask while they are taking care of you, please let them know. You may have one support person who is at least 64 years old accompany you for your appointments.

## 2022-06-08 LAB — CANCER ANTIGEN 27.29: CA 27.29: 221.8 U/mL — ABNORMAL HIGH (ref 0.0–38.6)

## 2022-06-10 ENCOUNTER — Telehealth: Payer: Self-pay | Admitting: *Deleted

## 2022-06-10 ENCOUNTER — Telehealth: Payer: Self-pay | Admitting: Radiation Oncology

## 2022-06-10 NOTE — Telephone Encounter (Signed)
-----   Message from Rafael Bihari, RN sent at 06/07/2022 12:27 PM EDT ----- Regarding: Dr Burr Medico Pt, first time Halaven Pt came in 06/07/2022 for first time Halaven infusion. Dr Burr Medico pt. Tolerated infusion well. Needs call back.

## 2022-06-10 NOTE — Telephone Encounter (Signed)
LM for patient for chemo follow up

## 2022-06-10 NOTE — Telephone Encounter (Signed)
Called patient to schedule a follow up visit w. Bryson Ha. No answer, LVM for a return call.

## 2022-06-11 ENCOUNTER — Telehealth: Payer: Self-pay | Admitting: Hematology

## 2022-06-11 NOTE — Telephone Encounter (Signed)
Left patient a voicemail regarding upcoming appointments  

## 2022-06-12 NOTE — Progress Notes (Signed)
Location(s) of Symptomatic Metastases: Left Inguinal   She presented with worsening pain and tenderness in her left groin in early 04/2022.  She reports the pain is mainly in her groin and radiates to her left hip and down her inner thigh.  CT CAP 05/28/2022:   Apparent interval mixed response to therapy with some of the retroperitoneal lymph nodes and right groin nodes measuring smaller today than on the 02/25/2022 exam. Other retroperitoneal, left pelvic sidewall, and left groin lymph nodes have enlarged in the interval. 19 mm short axis left internal iliac node on today's study is essentially new in the interval.  Stable appearance of mixed lytic and sclerotic lesion in the right iliac bone anteriorly.  CT CAP 02/25/2022:  Although the right groin adenopathy has mildly improved, there is substantially new/progressive upper abdominal adenopathy including a 2.6 cm in short axis right aortocaval lymph node, signaling mixed appearance but upper abdominal progression.  There is also substantial increase in retroperitoneal stranding tracking down along the iliac vessels, right greater than left. Some of this could be from lymphatic obstruction related to the adenopathy, with entities such as vasculitis considered less likely.  There is also stranding extending in the right groin along the regional lymph nodes, and lateral to the right hip in the subcutaneous tissues.  Stable appearance of the sclerotic and lucent lesion of the right iliac bone compatible with prior treated metastatic lesion.   Past/Anticipated chemotherapy by medical oncology, if any:  Dr. Burr Medico 06/07/2022 -I reviewed the results with her today. Given the scan results and her worsening pain, I recommend changing treatment to Halaven. We will start today. I will also make an urgent referral to rad onc for additional treatment. -I also discussed the role of immunotherapy in triple negative disease, I recommend adding on Keytruda.  If her  insurance denies, we will try drug replacement. -aside from pain, she is doing well overall. Labs reviewed, overall stable and adequate to start Halaven today.   Pain on a scale of 0-10 is:  Bilateral groin and inner thighs, pain when pressed/massaged.   Ambulatory status? Walker? Wheelchair?: Ambulatory   SAFETY ISSUES: Prior radiation? Left Breast 4/10-12/17/2017, Right Iliac/Hip 1/24- 09/22/2020, Right Pelvis 6/19-6/30/2023. Pacemaker/ICD? No Possible current pregnancy? Postmenopausal Is the patient on methotrexate? No  Current Complaints / other details:   [employer]

## 2022-06-13 ENCOUNTER — Encounter: Payer: Self-pay | Admitting: Radiation Oncology

## 2022-06-13 ENCOUNTER — Ambulatory Visit
Admission: RE | Admit: 2022-06-13 | Discharge: 2022-06-13 | Disposition: A | Discharge status: Home / Self Care | Payer: Medicare Other | Source: Ambulatory Visit | Attending: Radiation Oncology | Admitting: Radiation Oncology

## 2022-06-13 ENCOUNTER — Other Ambulatory Visit: Payer: Self-pay

## 2022-06-13 VITALS — BP 138/88 | HR 79 | Temp 97.8°F | Resp 20 | Ht 67.0 in | Wt 220.4 lb

## 2022-06-13 DIAGNOSIS — Z171 Estrogen receptor negative status [ER-]: Secondary | ICD-10-CM | POA: Diagnosis not present

## 2022-06-13 DIAGNOSIS — Z803 Family history of malignant neoplasm of breast: Secondary | ICD-10-CM | POA: Diagnosis not present

## 2022-06-13 DIAGNOSIS — I2510 Atherosclerotic heart disease of native coronary artery without angina pectoris: Secondary | ICD-10-CM | POA: Diagnosis not present

## 2022-06-13 DIAGNOSIS — E119 Type 2 diabetes mellitus without complications: Secondary | ICD-10-CM | POA: Diagnosis not present

## 2022-06-13 DIAGNOSIS — I10 Essential (primary) hypertension: Secondary | ICD-10-CM | POA: Insufficient documentation

## 2022-06-13 DIAGNOSIS — Z7982 Long term (current) use of aspirin: Secondary | ICD-10-CM | POA: Diagnosis not present

## 2022-06-13 DIAGNOSIS — Z87891 Personal history of nicotine dependence: Secondary | ICD-10-CM | POA: Diagnosis not present

## 2022-06-13 DIAGNOSIS — C774 Secondary and unspecified malignant neoplasm of inguinal and lower limb lymph nodes: Secondary | ICD-10-CM

## 2022-06-13 DIAGNOSIS — Z79899 Other long term (current) drug therapy: Secondary | ICD-10-CM | POA: Diagnosis not present

## 2022-06-13 DIAGNOSIS — C7951 Secondary malignant neoplasm of bone: Secondary | ICD-10-CM

## 2022-06-13 DIAGNOSIS — Z923 Personal history of irradiation: Secondary | ICD-10-CM | POA: Diagnosis not present

## 2022-06-13 DIAGNOSIS — C50512 Malignant neoplasm of lower-outer quadrant of left female breast: Secondary | ICD-10-CM

## 2022-06-13 DIAGNOSIS — E782 Mixed hyperlipidemia: Secondary | ICD-10-CM | POA: Insufficient documentation

## 2022-06-13 DIAGNOSIS — Z794 Long term (current) use of insulin: Secondary | ICD-10-CM | POA: Insufficient documentation

## 2022-06-13 DIAGNOSIS — Z800 Family history of malignant neoplasm of digestive organs: Secondary | ICD-10-CM | POA: Insufficient documentation

## 2022-06-13 DIAGNOSIS — Z9221 Personal history of antineoplastic chemotherapy: Secondary | ICD-10-CM | POA: Insufficient documentation

## 2022-06-13 DIAGNOSIS — I420 Dilated cardiomyopathy: Secondary | ICD-10-CM | POA: Insufficient documentation

## 2022-06-13 NOTE — Progress Notes (Signed)
Radiation Oncology         ([phone]) [phone] ________________________________  Name: Kristin Ward        MRN: [id number]  Date of Service: 06/13/2022 DOB: Oct 02, 1955  [id number], Elyse Jarvis, MD  [name], MD     REFERRING PHYSICIAN: [name], MD   DIAGNOSIS: The encounter diagnosis was Malignant neoplasm of lower-outer quadrant of left breast of female, estrogen receptor negative ([place]).   HISTORY OF PRESENT ILLNESS: Kristin Ward is a 66 y.o. 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 She underwent left lumpectomy with sentinel node biopsy on 06/26/17 revealing a grade 3,  1.2 cm invasive ductal carcinoma with all margins negative for disease, and no involvement of the three sampled notes. She went on to begin adjuvant chemotherapy on 07/25/17, and her last treatment was on 09/29/17 to complete 4 cycles of taxotere/cytoxan.    She was diagnosed with recurrent disease in the right iliac wing and a biopsy on 08/22/20 showed metastatic carcinoma consistent with breast carcinoma, her prognostic markers confirmed ER positivity at 20% with weak staining, PR negative. She received palliative radiotherapy to her right hip/iliac region and completed this with [place] in February 2022.  In April 2020 she had progressive disease with right inguinal adenopathy and completed a palliative course of radiotherapy in June 2023.  She is seen today to consider radiotherapy to the left aspect of the pelvis after restaging imaging on 05/28/2022 showed a mixed response in her retroperitoneal and right groin adenopathy but progressive left pelvic sidewall left inguinal lymph nodes and progressive change in some of her retroperitoneal nodes.  Her regimen has included  Dorothey Baseman since July 2023 and Dr. Burr Medico discussed the possibility of adding Keytruda.  She is seen today to discuss additional palliative radiotherapy to the left groin.  The patient is  complaining of some pain in this region which she states has been persistent.  PREVIOUS RADIATION THERAPY:      Radiation Treatment Dates: 01/28/2022 through 02/08/2022 Site Technique Total Dose (Gy) Dose per Fx (Gy) Completed Fx Beam Energies  Pelvis: Pelvis_Rt ingl 3D 30/30 3 10/10 6X, 15X     09/04/2020 through 09/22/2020 Site Technique Total Dose (Gy) Dose per Fx (Gy) Completed Fx Beam Energies  Pelvis: Pelvis 3D 37.5/37.5 2.5 15/15 15X    11/19/17 - 12/17/17: Left breast treated to 42.56 Gy with 17 fx of 2.5 Gy followed by a boost of 7.5 Gy with 3 fx of 2.5 Gy  PAST MEDICAL HISTORY:  Past Medical History:  Diagnosis Date   Abnormal x-ray of lungs with single pulmonary nodule 03/15/2016   per records from [place]    Anemia    Asthma    Cholelithiasis 05/19/2017   On CT   Coronary artery calcification seen on CAT scan 05/19/2017   Diabetes mellitus without complication ([place])    type 2   Diabetes mellitus, new onset ([place]) 01/20/2019   Dilated idiopathic cardiomyopathy ([place]) 12/2015   EF 15-20%. Diagnosed in [facility]   Headache    NONE RECENT   Hypertension    Lymph nodes enlarged 07/25/2020   Malignant neoplasm of lower-outer quadrant of left breast of female, estrogen receptor negative ([place]) 06/11/2017   left breast   Mixed hyperlipidemia 06/17/2018   partially blind    Personal history of chemotherapy 2018-2019   Personal history of radiation therapy 2019   Retinitis pigmentosa    Retroperitoneal lymphadenopathy  07/25/2020   Uveitis        PAST SURGICAL HISTORY: Past Surgical History:  Procedure Laterality Date   brain cyst removed  2007   to help with headaches per patient , aspirated    BREAST BIOPSY Left 06/06/2017   x2   BREAST CYST ASPIRATION Left 06/06/2017   BREAST LUMPECTOMY Left 06/26/2017   BREAST LUMPECTOMY WITH RADIOACTIVE SEED AND SENTINEL LYMPH NODE BIOPSY Left 06/26/2017   Procedure: BREAST LUMPECTOMY WITH RADIOACTIVE SEED AND  SENTINEL LYMPH NODE BIOPSY ERAS  [facility]AY;  Surgeon: [name], MD;  Location: [facility];  Service: General;  Laterality: Left;  pec block   FINE NEEDLE ASPIRATION Left 02/23/2019   Procedure: FINE NEEDLE ASPIRATION;  Surgeon: [name], MD;  Location: [facility];  Service: General;  Laterality: Left;  Asiration of left breast seroma    IR IMAGING GUIDED PORT INSERTION  10/13/2020   NASAL TURBINATE REDUCTION     PORT-A-CATH REMOVAL Right 02/23/2019   Procedure: REMOVAL PORT-A-CATH;  Surgeon: [name], MD;  Location: [facility];  Service: General;  Laterality: Right;   PORTACATH PLACEMENT N/A 06/26/2017   Procedure: INSERTION PORT-A-CATH;  Surgeon: [name], MD;  Location: [facility];  Service: General;  Laterality: N/A;   TRANSTHORACIC ECHOCARDIOGRAM  12/2015   A) [facility] May 2017: Mild concentric LVH. Global hypokinesis. GR daily. EF 18% severe LA dilation. Mitral annular dilatation with papillary muscle dysfunction and moderate MR. Dilated IVC consistent with elevated RAP.   TRANSTHORACIC ECHOCARDIOGRAM  05/2017; 08/2017   a) Mild concentric LVH.  EF 45 to 50% with diffuse hypokinesis.  GR 1 DD.  Mild aortic root dilation.;; b)  Mild LVH EF 45 to 50%.  Diffuse HK.  No significant valve disease.  No significant change.   TRANSTHORACIC ECHOCARDIOGRAM  01/30/2022   Pre[place]:EF 35 to 40%.  Moderate dysfunction with global HK.  Abnormal strain.  Normal RV size and function.  Normal RVSP.  Aortic root estimated 42 mm.  Mild MR.  Mild/trivial AI.  Normal RVP.;  04/01/2022: Preliminary read 40%.  Formal read 45 to 50% (probably 40 to 45%).  Global HK.  GR 1 DD.  Normal RV size and RVP.  Mild MR.  Now measured at 38 mm.  Normal RAP.     FAMILY HISTORY:  Family History  Problem Relation Age of Onset   Colon cancer Mother 75       deceased 12   Heart attack Father    Breast cancer Other 6       2nd cousin on maternal side; currently 85    Pancreatic cancer Other        2nd cousin once removed on maternal side; deceased 34s   Fibromyalgia Sister    Esophageal cancer Neg Hx    Rectal cancer Neg Hx    Stomach cancer Neg Hx      SOCIAL HISTORY:  reports that she quit smoking about 6 years ago. Her smoking use included cigarettes. She has a 5.75 pack-year smoking history. She has never used smokeless tobacco. She reports that she does not drink alcohol and does not use drugs. The patient is widowed and lives in [place].  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    ALLERGIES: Morphine and related, Latex, Tylenol with codeine #3 [acetaminophen-codeine], and Penicillins   MEDICATIONS:  Current Outpatient Medications  Medication Sig Dispense Refill   Accu-Chek FastClix Lancets  MISC TEST TWICE A DAY. PT USES AN ACCU-CHEK GUIDE ME METER 102 each 2   amLODipine (NORVASC) 5 MG tablet Take 1 tablet (5 mg total) by mouth daily. (Patient not taking: Reported on 04/29/2022) 30 tablet 10   Ascorbic Acid (VITAMIN C) 500 MG CAPS as directed Orally     aspirin 81 MG chewable tablet 1 tablet Orally Once a day     atorvastatin (LIPITOR) 40 MG tablet Take 1 tablet (40 mg total) by mouth daily. 90 tablet 3   brimonidine (ALPHAGAN) 0.2 % ophthalmic solution Place 1 drop into both eyes 2 (two) times daily.     carvedilol (COREG) 12.5 MG tablet TAKE 1 AND 1/2 TABLETS BY MOUTH TWICE DAILY qm 90 tablet 10   cholecalciferol (VITAMIN D3) 25 MCG (1000 UNIT) tablet Take 2,000 Units by mouth daily.     Continuous Blood Gluc Receiver (DEXCOM G7 RECEIVER) DEVI 1 Device by Does not apply route 2 (two) times daily. (Patient not taking: Reported on 04/29/2022) 1 each 1   Continuous Blood Gluc Sensor (DEXCOM G7 SENSOR) MISC Use as directed, replace every 10 DAYS (Patient not taking: Reported on 04/29/2022) 3 each 10   furosemide (LASIX) 20 MG tablet TAKE 2 TABLETS (40 MG TOTAL) BY MOUTH AS NEEDED FOR WEIGHT GAIN  3 TO 4 LBS. 60 tablet 10   HUMALOG KWIKPEN 100 UNIT/ML KwikPen Inject 3-10 Units into the skin 3 (three) times daily before meals. Per sliding scale     HYDROcodone-acetaminophen (NORCO/VICODIN) 5-325 MG tablet Take 1 tablet by mouth every 6 (six) hours as needed for moderate pain. 30 tablet 0   ibuprofen (ADVIL) 800 MG tablet Take 1 tablet (800 mg total) by mouth every 8 (eight) hours as needed. 30 tablet 2   Lancets Misc. (ACCU-CHEK SOFTCLIX LANCET DEV) KIT 1 Units by Does not apply route 2 (two) times daily. Check FSBS BID - Include strips # 50 with 5 refills, Lancets #50 with 5 refills DX: Type 2 DM - ICD 10: E11.9 1 kit 0   lidocaine-prilocaine (EMLA) cream Apply 1 application topically as needed. (Patient not taking: Reported on 04/29/2022) 30 g 0   linaclotide (LINZESS) 72 MCG capsule Take 72 mcg by mouth as needed.     Multiple Vitamins-Minerals (WOMENS MULTI PO) Take 15 mLs by mouth daily.     ondansetron (ZOFRAN) 8 MG tablet Take 1 tablet (8 mg total) by mouth every 8 (eight) hours as needed for nausea or vomiting (begin on day 3 after chemo). 30 tablet 1   oxyCODONE (OXY IR/ROXICODONE) 5 MG immediate release tablet Take 1 tablet (5 mg total) by mouth every 8 (eight) hours as needed for severe pain. (Patient not taking: Reported on 04/29/2022) 60 tablet 0   pantoprazole (PROTONIX) 40 MG tablet TAKE 1 TABLET BY MOUTH EVERY DAY 90 tablet 1   prochlorperazine (COMPAZINE) 10 MG tablet Take 1 tablet (10 mg total) by mouth every 6 (six) hours as needed (Nausea or vomiting). 30 tablet 1   promethazine (PHENERGAN) 25 MG tablet Take 1 tablet (25 mg total) by mouth every 6 (six) hours as needed for nausea or vomiting. 30 tablet 0   sacubitril-valsartan (ENTRESTO) 97-103 MG Take 1 tablet by mouth 2 (two) times daily. 60 tablet 10   spironolactone (ALDACTONE) 25 MG tablet Take 1 tablet (25 mg total) by mouth daily. 30 tablet 10   vitamin B-12 (CYANOCOBALAMIN) 1000 MCG tablet Take 1,000 mcg by mouth daily.      zinc gluconate  50 MG tablet Take 50 mg by mouth daily.     Current Facility-Administered Medications  Medication Dose Route Frequency Provider Last Rate Last Admin   insulin glulisine (APIDRA) injection 6 Units  6 Units Subcutaneous Once Francis Gaines B, PA-C         REVIEW OF SYSTEMS: A complete review of systems was reviewed with the patient and pertinent positives findings are noted in the history of present illness.   PHYSICAL EXAM:  Wt Readings from Last 3 Encounters:  06/07/22 215 lb 1.6 oz (97.6 kg)  05/10/22 219 lb 1.6 oz (99.4 kg)  04/29/22 209 lb 9.6 oz (95.1 kg)   Temp Readings from Last 3 Encounters:  06/07/22 98.4 F (36.9 C) (Oral)  05/10/22 97.9 F (36.6 C) (Oral)  04/29/22 (!) 97.3 F (36.3 C)   BP Readings from Last 3 Encounters:  06/07/22 (!) 154/83  06/07/22 (!) 160/98  05/10/22 138/86   Pulse Readings from Last 3 Encounters:  06/07/22 95  06/07/22 (!) 104  05/10/22 83   In general this is a well appearing African American female in no acute distress. She's alert and oriented x4 and appropriate throughout the examination. Cardiopulmonary assessment is negative for acute distress and she exhibits normal effort.       ECOG = 1  0 - Asymptomatic (Fully active, able to carry on all predisease activities without restriction)  1 - Symptomatic but completely ambulatory (Restricted in physically strenuous activity but ambulatory and able to carry out work of a light or sedentary nature. For example, light housework, office work)  2 - Symptomatic, <50% in bed during the day (Ambulatory and capable of all self care but unable to carry out any work activities. Up and about more than 50% of waking hours)  3 - Symptomatic, >50% in bed, but not bedbound (Capable of only limited self-care, confined to bed or chair 50% or more of waking hours)  4 - Bedbound (Completely disabled. Cannot carry on any self-care. Totally confined to bed or chair)  5 -  Death   Eustace Pen MM, Creech RH, Tormey DC, et al. [phone]). "Toxicity and response criteria of the [facility]". [place] Oncol. 5 (6): 649-55    LABORATORY DATA:  Lab Results  Component Value Date   WBC 6.6 06/07/2022   HGB 10.7 (L) 06/07/2022   HCT 32.0 (L) 06/07/2022   MCV 92.5 06/07/2022   PLT 342 06/07/2022   Lab Results  Component Value Date   NA 139 06/07/2022   K 3.9 06/07/2022   CL 106 06/07/2022   CO2 28 06/07/2022   Lab Results  Component Value Date   ALT 22 06/07/2022   AST 31 06/07/2022   ALKPHOS 117 06/07/2022   BILITOT 0.5 06/07/2022      RADIOGRAPHY: CT CHEST ABDOMEN PELVIS W CONTRAST  Result Date: 05/29/2022 CLINICAL DATA:  Breast cancer restaging.  * Tracking Code: BO * EXAM: CT CHEST, ABDOMEN, AND PELVIS WITH CONTRAST TECHNIQUE: Multidetector CT imaging of the chest, abdomen and pelvis was performed following the standard protocol during bolus administration of intravenous contrast. RADIATION DOSE REDUCTION: This exam was performed according to the departmental dose-optimization program which includes automated exposure control, adjustment of the mA and/or kV according to patient size and/or use of iterative reconstruction technique. CONTRAST:  11m OMNIPAQUE IOHEXOL 300 MG/ML  SOLN COMPARISON:  02/25/2022 FINDINGS: CT CHEST FINDINGS Cardiovascular: Heart is upper normal to mildly enlarged. No thoracic aortic aneurysm. Right-sided Port-A-Cath tip is  positioned at the SVC/RA junction. Probable small fibrin sheath the tip of the catheter. Mediastinum/Nodes: No mediastinal lymphadenopathy. There is no hilar lymphadenopathy. The esophagus has normal imaging features. There is no axillary lymphadenopathy. Surgical clips noted left axilla. Lungs/Pleura: No suspicious pulmonary nodule or mass. No focal airspace consolidation. No pleural effusion. Musculoskeletal: No worrisome lytic or sclerotic osseous abnormality. Stable probable seroma or chronic  hematoma in the left breast after lumpectomy. CT ABDOMEN PELVIS FINDINGS Hepatobiliary: No suspicious focal abnormality within the liver parenchyma. Calcified gallstone evident measuring 8 mm. No intrahepatic or extrahepatic biliary dilation. Pancreas: No focal mass lesion. No dilatation of the main duct. No intraparenchymal cyst. No peripancreatic edema. Spleen: No splenomegaly. No focal mass lesion. Adrenals/Urinary Tract: No adrenal nodule or mass. Kidneys unremarkable. No evidence for hydroureter. Bladder wall is ill-defined. Stomach/Bowel: Stomach is unremarkable. No gastric wall thickening. No evidence of outlet obstruction. Duodenum is normally positioned as is the ligament of Treitz. No small bowel wall thickening. No small bowel dilatation. The terminal ileum is normal. The appendix is normal. No gross colonic mass. No colonic wall thickening. Vascular/Lymphatic: There is moderate atherosclerotic calcification of the abdominal aorta without aneurysm. Retroperitoneal stranding and lymphadenopathy again noted. Measurements of retroperitoneal lymphadenopathy on the previous study did not persist and have been remeasured for comparison purposes today. 19 mm short axis aortocaval node on 71/2 today was 27 mm previously. 13 mm left common iliac node on 83/2 was 17 mm previously. 19 mm short axis left internal iliac node on 97/2 was 6 mm previously. 11 mm left para-aortic node on 76/2 was 6 mm previously. 20 mm short axis left external iliac lymph node on 114/2 was 9 mm previously. 18 mm left groin node on 134/2 was 7 mm previously. 12 mm short axis right groin node on 125/2 was 15 mm previously. Reproductive: Uterine fibroid disease is evident. There is no adnexal mass. Other: No intraperitoneal free fluid. Musculoskeletal: Mixed lytic and sclerotic lesion in the right iliac bone anteriorly is similar to prior. IMPRESSION: 1. Apparent interval mixed response to therapy with some of the retroperitoneal lymph nodes  and right groin nodes measuring smaller today than on the 02/25/2022 exam. Other retroperitoneal, left pelvic sidewall, and left groin lymph nodes have enlarged in the interval. 19 mm short axis left internal iliac node on today's study is essentially new in the interval. 2. Stable appearance of mixed lytic and sclerotic lesion in the right iliac bone anteriorly. 3. Cholelithiasis. 4. Aortic Atherosclerosis (ICD10-I70.0). Electronically Signed   By: Misty Stanley M.D.   On: 05/29/2022 08:02        IMPRESSION/PLAN: 1. 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 I reviewed her recent imaging  findings and discussed the rationale for a palliative course of radiotherapy to the left groin/pelvis.  We discussed the risks, benefits, short, and long term effects of radiotherapy, as well as the palliative intent, and the patient is interested in proceeding. We reviewed the delivery and logistics of radiotherapy and anticipates a course of 3 weeks of radiotherapy due to ongoing systemic therapy. Written consent is obtained and placed in the chart, a copy was provided to the patient. She will simulate tomorrow.   In a visit lasting 38 minutes, greater than 50% of the time was spent face to face discussing the patient's condition, in preparation for the discussion, and coordinating the patient's care.   ________________________________   Jenny Reichmann  S. Lisbeth Renshaw, MD, PhD

## 2022-06-14 ENCOUNTER — Ambulatory Visit
Admission: RE | Admit: 2022-06-14 | Discharge: 2022-06-14 | Disposition: A | Discharge status: Home / Self Care | Payer: Medicare Other | Source: Ambulatory Visit | Attending: Radiation Oncology | Admitting: Radiation Oncology

## 2022-06-14 ENCOUNTER — Inpatient Hospital Stay: Payer: Medicare Other | Attending: Hematology

## 2022-06-14 ENCOUNTER — Inpatient Hospital Stay: Payer: Medicare Other

## 2022-06-14 VITALS — BP 142/88 | HR 86 | Temp 98.2°F | Resp 18 | Wt 218.5 lb

## 2022-06-14 DIAGNOSIS — C774 Secondary and unspecified malignant neoplasm of inguinal and lower limb lymph nodes: Secondary | ICD-10-CM | POA: Insufficient documentation

## 2022-06-14 DIAGNOSIS — I5042 Chronic combined systolic (congestive) and diastolic (congestive) heart failure: Secondary | ICD-10-CM | POA: Insufficient documentation

## 2022-06-14 DIAGNOSIS — C7951 Secondary malignant neoplasm of bone: Secondary | ICD-10-CM | POA: Insufficient documentation

## 2022-06-14 DIAGNOSIS — C50512 Malignant neoplasm of lower-outer quadrant of left female breast: Secondary | ICD-10-CM | POA: Insufficient documentation

## 2022-06-14 DIAGNOSIS — Z510 Encounter for antineoplastic radiation therapy: Secondary | ICD-10-CM | POA: Insufficient documentation

## 2022-06-14 DIAGNOSIS — Z95828 Presence of other vascular implants and grafts: Secondary | ICD-10-CM

## 2022-06-14 DIAGNOSIS — Z170 Estrogen receptor positive status [ER+]: Secondary | ICD-10-CM | POA: Diagnosis not present

## 2022-06-14 DIAGNOSIS — Z5111 Encounter for antineoplastic chemotherapy: Secondary | ICD-10-CM | POA: Insufficient documentation

## 2022-06-14 DIAGNOSIS — Z7189 Other specified counseling: Secondary | ICD-10-CM

## 2022-06-14 DIAGNOSIS — I110 Hypertensive heart disease with heart failure: Secondary | ICD-10-CM | POA: Insufficient documentation

## 2022-06-14 DIAGNOSIS — Z171 Estrogen receptor negative status [ER-]: Secondary | ICD-10-CM

## 2022-06-14 DIAGNOSIS — C50511 Malignant neoplasm of lower-outer quadrant of right female breast: Secondary | ICD-10-CM | POA: Insufficient documentation

## 2022-06-14 LAB — CMP (CANCER CENTER ONLY)
ALT: 24 U/L (ref 0–44)
AST: 41 U/L (ref 15–41)
Albumin: 3.7 g/dL (ref 3.5–5.0)
Alkaline Phosphatase: 97 U/L (ref 38–126)
Anion gap: 7 (ref 5–15)
BUN: 13 mg/dL (ref 8–23)
CO2: 27 mmol/L (ref 22–32)
Calcium: 9.4 mg/dL (ref 8.9–10.3)
Chloride: 109 mmol/L (ref 98–111)
Creatinine: 0.63 mg/dL (ref 0.44–1.00)
GFR, Estimated: >60 mL/min (ref >60)
Glucose, Bld: 128 mg/dL — ABNORMAL HIGH (ref 70–99)
Potassium: 3.5 mmol/L (ref 3.5–5.1)
Sodium: 143 mmol/L (ref 135–145)
Total Bilirubin: 0.5 mg/dL (ref 0.3–1.2)
Total Protein: 7.6 g/dL (ref 6.5–8.1)

## 2022-06-14 LAB — CBC WITH DIFFERENTIAL (CANCER CENTER ONLY)
Abs Immature Granulocytes: 0 10*3/uL (ref 0.00–0.07)
Basophils Absolute: 0 10*3/uL (ref 0.0–0.1)
Basophils Relative: 1 %
Eosinophils Absolute: 0.1 10*3/uL (ref 0.0–0.5)
Eosinophils Relative: 3 %
HCT: 30.4 % — ABNORMAL LOW (ref 36.0–46.0)
Hemoglobin: 10.1 g/dL — ABNORMAL LOW (ref 12.0–15.0)
Lymphocytes Relative: 28 %
Lymphs Abs: 0.8 10*3/uL (ref 0.7–4.0)
MCH: 30.8 pg (ref 26.0–34.0)
MCHC: 33.2 g/dL (ref 30.0–36.0)
MCV: 92.7 fL (ref 80.0–100.0)
Monocytes Absolute: 0.1 10*3/uL (ref 0.1–1.0)
Monocytes Relative: 4 %
Neutro Abs: 1.9 10*3/uL (ref 1.7–7.7)
Neutrophils Relative %: 64 %
Platelet Count: 312 10*3/uL (ref 150–400)
RBC: 3.28 MIL/uL — ABNORMAL LOW (ref 3.87–5.11)
RDW: 14.8 % (ref 11.5–15.5)
WBC Count: 3 10*3/uL — ABNORMAL LOW (ref 4.0–10.5)
nRBC: 0 % (ref 0.0–0.2)

## 2022-06-14 LAB — MAGNESIUM: Magnesium: 1.9 mg/dL (ref 1.7–2.4)

## 2022-06-14 MED ORDER — HEPARIN SOD (PORK) LOCK FLUSH 100 UNIT/ML IV SOLN
500.0000 [IU] | Freq: Once | INTRAVENOUS | Status: AC | PRN
  Administered 2022-06-14: 500 [IU]

## 2022-06-14 MED ORDER — SODIUM CHLORIDE 0.9% FLUSH
10.0000 mL | INTRAVENOUS | Status: DC | PRN
  Administered 2022-06-14: 10 mL via INTRAVENOUS

## 2022-06-14 MED ORDER — SODIUM CHLORIDE 0.9 % IV SOLN
3.0000 mg | Freq: Once | INTRAVENOUS | Status: AC
  Administered 2022-06-14: 3 mg via INTRAVENOUS
  Filled 2022-06-14: qty 6

## 2022-06-14 MED ORDER — SODIUM CHLORIDE 0.9% FLUSH
10.0000 mL | INTRAVENOUS | Status: DC | PRN
  Administered 2022-06-14: 10 mL

## 2022-06-14 MED ORDER — PROCHLORPERAZINE MALEATE 10 MG PO TABS
10.0000 mg | ORAL_TABLET | Freq: Once | ORAL | Status: AC
  Administered 2022-06-14: 10 mg via ORAL
  Filled 2022-06-14: qty 1

## 2022-06-14 MED ORDER — SODIUM CHLORIDE 0.9 % IV SOLN: Freq: Once | INTRAVENOUS | Status: AC

## 2022-06-14 NOTE — Progress Notes (Signed)
Ok to start [place] with cycle 2 per MD. Beryle Flock denied and assistance application pending.  Raul [place], [place], BCPS, BCOP 06/14/2022 10:34 AM

## 2022-06-14 NOTE — Patient Instructions (Addendum)
[facility]  Discharge Instructions: Thank you for choosing [facility] to provide your oncology and hematology care.   If you have a lab appointment with the [facility], please go directly to the [facility] and check in at the registration area.   Wear comfortable clothing and clothing appropriate for easy access to any Portacath or PICC line.   We strive to give you quality time with your provider. You may need to reschedule your appointment if you arrive late (15 or more minutes).  Arriving late affects you and other patients whose appointments are after yours.  Also, if you miss three or more appointments without notifying the office, you may be dismissed from the clinic at the provider's discretion.      For prescription refill requests, have your pharmacy contact our office and allow 72 hours for refills to be completed.    Today you received the following chemotherapy and/or immunotherapy agents: [place]      To help prevent nausea and vomiting after your treatment, we encourage you to take your nausea medication as directed.  BELOW ARE SYMPTOMS THAT SHOULD BE REPORTED IMMEDIATELY: *FEVER GREATER THAN 100.4 F (38 C) OR HIGHER *CHILLS OR SWEATING *NAUSEA AND VOMITING THAT IS NOT CONTROLLED WITH YOUR NAUSEA MEDICATION *UNUSUAL SHORTNESS OF BREATH *UNUSUAL BRUISING OR BLEEDING *URINARY PROBLEMS (pain or burning when urinating, or frequent urination) *BOWEL PROBLEMS (unusual diarrhea, constipation, pain near the anus) TENDERNESS IN MOUTH AND THROAT WITH OR WITHOUT PRESENCE OF ULCERS (sore throat, sores in mouth, or a toothache) UNUSUAL RASH, SWELLING OR PAIN  UNUSUAL VAGINAL DISCHARGE OR ITCHING   Items with * indicate a potential emergency and should be followed up as soon as possible or go to the Emergency Department if any problems should occur.  Please show the CHEMOTHERAPY ALERT CARD or IMMUNOTHERAPY ALERT CARD at check-in to  the Emergency Department and triage nurse.  Should you have questions after your visit or need to cancel or reschedule your appointment, please contact [facility]  Dept: [phone]  and follow the prompts.  Office hours are 8:00 a.m. to 4:30 p.m. Monday - Friday. Please note that voicemails left after 4:00 p.m. may not be returned until the following business day.  We are closed weekends and major holidays. You have access to a nurse at all times for urgent questions. Please call the main number to the clinic Dept: [phone] and follow the prompts.   For any non-urgent questions, you may also contact your provider using MyChart. We now offer e-Visits for anyone 75 and older to request care online for non-urgent symptoms. For details visit [email or web address].   Also download the MyChart app! Go to the app store, search "MyChart", open the app, select [facility], and log in with your MyChart username and password.  Masks are optional in the cancer centers. If you would like for your care team to wear a mask while they are taking care of you, please let them know. You may have one support person who is at least 65 years old accompany you for your appointments.

## 2022-06-19 ENCOUNTER — Telehealth: Payer: Self-pay

## 2022-06-19 NOTE — Telephone Encounter (Signed)
Pt called c/o of sore throat and mouth pain.  Pt stated it's one of the side effects of her oral medications that Dr. Burr Medico prescribed.  Pt stated she's tried gargling with warm salt water, chloraseptic spray, and drinking with warm tea.  Pt stated she took a Covid test Monday 06/17/2022 which was negative.  Pt would like to speak with Dr. Burr Medico regarding her symptoms and to see if Dr. Burr Medico could prescribe her something for her oral discomfort.  Notified Dr. Burr Medico of the pt's call.

## 2022-06-20 ENCOUNTER — Other Ambulatory Visit: Payer: Self-pay

## 2022-06-20 ENCOUNTER — Other Ambulatory Visit (HOSPITAL_COMMUNITY): Payer: Self-pay

## 2022-06-20 ENCOUNTER — Telehealth: Payer: Self-pay

## 2022-06-20 MED ORDER — NYSTATIN 100000 UNIT/ML MT SUSP
5.0000 mL | Freq: Four times a day (QID) | OROMUCOSAL | 0 refills | Status: DC | PRN
  Filled 2022-06-20: qty 140, 7d supply, fill #0

## 2022-06-20 NOTE — Telephone Encounter (Signed)
Spoke with pt via telephone regarding prescription sent to [facility] for magic mouthwash w/Lidocaine for pt's oral symptoms.  Instructed pt to take 30m by mouth swish and swallow QID PRN right before eating (recommended).  Pt verbalized understanding and had no further questions at this time.

## 2022-06-20 NOTE — Progress Notes (Signed)
Verbal order given by Dr. Burr Medico for prescription for Magic Mouthwash with Lidocaine (1:1 equal parts) Nystatin, Diphenhydramine, Maalox, Lidocaine Viscous 72m PO QID PRN 1442mSwish and Swallow.  Prescription sent to [facility]

## 2022-06-21 ENCOUNTER — Other Ambulatory Visit (HOSPITAL_COMMUNITY): Payer: Self-pay

## 2022-06-24 ENCOUNTER — Other Ambulatory Visit: Payer: Self-pay

## 2022-06-24 ENCOUNTER — Ambulatory Visit
Admission: RE | Admit: 2022-06-24 | Discharge: 2022-06-24 | Disposition: A | Discharge status: Home / Self Care | Payer: Medicare Other | Source: Ambulatory Visit | Attending: Radiation Oncology | Admitting: Radiation Oncology

## 2022-06-24 DIAGNOSIS — I5042 Chronic combined systolic (congestive) and diastolic (congestive) heart failure: Secondary | ICD-10-CM | POA: Diagnosis not present

## 2022-06-24 DIAGNOSIS — C774 Secondary and unspecified malignant neoplasm of inguinal and lower limb lymph nodes: Secondary | ICD-10-CM | POA: Diagnosis not present

## 2022-06-24 DIAGNOSIS — Z170 Estrogen receptor positive status [ER+]: Secondary | ICD-10-CM | POA: Diagnosis not present

## 2022-06-24 DIAGNOSIS — I110 Hypertensive heart disease with heart failure: Secondary | ICD-10-CM | POA: Diagnosis not present

## 2022-06-24 DIAGNOSIS — Z510 Encounter for antineoplastic radiation therapy: Secondary | ICD-10-CM | POA: Diagnosis not present

## 2022-06-24 DIAGNOSIS — C7951 Secondary malignant neoplasm of bone: Secondary | ICD-10-CM | POA: Diagnosis not present

## 2022-06-24 DIAGNOSIS — Z171 Estrogen receptor negative status [ER-]: Secondary | ICD-10-CM | POA: Diagnosis not present

## 2022-06-24 DIAGNOSIS — C50512 Malignant neoplasm of lower-outer quadrant of left female breast: Secondary | ICD-10-CM | POA: Diagnosis not present

## 2022-06-24 DIAGNOSIS — Z5111 Encounter for antineoplastic chemotherapy: Secondary | ICD-10-CM | POA: Diagnosis not present

## 2022-06-24 LAB — RAD ONC ARIA SESSION SUMMARY
Course Elapsed Days: 0
Plan Fractions Treated to Date: 1
Plan Prescribed Dose Per Fraction: 2.5 Gy
Plan Total Fractions Prescribed: 15
Plan Total Prescribed Dose: 37.5 Gy
Reference Point Dosage Given to Date: 2.5 Gy
Reference Point Session Dosage Given: 2.5 Gy
Session Number: 1

## 2022-06-25 ENCOUNTER — Ambulatory Visit
Admission: RE | Admit: 2022-06-25 | Discharge: 2022-06-25 | Disposition: A | Discharge status: Home / Self Care | Payer: Medicare Other | Source: Ambulatory Visit | Attending: Radiation Oncology | Admitting: Radiation Oncology

## 2022-06-25 ENCOUNTER — Other Ambulatory Visit: Payer: Self-pay

## 2022-06-25 DIAGNOSIS — Z170 Estrogen receptor positive status [ER+]: Secondary | ICD-10-CM | POA: Diagnosis not present

## 2022-06-25 DIAGNOSIS — C50512 Malignant neoplasm of lower-outer quadrant of left female breast: Secondary | ICD-10-CM | POA: Diagnosis not present

## 2022-06-25 DIAGNOSIS — Z171 Estrogen receptor negative status [ER-]: Secondary | ICD-10-CM | POA: Diagnosis not present

## 2022-06-25 DIAGNOSIS — C7951 Secondary malignant neoplasm of bone: Secondary | ICD-10-CM | POA: Diagnosis not present

## 2022-06-25 DIAGNOSIS — Z510 Encounter for antineoplastic radiation therapy: Secondary | ICD-10-CM | POA: Diagnosis not present

## 2022-06-25 DIAGNOSIS — I110 Hypertensive heart disease with heart failure: Secondary | ICD-10-CM | POA: Diagnosis not present

## 2022-06-25 DIAGNOSIS — Z5111 Encounter for antineoplastic chemotherapy: Secondary | ICD-10-CM | POA: Diagnosis not present

## 2022-06-25 DIAGNOSIS — C774 Secondary and unspecified malignant neoplasm of inguinal and lower limb lymph nodes: Secondary | ICD-10-CM | POA: Diagnosis not present

## 2022-06-25 DIAGNOSIS — I5042 Chronic combined systolic (congestive) and diastolic (congestive) heart failure: Secondary | ICD-10-CM | POA: Diagnosis not present

## 2022-06-25 LAB — RAD ONC ARIA SESSION SUMMARY
Course Elapsed Days: 1
Plan Fractions Treated to Date: 2
Plan Prescribed Dose Per Fraction: 2.5 Gy
Plan Total Fractions Prescribed: 15
Plan Total Prescribed Dose: 37.5 Gy
Reference Point Dosage Given to Date: 5 Gy
Reference Point Session Dosage Given: 2.5 Gy
Session Number: 2

## 2022-06-26 ENCOUNTER — Other Ambulatory Visit: Payer: Self-pay

## 2022-06-26 ENCOUNTER — Ambulatory Visit
Admission: RE | Admit: 2022-06-26 | Discharge: 2022-06-26 | Disposition: A | Discharge status: Home / Self Care | Payer: Medicare Other | Source: Ambulatory Visit | Attending: Radiation Oncology | Admitting: Radiation Oncology

## 2022-06-26 DIAGNOSIS — I5042 Chronic combined systolic (congestive) and diastolic (congestive) heart failure: Secondary | ICD-10-CM | POA: Diagnosis not present

## 2022-06-26 DIAGNOSIS — C774 Secondary and unspecified malignant neoplasm of inguinal and lower limb lymph nodes: Secondary | ICD-10-CM | POA: Diagnosis not present

## 2022-06-26 DIAGNOSIS — C7951 Secondary malignant neoplasm of bone: Secondary | ICD-10-CM | POA: Diagnosis not present

## 2022-06-26 DIAGNOSIS — Z170 Estrogen receptor positive status [ER+]: Secondary | ICD-10-CM | POA: Diagnosis not present

## 2022-06-26 DIAGNOSIS — I110 Hypertensive heart disease with heart failure: Secondary | ICD-10-CM | POA: Diagnosis not present

## 2022-06-26 DIAGNOSIS — Z510 Encounter for antineoplastic radiation therapy: Secondary | ICD-10-CM | POA: Diagnosis not present

## 2022-06-26 DIAGNOSIS — Z171 Estrogen receptor negative status [ER-]: Secondary | ICD-10-CM | POA: Diagnosis not present

## 2022-06-26 DIAGNOSIS — C50512 Malignant neoplasm of lower-outer quadrant of left female breast: Secondary | ICD-10-CM | POA: Diagnosis not present

## 2022-06-26 DIAGNOSIS — Z5111 Encounter for antineoplastic chemotherapy: Secondary | ICD-10-CM | POA: Diagnosis not present

## 2022-06-26 LAB — RAD ONC ARIA SESSION SUMMARY
Course Elapsed Days: 2
Plan Fractions Treated to Date: 3
Plan Prescribed Dose Per Fraction: 2.5 Gy
Plan Total Fractions Prescribed: 15
Plan Total Prescribed Dose: 37.5 Gy
Reference Point Dosage Given to Date: 7.5 Gy
Reference Point Session Dosage Given: 2.5 Gy
Session Number: 3

## 2022-06-27 ENCOUNTER — Other Ambulatory Visit: Payer: Self-pay

## 2022-06-27 ENCOUNTER — Ambulatory Visit
Admission: RE | Admit: 2022-06-27 | Discharge: 2022-06-27 | Disposition: A | Discharge status: Home / Self Care | Payer: Medicare Other | Source: Ambulatory Visit | Attending: Radiation Oncology | Admitting: Radiation Oncology

## 2022-06-27 DIAGNOSIS — Z170 Estrogen receptor positive status [ER+]: Secondary | ICD-10-CM | POA: Diagnosis not present

## 2022-06-27 DIAGNOSIS — Z510 Encounter for antineoplastic radiation therapy: Secondary | ICD-10-CM | POA: Diagnosis not present

## 2022-06-27 DIAGNOSIS — Z5111 Encounter for antineoplastic chemotherapy: Secondary | ICD-10-CM | POA: Diagnosis not present

## 2022-06-27 DIAGNOSIS — C774 Secondary and unspecified malignant neoplasm of inguinal and lower limb lymph nodes: Secondary | ICD-10-CM | POA: Diagnosis not present

## 2022-06-27 DIAGNOSIS — C7951 Secondary malignant neoplasm of bone: Secondary | ICD-10-CM | POA: Diagnosis not present

## 2022-06-27 DIAGNOSIS — I5042 Chronic combined systolic (congestive) and diastolic (congestive) heart failure: Secondary | ICD-10-CM | POA: Diagnosis not present

## 2022-06-27 DIAGNOSIS — Z171 Estrogen receptor negative status [ER-]: Secondary | ICD-10-CM | POA: Diagnosis not present

## 2022-06-27 DIAGNOSIS — C50512 Malignant neoplasm of lower-outer quadrant of left female breast: Secondary | ICD-10-CM | POA: Diagnosis not present

## 2022-06-27 DIAGNOSIS — I110 Hypertensive heart disease with heart failure: Secondary | ICD-10-CM | POA: Diagnosis not present

## 2022-06-27 LAB — RAD ONC ARIA SESSION SUMMARY
Course Elapsed Days: 3
Plan Fractions Treated to Date: 4
Plan Prescribed Dose Per Fraction: 2.5 Gy
Plan Total Fractions Prescribed: 15
Plan Total Prescribed Dose: 37.5 Gy
Reference Point Dosage Given to Date: 10 Gy
Reference Point Session Dosage Given: 2.5 Gy
Session Number: 4

## 2022-06-28 ENCOUNTER — Other Ambulatory Visit: Payer: Self-pay

## 2022-06-28 ENCOUNTER — Ambulatory Visit
Admission: RE | Admit: 2022-06-28 | Discharge: 2022-06-28 | Disposition: A | Discharge status: Home / Self Care | Payer: Medicare Other | Source: Ambulatory Visit | Attending: Radiation Oncology | Admitting: Radiation Oncology

## 2022-06-28 ENCOUNTER — Encounter: Payer: Self-pay | Admitting: Hematology

## 2022-06-28 ENCOUNTER — Inpatient Hospital Stay: Payer: Medicare Other

## 2022-06-28 ENCOUNTER — Inpatient Hospital Stay (HOSPITAL_BASED_OUTPATIENT_CLINIC_OR_DEPARTMENT_OTHER): Payer: Medicare Other | Admitting: Hematology

## 2022-06-28 VITALS — BP 154/99 | HR 93 | Temp 97.6°F | Resp 18 | Wt 224.0 lb

## 2022-06-28 DIAGNOSIS — Z7189 Other specified counseling: Secondary | ICD-10-CM

## 2022-06-28 DIAGNOSIS — C774 Secondary and unspecified malignant neoplasm of inguinal and lower limb lymph nodes: Secondary | ICD-10-CM | POA: Diagnosis not present

## 2022-06-28 DIAGNOSIS — Z171 Estrogen receptor negative status [ER-]: Secondary | ICD-10-CM | POA: Diagnosis not present

## 2022-06-28 DIAGNOSIS — Z510 Encounter for antineoplastic radiation therapy: Secondary | ICD-10-CM | POA: Diagnosis not present

## 2022-06-28 DIAGNOSIS — I110 Hypertensive heart disease with heart failure: Secondary | ICD-10-CM | POA: Diagnosis not present

## 2022-06-28 DIAGNOSIS — C50512 Malignant neoplasm of lower-outer quadrant of left female breast: Secondary | ICD-10-CM

## 2022-06-28 DIAGNOSIS — C7951 Secondary malignant neoplasm of bone: Secondary | ICD-10-CM | POA: Diagnosis not present

## 2022-06-28 DIAGNOSIS — Z170 Estrogen receptor positive status [ER+]: Secondary | ICD-10-CM | POA: Diagnosis not present

## 2022-06-28 DIAGNOSIS — I5042 Chronic combined systolic (congestive) and diastolic (congestive) heart failure: Secondary | ICD-10-CM | POA: Diagnosis not present

## 2022-06-28 DIAGNOSIS — Z95828 Presence of other vascular implants and grafts: Secondary | ICD-10-CM

## 2022-06-28 DIAGNOSIS — Z5111 Encounter for antineoplastic chemotherapy: Secondary | ICD-10-CM | POA: Diagnosis not present

## 2022-06-28 LAB — RAD ONC ARIA SESSION SUMMARY
Course Elapsed Days: 4
Plan Fractions Treated to Date: 5
Plan Prescribed Dose Per Fraction: 2.5 Gy
Plan Total Fractions Prescribed: 15
Plan Total Prescribed Dose: 37.5 Gy
Reference Point Dosage Given to Date: 12.5 Gy
Reference Point Session Dosage Given: 2.5 Gy
Session Number: 5

## 2022-06-28 LAB — CBC WITH DIFFERENTIAL (CANCER CENTER ONLY)
Abs Immature Granulocytes: 0.08 10*3/uL — ABNORMAL HIGH (ref 0.00–0.07)
Basophils Absolute: 0 10*3/uL (ref 0.0–0.1)
Basophils Relative: 1 %
Eosinophils Absolute: 0 10*3/uL (ref 0.0–0.5)
Eosinophils Relative: 0 %
HCT: 30.8 % — ABNORMAL LOW (ref 36.0–46.0)
Hemoglobin: 10.1 g/dL — ABNORMAL LOW (ref 12.0–15.0)
Immature Granulocytes: 3 %
Lymphocytes Relative: 23 %
Lymphs Abs: 0.6 10*3/uL — ABNORMAL LOW (ref 0.7–4.0)
MCH: 30.7 pg (ref 26.0–34.0)
MCHC: 32.8 g/dL (ref 30.0–36.0)
MCV: 93.6 fL (ref 80.0–100.0)
Monocytes Absolute: 0.6 10*3/uL (ref 0.1–1.0)
Monocytes Relative: 24 %
Neutro Abs: 1.2 10*3/uL — ABNORMAL LOW (ref 1.7–7.7)
Neutrophils Relative %: 49 %
Platelet Count: 309 10*3/uL (ref 150–400)
RBC: 3.29 MIL/uL — ABNORMAL LOW (ref 3.87–5.11)
RDW: 16.6 % — ABNORMAL HIGH (ref 11.5–15.5)
WBC Count: 2.5 10*3/uL — ABNORMAL LOW (ref 4.0–10.5)
nRBC: 0 % (ref 0.0–0.2)

## 2022-06-28 LAB — CMP (CANCER CENTER ONLY)
ALT: 13 U/L (ref 0–44)
AST: 25 U/L (ref 15–41)
Albumin: 3.7 g/dL (ref 3.5–5.0)
Alkaline Phosphatase: 84 U/L (ref 38–126)
Anion gap: 4 — ABNORMAL LOW (ref 5–15)
BUN: 7 mg/dL — ABNORMAL LOW (ref 8–23)
CO2: 27 mmol/L (ref 22–32)
Calcium: 8.7 mg/dL — ABNORMAL LOW (ref 8.9–10.3)
Chloride: 111 mmol/L (ref 98–111)
Creatinine: 0.52 mg/dL (ref 0.44–1.00)
GFR, Estimated: >60 mL/min (ref >60)
Glucose, Bld: 108 mg/dL — ABNORMAL HIGH (ref 70–99)
Potassium: 4.4 mmol/L (ref 3.5–5.1)
Sodium: 142 mmol/L (ref 135–145)
Total Bilirubin: 0.5 mg/dL (ref 0.3–1.2)
Total Protein: 7.3 g/dL (ref 6.5–8.1)

## 2022-06-28 LAB — MAGNESIUM: Magnesium: 2.1 mg/dL (ref 1.7–2.4)

## 2022-06-28 MED ORDER — SODIUM CHLORIDE 0.9% FLUSH
10.0000 mL | INTRAVENOUS | Status: DC | PRN
  Administered 2022-06-28: 10 mL

## 2022-06-28 MED ORDER — PROCHLORPERAZINE MALEATE 10 MG PO TABS
10.0000 mg | ORAL_TABLET | Freq: Once | ORAL | Status: AC
  Administered 2022-06-28: 10 mg via ORAL
  Filled 2022-06-28: qty 1

## 2022-06-28 MED ORDER — SODIUM CHLORIDE 0.9% FLUSH
10.0000 mL | INTRAVENOUS | Status: DC | PRN
  Administered 2022-06-28: 10 mL via INTRAVENOUS

## 2022-06-28 MED ORDER — SODIUM CHLORIDE 0.9 % IV SOLN
3.0000 mg | Freq: Once | INTRAVENOUS | Status: AC
  Administered 2022-06-28: 3 mg via INTRAVENOUS
  Filled 2022-06-28: qty 6

## 2022-06-28 MED ORDER — SODIUM CHLORIDE 0.9 % IV SOLN: Freq: Once | INTRAVENOUS | Status: AC

## 2022-06-28 MED ORDER — HEPARIN SOD (PORK) LOCK FLUSH 100 UNIT/ML IV SOLN
500.0000 [IU] | Freq: Once | INTRAVENOUS | Status: AC | PRN
  Administered 2022-06-28: 500 [IU]

## 2022-06-28 NOTE — Progress Notes (Signed)
Per Dr. Burr Medico, ok to treat with ANC 1.2

## 2022-06-28 NOTE — Patient Instructions (Signed)
[facility]  Discharge Instructions: Thank you for choosing [facility] to provide your oncology and hematology care.   If you have a lab appointment with the [facility], please go directly to the [facility] and check in at the registration area.   Wear comfortable clothing and clothing appropriate for easy access to any Portacath or PICC line.   We strive to give you quality time with your provider. You may need to reschedule your appointment if you arrive late (15 or more minutes).  Arriving late affects you and other patients whose appointments are after yours.  Also, if you miss three or more appointments without notifying the office, you may be dismissed from the clinic at the provider's discretion.      For prescription refill requests, have your pharmacy contact our office and allow 72 hours for refills to be completed.    Today you received the following chemotherapy and/or immunotherapy agents haloven      To help prevent nausea and vomiting after your treatment, we encourage you to take your nausea medication as directed.  BELOW ARE SYMPTOMS THAT SHOULD BE REPORTED IMMEDIATELY: *FEVER GREATER THAN 100.4 F (38 C) OR HIGHER *CHILLS OR SWEATING *NAUSEA AND VOMITING THAT IS NOT CONTROLLED WITH YOUR NAUSEA MEDICATION *UNUSUAL SHORTNESS OF BREATH *UNUSUAL BRUISING OR BLEEDING *URINARY PROBLEMS (pain or burning when urinating, or frequent urination) *BOWEL PROBLEMS (unusual diarrhea, constipation, pain near the anus) TENDERNESS IN MOUTH AND THROAT WITH OR WITHOUT PRESENCE OF ULCERS (sore throat, sores in mouth, or a toothache) UNUSUAL RASH, SWELLING OR PAIN  UNUSUAL VAGINAL DISCHARGE OR ITCHING   Items with * indicate a potential emergency and should be followed up as soon as possible or go to the Emergency Department if any problems should occur.  Please show the CHEMOTHERAPY ALERT CARD or IMMUNOTHERAPY ALERT CARD at check-in to the  Emergency Department and triage nurse.  Should you have questions after your visit or need to cancel or reschedule your appointment, please contact [facility]  Dept: [phone]  and follow the prompts.  Office hours are 8:00 a.m. to 4:30 p.m. Monday - Friday. Please note that voicemails left after 4:00 p.m. may not be returned until the following business day.  We are closed weekends and major holidays. You have access to a nurse at all times for urgent questions. Please call the main number to the clinic Dept: [phone] and follow the prompts.   For any non-urgent questions, you may also contact your provider using MyChart. We now offer e-Visits for anyone 22 and older to request care online for non-urgent symptoms. For details visit [email or web address].   Also download the MyChart app! Go to the app store, search "MyChart", open the app, select [facility], and log in with your MyChart username and password.  Masks are optional in the cancer centers. If you would like for your care team to wear a mask while they are taking care of you, please let them know. You may have one support person who is at least 64 years old accompany you for your appointments.

## 2022-06-28 NOTE — Progress Notes (Signed)
[place]   [phone]) [phone] [phone]) [phone]   Clinic Follow up Note   Patient Care Team: Kristin Lung, MD as PCP - General (Family Medicine) [name], MD as PCP - Cardiology (Cardiology) [name], [name], MD as PCP - Advanced Heart Failure (Cardiology) [name], MD as Consulting Physician (Hematology) [name], MD as Consulting Physician (General Surgery) [name], [facility] as Pharmacist (Pharmacist) Jacelyn Pi, MD as Referring Physician (Endocrinology)  Date of Service:  06/28/2022  CHIEF COMPLAINT: f/u of metastatic breast cancer  CURRENT THERAPY:  Halaven, days 1+8 q21d, starting 06/07/22 -Zometa, q69month, starting 11/20/20 -palliative radiation to left hip, 06/24/22 - 07/16/22  ASSESSMENT:  Kristin Rilesis a 66y.o. female with   1. Left breast invasive ductal carcinoma, stage IB, p(T1cN0M0), Triple negative, Grade 3, in 2018, metastatic disease in 08/2020 ER 20% weakly + PR/HER2 (0) negative, PD-L1 0%, genetics (-), HER2 1+ on node biopsy  -diagnosed with triple negative breast cancer in 05/2017. S/p left breast lumpectomy, adjuvant chemo TC and Radiation.  -biopsy of right gluteus on 09/02/20 due to new pain showed metastatic carcinoma, consistent with breast primary. ER 20% weakly positive, PR and HER2 negative.  -s/p 4 cycles of Taxol 09/26/20 - 12/18/20, discontinued due to worsening neuropathy. She progressed through Xeloda 01/01/21 - 06/2021 and Kristin Loyal11/22/22 - 01/11/22. Kristin Ward uncontrolled hyperglycemia.  -biopsy of a right inguinal lymph node on 01/15/22 showed adenocarcinoma, consistent with metastatic breast carcinoma. Prognostic panel confirmed triple negative disease (Her2 1+ low expression).  -s/p palliative radiation to right inguinal nodes under Dr. MLisbeth Renshaw6/19/23 - 02/08/22.  -she switched to Enhertu on 02/15/22 due to progression and uncontrolled hyperglycemia from Kristin Ward and she had significant  side effects. She tolerated cycle 2 much better with added IV emend. -restaging CT CAP 05/28/22 showed mixed response-- improvement to some retroperitoneal and right groin nodes, but worsening of other retroperitoneal, left pelvic sidewall, and left groin nodes. Right iliac bone lesion is stable. -[place] 06[place]. She tolerated well overall with mild fatigue and oral thrush. -labs reviewed, hgb stable at 10.1, ANC down to 1.2. We discussed possibly adding GCF-S after she finishes radiation if her counts remain low.   2.  Left inguinal pain and tenderness -developed in early 04/2022, reports the pain is mainly in groin but expands to left hip and down her inner thigh. -she is very tender to palpation on exam 04/19/22. STAT doppler showed no DVT and (+) enlarged nodes, pain is probably related to adenopathy  -she is currently receiving palliative radiation to her left hip under Dr. MLisbeth Renshaw scheduled to finish on 07/16/22.   3. Chronic combined systolic and diastolic heart failure -due to concerns for reversible cardiotoxicity with Enhertu, she was evaluated by Dr. BHaroldine Laws Echo on 01/31/22 showed EF 35-40%, GLS -12.9%, RV mildly reduced.  -repeat echo 04/01/22 showed stability to slight improvement in EF to ~40% per Dr. BHaroldine Laws -she is scheduled for repeat echo on 07/01/22.   4. Bone metastases, Vit D deficiency -She developed radiating right hip pain in 2020 from right iliac wing mass. S/p palliative radiation to right iliac 09/04/20 - 09/22/20 with Dr. MLisbeth Renshaw -Given localized bone met, she started Zometa q372monthon 11/20/20. Most recent dose 05/10/22   5. G2 peripheral Neuropathy  -S/p C2D8 Taxol she developed mild tingling in her hands and feet. Taxol stopped after 12/18/20.  -Overall stable now     PLAN: -proceed with C2D1 Halaven  today -continue palliative radiation to left groin/hip. -lab, flush, and Halaven in 1 and 4, 5 weeks, postpone next cycle C3 chemo for a week to  allow her recover better from RT  -f/u in 4 weeks   SUMMARY OF ONCOLOGIC HISTORY: Oncology History Overview Note  Cancer Staging Malignant neoplasm of lower-outer quadrant of left breast of female, estrogen receptor negative ([place]) Staging form: Breast, AJCC 8th Edition - Clinical stage from 06/06/2017: Stage IB (cT1c, cN0, cM0, G3, ER-, PR-, HER2-) - Signed by [name], MD on 06/15/2017 Nuclear grade: G3 Histologic grading system: 3 grade system - Pathologic stage from 06/26/2017: Stage IB (pT1c, pN0, cM0, G3, ER-, PR-, HER2-) - Signed by [name], MD on 07/10/2017 Neoadjuvant therapy: No Nuclear grade: G3 Multigene prognostic tests performed: None Histologic grading system: 3 grade system Laterality: Left     Malignant neoplasm of lower-outer quadrant of left breast of female, estrogen receptor negative ([place])  05/30/2017 Mammogram   Diagnostic mammo and US IMPRESSION: 1. Highly suspicious 1.4 cm mass in the slightly lower slightly outer left breast -tissue sampling recommended. 2. Indeterminate 0.5 mm mass in the slightly lower slightly outer left breast -tissue sampling recommended. 3. At least 2 left axillary lymph nodes with borderline cortical thickness.   06/06/2017 Initial Biopsy   Diagnosis 1. Breast, left, needle core biopsy, 5:30 o'clock - INVASIVE DUCTAL CARCINOMA, G3 2. Lymph node, needle/core biopsy, left axillary - NO CARCINOMA IDENTIFIED IN ONE LYMPH NODE (0/1)   06/06/2017 Initial Diagnosis   Malignant neoplasm of lower-outer quadrant of left breast of female, estrogen receptor negative ([place])   06/06/2017 Receptors her2   Estrogen Receptor: 0%, NEGATIVE Progesterone Receptor: 0%, NEGATIVE Proliferation Marker Ki67: 70%   06/26/2017 Surgery   LEFT BREAST LUMPECTOMY WITH RADIOACTIVE SEED AND SENTINEL LYMPH NODE BIOPSY ERAS  PATHWAY AND INSERTION PORT-A-CATH By Dr. Barry Dienes on 06/26/17    06/26/2017 Pathology Results   Diagnosis 06/26/17  1. Breast,  lumpectomy, Left - INVASIVE DUCTAL CARCINOMA, GRADE III/III, SPANNING 1.2 CM. - THE SURGICAL RESECTION MARGINS ARE NEGATIVE FOR CARCINOMA. - SEE ONCOLOGY TABLE BELOW. 2. Lymph node, sentinel, biopsy, Left axillary #1 - THERE IS NO EVIDENCE OF CARCINOMA IN 1 OF 1 LYMPH NODE (0/1). 3. Lymph node, sentinel, biopsy, Left axillary #2 - THERE IS NO EVIDENCE OF CARCINOMA IN 1 OF 1 LYMPH NODE (0/1). 4. Lymph node, sentinel, biopsy, Left axillary #3 - THERE IS NO EVIDENCE OF CARCINOMA IN 1 OF 1 LYMPH NODE (0/1).    07/25/2017 - 09/29/2017 Chemotherapy   Adjuvant cytoxan and docetaxel (TC) every 3 weekd for 4 cycles     11/19/2017 - 12/17/2017 Radiation Therapy   Adjuvant breast radiation Left breast treated to 42.5 Gy with 17 fx of 2.5 Gy followed by a boost of 7.5 Gy with 3 fx of 2.5 Gy      02/2019 Procedure   She had PAC removal in 02/2019.    07/23/2020 Imaging   MRI Lumbar Spine 07/23/20 IMPRESSION: 1. No significant disc herniation, spinal canal or neural foraminal stenosis at any level. 2. Moderate facet degenerative changes at L3-4, L4-5 and L5-S1. 3. Multiple enlarged retroperitoneal and right iliac lymph nodes. Recommend correlation with CT of the abdomen and pelvis with contrast.   08/15/2020 Imaging   CT AP 08/15/20  IMPRESSION: 1. Right iliac and periaortic adenopathy is noted concerning for metastatic disease or lymphoma. Also noted is abnormal soft tissue mass anterior and posterior to the right iliac wing concerning for malignancy or  metastatic disease. MRI is recommended for further evaluation. 2. Irregular lucency with sclerotic margins is seen involving the right iliac crest concerning for possible lytic lesion. MRI may be performed for further evaluation. 3. Enlarged fibroid uterus. 4. Small gallstone. 5. Aortic atherosclerosis.   08/22/2020 Pathology Results   FINAL MICROSCOPIC DIAGNOSIS: 08/22/20  A. NEEDLE CORE, RIGHT GLUTEUS MINIMUMUS, BIOPSY:  -  Metastatic  carcinoma  -  See comment   COMMENT:  By immunohistochemistry, the neoplastic cells are positive for  cytokeratin-7 and have weak patchy positivity for GATA3 but are negative for cytokeratin-20 and GCDFP.  The combined morphology and immunophenotype, support metastasis from the patient's known breast primary carcinoma.   ADDENDUM:  PROGNOSTIC INDICATOR RESULTS:  The tumor cells are NEGATIVE for Her2 (0).  Estrogen Receptor:       POSITIVE, 20%, WEAK STAINING  Progesterone Receptor:   NEGATIVE   PD-L1 (0) negative    09/04/2020 - 09/22/2020 Radiation Therapy   Palliative radiation to right iliac 09/04/20 - 09/22/20, Dr. Lisbeth Renshaw   09/26/2020 -  Chemotherapy   first line weekly Taxol 3 weeks on/1 week off starting 09/26/20 --Reduced to 2 weeks on/1 week off starting with C3 due to neuropathy. Stopped after C4 on 12/18/20 due to neuratrophy.  --Switched to Xeloda on 01/01/21 at 1518m in the AM and 20033min the PM for 2 weeks on/1 week off     10/13/2020 Procedure    PAC placement on 10/13/20.    04/10/2021 Imaging   CT CAP  IMPRESSION: 1. Enlarging abdominopelvic retroperitoneal and right inguinal adenopathy, compatible with disease progression. 2. Lytic metastasis in the right iliac wing, similar. 3. Hepatomegaly. 4. Cholelithiasis. 5. Enlarged fibroid uterus. 6.  Aortic atherosclerosis (ICD10-I70.0).   06/18/2021 Imaging   CT CAP  IMPRESSION: 1. Today's study demonstrates progression of metastatic disease as evidence by increased number and size of numerous enlarged lymph nodes in the retroperitoneum, along the right pelvic sidewall, and in the upper right thigh. 2. Osseous metastases in the bony pelvis appear similar to the prior examination. No definite new osseous lesions are otherwise noted. 3. Fibroid uterus again noted. 4. Cholelithiasis without evidence of acute cholecystitis. 5. Aortic atherosclerosis, in addition to left anterior descending coronary artery disease. Please note  that although the presence of coronary artery calcium documents the presence of coronary artery disease, the severity of this disease and any potential stenosis cannot be assessed on this non-gated CT examination. Assessment for potential risk factor modification, dietary therapy or pharmacologic therapy may be warranted, if clinically indicated. 6. Additional incidental findings, as above.   06/20/2021 Imaging   Bone Scan  IMPRESSION: No osseous uptake suspicious for recurrent/progressive metastatic disease.   07/03/2021 - 01/11/2022 Chemotherapy   Patient is on Treatment Plan : BREAST METASTATIC Sacituzumab govitecan-hziy (Kristin Loyalq21d     09/27/2021 Imaging   EXAM: CT CHEST, ABDOMEN, AND PELVIS WITH CONTRAST  IMPRESSION: 1. Marked interval improvement in previously demonstrated lymphadenopathy in the abdomen and pelvis. A few prominent right inguinal lymph nodes remain. 2. Stable osseous metastatic disease in the pelvis. 3. No evidence of local recurrence or other metastatic disease. 4. Stable incidental findings including cholelithiasis, uterine fibroids and Aortic Atherosclerosis (ICD10-I70.0).  ADDENDUM: Not mentioned in the original impression is a possible nonocclusive fibrin sheath along the distal aspect of the Port-A-Cath.   09/27/2021 Imaging   EXAM: NUCLEAR MEDICINE WHOLE BODY BONE SCAN  IMPRESSION: Stable examination, without abnormal osseous uptake.   11/27/2021 Imaging   CT AP  IMPRESSION: 1. Stable lytic and sclerotic lesion in the RIGHT iliac wing. 2. No evidence of visceral metastasis or adenopathy in the abdomenpelvis. 3. Leiomyomatous uterus.  ADDENDUM REPORT: 11/29/2021 09:33 Upon further review and discussion with Cira Rue NP, the RIGHT inguinal lymph nodes previous described have increased in volume. For example 14 mm node (image 91/2) is increased from 9 mm. Adjacent 14 mm node on image 86 is increased from 8 mm. Additionally, LEFT common iliac  lymph node is also slightly increased measuring 10 mm (46/2) compared to 7 mm on comparison exam from 09/27/2021. These sites demonstrates similar but greater lymphadenopathy on CT 06/18/2021.   02/15/2022 - 03/08/2022 Chemotherapy   Patient is on Treatment Plan : BREAST METASTATIC fam-trastuzumab deruxtecan-nxki (Enhertu) q21d     02/15/2022 - 05/10/2022 Chemotherapy   Patient is on Treatment Plan : BREAST METASTATIC Fam-Trastuzumab Deruxtecan-nxki (Enhertu) (5.4) q21d     06/07/2022 -  Chemotherapy   Patient is on Treatment Plan : BREAST METASTATIC Eribulin D1,8 q21d     06/28/2022 -  Chemotherapy   Patient is on Treatment Plan : HEAD/NECK Pembrolizumab (200) q21d        INTERVAL HISTORY:  Kristin Ward is here for a follow up of metastatic breast cancer. She was last seen by me on 06/07/22. She presents to the clinic alone. She reports she is doing well with radiation and notes improving ROM in her hip. She also reports fatigue from radiation. She reports she developed thrush after her last cycle chemo. She endorses using magic mouthwash for relief. She reports persistent swelling in her right leg from her enlarged lymph nodes.   All other systems were reviewed with the patient and are negative.  MEDICAL HISTORY:  Past Medical History:  Diagnosis Date   Abnormal x-ray of lungs with single pulmonary nodule 03/15/2016   per records from [place]    Anemia    Asthma    Cholelithiasis 05/19/2017   On CT   Coronary artery calcification seen on CAT scan 05/19/2017   Diabetes mellitus without complication ([place])    type 2   Diabetes mellitus, new onset ([place]) 01/20/2019   Dilated idiopathic cardiomyopathy ([place]) 12/2015   EF 15-20%. Diagnosed in [facility]   Headache    NONE RECENT   Hypertension    Lymph nodes enlarged 07/25/2020   Malignant neoplasm of lower-outer quadrant of left breast of female, estrogen receptor negative ([place]) 06/11/2017   left breast    Mixed hyperlipidemia 06/17/2018   partially blind    Personal history of chemotherapy 2018-2019   Personal history of radiation therapy 2019   Retinitis pigmentosa    Retroperitoneal lymphadenopathy 07/25/2020   Uveitis     SURGICAL HISTORY: Past Surgical History:  Procedure Laterality Date   brain cyst removed  2007   to help with headaches per patient , aspirated    BREAST BIOPSY Left 06/06/2017   x2   BREAST CYST ASPIRATION Left 06/06/2017   BREAST LUMPECTOMY Left 06/26/2017   BREAST LUMPECTOMY WITH RADIOACTIVE SEED AND SENTINEL LYMPH NODE BIOPSY Left 06/26/2017   Procedure: BREAST LUMPECTOMY WITH RADIOACTIVE SEED AND SENTINEL LYMPH NODE BIOPSY ERAS  PATHWAY;  Surgeon: [name], MD;  Location: [place];  Service: General;  Laterality: Left;  pec block   FINE NEEDLE ASPIRATION Left 02/23/2019   Procedure: FINE NEEDLE ASPIRATION;  Surgeon: [name], MD;  Location: [place];  Service: General;  Laterality: Left;  Asiration of left breast seroma  IR IMAGING GUIDED PORT INSERTION  10/13/2020   NASAL TURBINATE REDUCTION     PORT-A-CATH REMOVAL Right 02/23/2019   Procedure: REMOVAL PORT-A-CATH;  Surgeon: [name], MD;  Location: [place];  Service: General;  Laterality: Right;   PORTACATH PLACEMENT N/A 06/26/2017   Procedure: INSERTION PORT-A-CATH;  Surgeon: [name], MD;  Location: [place] [facility];  Service: General;  Laterality: N/A;   TRANSTHORACIC ECHOCARDIOGRAM  12/2015   A) [facility] May 2017: Mild concentric LVH. Global hypokinesis. GR daily. EF 18% severe LA dilation. Mitral annular dilatation with papillary muscle dysfunction and moderate MR. Dilated IVC consistent with elevated RAP.   TRANSTHORACIC ECHOCARDIOGRAM  05/2017; 08/2017   a) Mild concentric LVH.  EF 45 to 50% with diffuse hypokinesis.  GR 1 DD.  Mild aortic root dilation.;; b)  Mild LVH EF 45 to 50%.  Diffuse HK.  No significant valve disease.  No significant change.    TRANSTHORACIC ECHOCARDIOGRAM  01/30/2022   Pre-Enhertu:EF 35 to 40%.  Moderate dysfunction with global HK.  Abnormal strain.  Normal RV size and function.  Normal RVSP.  Aortic root estimated 42 mm.  Mild MR.  Mild/trivial AI.  Normal RVP.;  04/01/2022: Preliminary read 40%.  Formal read 45 to 50% (probably 40 to 45%).  Global HK.  GR 1 DD.  Normal RV size and RVP.  Mild MR.  Now measured at 38 mm.  Normal RAP.    I have reviewed the social history and family history with the patient and they are unchanged from previous note.  ALLERGIES:  is allergic to morphine and related, latex, tylenol with codeine #3 [acetaminophen-codeine], and penicillins.  MEDICATIONS:  Current Outpatient Medications  Medication Sig Dispense Refill   Accu-Chek FastClix Lancets MISC TEST TWICE A DAY. PT USES AN ACCU-CHEK GUIDE ME METER 102 each 2   amLODipine (NORVASC) 5 MG tablet Take 1 tablet (5 mg total) by mouth daily. (Patient not taking: Reported on 04/29/2022) 30 tablet 10   Ascorbic Acid (VITAMIN C) 500 MG CAPS as directed Orally     aspirin 81 MG chewable tablet 1 tablet Orally Once a day     atorvastatin (LIPITOR) 40 MG tablet Take 1 tablet (40 mg total) by mouth daily. 90 tablet 3   brimonidine (ALPHAGAN) 0.2 % ophthalmic solution Place 1 drop into both eyes 2 (two) times daily.     carvedilol (COREG) 12.5 MG tablet TAKE 1 AND 1/2 TABLETS BY MOUTH TWICE DAILY qm 90 tablet 10   cholecalciferol (VITAMIN D3) 25 MCG (1000 UNIT) tablet Take 2,000 Units by mouth daily.     Continuous Blood Gluc Receiver (DEXCOM G7 RECEIVER) DEVI 1 Device by Does not apply route 2 (two) times daily. (Patient not taking: Reported on 04/29/2022) 1 each 1   Continuous Blood Gluc Sensor (DEXCOM G7 SENSOR) MISC Use as directed, replace every 10 DAYS (Patient not taking: Reported on 04/29/2022) 3 each 10   furosemide (LASIX) 20 MG tablet TAKE 2 TABLETS (40 MG TOTAL) BY MOUTH AS NEEDED FOR WEIGHT GAIN 3 TO 4 LBS. 60 tablet 10   HUMALOG  KWIKPEN 100 UNIT/ML KwikPen Inject 3-10 Units into the skin 3 (three) times daily before meals. Per sliding scale     HYDROcodone-acetaminophen (NORCO/VICODIN) 5-325 MG tablet Take 1 tablet by mouth every 6 (six) hours as needed for moderate pain. 30 tablet 0   ibuprofen (ADVIL) 800 MG tablet Take 1 tablet (800 mg total) by mouth every 8 (eight) hours as needed. [place]  tablet 2   Lancets Misc. (ACCU-CHEK SOFTCLIX LANCET DEV) KIT 1 Units by Does not apply route 2 (two) times daily. Check FSBS BID - Include strips # 50 with 5 refills, Lancets #50 with 5 refills DX: Type 2 DM - ICD 10: E11.9 1 kit 0   lidocaine-prilocaine (EMLA) cream Apply 1 application topically as needed. (Patient not taking: Reported on 04/29/2022) 30 g 0   linaclotide (LINZESS) 72 MCG capsule Take 72 mcg by mouth as needed.     magic mouthwash (nystatin, lidocaine, diphenhydrAMINE, alum & mag hydroxide) suspension Swish and swallow 5 mLs by mouth 4 (four) times daily as needed for mouth pain 140 mL 0   Multiple Vitamins-Minerals (WOMENS MULTI PO) Take 15 mLs by mouth daily.     ondansetron (ZOFRAN) 8 MG tablet Take 1 tablet (8 mg total) by mouth every 8 (eight) hours as needed for nausea or vomiting (begin on day 3 after chemo). 30 tablet 1   oxyCODONE (OXY IR/ROXICODONE) 5 MG immediate release tablet Take 1 tablet (5 mg total) by mouth every 8 (eight) hours as needed for severe pain. (Patient not taking: Reported on 04/29/2022) 60 tablet 0   pantoprazole (PROTONIX) 40 MG tablet TAKE 1 TABLET BY MOUTH EVERY DAY 90 tablet 1   prochlorperazine (COMPAZINE) 10 MG tablet Take 1 tablet (10 mg total) by mouth every 6 (six) hours as needed (Nausea or vomiting). 30 tablet 1   promethazine (PHENERGAN) 25 MG tablet Take 1 tablet (25 mg total) by mouth every 6 (six) hours as needed for nausea or vomiting. 30 tablet 0   sacubitril-valsartan (ENTRESTO) 97-103 MG Take 1 tablet by mouth 2 (two) times daily. 60 tablet 10   spironolactone (ALDACTONE) 25 MG  tablet Take 1 tablet (25 mg total) by mouth daily. 30 tablet 10   vitamin B-12 (CYANOCOBALAMIN) 1000 MCG tablet Take 1,000 mcg by mouth daily.     zinc gluconate 50 MG tablet Take 50 mg by mouth daily.     Current Facility-Administered Medications  Medication Dose Route Frequency Provider Last Rate Last Admin   insulin glulisine (APIDRA) injection 6 Units  6 Units Subcutaneous Once Williams, Lynne B, PA-C       Facility-Administered Medications Ordered in Other Visits  Medication Dose Route Frequency Provider Last Rate Last Admin   sodium chloride flush (NS) 0.9 % injection 10 mL  10 mL Intracatheter PRN [name], MD   10 mL at 06/28/22 1124    PHYSICAL EXAMINATION: ECOG PERFORMANCE STATUS: 1 - Symptomatic but completely ambulatory  Vitals:   06/28/22 0916  BP: (!) 154/99  Pulse: 93  Resp: 18  Temp: 97.6 F (36.4 C)  SpO2: 100%   Wt Readings from Last 3 Encounters:  06/28/22 224 lb (101.6 kg)  06/14/22 218 lb 8 oz (99.1 kg)  06/13/22 220 lb 6.4 oz (100 kg)     GENERAL:alert, no distress and comfortable SKIN: skin color normal, no rashes or significant lesions EYES: normal, Conjunctiva are pink and non-injected, sclera clear  NEURO: alert & oriented x 3 with fluent speech  LABORATORY DATA:  I have reviewed the data as listed    Latest Ref Rng & Units 06/28/2022    8:59 AM 06/14/2022    8:51 AM 06/07/2022    9:11 AM  CBC  WBC 4.0 - 10.5 K/uL 2.5  3.0  6.6   Hemoglobin 12.0 - 15.0 g/dL 10.1  10.1  10.7   Hematocrit 36.0 - 46.0 % 30.8  30.4  32.0   Platelets 150 - 400 K/uL 309  312  342         Latest Ref Rng & Units 06/28/2022    8:59 AM 06/14/2022    8:51 AM 06/07/2022    9:11 AM  CMP  Glucose 70 - 99 mg/dL 108  128  89   BUN 8 - 23 mg/dL _0 Creatinine 0.44 - 1.00 mg/dL 0.52  0.63  0.78   Sodium 135 - 145 mmol/L 142  143  139   Potassium 3.5 - 5.1 mmol/L 4.4  3.5  3.9   Chloride 98 - 111 mmol/L 111  109  106   CO2 22 - 32 mmol/L _1 Calcium 8.9 - 10.3 mg/dL 8.7  9.4  9.4   Total Protein 6.5 - 8.1 g/dL 7.3  7.6  7.9   Total Bilirubin 0.3 - 1.2 mg/dL 0.5  0.5  0.5   Alkaline Phos 38 - 126 U/L 84  97  117   AST 15 - 41 U/L 25  41  31   ALT 0 - 44 U/L _2 RADIOGRAPHIC STUDIES: I have personally reviewed the radiological images as listed and agreed with the findings in the report. No results found.    Orders Placed This Encounter  Procedures   CBC with Differential ([place] Only)    Standing Status:   Future    Standing Expiration Date:   07/27/2023   CMP ([facility] only)    Standing Status:   Future    Standing Expiration Date:   07/27/2023   Magnesium    Standing Status:   Future    Standing Expiration Date:   07/27/2023   CBC with Differential ([facility] Only)    Standing Status:   Future    Standing Expiration Date:   08/03/2023   CMP ([place] only)    Standing Status:   Future    Standing Expiration Date:   08/03/2023   Magnesium    Standing Status:   Future    Standing Expiration Date:   08/03/2023   All questions were answered. The patient knows to call the clinic with any problems, questions or concerns. No barriers to learning was detected. The total time spent in the appointment was 30 minutes.     [name], MD 06/28/2022   I, Wilburn Mylar, am acting as scribe for [name], MD.   I have reviewed the above documentation for accuracy and completeness, and I agree with the above.

## 2022-06-29 LAB — CANCER ANTIGEN 27.29: CA 27.29: 132.3 U/mL — ABNORMAL HIGH (ref 0.0–38.6)

## 2022-07-01 ENCOUNTER — Other Ambulatory Visit (HOSPITAL_COMMUNITY): Payer: Self-pay

## 2022-07-01 ENCOUNTER — Telehealth: Payer: Self-pay | Admitting: Hematology

## 2022-07-01 ENCOUNTER — Ambulatory Visit
Admission: RE | Admit: 2022-07-01 | Discharge: 2022-07-01 | Disposition: A | Discharge status: Home / Self Care | Payer: Medicare Other | Source: Ambulatory Visit | Attending: Radiation Oncology | Admitting: Radiation Oncology

## 2022-07-01 ENCOUNTER — Encounter (HOSPITAL_COMMUNITY): Payer: Self-pay | Admitting: Internal Medicine

## 2022-07-01 ENCOUNTER — Ambulatory Visit (HOSPITAL_BASED_OUTPATIENT_CLINIC_OR_DEPARTMENT_OTHER)
Admission: RE | Admit: 2022-07-01 | Discharge: 2022-07-01 | Disposition: A | Discharge status: Home / Self Care | Payer: Medicare Other | Source: Ambulatory Visit | Attending: Internal Medicine | Admitting: Internal Medicine

## 2022-07-01 ENCOUNTER — Other Ambulatory Visit: Payer: Self-pay

## 2022-07-01 ENCOUNTER — Ambulatory Visit (HOSPITAL_COMMUNITY)
Admission: RE | Admit: 2022-07-01 | Discharge: 2022-07-01 | Disposition: A | Discharge status: Home / Self Care | Payer: Medicare Other | Source: Ambulatory Visit | Attending: Internal Medicine | Admitting: Internal Medicine

## 2022-07-01 VITALS — BP 110/70 | HR 90 | Wt 221.4 lb

## 2022-07-01 DIAGNOSIS — E119 Type 2 diabetes mellitus without complications: Secondary | ICD-10-CM | POA: Diagnosis not present

## 2022-07-01 DIAGNOSIS — Z9221 Personal history of antineoplastic chemotherapy: Secondary | ICD-10-CM | POA: Diagnosis not present

## 2022-07-01 DIAGNOSIS — R600 Localized edema: Secondary | ICD-10-CM | POA: Insufficient documentation

## 2022-07-01 DIAGNOSIS — E785 Hyperlipidemia, unspecified: Secondary | ICD-10-CM | POA: Insufficient documentation

## 2022-07-01 DIAGNOSIS — I10 Essential (primary) hypertension: Secondary | ICD-10-CM

## 2022-07-01 DIAGNOSIS — H547 Unspecified visual loss: Secondary | ICD-10-CM | POA: Insufficient documentation

## 2022-07-01 DIAGNOSIS — I428 Other cardiomyopathies: Secondary | ICD-10-CM | POA: Diagnosis not present

## 2022-07-01 DIAGNOSIS — C50512 Malignant neoplasm of lower-outer quadrant of left female breast: Secondary | ICD-10-CM | POA: Diagnosis not present

## 2022-07-01 DIAGNOSIS — C50912 Malignant neoplasm of unspecified site of left female breast: Secondary | ICD-10-CM | POA: Diagnosis not present

## 2022-07-01 DIAGNOSIS — Z510 Encounter for antineoplastic radiation therapy: Secondary | ICD-10-CM | POA: Diagnosis not present

## 2022-07-01 DIAGNOSIS — Z171 Estrogen receptor negative status [ER-]: Secondary | ICD-10-CM | POA: Diagnosis not present

## 2022-07-01 DIAGNOSIS — Z5111 Encounter for antineoplastic chemotherapy: Secondary | ICD-10-CM | POA: Diagnosis not present

## 2022-07-01 DIAGNOSIS — I110 Hypertensive heart disease with heart failure: Secondary | ICD-10-CM | POA: Insufficient documentation

## 2022-07-01 DIAGNOSIS — I340 Nonrheumatic mitral (valve) insufficiency: Secondary | ICD-10-CM | POA: Diagnosis not present

## 2022-07-01 DIAGNOSIS — Z79899 Other long term (current) drug therapy: Secondary | ICD-10-CM | POA: Diagnosis not present

## 2022-07-01 DIAGNOSIS — I5022 Chronic systolic (congestive) heart failure: Secondary | ICD-10-CM | POA: Insufficient documentation

## 2022-07-01 DIAGNOSIS — C774 Secondary and unspecified malignant neoplasm of inguinal and lower limb lymph nodes: Secondary | ICD-10-CM | POA: Diagnosis not present

## 2022-07-01 DIAGNOSIS — Z923 Personal history of irradiation: Secondary | ICD-10-CM | POA: Diagnosis not present

## 2022-07-01 DIAGNOSIS — C7951 Secondary malignant neoplasm of bone: Secondary | ICD-10-CM | POA: Diagnosis not present

## 2022-07-01 DIAGNOSIS — I5042 Chronic combined systolic (congestive) and diastolic (congestive) heart failure: Secondary | ICD-10-CM

## 2022-07-01 DIAGNOSIS — Z170 Estrogen receptor positive status [ER+]: Secondary | ICD-10-CM | POA: Diagnosis not present

## 2022-07-01 LAB — RAD ONC ARIA SESSION SUMMARY
Course Elapsed Days: 7
Plan Fractions Treated to Date: 6
Plan Prescribed Dose Per Fraction: 2.5 Gy
Plan Total Fractions Prescribed: 15
Plan Total Prescribed Dose: 37.5 Gy
Reference Point Dosage Given to Date: 15 Gy
Reference Point Session Dosage Given: 2.5 Gy
Session Number: 6

## 2022-07-01 LAB — ECHOCARDIOGRAM COMPLETE
Area-P 1/2: 4.89 cm2
Calc EF: 43.5 %
MV M vel: 4.88 m/s
MV Peak grad: 95.1 mmHg
Radius: 0.3 cm
S' Lateral: 4.1 cm
Single Plane A2C EF: 43.6 %
Single Plane A4C EF: 47.5 %

## 2022-07-01 MED ORDER — EMPAGLIFLOZIN 10 MG PO TABS: 10.0000 mg | ORAL_TABLET | Freq: Every day | ORAL | 3 refills | Status: DC

## 2022-07-01 NOTE — H&P (View-Only) (Signed)
Cardio-Oncology Clinic Consult Note   Referring Physician: Dr. Burr Medico  Primary Cardiologist: Dr, Ellyn Hack  HPI:  Nancee Brownrigg Hyams is a 66 y.o. 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 Burr Medico for f/u in the Cardio-oncology  Found to have cardiomyopathy in 2017 in [place] (prior to chemo). EF 18% did not have a cath.   In 2018 treated for breast cancer. Had left lumpectomy, adjuvant chemo with docetaxel and cytoxan +XRT.   Prior records limited. Diagnosed with HF in 2016 while in [place]. Later relocated to the area and was followed by [facility] for a while. Subsequently followed by Dr. Ellyn Hack, last visit in 2020. Ef eventually recovered to 45-50%.   In 1/22 found to have recurrent metastatic cancer. Treated with 4 cycles of Taxol -stopped due to Neuropathy. Switched to Xeloda. Switched to [place] d/t progression. Held 02/23 d/t uncontrolled hyperglycemia.CT CAP stable right iliac wing lesion, increased inguinal lymph nodes. Biopsy right inguinal lymph node consistent with metastatic breast carcinoma. Now getting palliative XRT. Now planning to switch to Enhertu.   Echo  04/01/22 EF ~ 40% (read as 45-50%) Personally reviewed  cMRI 9/23 EF 35-40% RVEF 43% Mod MR. No LGE  Here for f/u. Off Enhertu. Now on Halaven (microtubule inhibitor) and XRT. Feels ok. Mild SOB on activity. + LE edema R>L (RLE edema is chronic) No CP   Echo today 07/01/22 EF 40-45% Personally reviewed    Echo 12/2015 in [place]: EF 18%, RV reduced, moderate MR Echo 05/2017: EF 45-50% Echo 08/2017: EF 45-50%, mild LVH, RV okay Echo 01/31/22 (not on Enhertu) with EF 35-40%, GLS -12.9%, RV mildly reduced   Cancer Profile  Left breast invasive ductal carcinoma, triple negative    2018 Initial Diagnosis      S/p left breast lumpectomy, adjuvant chemo (docetaxel and cytoxan) and radiation     09/02/20 New right gluteal pain. Biopsy showed metastatic carcinoma, breast primary ER 20% positive, PR and HER2 negative    02/22-05/22 4 cycles Taxol - stopped d/t peripheral neuropathy   05/22-11/22 Took Xeloda   11/22-02/23 Switched to [place] d/t progression. Held 02/23 d/t uncontrolled hyperglycemia.   04/23 CT CAP stable right iliac wing lesion, increased inguinal lymph nodes   06/23 Biopsy right inguinal lymph node consistent with metastatic breast carcinoma       Past Medical History:  Diagnosis Date   Abnormal x-ray of lungs with single pulmonary nodule 03/15/2016   per records from [place]    Anemia    Asthma    Cholelithiasis 05/19/2017   On CT   Coronary artery calcification seen on CAT scan 05/19/2017   Diabetes mellitus without complication ([place])    type 2   Diabetes mellitus, new onset ([place]) 01/20/2019   Dilated idiopathic cardiomyopathy ([place]) 12/2015   EF 15-20%. Diagnosed in [facility]   Headache    NONE RECENT   Hypertension    Lymph nodes enlarged 07/25/2020   Malignant neoplasm of lower-outer quadrant of left breast of female, estrogen receptor negative ([place]) 06/11/2017   left breast   Mixed hyperlipidemia 06/17/2018   partially blind    Personal history of chemotherapy 2018-2019   Personal history of radiation therapy 2019   Retinitis pigmentosa    Retroperitoneal lymphadenopathy 07/25/2020   Uveitis     Current Outpatient Medications  Medication Sig Dispense Refill   Accu-Chek FastClix Lancets MISC TEST TWICE A DAY.  PT USES AN ACCU-CHEK GUIDE ME METER 102 each 2   amLODipine (NORVASC) 5 MG tablet Take 5 mg by mouth as needed. According to B/P     Ascorbic Acid (VITAMIN C) 500 MG CAPS as directed Orally     aspirin 81 MG chewable tablet 1 tablet Orally Once a day     atorvastatin (LIPITOR) 40 MG tablet Take 1 tablet (40 mg total) by mouth daily. 90 tablet 3   brimonidine (ALPHAGAN) 0.2 % ophthalmic solution Place 1 drop into  both eyes 2 (two) times daily.     carvedilol (COREG) 12.5 MG tablet TAKE 1 AND 1/2 TABLETS BY MOUTH TWICE DAILY qm 90 tablet 10   cholecalciferol (VITAMIN D3) 25 MCG (1000 UNIT) tablet Take 2,000 Units by mouth daily.     furosemide (LASIX) 20 MG tablet TAKE 2 TABLETS (40 MG TOTAL) BY MOUTH AS NEEDED FOR WEIGHT GAIN 3 TO 4 LBS. 60 tablet 10   HUMALOG KWIKPEN 100 UNIT/ML KwikPen Inject 3-10 Units into the skin 3 (three) times daily before meals. Per sliding scale     HYDROcodone-acetaminophen (NORCO/VICODIN) 5-325 MG tablet Take 1 tablet by mouth every 6 (six) hours as needed for moderate pain. 30 tablet 0   ibuprofen (ADVIL) 800 MG tablet Take 1 tablet (800 mg total) by mouth every 8 (eight) hours as needed. 30 tablet 2   Lancets Misc. (ACCU-CHEK SOFTCLIX LANCET DEV) KIT 1 Units by Does not apply route 2 (two) times daily. Check FSBS BID - Include strips # 50 with 5 refills, Lancets #50 with 5 refills DX: Type 2 DM - ICD 10: E11.9 1 kit 0   lidocaine-prilocaine (EMLA) cream Apply 1 application topically as needed. 30 g 0   linaclotide (LINZESS) 72 MCG capsule Take 72 mcg by mouth as needed.     magic mouthwash (nystatin, lidocaine, diphenhydrAMINE, alum & mag hydroxide) suspension Swish and swallow 5 mLs by mouth 4 (four) times daily as needed for mouth pain 140 mL 0   Multiple Vitamins-Minerals (WOMENS MULTI PO) Take 15 mLs by mouth daily.     ondansetron (ZOFRAN) 8 MG tablet Take 1 tablet (8 mg total) by mouth every 8 (eight) hours as needed for nausea or vomiting (begin on day 3 after chemo). 30 tablet 1   oxyCODONE (OXY IR/ROXICODONE) 5 MG immediate release tablet Take 1 tablet (5 mg total) by mouth every 8 (eight) hours as needed for severe pain. 60 tablet 0   pantoprazole (PROTONIX) 40 MG tablet TAKE 1 TABLET BY MOUTH EVERY DAY 90 tablet 1   prochlorperazine (COMPAZINE) 10 MG tablet Take 1 tablet (10 mg total) by mouth every 6 (six) hours as needed (Nausea or vomiting). 30 tablet 1    promethazine (PHENERGAN) 25 MG tablet Take 1 tablet (25 mg total) by mouth every 6 (six) hours as needed for nausea or vomiting. 30 tablet 0   sacubitril-valsartan (ENTRESTO) 97-103 MG Take 1 tablet by mouth 2 (two) times daily. 60 tablet 10   spironolactone (ALDACTONE) 25 MG tablet Take 1 tablet (25 mg total) by mouth daily. 30 tablet 10   vitamin B-12 (CYANOCOBALAMIN) 1000 MCG tablet Take 1,000 mcg by mouth daily.     zinc gluconate 50 MG tablet Take 50 mg by mouth daily.     Current Facility-Administered Medications  Medication Dose Route Frequency Provider Last Rate Last Admin   insulin glulisine (APIDRA) injection 6 Units  6 Units Subcutaneous Once Irene Pap, PA-C  Allergies  Allergen Reactions   Morphine And Related Nausea And Vomiting   Latex Hives and Itching    Burning    Tylenol With Codeine #3 [Acetaminophen-Codeine] Nausea And Vomiting   Penicillins Nausea Only, Swelling and Rash    Has patient had a PCN reaction causing immediate rash, facial/tongue/throat swelling, SOB or lightheadedness with hypotension: Yes Has patient had a PCN reaction causing severe rash involving mucus membranes or skin necrosis: Yes Has patient had a PCN reaction that required hospitalization: No Has patient had a PCN reaction occurring within the last 10 years: No TONGUE SWELLING AND RASH AROUND MOUTH If all of the above answers are "NO", then may proceed with Cephalosporin use.       Social History   Socioeconomic History   Marital status: Widowed    Spouse name: Not on file   Number of children: 2   Years of education: Not on file   Highest education level: Not on file  Occupational History   Not on file  Tobacco Use   Smoking status: Former    Packs/day: 0.25    Years: 23.00    Total pack years: 5.75    Types: Cigarettes    Quit date: 10/09/2015    Years since quitting: 6.7   Smokeless tobacco: Never  Vaping Use   Vaping Use: Never used  Substance and Sexual  Activity   Alcohol use: No   Drug use: No   Sexual activity: Never  Other Topics Concern   Not on file  Social History Narrative   She is a widowed mother of 2 (daughter and son).   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   She moved to [place] apparently back in 2016, but was visiting family in [place] and was diagnosed with Cardiomyopathy-EF 18%.   She is usually accompanied by her daughter.    At baseline, she walks roughly 1-2 miles a day.   Social Determinants of Health   Financial Resource Strain: Low Risk  (11/01/2021)   Overall Financial Resource Strain (CARDIA)    Difficulty of Paying Living Expenses: Not hard at all  Food Insecurity: No Food Insecurity (09/01/2020)   Hunger Vital Sign    Worried About Running Out of Food in the Last Year: Never true    Ran Out of Food in the Last Year: Never true  Transportation Needs: No Transportation Needs (11/01/2021)   PRAPARE - Hydrologist (Medical): No    Lack of Transportation (Non-Medical): No  Physical Activity: Not on file  Stress: Stress Concern Present (09/01/2020)   [place]    Feeling of Stress : Rather much  Social Connections: Unknown (09/01/2020)   Social Connection and Isolation Panel [NHANES]    Frequency of Communication with Friends and Family: More than three times a week    Frequency of Social Gatherings with Friends and Family: More than three times a week    Attends Religious Services: More than 4 times per year    Active Member of [employer] or Organizations: No    Attends Archivist Meetings: Never    Marital Status: Not on file  Intimate Partner Violence: Not on file      Family History  Problem Relation Age of Onset   Colon cancer Mother 39       deceased 59   Heart attack Father    Breast cancer Other 75  2nd cousin on maternal side; currently 51   Pancreatic cancer Other        2nd  cousin once removed on maternal side; deceased 57s   Fibromyalgia Sister    Esophageal cancer Neg Hx    Rectal cancer Neg Hx    Stomach cancer Neg Hx     Vitals:   07/01/22 1049  BP: 110/70  Pulse: 90  SpO2: 99%  Weight: 100.4 kg (221 lb 6.4 oz)    PHYSICAL EXAM: General:  Well appearing. No resp difficulty HEENT: normal Neck: supple. no JVD. Carotids 2+ bilat; no bruits. No lymphadenopathy or thryomegaly appreciated. Cor: PMI nondisplaced. Regular rate & rhythm. No rubs, gallops or murmurs. Lungs: clear Abdomen: soft, nontender, nondistended. No hepatosplenomegaly. No bruits or masses. Good bowel sounds. Extremities: no cyanosis, clubbing, rash, 2-3+ edema R>L  Neuro: alert & orientedx3, cranial nerves grossly intact. moves all 4 extremities w/o difficulty. Affect pleasant   ASSESSMENT & PLAN:  1. Metastatic left breast cancer - agree with plan for Enhertu - discussed small risk of reversible cardiotoxicty with Enhertu but benefit seems to clearly outweigh the risks - EF stable on echo today - Off Enhertu. Now on Halaven (microtubule inhibitor) & XRT - Follow echos closely.   2. Chronic systolic CHF/?NICM: -Diagnosed in 2016. Unclear etiology -Echo 12/2015 in [place]: EF 18%, RV reduced, moderate MR -Echo 05/2017: EF 45-50% -Echo 08/2017: EF 45-50%, mild LVH, RV okay -Echo 01/31/22 (not on Enhertu) with EF 35-40%, GLS -12.9%, RV mildly reduced - Echo 04/01/22 EF ~ 40% (read as 45-50%)  - cMRI 9/23 EF 35-40% RVEF 43% Mod MR. No LGE - Echo today 07/01/22 EF 40-45% Personally reviewed - NYHA II. Volume up. Take lasix. Place TED hose - Continue carvedilol 18.75 bid - Continue Entresto 97/103 bid - Continue Spiro 25 - Consider SGLT2i soon but won't start with recent dehyrdation episode - EF not improving with GDMT. Plan cath to exclude underlying CAD as cause of cardiomyopathy (once XRT completed) - Recent bloodwork reviewed and ok  3. RLE edema - Place TED hose. Take  lasix prn   4. HTN - BP well controlled at home.      Glori Bickers, MD  11:26 AM

## 2022-07-01 NOTE — Addendum Note (Signed)
Encounter addended by: Jerl Mina, RN on: 07/01/2022 11:50 AM  Actions taken: Pharmacy for encounter modified, Order list changed

## 2022-07-01 NOTE — Addendum Note (Signed)
Encounter addended by: Jerl Mina, RN on: 07/01/2022 11:49 AM  Actions taken: Clinical Note Signed

## 2022-07-01 NOTE — Progress Notes (Signed)
Cardio-Oncology Clinic Consult Note   Referring Physician: Dr. Burr Medico  Primary Cardiologist: Dr, Ellyn Hack  HPI:  Kristin Ward is a 66 y.o. 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 Burr Medico for f/u in the Cardio-oncology  Found to have cardiomyopathy in 2017 in [place] (prior to chemo). EF 18% did not have a cath.   In 2018 treated for breast cancer. Had left lumpectomy, adjuvant chemo with docetaxel and cytoxan +XRT.   Prior records limited. Diagnosed with HF in 2016 while in [place]. Later relocated to the area and was followed by [facility] for a while. Subsequently followed by Dr. Ellyn Hack, last visit in 2020. Ef eventually recovered to 45-50%.   In 1/22 found to have recurrent metastatic cancer. Treated with 4 cycles of Taxol -stopped due to Neuropathy. Switched to Xeloda. Switched to [place] d/t progression. Held 02/23 d/t uncontrolled hyperglycemia.CT CAP stable right iliac wing lesion, increased inguinal lymph nodes. Biopsy right inguinal lymph node consistent with metastatic breast carcinoma. Now getting palliative XRT. Now planning to switch to Enhertu.   Echo  04/01/22 EF ~ 40% (read as 45-50%) Personally reviewed  cMRI 9/23 EF 35-40% RVEF 43% Mod MR. No LGE  Here for f/u. Off Enhertu. Now on Halaven (microtubule inhibitor) and XRT. Feels ok. Mild SOB on activity. + LE edema R>L (RLE edema is chronic) No CP   Echo today 07/01/22 EF 40-45% Personally reviewed    Echo 12/2015 in [place]: EF 18%, RV reduced, moderate MR Echo 05/2017: EF 45-50% Echo 08/2017: EF 45-50%, mild LVH, RV okay Echo 01/31/22 (not on Enhertu) with EF 35-40%, GLS -12.9%, RV mildly reduced   Cancer Profile  Left breast invasive ductal carcinoma, triple negative    2018 Initial Diagnosis      S/p left breast lumpectomy, adjuvant chemo (docetaxel and cytoxan) and radiation     09/02/20 New right gluteal pain. Biopsy showed metastatic carcinoma, breast primary ER 20% positive, PR and HER2 negative    02/22-05/22 4 cycles Taxol - stopped d/t peripheral neuropathy   05/22-11/22 Took Xeloda   11/22-02/23 Switched to [place] d/t progression. Held 02/23 d/t uncontrolled hyperglycemia.   04/23 CT CAP stable right iliac wing lesion, increased inguinal lymph nodes   06/23 Biopsy right inguinal lymph node consistent with metastatic breast carcinoma       Past Medical History:  Diagnosis Date   Abnormal x-ray of lungs with single pulmonary nodule 03/15/2016   per records from [place]    Anemia    Asthma    Cholelithiasis 05/19/2017   On CT   Coronary artery calcification seen on CAT scan 05/19/2017   Diabetes mellitus without complication ([place])    type 2   Diabetes mellitus, new onset ([place]) 01/20/2019   Dilated idiopathic cardiomyopathy ([place]) 12/2015   EF 15-20%. Diagnosed in [facility]   Headache    NONE RECENT   Hypertension    Lymph nodes enlarged 07/25/2020   Malignant neoplasm of lower-outer quadrant of left breast of female, estrogen receptor negative ([place]) 06/11/2017   left breast   Mixed hyperlipidemia 06/17/2018   partially blind    Personal history of chemotherapy 2018-2019   Personal history of radiation therapy 2019   Retinitis pigmentosa    Retroperitoneal lymphadenopathy 07/25/2020   Uveitis     Current Outpatient Medications  Medication Sig Dispense Refill   Accu-Chek FastClix Lancets MISC TEST TWICE A DAY.  PT USES AN ACCU-CHEK GUIDE ME METER 102 each 2   amLODipine (NORVASC) 5 MG tablet Take 5 mg by mouth as needed. According to B/P     Ascorbic Acid (VITAMIN C) 500 MG CAPS as directed Orally     aspirin 81 MG chewable tablet 1 tablet Orally Once a day     atorvastatin (LIPITOR) 40 MG tablet Take 1 tablet (40 mg total) by mouth daily. 90 tablet 3   brimonidine (ALPHAGAN) 0.2 % ophthalmic solution Place 1 drop into  both eyes 2 (two) times daily.     carvedilol (COREG) 12.5 MG tablet TAKE 1 AND 1/2 TABLETS BY MOUTH TWICE DAILY qm 90 tablet 10   cholecalciferol (VITAMIN D3) 25 MCG (1000 UNIT) tablet Take 2,000 Units by mouth daily.     furosemide (LASIX) 20 MG tablet TAKE 2 TABLETS (40 MG TOTAL) BY MOUTH AS NEEDED FOR WEIGHT GAIN 3 TO 4 LBS. 60 tablet 10   HUMALOG KWIKPEN 100 UNIT/ML KwikPen Inject 3-10 Units into the skin 3 (three) times daily before meals. Per sliding scale     HYDROcodone-acetaminophen (NORCO/VICODIN) 5-325 MG tablet Take 1 tablet by mouth every 6 (six) hours as needed for moderate pain. 30 tablet 0   ibuprofen (ADVIL) 800 MG tablet Take 1 tablet (800 mg total) by mouth every 8 (eight) hours as needed. 30 tablet 2   Lancets Misc. (ACCU-CHEK SOFTCLIX LANCET DEV) KIT 1 Units by Does not apply route 2 (two) times daily. Check FSBS BID - Include strips # 50 with 5 refills, Lancets #50 with 5 refills DX: Type 2 DM - ICD 10: E11.9 1 kit 0   lidocaine-prilocaine (EMLA) cream Apply 1 application topically as needed. 30 g 0   linaclotide (LINZESS) 72 MCG capsule Take 72 mcg by mouth as needed.     magic mouthwash (nystatin, lidocaine, diphenhydrAMINE, alum & mag hydroxide) suspension Swish and swallow 5 mLs by mouth 4 (four) times daily as needed for mouth pain 140 mL 0   Multiple Vitamins-Minerals (WOMENS MULTI PO) Take 15 mLs by mouth daily.     ondansetron (ZOFRAN) 8 MG tablet Take 1 tablet (8 mg total) by mouth every 8 (eight) hours as needed for nausea or vomiting (begin on day 3 after chemo). 30 tablet 1   oxyCODONE (OXY IR/ROXICODONE) 5 MG immediate release tablet Take 1 tablet (5 mg total) by mouth every 8 (eight) hours as needed for severe pain. 60 tablet 0   pantoprazole (PROTONIX) 40 MG tablet TAKE 1 TABLET BY MOUTH EVERY DAY 90 tablet 1   prochlorperazine (COMPAZINE) 10 MG tablet Take 1 tablet (10 mg total) by mouth every 6 (six) hours as needed (Nausea or vomiting). 30 tablet 1    promethazine (PHENERGAN) 25 MG tablet Take 1 tablet (25 mg total) by mouth every 6 (six) hours as needed for nausea or vomiting. 30 tablet 0   sacubitril-valsartan (ENTRESTO) 97-103 MG Take 1 tablet by mouth 2 (two) times daily. 60 tablet 10   spironolactone (ALDACTONE) 25 MG tablet Take 1 tablet (25 mg total) by mouth daily. 30 tablet 10   vitamin B-12 (CYANOCOBALAMIN) 1000 MCG tablet Take 1,000 mcg by mouth daily.     zinc gluconate 50 MG tablet Take 50 mg by mouth daily.     Current Facility-Administered Medications  Medication Dose Route Frequency Provider Last Rate Last Admin   insulin glulisine (APIDRA) injection 6 Units  6 Units Subcutaneous Once Irene Pap, PA-C  Allergies  Allergen Reactions   Morphine And Related Nausea And Vomiting   Latex Hives and Itching    Burning    Tylenol With Codeine #3 [Acetaminophen-Codeine] Nausea And Vomiting   Penicillins Nausea Only, Swelling and Rash    Has patient had a PCN reaction causing immediate rash, facial/tongue/throat swelling, SOB or lightheadedness with hypotension: Yes Has patient had a PCN reaction causing severe rash involving mucus membranes or skin necrosis: Yes Has patient had a PCN reaction that required hospitalization: No Has patient had a PCN reaction occurring within the last 10 years: No TONGUE SWELLING AND RASH AROUND MOUTH If all of the above answers are "NO", then may proceed with Cephalosporin use.       Social History   Socioeconomic History   Marital status: Widowed    Spouse name: Not on file   Number of children: 2   Years of education: Not on file   Highest education level: Not on file  Occupational History   Not on file  Tobacco Use   Smoking status: Former    Packs/day: 0.25    Years: 23.00    Total pack years: 5.75    Types: Cigarettes    Quit date: 10/09/2015    Years since quitting: 6.7   Smokeless tobacco: Never  Vaping Use   Vaping Use: Never used  Substance and Sexual  Activity   Alcohol use: No   Drug use: No   Sexual activity: Never  Other Topics Concern   Not on file  Social History Narrative   She is a widowed mother of 2 (daughter and son).   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   She moved to [place] apparently back in 2016, but was visiting family in [place] and was diagnosed with Cardiomyopathy-EF 18%.   She is usually accompanied by her daughter.    At baseline, she walks roughly 1-2 miles a day.   Social Determinants of Health   Financial Resource Strain: Low Risk  (11/01/2021)   Overall Financial Resource Strain (CARDIA)    Difficulty of Paying Living Expenses: Not hard at all  Food Insecurity: No Food Insecurity (09/01/2020)   Hunger Vital Sign    Worried About Running Out of Food in the Last Year: Never true    Ran Out of Food in the Last Year: Never true  Transportation Needs: No Transportation Needs (11/01/2021)   PRAPARE - Hydrologist (Medical): No    Lack of Transportation (Non-Medical): No  Physical Activity: Not on file  Stress: Stress Concern Present (09/01/2020)   [place]    Feeling of Stress : Rather much  Social Connections: Unknown (09/01/2020)   Social Connection and Isolation Panel [NHANES]    Frequency of Communication with Friends and Family: More than three times a week    Frequency of Social Gatherings with Friends and Family: More than three times a week    Attends Religious Services: More than 4 times per year    Active Member of [employer] or Organizations: No    Attends Archivist Meetings: Never    Marital Status: Not on file  Intimate Partner Violence: Not on file      Family History  Problem Relation Age of Onset   Colon cancer Mother 17       deceased 57   Heart attack Father    Breast cancer Other 70  2nd cousin on maternal side; currently 29   Pancreatic cancer Other        2nd  cousin once removed on maternal side; deceased 58s   Fibromyalgia Sister    Esophageal cancer Neg Hx    Rectal cancer Neg Hx    Stomach cancer Neg Hx     Vitals:   07/01/22 1049  BP: 110/70  Pulse: 90  SpO2: 99%  Weight: 100.4 kg (221 lb 6.4 oz)    PHYSICAL EXAM: General:  Well appearing. No resp difficulty HEENT: normal Neck: supple. no JVD. Carotids 2+ bilat; no bruits. No lymphadenopathy or thryomegaly appreciated. Cor: PMI nondisplaced. Regular rate & rhythm. No rubs, gallops or murmurs. Lungs: clear Abdomen: soft, nontender, nondistended. No hepatosplenomegaly. No bruits or masses. Good bowel sounds. Extremities: no cyanosis, clubbing, rash, 2-3+ edema R>L  Neuro: alert & orientedx3, cranial nerves grossly intact. moves all 4 extremities w/o difficulty. Affect pleasant   ASSESSMENT & PLAN:  1. Metastatic left breast cancer - agree with plan for Enhertu - discussed small risk of reversible cardiotoxicty with Enhertu but benefit seems to clearly outweigh the risks - EF stable on echo today - Off Enhertu. Now on [place] (microtubule inhibitor) & XRT - Follow echos closely.   2. Chronic systolic CHF/?NICM: -Diagnosed in 2016. Unclear etiology -Echo 12/2015 in [place]: EF 18%, RV reduced, moderate MR -Echo 05/2017: EF 45-50% -Echo 08/2017: EF 45-50%, mild LVH, RV okay -Echo 01/31/22 (not on Enhertu) with EF 35-40%, GLS -12.9%, RV mildly reduced - Echo 04/01/22 EF ~ 40% (read as 45-50%)  - cMRI 9/23 EF 35-40% RVEF 43% Mod MR. No LGE - Echo today 07/01/22 EF 40-45% Personally reviewed - NYHA II. Volume up. Take lasix. Place TED hose - Continue carvedilol 18.75 bid - Continue Entresto 97/103 bid - Continue Spiro 25 - Consider SGLT2i soon but won't start with recent dehyrdation episode - EF not improving with GDMT. Plan cath to exclude underlying CAD as cause of cardiomyopathy (once XRT completed) - Recent bloodwork reviewed and ok  3. RLE edema - Place TED hose. Take  lasix prn   4. HTN - BP well controlled at home.      Glori Bickers, MD  11:26 AM

## 2022-07-01 NOTE — Patient Instructions (Signed)
START Jardiance 10 mg daily.  You are scheduled for a Cardiac Catheterization on Wednesday, December 13 with Dr. Glori Bickers.  1. Please arrive at the [facility] (Main Entrance A) at [facility]: [address] [place], [place] [address] at 8:00 AM (This time is two hours before your procedure to ensure your preparation). Free valet parking service is available.   Special note: Every effort is made to have your procedure done on time. Please understand that emergencies sometimes delay scheduled procedures.  2. Diet: Do not eat solid foods after midnight.  The patient may have clear liquids until 5am upon the day of the procedure.  3. Medication instructions in preparation for your procedure:   Contrast Allergy: No  TAKE half a dose of Insulin the night before procedure 07/23/22  HOLD Lasix, Spironolactone, Jardiance and Insulin the morning of procedure  On the morning of your procedure, take your Aspirin 81 mg and any morning medicines NOT listed above.  You may use sips of water.  5. Plan for one night stay--bring personal belongings. 6. Bring a current list of your medications and current insurance cards. 7. You MUST have a responsible person to drive you home. 8. Someone MUST be with you the first 24 hours after you arrive home or your discharge will be delayed. 9. Please wear clothes that are easy to get on and off and wear slip-on shoes.   PLEASE WEAR TED HOSE STOCKINGS.   Your physician recommends that you schedule a follow-up appointment in: 3 months (February 2024)  ** please call the office in [facility] December to arrange your follow up appointment **  If you have any questions or concerns before your next appointment please send [place] a message through [place] or call our office at [phone].    TO LEAVE A MESSAGE FOR THE NURSE SELECT OPTION 2, PLEASE LEAVE A MESSAGE INCLUDING: YOUR NAME DATE OF BIRTH CALL BACK NUMBER REASON FOR CALL**this is important as we  prioritize the call backs  YOU WILL RECEIVE A CALL BACK THE SAME DAY AS LONG AS YOU CALL BEFORE 4:00 PM  At the [facility], you and your health needs are our priority. As part of our continuing mission to provide you with exceptional heart care, we have created designated Provider Care Teams. These Care Teams include your primary Cardiologist (physician) and Advanced Practice Providers (APPs- Physician Assistants and Nurse Practitioners) who all work together to provide you with the care you need, when you need it.   You may see any of the following providers on your designated Care Team at your next follow up: Dr Glori Bickers Dr Loralie Champagne Dr. Roxana Hires, NP Lyda Jester, [place] [facility], [place] Forestine Na, NP Audry Riles, PharmD   Please be sure to bring in all your medications bottles to every appointment.

## 2022-07-01 NOTE — Progress Notes (Signed)
Echocardiogram 2D Echocardiogram has been performed.  Kristin Ward 07/01/2022, 10:57 AM

## 2022-07-01 NOTE — Telephone Encounter (Signed)
Spoke with patient confirming upcoming appointments  

## 2022-07-02 ENCOUNTER — Other Ambulatory Visit: Payer: Self-pay

## 2022-07-02 ENCOUNTER — Ambulatory Visit
Admission: RE | Admit: 2022-07-02 | Discharge: 2022-07-02 | Disposition: A | Discharge status: Home / Self Care | Payer: Medicare Other | Source: Ambulatory Visit | Attending: Radiation Oncology | Admitting: Radiation Oncology

## 2022-07-02 DIAGNOSIS — Z171 Estrogen receptor negative status [ER-]: Secondary | ICD-10-CM | POA: Diagnosis not present

## 2022-07-02 DIAGNOSIS — C50512 Malignant neoplasm of lower-outer quadrant of left female breast: Secondary | ICD-10-CM | POA: Diagnosis not present

## 2022-07-02 DIAGNOSIS — C7951 Secondary malignant neoplasm of bone: Secondary | ICD-10-CM | POA: Diagnosis not present

## 2022-07-02 DIAGNOSIS — Z170 Estrogen receptor positive status [ER+]: Secondary | ICD-10-CM | POA: Diagnosis not present

## 2022-07-02 DIAGNOSIS — I5042 Chronic combined systolic (congestive) and diastolic (congestive) heart failure: Secondary | ICD-10-CM | POA: Diagnosis not present

## 2022-07-02 DIAGNOSIS — Z5111 Encounter for antineoplastic chemotherapy: Secondary | ICD-10-CM | POA: Diagnosis not present

## 2022-07-02 DIAGNOSIS — C774 Secondary and unspecified malignant neoplasm of inguinal and lower limb lymph nodes: Secondary | ICD-10-CM | POA: Diagnosis not present

## 2022-07-02 DIAGNOSIS — I110 Hypertensive heart disease with heart failure: Secondary | ICD-10-CM | POA: Diagnosis not present

## 2022-07-02 DIAGNOSIS — Z510 Encounter for antineoplastic radiation therapy: Secondary | ICD-10-CM | POA: Diagnosis not present

## 2022-07-02 LAB — RAD ONC ARIA SESSION SUMMARY
Course Elapsed Days: 8
Plan Fractions Treated to Date: 7
Plan Prescribed Dose Per Fraction: 2.5 Gy
Plan Total Fractions Prescribed: 15
Plan Total Prescribed Dose: 37.5 Gy
Reference Point Dosage Given to Date: 17.5 Gy
Reference Point Session Dosage Given: 2.5 Gy
Session Number: 7

## 2022-07-03 ENCOUNTER — Other Ambulatory Visit: Payer: Self-pay

## 2022-07-03 ENCOUNTER — Ambulatory Visit
Admission: RE | Admit: 2022-07-03 | Discharge: 2022-07-03 | Disposition: A | Discharge status: Home / Self Care | Payer: Medicare Other | Source: Ambulatory Visit | Attending: Radiation Oncology | Admitting: Radiation Oncology

## 2022-07-03 DIAGNOSIS — I5042 Chronic combined systolic (congestive) and diastolic (congestive) heart failure: Secondary | ICD-10-CM | POA: Diagnosis not present

## 2022-07-03 DIAGNOSIS — C7951 Secondary malignant neoplasm of bone: Secondary | ICD-10-CM | POA: Diagnosis not present

## 2022-07-03 DIAGNOSIS — Z170 Estrogen receptor positive status [ER+]: Secondary | ICD-10-CM | POA: Diagnosis not present

## 2022-07-03 DIAGNOSIS — Z5111 Encounter for antineoplastic chemotherapy: Secondary | ICD-10-CM | POA: Diagnosis not present

## 2022-07-03 DIAGNOSIS — I110 Hypertensive heart disease with heart failure: Secondary | ICD-10-CM | POA: Diagnosis not present

## 2022-07-03 DIAGNOSIS — C50512 Malignant neoplasm of lower-outer quadrant of left female breast: Secondary | ICD-10-CM | POA: Diagnosis not present

## 2022-07-03 DIAGNOSIS — Z171 Estrogen receptor negative status [ER-]: Secondary | ICD-10-CM | POA: Diagnosis not present

## 2022-07-03 DIAGNOSIS — C774 Secondary and unspecified malignant neoplasm of inguinal and lower limb lymph nodes: Secondary | ICD-10-CM | POA: Diagnosis not present

## 2022-07-03 DIAGNOSIS — Z510 Encounter for antineoplastic radiation therapy: Secondary | ICD-10-CM | POA: Diagnosis not present

## 2022-07-03 LAB — RAD ONC ARIA SESSION SUMMARY
Course Elapsed Days: 9
Plan Fractions Treated to Date: 8
Plan Prescribed Dose Per Fraction: 2.5 Gy
Plan Total Fractions Prescribed: 15
Plan Total Prescribed Dose: 37.5 Gy
Reference Point Dosage Given to Date: 20 Gy
Reference Point Session Dosage Given: 2.5 Gy
Session Number: 8

## 2022-07-05 ENCOUNTER — Inpatient Hospital Stay: Payer: Medicare Other

## 2022-07-05 ENCOUNTER — Other Ambulatory Visit: Payer: Self-pay

## 2022-07-05 VITALS — BP 148/88 | HR 90 | Temp 98.5°F | Resp 18 | Wt 221.5 lb

## 2022-07-05 DIAGNOSIS — C50512 Malignant neoplasm of lower-outer quadrant of left female breast: Secondary | ICD-10-CM

## 2022-07-05 DIAGNOSIS — Z7189 Other specified counseling: Secondary | ICD-10-CM

## 2022-07-05 DIAGNOSIS — C774 Secondary and unspecified malignant neoplasm of inguinal and lower limb lymph nodes: Secondary | ICD-10-CM | POA: Diagnosis not present

## 2022-07-05 DIAGNOSIS — I5042 Chronic combined systolic (congestive) and diastolic (congestive) heart failure: Secondary | ICD-10-CM | POA: Diagnosis not present

## 2022-07-05 DIAGNOSIS — Z95828 Presence of other vascular implants and grafts: Secondary | ICD-10-CM

## 2022-07-05 DIAGNOSIS — I110 Hypertensive heart disease with heart failure: Secondary | ICD-10-CM | POA: Diagnosis not present

## 2022-07-05 DIAGNOSIS — C7951 Secondary malignant neoplasm of bone: Secondary | ICD-10-CM | POA: Diagnosis not present

## 2022-07-05 DIAGNOSIS — Z510 Encounter for antineoplastic radiation therapy: Secondary | ICD-10-CM | POA: Diagnosis not present

## 2022-07-05 DIAGNOSIS — Z170 Estrogen receptor positive status [ER+]: Secondary | ICD-10-CM | POA: Diagnosis not present

## 2022-07-05 DIAGNOSIS — Z5111 Encounter for antineoplastic chemotherapy: Secondary | ICD-10-CM | POA: Diagnosis not present

## 2022-07-05 LAB — CBC WITH DIFFERENTIAL (CANCER CENTER ONLY)
Abs Immature Granulocytes: 0 10*3/uL (ref 0.00–0.07)
Band Neutrophils: 2 %
Basophils Absolute: 0.1 10*3/uL (ref 0.0–0.1)
Basophils Relative: 2 %
Eosinophils Absolute: 0 10*3/uL (ref 0.0–0.5)
Eosinophils Relative: 0 %
HCT: 30 % — ABNORMAL LOW (ref 36.0–46.0)
Hemoglobin: 9.9 g/dL — ABNORMAL LOW (ref 12.0–15.0)
Lymphocytes Relative: 15 %
Lymphs Abs: 0.4 10*3/uL — ABNORMAL LOW (ref 0.7–4.0)
MCH: 30.4 pg (ref 26.0–34.0)
MCHC: 33 g/dL (ref 30.0–36.0)
MCV: 92 fL (ref 80.0–100.0)
Monocytes Absolute: 0.2 10*3/uL (ref 0.1–1.0)
Monocytes Relative: 9 %
Neutro Abs: 2 10*3/uL (ref 1.7–7.7)
Neutrophils Relative %: 72 %
Platelet Count: 191 10*3/uL (ref 150–400)
RBC: 3.26 MIL/uL — ABNORMAL LOW (ref 3.87–5.11)
RDW: 15.8 % — ABNORMAL HIGH (ref 11.5–15.5)
WBC Count: 2.7 10*3/uL — ABNORMAL LOW (ref 4.0–10.5)
nRBC: 1.5 % — ABNORMAL HIGH (ref 0.0–0.2)

## 2022-07-05 LAB — CMP (CANCER CENTER ONLY)
ALT: 13 U/L (ref 0–44)
AST: 26 U/L (ref 15–41)
Albumin: 3.9 g/dL (ref 3.5–5.0)
Alkaline Phosphatase: 72 U/L (ref 38–126)
Anion gap: 5 (ref 5–15)
BUN: 11 mg/dL (ref 8–23)
CO2: 26 mmol/L (ref 22–32)
Calcium: 9.8 mg/dL (ref 8.9–10.3)
Chloride: 108 mmol/L (ref 98–111)
Creatinine: 0.69 mg/dL (ref 0.44–1.00)
GFR, Estimated: >60 mL/min (ref >60)
Glucose, Bld: 245 mg/dL — ABNORMAL HIGH (ref 70–99)
Potassium: 3.6 mmol/L (ref 3.5–5.1)
Sodium: 139 mmol/L (ref 135–145)
Total Bilirubin: 0.6 mg/dL (ref 0.3–1.2)
Total Protein: 7.1 g/dL (ref 6.5–8.1)

## 2022-07-05 LAB — MAGNESIUM: Magnesium: 1.8 mg/dL (ref 1.7–2.4)

## 2022-07-05 MED ORDER — SODIUM CHLORIDE 0.9 % IV SOLN
1.4900 mg/m2 | Freq: Once | INTRAVENOUS | Status: AC
  Administered 2022-07-05: 3 mg via INTRAVENOUS
  Filled 2022-07-05: qty 6

## 2022-07-05 MED ORDER — HEPARIN SOD (PORK) LOCK FLUSH 100 UNIT/ML IV SOLN
500.0000 [IU] | Freq: Once | INTRAVENOUS | Status: AC | PRN
  Administered 2022-07-05: 500 [IU]

## 2022-07-05 MED ORDER — SODIUM CHLORIDE 0.9 % IV SOLN: Freq: Once | INTRAVENOUS | Status: AC

## 2022-07-05 MED ORDER — SODIUM CHLORIDE 0.9% FLUSH
10.0000 mL | INTRAVENOUS | Status: DC | PRN
  Administered 2022-07-05: 10 mL

## 2022-07-05 MED ORDER — SODIUM CHLORIDE 0.9% FLUSH
10.0000 mL | INTRAVENOUS | Status: DC | PRN
  Administered 2022-07-05: 10 mL via INTRAVENOUS

## 2022-07-05 MED ORDER — PROCHLORPERAZINE MALEATE 10 MG PO TABS
10.0000 mg | ORAL_TABLET | Freq: Once | ORAL | Status: AC
  Administered 2022-07-05: 10 mg via ORAL
  Filled 2022-07-05: qty 1

## 2022-07-05 NOTE — Patient Instructions (Signed)
[facility]  Discharge Instructions: Thank you for choosing [facility] to provide your oncology and hematology care.   If you have a lab appointment with the [facility], please go directly to the [facility] and check in at the registration area.   Wear comfortable clothing and clothing appropriate for easy access to any Portacath or PICC line.   We strive to give you quality time with your provider. You may need to reschedule your appointment if you arrive late (15 or more minutes).  Arriving late affects you and other patients whose appointments are after yours.  Also, if you miss three or more appointments without notifying the office, you may be dismissed from the clinic at the provider's discretion.      For prescription refill requests, have your pharmacy contact our office and allow 72 hours for refills to be completed.    Today you received the following chemotherapy and/or immunotherapy agents: Halaven      To help prevent nausea and vomiting after your treatment, we encourage you to take your nausea medication as directed.  BELOW ARE SYMPTOMS THAT SHOULD BE REPORTED IMMEDIATELY: *FEVER GREATER THAN 100.4 F (38 C) OR HIGHER *CHILLS OR SWEATING *NAUSEA AND VOMITING THAT IS NOT CONTROLLED WITH YOUR NAUSEA MEDICATION *UNUSUAL SHORTNESS OF BREATH *UNUSUAL BRUISING OR BLEEDING *URINARY PROBLEMS (pain or burning when urinating, or frequent urination) *BOWEL PROBLEMS (unusual diarrhea, constipation, pain near the anus) TENDERNESS IN MOUTH AND THROAT WITH OR WITHOUT PRESENCE OF ULCERS (sore throat, sores in mouth, or a toothache) UNUSUAL RASH, SWELLING OR PAIN  UNUSUAL VAGINAL DISCHARGE OR ITCHING   Items with * indicate a potential emergency and should be followed up as soon as possible or go to the Emergency Department if any problems should occur.  Please show the CHEMOTHERAPY ALERT CARD or IMMUNOTHERAPY ALERT CARD at check-in to  the Emergency Department and triage nurse.  Should you have questions after your visit or need to cancel or reschedule your appointment, please contact [facility]  Dept: [phone]  and follow the prompts.  Office hours are 8:00 a.m. to 4:30 p.m. Monday - Friday. Please note that voicemails left after 4:00 p.m. may not be returned until the following business day.  We are closed weekends and major holidays. You have access to a nurse at all times for urgent questions. Please call the main number to the clinic Dept: [phone] and follow the prompts.   For any non-urgent questions, you may also contact your provider using MyChart. We now offer e-Visits for anyone 27 and older to request care online for non-urgent symptoms. For details visit [email or web address].   Also download the MyChart app! Go to the app store, search "MyChart", open the app, select , and log in with your MyChart username and password.  Masks are optional in the cancer centers. If you would like for your care team to wear a mask while they are taking care of you, please let them know. You may have one support person who is at least 64 years old accompany you for your appointments.

## 2022-07-05 NOTE — Progress Notes (Signed)
Ok to treat today with ECHO from 07/01/22 40-45% per Dr. Lorenso Courier- not new EF. Followed by Dr. Haroldine Laws per Dr. Ernestina Penna note.

## 2022-07-08 ENCOUNTER — Ambulatory Visit: Payer: Medicare Other | Admitting: Family Medicine

## 2022-07-08 ENCOUNTER — Ambulatory Visit
Admission: RE | Admit: 2022-07-08 | Discharge: 2022-07-08 | Disposition: A | Discharge status: Home / Self Care | Payer: Medicare Other | Source: Ambulatory Visit | Attending: Radiation Oncology | Admitting: Radiation Oncology

## 2022-07-08 ENCOUNTER — Other Ambulatory Visit: Payer: Self-pay

## 2022-07-08 DIAGNOSIS — Z171 Estrogen receptor negative status [ER-]: Secondary | ICD-10-CM | POA: Diagnosis not present

## 2022-07-08 DIAGNOSIS — C50512 Malignant neoplasm of lower-outer quadrant of left female breast: Secondary | ICD-10-CM | POA: Diagnosis not present

## 2022-07-08 DIAGNOSIS — C7951 Secondary malignant neoplasm of bone: Secondary | ICD-10-CM | POA: Diagnosis not present

## 2022-07-08 DIAGNOSIS — Z170 Estrogen receptor positive status [ER+]: Secondary | ICD-10-CM | POA: Diagnosis not present

## 2022-07-08 DIAGNOSIS — I5042 Chronic combined systolic (congestive) and diastolic (congestive) heart failure: Secondary | ICD-10-CM | POA: Diagnosis not present

## 2022-07-08 DIAGNOSIS — Z5111 Encounter for antineoplastic chemotherapy: Secondary | ICD-10-CM | POA: Diagnosis not present

## 2022-07-08 DIAGNOSIS — I110 Hypertensive heart disease with heart failure: Secondary | ICD-10-CM | POA: Diagnosis not present

## 2022-07-08 DIAGNOSIS — Z510 Encounter for antineoplastic radiation therapy: Secondary | ICD-10-CM | POA: Diagnosis not present

## 2022-07-08 DIAGNOSIS — C774 Secondary and unspecified malignant neoplasm of inguinal and lower limb lymph nodes: Secondary | ICD-10-CM | POA: Diagnosis not present

## 2022-07-08 LAB — RAD ONC ARIA SESSION SUMMARY
Course Elapsed Days: 14
Plan Fractions Treated to Date: 9
Plan Prescribed Dose Per Fraction: 2.5 Gy
Plan Total Fractions Prescribed: 15
Plan Total Prescribed Dose: 37.5 Gy
Reference Point Dosage Given to Date: 22.5 Gy
Reference Point Session Dosage Given: 2.5 Gy
Session Number: 9

## 2022-07-09 ENCOUNTER — Telehealth (HOSPITAL_COMMUNITY): Payer: Self-pay | Admitting: *Deleted

## 2022-07-09 ENCOUNTER — Ambulatory Visit
Admission: RE | Admit: 2022-07-09 | Discharge: 2022-07-09 | Disposition: A | Discharge status: Home / Self Care | Payer: Medicare Other | Source: Ambulatory Visit | Attending: Radiation Oncology | Admitting: Radiation Oncology

## 2022-07-09 ENCOUNTER — Other Ambulatory Visit: Payer: Self-pay

## 2022-07-09 DIAGNOSIS — Z5111 Encounter for antineoplastic chemotherapy: Secondary | ICD-10-CM | POA: Diagnosis not present

## 2022-07-09 DIAGNOSIS — C774 Secondary and unspecified malignant neoplasm of inguinal and lower limb lymph nodes: Secondary | ICD-10-CM | POA: Diagnosis not present

## 2022-07-09 DIAGNOSIS — C7951 Secondary malignant neoplasm of bone: Secondary | ICD-10-CM | POA: Diagnosis not present

## 2022-07-09 DIAGNOSIS — Z510 Encounter for antineoplastic radiation therapy: Secondary | ICD-10-CM | POA: Diagnosis not present

## 2022-07-09 DIAGNOSIS — I5042 Chronic combined systolic (congestive) and diastolic (congestive) heart failure: Secondary | ICD-10-CM | POA: Diagnosis not present

## 2022-07-09 DIAGNOSIS — C50512 Malignant neoplasm of lower-outer quadrant of left female breast: Secondary | ICD-10-CM | POA: Diagnosis not present

## 2022-07-09 DIAGNOSIS — Z171 Estrogen receptor negative status [ER-]: Secondary | ICD-10-CM | POA: Diagnosis not present

## 2022-07-09 DIAGNOSIS — Z170 Estrogen receptor positive status [ER+]: Secondary | ICD-10-CM | POA: Diagnosis not present

## 2022-07-09 DIAGNOSIS — I110 Hypertensive heart disease with heart failure: Secondary | ICD-10-CM | POA: Diagnosis not present

## 2022-07-09 LAB — RAD ONC ARIA SESSION SUMMARY
Course Elapsed Days: 15
Plan Fractions Treated to Date: 10
Plan Prescribed Dose Per Fraction: 2.5 Gy
Plan Total Fractions Prescribed: 15
Plan Total Prescribed Dose: 37.5 Gy
Reference Point Dosage Given to Date: 25 Gy
Reference Point Session Dosage Given: 2.5 Gy
Session Number: 10

## 2022-07-09 NOTE — Telephone Encounter (Signed)
Clinicals faxed

## 2022-07-10 ENCOUNTER — Other Ambulatory Visit: Payer: Self-pay

## 2022-07-10 ENCOUNTER — Ambulatory Visit
Admission: RE | Admit: 2022-07-10 | Discharge: 2022-07-10 | Disposition: A | Discharge status: Home / Self Care | Payer: Medicare Other | Source: Ambulatory Visit | Attending: Radiation Oncology | Admitting: Radiation Oncology

## 2022-07-10 DIAGNOSIS — Z5111 Encounter for antineoplastic chemotherapy: Secondary | ICD-10-CM | POA: Diagnosis not present

## 2022-07-10 DIAGNOSIS — Z510 Encounter for antineoplastic radiation therapy: Secondary | ICD-10-CM | POA: Diagnosis not present

## 2022-07-10 DIAGNOSIS — I110 Hypertensive heart disease with heart failure: Secondary | ICD-10-CM | POA: Diagnosis not present

## 2022-07-10 DIAGNOSIS — C50512 Malignant neoplasm of lower-outer quadrant of left female breast: Secondary | ICD-10-CM | POA: Diagnosis not present

## 2022-07-10 DIAGNOSIS — C7951 Secondary malignant neoplasm of bone: Secondary | ICD-10-CM | POA: Diagnosis not present

## 2022-07-10 DIAGNOSIS — Z171 Estrogen receptor negative status [ER-]: Secondary | ICD-10-CM | POA: Diagnosis not present

## 2022-07-10 DIAGNOSIS — Z170 Estrogen receptor positive status [ER+]: Secondary | ICD-10-CM | POA: Diagnosis not present

## 2022-07-10 DIAGNOSIS — I5042 Chronic combined systolic (congestive) and diastolic (congestive) heart failure: Secondary | ICD-10-CM | POA: Diagnosis not present

## 2022-07-10 DIAGNOSIS — C774 Secondary and unspecified malignant neoplasm of inguinal and lower limb lymph nodes: Secondary | ICD-10-CM | POA: Diagnosis not present

## 2022-07-10 LAB — RAD ONC ARIA SESSION SUMMARY
Course Elapsed Days: 16
Plan Fractions Treated to Date: 11
Plan Prescribed Dose Per Fraction: 2.5 Gy
Plan Total Fractions Prescribed: 15
Plan Total Prescribed Dose: 37.5 Gy
Reference Point Dosage Given to Date: 27.5 Gy
Reference Point Session Dosage Given: 2.5 Gy
Session Number: 11

## 2022-07-11 ENCOUNTER — Ambulatory Visit: Payer: Medicare Other

## 2022-07-11 ENCOUNTER — Telehealth: Payer: Self-pay | Admitting: Radiation Oncology

## 2022-07-11 NOTE — Telephone Encounter (Signed)
Patient called stating she wasn't feeling well, and wanted to cancel [facility] 11/30. L4 notified.

## 2022-07-12 ENCOUNTER — Ambulatory Visit
Admission: RE | Admit: 2022-07-12 | Discharge: 2022-07-12 | Disposition: A | Discharge status: Home / Self Care | Payer: Medicare Other | Source: Ambulatory Visit | Attending: Radiation Oncology | Admitting: Radiation Oncology

## 2022-07-12 ENCOUNTER — Other Ambulatory Visit: Payer: Self-pay

## 2022-07-12 DIAGNOSIS — C7951 Secondary malignant neoplasm of bone: Secondary | ICD-10-CM | POA: Diagnosis not present

## 2022-07-12 DIAGNOSIS — Z510 Encounter for antineoplastic radiation therapy: Secondary | ICD-10-CM | POA: Insufficient documentation

## 2022-07-12 DIAGNOSIS — C50512 Malignant neoplasm of lower-outer quadrant of left female breast: Secondary | ICD-10-CM | POA: Diagnosis not present

## 2022-07-12 DIAGNOSIS — Z171 Estrogen receptor negative status [ER-]: Secondary | ICD-10-CM | POA: Diagnosis not present

## 2022-07-12 DIAGNOSIS — C774 Secondary and unspecified malignant neoplasm of inguinal and lower limb lymph nodes: Secondary | ICD-10-CM | POA: Insufficient documentation

## 2022-07-12 LAB — RAD ONC ARIA SESSION SUMMARY
Course Elapsed Days: 18
Plan Fractions Treated to Date: 12
Plan Prescribed Dose Per Fraction: 2.5 Gy
Plan Total Fractions Prescribed: 15
Plan Total Prescribed Dose: 37.5 Gy
Reference Point Dosage Given to Date: 30 Gy
Reference Point Session Dosage Given: 2.5 Gy
Session Number: 12

## 2022-07-15 ENCOUNTER — Other Ambulatory Visit: Payer: Self-pay

## 2022-07-15 ENCOUNTER — Ambulatory Visit
Admission: RE | Admit: 2022-07-15 | Discharge: 2022-07-15 | Disposition: A | Discharge status: Home / Self Care | Payer: Medicare Other | Source: Ambulatory Visit | Attending: Radiation Oncology | Admitting: Radiation Oncology

## 2022-07-15 ENCOUNTER — Other Ambulatory Visit: Payer: Self-pay | Admitting: Radiation Oncology

## 2022-07-15 ENCOUNTER — Telehealth: Payer: Self-pay | Admitting: *Deleted

## 2022-07-15 DIAGNOSIS — C7951 Secondary malignant neoplasm of bone: Secondary | ICD-10-CM

## 2022-07-15 DIAGNOSIS — Z171 Estrogen receptor negative status [ER-]: Secondary | ICD-10-CM | POA: Diagnosis not present

## 2022-07-15 DIAGNOSIS — C50512 Malignant neoplasm of lower-outer quadrant of left female breast: Secondary | ICD-10-CM

## 2022-07-15 DIAGNOSIS — R200 Anesthesia of skin: Secondary | ICD-10-CM

## 2022-07-15 DIAGNOSIS — C774 Secondary and unspecified malignant neoplasm of inguinal and lower limb lymph nodes: Secondary | ICD-10-CM | POA: Diagnosis not present

## 2022-07-15 DIAGNOSIS — Z510 Encounter for antineoplastic radiation therapy: Secondary | ICD-10-CM | POA: Diagnosis not present

## 2022-07-15 LAB — RAD ONC ARIA SESSION SUMMARY
Course Elapsed Days: 21
Plan Fractions Treated to Date: 13
Plan Prescribed Dose Per Fraction: 2.5 Gy
Plan Total Fractions Prescribed: 15
Plan Total Prescribed Dose: 37.5 Gy
Reference Point Dosage Given to Date: 32.5 Gy
Reference Point Session Dosage Given: 2.5 Gy
Session Number: 13

## 2022-07-15 NOTE — Progress Notes (Signed)
Pt presented to nursing for triage today and complained of numbness from the waist down. 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   She is currently receiving Halven. She has noticed these symptoms since last Friday. No known foot drop, loss of sensation, headache, blurred, vision, or changes in speech are noted. She is also receiving radiation currently for nodal metastasis in the pelvis. D/w nursing remotely would recommend STAT complete MRI spine to rule out cord compression, versus evaluation in the ED. Made clear risks of not pursuing work up if she does have cord compromise or an emergent medical issue. Orders placed for MRI. Pt in agreement to go to tomorrow but not today. Pt was encouraged by nursing to go to ED if symptoms acutely progressed.

## 2022-07-15 NOTE — Progress Notes (Signed)
                                                                                                                                                             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 She developed fatigue and loose stool during her treatment. She also noted lower extremity numbness and had a stat metastasis screening MRI of the entire spine that was negative for disease.  Plan: The patient will receive a call in about one month from the radiation oncology department. She will continue follow up with Dr. Burr Medico as well.   ________________________________________________    Carola Rhine, [facility]

## 2022-07-15 NOTE — Telephone Encounter (Signed)
Called patient to inform of MRI for 07-16-22- arrival time- 4:30 pm @ [facility], no restrictions to test, spoke with patient and she is aware of this scan and the instructions

## 2022-07-16 ENCOUNTER — Ambulatory Visit (HOSPITAL_COMMUNITY)
Admission: RE | Admit: 2022-07-16 | Discharge: 2022-07-16 | Disposition: A | Discharge status: Home / Self Care | Payer: Medicare Other | Source: Ambulatory Visit | Attending: Radiation Oncology | Admitting: Radiation Oncology

## 2022-07-16 ENCOUNTER — Ambulatory Visit: Payer: Medicare Other

## 2022-07-16 ENCOUNTER — Other Ambulatory Visit: Payer: Self-pay

## 2022-07-16 ENCOUNTER — Telehealth (HOSPITAL_COMMUNITY): Payer: Self-pay

## 2022-07-16 ENCOUNTER — Ambulatory Visit
Admission: RE | Admit: 2022-07-16 | Discharge: 2022-07-16 | Disposition: A | Discharge status: Home / Self Care | Payer: Medicare Other | Source: Ambulatory Visit | Attending: Radiation Oncology | Admitting: Radiation Oncology

## 2022-07-16 ENCOUNTER — Encounter: Payer: Self-pay | Admitting: Podiatry

## 2022-07-16 ENCOUNTER — Ambulatory Visit (INDEPENDENT_AMBULATORY_CARE_PROVIDER_SITE_OTHER): Payer: Medicare Other | Admitting: Podiatry

## 2022-07-16 VITALS — BP 143/97

## 2022-07-16 DIAGNOSIS — G893 Neoplasm related pain (acute) (chronic): Secondary | ICD-10-CM | POA: Insufficient documentation

## 2022-07-16 DIAGNOSIS — C50919 Malignant neoplasm of unspecified site of unspecified female breast: Secondary | ICD-10-CM | POA: Diagnosis not present

## 2022-07-16 DIAGNOSIS — Z171 Estrogen receptor negative status [ER-]: Secondary | ICD-10-CM | POA: Diagnosis not present

## 2022-07-16 DIAGNOSIS — C774 Secondary and unspecified malignant neoplasm of inguinal and lower limb lymph nodes: Secondary | ICD-10-CM | POA: Diagnosis not present

## 2022-07-16 DIAGNOSIS — Z510 Encounter for antineoplastic radiation therapy: Secondary | ICD-10-CM | POA: Diagnosis not present

## 2022-07-16 DIAGNOSIS — R200 Anesthesia of skin: Secondary | ICD-10-CM | POA: Diagnosis not present

## 2022-07-16 DIAGNOSIS — B351 Tinea unguium: Secondary | ICD-10-CM | POA: Diagnosis not present

## 2022-07-16 DIAGNOSIS — M79674 Pain in right toe(s): Secondary | ICD-10-CM | POA: Diagnosis not present

## 2022-07-16 DIAGNOSIS — E1165 Type 2 diabetes mellitus with hyperglycemia: Secondary | ICD-10-CM

## 2022-07-16 DIAGNOSIS — R609 Edema, unspecified: Secondary | ICD-10-CM

## 2022-07-16 DIAGNOSIS — G63 Polyneuropathy in diseases classified elsewhere: Secondary | ICD-10-CM

## 2022-07-16 DIAGNOSIS — C801 Malignant (primary) neoplasm, unspecified: Secondary | ICD-10-CM

## 2022-07-16 DIAGNOSIS — M79675 Pain in left toe(s): Secondary | ICD-10-CM

## 2022-07-16 DIAGNOSIS — C7951 Secondary malignant neoplasm of bone: Secondary | ICD-10-CM | POA: Insufficient documentation

## 2022-07-16 DIAGNOSIS — C50512 Malignant neoplasm of lower-outer quadrant of left female breast: Secondary | ICD-10-CM | POA: Diagnosis not present

## 2022-07-16 LAB — RAD ONC ARIA SESSION SUMMARY
Course Elapsed Days: 22
Plan Fractions Treated to Date: 14
Plan Prescribed Dose Per Fraction: 2.5 Gy
Plan Total Fractions Prescribed: 15
Plan Total Prescribed Dose: 37.5 Gy
Reference Point Dosage Given to Date: 35 Gy
Reference Point Session Dosage Given: 2.5 Gy
Session Number: 14

## 2022-07-16 NOTE — Progress Notes (Signed)
This patient presents to the office to have her nails trimmed.  She says the nails are painful walking and wearing her shoes.  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  She has an appointment tonight at [facility] for evaluation of feet. She presents for evaluation and treatment of her nails.  General Appearance  Alert, conversant and in no acute stress.  Vascular  Dorsalis pedis and posterior tibial  pulses are not palpable due to swelling. palpable  bilaterally.  Capillary return is within normal limits  bilaterally. Temperature is within normal limits  bilaterally.  Neurologic  Senn-Weinstein monofilament wire test diminished  bilaterally. Muscle power within normal limits bilaterally.  Nails Thick disfigured discolored nails with subungual debris  from hallux to fifth toes bilaterally. No evidence of bacterial infection or drainage bilaterally.  Orthopedic  No limitations of motion  feet .  No crepitus or effusions noted.  No bony pathology or digital deformities noted. Swelling feet  B/l.  Skin  normotropic skin with no porokeratosis noted bilaterally.  No signs of infections or ulcers noted.     Onychomycosis  Debride nails with nail nipper followed by dremel tool usage.    RTC 10 weeks    Farzana Koci DPM   

## 2022-07-16 NOTE — Telephone Encounter (Addendum)
Spoke with Kristin Ward's daughter. She stated it was ok to change the date of the [place] from 12/13 to 12/20. Cath Lab called and date changed.  Called patient and left message asking if we could mover her RHC from 12/13 to 12/20 at 10 am

## 2022-07-17 ENCOUNTER — Ambulatory Visit
Admission: RE | Admit: 2022-07-17 | Discharge: 2022-07-17 | Disposition: A | Discharge status: Home / Self Care | Payer: Medicare Other | Source: Ambulatory Visit | Attending: Radiation Oncology | Admitting: Radiation Oncology

## 2022-07-17 ENCOUNTER — Other Ambulatory Visit: Payer: Self-pay

## 2022-07-17 ENCOUNTER — Encounter: Payer: Self-pay | Admitting: Radiation Oncology

## 2022-07-17 DIAGNOSIS — C774 Secondary and unspecified malignant neoplasm of inguinal and lower limb lymph nodes: Secondary | ICD-10-CM | POA: Diagnosis not present

## 2022-07-17 DIAGNOSIS — C50512 Malignant neoplasm of lower-outer quadrant of left female breast: Secondary | ICD-10-CM | POA: Diagnosis not present

## 2022-07-17 DIAGNOSIS — Z171 Estrogen receptor negative status [ER-]: Secondary | ICD-10-CM | POA: Diagnosis not present

## 2022-07-17 DIAGNOSIS — Z510 Encounter for antineoplastic radiation therapy: Secondary | ICD-10-CM | POA: Diagnosis not present

## 2022-07-17 DIAGNOSIS — C7951 Secondary malignant neoplasm of bone: Secondary | ICD-10-CM | POA: Diagnosis not present

## 2022-07-17 LAB — RAD ONC ARIA SESSION SUMMARY
Course Elapsed Days: 23
Plan Fractions Treated to Date: 15
Plan Prescribed Dose Per Fraction: 2.5 Gy
Plan Total Fractions Prescribed: 15
Plan Total Prescribed Dose: 37.5 Gy
Reference Point Dosage Given to Date: 37.5 Gy
Reference Point Session Dosage Given: 2.5 Gy
Session Number: 15

## 2022-07-17 NOTE — Progress Notes (Signed)
I met with the patient after her last treatment to make sure she knew that the MRI of her spine we ordered did not show any concerns for metastatic disease in the spine as the source of her lower extremity numbness. It could be this is related to compression from her nodal disease in the pelvis on her genitofemoral nerve region, but it does not appear to be the result of radiation either. I encouraged her to follow up with Dr. Burr Medico for this as well.

## 2022-07-19 ENCOUNTER — Telehealth: Payer: Self-pay

## 2022-07-19 ENCOUNTER — Other Ambulatory Visit: Payer: Self-pay | Admitting: Hematology

## 2022-07-19 ENCOUNTER — Other Ambulatory Visit: Payer: Self-pay

## 2022-07-19 DIAGNOSIS — C50512 Malignant neoplasm of lower-outer quadrant of left female breast: Secondary | ICD-10-CM

## 2022-07-19 NOTE — Telephone Encounter (Signed)
Pt requesting if the Gabapentin be sent to [facility] for her neuropathy.  Pt also want to know if Dr. Burr Medico could prescribe something else for her cancer other than Eribulin.  Notified Dr. Burr Medico of pt's request.  Per Dr. Burr Medico referral to [facility] for symptom management.

## 2022-07-21 ENCOUNTER — Other Ambulatory Visit: Payer: Self-pay | Admitting: Hematology

## 2022-07-21 MED ORDER — GABAPENTIN 300 MG PO CAPS: 300.0000 mg | ORAL_CAPSULE | Freq: Two times a day (BID) | ORAL | 1 refills | Status: DC

## 2022-07-23 ENCOUNTER — Other Ambulatory Visit: Payer: Self-pay | Admitting: Hematology

## 2022-07-23 ENCOUNTER — Encounter: Payer: Self-pay | Admitting: Hematology

## 2022-07-24 ENCOUNTER — Other Ambulatory Visit: Payer: Self-pay

## 2022-07-24 DIAGNOSIS — C50919 Malignant neoplasm of unspecified site of unspecified female breast: Secondary | ICD-10-CM

## 2022-07-24 DIAGNOSIS — G620 Drug-induced polyneuropathy: Secondary | ICD-10-CM

## 2022-07-24 DIAGNOSIS — C50512 Malignant neoplasm of lower-outer quadrant of left female breast: Secondary | ICD-10-CM

## 2022-07-25 NOTE — Progress Notes (Signed)
[place]   [phone] [phone]   Clinic Follow up Note   Patient Care Team: Kristin Lung, MD as PCP - General (Family Medicine) Kristin Man, MD as PCP - Cardiology (Cardiology) Ward, Kristin Pascal, MD as PCP - Advanced Heart Failure (Cardiology) Kristin Merle, MD as Consulting Physician (Hematology) Kristin Klein, MD as Consulting Physician (General Surgery) Kristin Ward, [facility] as Pharmacist (Pharmacist) Kristin Pi, MD as Referring Physician (Endocrinology)  Date of Service:  07/26/2022  CHIEF COMPLAINT: f/u of metastatic breast cancer    CURRENT THERAPY:  Halaven, days 1+8 q21d, starting 06/07/22 -Zometa, q46month, starting 11/20/20 -palliative radiation to left hip, 06/24/22 - 07/16/22  ASSESSMENT:  PPadme Arriagais a 66y.o. female with   Malignant neoplasm of lower-outer quadrant of left breast of female, estrogen receptor negative (HBynum stage IB, p(T1cN0M0), Triple negative, Grade 3, in 2018, metastatic disease in 08/2020 ER 20% weakly + PR/HER2 (0) negative, PD-L1 0%, genetics (-), HER2 1+ on node biopsy  -diagnosed with triple negative breast cancer in 05/2017. S/p left breast lumpectomy, adjuvant chemo TC and Radiation.  -biopsy of right gluteus on 09/02/20 due to new pain showed metastatic carcinoma, consistent with breast primary. ER 20% weakly positive, PR and HER2 negative.  -s/p multiple line chemo, including taxol, xeloda, Trodelvy, enhertu -currently on Halavan, with worsening neuropathy, will stop it. -Discussed at the treatment options.  I recommend weekly carboplatin and gemcitabine, potential benefit and side effects discussed with her, she agrees to proceed.  Will start after Christmas.  Malignant neoplasm metastatic to bone ([facility] -She developed radiating right hip pain in 2020 from right iliac wing mass. S/p palliative radiation to right iliac 09/04/20 - 09/22/20 with Dr. MLisbeth Ward -Given localized bone met,  she started Zometa q39monthon 11/20/20  Peripheral neuropathy due to chemotherapy ([facility]-S/p C2D8 Taxol she developed mild tingling in her hands and feet. Taxol stopped after 12/18/20.  -worsening with chemo   Cancer related pain -received multiple palliative RT -on oxycodone   Ne[place]athy [place] with cancer ([place]-Secondary to diabetes and chemotherapy -Much worse lately, it has impacted her ability to walk on distance. -Refilled her oxycodone, she will continue gabapentin -Palliative care referral     PLAN: -Due to worsening severe peripheral neuropathy, will stop Halavan  -discuss next line chemo, plan to change to weekly carboplatin and gemcitabine, 2 weeks on and 1 week off -lab reviewed  -order CT scan to be done in 2 weeks  -she will see palliative care clinic NP Kristin Ward on 12/22 -I will see her back with first cycle carboplatin and gemcitabine on January 3    SUMMARY OF ONCOLOGIC HISTORY: Oncology History Overview Note  Cancer Staging Malignant neoplasm of lower-outer quadrant of left breast of female, estrogen receptor negative ([place] Highland[place]aging form: Breast, AJCC 8th Edition - Clinical stage from 06/06/2017: Stage IB (cT1c, cN0, cM0, G3, ER-, PR-, HER2-) - Signed by Kristin Ward on 06/15/2017 Nuclear grade: G3 Histologic grading system: 3 grade system - Pathologic stage from 06/26/2017: Stage IB (pT1c, pN0, cM0, G3, ER-, PR-, HER2-) - Signed by Kristin Ward on 07/10/2017 Neoadjuvant therapy: No Nuclear grade: G3 Multigene prognostic tests performed: None Histologic grading system: 3 grade system Laterality: Left     Malignant neoplasm of lower-outer quadrant of left breast of female, estrogen receptor negative (HCT[facility]ums 05/30/2017 Mammogram   Diagnostic mammo and [place]ION: 1. Highly suspicious 1.4 cm mass in the slightly lower slightly outer left  breast -tissue sampling recommended. 2. Indeterminate 0.5 mm mass in the slightly lower slightly outer left  breast -tissue sampling recommended. 3. At least 2 left axillary lymph nodes with borderline cortical thickness.   06/06/2017 Initial Biopsy   Diagnosis 1. Breast, left, needle core biopsy, 5:30 o'clock - INVASIVE DUCTAL CARCINOMA, G3 2. Lymph node, needle/core biopsy, left axillary - NO CARCINOMA IDENTIFIED IN ONE LYMPH NODE (0/1)   06/06/2017 Initial Diagnosis   Malignant neoplasm of lower-outer quadrant of left breast of female, estrogen receptor negative ([place])   06/06/2017 Receptors her2   Estrogen Receptor: 0%, NEGATIVE Progesterone Receptor: 0%, NEGATIVE Proliferation Marker Ki67: 70%   06/26/2017 Surgery   LEFT BREAST LUMPECTOMY WITH RADIOACTIVE SEED AND SENTINEL LYMPH NODE BIOPSY ERAS  PATHWAY AND INSERTION PORT-A-CATH By Dr. Barry Ward on 06/26/17    06/26/2017 Pathology Results   Diagnosis 06/26/17  1. Breast, lumpectomy, Left - INVASIVE DUCTAL CARCINOMA, GRADE III/III, SPANNING 1.2 CM. - THE SURGICAL RESECTION MARGINS ARE NEGATIVE FOR CARCINOMA. - SEE ONCOLOGY TABLE BELOW. 2. Lymph node, sentinel, biopsy, Left axillary #1 - THERE IS NO EVIDENCE OF CARCINOMA IN 1 OF 1 LYMPH NODE (0/1). 3. Lymph node, sentinel, biopsy, Left axillary #2 - THERE IS NO EVIDENCE OF CARCINOMA IN 1 OF 1 LYMPH NODE (0/1). 4. Lymph node, sentinel, biopsy, Left axillary #3 - THERE IS NO EVIDENCE OF CARCINOMA IN 1 OF 1 LYMPH NODE (0/1).    07/25/2017 - 09/29/2017 Chemotherapy   Adjuvant cytoxan and docetaxel (TC) every 3 weekd for 4 cycles     11/19/2017 - 12/17/2017 Radiation Therapy   Adjuvant breast radiation Left breast treated to 42.5 Gy with 17 fx of 2.5 Gy followed by a boost of 7.5 Gy with 3 fx of 2.5 Gy      02/2019 Procedure   She had PAC removal in 02/2019.    07/23/2020 Imaging   MRI Lumbar Spine 07/23/20 IMPRESSION: 1. No significant disc herniation, spinal canal or neural foraminal stenosis at any level. 2. Moderate facet degenerative changes at L3-4, L4-5 and L5-S1. 3.  Multiple enlarged retroperitoneal and right iliac lymph nodes. Recommend correlation with CT of the abdomen and pelvis with contrast.   08/15/2020 Imaging   CT AP 08/15/20  IMPRESSION: 1. Right iliac and periaortic adenopathy is noted concerning for metastatic disease or lymphoma. Also noted is abnormal soft tissue mass anterior and posterior to the right iliac wing concerning for malignancy or metastatic disease. MRI is recommended for further evaluation. 2. Irregular lucency with sclerotic margins is seen involving the right iliac crest concerning for possible lytic lesion. MRI may be performed for further evaluation. 3. Enlarged fibroid uterus. 4. Small gallstone. 5. Aortic atherosclerosis.   08/22/2020 Pathology Results   FINAL MICROSCOPIC DIAGNOSIS: 08/22/20  A. NEEDLE CORE, RIGHT GLUTEUS MINIMUMUS, BIOPSY:  -  Metastatic carcinoma  -  See comment   COMMENT:  By immunohistochemistry, the neoplastic cells are positive for  cytokeratin-7 and have weak patchy positivity for GATA3 but are negative for cytokeratin-20 and GCDFP.  The combined morphology and immunophenotype, support metastasis from the patient's known breast primary carcinoma.   ADDENDUM:  PROGNOSTIC INDICATOR RESULTS:  The tumor cells are NEGATIVE for Her2 (0).  Estrogen Receptor:       POSITIVE, 20%, WEAK STAINING  Progesterone Receptor:   NEGATIVE   PD-L1 (0) negative    09/04/2020 - 09/22/2020 Radiation Therapy   Palliative radiation to right iliac 09/04/20 - 09/22/20, Dr. Lisbeth Ward   09/26/2020 -  Chemotherapy  first line weekly Taxol 3 weeks on/1 week off starting 09/26/20 --Reduced to 2 weeks on/1 week off starting with C3 due to neuropathy. Stopped after C4 on 12/18/20 due to neuratrophy.  --Switched to Xeloda on 01/01/21 at 1573m in the AM and 20029min the PM for 2 weeks on/1 week off     10/13/2020 Procedure    PAC placement on 10/13/20.    04/10/2021 Imaging   CT CAP  IMPRESSION: 1. Enlarging abdominopelvic  retroperitoneal and right inguinal adenopathy, compatible with disease progression. 2. Lytic metastasis in the right iliac wing, similar. 3. Hepatomegaly. 4. Cholelithiasis. 5. Enlarged fibroid uterus. 6.  Aortic atherosclerosis (ICD10-I70.0).   06/18/2021 Imaging   CT CAP  IMPRESSION: 1. Today's study demonstrates progression of metastatic disease as evidence by increased number and size of numerous enlarged lymph nodes in the retroperitoneum, along the right pelvic sidewall, and in the upper right thigh. 2. Osseous metastases in the bony pelvis appear similar to the prior examination. No definite new osseous lesions are otherwise noted. 3. Fibroid uterus again noted. 4. Cholelithiasis without evidence of acute cholecystitis. 5. Aortic atherosclerosis, in addition to left anterior descending coronary artery disease. Please note that although the presence of coronary artery calcium documents the presence of coronary artery disease, the severity of this disease and any potential stenosis cannot be assessed on this non-gated CT examination. Assessment for potential risk factor modification, dietary therapy or pharmacologic therapy may be warranted, if clinically indicated. 6. Additional incidental findings, as above.   06/20/2021 Imaging   Bone Scan  IMPRESSION: No osseous uptake suspicious for recurrent/progressive metastatic disease.   07/03/2021 - 01/11/2022 Chemotherapy   Patient is on Treatment Plan : BREAST METASTATIC Sacituzumab govitecan-hziy (TIvette Loyalq21d     09/27/2021 Imaging   EXAM: CT CHEST, ABDOMEN, AND PELVIS WITH CONTRAST  IMPRESSION: 1. Marked interval improvement in previously demonstrated lymphadenopathy in the abdomen and pelvis. A few prominent right inguinal lymph nodes remain. 2. Stable osseous metastatic disease in the pelvis. 3. No evidence of local recurrence or other metastatic disease. 4. Stable incidental findings including cholelithiasis,  uterine fibroids and Aortic Atherosclerosis (ICD10-I70.0).  ADDENDUM: Not mentioned in the original impression is a possible nonocclusive fibrin sheath along the distal aspect of the Port-A-Cath.   09/27/2021 Imaging   EXAM: NUCLEAR MEDICINE WHOLE BODY BONE SCAN  IMPRESSION: Stable examination, without abnormal osseous uptake.   11/27/2021 Imaging   CT AP IMPRESSION: 1. Stable lytic and sclerotic lesion in the RIGHT iliac wing. 2. No evidence of visceral metastasis or adenopathy in the abdomenpelvis. 3. Leiomyomatous uterus.  ADDENDUM REPORT: 11/29/2021 09:33 Upon further review and discussion with LaCira RueP, the RIGHT inguinal lymph nodes previous described have increased in volume. For example 14 mm node (image 91/2) is increased from 9 mm. Adjacent 14 mm node on image 86 is increased from 8 mm. Additionally, LEFT common iliac lymph node is also slightly increased measuring 10 mm (46/2) compared to 7 mm on comparison exam from 09/27/2021. These sites demonstrates similar but greater lymphadenopathy on CT 06/18/2021.   02/15/2022 - 03/08/2022 Chemotherapy   Patient is on Treatment Plan : BREAST METASTATIC fam-trastuzumab deruxtecan-nxki (Enhertu) q21d     02/15/2022 - 05/10/2022 Chemotherapy   Patient is on Treatment Plan : BREAST METASTATIC Fam-Trastuzumab Deruxtecan-nxki (Enhertu) (5.4) q21d     06/07/2022 - 07/05/2022 Chemotherapy   Patient is on Treatment Plan : BREAST METASTATIC Eribulin D1,8 q21d     07/26/2022 - 07/26/2022 Chemotherapy   Patient  is on Treatment Plan : HEAD/NECK Pembrolizumab (200) q21d     08/14/2022 -  Chemotherapy   Patient is on Treatment Plan : BREAST Carboplatin + Gemcitabine D1,8 q21d     Neuropathy associated with cancer ([place])  11/21/2021 Initial Diagnosis   Neuropathy associated with cancer ([place])      INTERVAL HISTORY:  Kristin Ward is here for a follow up of metastatic breast cancer    She was last seen by me on 06/28/22 She presents  to the clinic alone. Pt reports that radiation went well, but brought out other issues. Numbness in her feet. Oxycodone and Gabapentin helps.She reports she has  some swelling in legs. Pt states SOB when mobile.    All other systems were reviewed with the patient and are negative.  MEDICAL HISTORY:  Past Medical History:  Diagnosis Date   Abnormal x-ray of lungs with single pulmonary nodule 03/15/2016   per records from [place]    Anemia    Asthma    Cholelithiasis 05/19/2017   On CT   Coronary artery calcification seen on CAT scan 05/19/2017   Diabetes mellitus without complication ([place])    type 2   Diabetes mellitus, new onset ([place]) 01/20/2019   Dilated idiopathic cardiomyopathy ([place]) 12/2015   EF 15-20%. Diagnosed in [facility]   Headache    NONE RECENT   Hypertension    Lymph nodes enlarged 07/25/2020   Malignant neoplasm of lower-outer quadrant of left breast of female, estrogen receptor negative ([place]) 06/11/2017   left breast   Mixed hyperlipidemia 06/17/2018   partially blind    Personal history of chemotherapy 2018-2019   Personal history of radiation therapy 2019   Retinitis pigmentosa    Retroperitoneal lymphadenopathy 07/25/2020   Uveitis     SURGICAL HISTORY: Past Surgical History:  Procedure Laterality Date   brain cyst removed  2007   to help with headaches per patient , aspirated    BREAST BIOPSY Left 06/06/2017   x2   BREAST CYST ASPIRATION Left 06/06/2017   BREAST LUMPECTOMY Left 06/26/2017   BREAST LUMPECTOMY WITH RADIOACTIVE SEED AND SENTINEL LYMPH NODE BIOPSY Left 06/26/2017   Procedure: BREAST LUMPECTOMY WITH RADIOACTIVE SEED AND SENTINEL LYMPH NODE BIOPSY ERAS  PATHWAY;  Surgeon: Kristin Klein, MD;  Location: [place];  Service: General;  Laterality: Left;  pec block   FINE NEEDLE ASPIRATION Left 02/23/2019   Procedure: FINE NEEDLE ASPIRATION;  Surgeon: Kristin Klein, MD;  Location: [place];  Service: General;   Laterality: Left;  Asiration of left breast seroma    IR IMAGING GUIDED PORT INSERTION  10/13/2020   NASAL TURBINATE REDUCTION     PORT-A-CATH REMOVAL Right 02/23/2019   Procedure: REMOVAL PORT-A-CATH;  Surgeon: Kristin Klein, MD;  Location: [place];  Service: General;  Laterality: Right;   PORTACATH PLACEMENT N/A 06/26/2017   Procedure: INSERTION PORT-A-CATH;  Surgeon: Kristin Klein, MD;  Location: [place];  Service: General;  Laterality: N/A;   TRANSTHORACIC ECHOCARDIOGRAM  12/2015   A) [facility] May 2017: Mild concentric LVH. Global hypokinesis. GR daily. EF 18% severe LA dilation. Mitral annular dilatation with papillary muscle dysfunction and moderate MR. Dilated IVC consistent with elevated RAP.   TRANSTHORACIC ECHOCARDIOGRAM  05/2017; 08/2017   a) Mild concentric LVH.  EF 45 to 50% with diffuse hypokinesis.  GR 1 DD.  Mild aortic root dilation.;; b)  Mild LVH EF 45 to 50%.  Diffuse HK.  No significant valve disease.  No  significant change.   TRANSTHORACIC ECHOCARDIOGRAM  01/30/2022   Pre-Enhertu:EF 35 to 40%.  Moderate dysfunction with global HK.  Abnormal strain.  Normal RV size and function.  Normal RVSP.  Aortic root estimated 42 mm.  Mild MR.  Mild/trivial AI.  Normal RVP.;  04/01/2022: Preliminary read 40%.  Formal read 45 to 50% (probably 40 to 45%).  Global HK.  GR 1 DD.  Normal RV size and RVP.  Mild MR.  Now measured at 38 mm.  Normal RAP.    I have reviewed the social history and family history with the patient and they are unchanged from previous note.  ALLERGIES:  is allergic to morphine and related, latex, tylenol with codeine #3 [acetaminophen-codeine], and penicillins.  MEDICATIONS:  Current Outpatient Medications  Medication Sig Dispense Refill   Accu-Chek FastClix Lancets MISC TEST TWICE A DAY. PT USES AN ACCU-CHEK GUIDE ME METER 102 each 2   amLODipine (NORVASC) 5 MG tablet Take 5 mg by mouth as needed. According to B/P     Ascorbic Acid  (VITAMIN C) 500 MG CAPS as directed Orally     aspirin 81 MG chewable tablet 1 tablet Orally Once a day     atorvastatin (LIPITOR) 40 MG tablet Take 1 tablet (40 mg total) by mouth daily. 90 tablet 3   brimonidine (ALPHAGAN) 0.2 % ophthalmic solution Place 1 drop into both eyes 2 (two) times daily.     carvedilol (COREG) 12.5 MG tablet TAKE 1 AND 1/2 TABLETS BY MOUTH TWICE DAILY qm 90 tablet 10   cholecalciferol (VITAMIN D3) 25 MCG (1000 UNIT) tablet Take 2,000 Units by mouth daily.     empagliflozin (JARDIANCE) 10 MG TABS tablet Take 1 tablet (10 mg total) by mouth daily before breakfast. 90 tablet 3   furosemide (LASIX) 20 MG tablet TAKE 1-2 TABLETS (40 MG TOTAL) BY MOUTH AS NEEDED FOR WEIGHT GAIN 3 TO 4 LBS or significant leg edema 60 tablet 1   gabapentin (NEURONTIN) 300 MG capsule Take 1 capsule (300 mg total) by mouth 2 (two) times daily. 30 capsule 1   HUMALOG KWIKPEN 100 UNIT/ML KwikPen Inject 3-10 Units into the skin 3 (three) times daily before meals. Per sliding scale     HYDROcodone-acetaminophen (NORCO/VICODIN) 5-325 MG tablet Take 1 tablet by mouth every 6 (six) hours as needed for moderate pain. 30 tablet 0   ibuprofen (ADVIL) 800 MG tablet Take 1 tablet (800 mg total) by mouth every 8 (eight) hours as needed. 30 tablet 2   Lancets Misc. (ACCU-CHEK SOFTCLIX LANCET DEV) KIT 1 Units by Does not apply route 2 (two) times daily. Check FSBS BID - Include strips # 50 with 5 refills, Lancets #50 with 5 refills DX: Type 2 DM - ICD 10: E11.9 1 kit 0   lidocaine-prilocaine (EMLA) cream Apply 1 application topically as needed. 30 g 0   linaclotide (LINZESS) 72 MCG capsule Take 72 mcg by mouth as needed.     magic mouthwash (nystatin, lidocaine, diphenhydrAMINE, alum & mag hydroxide) suspension Swish and swallow 5 mLs by mouth 4 (four) times daily as needed for mouth pain 140 mL 0   Multiple Vitamins-Minerals (WOMENS MULTI PO) Take 15 mLs by mouth daily.     ondansetron (ZOFRAN) 8 MG tablet Take  1 tablet (8 mg total) by mouth every 8 (eight) hours as needed for nausea or vomiting (begin on day 3 after chemo). 30 tablet 1   oxyCODONE (OXY IR/ROXICODONE) 5 MG immediate release tablet Take 1  tablet (5 mg total) by mouth every 8 (eight) hours as needed for severe pain. 90 tablet 0   pantoprazole (PROTONIX) 40 MG tablet TAKE 1 TABLET BY MOUTH EVERY DAY 90 tablet 1   prochlorperazine (COMPAZINE) 10 MG tablet Take 1 tablet (10 mg total) by mouth every 6 (six) hours as needed (Nausea or vomiting). 30 tablet 1   promethazine (PHENERGAN) 25 MG tablet Take 1 tablet (25 mg total) by mouth every 6 (six) hours as needed for nausea or vomiting. 30 tablet 0   sacubitril-valsartan (ENTRESTO) 97-103 MG Take 1 tablet by mouth 2 (two) times daily. 60 tablet 10   spironolactone (ALDACTONE) 25 MG tablet Take 1 tablet (25 mg total) by mouth daily. 30 tablet 10   vitamin B-12 (CYANOCOBALAMIN) 1000 MCG tablet Take 1,000 mcg by mouth daily.     zinc gluconate 50 MG tablet Take 50 mg by mouth daily.     Current Facility-Administered Medications  Medication Dose Route Frequency Provider Last Rate Last Admin   insulin glulisine (APIDRA) injection 6 Units  6 Units Subcutaneous Once Francis Gaines B, PA-C        PHYSICAL EXAMINATION: ECOG PERFORMANCE STATUS: 2 - Symptomatic, <50% confined to bed  Vitals:   07/26/22 1439  BP: 134/77  Pulse: 75  Resp: 16  Temp: 98.2 F (36.8 C)  SpO2: 99%   Wt Readings from Last 3 Encounters:  07/26/22 231 lb 12.8 oz (105.1 kg)  07/05/22 221 lb 8 oz (100.5 kg)  07/01/22 221 lb 6.4 oz (100.4 kg)     GENERAL:alert, no distress and comfortable SKIN: skin color normal, no rashes or significant lesions EYES: normal, Conjunctiva are pink and non-injected, sclera clear  NEURO: alert & oriented x 3 with fluent speech LABORATORY DATA:  I have reviewed the data as listed    Latest Ref Rng & Units 07/05/2022   11:06 AM 06/28/2022    8:59 AM 06/14/2022    8:51 AM  CBC  WBC  4.0 - 10.5 K/uL 2.7  2.5  3.0   Hemoglobin 12.0 - 15.0 g/dL 9.9  10.1  10.1   Hematocrit 36.0 - 46.0 % 30.0  30.8  30.4   Platelets 150 - 400 K/uL 191  309  312         Latest Ref Rng & Units 07/05/2022   11:06 AM 06/28/2022    8:59 AM 06/14/2022    8:51 AM  CMP  Glucose 70 - 99 mg/dL 245  108  128   BUN 8 - 23 mg/dL _0 Creatinine 0.44 - 1.00 mg/dL 0.69  0.52  0.63   Sodium 135 - 145 mmol/L 139  142  143   Potassium 3.5 - 5.1 mmol/L 3.6  4.4  3.5   Chloride 98 - 111 mmol/L 108  111  109   CO2 22 - 32 mmol/L _1 Calcium 8.9 - 10.3 mg/dL 9.8  8.7  9.4   Total Protein 6.5 - 8.1 g/dL 7.1  7.3  7.6   Total Bilirubin 0.3 - 1.2 mg/dL 0.6  0.5  0.5   Alkaline Phos 38 - 126 U/L 72  84  97   AST 15 - 41 U/L 26  25  41   ALT 0 - 44 U/L _2 RADIOGRAPHIC STUDIES: I have personally reviewed the radiological images as listed and agreed with the findings in  the report. No results found.    Orders Placed This Encounter  Procedures   CT CHEST ABDOMEN PELVIS W CONTRAST    Standing Status:   Future    Standing Expiration Date:   07/27/2023    Order Specific Question:   Preferred imaging location?    Answer:   [facility]    Order Specific Question:   Release to patient    Answer:   Immediate    Order Specific Question:   Is Oral Contrast requested for this exam?    Answer:   Yes, Per Radiology protocol   All questions were answered. The patient knows to call the clinic with any problems, questions or concerns. No barriers to learning was detected. The total time spent in the appointment was 40 minutes.     Kristin Merle, MD 07/26/2022   Felicity Coyer, CMA, am acting as scribe for Kristin Merle, MD.   I have reviewed the above documentation for accuracy and completeness, and I agree with the above.

## 2022-07-26 ENCOUNTER — Encounter: Payer: Self-pay | Admitting: Hematology

## 2022-07-26 ENCOUNTER — Ambulatory Visit: Payer: Medicare Other

## 2022-07-26 ENCOUNTER — Ambulatory Visit: Payer: Medicare Other | Admitting: Hematology

## 2022-07-26 ENCOUNTER — Other Ambulatory Visit: Payer: Medicare Other

## 2022-07-26 ENCOUNTER — Inpatient Hospital Stay: Payer: Medicare Other | Attending: Hematology | Admitting: Hematology

## 2022-07-26 VITALS — BP 134/77 | HR 75 | Temp 98.2°F | Resp 16 | Wt 231.8 lb

## 2022-07-26 DIAGNOSIS — G620 Drug-induced polyneuropathy: Secondary | ICD-10-CM | POA: Diagnosis not present

## 2022-07-26 DIAGNOSIS — E1142 Type 2 diabetes mellitus with diabetic polyneuropathy: Secondary | ICD-10-CM | POA: Diagnosis not present

## 2022-07-26 DIAGNOSIS — C801 Malignant (primary) neoplasm, unspecified: Secondary | ICD-10-CM

## 2022-07-26 DIAGNOSIS — G63 Polyneuropathy in diseases classified elsewhere: Secondary | ICD-10-CM

## 2022-07-26 DIAGNOSIS — Z7189 Other specified counseling: Secondary | ICD-10-CM | POA: Diagnosis not present

## 2022-07-26 DIAGNOSIS — Z171 Estrogen receptor negative status [ER-]: Secondary | ICD-10-CM

## 2022-07-26 DIAGNOSIS — Z170 Estrogen receptor positive status [ER+]: Secondary | ICD-10-CM | POA: Diagnosis not present

## 2022-07-26 DIAGNOSIS — C50512 Malignant neoplasm of lower-outer quadrant of left female breast: Secondary | ICD-10-CM | POA: Diagnosis not present

## 2022-07-26 DIAGNOSIS — C7951 Secondary malignant neoplasm of bone: Secondary | ICD-10-CM

## 2022-07-26 DIAGNOSIS — T451X5A Adverse effect of antineoplastic and immunosuppressive drugs, initial encounter: Secondary | ICD-10-CM | POA: Diagnosis not present

## 2022-07-26 DIAGNOSIS — G893 Neoplasm related pain (acute) (chronic): Secondary | ICD-10-CM | POA: Diagnosis not present

## 2022-07-26 DIAGNOSIS — C7989 Secondary malignant neoplasm of other specified sites: Secondary | ICD-10-CM | POA: Insufficient documentation

## 2022-07-26 MED ORDER — FUROSEMIDE 20 MG PO TABS: ORAL_TABLET | ORAL | 1 refills | Status: DC

## 2022-07-26 MED ORDER — OXYCODONE HCL 5 MG PO TABS: 5.0000 mg | ORAL_TABLET | Freq: Three times a day (TID) | ORAL | 0 refills | Status: DC | PRN

## 2022-07-26 NOTE — Progress Notes (Signed)
ON PATHWAY REGIMEN - Breast  No Change  Continue With Treatment as Ordered.  Original Decision Date/Time: 06/06/2022 22:05     A cycle is every 21 days:     Eribulin mesylate   **Always confirm dose/schedule in your pharmacy ordering system**  Patient Characteristics: Distant Metastases or Locoregional Recurrent Disease - Unresected or Locally Advanced Unresectable Disease Progressing after Neoadjuvant and Local Therapies, ER Negative, Chemotherapy, HER2 Low, Third Line and Beyond, Eribulin Candidate Therapeutic Status: Distant Metastases HER2 Status: Low ER Status: Negative (-) PR Status: Negative (-) Therapy Approach Indicated: Standard Chemotherapy/Endocrine Therapy Line of Therapy: Third Line and Beyond Intent of Therapy: Non-Curative / Palliative Intent, Discussed with Patient

## 2022-07-26 NOTE — Assessment & Plan Note (Signed)
-  S/p C2D8 Taxol she developed mild tingling in her hands and feet. Taxol stopped after 12/18/20.  -worsening with chemo

## 2022-07-26 NOTE — Progress Notes (Signed)
DISCONTINUE ON PATHWAY REGIMEN - Breast     A cycle is every 21 days:     Eribulin mesylate   **Always confirm dose/schedule in your pharmacy ordering system**  REASON: Toxicities / Adverse Event PRIOR TREATMENT: [id number]: Eribulin 1.4 mg/m2 D1, 8 q21 Days TREATMENT RESPONSE: Unable to Evaluate  START ON PATHWAY REGIMEN - Breast     A cycle is every 21 days:     Gemcitabine   **Always confirm dose/schedule in your pharmacy ordering system**  Patient Characteristics: Distant Metastases or Locoregional Recurrent Disease - Unresected or Locally Advanced Unresectable Disease Progressing after Neoadjuvant and Local Therapies, ER Negative, Chemotherapy, HER2 Low, Third Line and Beyond, Prior or Contraindicated  Anthracycline and Prior or Contraindicated Eribulin Therapeutic Status: Distant Metastases HER2 Status: Low ER Status: Negative (-) PR Status: Negative (-) Therapy Approach Indicated: Standard Chemotherapy/Endocrine Therapy Line of Therapy: Third Line and Beyond Intent of Therapy: Non-Curative / Palliative Intent, Discussed with Patient

## 2022-07-26 NOTE — Assessment & Plan Note (Signed)
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 S/p palliative radiation to right iliac 09/04/20 - 09/22/20 with Dr. Lisbeth Renshaw. -Given localized bone met, she started Zometa q91month on 11/20/20

## 2022-07-26 NOTE — Assessment & Plan Note (Addendum)
stage IB, p(T1cN0M0), Triple negative, Grade 3, in 2018, metastatic disease in 08/2020 ER 20% weakly + PR/HER2 (0) negative, PD-L1 0%, genetics (-), HER2 1+ on node biopsy  -diagnosed with triple negative breast cancer in 05/2017. S/p left breast lumpectomy, adjuvant chemo TC and Radiation.  -biopsy of right gluteus on 09/02/20 due to new pain showed metastatic carcinoma, consistent with breast primary. ER 20% weakly positive, PR and HER2 negative.  -s/p multiple line chemo, including taxol, xeloda, Trodelvy, enhertu -currently on [place], with worsening neuropathy, will stop it. -Discussed at the treatment options.  I recommend weekly carboplatin and gemcitabine, potential benefit and side effects discussed with her, she agrees to proceed.  Will start after Christmas. 

## 2022-07-26 NOTE — Assessment & Plan Note (Signed)
-  received multiple palliative RT -on oxycodone

## 2022-07-26 NOTE — Assessment & Plan Note (Signed)
-  Secondary to diabetes and chemotherapy -Much worse lately, it has impacted her ability to walk on distance. -Refilled her oxycodone, she will continue gabapentin -Palliative care referral

## 2022-07-29 ENCOUNTER — Telehealth: Payer: Self-pay | Admitting: Hematology

## 2022-07-29 NOTE — Telephone Encounter (Signed)
Called patient to notify of upcoming appointments. Left voicemail with appointment information

## 2022-07-31 ENCOUNTER — Other Ambulatory Visit: Payer: Self-pay

## 2022-07-31 ENCOUNTER — Encounter (HOSPITAL_COMMUNITY)
Admission: RE | Disposition: A | Discharge status: Home / Self Care | Payer: Self-pay | Source: Home / Self Care | Attending: Internal Medicine

## 2022-07-31 ENCOUNTER — Ambulatory Visit (HOSPITAL_COMMUNITY)
Admission: RE | Admit: 2022-07-31 | Discharge: 2022-07-31 | Disposition: A | Discharge status: Home / Self Care | Payer: Medicare Other | Attending: Internal Medicine | Admitting: Internal Medicine

## 2022-07-31 DIAGNOSIS — E1142 Type 2 diabetes mellitus with diabetic polyneuropathy: Secondary | ICD-10-CM | POA: Insufficient documentation

## 2022-07-31 DIAGNOSIS — H547 Unspecified visual loss: Secondary | ICD-10-CM | POA: Insufficient documentation

## 2022-07-31 DIAGNOSIS — I2510 Atherosclerotic heart disease of native coronary artery without angina pectoris: Secondary | ICD-10-CM | POA: Insufficient documentation

## 2022-07-31 DIAGNOSIS — Z794 Long term (current) use of insulin: Secondary | ICD-10-CM | POA: Diagnosis not present

## 2022-07-31 DIAGNOSIS — I420 Dilated cardiomyopathy: Secondary | ICD-10-CM | POA: Insufficient documentation

## 2022-07-31 DIAGNOSIS — Z79899 Other long term (current) drug therapy: Secondary | ICD-10-CM | POA: Insufficient documentation

## 2022-07-31 DIAGNOSIS — I5042 Chronic combined systolic (congestive) and diastolic (congestive) heart failure: Secondary | ICD-10-CM

## 2022-07-31 DIAGNOSIS — I5022 Chronic systolic (congestive) heart failure: Secondary | ICD-10-CM | POA: Diagnosis not present

## 2022-07-31 DIAGNOSIS — Z87891 Personal history of nicotine dependence: Secondary | ICD-10-CM | POA: Diagnosis not present

## 2022-07-31 DIAGNOSIS — I110 Hypertensive heart disease with heart failure: Secondary | ICD-10-CM | POA: Insufficient documentation

## 2022-07-31 DIAGNOSIS — C50912 Malignant neoplasm of unspecified site of left female breast: Secondary | ICD-10-CM | POA: Diagnosis not present

## 2022-07-31 HISTORY — PX: RIGHT/LEFT HEART CATH AND CORONARY ANGIOGRAPHY: CATH118266

## 2022-07-31 LAB — POCT I-STAT EG7
Acid-base deficit: 1 mmol/L (ref 0.0–2.0)
Acid-base deficit: 3 mmol/L — ABNORMAL HIGH (ref 0.0–2.0)
Acid-base deficit: 4 mmol/L — ABNORMAL HIGH (ref 0.0–2.0)
Bicarbonate: 20.6 mmol/L (ref 20.0–28.0)
Bicarbonate: 22.5 mmol/L (ref 20.0–28.0)
Bicarbonate: 24.4 mmol/L (ref 20.0–28.0)
Calcium, Ion: 0.94 mmol/L — ABNORMAL LOW (ref 1.15–1.40)
Calcium, Ion: 1.09 mmol/L — ABNORMAL LOW (ref 1.15–1.40)
Calcium, Ion: 1.29 mmol/L (ref 1.15–1.40)
HCT: 24 % — ABNORMAL LOW (ref 36.0–46.0)
HCT: 26 % — ABNORMAL LOW (ref 36.0–46.0)
HCT: 29 % — ABNORMAL LOW (ref 36.0–46.0)
Hemoglobin: 8.2 g/dL — ABNORMAL LOW (ref 12.0–15.0)
Hemoglobin: 8.8 g/dL — ABNORMAL LOW (ref 12.0–15.0)
Hemoglobin: 9.9 g/dL — ABNORMAL LOW (ref 12.0–15.0)
O2 Saturation: 75 %
O2 Saturation: 76 %
O2 Saturation: 76 %
Potassium: 3.2 mmol/L — ABNORMAL LOW (ref 3.5–5.1)
Potassium: 3.5 mmol/L (ref 3.5–5.1)
Potassium: 4.1 mmol/L (ref 3.5–5.1)
Sodium: 142 mmol/L (ref 135–145)
Sodium: 146 mmol/L — ABNORMAL HIGH (ref 135–145)
Sodium: 149 mmol/L — ABNORMAL HIGH (ref 135–145)
TCO2: 22 mmol/L (ref 22–32)
TCO2: 24 mmol/L (ref 22–32)
TCO2: 26 mmol/L (ref 22–32)
pCO2, Ven: 36.5 mmHg — ABNORMAL LOW (ref 44–60)
pCO2, Ven: 41.3 mmHg — ABNORMAL LOW (ref 44–60)
pCO2, Ven: 42.2 mmHg — ABNORMAL LOW (ref 44–60)
pH, Ven: 7.344 (ref 7.25–7.43)
pH, Ven: 7.36 (ref 7.25–7.43)
pH, Ven: 7.37 (ref 7.25–7.43)
pO2, Ven: 41 mmHg (ref 32–45)
pO2, Ven: 42 mmHg (ref 32–45)
pO2, Ven: 43 mmHg (ref 32–45)

## 2022-07-31 LAB — POCT I-STAT 7, (LYTES, BLD GAS, ICA,H+H)
Acid-base deficit: 3 mmol/L — ABNORMAL HIGH (ref 0.0–2.0)
Bicarbonate: 21.4 mmol/L (ref 20.0–28.0)
Calcium, Ion: 1.12 mmol/L — ABNORMAL LOW (ref 1.15–1.40)
HCT: 27 % — ABNORMAL LOW (ref 36.0–46.0)
Hemoglobin: 9.2 g/dL — ABNORMAL LOW (ref 12.0–15.0)
O2 Saturation: 98 %
Potassium: 3.8 mmol/L (ref 3.5–5.1)
Sodium: 145 mmol/L (ref 135–145)
TCO2: 22 mmol/L (ref 22–32)
pCO2 arterial: 33.5 mmHg (ref 32–48)
pH, Arterial: 7.415 (ref 7.35–7.45)
pO2, Arterial: 101 mmHg (ref 83–108)

## 2022-07-31 LAB — GLUCOSE, CAPILLARY: Glucose-Capillary: 157 mg/dL — ABNORMAL HIGH (ref 70–99)

## 2022-07-31 SURGERY — RIGHT/LEFT HEART CATH AND CORONARY ANGIOGRAPHY
Anesthesia: LOCAL

## 2022-07-31 MED ORDER — FENTANYL CITRATE (PF) 100 MCG/2ML IJ SOLN
INTRAMUSCULAR | Status: AC
  Filled 2022-07-31: qty 2

## 2022-07-31 MED ORDER — SODIUM CHLORIDE 0.9 % IV SOLN: 250.0000 mL | INTRAVENOUS | Status: DC | PRN

## 2022-07-31 MED ORDER — SODIUM CHLORIDE 0.9 % IV SOLN: INTRAVENOUS | Status: DC

## 2022-07-31 MED ORDER — VERAPAMIL HCL 2.5 MG/ML IV SOLN
INTRAVENOUS | Status: DC | PRN
  Administered 2022-07-31 (×2): 10 mL via INTRA_ARTERIAL

## 2022-07-31 MED ORDER — LIDOCAINE HCL (PF) 1 % IJ SOLN
INTRAMUSCULAR | Status: DC | PRN
  Administered 2022-07-31: 5 mL

## 2022-07-31 MED ORDER — LIDOCAINE HCL (PF) 1 % IJ SOLN
INTRAMUSCULAR | Status: AC
  Filled 2022-07-31: qty 30

## 2022-07-31 MED ORDER — HEPARIN (PORCINE) IN NACL 1000-0.9 UT/500ML-% IV SOLN
INTRAVENOUS | Status: AC
  Filled 2022-07-31: qty 1000

## 2022-07-31 MED ORDER — HEPARIN (PORCINE) IN NACL 1000-0.9 UT/500ML-% IV SOLN
INTRAVENOUS | Status: DC | PRN
  Administered 2022-07-31 (×2): 500 mL

## 2022-07-31 MED ORDER — FENTANYL CITRATE (PF) 100 MCG/2ML IJ SOLN
INTRAMUSCULAR | Status: DC | PRN
  Administered 2022-07-31 (×2): 25 ug via INTRAVENOUS

## 2022-07-31 MED ORDER — MIDAZOLAM HCL 2 MG/2ML IJ SOLN
INTRAMUSCULAR | Status: AC
  Filled 2022-07-31: qty 2

## 2022-07-31 MED ORDER — HEPARIN SODIUM (PORCINE) 1000 UNIT/ML IJ SOLN
INTRAMUSCULAR | Status: AC
  Filled 2022-07-31: qty 10

## 2022-07-31 MED ORDER — SODIUM CHLORIDE 0.9% FLUSH: 3.0000 mL | Freq: Two times a day (BID) | INTRAVENOUS | Status: DC

## 2022-07-31 MED ORDER — VERAPAMIL HCL 2.5 MG/ML IV SOLN
INTRAVENOUS | Status: AC
  Filled 2022-07-31: qty 2

## 2022-07-31 MED ORDER — LABETALOL HCL 5 MG/ML IV SOLN: 10.0000 mg | INTRAVENOUS | Status: DC | PRN

## 2022-07-31 MED ORDER — ACETAMINOPHEN 325 MG PO TABS: 650.0000 mg | ORAL_TABLET | ORAL | Status: DC | PRN

## 2022-07-31 MED ORDER — ONDANSETRON HCL 4 MG/2ML IJ SOLN: 4.0000 mg | Freq: Four times a day (QID) | INTRAMUSCULAR | Status: DC | PRN

## 2022-07-31 MED ORDER — SODIUM CHLORIDE 0.9% FLUSH: 3.0000 mL | INTRAVENOUS | Status: DC | PRN

## 2022-07-31 MED ORDER — IOHEXOL 350 MG/ML SOLN
INTRAVENOUS | Status: DC | PRN
  Administered 2022-07-31: 100 mL

## 2022-07-31 MED ORDER — HYDRALAZINE HCL 20 MG/ML IJ SOLN: 10.0000 mg | INTRAMUSCULAR | Status: DC | PRN

## 2022-07-31 MED ORDER — ASPIRIN 81 MG PO CHEW
81.0000 mg | CHEWABLE_TABLET | Freq: Once | ORAL | Status: AC
  Administered 2022-07-31: 81 mg via ORAL
  Filled 2022-07-31: qty 1

## 2022-07-31 MED ORDER — HEPARIN SODIUM (PORCINE) 1000 UNIT/ML IJ SOLN
INTRAMUSCULAR | Status: DC | PRN
  Administered 2022-07-31: 5000 [IU] via INTRAVENOUS

## 2022-07-31 MED ORDER — MIDAZOLAM HCL 2 MG/2ML IJ SOLN
INTRAMUSCULAR | Status: DC | PRN
  Administered 2022-07-31 (×2): 1 mg via INTRAVENOUS

## 2022-07-31 SURGICAL SUPPLY — 13 items
CATH 5FR JL3.5 JR4 ANG PIG MP (CATHETERS) IMPLANT
CATH INFINITI 5 FR 3DRC (CATHETERS) IMPLANT
CATH INFINITI 5 FR AR1 MOD (CATHETERS) IMPLANT
CATH INFINITI 5 FR LCB (CATHETERS) IMPLANT
CATH INFINITI 5FR AL1 (CATHETERS) IMPLANT
CATH SWAN GANZ 7F STRAIGHT (CATHETERS) IMPLANT
DEVICE RAD COMP TR BAND LRG (VASCULAR PRODUCTS) IMPLANT
GLIDESHEATH SLEND SS 6F .021 (SHEATH) IMPLANT
GLIDESHEATH SLENDER 7FR .021G (SHEATH) IMPLANT
GUIDEWIRE INQWIRE 1.5J.035X260 (WIRE) IMPLANT
INQWIRE 1.5J .035X260CM (WIRE) ×1
PACK CARDIAC CATHETERIZATION (CUSTOM PROCEDURE TRAY) ×1 IMPLANT
TRANSDUCER W/STOPCOCK (MISCELLANEOUS) ×1 IMPLANT

## 2022-07-31 NOTE — Progress Notes (Signed)
On arrival from cath lab, barbeau test level D; and 2 cc withdrawn and test level B after air removed

## 2022-07-31 NOTE — Interval H&P Note (Signed)
History and Physical Interval Note:  07/31/2022 8:47 AM  Kristin Ward  has presented today for surgery, with the diagnosis of heart failure.  The various methods of treatment have been discussed with the patient and family. After consideration of risks, benefits and other options for treatment, the patient has consented to  Procedure(s): RIGHT/LEFT HEART CATH AND CORONARY ANGIOGRAPHY (N/A) and possible coronary angioplasty as a surgical intervention.  The patient's history has been reviewed, patient examined, no change in status, stable for surgery.  I have reviewed the patient's chart and labs.  Questions were answered to the patient's satisfaction.     [name]

## 2022-08-01 ENCOUNTER — Encounter (HOSPITAL_COMMUNITY): Payer: Self-pay | Admitting: Internal Medicine

## 2022-08-02 ENCOUNTER — Other Ambulatory Visit: Payer: Medicare Other

## 2022-08-02 ENCOUNTER — Ambulatory Visit: Payer: Medicare Other

## 2022-08-07 ENCOUNTER — Other Ambulatory Visit: Payer: Self-pay | Admitting: Hematology

## 2022-08-07 ENCOUNTER — Ambulatory Visit (INDEPENDENT_AMBULATORY_CARE_PROVIDER_SITE_OTHER): Payer: Medicare Other | Admitting: Family Medicine

## 2022-08-07 ENCOUNTER — Encounter: Payer: Self-pay | Admitting: Family Medicine

## 2022-08-07 VITALS — BP 126/80 | HR 75 | Resp 20 | Wt 221.0 lb

## 2022-08-07 DIAGNOSIS — C50919 Malignant neoplasm of unspecified site of unspecified female breast: Secondary | ICD-10-CM | POA: Diagnosis not present

## 2022-08-07 DIAGNOSIS — E782 Mixed hyperlipidemia: Secondary | ICD-10-CM | POA: Diagnosis not present

## 2022-08-07 DIAGNOSIS — E1165 Type 2 diabetes mellitus with hyperglycemia: Secondary | ICD-10-CM

## 2022-08-07 DIAGNOSIS — H543 Unqualified visual loss, both eyes: Secondary | ICD-10-CM | POA: Diagnosis not present

## 2022-08-07 DIAGNOSIS — L299 Pruritus, unspecified: Secondary | ICD-10-CM

## 2022-08-07 NOTE — Progress Notes (Signed)
   Subjective:    Patient ID: Kristin Ward, female    DOB: [birth year], 66 y.o.   MRN: [id number]  HPI She is here for consult concerning itching to the anterior chest.  That she thinks is from the rash.  She cannot see it due to her blindness.  She recently has been getting radiation to metastatic areas in the pelvic area.  She is being followed by Dr. Debbora Presto for her diabetes.  Also needs to have follow-up on her lipids as her statin drug was raised in September.   Review of Systems     Objective:   Physical Exam Alert and in no distress.  Exam of the anterior chest shows no lesions.       Assessment & Plan:  Prur[place]  Mixed hyperlipidemia - Plan: Lipid panel  Uncontrolled type 2 diabetes mellitus with hyperglycemia (HCC)  Blind in both eyes  Primary malignant neoplasm of breast with metastasis to other site, unspecified laterality ([place]) Recommend cool compresses and cortisone cream to the anterior chest area and use Benadryl if no improvement from topical medication.  Will also check her lipid status.  She will follow-up with her endocrinologist concerning the diabetes and continue to be followed by oncology as well as radiation oncology.

## 2022-08-07 NOTE — Patient Instructions (Signed)
Very subtle tell use cortisone cream sparingly as needed for the itching and also cold rags to the area that itch the most.May also use benadryl

## 2022-08-08 LAB — LIPID PANEL
Chol/HDL Ratio: 4 ratio (ref 0.0–4.4)
Cholesterol, Total: 208 mg/dL — ABNORMAL HIGH (ref 100–199)
HDL: 52 mg/dL (ref >39)
LDL Chol Calc (NIH): 135 mg/dL — ABNORMAL HIGH (ref 0–99)
Triglycerides: 120 mg/dL (ref 0–149)
VLDL Cholesterol Cal: 21 mg/dL (ref 5–40)

## 2022-08-08 NOTE — Telephone Encounter (Signed)
LM

## 2022-08-08 NOTE — Addendum Note (Signed)
Encounter addended by: Stevenson Clinch on: 08/08/2022 8:54 AM  Actions taken: Imaging Exam ended

## 2022-08-09 ENCOUNTER — Ambulatory Visit (INDEPENDENT_AMBULATORY_CARE_PROVIDER_SITE_OTHER): Payer: Medicare Other

## 2022-08-09 VITALS — Ht 67.25 in | Wt 233.0 lb

## 2022-08-09 DIAGNOSIS — H2513 Age-related nuclear cataract, bilateral: Secondary | ICD-10-CM | POA: Diagnosis not present

## 2022-08-09 DIAGNOSIS — Z0000 Encounter for general adult medical examination without abnormal findings: Secondary | ICD-10-CM

## 2022-08-09 DIAGNOSIS — H209 Unspecified iridocyclitis: Secondary | ICD-10-CM | POA: Diagnosis not present

## 2022-08-09 DIAGNOSIS — H3552 Pigmentary retinal dystrophy: Secondary | ICD-10-CM | POA: Diagnosis not present

## 2022-08-09 DIAGNOSIS — H401131 Primary open-angle glaucoma, bilateral, mild stage: Secondary | ICD-10-CM | POA: Diagnosis not present

## 2022-08-09 DIAGNOSIS — H5010 Unspecified exotropia: Secondary | ICD-10-CM | POA: Diagnosis not present

## 2022-08-09 DIAGNOSIS — H179 Unspecified corneal scar and opacity: Secondary | ICD-10-CM | POA: Diagnosis not present

## 2022-08-09 DIAGNOSIS — E119 Type 2 diabetes mellitus without complications: Secondary | ICD-10-CM | POA: Diagnosis not present

## 2022-08-09 NOTE — Patient Instructions (Signed)
Kristin Ward , Thank you for taking time to come for your Medicare Wellness Visit. I appreciate your ongoing commitment to your health goals. Please review the following plan we discussed and let me know if I can assist you in the future.   These are the goals we discussed:  Goals      Patient Stated     08/09/2022, wants to get rid of cancer        This is a list of the screening recommended for you and due dates:  Health Maintenance  Topic Date Due   DTaP/Tdap/Td vaccine (1 - Tdap) Never done   Zoster (Shingles) Vaccine (1 of 2) Never done   Yearly kidney health urinalysis for diabetes  12/17/2019   Hemoglobin A1C  04/14/2022   Mammogram  09/13/2022*   Eye exam for diabetics  09/19/2022   Complete foot exam   02/05/2023   Colon Cancer Screening  06/10/2023   Yearly kidney function blood test for diabetes  07/06/2023   Medicare Annual Wellness Visit  08/10/2023   Pap Smear  04/07/2025   Hepatitis C Screening: USPSTF Recommendation to screen - Ages 18-79 yo.  Completed   HIV Screening  Completed   HPV Vaccine  Aged Out   Flu Shot  Discontinued   COVID-19 Vaccine  Discontinued  *Topic was postponed. The date shown is not the original due date.    Advanced directives: copy in chart  Conditions/risks identified: none  Next appointment: Follow up in one year for your annual wellness visit.   Preventive Care 40-64 Years, Female Preventive care refers to lifestyle choices and visits with your health care provider that can promote health and wellness. What does preventive care include? A yearly physical exam. This is also called an annual well check. Dental exams once or twice a year. Routine eye exams. Ask your health care provider how often you should have your eyes checked. Personal lifestyle choices, including: Daily care of your teeth and gums. Regular physical activity. Eating a healthy diet. Avoiding tobacco and drug use. Limiting alcohol use. Practicing safe  sex. Taking low-dose aspirin daily starting at age 75. Taking vitamin and mineral supplements as recommended by your health care provider. What happens during an annual well check? The services and screenings done by your health care provider during your annual well check will depend on your age, overall health, lifestyle risk factors, and family history of disease. Counseling  Your health care provider may ask you questions about your: Alcohol use. Tobacco use. Drug use. Emotional well-being. Home and relationship well-being. Sexual activity. Eating habits. Work and work Statistician. Method of birth control. Menstrual cycle. Pregnancy history. Screening  You may have the following tests or measurements: Height, weight, and BMI. Blood pressure. Lipid and cholesterol levels. These may be checked every 5 years, or more frequently if you are over 69 years old. Skin check. Lung cancer screening. You may have this screening every year starting at age 11 if you have a 30-pack-year history of smoking and currently smoke or have quit within the past 15 years. Fecal occult blood test (FOBT) of the stool. You may have this test every year starting at age 48. Flexible sigmoidoscopy or colonoscopy. You may have a sigmoidoscopy every 5 years or a colonoscopy every 10 years starting at age 34. Hepatitis C blood test. Hepatitis B blood test. Sexually transmitted disease (STD) testing. Diabetes screening. This is done by checking your blood sugar (glucose) after you have not eaten for  a while (fasting). You may have this done every 1-3 years. Mammogram. This may be done every 1-2 years. Talk to your health care provider about when you should start having regular mammograms. This may depend on whether you have a family history of breast cancer. BRCA-related cancer screening. This may be done if you have a family history of breast, ovarian, tubal, or peritoneal cancers. Pelvic exam and Pap test. This  may be done every 3 years starting at age 44. Starting at age 52, this may be done every 5 years if you have a Pap test in combination with an HPV test. Bone density scan. This is done to screen for osteoporosis. You may have this scan if you are at high risk for osteoporosis. Discuss your test results, treatment options, and if necessary, the need for more tests with your health care provider. Vaccines  Your health care provider may recommend certain vaccines, such as: Influenza vaccine. This is recommended every year. Tetanus, diphtheria, and acellular pertussis (Tdap, Td) vaccine. You may need a Td booster every 10 years. Zoster vaccine. You may need this after age 41. Pneumococcal 13-valent conjugate (PCV13) vaccine. You may need this if you have certain conditions and were not previously vaccinated. Pneumococcal polysaccharide (PPSV23) vaccine. You may need one or two doses if you smoke cigarettes or if you have certain conditions. Talk to your health care provider about which screenings and vaccines you need and how often you need them. This information is not intended to replace advice given to you by your health care provider. Make sure you discuss any questions you have with your health care provider. Document Released: 08/25/2015 Document Revised: 04/17/2016 Document Reviewed: 05/30/2015 Elsevier Interactive Patient Education  2017 [place] Prevention in the Home Falls can cause injuries. They can happen to people of all ages. There are many things you can do to make your home safe and to help prevent falls. What can I do on the outside of my home? Regularly fix the edges of walkways and driveways and fix any cracks. Remove anything that might make you trip as you walk through a door, such as a raised step or threshold. Trim any bushes or trees on the path to your home. Use bright outdoor lighting. Clear any walking paths of anything that might make someone trip, such  as rocks or tools. Regularly check to see if handrails are loose or broken. Make sure that both sides of any steps have handrails. Any raised decks and porches should have guardrails on the edges. Have any leaves, snow, or ice cleared regularly. Use sand or salt on walking paths during winter. Clean up any spills in your garage right away. This includes oil or grease spills. What can I do in the bathroom? Use night lights. Install grab bars by the toilet and in the tub and shower. Do not use towel bars as grab bars. Use non-skid mats or decals in the tub or shower. If you need to sit down in the shower, use a plastic, non-slip stool. Keep the floor dry. Clean up any water that spills on the floor as soon as it happens. Remove soap buildup in the tub or shower regularly. Attach bath mats securely with double-sided non-slip rug tape. Do not have throw rugs and other things on the floor that can make you trip. What can I do in the bedroom? Use night lights. Make sure that you have a light by your bed that is  easy to reach. Do not use any sheets or blankets that are too big for your bed. They should not hang down onto the floor. Have a firm chair that has side arms. You can use this for support while you get dressed. Do not have throw rugs and other things on the floor that can make you trip. What can I do in the kitchen? Clean up any spills right away. Avoid walking on wet floors. Keep items that you use a lot in easy-to-reach places. If you need to reach something above you, use a strong step stool that has a grab bar. Keep electrical cords out of the way. Do not use floor polish or wax that makes floors slippery. If you must use wax, use non-skid floor wax. Do not have throw rugs and other things on the floor that can make you trip. What can I do with my stairs? Do not leave any items on the stairs. Make sure that there are handrails on both sides of the stairs and use them. Fix  handrails that are broken or loose. Make sure that handrails are as long as the stairways. Check any carpeting to make sure that it is firmly attached to the stairs. Fix any carpet that is loose or worn. Avoid having throw rugs at the top or bottom of the stairs. If you do have throw rugs, attach them to the floor with carpet tape. Make sure that you have a light switch at the top of the stairs and the bottom of the stairs. If you do not have them, ask someone to add them for you. What else can I do to help prevent falls? Wear shoes that: Do not have high heels. Have rubber bottoms. Are comfortable and fit you well. Are closed at the toe. Do not wear sandals. If you use a stepladder: Make sure that it is fully opened. Do not climb a closed stepladder. Make sure that both sides of the stepladder are locked into place. Ask someone to hold it for you, if possible. Clearly mark and make sure that you can see: Any grab bars or handrails. First and last steps. Where the edge of each step is. Use tools that help you move around (mobility aids) if they are needed. These include: Canes. Walkers. Scooters. Crutches. Turn on the lights when you go into a dark area. Replace any light bulbs as soon as they burn out. Set up your furniture so you have a clear path. Avoid moving your furniture around. If any of your floors are uneven, fix them. If there are any pets around you, be aware of where they are. Review your medicines with your doctor. Some medicines can make you feel dizzy. This can increase your chance of falling. Ask your doctor what other things that you can do to help prevent falls. This information is not intended to replace advice given to you by your health care provider. Make sure you discuss any questions you have with your health care provider. Document Released: 05/25/2009 Document Revised: 01/04/2016 Document Reviewed: 09/02/2014 Elsevier Interactive Patient Education  2017  [employer].

## 2022-08-09 NOTE — Progress Notes (Signed)
I connected with Kristin Ward today by telephone and verified that I am speaking with the correct person using two identifiers. Location patient: home Location provider: work Persons participating in the virtual visit: Michol, Emory LPN.   I discussed the limitations, risks, security and privacy concerns of performing an evaluation and management service by telephone and the availability of in person appointments. I also discussed with the patient that there may be a patient responsible charge related to this service. The patient expressed understanding and verbally consented to this telephonic visit.    Interactive audio and video telecommunications were attempted between this provider and patient, however failed, due to patient having technical difficulties OR patient did not have access to video capability.  We continued and completed visit with audio only.     Vital signs may be patient reported or missing.  Subjective:   Kristin Ward is a 66 y.o. female who presents for Medicare Annual (Subsequent) preventive examination.  Review of Systems     Cardiac Risk Factors include: advanced age (>108mn, >>81women);diabetes mellitus;dyslipidemia;hypertension;obesity (BMI >30kg/m2)     Objective:    Today's Vitals   08/09/22 1141  Weight: 233 lb (105.7 kg)  Height: 5' 7.25" (1.708 m)   Body mass index is 36.22 kg/m.     08/09/2022   11:47 AM 07/31/2022    8:22 AM 06/13/2022    8:46 AM 05/10/2022    9:45 AM 04/19/2022    5:28 PM 03/31/2022    5:45 PM 02/21/2022    9:01 AM  Advanced Directives  Does Patient Have a Medical Advance Directive? Yes _0  No  Type of AParamedicof APas[place]empoLiving will        [place] of [place]? Yes - validated most recent copy scanned in chart (See row information)        Would patient like information on creating a medical advance directive?  No - Patient declined No -  Patient declined No - Patient declined No - Patient declined No - Patient declined No - Patient declined    Current Medications (verified) Outpatient Encounter Medications as of 08/09/2022  Medication Sig   Accu-Chek FastClix Lancets MISC TEST TWICE A DAY. PT USES AN ACCU-CHEK GUIDE ME METER   amLODipine (NORVASC) 5 MG tablet Take 5 mg by mouth daily as needed (high B/P).   Ascorbic Acid (VITAMIN C) 500 MG CAPS Take 500 mg by mouth daily.   aspirin 81 MG chewable tablet Chew 81 mg by mouth daily.   atorvastatin (LIPITOR) 40 MG tablet Take 1 tablet (40 mg total) by mouth daily.   brimonidine (ALPHAGAN) 0.2 % ophthalmic solution Place 1 drop into both eyes 2 (two) times daily.   carvedilol (COREG) 12.5 MG tablet TAKE 1 AND 1/2 TABLETS BY MOUTH TWICE DAILY qm   Cholecalciferol (VITAMIN D) 50 MCG (2000 UT) CAPS Take 2,000 Units by mouth daily.   empagliflozin (JARDIANCE) 10 MG TABS tablet Take 1 tablet (10 mg total) by mouth daily before breakfast.   furosemide (LASIX) 20 MG tablet TAKE 1-2 TABLETS (40 MG TOTAL) BY MOUTH AS NEEDED FOR WEIGHT GAIN 3 TO 4 LBS or significant leg edema   gabapentin (NEURONTIN) 300 MG capsule Take 1 capsule (300 mg total) by mouth 2 (two) times daily.   HUMALOG KWIKPEN 100 UNIT/ML KwikPen Inject 3-10 Units into the skin 3 (three) times daily before meals. Per sliding scale   HYDROcodone-acetaminophen (NORCO/VICODIN)  5-325 MG tablet Take 1 tablet by mouth every 6 (six) hours as needed for moderate pain.   ibuprofen (ADVIL) 800 MG tablet Take 1 tablet (800 mg total) by mouth every 8 (eight) hours as needed.   Lancets Misc. (ACCU-CHEK SOFTCLIX LANCET DEV) KIT 1 Units by Does not apply route 2 (two) times daily. Check FSBS BID - Include strips # 50 with 5 refills, Lancets #50 with 5 refills DX: Type 2 DM - ICD 10: E11.9   lidocaine-prilocaine (EMLA) cream Apply 1 application topically as needed.   linaclotide (LINZESS) 72 MCG capsule Take 72 mcg by mouth daily as needed  (constipation).   Multiple Vitamins-Minerals (WOMENS MULTI PO) Take 15 mLs by mouth daily.   ondansetron (ZOFRAN) 8 MG tablet Take 1 tablet (8 mg total) by mouth every 8 (eight) hours as needed for nausea or vomiting (begin on day 3 after chemo).   oxyCODONE (OXY IR/ROXICODONE) 5 MG immediate release tablet Take 1 tablet (5 mg total) by mouth every 8 (eight) hours as needed for severe pain.   pantoprazole (PROTONIX) 40 MG tablet TAKE 1 TABLET BY MOUTH EVERY DAY   prochlorperazine (COMPAZINE) 10 MG tablet Take 1 tablet (10 mg total) by mouth every 6 (six) hours as needed (Nausea or vomiting).   promethazine (PHENERGAN) 25 MG tablet Take 1 tablet (25 mg total) by mouth every 6 (six) hours as needed for nausea or vomiting.   sacubitril-valsartan (ENTRESTO) 97-103 MG Take 1 tablet by mouth 2 (two) times daily.   spironolactone (ALDACTONE) 25 MG tablet Take 1 tablet (25 mg total) by mouth daily.   vitamin B-12 (CYANOCOBALAMIN) 1000 MCG tablet Take 1,000 mcg by mouth daily.   zinc gluconate 50 MG tablet Take 50 mg by mouth daily.   magic mouthwash (nystatin, lidocaine, diphenhydrAMINE, alum & mag hydroxide) suspension Swish and swallow 5 mLs by mouth 4 (four) times daily as needed for mouth pain (Patient not taking: Reported on 08/09/2022)   Facility-Administered Encounter Medications as of 08/09/2022  Medication   insulin glulisine (APIDRA) injection 6 Units    Allergies (verified) Morphine and related, Latex, Tylenol with codeine #3 [acetaminophen-codeine], and Penicillins   History: Past Medical History:  Diagnosis Date   Abnormal x-ray of lungs with single pulmonary nodule 03/15/2016   per records from [place]    Anemia    Asthma    Cholelithiasis 05/19/2017   On CT   Coronary artery calcification seen on CAT scan 05/19/2017   Diabetes mellitus without complication ([place])    type 2   Diabetes mellitus, new onset ([place]) 01/20/2019   Dilated idiopathic cardiomyopathy ([place]) 12/2015   EF  15-20%. Diagnosed in [facility]   Headache    NONE RECENT   Hypertension    Lymph nodes enlarged 07/25/2020   Malignant neoplasm of lower-outer quadrant of left breast of female, estrogen receptor negative ([place]) 06/11/2017   left breast   Mixed hyperlipidemia 06/17/2018   partially blind    Personal history of chemotherapy 2018-2019   Personal history of radiation therapy 2019   Retinitis pigmentosa    Retroperitoneal lymphadenopathy 07/25/2020   Uveitis    Past Surgical History:  Procedure Laterality Date   brain cyst removed  2007   to help with headaches per patient , aspirated    BREAST BIOPSY Left 06/06/2017   x2   BREAST CYST ASPIRATION Left 06/06/2017   BREAST LUMPECTOMY Left 06/26/2017   BREAST LUMPECTOMY WITH RADIOACTIVE SEED AND SENTINEL LYMPH NODE BIOPSY Left 06/26/2017  Procedure: BREAST LUMPECTOMY WITH RADIOACTIVE SEED AND SENTINEL LYMPH NODE BIOPSY ERAS  PATHWAY;  Surgeon: [name], MD;  Location: [place];  Service: General;  Laterality: Left;  pec block   FINE NEEDLE ASPIRATION Left 02/23/2019   Procedure: FINE NEEDLE ASPIRATION;  Surgeon: [name], MD;  Location: [place];  Service: General;  Laterality: Left;  Asiration of left breast seroma    IR IMAGING GUIDED PORT INSERTION  10/13/2020   NASAL TURBINATE REDUCTION     PORT-A-CATH REMOVAL Right 02/23/2019   Procedure: REMOVAL PORT-A-CATH;  Surgeon: [name], MD;  Location: [place];  Service: General;  Laterality: Right;   PORTACATH PLACEMENT N/A 06/26/2017   Procedure: INSERTION PORT-A-CATH;  Surgeon: [name], MD;  Location: [place];  Service: General;  Laterality: N/A;   RIGHT/LEFT HEART CATH AND CORONARY ANGIOGRAPHY N/A 07/31/2022   Procedure: RIGHT/LEFT HEART CATH AND CORONARY ANGIOGRAPHY;  Surgeon: Jolaine Artist, MD;  Location: [facility];  Service: Cardiovascular;  Laterality: N/A;   TRANSTHORACIC ECHOCARDIOGRAM  12/2015    A) [facility] May 2017: Mild concentric LVH. Global hypokinesis. GR daily. EF 18% severe LA dilation. Mitral annular dilatation with papillary muscle dysfunction and moderate MR. Dilated IVC consistent with elevated RAP.   TRANSTHORACIC ECHOCARDIOGRAM  05/2017; 08/2017   a) Mild concentric LVH.  EF 45 to 50% with diffuse hypokinesis.  GR 1 DD.  Mild aortic root dilation.;; b)  Mild LVH EF 45 to 50%.  Diffuse HK.  No significant valve disease.  No significant change.   TRANSTHORACIC ECHOCARDIOGRAM  01/30/2022   Pre-[place]:EF 35 to 40%.  Moderate dysfunction with global HK.  Abnormal strain.  Normal RV size and function.  Normal RVSP.  Aortic root estimated 42 mm.  Mild MR.  Mild/trivial AI.  Normal RVP.;  04/01/2022: Preliminary read 40%.  Formal read 45 to 50% (probably 40 to 45%).  Global HK.  GR 1 DD.  Normal RV size and RVP.  Mild MR.  Now measured at 38 mm.  Normal RAP.   Family History  Problem Relation Age of Onset   Colon cancer Mother 51       deceased 94   Heart attack Father    Breast cancer Other 56       2nd cousin on maternal side; currently 46   Pancreatic cancer Other        2nd cousin once removed on maternal side; deceased 29s   Fibromyalgia Sister    Esophageal cancer Neg Hx    Rectal cancer Neg Hx    Stomach cancer Neg Hx    Social History   Socioeconomic History   Marital status: Widowed    Spouse name: Not on file   Number of children: 2   Years of education: Not on file   Highest education level: Not on file  Occupational History   Not on file  Tobacco Use   Smoking status: Former    Packs/day: 0.25    Years: 23.00    Total pack years: 5.75    Types: Cigarettes    Quit date: 10/09/2015    Years since quitting: 6.8   Smokeless tobacco: Never  Vaping Use   Vaping Use: Never used  Substance and Sexual Activity   Alcohol use: No   Drug use: No   Sexual activity: Never  Other Topics Concern   Not on file  Social History Narrative   She is a  widowed mother of 2 (  daughter and son).   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   She moved to [place] apparently back in 2016, but was visiting family in [place] and was diagnosed with Cardiomyopathy-EF 18%.   She is usually accompanied by her daughter.    At baseline, she walks roughly 1-2 miles a day.   Social Determinants of Health   Financial Resource Strain: Low Risk  (08/09/2022)   Overall Financial Resource Strain (CARDIA)    Difficulty of Paying Living Expenses: Not hard at all  Food Insecurity: No Food Insecurity (08/09/2022)   Hunger Vital Sign    Worried About Running Out of Food in the Last Year: Never true    Ran Out of Food in the Last Year: Never true  Transportation Needs: No Transportation Needs (08/09/2022)   PRAPARE - Hydrologist (Medical): No    Lack of Transportation (Non-Medical): No  Physical Activity: Sufficiently Active (08/09/2022)   Exercise Vital Sign    Days of Exercise per Week: 5 days    Minutes of Exercise per Session: 60 min  Stress: No Stress Concern Present (08/09/2022)   [place]    Feeling of Stress : Not at all  Social Connections: Unknown (09/01/2020)   Social Connection and Isolation Panel [NHANES]    Frequency of Communication with Friends and Family: More than three times a week    Frequency of Social Gatherings with Friends and Family: More than three times a week    Attends Religious Services: More than 4 times per year    Active Member of [employer] or Organizations: No    Attends Music therapist: Never    Marital Status: Not on file    Tobacco Counseling Counseling given: Not Answered   Clinical Intake:  Pre-visit preparation completed: Yes  Pain : No/denies pain     Nutritional Status: BMI > 30  Obese Nutritional Risks: None Diabetes: Yes  How often do you need to have someone help you when you read  instructions, pamphlets, or other written materials from your doctor or pharmacy?: 1 - Never  Diabetic? Yes Nutrition Risk Assessment:  Has the patient had any N/V/D within the last 2 months?  No  Does the patient have any non-healing wounds?  No  Has the patient had any unintentional weight loss or weight gain?  No   Diabetes:  Is the patient diabetic?  Yes  If diabetic, was a CBG obtained today?  No  Did the patient bring in their glucometer from home?  No  How often do you monitor your CBG's? Does not.   Financial Strains and Diabetes Management:  Are you having any financial strains with the device, your supplies or your medication? No .  Does the patient want to be seen by Chronic Care Management for management of their diabetes?  No  Would the patient like to be referred to a Nutritionist or for Diabetic Management?  No   Diabetic Exams:  Diabetic Eye Exam: Completed 09/19/2021 Diabetic Foot Exam: Completed 02/04/2022   Interpreter Needed?: No  Information entered by :: NAllen LPN   Activities of Daily Living    08/09/2022   11:49 AM  In your present state of health, do you have any difficulty performing the following activities:  Hearing? 0  Vision? 0  Difficulty concentrating or making decisions? 0  Walking or climbing stairs? 0  Dressing or bathing? 0  Doing  errands, shopping? 0  Preparing Food and eating ? N  Using the Toilet? N  In the past six months, have you accidently leaked urine? Y  Do you have problems with loss of bowel control? N  Managing your Medications? N  Managing your Finances? N  Housekeeping or managing your Housekeeping? N    Patient Care Team: [name], MD as PCP - General (Family Medicine) [name], MD as PCP - Cardiology (Cardiology) [name], [name], MD as PCP - Advanced Heart Failure (Cardiology) [name], MD as Consulting Physician (Hematology) [name], MD as Consulting Physician (General  Surgery) [name], [facility] as Pharmacist (Pharmacist) Jacelyn Pi, MD as Referring Physician (Endocrinology)  Indicate any recent Medical Services you may have received from other than [facility] providers in the past year (date may be approximate).     Assessment:   This is a routine wellness examination for Kristin Ward.  Hearing/Vision screen Vision Screening - Comments:: Regular eye exams, [facility]  Dietary issues and exercise activities discussed: Current Exercise Habits: Home exercise routine, Type of exercise: stretching;calisthenics, Time (Minutes): 60, Frequency (Times/Week): 5, Weekly Exercise (Minutes/Week): 300   Goals Addressed             This Visit's Progress    Patient Stated       08/09/2022, wants to get rid of cancer       Depression Screen    08/09/2022   11:48 AM 04/29/2022    8:24 AM 01/12/2021    9:26 AM 04/05/2020    9:08 AM 10/07/2019    9:45 AM 04/09/2019    6:54 PM 12/16/2018    1:44 PM  PHQ 2/9 Scores  PHQ - 2 Score 0 0 0 0 0 0 0  PHQ- 9 Score 0          Fall Risk    08/09/2022   11:47 AM 01/12/2021    9:26 AM 04/05/2020    9:08 AM 10/07/2019    9:45 AM 04/09/2019    6:54 PM  Fall Risk   Falls in the past year? 0 0 1 0 0  Number falls in past yr: 0 0 1 0   Injury with Fall? 0 0 1 0   Risk for fall due to : Impaired balance/gait;Medication side effect;Impaired mobility No Fall Risks     Follow up Falls prevention discussed;Falls evaluation completed;Education provided Falls evaluation completed       FALL RISK PREVENTION PERTAINING TO THE HOME:  Any stairs in or around the home? Yes  If so, are there any without handrails? No  Home free of loose throw rugs in walkways, pet beds, electrical cords, etc? Yes  Adequate lighting in your home to reduce risk of falls? Yes   ASSISTIVE DEVICES UTILIZED TO PREVENT FALLS:  Life alert? No  Use of a cane, walker or w/c? Yes  Grab bars in the bathroom? Yes  Shower chair or bench in shower?  Yes  Elevated toilet seat or a handicapped toilet? No   TIMED UP AND GO:  Was the test performed? No .      Cognitive Function:        08/09/2022   11:50 AM  6CIT Screen  What Year? 0 points  What month? 0 points  What time? 0 points  Count back from 20 0 points  Months in reverse 0 points  Repeat phrase 2 points  Total Score 2 points    Immunizations  There  is no immunization history on file for this patient.  TDAP status: Due, Education has been provided regarding the importance of this vaccine. Advised may receive this vaccine at local pharmacy or Health Dept. Aware to provide a copy of the vaccination record if obtained from local pharmacy or Health Dept. Verbalized acceptance and understanding.  Flu Vaccine status: Declined, Education has been provided regarding the importance of this vaccine but patient still declined. Advised may receive this vaccine at local pharmacy or Health Dept. Aware to provide a copy of the vaccination record if obtained from local pharmacy or Health Dept. Verbalized acceptance and understanding.  Pneumococcal vaccine status: Declined,  Education has been provided regarding the importance of this vaccine but patient still declined. Advised may receive this vaccine at local pharmacy or Health Dept. Aware to provide a copy of the vaccination record if obtained from local pharmacy or Health Dept. Verbalized acceptance and understanding.   Covid-19 vaccine status: Declined, Education has been provided regarding the importance of this vaccine but patient still declined. Advised may receive this vaccine at local pharmacy or Health Dept.or vaccine clinic. Aware to provide a copy of the vaccination record if obtained from local pharmacy or Health Dept. Verbalized acceptance and understanding.  Qualifies for Shingles Vaccine? Yes   Zostavax completed No   Shingrix Completed?: No.    Education has been provided regarding the importance of this vaccine.  Patient has been advised to call insurance company to determine out of pocket expense if they have not yet received this vaccine. Advised may also receive vaccine at local pharmacy or Health Dept. Verbalized acceptance and understanding.  Screening Tests Health Maintenance  Topic Date Due   DTaP/Tdap/Td (1 - Tdap) Never done   Zoster Vaccines- Shingrix (1 of 2) Never done   Diabetic kidney evaluation - Urine ACR  12/17/2019   Medicare Annual Wellness (AWV)  01/12/2022   HEMOGLOBIN A1C  04/14/2022   MAMMOGRAM  09/13/2022 (Originally 06/02/2021)   OPHTHALMOLOGY EXAM  09/19/2022   FOOT EXAM  02/05/2023   COLONOSCOPY (Pts 45-59yr Insurance coverage will need to be confirmed)  06/10/2023   Diabetic kidney evaluation - eGFR measurement  07/06/2023   PAP SMEAR-Modifier  04/07/2025   Hepatitis C Screening  Completed   HIV Screening  Completed   HPV VACCINES  Aged Out   INFLUENZA VACCINE  Discontinued   COVID-19 Vaccine  Discontinued    Health Maintenance  Health Maintenance Due  Topic Date Due   DTaP/Tdap/Td (1 - Tdap) Never done   Zoster Vaccines- Shingrix (1 of 2) Never done   Diabetic kidney evaluation - Urine ACR  12/17/2019   Medicare Annual Wellness (AWV)  01/12/2022   HEMOGLOBIN A1C  04/14/2022    Colorectal cancer screening: Type of screening: Colonoscopy. Completed 06/09/2018. Repeat every 5 years  Mammogram status: due  Bone Density status: n/a  Lung Cancer Screening: (Low Dose CT Chest recommended if Age 64-80years, 30 pack-year currently smoking OR have quit w/in 15years.) does not qualify.   Lung Cancer Screening Referral: no  Additional Screening:  Hepatitis C Screening: does qualify; Completed 03/20/2017  Vision Screening: Recommended annual ophthalmology exams for early detection of glaucoma and other disorders of the eye. Is the patient up to date with their annual eye exam?  Yes  Who is the provider or what is the name of the office in which the patient  attends annual eye exams? [facility] If pt is not established with a provider, would they  like to be referred to a provider to establish care? No .   Dental Screening: Recommended annual dental exams for proper oral hygiene  Community Resource Referral / Chronic Care Management: CRR required this visit?  No   CCM required this visit?  No      Plan:     I have personally reviewed and noted the following in the patient's chart:   Medical and social history Use of alcohol, tobacco or illicit drugs  Current medications and supplements including opioid prescriptions. Patient is not currently taking opioid prescriptions. Functional ability and status Nutritional status Physical activity Advanced directives List of other physicians Hospitalizations, surgeries, and ER visits in previous 12 months Vitals Screenings to include cognitive, depression, and falls Referrals and appointments  In addition, I have reviewed and discussed with patient certain preventive protocols, quality metrics, and best practice recommendations. A written personalized care plan for preventive services as well as general preventive health recommendations were provided to patient.     Kellie Simmering, LPN   [id number]   Nurse Notes: none  Due to this being a virtual visit, the after visit summary with patients personalized plan was offered to patient via mail or my-chart.  Patient would like to access on my-chart

## 2022-08-13 ENCOUNTER — Ambulatory Visit (HOSPITAL_COMMUNITY)
Admission: RE | Admit: 2022-08-13 | Discharge: 2022-08-13 | Disposition: A | Discharge status: Home / Self Care | Payer: Medicare Other | Source: Ambulatory Visit | Attending: Hematology | Admitting: Hematology

## 2022-08-13 ENCOUNTER — Other Ambulatory Visit: Payer: Self-pay

## 2022-08-13 DIAGNOSIS — C7951 Secondary malignant neoplasm of bone: Secondary | ICD-10-CM | POA: Diagnosis not present

## 2022-08-13 DIAGNOSIS — K8020 Calculus of gallbladder without cholecystitis without obstruction: Secondary | ICD-10-CM | POA: Diagnosis not present

## 2022-08-13 DIAGNOSIS — K449 Diaphragmatic hernia without obstruction or gangrene: Secondary | ICD-10-CM | POA: Diagnosis not present

## 2022-08-13 DIAGNOSIS — J984 Other disorders of lung: Secondary | ICD-10-CM | POA: Diagnosis not present

## 2022-08-13 DIAGNOSIS — J9859 Other diseases of mediastinum, not elsewhere classified: Secondary | ICD-10-CM | POA: Diagnosis not present

## 2022-08-13 DIAGNOSIS — C50512 Malignant neoplasm of lower-outer quadrant of left female breast: Secondary | ICD-10-CM | POA: Diagnosis not present

## 2022-08-13 DIAGNOSIS — C50919 Malignant neoplasm of unspecified site of unspecified female breast: Secondary | ICD-10-CM

## 2022-08-13 DIAGNOSIS — Z171 Estrogen receptor negative status [ER-]: Secondary | ICD-10-CM | POA: Insufficient documentation

## 2022-08-13 DIAGNOSIS — Q433 Congenital malformations of intestinal fixation: Secondary | ICD-10-CM | POA: Diagnosis not present

## 2022-08-13 MED ORDER — HEPARIN SOD (PORK) LOCK FLUSH 100 UNIT/ML IV SOLN
INTRAVENOUS | Status: AC
  Administered 2022-08-13: 500 [IU]
  Filled 2022-08-13: qty 5

## 2022-08-13 MED ORDER — IOHEXOL 300 MG/ML  SOLN
100.0000 mL | Freq: Once | INTRAMUSCULAR | Status: AC | PRN
  Administered 2022-08-13: 100 mL via INTRAVENOUS

## 2022-08-13 MED ORDER — SODIUM CHLORIDE (PF) 0.9 % IJ SOLN
INTRAMUSCULAR | Status: AC
  Filled 2022-08-13: qty 50

## 2022-08-14 ENCOUNTER — Inpatient Hospital Stay: Payer: 59 | Attending: Hematology

## 2022-08-14 ENCOUNTER — Inpatient Hospital Stay (HOSPITAL_BASED_OUTPATIENT_CLINIC_OR_DEPARTMENT_OTHER): Payer: 59 | Admitting: Nurse Practitioner

## 2022-08-14 ENCOUNTER — Inpatient Hospital Stay: Payer: 59

## 2022-08-14 ENCOUNTER — Encounter: Payer: Self-pay | Admitting: Nurse Practitioner

## 2022-08-14 ENCOUNTER — Inpatient Hospital Stay: Payer: 59 | Admitting: Nurse Practitioner

## 2022-08-14 VITALS — BP 128/74 | HR 76 | Temp 98.7°F | Resp 16 | Wt 220.6 lb

## 2022-08-14 DIAGNOSIS — C50512 Malignant neoplasm of lower-outer quadrant of left female breast: Secondary | ICD-10-CM | POA: Diagnosis not present

## 2022-08-14 DIAGNOSIS — I10 Essential (primary) hypertension: Secondary | ICD-10-CM | POA: Insufficient documentation

## 2022-08-14 DIAGNOSIS — R600 Localized edema: Secondary | ICD-10-CM

## 2022-08-14 DIAGNOSIS — Z87891 Personal history of nicotine dependence: Secondary | ICD-10-CM | POA: Insufficient documentation

## 2022-08-14 DIAGNOSIS — H3552 Pigmentary retinal dystrophy: Secondary | ICD-10-CM | POA: Insufficient documentation

## 2022-08-14 DIAGNOSIS — C7989 Secondary malignant neoplasm of other specified sites: Secondary | ICD-10-CM | POA: Insufficient documentation

## 2022-08-14 DIAGNOSIS — Z95828 Presence of other vascular implants and grafts: Secondary | ICD-10-CM

## 2022-08-14 DIAGNOSIS — Z171 Estrogen receptor negative status [ER-]: Secondary | ICD-10-CM | POA: Insufficient documentation

## 2022-08-14 DIAGNOSIS — E1165 Type 2 diabetes mellitus with hyperglycemia: Secondary | ICD-10-CM | POA: Diagnosis not present

## 2022-08-14 DIAGNOSIS — M792 Neuralgia and neuritis, unspecified: Secondary | ICD-10-CM

## 2022-08-14 DIAGNOSIS — Z7189 Other specified counseling: Secondary | ICD-10-CM

## 2022-08-14 DIAGNOSIS — Z5111 Encounter for antineoplastic chemotherapy: Secondary | ICD-10-CM | POA: Diagnosis not present

## 2022-08-14 DIAGNOSIS — T451X5A Adverse effect of antineoplastic and immunosuppressive drugs, initial encounter: Secondary | ICD-10-CM

## 2022-08-14 DIAGNOSIS — G620 Drug-induced polyneuropathy: Secondary | ICD-10-CM

## 2022-08-14 DIAGNOSIS — G893 Neoplasm related pain (acute) (chronic): Secondary | ICD-10-CM

## 2022-08-14 DIAGNOSIS — G629 Polyneuropathy, unspecified: Secondary | ICD-10-CM | POA: Insufficient documentation

## 2022-08-14 DIAGNOSIS — R530 Neoplastic (malignant) related fatigue: Secondary | ICD-10-CM

## 2022-08-14 DIAGNOSIS — Z79899 Other long term (current) drug therapy: Secondary | ICD-10-CM | POA: Diagnosis not present

## 2022-08-14 DIAGNOSIS — J449 Chronic obstructive pulmonary disease, unspecified: Secondary | ICD-10-CM | POA: Diagnosis not present

## 2022-08-14 DIAGNOSIS — C7951 Secondary malignant neoplasm of bone: Secondary | ICD-10-CM

## 2022-08-14 DIAGNOSIS — Z5112 Encounter for antineoplastic immunotherapy: Secondary | ICD-10-CM | POA: Diagnosis not present

## 2022-08-14 DIAGNOSIS — I5042 Chronic combined systolic (congestive) and diastolic (congestive) heart failure: Secondary | ICD-10-CM | POA: Diagnosis not present

## 2022-08-14 DIAGNOSIS — C50919 Malignant neoplasm of unspecified site of unspecified female breast: Secondary | ICD-10-CM

## 2022-08-14 LAB — CBC WITH DIFFERENTIAL (CANCER CENTER ONLY)
Abs Immature Granulocytes: 0.02 10*3/uL (ref 0.00–0.07)
Basophils Absolute: 0 10*3/uL (ref 0.0–0.1)
Basophils Relative: 0 %
Eosinophils Absolute: 0.2 10*3/uL (ref 0.0–0.5)
Eosinophils Relative: 6 %
HCT: 32.2 % — ABNORMAL LOW (ref 36.0–46.0)
Hemoglobin: 10.7 g/dL — ABNORMAL LOW (ref 12.0–15.0)
Immature Granulocytes: 1 %
Lymphocytes Relative: 12 %
Lymphs Abs: 0.5 10*3/uL — ABNORMAL LOW (ref 0.7–4.0)
MCH: 30.7 pg (ref 26.0–34.0)
MCHC: 33.2 g/dL (ref 30.0–36.0)
MCV: 92.5 fL (ref 80.0–100.0)
Monocytes Absolute: 0.3 10*3/uL (ref 0.1–1.0)
Monocytes Relative: 8 %
Neutro Abs: 2.9 10*3/uL (ref 1.7–7.7)
Neutrophils Relative %: 73 %
Platelet Count: 269 10*3/uL (ref 150–400)
RBC: 3.48 MIL/uL — ABNORMAL LOW (ref 3.87–5.11)
RDW: 16.7 % — ABNORMAL HIGH (ref 11.5–15.5)
WBC Count: 4 10*3/uL (ref 4.0–10.5)
nRBC: 0 % (ref 0.0–0.2)

## 2022-08-14 LAB — TSH: TSH: 5.235 u[IU]/mL — ABNORMAL HIGH (ref 0.350–4.500)

## 2022-08-14 LAB — CMP (CANCER CENTER ONLY)
ALT: 12 U/L (ref 0–44)
AST: 22 U/L (ref 15–41)
Albumin: 3.8 g/dL (ref 3.5–5.0)
Alkaline Phosphatase: 77 U/L (ref 38–126)
Anion gap: 5 (ref 5–15)
BUN: 15 mg/dL (ref 8–23)
CO2: 27 mmol/L (ref 22–32)
Calcium: 9.6 mg/dL (ref 8.9–10.3)
Chloride: 108 mmol/L (ref 98–111)
Creatinine: 0.69 mg/dL (ref 0.44–1.00)
GFR, Estimated: >60 mL/min (ref >60)
Glucose, Bld: 99 mg/dL (ref 70–99)
Potassium: 3.9 mmol/L (ref 3.5–5.1)
Sodium: 140 mmol/L (ref 135–145)
Total Bilirubin: 0.7 mg/dL (ref 0.3–1.2)
Total Protein: 6.9 g/dL (ref 6.5–8.1)

## 2022-08-14 LAB — MAGNESIUM: Magnesium: 1.9 mg/dL (ref 1.7–2.4)

## 2022-08-14 MED ORDER — HEPARIN SOD (PORK) LOCK FLUSH 100 UNIT/ML IV SOLN
500.0000 [IU] | Freq: Once | INTRAVENOUS | Status: AC | PRN
  Administered 2022-08-14: 500 [IU] via INTRAVENOUS

## 2022-08-14 MED ORDER — SODIUM CHLORIDE 0.9% FLUSH
10.0000 mL | INTRAVENOUS | Status: DC | PRN
  Administered 2022-08-14: 10 mL via INTRAVENOUS

## 2022-08-14 NOTE — Progress Notes (Signed)
This RN de-accessed pt port, clean, dry, intact, flushes well and heparin locked. Gauze bandage placed. At the time of d/c pt requested to go to cafeteria, pt wished to wheel herself over to the cafeteria, as she was in a WC. Pt agreed to have RN wheel her over to [facility] elevator, pt adamant that she could wheel herself the rest of the way. Pt d/c'ed in stable condition without complaints. Pt aware to call with any questions or concerns.

## 2022-08-14 NOTE — Patient Instructions (Signed)

## 2022-08-14 NOTE — Progress Notes (Signed)
[facility]  [phone] [phone]   Name: Sabella Traore Date: 08/14/2022 MRN: [id number]  DOB: 08-31-1955  Patient Care Team: [name], MD as PCP - General (Family Medicine) [name], MD as PCP - Cardiology (Cardiology) [name], [name], MD as PCP - Advanced Heart Failure (Cardiology) [name], MD as Consulting Physician (Hematology) [name], MD as Consulting Physician (General Surgery) [name], [facility] as Pharmacist (Pharmacist) Jacelyn Pi, MD as Referring Physician (Endocrinology)    REASON FOR CONSULTATION: Adora Yeh is a 67 y.o. 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 Patient is s/p left breast lumpectomy, adjuvant chemo and Radiation. Biopsy of right gluteus on 09/02/20 due to new pain showed metastatic carcinoma, consistent with breast primary. Palliative ask to see for symptom and pain management and goals of care.    SOCIAL HISTORY:     reports that she quit smoking about 6 years ago. Her smoking use included cigarettes. She has a 5.75 pack-year smoking history. She has never used smokeless tobacco. She reports that she does not drink alcohol and does not use drugs.  ADVANCE DIRECTIVES:  MOST and DNR filed  CODE STATUS: DNR  PAST MEDICAL HISTORY: Past Medical History:  Diagnosis Date   Abnormal x-ray of lungs with single pulmonary nodule 03/15/2016   per records from [place]    Anemia    Asthma    Cholelithiasis 05/19/2017   On CT   Coronary artery calcification seen on CAT scan 05/19/2017   Diabetes mellitus without complication ([place])    type 2   Diabetes mellitus, new onset ([place]) 01/20/2019   Dilated idiopathic cardiomyopathy ([place]) 12/2015   EF 15-20%. Diagnosed in [facility]   Headache    NONE RECENT    Hypertension    Lymph nodes enlarged 07/25/2020   Malignant neoplasm of lower-outer quadrant of left breast of female, estrogen receptor negative ([place]) 06/11/2017   left breast   Mixed hyperlipidemia 06/17/2018   partially blind    Personal history of chemotherapy 2018-2019   Personal history of radiation therapy 2019   Retinitis pigmentosa    Retroperitoneal lymphadenopathy 07/25/2020   Uveitis     PAST SURGICAL HISTORY:  Past Surgical History:  Procedure Laterality Date   brain cyst removed  2007   to help with headaches per patient , aspirated    BREAST BIOPSY Left 06/06/2017   x2   BREAST CYST ASPIRATION Left 06/06/2017   BREAST LUMPECTOMY Left 06/26/2017   BREAST LUMPECTOMY WITH RADIOACTIVE SEED AND SENTINEL LYMPH NODE BIOPSY Left 06/26/2017   Procedure: BREAST LUMPECTOMY WITH RADIOACTIVE SEED AND SENTINEL LYMPH NODE BIOPSY ERAS  PATHWAY;  Surgeon: [name], MD;  Location: [place];  Service: General;  Laterality: Left;  pec block   FINE NEEDLE ASPIRATION Left 02/23/2019   Procedure: FINE NEEDLE ASPIRATION;  Surgeon: [name], MD;  Location: [place];  Service: General;  Laterality: Left;  Asiration of left breast seroma    IR IMAGING GUIDED PORT INSERTION  10/13/2020   NASAL TURBINATE REDUCTION     PORT-A-CATH REMOVAL Right 02/23/2019   Procedure: REMOVAL PORT-A-CATH;  Surgeon: [name], MD;  Location: [place];  Service: General;  Laterality: Right;   PORTACATH PLACEMENT N/A 06/26/2017   Procedure: INSERTION PORT-A-CATH;  Surgeon: [name], MD;  Location: [place];  Service: General;  Laterality: N/A;   RIGHT/LEFT HEART CATH AND CORONARY ANGIOGRAPHY N/A 07/31/2022   Procedure: RIGHT/LEFT HEART CATH AND CORONARY ANGIOGRAPHY;  Surgeon: Jolaine Artist, MD;  Location: [place] [facility];  Service: Cardiovascular;  Laterality: N/A;   TRANSTHORACIC ECHOCARDIOGRAM  12/2015   A) [facility] May 2017: Mild concentric LVH.  Global hypokinesis. GR daily. EF 18% severe LA dilation. Mitral annular dilatation with papillary muscle dysfunction and moderate MR. Dilated IVC consistent with elevated RAP.   TRANSTHORACIC ECHOCARDIOGRAM  05/2017; 08/2017   a) Mild concentric LVH.  EF 45 to 50% with diffuse hypokinesis.  GR 1 DD.  Mild aortic root dilation.;; b)  Mild LVH EF 45 to 50%.  Diffuse HK.  No significant valve disease.  No significant change.   TRANSTHORACIC ECHOCARDIOGRAM  01/30/2022   Pre-Enhertu:EF 35 to 40%.  Moderate dysfunction with global HK.  Abnormal strain.  Normal RV size and function.  Normal RVSP.  Aortic root estimated 42 mm.  Mild MR.  Mild/trivial AI.  Normal RVP.;  04/01/2022: Preliminary read 40%.  Formal read 45 to 50% (probably 40 to 45%).  Global HK.  GR 1 DD.  Normal RV size and RVP.  Mild MR.  Now measured at 38 mm.  Normal RAP.    HEMATOLOGY/ONCOLOGY HISTORY:  Oncology History Overview Note  Cancer Staging Malignant neoplasm of lower-outer quadrant of left breast of female, estrogen receptor negative ([place]) Staging form: Breast, AJCC 8th Edition - Clinical stage from 06/06/2017: Stage IB (cT1c, cN0, cM0, G3, ER-, PR-, HER2-) - Signed by [name], MD on 06/15/2017 Nuclear grade: G3 Histologic grading system: 3 grade system - Pathologic stage from 06/26/2017: Stage IB (pT1c, pN0, cM0, G3, ER-, PR-, HER2-) - Signed by [name], MD on 07/10/2017 Neoadjuvant therapy: No Nuclear grade: G3 Multigene prognostic tests performed: None Histologic grading system: 3 grade system Laterality: Left     Malignant neoplasm of lower-outer quadrant of left breast of female, estrogen receptor negative ([place])  05/30/2017 Mammogram   Diagnostic mammo and US IMPRESSION: 1. Highly suspicious 1.4 cm mass in the slightly lower slightly outer left breast -tissue sampling recommended. 2. Indeterminate 0.5 mm mass in the slightly lower slightly outer left breast -tissue sampling recommended. 3. At least 2 left  axillary lymph nodes with borderline cortical thickness.   06/06/2017 Initial Biopsy   Diagnosis 1. Breast, left, needle core biopsy, 5:30 o'clock - INVASIVE DUCTAL CARCINOMA, G3 2. Lymph node, needle/core biopsy, left axillary - NO CARCINOMA IDENTIFIED IN ONE LYMPH NODE (0/1)   06/06/2017 Initial Diagnosis   Malignant neoplasm of lower-outer quadrant of left breast of female, estrogen receptor negative ([place])   06/06/2017 Receptors her2   Estrogen Receptor: 0%, NEGATIVE Progesterone Receptor: 0%, NEGATIVE Proliferation Marker Ki67: 70%   06/26/2017 Surgery   LEFT BREAST LUMPECTOMY WITH RADIOACTIVE SEED AND SENTINEL LYMPH NODE BIOPSY ERAS  PATHWAY AND INSERTION PORT-A-CATH By Dr. Barry Dienes on 06/26/17    06/26/2017 Pathology Results   Diagnosis 06/26/17  1. Breast, lumpectomy, Left - INVASIVE DUCTAL CARCINOMA, GRADE III/III, SPANNING 1.2 CM. - THE SURGICAL RESECTION MARGINS ARE NEGATIVE FOR CARCINOMA. - SEE ONCOLOGY TABLE BELOW. 2. Lymph node, sentinel, biopsy, Left axillary #1 - THERE IS NO EVIDENCE OF CARCINOMA IN 1 OF 1 LYMPH NODE (0/1). 3. Lymph node, sentinel, biopsy, Left axillary #2 - THERE IS NO EVIDENCE OF CARCINOMA IN 1 OF 1 LYMPH NODE (0/1). 4. Lymph node, sentinel, biopsy, Left axillary #3 - THERE IS NO EVIDENCE OF CARCINOMA  IN 1 OF 1 LYMPH NODE (0/1).    07/25/2017 - 09/29/2017 Chemotherapy   Adjuvant cytoxan and docetaxel (TC) every 3 weekd for 4 cycles     11/19/2017 - 12/17/2017 Radiation Therapy   Adjuvant breast radiation Left breast treated to 42.5 Gy with 17 fx of 2.5 Gy followed by a boost of 7.5 Gy with 3 fx of 2.5 Gy      02/2019 Procedure   She had PAC removal in 02/2019.    07/23/2020 Imaging   MRI Lumbar Spine 07/23/20 IMPRESSION: 1. No significant disc herniation, spinal canal or neural foraminal stenosis at any level. 2. Moderate facet degenerative changes at L3-4, L4-5 and L5-S1. 3. Multiple enlarged retroperitoneal and right iliac lymph  nodes. Recommend correlation with CT of the abdomen and pelvis with contrast.   08/15/2020 Imaging   CT AP 08/15/20  IMPRESSION: 1. Right iliac and periaortic adenopathy is noted concerning for metastatic disease or lymphoma. Also noted is abnormal soft tissue mass anterior and posterior to the right iliac wing concerning for malignancy or metastatic disease. MRI is recommended for further evaluation. 2. Irregular lucency with sclerotic margins is seen involving the right iliac crest concerning for possible lytic lesion. MRI may be performed for further evaluation. 3. Enlarged fibroid uterus. 4. Small gallstone. 5. Aortic atherosclerosis.   08/22/2020 Pathology Results   FINAL MICROSCOPIC DIAGNOSIS: 08/22/20  A. NEEDLE CORE, RIGHT GLUTEUS MINIMUMUS, BIOPSY:  -  Metastatic carcinoma  -  See comment   COMMENT:  By immunohistochemistry, the neoplastic cells are positive for  cytokeratin-7 and have weak patchy positivity for GATA3 but are negative for cytokeratin-20 and GCDFP.  The combined morphology and immunophenotype, support metastasis from the patient's known breast primary carcinoma.   ADDENDUM:  PROGNOSTIC INDICATOR RESULTS:  The tumor cells are NEGATIVE for Her2 (0).  Estrogen Receptor:       POSITIVE, 20%, WEAK STAINING  Progesterone Receptor:   NEGATIVE   PD-L1 (0) negative    09/04/2020 - 09/22/2020 Radiation Therapy   Palliative radiation to right iliac 09/04/20 - 09/22/20, Dr. Lisbeth Renshaw   09/26/2020 -  Chemotherapy   first line weekly Taxol 3 weeks on/1 week off starting 09/26/20 --Reduced to 2 weeks on/1 week off starting with C3 due to neuropathy. Stopped after C4 on 12/18/20 due to neuratrophy.  --Switched to Xeloda on 01/01/21 at 1544m in the AM and 20055min the PM for 2 weeks on/1 week off     10/13/2020 Procedure    PAC placement on 10/13/20.    04/10/2021 Imaging   CT CAP  IMPRESSION: 1. Enlarging abdominopelvic retroperitoneal and right inguinal adenopathy,  compatible with disease progression. 2. Lytic metastasis in the right iliac wing, similar. 3. Hepatomegaly. 4. Cholelithiasis. 5. Enlarged fibroid uterus. 6.  Aortic atherosclerosis (ICD10-I70.0).   06/18/2021 Imaging   CT CAP  IMPRESSION: 1. Today's study demonstrates progression of metastatic disease as evidence by increased number and size of numerous enlarged lymph nodes in the retroperitoneum, along the right pelvic sidewall, and in the upper right thigh. 2. Osseous metastases in the bony pelvis appear similar to the prior examination. No definite new osseous lesions are otherwise noted. 3. Fibroid uterus again noted. 4. Cholelithiasis without evidence of acute cholecystitis. 5. Aortic atherosclerosis, in addition to left anterior descending coronary artery disease. Please note that although the presence of coronary artery calcium documents the presence of coronary artery disease, the severity of this disease and any potential stenosis cannot be assessed on this non-gated CT  examination. Assessment for potential risk factor modification, dietary therapy or pharmacologic therapy may be warranted, if clinically indicated. 6. Additional incidental findings, as above.   06/20/2021 Imaging   Bone Scan  IMPRESSION: No osseous uptake suspicious for recurrent/progressive metastatic disease.   07/03/2021 - 01/11/2022 Chemotherapy   Patient is on Treatment Plan : BREAST METASTATIC Sacituzumab govitecan-hziy [name]) q21d     09/27/2021 Imaging   EXAM: CT CHEST, ABDOMEN, AND PELVIS WITH CONTRAST  IMPRESSION: 1. Marked interval improvement in previously demonstrated lymphadenopathy in the abdomen and pelvis. A few prominent right inguinal lymph nodes remain. 2. Stable osseous metastatic disease in the pelvis. 3. No evidence of local recurrence or other metastatic disease. 4. Stable incidental findings including cholelithiasis, uterine fibroids and Aortic Atherosclerosis  (ICD10-I70.0).  ADDENDUM: Not mentioned in the original impression is a possible nonocclusive fibrin sheath along the distal aspect of the Port-A-Cath.   09/27/2021 Imaging   EXAM: NUCLEAR MEDICINE WHOLE BODY BONE SCAN  IMPRESSION: Stable examination, without abnormal osseous uptake.   11/27/2021 Imaging   CT AP IMPRESSION: 1. Stable lytic and sclerotic lesion in the RIGHT iliac wing. 2. No evidence of visceral metastasis or adenopathy in the abdomenpelvis. 3. Leiomyomatous uterus.  ADDENDUM REPORT: 11/29/2021 09:33 Upon further review and discussion with Cira Rue NP, the RIGHT inguinal lymph nodes previous described have increased in volume. For example 14 mm node (image 91/2) is increased from 9 mm. Adjacent 14 mm node on image 86 is increased from 8 mm. Additionally, LEFT common iliac lymph node is also slightly increased measuring 10 mm (46/2) compared to 7 mm on comparison exam from 09/27/2021. These sites demonstrates similar but greater lymphadenopathy on CT 06/18/2021.   02/15/2022 - 03/08/2022 Chemotherapy   Patient is on Treatment Plan : BREAST METASTATIC fam-trastuzumab deruxtecan-nxki (Enhertu) q21d     02/15/2022 - 05/10/2022 Chemotherapy   Patient is on Treatment Plan : BREAST METASTATIC Fam-Trastuzumab Deruxtecan-nxki (Enhertu) (5.4) q21d     06/07/2022 - 07/05/2022 Chemotherapy   Patient is on Treatment Plan : BREAST METASTATIC Eribulin D1,8 q21d     07/26/2022 - 07/26/2022 Chemotherapy   Patient is on Treatment Plan : HEAD/NECK Pembrolizumab (200) q21d     08/14/2022 -  Chemotherapy   Patient is on Treatment Plan : BREAST Carboplatin + Gemcitabine D1,8 q21d     Neuropathy associated with cancer ([place])  11/21/2021 Initial Diagnosis   Neuropathy associated with cancer ([facility])     ALLERGIES:  is allergic to morphine and related, latex, tylenol with codeine #3 [acetaminophen-codeine], and penicillins.  MEDICATIONS:  Current Outpatient Medications  Medication Sig  Dispense Refill   Accu-Chek FastClix Lancets MISC TEST TWICE A DAY. PT USES AN ACCU-CHEK GUIDE ME METER 102 each 2   amLODipine (NORVASC) 5 MG tablet Take 5 mg by mouth daily as needed (high B/P).     Ascorbic Acid (VITAMIN C) 500 MG CAPS Take 500 mg by mouth daily.     aspirin 81 MG chewable tablet Chew 81 mg by mouth daily.     atorvastatin (LIPITOR) 40 MG tablet Take 1 tablet (40 mg total) by mouth daily. 90 tablet 3   brimonidine (ALPHAGAN) 0.2 % ophthalmic solution Place 1 drop into both eyes 2 (two) times daily.     carvedilol (COREG) 12.5 MG tablet TAKE 1 AND 1/2 TABLETS BY MOUTH TWICE DAILY qm 90 tablet 10   Cholecalciferol (VITAMIN D) 50 MCG (2000 UT) CAPS Take 2,000 Units by mouth daily.     empagliflozin (JARDIANCE)  10 MG TABS tablet Take 1 tablet (10 mg total) by mouth daily before breakfast. 90 tablet 3   furosemide (LASIX) 20 MG tablet TAKE 1-2 TABLETS (40 MG TOTAL) BY MOUTH AS NEEDED FOR WEIGHT GAIN 3 TO 4 LBS or significant leg edema 60 tablet 1   gabapentin (NEURONTIN) 300 MG capsule Take 1 capsule (300 mg total) by mouth 2 (two) times daily. 30 capsule 1   HUMALOG KWIKPEN 100 UNIT/ML KwikPen Inject 3-10 Units into the skin 3 (three) times daily before meals. Per sliding scale     HYDROcodone-acetaminophen (NORCO/VICODIN) 5-325 MG tablet Take 1 tablet by mouth every 6 (six) hours as needed for moderate pain. 30 tablet 0   ibuprofen (ADVIL) 800 MG tablet Take 1 tablet (800 mg total) by mouth every 8 (eight) hours as needed. 30 tablet 2   Lancets Misc. (ACCU-CHEK SOFTCLIX LANCET DEV) KIT 1 Units by Does not apply route 2 (two) times daily. Check FSBS BID - Include strips # 50 with 5 refills, Lancets #50 with 5 refills DX: Type 2 DM - ICD 10: E11.9 1 kit 0   lidocaine-prilocaine (EMLA) cream Apply 1 application topically as needed. 30 g 0   linaclotide (LINZESS) 72 MCG capsule Take 72 mcg by mouth daily as needed (constipation).     magic mouthwash (nystatin, lidocaine,  diphenhydrAMINE, alum & mag hydroxide) suspension Swish and swallow 5 mLs by mouth 4 (four) times daily as needed for mouth pain (Patient not taking: Reported on 08/09/2022) 140 mL 0   Multiple Vitamins-Minerals (WOMENS MULTI PO) Take 15 mLs by mouth daily.     ondansetron (ZOFRAN) 8 MG tablet Take 1 tablet (8 mg total) by mouth every 8 (eight) hours as needed for nausea or vomiting (begin on day 3 after chemo). 30 tablet 1   oxyCODONE (OXY IR/ROXICODONE) 5 MG immediate release tablet Take 1 tablet (5 mg total) by mouth every 8 (eight) hours as needed for severe pain. 90 tablet 0   pantoprazole (PROTONIX) 40 MG tablet TAKE 1 TABLET BY MOUTH EVERY DAY 90 tablet 1   prochlorperazine (COMPAZINE) 10 MG tablet Take 1 tablet (10 mg total) by mouth every 6 (six) hours as needed (Nausea or vomiting). 30 tablet 1   promethazine (PHENERGAN) 25 MG tablet Take 1 tablet (25 mg total) by mouth every 6 (six) hours as needed for nausea or vomiting. 30 tablet 0   sacubitril-valsartan (ENTRESTO) 97-103 MG Take 1 tablet by mouth 2 (two) times daily. 60 tablet 10   spironolactone (ALDACTONE) 25 MG tablet Take 1 tablet (25 mg total) by mouth daily. 30 tablet 10   vitamin B-12 (CYANOCOBALAMIN) 1000 MCG tablet Take 1,000 mcg by mouth daily.     zinc gluconate 50 MG tablet Take 50 mg by mouth daily.     Current Facility-Administered Medications  Medication Dose Route Frequency Provider Last Rate Last Admin   insulin glulisine (APIDRA) injection 6 Units  6 Units Subcutaneous Once Francis Gaines B, PA-C        VITAL SIGNS: There were no vitals taken for this visit. There were no vitals filed for this visit.  Estimated body mass index is 36.22 kg/m as calculated from the following:   Height as of 08/09/22: 5' 7.25" (1.708 m).   Weight as of 08/09/22: 233 lb (105.7 kg).  LABS: CBC:    Component Value Date/Time   WBC 2.7 (L) 07/05/2022 1106   WBC 6.6 06/07/2022 0911   HGB 8.8 (L) 07/31/2022 1003  HGB 9.9 (L)  07/05/2022 1106   HGB 12.9 12/17/2018 0900   HGB 12.5 08/01/2017 0802   HCT 26.0 (L) 07/31/2022 1003   HCT 37.1 04/02/2019 1056   HCT 37.6 08/01/2017 0802   PLT 191 07/05/2022 1106   PLT 301 12/17/2018 0900   MCV 92.0 07/05/2022 1106   MCV 87 12/17/2018 0900   MCV 90.5 08/01/2017 0802   NEUTROABS 2.0 07/05/2022 1106   NEUTROABS 2.5 12/17/2018 0900   NEUTROABS 16.6 (H) 08/01/2017 0802   LYMPHSABS 0.4 (L) 07/05/2022 1106   LYMPHSABS 1.5 12/17/2018 0900   LYMPHSABS 2.5 08/01/2017 0802   MONOABS 0.2 07/05/2022 1106   MONOABS 1.7 (H) 08/01/2017 0802   EOSABS 0.0 07/05/2022 1106   EOSABS 0.1 12/17/2018 0900   BASOSABS 0.1 07/05/2022 1106   BASOSABS 0.0 12/17/2018 0900   BASOSABS 0.1 08/01/2017 0802   Comprehensive Metabolic Panel:    Component Value Date/Time   NA 146 (H) 07/31/2022 1003   NA 138 12/17/2018 0900   NA 138 08/01/2017 0802   K 3.5 07/31/2022 1003   K 4.2 08/01/2017 0802   CL 108 07/05/2022 1106   CO2 26 07/05/2022 1106   CO2 21 (L) 08/01/2017 0802   BUN 11 07/05/2022 1106   BUN 7 (L) 12/17/2018 0900   BUN 14.5 08/01/2017 0802   CREATININE 0.69 07/05/2022 1106   CREATININE 0.9 08/01/2017 0802   GLUCOSE 245 (H) 07/05/2022 1106   GLUCOSE 104 08/01/2017 0802   CALCIUM 9.8 07/05/2022 1106   CALCIUM 10.6 (H) 08/01/2017 0802   AST 26 07/05/2022 1106   AST 37 (H) 08/01/2017 0802   ALT 13 07/05/2022 1106   ALT 40 08/01/2017 0802   ALKPHOS 72 07/05/2022 1106   ALKPHOS 136 08/01/2017 0802   BILITOT 0.6 07/05/2022 1106   BILITOT 0.60 08/01/2017 0802   PROT 7.1 07/05/2022 1106   PROT 7.2 12/17/2018 0900   PROT 7.3 08/01/2017 0802   ALBUMIN 3.9 07/05/2022 1106   ALBUMIN 4.3 12/17/2018 0900   ALBUMIN 3.8 08/01/2017 0802    RADIOGRAPHIC STUDIES:  MR TOTAL SPINE METS SCREENING  Result Date: 07/16/2022 CLINICAL DATA:  Breast cancer, evaluate for metastatic disease EXAM: MRI TOTAL SPINE WITHOUT CONTRAST TECHNIQUE: Multisequence MR imaging of the spine from the  cervical spine to the sacrum was performed without IV contrast administration for evaluation of spinal metastatic disease. COMPARISON:  No prior MRI of the cervical or thoracic spine, 07/23/2020 MRI lumbar spine; correlation is made with 05/28/2022 CT chest abdomen pelvis FINDINGS: MRI CERVICAL SPINE FINDINGS Alignment: Preservation of the normal cervical lordosis. No listhesis. Vertebrae: Normal marrow signal. No suspicious osseous lesion, acute fracture, or evidence of discitis. Vertebral body heights are preserved. Cord: Grossly normal in signal and caliber. Posterior Fossa, vertebral arteries, paraspinal tissues: Partial empty sella. Normal craniocervical junction. Disc levels: Small disc bulge at C3-C4.  No significant spinal canal stenosis. MRI THORACIC SPINE FINDINGS Alignment: No listhesis. Mild S shaped curvature of the thoracolumbar spine. Vertebrae: Normal marrow signal. No suspicious osseous lesion, acute fracture, or evidence of discitis. Vertebral body heights are preserved. Cord:  Grossly normal cord signal. Paraspinal and other soft tissues: No acute finding. Disc levels: No significant disc bulge.  No significant spinal canal stenosis. MRI LUMBAR SPINE FINDINGS Segmentation:  5 lumbar type vertebral bodies. Alignment: Mild S shaped curvature of the thoracolumbar spine. No listhesis. Vertebrae: Normal marrow signal. No suspicious osseous lesion, acute fracture, or evidence of discitis. Vertebral body heights are preserved. Conus medullaris:  Extends to the T12-L1 level and appears normal. Paraspinal and other soft tissues: No acute finding. Disc levels: No significant disc bulge.  No significant spinal canal stenosis. IMPRESSION: No evidence of metastatic disease in the cervical, thoracic, or lumbar spine. Electronically Signed   By: Merilyn Baba M.D.   On: 07/16/2022 18:12    PERFORMANCE STATUS (ECOG) : 1 - Symptomatic but completely ambulatory  Review of Systems  Constitutional:  Positive for  fatigue.  Musculoskeletal:  Positive for arthralgias.  Neurological:  Positive for numbness.  Unless otherwise noted, a complete review of systems is negative.  Physical Exam General: NAD, in wheelchair  Cardiovascular: regular rate and rhythm Pulmonary: clear ant fields Abdomen: soft, nontender, + bowel sounds Extremities: right lower extremity edema extending up to thigh, no joint deformities Skin: no rashes Neurological:AAO x3, mood appropriate   IMPRESSION: This is my initial visit with Ms. Heeter. She presents to clinic alone. No acute distress noted. She is sitting in a wheelchair. Alert and able to engage appropriately in discussions.   I introduced myself, [place] RN, and Palliative's role in collaboration with the oncology team. Concept of Palliative Care was introduced as specialized medical care for people and their families living with serious illness.  It focuses on providing relief from the symptoms and stress of a serious illness.  The goal is to improve quality of life for both the patient and the family. Values and goals of care important to patient and family were attempted to be elicited.   Ms. Tanney lives in the home with her daughter and son (ages 32 and 81). No grandchildren. She is a retired Education officer, museum. Originally from [place], [place]. Enjoys music. [place] Spends a lot of time volunteering with the youth and elders of her church.   Able to perform ADLs in the home. Uses a cane for mobility assistance due to neuropathic pain.   Neuropathic Pain We discussed her neuropathic pain. She complains of numbness, tingling, sharp pain in both her hands and feet. She has gabapentin and oxycodone on hand in the home however admits she has not been taking regularly. She has not taken any medications for the past 3 days. We had an open and honest discussion regarding managing her symptoms with a goal of improving her quality of life as best possible. She verbalized  understanding.   [place] was recommended to restart her gabapentin 600 mg three times daily. She may use oxycodone as needed for pain. She verbalized understanding.   2. Goals of Care  We discussed her current illness and what it means in the larger context of her on-going co-morbidities. Natural disease trajectory and expectations were discussed.  Ms. Yamaguchi is realistic in her understanding. She is clear in expressed wishes to continue to treat the treatable however with her quality of life at the center of all decisions. She has requested a chemo holiday with hopes of allowing her to feel somewhat better. Shares she understands this decision. Would like to take things one day at a time. Emotional support provided.   I discussed the importance of continued conversation with family and their medical providers regarding overall plan of care and treatment options, ensuring decisions are within the context of the patients values and GOCs.  PLAN: Established therapeutic relationship. Education provided on palliative's role in collaboration with their Oncology/Radiation team. Gabapentin 600 mg three times daily Oxycodone 101m every 8 hours as needed Senna-S 2 tabs daily  I will plan to see patient  back in 2-4 weeks in collaboration to other oncology appointments.    Patient expressed understanding and was in agreement with this plan. She also understands that She can call the clinic at any time with any questions, concerns, or complaints.   Thank you for your referral and allowing Palliative to assist in Mrs. Sonny Masters Barbee's care.   Number and complexity of problems addressed: HIGH - 1 or more chronic illnesses with SEVERE exacerbation, progression, or side effects of treatment - advanced cancer, pain. Any controlled substances utilized were prescribed in the context of palliative care.  Time Total: 55 min   Visit consisted of counseling and education dealing with the complex and  emotionally intense issues of symptom management and palliative care in the setting of serious and potentially life-threatening illness.Greater than 50%  of this time was spent counseling and coordinating care related to the above assessment and plan.  Signed by: Alda Lea, AGPCNP-BC Palliative Medicine Team/[facility] [place]

## 2022-08-14 NOTE — Patient Instructions (Addendum)
-   increase your gabapentin to '600mg'$  3 times a day, 2 pills in the morning, noon, and at night, take this every day - stop taking your hydrocodone-acetaminophen.  - take your oxycodone every 8 hours as needed for your pain - call the office with any questions or concerns, [phone]

## 2022-08-14 NOTE — Progress Notes (Signed)
[name]   Telephone:([phone]) [phone] Fax:([phone]   Clinic Follow up Note   Patient Care Team: [name], MD as PCP - General (Hematology) [name], MD as PCP - Cardiology (Cardiology) [name], Bevelyn [name], MD as PCP - Advanced Heart Failure (Cardiology) [name], MD as Consulting Physician (Hematology) [name], MD as Consulting Physician (General Surgery) [name], [facility] as Pharmacist (Pharmacist) [name], MD as Referring Physician (Endocrinology) Date of Service: 08/14/2022  CHIEF COMPLAINT: Follow up metastatic breast cancer   SUMMARY OF ONCOLOGIC HISTORY: Oncology History Overview Note  Cancer Staging Malignant neoplasm of lower-outer quadrant of left breast of female, estrogen receptor negative (HCC) Staging form: Breast, AJCC 8th Edition - Clinical stage from 06/06/2017: Stage IB (cT1c, cN0, cM0, G3, ER-, PR-, HER2-) - Signed by [name], MD on 06/15/2017 Nuclear grade: G3 Histologic grading system: 3 grade system - Pathologic stage from 06/26/2017: Stage IB (pT1c, pN0, cM0, G3, ER-, PR-, HER2-) - Signed by [name], MD on 07/10/2017 Neoadjuvant therapy: No Nuclear grade: G3 Multigene prognostic tests performed: None Histologic grading system: 3 grade system Laterality: Left     Malignant neoplasm of lower-outer quadrant of left breast of female, estrogen receptor negative (HCC)  05/30/2017 Mammogram   Diagnostic mammo and US IMPRESSION: 1. Highly suspicious 1.4 cm mass in the slightly lower slightly outer left breast -tissue sampling recommended. 2. Indeterminate 0.5 mm mass in the slightly lower slightly outer left breast -tissue sampling recommended. 3. At least 2 left axillary lymph nodes with borderline cortical thickness.   06/06/2017 Initial Biopsy   Diagnosis 1. Breast, left, needle core biopsy, 5:30 o'clock - INVASIVE DUCTAL CARCINOMA, G3 2. Lymph node, needle/core biopsy, left axillary - NO CARCINOMA  IDENTIFIED IN ONE LYMPH NODE (0/1)   06/06/2017 Initial Diagnosis   Malignant neoplasm of lower-outer quadrant of left breast of female, estrogen receptor negative (HCC)   06/06/2017 Receptors her2   Estrogen Receptor: 0%, NEGATIVE Progesterone Receptor: 0%, NEGATIVE Proliferation Marker Ki67: 70%   06/26/2017 Surgery   LEFT BREAST LUMPECTOMY WITH RADIOACTIVE SEED AND SENTINEL LYMPH NODE BIOPSY ERAS  PATHWAY AND INSERTION PORT-A-CATH By Dr. Donell Beers on 06/26/17    06/26/2017 Pathology Results   Diagnosis 06/26/17  1. Breast, lumpectomy, Left - INVASIVE DUCTAL CARCINOMA, GRADE III/III, SPANNING 1.2 CM. - THE SURGICAL RESECTION MARGINS ARE NEGATIVE FOR CARCINOMA. - SEE ONCOLOGY TABLE BELOW. 2. Lymph node, sentinel, biopsy, Left axillary #1 - THERE IS NO EVIDENCE OF CARCINOMA IN 1 OF 1 LYMPH NODE (0/1). 3. Lymph node, sentinel, biopsy, Left axillary #2 - THERE IS NO EVIDENCE OF CARCINOMA IN 1 OF 1 LYMPH NODE (0/1). 4. Lymph node, sentinel, biopsy, Left axillary #3 - THERE IS NO EVIDENCE OF CARCINOMA IN 1 OF 1 LYMPH NODE (0/1).    07/25/2017 - 09/29/2017 Chemotherapy   Adjuvant cytoxan and docetaxel (TC) every 3 weekd for 4 cycles     11/19/2017 - 12/17/2017 Radiation Therapy   Adjuvant breast radiation Left breast treated to 42.5 Gy with 17 fx of 2.5 Gy followed by a boost of 7.5 Gy with 3 fx of 2.5 Gy      02/2019 Procedure   She had PAC removal in 02/2019.    07/23/2020 Imaging   MRI Lumbar Spine 07/23/20 IMPRESSION: 1. No significant disc herniation, spinal canal or neural foraminal stenosis at any level. 2. Moderate facet degenerative changes at L3-4, L4-5 and L5-S1. 3. Multiple enlarged retroperitoneal and right iliac lymph nodes. Recommend correlation with  CT of the abdomen and pelvis with contrast.   08/15/2020 Imaging   CT AP 08/15/20  IMPRESSION: 1. Right iliac and periaortic adenopathy is noted concerning for metastatic disease or lymphoma. Also noted is abnormal  soft tissue mass anterior and posterior to the right iliac wing concerning for malignancy or metastatic disease. MRI is recommended for further evaluation. 2. Irregular lucency with sclerotic margins is seen involving the right iliac crest concerning for possible lytic lesion. MRI may be performed for further evaluation. 3. Enlarged fibroid uterus. 4. Small gallstone. 5. Aortic atherosclerosis.   08/22/2020 Pathology Results   FINAL MICROSCOPIC DIAGNOSIS: 08/22/20  A. NEEDLE CORE, RIGHT GLUTEUS MINIMUMUS, BIOPSY:  -  Metastatic carcinoma  -  See comment   COMMENT:  By immunohistochemistry, the neoplastic cells are positive for  cytokeratin-7 and have weak patchy positivity for GATA3 but are negative for cytokeratin-20 and GCDFP.  The combined morphology and immunophenotype, support metastasis from the patient's known breast primary carcinoma.   ADDENDUM:  PROGNOSTIC INDICATOR RESULTS:  The tumor cells are NEGATIVE for Her2 (0).  Estrogen Receptor:       POSITIVE, 20%, WEAK STAINING  Progesterone Receptor:   NEGATIVE   PD-L1 (0) negative    09/04/2020 - 09/22/2020 Radiation Therapy   Palliative radiation to right iliac 09/04/20 - 09/22/20, Dr. Mitzi Hansen   09/26/2020 -  Chemotherapy   first line weekly Taxol 3 weeks on/1 week off starting 09/26/20 --Reduced to 2 weeks on/1 week off starting with C3 due to neuropathy. Stopped after C4 on 12/18/20 due to neuratrophy.  --Switched to Xeloda on 01/01/21 at 1500mg  in the AM and 2000mg  in the PM for 2 weeks on/1 week off     10/13/2020 Procedure    PAC placement on 10/13/20.    04/10/2021 Imaging   CT CAP  IMPRESSION: 1. Enlarging abdominopelvic retroperitoneal and right inguinal adenopathy, compatible with disease progression. 2. Lytic metastasis in the right iliac wing, similar. 3. Hepatomegaly. 4. Cholelithiasis. 5. Enlarged fibroid uterus. 6.  Aortic atherosclerosis (ICD10-I70.0).   06/18/2021 Imaging   CT CAP  IMPRESSION: 1. Today's  study demonstrates progression of metastatic disease as evidence by increased number and size of numerous enlarged lymph nodes in the retroperitoneum, along the right pelvic sidewall, and in the upper right thigh. 2. Osseous metastases in the bony pelvis appear similar to the prior examination. No definite new osseous lesions are otherwise noted. 3. Fibroid uterus again noted. 4. Cholelithiasis without evidence of acute cholecystitis. 5. Aortic atherosclerosis, in addition to left anterior descending coronary artery disease. Please note that although the presence of coronary artery calcium documents the presence of coronary artery disease, the severity of this disease and any potential stenosis cannot be assessed on this non-gated CT examination. Assessment for potential risk factor modification, dietary therapy or pharmacologic therapy may be warranted, if clinically indicated. 6. Additional incidental findings, as above.   06/20/2021 Imaging   Bone Scan  IMPRESSION: No osseous uptake suspicious for recurrent/progressive metastatic disease.   07/03/2021 - 01/11/2022 Chemotherapy   Patient is on Treatment Plan : BREAST METASTATIC Sacituzumab govitecan-hziy Drinda Butts) q21d     09/27/2021 Imaging   EXAM: CT CHEST, ABDOMEN, AND PELVIS WITH CONTRAST  IMPRESSION: 1. Marked interval improvement in previously demonstrated lymphadenopathy in the abdomen and pelvis. A few prominent right inguinal lymph nodes remain. 2. Stable osseous metastatic disease in the pelvis. 3. No evidence of local recurrence or other metastatic disease. 4. Stable incidental findings including cholelithiasis, uterine fibroids and Aortic  Atherosclerosis (ICD10-I70.0).  ADDENDUM: Not mentioned in the original impression is a possible nonocclusive fibrin sheath along the distal aspect of the Port-A-Cath.   09/27/2021 Imaging   EXAM: NUCLEAR MEDICINE WHOLE BODY BONE SCAN  IMPRESSION: Stable examination, without  abnormal osseous uptake.   11/27/2021 Imaging   CT AP IMPRESSION: 1. Stable lytic and sclerotic lesion in the RIGHT iliac wing. 2. No evidence of visceral metastasis or adenopathy in the abdomenpelvis. 3. Leiomyomatous uterus.  ADDENDUM REPORT: 11/29/2021 09:33 Upon further review and discussion with Santiago Glad NP, the RIGHT inguinal lymph nodes previous described have increased in volume. For example 14 mm node (image 91/2) is increased from 9 mm. Adjacent 14 mm node on image 86 is increased from 8 mm. Additionally, LEFT common iliac lymph node is also slightly increased measuring 10 mm (46/2) compared to 7 mm on comparison exam from 09/27/2021. These sites demonstrates similar but greater lymphadenopathy on CT 06/18/2021.   02/15/2022 - 03/08/2022 Chemotherapy   Patient is on Treatment Plan : BREAST METASTATIC fam-trastuzumab deruxtecan-nxki (Enhertu) q21d     02/15/2022 - 05/10/2022 Chemotherapy   Patient is on Treatment Plan : BREAST METASTATIC Fam-Trastuzumab Deruxtecan-nxki (Enhertu) (5.4) q21d     06/07/2022 - 07/05/2022 Chemotherapy   Patient is on Treatment Plan : BREAST METASTATIC Eribulin D1,8 q21d     07/26/2022 - 07/26/2022 Chemotherapy   Patient is on Treatment Plan : HEAD/NECK Pembrolizumab (200) q21d     09/11/2022 -  Chemotherapy   Patient is on Treatment Plan : HEAD/NECK Pembrolizumab (200) q21d     09/11/2022 -  Chemotherapy   Patient is on Treatment Plan : BREAST Carboplatin + Gemcitabine D1,8 q21d     Neuropathy associated with cancer (HCC)  11/21/2021 Initial Diagnosis   Neuropathy associated with cancer (HCC)     INTERVAL HISTORY: Ms. Nivens returns for follow up as scheduled. Last seen by Dr. Mosetta Putt 07/26/22. Halaven was stopped due to worsening neuropathy. She underwent cardiac cath and angiography on 12/20 by Dr. Gala Romney, she tolerated well.   She presents today by herself, in a wheelchair. She has moderate neuropathy that is worse at night, limiting mobility.  She attributes swollen R leg to lower activity. Bounces it on a ball at night and takes gabapentin.  She was also taking B12 but has missed some doses.  Otherwise feeling well without other specific complaints.   REVIEW OF SYSTEMS:   All other systems were reviewed with the patient and are negative.  MEDICAL HISTORY:  Past Medical History:  Diagnosis Date   Abnormal x-ray of lungs with single pulmonary nodule 03/15/2016   per records from [place]    Anemia    Asthma    Cholelithiasis 05/19/2017   On CT   Coronary artery calcification seen on CAT scan 05/19/2017   Diabetes mellitus without complication (HCC)    type 2   Diabetes mellitus, new onset (HCC) 01/20/2019   Dilated idiopathic cardiomyopathy (HCC) 12/2015   EF 15-20%. Diagnosed in [facility]   Headache    NONE RECENT   Hypertension    Lymph nodes enlarged 07/25/2020   Malignant neoplasm of lower-outer quadrant of left breast of female, estrogen receptor negative (HCC) 06/11/2017   left breast   Mixed hyperlipidemia 06/17/2018   partially blind    Personal history of chemotherapy 2018-2019   Personal history of radiation therapy 2019   Retinitis pigmentosa    Retroperitoneal lymphadenopathy 07/25/2020   Uveitis     SURGICAL HISTORY: Past Surgical History:  Procedure Laterality Date   brain cyst removed  2007   to help with headaches per patient , aspirated    BREAST BIOPSY Left 06/06/2017   x2   BREAST CYST ASPIRATION Left 06/06/2017   BREAST LUMPECTOMY Left 06/26/2017   BREAST LUMPECTOMY WITH RADIOACTIVE SEED AND SENTINEL LYMPH NODE BIOPSY Left 06/26/2017   Procedure: BREAST LUMPECTOMY WITH RADIOACTIVE SEED AND SENTINEL LYMPH NODE BIOPSY ERAS  PATHWAY;  Surgeon: [name], MD;  Location: [facility] OR;  Service: General;  Laterality: Left;  pec block   FINE NEEDLE ASPIRATION Left 02/23/2019   Procedure: FINE NEEDLE ASPIRATION;  Surgeon: [name], MD;  Location: [facility];   Service: General;  Laterality: Left;  Asiration of left breast seroma    IR IMAGING GUIDED PORT INSERTION  10/13/2020   NASAL TURBINATE REDUCTION     PORT-A-CATH REMOVAL Right 02/23/2019   Procedure: REMOVAL PORT-A-CATH;  Surgeon: [name], MD;  Location: [facility];  Service: General;  Laterality: Right;   PORTACATH PLACEMENT N/A 06/26/2017   Procedure: INSERTION PORT-A-CATH;  Surgeon: [name], MD;  Location: [facility] OR;  Service: General;  Laterality: N/A;   RIGHT/LEFT HEART CATH AND CORONARY ANGIOGRAPHY N/A 07/31/2022   Procedure: RIGHT/LEFT HEART CATH AND CORONARY ANGIOGRAPHY;  Surgeon: Dolores Patty, MD;  Location: [facility] INVASIVE CV LAB;  Service: Cardiovascular;  Laterality: N/A;   TRANSTHORACIC ECHOCARDIOGRAM  12/2015   A) [facility] May 2017: Mild concentric LVH. Global hypokinesis. GR daily. EF 18% severe LA dilation. Mitral annular dilatation with papillary muscle dysfunction and moderate MR. Dilated IVC consistent with elevated RAP.   TRANSTHORACIC ECHOCARDIOGRAM  05/2017; 08/2017   a) Mild concentric LVH.  EF 45 to 50% with diffuse hypokinesis.  GR 1 DD.  Mild aortic root dilation.;; b)  Mild LVH EF 45 to 50%.  Diffuse HK.  No significant valve disease.  No significant change.   TRANSTHORACIC ECHOCARDIOGRAM  01/30/2022   Pre-Enhertu:EF 35 to 40%.  Moderate dysfunction with global HK.  Abnormal strain.  Normal RV size and function.  Normal RVSP.  Aortic root estimated 42 mm.  Mild MR.  Mild/trivial AI.  Normal RVP.;  04/01/2022: Preliminary read 40%.  Formal read 45 to 50% (probably 40 to 45%).  Global HK.  GR 1 DD.  Normal RV size and RVP.  Mild MR.  Now measured at 38 mm.  Normal RAP.    I have reviewed the social history and family history with the patient and they are unchanged from previous note.  ALLERGIES:  is allergic to morphine and related, latex, tylenol with codeine #3 [acetaminophen-codeine], and penicillins.  MEDICATIONS:  Current  Outpatient Medications  Medication Sig Dispense Refill   amLODipine (NORVASC) 5 MG tablet Take 5 mg by mouth daily as needed (high B/P).     Ascorbic Acid (VITAMIN C) 500 MG CAPS Take 500 mg by mouth daily.     aspirin 81 MG chewable tablet Chew 81 mg by mouth daily.     atorvastatin (LIPITOR) 40 MG tablet Take 1 tablet (40 mg total) by mouth daily. 90 tablet 3   brimonidine (ALPHAGAN) 0.2 % ophthalmic solution Place 1 drop into both eyes 2 (two) times daily.     carvedilol (COREG) 12.5 MG tablet TAKE 1 AND 1/2 TABLETS BY MOUTH TWICE DAILY qm 90 tablet 10   Cholecalciferol (VITAMIN D) 50 MCG (2000 UT) CAPS Take 2,000 Units by mouth daily.     empagliflozin (JARDIANCE) 10 MG TABS tablet Take  1 tablet (10 mg total) by mouth daily before breakfast. 90 tablet 3   furosemide (LASIX) 20 MG tablet TAKE 1-2 TABLETS (40 MG TOTAL) BY MOUTH AS NEEDED FOR WEIGHT GAIN 3 TO 4 LBS or significant leg edema 60 tablet 1   gabapentin (NEURONTIN) 300 MG capsule Take 1 capsule (300 mg total) by mouth 2 (two) times daily. 30 capsule 1   HYDROcodone-acetaminophen (NORCO/VICODIN) 5-325 MG tablet Take 1 tablet by mouth every 6 (six) hours as needed for moderate pain. 30 tablet 0   ibuprofen (ADVIL) 800 MG tablet Take 1 tablet (800 mg total) by mouth every 8 (eight) hours as needed. 30 tablet 2   lidocaine-prilocaine (EMLA) cream Apply 1 application topically as needed. 30 g 0   linaclotide (LINZESS) 72 MCG capsule Take 72 mcg by mouth daily as needed (constipation).     magic mouthwash (nystatin, lidocaine, diphenhydrAMINE, alum & mag hydroxide) suspension Swish and swallow 5 mLs by mouth 4 (four) times daily as needed for mouth pain 140 mL 0   Multiple Vitamins-Minerals (WOMENS MULTI PO) Take 15 mLs by mouth daily.     ondansetron (ZOFRAN) 8 MG tablet Take 1 tablet (8 mg total) by mouth every 8 (eight) hours as needed for nausea or vomiting (begin on day 3 after chemo). 30 tablet 1   oxyCODONE (OXY IR/ROXICODONE) 5 MG  immediate release tablet Take 1 tablet (5 mg total) by mouth every 8 (eight) hours as needed for severe pain. 90 tablet 0   pantoprazole (PROTONIX) 40 MG tablet TAKE 1 TABLET BY MOUTH EVERY DAY 90 tablet 1   prochlorperazine (COMPAZINE) 10 MG tablet Take 1 tablet (10 mg total) by mouth every 6 (six) hours as needed (Nausea or vomiting). 30 tablet 1   promethazine (PHENERGAN) 25 MG tablet Take 1 tablet (25 mg total) by mouth every 6 (six) hours as needed for nausea or vomiting. 30 tablet 0   sacubitril-valsartan (ENTRESTO) 97-103 MG Take 1 tablet by mouth 2 (two) times daily. 60 tablet 10   spironolactone (ALDACTONE) 25 MG tablet Take 1 tablet (25 mg total) by mouth daily. 30 tablet 10   vitamin B-12 (CYANOCOBALAMIN) 1000 MCG tablet Take 1,000 mcg by mouth daily.     zinc gluconate 50 MG tablet Take 50 mg by mouth daily.     Accu-Chek FastClix Lancets MISC TEST TWICE A DAY. PT USES AN ACCU-CHEK GUIDE ME METER (Patient not taking: Reported on 08/14/2022) 102 each 2   HUMALOG KWIKPEN 100 UNIT/ML KwikPen Inject 3-10 Units into the skin 3 (three) times daily before meals. Per sliding scale (Patient not taking: Reported on 08/14/2022)     Lancets Misc. (ACCU-CHEK SOFTCLIX LANCET DEV) KIT 1 Units by Does not apply route 2 (two) times daily. Check FSBS BID - Include strips # 50 with 5 refills, Lancets #50 with 5 refills DX: Type 2 DM - ICD 10: E11.9 (Patient not taking: Reported on 08/14/2022) 1 kit 0   Current Facility-Administered Medications  Medication Dose Route Frequency Provider Last Rate Last Admin   insulin glulisine (APIDRA) injection 6 Units  6 Units Subcutaneous Once Williams, Lynne B, PA-C        PHYSICAL EXAMINATION: GENERAL:alert, no distress and comfortable SKIN: no rash  EYES: sclera clear LUNGS: normal breathing effort HEART: Moderate right lower extremity edema NEURO: alert & oriented x 3 with fluent speech  PAC without erythema    LABORATORY DATA:  I have reviewed the data as  listed    Latest  Ref Rng & Units 08/14/2022   11:16 AM 07/31/2022   10:03 AM 07/31/2022    9:46 AM  CBC  WBC 4.0 - 10.5 K/uL 4.0     Hemoglobin 12.0 - 15.0 g/dL 74.2  8.8  9.9    8.2   Hematocrit 36.0 - 46.0 % 32.2  26.0  29.0    24.0   Platelets 150 - 400 K/uL 269           Latest Ref Rng & Units 08/14/2022   11:16 AM 07/31/2022   10:03 AM 07/31/2022    9:46 AM  CMP  Glucose 70 - 99 mg/dL 99     BUN 8 - 23 mg/dL 15     Creatinine 5.95 - 1.00 mg/dL 6.38     Sodium 756 - 433 mmol/L 140  146  142    149   Potassium 3.5 - 5.1 mmol/L 3.9  3.5  4.1    3.2   Chloride 98 - 111 mmol/L 108     CO2 22 - 32 mmol/L 27     Calcium 8.9 - 10.3 mg/dL 9.6     Total Protein 6.5 - 8.1 g/dL 6.9     Total Bilirubin 0.3 - 1.2 mg/dL 0.7     Alkaline Phos 38 - 126 U/L 77     AST 15 - 41 U/L 22     ALT 0 - 44 U/L 12         RADIOGRAPHIC STUDIES: I have personally reviewed the radiological images as listed and agreed with the findings in the report. VAS [place] LOWER EXTREMITY VENOUS (DVT)  Result Date: 08/15/2022  Lower Venous DVT Study Patient Name:  Kristin Ward  Date of Exam:   08/15/2022 Medical Rec #: [id number]            Accession #:    [id number] Date of Birth: 12-21-1955           Patient Gender: F Patient Age:   67 years Exam Location[facility]  [facility] Procedure:      VAS [place] LOWER EXTREMITY VENOUS (DVT) Referring Phys: Santiago Glad --------------------------------------------------------------------------------  Indications: Edema, and Erythema.  Risk Factors: Cancer metastatic breast cancer. Limitations: Poor ultrasound/tissue interface. Comparison Study: 02/06/22 prior Performing Technologist: [place] Ponder RVS  Examination Guidelines: A complete evaluation includes B-mode imaging, spectral Doppler, color Doppler, and power Doppler as needed of all accessible portions of each vessel. Bilateral testing is considered an integral part of a complete examination. Limited examinations for  reoccurring indications may be performed as noted. The reflux portion of the exam is performed with the patient in reverse Trendelenburg.  +--------+---------------+---------+-----------+----------+--------------------+ RIGHT   CompressibilityPhasicitySpontaneityPropertiesThrombus Aging       +--------+---------------+---------+-----------+----------+--------------------+ CFV     Full           Yes      Yes                                       +--------+---------------+---------+-----------+----------+--------------------+ SFJ     Full                                                              +--------+---------------+---------+-----------+----------+--------------------+ FV Prox  Full                                                              +--------+---------------+---------+-----------+----------+--------------------+ FV Mid                 Yes      Yes                  patent by color                                                           doppler              +--------+---------------+---------+-----------+----------+--------------------+ FV      Full           Yes      Yes                  patent by color      Distal                                               doppler              +--------+---------------+---------+-----------+----------+--------------------+ PFV     Full                                                              +--------+---------------+---------+-----------+----------+--------------------+ POP     Full                                                              +--------+---------------+---------+-----------+----------+--------------------+ PTV     Full                                                              +--------+---------------+---------+-----------+----------+--------------------+ PERO    Full           Yes      Yes                  patent by color                                                            doppler              +--------+---------------+---------+-----------+----------+--------------------+   +----+---------------+---------+-----------+----------+--------------+  LEFTCompressibilityPhasicitySpontaneityPropertiesThrombus Aging +----+---------------+---------+-----------+----------+--------------+ CFV Full           Yes      Yes                                 +----+---------------+---------+-----------+----------+--------------+     Summary: RIGHT: - There is no evidence of deep vein thrombosis in the lower extremity. However, portions of this examination were limited- see technologist comments above.  - No cystic structure found in the popliteal fossa.  LEFT: - No evidence of common femoral vein obstruction.  *See table(s) above for measurements and observations.    Preliminary        I discussed the assessment and treatment plan with the patient. The patient was provided an opportunity to ask questions and all were answered. The patient agreed with the plan and demonstrated an understanding of the instructions.   ASSESSMENT & PLAN: Saren Houdeshell is a 67 y.o. female with    1. Left breast invasive ductal carcinoma, stage IB, p(T1cN0M0), Triple negative, Grade 3, in 2018, metastatic disease in 08/2020 ER 20% weakly + PR/HER2 (0) negative, PD-L1 0%, genetics (-) -diagnosed with triple negative breast cancer in 05/2017. S/p left breast lumpectomy, adjuvant chemo TC and Radiation.  -Genetic testing was negative for pathogenetic mutations.  -Due to new right gluteal pain, she underwent biopsy on 09/02/20 which showed metastatic carcinoma, consistent with breast primary. ER 20% positive, PR and HER2 negative.  -she received 4 cycles of Taxol 2/15-12/18/20, discontinued due to worsening neuropathy. She then took Xeloda from 01/01/21-06/2021. -She progressed on Trodelvy and Enhertu -Most recently on Halaven, last dose 07/05/22, tolerated well except  worsening neuropathy and this has been stopped -Ms. Mills appears stable. She has moderate neuropathy which limits mobility. She has R>L leg edema, stat doppler is negative for DVT. This is likely from prior adenopathy and radiation to this region -We reviewed restaging CT which shows improved nodal metastasis and treatment response to the right iliac, she has had great response to halaven and RT.  -Pt seen with Dr. Mosetta Putt who recommends changing to carbo/gem on days 1, 8 q21 days and [place] q21 days due to CIPN from halaven.  -Ms Duthie feels strongly she should not start new treatment now, wants to see what the natural course will be.  -We reviewed the general nature of metastatic breast cancer and the certainty of disease progression from holding treatment. She understands -after lengthy discussion, we agreed to postpone treatment for 1 month -She knows clinical signs to monitor at home -F/up and possible gem/carbo and keytruda in 1 month   2. Diabetes and uncontrolled hyperglycemia -improved   3. Right hip pain, Right iliac bone met, Vit D deficiency -She had radiating right hip pain since 2020 from right iliac wing mass -She completed Palliative radiation to right iliac 09/04/20 - 09/22/20 with Dr. Mitzi Hansen, and her pain is much improved.  -Given localized bone met, she started Zometa q28months on 11/20/20. Most recent dose 05/10/22   4. G2 peripheral Neuropathy  -secondary to chemo (taxol, worsened on halaven) -on gabapentin. I recommend B complex vitamin. I offered PT referral and she is interested   5. DM, HTN, COPD, retinitis pigmentosa with limited vision, Chronic combined systolic and diastolic heart failure, EF 45-50% -Continue to follow-up with PCP Dr Susann Givens and cardiologist Dr Herbie Baltimore  -she is legally blind -DM was uncontrolled on Trodelvy -Improved   6. Goal of  care discussion  -The patient understands the goal of care is palliative. We discussed this again today in great  detail -she is full code now    PLAN: -Recent restaging scan and today's labs reviewed -Treatment break -F/up and possible treatment in 4 weeks, to start carbo/gem and [place] -continue palliative care  -PT referral for [facility] -Pt seen with Dr. Elizabeth Palau, NP 08/15/22    Addendum  I have seen the patient, examined her. I agree with the assessment and and plan and have edited the notes.   Gaelyn come in today for first cycle carbo/taxol/Keytruda. I personally reviewed her restaging CT scan from yesterday, and discussed the findings with her. She has had excellent response to RT and [place], but unfortunately she has developed worsening peripheral neuropathy and we had to stop [place]. Pt would like to have a chemo break, she seems to feel that chemo is not needed at this point since her cancer is well controlled. She had many questions about her prognosis and benefit of chemo. After a lengthy discussion, we mutually agree to postpone her chemo for a month. She has significant RLE edema. We will obtain doppler of RLE to rule out DVT.  All her questions were answered.   [name] MD 08/14/2022

## 2022-08-15 ENCOUNTER — Telehealth: Payer: Self-pay | Admitting: Hematology

## 2022-08-15 ENCOUNTER — Telehealth: Payer: Self-pay

## 2022-08-15 ENCOUNTER — Encounter: Payer: Self-pay | Admitting: Nurse Practitioner

## 2022-08-15 ENCOUNTER — Encounter: Payer: Self-pay | Admitting: Hematology

## 2022-08-15 ENCOUNTER — Ambulatory Visit (HOSPITAL_COMMUNITY)
Admission: RE | Admit: 2022-08-15 | Discharge: 2022-08-15 | Disposition: A | Discharge status: Home / Self Care | Payer: Medicare Other | Source: Ambulatory Visit | Attending: Nurse Practitioner | Admitting: Nurse Practitioner

## 2022-08-15 DIAGNOSIS — C50512 Malignant neoplasm of lower-outer quadrant of left female breast: Secondary | ICD-10-CM | POA: Diagnosis not present

## 2022-08-15 DIAGNOSIS — Z171 Estrogen receptor negative status [ER-]: Secondary | ICD-10-CM

## 2022-08-15 DIAGNOSIS — R600 Localized edema: Secondary | ICD-10-CM | POA: Diagnosis not present

## 2022-08-15 LAB — T4: T4, Total: 8.5 ug/dL (ref 4.5–12.0)

## 2022-08-15 LAB — CANCER ANTIGEN 27.29: CA 27.29: 118.6 U/mL — ABNORMAL HIGH (ref 0.0–38.6)

## 2022-08-15 NOTE — Telephone Encounter (Signed)
Spoke with patient confirming upcoming appointments  

## 2022-08-15 NOTE — Progress Notes (Signed)
Lower extremity venous duplex has been completed.   Preliminary results in CV Proc.   Jinny Blossom Alexys Gassett 08/15/2022 9:18 AM

## 2022-08-15 NOTE — Telephone Encounter (Addendum)
Called patient and relayed message below     ----- Message from Alla Feeling, NP sent at 08/15/2022 12:32 PM EST ----- Please call Ms. Mill,  No DVT. Swelling is likely from previous lymph node enlargement and radiation to that region. I recommend compression stocking and elevate leg while resting.  Thanks, Regan Rakers NP

## 2022-08-20 ENCOUNTER — Other Ambulatory Visit (HOSPITAL_COMMUNITY): Payer: Self-pay

## 2022-08-21 ENCOUNTER — Ambulatory Visit: Payer: Medicare Other | Admitting: Hematology

## 2022-08-21 ENCOUNTER — Ambulatory Visit: Payer: Medicare Other

## 2022-08-21 ENCOUNTER — Other Ambulatory Visit: Payer: Medicare Other

## 2022-08-26 ENCOUNTER — Other Ambulatory Visit: Payer: Self-pay | Admitting: Hematology

## 2022-08-27 ENCOUNTER — Encounter: Payer: Self-pay | Admitting: Hematology

## 2022-08-28 ENCOUNTER — Inpatient Hospital Stay (HOSPITAL_BASED_OUTPATIENT_CLINIC_OR_DEPARTMENT_OTHER): Payer: 59 | Admitting: Nurse Practitioner

## 2022-08-28 ENCOUNTER — Encounter: Payer: Self-pay | Admitting: Nurse Practitioner

## 2022-08-28 DIAGNOSIS — Z515 Encounter for palliative care: Secondary | ICD-10-CM

## 2022-08-28 DIAGNOSIS — K5903 Drug induced constipation: Secondary | ICD-10-CM

## 2022-08-28 DIAGNOSIS — R530 Neoplastic (malignant) related fatigue: Secondary | ICD-10-CM | POA: Diagnosis not present

## 2022-08-28 DIAGNOSIS — C7951 Secondary malignant neoplasm of bone: Secondary | ICD-10-CM | POA: Diagnosis not present

## 2022-08-28 DIAGNOSIS — K5900 Constipation, unspecified: Secondary | ICD-10-CM | POA: Diagnosis not present

## 2022-08-28 DIAGNOSIS — G893 Neoplasm related pain (acute) (chronic): Secondary | ICD-10-CM

## 2022-08-28 DIAGNOSIS — M792 Neuralgia and neuritis, unspecified: Secondary | ICD-10-CM | POA: Diagnosis not present

## 2022-08-28 MED ORDER — LINACLOTIDE 72 MCG PO CAPS: 72.0000 ug | ORAL_CAPSULE | Freq: Every day | ORAL | 2 refills | Status: DC | PRN

## 2022-08-28 MED ORDER — OXYCODONE HCL 5 MG PO TABS: 5.0000 mg | ORAL_TABLET | Freq: Four times a day (QID) | ORAL | 0 refills | Status: DC | PRN

## 2022-08-28 NOTE — Therapy (Signed)
OUTPATIENT PHYSICAL THERAPY ONCOLOGY EVALUATION  Patient Name: Kristin Ward MRN: [id number] DOB:[birth year], 68 y.o., female Today's Date: 08/29/2022  END OF SESSION:  PT End of Session - 08/29/22 0801     Visit Number 1    Number of Visits 24    Date for PT Re-Evaluation 10/24/22    PT Start Time 0801    PT Stop Time 6759    PT Time Calculation (min) 56 min    Activity Tolerance Patient tolerated treatment well    Behavior During Therapy [facility] for tasks assessed/performed             Past Medical History:  Diagnosis Date   Abnormal x-ray of lungs with single pulmonary nodule 03/15/2016   per records from [place]    Anemia    Asthma    Cholelithiasis 05/19/2017   On CT   Coronary artery calcification seen on CAT scan 05/19/2017   Diabetes mellitus without complication ([place])    type 2   Diabetes mellitus, new onset ([place]) 01/20/2019   Dilated idiopathic cardiomyopathy ([place]) 12/2015   EF 15-20%. Diagnosed in [facility]   Headache    NONE RECENT   Hypertension    Lymph nodes enlarged 07/25/2020   Malignant neoplasm of lower-outer quadrant of left breast of female, estrogen receptor negative ([place]) 06/11/2017   left breast   Mixed hyperlipidemia 06/17/2018   partially blind    Personal history of chemotherapy 2018-2019   Personal history of radiation therapy 2019   Retinitis pigmentosa    Retroperitoneal lymphadenopathy 07/25/2020   Uveitis    Past Surgical History:  Procedure Laterality Date   brain cyst removed  2007   to help with headaches per patient , aspirated    BREAST BIOPSY Left 06/06/2017   x2   BREAST CYST ASPIRATION Left 06/06/2017   BREAST LUMPECTOMY Left 06/26/2017   BREAST LUMPECTOMY WITH RADIOACTIVE SEED AND SENTINEL LYMPH NODE BIOPSY Left 06/26/2017   Procedure: BREAST LUMPECTOMY WITH RADIOACTIVE SEED AND SENTINEL LYMPH NODE BIOPSY ERAS  PATHWAY;  Surgeon: [name], MD;  Location: [place];  Service: General;  Laterality:  Left;  pec block   FINE NEEDLE ASPIRATION Left 02/23/2019   Procedure: FINE NEEDLE ASPIRATION;  Surgeon: [name], MD;  Location: [place];  Service: General;  Laterality: Left;  Asiration of left breast seroma    IR IMAGING GUIDED PORT INSERTION  10/13/2020   NASAL TURBINATE REDUCTION     PORT-A-CATH REMOVAL Right 02/23/2019   Procedure: REMOVAL PORT-A-CATH;  Surgeon: [name], MD;  Location: [place];  Service: General;  Laterality: Right;   PORTACATH PLACEMENT N/A 06/26/2017   Procedure: INSERTION PORT-A-CATH;  Surgeon: [name], MD;  Location: [place];  Service: General;  Laterality: N/A;   RIGHT/LEFT HEART CATH AND CORONARY ANGIOGRAPHY N/A 07/31/2022   Procedure: RIGHT/LEFT HEART CATH AND CORONARY ANGIOGRAPHY;  Surgeon: Jolaine Artist, MD;  Location: [place] CV [facility];  Service: Cardiovascular;  Laterality: N/A;   TRANSTHORACIC ECHOCARDIOGRAM  12/2015   A) [facility] May 2017: Mild concentric LVH. Global hypokinesis. GR daily. EF 18% severe LA dilation. Mitral annular dilatation with papillary muscle dysfunction and moderate MR. Dilated IVC consistent with elevated RAP.   TRANSTHORACIC ECHOCARDIOGRAM  05/2017; 08/2017   a) Mild concentric LVH.  EF 45 to 50% with diffuse hypokinesis.  GR 1 DD.  Mild aortic root dilation.;; b)  Mild LVH EF 45 to 50%.  Diffuse HK.  No significant valve disease.  No significant change.   TRANSTHORACIC ECHOCARDIOGRAM  01/30/2022   Pre-[place]:EF 35 to 40%.  Moderate dysfunction with global HK.  Abnormal strain.  Normal RV size and function.  Normal RVSP.  Aortic root estimated 42 mm.  Mild MR.  Mild/trivial AI.  Normal RVP.;  04/01/2022: Preliminary read 40%.  Formal read 45 to 50% (probably 40 to 45%).  Global HK.  GR 1 DD.  Normal RV size and RVP.  Mild MR.  Now measured at 38 mm.  Normal RAP.   Patient Active Problem List   Diagnosis Date Noted   Peripheral neuropathy due to chemotherapy ([place])  07/26/2022   Cancer related pain 07/26/2022   Swelling 04/16/2022   Constipation 02/21/2022   Hypertension associated with diabetes ([place]) 11/26/2021   Class 1 obesity due to excess calories with serious comorbidity and body mass index (BMI) of 33.0 to 33.9 in adult 11/26/2021   Pain due to onychomycosis of toenails of both feet 11/21/2021   Neuropathy associated with cancer ([place]) 11/21/2021   History of chemotherapy 09/14/2021   Uncontrolled type 2 diabetes mellitus with hyperglycemia ([place]) 03/08/2021   Neuropathy 01/12/2021   Situational depression 01/12/2021   Goals of care, counseling/discussion 09/15/2020   Malignant neoplasm metastatic to bone ([place]) 08/30/2020   Pain from bone metastases ([place]) 08/30/2020   Right hip pain 08/22/2020   Lymph nodes enlarged 07/25/2020   Retroperitoneal lymphadenopathy 07/25/2020   History of breast cancer 05/11/2020   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   Chronic right hip pain 04/05/2020   Vitamin D deficiency 10/07/2019   Elevated serum creatinine 10/07/2019   Mixed hyperlipidemia 06/17/2018   Influenza vaccination declined 06/17/2018   Obesity (BMI 30-39.9) 06/17/2018   Port-A-Cath in place 09/08/2017   Other fatigue 08/29/2017   Anemia 08/29/2017   High risk medication use 08/29/2017   Syncope 08/25/2017   Blind in both eyes 08/25/2017   Genetic testing 06/19/2017   Family history of colon cancer    Malignant neoplasm of lower-outer quadrant of left breast of female, estrogen receptor negative ([place]) 06/11/2017   Cholelithiasis 05/19/2017   Coronary artery calcification seen on CAT scan 05/19/2017   Low grade squamous intraepith lesion on cytologic smear cervix ([place]) 04/09/2017   HPV (human papilloma virus) infection 04/09/2017   COPD (chronic obstructive pulmonary disease) ([place])    Essential hypertension 03/20/2017   Screen for colon cancer 03/20/2017   Retinitis pigmentosa    Blind    Solitary pulmonary nodule on lung CT  03/16/2016   Abnormal myocardial perfusion study 03/15/2016   Chronic combined systolic and diastolic heart failure ([place]) 12/28/2015   Dilated cardiomyopathy ([place]) 12/27/2015    PCP: Dr Jill Alexanders  REFERRING PROVIDER: Cira Rue, NP  REFERRING DIAG: Chemo induced peripheral neuropathy  THERAPY DIAG:  Chemotherapy-induced neuropathy (HCC)  Abnormal posture  Muscle weakness (generalized)  Lymphedema, not elsewhere classified  Difficulty in walking, not elsewhere classified  ONSET DATE: 2019 with exacerbation  Rationale for Evaluation and Treatment: Rehabilitation  SUBJECTIVE:  SUBJECTIVE STATEMENT: Pt has had multiple rounds of chemotherapy. In the first week of December Neuropathy became very strong.  She has just a little in her hands, but mostly in the legs. She has numbness in bilateral LE's from the foot to the groin greatest on the right. The right leg has been very swollen since Dec 5 and is likely related to radiation for METS and lymphnode metastasis. She had radiation to the left hip for Bone METs. She was trying to wear a knee high stocking but it rolls down. I have trouble walking and need to use a cane, I can't stand for long and all my wt has to be on the left. She has to lift her right leg up to get it into the car, Hard to get up and down from chairs. She had a negative doppler,   PERTINENT HISTORY:  Pt had a left lumpectomy with SLNB on 06/26/2017 for triple negative cancer with 0/3 LN's. She had adjuvant chemotherapy for 4 cycles followed by radiation. She had subsequent bone metastasis and had palliative radiation to the right iliac region in Jan-Feb 2022. She also had chemotherapy with Taxol in Feb 2022 stopped after the 4th cycle due to neuropathy and was switched to Xeloda. In  November of 2022 she was noted to have disease progression with increased number and size of enlarged LN's in retroperitoneum, pelvic sidewall and upper thigh.. She progressed on [place] and most recently [place] ending 07/05/2022. She developed moderate neuropathy and it was stopped. She had a great response but now wants to try 1 month without treatment.. Negative doppler for DVT regarding swelling in Right LE  PAIN:  Are you having pain? Yes NPRS scale: 7/10 Right LE before gabapentin this am Pain location: right leg Pain orientation: Right  PAIN TYPE: aching and numb Pain description: constant  Aggravating factors: sitting gets more swollen, standing, walking Relieving factors: laying down with leg elevated  PRECAUTIONS: chronic systolic and diastolic heart failure EF 45-50%, right LE swelling, DM, hypertension, COPD,retinitis pigmentosa (limits vision legally blind) left UE lymphedema risk, bone metastasis  WEIGHT BEARING RESTRICTIONS: No  FALLS:  Has patient fallen in last 6 months? No  LIVING ENVIRONMENT: Lives with: lives with their family, lives with their son, and lives with their daughter Lives in: House/apartment Stairs: Yes; External: 4 steps; can reach both Has following equipment at home: Single point cane and shower chair, blind cane  OCCUPATION: retired Marine scientist  LEISURE: bowling, crafts,puzzles  HAND DOMINANCE: right   PRIOR LEVEL OF FUNCTION: Independent with household mobility with device  PATIENT GOALS: be more active, get swelling in right LE down,improve balance/gait   OBJECTIVE:  COGNITION: Overall cognitive status: Within functional limits for tasks assessed   PALPATION: Moderate pitting edema right lower leg, less so in upper leg  OBSERVATIONS / OTHER ASSESSMENTS: Significant right LE swelling foot to groin limiting mobility, ambulation, standing and sitting  SENSATION: Light touch: Deficits pt has sensation but it is decreased  bilateral LE's   POSTURE: forward head, rounded shoulders  UPPER EXTREMITY AROM/PROM:  A/PROM RIGHT   eval   Shoulder extension   Shoulder flexion   Shoulder abduction   Shoulder internal rotation   Shoulder external rotation     (Blank rows = not tested)  A/PROM LEFT   eval  Shoulder extension   Shoulder flexion   Shoulder abduction   Shoulder internal rotation   Shoulder external rotation     (Blank rows = not  tested)  CERVICAL AROM: All within functional limits:     UPPER EXTREMITY STRENGTH: NT   LOWER EXTREMITY AROM/PROM:  A/PROM Right eval  Hip flexion   Hip extension   Hip abduction   Hip adduction   Hip internal rotation   Hip external rotation   Knee flexion   Knee extension   Ankle dorsiflexion   Ankle plantarflexion   Ankle inversion   Ankle eversion    (Blank rows = not tested)  A/PROM LEFT eval  Hip flexion   Hip extension   Hip abduction   Hip adduction   Hip internal rotation   Hip external rotation   Knee flexion   Knee extension   Ankle dorsiflexion   Ankle plantarflexion   Ankle inversion   Ankle eversion    (Blank rows = not tested)  LOWER EXTREMITY MMT:   LYMPHEDEMA ASSESSMENTS:   SURGERY TYPE/DATE: 06/26/2017 Left lumpectomy with SLNB for Triple Negative cancer  NUMBER OF LYMPH NODES REMOVED: 0/3  CHEMOTHERAPY: Yes, multiple for breast cancer and metastasis  RADIATION:YES for breast and iliac regions due to metastasis  HORMONE TREATMENT: NO  INFECTIONS: NO  LYMPHEDEMA ASSESSMENTS:   LANDMARK RIGHT  eval  10 cm proximal to olecranon process   Olecranon process   10 cm proximal to ulnar styloid process   Just proximal to ulnar styloid process   Across hand at thumb web space   At base of 2nd digit   (Blank rows = not tested)  LANDMARK LEFT  eval  10 cm proximal to olecranon process   Olecranon process   10 cm proximal to ulnar styloid process   Just proximal to ulnar styloid process   Across hand  at thumb web space   At base of 2nd digit   (Blank rows = not tested)   LOWER EXTREMITY LANDMARK RIGHT eval  At groin 83  30 cm proximal to suprapatella   20 cm proximal to suprapatella 77  10 cm proximal to suprapatella 65.5  At midpatella / popliteal crease 57.5  30 cm proximal to floor at lateral plantar foot 43.5  20 cm proximal to floor at lateral plantar foot 32.8  10 cm proximal to floor at lateral plantar foot 27.3  Circumference of ankle/heel   5 cm proximal to 1st MTP joint   Across MTP joint 24  Around proximal great toe 8.5  (Blank rows = not tested)  LOWER EXTREMITY LANDMARK LEFT eval  At groin 67  30 cm proximal to suprapatella   20 cm proximal to suprapatella 63.8  10 cm proximal to suprapatella 52.5  At midpatella / popliteal crease 42.2  30 cm proximal to floor at lateral plantar foot 35.5  20 cm proximal to floor at lateral plantar foot 16.0  10 cm proximal to floor at lateral plantar foot 14.1  Circumference of ankle/heel   5 cm proximal to 1st MTP joint   Across MTP joint 22.5  Around proximal great toe 7.9  (Blank rows = not tested)  FUNCTIONAL TESTS:   30 sec sit to stand  9 reps using arms on chair TUG: 16 sec, 18 sec with use of arms and cane Balance: DLS able to maintain 10 sec, half tandem left forward 7 seconds, right forward 10 sec  GAIT: Distance walked: 40 feet Assistive device utilized: Single point canehandle backwards and using on right side Level of assistance: Complete Independence Comments: lurching type gait, excessive lateral motion decreased heel strike, toe off,  TODAY'S TREATMENT:                                                                                                                                         DATE: 06/30/2023  PATIENT EDUCATION:  Education details: discussed POC with pt including bandaging to right LE before starting strengthening to try and get swelling down first as it is limiting her  mobility, Bringing children to help wrap. Person educated: Patient Education method: Explanation Education comprehension: verbalized understanding  HOME EXERCISE PROGRAM: 1/18/2024None given at eval  ASSESSMENT:  CLINICAL IMPRESSION: Patient is a 67 y.o. female who was seen today for physical therapy evaluation and treatment for Right LE lymphedema and bilateral LE neuropathy affecting gait and balance. There is significant Right LE swelling likely due to radiation for bone METS and metastasis to pelvic LN's. Pts ability to stand, walk and sit is limited by swelling and pain in the right LE. Significant gait deviation and lack of balance is noted. Her  30 second sit to stand test is poor for her age, and her timed up and go is also poor predicting increased fall risk. Pt. Is anxious to get the swelling out of her leg and we will work on this before we progress to strength and balance. She is willing to have a family member come and learn wrapping. She will benefit from skilled PT for CDT to the right LE including bandaging, MLD, exercise and skin care education, in addition to strength, flexibility and balance exercises to decrease her fall risk and improve her function.  OBJECTIVE IMPAIRMENTS: Abnormal gait, decreased activity tolerance, decreased balance, decreased endurance, decreased knowledge of condition, difficulty walking, decreased strength, increased edema, increased muscle spasms, and pain.   ACTIVITY LIMITATIONS: carrying, lifting, sitting, standing, squatting, sleeping, bed mobility, dressing, locomotion level, and normal household chores  PARTICIPATION LIMITATIONS: meal prep, laundry, driving, community activity, and normal household chores  PERSONAL FACTORS: 3+ comorbidities: Left lumpectomy s/p chemo and radiation, bone and LN metastasis, legally blind, DM, CHF ,COPD are also affecting patient's functional outcome.   REHAB POTENTIAL: Good  CLINICAL DECISION MAKING:  Evolving/moderate complexity  EVALUATION COMPLEXITY: Moderate  GOALS: Goals reviewed with patient? Yes  SHORT TERM GOALS: Target date: 09/26/2022  Pt/pts children will be independent in compression bandaging to the Right LE Baseline: Goal status: INITIAL  2.  Pt will be tolerant of compression bandaging and will have reduction in Right LE swelling without any deleterious effects IE: SOB Baseline:  Goal status: INITIAL  3. Pt will have reduction in swelling at 20 cm above suprapatella by 4 cm  Baseline approx 77 cm Goal Status: INITIAL  4. Pt will have decreased edema at 10 cm prox to floor by4 cm Baseline 12 cm Goal status: initial  LONG TERM GOALS: Target date: 10/23/2022  Pt will have edema reduction at 20 cm prox to suprapatella by 7 cm for  easier mobility Baseline:  Goal status: INITIAL  2.  Pt will have edema reduction at 10 cm prox to floor by 6 cm or more to improve mobility Baseline:  Goal status: INITIAL  3.  Pt will be able to perform 14 sit to stands in 30 seconds to decrease fall risk Baseline:  Goal status: INITIAL  4.  Pt will perform TUG in 12 seconds or less to decrease fall risk Baseline:  Goal status: INITIAL  5. Pt will be fit for compression garment to hold right LE swelling  Goal status:INITIAL  PLAN:  PT FREQUENCY: 3x/week  PT DURATION: 8 weeks  PLANNED INTERVENTIONS: Therapeutic exercises, Neuromuscular re-education, Balance training, Gait training, Patient/Family education, Self Care, Orthotic/Fit training, Manual lymph drainage, Compression bandaging, Vasopneumatic device, Manual therapy, and Re-evaluation  PLAN FOR NEXT SESSION: MLD to right LE, Instruct pts.children in bandaging starting with just a few wraps to be sure it has no effect on breathing.after wrapping has helped: start NM, strength, gait exs, assess MMT/ROM prn   Claris Pong, PT 08/29/2022, 4:59 PM

## 2022-08-28 NOTE — Progress Notes (Signed)
[place]  [phone]) [phone] [phone]) [phone]   Name: Kristin Ward [facility] Date: 08/28/2022 MRN: [id number]  DOB: 07-19-1956  Patient Care Team: [name], MD as PCP - General (Hematology) [name], MD as PCP - Cardiology (Cardiology) [name], [name], MD as PCP - Advanced Heart Failure (Cardiology) [name], MD as Consulting Physician (Hematology) [name], MD as Consulting Physician (General Surgery) [name], [facility] (Inactive) as Pharmacist (Pharmacist) Jacelyn Pi, MD as Referring Physician (Endocrinology)    I connected with Kristin Ward on 08/28/22 at 12:00 PM EST by phone and verified that I am speaking with the correct person using two identifiers.   I discussed the limitations, risks, security and privacy concerns of performing an evaluation and management service by telemedicine and the availability of in-person appointments. I also discussed with the patient that there may be a patient responsible charge related to this service. The patient expressed understanding and agreed to proceed.   Other persons participating in the visit and their role in the encounter: [place], RN   Patient's location: home  Provider's location: [facility] office   Chief Complaint: follow up on symptom management    INTERVAL HISTORY: Kristin Ward is a 67 y.o. 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 Patient is s/p left breast lumpectomy, adjuvant chemo and Radiation. Biopsy of right gluteus on 09/02/20 due to new pain showed metastatic carcinoma, consistent with breast primary. Palliative ask to see for symptom and pain management and goals of care   SOCIAL HISTORY:     reports that she quit smoking about 6 years ago. Her smoking use included cigarettes. She has a 5.75 pack-year smoking  history. She has never used smokeless tobacco. She reports that she does not drink alcohol and does not use drugs.  ADVANCE DIRECTIVES:  MOST and DNR filed  CODE STATUS: DNR  PAST MEDICAL HISTORY: Past Medical History:  Diagnosis Date   Abnormal x-ray of lungs with single pulmonary nodule 03/15/2016   per records from [place]    Anemia    Asthma    Cholelithiasis 05/19/2017   On CT   Coronary artery calcification seen on CAT scan 05/19/2017   Diabetes mellitus without complication ([place])    type 2   Diabetes mellitus, new onset ([place]) 01/20/2019   Dilated idiopathic cardiomyopathy ([place]) 12/2015   EF 15-20%. Diagnosed in [facility]   Headache    NONE RECENT   Hypertension    Lymph nodes enlarged 07/25/2020   Malignant neoplasm of lower-outer quadrant of left breast of female, estrogen receptor negative ([place]) 06/11/2017   left breast   Mixed hyperlipidemia 06/17/2018   partially blind    Personal history of chemotherapy 2018-2019   Personal history of radiation therapy 2019   Retinitis pigmentosa    Retroperitoneal lymphadenopathy 07/25/2020   Uveitis     ALLERGIES:  is allergic to morphine and related, latex, tylenol with codeine #3 [acetaminophen-codeine], and penicillins.  MEDICATIONS:  Current Outpatient Medications  Medication Sig Dispense Refill   Accu-Chek FastClix Lancets MISC TEST TWICE A DAY. PT USES AN ACCU-CHEK GUIDE ME METER (Patient not taking: Reported on 08/14/2022) 102 each 2   amLODipine (NORVASC) 5 MG tablet Take 5 mg by mouth daily as needed (high B/P).     Ascorbic Acid (VITAMIN C) 500 MG CAPS Take 500 mg by mouth daily.  aspirin 81 MG chewable tablet Chew 81 mg by mouth daily.     atorvastatin (LIPITOR) 40 MG tablet Take 1 tablet (40 mg total) by mouth daily. 90 tablet 3   brimonidine (ALPHAGAN) 0.2 % ophthalmic solution Place 1 drop into both eyes 2 (two) times daily.     carvedilol (COREG) 12.5 MG tablet TAKE 1 AND 1/2 TABLETS BY  MOUTH TWICE DAILY qm 90 tablet 10   Cholecalciferol (VITAMIN D) 50 MCG (2000 UT) CAPS Take 2,000 Units by mouth daily.     empagliflozin (JARDIANCE) 10 MG TABS tablet Take 1 tablet (10 mg total) by mouth daily before breakfast. 90 tablet 3   furosemide (LASIX) 20 MG tablet TAKE 1-2 TABLETS (40 MG TOTAL) BY MOUTH AS NEEDED FOR WEIGHT GAIN 3 TO 4 LBS or significant leg edema 60 tablet 1   gabapentin (NEURONTIN) 300 MG capsule TAKE 1 CAPSULE BY MOUTH TWICE A DAY 30 capsule 1   HUMALOG KWIKPEN 100 UNIT/ML KwikPen Inject 3-10 Units into the skin 3 (three) times daily before meals. Per sliding scale (Patient not taking: Reported on 08/14/2022)     HYDROcodone-acetaminophen (NORCO/VICODIN) 5-325 MG tablet Take 1 tablet by mouth every 6 (six) hours as needed for moderate pain. 30 tablet 0   ibuprofen (ADVIL) 800 MG tablet Take 1 tablet (800 mg total) by mouth every 8 (eight) hours as needed. 30 tablet 2   Lancets Misc. (ACCU-CHEK SOFTCLIX LANCET DEV) KIT 1 Units by Does not apply route 2 (two) times daily. Check FSBS BID - Include strips # 50 with 5 refills, Lancets #50 with 5 refills DX: Type 2 DM - ICD 10: E11.9 (Patient not taking: Reported on 08/14/2022) 1 kit 0   lidocaine-prilocaine (EMLA) cream Apply 1 application topically as needed. 30 g 0   linaclotide (LINZESS) 72 MCG capsule Take 72 mcg by mouth daily as needed (constipation).     magic mouthwash (nystatin, lidocaine, diphenhydrAMINE, alum & mag hydroxide) suspension Swish and swallow 5 mLs by mouth 4 (four) times daily as needed for mouth pain 140 mL 0   Multiple Vitamins-Minerals (WOMENS MULTI PO) Take 15 mLs by mouth daily.     ondansetron (ZOFRAN) 8 MG tablet Take 1 tablet (8 mg total) by mouth every 8 (eight) hours as needed for nausea or vomiting (begin on day 3 after chemo). 30 tablet 1   oxyCODONE (OXY IR/ROXICODONE) 5 MG immediate release tablet Take 1 tablet (5 mg total) by mouth every 8 (eight) hours as needed for severe pain. 90 tablet 0    pantoprazole (PROTONIX) 40 MG tablet TAKE 1 TABLET BY MOUTH EVERY DAY 90 tablet 1   prochlorperazine (COMPAZINE) 10 MG tablet Take 1 tablet (10 mg total) by mouth every 6 (six) hours as needed (Nausea or vomiting). 30 tablet 1   promethazine (PHENERGAN) 25 MG tablet Take 1 tablet (25 mg total) by mouth every 6 (six) hours as needed for nausea or vomiting. 30 tablet 0   sacubitril-valsartan (ENTRESTO) 97-103 MG Take 1 tablet by mouth 2 (two) times daily. 60 tablet 10   spironolactone (ALDACTONE) 25 MG tablet Take 1 tablet (25 mg total) by mouth daily. 30 tablet 10   vitamin B-12 (CYANOCOBALAMIN) 1000 MCG tablet Take 1,000 mcg by mouth daily.     zinc gluconate 50 MG tablet Take 50 mg by mouth daily.     Current Facility-Administered Medications  Medication Dose Route Frequency Provider Last Rate Last Admin   insulin glulisine (APIDRA) injection 6 Units  6 Units Subcutaneous Once Francis Gaines B, PA-C        VITAL SIGNS: There were no vitals taken for this visit. There were no vitals filed for this visit.  Estimated body mass index is 34.29 kg/m as calculated from the following:   Height as of 08/09/22: 5' 7.25" (1.708 m).   Weight as of 08/14/22: 220 lb 9.6 oz (100.1 kg).   PERFORMANCE STATUS (ECOG) : 1 - Symptomatic but completely ambulatory   Physical Exam General: NAD Cardiovascular: regular rate and rhythm Pulmonary: clear ant fields Abdomen: soft, nontender, + bowel sounds GU: no suprapubic tenderness Extremities: no edema, no joint deformities Skin: no rashes Neurological:   IMPRESSION:  I connected with Kristin Ward via phone for symptom management follow-up. She is trying to remain as active as possible. Denies nausea, vomiting, or diarrhea. No acute distress identified.   Neuropathic Pain Kristin Ward shares her pain is slowly improving with medication. We discussed her current regimen: Oxycodone 5 mg as needed and gabapentin 300 mg twice daily.  She does endorse some  improvement in her neuropathic pain however continues to be bothersome on a daily basis.  Education provided on recommendations to increase gabapentin to 600 mg at bedtime.  She verbalized understanding.  We will continue to closely monitor and adjust as needed.     2.  Constipation Patient endorses occasional constipation.  Does not have much relief with MiraLAX or senna daily.  We discussed previous use of Linzess which she did find helpful.  I advised patient we will restart Linzess for better bowel management.  We discussed Her current illness and what it means in the larger context of Her on-going co-morbidities. Natural disease trajectory and expectations were discussed.  I discussed the importance of continued conversation with family and their medical providers regarding overall plan of care and treatment options, ensuring decisions are within the context of the patients values and GOCs.  PLAN: Gabapentin 600 mg daily and 900 mg at bedtime. Oxycodone 5 mg every 8 hours as needed for pain. Linzess 72 mcg daily Will continue to closely monitor and adjust medications according to symptoms. I will plan to see patient back in 3-4 weeks in collaboration to other oncology appointments.    Patient expressed understanding and was in agreement with this plan. She also understands that She can call the clinic at any time with any questions, concerns, or complaints.   Any controlled substances utilized were prescribed in the context of palliative care. PDMP has been reviewed.   Time Total: 45 min   Visit consisted of counseling and education dealing with the complex and emotionally intense issues of symptom management and palliative care in the setting of serious and potentially life-threatening illness.Greater than 50%  of this time was spent counseling and coordinating care related to the above assessment and plan.  Alda Lea, AGPCNP-BC  Palliative Medicine Team/[place]

## 2022-08-29 ENCOUNTER — Ambulatory Visit: Payer: 59 | Attending: Nurse Practitioner

## 2022-08-29 ENCOUNTER — Other Ambulatory Visit: Payer: Self-pay

## 2022-08-29 ENCOUNTER — Encounter: Payer: Self-pay | Admitting: Hematology

## 2022-08-29 DIAGNOSIS — R262 Difficulty in walking, not elsewhere classified: Secondary | ICD-10-CM

## 2022-08-29 DIAGNOSIS — R293 Abnormal posture: Secondary | ICD-10-CM | POA: Diagnosis not present

## 2022-08-29 DIAGNOSIS — I890 Lymphedema, not elsewhere classified: Secondary | ICD-10-CM | POA: Diagnosis not present

## 2022-08-29 DIAGNOSIS — G620 Drug-induced polyneuropathy: Secondary | ICD-10-CM | POA: Diagnosis not present

## 2022-08-29 DIAGNOSIS — M6281 Muscle weakness (generalized): Secondary | ICD-10-CM | POA: Diagnosis not present

## 2022-08-29 DIAGNOSIS — T451X5A Adverse effect of antineoplastic and immunosuppressive drugs, initial encounter: Secondary | ICD-10-CM | POA: Diagnosis not present

## 2022-09-02 ENCOUNTER — Ambulatory Visit: Payer: 59

## 2022-09-02 ENCOUNTER — Ambulatory Visit
Admission: RE | Admit: 2022-09-02 | Discharge: 2022-09-02 | Disposition: A | Discharge status: Home / Self Care | Payer: 59 | Source: Ambulatory Visit | Attending: Radiation Oncology | Admitting: Radiation Oncology

## 2022-09-02 DIAGNOSIS — T451X5A Adverse effect of antineoplastic and immunosuppressive drugs, initial encounter: Secondary | ICD-10-CM | POA: Diagnosis not present

## 2022-09-02 DIAGNOSIS — I429 Cardiomyopathy, unspecified: Secondary | ICD-10-CM | POA: Diagnosis not present

## 2022-09-02 DIAGNOSIS — R293 Abnormal posture: Secondary | ICD-10-CM

## 2022-09-02 DIAGNOSIS — G629 Polyneuropathy, unspecified: Secondary | ICD-10-CM | POA: Diagnosis not present

## 2022-09-02 DIAGNOSIS — E1165 Type 2 diabetes mellitus with hyperglycemia: Secondary | ICD-10-CM | POA: Diagnosis not present

## 2022-09-02 DIAGNOSIS — M6281 Muscle weakness (generalized): Secondary | ICD-10-CM | POA: Diagnosis not present

## 2022-09-02 DIAGNOSIS — G620 Drug-induced polyneuropathy: Secondary | ICD-10-CM

## 2022-09-02 DIAGNOSIS — I890 Lymphedema, not elsewhere classified: Secondary | ICD-10-CM

## 2022-09-02 DIAGNOSIS — E7800 Pure hypercholesterolemia, unspecified: Secondary | ICD-10-CM | POA: Diagnosis not present

## 2022-09-02 DIAGNOSIS — R262 Difficulty in walking, not elsewhere classified: Secondary | ICD-10-CM | POA: Diagnosis not present

## 2022-09-02 NOTE — Patient Instructions (Signed)
Start with circles near neck, 5 times each side   Cancer Rehab [phone] Deep Effective Breath   Standing, sitting, or laying down place both hands on the belly. Take a deep breath IN, expanding the belly; then breath OUT, contracting the belly. Repeat __5__ times. Do __2-3__ sessions per day and before each self massage.  Inguinal Nodes to Axilla - Clear   On involved side, at armpit, make _5__ in-place circles. Then from hip proceed in sections to armpit with stationary circles or pumps _5_ times, this is your pathway. Do _1__ time per day.  Copyright  VHI. All rights reserved.  LEG: Knee to Hip - Clear   Pump up outer thigh of involved leg from knee to outer hip. Then do stationary circles from inner to outer thigh, then do outer thigh again. Next, interlace fingers behind knee IF ABLE and make in-place circles. Do _5_ times of each sequence.  Do _1__ time per day.  Copyright  VHI. All rights reserved.  LEG: Ankle to Hip Sweep   Hands on sides of ankle of involved leg, pump _5__ times up both sides of lower leg, then retrace steps up outer thigh to hip as before and back to pathway. Do _2-3_ times. Do __1_ time per day.  Copyright  VHI. All rights reserved.  FOOT: Dorsum of Foot and Toes Massage   One hand on top of foot make _5_ stationary circles or pumps, then either on top of toes or each individual toe do _5_ pumps. Then retrace all steps pumping back up both sides of lower leg, outer thigh, and then pathway. Finish with what you started with, _5_ circles at involved side arm pit. All _2-3_ times at each sequence. Do _1__ time per day.  Copyright  VHI. All rights reserved.

## 2022-09-02 NOTE — Therapy (Signed)
OUTPATIENT PHYSICAL THERAPY ONCOLOGY TREATMENT  Patient Name: Kristin Ward MRN: [id number] DOB:1956/08/07, 67 y.o., female Today's Date: 09/02/2022  END OF SESSION:  PT End of Session - 09/02/22 1118     Visit Number 2    Number of Visits 24    Date for PT Re-Evaluation 10/24/22    PT Start Time 1110    PT Stop Time 1206    PT Time Calculation (min) 56 min    Activity Tolerance Patient tolerated treatment well    Behavior During Therapy [facility] for tasks assessed/performed             Past Medical History:  Diagnosis Date   Abnormal x-ray of lungs with single pulmonary nodule 03/15/2016   per records from [place]    Anemia    Asthma    Cholelithiasis 05/19/2017   On CT   Coronary artery calcification seen on CAT scan 05/19/2017   Diabetes mellitus without complication ([place])    type 2   Diabetes mellitus, new onset ([place]) 01/20/2019   Dilated idiopathic cardiomyopathy ([place]) 12/2015   EF 15-20%. Diagnosed in [facility]   Headache    NONE RECENT   Hypertension    Lymph nodes enlarged 07/25/2020   Malignant neoplasm of lower-outer quadrant of left breast of female, estrogen receptor negative ([place]) 06/11/2017   left breast   Mixed hyperlipidemia 06/17/2018   partially blind    Personal history of chemotherapy 2018-2019   Personal history of radiation therapy 2019   Retinitis pigmentosa    Retroperitoneal lymphadenopathy 07/25/2020   Uveitis    Past Surgical History:  Procedure Laterality Date   brain cyst removed  2007   to help with headaches per patient , aspirated    BREAST BIOPSY Left 06/06/2017   x2   BREAST CYST ASPIRATION Left 06/06/2017   BREAST LUMPECTOMY Left 06/26/2017   BREAST LUMPECTOMY WITH RADIOACTIVE SEED AND SENTINEL LYMPH NODE BIOPSY Left 06/26/2017   Procedure: BREAST LUMPECTOMY WITH RADIOACTIVE SEED AND SENTINEL LYMPH NODE BIOPSY ERAS  PATHWAY;  Surgeon: [name], MD;  Location: [place];  Service: General;  Laterality:  Left;  pec block   FINE NEEDLE ASPIRATION Left 02/23/2019   Procedure: FINE NEEDLE ASPIRATION;  Surgeon: [name], MD;  Location: [place];  Service: General;  Laterality: Left;  Asiration of left breast seroma    IR IMAGING GUIDED PORT INSERTION  10/13/2020   NASAL TURBINATE REDUCTION     PORT-A-CATH REMOVAL Right 02/23/2019   Procedure: REMOVAL PORT-A-CATH;  Surgeon: [name], MD;  Location: [place];  Service: General;  Laterality: Right;   PORTACATH PLACEMENT N/A 06/26/2017   Procedure: INSERTION PORT-A-CATH;  Surgeon: [name], MD;  Location: [place];  Service: General;  Laterality: N/A;   RIGHT/LEFT HEART CATH AND CORONARY ANGIOGRAPHY N/A 07/31/2022   Procedure: RIGHT/LEFT HEART CATH AND CORONARY ANGIOGRAPHY;  Surgeon: Jolaine Artist, MD;  Location: [place] [facility];  Service: Cardiovascular;  Laterality: N/A;   TRANSTHORACIC ECHOCARDIOGRAM  12/2015   A) [facility] May 2017: Mild concentric LVH. Global hypokinesis. GR daily. EF 18% severe LA dilation. Mitral annular dilatation with papillary muscle dysfunction and moderate MR. Dilated IVC consistent with elevated RAP.   TRANSTHORACIC ECHOCARDIOGRAM  05/2017; 08/2017   a) Mild concentric LVH.  EF 45 to 50% with diffuse hypokinesis.  GR 1 DD.  Mild aortic root dilation.;; b)  Mild LVH EF 45 to 50%.  Diffuse HK.  No significant valve disease.  No significant change.   TRANSTHORACIC ECHOCARDIOGRAM  01/30/2022   Pre[place]:EF 35 to 40%.  Moderate dysfunction with global HK.  Abnormal strain.  Normal RV size and function.  Normal RVSP.  Aortic root estimated 42 mm.  Mild MR.  Mild/trivial AI.  Normal RVP.;  04/01/2022: Preliminary read 40%.  Formal read 45 to 50% (probably 40 to 45%).  Global HK.  GR 1 DD.  Normal RV size and RVP.  Mild MR.  Now measured at 38 mm.  Normal RAP.   Patient Active Problem List   Diagnosis Date Noted   Peripheral neuropathy due to chemotherapy ([place])  07/26/2022   Cancer related pain 07/26/2022   Swelling 04/16/2022   Constipation 02/21/2022   Hypertension associated with diabetes ([place]) 11/26/2021   Class 1 obesity due to excess calories with serious comorbidity and body mass index (BMI) of 33.0 to 33.9 in adult 11/26/2021   Pain due to onychomycosis of toenails of both feet 11/21/2021   Neuropathy associated with cancer ([place]) 11/21/2021   History of chemotherapy 09/14/2021   Uncontrolled type 2 diabetes mellitus with hyperglycemia ([place]) 03/08/2021   Neuropathy 01/12/2021   Situational depression 01/12/2021   Goals of care, counseling/discussion 09/15/2020   Malignant neoplasm metastatic to bone ([place]) 08/30/2020   Pain from bone metastases ([place]) 08/30/2020   Right hip pain 08/22/2020   Lymph nodes enlarged 07/25/2020   Retroperitoneal lymphadenopathy 07/25/2020   History of breast cancer 05/11/2020   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   Chronic right hip pain 04/05/2020   Vitamin D deficiency 10/07/2019   Elevated serum creatinine 10/07/2019   Mixed hyperlipidemia 06/17/2018   Influenza vaccination declined 06/17/2018   Obesity (BMI 30-39.9) 06/17/2018   Port-A-Cath in place 09/08/2017   Other fatigue 08/29/2017   Anemia 08/29/2017   High risk medication use 08/29/2017   Syncope 08/25/2017   Blind in both eyes 08/25/2017   Genetic testing 06/19/2017   Family history of colon cancer    Malignant neoplasm of lower-outer quadrant of left breast of female, estrogen receptor negative ([place]) 06/11/2017   Cholelithiasis 05/19/2017   Coronary artery calcification seen on CAT scan 05/19/2017   Low grade squamous intraepith lesion on cytologic smear cervix ([place]) 04/09/2017   HPV (human papilloma virus) infection 04/09/2017   COPD (chronic obstructive pulmonary disease) ([place])    Essential hypertension 03/20/2017   Screen for colon cancer 03/20/2017   Retinitis pigmentosa    Blind    Solitary pulmonary nodule on lung CT  03/16/2016   Abnormal myocardial perfusion study 03/15/2016   Chronic combined systolic and diastolic heart failure ([place]) 12/28/2015   Dilated cardiomyopathy ([place]) 12/27/2015    PCP: Dr Jill Alexanders  REFERRING PROVIDER: Cira Rue, NP  REFERRING DIAG: Chemo induced peripheral neuropathy  THERAPY DIAG:  Chemotherapy-induced neuropathy (HCC)  Abnormal posture  Muscle weakness (generalized)  Lymphedema, not elsewhere classified  Difficulty in walking, not elsewhere classified  ONSET DATE: 2019 with exacerbation  Rationale for Evaluation and Treatment: Rehabilitation  SUBJECTIVE:  SUBJECTIVE STATEMENT: I have boils near my groin that hurt some, but not as bad as my outer thigh does. I took an oxycodone after an oxycotin didn't help.   PERTINENT HISTORY:  Pt had a left lumpectomy with SLNB on 06/26/2017 for triple negative cancer with 0/3 LN's. She had adjuvant chemotherapy for 4 cycles followed by radiation. She had subsequent bone metastasis and had palliative radiation to the right iliac region in Jan-Feb 2022. She also had chemotherapy with Taxol in Feb 2022 stopped after the 4th cycle due to neuropathy and was switched to Xeloda. In November of 2022 she was noted to have disease progression with increased number and size of enlarged LN's in retroperitoneum, pelvic sidewall and upper thigh.. She progressed on [place] and most recently [place] ending 07/05/2022. She developed moderate neuropathy and it was stopped. She had a great response but now wants to try 1 month without treatment.. Negative doppler for DVT regarding swelling in Right LE  PAIN:  Are you having pain? Yes NPRS scale: 7/10 Right LE  Pain location: right leg Pain orientation: Right  PAIN TYPE: aching and numb Pain  description: constant  Aggravating factors: sitting gets more swollen, standing, walking Relieving factors: laying down with leg elevated  PRECAUTIONS: chronic systolic and diastolic heart failure EF 45-50%, right LE swelling, DM, hypertension, COPD,retinitis pigmentosa (limits vision legally blind) left UE lymphedema risk, bone metastasis  WEIGHT BEARING RESTRICTIONS: No  FALLS:  Has patient fallen in last 6 months? No  LIVING ENVIRONMENT: Lives with: lives with their family, lives with their son, and lives with their daughter Lives in: House/apartment Stairs: Yes; External: 4 steps; can reach both Has following equipment at home: Single point cane and shower chair, blind cane  OCCUPATION: retired Marine scientist  LEISURE: bowling, crafts,puzzles  HAND DOMINANCE: right   PRIOR LEVEL OF FUNCTION: Independent with household mobility with device  PATIENT GOALS: be more active, get swelling in right LE down,improve balance/gait   OBJECTIVE:  COGNITION: Overall cognitive status: Within functional limits for tasks assessed   PALPATION: Moderate pitting edema right lower leg, less so in upper leg  OBSERVATIONS / OTHER ASSESSMENTS: Significant right LE swelling foot to groin limiting mobility, ambulation, standing and sitting  SENSATION: Light touch: Deficits pt has sensation but it is decreased bilateral LE's   POSTURE: forward head, rounded shoulders  UPPER EXTREMITY AROM/PROM:  A/PROM RIGHT   eval   Shoulder extension   Shoulder flexion   Shoulder abduction   Shoulder internal rotation   Shoulder external rotation     (Blank rows = not tested)  A/PROM LEFT   eval  Shoulder extension   Shoulder flexion   Shoulder abduction   Shoulder internal rotation   Shoulder external rotation     (Blank rows = not tested)  CERVICAL AROM: All within functional limits:     UPPER EXTREMITY STRENGTH: NT   LOWER EXTREMITY AROM/PROM:  A/PROM Right eval  Hip flexion   Hip  extension   Hip abduction   Hip adduction   Hip internal rotation   Hip external rotation   Knee flexion   Knee extension   Ankle dorsiflexion   Ankle plantarflexion   Ankle inversion   Ankle eversion    (Blank rows = not tested)  A/PROM LEFT eval  Hip flexion   Hip extension   Hip abduction   Hip adduction   Hip internal rotation   Hip external rotation   Knee flexion   Knee extension  Ankle dorsiflexion   Ankle plantarflexion   Ankle inversion   Ankle eversion    (Blank rows = not tested)  LOWER EXTREMITY MMT:   LYMPHEDEMA ASSESSMENTS:   SURGERY TYPE/DATE: 06/26/2017 Left lumpectomy with SLNB for Triple Negative cancer  NUMBER OF LYMPH NODES REMOVED: 0/3  CHEMOTHERAPY: Yes, multiple for breast cancer and metastasis  RADIATION:YES for breast and iliac regions due to metastasis  HORMONE TREATMENT: NO  INFECTIONS: NO  LYMPHEDEMA ASSESSMENTS:   LANDMARK RIGHT  eval  10 cm proximal to olecranon process   Olecranon process   10 cm proximal to ulnar styloid process   Just proximal to ulnar styloid process   Across hand at thumb web space   At base of 2nd digit   (Blank rows = not tested)  LANDMARK LEFT  eval  10 cm proximal to olecranon process   Olecranon process   10 cm proximal to ulnar styloid process   Just proximal to ulnar styloid process   Across hand at thumb web space   At base of 2nd digit   (Blank rows = not tested)   LOWER EXTREMITY LANDMARK RIGHT eval  At groin 83  30 cm proximal to suprapatella   20 cm proximal to suprapatella 77  10 cm proximal to suprapatella 65.5  At midpatella / popliteal crease 57.5  30 cm proximal to floor at lateral plantar foot 43.5  20 cm proximal to floor at lateral plantar foot 32.8  10 cm proximal to floor at lateral plantar foot 27.3  Circumference of ankle/heel   5 cm proximal to 1st MTP joint   Across MTP joint 24  Around proximal great toe 8.5  (Blank rows = not tested)  LOWER EXTREMITY  LANDMARK LEFT eval  At groin 67  30 cm proximal to suprapatella   20 cm proximal to suprapatella 63.8  10 cm proximal to suprapatella 52.5  At midpatella / popliteal crease 42.2  30 cm proximal to floor at lateral plantar foot 35.5  20 cm proximal to floor at lateral plantar foot 16.0  10 cm proximal to floor at lateral plantar foot 14.1  Circumference of ankle/heel   5 cm proximal to 1st MTP joint   Across MTP joint 22.5  Around proximal great toe 7.9  (Blank rows = not tested)  FUNCTIONAL TESTS:   30 sec sit to stand  9 reps using arms on chair TUG: 16 sec, 18 sec with use of arms and cane Balance: DLS able to maintain 10 sec, half tandem left forward 7 seconds, right forward 10 sec  GAIT: Distance walked: 40 feet Assistive device utilized: Single point canehandle backwards and using on right side Level of assistance: Complete Independence Comments: lurching type gait, excessive lateral motion decreased heel strike, toe off,       TODAY'S TREATMENT:  DATE:  09/02/22: Manual Therapy MLD: Began instructing pt and daughter in basics of lymphatic systema nd principles of MLD while performing following: Short neck, 5 diaphragmatic breaths (cuing for technique here), Lt inguinal and Rt axillary nodes, anterior inter-inguinal and Lt inguino-axillary anastomosis, then worked on Rt upper leg on lateral thigh, medial to latearl and then lateral again and retraced all steps to anastomosis; began instructing pt in this having her return demo Compression Bandaging: Cocoa butter applied to entire Rt LE: TG soft size large to thigh, and then TG soft size med from foot to knee then bandages as follows: Elastomull to toes 1-4, artiflex x1 from foot to knee, and then 1-6 cm to foot ankle (combined Roman sandal and ASH technique with this one bandage to start light  pressure) and 1-10 cm from ankle to knee. Pt had some discomfort with donning TG soft on her thigh but once this was in place she reports it feeling much better on her leg. Showed daughter how to be mindful of keeping top band folded over so as to prevent TG soft from rolling down on her leg, especially at posterior thigh. Pt reports bandages feeling comfortable by end of session and was educated in remedial exercises to perform if she notices any increased tingling or pain that isn't from her CIPN. Then if any possible symptoms don't improve to remove them and bring them back.   08/29/2022  PATIENT EDUCATION:  Education details: Remedial exs with compression bandages on ; self MLD Person educated: Patient and daughter Education method: Explanation, Demonstration Education comprehension: verbalized understanding, returned demo, will benefit from further review  HOME EXERCISE PROGRAM: 09/02/22: Remedial exs and to doff bandages if she has any increased pain/tingling that isn't alleviated by exs; self MLD 1/18/2024None given at eval  ASSESSMENT:  CLINICAL IMPRESSION: Began CDT to Rt LE today, albeit gently with compression bandages to assess her tolerance to these. Pt and daughter were educated about being mindful of SOB that could occur with bandages and to assess her urinary output while bandaged. Also began instructing daughter and pt in self MLD to Rt LE only instructing up thigh sequence as this is all we had time for today with also starting leg bandage. Pt returned good demo after cuing for correct pressure and skin stretch, not slide.   OBJECTIVE IMPAIRMENTS: Abnormal gait, decreased activity tolerance, decreased balance, decreased endurance, decreased knowledge of condition, difficulty walking, decreased strength, increased edema, increased muscle spasms, and pain.   ACTIVITY LIMITATIONS: carrying, lifting, sitting, standing, squatting, sleeping, bed mobility, dressing, locomotion level,  and normal household chores  PARTICIPATION LIMITATIONS: meal prep, laundry, driving, community activity, and normal household chores  PERSONAL FACTORS: 3+ comorbidities: Left lumpectomy s/p chemo and radiation, bone and LN metastasis, legally blind, DM, CHF ,COPD are also affecting patient's functional outcome.   REHAB POTENTIAL: Good  CLINICAL DECISION MAKING: Evolving/moderate complexity  EVALUATION COMPLEXITY: Moderate  GOALS: Goals reviewed with patient? Yes  SHORT TERM GOALS: Target date: 09/26/2022  Pt/pts children will be independent in compression bandaging to the Right LE Baseline: Goal status: INITIAL  2.  Pt will be tolerant of compression bandaging and will have reduction in Right LE swelling without any deleterious effects IE: SOB Baseline:  Goal status: INITIAL  3. Pt will have reduction in swelling at 20 cm above suprapatella by 4 cm  Baseline approx 77 cm Goal Status: INITIAL  4. Pt will have decreased edema at 10 cm prox to floor by4 cm Baseline 12  cm Goal status: initial  LONG TERM GOALS: Target date: 10/23/2022  Pt will have edema reduction at 20 cm prox to suprapatella by 7 cm for easier mobility Baseline:  Goal status: INITIAL  2.  Pt will have edema reduction at 10 cm prox to floor by 6 cm or more to improve mobility Baseline:  Goal status: INITIAL  3.  Pt will be able to perform 14 sit to stands in 30 seconds to decrease fall risk Baseline:  Goal status: INITIAL  4.  Pt will perform TUG in 12 seconds or less to decrease fall risk Baseline:  Goal status: INITIAL  5. Pt will be fit for compression garment to hold right LE swelling  Goal status:INITIAL  PLAN:  PT FREQUENCY: 3x/week  PT DURATION: 8 weeks  PLANNED INTERVENTIONS: Therapeutic exercises, Neuromuscular re-education, Balance training, Gait training, Patient/Family education, Self Care, Orthotic/Fit training, Manual lymph drainage, Compression bandaging, Vasopneumatic device,  Manual therapy, and Re-evaluation  PLAN FOR NEXT SESSION: MLD to right LE, Instruct pts.children in bandaging starting with just a few wraps to be sure it has no effect on breathing.after wrapping has helped: start NM, strength, gait exs, assess MMT/ROM prn   Otelia Limes, PTA 09/02/2022, 1:32 PM  Start with circles near neck, 5 times each side   Cancer Rehab [phone] Deep Effective Breath   Standing, sitting, or laying down place both hands on the belly. Take a deep breath IN, expanding the belly; then breath OUT, contracting the belly. Repeat __5__ times. Do __2-3__ sessions per day and before each self massage.  Inguinal Nodes to Axilla - Clear   On involved side, at armpit, make _5__ in-place circles. Then from hip proceed in sections to armpit with stationary circles or pumps _5_ times, this is your pathway. Do _1__ time per day.  Copyright  VHI. All rights reserved.  LEG: Knee to Hip - Clear   Pump up outer thigh of involved leg from knee to outer hip. Then do stationary circles from inner to outer thigh, then do outer thigh again. Next, interlace fingers behind knee IF ABLE and make in-place circles. Do _5_ times of each sequence.  Do _1__ time per day.  Copyright  VHI. All rights reserved.  LEG: Ankle to Hip Sweep   Hands on sides of ankle of involved leg, pump _5__ times up both sides of lower leg, then retrace steps up outer thigh to hip as before and back to pathway. Do _2-3_ times. Do __1_ time per day.  Copyright  VHI. All rights reserved.  FOOT: Dorsum of Foot and Toes Massage   One hand on top of foot make _5_ stationary circles or pumps, then either on top of toes or each individual toe do _5_ pumps. Then retrace all steps pumping back up both sides of lower leg, outer thigh, and then pathway. Finish with what you started with, _5_ circles at involved side arm pit. All _2-3_ times at each sequence. Do _1__ time per day.

## 2022-09-02 NOTE — Progress Notes (Signed)
  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  The patient did note fatigue during radiation but has since improved. The patient did not note skin changes in the field of radiation during therapy. The patient has noticed improvement in pain in the area(s) treated with radiation. The patient is not taking dexamethasone. The patient does not have symptoms of  weakness or loss of control of the extremities. The patient does not have symptoms of headache. The patient does not have symptoms of seizure or uncontrolled movement. The patient does not have symptoms of changes in vision. The patient does not have changes in speech. The patient does not have confusion.   The patient is scheduled for ongoing care with Dr. Burr Medico in medical oncology. The patient was encouraged to call if she develops concerns or questions regarding radiation.  This concludes the interview.   Leandra Kern, LPN

## 2022-09-04 ENCOUNTER — Ambulatory Visit: Payer: 59

## 2022-09-04 DIAGNOSIS — I890 Lymphedema, not elsewhere classified: Secondary | ICD-10-CM | POA: Diagnosis not present

## 2022-09-04 DIAGNOSIS — G620 Drug-induced polyneuropathy: Secondary | ICD-10-CM

## 2022-09-04 DIAGNOSIS — R293 Abnormal posture: Secondary | ICD-10-CM

## 2022-09-04 DIAGNOSIS — R262 Difficulty in walking, not elsewhere classified: Secondary | ICD-10-CM | POA: Diagnosis not present

## 2022-09-04 DIAGNOSIS — M6281 Muscle weakness (generalized): Secondary | ICD-10-CM | POA: Diagnosis not present

## 2022-09-04 DIAGNOSIS — T451X5A Adverse effect of antineoplastic and immunosuppressive drugs, initial encounter: Secondary | ICD-10-CM | POA: Diagnosis not present

## 2022-09-04 NOTE — Therapy (Signed)
OUTPATIENT PHYSICAL THERAPY ONCOLOGY TREATMENT  Patient Name: Kristin Ward MRN: [id number] DOB:April 14, 1956, 67 y.o., female Today's Date: 09/04/2022  END OF SESSION:  PT End of Session - 09/04/22 0909     Visit Number 3    Number of Visits 24    Date for PT Re-Evaluation 10/24/22    PT Start Time 0902    PT Stop Time 1001    PT Time Calculation (min) 59 min    Activity Tolerance Patient tolerated treatment well    Behavior During Therapy [facility] for tasks assessed/performed             Past Medical History:  Diagnosis Date   Abnormal x-ray of lungs with single pulmonary nodule 03/15/2016   per records from [place]    Anemia    Asthma    Cholelithiasis 05/19/2017   On CT   Coronary artery calcification seen on CAT scan 05/19/2017   Diabetes mellitus without complication ([place])    type 2   Diabetes mellitus, new onset ([place]) 01/20/2019   Dilated idiopathic cardiomyopathy ([place]) 12/2015   EF 15-20%. Diagnosed in [facility]   Headache    NONE RECENT   Hypertension    Lymph nodes enlarged 07/25/2020   Malignant neoplasm of lower-outer quadrant of left breast of female, estrogen receptor negative ([place]) 06/11/2017   left breast   Mixed hyperlipidemia 06/17/2018   partially blind    Personal history of chemotherapy 2018-2019   Personal history of radiation therapy 2019   Retinitis pigmentosa    Retroperitoneal lymphadenopathy 07/25/2020   Uveitis    Past Surgical History:  Procedure Laterality Date   brain cyst removed  2007   to help with headaches per patient , aspirated    BREAST BIOPSY Left 06/06/2017   x2   BREAST CYST ASPIRATION Left 06/06/2017   BREAST LUMPECTOMY Left 06/26/2017   BREAST LUMPECTOMY WITH RADIOACTIVE SEED AND SENTINEL LYMPH NODE BIOPSY Left 06/26/2017   Procedure: BREAST LUMPECTOMY WITH RADIOACTIVE SEED AND SENTINEL LYMPH NODE BIOPSY ERAS  PATHWAY;  Surgeon: [name], MD;  Location: [place];  Service: General;  Laterality:  Left;  pec block   FINE NEEDLE ASPIRATION Left 02/23/2019   Procedure: FINE NEEDLE ASPIRATION;  Surgeon: [name], MD;  Location: [place];  Service: General;  Laterality: Left;  Asiration of left breast seroma    IR IMAGING GUIDED PORT INSERTION  10/13/2020   NASAL TURBINATE REDUCTION     PORT-A-CATH REMOVAL Right 02/23/2019   Procedure: REMOVAL PORT-A-CATH;  Surgeon: [name], MD;  Location: [place];  Service: General;  Laterality: Right;   PORTACATH PLACEMENT N/A 06/26/2017   Procedure: INSERTION PORT-A-CATH;  Surgeon: [name], MD;  Location: [place];  Service: General;  Laterality: N/A;   RIGHT/LEFT HEART CATH AND CORONARY ANGIOGRAPHY N/A 07/31/2022   Procedure: RIGHT/LEFT HEART CATH AND CORONARY ANGIOGRAPHY;  Surgeon: Jolaine Artist, MD;  Location: [place] [facility];  Service: Cardiovascular;  Laterality: N/A;   TRANSTHORACIC ECHOCARDIOGRAM  12/2015   A) [facility] May 2017: Mild concentric LVH. Global hypokinesis. GR daily. EF 18% severe LA dilation. Mitral annular dilatation with papillary muscle dysfunction and moderate MR. Dilated IVC consistent with elevated RAP.   TRANSTHORACIC ECHOCARDIOGRAM  05/2017; 08/2017   a) Mild concentric LVH.  EF 45 to 50% with diffuse hypokinesis.  GR 1 DD.  Mild aortic root dilation.;; b)  Mild LVH EF 45 to 50%.  Diffuse HK.  No significant valve disease.  No significant change.   TRANSTHORACIC ECHOCARDIOGRAM  01/30/2022   Pre[place]:EF 35 to 40%.  Moderate dysfunction with global HK.  Abnormal strain.  Normal RV size and function.  Normal RVSP.  Aortic root estimated 42 mm.  Mild MR.  Mild/trivial AI.  Normal RVP.;  04/01/2022: Preliminary read 40%.  Formal read 45 to 50% (probably 40 to 45%).  Global HK.  GR 1 DD.  Normal RV size and RVP.  Mild MR.  Now measured at 38 mm.  Normal RAP.   Patient Active Problem List   Diagnosis Date Noted   Peripheral neuropathy due to chemotherapy ([place])  07/26/2022   Cancer related pain 07/26/2022   Swelling 04/16/2022   Constipation 02/21/2022   Hypertension associated with diabetes ([place]) 11/26/2021   Class 1 obesity due to excess calories with serious comorbidity and body mass index (BMI) of 33.0 to 33.9 in adult 11/26/2021   Pain due to onychomycosis of toenails of both feet 11/21/2021   Neuropathy associated with cancer ([place]) 11/21/2021   History of chemotherapy 09/14/2021   Uncontrolled type 2 diabetes mellitus with hyperglycemia ([place]) 03/08/2021   Neuropathy 01/12/2021   Situational depression 01/12/2021   Goals of care, counseling/discussion 09/15/2020   Malignant neoplasm metastatic to bone ([place]) 08/30/2020   Pain from bone metastases ([place]) 08/30/2020   Right hip pain 08/22/2020   Lymph nodes enlarged 07/25/2020   Retroperitoneal lymphadenopathy 07/25/2020   History of breast cancer 05/11/2020   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   Chronic right hip pain 04/05/2020   Vitamin D deficiency 10/07/2019   Elevated serum creatinine 10/07/2019   Mixed hyperlipidemia 06/17/2018   Influenza vaccination declined 06/17/2018   Obesity (BMI 30-39.9) 06/17/2018   Port-A-Cath in place 09/08/2017   Other fatigue 08/29/2017   Anemia 08/29/2017   High risk medication use 08/29/2017   Syncope 08/25/2017   Blind in both eyes 08/25/2017   Genetic testing 06/19/2017   Family history of colon cancer    Malignant neoplasm of lower-outer quadrant of left breast of female, estrogen receptor negative ([place]) 06/11/2017   Cholelithiasis 05/19/2017   Coronary artery calcification seen on CAT scan 05/19/2017   Low grade squamous intraepith lesion on cytologic smear cervix ([place]) 04/09/2017   HPV (human papilloma virus) infection 04/09/2017   COPD (chronic obstructive pulmonary disease) ([place])    Essential hypertension 03/20/2017   Screen for colon cancer 03/20/2017   Retinitis pigmentosa    Blind    Solitary pulmonary nodule on lung CT  03/16/2016   Abnormal myocardial perfusion study 03/15/2016   Chronic combined systolic and diastolic heart failure ([place]) 12/28/2015   Dilated cardiomyopathy ([place]) 12/27/2015    PCP: Dr Jill Alexanders  REFERRING PROVIDER: Cira Rue, NP  REFERRING DIAG: Chemo induced peripheral neuropathy  THERAPY DIAG:  Chemotherapy-induced neuropathy (HCC)  Abnormal posture  Muscle weakness (generalized)  Lymphedema, not elsewhere classified  Difficulty in walking, not elsewhere classified  ONSET DATE: 2019 with exacerbation  Rationale for Evaluation and Treatment: Rehabilitation  SUBJECTIVE:  SUBJECTIVE STATEMENT: I was able to wear the bandages all day without a problem but when I went to bed that night I had to have it off. My daughter couldn't get it off fast enough. But my foot and ankle looked so good once they were removed. And I didn't have any problems with SOB and I did notice an increase in my urinary output as well.   PERTINENT HISTORY:  Pt had a left lumpectomy with SLNB on 06/26/2017 for triple negative cancer with 0/3 LN's. She had adjuvant chemotherapy for 4 cycles followed by radiation. She had subsequent bone metastasis and had palliative radiation to the right iliac region in Jan-Feb 2022. She also had chemotherapy with Taxol in Feb 2022 stopped after the 4th cycle due to neuropathy and was switched to Xeloda. In November of 2022 she was noted to have disease progression with increased number and size of enlarged LN's in retroperitoneum, pelvic sidewall and upper thigh.. She progressed on [place] and most recently [place] ending 07/05/2022. She developed moderate neuropathy and it was stopped. She had a great response but now wants to try 1 month without treatment.. Negative doppler  for DVT regarding swelling in Right LE  PAIN:  Are you having pain? Yes NPRS scale: 7/10 Right LE  Pain location: right leg Pain orientation: Right  PAIN TYPE: aching and numb Pain description: constant  Aggravating factors: sitting gets more swollen, standing, walking Relieving factors: laying down with leg elevated  PRECAUTIONS: chronic systolic and diastolic heart failure EF 45-50%, right LE swelling, DM, hypertension, COPD,retinitis pigmentosa (limits vision legally blind) left UE lymphedema risk, bone metastasis  WEIGHT BEARING RESTRICTIONS: No  FALLS:  Has patient fallen in last 6 months? No  LIVING ENVIRONMENT: Lives with: lives with their family, lives with their son, and lives with their daughter Lives in: House/apartment Stairs: Yes; External: 4 steps; can reach both Has following equipment at home: Single point cane and shower chair, blind cane  OCCUPATION: retired Marine scientist  LEISURE: bowling, crafts,puzzles  HAND DOMINANCE: right   PRIOR LEVEL OF FUNCTION: Independent with household mobility with device  PATIENT GOALS: be more active, get swelling in right LE down,improve balance/gait   OBJECTIVE:  COGNITION: Overall cognitive status: Within functional limits for tasks assessed   PALPATION: Moderate pitting edema right lower leg, less so in upper leg  OBSERVATIONS / OTHER ASSESSMENTS: Significant right LE swelling foot to groin limiting mobility, ambulation, standing and sitting  SENSATION: Light touch: Deficits pt has sensation but it is decreased bilateral LE's   POSTURE: forward head, rounded shoulders  UPPER EXTREMITY AROM/PROM:  A/PROM RIGHT   eval   Shoulder extension   Shoulder flexion   Shoulder abduction   Shoulder internal rotation   Shoulder external rotation     (Blank rows = not tested)  A/PROM LEFT   eval  Shoulder extension   Shoulder flexion   Shoulder abduction   Shoulder internal rotation   Shoulder external rotation      (Blank rows = not tested)  CERVICAL AROM: All within functional limits:     UPPER EXTREMITY STRENGTH: NT   LOWER EXTREMITY AROM/PROM:  A/PROM Right eval  Hip flexion   Hip extension   Hip abduction   Hip adduction   Hip internal rotation   Hip external rotation   Knee flexion   Knee extension   Ankle dorsiflexion   Ankle plantarflexion   Ankle inversion   Ankle eversion    (Blank rows =  not tested)  A/PROM LEFT eval  Hip flexion   Hip extension   Hip abduction   Hip adduction   Hip internal rotation   Hip external rotation   Knee flexion   Knee extension   Ankle dorsiflexion   Ankle plantarflexion   Ankle inversion   Ankle eversion    (Blank rows = not tested)  LOWER EXTREMITY MMT:   LYMPHEDEMA ASSESSMENTS:   SURGERY TYPE/DATE: 06/26/2017 Left lumpectomy with SLNB for Triple Negative cancer  NUMBER OF LYMPH NODES REMOVED: 0/3  CHEMOTHERAPY: Yes, multiple for breast cancer and metastasis  RADIATION:YES for breast and iliac regions due to metastasis  HORMONE TREATMENT: NO  INFECTIONS: NO  LYMPHEDEMA ASSESSMENTS:   LANDMARK RIGHT  eval  10 cm proximal to olecranon process   Olecranon process   10 cm proximal to ulnar styloid process   Just proximal to ulnar styloid process   Across hand at thumb web space   At base of 2nd digit   (Blank rows = not tested)  LANDMARK LEFT  eval  10 cm proximal to olecranon process   Olecranon process   10 cm proximal to ulnar styloid process   Just proximal to ulnar styloid process   Across hand at thumb web space   At base of 2nd digit   (Blank rows = not tested)   LOWER EXTREMITY LANDMARK RIGHT eval  At groin 83  30 cm proximal to suprapatella   20 cm proximal to suprapatella 77  10 cm proximal to suprapatella 65.5  At midpatella / popliteal crease 57.5  30 cm proximal to floor at lateral plantar foot 43.5  20 cm proximal to floor at lateral plantar foot 32.8  10 cm proximal to floor at  lateral plantar foot 27.3  Circumference of ankle/heel   5 cm proximal to 1st MTP joint   Across MTP joint 24  Around proximal great toe 8.5  (Blank rows = not tested)  LOWER EXTREMITY LANDMARK LEFT eval  At groin 67  30 cm proximal to suprapatella   20 cm proximal to suprapatella 63.8  10 cm proximal to suprapatella 52.5  At midpatella / popliteal crease 42.2  30 cm proximal to floor at lateral plantar foot 35.5  20 cm proximal to floor at lateral plantar foot 16.0  10 cm proximal to floor at lateral plantar foot 14.1  Circumference of ankle/heel   5 cm proximal to 1st MTP joint   Across MTP joint 22.5  Around proximal great toe 7.9  (Blank rows = not tested)  FUNCTIONAL TESTS:   30 sec sit to stand  9 reps using arms on chair TUG: 16 sec, 18 sec with use of arms and cane Balance: DLS able to maintain 10 sec, half tandem left forward 7 seconds, right forward 10 sec  GAIT: Distance walked: 40 feet Assistive device utilized: Single point canehandle backwards and using on right side Level of assistance: Complete Independence Comments: lurching type gait, excessive lateral motion decreased heel strike, toe off,       TODAY'S TREATMENT:  DATE:  09/04/22: Manual Therapy MLD: Short neck, 5 diaphragmatic breaths (pt with improved technique today), Rt axillary nodes, Rt inguino-axillary anastomosis, then worked on Rt upper leg on lateral thigh, medial to lateral and then lateral again, knee, lower leg on anterior and posterior surfaces then ended at ankle/foot toes and then retraced all steps to back to anastomosis and axillary LN's; continued instructing pt and having her return demo with hand over hand technique After MLD applied cocoa butter to Rt LE and 2 TG soft stockinettes from last session: large to thigh and medium to lower leg but no  compression bandages today as pt did not tolerate bandages from last session  09/02/22: Manual Therapy MLD: Began instructing pt and daughter in basics of lymphatic systema nd principles of MLD while performing following: Short neck, 5 diaphragmatic breaths (cuing for technique here), Lt inguinal and Rt axillary nodes, anterior inter-inguinal and Lt inguino-axillary anastomosis, then worked on Rt upper leg on lateral thigh, medial to latearl and then lateral again and retraced all steps to anastomosis; began instructing pt in this having her return demo Compression Bandaging: Cocoa butter applied to entire Rt LE: TG soft size large to thigh, and then TG soft size med from foot to knee then bandages as follows: Elastomull to toes 1-4, artiflex x1 from foot to knee, and then 1-6 cm to foot ankle (combined Roman sandal and ASH technique with this one bandage to start light pressure) and 1-10 cm from ankle to knee. Pt had some discomfort with donning TG soft on her thigh but once this was in place she reports it feeling much better on her leg. Showed daughter how to be mindful of keeping top band folded over so as to prevent TG soft from rolling down on her leg, especially at posterior thigh. Pt reports bandages feeling comfortable by end of session and was educated in remedial exercises to perform if she notices any increased tingling or pain that isn't from her CIPN. Then if any possible symptoms don't improve to remove them and bring them back.   08/29/2022  PATIENT EDUCATION:  Education details: Remedial exs with compression bandages on ; self MLD Person educated: Patient and daughter Education method: Explanation, Demonstration Education comprehension: verbalized understanding, returned demo, will benefit from further review  HOME EXERCISE PROGRAM: 09/02/22: Remedial exs and to doff bandages if she has any increased pain/tingling that isn't alleviated by exs; self MLD 1/18/2024None given at  eval  ASSESSMENT:  CLINICAL IMPRESSION: Pt did not tolerate bandages well so discontinued these today. She reports no problem with them during the day but when she tried to go to sleep that night they started to feel very tight and uncomfortable (despite therapist applying these loosely. She does report she noticed good reductions once they were removed so there seemed to be no other complications from CDT. Pt is interested in pursuing a Flexitouch compression pump so will speak to Cheral Almas, evaluating PT, to see if she thinks this appropriate for pt with her medical history. If so will fax pts demographics to [employer] for insurance verification per pts request. Also educated pt about velcro compression garments and she is interested in pursuing these because she could don and doff them as needed depending on her leg pain on any given day due to mets. Today continued with MLD but was able to perform on entire Rt LE and increased skin stretch was noted as fibrosis lessened in thigh some by end of session.  OBJECTIVE IMPAIRMENTS: Abnormal gait, decreased activity tolerance, decreased balance, decreased endurance, decreased knowledge of condition, difficulty walking, decreased strength, increased edema, increased muscle spasms, and pain.   ACTIVITY LIMITATIONS: carrying, lifting, sitting, standing, squatting, sleeping, bed mobility, dressing, locomotion level, and normal household chores  PARTICIPATION LIMITATIONS: meal prep, laundry, driving, community activity, and normal household chores  PERSONAL FACTORS: 3+ comorbidities: Left lumpectomy s/p chemo and radiation, bone and LN metastasis, legally blind, DM, CHF ,COPD are also affecting patient's functional outcome.   REHAB POTENTIAL: Good  CLINICAL DECISION MAKING: Evolving/moderate complexity  EVALUATION COMPLEXITY: Moderate  GOALS: Goals reviewed with patient? Yes  SHORT TERM GOALS: Target date: 09/26/2022  Pt/pts children will  be independent in compression bandaging to the Right LE Baseline: Goal status: INITIAL  2.  Pt will be tolerant of compression bandaging and will have reduction in Right LE swelling without any deleterious effects IE: SOB Baseline:  Goal status: INITIAL  3. Pt will have reduction in swelling at 20 cm above suprapatella by 4 cm  Baseline approx 77 cm Goal Status: INITIAL  4. Pt will have decreased edema at 10 cm prox to floor by4 cm Baseline 12 cm Goal status: initial  LONG TERM GOALS: Target date: 10/23/2022  Pt will have edema reduction at 20 cm prox to suprapatella by 7 cm for easier mobility Baseline:  Goal status: INITIAL  2.  Pt will have edema reduction at 10 cm prox to floor by 6 cm or more to improve mobility Baseline:  Goal status: INITIAL  3.  Pt will be able to perform 14 sit to stands in 30 seconds to decrease fall risk Baseline:  Goal status: INITIAL  4.  Pt will perform TUG in 12 seconds or less to decrease fall risk Baseline:  Goal status: INITIAL  5. Pt will be fit for compression garment to hold right LE swelling  Goal status:INITIAL  PLAN:  PT FREQUENCY: 3x/week  PT DURATION: 8 weeks  PLANNED INTERVENTIONS: Therapeutic exercises, Neuromuscular re-education, Balance training, Gait training, Patient/Family education, Self Care, Orthotic/Fit training, Manual lymph drainage, Compression bandaging, Vasopneumatic device, Manual therapy, and Re-evaluation  PLAN FOR NEXT SESSION: MLD to right LE, instruct and issue initial HEP: standing bil LE hip 3 way raises then later start NM, strength, gait exs, assess MMT/ROM prn   Otelia Limes, PTA 09/04/2022, 1:04 PM  Start with circles near neck, 5 times each side   Cancer Rehab [phone] Deep Effective Breath   Standing, sitting, or laying down place both hands on the belly. Take a deep breath IN, expanding the belly; then breath OUT, contracting the belly. Repeat __5__ times. Do __2-3__ sessions  per day and before each self massage.  Inguinal Nodes to Axilla - Clear   On involved side, at armpit, make _5__ in-place circles. Then from hip proceed in sections to armpit with stationary circles or pumps _5_ times, this is your pathway. Do _1__ time per day.  Copyright  VHI. All rights reserved.  LEG: Knee to Hip - Clear   Pump up outer thigh of involved leg from knee to outer hip. Then do stationary circles from inner to outer thigh, then do outer thigh again. Next, interlace fingers behind knee IF ABLE and make in-place circles. Do _5_ times of each sequence.  Do _1__ time per day.  Copyright  VHI. All rights reserved.  LEG: Ankle to Hip Sweep   Hands on sides of ankle of involved leg, pump _5__ times up both sides of  lower leg, then retrace steps up outer thigh to hip as before and back to pathway. Do _2-3_ times. Do __1_ time per day.  Copyright  VHI. All rights reserved.  FOOT: Dorsum of Foot and Toes Massage   One hand on top of foot make _5_ stationary circles or pumps, then either on top of toes or each individual toe do _5_ pumps. Then retrace all steps pumping back up both sides of lower leg, outer thigh, and then pathway. Finish with what you started with, _5_ circles at involved side arm pit. All _2-3_ times at each sequence. Do _1__ time per day.

## 2022-09-06 ENCOUNTER — Encounter: Payer: Self-pay | Admitting: Rehabilitation

## 2022-09-06 ENCOUNTER — Other Ambulatory Visit: Payer: Self-pay | Admitting: Hematology

## 2022-09-06 ENCOUNTER — Ambulatory Visit: Payer: 59 | Admitting: Rehabilitation

## 2022-09-06 ENCOUNTER — Telehealth: Payer: Self-pay

## 2022-09-06 DIAGNOSIS — T451X5A Adverse effect of antineoplastic and immunosuppressive drugs, initial encounter: Secondary | ICD-10-CM

## 2022-09-06 DIAGNOSIS — M6281 Muscle weakness (generalized): Secondary | ICD-10-CM | POA: Diagnosis not present

## 2022-09-06 DIAGNOSIS — I890 Lymphedema, not elsewhere classified: Secondary | ICD-10-CM | POA: Diagnosis not present

## 2022-09-06 DIAGNOSIS — R262 Difficulty in walking, not elsewhere classified: Secondary | ICD-10-CM | POA: Diagnosis not present

## 2022-09-06 DIAGNOSIS — G620 Drug-induced polyneuropathy: Secondary | ICD-10-CM | POA: Diagnosis not present

## 2022-09-06 DIAGNOSIS — R293 Abnormal posture: Secondary | ICD-10-CM | POA: Diagnosis not present

## 2022-09-06 MED ORDER — GABAPENTIN 300 MG PO CAPS: ORAL_CAPSULE | ORAL | 0 refills | Status: DC

## 2022-09-06 NOTE — Therapy (Signed)
OUTPATIENT PHYSICAL THERAPY ONCOLOGY TREATMENT  Patient Name: Kristin Ward MRN: [id number] DOB:March 18, 1956, 67 y.o., female Today's Date: 09/06/2022  END OF SESSION:  PT End of Session - 09/06/22 0839     Visit Number 4    Number of Visits 24    Date for PT Re-Evaluation 10/24/22    PT Start Time 0815    PT Stop Time 0840    PT Time Calculation (min) 25 min    Activity Tolerance Patient limited by pain    Behavior During Therapy [facility] for tasks assessed/performed              Past Medical History:  Diagnosis Date   Abnormal x-ray of lungs with single pulmonary nodule 03/15/2016   per records from [place]    Anemia    Asthma    Cholelithiasis 05/19/2017   On CT   Coronary artery calcification seen on CAT scan 05/19/2017   Diabetes mellitus without complication ([place])    type 2   Diabetes mellitus, new onset ([place]) 01/20/2019   Dilated idiopathic cardiomyopathy ([place]) 12/2015   EF 15-20%. Diagnosed in [facility]   Headache    NONE RECENT   Hypertension    Lymph nodes enlarged 07/25/2020   Malignant neoplasm of lower-outer quadrant of left breast of female, estrogen receptor negative ([place]) 06/11/2017   left breast   Mixed hyperlipidemia 06/17/2018   partially blind    Personal history of chemotherapy 2018-2019   Personal history of radiation therapy 2019   Retinitis pigmentosa    Retroperitoneal lymphadenopathy 07/25/2020   Uveitis    Past Surgical History:  Procedure Laterality Date   brain cyst removed  2007   to help with headaches per patient , aspirated    BREAST BIOPSY Left 06/06/2017   x2   BREAST CYST ASPIRATION Left 06/06/2017   BREAST LUMPECTOMY Left 06/26/2017   BREAST LUMPECTOMY WITH RADIOACTIVE SEED AND SENTINEL LYMPH NODE BIOPSY Left 06/26/2017   Procedure: BREAST LUMPECTOMY WITH RADIOACTIVE SEED AND SENTINEL LYMPH NODE BIOPSY ERAS  PATHWAY;  Surgeon: [name], MD;  Location: [place];  Service: General;  Laterality: Left;   pec block   FINE NEEDLE ASPIRATION Left 02/23/2019   Procedure: FINE NEEDLE ASPIRATION;  Surgeon: [name], MD;  Location: [place];  Service: General;  Laterality: Left;  Asiration of left breast seroma    IR IMAGING GUIDED PORT INSERTION  10/13/2020   NASAL TURBINATE REDUCTION     PORT-A-CATH REMOVAL Right 02/23/2019   Procedure: REMOVAL PORT-A-CATH;  Surgeon: [name], MD;  Location: [place];  Service: General;  Laterality: Right;   PORTACATH PLACEMENT N/A 06/26/2017   Procedure: INSERTION PORT-A-CATH;  Surgeon: [name], MD;  Location: [place];  Service: General;  Laterality: N/A;   RIGHT/LEFT HEART CATH AND CORONARY ANGIOGRAPHY N/A 07/31/2022   Procedure: RIGHT/LEFT HEART CATH AND CORONARY ANGIOGRAPHY;  Surgeon: Jolaine Artist, MD;  Location: [facility];  Service: Cardiovascular;  Laterality: N/A;   TRANSTHORACIC ECHOCARDIOGRAM  12/2015   A) [facility] May 2017: Mild concentric LVH. Global hypokinesis. GR daily. EF 18% severe LA dilation. Mitral annular dilatation with papillary muscle dysfunction and moderate MR. Dilated IVC consistent with elevated RAP.   TRANSTHORACIC ECHOCARDIOGRAM  05/2017; 08/2017   a) Mild concentric LVH.  EF 45 to 50% with diffuse hypokinesis.  GR 1 DD.  Mild aortic root dilation.;; b)  Mild LVH EF 45 to 50%.  Diffuse HK.  No significant valve  disease.  No significant change.   TRANSTHORACIC ECHOCARDIOGRAM  01/30/2022   [place]:EF 35 to 40%.  Moderate dysfunction with global HK.  Abnormal strain.  Normal RV size and function.  Normal RVSP.  Aortic root estimated 42 mm.  Mild MR.  Mild/trivial AI.  Normal RVP.;  04/01/2022: Preliminary read 40%.  Formal read 45 to 50% (probably 40 to 45%).  Global HK.  GR 1 DD.  Normal RV size and RVP.  Mild MR.  Now measured at 38 mm.  Normal RAP.   Patient Active Problem List   Diagnosis Date Noted   Peripheral neuropathy due to chemotherapy ([place]) 07/26/2022    Cancer related pain 07/26/2022   Swelling 04/16/2022   Constipation 02/21/2022   Hypertension associated with diabetes ([place]) 11/26/2021   Class 1 obesity due to excess calories with serious comorbidity and body mass index (BMI) of 33.0 to 33.9 in adult 11/26/2021   Pain due to onychomycosis of toenails of both feet 11/21/2021   Neuropathy associated with cancer ([place]) 11/21/2021   History of chemotherapy 09/14/2021   Uncontrolled type 2 diabetes mellitus with hyperglycemia ([place]) 03/08/2021   Neuropathy 01/12/2021   Situational depression 01/12/2021   Goals of care, counseling/discussion 09/15/2020   Malignant neoplasm metastatic to bone ([place]) 08/30/2020   Pain from bone metastases ([place]) 08/30/2020   Right hip pain 08/22/2020   Lymph nodes enlarged 07/25/2020   Retroperitoneal lymphadenopathy 07/25/2020   History of breast cancer 05/11/2020   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   Chronic right hip pain 04/05/2020   Vitamin D deficiency 10/07/2019   Elevated serum creatinine 10/07/2019   Mixed hyperlipidemia 06/17/2018   Influenza vaccination declined 06/17/2018   Obesity (BMI 30-39.9) 06/17/2018   Port-A-Cath in place 09/08/2017   Other fatigue 08/29/2017   Anemia 08/29/2017   High risk medication use 08/29/2017   Syncope 08/25/2017   Blind in both eyes 08/25/2017   Genetic testing 06/19/2017   Family history of colon cancer    Malignant neoplasm of lower-outer quadrant of left breast of female, estrogen receptor negative ([place]) 06/11/2017   Cholelithiasis 05/19/2017   Coronary artery calcification seen on CAT scan 05/19/2017   Low grade squamous intraepith lesion on cytologic smear cervix ([place]) 04/09/2017   HPV (human papilloma virus) infection 04/09/2017   COPD (chronic obstructive pulmonary disease) ([place])    Essential hypertension 03/20/2017   Screen for colon cancer 03/20/2017   Retinitis pigmentosa    Blind    Solitary pulmonary nodule on lung CT 03/16/2016    Abnormal myocardial perfusion study 03/15/2016   Chronic combined systolic and diastolic heart failure ([place]) 12/28/2015   Dilated cardiomyopathy ([place]) 12/27/2015    PCP: Dr Jill Alexanders  REFERRING PROVIDER: Cira Rue, NP  REFERRING DIAG: Chemo induced peripheral neuropathy  THERAPY DIAG:  Chemotherapy-induced neuropathy (H[facility])  Abnormal posture  Muscle weakness (generalized)  Lymphedema, not elsewhere classified  Difficulty in walking, not elsewhere classified  ONSET DATE: 2019 with exacerbation  Rationale for Evaluation and Treatment: Rehabilitation  SUBJECTIVE:  SUBJECTIVE STATEMENT: I am having so much pain  PERTINENT HISTORY:  Pt had a left lumpectomy with SLNB on 06/26/2017 for triple negative cancer with 0/3 LN's. She had adjuvant chemotherapy for 4 cycles followed by radiation. She had subsequent bone metastasis and had palliative radiation to the right iliac region in Jan-Feb 2022. She also had chemotherapy with Taxol in Feb 2022 stopped after the 4th cycle due to neuropathy and was switched to Xeloda. In November of 2022 she was noted to have disease progression with increased number and size of enlarged LN's in retroperitoneum, pelvic sidewall and upper thigh.. She progressed on [place] and most recently [place] ending 07/05/2022. She developed moderate neuropathy and it was stopped. She had a great response but now wants to try 1 month without treatment.. Negative doppler for DVT regarding swelling in Right LE  PAIN:  Are you having pain? Yes NPRS scale: 9/10 Right LE  Pain location: right leg Pain orientation: Right  PAIN TYPE: aching and numb Pain description: constant  Aggravating factors: sitting gets more swollen, standing, walking Relieving factors: laying down  with leg elevated  PRECAUTIONS: chronic systolic and diastolic heart failure EF 45-50%, right LE swelling, DM, hypertension, COPD,retinitis pigmentosa (limits vision legally blind) left UE lymphedema risk, bone metastasis  WEIGHT BEARING RESTRICTIONS: No  FALLS:  Has patient fallen in last 6 months? No  LIVING ENVIRONMENT: Lives with: lives with their family, lives with their son, and lives with their daughter Lives in: House/apartment Stairs: Yes; External: 4 steps; can reach both Has following equipment at home: Single point cane and shower chair, blind cane  OCCUPATION: retired Marine scientist  LEISURE: bowling, crafts,puzzles  HAND DOMINANCE: right   PRIOR LEVEL OF FUNCTION: Independent with household mobility with device  PATIENT GOALS: be more active, get swelling in right LE down,improve balance/gait   OBJECTIVE:  COGNITION: Overall cognitive status: Within functional limits for tasks assessed   PALPATION: Moderate pitting edema right lower leg, less so in upper leg  OBSERVATIONS / OTHER ASSESSMENTS: Significant right LE swelling foot to groin limiting mobility, ambulation, standing and sitting  SENSATION: Light touch: Deficits pt has sensation but it is decreased bilateral LE's   POSTURE: forward head, rounded shoulders  LOWER EXTREMITY AROM/PROM:  A/PROM Right eval  Hip flexion   Hip extension   Hip abduction   Hip adduction   Hip internal rotation   Hip external rotation   Knee flexion   Knee extension   Ankle dorsiflexion   Ankle plantarflexion   Ankle inversion   Ankle eversion    (Blank rows = not tested)  A/PROM LEFT eval  Hip flexion   Hip extension   Hip abduction   Hip adduction   Hip internal rotation   Hip external rotation   Knee flexion   Knee extension   Ankle dorsiflexion   Ankle plantarflexion   Ankle inversion   Ankle eversion    (Blank rows = not tested)  LOWER EXTREMITY MMT:   LYMPHEDEMA ASSESSMENTS:  SURGERY TYPE/DATE:  06/26/2017 Left lumpectomy with SLNB for Triple Negative cancer NUMBER OF LYMPH NODES REMOVED: 0/3 CHEMOTHERAPY: Yes, multiple for breast cancer and metastasis RADIATION:YES for breast and iliac regions due to metastasis HORMONE TREATMENT: NO INFECTIONS: NO  LYMPHEDEMA ASSESSMENTS:       LOWER EXTREMITY LANDMARK RIGHT eval 09/06/22  At groin 83 86  30 cm proximal to suprapatella    20 cm proximal to suprapatella 77 78.5  10 cm proximal to suprapatella 65.5  62  At midpatella / popliteal crease 57.5 57.5  30 cm proximal to floor at lateral plantar foot 43.5 43.8  20 cm proximal to floor at lateral plantar foot 32.8 32.7  10 cm proximal to floor at lateral plantar foot 27.3   Circumference of ankle/heel    5 cm proximal to 1st MTP joint    Across MTP joint 24   Around proximal great toe 8.5   (Blank rows = not tested)  LOWER EXTREMITY LANDMARK LEFT eval  At groin 67  30 cm proximal to suprapatella   20 cm proximal to suprapatella 63.8  10 cm proximal to suprapatella 52.5  At midpatella / popliteal crease 42.2  30 cm proximal to floor at lateral plantar foot 35.5  20 cm proximal to floor at lateral plantar foot 16.0  10 cm proximal to floor at lateral plantar foot 14.1  Circumference of ankle/heel   5 cm proximal to 1st MTP joint   Across MTP joint 22.5  Around proximal great toe 7.9  (Blank rows = not tested)  FUNCTIONAL TESTS:  30 sec sit to stand  9 reps using arms on chair TUG: 16 sec, 18 sec with use of arms and cane Balance: DLS able to maintain 10 sec, half tandem left forward 7 seconds, right forward 10 sec  GAIT: Distance walked: 40 feet Assistive device utilized: Single point canehandle backwards and using on right side Level of assistance: Complete Independence Comments: lurching type gait, excessive lateral motion decreased heel strike, toe off,   TODAY'S TREATMENT:                                                                                                                                          DATE:  09/06/22: Pt arrives with increased pain 9/10 and difficulty even walking back to the room.  She reports that she feels increased burning and pain in the Rt thigh which started around 2 hours after last treatment.   Remeasured LE which is around 3cm larger in the upper thigh Discussed with evaluating PT about POC and response to therapy and discussed with pt that we should hold on therapy due to response to treatment and that she may feel better and tolerate things better a few weeks into chemo if there is a space occupying lesion/LN mass.   Discussed use of tg soft (not unless it helps) and focusing on ADLs for exercise for now.   09/04/22: Manual Therapy MLD: Short neck, 5 diaphragmatic breaths (pt with improved technique today), Rt axillary nodes, Rt inguino-axillary anastomosis, then worked on Rt upper leg on lateral thigh, medial to lateral and then lateral again, knee, lower leg on anterior and posterior surfaces then ended at ankle/foot toes and then retraced all steps to back to anastomosis and axillary LN's; continued instructing pt and having her return demo with hand over hand technique After MLD applied  cocoa butter to Rt LE and 2 TG soft stockinettes from last session: large to thigh and medium to lower leg but no compression bandages today as pt did not tolerate bandages from last session  09/02/22: Manual Therapy MLD: Began instructing pt and daughter in basics of lymphatic systema nd principles of MLD while performing following: Short neck, 5 diaphragmatic breaths (cuing for technique here), Lt inguinal and Rt axillary nodes, anterior inter-inguinal and Lt inguino-axillary anastomosis, then worked on Rt upper leg on lateral thigh, medial to latearl and then lateral again and retraced all steps to anastomosis; began instructing pt in this having her return demo Compression Bandaging: Cocoa butter applied to entire Rt LE: TG soft size  large to thigh, and then TG soft size med from foot to knee then bandages as follows: Elastomull to toes 1-4, artiflex x1 from foot to knee, and then 1-6 cm to foot ankle (combined Roman sandal and ASH technique with this one bandage to start light pressure) and 1-10 cm from ankle to knee. Pt had some discomfort with donning TG soft on her thigh but once this was in place she reports it feeling much better on her leg. Showed daughter how to be mindful of keeping top band folded over so as to prevent TG soft from rolling down on her leg, especially at posterior thigh. Pt reports bandages feeling comfortable by end of session and was educated in remedial exercises to perform if she notices any increased tingling or pain that isn't from her CIPN. Then if any possible symptoms don't improve to remove them and bring them back.   08/29/2022  PATIENT EDUCATION:  Education details: Remedial exs with compression bandages on ; self MLD Person educated: Patient and daughter Education method: Explanation, Demonstration Education comprehension: verbalized understanding, returned demo, will benefit from further review  HOME EXERCISE PROGRAM: 09/02/22: Remedial exs and to doff bandages if she has any increased pain/tingling that isn't alleviated by exs; self MLD 1/18/2024None given at eval  ASSESSMENT:  CLINICAL IMPRESSION: See above for daily impression note.  Cancelled next week visits due to poor response to treatment and pt will let us know 09/16/22 if she is ready to start again or if we need to wait for more chemo effect.    OBJECTIVE IMPAIRMENTS: Abnormal gait, decreased activity tolerance, decreased balance, decreased endurance, decreased knowledge of condition, difficulty walking, decreased strength, increased edema, increased muscle spasms, and pain.   ACTIVITY LIMITATIONS: carrying, lifting, sitting, standing, squatting, sleeping, bed mobility, dressing, locomotion level, and normal household  chores  PARTICIPATION LIMITATIONS: meal prep, laundry, driving, community activity, and normal household chores  PERSONAL FACTORS: 3+ comorbidities: Left lumpectomy s/p chemo and radiation, bone and LN metastasis, legally blind, DM, CHF ,COPD are also affecting patient's functional outcome.   REHAB POTENTIAL: Good  CLINICAL DECISION MAKING: Evolving/moderate complexity  EVALUATION COMPLEXITY: Moderate  GOALS: Goals reviewed with patient? Yes  SHORT TERM GOALS: Target date: 09/26/2022  Pt/pts children will be independent in compression bandaging to the Right LE Baseline: Goal status: INITIAL  2.  Pt will be tolerant of compression bandaging and will have reduction in Right LE swelling without any deleterious effects IE: SOB Baseline:  Goal status: INITIAL  3. Pt will have reduction in swelling at 20 cm above suprapatella by 4 cm  Baseline approx 77 cm Goal Status: INITIAL  4. Pt will have decreased edema at 10 cm prox to floor by4 cm Baseline 12 cm Goal status: initial  LONG TERM GOALS: Target  date: 10/23/2022  Pt will have edema reduction at 20 cm prox to suprapatella by 7 cm for easier mobility Baseline:  Goal status: INITIAL  2.  Pt will have edema reduction at 10 cm prox to floor by 6 cm or more to improve mobility Baseline:  Goal status: INITIAL  3.  Pt will be able to perform 14 sit to stands in 30 seconds to decrease fall risk Baseline:  Goal status: INITIAL  4.  Pt will perform TUG in 12 seconds or less to decrease fall risk Baseline:  Goal status: INITIAL  5. Pt will be fit for compression garment to hold right LE swelling  Goal status:INITIAL  PLAN:  PT FREQUENCY: 3x/week  PT DURATION: 8 weeks  PLANNED INTERVENTIONS: Therapeutic exercises, Neuromuscular re-education, Balance training, Gait training, Patient/Family education, Self Care, Orthotic/Fit training, Manual lymph drainage, Compression bandaging, Vasopneumatic device, Manual therapy, and  Re-evaluation  PLAN FOR NEXT SESSION: Reassess upon return, MLD to right LE, instruct and issue initial HEP: standing bil LE hip 3 way raises then later start NM, strength, gait exs, assess MMT/ROM prn   Stark Bray, PT 09/06/2022, 8:41 AM  Start with circles near neck, 5 times each side   Cancer Rehab [phone] Deep Effective Breath   Standing, sitting, or laying down place both hands on the belly. Take a deep breath IN, expanding the belly; then breath OUT, contracting the belly. Repeat __5__ times. Do __2-3__ sessions per day and before each self massage.  Inguinal Nodes to Axilla - Clear   On involved side, at armpit, make _5__ in-place circles. Then from hip proceed in sections to armpit with stationary circles or pumps _5_ times, this is your pathway. Do _1__ time per day.  Copyright  VHI. All rights reserved.  LEG: Knee to Hip - Clear   Pump up outer thigh of involved leg from knee to outer hip. Then do stationary circles from inner to outer thigh, then do outer thigh again. Next, interlace fingers behind knee IF ABLE and make in-place circles. Do _5_ times of each sequence.  Do _1__ time per day.  Copyright  VHI. All rights reserved.  LEG: Ankle to Hip Sweep   Hands on sides of ankle of involved leg, pump _5__ times up both sides of lower leg, then retrace steps up outer thigh to hip as before and back to pathway. Do _2-3_ times. Do __1_ time per day.  Copyright  VHI. All rights reserved.  FOOT: Dorsum of Foot and Toes Massage   One hand on top of foot make _5_ stationary circles or pumps, then either on top of toes or each individual toe do _5_ pumps. Then retrace all steps pumping back up both sides of lower leg, outer thigh, and then pathway. Finish with what you started with, _5_ circles at involved side arm pit. All _2-3_ times at each sequence. Do _1__ time per day.

## 2022-09-06 NOTE — Telephone Encounter (Signed)
Pt called stating that she's been doing PT and her PT has been put on hold d/t pt's increase pain from her neuropathy.  Pt stated she's having burning in her inner upper thighs.  Pt stated she's currently taking Gabapentin but has been taking more than what's prescribed d/t the increased pain.  Pt stated she's been taking '300mg'$  TID as a result pt is now out of medication and would like a new prescription to be sent into her insurance.  Pt stated she increased her dose d/t current dose was not helping in managing her neuropathy.  Informed pt that this RN will notify Dr. Burr Medico and Jobe Gibbon, NP.

## 2022-09-09 ENCOUNTER — Ambulatory Visit: Payer: 59

## 2022-09-10 NOTE — Progress Notes (Unsigned)
[place]   [phone] [phone]) [phone]   Clinic Follow up Note   Patient Care Team: [name], MD as PCP - General (Hematology) [name], MD as PCP - Cardiology (Cardiology) [name], [name], MD as PCP - Advanced Heart Failure (Cardiology) [name], MD as Consulting Physician (Hematology) [name], MD as Consulting Physician (General Surgery) [name], [facility] (Inactive) as Pharmacist (Pharmacist) Jacelyn Pi, MD as Referring Physician (Endocrinology)  Date of Service:  09/11/2022  CHIEF COMPLAINT: f/u of metastatic breast cancer   CURRENT THERAPY:  Carbolpatin,+Gemcitabine q21d , Pembrolizumab q21  ASSESSMENT:  Kristin Ward is a 67 y.o. female with   Malignant neoplasm of lower-outer quadrant of left breast of female, estrogen receptor negative ([place]) stage IB, p(T1cN0M0), Triple negative, Grade 3, in 2018, metastatic disease in 08/2020 ER 20% weakly + PR/HER2 (0) negative, PD-L1 0%, genetics (-), HER2 1+ on node biopsy  -diagnosed with triple negative breast cancer in 05/2017. S/p left breast lumpectomy, adjuvant chemo TC and Radiation.  -biopsy of right gluteus on 09/02/20 due to new pain showed metastatic carcinoma, consistent with breast primary. ER 20% weakly positive, PR and HER2 negative.  -s/p multiple line chemo, including taxol, xeloda, Trodelvy, enhertu -she was on Halavan, but developed worsening neuropathy and stopped after 07/05/22 and she requested a chemo break  -I recommended changing carboplatin and gemcitabine, plus Keytruda. Will start today   Malignant neoplasm metastatic to bone [facility]) -She developed radiating right hip pain in 2020 from right iliac wing mass. S/p palliative radiation to right iliac 09/04/20 - 09/22/20 with Dr. Lisbeth Renshaw. -Given localized bone met, she started Zometa q86month on 11/20/20  Peripheral neuropathy due to chemotherapy ([facility] -S/p C2D8 Taxol she developed mild tingling in her  hands and feet. Taxol stopped after 12/18/20.  -worsening with chemo   Cancer related pain -received multiple palliative RT -on oxycodone and garbapentin -f/u with palliative care clinic   RLE edema -Likely related to her cancer -Doppler was negative in early January 2024 -Continue physical therapy, compression stocks, and leg elevation -Due to her slightly worsening creatinine, and the lack of clinical benefit, will stop her furosemide.   PLAN: -Lab reviewed, adequate for treatment, will proceed cycle 1 day 1 carboplatin, gemcitabine and Keytruda today -She will return in 1 week for cycle 1 day 8 chemo -Follow-up in 3 weeks before cycle 2 -She will stop furosemide, we will monitor her renal function closely.   SUMMARY OF ONCOLOGIC HISTORY: Oncology History Overview Note  Cancer Staging Malignant neoplasm of lower-outer quadrant of left breast of female, estrogen receptor negative [place] Staging form: Breast, AJCC 8th Edition - Clinical stage from 06/06/2017: Stage IB (cT1c, cN0, cM0, G3, ER-, PR-, HER2-) - Signed by FTruitt Merle MD on 06/15/2017 Nuclear grade: G3 Histologic grading system: 3 grade system - Pathologic stage from 06/26/2017: Stage IB (pT1c, pN0, cM0, G3, ER-, PR-, HER2-) - Signed by FTruitt Merle MD on 07/10/2017 Neoadjuvant therapy: No Nuclear grade: G3 Multigene prognostic tests performed: None Histologic grading system: 3 grade system Laterality: Left     Malignant neoplasm of lower-outer quadrant of left breast of female, estrogen receptor negative [place]  05/30/2017 Mammogram   Diagnostic mammo and [place]PRESS[place]: 1. Highly suspicious 1.4 cm mass in the slightly lower slightly outer left breast -tissue sampling recommended. 2. Indeterminate 0.5 mm mass in the slightly lower slightly outer left breast -tissue sampling recommended. 3. At least 2 left axillary lymph nodes with borderline cortical thickness.  06/06/2017 Initial Biopsy   Diagnosis 1. Breast,  left, needle core biopsy, 5:30 o'clock - INVASIVE DUCTAL CARCINOMA, G3 2. Lymph node, needle/core biopsy, left axillary - NO CARCINOMA IDENTIFIED IN ONE LYMPH NODE (0/1)   06/06/2017 Initial Diagnosis   Malignant neoplasm of lower-outer quadrant of left breast of female, estrogen receptor negative ([place])   06/06/2017 Receptors her2   Estrogen Receptor: 0%, NEGATIVE Progesterone Receptor: 0%, NEGATIVE Proliferation Marker Ki67: 70%   06/26/2017 Surgery   LEFT BREAST LUMPECTOMY WITH RADIOACTIVE SEED AND SENTINEL LYMPH NODE BIOPSY ERAS  PATHWAY AND INSERTION PORT-A-CATH By Dr. Barry Dienes on 06/26/17    06/26/2017 Pathology Results   Diagnosis 06/26/17  1. Breast, lumpectomy, Left - INVASIVE DUCTAL CARCINOMA, GRADE III/III, SPANNING 1.2 CM. - THE SURGICAL RESECTION MARGINS ARE NEGATIVE FOR CARCINOMA. - SEE ONCOLOGY TABLE BELOW. 2. Lymph node, sentinel, biopsy, Left axillary #1 - THERE IS NO EVIDENCE OF CARCINOMA IN 1 OF 1 LYMPH NODE (0/1). 3. Lymph node, sentinel, biopsy, Left axillary #2 - THERE IS NO EVIDENCE OF CARCINOMA IN 1 OF 1 LYMPH NODE (0/1). 4. Lymph node, sentinel, biopsy, Left axillary #3 - THERE IS NO EVIDENCE OF CARCINOMA IN 1 OF 1 LYMPH NODE (0/1).    07/25/2017 - 09/29/2017 Chemotherapy   Adjuvant cytoxan and docetaxel (TC) every 3 weekd for 4 cycles     11/19/2017 - 12/17/2017 Radiation Therapy   Adjuvant breast radiation Left breast treated to 42.5 Gy with 17 fx of 2.5 Gy followed by a boost of 7.5 Gy with 3 fx of 2.5 Gy      02/2019 Procedure   She had PAC removal in 02/2019.    07/23/2020 Imaging   MRI Lumbar Spine 07/23/20 IMPRESSION: 1. No significant disc herniation, spinal canal or neural foraminal stenosis at any level. 2. Moderate facet degenerative changes at L3-4, L4-5 and L5-S1. 3. Multiple enlarged retroperitoneal and right iliac lymph nodes. Recommend correlation with CT of the abdomen and pelvis with contrast.   08/15/2020 Imaging   CT AP 08/15/20   IMPRESSION: 1. Right iliac and periaortic adenopathy is noted concerning for metastatic disease or lymphoma. Also noted is abnormal soft tissue mass anterior and posterior to the right iliac wing concerning for malignancy or metastatic disease. MRI is recommended for further evaluation. 2. Irregular lucency with sclerotic margins is seen involving the right iliac crest concerning for possible lytic lesion. MRI may be performed for further evaluation. 3. Enlarged fibroid uterus. 4. Small gallstone. 5. Aortic atherosclerosis.   08/22/2020 Pathology Results   FINAL MICROSCOPIC DIAGNOSIS: 08/22/20  A. NEEDLE CORE, RIGHT GLUTEUS MINIMUMUS, BIOPSY:  -  Metastatic carcinoma  -  See comment   COMMENT:  By immunohistochemistry, the neoplastic cells are positive for  cytokeratin-7 and have weak patchy positivity for GATA3 but are negative for cytokeratin-20 and GCDFP.  The combined morphology and immunophenotype, support metastasis from the patient's known breast primary carcinoma.   ADDENDUM:  PROGNOSTIC INDICATOR RESULTS:  The tumor cells are NEGATIVE for Her2 (0).  Estrogen Receptor:       POSITIVE, 20%, WEAK STAINING  Progesterone Receptor:   NEGATIVE   PD-L1 (0) negative    09/04/2020 - 09/22/2020 Radiation Therapy   Palliative radiation to right iliac 09/04/20 - 09/22/20, Dr. Lisbeth Renshaw   09/26/2020 -  Chemotherapy   first line weekly Taxol 3 weeks on/1 week off starting 09/26/20 --Reduced to 2 weeks on/1 week off starting with C3 due to neuropathy. Stopped after C4 on 12/18/20 due to neuratrophy.  --Switched  to Xeloda on 01/01/21 at '1500mg'$  in the AM and '2000mg'$  in the PM for 2 weeks on/1 week off     10/13/2020 Procedure    PAC placement on 10/13/20.    04/10/2021 Imaging   CT CAP  IMPRESSION: 1. Enlarging abdominopelvic retroperitoneal and right inguinal adenopathy, compatible with disease progression. 2. Lytic metastasis in the right iliac wing, similar. 3. Hepatomegaly. 4.  Cholelithiasis. 5. Enlarged fibroid uterus. 6.  Aortic atherosclerosis (ICD10-I70.0).   06/18/2021 Imaging   CT CAP  IMPRESSION: 1. Today's study demonstrates progression of metastatic disease as evidence by increased number and size of numerous enlarged lymph nodes in the retroperitoneum, along the right pelvic sidewall, and in the upper right thigh. 2. Osseous metastases in the bony pelvis appear similar to the prior examination. No definite new osseous lesions are otherwise noted. 3. Fibroid uterus again noted. 4. Cholelithiasis without evidence of acute cholecystitis. 5. Aortic atherosclerosis, in addition to left anterior descending coronary artery disease. Please note that although the presence of coronary artery calcium documents the presence of coronary artery disease, the severity of this disease and any potential stenosis cannot be assessed on this non-gated CT examination. Assessment for potential risk factor modification, dietary therapy or pharmacologic therapy may be warranted, if clinically indicated. 6. Additional incidental findings, as above.   06/20/2021 Imaging   Bone Scan  IMPRESSION: No osseous uptake suspicious for recurrent/progressive metastatic disease.   07/03/2021 - 01/11/2022 Chemotherapy   Patient is on Treatment Plan : BREAST METASTATIC Sacituzumab govitecan-hziy [name]) q21d     09/27/2021 Imaging   EXAM: CT CHEST, ABDOMEN, AND PELVIS WITH CONTRAST  IMPRESSION: 1. Marked interval improvement in previously demonstrated lymphadenopathy in the abdomen and pelvis. A few prominent right inguinal lymph nodes remain. 2. Stable osseous metastatic disease in the pelvis. 3. No evidence of local recurrence or other metastatic disease. 4. Stable incidental findings including cholelithiasis, uterine fibroids and Aortic Atherosclerosis (ICD10-I70.0).  ADDENDUM: Not mentioned in the original impression is a possible nonocclusive fibrin sheath along the distal  aspect of the Port-A-Cath.   09/27/2021 Imaging   EXAM: NUCLEAR MEDICINE WHOLE BODY BONE SCAN  IMPRESSION: Stable examination, without abnormal osseous uptake.   11/27/2021 Imaging   CT AP IMPRESSION: 1. Stable lytic and sclerotic lesion in the RIGHT iliac wing. 2. No evidence of visceral metastasis or adenopathy in the abdomenpelvis. 3. Leiomyomatous uterus.  ADDENDUM REPORT: 11/29/2021 09:33 Upon further review and discussion with Cira Rue NP, the RIGHT inguinal lymph nodes previous described have increased in volume. For example 14 mm node (image 91/2) is increased from 9 mm. Adjacent 14 mm node on image 86 is increased from 8 mm. Additionally, LEFT common iliac lymph node is also slightly increased measuring 10 mm (46/2) compared to 7 mm on comparison exam from 09/27/2021. These sites demonstrates similar but greater lymphadenopathy on CT 06/18/2021.   02/15/2022 - 03/08/2022 Chemotherapy   Patient is on Treatment Plan : BREAST METASTATIC fam-trastuzumab deruxtecan-nxki (Enhertu) q21d     02/15/2022 - 05/10/2022 Chemotherapy   Patient is on Treatment Plan : BREAST METASTATIC Fam-Trastuzumab Deruxtecan-nxki (Enhertu) (5.4) q21d     06/07/2022 - 07/05/2022 Chemotherapy   Patient is on Treatment Plan : BREAST METASTATIC Eribulin D1,8 q21d     07/26/2022 - 07/26/2022 Chemotherapy   Patient is on Treatment Plan : HEAD/NECK Pembrolizumab (200) q21d     09/11/2022 -  Chemotherapy   Patient is on Treatment Plan : HEAD/NECK Pembrolizumab (200) q21d     09/11/2022 -  Chemotherapy   Patient is on Treatment Plan : BREAST Carboplatin + Gemcitabine D1,8 q21d     Neuropathy associated with cancer ([place])  11/21/2021 Initial Diagnosis   Neuropathy associated with cancer [facility])      INTERVAL HISTORY:  Kristin Ward is here for a follow up of  metastatic breast cancer She was last seen by  NP Lacie  on 08/14/2022 She presents to the clinic by herself.  Her right lower extremity edema has  been worse since last visit, she is on physical therapy.  She has not been able to walk much lately.  She also has significant pain in the right groin area.    All other systems were reviewed with the patient and are negative.  MEDICAL HISTORY:  Past Medical History:  Diagnosis Date   Abnormal x-ray of lungs with single pulmonary nodule 03/15/2016   per records from [place]    Anemia    Asthma    Cholelithiasis 05/19/2017   On CT   Coronary artery calcification seen on CAT scan 05/19/2017   Diabetes mellitus without complication ([place])    type 2   Diabetes mellitus, new onset ([place]) 01/20/2019   Dilated idiopathic cardiomyopathy ([place]) 12/2015   EF 15-20%. Diagnosed in [facility]   Headache    NONE RECENT   Hypertension    Lymph nodes enlarged 07/25/2020   Malignant neoplasm of lower-outer quadrant of left breast of female, estrogen receptor negative ([place]) 06/11/2017   left breast   Mixed hyperlipidemia 06/17/2018   partially blind    Personal history of chemotherapy 2018-2019   Personal history of radiation therapy 2019   Retinitis pigmentosa    Retroperitoneal lymphadenopathy 07/25/2020   Uveitis     SURGICAL HISTORY: Past Surgical History:  Procedure Laterality Date   brain cyst removed  2007   to help with headaches per patient , aspirated    BREAST BIOPSY Left 06/06/2017   x2   BREAST CYST ASPIRATION Left 06/06/2017   BREAST LUMPECTOMY Left 06/26/2017   BREAST LUMPECTOMY WITH RADIOACTIVE SEED AND SENTINEL LYMPH NODE BIOPSY Left 06/26/2017   Procedure: BREAST LUMPECTOMY WITH RADIOACTIVE SEED AND SENTINEL LYMPH NODE BIOPSY ERAS  PATHWAY;  Surgeon: [name], MD;  Location: [place];  Service: General;  Laterality: Left;  pec block   FINE NEEDLE ASPIRATION Left 02/23/2019   Procedure: FINE NEEDLE ASPIRATION;  Surgeon: [name], MD;  Location: [place];  Service: General;  Laterality: Left;  Asiration of left breast seroma    IR  IMAGING GUIDED PORT INSERTION  10/13/2020   NASAL TURBINATE REDUCTION     PORT-A-CATH REMOVAL Right 02/23/2019   Procedure: REMOVAL PORT-A-CATH;  Surgeon: [name], MD;  Location: [place];  Service: General;  Laterality: Right;   PORTACATH PLACEMENT N/A 06/26/2017   Procedure: INSERTION PORT-A-CATH;  Surgeon: [name], MD;  Location: [place];  Service: General;  Laterality: N/A;   RIGHT/LEFT HEART CATH AND CORONARY ANGIOGRAPHY N/A 07/31/2022   Procedure: RIGHT/LEFT HEART CATH AND CORONARY ANGIOGRAPHY;  Surgeon: Jolaine Artist, MD;  Location: [place] CV LAB;  Service: Cardiovascular;  Laterality: N/A;   TRANSTHORACIC ECHOCARDIOGRAM  12/2015   A) [facility] May 2017: Mild concentric LVH. Global hypokinesis. GR daily. EF 18% severe LA dilation. Mitral annular dilatation with papillary muscle dysfunction and moderate MR. Dilated IVC consistent with elevated RAP.   TRANSTHORACIC ECHOCARDIOGRAM  05/2017; 08/2017   a) Mild concentric LVH.  EF 45 to 50% with  diffuse hypokinesis.  GR 1 DD.  Mild aortic root dilation.;; b)  Mild LVH EF 45 to 50%.  Diffuse HK.  No significant valve disease.  No significant change.   TRANSTHORACIC ECHOCARDIOGRAM  01/30/2022   Pre-Enhertu:EF 35 to 40%.  Moderate dysfunction with global HK.  Abnormal strain.  Normal RV size and function.  Normal RVSP.  Aortic root estimated 42 mm.  Mild MR.  Mild/trivial AI.  Normal RVP.;  04/01/2022: Preliminary read 40%.  Formal read 45 to 50% (probably 40 to 45%).  Global HK.  GR 1 DD.  Normal RV size and RVP.  Mild MR.  Now measured at 38 mm.  Normal RAP.    I have reviewed the social history and family history with the patient and they are unchanged from previous note.  ALLERGIES:  is allergic to morphine and related, latex, tylenol with codeine #3 [acetaminophen-codeine], and penicillins.  MEDICATIONS:  Current Outpatient Medications  Medication Sig Dispense Refill   Accu-Chek FastClix  Lancets MISC TEST TWICE A DAY. PT USES AN ACCU-CHEK GUIDE ME METER 102 each 2   amLODipine (NORVASC) 5 MG tablet Take 5 mg by mouth daily as needed (high B/P).     Ascorbic Acid (VITAMIN C) 500 MG CAPS Take 500 mg by mouth daily.     aspirin 81 MG chewable tablet Chew 81 mg by mouth daily.     atorvastatin (LIPITOR) 40 MG tablet Take 1 tablet (40 mg total) by mouth daily. 90 tablet 3   brimonidine (ALPHAGAN) 0.2 % ophthalmic solution Place 1 drop into both eyes 2 (two) times daily.     carvedilol (COREG) 12.5 MG tablet TAKE 1 AND 1/2 TABLETS BY MOUTH TWICE DAILY qm 90 tablet 10   Cholecalciferol (VITAMIN D) 50 MCG (2000 UT) CAPS Take 2,000 Units by mouth daily.     empagliflozin (JARDIANCE) 10 MG TABS tablet Take 1 tablet (10 mg total) by mouth daily before breakfast. 90 tablet 3   furosemide (LASIX) 20 MG tablet TAKE 1-2 TABLETS BY MOUTH AS NEEDED FOR WEIGHT GAIN 3 TO 4 LBS OR SIGNIFICANT LEG EDEMA 180 tablet 1   gabapentin (NEURONTIN) 300 MG capsule Take 2 caps twice a day during the day and 3 caps at bedtime 210 capsule 0   HUMALOG KWIKPEN 100 UNIT/ML KwikPen Inject 3-10 Units into the skin 3 (three) times daily before meals. Per sliding scale     ibuprofen (ADVIL) 800 MG tablet Take 1 tablet (800 mg total) by mouth every 8 (eight) hours as needed. 30 tablet 2   Lancets Misc. (ACCU-CHEK SOFTCLIX LANCET DEV) KIT 1 Units by Does not apply route 2 (two) times daily. Check FSBS BID - Include strips # 50 with 5 refills, Lancets #50 with 5 refills DX: Type 2 DM - ICD 10: E11.9 1 kit 0   lidocaine-prilocaine (EMLA) cream Apply 1 application topically as needed. 30 g 0   linaclotide (LINZESS) 72 MCG capsule Take 1 capsule (72 mcg total) by mouth daily as needed (constipation). 30 capsule 2   magic mouthwash (nystatin, lidocaine, diphenhydrAMINE, alum & mag hydroxide) suspension Swish and swallow 5 mLs by mouth 4 (four) times daily as needed for mouth pain 140 mL 0   Multiple Vitamins-Minerals (WOMENS  MULTI PO) Take 15 mLs by mouth daily.     ondansetron (ZOFRAN) 8 MG tablet Take 1 tablet (8 mg total) by mouth every 8 (eight) hours as needed for nausea or vomiting (begin on day 3 after chemo). 30 tablet  1   oxyCODONE (OXY IR/ROXICODONE) 5 MG immediate release tablet Take 1 tablet (5 mg total) by mouth every 6 (six) hours as needed for severe pain. 60 tablet 0   pantoprazole (PROTONIX) 40 MG tablet TAKE 1 TABLET BY MOUTH EVERY DAY 90 tablet 1   prochlorperazine (COMPAZINE) 10 MG tablet Take 1 tablet (10 mg total) by mouth every 6 (six) hours as needed (Nausea or vomiting). 30 tablet 1   promethazine (PHENERGAN) 25 MG tablet Take 1 tablet (25 mg total) by mouth every 6 (six) hours as needed for nausea or vomiting. 30 tablet 0   sacubitril-valsartan (ENTRESTO) 97-103 MG Take 1 tablet by mouth 2 (two) times daily. 60 tablet 10   spironolactone (ALDACTONE) 25 MG tablet Take 1 tablet (25 mg total) by mouth daily. 30 tablet 10   vitamin B-12 (CYANOCOBALAMIN) 1000 MCG tablet Take 1,000 mcg by mouth daily.     zinc gluconate 50 MG tablet Take 50 mg by mouth daily.     Current Facility-Administered Medications  Medication Dose Route Frequency Provider Last Rate Last Admin   insulin glulisine (APIDRA) injection 6 Units  6 Units Subcutaneous Once Williams, Lynne B, PA-C       Facility-Administered Medications Ordered in Other Visits  Medication Dose Route Frequency Provider Last Rate Last Admin   sodium chloride flush (NS) 0.9 % injection 10 mL  10 mL Intracatheter PRN [name], MD   10 mL at 09/11/22 1547    PHYSICAL EXAMINATION: ECOG PERFORMANCE STATUS: 2 - Symptomatic, <50% confined to bed  There were no vitals filed for this visit. Wt Readings from Last 3 Encounters:  09/11/22 225 lb 4 oz (102.2 kg)  08/14/22 220 lb 9.6 oz (100.1 kg)  08/09/22 233 lb (105.7 kg)     GENERAL:alert, no distress and comfortable SKIN: skin color, texture, turgor are normal, no rashes or significant  lesions EYES: normal, Conjunctiva are pink and non-injected, sclera clear NECK: supple, thyroid normal size, non-tender, without nodularity LYMPH:  no palpable lymphadenopathy in the cervical, axillary  LUNGS: clear to auscultation and percussion with normal breathing effort HEART: regular rate & rhythm and no murmurs, (+) significant data right lower extremity edema, especially in the thigh area ABDOMEN:abdomen soft, non-tender and normal bowel sounds Musculoskeletal:no cyanosis of digits and no clubbing  NEURO: alert & oriented x 3 with fluent speech, no focal motor/sensory deficits  LABORATORY DATA:  I have reviewed the data as listed    Latest Ref Rng & Units 09/11/2022   11:36 AM 08/14/2022   11:16 AM 07/31/2022   10:03 AM  CBC  WBC 4.0 - 10.5 K/uL 6.3  4.0    Hemoglobin 12.0 - 15.0 g/dL 11.0  10.7  8.8   Hematocrit 36.0 - 46.0 % 33.4  32.2  26.0   Platelets 150 - 400 K/uL 303  269          Latest Ref Rng & Units 09/11/2022   11:36 AM 08/14/2022   11:16 AM 07/31/2022   10:03 AM  CMP  Glucose 70 - 99 mg/dL 107  99    BUN 8 - 23 mg/dL 18  15    Creatinine 0.44 - 1.00 mg/dL 1.22  0.69    Sodium 135 - 145 mmol/L 139  140  146   Potassium 3.5 - 5.1 mmol/L 4.1  3.9  3.5   Chloride 98 - 111 mmol/L 107  108    CO2 22 - 32 mmol/L 28  27  Calcium 8.9 - 10.3 mg/dL 9.1  9.6    Total Protein 6.5 - 8.1 g/dL 7.0  6.9    Total Bilirubin 0.3 - 1.2 mg/dL 0.6  0.7    Alkaline Phos 38 - 126 U/L 81  77    AST 15 - 41 U/L 20  22    ALT 0 - 44 U/L 9  12        RADIOGRAPHIC STUDIES: I have personally reviewed the radiological images as listed and agreed with the findings in the report. No results found.    Orders Placed This Encounter  Procedures   CBC with Differential ([place] Only)    Standing Status:   Future    Standing Expiration Date:   09/12/2023   CMP ([place] only)    Standing Status:   Future    Standing Expiration Date:   09/12/2023   CBC with Differential  ([place] Only)    Standing Status:   Future    Standing Expiration Date:   09/19/2023   CMP ([place] only)    Standing Status:   Future    Standing Expiration Date:   09/19/2023   CBC with Differential ([place] Only)    Standing Status:   Future    Standing Expiration Date:   10/03/2023   CMP ([place] only)    Standing Status:   Future    Standing Expiration Date:   10/03/2023   CBC with Differential ([place] Only)    Standing Status:   Future    Standing Expiration Date:   10/10/2023   CMP ([place] only)    Standing Status:   Future    Standing Expiration Date:   10/10/2023   CBC with Differential ([place] Only)    Standing Status:   Future    Standing Expiration Date:   10/03/2023   CMP ([place] only)    Standing Status:   Future    Standing Expiration Date:   10/03/2023   All questions were answered. The patient knows to call the clinic with any problems, questions or concerns. No barriers to learning was detected. The total time spent in the appointment was 30 minutes.     [name], MD 09/11/2022   Felicity Coyer, CMA, am acting as scribe for [name], MD.   I have reviewed the above documentation for accuracy and completeness, and I agree with the above.

## 2022-09-11 ENCOUNTER — Inpatient Hospital Stay (HOSPITAL_BASED_OUTPATIENT_CLINIC_OR_DEPARTMENT_OTHER): Payer: 59 | Admitting: Hematology

## 2022-09-11 ENCOUNTER — Encounter: Payer: Self-pay | Admitting: Hematology

## 2022-09-11 ENCOUNTER — Inpatient Hospital Stay: Payer: 59

## 2022-09-11 VITALS — BP 100/69 | HR 77 | Temp 98.3°F | Resp 18 | Wt 225.2 lb

## 2022-09-11 DIAGNOSIS — G620 Drug-induced polyneuropathy: Secondary | ICD-10-CM

## 2022-09-11 DIAGNOSIS — C7951 Secondary malignant neoplasm of bone: Secondary | ICD-10-CM

## 2022-09-11 DIAGNOSIS — Z7189 Other specified counseling: Secondary | ICD-10-CM

## 2022-09-11 DIAGNOSIS — J449 Chronic obstructive pulmonary disease, unspecified: Secondary | ICD-10-CM | POA: Diagnosis not present

## 2022-09-11 DIAGNOSIS — Z87891 Personal history of nicotine dependence: Secondary | ICD-10-CM | POA: Diagnosis not present

## 2022-09-11 DIAGNOSIS — I10 Essential (primary) hypertension: Secondary | ICD-10-CM | POA: Diagnosis not present

## 2022-09-11 DIAGNOSIS — I5042 Chronic combined systolic (congestive) and diastolic (congestive) heart failure: Secondary | ICD-10-CM | POA: Diagnosis not present

## 2022-09-11 DIAGNOSIS — H3552 Pigmentary retinal dystrophy: Secondary | ICD-10-CM | POA: Diagnosis not present

## 2022-09-11 DIAGNOSIS — T451X5A Adverse effect of antineoplastic and immunosuppressive drugs, initial encounter: Secondary | ICD-10-CM

## 2022-09-11 DIAGNOSIS — C7989 Secondary malignant neoplasm of other specified sites: Secondary | ICD-10-CM | POA: Diagnosis not present

## 2022-09-11 DIAGNOSIS — G893 Neoplasm related pain (acute) (chronic): Secondary | ICD-10-CM

## 2022-09-11 DIAGNOSIS — C50512 Malignant neoplasm of lower-outer quadrant of left female breast: Secondary | ICD-10-CM | POA: Diagnosis not present

## 2022-09-11 DIAGNOSIS — E1165 Type 2 diabetes mellitus with hyperglycemia: Secondary | ICD-10-CM | POA: Diagnosis not present

## 2022-09-11 DIAGNOSIS — C50919 Malignant neoplasm of unspecified site of unspecified female breast: Secondary | ICD-10-CM

## 2022-09-11 DIAGNOSIS — Z5111 Encounter for antineoplastic chemotherapy: Secondary | ICD-10-CM | POA: Diagnosis not present

## 2022-09-11 DIAGNOSIS — Z79899 Other long term (current) drug therapy: Secondary | ICD-10-CM | POA: Diagnosis not present

## 2022-09-11 DIAGNOSIS — Z5112 Encounter for antineoplastic immunotherapy: Secondary | ICD-10-CM | POA: Diagnosis not present

## 2022-09-11 DIAGNOSIS — Z95828 Presence of other vascular implants and grafts: Secondary | ICD-10-CM

## 2022-09-11 DIAGNOSIS — Z171 Estrogen receptor negative status [ER-]: Secondary | ICD-10-CM | POA: Diagnosis not present

## 2022-09-11 DIAGNOSIS — G629 Polyneuropathy, unspecified: Secondary | ICD-10-CM | POA: Diagnosis not present

## 2022-09-11 LAB — CMP (CANCER CENTER ONLY)
ALT: 9 U/L (ref 0–44)
AST: 20 U/L (ref 15–41)
Albumin: 3.4 g/dL — ABNORMAL LOW (ref 3.5–5.0)
Alkaline Phosphatase: 81 U/L (ref 38–126)
Anion gap: 4 — ABNORMAL LOW (ref 5–15)
BUN: 18 mg/dL (ref 8–23)
CO2: 28 mmol/L (ref 22–32)
Calcium: 9.1 mg/dL (ref 8.9–10.3)
Chloride: 107 mmol/L (ref 98–111)
Creatinine: 1.22 mg/dL — ABNORMAL HIGH (ref 0.44–1.00)
GFR, Estimated: 50 mL/min — ABNORMAL LOW (ref >60)
Glucose, Bld: 107 mg/dL — ABNORMAL HIGH (ref 70–99)
Potassium: 4.1 mmol/L (ref 3.5–5.1)
Sodium: 139 mmol/L (ref 135–145)
Total Bilirubin: 0.6 mg/dL (ref 0.3–1.2)
Total Protein: 7 g/dL (ref 6.5–8.1)

## 2022-09-11 LAB — CBC WITH DIFFERENTIAL (CANCER CENTER ONLY)
Abs Immature Granulocytes: 0.02 10*3/uL (ref 0.00–0.07)
Basophils Absolute: 0 10*3/uL (ref 0.0–0.1)
Basophils Relative: 0 %
Eosinophils Absolute: 0.2 10*3/uL (ref 0.0–0.5)
Eosinophils Relative: 3 %
HCT: 33.4 % — ABNORMAL LOW (ref 36.0–46.0)
Hemoglobin: 11 g/dL — ABNORMAL LOW (ref 12.0–15.0)
Immature Granulocytes: 0 %
Lymphocytes Relative: 12 %
Lymphs Abs: 0.8 10*3/uL (ref 0.7–4.0)
MCH: 30.4 pg (ref 26.0–34.0)
MCHC: 32.9 g/dL (ref 30.0–36.0)
MCV: 92.3 fL (ref 80.0–100.0)
Monocytes Absolute: 0.6 10*3/uL (ref 0.1–1.0)
Monocytes Relative: 9 %
Neutro Abs: 4.7 10*3/uL (ref 1.7–7.7)
Neutrophils Relative %: 76 %
Platelet Count: 303 10*3/uL (ref 150–400)
RBC: 3.62 MIL/uL — ABNORMAL LOW (ref 3.87–5.11)
RDW: 14.9 % (ref 11.5–15.5)
WBC Count: 6.3 10*3/uL (ref 4.0–10.5)
nRBC: 0 % (ref 0.0–0.2)

## 2022-09-11 LAB — MAGNESIUM: Magnesium: 2.1 mg/dL (ref 1.7–2.4)

## 2022-09-11 MED ORDER — PALONOSETRON HCL INJECTION 0.25 MG/5ML
0.2500 mg | Freq: Once | INTRAVENOUS | Status: AC
  Administered 2022-09-11: 0.25 mg via INTRAVENOUS
  Filled 2022-09-11: qty 5

## 2022-09-11 MED ORDER — SODIUM CHLORIDE 0.9 % IV SOLN: Freq: Once | INTRAVENOUS | Status: AC

## 2022-09-11 MED ORDER — SODIUM CHLORIDE 0.9 % IV SOLN
10.0000 mg | Freq: Once | INTRAVENOUS | Status: AC
  Administered 2022-09-11: 10 mg via INTRAVENOUS
  Filled 2022-09-11: qty 10

## 2022-09-11 MED ORDER — SODIUM CHLORIDE 0.9% FLUSH
10.0000 mL | INTRAVENOUS | Status: DC | PRN
  Administered 2022-09-11: 10 mL via INTRAVENOUS

## 2022-09-11 MED ORDER — HEPARIN SOD (PORK) LOCK FLUSH 100 UNIT/ML IV SOLN
500.0000 [IU] | Freq: Once | INTRAVENOUS | Status: AC | PRN
  Administered 2022-09-11: 500 [IU]

## 2022-09-11 MED ORDER — SODIUM CHLORIDE 0.9 % IV SOLN
200.0000 mg | Freq: Once | INTRAVENOUS | Status: AC
  Administered 2022-09-11: 200 mg via INTRAVENOUS
  Filled 2022-09-11: qty 8

## 2022-09-11 MED ORDER — SODIUM CHLORIDE 0.9 % IV SOLN
1000.0000 mg/m2 | Freq: Once | INTRAVENOUS | Status: AC
  Administered 2022-09-11: 2243 mg via INTRAVENOUS
  Filled 2022-09-11: qty 58.97

## 2022-09-11 MED ORDER — SODIUM CHLORIDE 0.9% FLUSH
10.0000 mL | INTRAVENOUS | Status: DC | PRN
  Administered 2022-09-11: 10 mL

## 2022-09-11 MED ORDER — SODIUM CHLORIDE 0.9 % IV SOLN
204.6000 mg | Freq: Once | INTRAVENOUS | Status: AC
  Administered 2022-09-11: 200 mg via INTRAVENOUS
  Filled 2022-09-11: qty 20

## 2022-09-11 NOTE — Assessment & Plan Note (Signed)
stage IB, p(T1cN0M0), Triple negative, Grade 3, in 2018, metastatic disease in 08/2020 ER 20% weakly + PR/HER2 (0) negative, PD-L1 0%, genetics (-), HER2 1+ on node biopsy  -diagnosed with triple negative breast cancer in 05/2017. S/p left breast lumpectomy, adjuvant chemo TC and Radiation.  -biopsy of right gluteus on 09/02/20 due to new pain showed metastatic carcinoma, consistent with breast primary. ER 20% weakly positive, PR and HER2 negative.  -s/p multiple line chemo, including taxol, xeloda, Trodelvy, enhertu -she was on [place], but developed worsening neuropathy and stopped after 07/05/22 and she requested a chemo break  -I recommended changing carboplatin and gemcitabine.

## 2022-09-11 NOTE — Patient Instructions (Signed)
[facility]  Discharge Instructions: Thank you for choosing [facility] to provide your oncology and hematology care.   If you have a lab appointment with the [facility], please go directly to the [facility] and check in at the registration area.   Wear comfortable clothing and clothing appropriate for easy access to any Portacath or PICC line.   We strive to give you quality time with your provider. You may need to reschedule your appointment if you arrive late (15 or more minutes).  Arriving late affects you and other patients whose appointments are after yours.  Also, if you miss three or more appointments without notifying the office, you may be dismissed from the clinic at the provider's discretion.      For prescription refill requests, have your pharmacy contact our office and allow 72 hours for refills to be completed.    Today you received the following chemotherapy and/or immunotherapy agents: Keytruda/Gemzar/Carboplatin      To help prevent nausea and vomiting after your treatment, we encourage you to take your nausea medication as directed.  BELOW ARE SYMPTOMS THAT SHOULD BE REPORTED IMMEDIATELY: *FEVER GREATER THAN 100.4 F (38 C) OR HIGHER *CHILLS OR SWEATING *NAUSEA AND VOMITING THAT IS NOT CONTROLLED WITH YOUR NAUSEA MEDICATION *UNUSUAL SHORTNESS OF BREATH *UNUSUAL BRUISING OR BLEEDING *URINARY PROBLEMS (pain or burning when urinating, or frequent urination) *BOWEL PROBLEMS (unusual diarrhea, constipation, pain near the anus) TENDERNESS IN MOUTH AND THROAT WITH OR WITHOUT PRESENCE OF ULCERS (sore throat, sores in mouth, or a toothache) UNUSUAL RASH, SWELLING OR PAIN  UNUSUAL VAGINAL DISCHARGE OR ITCHING   Items with * indicate a potential emergency and should be followed up as soon as possible or go to the Emergency Department if any problems should occur.  Please show the CHEMOTHERAPY ALERT CARD or IMMUNOTHERAPY  ALERT CARD at check-in to the Emergency Department and triage nurse.  Should you have questions after your visit or need to cancel or reschedule your appointment, please contact [facility]  Dept: [phone]  and follow the prompts.  Office hours are 8:00 a.m. to 4:30 p.m. Monday - Friday. Please note that voicemails left after 4:00 p.m. may not be returned until the following business day.  We are closed weekends and major holidays. You have access to a nurse at all times for urgent questions. Please call the main number to the clinic Dept: [phone] and follow the prompts.   For any non-urgent questions, you may also contact your provider using MyChart. We now offer e-Visits for anyone 28 and older to request care online for non-urgent symptoms. For details visit [email or web address].   Also download the MyChart app! Go to the app store, search "MyChart", open the app, select [facility], and log in with your MyChart username and password.

## 2022-09-11 NOTE — Assessment & Plan Note (Signed)
-  S/p C2D8 Taxol she developed mild tingling in her hands and feet. Taxol stopped after 12/18/20.  -worsening with chemo

## 2022-09-11 NOTE — Assessment & Plan Note (Signed)
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 S/p palliative radiation to right iliac 09/04/20 - 09/22/20 with Dr. Moody. -Given localized bone met, she started Zometa q3months on 11/20/20 

## 2022-09-11 NOTE — Assessment & Plan Note (Addendum)
-  received multiple palliative RT -on oxycodone and garbapentin -f/u with palliative care clinic

## 2022-09-11 NOTE — Progress Notes (Deleted)
Patient seen by {Blank single:19197::"NP today","MD today"}  Vitals are {Blank single:19197::"not all within treatment parameters. ***","within treatment parameters."}  Labs reviewed: {Blank single:19197::","and are not all within treatment parameters. ***","and are within treatment parameters.""}  Per physician team, {Blank single:19197::"patient is ready for treatment. Please note that modifications are being made to the treatment plan including ***","patient will not be receiving treatment today.","patient is ready for treatment and there are NO modifications to the treatment plan."}

## 2022-09-12 ENCOUNTER — Telehealth: Payer: Self-pay

## 2022-09-12 DIAGNOSIS — C50512 Malignant neoplasm of lower-outer quadrant of left female breast: Secondary | ICD-10-CM

## 2022-09-12 DIAGNOSIS — C7951 Secondary malignant neoplasm of bone: Secondary | ICD-10-CM

## 2022-09-12 LAB — CANCER ANTIGEN 27.29: CA 27.29: 339.5 U/mL — ABNORMAL HIGH (ref 0.0–38.6)

## 2022-09-12 MED ORDER — GABAPENTIN 300 MG PO CAPS: ORAL_CAPSULE | ORAL | 1 refills | Status: DC

## 2022-09-12 NOTE — Telephone Encounter (Signed)
Kristin Ward states that she is doing fine.  She is eating drinking, and urinating well. She knows to call the office at [phone] if she has any questions or concerns.

## 2022-09-12 NOTE — Telephone Encounter (Signed)
-----  Message from Willis Modena, RN sent at 09/11/2022  3:53 PM EST ----- Regarding: Dr. Burr Medico 1st time Keytruda/Gemzar/Carbo f/u tol well Dr. Burr Medico 1st time Keytruda/Gemzar/Carbo Pt call back due. Pt tolerated tx well without incident.

## 2022-09-12 NOTE — Addendum Note (Signed)
Addended by: [name] on: 09/12/2022 10:31 AM   Modules accepted: Orders

## 2022-09-16 ENCOUNTER — Ambulatory Visit: Payer: 59 | Attending: Nurse Practitioner

## 2022-09-16 ENCOUNTER — Other Ambulatory Visit: Payer: Self-pay | Admitting: Hematology

## 2022-09-16 DIAGNOSIS — D61810 Antineoplastic chemotherapy induced pancytopenia: Secondary | ICD-10-CM | POA: Diagnosis not present

## 2022-09-16 DIAGNOSIS — M6281 Muscle weakness (generalized): Secondary | ICD-10-CM

## 2022-09-16 DIAGNOSIS — N1330 Unspecified hydronephrosis: Secondary | ICD-10-CM | POA: Diagnosis not present

## 2022-09-16 DIAGNOSIS — C7951 Secondary malignant neoplasm of bone: Secondary | ICD-10-CM | POA: Diagnosis not present

## 2022-09-16 DIAGNOSIS — R911 Solitary pulmonary nodule: Secondary | ICD-10-CM | POA: Diagnosis not present

## 2022-09-16 DIAGNOSIS — I82423 Acute embolism and thrombosis of iliac vein, bilateral: Secondary | ICD-10-CM | POA: Diagnosis not present

## 2022-09-16 DIAGNOSIS — I871 Compression of vein: Secondary | ICD-10-CM | POA: Diagnosis not present

## 2022-09-16 DIAGNOSIS — I890 Lymphedema, not elsewhere classified: Secondary | ICD-10-CM | POA: Diagnosis not present

## 2022-09-16 DIAGNOSIS — E782 Mixed hyperlipidemia: Secondary | ICD-10-CM | POA: Diagnosis not present

## 2022-09-16 DIAGNOSIS — H547 Unspecified visual loss: Secondary | ICD-10-CM | POA: Diagnosis not present

## 2022-09-16 DIAGNOSIS — G620 Drug-induced polyneuropathy: Secondary | ICD-10-CM

## 2022-09-16 DIAGNOSIS — I77810 Thoracic aortic ectasia: Secondary | ICD-10-CM | POA: Diagnosis not present

## 2022-09-16 DIAGNOSIS — E119 Type 2 diabetes mellitus without complications: Secondary | ICD-10-CM | POA: Diagnosis not present

## 2022-09-16 DIAGNOSIS — R262 Difficulty in walking, not elsewhere classified: Secondary | ICD-10-CM

## 2022-09-16 DIAGNOSIS — K219 Gastro-esophageal reflux disease without esophagitis: Secondary | ICD-10-CM | POA: Diagnosis not present

## 2022-09-16 DIAGNOSIS — C50512 Malignant neoplasm of lower-outer quadrant of left female breast: Secondary | ICD-10-CM | POA: Diagnosis not present

## 2022-09-16 DIAGNOSIS — G893 Neoplasm related pain (acute) (chronic): Secondary | ICD-10-CM | POA: Diagnosis not present

## 2022-09-16 DIAGNOSIS — I420 Dilated cardiomyopathy: Secondary | ICD-10-CM | POA: Diagnosis not present

## 2022-09-16 DIAGNOSIS — I2510 Atherosclerotic heart disease of native coronary artery without angina pectoris: Secondary | ICD-10-CM | POA: Diagnosis not present

## 2022-09-16 DIAGNOSIS — J449 Chronic obstructive pulmonary disease, unspecified: Secondary | ICD-10-CM | POA: Diagnosis not present

## 2022-09-16 DIAGNOSIS — N179 Acute kidney failure, unspecified: Secondary | ICD-10-CM | POA: Diagnosis not present

## 2022-09-16 DIAGNOSIS — Z794 Long term (current) use of insulin: Secondary | ICD-10-CM | POA: Diagnosis not present

## 2022-09-16 DIAGNOSIS — R293 Abnormal posture: Secondary | ICD-10-CM

## 2022-09-16 DIAGNOSIS — K5900 Constipation, unspecified: Secondary | ICD-10-CM | POA: Diagnosis not present

## 2022-09-16 DIAGNOSIS — I110 Hypertensive heart disease with heart failure: Secondary | ICD-10-CM | POA: Diagnosis not present

## 2022-09-16 DIAGNOSIS — I5042 Chronic combined systolic (congestive) and diastolic (congestive) heart failure: Secondary | ICD-10-CM | POA: Diagnosis not present

## 2022-09-16 NOTE — Therapy (Signed)
OUTPATIENT PHYSICAL THERAPY ONCOLOGY TREATMENT  Patient Name: Kristin Ward MRN: [id number] DOB:19-Sep-1955, 67 y.o., female Today's Date: 09/16/2022  END OF SESSION:  PT End of Session - 09/16/22 1201     Visit Number 5    Number of Visits 24    Date for PT Re-Evaluation 10/24/22    PT Start Time 1201    PT Stop Time 1255    PT Time Calculation (min) 54 min    Activity Tolerance Patient tolerated treatment well;Other (comment)   needed position modifications due to pain   Behavior During Therapy [facility] for tasks assessed/performed              Past Medical History:  Diagnosis Date   Abnormal x-ray of lungs with single pulmonary nodule 03/15/2016   per records from [place]    Anemia    Asthma    Cholelithiasis 05/19/2017   On CT   Coronary artery calcification seen on CAT scan 05/19/2017   Diabetes mellitus without complication ([place])    type 2   Diabetes mellitus, new onset ([place]) 01/20/2019   Dilated idiopathic cardiomyopathy ([place]) 12/2015   EF 15-20%. Diagnosed in [facility]   Headache    NONE RECENT   Hypertension    Lymph nodes enlarged 07/25/2020   Malignant neoplasm of lower-outer quadrant of left breast of female, estrogen receptor negative ([place]) 06/11/2017   left breast   Mixed hyperlipidemia 06/17/2018   partially blind    Personal history of chemotherapy 2018-2019   Personal history of radiation therapy 2019   Retinitis pigmentosa    Retroperitoneal lymphadenopathy 07/25/2020   Uveitis    Past Surgical History:  Procedure Laterality Date   brain cyst removed  2007   to help with headaches per patient , aspirated    BREAST BIOPSY Left 06/06/2017   x2   BREAST CYST ASPIRATION Left 06/06/2017   BREAST LUMPECTOMY Left 06/26/2017   BREAST LUMPECTOMY WITH RADIOACTIVE SEED AND SENTINEL LYMPH NODE BIOPSY Left 06/26/2017   Procedure: BREAST LUMPECTOMY WITH RADIOACTIVE SEED AND SENTINEL LYMPH NODE BIOPSY ERAS  PATHWAY;  Surgeon: [name], MD;  Location: [place];  Service: General;  Laterality: Left;  pec block   FINE NEEDLE ASPIRATION Left 02/23/2019   Procedure: FINE NEEDLE ASPIRATION;  Surgeon: [name], MD;  Location: [place];  Service: General;  Laterality: Left;  Asiration of left breast seroma    IR IMAGING GUIDED PORT INSERTION  10/13/2020   NASAL TURBINATE REDUCTION     PORT-A-CATH REMOVAL Right 02/23/2019   Procedure: REMOVAL PORT-A-CATH;  Surgeon: [name], MD;  Location: [place];  Service: General;  Laterality: Right;   PORTACATH PLACEMENT N/A 06/26/2017   Procedure: INSERTION PORT-A-CATH;  Surgeon: [name], MD;  Location: [place];  Service: General;  Laterality: N/A;   RIGHT/LEFT HEART CATH AND CORONARY ANGIOGRAPHY N/A 07/31/2022   Procedure: RIGHT/LEFT HEART CATH AND CORONARY ANGIOGRAPHY;  Surgeon: Jolaine Artist, MD;  Location: [place] CV [facility];  Service: Cardiovascular;  Laterality: N/A;   TRANSTHORACIC ECHOCARDIOGRAM  12/2015   A) [facility] May 2017: Mild concentric LVH. Global hypokinesis. GR daily. EF 18% severe LA dilation. Mitral annular dilatation with papillary muscle dysfunction and moderate MR. Dilated IVC consistent with elevated RAP.   TRANSTHORACIC ECHOCARDIOGRAM  05/2017; 08/2017   a) Mild concentric LVH.  EF 45 to 50% with diffuse hypokinesis.  GR 1 DD.  Mild aortic root dilation.;; b)  Mild LVH EF 45 to  50%.  Diffuse HK.  No significant valve disease.  No significant change.   TRANSTHORACIC ECHOCARDIOGRAM  01/30/2022   Pre-[place]:EF 35 to 40%.  Moderate dysfunction with global HK.  Abnormal strain.  Normal RV size and function.  Normal RVSP.  Aortic root estimated 42 mm.  Mild MR.  Mild/trivial AI.  Normal RVP.;  04/01/2022: Preliminary read 40%.  Formal read 45 to 50% (probably 40 to 45%).  Global HK.  GR 1 DD.  Normal RV size and RVP.  Mild MR.  Now measured at 38 mm.  Normal RAP.   Patient Active Problem List   Diagnosis Date  Noted   Peripheral neuropathy due to chemotherapy ([place]) 07/26/2022   Cancer related pain 07/26/2022   Swelling 04/16/2022   Constipation 02/21/2022   Hypertension associated with diabetes ([place]) 11/26/2021   Class 1 obesity due to excess calories with serious comorbidity and body mass index (BMI) of 33.0 to 33.9 in adult 11/26/2021   Pain due to onychomycosis of toenails of both feet 11/21/2021   Neuropathy associated with cancer ([place]) 11/21/2021   History of chemotherapy 09/14/2021   Uncontrolled type 2 diabetes mellitus with hyperglycemia ([place]) 03/08/2021   Neuropathy 01/12/2021   Situational depression 01/12/2021   Goals of care, counseling/discussion 09/15/2020   Malignant neoplasm metastatic to bone ([place]) 08/30/2020   Pain from bone metastases ([place]) 08/30/2020   Right hip pain 08/22/2020   Lymph nodes enlarged 07/25/2020   Retroperitoneal lymphadenopathy 07/25/2020   History of breast cancer 05/11/2020   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   Chronic right hip pain 04/05/2020   Vitamin D deficiency 10/07/2019   Elevated serum creatinine 10/07/2019   Mixed hyperlipidemia 06/17/2018   Influenza vaccination declined 06/17/2018   Obesity (BMI 30-39.9) 06/17/2018   Port-A-Cath in place 09/08/2017   Other fatigue 08/29/2017   Anemia 08/29/2017   High risk medication use 08/29/2017   Syncope 08/25/2017   Blind in both eyes 08/25/2017   Genetic testing 06/19/2017   Family history of colon cancer    Malignant neoplasm of lower-outer quadrant of left breast of female, estrogen receptor negative ([place]) 06/11/2017   Cholelithiasis 05/19/2017   Coronary artery calcification seen on CAT scan 05/19/2017   Low grade squamous intraepith lesion on cytologic smear cervix ([place]) 04/09/2017   HPV (human papilloma virus) infection 04/09/2017   COPD (chronic obstructive pulmonary disease) ([place])    Essential hypertension 03/20/2017   Screen for colon cancer 03/20/2017   Retinitis  pigmentosa    Blind    Solitary pulmonary nodule on lung CT 03/16/2016   Abnormal myocardial perfusion study 03/15/2016   Chronic combined systolic and diastolic heart failure ([place]) 12/28/2015   Dilated cardiomyopathy ([place]) 12/27/2015    PCP: Dr Jill Alexanders  REFERRING PROVIDER: Cira Rue, NP  REFERRING DIAG: Chemo induced peripheral neuropathy  THERAPY DIAG:  Chemotherapy-induced neuropathy (HCC)  Abnormal posture  Muscle weakness (generalized)  Lymphedema, not elsewhere classified  Difficulty in walking, not elsewhere classified  ONSET DATE: 2019 with exacerbation  Rationale for Evaluation and Treatment: Rehabilitation  SUBJECTIVE:  SUBJECTIVE STATEMENT: I saw the Dr and told them about all the pain I was having. They increased my neurontin and I haven't had too much trouble since then. I can wear The TG soft now but the top rolls down all the time. Pt is wearing a cast shoe on the right because her foot is too large for a shoe. She is not wearing the TG soft because it doesn't stay up. Pt arrived an hour early for appt and was noticed being very uncomfortable sitting in a chair in the gym. Therapist brought her back to an empty room and allowed her to lie on the table, but left the door open so she could be heard if she was having any problems. Pt has been performing self MLD at home and notes she always has to urinate afterwards. When sitting in chair her leg pain was 8/10, but reduced to minimal with leg elevated.  PERTINENT HISTORY:  Pt had a left lumpectomy with SLNB on 06/26/2017 for triple negative cancer with 0/3 LN's. She had adjuvant chemotherapy for 4 cycles followed by radiation. She had subsequent bone metastasis and had palliative radiation to the right iliac region in Jan-Feb  2022. She also had chemotherapy with Taxol in Feb 2022 stopped after the 4th cycle due to neuropathy and was switched to Xeloda. In November of 2022 she was noted to have disease progression with increased number and size of enlarged LN's in retroperitoneum, pelvic sidewall and upper thigh.. She progressed [place] and most recently [place] ending 07/05/2022. She developed moderate neuropathy and it was stopped. She had a great response but now wants to try 1 month without treatment.. Negative doppler for DVT regarding swelling in Right LE  PAIN:  Are you having pain? Yes NPRS scale: 8/10 Right LE with  leg down, 0/10 with leg elevated Pain location: right leg Pain orientation: Right  PAIN TYPE: aching and numb Pain description: constant  Aggravating factors: sitting gets more swollen, standing, walking Relieving factors: laying down with leg elevated  PRECAUTIONS: chronic systolic and diastolic heart failure EF 45-50%, right LE swelling, DM, hypertension, COPD,retinitis pigmentosa (limits vision legally blind) left UE lymphedema risk, bone metastasis  WEIGHT BEARING RESTRICTIONS: No  FALLS:  Has patient fallen in last 6 months? No  LIVING ENVIRONMENT: Lives with: lives with their family, lives with their son, and lives with their daughter Lives in: House/apartment Stairs: Yes; External: 4 steps; can reach both Has following equipment at home: Single point cane and shower chair, blind cane  OCCUPATION: retired Marine scientist  LEISURE: bowling, crafts,puzzles  HAND DOMINANCE: right   PRIOR LEVEL OF FUNCTION: Independent with household mobility with device  PATIENT GOALS: be more active, get swelling in right LE down,improve balance/gait   OBJECTIVE:  COGNITION: Overall cognitive status: Within functional limits for tasks assessed   PALPATION: Moderate pitting edema right lower leg, less so in upper leg  OBSERVATIONS / OTHER ASSESSMENTS: Significant right LE swelling  foot to groin limiting mobility, ambulation, standing and sitting  SENSATION: Light touch: Deficits pt has sensation but it is decreased bilateral LE's   POSTURE: forward head, rounded shoulders  LOWER EXTREMITY AROM/PROM:  A/PROM Right eval  Hip flexion   Hip extension   Hip abduction   Hip adduction   Hip internal rotation   Hip external rotation   Knee flexion   Knee extension   Ankle dorsiflexion   Ankle plantarflexion   Ankle inversion   Ankle eversion    (  Blank rows = not tested)  A/PROM LEFT eval  Hip flexion   Hip extension   Hip abduction   Hip adduction   Hip internal rotation   Hip external rotation   Knee flexion   Knee extension   Ankle dorsiflexion   Ankle plantarflexion   Ankle inversion   Ankle eversion    (Blank rows = not tested)  LOWER EXTREMITY MMT:   LYMPHEDEMA ASSESSMENTS:  SURGERY TYPE/DATE: 06/26/2017 Left lumpectomy with SLNB for Triple Negative cancer NUMBER OF LYMPH NODES REMOVED: 0/3 CHEMOTHERAPY: Yes, multiple for breast cancer and metastasis RADIATION:YES for breast and iliac regions due to metastasis HORMONE TREATMENT: NO INFECTIONS: NO  LYMPHEDEMA ASSESSMENTS:        LOWER EXTREMITY LANDMARK RIGHT eval 09/06/22 09/16/2022  At groin 83 86 88  30 cm proximal to suprapatella     20 cm proximal to suprapatella 77 78.5   10 cm proximal to suprapatella 65.5 62   At midpatella / popliteal crease 57.5 57.5 54  30 cm proximal to floor at lateral plantar foot 43.5 43.8 47.2  20 cm proximal to floor at lateral plantar foot 32.8 32.7 34.1  10 cm proximal to floor at lateral plantar foot 27.3    Circumference of ankle/heel     5 cm proximal to 1st MTP joint     Across MTP joint 24    Around proximal great toe 8.5    (Blank rows = not tested)  LOWER EXTREMITY LANDMARK LEFT eval  At groin 67  30 cm proximal to suprapatella   20 cm proximal to suprapatella 63.8  10 cm proximal to suprapatella 52.5  At midpatella / popliteal  crease 42.2  30 cm proximal to floor at lateral plantar foot 35.5  20 cm proximal to floor at lateral plantar foot 16.0  10 cm proximal to floor at lateral plantar foot 14.1  Circumference of ankle/heel   5 cm proximal to 1st MTP joint   Across MTP joint 22.5  Around proximal great toe 7.9  (Blank rows = not tested)  FUNCTIONAL TESTS:  30 sec sit to stand  9 reps using arms on chair TUG: 16 sec, 18 sec with use of arms and cane Balance: DLS able to maintain 10 sec, half tandem left forward 7 seconds, right forward 10 sec  GAIT: Distance walked: 40 feet Assistive device utilized: Single point canehandle backwards and using on right side Level of assistance: Complete Independence Comments: lurching type gait, excessive lateral motion decreased heel strike, toe off,   TODAY'S TREATMENT:                                                                                                                                         DATE:  09/16/2022 Pt remeasured in several places with increased edema noted throughout. Leg remains very limiting pts mobility and very painful when sitting with her leg  in a dependent position. Setup appt at 3:30 for pt to be measured for Circaid reduction kit with Primitivo Gauze on Wednesday.  MLD: Short neck, 5 diaphragmatic breaths , Rt axillary nodes, Rt inguino-axillary anastomosis, then worked on Rt upper leg on lateral thigh, medial to lateral and then lateral again, knee, lower leg on anterior and posterior surfaces then ended at ankle and then retraced all steps to back to anastomosis and axillary LN's; pt had to shift positions multiple times to get her leg comfortable. She did have to use the rest room immediately after and noted her leg felt better after treatment.   09/06/22: Pt arrives with increased pain 9/10 and difficulty even walking back to the room.  She reports that she feels increased burning and pain in the Rt thigh which started around 2 hours after last  treatment.   Remeasured LE which is around 3cm larger in the upper thigh Discussed with evaluating PT about POC and response to therapy and discussed with pt that we should hold on therapy due to response to treatment and that she may feel better and tolerate things better a few weeks into chemo if there is a space occupying lesion/LN mass.   Discussed use of tg soft (not unless it helps) and focusing on ADLs for exercise for now.   09/04/22: Manual Therapy MLD: Short neck, 5 diaphragmatic breaths (pt with improved technique today), Rt axillary nodes, Rt inguino-axillary anastomosis, then worked on Rt upper leg on lateral thigh, medial to lateral and then lateral again, knee, lower leg on anterior and posterior surfaces then ended at ankle/foot toes and then retraced all steps to back to anastomosis and axillary LN's; continued instructing pt and having her return demo with hand over hand technique After MLD applied cocoa butter to Rt LE and 2 TG soft stockinettes from last session: large to thigh and medium to lower leg but no compression bandages today as pt did not tolerate bandages from last session  09/02/22: Manual Therapy MLD: Began instructing pt and daughter in basics of lymphatic systema nd principles of MLD while performing following: Short neck, 5 diaphragmatic breaths (cuing for technique here), Lt inguinal and Rt axillary nodes, anterior inter-inguinal and Lt inguino-axillary anastomosis, then worked on Rt upper leg on lateral thigh, medial to latearl and then lateral again and retraced all steps to anastomosis; began instructing pt in this having her return demo Compression Bandaging: Cocoa butter applied to entire Rt LE: TG soft size large to thigh, and then TG soft size med from foot to knee then bandages as follows: Elastomull to toes 1-4, artiflex x1 from foot to knee, and then 1-6 cm to foot ankle (combined Roman sandal and ASH technique with this one bandage to start light pressure)  and 1-10 cm from ankle to knee. Pt had some discomfort with donning TG soft on her thigh but once this was in place she reports it feeling much better on her leg. Showed daughter how to be mindful of keeping top band folded over so as to prevent TG soft from rolling down on her leg, especially at posterior thigh. Pt reports bandages feeling comfortable by end of session and was educated in remedial exercises to perform if she notices any increased tingling or pain that isn't from her CIPN. Then if any possible symptoms don't improve to remove them and bring them back.   08/29/2022  PATIENT EDUCATION:  Education details: Remedial exs with compression bandages on ; self MLD Person educated: Patient  and daughter Education method: Explanation, Demonstration Education comprehension: verbalized understanding, returned demo, will benefit from further review  HOME EXERCISE PROGRAM: 09/02/22: Remedial exs and to doff bandages if she has any increased pain/tingling that isn't alleviated by exs; self MLD 1/18/2024None given at eval  ASSESSMENT:  CLINICAL IMPRESSION:  Pt with increased edema throughout right LE when compared to previous measurements. Leg continues to be extremely swollen but without lymphatic leakage . Pt has significant difficulty walking due to the size of her thigh. She is anxious to try Velcro wraps as something she can wear for several hours at a time in an attempt to keep swelling down. She did feel better after the MLD and she is compliant with MLD at home.  OBJECTIVE IMPAIRMENTS: Abnormal gait, decreased activity tolerance, decreased balance, decreased endurance, decreased knowledge of condition, difficulty walking, decreased strength, increased edema, increased muscle spasms, and pain.   ACTIVITY LIMITATIONS: carrying, lifting, sitting, standing, squatting, sleeping, bed mobility, dressing, locomotion level, and normal household chores  PARTICIPATION LIMITATIONS: meal prep,  laundry, driving, community activity, and normal household chores  PERSONAL FACTORS: 3+ comorbidities: Left lumpectomy s/p chemo and radiation, bone and LN metastasis, legally blind, DM, CHF ,COPD are also affecting patient's functional outcome.   REHAB POTENTIAL: Good  CLINICAL DECISION MAKING: Evolving/moderate complexity  EVALUATION COMPLEXITY: Moderate  GOALS: Goals reviewed with patient? Yes  SHORT TERM GOALS: Target date: 09/26/2022  Pt/pts children will be independent in compression bandaging to the Right LE Baseline: Goal status: INITIAL  2.  Pt will be tolerant of compression bandaging and will have reduction in Right LE swelling without any deleterious effects IE: SOB Baseline:  Goal status: INITIAL  3. Pt will have reduction in swelling at 20 cm above suprapatella by 4 cm  Baseline approx 77 cm Goal Status: INITIAL  4. Pt will have decreased edema at 10 cm prox to floor by4 cm Baseline 12 cm Goal status: initial  LONG TERM GOALS: Target date: 10/23/2022  Pt will have edema reduction at 20 cm prox to suprapatella by 7 cm for easier mobility Baseline:  Goal status: INITIAL  2.  Pt will have edema reduction at 10 cm prox to floor by 6 cm or more to improve mobility Baseline:  Goal status: INITIAL  3.  Pt will be able to perform 14 sit to stands in 30 seconds to decrease fall risk Baseline:  Goal status: INITIAL  4.  Pt will perform TUG in 12 seconds or less to decrease fall risk Baseline:  Goal status: INITIAL  5. Pt will be fit for compression garment to hold right LE swelling  Goal status:INITIAL  PLAN:  PT FREQUENCY: 3x/week  PT DURATION: 8 weeks  PLANNED INTERVENTIONS: Therapeutic exercises, Neuromuscular re-education, Balance training, Gait training, Patient/Family education, Self Care, Orthotic/Fit training, Manual lymph drainage, Compression bandaging, Vasopneumatic device, Manual therapy, and Re-evaluation  PLAN FOR NEXT SESSION: Reassess upon  return, MLD to right LE, instruct and issue initial HEP: standing bil LE hip 3 way raises then later start NM, strength, gait exs, assess MMT/ROM prn   Claris Pong, PT 09/16/2022, 1:08 PM  Start with circles near neck, 5 times each side   Cancer Rehab [phone] Deep Effective Breath   Standing, sitting, or laying down place both hands on the belly. Take a deep breath IN, expanding the belly; then breath OUT, contracting the belly. Repeat __5__ times. Do __2-3__ sessions per day and before each self massage.  Inguinal Nodes to Axilla - Clear  On involved side, at armpit, make _5__ in-place circles. Then from hip proceed in sections to armpit with stationary circles or pumps _5_ times, this is your pathway. Do _1__ time per day.  Copyright  VHI. All rights reserved.  LEG: Knee to Hip - Clear   Pump up outer thigh of involved leg from knee to outer hip. Then do stationary circles from inner to outer thigh, then do outer thigh again. Next, interlace fingers behind knee IF ABLE and make in-place circles. Do _5_ times of each sequence.  Do _1__ time per day.  Copyright  VHI. All rights reserved.  LEG: Ankle to Hip Sweep   Hands on sides of ankle of involved leg, pump _5__ times up both sides of lower leg, then retrace steps up outer thigh to hip as before and back to pathway. Do _2-3_ times. Do __1_ time per day.  Copyright  VHI. All rights reserved.  FOOT: Dorsum of Foot and Toes Massage   One hand on top of foot make _5_ stationary circles or pumps, then either on top of toes or each individual toe do _5_ pumps. Then retrace all steps pumping back up both sides of lower leg, outer thigh, and then pathway. Finish with what you started with, _5_ circles at involved side arm pit. All _2-3_ times at each sequence. Do _1__ time per day.

## 2022-09-17 ENCOUNTER — Other Ambulatory Visit: Payer: Self-pay

## 2022-09-17 ENCOUNTER — Encounter: Payer: 59 | Admitting: Physical Therapy

## 2022-09-17 ENCOUNTER — Telehealth: Payer: Self-pay | Admitting: Hematology

## 2022-09-17 DIAGNOSIS — C50512 Malignant neoplasm of lower-outer quadrant of left female breast: Secondary | ICD-10-CM

## 2022-09-17 DIAGNOSIS — C7951 Secondary malignant neoplasm of bone: Secondary | ICD-10-CM

## 2022-09-17 MED FILL — Dexamethasone Sodium Phosphate Inj 100 MG/10ML: INTRAMUSCULAR | Qty: 1 | Status: AC

## 2022-09-17 NOTE — Assessment & Plan Note (Signed)
-  received multiple palliative RT -on oxycodone and garbapentin -f/u with palliative care clinic

## 2022-09-17 NOTE — Telephone Encounter (Signed)
Contacted patient to scheduled appointments. Patient is aware of appointments that are scheduled.   

## 2022-09-17 NOTE — Assessment & Plan Note (Signed)
stage IB, p(T1cN0M0), Triple negative, Grade 3, in 2018, metastatic disease in 08/2020 ER 20% weakly + PR/HER2 (0) negative, PD-L1 0%, genetics (-), HER2 1+ on node biopsy  -diagnosed with triple negative breast cancer in 05/2017. S/p left breast lumpectomy, adjuvant chemo TC and Radiation.  -biopsy of right gluteus on 09/02/20 due to new pain showed metastatic carcinoma, consistent with breast primary. ER 20% weakly positive, PR and HER2 negative.  -s/p multiple line chemo, including taxol, xeloda, Trodelvy, enhertu -she was on [place], but developed worsening neuropathy and stopped after 07/05/22 and she requested a chemo break  -she started carboplatin and gemcitabine, plus Keytruda last week, tolerated well, will continue

## 2022-09-17 NOTE — Assessment & Plan Note (Signed)
-  S/p C2D8 Taxol she developed mild tingling in her hands and feet. Taxol stopped after 12/18/20.  -worsening with chemo

## 2022-09-17 NOTE — Assessment & Plan Note (Signed)
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 S/p palliative radiation to right iliac 09/04/20 - 09/22/20 with Dr. Moody. -Given localized bone met, she started Zometa q3months on 11/20/20 

## 2022-09-18 ENCOUNTER — Encounter: Payer: Self-pay | Admitting: Nurse Practitioner

## 2022-09-18 ENCOUNTER — Encounter: Payer: Self-pay | Admitting: Hematology

## 2022-09-18 ENCOUNTER — Other Ambulatory Visit: Payer: Self-pay | Admitting: Hematology and Oncology

## 2022-09-18 ENCOUNTER — Inpatient Hospital Stay (HOSPITAL_COMMUNITY)
Admission: AD | Admit: 2022-09-18 | Discharge: 2022-09-26 | DRG: 252 | Disposition: A | Discharge status: Home / Self Care | Payer: 59 | Source: Ambulatory Visit | Attending: Internal Medicine | Admitting: Internal Medicine

## 2022-09-18 ENCOUNTER — Other Ambulatory Visit: Payer: Self-pay

## 2022-09-18 ENCOUNTER — Inpatient Hospital Stay (HOSPITAL_BASED_OUTPATIENT_CLINIC_OR_DEPARTMENT_OTHER): Payer: 59 | Admitting: Hematology

## 2022-09-18 ENCOUNTER — Inpatient Hospital Stay (HOSPITAL_BASED_OUTPATIENT_CLINIC_OR_DEPARTMENT_OTHER): Payer: 59 | Admitting: Nurse Practitioner

## 2022-09-18 ENCOUNTER — Inpatient Hospital Stay (HOSPITAL_COMMUNITY): Payer: 59

## 2022-09-18 ENCOUNTER — Inpatient Hospital Stay: Payer: 59

## 2022-09-18 ENCOUNTER — Encounter (HOSPITAL_COMMUNITY): Payer: Self-pay

## 2022-09-18 ENCOUNTER — Encounter (HOSPITAL_COMMUNITY): Payer: Self-pay | Admitting: Internal Medicine

## 2022-09-18 ENCOUNTER — Inpatient Hospital Stay: Payer: 59 | Attending: Hematology

## 2022-09-18 VITALS — BP 110/78 | HR 93 | Temp 98.7°F | Resp 18

## 2022-09-18 DIAGNOSIS — T451X5A Adverse effect of antineoplastic and immunosuppressive drugs, initial encounter: Secondary | ICD-10-CM

## 2022-09-18 DIAGNOSIS — R911 Solitary pulmonary nodule: Secondary | ICD-10-CM | POA: Diagnosis present

## 2022-09-18 DIAGNOSIS — C50512 Malignant neoplasm of lower-outer quadrant of left female breast: Secondary | ICD-10-CM

## 2022-09-18 DIAGNOSIS — Z7189 Other specified counseling: Secondary | ICD-10-CM | POA: Diagnosis not present

## 2022-09-18 DIAGNOSIS — E782 Mixed hyperlipidemia: Secondary | ICD-10-CM | POA: Diagnosis present

## 2022-09-18 DIAGNOSIS — Z880 Allergy status to penicillin: Secondary | ICD-10-CM

## 2022-09-18 DIAGNOSIS — K219 Gastro-esophageal reflux disease without esophagitis: Secondary | ICD-10-CM | POA: Diagnosis present

## 2022-09-18 DIAGNOSIS — Z853 Personal history of malignant neoplasm of breast: Secondary | ICD-10-CM

## 2022-09-18 DIAGNOSIS — Z171 Estrogen receptor negative status [ER-]: Secondary | ICD-10-CM

## 2022-09-18 DIAGNOSIS — G620 Drug-induced polyneuropathy: Secondary | ICD-10-CM | POA: Diagnosis not present

## 2022-09-18 DIAGNOSIS — I890 Lymphedema, not elsewhere classified: Secondary | ICD-10-CM | POA: Diagnosis present

## 2022-09-18 DIAGNOSIS — Z888 Allergy status to other drugs, medicaments and biological substances status: Secondary | ICD-10-CM

## 2022-09-18 DIAGNOSIS — Z885 Allergy status to narcotic agent status: Secondary | ICD-10-CM

## 2022-09-18 DIAGNOSIS — R609 Edema, unspecified: Secondary | ICD-10-CM | POA: Diagnosis not present

## 2022-09-18 DIAGNOSIS — Z923 Personal history of irradiation: Secondary | ICD-10-CM

## 2022-09-18 DIAGNOSIS — R2241 Localized swelling, mass and lump, right lower limb: Secondary | ICD-10-CM | POA: Diagnosis not present

## 2022-09-18 DIAGNOSIS — Z515 Encounter for palliative care: Secondary | ICD-10-CM

## 2022-09-18 DIAGNOSIS — Z79899 Other long term (current) drug therapy: Secondary | ICD-10-CM

## 2022-09-18 DIAGNOSIS — R600 Localized edema: Secondary | ICD-10-CM | POA: Diagnosis present

## 2022-09-18 DIAGNOSIS — E785 Hyperlipidemia, unspecified: Secondary | ICD-10-CM | POA: Diagnosis present

## 2022-09-18 DIAGNOSIS — D649 Anemia, unspecified: Secondary | ICD-10-CM | POA: Diagnosis present

## 2022-09-18 DIAGNOSIS — N179 Acute kidney failure, unspecified: Secondary | ICD-10-CM | POA: Diagnosis present

## 2022-09-18 DIAGNOSIS — J449 Chronic obstructive pulmonary disease, unspecified: Secondary | ICD-10-CM | POA: Diagnosis present

## 2022-09-18 DIAGNOSIS — Z538 Procedure and treatment not carried out for other reasons: Secondary | ICD-10-CM | POA: Diagnosis not present

## 2022-09-18 DIAGNOSIS — I2510 Atherosclerotic heart disease of native coronary artery without angina pectoris: Secondary | ICD-10-CM | POA: Diagnosis present

## 2022-09-18 DIAGNOSIS — D72819 Decreased white blood cell count, unspecified: Secondary | ICD-10-CM | POA: Diagnosis present

## 2022-09-18 DIAGNOSIS — I10 Essential (primary) hypertension: Secondary | ICD-10-CM | POA: Diagnosis present

## 2022-09-18 DIAGNOSIS — R530 Neoplastic (malignant) related fatigue: Secondary | ICD-10-CM

## 2022-09-18 DIAGNOSIS — E119 Type 2 diabetes mellitus without complications: Secondary | ICD-10-CM | POA: Diagnosis present

## 2022-09-18 DIAGNOSIS — R110 Nausea: Secondary | ICD-10-CM

## 2022-09-18 DIAGNOSIS — G893 Neoplasm related pain (acute) (chronic): Secondary | ICD-10-CM

## 2022-09-18 DIAGNOSIS — I745 Embolism and thrombosis of iliac artery: Secondary | ICD-10-CM | POA: Diagnosis not present

## 2022-09-18 DIAGNOSIS — K5900 Constipation, unspecified: Secondary | ICD-10-CM | POA: Diagnosis present

## 2022-09-18 DIAGNOSIS — Z7984 Long term (current) use of oral hypoglycemic drugs: Secondary | ICD-10-CM

## 2022-09-18 DIAGNOSIS — C7951 Secondary malignant neoplasm of bone: Secondary | ICD-10-CM

## 2022-09-18 DIAGNOSIS — I110 Hypertensive heart disease with heart failure: Secondary | ICD-10-CM | POA: Diagnosis present

## 2022-09-18 DIAGNOSIS — E669 Obesity, unspecified: Secondary | ICD-10-CM | POA: Diagnosis present

## 2022-09-18 DIAGNOSIS — Z794 Long term (current) use of insulin: Secondary | ICD-10-CM | POA: Diagnosis not present

## 2022-09-18 DIAGNOSIS — M25551 Pain in right hip: Secondary | ICD-10-CM

## 2022-09-18 DIAGNOSIS — Z87891 Personal history of nicotine dependence: Secondary | ICD-10-CM

## 2022-09-18 DIAGNOSIS — I82423 Acute embolism and thrombosis of iliac vein, bilateral: Secondary | ICD-10-CM | POA: Diagnosis not present

## 2022-09-18 DIAGNOSIS — Z95828 Presence of other vascular implants and grafts: Secondary | ICD-10-CM

## 2022-09-18 DIAGNOSIS — I77810 Thoracic aortic ectasia: Secondary | ICD-10-CM | POA: Diagnosis present

## 2022-09-18 DIAGNOSIS — N134 Hydroureter: Secondary | ICD-10-CM | POA: Diagnosis not present

## 2022-09-18 DIAGNOSIS — N1330 Unspecified hydronephrosis: Secondary | ICD-10-CM | POA: Diagnosis present

## 2022-09-18 DIAGNOSIS — R52 Pain, unspecified: Secondary | ICD-10-CM | POA: Diagnosis not present

## 2022-09-18 DIAGNOSIS — Z6834 Body mass index (BMI) 34.0-34.9, adult: Secondary | ICD-10-CM

## 2022-09-18 DIAGNOSIS — R252 Cramp and spasm: Secondary | ICD-10-CM | POA: Diagnosis present

## 2022-09-18 DIAGNOSIS — D61810 Antineoplastic chemotherapy induced pancytopenia: Secondary | ICD-10-CM | POA: Diagnosis present

## 2022-09-18 DIAGNOSIS — H547 Unspecified visual loss: Secondary | ICD-10-CM | POA: Diagnosis present

## 2022-09-18 DIAGNOSIS — Z803 Family history of malignant neoplasm of breast: Secondary | ICD-10-CM

## 2022-09-18 DIAGNOSIS — I871 Compression of vein: Secondary | ICD-10-CM | POA: Diagnosis present

## 2022-09-18 DIAGNOSIS — Z91040 Latex allergy status: Secondary | ICD-10-CM

## 2022-09-18 DIAGNOSIS — Z7982 Long term (current) use of aspirin: Secondary | ICD-10-CM

## 2022-09-18 DIAGNOSIS — I420 Dilated cardiomyopathy: Secondary | ICD-10-CM | POA: Diagnosis present

## 2022-09-18 DIAGNOSIS — I5042 Chronic combined systolic (congestive) and diastolic (congestive) heart failure: Secondary | ICD-10-CM | POA: Diagnosis present

## 2022-09-18 DIAGNOSIS — I700 Atherosclerosis of aorta: Secondary | ICD-10-CM | POA: Diagnosis not present

## 2022-09-18 DIAGNOSIS — C50919 Malignant neoplasm of unspecified site of unspecified female breast: Secondary | ICD-10-CM

## 2022-09-18 LAB — CMP (CANCER CENTER ONLY)
ALT: 14 U/L (ref 0–44)
AST: 26 U/L (ref 15–41)
Albumin: 3.3 g/dL — ABNORMAL LOW (ref 3.5–5.0)
Alkaline Phosphatase: 89 U/L (ref 38–126)
Anion gap: 6 (ref 5–15)
BUN: 17 mg/dL (ref 8–23)
CO2: 25 mmol/L (ref 22–32)
Calcium: 8.9 mg/dL (ref 8.9–10.3)
Chloride: 109 mmol/L (ref 98–111)
Creatinine: 1.15 mg/dL — ABNORMAL HIGH (ref 0.44–1.00)
GFR, Estimated: 53 mL/min — ABNORMAL LOW (ref >60)
Glucose, Bld: 96 mg/dL (ref 70–99)
Potassium: 4.6 mmol/L (ref 3.5–5.1)
Sodium: 140 mmol/L (ref 135–145)
Total Bilirubin: 0.5 mg/dL (ref 0.3–1.2)
Total Protein: 6.5 g/dL (ref 6.5–8.1)

## 2022-09-18 LAB — CBC WITH DIFFERENTIAL (CANCER CENTER ONLY)
Abs Immature Granulocytes: 0.03 10*3/uL (ref 0.00–0.07)
Basophils Absolute: 0 10*3/uL (ref 0.0–0.1)
Basophils Relative: 0 %
Eosinophils Absolute: 0.1 10*3/uL (ref 0.0–0.5)
Eosinophils Relative: 4 %
HCT: 31.8 % — ABNORMAL LOW (ref 36.0–46.0)
Hemoglobin: 10.6 g/dL — ABNORMAL LOW (ref 12.0–15.0)
Immature Granulocytes: 1 %
Lymphocytes Relative: 18 %
Lymphs Abs: 0.5 10*3/uL — ABNORMAL LOW (ref 0.7–4.0)
MCH: 30.3 pg (ref 26.0–34.0)
MCHC: 33.3 g/dL (ref 30.0–36.0)
MCV: 90.9 fL (ref 80.0–100.0)
Monocytes Absolute: 0.2 10*3/uL (ref 0.1–1.0)
Monocytes Relative: 7 %
Neutro Abs: 1.9 10*3/uL (ref 1.7–7.7)
Neutrophils Relative %: 70 %
Platelet Count: 162 10*3/uL (ref 150–400)
RBC: 3.5 MIL/uL — ABNORMAL LOW (ref 3.87–5.11)
RDW: 14.2 % (ref 11.5–15.5)
WBC Count: 2.8 10*3/uL — ABNORMAL LOW (ref 4.0–10.5)
nRBC: 0 % (ref 0.0–0.2)

## 2022-09-18 LAB — MAGNESIUM: Magnesium: 2 mg/dL (ref 1.7–2.4)

## 2022-09-18 LAB — GLUCOSE, CAPILLARY: Glucose-Capillary: 95 mg/dL (ref 70–99)

## 2022-09-18 MED ORDER — SODIUM CHLORIDE 0.9 % IV SOLN: INTRAVENOUS | Status: DC

## 2022-09-18 MED ORDER — EMPAGLIFLOZIN 10 MG PO TABS
10.0000 mg | ORAL_TABLET | Freq: Every day | ORAL | Status: AC
  Administered 2022-09-19 – 2022-09-22 (×4): 10 mg via ORAL
  Filled 2022-09-18 (×3): qty 1

## 2022-09-18 MED ORDER — ONDANSETRON HCL 4 MG PO TABS: 4.0000 mg | ORAL_TABLET | Freq: Four times a day (QID) | ORAL | Status: DC | PRN

## 2022-09-18 MED ORDER — ACETAMINOPHEN 650 MG RE SUPP: 650.0000 mg | Freq: Four times a day (QID) | RECTAL | Status: DC | PRN

## 2022-09-18 MED ORDER — GABAPENTIN 400 MG PO CAPS
400.0000 mg | ORAL_CAPSULE | Freq: Every day | ORAL | Status: DC
  Administered 2022-09-18 – 2022-09-25 (×8): 400 mg via ORAL
  Filled 2022-09-18 (×8): qty 1

## 2022-09-18 MED ORDER — HYDROMORPHONE HCL 1 MG/ML IJ SOLN
1.0000 mg | INTRAMUSCULAR | Status: DC | PRN
  Administered 2022-09-18 – 2022-09-24 (×16): 1 mg via INTRAVENOUS
  Filled 2022-09-18 (×17): qty 1

## 2022-09-18 MED ORDER — HYDROMORPHONE HCL 1 MG/ML IJ SOLN
0.5000 mg | Freq: Once | INTRAMUSCULAR | Status: AC
  Administered 2022-09-18: 0.5 mg via INTRAVENOUS
  Filled 2022-09-18: qty 1

## 2022-09-18 MED ORDER — LINACLOTIDE 72 MCG PO CAPS
72.0000 ug | ORAL_CAPSULE | Freq: Every day | ORAL | Status: DC | PRN
  Administered 2022-09-20 – 2022-09-24 (×3): 72 ug via ORAL
  Filled 2022-09-18 (×5): qty 1

## 2022-09-18 MED ORDER — GABAPENTIN 300 MG PO CAPS
300.0000 mg | ORAL_CAPSULE | Freq: Two times a day (BID) | ORAL | Status: DC
  Administered 2022-09-19 – 2022-09-26 (×14): 300 mg via ORAL
  Filled 2022-09-18 (×14): qty 1

## 2022-09-18 MED ORDER — GABAPENTIN 300 MG PO CAPS: 300.0000 mg | ORAL_CAPSULE | Freq: Two times a day (BID) | ORAL | Status: DC

## 2022-09-18 MED ORDER — INSULIN ASPART 100 UNIT/ML IJ SOLN: 0.0000 [IU] | Freq: Three times a day (TID) | INTRAMUSCULAR | Status: DC

## 2022-09-18 MED ORDER — ONDANSETRON HCL 4 MG/2ML IJ SOLN
8.0000 mg | Freq: Once | INTRAMUSCULAR | Status: AC
  Administered 2022-09-18: 8 mg via INTRAVENOUS
  Filled 2022-09-18: qty 4

## 2022-09-18 MED ORDER — ONDANSETRON HCL 4 MG/2ML IJ SOLN
4.0000 mg | Freq: Four times a day (QID) | INTRAMUSCULAR | Status: DC | PRN
  Filled 2022-09-18: qty 2

## 2022-09-18 MED ORDER — OXYCODONE HCL 5 MG PO TABS
5.0000 mg | ORAL_TABLET | ORAL | Status: DC | PRN
  Administered 2022-09-19 – 2022-09-24 (×11): 5 mg via ORAL
  Filled 2022-09-18 (×14): qty 1

## 2022-09-18 MED ORDER — SODIUM CHLORIDE 0.9 % IV SOLN: 8.0000 mg | Freq: Once | INTRAVENOUS | Status: DC

## 2022-09-18 MED ORDER — MORPHINE SULFATE (PF) 2 MG/ML IV SOLN: 2.0000 mg | Freq: Once | INTRAVENOUS | Status: DC

## 2022-09-18 MED ORDER — IOHEXOL 9 MG/ML PO SOLN
500.0000 mL | ORAL | Status: AC
  Administered 2022-09-18 (×2): 500 mL via ORAL

## 2022-09-18 MED ORDER — ACETAMINOPHEN 325 MG PO TABS
650.0000 mg | ORAL_TABLET | Freq: Four times a day (QID) | ORAL | Status: DC | PRN
  Administered 2022-09-23: 650 mg via ORAL
  Filled 2022-09-18: qty 2

## 2022-09-18 MED ORDER — CARVEDILOL 6.25 MG PO TABS
18.7500 mg | ORAL_TABLET | Freq: Two times a day (BID) | ORAL | Status: DC
  Administered 2022-09-19 – 2022-09-20 (×4): 18.75 mg via ORAL
  Filled 2022-09-18 (×4): qty 1

## 2022-09-18 MED ORDER — ASPIRIN 81 MG PO CHEW
81.0000 mg | CHEWABLE_TABLET | Freq: Every day | ORAL | Status: DC
  Administered 2022-09-19 – 2022-09-26 (×8): 81 mg via ORAL
  Filled 2022-09-18 (×9): qty 1

## 2022-09-18 MED ORDER — IOHEXOL 300 MG/ML  SOLN
80.0000 mL | Freq: Once | INTRAMUSCULAR | Status: AC | PRN
  Administered 2022-09-18: 80 mL via INTRAVENOUS

## 2022-09-18 MED ORDER — ATORVASTATIN CALCIUM 40 MG PO TABS
40.0000 mg | ORAL_TABLET | Freq: Every day | ORAL | Status: DC
  Administered 2022-09-19 – 2022-09-26 (×8): 40 mg via ORAL
  Filled 2022-09-18 (×8): qty 1

## 2022-09-18 MED ORDER — ENOXAPARIN SODIUM 40 MG/0.4ML IJ SOSY
40.0000 mg | PREFILLED_SYRINGE | INTRAMUSCULAR | Status: DC
  Administered 2022-09-18 – 2022-09-24 (×2): 40 mg via SUBCUTANEOUS
  Filled 2022-09-18 (×5): qty 0.4

## 2022-09-18 MED ORDER — FUROSEMIDE 20 MG PO TABS
20.0000 mg | ORAL_TABLET | ORAL | Status: DC | PRN
  Administered 2022-09-23: 20 mg via ORAL
  Filled 2022-09-18: qty 1

## 2022-09-18 MED ORDER — BRIMONIDINE TARTRATE 0.2 % OP SOLN
1.0000 [drp] | Freq: Two times a day (BID) | OPHTHALMIC | Status: DC
  Administered 2022-09-19 – 2022-09-26 (×14): 1 [drp] via OPHTHALMIC
  Filled 2022-09-18: qty 5

## 2022-09-18 MED ORDER — SACUBITRIL-VALSARTAN 97-103 MG PO TABS
1.0000 | ORAL_TABLET | Freq: Two times a day (BID) | ORAL | Status: DC
  Administered 2022-09-18 – 2022-09-20 (×5): 1 via ORAL
  Filled 2022-09-18 (×6): qty 1

## 2022-09-18 MED ORDER — SPIRONOLACTONE 25 MG PO TABS
25.0000 mg | ORAL_TABLET | Freq: Every day | ORAL | Status: DC
  Administered 2022-09-19 – 2022-09-26 (×8): 25 mg via ORAL
  Filled 2022-09-18 (×8): qty 1

## 2022-09-18 MED ORDER — VITAMIN B-12 1000 MCG PO TABS
1000.0000 ug | ORAL_TABLET | Freq: Every day | ORAL | Status: DC
  Administered 2022-09-20 – 2022-09-26 (×7): 1000 ug via ORAL
  Filled 2022-09-18 (×8): qty 1

## 2022-09-18 MED ORDER — PANTOPRAZOLE SODIUM 40 MG PO TBEC
40.0000 mg | DELAYED_RELEASE_TABLET | Freq: Every day | ORAL | Status: DC
  Administered 2022-09-20 – 2022-09-22 (×2): 40 mg via ORAL
  Filled 2022-09-18 (×7): qty 1

## 2022-09-18 MED ORDER — SODIUM CHLORIDE 0.9% FLUSH
10.0000 mL | INTRAVENOUS | Status: DC | PRN
  Administered 2022-09-18: 10 mL via INTRAVENOUS

## 2022-09-18 NOTE — Progress Notes (Signed)
CT made aware patient finished with prep. CT needed implanted port to have power injectable access, IV team consulted and notified. Awaiting follow up.

## 2022-09-18 NOTE — Progress Notes (Signed)
Patient reported "poor bedside nursing care" because I "didn't listen to what I needed. I didn't want a pain pill, I wanted my gabapentin." Explained to patient that gabepentin wasn't available and needed to be verified by pharmacy when patient called out for the medication, so I proceeded to bring her prn oxycodone since patient complained of pain. Patient stated she didn't want it, oxycodone was wasted with another RN and cool compress was given to patient per patient request.   Once gabapentin verified, administered to patient, although patient refused at first and said "I don't need it now, I needed it earlier." As RN was leaving room, patient stated "I do want my gabapentin."  Patient stated no further needs at this time.

## 2022-09-18 NOTE — Progress Notes (Signed)
Bilateral lower extremity venous duplex has been completed. Preliminary results can be found in CV Proc through chart review.   09/18/22 5:05 PM Kristin Ward RVT

## 2022-09-18 NOTE — Progress Notes (Signed)
[place]  Telephone:([phone]) [phone] Fax:(336) [phone]   Name: Kristin Ward [facility] Date: 09/18/2022 MRN: [id number]  DOB: March 17, 1956  Patient Care Team: [name], MD as PCP - General (Hematology) [name], MD as PCP - Cardiology (Cardiology) [name], [name], MD as PCP - Advanced Heart Failure (Cardiology) [name], MD as Consulting Physician (Hematology) [name], MD as Consulting Physician (General Surgery) [name], [facility] (Inactive) as Pharmacist (Pharmacist) Jacelyn Pi, MD as Referring Physician (Endocrinology)    INTERVAL HISTORY: Kristin Ward is a 67 y.o. 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 Patient is s/p left breast lumpectomy, adjuvant chemo and Radiation. Biopsy of right gluteus on 09/02/20 due to new pain showed metastatic carcinoma, consistent with breast primary. Palliative ask to see for symptom and pain management and goals of care   SOCIAL HISTORY:     reports that she quit smoking about 6 years ago. Her smoking use included cigarettes. She has a 5.75 pack-year smoking history. She has never used smokeless tobacco. She reports that she does not drink alcohol and does not use drugs.  ADVANCE DIRECTIVES:  MOST and DNR filed  CODE STATUS: DNR  PAST MEDICAL HISTORY: Past Medical History:  Diagnosis Date   Abnormal x-ray of lungs with single pulmonary nodule 03/15/2016   per records from [place]    Anemia    Asthma    Cholelithiasis 05/19/2017   On CT   Coronary artery calcification seen on CAT scan 05/19/2017   Diabetes mellitus without complication ([place])    type 2   Diabetes mellitus, new onset ([place]) 01/20/2019   Dilated idiopathic cardiomyopathy ([place]) 12/2015   EF 15-20%. Diagnosed in [facility]   Headache    NONE RECENT   Hypertension     Lymph nodes enlarged 07/25/2020   Malignant neoplasm of lower-outer quadrant of left breast of female, estrogen receptor negative ([place]) 06/11/2017   left breast   Mixed hyperlipidemia 06/17/2018   partially blind    Personal history of chemotherapy 2018-2019   Personal history of radiation therapy 2019   Retinitis pigmentosa    Retroperitoneal lymphadenopathy 07/25/2020   Uveitis     ALLERGIES:  is allergic to morphine and related, latex, tylenol with codeine #3 [acetaminophen-codeine], and penicillins.  MEDICATIONS:  Current Outpatient Medications  Medication Sig Dispense Refill   Accu-Chek FastClix Lancets MISC TEST TWICE A DAY. PT USES AN ACCU-CHEK GUIDE ME METER 102 each 2   amLODipine (NORVASC) 5 MG tablet Take 5 mg by mouth daily as needed (high B/P).     Ascorbic Acid (VITAMIN C) 500 MG CAPS Take 500 mg by mouth daily.     aspirin 81 MG chewable tablet Chew 81 mg by mouth daily.     atorvastatin (LIPITOR) 40 MG tablet Take 1 tablet (40 mg total) by mouth daily. 90 tablet 3   brimonidine (ALPHAGAN) 0.2 % ophthalmic solution Place 1 drop into both eyes 2 (two) times daily.     carvedilol (COREG) 12.5 MG tablet TAKE 1 AND 1/2 TABLETS BY MOUTH TWICE DAILY qm 90 tablet 10   Cholecalciferol (VITAMIN D) 50 MCG (2000 UT) CAPS Take 2,000 Units by mouth daily.     empagliflozin (JARDIANCE) 10 MG TABS tablet Take 1 tablet (10 mg total) by mouth daily before breakfast. 90 tablet 3   furosemide (LASIX) 20 MG tablet  TAKE 1-2 TABLETS BY MOUTH AS NEEDED FOR WEIGHT GAIN 3 TO 4 LBS OR SIGNIFICANT LEG EDEMA 180 tablet 1   gabapentin (NEURONTIN) 300 MG capsule Take 3 caps twice a day during the day and 4 caps at bedtime 300 capsule 1   HUMALOG KWIKPEN 100 UNIT/ML KwikPen Inject 3-10 Units into the skin 3 (three) times daily before meals. Per sliding scale     ibuprofen (ADVIL) 800 MG tablet Take 1 tablet (800 mg total) by mouth every 8 (eight) hours as needed. 30 tablet 2   Lancets Misc.  (ACCU-CHEK SOFTCLIX LANCET DEV) KIT 1 Units by Does not apply route 2 (two) times daily. Check FSBS BID - Include strips # 50 with 5 refills, Lancets #50 with 5 refills DX: Type 2 DM - ICD 10: E11.9 1 kit 0   lidocaine-prilocaine (EMLA) cream Apply 1 application topically as needed. 30 g 0   linaclotide (LINZESS) 72 MCG capsule Take 1 capsule (72 mcg total) by mouth daily as needed (constipation). 30 capsule 2   magic mouthwash (nystatin, lidocaine, diphenhydrAMINE, alum & mag hydroxide) suspension Swish and swallow 5 mLs by mouth 4 (four) times daily as needed for mouth pain 140 mL 0   Multiple Vitamins-Minerals (WOMENS MULTI PO) Take 15 mLs by mouth daily.     ondansetron (ZOFRAN) 8 MG tablet Take 1 tablet (8 mg total) by mouth every 8 (eight) hours as needed for nausea or vomiting (begin on day 3 after chemo). 30 tablet 1   oxyCODONE (OXY IR/ROXICODONE) 5 MG immediate release tablet Take 1 tablet (5 mg total) by mouth every 6 (six) hours as needed for severe pain. 60 tablet 0   pantoprazole (PROTONIX) 40 MG tablet TAKE 1 TABLET BY MOUTH EVERY DAY 90 tablet 1   prochlorperazine (COMPAZINE) 10 MG tablet Take 1 tablet (10 mg total) by mouth every 6 (six) hours as needed (Nausea or vomiting). 30 tablet 1   promethazine (PHENERGAN) 25 MG tablet Take 1 tablet (25 mg total) by mouth every 6 (six) hours as needed for nausea or vomiting. 30 tablet 0   sacubitril-valsartan (ENTRESTO) 97-103 MG Take 1 tablet by mouth 2 (two) times daily. 60 tablet 10   spironolactone (ALDACTONE) 25 MG tablet Take 1 tablet (25 mg total) by mouth daily. 30 tablet 10   vitamin B-12 (CYANOCOBALAMIN) 1000 MCG tablet Take 1,000 mcg by mouth daily.     zinc gluconate 50 MG tablet Take 50 mg by mouth daily.     Current Facility-Administered Medications  Medication Dose Route Frequency Provider Last Rate Last Admin   insulin glulisine (APIDRA) injection 6 Units  6 Units Subcutaneous Once Francis Gaines B, PA-C        VITAL  SIGNS: There were no vitals taken for this visit. There were no vitals filed for this visit.  Estimated body mass index is 35.02 kg/m as calculated from the following:   Height as of 08/09/22: 5' 7.25" (1.708 m).   Weight as of 09/11/22: 225 lb 4 oz (102.2 kg).   PERFORMANCE STATUS (ECOG) : 1 - Symptomatic but completely ambulatory   Physical Exam General: NAD Cardiovascular: regular rate and rhythm Pulmonary: clear ant fields Abdomen: soft, nontender, + bowel sounds Extremities: significant right leg lymphedema, firm, extending from thigh down to foot, right foot brace,  Skin: no rashes, dry  Neurological: AAO x3   IMPRESSION: Ms. Ratcliffe presents to clinic today for symptom management follow-up. Unfortunately she continues to have significant pain in her lower  back and right lower extremity. Seen by Dr. Burr Medico and is being admitted for further work-up and pain control.   Patient shares she has been taking medications as prescribed however over the past 1-2 weeks pain and swelling has intensified. She has also been working with PT for additional support. Denies any recent falls or injuries.   Appetite is fair. Denies nausea, vomiting, constipation, or diarrhea.   We discussed plans for hospital admission as advised per Dr. Burr Medico. Kristin Ward verbalized understanding. She becomes tearful expressing her hopes that she was going to get better but this is not happening. 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 Emotional support provided. We discussed taking things one day at a time and aggressively managing symptoms.   I will plan to support and follow during hospitalization.   I discussed the importance of continued conversation with family and their medical providers regarding overall plan of care and treatment options, ensuring decisions are within the context of the patients values and GOCs.  PLAN: Patient  being directly admitted per Dr. Burr Medico  IVF, IV hydromorphone 0.5 mg x1, Zofran 8 mg IV  Will continue to closely monitor and adjust medications according to symptoms. I will plan to follow or have colleague to support during hospitalization for ongoing pain management.     Patient expressed understanding and was in agreement with this plan. She also understands that She can call the clinic at any time with any questions, concerns, or complaints.   Time Total: 35 min   Visit consisted of counseling and education dealing with the complex and emotionally intense issues of symptom management and palliative care in the setting of serious and potentially life-threatening illness.Greater than 50%  of this time was spent counseling and coordinating care related to the above assessment and plan.  Alda Lea, AGPCNP-BC  Palliative Medicine Team/[facility]

## 2022-09-18 NOTE — H&P (Signed)
History and Physical    Patient: Kristin Ward STM:[id number] DOB: 20-Jul-1956 DOA: 09/18/2022 DOS: the patient was seen and examined on 09/18/2022 PCP: [name], MD  Patient coming from: Home  Chief Complaint: Right lower extremity edema.  HPI: Kristin Ward is a 67 y.o. female with medical history significant of pulmonary nodule, anemia, asthma, cholelithiasis, coronary artery calcification, type 2 diabetes, chronic combined systolic and diastolic heart failure, history of idiopathic dilated cardiomyopathy, headaches, hypertension, hyperlipidemia, BLE , Retinitis pigmentosa, uveitis, breast cancer with history of chemotherapy, history of radiation therapy, retroperitoneal lymphadenopathy who was sent from the cancer center due to progressively worse right lower extremity edema and pain in the last few weeks.  No fever, chills or night sweats. No sore throat, rhinorrhea, dyspnea, wheezing or hemoptysis.  No chest pain, palpitations, diaphoresis, PND or orthopnea.  Occasional nausea, but no current abdominal pain, diarrhea, melena or hematochezia.  She gets frequently constipated.  No flank pain, dysuria, frequency or hematuria.  No polyuria, polydipsia, polyphagia or blurred vision.  Lab work today: CBC showed a white count 2.8, hemoglobin 10.6 g/dL and platelets 162.  CMP showed creatinine of 1.15 mg/dL and albumin of 3.3 g/dL.  The rest of the CMP measurements were normal.  Review of Systems: As mentioned in the history of present illness. All other systems reviewed and are negative.  Past Medical History:  Diagnosis Date   Abnormal x-ray of lungs with single pulmonary nodule 03/15/2016   per records from [place]    Anemia    Asthma    Cholelithiasis 05/19/2017   On CT   Coronary artery calcification seen on CAT scan 05/19/2017   Diabetes mellitus without complication ([place])    type 2   Diabetes mellitus, new onset ([place]) 01/20/2019   Dilated idiopathic cardiomyopathy ([place])  12/2015   EF 15-20%. Diagnosed in [facility]   Headache    NONE RECENT   Hypertension    Lymph nodes enlarged 07/25/2020   Malignant neoplasm of lower-outer quadrant of left breast of female, estrogen receptor negative ([place]) 06/11/2017   left breast   Mixed hyperlipidemia 06/17/2018   partially blind    Personal history of chemotherapy 2018-2019   Personal history of radiation therapy 2019   Retinitis pigmentosa    Retroperitoneal lymphadenopathy 07/25/2020   Uveitis    Past Surgical History:  Procedure Laterality Date   brain cyst removed  2007   to help with headaches per patient , aspirated    BREAST BIOPSY Left 06/06/2017   x2   BREAST CYST ASPIRATION Left 06/06/2017   BREAST LUMPECTOMY Left 06/26/2017   BREAST LUMPECTOMY WITH RADIOACTIVE SEED AND SENTINEL LYMPH NODE BIOPSY Left 06/26/2017   Procedure: BREAST LUMPECTOMY WITH RADIOACTIVE SEED AND SENTINEL LYMPH NODE BIOPSY ERAS  PATHWAY;  Surgeon: [name], MD;  Location: [place];  Service: General;  Laterality: Left;  pec block   FINE NEEDLE ASPIRATION Left 02/23/2019   Procedure: FINE NEEDLE ASPIRATION;  Surgeon: [name], MD;  Location: [place];  Service: General;  Laterality: Left;  Asiration of left breast seroma    IR IMAGING GUIDED PORT INSERTION  10/13/2020   NASAL TURBINATE REDUCTION     PORT-A-CATH REMOVAL Right 02/23/2019   Procedure: REMOVAL PORT-A-CATH;  Surgeon: [name], MD;  Location: [place];  Service: General;  Laterality: Right;   PORTACATH PLACEMENT N/A 06/26/2017   Procedure: INSERTION PORT-A-CATH;  Surgeon: [name], MD;  Location: [place];  Service: General;  Laterality: N/A;   RIGHT/LEFT HEART CATH AND CORONARY ANGIOGRAPHY N/A 07/31/2022   Procedure: RIGHT/LEFT HEART CATH AND CORONARY ANGIOGRAPHY;  Surgeon: Jolaine Artist, MD;  Location: [place] [facility];  Service: Cardiovascular;  Laterality: N/A;   TRANSTHORACIC  ECHOCARDIOGRAM  12/2015   A) [facility] May 2017: Mild concentric LVH. Global hypokinesis. GR daily. EF 18% severe LA dilation. Mitral annular dilatation with papillary muscle dysfunction and moderate MR. Dilated IVC consistent with elevated RAP.   TRANSTHORACIC ECHOCARDIOGRAM  05/2017; 08/2017   a) Mild concentric LVH.  EF 45 to 50% with diffuse hypokinesis.  GR 1 DD.  Mild aortic root dilation.;; b)  Mild LVH EF 45 to 50%.  Diffuse HK.  No significant valve disease.  No significant change.   TRANSTHORACIC ECHOCARDIOGRAM  01/30/2022   Pre[place]:EF 35 to 40%.  Moderate dysfunction with global HK.  Abnormal strain.  Normal RV size and function.  Normal RVSP.  Aortic root estimated 42 mm.  Mild MR.  Mild/trivial AI.  Normal RVP.;  04/01/2022: Preliminary read 40%.  Formal read 45 to 50% (probably 40 to 45%).  Global HK.  GR 1 DD.  Normal RV size and RVP.  Mild MR.  Now measured at 38 mm.  Normal RAP.   Social History:  reports that she quit smoking about 6 years ago. Her smoking use included cigarettes. She has a 5.75 pack-year smoking history. She has never used smokeless tobacco. She reports that she does not drink alcohol and does not use drugs.  Allergies  Allergen Reactions   Morphine And Related Nausea And Vomiting   Latex Hives and Itching    Burning    Tylenol With Codeine #3 [Acetaminophen-Codeine] Nausea And Vomiting   Penicillins Nausea Only, Swelling and Rash    Has patient had a PCN reaction causing immediate rash, facial/tongue/throat swelling, SOB or lightheadedness with hypotension: Yes Has patient had a PCN reaction causing severe rash involving mucus membranes or skin necrosis: Yes Has patient had a PCN reaction that required hospitalization: No Has patient had a PCN reaction occurring within the last 10 years: No TONGUE SWELLING AND RASH AROUND MOUTH If all of the above answers are "NO", then may proceed with Cephalosporin use.     Family History  Problem Relation  Age of Onset   Colon cancer Mother 50       deceased 70   Heart attack Father    Breast cancer Other 18       2nd cousin on maternal side; currently 92   Pancreatic cancer Other        2nd cousin once removed on maternal side; deceased 78s   Fibromyalgia Sister    Esophageal cancer Neg Hx    Rectal cancer Neg Hx    Stomach cancer Neg Hx     Prior to Admission medications   Medication Sig Start Date End Date Taking? Authorizing Provider  Accu-Chek FastClix Lancets MISC TEST TWICE A DAY. PT USES AN ACCU-CHEK GUIDE ME METER 01/29/21   Henson, Vickie L, NP-C  amLODipine (NORVASC) 5 MG tablet Take 5 mg by mouth daily as needed (high B/P).    [provider]  Ascorbic Acid (VITAMIN C) 500 MG CAPS Take 500 mg by mouth daily.    [provider]  aspirin 81 MG chewable tablet Chew 81 mg by mouth daily.    [provider]  atorvastatin (LIPITOR) 40 MG tablet Take 1 tablet (40 mg total) by mouth daily. 05/11/22  [name], MD  brimonidine (ALPHAGAN) 0.2 % ophthalmic solution Place 1 drop into both eyes 2 (two) times daily. 09/19/21   [provider]  carvedilol (COREG) 12.5 MG tablet TAKE 1 AND 1/2 TABLETS BY MOUTH TWICE DAILY qm 06/07/22   [name], MD  Cholecalciferol (VITAMIN D) 50 MCG (2000 UT) CAPS Take 2,000 Units by mouth daily.    [provider]  empagliflozin (JARDIANCE) 10 MG TABS tablet Take 1 tablet (10 mg total) by mouth daily before breakfast. 07/01/22   [name], [name], MD  furosemide (LASIX) 20 MG tablet TAKE 1-2 TABLETS BY MOUTH AS NEEDED FOR WEIGHT GAIN 3 TO 4 LBS OR SIGNIFICANT LEG EDEMA 09/06/22   [name], MD  gabapentin (NEURONTIN) 300 MG capsule Take 3 caps twice a day during the day and 4 caps at bedtime 09/12/22   [name], MD  HUMALOG KWIKPEN 100 UNIT/ML KwikPen Inject 3-10 Units into the skin 3 (three) times daily before meals. Per sliding scale 11/23/21   [provider]  ibuprofen (ADVIL) 800 MG tablet  Take 1 tablet (800 mg total) by mouth every 8 (eight) hours as needed. 09/07/21   [name], MD  Lancets Misc. (ACCU-CHEK SOFTCLIX LANCET DEV) KIT 1 Units by Does not apply route 2 (two) times daily. Check FSBS BID - Include strips # 50 with 5 refills, Lancets #50 with 5 refills DX: Type 2 DM - ICD 10: E11.9 10/12/21   Francis Gaines B, PA-C  lidocaine-prilocaine (EMLA) cream Apply 1 application topically as needed. 10/23/20   [name], MD  linaclotide [facility]) 72 MCG capsule Take 1 capsule (72 mcg total) by mouth daily as needed (constipation). 08/28/22   Pickenpack-Cousar, Carlena Sax, NP  magic mouthwash (nystatin, lidocaine, diphenhydrAMINE, alum & mag hydroxide) suspension Swish and swallow 5 mLs by mouth 4 (four) times daily as needed for mouth pain 06/20/22   [name], MD  Multiple Vitamins-Minerals (WOMENS MULTI PO) Take 15 mLs by mouth daily.    [provider]  ondansetron (ZOFRAN) 8 MG tablet Take 1 tablet (8 mg total) by mouth every 8 (eight) hours as needed for nausea or vomiting (begin on day 3 after chemo). 12/21/21   [name], MD  oxyCODONE (OXY IR/ROXICODONE) 5 MG immediate release tablet Take 1 tablet (5 mg total) by mouth every 6 (six) hours as needed for severe pain. 08/28/22   Pickenpack-Cousar, Carlena Sax, NP  pantoprazole (PROTONIX) 40 MG tablet TAKE 1 TABLET BY MOUTH EVERY DAY 12/06/21   [name], MD  prochlorperazine (COMPAZINE) 10 MG tablet Take 1 tablet (10 mg total) by mouth every 6 (six) hours as needed (Nausea or vomiting). 02/15/22   [name], MD  promethazine (PHENERGAN) 25 MG tablet Take 1 tablet (25 mg total) by mouth every 6 (six) hours as needed for nausea or vomiting. 03/08/22   [name], MD  sacubitril-valsartan (ENTRESTO) 97-103 MG Take 1 tablet by mouth 2 (two) times daily. 04/19/22   [name], MD  spironolactone (ALDACTONE) 25 MG tablet Take 1 tablet (25 mg total) by mouth daily. 04/19/22   [name], MD  vitamin B-12 (CYANOCOBALAMIN) 1000 MCG tablet  Take 1,000 mcg by mouth daily.    [provider]  zinc gluconate 50 MG tablet Take 50 mg by mouth daily.    [provider]    Physical Exam: There were no vitals filed for this visit. Physical Exam Vitals and nursing note reviewed.  Constitutional:  General: She is awake.     Appearance: Normal appearance. She is obese. She is ill-appearing.     Comments: Chronically ill-appearing.  HENT:     Head: Normocephalic.     Mouth/Throat:     Mouth: Mucous membranes are moist.  Eyes:     General: No scleral icterus.    Pupils: Pupils are equal, round, and reactive to light.  Cardiovascular:     Rate and Rhythm: Normal rate and regular rhythm.     Comments: Severe lymphedema and pitting edema of RLE. Pulmonary:     Effort: Pulmonary effort is normal.     Breath sounds: Normal breath sounds.  Abdominal:     General: Bowel sounds are normal.     Palpations: Abdomen is soft.  Musculoskeletal:     Cervical back: Neck supple.     Right lower leg: Edema present.     Left lower leg: No edema.  Skin:    General: Skin is warm and dry.  Neurological:     General: No focal deficit present.     Mental Status: She is alert and oriented to person, place, and time.  Psychiatric:        Mood and Affect: Mood normal.        Behavior: Behavior normal. Behavior is cooperative.   Data Reviewed:   Results are pending, will review when available.  07/01/2022 echocardiogram IMPRESSIONS:   1. Left ventricular ejection fraction, by estimation, is 40 to 45%. The  left ventricle has mildly decreased function. The left ventricle  demonstrates global hypokinesis. There is mild concentric left ventricular  hypertrophy. Left ventricular diastolic  parameters are consistent with Grade I diastolic dysfunction (impaired  relaxation).   2. Right ventricular systolic function is normal. The right ventricular  size is normal.   3. Left atrial size was mildly dilated.   4. The  mitral valve is normal in structure. Mild mitral valve  regurgitation. No evidence of mitral stenosis.   5. The aortic valve is normal in structure. Aortic valve regurgitation is  not visualized. No aortic stenosis is present.   6. Aortic dilatation noted. There is borderline dilatation of the  ascending aorta, measuring 39 mm.   7. The inferior vena cava is normal in size with greater than 50%  respiratory variability, suggesting right atrial pressure of 3 mmHg.   Assessment and Plan: Principal Problem:   Intractable pain In the setting of:   Unilateral edema of lower extremity In the setting of:   Malignant neoplasm of lower-outer quadrant  of left breast of female, estrogen receptor negative ([place]) Complicated by:   Malignant neoplasm metastatic to bone (HCC) Observation/MedSurg. Analgesics as needed. Antiemetics as needed. Check Doppler ultrasound for DVT. Follow CBC, CMP and lipase in AM. Check CT abdomen/pelvis with contrast. Dr. Burr Medico input appreciated.  Active Problems:   Chronic combined systolic and diastolic heart failure (HCC) No signs of decompensation. Continue furosemide as needed. Continue Entresto 97/103 mg twice daily. Continue carvedilol 18.75 mg p.o. twice daily.    Essential hypertension Continue carvedilol and Entresto as above. Monitor blood pressure    COPD (chronic obstructive pulmonary disease) (HCC) Bronchodilators and supplemental oxygen as needed.    Anemia/Leukopenia Follow-up CBC in the morning.    Mixed hyperlipidemia Continue atorvastatin 40 mg p.o. daily.    Peripheral neuropathy due to chemotherapy (HCC) Continue gabapentin 300 mg p.o. twice daily. Continue gabapentin 400 mg p.o. at bedtime.    Type 2 diabetes mellitus (HCC) Carbohydrate  modified diet CBG monitoring with RI SS. Check hemoglobin A1c.    Advance Care Planning:   Code Status: Full Code   Consults:   Family Communication:   Severity of Illness: The  appropriate patient status for this patient is INPATIENT. Inpatient status is judged to be reasonable and necessary in order to provide the required intensity of service to ensure the patient's safety. The patient's presenting symptoms, physical exam findings, and initial radiographic and laboratory data in the context of their chronic comorbidities is felt to place them at high risk for further clinical deterioration. Furthermore, it is not anticipated that the patient will be medically stable for discharge from the hospital within 2 midnights of admission.   * I certify that at the point of admission it is my clinical judgment that the patient will require inpatient hospital care spanning beyond 2 midnights from the point of admission due to high intensity of service, high risk for further deterioration and high frequency of surveillance required.*  Author: Reubin Milan, MD 09/18/2022 3:36 PM  For on call review [email or web address].   This document was prepared using Dragon voice recognition software and may contain some unintended transcription errors.

## 2022-09-18 NOTE — Progress Notes (Signed)
[place]   [phone] [phone]   Clinic Follow up Note   Patient Care Team: [name], MD as PCP - General (Hematology) [name], MD as PCP - Cardiology (Cardiology) [name], [name], MD as PCP - Advanced Heart Failure (Cardiology) [name], MD as Consulting Physician (Hematology) [name], MD as Consulting Physician (General Surgery) [name], [facility] (Inactive) as Pharmacist (Pharmacist) Jacelyn Pi, MD as Referring Physician (Endocrinology)  Date of Service:  09/18/2022  CHIEF COMPLAINT: f/u of metastatic breast cancer     CURRENT THERAPY:   Carbolpatin,+Gemcitabine q21d , Pembrolizumab q21   ASSESSMENT:  Kristin Ward is a 67 y.o. 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 S/p left breast lumpectomy, adjuvant chemo TC and Radiation.  -biopsy of right gluteus on 09/02/20 due to new pain showed metastatic carcinoma, consistent with breast primary. ER 20% weakly positive, PR and HER2 negative.  -s/p multiple line chemo, including taxol, xeloda, Trodelvy, enhertu -she was on [place], but developed worsening neuropathy and stopped after 07/05/22 and she requested a chemo break  -she started carboplatin and gemcitabine, plus Keytruda last week, tolerated well. -I suspect that she has cancer progression due to the significantly worsening right lower extremity edema, we will repeat a CT scan, and see if she is a candidate for palliative radiation.  Malignant neoplasm metastatic to bone [facility]) She developed radiating right hip pain in 2020 from right iliac wing mass. S/p palliative radiation to right iliac 09/04/20 -  09/22/20 with Dr. Lisbeth Renshaw. -Given localized bone met, she started Zometa q89month on 11/20/20  Cancer related pain -received multiple palliative RT -on oxycodone and garbapentin -f/u with palliative care clinic   Peripheral neuropathy due to chemotherapy ([facility] -S/p C2D8 Taxol she developed mild tingling in her hands and feet. Taxol stopped after 12/18/20.  -worsening with chemo   Right leg edema -Probably secondary to cancer progression -Patient has severe lymphedema and pain, [facility] and exped[facility] workup, including CT abdomen pelvis, and Doppler of the right distal extremity to rule out DVT  PLAN: -Due to her severe right lower extremity pain and edema, will admit her to hospital today -Will give IV fluids and pain medication in the infusion room while she is waiting for hospital bed -Hold on chemotherapy today. -Palliative care nurse practitioner NLexine Batonwill see her today, and follow-up her in hospital   SUMMARY OF ONCOLOGIC HISTORY: Oncology History Overview Note  Cancer Staging Malignant neoplasm of lower-outer quadrant of left breast of female, estrogen receptor negative ([place] Staging form: Breast, AJCC 8th Edition - Clinical stage from 06/06/2017: Stage IB (cT1c, cN0, cM0, G3, ER-, PR-, HER2-) - Signed by FTruitt Merle MD on 06/15/2017 Nuclear grade: G3 Histologic grading system: 3 grade system - Pathologic stage from 06/26/2017: Stage IB (pT1c, pN0, cM0, G3, ER-, PR-, HER2-) - Signed by FTruitt Merle MD on 07/10/2017 Neoadjuvant therapy: No Nuclear grade: G3 Multigene prognostic tests performed: None Histologic grading system: 3 grade system Laterality: Left     Malignant neoplasm of lower-outer quadrant of left breast of female, estrogen receptor negative ([place]  05/30/2017 Mammogram   Diagnostic mammo and [place]PRESSION: 1.  Highly suspicious 1.4 cm mass in the slightly lower slightly outer left breast -tissue sampling  recommended. 2. Indeterminate 0.5 mm mass in the slightly lower slightly outer left breast -tissue sampling recommended. 3. At least 2 left axillary lymph nodes with borderline cortical thickness.   06/06/2017 Initial Biopsy   Diagnosis 1. Breast, left, needle core biopsy, 5:30 o'clock - INVASIVE DUCTAL CARCINOMA, G3 2. Lymph node, needle/core biopsy, left axillary - NO CARCINOMA IDENTIFIED IN ONE LYMPH NODE (0/1)   06/06/2017 Initial Diagnosis   Malignant neoplasm of lower-outer quadrant of left breast of female, estrogen receptor negative ([place])   06/06/2017 Receptors her2   Estrogen Receptor: 0%, NEGATIVE Progesterone Receptor: 0%, NEGATIVE Proliferation Marker Ki67: 70%   06/26/2017 Surgery   LEFT BREAST LUMPECTOMY WITH RADIOACTIVE SEED AND SENTINEL LYMPH NODE BIOPSY ERAS  PATHWAY AND INSERTION PORT-A-CATH By Dr. Barry Dienes on 06/26/17    06/26/2017 Pathology Results   Diagnosis 06/26/17  1. Breast, lumpectomy, Left - INVASIVE DUCTAL CARCINOMA, GRADE III/III, SPANNING 1.2 CM. - THE SURGICAL RESECTION MARGINS ARE NEGATIVE FOR CARCINOMA. - SEE ONCOLOGY TABLE BELOW. 2. Lymph node, sentinel, biopsy, Left axillary #1 - THERE IS NO EVIDENCE OF CARCINOMA IN 1 OF 1 LYMPH NODE (0/1). 3. Lymph node, sentinel, biopsy, Left axillary #2 - THERE IS NO EVIDENCE OF CARCINOMA IN 1 OF 1 LYMPH NODE (0/1). 4. Lymph node, sentinel, biopsy, Left axillary #3 - THERE IS NO EVIDENCE OF CARCINOMA IN 1 OF 1 LYMPH NODE (0/1).    07/25/2017 - 09/29/2017 Chemotherapy   Adjuvant cytoxan and docetaxel (TC) every 3 weekd for 4 cycles     11/19/2017 - 12/17/2017 Radiation Therapy   Adjuvant breast radiation Left breast treated to 42.5 Gy with 17 fx of 2.5 Gy followed by a boost of 7.5 Gy with 3 fx of 2.5 Gy      02/2019 Procedure   She had PAC removal in 02/2019.    07/23/2020 Imaging   MRI Lumbar Spine 07/23/20 IMPRESSION: 1. No significant disc herniation, spinal canal or neural foraminal stenosis  at any level. 2. Moderate facet degenerative changes at L3-4, L4-5 and L5-S1. 3. Multiple enlarged retroperitoneal and right iliac lymph nodes. Recommend correlation with CT of the abdomen and pelvis with contrast.   08/15/2020 Imaging   CT AP 08/15/20  IMPRESSION: 1. Right iliac and periaortic adenopathy is noted concerning for metastatic disease or lymphoma. Also noted is abnormal soft tissue mass anterior and posterior to the right iliac wing concerning for malignancy or metastatic disease. MRI is recommended for further evaluation. 2. Irregular lucency with sclerotic margins is seen involving the right iliac crest concerning for possible lytic lesion. MRI may be performed for further evaluation. 3. Enlarged fibroid uterus. 4. Small gallstone. 5. Aortic atherosclerosis.   08/22/2020 Pathology Results   FINAL MICROSCOPIC DIAGNOSIS: 08/22/20  A. NEEDLE CORE, RIGHT GLUTEUS MINIMUMUS, BIOPSY:  -  Metastatic carcinoma  -  See comment   COMMENT:  By immunohistochemistry, the neoplastic cells are positive for  cytokeratin-7 and have weak patchy positivity for GATA3 but are negative for cytokeratin-20 and GCDFP.  The combined morphology and immunophenotype, support metastasis from the patient's known breast primary carcinoma.   ADDENDUM:  PROGNOSTIC INDICATOR RESULTS:  The tumor cells are NEGATIVE for Her2 (0).  Estrogen Receptor:       POSITIVE, 20%, WEAK STAINING  Progesterone Receptor:   NEGATIVE   PD-L1 (0) negative    09/04/2020 - 09/22/2020 Radiation Therapy   Palliative radiation to right iliac 09/04/20 -  09/22/20, Dr. Lisbeth Renshaw   09/26/2020 -  Chemotherapy   first line weekly Taxol 3 weeks on/1 week off starting 09/26/20 --Reduced to 2 weeks on/1 week off starting with C3 due to neuropathy. Stopped after C4 on 12/18/20 due to neuratrophy.  --Switched to Xeloda on 01/01/21 at '1500mg'$  in the AM and '2000mg'$  in the PM for 2 weeks on/1 week off     10/13/2020 Procedure    PAC placement on  10/13/20.    04/10/2021 Imaging   CT CAP  IMPRESSION: 1. Enlarging abdominopelvic retroperitoneal and right inguinal adenopathy, compatible with disease progression. 2. Lytic metastasis in the right iliac wing, similar. 3. Hepatomegaly. 4. Cholelithiasis. 5. Enlarged fibroid uterus. 6.  Aortic atherosclerosis (ICD10-I70.0).   06/18/2021 Imaging   CT CAP  IMPRESSION: 1. Today's study demonstrates progression of metastatic disease as evidence by increased number and size of numerous enlarged lymph nodes in the retroperitoneum, along the right pelvic sidewall, and in the upper right thigh. 2. Osseous metastases in the bony pelvis appear similar to the prior examination. No definite new osseous lesions are otherwise noted. 3. Fibroid uterus again noted. 4. Cholelithiasis without evidence of acute cholecystitis. 5. Aortic atherosclerosis, in addition to left anterior descending coronary artery disease. Please note that although the presence of coronary artery calcium documents the presence of coronary artery disease, the severity of this disease and any potential stenosis cannot be assessed on this non-gated CT examination. Assessment for potential risk factor modification, dietary therapy or pharmacologic therapy may be warranted, if clinically indicated. 6. Additional incidental findings, as above.   06/20/2021 Imaging   Bone Scan  IMPRESSION: No osseous uptake suspicious for recurrent/progressive metastatic disease.   07/03/2021 - 01/11/2022 Chemotherapy   Patient is on Treatment Plan : BREAST METASTATIC Sacituzumab govitecan-hziy [name]) q21d     09/27/2021 Imaging   EXAM: CT CHEST, ABDOMEN, AND PELVIS WITH CONTRAST  IMPRESSION: 1. Marked interval improvement in previously demonstrated lymphadenopathy in the abdomen and pelvis. A few prominent right inguinal lymph nodes remain. 2. Stable osseous metastatic disease in the pelvis. 3. No evidence of local recurrence or other  metastatic disease. 4. Stable incidental findings including cholelithiasis, uterine fibroids and Aortic Atherosclerosis (ICD10-I70.0).  ADDENDUM: Not mentioned in the original impression is a possible nonocclusive fibrin sheath along the distal aspect of the Port-A-Cath.   09/27/2021 Imaging   EXAM: NUCLEAR MEDICINE WHOLE BODY BONE SCAN  IMPRESSION: Stable examination, without abnormal osseous uptake.   11/27/2021 Imaging   CT AP IMPRESSION: 1. Stable lytic and sclerotic lesion in the RIGHT iliac wing. 2. No evidence of visceral metastasis or adenopathy in the abdomenpelvis. 3. Leiomyomatous uterus.  ADDENDUM REPORT: 11/29/2021 09:33 Upon further review and discussion with Cira Rue NP, the RIGHT inguinal lymph nodes previous described have increased in volume. For example 14 mm node (image 91/2) is increased from 9 mm. Adjacent 14 mm node on image 86 is increased from 8 mm. Additionally, LEFT common iliac lymph node is also slightly increased measuring 10 mm (46/2) compared to 7 mm on comparison exam from 09/27/2021. These sites demonstrates similar but greater lymphadenopathy on CT 06/18/2021.   02/15/2022 - 03/08/2022 Chemotherapy   Patient is on Treatment Plan : BREAST METASTATIC fam-trastuzumab deruxtecan-nxki (Enhertu) q21d     02/15/2022 - 05/10/2022 Chemotherapy   Patient is on Treatment Plan : BREAST METASTATIC Fam-Trastuzumab Deruxtecan-nxki (Enhertu) (5.4) q21d     06/07/2022 - 07/05/2022 Chemotherapy   Patient is on Treatment Plan : BREAST METASTATIC Eribulin D1,8 q21d  07/26/2022 - 07/26/2022 Chemotherapy   Patient is on Treatment Plan : HEAD/NECK Pembrolizumab (200) q21d     09/11/2022 -  Chemotherapy   Patient is on Treatment Plan : HEAD/NECK Pembrolizumab (200) q21d     09/11/2022 -  Chemotherapy   Patient is on Treatment Plan : BREAST Carboplatin + Gemcitabine D1,8 q21d     Neuropathy associated with cancer ([place])  11/21/2021 Initial Diagnosis   Neuropathy  associated with cancer ([place])      INTERVAL HISTORY:  Kristin Ward is here for a follow up of metastatic breast cancer    She was last seen by me on 09/11/2022 She presents to the clinic alone . Pt states he is having muscle spasm in her right leg. She states she is taking OxyContin  for pain and it does help.    All other systems were reviewed with the patient and are negative.  MEDICAL HISTORY:  Past Medical History:  Diagnosis Date   Abnormal x-ray of lungs with single pulmonary nodule 03/15/2016   per records from [place]    Anemia    Asthma    Cholelithiasis 05/19/2017   On CT   Coronary artery calcification seen on CAT scan 05/19/2017   Diabetes mellitus without complication ([place])    type 2   Diabetes mellitus, new onset ([place]) 01/20/2019   Dilated idiopathic cardiomyopathy ([place]) 12/2015   EF 15-20%. Diagnosed in [facility]   Headache    NONE RECENT   Hypertension    Lymph nodes enlarged 07/25/2020   Malignant neoplasm of lower-outer quadrant of left breast of female, estrogen receptor negative ([place]) 06/11/2017   left breast   Mixed hyperlipidemia 06/17/2018   partially blind    Personal history of chemotherapy 2018-2019   Personal history of radiation therapy 2019   Retinitis pigmentosa    Retroperitoneal lymphadenopathy 07/25/2020   Uveitis     SURGICAL HISTORY: Past Surgical History:  Procedure Laterality Date   brain cyst removed  2007   to help with headaches per patient , aspirated    BREAST BIOPSY Left 06/06/2017   x2   BREAST CYST ASPIRATION Left 06/06/2017   BREAST LUMPECTOMY Left 06/26/2017   BREAST LUMPECTOMY WITH RADIOACTIVE SEED AND SENTINEL LYMPH NODE BIOPSY Left 06/26/2017   Procedure: BREAST LUMPECTOMY WITH RADIOACTIVE SEED AND SENTINEL LYMPH NODE BIOPSY ERAS  PATHWAY;  Surgeon: [name], MD;  Location: [place];  Service: General;  Laterality: Left;  pec block   FINE NEEDLE ASPIRATION Left 02/23/2019   Procedure: FINE  NEEDLE ASPIRATION;  Surgeon: [name], MD;  Location: [place];  Service: General;  Laterality: Left;  Asiration of left breast seroma    IR IMAGING GUIDED PORT INSERTION  10/13/2020   NASAL TURBINATE REDUCTION     PORT-A-CATH REMOVAL Right 02/23/2019   Procedure: REMOVAL PORT-A-CATH;  Surgeon: [name], MD;  Location: [place];  Service: General;  Laterality: Right;   PORTACATH PLACEMENT N/A 06/26/2017   Procedure: INSERTION PORT-A-CATH;  Surgeon: [name], MD;  Location: [place];  Service: General;  Laterality: N/A;   RIGHT/LEFT HEART CATH AND CORONARY ANGIOGRAPHY N/A 07/31/2022   Procedure: RIGHT/LEFT HEART CATH AND CORONARY ANGIOGRAPHY;  Surgeon: Jolaine Artist, MD;  Location: [place] [facility];  Service: Cardiovascular;  Laterality: N/A;   TRANSTHORACIC ECHOCARDIOGRAM  12/2015   A) [facility] May 2017: Mild concentric LVH. Global hypokinesis. GR daily. EF 18% severe LA dilation. Mitral annular dilatation with papillary muscle dysfunction  and moderate MR. Dilated IVC consistent with elevated RAP.   TRANSTHORACIC ECHOCARDIOGRAM  05/2017; 08/2017   a) Mild concentric LVH.  EF 45 to 50% with diffuse hypokinesis.  GR 1 DD.  Mild aortic root dilation.;; b)  Mild LVH EF 45 to 50%.  Diffuse HK.  No significant valve disease.  No significant change.   TRANSTHORACIC ECHOCARDIOGRAM  01/30/2022   Pre-Enhertu:EF 35 to 40%.  Moderate dysfunction with global HK.  Abnormal strain.  Normal RV size and function.  Normal RVSP.  Aortic root estimated 42 mm.  Mild MR.  Mild/trivial AI.  Normal RVP.;  04/01/2022: Preliminary read 40%.  Formal read 45 to 50% (probably 40 to 45%).  Global HK.  GR 1 DD.  Normal RV size and RVP.  Mild MR.  Now measured at 38 mm.  Normal RAP.    I have reviewed the social history and family history with the patient and they are unchanged from previous note.  ALLERGIES:  is allergic to morphine and related, latex, tylenol  with codeine #3 [acetaminophen-codeine], and penicillins.  MEDICATIONS:  Current Outpatient Medications  Medication Sig Dispense Refill   Accu-Chek FastClix Lancets MISC TEST TWICE A DAY. PT USES AN ACCU-CHEK GUIDE ME METER 102 each 2   amLODipine (NORVASC) 5 MG tablet Take 5 mg by mouth daily as needed (high B/P).     Ascorbic Acid (VITAMIN C) 500 MG CAPS Take 500 mg by mouth daily.     aspirin 81 MG chewable tablet Chew 81 mg by mouth daily.     atorvastatin (LIPITOR) 40 MG tablet Take 1 tablet (40 mg total) by mouth daily. 90 tablet 3   brimonidine (ALPHAGAN) 0.2 % ophthalmic solution Place 1 drop into both eyes 2 (two) times daily.     carvedilol (COREG) 12.5 MG tablet TAKE 1 AND 1/2 TABLETS BY MOUTH TWICE DAILY qm 90 tablet 10   Cholecalciferol (VITAMIN D) 50 MCG (2000 UT) CAPS Take 2,000 Units by mouth daily.     empagliflozin (JARDIANCE) 10 MG TABS tablet Take 1 tablet (10 mg total) by mouth daily before breakfast. 90 tablet 3   furosemide (LASIX) 20 MG tablet TAKE 1-2 TABLETS BY MOUTH AS NEEDED FOR WEIGHT GAIN 3 TO 4 LBS OR SIGNIFICANT LEG EDEMA 180 tablet 1   gabapentin (NEURONTIN) 300 MG capsule Take 3 caps twice a day during the day and 4 caps at bedtime 300 capsule 1   HUMALOG KWIKPEN 100 UNIT/ML KwikPen Inject 3-10 Units into the skin 3 (three) times daily before meals. Per sliding scale     ibuprofen (ADVIL) 800 MG tablet Take 1 tablet (800 mg total) by mouth every 8 (eight) hours as needed. 30 tablet 2   Lancets Misc. (ACCU-CHEK SOFTCLIX LANCET DEV) KIT 1 Units by Does not apply route 2 (two) times daily. Check FSBS BID - Include strips # 50 with 5 refills, Lancets #50 with 5 refills DX: Type 2 DM - ICD 10: E11.9 1 kit 0   lidocaine-prilocaine (EMLA) cream Apply 1 application topically as needed. 30 g 0   linaclotide (LINZESS) 72 MCG capsule Take 1 capsule (72 mcg total) by mouth daily as needed (constipation). 30 capsule 2   magic mouthwash (nystatin, lidocaine, diphenhydrAMINE,  alum & mag hydroxide) suspension Swish and swallow 5 mLs by mouth 4 (four) times daily as needed for mouth pain 140 mL 0   Multiple Vitamins-Minerals (WOMENS MULTI PO) Take 15 mLs by mouth daily.     ondansetron (ZOFRAN) 8  MG tablet Take 1 tablet (8 mg total) by mouth every 8 (eight) hours as needed for nausea or vomiting (begin on day 3 after chemo). 30 tablet 1   oxyCODONE (OXY IR/ROXICODONE) 5 MG immediate release tablet Take 1 tablet (5 mg total) by mouth every 6 (six) hours as needed for severe pain. 60 tablet 0   pantoprazole (PROTONIX) 40 MG tablet TAKE 1 TABLET BY MOUTH EVERY DAY 90 tablet 1   prochlorperazine (COMPAZINE) 10 MG tablet Take 1 tablet (10 mg total) by mouth every 6 (six) hours as needed (Nausea or vomiting). 30 tablet 1   promethazine (PHENERGAN) 25 MG tablet Take 1 tablet (25 mg total) by mouth every 6 (six) hours as needed for nausea or vomiting. 30 tablet 0   sacubitril-valsartan (ENTRESTO) 97-103 MG Take 1 tablet by mouth 2 (two) times daily. 60 tablet 10   spironolactone (ALDACTONE) 25 MG tablet Take 1 tablet (25 mg total) by mouth daily. 30 tablet 10   vitamin B-12 (CYANOCOBALAMIN) 1000 MCG tablet Take 1,000 mcg by mouth daily.     zinc gluconate 50 MG tablet Take 50 mg by mouth daily.     Current Facility-Administered Medications  Medication Dose Route Frequency Provider Last Rate Last Admin   insulin glulisine (APIDRA) injection 6 Units  6 Units Subcutaneous Once Williams, Lynne B, PA-C       Facility-Administered Medications Ordered in Other Visits  Medication Dose Route Frequency Provider Last Rate Last Admin   0.9 %  sodium chloride infusion   Intravenous Continuous [name], MD 500 mL/hr at 09/18/22 1138 New Bag at 09/18/22 1138    PHYSICAL EXAMINATION: ECOG PERFORMANCE STATUS: 3 - Symptomatic, >50% confined to bed  Vitals:   09/18/22 1116  BP: 110/78  Pulse: 93  Resp: 18  Temp: 98.7 F (37.1 C)  SpO2: 100%   Wt Readings from Last 3 Encounters:   09/11/22 225 lb 4 oz (102.2 kg)  08/14/22 220 lb 9.6 oz (100.1 kg)  08/09/22 233 lb (105.7 kg)     GENERAL:alert, no distress and comfortable SKIN: skin color normal, no rashes or significant lesions EYES: normal, Conjunctiva are pink and non-injected, sclera clear  NEURO: alert & oriented x 3 with fluent speech HEART: regular rate & rhythm and no murmurs and no  (+) Rt leg lower extremity edema   LABORATORY DATA:  I have reviewed the data as listed    Latest Ref Rng & Units 09/18/2022   10:45 AM 09/11/2022   11:36 AM 08/14/2022   11:16 AM  CBC  WBC 4.0 - 10.5 K/uL 2.8  6.3  4.0   Hemoglobin 12.0 - 15.0 g/dL 10.6  11.0  10.7   Hematocrit 36.0 - 46.0 % 31.8  33.4  32.2   Platelets 150 - 400 K/uL 162  303  269         Latest Ref Rng & Units 09/18/2022   10:45 AM 09/11/2022   11:36 AM 08/14/2022   11:16 AM  CMP  Glucose 70 - 99 mg/dL 96  107  99   BUN 8 - 23 mg/dL '17  18  15   '$ Creatinine 0.44 - 1.00 mg/dL 1.15  1.22  0.69   Sodium 135 - 145 mmol/L 140  139  140   Potassium 3.5 - 5.1 mmol/L 4.6  4.1  3.9   Chloride 98 - 111 mmol/L 109  107  108   CO2 22 - 32 mmol/L 25  28  27  Calcium 8.9 - 10.3 mg/dL 8.9  9.1  9.6   Total Protein 6.5 - 8.1 g/dL 6.5  7.0  6.9   Total Bilirubin 0.3 - 1.2 mg/dL 0.5  0.6  0.7   Alkaline Phos 38 - 126 U/L 89  81  77   AST 15 - 41 U/L '26  20  22   '$ ALT 0 - 44 U/L '14  9  12       '$ RADIOGRAPHIC STUDIES: I have personally reviewed the radiological images as listed and agreed with the findings in the report. No results found.    No orders of the defined types were placed in this encounter.  All questions were answered. The patient knows to call the clinic with any problems, questions or concerns. No barriers to learning was detected. The total time spent in the appointment was 40 minutes.     [name], MD 09/18/2022   Felicity Coyer, CMA, am acting as scribe for [name], MD.   I have reviewed the above documentation for accuracy and  completeness, and I agree with the above.

## 2022-09-19 ENCOUNTER — Other Ambulatory Visit: Payer: Self-pay | Admitting: Hematology

## 2022-09-19 ENCOUNTER — Ambulatory Visit: Payer: 59 | Admitting: Physical Therapy

## 2022-09-19 DIAGNOSIS — R52 Pain, unspecified: Secondary | ICD-10-CM | POA: Diagnosis not present

## 2022-09-19 LAB — CBC
HCT: 29.4 % — ABNORMAL LOW (ref 36.0–46.0)
Hemoglobin: 9.6 g/dL — ABNORMAL LOW (ref 12.0–15.0)
MCH: 30.5 pg (ref 26.0–34.0)
MCHC: 32.7 g/dL (ref 30.0–36.0)
MCV: 93.3 fL (ref 80.0–100.0)
Platelets: 138 10*3/uL — ABNORMAL LOW (ref 150–400)
RBC: 3.15 MIL/uL — ABNORMAL LOW (ref 3.87–5.11)
RDW: 14.3 % (ref 11.5–15.5)
WBC: 3.3 10*3/uL — ABNORMAL LOW (ref 4.0–10.5)
nRBC: 0 % (ref 0.0–0.2)

## 2022-09-19 LAB — HEMOGLOBIN A1C
Hgb A1c MFr Bld: 5.9 % — ABNORMAL HIGH (ref 4.8–5.6)
Mean Plasma Glucose: 122.63 mg/dL

## 2022-09-19 LAB — COMPREHENSIVE METABOLIC PANEL
ALT: 17 U/L (ref 0–44)
AST: 27 U/L (ref 15–41)
Albumin: 2.9 g/dL — ABNORMAL LOW (ref 3.5–5.0)
Alkaline Phosphatase: 76 U/L (ref 38–126)
Anion gap: 5 (ref 5–15)
BUN: 16 mg/dL (ref 8–23)
CO2: 21 mmol/L — ABNORMAL LOW (ref 22–32)
Calcium: 8.2 mg/dL — ABNORMAL LOW (ref 8.9–10.3)
Chloride: 108 mmol/L (ref 98–111)
Creatinine, Ser: 1.29 mg/dL — ABNORMAL HIGH (ref 0.44–1.00)
GFR, Estimated: 46 mL/min — ABNORMAL LOW (ref >60)
Glucose, Bld: 85 mg/dL (ref 70–99)
Potassium: 4.1 mmol/L (ref 3.5–5.1)
Sodium: 134 mmol/L — ABNORMAL LOW (ref 135–145)
Total Bilirubin: 0.6 mg/dL (ref 0.3–1.2)
Total Protein: 6.6 g/dL (ref 6.5–8.1)

## 2022-09-19 LAB — GLUCOSE, CAPILLARY: Glucose-Capillary: 90 mg/dL (ref 70–99)

## 2022-09-19 LAB — HIV ANTIBODY (ROUTINE TESTING W REFLEX): HIV Screen 4th Generation wRfx: NONREACTIVE

## 2022-09-19 MED ORDER — OXYCODONE HCL ER 10 MG PO T12A
10.0000 mg | EXTENDED_RELEASE_TABLET | Freq: Two times a day (BID) | ORAL | Status: DC
  Administered 2022-09-19 – 2022-09-24 (×11): 10 mg via ORAL
  Filled 2022-09-19 (×11): qty 1

## 2022-09-19 MED ORDER — CHLORHEXIDINE GLUCONATE CLOTH 2 % EX PADS
6.0000 | MEDICATED_PAD | Freq: Every day | CUTANEOUS | Status: DC
  Administered 2022-09-19 – 2022-09-23 (×5): 6 via TOPICAL

## 2022-09-19 MED ORDER — METHOCARBAMOL 500 MG PO TABS
500.0000 mg | ORAL_TABLET | Freq: Three times a day (TID) | ORAL | Status: DC | PRN
  Administered 2022-09-19: 500 mg via ORAL
  Filled 2022-09-19: qty 1

## 2022-09-19 MED ORDER — METHOCARBAMOL 1000 MG/10ML IJ SOLN
1000.0000 mg | Freq: Three times a day (TID) | INTRAVENOUS | Status: DC | PRN
  Administered 2022-09-21 – 2022-09-22 (×3): 1000 mg via INTRAVENOUS
  Filled 2022-09-19 (×4): qty 10
  Filled 2022-09-19: qty 1000
  Filled 2022-09-19: qty 10

## 2022-09-19 NOTE — Consult Note (Signed)
Chief Complaint: Patient was seen in consultation today for right lower extremity edema.   Referring Physician(s): Feng,Yan  Supervising Physician: Corrie Mckusick  Patient Status: [facility] - In-pt  History of Present Illness: Martell Racca is a 67 y.o. female with past medical history of pulmonary nodule, anemia, asthma, cholelithiasis, CAD, DM2, CHF, HTN, left breast cancer undergoing chemotherapy who was sent to [facility] ED from the cancer center due to progressive right lower extremity edema/swelling.  Patient has experienced generalized, worsening swelling in the right lower extremity over the past several weeks. She is was able to ambulate comfortably despite the swelling until December.  She is able to perform basic care needs with assistance from a walk, however does have limited ability to participate in ADLs otherwise.  Her children provide support at home.   IR consulted for venogram with possible intervention.  Case reviewed by Dr. Earleen Newport who has met with patient at bedside.  As it stands imaging is not diagnostic of the exact location or cause of the narrowing although there is clearly defect along the right posterior bladder with compression of the distal right ureter as well as advancing/enlarging lymphadenopathy.  Patient assessed at bedside alongside Dr. Earleen Newport and Dr. Burr Medico.  She does have generalized pitting edema into the thigh.  She reports "charlie horses" in the thigh and calf.   Past Medical History:  Diagnosis Date   Abnormal x-ray of lungs with single pulmonary nodule 03/15/2016   per records from [place]    Anemia    Asthma    Cholelithiasis 05/19/2017   On CT   Coronary artery calcification seen on CAT scan 05/19/2017   Diabetes mellitus without complication ([place])    type 2   Diabetes mellitus, new onset ([place]) 01/20/2019   Dilated idiopathic cardiomyopathy ([place]) 12/2015   EF 15-20%. Diagnosed in [facility]   Headache    NONE RECENT    Hypertension    Lymph nodes enlarged 07/25/2020   Malignant neoplasm of lower-outer quadrant of left breast of female, estrogen receptor negative ([place]) 06/11/2017   left breast   Mixed hyperlipidemia 06/17/2018   partially blind    Personal history of chemotherapy 2018-2019   Personal history of radiation therapy 2019   Retinitis pigmentosa    Retroperitoneal lymphadenopathy 07/25/2020   Uveitis     Past Surgical History:  Procedure Laterality Date   brain cyst removed  2007   to help with headaches per patient , aspirated    BREAST BIOPSY Left 06/06/2017   x2   BREAST CYST ASPIRATION Left 06/06/2017   BREAST LUMPECTOMY Left 06/26/2017   BREAST LUMPECTOMY WITH RADIOACTIVE SEED AND SENTINEL LYMPH NODE BIOPSY Left 06/26/2017   Procedure: BREAST LUMPECTOMY WITH RADIOACTIVE SEED AND SENTINEL LYMPH NODE BIOPSY ERAS  PATHWAY;  Surgeon: [name], MD;  Location: [place];  Service: General;  Laterality: Left;  pec block   FINE NEEDLE ASPIRATION Left 02/23/2019   Procedure: FINE NEEDLE ASPIRATION;  Surgeon: [name], MD;  Location: [place];  Service: General;  Laterality: Left;  Asiration of left breast seroma    IR IMAGING GUIDED PORT INSERTION  10/13/2020   NASAL TURBINATE REDUCTION     PORT-A-CATH REMOVAL Right 02/23/2019   Procedure: REMOVAL PORT-A-CATH;  Surgeon: [name], MD;  Location: [place];  Service: General;  Laterality: Right;   PORTACATH PLACEMENT N/A 06/26/2017   Procedure: INSERTION PORT-A-CATH;  Surgeon: [name], MD;  Location: [facility]  OR;  Service: General;  Laterality: N/A;   RIGHT/LEFT HEART CATH AND CORONARY ANGIOGRAPHY N/A 07/31/2022   Procedure: RIGHT/LEFT HEART CATH AND CORONARY ANGIOGRAPHY;  Surgeon: Jolaine Artist, MD;  Location: [place] [facility];  Service: Cardiovascular;  Laterality: N/A;   TRANSTHORACIC ECHOCARDIOGRAM  12/2015   A) [facility] May 2017: Mild concentric LVH. Global hypokinesis. GR  daily. EF 18% severe LA dilation. Mitral annular dilatation with papillary muscle dysfunction and moderate MR. Dilated IVC consistent with elevated RAP.   TRANSTHORACIC ECHOCARDIOGRAM  05/2017; 08/2017   a) Mild concentric LVH.  EF 45 to 50% with diffuse hypokinesis.  GR 1 DD.  Mild aortic root dilation.;; b)  Mild LVH EF 45 to 50%.  Diffuse HK.  No significant valve disease.  No significant change.   TRANSTHORACIC ECHOCARDIOGRAM  01/30/2022   Pre-[place]:EF 35 to 40%.  Moderate dysfunction with global HK.  Abnormal strain.  Normal RV size and function.  Normal RVSP.  Aortic root estimated 42 mm.  Mild MR.  Mild/trivial AI.  Normal RVP.;  04/01/2022: Preliminary read 40%.  Formal read 45 to 50% (probably 40 to 45%).  Global HK.  GR 1 DD.  Normal RV size and RVP.  Mild MR.  Now measured at 38 mm.  Normal RAP.    Allergies: Morphine and related, Latex, Tylenol with codeine #3 [acetaminophen-codeine], and Penicillins  Medications: Prior to Admission medications   Medication Sig Start Date End Date Taking? Authorizing Provider  amLODipine (NORVASC) 5 MG tablet Take 5 mg by mouth daily as needed (high B/P).   Yes [provider]  Ascorbic Acid (VITAMIN C) 500 MG CAPS Take 500 mg by mouth daily.   Yes [provider]  aspirin 81 MG chewable tablet Chew 81 mg by mouth daily.   Yes [provider]  atorvastatin (LIPITOR) 40 MG tablet Take 1 tablet (40 mg total) by mouth daily. 05/11/22  Yes [name], MD  brimonidine (ALPHAGAN) 0.2 % ophthalmic solution Place 1 drop into both eyes 2 (two) times daily. 09/19/21  Yes [provider]  carvedilol (COREG) 12.5 MG tablet TAKE 1 AND 1/2 TABLETS BY MOUTH TWICE DAILY qm Patient taking differently: Take 18.75 mg by mouth 2 (two) times daily with a meal. 06/07/22  Yes [name], MD  Cholecalciferol (VITAMIN D) 50 MCG (2000 UT) CAPS Take 2,000 Units by mouth daily.   Yes [provider]  empagliflozin (JARDIANCE)  10 MG TABS tablet Take 1 tablet (10 mg total) by mouth daily before breakfast. 07/01/22  Yes [name], [name], MD  gabapentin (NEURONTIN) 300 MG capsule Take 3 caps twice a day during the day and 4 caps at bedtime Patient taking differently: Take 600-900 mg by mouth See admin instructions. Take 2 caps ( 600 mg)  twice a day during the day and 3 caps ( 900 mg)  at bedtime 09/12/22  Yes [name], MD  ibuprofen (ADVIL) 800 MG tablet Take 1 tablet (800 mg total) by mouth every 8 (eight) hours as needed. Patient taking differently: Take 800 mg by mouth every 8 (eight) hours as needed for mild pain. 09/07/21  Yes [name], MD  lidocaine-prilocaine (EMLA) cream Apply 1 application topically as needed. 10/23/20  Yes [name], MD  linaclotide [facility]) 72 MCG capsule Take 1 capsule (72 mcg total) by mouth daily as needed (constipation). 08/28/22  Yes Pickenpack-Cousar, Carlena Sax, NP  magic mouthwash (nystatin, lidocaine, diphenhydrAMINE, alum & mag hydroxide) suspension Swish and swallow  5 mLs by mouth 4 (four) times daily as needed for mouth pain 06/20/22  Yes [name], MD  Magnesium 300 MG CAPS Take 300 mg by mouth 2 (two) times a week.   Yes [provider]  Multiple Vitamins-Minerals (WOMENS MULTI PO) Take 15 mLs by mouth daily.   Yes [provider]  ondansetron (ZOFRAN) 8 MG tablet Take 1 tablet (8 mg total) by mouth every 8 (eight) hours as needed for nausea or vomiting (begin on day 3 after chemo). 12/21/21  Yes [name], MD  oxyCODONE (OXY IR/ROXICODONE) 5 MG immediate release tablet Take 1 tablet (5 mg total) by mouth every 6 (six) hours as needed for severe pain. 08/28/22  Yes Pickenpack-Cousar, Carlena Sax, NP  pantoprazole (PROTONIX) 40 MG tablet TAKE 1 TABLET BY MOUTH EVERY DAY Patient taking differently: Take 40 mg by mouth daily as needed (heartburn). 12/06/21  Yes [name], MD  prochlorperazine (COMPAZINE) 10 MG tablet Take 1 tablet (10 mg total) by mouth every 6 (six) hours as  needed (Nausea or vomiting). 02/15/22  Yes [name], MD  promethazine (PHENERGAN) 25 MG tablet Take 1 tablet (25 mg total) by mouth every 6 (six) hours as needed for nausea or vomiting. 03/08/22  Yes [name], MD  sacubitril-valsartan (ENTRESTO) 97-103 MG Take 1 tablet by mouth 2 (two) times daily. 04/19/22  Yes [name], MD  spironolactone (ALDACTONE) 25 MG tablet Take 1 tablet (25 mg total) by mouth daily. 04/19/22  Yes [name], MD  vitamin B-12 (CYANOCOBALAMIN) 1000 MCG tablet Take 1,000 mcg by mouth daily.   Yes [provider]  zinc gluconate 50 MG tablet Take 50 mg by mouth daily.   Yes [provider]  Accu-Chek FastClix Lancets MISC TEST TWICE A DAY. PT USES AN ACCU-CHEK GUIDE ME METER 01/29/21   Henson, Vickie L, NP-C  furosemide (LASIX) 20 MG tablet TAKE 1-2 TABLETS BY MOUTH AS NEEDED FOR WEIGHT GAIN 3 TO 4 LBS OR SIGNIFICANT LEG EDEMA Patient not taking: Reported on 09/19/2022 09/06/22   [name], MD  HUMALOG KWIKPEN 100 UNIT/ML KwikPen Inject 3-10 Units into the skin 3 (three) times daily before meals. Per sliding scale Patient not taking: Reported on 09/19/2022 11/23/21   [provider]  Lancets Misc. (ACCU-CHEK SOFTCLIX LANCET DEV) KIT 1 Units by Does not apply route 2 (two) times daily. Check FSBS BID - Include strips # 50 with 5 refills, Lancets #50 with 5 refills DX: Type 2 DM - ICD 10: E11.9 10/12/21   Irene Pap, PA-C     Family History  Problem Relation Age of Onset   Colon cancer Mother 48       deceased 85   Heart attack Father    Breast cancer Other 34       2nd cousin on maternal side; currently 36   Pancreatic cancer Other        2nd cousin once removed on maternal side; deceased 57s   Fibromyalgia Sister    Esophageal cancer Neg Hx    Rectal cancer Neg Hx    Stomach cancer Neg Hx     Social History   Socioeconomic History   Marital status: Widowed    Spouse name: Not on file   Number of children: 2   Years of  education: Not on file   Highest education level: Not on file  Occupational History   Not on file  Tobacco Use   Smoking status: Former  Packs/day: 0.25    Years: 23.00    Total pack years: 5.75    Types: Cigarettes    Quit date: 10/09/2015    Years since quitting: 6.9   Smokeless tobacco: Never  Vaping Use   Vaping Use: Never used  Substance and Sexual Activity   Alcohol use: No   Drug use: No   Sexual activity: Never  Other Topics Concern   Not on file  Social History Narrative   She is a widowed mother of 2 (daughter and son).   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   She moved to [place] apparently back in 2016, but was visiting family in [place] and was diagnosed with Cardiomyopathy-EF 18%.   She is usually accompanied by her daughter.    At baseline, she walks roughly 1-2 miles a day.   Social Determinants of Health   Financial Resource Strain: Low Risk  (08/09/2022)   Overall Financial Resource Strain (CARDIA)    Difficulty of Paying Living Expenses: Not hard at all  Food Insecurity: No Food Insecurity (09/18/2022)   Hunger Vital Sign    Worried About Running Out of Food in the Last Year: Never true    Ran Out of Food in the Last Year: Never true  Transportation Needs: No Transportation Needs (09/18/2022)   PRAPARE - Hydrologist (Medical): No    Lack of Transportation (Non-Medical): No  Physical Activity: Sufficiently Active (08/09/2022)   Exercise Vital Sign    Days of Exercise per Week: 5 days    Minutes of Exercise per Session: 60 min  Stress: No Stress Concern Present (08/09/2022)   [place]    Feeling of Stress : Not at all  Social Connections: Unknown (09/01/2020)   Social Connection and Isolation Panel [NHANES]    Frequency of Communication with Friends and Family: More than three times a week    Frequency of Social Gatherings with Friends and  Family: More than three times a week    Attends Religious Services: More than 4 times per year    Active Member of [employer] or Organizations: No    Attends Archivist Meetings: Never    Marital Status: Not on file     Review of Systems: A 12 point ROS discussed and pertinent positives are indicated in the HPI above.  All other systems are negative.  Review of Systems  Constitutional:  Negative for fatigue and fever.  Respiratory:  Negative for cough and shortness of breath.   Cardiovascular:  Negative for chest pain.  Gastrointestinal:  Negative for abdominal pain, nausea and vomiting.  Musculoskeletal:  Negative for back pain.  Psychiatric/Behavioral:  Negative for behavioral problems and confusion.     Vital Signs: BP 113/78 (BP Location: Right Arm)   Pulse 90   Temp 98.4 F (36.9 C) (Oral)   Resp 18   SpO2 99%   Physical Exam Vitals and nursing note reviewed.  Constitutional:      General: She is not in acute distress.    Appearance: Normal appearance. She is not ill-appearing.  HENT:     Mouth/Throat:     Mouth: Mucous membranes are moist.     Pharynx: Oropharynx is clear.  Cardiovascular:     Rate and Rhythm: Normal rate and regular rhythm.  Pulmonary:     Effort: Pulmonary effort is normal. No respiratory distress.     Breath sounds: Normal breath  sounds.  Musculoskeletal:        General: Swelling present.     Comments: R lower leg with generalized swelling, pitting edema.  Compressible. Non-tender. No weeping.   Skin:    General: Skin is warm and dry.  Neurological:     General: No focal deficit present.     Mental Status: She is alert and oriented to person, place, and time. Mental status is at baseline.  Psychiatric:        Mood and Affect: Mood normal.        Behavior: Behavior normal.        Thought Content: Thought content normal.        Judgment: Judgment normal.      MD Evaluation Airway: WNL Heart: WNL Abdomen: WNL Chest/ Lungs:  WNL ASA  Classification: 3 Mallampati/Airway Score: Two   Imaging: CT ABDOMEN PELVIS W CONTRAST  Result Date: 09/18/2022 CLINICAL DATA:  Metastatic disease evaluation.  Breast cancer. EXAM: CT ABDOMEN AND PELVIS WITH CONTRAST TECHNIQUE: Multidetector CT imaging of the abdomen and pelvis was performed using the standard protocol following bolus administration of intravenous contrast. RADIATION DOSE REDUCTION: This exam was performed according to the departmental dose-optimization program which includes automated exposure control, adjustment of the mA and/or kV according to patient size and/or use of iterative reconstruction technique. CONTRAST:  15m OMNIPAQUE IOHEXOL 300 MG/ML  SOLN COMPARISON:  CT of the chest abdomen pelvis dated 08/13/2022. FINDINGS: Lower chest: Minimal right lung base linear atelectasis/scarring. No intra-abdominal free air.  Small free fluid within the pelvis. Hepatobiliary: The liver is unremarkable. No biliary dilatation there is stone within the gallbladder. No pericholecystic fluid or evidence of acute cholecystitis by CT. Pancreas: Unremarkable. No pancreatic ductal dilatation or surrounding inflammatory changes. Spleen: Normal in size without focal abnormality. Adrenals/Urinary Tract: The adrenal glands are unremarkable. Interval development of mild right hydronephrosis and mild right hydroureter. No obstructing stone identified. Findings likely related to compression of the distal right ureter within the pelvis. There is no hydronephrosis or nephrolithiasis on the left. The urinary bladder is mildly distended. There is focal area of thickened appearance of the right posterior bladder dome which is suboptimally evaluated. A bladder lesion is not excluded further evaluation with cystoscopy is recommended. There is diffuse bladder wall haziness and edema. Correlation with urinalysis recommended to exclude cystitis. Stomach/Bowel: There is no bowel obstruction or active inflammation.  The appendix is normal. Vascular/Lymphatic: Mild aortoiliac atherosclerotic disease. The IVC is unremarkable. There is retroperitoneal adenopathy similar to prior CT. No portal venous gas. Reproductive: Enlarged myomatous uterus. There is fluid distended endometrium concerning for obstruction or stricture of the endocervical canal. Other: There is extensive edema of the right lower extremity likely related to compression of the right iliac veins within the pelvis. This has significantly progressed since the prior CT. Musculoskeletal: Similar appearance of treated right iliac treated metastatic disease. No acute osseous pathology. A 3 x 4 cm fluid density with fat fluid level in the left breast similar to prior CT, likely a postsurgical or posttreatment changes. IMPRESSION: 1. Interval development of mild right hydronephrosis likely related to compression of the distal right ureter within the pelvis. 2. Extensive edema in the right lower extremity likely related to compression of the right iliac veins in the pelvis. 3. Focal area of thickened appearance of the right posterior bladder. Further evaluation with cystoscopy is recommended. 4. Enlarged myomatous uterus with fluid distended endometrium concerning for obstruction or stricture of the endocervical canal. 5. Retroperitoneal adenopathy  similar to prior CT. 6. Cholelithiasis. 7. Similar appearance of treated right iliac metastatic disease. 8.  Aortic Atherosclerosis (ICD10-I70.0). Electronically Signed   By: Anner Crete M.D.   On: 09/18/2022 23:24   VAS [place] LOWER EXTREMITY VENOUS (DVT)  Result Date: 09/18/2022  Lower Venous DVT Study Patient Name:  Kristin Ward  Date of Exam:   09/18/2022 Medical Rec #: [id number]            Accession #:    [id number] Date of Birth: 1955/11/14           Patient Gender: F Patient Age:   30 years Exam Location:  [facility] Procedure:      VAS [place] LOWER EXTREMITY VENOUS (DVT) Referring Phys: DAVID ORTIZ  --------------------------------------------------------------------------------  Indications: Edema.  Risk Factors: Cancer. Limitations: Body habitus, poor ultrasound/tissue interface, bandages and patient positioning, patient pain tolerance, patient movement. Comparison Study: No prior studies. Performing Technologist: Oliver Hum RVT  Examination Guidelines: A complete evaluation includes B-mode imaging, spectral Doppler, color Doppler, and power Doppler as needed of all accessible portions of each vessel. Bilateral testing is considered an integral part of a complete examination. Limited examinations for reoccurring indications may be performed as noted. The reflux portion of the exam is performed with the patient in reverse Trendelenburg.  +---------+---------------+---------+-----------+----------+-------------------+ RIGHT    CompressibilityPhasicitySpontaneityPropertiesThrombus Aging      +---------+---------------+---------+-----------+----------+-------------------+ CFV                     Yes      Yes                                      +---------+---------------+---------+-----------+----------+-------------------+ FV Prox                 Yes      Yes                                      +---------+---------------+---------+-----------+----------+-------------------+ FV Mid                  Yes      Yes                                      +---------+---------------+---------+-----------+----------+-------------------+ FV Distal               Yes      Yes                                      +---------+---------------+---------+-----------+----------+-------------------+ POP      Full           Yes      Yes                                      +---------+---------------+---------+-----------+----------+-------------------+ PTV      Full                                                              +---------+---------------+---------+-----------+----------+-------------------+  PERO                                                  Not well visualized +---------+---------------+---------+-----------+----------+-------------------+   +---------+---------------+---------+-----------+----------+--------------+ LEFT     CompressibilityPhasicitySpontaneityPropertiesThrombus Aging +---------+---------------+---------+-----------+----------+--------------+ CFV      Full           Yes      Yes                                 +---------+---------------+---------+-----------+----------+--------------+ SFJ      Full                                                        +---------+---------------+---------+-----------+----------+--------------+ FV Prox  Full                                                        +---------+---------------+---------+-----------+----------+--------------+ FV Mid   Full                                                        +---------+---------------+---------+-----------+----------+--------------+ FV DistalFull                                                        +---------+---------------+---------+-----------+----------+--------------+ PFV      Full                                                        +---------+---------------+---------+-----------+----------+--------------+ POP      Full           Yes      Yes                                 +---------+---------------+---------+-----------+----------+--------------+ PTV      Full                                                        +---------+---------------+---------+-----------+----------+--------------+ PERO     Full                                                        +---------+---------------+---------+-----------+----------+--------------+  Summary: RIGHT: - There is no evidence of deep vein thrombosis in the lower extremity. However, portions  of this examination were limited- see technologist comments above.  - No cystic structure found in the popliteal fossa.  LEFT: - There is no evidence of deep vein thrombosis in the lower extremity.  - No cystic structure found in the popliteal fossa.  *See table(s) above for measurements and observations.    Preliminary     Labs:  CBC: Recent Labs    08/14/22 1116 09/11/22 1136 09/18/22 1045 09/19/22 0528  WBC 4.0 6.3 2.8* 3.3*  HGB 10.7* 11.0* 10.6* 9.6*  HCT 32.2* 33.4* 31.8* 29.4*  PLT 269 303 162 138*    COAGS: No results for input(s): "INR", "APTT" in the last 8760 hours.  BMP: Recent Labs    08/14/22 1116 09/11/22 1136 09/18/22 1045 09/19/22 0528  NA 140 139 140 134*  K 3.9 4.1 4.6 4.1  CL 108 107 109 108  CO2 27 28 25 $ 21*  GLUCOSE 99 107* 96 85  BUN 15 18 17 16  $ CALCIUM 9.6 9.1 8.9 8.2*  CREATININE 0.69 1.22* 1.15* 1.29*  GFRNONAA >60 50* 53* 46*    LIVER FUNCTION TESTS: Recent Labs    08/14/22 1116 09/11/22 1136 09/18/22 1045 09/19/22 0528  BILITOT 0.7 0.6 0.5 0.6  AST 22 20 26 27  $ ALT 12 9 14 17  $ ALKPHOS 77 81 89 76  PROT 6.9 7.0 6.5 6.6  ALBUMIN 3.8 3.4* 3.3* 2.9*    TUMOR MARKERS: No results for input(s): "AFPTM", "CEA", "CA199", "CHROMGRNA" in the last 8760 hours.  Assessment and Plan: Right lower extremity pain with edema CT imaging shows iliac vein occlusion with multiple additional concerning features suggestive of progressing disease in the lower abdomen and pelvic characterized by retroperitoneal lymphadenopathy with mass effect of the metastatic cancer in the right pelvis/groin with new posterior bladder abnormality, distal ureter abnormality, and mild right hydronephrosis.  IR consulted for assessment and intervention by Dr. Burr Medico. Patient assessed at bedside alongside Dr. Earleen Newport.  She expresses interest in procedures that would improve her pain and quality of life.   Feasibilty of diagnostic angiogram with IVUS possible angioplasty/stent  discussed.  Patient is agreeable.  IR not able to accommodate procedure tomorrow.  Will reassess early next week.  If she remains inpatient through the weekend we will plan to proceed as early as Monday schedule permitting.  Patient indicates interest in proceeding, but also states she would like to research procedure.  No consent signed at this time.   Thank you for this interesting consult.  I greatly enjoyed meeting Brynlee [place]dorf Brucker and look forward to participating in their care.  A copy of this report was sent to the requesting provider on this date.  Electronically Signed: Docia Barrier, PA 09/19/2022, 10:44 AM   I spent a total of 40 Minutes    in face to face in clinical consultation, greater than 50% of which was counseling/coordinating care for right lower leg edema.

## 2022-09-19 NOTE — Progress Notes (Signed)
[name]   DOB:[birth year]   GD#:[id number]   [id number]#:[id number]  Hem/onc follow up   Subjective: Patient is well-known to me, under my care for her metastatic breast cancer.  She was admitted from my office directly for severe right lower extremity edema with pain.    Objective:  Vitals:   09/19/22 1327 09/19/22 2023  BP: 102/63 (!) 92/58  Pulse: 84 80  Resp: 17 18  Temp: 98.2 F (36.8 C) 98.2 F (36.8 C)  SpO2: 94% 97%    There is no height or weight on file to calculate BMI.  Intake/Output Summary (Last 24 hours) at 09/19/2022 2146 Last data filed at 09/19/2022 1329 Gross per 24 hour  Intake 474 ml  Output --  Net 474 ml     Sclerae unicteric  Oropharynx clear  No peripheral adenopathy  Lungs clear -- no rales or rhonchi  Heart regular rate and rhythm  Abdomen benign  MSK no focal spinal tenderness, (+) significant right lower extremity edema with soft tissue firmness up to right groin  Neuro nonfocal    CBG (last 3)  Recent Labs    09/18/22 2101 09/19/22 0731  GLUCAP 95 90     Labs:  Urine Studies No results for input(s): "UHGB", "CRYS" in the last 72 hours.  Invalid input(s): "UACOL", "UAPR", "USPG", "UPH", "UTP", "UGL", "UKET", "UBIL", "UNIT", "UROB", "ULEU", "UEPI", "UWBC", "URBC", "UBAC", "CAST", "UCOM", "BILUA"  Basic Metabolic Panel: Recent Labs  Lab 09/18/22 1045 09/19/22 0528  NA 140 134*  K 4.6 4.1  CL 109 108  CO2 25 21*  GLUCOSE 96 85  BUN 17 16  CREATININE 1.15* 1.29*  CALCIUM 8.9 8.2*  MG 2.0  --    GFR Estimated Creatinine Clearance: 54.4 mL/min (A) (by C-G formula based on SCr of 1.29 mg/dL (H)). Liver Function Tests: Recent Labs  Lab 09/18/22 1045 09/19/22 0528  AST 26 27  ALT 14 17  ALKPHOS 89 76  BILITOT 0.5 0.6  PROT 6.5 6.6  ALBUMIN 3.3* 2.9*   No results for input(s): "LIPASE", "AMYLASE" in the last 168 hours. No results for input(s): "AMMONIA" in the last 168 hours. Coagulation profile No results for  input(s): "INR", "PROTIME" in the last 168 hours.  CBC: Recent Labs  Lab 09/18/22 1045 09/19/22 0528  WBC 2.8* 3.3*  NEUTROABS 1.9  --   HGB 10.6* 9.6*  HCT 31.8* 29.4*  MCV 90.9 93.3  PLT 162 138*   Cardiac Enzymes: No results for input(s): "CKTOTAL", "CKMB", "CKMBINDEX", "TROPONINI" in the last 168 hours. BNP: Invalid input(s): "POCBNP" CBG: Recent Labs  Lab 09/18/22 2101 09/19/22 0731  GLUCAP 95 90   D-Dimer No results for input(s): "DDIMER" in the last 72 hours. Hgb A1c Recent Labs    09/19/22 0528  HGBA1C 5.9*   Lipid Profile No results for input(s): "CHOL", "HDL", "LDLCALC", "TRIG", "CHOLHDL", "LDLDIRECT" in the last 72 hours. Thyroid function studies No results for input(s): "TSH", "T4TOTAL", "T3FREE", "THYROIDAB" in the last 72 hours.  Invalid input(s): "FREET3" Anemia work up No results for input(s): "VITAMINB12", "FOLATE", "FERRITIN", "TIBC", "IRON", "RETICCTPCT" in the last 72 hours. Microbiology No results found for this or any previous visit (from the past 240 hour(s)).    Studies:  VAS [place] LOWER EXTREMITY VENOUS (DVT)  Result Date: 09/19/2022  Lower Venous DVT Study Patient Name:  Kristin Ward  Date of Exam:   09/18/2022 Medical Rec #: [id number]  Accession #:    [id number] Date of Birth: 1955/11/05           Patient Gender: F Patient Age:   67 years Exam Location:  [facility] Procedure:      VAS [place] LOWER EXTREMITY VENOUS (DVT) Referring Phys: DAVID ORTIZ --------------------------------------------------------------------------------  Indications: Edema.  Risk Factors: Cancer. Limitations: Body habitus, poor ultrasound/tissue interface, bandages and patient positioning, patient pain tolerance, patient movement. Comparison Study: No prior studies. Performing Technologist: Oliver Hum RVT  Examination Guidelines: A complete evaluation includes B-mode imaging, spectral Doppler, color Doppler, and power Doppler as needed of all  accessible portions of each vessel. Bilateral testing is considered an integral part of a complete examination. Limited examinations for reoccurring indications may be performed as noted. The reflux portion of the exam is performed with the patient in reverse Trendelenburg.  +---------+---------------+---------+-----------+----------+-------------------+ RIGHT    CompressibilityPhasicitySpontaneityPropertiesThrombus Aging      +---------+---------------+---------+-----------+----------+-------------------+ CFV                     Yes      Yes                                      +---------+---------------+---------+-----------+----------+-------------------+ FV Prox                 Yes      Yes                                      +---------+---------------+---------+-----------+----------+-------------------+ FV Mid                  Yes      Yes                                      +---------+---------------+---------+-----------+----------+-------------------+ FV Distal               Yes      Yes                                      +---------+---------------+---------+-----------+----------+-------------------+ POP      Full           Yes      Yes                                      +---------+---------------+---------+-----------+----------+-------------------+ PTV      Full                                                             +---------+---------------+---------+-----------+----------+-------------------+ PERO                                                  Not well visualized +---------+---------------+---------+-----------+----------+-------------------+   +---------+---------------+---------+-----------+----------+--------------+  LEFT     CompressibilityPhasicitySpontaneityPropertiesThrombus Aging +---------+---------------+---------+-----------+----------+--------------+ CFV      Full           Yes      Yes                                  +---------+---------------+---------+-----------+----------+--------------+ SFJ      Full                                                        +---------+---------------+---------+-----------+----------+--------------+ FV Prox  Full                                                        +---------+---------------+---------+-----------+----------+--------------+ FV Mid   Full                                                        +---------+---------------+---------+-----------+----------+--------------+ FV DistalFull                                                        +---------+---------------+---------+-----------+----------+--------------+ PFV      Full                                                        +---------+---------------+---------+-----------+----------+--------------+ POP      Full           Yes      Yes                                 +---------+---------------+---------+-----------+----------+--------------+ PTV      Full                                                        +---------+---------------+---------+-----------+----------+--------------+ PERO     Full                                                        +---------+---------------+---------+-----------+----------+--------------+     Summary: RIGHT: - There is no evidence of deep vein thrombosis in the lower extremity. However, portions of this examination were limited- see technologist comments above.  - No cystic structure found in the popliteal fossa.  LEFT: - There is no evidence  of deep vein thrombosis in the lower extremity.  - No cystic structure found in the popliteal fossa.  *See table(s) above for measurements and observations. Electronically signed by Jamelle Haring on 09/19/2022 at 2:52:33 PM.    Final    CT ABDOMEN PELVIS W CONTRAST  Result Date: 09/18/2022 CLINICAL DATA:  Metastatic disease evaluation.  Breast cancer. EXAM: CT ABDOMEN AND PELVIS WITH  CONTRAST TECHNIQUE: Multidetector CT imaging of the abdomen and pelvis was performed using the standard protocol following bolus administration of intravenous contrast. RADIATION DOSE REDUCTION: This exam was performed according to the departmental dose-optimization program which includes automated exposure control, adjustment of the mA and/or kV according to patient size and/or use of iterative reconstruction technique. CONTRAST:  75m OMNIPAQUE IOHEXOL 300 MG/ML  SOLN COMPARISON:  CT of the chest abdomen pelvis dated 08/13/2022. FINDINGS: Lower chest: Minimal right lung base linear atelectasis/scarring. No intra-abdominal free air.  Small free fluid within the pelvis. Hepatobiliary: The liver is unremarkable. No biliary dilatation there is stone within the gallbladder. No pericholecystic fluid or evidence of acute cholecystitis by CT. Pancreas: Unremarkable. No pancreatic ductal dilatation or surrounding inflammatory changes. Spleen: Normal in size without focal abnormality. Adrenals/Urinary Tract: The adrenal glands are unremarkable. Interval development of mild right hydronephrosis and mild right hydroureter. No obstructing stone identified. Findings likely related to compression of the distal right ureter within the pelvis. There is no hydronephrosis or nephrolithiasis on the left. The urinary bladder is mildly distended. There is focal area of thickened appearance of the right posterior bladder dome which is suboptimally evaluated. A bladder lesion is not excluded further evaluation with cystoscopy is recommended. There is diffuse bladder wall haziness and edema. Correlation with urinalysis recommended to exclude cystitis. Stomach/Bowel: There is no bowel obstruction or active inflammation. The appendix is normal. Vascular/Lymphatic: Mild aortoiliac atherosclerotic disease. The IVC is unremarkable. There is retroperitoneal adenopathy similar to prior CT. No portal venous gas. Reproductive: Enlarged myomatous  uterus. There is fluid distended endometrium concerning for obstruction or stricture of the endocervical canal. Other: There is extensive edema of the right lower extremity likely related to compression of the right iliac veins within the pelvis. This has significantly progressed since the prior CT. Musculoskeletal: Similar appearance of treated right iliac treated metastatic disease. No acute osseous pathology. A 3 x 4 cm fluid density with fat fluid level in the left breast similar to prior CT, likely a postsurgical or posttreatment changes. IMPRESSION: 1. Interval development of mild right hydronephrosis likely related to compression of the distal right ureter within the pelvis. 2. Extensive edema in the right lower extremity likely related to compression of the right iliac veins in the pelvis. 3. Focal area of thickened appearance of the right posterior bladder. Further evaluation with cystoscopy is recommended. 4. Enlarged myomatous uterus with fluid distended endometrium concerning for obstruction or stricture of the endocervical canal. 5. Retroperitoneal adenopathy similar to prior CT. 6. Cholelithiasis. 7. Similar appearance of treated right iliac metastatic disease. 8.  Aortic Atherosclerosis (ICD10-I70.0). Electronically Signed   By: AAnner CreteM.D.   On: 09/18/2022 23:24    Assessment: 67y.o. 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 WEarleen Newportthis morning, I appreciated his input. IR plan to do diagnostic angiogram with IVUS  possible angioplasty/stent on coming Monday. Pt agrees with the plan, she wants to stay in hospital for pain control and procedure on Monday.  -will add oxycontin for her pain control, she  feels oxycodone is also helping, but does not last long. -I will reach out to urology tomorrow, to see if they recommend cystoscopy, and if they will offer inpatient versus outpatient. -I will f/u    [name], MD 09/19/2022

## 2022-09-19 NOTE — Progress Notes (Addendum)
PROGRESS NOTE  Kristin Ward  [id number] DOB: 1956-03-04 DOA: 09/18/2022 PCP: [name], MD   Brief Narrative: Patient is a 67 female with history of diabetes type 2, chronic combined systolic/diastolic CHF, hypertension, hyperlipidemia breast cancer with metastasis to bone, cancer related pain, peripheral neuropathy secondary to chemotherapy sent from cancer center for admission for further management of progressive worsening of right lower extremity edema and pain.  On presentation, lab work showed white cell count of 2.8, hemoglobin 10.6, creatinine 1.1.  Patient was admitted for the management of intractable pain secondary to unilateral edema of right lower extremity  Assessment & Plan:  Principal Problem:   Intractable pain Active Problems:   Chronic combined systolic and diastolic heart failure (HCC)   Essential hypertension   COPD (chronic obstructive pulmonary disease) (HCC)   Malignant neoplasm of lower-outer quadrant of left breast of female, estrogen receptor negative (HCC)   Anemia   Mixed hyperlipidemia   Malignant neoplasm metastatic to bone [facility])   Peripheral neuropathy due to chemotherapy (HCC)   Type 2 diabetes mellitus (HCC)   Unilateral edema of lower extremity   Leukopenia   Right lower extremity pain  with edema: Continue supportive care and pain management.  Venous Doppler negative for DVT.Elevate the leg.  Palliative care assisting with pain management. CT abdomen/pelvis showed  extensive edema in the right lower extremity likely related to compression of the right iliac veins in the pelvis. Edema is most likely secondary to mass effect of metastatic cancer around the pelvis,right groin.  Complains of spasm, added Robaxin. Oncology consulted IR if there is any option for reducing right iliac vein compression   Metastatic left breast cancer: Follows with Dr. Burr Medico.  Estrogen receptor positive.  Diagnosed in October 2018.  Status post left breast  lumpectomy, adjuvant chemo and radiation.  Found to have bone mets.  Was recently on carboplatin, gemcitabine and Keytruda.  Oncology planning for palliative radiation  Chronic combined systolic/diastolic CHF: No signs of decompensation.  On furosemide as needed.  Also on Entresto and carvedilol.  Echo on 07/01/2022 showed EF of 40 to [id number], grade 1 diastolic dysfunction.  Hypertension: On carvedilol, Entresto.  Monitor blood pressure  Pancytopenia: Most likely secondary to malignancy/chemo.  Monitor the counts  History of COPD: Currently on room air.  Continue bronchodilator as needed  Hyperlipidemia: On Lipitor  Peripheral neuropathy: Secondary to chemo.  On gabapentin  Diabetes type 2: Continue monitoring blood sugar.  Currently on sliding scale.  Hemoglobin A1c of 5.9.  AKI versus CKD stage II: Creatinine was 1.22 on 09/11/2022.  Currently stable  Abnormal finding of urinary bladder on imaging: CT abdomen/pelvis showed focal area of thickened appearance of the right posterior bladder.   Also showed development of mild right hydronephrosis likely related to compression of the distal right ureter within the pelvis.  Renal function is stable. We recommend follow-up with urology as an outpatient for possible need of cystoscopy/stent placement if needed.       DVT prophylaxis:enoxaparin (LOVENOX) injection 40 mg Start: 09/18/22 2200     Code Status: Full Code  Family Communication: None at the bedside  Patient status: Inpatient  Patient is from : Home  Anticipated discharge to: Home  Estimated DC date: Not sure   Consultants: Oncology, IR  Procedures: None  Antimicrobials:  Anti-infectives (From admission, onward)    None       Subjective: Patient seen and examined at bedside this morning.  Hemodynamically stable.  Lying on the bed.  Has severe edema of right lower extremity.  She said the pain is better but the spasm of the leg is bothering her.  No problem with  passing urine  Objective: Vitals:   09/18/22 2058 09/19/22 0029 09/19/22 0506  BP: (!) 104/57 106/60 113/78  Pulse: 76 76 90  Resp: 18 18 18  $ Temp: 97.7 F (36.5 C) 98.4 F (36.9 C) 98.4 F (36.9 C)  TempSrc: Oral Oral Oral  SpO2: 97% 97% 99%   No intake or output data in the 24 hours ending 09/19/22 0756 There were no vitals filed for this visit.  Examination:  General exam: In mild distress,obese HEENT: PERRL Respiratory system:  no wheezes or crackles  Cardiovascular system: S1 & S2 heard, RRR.  [place] Gastrointestinal system: Abdomen is nondistended, soft and nontender. Central nervous system: Alert and oriented Extremities: Severe edema of right lower extremity extending up to the groin , no clubbing ,no cyanosis Skin: No rashes, no ulcers,no icterus     Data Reviewed: I have personally reviewed following labs and imaging studies  CBC: Recent Labs  Lab 09/18/22 1045 09/19/22 0528  WBC 2.8* 3.3*  NEUTROABS 1.9  --   HGB 10.6* 9.6*  HCT 31.8* 29.4*  MCV 90.9 93.3  PLT 162 0000000*   Basic Metabolic Panel: Recent Labs  Lab 09/18/22 1045 09/19/22 0528  NA 140 134*  K 4.6 4.1  CL 109 108  CO2 25 21*  GLUCOSE 96 85  BUN 17 16  CREATININE 1.15* 1.29*  CALCIUM 8.9 8.2*  MG 2.0  --      No results found for this or any previous visit (from the past 240 hour(s)).   Radiology Studies: CT ABDOMEN PELVIS W CONTRAST  Result Date: 09/18/2022 CLINICAL DATA:  Metastatic disease evaluation.  Breast cancer. EXAM: CT ABDOMEN AND PELVIS WITH CONTRAST TECHNIQUE: Multidetector CT imaging of the abdomen and pelvis was performed using the standard protocol following bolus administration of intravenous contrast. RADIATION DOSE REDUCTION: This exam was performed according to the departmental dose-optimization program which includes automated exposure control, adjustment of the mA and/or kV according to patient size and/or use of iterative reconstruction technique. CONTRAST:   61m OMNIPAQUE IOHEXOL 300 MG/ML  SOLN COMPARISON:  CT of the chest abdomen pelvis dated 08/13/2022. FINDINGS: Lower chest: Minimal right lung base linear atelectasis/scarring. No intra-abdominal free air.  Small free fluid within the pelvis. Hepatobiliary: The liver is unremarkable. No biliary dilatation there is stone within the gallbladder. No pericholecystic fluid or evidence of acute cholecystitis by CT. Pancreas: Unremarkable. No pancreatic ductal dilatation or surrounding inflammatory changes. Spleen: Normal in size without focal abnormality. Adrenals/Urinary Tract: The adrenal glands are unremarkable. Interval development of mild right hydronephrosis and mild right hydroureter. No obstructing stone identified. Findings likely related to compression of the distal right ureter within the pelvis. There is no hydronephrosis or nephrolithiasis on the left. The urinary bladder is mildly distended. There is focal area of thickened appearance of the right posterior bladder dome which is suboptimally evaluated. A bladder lesion is not excluded further evaluation with cystoscopy is recommended. There is diffuse bladder wall haziness and edema. Correlation with urinalysis recommended to exclude cystitis. Stomach/Bowel: There is no bowel obstruction or active inflammation. The appendix is normal. Vascular/Lymphatic: Mild aortoiliac atherosclerotic disease. The IVC is unremarkable. There is retroperitoneal adenopathy similar to prior CT. No portal venous gas. Reproductive: Enlarged myomatous uterus. There is fluid distended endometrium concerning for obstruction or stricture of the endocervical canal. Other: There is  extensive edema of the right lower extremity likely related to compression of the right iliac veins within the pelvis. This has significantly progressed since the prior CT. Musculoskeletal: Similar appearance of treated right iliac treated metastatic disease. No acute osseous pathology. A 3 x 4 cm fluid  density with fat fluid level in the left breast similar to prior CT, likely a postsurgical or posttreatment changes. IMPRESSION: 1. Interval development of mild right hydronephrosis likely related to compression of the distal right ureter within the pelvis. 2. Extensive edema in the right lower extremity likely related to compression of the right iliac veins in the pelvis. 3. Focal area of thickened appearance of the right posterior bladder. Further evaluation with cystoscopy is recommended. 4. Enlarged myomatous uterus with fluid distended endometrium concerning for obstruction or stricture of the endocervical canal. 5. Retroperitoneal adenopathy similar to prior CT. 6. Cholelithiasis. 7. Similar appearance of treated right iliac metastatic disease. 8.  Aortic Atherosclerosis (ICD10-I70.0). Electronically Signed   By: Anner Crete M.D.   On: 09/18/2022 23:24   VAS [place] LOWER EXTREMITY VENOUS (DVT)  Result Date: 09/18/2022  Lower Venous DVT Study Patient Name:  Aarohi ANN Lechuga  Date of Exam:   09/18/2022 Medical Rec #: [id number]            Accession #:    [id number] Date of Birth: [birth year]           Patient Gender: F Patient Age:   67 years Exam Location:  [facility] Procedure:      VAS [place] LOWER EXTREMITY VENOUS (DVT) Referring Phys: DAVID ORTIZ --------------------------------------------------------------------------------  Indications: Edema.  Risk Factors: Cancer. Limitations: Body habitus, poor ultrasound/tissue interface, bandages and patient positioning, patient pain tolerance, patient movement. Comparison Study: No prior studies. Performing Technologist: Oliver Hum RVT  Examination Guidelines: A complete evaluation includes B-mode imaging, spectral Doppler, color Doppler, and power Doppler as needed of all accessible portions of each vessel. Bilateral testing is considered an integral part of a complete examination. Limited examinations for reoccurring indications may be performed as  noted. The reflux portion of the exam is performed with the patient in reverse Trendelenburg.  +---------+---------------+---------+-----------+----------+-------------------+ RIGHT    CompressibilityPhasicitySpontaneityPropertiesThrombus Aging      +---------+---------------+---------+-----------+----------+-------------------+ CFV                     Yes      Yes                                      +---------+---------------+---------+-----------+----------+-------------------+ FV Prox                 Yes      Yes                                      +---------+---------------+---------+-----------+----------+-------------------+ FV Mid                  Yes      Yes                                      +---------+---------------+---------+-----------+----------+-------------------+ FV Distal               Yes      Yes                                      +---------+---------------+---------+-----------+----------+-------------------+  POP      Full           Yes      Yes                                      +---------+---------------+---------+-----------+----------+-------------------+ PTV      Full                                                             +---------+---------------+---------+-----------+----------+-------------------+ PERO                                                  Not well visualized +---------+---------------+---------+-----------+----------+-------------------+   +---------+---------------+---------+-----------+----------+--------------+ LEFT     CompressibilityPhasicitySpontaneityPropertiesThrombus Aging +---------+---------------+---------+-----------+----------+--------------+ CFV      Full           Yes      Yes                                 +---------+---------------+---------+-----------+----------+--------------+ SFJ      Full                                                         +---------+---------------+---------+-----------+----------+--------------+ FV Prox  Full                                                        +---------+---------------+---------+-----------+----------+--------------+ FV Mid   Full                                                        +---------+---------------+---------+-----------+----------+--------------+ FV DistalFull                                                        +---------+---------------+---------+-----------+----------+--------------+ PFV      Full                                                        +---------+---------------+---------+-----------+----------+--------------+ POP      Full           Yes      Yes                                 +---------+---------------+---------+-----------+----------+--------------+  PTV      Full                                                        +---------+---------------+---------+-----------+----------+--------------+ PERO     Full                                                        +---------+---------------+---------+-----------+----------+--------------+    Summary: RIGHT: - There is no evidence of deep vein thrombosis in the lower extremity. However, portions of this examination were limited- see technologist comments above.  - No cystic structure found in the popliteal fossa.  LEFT: - There is no evidence of deep vein thrombosis in the lower extremity.  - No cystic structure found in the popliteal fossa.  *See table(s) above for measurements and observations.    Preliminary     Scheduled Meds:  aspirin  81 mg Oral Daily   atorvastatin  40 mg Oral Daily   brimonidine  1 drop Both Eyes BID   carvedilol  18.75 mg Oral BID WC   Chlorhexidine Gluconate Cloth  6 each Topical Daily   cyanocobalamin  1,000 mcg Oral Daily   empagliflozin  10 mg Oral QAC breakfast   enoxaparin (LOVENOX) injection  40 mg Subcutaneous Q24H   gabapentin  300 mg  Oral BID   gabapentin  400 mg Oral QHS   insulin aspart  0-15 Units Subcutaneous TID WC   pantoprazole  40 mg Oral Daily   sacubitril-valsartan  1 tablet Oral BID   spironolactone  25 mg Oral Daily   Continuous Infusions:   LOS: 1 day   Shelly Coss, MD [facility] P2/03/2023, 7:56 AM

## 2022-09-19 NOTE — TOC Progression Note (Signed)
Transition of Care [facility]) - Progression Note    Patient Details  Name: Kristin Ward MRN: [id number] Date of Birth: 1956/08/05  Transition of Care [facility]) CM/SW [place], RN Phone Number:[phone]  09/19/2022, 7:46 PM  Clinical Narrative:     Transition of Care [facility]) Screening Note   Patient Details  Name: [name] Date of Birth: February 02, 1956   Transition of Care [facility]) CM/SW Contact:    Angelita Ingles, RN Phone Number: 09/19/2022, 7:46 PM    Transition of Care [facility]) has reviewed patient and no TOC needs have been identified at this time. We will continue to monitor patient advancement through interdisciplinary progression rounds. If new patient transition needs arise, please place a TOC consult.          Expected Discharge Plan and Services                                               Social Determinants of Health (SDOH) Interventions SDOH Screenings   Food Insecurity: No Food Insecurity (09/18/2022)  Housing: Low Risk  (09/18/2022)  Transportation Needs: No Transportation Needs (09/18/2022)  Utilities: Not At Risk (09/18/2022)  Depression (PHQ2-9): Low Risk  (08/09/2022)  Financial Resource Strain: Low Risk  (08/09/2022)  Physical Activity: Sufficiently Active (08/09/2022)  Social Connections: Unknown (09/01/2020)  Stress: No Stress Concern Present (08/09/2022)  Tobacco Use: Medium Risk (09/18/2022)    Readmission Risk Interventions     No data to display

## 2022-09-19 NOTE — Progress Notes (Signed)
Patient refused 1200 fingerstick for BG monitoring and 1200 Insulin

## 2022-09-20 DIAGNOSIS — R52 Pain, unspecified: Secondary | ICD-10-CM | POA: Diagnosis not present

## 2022-09-20 LAB — BASIC METABOLIC PANEL
Anion gap: 6 (ref 5–15)
BUN: 14 mg/dL (ref 8–23)
CO2: 22 mmol/L (ref 22–32)
Calcium: 8.3 mg/dL — ABNORMAL LOW (ref 8.9–10.3)
Chloride: 107 mmol/L (ref 98–111)
Creatinine, Ser: 1.17 mg/dL — ABNORMAL HIGH (ref 0.44–1.00)
GFR, Estimated: 52 mL/min — ABNORMAL LOW (ref >60)
Glucose, Bld: 82 mg/dL (ref 70–99)
Potassium: 4.2 mmol/L (ref 3.5–5.1)
Sodium: 135 mmol/L (ref 135–145)

## 2022-09-20 LAB — CBC
HCT: 29 % — ABNORMAL LOW (ref 36.0–46.0)
Hemoglobin: 9.4 g/dL — ABNORMAL LOW (ref 12.0–15.0)
MCH: 30.2 pg (ref 26.0–34.0)
MCHC: 32.4 g/dL (ref 30.0–36.0)
MCV: 93.2 fL (ref 80.0–100.0)
Platelets: 169 10*3/uL (ref 150–400)
RBC: 3.11 MIL/uL — ABNORMAL LOW (ref 3.87–5.11)
RDW: 14.3 % (ref 11.5–15.5)
WBC: 3.6 10*3/uL — ABNORMAL LOW (ref 4.0–10.5)
nRBC: 0 % (ref 0.0–0.2)

## 2022-09-20 MED ORDER — IPRATROPIUM-ALBUTEROL 0.5-2.5 (3) MG/3ML IN SOLN: 3.0000 mL | Freq: Four times a day (QID) | RESPIRATORY_TRACT | Status: DC | PRN

## 2022-09-20 NOTE — Progress Notes (Signed)
Kristin Ward   DOB:1955-12-30   [id number]   [id number]  Hem/onc follow up   Subjective: Patient states that her right lower extremity pain is better controlled, does feel very tight.  No other new complaints.  Objective:  Vitals:   09/20/22 0625 09/20/22 1307  BP: (!) 141/115 100/62  Pulse: 82 79  Resp: (!) 22 18  Temp: 98.1 F (36.7 C) 97.8 F (36.6 C)  SpO2: 99% 98%    There is no height or weight on file to calculate BMI.  Intake/Output Summary (Last 24 hours) at 09/20/2022 1822 Last data filed at 09/20/2022 1307 Gross per 24 hour  Intake 480 ml  Output --  Net 480 ml     Sclerae unicteric  Oropharynx clear  No peripheral adenopathy  Lungs clear -- no rales or rhonchi  Heart regular rate and rhythm  Abdomen benign  MSK no focal spinal tenderness, (+) significant right lower extremity edema with soft tissue firmness up to right groin  Neuro nonfocal    CBG (last 3)  Recent Labs    09/18/22 2101 09/19/22 0731  GLUCAP 95 90     Labs:  Urine Studies No results for input(s): "UHGB", "CRYS" in the last 72 hours.  Invalid input(s): "UACOL", "UAPR", "USPG", "UPH", "UTP", "UGL", "UKET", "UBIL", "UNIT", "UROB", "ULEU", "UEPI", "UWBC", "URBC", "UBAC", "CAST", "UCOM", "BILUA"  Basic Metabolic Panel: Recent Labs  Lab 09/18/22 1045 09/19/22 0528 09/20/22 0639  NA 140 134* 135  K 4.6 4.1 4.2  CL 109 108 107  CO2 25 21* 22  GLUCOSE 96 85 82  BUN 17 16 14  $ CREATININE 1.15* 1.29* 1.17*  CALCIUM 8.9 8.2* 8.3*  MG 2.0  --   --    GFR Estimated Creatinine Clearance: 60 mL/min (A) (by C-G formula based on SCr of 1.17 mg/dL (H)). Liver Function Tests: Recent Labs  Lab 09/18/22 1045 09/19/22 0528  AST 26 27  ALT 14 17  ALKPHOS 89 76  BILITOT 0.5 0.6  PROT 6.5 6.6  ALBUMIN 3.3* 2.9*   No results for input(s): "LIPASE", "AMYLASE" in the last 168 hours. No results for input(s): "AMMONIA" in the last 168 hours. Coagulation profile No results for  input(s): "INR", "PROTIME" in the last 168 hours.  CBC: Recent Labs  Lab 09/18/22 1045 09/19/22 0528 09/20/22 0639  WBC 2.8* 3.3* 3.6*  NEUTROABS 1.9  --   --   HGB 10.6* 9.6* 9.4*  HCT 31.8* 29.4* 29.0*  MCV 90.9 93.3 93.2  PLT 162 138* 169   Cardiac Enzymes: No results for input(s): "CKTOTAL", "CKMB", "CKMBINDEX", "TROPONINI" in the last 168 hours. BNP: Invalid input(s): "POCBNP" CBG: Recent Labs  Lab 09/18/22 2101 09/19/22 0731  GLUCAP 95 90   D-Dimer No results for input(s): "DDIMER" in the last 72 hours. Hgb A1c Recent Labs    09/19/22 0528  HGBA1C 5.9*   Lipid Profile No results for input(s): "CHOL", "HDL", "LDLCALC", "TRIG", "CHOLHDL", "LDLDIRECT" in the last 72 hours. Thyroid function studies No results for input(s): "TSH", "T4TOTAL", "T3FREE", "THYROIDAB" in the last 72 hours.  Invalid input(s): "FREET3" Anemia work up No results for input(s): "VITAMINB12", "FOLATE", "FERRITIN", "TIBC", "IRON", "RETICCTPCT" in the last 72 hours. Microbiology No results found for this or any previous visit (from the past 240 hour(s)).    Studies:  CT ABDOMEN PELVIS W CONTRAST  Result Date: 09/18/2022 CLINICAL DATA:  Metastatic disease evaluation.  Breast cancer. EXAM: CT ABDOMEN AND PELVIS WITH CONTRAST TECHNIQUE: Multidetector CT imaging of  the abdomen and pelvis was performed using the standard protocol following bolus administration of intravenous contrast. RADIATION DOSE REDUCTION: This exam was performed according to the departmental dose-optimization program which includes automated exposure control, adjustment of the mA and/or kV according to patient size and/or use of iterative reconstruction technique. CONTRAST:  41m OMNIPAQUE IOHEXOL 300 MG/ML  SOLN COMPARISON:  CT of the chest abdomen pelvis dated 08/13/2022. FINDINGS: Lower chest: Minimal right lung base linear atelectasis/scarring. No intra-abdominal free air.  Small free fluid within the pelvis. Hepatobiliary:  The liver is unremarkable. No biliary dilatation there is stone within the gallbladder. No pericholecystic fluid or evidence of acute cholecystitis by CT. Pancreas: Unremarkable. No pancreatic ductal dilatation or surrounding inflammatory changes. Spleen: Normal in size without focal abnormality. Adrenals/Urinary Tract: The adrenal glands are unremarkable. Interval development of mild right hydronephrosis and mild right hydroureter. No obstructing stone identified. Findings likely related to compression of the distal right ureter within the pelvis. There is no hydronephrosis or nephrolithiasis on the left. The urinary bladder is mildly distended. There is focal area of thickened appearance of the right posterior bladder dome which is suboptimally evaluated. A bladder lesion is not excluded further evaluation with cystoscopy is recommended. There is diffuse bladder wall haziness and edema. Correlation with urinalysis recommended to exclude cystitis. Stomach/Bowel: There is no bowel obstruction or active inflammation. The appendix is normal. Vascular/Lymphatic: Mild aortoiliac atherosclerotic disease. The IVC is unremarkable. There is retroperitoneal adenopathy similar to prior CT. No portal venous gas. Reproductive: Enlarged myomatous uterus. There is fluid distended endometrium concerning for obstruction or stricture of the endocervical canal. Other: There is extensive edema of the right lower extremity likely related to compression of the right iliac veins within the pelvis. This has significantly progressed since the prior CT. Musculoskeletal: Similar appearance of treated right iliac treated metastatic disease. No acute osseous pathology. A 3 x 4 cm fluid density with fat fluid level in the left breast similar to prior CT, likely a postsurgical or posttreatment changes. IMPRESSION: 1. Interval development of mild right hydronephrosis likely related to compression of the distal right ureter within the pelvis. 2.  Extensive edema in the right lower extremity likely related to compression of the right iliac veins in the pelvis. 3. Focal area of thickened appearance of the right posterior bladder. Further evaluation with cystoscopy is recommended. 4. Enlarged myomatous uterus with fluid distended endometrium concerning for obstruction or stricture of the endocervical canal. 5. Retroperitoneal adenopathy similar to prior CT. 6. Cholelithiasis. 7. Similar appearance of treated right iliac metastatic disease. 8.  Aortic Atherosclerosis (ICD10-I70.0). Electronically Signed   By: AAnner CreteM.D.   On: 09/18/2022 23:24    Assessment: 67y.o. 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  [facility]  I spoke with Dr. MLaurence Ferrarithis morning. -I discussed the case with on-call urologist Dr. NMilford Cagethis morning.  He reviewed the image and the labs, since patient is not symptomatic from hydronephrosis, he does not recommend ureter stent placement or nephrostomy tube placement.  Will monitor her kidney function closely. -Continue pain management and other supportive care. -I will follow-up as needed.   YTruitt Merle  MD 09/20/2022

## 2022-09-20 NOTE — Progress Notes (Signed)
Patient ID: Kristin Ward, female   DOB: 04-30-1956, 67 y.o.   MRN: [id number] Pt tent scheduled for RLE venography with IVUS directed angioplasty/stenting on 2/12 at [facility]. Pt to be transferred  to [facility] over weekend in preparation. Pt seen by Dr. Earleen Newport yesterday- see consult note for additional details. Pt has agreed to undergo procedure and has no further questions at this time. Consent signed and in chart. TRH/oncology services aware.

## 2022-09-20 NOTE — Progress Notes (Signed)
PROGRESS NOTE  Kristin Ward  [id number] DOB: Feb 09, 1956 DOA: 09/18/2022 PCP: [name], MD   Brief Narrative: Patient is a 67 female with history of diabetes type 2, chronic combined systolic/diastolic CHF, hypertension, hyperlipidemia breast cancer with metastasis to bone, cancer related pain, peripheral neuropathy secondary to chemotherapy sent from cancer center for admission for further management of progressive worsening of right lower extremity edema and pain.  On presentation, lab work showed white cell count of 2.8, hemoglobin 10.6, creatinine 1.1.  Patient was admitted for the management of intractable pain secondary to unilateral edema of right lower extremity  Assessment & Plan:  Principal Problem:   Intractable pain Active Problems:   Chronic combined systolic and diastolic heart failure (HCC)   Essential hypertension   COPD (chronic obstructive pulmonary disease) (HCC)   Malignant neoplasm of lower-outer quadrant of left breast of female, estrogen receptor negative (HCC)   Anemia   Mixed hyperlipidemia   Malignant neoplasm metastatic to bone [facility])   Peripheral neuropathy due to chemotherapy (HCC)   Type 2 diabetes mellitus (HCC)   Unilateral edema of lower extremity   Leukopenia   Right lower extremity pain  with edema: Continue supportive care and pain management.  Venous Doppler negative for DVT.Elevate the leg.  Palliative care assisting with pain management. CT abdomen/pelvis showed  extensive edema in the right lower extremity likely related to compression of the right iliac veins in the pelvis. Edema is most likely secondary to mass effect of metastatic cancer around the pelvis,right groin.  Complains of spasm, added Robaxin. Oncology consulted IR if there is any option for reducing right iliac vein compression.  Plan for possible angioplasty/stent placement of inferior vena cava on Monday  Metastatic left breast cancer: Follows with Dr. Burr Medico.  Estrogen  receptor positive.  Diagnosed in October 2018.  Status post left breast lumpectomy, adjuvant chemo and radiation.  Found to have bone mets.  Was recently on carboplatin, gemcitabine and Keytruda.  Oncology planning for palliative radiation  Chronic combined systolic/diastolic CHF: No signs of decompensation.  On furosemide as needed.  Also on Entresto and carvedilol.  Echo on 07/01/2022 showed EF of 40 to [id number], grade 1 diastolic dysfunction.  Hypertension: On carvedilol, Entresto.  Monitor blood pressure  Pancytopenia: Most likely secondary to malignancy/chemo.  Monitor the counts  History of COPD: Currently on room air.  Continue bronchodilator as needed  Hyperlipidemia: On Lipitor  Peripheral neuropathy: Secondary to chemo.  On gabapentin  Diabetes type 2: Continue monitoring blood sugar.  Currently on sliding scale.  Hemoglobin A1c of 5.9.  AKI versus CKD stage II: Creatinine was 1.22 on 09/11/2022.  Currently stable  Abnormal finding of urinary bladder on imaging: CT abdomen/pelvis showed focal area of thickened appearance of the right posterior bladder.   Also showed development of mild right hydronephrosis likely related to compression of the distal right ureter within the pelvis.  Renal function is stable. We recommend follow-up with urology as an outpatient for possible need of cystoscopy/stent placement if needed.       DVT prophylaxis:enoxaparin (LOVENOX) injection 40 mg Start: 09/18/22 2200     Code Status: Full Code  Family Communication: None at the bedside  Patient status: Inpatient  Patient is from : Home  Anticipated discharge to: Home  Estimated DC date: early next week   Consultants: Oncology, IR  Procedures: None  Antimicrobials:  Anti-infectives (From admission, onward)    None       Subjective: Patient seen and examined  at bedside today.  Hemodynamically stable.  She looks more comfortable than yesterday.  Still has pain and spasm of right lower  extremity but she is feeling better  Objective: Vitals:   09/19/22 0506 09/19/22 1327 09/19/22 2023 09/20/22 0625  BP: 113/78 102/63 (!) 92/58 (!) 141/115  Pulse: 90 84 80 82  Resp: 18 17 18 $ (!) 22  Temp: 98.4 F (36.9 C) 98.2 F (36.8 C) 98.2 F (36.8 C) 98.1 F (36.7 C)  TempSrc: Oral Oral Oral Oral  SpO2: 99% 94% 97% 99%    Intake/Output Summary (Last 24 hours) at 09/20/2022 1124 Last data filed at 09/20/2022 [id number] Gross per 24 hour  Intake 480 ml  Output --  Net 480 ml   There were no vitals filed for this visit.  Examination:  General exam: Overall comfortable, not in distress,obese HEENT: PERRL Respiratory system:  no wheezes or crackles  Cardiovascular system: S1 & S2 heard, RRR.  Gastrointestinal system: Abdomen is nondistended, soft and nontender. Central nervous system: Alert and oriented Extremities: severe right lower extremity edema up to thigh, no clubbing ,no cyanosis Skin: No rashes, no ulcers,no icterus       Data Reviewed: I have personally reviewed following labs and imaging studies  CBC: Recent Labs  Lab 09/18/22 1045 09/19/22 0528 09/20/22 0639  WBC 2.8* 3.3* 3.6*  NEUTROABS 1.9  --   --   HGB 10.6* 9.6* 9.4*  HCT 31.8* 29.4* 29.0*  MCV 90.9 93.3 93.2  PLT 162 138* [id number]   Basic Metabolic Panel: Recent Labs  Lab 09/18/22 1045 09/19/22 0528 09/20/22 0639  NA 140 134* 135  K 4.6 4.1 4.2  CL 109 108 107  CO2 25 21* 22  GLUCOSE 96 85 82  BUN 17 16 14  $ CREATININE 1.15* 1.29* 1.17*  CALCIUM 8.9 8.2* 8.3*  MG 2.0  --   --      No results found for this or any previous visit (from the past 240 hour(s)).   Radiology Studies: VAS [place] LOWER EXTREMITY VENOUS (DVT)  Result Date: 09/19/2022  Lower Venous DVT Study Patient Name:  Kristin Ward  Date of Exam:   09/18/2022 Medical Rec #: [id number]            Accession #:    [id number] Date of Birth: 1956-03-14           Patient Gender: F Patient Age:   67 years Exam Location:  [facility] Procedure:      VAS [place] LOWER EXTREMITY VENOUS (DVT) Referring Phys: DAVID ORTIZ --------------------------------------------------------------------------------  Indications: Edema.  Risk Factors: Cancer. Limitations: Body habitus, poor ultrasound/tissue interface, bandages and patient positioning, patient pain tolerance, patient movement. Comparison Study: No prior studies. Performing Technologist: Oliver Hum RVT  Examination Guidelines: A complete evaluation includes B-mode imaging, spectral Doppler, color Doppler, and power Doppler as needed of all accessible portions of each vessel. Bilateral testing is considered an integral part of a complete examination. Limited examinations for reoccurring indications may be performed as noted. The reflux portion of the exam is performed with the patient in reverse Trendelenburg.  +---------+---------------+---------+-----------+----------+-------------------+ RIGHT    CompressibilityPhasicitySpontaneityPropertiesThrombus Aging      +---------+---------------+---------+-----------+----------+-------------------+ CFV                     Yes      Yes                                      +---------+---------------+---------+-----------+----------+-------------------+  FV Prox                 Yes      Yes                                      +---------+---------------+---------+-----------+----------+-------------------+ FV Mid                  Yes      Yes                                      +---------+---------------+---------+-----------+----------+-------------------+ FV Distal               Yes      Yes                                      +---------+---------------+---------+-----------+----------+-------------------+ POP      Full           Yes      Yes                                      +---------+---------------+---------+-----------+----------+-------------------+ PTV      Full                                                              +---------+---------------+---------+-----------+----------+-------------------+ PERO                                                  Not well visualized +---------+---------------+---------+-----------+----------+-------------------+   +---------+---------------+---------+-----------+----------+--------------+ LEFT     CompressibilityPhasicitySpontaneityPropertiesThrombus Aging +---------+---------------+---------+-----------+----------+--------------+ CFV      Full           Yes      Yes                                 +---------+---------------+---------+-----------+----------+--------------+ SFJ      Full                                                        +---------+---------------+---------+-----------+----------+--------------+ FV Prox  Full                                                        +---------+---------------+---------+-----------+----------+--------------+ FV Mid   Full                                                        +---------+---------------+---------+-----------+----------+--------------+  FV DistalFull                                                        +---------+---------------+---------+-----------+----------+--------------+ PFV      Full                                                        +---------+---------------+---------+-----------+----------+--------------+ POP      Full           Yes      Yes                                 +---------+---------------+---------+-----------+----------+--------------+ PTV      Full                                                        +---------+---------------+---------+-----------+----------+--------------+ PERO     Full                                                        +---------+---------------+---------+-----------+----------+--------------+     Summary: RIGHT: - There is no evidence of deep vein thrombosis in the  lower extremity. However, portions of this examination were limited- see technologist comments above.  - No cystic structure found in the popliteal fossa.  LEFT: - There is no evidence of deep vein thrombosis in the lower extremity.  - No cystic structure found in the popliteal fossa.  *See table(s) above for measurements and observations. Electronically signed by Jamelle Haring on 09/19/2022 at 2:52:33 PM.    Final    CT ABDOMEN PELVIS W CONTRAST  Result Date: 09/18/2022 CLINICAL DATA:  Metastatic disease evaluation.  Breast cancer. EXAM: CT ABDOMEN AND PELVIS WITH CONTRAST TECHNIQUE: Multidetector CT imaging of the abdomen and pelvis was performed using the standard protocol following bolus administration of intravenous contrast. RADIATION DOSE REDUCTION: This exam was performed according to the departmental dose-optimization program which includes automated exposure control, adjustment of the mA and/or kV according to patient size and/or use of iterative reconstruction technique. CONTRAST:  7m OMNIPAQUE IOHEXOL 300 MG/ML  SOLN COMPARISON:  CT of the chest abdomen pelvis dated 08/13/2022. FINDINGS: Lower chest: Minimal right lung base linear atelectasis/scarring. No intra-abdominal free air.  Small free fluid within the pelvis. Hepatobiliary: The liver is unremarkable. No biliary dilatation there is stone within the gallbladder. No pericholecystic fluid or evidence of acute cholecystitis by CT. Pancreas: Unremarkable. No pancreatic ductal dilatation or surrounding inflammatory changes. Spleen: Normal in size without focal abnormality. Adrenals/Urinary Tract: The adrenal glands are unremarkable. Interval development of mild right hydronephrosis and mild right hydroureter. No obstructing stone identified. Findings likely related to compression of the distal right ureter within the pelvis. There is no hydronephrosis or nephrolithiasis on the left. The urinary bladder  is mildly distended. There is focal area of  thickened appearance of the right posterior bladder dome which is suboptimally evaluated. A bladder lesion is not excluded further evaluation with cystoscopy is recommended. There is diffuse bladder wall haziness and edema. Correlation with urinalysis recommended to exclude cystitis. Stomach/Bowel: There is no bowel obstruction or active inflammation. The appendix is normal. Vascular/Lymphatic: Mild aortoiliac atherosclerotic disease. The IVC is unremarkable. There is retroperitoneal adenopathy similar to prior CT. No portal venous gas. Reproductive: Enlarged myomatous uterus. There is fluid distended endometrium concerning for obstruction or stricture of the endocervical canal. Other: There is extensive edema of the right lower extremity likely related to compression of the right iliac veins within the pelvis. This has significantly progressed since the prior CT. Musculoskeletal: Similar appearance of treated right iliac treated metastatic disease. No acute osseous pathology. A 3 x 4 cm fluid density with fat fluid level in the left breast similar to prior CT, likely a postsurgical or posttreatment changes. IMPRESSION: 1. Interval development of mild right hydronephrosis likely related to compression of the distal right ureter within the pelvis. 2. Extensive edema in the right lower extremity likely related to compression of the right iliac veins in the pelvis. 3. Focal area of thickened appearance of the right posterior bladder. Further evaluation with cystoscopy is recommended. 4. Enlarged myomatous uterus with fluid distended endometrium concerning for obstruction or stricture of the endocervical canal. 5. Retroperitoneal adenopathy similar to prior CT. 6. Cholelithiasis. 7. Similar appearance of treated right iliac metastatic disease. 8.  Aortic Atherosclerosis (ICD10-I70.0). Electronically Signed   By: Anner Crete M.D.   On: 09/18/2022 23:24    Scheduled Meds:  aspirin  81 mg Oral Daily   atorvastatin   40 mg Oral Daily   brimonidine  1 drop Both Eyes BID   carvedilol  18.75 mg Oral BID WC   Chlorhexidine Gluconate Cloth  6 each Topical Daily   cyanocobalamin  1,000 mcg Oral Daily   empagliflozin  10 mg Oral QAC breakfast   enoxaparin (LOVENOX) injection  40 mg Subcutaneous Q24H   gabapentin  300 mg Oral BID   gabapentin  400 mg Oral QHS   insulin aspart  0-15 Units Subcutaneous TID WC   oxyCODONE  10 mg Oral Q12H   pantoprazole  40 mg Oral Daily   sacubitril-valsartan  1 tablet Oral BID   spironolactone  25 mg Oral Daily   Continuous Infusions:  methocarbamol (ROBAXIN) IV       LOS: 2 days   Shelly Coss, MD [facility] P2/04/2023, 11:24 AM

## 2022-09-21 DIAGNOSIS — R52 Pain, unspecified: Secondary | ICD-10-CM | POA: Diagnosis not present

## 2022-09-21 MED ORDER — CARVEDILOL 3.125 MG PO TABS: 3.1250 mg | ORAL_TABLET | Freq: Two times a day (BID) | ORAL | Status: DC

## 2022-09-21 MED ORDER — SENNOSIDES-DOCUSATE SODIUM 8.6-50 MG PO TABS
1.0000 | ORAL_TABLET | Freq: Two times a day (BID) | ORAL | Status: DC
  Administered 2022-09-21 – 2022-09-26 (×10): 1 via ORAL
  Filled 2022-09-21 (×11): qty 1

## 2022-09-21 MED ORDER — BISACODYL 10 MG RE SUPP
10.0000 mg | Freq: Once | RECTAL | Status: AC
  Administered 2022-09-21: 10 mg via RECTAL
  Filled 2022-09-21: qty 1

## 2022-09-21 MED ORDER — POLYETHYLENE GLYCOL 3350 17 G PO PACK
17.0000 g | PACK | Freq: Every day | ORAL | Status: DC
  Administered 2022-09-21: 17 g via ORAL
  Filled 2022-09-21 (×4): qty 1

## 2022-09-21 NOTE — Progress Notes (Signed)
PROGRESS NOTE  Kristin Ward  [id number] DOB: February 26, 1956 DOA: 09/18/2022 PCP: [name], MD   Brief Narrative: Patient is a 81 female with history of diabetes type 2, chronic combined systolic/diastolic CHF, hypertension, hyperlipidemia breast cancer with metastasis to bone, cancer related pain, peripheral neuropathy secondary to chemotherapy sent from cancer center for admission for further management of progressive worsening of right lower extremity edema and pain.    Patient was admitted for the management of intractable pain secondary to unilateral edema of right lower extremity.  IR consulted and planning for right lower extremity venography with IVUS directed angioplasty/stenting on 2/12 at [facility].  Assessment & Plan:  Principal Problem:   Intractable pain Active Problems:   Chronic combined systolic and diastolic heart failure (HCC)   Essential hypertension   COPD (chronic obstructive pulmonary disease) (HCC)   Malignant neoplasm of lower-outer quadrant of left breast of female, estrogen receptor negative (HCC)   Anemia   Mixed hyperlipidemia   Malignant neoplasm metastatic to bone [facility])   Peripheral neuropathy due to chemotherapy (HCC)   Type 2 diabetes mellitus (HCC)   Unilateral edema of lower extremity   Leukopenia   Right lower extremity pain  with edema: Continue supportive care and pain management.  Venous Doppler negative for DVT.Elevate the leg of possible.  CT abdomen/pelvis showed  extensive edema in the right lower extremity likely related to compression of the right iliac veins in the pelvis. Edema is most likely secondary to mass effect of metastatic cancer around the pelvis,right groin.  Complained of spasm, added Robaxin. Oncology consulted IR if there is any option for reducing right iliac vein compression.   IR consulted and planning for right lower extremity venography with IVUS directed angioplasty/stenting on 2/12 at [facility].  Metastatic left  breast cancer: Follows with Dr. Burr Medico.  Estrogen receptor positive.  Diagnosed in October 2018.  Status post left breast lumpectomy, adjuvant chemo and radiation.  Found to have bone mets.  Was recently on carboplatin, gemcitabine and Keytruda.  Oncology planning for palliative radiation  Chronic combined systolic/diastolic CHF: No signs of decompensation.  On furosemide as needed.  Also on Entresto and carvedilol at home.  Echo on 07/01/2022 showed EF of 40 to [id number], grade 1 diastolic dysfunction.  Hypertension: On carvedilol, Entresto. BP soft so we are holding carvedilol and Entresto for now.  Pancytopenia: Most likely secondary to malignancy/chemo.  Monitor the counts  History of COPD: Currently on room air.  Continue bronchodilator as needed  Hyperlipidemia: On Lipitor  Peripheral neuropathy: Secondary to chemo.  On gabapentin  Diabetes type 2: Continue monitoring blood sugar.  Currently on sliding scale.  Hemoglobin A1c of 5.9.  AKI versus CKD stage II: Creatinine was 1.22 on 09/11/2022.  Currently stable  Abnormal finding of urinary bladder on imaging: CT abdomen/pelvis showed focal area of thickened appearance of the right posterior bladder.   Also showed development of mild right hydronephrosis likely related to compression of the distal right ureter within the pelvis.  Renal function is stable. We recommend follow-up with urology as an outpatient for possible need of cystoscopy/stent placement if needed.  Constipation: Continue aggressive bowel regimen       DVT prophylaxis:enoxaparin (LOVENOX) injection 40 mg Start: 09/18/22 2200     Code Status: Full Code  Family Communication: None at the bedside, patient says no need to call anybody  Patient status: Inpatient  Patient is from : Home  Anticipated discharge to: Home  Estimated DC date:  next week, waiting for IR intervention   Consultants: Oncology, IR  Procedures: None  Antimicrobials:  Anti-infectives (From  admission, onward)    None       Subjective: Patient seen and examined at bedside today,hemodynamically stable.  Overall looks comfortable but continues to complain of significant pain on the edematous right lower extremity.  No bowel movement for several days.  Objective: Vitals:   09/20/22 2113 09/21/22 0645 09/21/22 0758 09/21/22 0857  BP: 109/63 108/65 90/62 94/64 $  Pulse: 81 88 85 86  Resp: 16 18 18 18  $ Temp: 98.1 F (36.7 C) 98.1 F (36.7 C) 97.8 F (36.6 C)   TempSrc: Oral Oral Oral   SpO2: 100% 96% 98%     Intake/Output Summary (Last 24 hours) at 09/21/2022 1108 Last data filed at 09/20/2022 1307 Gross per 24 hour  Intake 240 ml  Output --  Net 240 ml   There were no vitals filed for this visit.  Examination:  General exam: Overall comfortable, not in distress, obese HEENT: PERRL Respiratory system:  no wheezes or crackles  Cardiovascular system: S1 & S2 heard, RRR.  Gastrointestinal system: Abdomen is nondistended, soft and nontender. Central nervous system: Alert and oriented Extremities: Severe edema of right lower extremity up to thigh, no clubbing ,no cyanosis Skin: No rashes, no ulcers,no icterus     Data Reviewed: I have personally reviewed following labs and imaging studies  CBC: Recent Labs  Lab 09/18/22 1045 09/19/22 0528 09/20/22 0639  WBC 2.8* 3.3* 3.6*  NEUTROABS 1.9  --   --   HGB 10.6* 9.6* 9.4*  HCT 31.8* 29.4* 29.0*  MCV 90.9 93.3 93.2  PLT 162 138* [id number]   Basic Metabolic Panel: Recent Labs  Lab 09/18/22 1045 09/19/22 0528 09/20/22 0639  NA 140 134* 135  K 4.6 4.1 4.2  CL 109 108 107  CO2 25 21* 22  GLUCOSE 96 85 82  BUN 17 16 14  $ CREATININE 1.15* 1.29* 1.17*  CALCIUM 8.9 8.2* 8.3*  MG 2.0  --   --      No results found for this or any previous visit (from the past 240 hour(s)).   Radiology Studies: No results found.  Scheduled Meds:  aspirin  81 mg Oral Daily   atorvastatin  40 mg Oral Daily   brimonidine  1  drop Both Eyes BID   carvedilol  3.125 mg Oral BID WC   Chlorhexidine Gluconate Cloth  6 each Topical Daily   cyanocobalamin  1,000 mcg Oral Daily   empagliflozin  10 mg Oral QAC breakfast   enoxaparin (LOVENOX) injection  40 mg Subcutaneous Q24H   gabapentin  300 mg Oral BID   gabapentin  400 mg Oral QHS   insulin aspart  0-15 Units Subcutaneous TID WC   oxyCODONE  10 mg Oral Q12H   pantoprazole  40 mg Oral Daily   sacubitril-valsartan  1 tablet Oral BID   spironolactone  25 mg Oral Daily   Continuous Infusions:  methocarbamol (ROBAXIN) IV 1,000 mg (09/21/22 1030)     LOS: 3 days   Shelly Coss, MD [facility] P2/05/2023, 11:08 AM

## 2022-09-21 NOTE — Progress Notes (Signed)
Assumed care at 1430 from OSH. Oriented to unit. Aox4, tele monitored, no complaints of CP/SOB. PRN pain medication given per orders. Admission documentation complete. RN skin check complete. Full assessment per flowsheets, Meds given per [facility]. Safety maintained, call bell in reach, all needs made know.

## 2022-09-21 NOTE — Progress Notes (Signed)
Patient has refused lovenox and CBG for the shift.

## 2022-09-22 DIAGNOSIS — R52 Pain, unspecified: Secondary | ICD-10-CM | POA: Diagnosis not present

## 2022-09-22 MED ORDER — METHOCARBAMOL 1000 MG/10ML IJ SOLN
1000.0000 mg | Freq: Four times a day (QID) | INTRAVENOUS | Status: DC | PRN
  Administered 2022-09-22 – 2022-09-24 (×6): 1000 mg via INTRAVENOUS
  Filled 2022-09-22: qty 1000
  Filled 2022-09-22: qty 10
  Filled 2022-09-22 (×2): qty 1000
  Filled 2022-09-22 (×2): qty 10

## 2022-09-22 NOTE — Progress Notes (Signed)
PROGRESS NOTE  Kristin Ward  [id number] DOB: 01-Aug-1956 DOA: 09/18/2022 PCP: [name], MD   Brief Narrative: Patient is a 43 female with history of diabetes type 2, chronic combined systolic/diastolic CHF, hypertension, hyperlipidemia breast cancer with metastasis to bone, cancer related pain, peripheral neuropathy secondary to chemotherapy sent from cancer center for admission for further management of progressive worsening of right lower extremity edema and pain.    Patient was admitted for the management of intractable pain secondary to unilateral edema of right lower extremity.  IR consulted and planning for right lower extremity venography with IVUS directed angioplasty/stenting on 2/12 at [facility].  Assessment & Plan:  Principal Problem:   Intractable pain Active Problems:   Chronic combined systolic and diastolic heart failure (HCC)   Essential hypertension   COPD (chronic obstructive pulmonary disease) (HCC)   Malignant neoplasm of lower-outer quadrant of left breast of female, estrogen receptor negative (HCC)   Anemia   Mixed hyperlipidemia   Malignant neoplasm metastatic to bone [facility])   Peripheral neuropathy due to chemotherapy (HCC)   Type 2 diabetes mellitus (HCC)   Unilateral edema of lower extremity   Leukopenia   Right lower extremity pain  with edema: Continue supportive care and pain management.  Venous Doppler negative for DVT.Elevate the leg of possible.  CT abdomen/pelvis showed  extensive edema in the right lower extremity likely related to compression of the right iliac veins in the pelvis. Edema is most likely secondary to mass effect of metastatic cancer around the pelvis,right groin.   Oncology consulted IR--- planning for right lower extremity venography with IVUS directed angioplasty/stenting on 2/12 at [facility]  Metastatic left breast cancer: Follows with Dr. Burr Medico.  Estrogen receptor positive.  Diagnosed in October 2018.  Status post left  breast lumpectomy, adjuvant chemo and radiation.  Found to have bone mets.  Was recently on carboplatin, gemcitabine and Keytruda.  Oncology planning for palliative radiation  Chronic combined systolic/diastolic CHF: No signs of decompensation.  On furosemide as needed.  Also on Entresto and carvedilol at home.  Echo on 07/01/2022 showed EF of 40 to [id number], grade 1 diastolic dysfunction.  Hypertension: On carvedilol, Entresto. BP soft so we are holding carvedilol and Entresto for now.  Pancytopenia: Most likely secondary to malignancy/chemo.  No labs done since 2/9-- will order for the AM  History of COPD: Currently on room air.  Continue bronchodilator as needed  Hyperlipidemia: On Lipitor  Peripheral neuropathy: Secondary to chemo.  On gabapentin  Diabetes type 2: Continue monitoring blood sugar.  Currently on sliding scale.  Hemoglobin A1c of 5.9.  AKI versus CKD stage II: Creatinine was 1.22 on 09/11/2022.  Currently stable  Abnormal finding of urinary bladder on imaging: CT abdomen/pelvis showed focal area of thickened appearance of the right posterior bladder.   Also showed development of mild right hydronephrosis likely related to compression of the distal right ureter within the pelvis.  Renal function is stable. recommend follow-up with urology as an outpatient for possible need of cystoscopy/stent placement if needed.  Constipation: Continue aggressive bowel regimen   Obesity Estimated body mass index is 35.84 kg/m as calculated from the following:   Height as of this encounter: 5' 7"$  (1.702 m).   Weight as of this encounter: 103.8 kg.     DVT prophylaxis:Place and maintain sequential compression device Start: 09/21/22 2224 enoxaparin (LOVENOX) injection 40 mg Start: 09/18/22 2200     Code Status: Full Code  Family Communication: None at the  bedside, patient says no need to call anybody  Patient status: Inpatient  Patient is from : Home  Anticipated discharge to:  Home  Estimated DC date:  waiting for IR intervention   Consultants: Oncology, IR     Subjective: No nausea. no pain  Objective: Vitals:   09/21/22 2148 09/22/22 0031 09/22/22 0405 09/22/22 0713  BP: 113/77 (!) 157/144 106/67 98/64  Pulse: (!) 101  91 90  Resp: 20 19 20 19  $ Temp: 98.5 F (36.9 C)  98.1 F (36.7 C) 97.7 F (36.5 C)  TempSrc: Oral  Oral Oral  SpO2: 98% 96% 97% 98%  Weight:   103.8 kg   Height:        Intake/Output Summary (Last 24 hours) at 09/22/2022 [id number] Last data filed at 09/21/2022 2150 Gross per 24 hour  Intake 480 ml  Output 400 ml  Net 80 ml   Filed Weights   09/21/22 1432 09/22/22 0405  Weight: 101.9 kg 103.8 kg    Examination:   General: Appearance:    Obese female in no acute distress     Lungs:     respirations unlabored  Heart:    Normal heart rate. Normal rhythm. No murmurs, rubs, or gallops.   MS:   All extremities are intact.   Neurologic:   Awake, alert      Data Reviewed: I have personally reviewed following labs and imaging studies  CBC: Recent Labs  Lab 09/18/22 1045 09/19/22 0528 09/20/22 0639  WBC 2.8* 3.3* 3.6*  NEUTROABS 1.9  --   --   HGB 10.6* 9.6* 9.4*  HCT 31.8* 29.4* 29.0*  MCV 90.9 93.3 93.2  PLT 162 138* [id number]   Basic Metabolic Panel: Recent Labs  Lab 09/18/22 1045 09/19/22 0528 09/20/22 0639  NA 140 134* 135  K 4.6 4.1 4.2  CL 109 108 107  CO2 25 21* 22  GLUCOSE 96 85 82  BUN 17 16 14  $ CREATININE 1.15* 1.29* 1.17*  CALCIUM 8.9 8.2* 8.3*  MG 2.0  --   --      No results found for this or any previous visit (from the past 240 hour(s)).   Radiology Studies: No results found.  Scheduled Meds:  aspirin  81 mg Oral Daily   atorvastatin  40 mg Oral Daily   brimonidine  1 drop Both Eyes BID   Chlorhexidine Gluconate Cloth  6 each Topical Daily   cyanocobalamin  1,000 mcg Oral Daily   empagliflozin  10 mg Oral QAC breakfast   enoxaparin (LOVENOX) injection  40 mg Subcutaneous Q24H    gabapentin  300 mg Oral BID   gabapentin  400 mg Oral QHS   insulin aspart  0-15 Units Subcutaneous TID WC   oxyCODONE  10 mg Oral Q12H   pantoprazole  40 mg Oral Daily   polyethylene glycol  17 g Oral Daily   senna-docusate  1 tablet Oral BID   spironolactone  25 mg Oral Daily   Continuous Infusions:  methocarbamol (ROBAXIN) IV 1,000 mg (09/22/22 0658)     LOS: 4 days   Geradine Girt, DO [facility] 09/22/2022, 8:07 AM

## 2022-09-22 NOTE — Progress Notes (Signed)
Assumed pt care at 0700. Aox4, tele monitored but d/c'd d/t no orders, no complaints of CP/SOB. C/o R leg spasms, prn dilaudid, oxy and robaxin given per orders. Robaxin helps the most per pt. Full assessment per flowsheets, Meds given per [facility] Safety maintained, pt refusing bed alarm and assist to BR, call bell in reach, all needs made know.

## 2022-09-23 ENCOUNTER — Ambulatory Visit: Payer: 59 | Admitting: Physical Therapy

## 2022-09-23 ENCOUNTER — Other Ambulatory Visit: Payer: Self-pay

## 2022-09-23 ENCOUNTER — Inpatient Hospital Stay (HOSPITAL_COMMUNITY): Payer: 59

## 2022-09-23 DIAGNOSIS — R52 Pain, unspecified: Secondary | ICD-10-CM | POA: Diagnosis not present

## 2022-09-23 HISTORY — PX: IR VENO/EXT/BI: IMG677

## 2022-09-23 LAB — BASIC METABOLIC PANEL
Anion gap: 8 (ref 5–15)
BUN: 15 mg/dL (ref 8–23)
CO2: 20 mmol/L — ABNORMAL LOW (ref 22–32)
Calcium: 8.4 mg/dL — ABNORMAL LOW (ref 8.9–10.3)
Chloride: 105 mmol/L (ref 98–111)
Creatinine, Ser: 1.29 mg/dL — ABNORMAL HIGH (ref 0.44–1.00)
GFR, Estimated: 46 mL/min — ABNORMAL LOW (ref >60)
Glucose, Bld: 96 mg/dL (ref 70–99)
Potassium: 4.3 mmol/L (ref 3.5–5.1)
Sodium: 133 mmol/L — ABNORMAL LOW (ref 135–145)

## 2022-09-23 LAB — CBC WITH DIFFERENTIAL/PLATELET
Abs Immature Granulocytes: 0.03 10*3/uL (ref 0.00–0.07)
Basophils Absolute: 0 10*3/uL (ref 0.0–0.1)
Basophils Relative: 0 %
Eosinophils Absolute: 0.2 10*3/uL (ref 0.0–0.5)
Eosinophils Relative: 3 %
HCT: 31.4 % — ABNORMAL LOW (ref 36.0–46.0)
Hemoglobin: 10.6 g/dL — ABNORMAL LOW (ref 12.0–15.0)
Immature Granulocytes: 1 %
Lymphocytes Relative: 13 %
Lymphs Abs: 0.6 10*3/uL — ABNORMAL LOW (ref 0.7–4.0)
MCH: 30.7 pg (ref 26.0–34.0)
MCHC: 33.8 g/dL (ref 30.0–36.0)
MCV: 91 fL (ref 80.0–100.0)
Monocytes Absolute: 0.8 10*3/uL (ref 0.1–1.0)
Monocytes Relative: 16 %
Neutro Abs: 3.4 10*3/uL (ref 1.7–7.7)
Neutrophils Relative %: 67 %
Platelets: 342 10*3/uL (ref 150–400)
RBC: 3.45 MIL/uL — ABNORMAL LOW (ref 3.87–5.11)
RDW: 14.6 % (ref 11.5–15.5)
WBC: 5 10*3/uL (ref 4.0–10.5)
nRBC: 0 % (ref 0.0–0.2)

## 2022-09-23 LAB — PROTIME-INR
INR: 1.2 (ref 0.8–1.2)
Prothrombin Time: 14.6 seconds (ref 11.4–15.2)

## 2022-09-23 MED ORDER — LIDOCAINE HCL 1 % IJ SOLN
INTRAMUSCULAR | Status: AC
  Filled 2022-09-23: qty 20

## 2022-09-23 MED ORDER — FENTANYL CITRATE (PF) 100 MCG/2ML IJ SOLN
INTRAMUSCULAR | Status: AC
  Filled 2022-09-23: qty 2

## 2022-09-23 MED ORDER — MIDAZOLAM HCL 2 MG/2ML IJ SOLN
INTRAMUSCULAR | Status: DC | PRN
  Administered 2022-09-23 (×2): .5 mg via INTRAVENOUS

## 2022-09-23 MED ORDER — MIDAZOLAM HCL 2 MG/2ML IJ SOLN
INTRAMUSCULAR | Status: AC
  Filled 2022-09-23: qty 2

## 2022-09-23 MED ORDER — FENTANYL CITRATE (PF) 100 MCG/2ML IJ SOLN
INTRAMUSCULAR | Status: DC | PRN
  Administered 2022-09-23: 50 ug via INTRAVENOUS

## 2022-09-23 MED ORDER — HYDROMORPHONE HCL 1 MG/ML IJ SOLN
INTRAMUSCULAR | Status: AC
  Filled 2022-09-23: qty 1

## 2022-09-23 MED ORDER — HYDROMORPHONE HCL 1 MG/ML IJ SOLN
INTRAMUSCULAR | Status: DC | PRN
  Administered 2022-09-23: 1 mg via INTRAVENOUS

## 2022-09-23 NOTE — Care Management Important Message (Signed)
Important Message  Patient Details  Name: Kristin Ward MRN: [id number] Date of Birth: 21-Jul-1956   Medicare Important Message Given:  Yes     Shelda Altes 09/23/2022, 10:14 AM

## 2022-09-23 NOTE — Procedures (Signed)
Interventional Radiology Procedure Note  Procedure:   Attempt to perform venogram with possible intervention.  Patient was unable to be comfortable and thus safe on the table.  Sedation was initiated without comfortable position.   We withdrew from the procedure.   Recommendations:  - Restart diet - We will discuss another treatment day with anesthesia.     Signed,  Dulcy Fanny. Earleen Newport, DO

## 2022-09-23 NOTE — Progress Notes (Signed)
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    Patient was admitted for the management of intractable pain secondary to unilateral edema of right lower extremity.  IR consulted and planning for right lower extremity venography with IVUS directed angioplasty/stenting on 2/12 at [facility].   Assessment & Plan:  Principal Problem:   Intractable pain Active Problems:   Chronic combined systolic and diastolic heart failure (HCC)   Essential hypertension   COPD (chronic obstructive pulmonary disease) (HCC)   Malignant neoplasm of lower-outer quadrant of left breast of female, estrogen receptor negative (HCC)   Anemia   Mixed hyperlipidemia   Malignant neoplasm metastatic to bone [facility])   Peripheral neuropathy due to chemotherapy (HCC)   Type 2 diabetes mellitus (HCC)   Unilateral edema of lower extremity   Leukopenia   Right lower extremity pain  with edema: Continue supportive care and pain management.  Venous Doppler negative for DVT.Elevate the leg of possible.  CT abdomen/pelvis showed  extensive edema in the right lower extremity likely related to compression of the right iliac veins in the pelvis. Edema is most likely secondary to mass effect of metastatic cancer around the pelvis,right groin.   Oncology consulted IR--- planning for right lower extremity venography with IVUS directed angioplasty/stenting on 2/12 at [facility].  Metastatic left breast cancer: Follows with Dr. Burr Medico.  Estrogen receptor positive.  Diagnosed in October 2018.  Status post left  breast lumpectomy, adjuvant chemo and radiation.  Found to have bone mets.  Was recently on carboplatin, gemcitabine and Keytruda.  Oncology planning for palliative radiation  Chronic combined systolic/diastolic CHF: No signs of decompensation.  On furosemide as needed.  Also on Entresto and carvedilol at home.  Echo on 07/01/2022 showed EF of 40 to [id number], grade 1 diastolic dysfunction.  Hypertension: On carvedilol, Entresto. BP soft so we are holding carvedilol and Entresto for now.  Pancytopenia: Most likely secondary to malignancy/chemo.   History of COPD: Currently on room air.  Continue bronchodilator as needed  Hyperlipidemia: On Lipitor  Peripheral neuropathy: Secondary to chemo.  On gabapentin  Diabetes type 2: Continue monitoring blood sugar.  Currently on sliding scale.  Hemoglobin A1c of 5.9.  AKI versus CKD stage II: Creatinine was 1.22 on 09/11/2022.  Currently stable  Abnormal finding of urinary bladder on imaging: CT abdomen/pelvis showed focal area of thickened appearance of the right posterior bladder.   Also showed development of mild right hydronephrosis likely related to compression of the distal right ureter within the pelvis.  Renal function is stable. recommend follow-up with urology as an outpatient for possible need of cystoscopy/stent placement if needed.  Constipation: Continue aggressive bowel regimen   Obesity Estimated body mass index is 35.39 kg/m as calculated from the following:   Height as of this encounter: 5' 7"$  (1.702 m).   Weight as of this encounter: 102.5 kg.     DVT prophylaxis:Place and maintain sequential compression device Start: 09/21/22 2224 enoxaparin (LOVENOX) injection 40 mg Start: 09/18/22 2200     Code Status: Full Code  Family Communication: None at the bedside, patient says no need to call anybody  Patient status: Inpatient  Patient is from : Home  Anticipated discharge to: Home  Estimated DC date:  waiting for IR  intervention   Consultants: Oncology, IR     Subjective: Asking about procedure timing  Objective: Vitals:   09/23/22 1107 09/23/22 1120 09/23/22 1125 09/23/22 1130  BP: (!) 125/94 124/88 106/86 107/71  Pulse: (!) 102 (!) 101 (!) 107 (!) 103  Resp: (!) 31 (!) 28 19 (!) 21  Temp:      TempSrc:      SpO2: 96% 99% 98% 100%  Weight:      Height:        Intake/Output Summary (Last 24 hours) at 09/23/2022 1137 Last data filed at 09/23/2022 [id number] Gross per 24 hour  Intake 120 ml  Output 600 ml  Net -480 ml   Filed Weights   09/21/22 1432 09/22/22 0405 09/23/22 0437  Weight: 101.9 kg 103.8 kg 102.5 kg    Examination:  General: Appearance:    Obese female in no acute distress     Lungs:     respirations unlabored  Heart:    Tachycardic. Normal rhythm. No murmurs, rubs, or gallops.   MS:   All extremities are intact.   Neurologic:   Awake, alert        Data Reviewed: I have personally reviewed following labs and imaging studies  CBC: Recent Labs  Lab 09/18/22 1045 09/19/22 0528 09/20/22 0639 09/23/22 0237  WBC 2.8* 3.3* 3.6* 5.0  NEUTROABS 1.9  --   --  3.4  HGB 10.6* 9.6* 9.4* 10.6*  HCT 31.8* 29.4* 29.0* 31.4*  MCV 90.9 93.3 93.2 91.0  PLT 162 138* 169 [id number]   Basic Metabolic Panel: Recent Labs  Lab 09/18/22 1045 09/19/22 0528 09/20/22 0639 09/23/22 0237  NA 140 134* 135 133*  K 4.6 4.1 4.2 4.3  CL 109 108 107 105  CO2 25 21* 22 20*  GLUCOSE 96 85 82 96  BUN 17 16 14 15  $ CREATININE 1.15* 1.29* 1.17* 1.29*  CALCIUM 8.9 8.2* 8.3* 8.4*  MG 2.0  --   --   --      No results found for this or any previous visit (from the past 240 hour(s)).   Radiology Studies: No results found.  Scheduled Meds:  aspirin  81 mg Oral Daily   atorvastatin  40 mg Oral Daily   brimonidine  1 drop Both Eyes BID   Chlorhexidine Gluconate Cloth  6 each Topical Daily   cyanocobalamin  1,000 mcg Oral Daily   enoxaparin (LOVENOX) injection  40 mg Subcutaneous Q24H    fentaNYL       gabapentin  300 mg Oral BID   gabapentin  400 mg Oral QHS   HYDROmorphone       insulin aspart  0-15 Units Subcutaneous TID WC   lidocaine       midazolam       oxyCODONE  10 mg Oral Q12H   pantoprazole  40 mg Oral Daily   polyethylene glycol  17 g Oral Daily   senna-docusate  1 tablet Oral BID   spironolactone  25 mg Oral Daily   Continuous Infusions:  methocarbamol (ROBAXIN) IV 1,000 mg (09/23/22 0347)     LOS: 5 days   Geradine Girt, DO [facility] 09/23/2022, 11:37 AM

## 2022-09-23 NOTE — Sedation Documentation (Signed)
Procedure abort at this time. Patient continues to sit up on table due to pain. Medications given with no response. Patient repositioned with no changes. Unable to proceed at this time and patient back to hospital bed. Dr. Earleen Newport at the bedside and will re-attempt with anesthesia another day. Patient agreeable at this time.

## 2022-09-23 NOTE — Sedation Documentation (Signed)
Report called to floor RN. Sedation only given. Procedure aborted.

## 2022-09-24 ENCOUNTER — Inpatient Hospital Stay (HOSPITAL_COMMUNITY): Payer: 59 | Admitting: Anesthesiology

## 2022-09-24 ENCOUNTER — Encounter (HOSPITAL_COMMUNITY): Payer: Self-pay | Admitting: Internal Medicine

## 2022-09-24 ENCOUNTER — Inpatient Hospital Stay (HOSPITAL_COMMUNITY): Payer: 59

## 2022-09-24 ENCOUNTER — Encounter (HOSPITAL_COMMUNITY)
Admission: AD | Disposition: A | Discharge status: Home / Self Care | Payer: Self-pay | Source: Ambulatory Visit | Attending: Internal Medicine

## 2022-09-24 DIAGNOSIS — R52 Pain, unspecified: Secondary | ICD-10-CM | POA: Diagnosis not present

## 2022-09-24 DIAGNOSIS — I745 Embolism and thrombosis of iliac artery: Secondary | ICD-10-CM

## 2022-09-24 DIAGNOSIS — I10 Essential (primary) hypertension: Secondary | ICD-10-CM

## 2022-09-24 DIAGNOSIS — Z87891 Personal history of nicotine dependence: Secondary | ICD-10-CM

## 2022-09-24 DIAGNOSIS — J449 Chronic obstructive pulmonary disease, unspecified: Secondary | ICD-10-CM

## 2022-09-24 HISTORY — PX: IR IVUS EACH ADDITIONAL NON CORONARY VESSEL: IMG6086

## 2022-09-24 HISTORY — PX: RADIOLOGY WITH ANESTHESIA: SHX6223

## 2022-09-24 HISTORY — PX: IR TRANSCATH PLC STENT  INITIAL VEIN  INC ANGIOPLASTY: IMG5445

## 2022-09-24 HISTORY — PX: IR VENO/EXT/UNI LEFT: IMG675

## 2022-09-24 HISTORY — PX: IR VENO/EXT/UNI RIGHT: IMG676

## 2022-09-24 HISTORY — PX: IR US GUIDE VASC ACCESS RIGHT: IMG2390

## 2022-09-24 LAB — CBC
HCT: 32.5 % — ABNORMAL LOW (ref 36.0–46.0)
Hemoglobin: 10.8 g/dL — ABNORMAL LOW (ref 12.0–15.0)
MCH: 30.2 pg (ref 26.0–34.0)
MCHC: 33.2 g/dL (ref 30.0–36.0)
MCV: 90.8 fL (ref 80.0–100.0)
Platelets: 459 10*3/uL — ABNORMAL HIGH (ref 150–400)
RBC: 3.58 MIL/uL — ABNORMAL LOW (ref 3.87–5.11)
RDW: 14.7 % (ref 11.5–15.5)
WBC: 5.3 10*3/uL (ref 4.0–10.5)
nRBC: 0 % (ref 0.0–0.2)

## 2022-09-24 LAB — BASIC METABOLIC PANEL
Anion gap: 6 (ref 5–15)
BUN: 15 mg/dL (ref 8–23)
CO2: 21 mmol/L — ABNORMAL LOW (ref 22–32)
Calcium: 8.4 mg/dL — ABNORMAL LOW (ref 8.9–10.3)
Chloride: 105 mmol/L (ref 98–111)
Creatinine, Ser: 1.36 mg/dL — ABNORMAL HIGH (ref 0.44–1.00)
GFR, Estimated: 43 mL/min — ABNORMAL LOW (ref >60)
Glucose, Bld: 99 mg/dL (ref 70–99)
Potassium: 4.5 mmol/L (ref 3.5–5.1)
Sodium: 132 mmol/L — ABNORMAL LOW (ref 135–145)

## 2022-09-24 LAB — GLUCOSE, CAPILLARY: Glucose-Capillary: 109 mg/dL — ABNORMAL HIGH (ref 70–99)

## 2022-09-24 LAB — SURGICAL PCR SCREEN
MRSA, PCR: NEGATIVE
Staphylococcus aureus: POSITIVE — AB

## 2022-09-24 SURGERY — RADIOLOGY WITH ANESTHESIA
Anesthesia: Monitor Anesthesia Care | Laterality: Right

## 2022-09-24 MED ORDER — BISACODYL 5 MG PO TBEC
5.0000 mg | DELAYED_RELEASE_TABLET | Freq: Every day | ORAL | Status: DC | PRN
  Administered 2022-09-24: 5 mg via ORAL
  Filled 2022-09-24: qty 1

## 2022-09-24 MED ORDER — ONDANSETRON HCL 4 MG/2ML IJ SOLN: 4.0000 mg | Freq: Four times a day (QID) | INTRAMUSCULAR | Status: DC | PRN

## 2022-09-24 MED ORDER — LIDOCAINE HCL 1 % IJ SOLN
INTRAMUSCULAR | Status: AC
  Filled 2022-09-24: qty 20

## 2022-09-24 MED ORDER — OXYCODONE HCL 5 MG/5ML PO SOLN: 5.0000 mg | Freq: Once | ORAL | Status: DC | PRN

## 2022-09-24 MED ORDER — FENTANYL CITRATE (PF) 100 MCG/2ML IJ SOLN
25.0000 ug | INTRAMUSCULAR | Status: DC | PRN
  Administered 2022-09-24 (×2): 25 ug via INTRAVENOUS

## 2022-09-24 MED ORDER — HYDROMORPHONE HCL 1 MG/ML IJ SOLN
1.0000 mg | INTRAMUSCULAR | Status: DC | PRN
  Administered 2022-09-24 – 2022-09-25 (×7): 1 mg via INTRAVENOUS
  Filled 2022-09-24 (×7): qty 1

## 2022-09-24 MED ORDER — LIDOCAINE 2% (20 MG/ML) 5 ML SYRINGE
INTRAMUSCULAR | Status: DC | PRN
  Administered 2022-09-24: 60 mg via INTRAVENOUS

## 2022-09-24 MED ORDER — FENTANYL CITRATE (PF) 100 MCG/2ML IJ SOLN
INTRAMUSCULAR | Status: DC | PRN
  Administered 2022-09-24: 25 ug via INTRAVENOUS

## 2022-09-24 MED ORDER — OXYCODONE HCL 5 MG PO TABS: 5.0000 mg | ORAL_TABLET | Freq: Once | ORAL | Status: DC | PRN

## 2022-09-24 MED ORDER — PROPOFOL 500 MG/50ML IV EMUL
INTRAVENOUS | Status: DC | PRN
  Administered 2022-09-24: 125 ug/kg/min via INTRAVENOUS

## 2022-09-24 MED ORDER — LACTATED RINGERS IV SOLN: INTRAVENOUS | Status: DC | PRN

## 2022-09-24 MED ORDER — FENTANYL CITRATE (PF) 100 MCG/2ML IJ SOLN
INTRAMUSCULAR | Status: AC
  Filled 2022-09-24: qty 2

## 2022-09-24 MED ORDER — MIDAZOLAM HCL 2 MG/2ML IJ SOLN
INTRAMUSCULAR | Status: DC | PRN
  Administered 2022-09-24: 2 mg via INTRAVENOUS

## 2022-09-24 MED ORDER — BISACODYL 10 MG RE SUPP
10.0000 mg | Freq: Once | RECTAL | Status: AC
  Administered 2022-09-24: 10 mg via RECTAL
  Filled 2022-09-24: qty 1

## 2022-09-24 MED ORDER — PHENYLEPHRINE 80 MCG/ML (10ML) SYRINGE FOR IV PUSH (FOR BLOOD PRESSURE SUPPORT)
PREFILLED_SYRINGE | INTRAVENOUS | Status: DC | PRN
  Administered 2022-09-24 (×4): 80 ug via INTRAVENOUS

## 2022-09-24 MED ORDER — CHLORHEXIDINE GLUCONATE 0.12 % MT SOLN
OROMUCOSAL | Status: AC
  Filled 2022-09-24: qty 15

## 2022-09-24 MED ORDER — IOHEXOL 300 MG/ML  SOLN
150.0000 mL | Freq: Once | INTRAMUSCULAR | Status: AC | PRN
  Administered 2022-09-24: 100 mL via INTRAVENOUS

## 2022-09-24 NOTE — Anesthesia Procedure Notes (Signed)
Procedure Name: MAC Date/Time: 09/24/2022 4:15 PM  Performed by: Heide Scales, CRNAPre-anesthesia Checklist: Patient identified, Emergency Drugs available, Suction available and Patient being monitored Patient Re-evaluated:Patient Re-evaluated prior to induction Oxygen Delivery Method: Simple face mask Preoxygenation: Pre-oxygenation with 100% oxygen Induction Type: IV induction Dental Injury: Teeth and Oropharynx as per pre-operative assessment

## 2022-09-24 NOTE — Anesthesia Preprocedure Evaluation (Signed)
Anesthesia Evaluation  Patient identified by MRN, date of birth, ID band Patient awake    Reviewed: Allergy & Precautions, H&P , NPO status , Patient's Chart, lab work & pertinent test results  Airway Mallampati: II   Neck ROM: full    Dental   Pulmonary asthma , COPD, former smoker   breath sounds clear to auscultation       Cardiovascular hypertension,  Rhythm:regular Rate:Normal  Cath (07/2022): normal coronaries. TTE (06/2022): EF 40-45%, mild MR   Neuro/Psych  Headaches PSYCHIATRIC DISORDERS  Depression     Neuromuscular disease    GI/Hepatic   Endo/Other  diabetes, Type 2    Renal/GU      Musculoskeletal  (+) Arthritis ,    Abdominal   Peds  Hematology   Anesthesia Other Findings   Reproductive/Obstetrics H/o left breast CA                             Anesthesia Physical Anesthesia Plan  ASA: 3  Anesthesia Plan: MAC   Post-op Pain Management:    Induction: Intravenous  PONV Risk Score and Plan: 2 and Propofol infusion, Treatment may vary due to age or medical condition and Ondansetron  Airway Management Planned: Simple Face Mask  Additional Equipment:   Intra-op Plan:   Post-operative Plan:   Informed Consent: I have reviewed the patients History and Physical, chart, labs and discussed the procedure including the risks, benefits and alternatives for the proposed anesthesia with the patient or authorized representative who has indicated his/her understanding and acceptance.     Dental advisory given  Plan Discussed with: CRNA, Anesthesiologist and Surgeon  Anesthesia Plan Comments:        Anesthesia Quick Evaluation

## 2022-09-24 NOTE — Progress Notes (Signed)
PROGRESS NOTE  Kristin Ward  [id number] DOB: 08/16/1955 DOA: 09/18/2022 PCP: [name], MD   Brief Narrative: Patient is a 88 female with history of diabetes type 2, chronic combined systolic/diastolic CHF, hypertension, hyperlipidemia breast cancer with metastasis to bone, cancer related pain, peripheral neuropathy secondary to chemotherapy sent from cancer center for admission for further management of progressive worsening of right lower extremity edema and pain.    Patient was admitted for the management of intractable pain secondary to unilateral edema of right lower extremity.  IR consulted and planning for right lower extremity venography with IVUS directed angioplasty/stenting on 2/12 at [facility].   Assessment & Plan:  Principal Problem:   Intractable pain Active Problems:   Chronic combined systolic and diastolic heart failure (HCC)   Essential hypertension   COPD (chronic obstructive pulmonary disease) (HCC)   Malignant neoplasm of lower-outer quadrant of left breast of female, estrogen receptor negative (HCC)   Anemia   Mixed hyperlipidemia   Malignant neoplasm metastatic to bone [facility])   Peripheral neuropathy due to chemotherapy (HCC)   Type 2 diabetes mellitus (HCC)   Unilateral edema of lower extremity   Leukopenia   Right lower extremity pain  with edema: Continue supportive care and pain management.  Venous Doppler negative for DVT.Elevate the leg of possible.  CT abdomen/pelvis showed  extensive edema in the right lower extremity likely related to compression of the right iliac veins in the pelvis. Edema is most likely secondary to mass effect of metastatic cancer around the pelvis,right groin.   Oncology consulted IR--- planning for right lower extremity venography with IVUS directed angioplasty/stenting at [facility]-- unable to do on 2/12 so now planned with anesthesiology on 2/13  Metastatic left breast cancer: Follows with Dr. Burr Medico.  Estrogen receptor  positive.  Diagnosed in October 2018.  Status post left breast lumpectomy, adjuvant chemo and radiation.  Found to have bone mets.  Was recently on carboplatin, gemcitabine and Keytruda.  Oncology planning for palliative radiation  Chronic combined systolic/diastolic CHF: No signs of decompensation.  On furosemide as needed.  Also on Entresto and carvedilol at home.  Echo on 07/01/2022 showed EF of 40 to [id number], grade 1 diastolic dysfunction.  Hypertension: On carvedilol, Entresto. BP soft so we are holding carvedilol and Entresto for now.  Pancytopenia: Most likely secondary to malignancy/chemo.   History of COPD: Currently on room air.  Continue bronchodilator as needed  Hyperlipidemia: On Lipitor  Peripheral neuropathy: Secondary to chemo.  On gabapentin  Diabetes type 2: Continue monitoring blood sugar.  Currently on sliding scale.  Hemoglobin A1c of 5.9.  AKI versus CKD stage II: Creatinine was 1.22 on 09/11/2022.  Currently stable  Abnormal finding of urinary bladder on imaging: CT abdomen/pelvis showed focal area of thickened appearance of the right posterior bladder.   Also showed development of mild right hydronephrosis likely related to compression of the distal right ureter within the pelvis.  Renal function is stable. recommend follow-up with urology as an outpatient for possible need of cystoscopy/stent placement if needed.  Constipation: Continue aggressive bowel regimen   Obesity Estimated body mass index is 34.93 kg/m as calculated from the following:   Height as of this encounter: 5' 7"$  (1.702 m).   Weight as of this encounter: 101.2 kg.     DVT prophylaxis:Place and maintain sequential compression device Start: 09/21/22 2224 enoxaparin (LOVENOX) injection 40 mg Start: 09/18/22 2200     Code Status: Full Code   Patient status: Inpatient  Patient is from : Home  Anticipated discharge to: Home?  Estimated DC date:  waiting for IR intervention   Consultants:  Oncology, IR     Subjective: Not able to tolerate the procedure yesterday so plan is today with anesthesia  Objective: Vitals:   09/24/22 0126 09/24/22 0301 09/24/22 0852 09/24/22 1225  BP:  99/71 100/61 92/67  Pulse:  80 97 93  Resp:  18 18 19  $ Temp:  98.2 F (36.8 C) 98 F (36.7 C) 97.9 F (36.6 C)  TempSrc:  Oral Oral Oral  SpO2:  98% 97% 97%  Weight: 101.2 kg     Height:        Intake/Output Summary (Last 24 hours) at 09/24/2022 1232 Last data filed at 09/24/2022 0300 Gross per 24 hour  Intake --  Output 300 ml  Net -300 ml   Filed Weights   09/22/22 0405 09/23/22 0437 09/24/22 0126  Weight: 103.8 kg 102.5 kg 101.2 kg    Examination:   General: Appearance:    Obese female in no acute distress     Lungs:     respirations unlabored  Heart:    Normal heart rate.   MS:   All extremities are intact.   Neurologic:   Awake, alert- blind       Data Reviewed: I have personally reviewed following labs and imaging studies  CBC: Recent Labs  Lab 09/18/22 1045 09/19/22 0528 09/20/22 0639 09/23/22 0237 09/24/22 0609  WBC 2.8* 3.3* 3.6* 5.0 5.3  NEUTROABS 1.9  --   --  3.4  --   HGB 10.6* 9.6* 9.4* 10.6* 10.8*  HCT 31.8* 29.4* 29.0* 31.4* 32.5*  MCV 90.9 93.3 93.2 91.0 90.8  PLT 162 138* 169 342 [id number]   Basic Metabolic Panel: Recent Labs  Lab 09/18/22 1045 09/19/22 0528 09/20/22 0639 09/23/22 0237 09/24/22 0609  NA 140 134* 135 133* 132*  K 4.6 4.1 4.2 4.3 4.5  CL 109 108 107 105 105  CO2 25 21* 22 20* 21*  GLUCOSE 96 85 82 96 99  BUN 17 16 14 15 15  $ CREATININE 1.15* 1.29* 1.17* 1.29* 1.36*  CALCIUM 8.9 8.2* 8.3* 8.4* 8.4*  MG 2.0  --   --   --   --      No results found for this or any previous visit (from the past 240 hour(s)).   Radiology Studies: IR Veno/Ext/Bi  Result Date: 09/23/2022 INDICATION: 67 year old female presents for venogram, intravascular ultrasound, possible intervention for suspected right iliac vein occlusion and  symptomatic swelling of the right lower extremity. EXAM: LOWER EXTREMITY VENOGRAM DISCONTINUATION OF LOWER EXTREMITY VENOGRAM COMPARISON:  CT 09/18/2022 MEDICATIONS: NONE ANESTHESIA/SEDATION: Moderate (conscious) sedation was employed during this procedure. A total of Versed 1.0 mg and DILAUDID 1 MG, 50 mcg fentanyl was administered intravenously by the radiology nurse. Total intra-service moderate Sedation Time: 15 minutes. The patient's level of consciousness and vital signs were monitored continuously by radiology nursing throughout the procedure under my direct supervision. FLUOROSCOPY: None COMPLICATIONS: None TECHNIQUE: Informed written consent was obtained from the patient after a thorough discussion of the procedural risks, benefits and alternatives. All questions were addressed. Maximal Sterile Barrier Technique was utilized including caps, mask, sterile gowns, sterile gloves, sterile drape, hand hygiene and skin antiseptic. A timeout was performed prior to the initiation of the procedure. Initiation of conscious sedation was performed in order to attempt to have the patient become comfortable under the image intensifier. After medications it was clear that  the patient would not be comfortable nor safe on the table. Procedure was aborted. FINDINGS: None IMPRESSION: Attempt at lower extremity venogram and possible treatment was aborted, as the patient could not be made comfortable with moderate sedation. Signed, Dulcy Fanny. Nadene Rubins, RPVI [facility] Radiology Electronically Signed   By: Corrie Mckusick D.O.   On: 09/23/2022 11:55    Scheduled Meds:  aspirin  81 mg Oral Daily   atorvastatin  40 mg Oral Daily   brimonidine  1 drop Both Eyes BID   Chlorhexidine Gluconate Cloth  6 each Topical Daily   cyanocobalamin  1,000 mcg Oral Daily   enoxaparin (LOVENOX) injection  40 mg Subcutaneous Q24H   gabapentin  300 mg Oral BID   gabapentin  400 mg Oral  QHS   oxyCODONE  10 mg Oral Q12H   pantoprazole  40 mg Oral Daily   polyethylene glycol  17 g Oral Daily   senna-docusate  1 tablet Oral BID   spironolactone  25 mg Oral Daily   Continuous Infusions:  methocarbamol (ROBAXIN) IV 1,000 mg (09/24/22 0009)     LOS: 6 days   Geradine Girt, DO [facility] 09/24/2022, 12:32 PM

## 2022-09-24 NOTE — Procedures (Signed)
Interventional Radiology Procedure Note  Procedure: Bilateral lower extremity venogram, venoplasty and stent placement  Indication: Bilateral common iliac vein occlusion  Findings: Please refer to procedural dictation for full description.  Complications: None  EBL: < 10 mL  Miachel Roux, MD [phone]

## 2022-09-24 NOTE — H&P (Signed)
Referring Physician(s): Feng,Yan  Supervising Physician: Corrie Mckusick  Patient Status:  [facility] - In-pt  Chief Complaint:  Rt leg swelling  Subjective:  Was scheduled with IR for Rt leg venogram with possible angioplasty/stent placement 09/23/22 Pt was unable to tolerate the procedure yesterday--- very uncomfortable; unsafe in table Plan was for reschedule to today with anesthesia  She is aware and agreeable  Allergies: Morphine and related, Latex, Tylenol with codeine #3 [acetaminophen-codeine], and Penicillins  Medications: Prior to Admission medications   Medication Sig Start Date End Date Taking? Authorizing Provider  amLODipine (NORVASC) 5 MG tablet Take 5 mg by mouth daily as needed (high B/P).   Yes [provider]  Ascorbic Acid (VITAMIN C) 500 MG CAPS Take 500 mg by mouth daily.   Yes [provider]  aspirin 81 MG chewable tablet Chew 81 mg by mouth daily.   Yes [provider]  atorvastatin (LIPITOR) 40 MG tablet Take 1 tablet (40 mg total) by mouth daily. 05/11/22  Yes [name], MD  brimonidine (ALPHAGAN) 0.2 % ophthalmic solution Place 1 drop into both eyes 2 (two) times daily. 09/19/21  Yes [provider]  carvedilol (COREG) 12.5 MG tablet TAKE 1 AND 1/2 TABLETS BY MOUTH TWICE DAILY qm Patient taking differently: Take 18.75 mg by mouth 2 (two) times daily with a meal. 06/07/22  Yes [name], MD  Cholecalciferol (VITAMIN D) 50 MCG (2000 UT) CAPS Take 2,000 Units by mouth daily.   Yes [provider]  empagliflozin (JARDIANCE) 10 MG TABS tablet Take 1 tablet (10 mg total) by mouth daily before breakfast. 07/01/22  Yes [name], [name], MD  gabapentin (NEURONTIN) 300 MG capsule Take 3 caps twice a day during the day and 4 caps at bedtime Patient taking differently: Take 600-900 mg by mouth See admin instructions. Take 2 caps ( 600 mg)  twice a day during the day and 3 caps ( 900 mg)  at bedtime 09/12/22  Yes  [name], MD  ibuprofen (ADVIL) 800 MG tablet Take 1 tablet (800 mg total) by mouth every 8 (eight) hours as needed. Patient taking differently: Take 800 mg by mouth every 8 (eight) hours as needed for mild pain. 09/07/21  Yes [name], MD  lidocaine-prilocaine (EMLA) cream Apply 1 application topically as needed. 10/23/20  Yes [name], MD  linaclotide [facility]) 72 MCG capsule Take 1 capsule (72 mcg total) by mouth daily as needed (constipation). 08/28/22  Yes Pickenpack-Cousar, Carlena Sax, NP  magic mouthwash (nystatin, lidocaine, diphenhydrAMINE, alum & mag hydroxide) suspension Swish and swallow 5 mLs by mouth 4 (four) times daily as needed for mouth pain 06/20/22  Yes [name], MD  Magnesium 300 MG CAPS Take 300 mg by mouth 2 (two) times a week.   Yes [provider]  Multiple Vitamins-Minerals (WOMENS MULTI PO) Take 15 mLs by mouth daily.   Yes [provider]  ondansetron (ZOFRAN) 8 MG tablet Take 1 tablet (8 mg total) by mouth every 8 (eight) hours as needed for nausea or vomiting (begin on day 3 after chemo). 12/21/21  Yes [name], MD  oxyCODONE (OXY IR/ROXICODONE) 5 MG immediate release tablet Take 1 tablet (5 mg total) by mouth every 6 (six) hours as needed for severe pain. 08/28/22  Yes Pickenpack-Cousar, Carlena Sax, NP  pantoprazole (PROTONIX) 40 MG tablet TAKE 1 TABLET BY MOUTH EVERY DAY Patient taking differently: Take 40 mg by mouth daily as needed (heartburn). 12/06/21  Yes Burr Medico,  Krista Blue, MD  prochlorperazine (COMPAZINE) 10 MG tablet Take 1 tablet (10 mg total) by mouth every 6 (six) hours as needed (Nausea or vomiting). 02/15/22  Yes [name], MD  promethazine (PHENERGAN) 25 MG tablet Take 1 tablet (25 mg total) by mouth every 6 (six) hours as needed for nausea or vomiting. 03/08/22  Yes [name], MD  sacubitril-valsartan (ENTRESTO) 97-103 MG Take 1 tablet by mouth 2 (two) times daily. 04/19/22  Yes [name], MD  spironolactone (ALDACTONE) 25 MG tablet Take 1 tablet (25  mg total) by mouth daily. 04/19/22  Yes [name], MD  vitamin B-12 (CYANOCOBALAMIN) 1000 MCG tablet Take 1,000 mcg by mouth daily.   Yes [provider]  zinc gluconate 50 MG tablet Take 50 mg by mouth daily.   Yes [provider]  Accu-Chek FastClix Lancets MISC TEST TWICE A DAY. PT USES AN ACCU-CHEK GUIDE ME METER 01/29/21   Henson, Vickie L, NP-C  furosemide (LASIX) 20 MG tablet TAKE 1-2 TABLETS BY MOUTH AS NEEDED FOR WEIGHT GAIN 3 TO 4 LBS OR SIGNIFICANT LEG EDEMA Patient not taking: Reported on 09/19/2022 09/06/22   [name], MD  HUMALOG KWIKPEN 100 UNIT/ML KwikPen Inject 3-10 Units into the skin 3 (three) times daily before meals. Per sliding scale Patient not taking: Reported on 09/19/2022 11/23/21   [provider]  Lancets Misc. (ACCU-CHEK SOFTCLIX LANCET DEV) KIT 1 Units by Does not apply route 2 (two) times daily. Check FSBS BID - Include strips # 50 with 5 refills, Lancets #50 with 5 refills DX: Type 2 DM - ICD 10: E11.9 10/12/21   Irene Pap, PA-C     Vital Signs: BP 99/71 (BP Location: Right Arm)   Pulse 80   Temp 98.2 F (36.8 C) (Oral)   Resp 18   Ht 5' 7"$  (1.702 m)   Wt 223 lb (101.2 kg)   SpO2 98%   BMI 34.93 kg/m   Physical Exam Vitals reviewed.  HENT:     Mouth/Throat:     Mouth: Mucous membranes are moist.  Cardiovascular:     Rate and Rhythm: Normal rate and regular rhythm.     Heart sounds: Normal heart sounds.  Pulmonary:     Effort: Pulmonary effort is normal.     Breath sounds: Normal breath sounds.  Abdominal:     Palpations: Abdomen is soft.  Musculoskeletal:     Right lower leg: Edema present.     Left lower leg: No edema.  Skin:    General: Skin is warm.  Neurological:     Mental Status: She is alert and oriented to person, place, and time.  Psychiatric:        Behavior: Behavior normal.     Imaging: IR Veno/Ext/Bi  Result Date: 09/23/2022 INDICATION: 67 year old female presents for venogram,  intravascular ultrasound, possible intervention for suspected right iliac vein occlusion and symptomatic swelling of the right lower extremity. EXAM: LOWER EXTREMITY VENOGRAM DISCONTINUATION OF LOWER EXTREMITY VENOGRAM COMPARISON:  CT 09/18/2022 MEDICATIONS: NONE ANESTHESIA/SEDATION: Moderate (conscious) sedation was employed during this procedure. A total of Versed 1.0 mg and DILAUDID 1 MG, 50 mcg fentanyl was administered intravenously by the radiology nurse. Total intra-service moderate Sedation Time: 15 minutes. The patient's level of consciousness and vital signs were monitored continuously by radiology nursing throughout the procedure under my direct supervision. FLUOROSCOPY: None COMPLICATIONS: None TECHNIQUE: Informed written consent was obtained from the patient after a thorough discussion of the procedural risks, benefits and  alternatives. All questions were addressed. Maximal Sterile Barrier Technique was utilized including caps, mask, sterile gowns, sterile gloves, sterile drape, hand hygiene and skin antiseptic. A timeout was performed prior to the initiation of the procedure. Initiation of conscious sedation was performed in order to attempt to have the patient become comfortable under the image intensifier. After medications it was clear that the patient would not be comfortable nor safe on the table. Procedure was aborted. FINDINGS: None IMPRESSION: Attempt at lower extremity venogram and possible treatment was aborted, as the patient could not be made comfortable with moderate sedation. Signed, Dulcy Fanny. Nadene Rubins, RPVI [facility] Interventional Radiology Specialists [facility] Radiology Electronically Signed   By: Corrie Mckusick D.O.   On: 09/23/2022 11:55    Labs:  CBC: Recent Labs    09/19/22 0528 09/20/22 0639 09/23/22 0237 09/24/22 0609  WBC 3.3* 3.6* 5.0 5.3  HGB 9.6* 9.4* 10.6* 10.8*  HCT 29.4* 29.0* 31.4* 32.5*  PLT 138* 169 342 459*    COAGS: Recent Labs     09/23/22 0237  INR 1.2    BMP: Recent Labs    09/18/22 1045 09/19/22 0528 09/20/22 0639 09/23/22 0237  NA 140 134* 135 133*  K 4.6 4.1 4.2 4.3  CL 109 108 107 105  CO2 25 21* 22 20*  GLUCOSE 96 85 82 96  BUN 17 16 14 15  $ CALCIUM 8.9 8.2* 8.3* 8.4*  CREATININE 1.15* 1.29* 1.17* 1.29*  GFRNONAA 53* 46* 52* 46*    LIVER FUNCTION TESTS: Recent Labs    08/14/22 1116 09/11/22 1136 09/18/22 1045 09/19/22 0528  BILITOT 0.7 0.6 0.5 0.6  AST 22 20 26 27  $ ALT 12 9 14 17  $ ALKPHOS 77 81 89 76  PROT 6.9 7.0 6.5 6.6  ALBUMIN 3.8 3.4* 3.3* 2.9*    Assessment and Plan:  Rt lower leg swelling/edema; iliac vein narrowing per imaging Scheduled for Rt leg venogram with intervention in IR today-  using anesthesia Pt is aware of procedure benefits and risks including but not limited to: Infection; vessel damage; bleeding Agreeable to proceed Consent signed  Electronically Signed: Lavonia Drafts, PA-C 09/24/2022, 7:03 AM   I spent a total of 15 Minutes at the the patient's bedside AND on the patient's hospital floor or unit, greater than 50% of which was counseling/coordinating care for Rt leg venogram with intervention

## 2022-09-24 NOTE — Progress Notes (Signed)
Patient refused chg bath wipes this morning. Marcille Blanco, RN

## 2022-09-25 DIAGNOSIS — R52 Pain, unspecified: Secondary | ICD-10-CM | POA: Diagnosis not present

## 2022-09-25 HISTORY — PX: IR VENOCAVAGRAM IVC: IMG678

## 2022-09-25 HISTORY — PX: IR TRANSCATH PLC STENT  EA ADD VEIN  INC ANGIOPLASTY: IMG5446

## 2022-09-25 LAB — CBC
HCT: 33.4 % — ABNORMAL LOW (ref 36.0–46.0)
Hemoglobin: 11.3 g/dL — ABNORMAL LOW (ref 12.0–15.0)
MCH: 30.5 pg (ref 26.0–34.0)
MCHC: 33.8 g/dL (ref 30.0–36.0)
MCV: 90.3 fL (ref 80.0–100.0)
Platelets: 543 10*3/uL — ABNORMAL HIGH (ref 150–400)
RBC: 3.7 MIL/uL — ABNORMAL LOW (ref 3.87–5.11)
RDW: 14.6 % (ref 11.5–15.5)
WBC: 6.5 10*3/uL (ref 4.0–10.5)
nRBC: 0 % (ref 0.0–0.2)

## 2022-09-25 LAB — BASIC METABOLIC PANEL
Anion gap: 6 (ref 5–15)
BUN: 13 mg/dL (ref 8–23)
CO2: 20 mmol/L — ABNORMAL LOW (ref 22–32)
Calcium: 8.4 mg/dL — ABNORMAL LOW (ref 8.9–10.3)
Chloride: 106 mmol/L (ref 98–111)
Creatinine, Ser: 1.27 mg/dL — ABNORMAL HIGH (ref 0.44–1.00)
GFR, Estimated: 47 mL/min — ABNORMAL LOW (ref >60)
Glucose, Bld: 96 mg/dL (ref 70–99)
Potassium: 4.5 mmol/L (ref 3.5–5.1)
Sodium: 132 mmol/L — ABNORMAL LOW (ref 135–145)

## 2022-09-25 MED ORDER — OXYCODONE HCL ER 15 MG PO T12A
15.0000 mg | EXTENDED_RELEASE_TABLET | Freq: Two times a day (BID) | ORAL | Status: DC
  Administered 2022-09-25 – 2022-09-26 (×3): 15 mg via ORAL
  Filled 2022-09-25 (×3): qty 1

## 2022-09-25 MED ORDER — METHOCARBAMOL 500 MG PO TABS
1000.0000 mg | ORAL_TABLET | Freq: Four times a day (QID) | ORAL | Status: DC | PRN
  Administered 2022-09-25 – 2022-09-26 (×2): 1000 mg via ORAL
  Filled 2022-09-25 (×2): qty 2

## 2022-09-25 MED ORDER — OXYCODONE HCL 5 MG PO TABS
10.0000 mg | ORAL_TABLET | ORAL | Status: DC | PRN
  Administered 2022-09-26: 10 mg via ORAL
  Filled 2022-09-25: qty 2

## 2022-09-25 MED ORDER — MUPIROCIN 2 % EX OINT
1.0000 | TOPICAL_OINTMENT | Freq: Two times a day (BID) | CUTANEOUS | Status: DC
  Administered 2022-09-25 – 2022-09-26 (×3): 1 via NASAL
  Filled 2022-09-25: qty 22

## 2022-09-25 MED ORDER — CHLORHEXIDINE GLUCONATE CLOTH 2 % EX PADS: 6.0000 | MEDICATED_PAD | Freq: Every day | CUTANEOUS | Status: DC

## 2022-09-25 NOTE — Consult Note (Signed)
   [facility] Inpatient Consult   09/25/2022  [place] Jan 07, 1956 [id number]  [facility] [employer] [ACO] Patient: [employer] Medicare  Primary Care Provider:  Jill Alexanders, MD with [facility] which is listed to provide the transition of care follow up   Patient screened for hospitalization with noted high risk score for unplanned readmission risk length of stay and to assess for potential [place] Management service needs for post hospital transition for care coordination.  Review of patient's electronic medical record reveals patient is admitted wit ongoing pain management needs  Met with patient at the bedside to check for PCP and she endorses Dr. Jill Alexanders for follow up.  She states she believes she has made a follow up appointment, she usually sees him twice or so a year.  Her main follow up is with the Oncology team. Explained the reason for the visit and offered care coordination follow up.  Patient states she is just so uncomfortable and offered to contact her nurse.  Patient states she is just trying to position herself in bed better.   Plan:  Continue to follow progress and disposition to assess for post hospital community care coordination/management needs.  Referral request for community care coordination: will determine closer to discharge.  Of note, [facility] Care Management/Population Health does not replace or interfere with any arrangements made by the Inpatient Transition of Care team.  For questions contact:   Natividad Brood, RN BSN [place]  [phone] business mobile phone Toll free office [phone]  *[place]  [phone] Fax number: [phone] [email or web address]

## 2022-09-25 NOTE — Progress Notes (Signed)
Patient has order for compression stocking. Per [facility] main supply staff, xxl which is patient's size per measurement is not available, MD notified via secure chat. Marcille Blanco, RN

## 2022-09-25 NOTE — Progress Notes (Signed)
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 Authorizing Provider  amLODipine (NORVASC) 5 MG tablet Take 5 mg by mouth daily as needed (high B/P).   Yes [provider]  Ascorbic Acid (VITAMIN C) 500 MG CAPS Take 500 mg by mouth daily.   Yes [provider]  aspirin 81 MG chewable tablet Chew 81 mg by mouth daily.   Yes [provider]  atorvastatin (LIPITOR) 40 MG tablet Take 1 tablet (40 mg total) by mouth daily. 05/11/22  Yes [name], MD  brimonidine (ALPHAGAN) 0.2 % ophthalmic solution Place 1 drop into both eyes 2 (two) times daily. 09/19/21  Yes [provider]  carvedilol (COREG) 12.5 MG tablet TAKE 1 AND 1/2 TABLETS BY MOUTH TWICE DAILY qm Patient taking differently: Take 18.75 mg by mouth 2 (two) times daily with a meal. 06/07/22  Yes [name], MD  Cholecalciferol (VITAMIN D) 50 MCG (2000 UT) CAPS Take 2,000 Units by mouth daily.   Yes [provider]  empagliflozin (JARDIANCE) 10 MG TABS tablet Take 1 tablet (10 mg total) by mouth daily before breakfast. 07/01/22  Yes [name], [name], MD  gabapentin (NEURONTIN) 300 MG capsule Take 3 caps twice a day during the day and 4 caps at bedtime Patient taking differently: Take 600-900 mg by mouth See admin instructions. Take 2 caps ( 600 mg)  twice a day during the day and 3 caps ( 900 mg)  at bedtime 09/12/22  Yes [name], MD  ibuprofen (ADVIL) 800 MG tablet Take 1 tablet (800 mg total) by mouth every 8 (eight) hours as  needed. Patient taking differently: Take 800 mg by mouth every 8 (eight) hours as needed for mild pain. 09/07/21  Yes [name], MD  lidocaine-prilocaine (EMLA) cream Apply 1 application topically as needed. 10/23/20  Yes [name], MD  linaclotide [facility]) 72 MCG capsule Take 1 capsule (72 mcg total) by mouth daily as needed (constipation). 08/28/22  Yes Pickenpack-Cousar, Carlena Sax, NP  magic mouthwash (nystatin, lidocaine, diphenhydrAMINE, alum & mag hydroxide) suspension Swish and swallow 5 mLs by mouth 4 (four) times daily as needed for mouth pain 06/20/22  Yes [name], MD  Magnesium 300 MG CAPS Take 300 mg by mouth 2 (two) times a week.   Yes [provider]  Multiple Vitamins-Minerals (WOMENS MULTI PO) Take 15 mLs by mouth daily.   Yes [provider]  ondansetron (ZOFRAN) 8 MG tablet Take 1 tablet (8 mg total) by mouth every 8 (eight) hours as needed for nausea or vomiting (begin on day 3 after chemo). 12/21/21  Yes [name], MD  oxyCODONE (OXY IR/ROXICODONE) 5 MG immediate release tablet Take 1 tablet (5 mg total) by mouth every 6 (six) hours as needed for severe pain. 08/28/22  Yes Pickenpack-Cousar, Carlena Sax, NP  pantoprazole (PROTONIX) 40 MG tablet TAKE 1 TABLET BY MOUTH EVERY DAY Patient taking differently: Take 40 mg by mouth daily as needed (heartburn). 12/06/21  Yes [name], MD  prochlorperazine (COMPAZINE) 10 MG tablet Take 1 tablet (10 mg total) by mouth every  6 (six) hours as needed (Nausea or vomiting). 02/15/22  Yes [name], MD  promethazine (PHENERGAN) 25 MG tablet Take 1 tablet (25 mg total) by mouth every 6 (six) hours as needed for nausea or vomiting. 03/08/22  Yes [name], MD  sacubitril-valsartan (ENTRESTO) 97-103 MG Take 1 tablet by mouth 2 (two) times daily. 04/19/22  Yes [name], MD  spironolactone (ALDACTONE) 25 MG tablet Take 1 tablet (25 mg total) by mouth daily. 04/19/22  Yes [name], MD  vitamin B-12 (CYANOCOBALAMIN) 1000 MCG tablet  Take 1,000 mcg by mouth daily.   Yes [provider]  zinc gluconate 50 MG tablet Take 50 mg by mouth daily.   Yes [provider]  Accu-Chek FastClix Lancets MISC TEST TWICE A DAY. PT USES AN ACCU-CHEK GUIDE ME METER 01/29/21   Henson, Vickie L, NP-C  Lancets Misc. (ACCU-CHEK SOFTCLIX LANCET DEV) KIT 1 Units by Does not apply route 2 (two) times daily. Check FSBS BID - Include strips # 50 with 5 refills, Lancets #50 with 5 refills DX: Type 2 DM - ICD 10: E11.9 10/12/21   Irene Pap, PA-C     Vital Signs: BP 104/74 (BP Location: Right Arm)   Pulse (!) 106   Temp 97.6 F (36.4 C) (Oral)   Resp 18   Ht 5' 7"$  (1.702 m)   Wt 223 lb (101.2 kg)   SpO2 95%   BMI 34.93 kg/m   Physical Exam Vitals reviewed.  Constitutional:      General: She is not in acute distress.    Appearance: She is not ill-appearing.  Pulmonary:     Effort: Pulmonary effort is normal.  Musculoskeletal:     Right lower leg: Edema present.     Comments: Massive right lower extremity edema, non-pitting. Patient states it feels slightly better than yesterday  Right foot with intact sensation, foot is slightly cool with delayed capillary refill.  Left foot is neurovascularly intact.   Skin:    General: Skin is warm and dry.     Comments: Right IJ puncture site is soft, non-tender, no concerns at this time. Appropriately dressed.  Left femoral vein puncture site is soft, non-tender, no concerns at this time. Appropriately dressed.  Neurological:     Mental Status: She is alert and oriented to person, place, and time.  Psychiatric:        Mood and Affect: Mood normal.        Behavior: Behavior normal.        Thought Content: Thought content normal.        Judgment: Judgment normal.      Imaging: IR Veno/Ext/Uni Right  Result Date: 09/25/2022 INDICATION: 67 year old woman with severe right lower extremity pain and swelling. CT of the abdomen and pelvis shows severe narrowing of both  common iliac arteries. No right lower extremity DVT identified on Doppler examination. IR consulted for bilateral lower extremity venogram with possible venoplasty/stent placement EXAM: 1. Ultrasound guided access of right internal jugular vein (x2) 2. Ultrasound-guided access of left common femoral vein 3. Bilateral lower extremity venogram 4. Inferior vena cavogram 5. Bilateral lower extremity angioplasty and stent placement 6. IVUS evaluation of bilateral external and common iliac veins and IVC COMPARISON:  None Available. MEDICATIONS: None. ANESTHESIA/SEDATION: MAC sedation performed and monitored by the anesthesia team. FLUOROSCOPY: Radiation Exposure Index (as provided by the fluoroscopic device): 123456 mGy Kerma COMPLICATIONS: None immediate. FIRST ASSISTANT: Dr. Ruthann Cancer TECHNIQUE: Informed written consent was obtained  from the patient after a thorough discussion of the procedural risks, benefits and alternatives. All questions were addressed. Maximal Sterile Barrier Technique was utilized including caps, mask, sterile gowns, sterile gloves, sterile drape, hand hygiene and skin antiseptic. A timeout was performed prior to the initiation of the procedure. Right neck and left groin skin prepped and draped in a sterile fashion. Ultrasound image documenting patency of the right internal jugular vein was obtained and placed in permanent medical record. Sterile ultrasound probe cover and gel utilized throughout the procedure. Utilizing continuous ultrasound guidance, the right internal jugular vein was accessed with a 21 gauge needle. 21 gauge needle exchanged for a transitional dilator set over 0.018 inch guidewire. Transitional dilator set exchanged for 9 French sheath over 0.035 inch guidewire. Kumpe catheter advanced to the right external iliac vein. 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 The severe focal  stenosis was confirmed with intravascular ultrasound. Intravascular ultrasound was also utilized to measure the diameter of normal vein proximal and distal to the site of stenosis. Ultrasound image documenting patency of the left common femoral vein was obtained and placed in permanent medical record. Sterile ultrasound probe cover and gel utilized throughout the procedure. Utilizing continuous ultrasound guidance, the left common femoral vein was accessed at the level of the femoral head with a 21 gauge needle. 21 gauge needle exchanged for a transitional dilator set over 0.018 inch guidewire. Transitional dilator set exchanged for 9 French sheath over 0.035 inch guidewire. Kumpe catheter advanced to the left external iliac vein. 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 Intravascular ultrasound evaluation of this region confirmed severe focal stenosis in the superior segment of the left common iliac vein. Intravascular ultrasound was utilized to determine normal diameter of inferior vena cava and common iliac vein distal and proximal to the stenosed segment. Ultrasound image documenting patency of the right internal jugular vein was obtained and placed in permanent medical record. Sterile ultrasound probe cover and gel utilized throughout the procedure. Utilizing continuous ultrasound guidance, the right internal jugular vein was accessed a second time with a 21 gauge needle. 21 gauge needle exchanged for a transitional dilator set over 0.018 inch guidewire. Transitional dilator set exchanged for 9 French sheath over 0.035 inch guidewire. Kumpe catheter and guidewire were advanced across the left common iliac vein. Contrast administered through the Kumpe the catheter at the level of the left external iliac vein confirmed appropriate intraluminal positioning. The stenosed segments of the  common iliac veins bilaterally were dilated with 10 mm balloon. Bilateral 14 mm x 150 mm Abre stents were simultaneously deployed across the stenosed common iliac veins. The stents were gently simultaneously post dilated with 14 mm balloons. Bilateral lower extremity venogram was performed after post dilation of the stents, which showed excellent unobstructed flow to the inferior vena cava. No significant narrowing of the IVC was identified. Both stented segments were again evaluated with intravascular ultrasound. There was mild narrowing of the right and moderate narrowing of the left stent within the inferior vena cava, however given brisk flow through the uncovered stents, no additional venoplasty was performed. Right IJ and left CFV accesses were removed and hemostasis achieved with manual compression. 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 Electronically Signed   By: Miachel Roux M.D.   On: 09/25/2022 10:49   IR US Guide Vasc Access Right  Result Date: 09/25/2022 INDICATION: 67 year old woman with severe right lower extremity pain and swelling. CT of the abdomen and pelvis shows severe narrowing of both common iliac arteries. No right lower extremity DVT identified on Doppler examination. IR consulted for bilateral lower extremity venogram with possible venoplasty/stent placement EXAM: 1. Ultrasound guided access of right internal jugular vein (x2) 2. Ultrasound-guided access of left common femoral vein 3. Bilateral lower extremity venogram 4. Inferior vena cavogram 5. Bilateral lower extremity angioplasty and stent placement 6. IVUS evaluation of bilateral external and common iliac veins and IVC COMPARISON:  None Available. MEDICATIONS: None. ANESTHESIA/SEDATION: MAC sedation performed and monitored by the anesthesia team. FLUOROSCOPY: Radiation  Exposure Index (as provided by the fluoroscopic device): 123456 mGy Kerma COMPLICATIONS: None immediate. FIRST ASSISTANT: Dr. Ruthann Cancer TECHNIQUE: Informed written consent was obtained from the patient after a thorough discussion of the procedural risks, benefits and alternatives. All questions were addressed. Maximal Sterile Barrier Technique was utilized including caps, mask, sterile gowns, sterile gloves, sterile drape, hand hygiene and skin antiseptic. A timeout was performed prior to the initiation of the procedure. Right neck and left groin skin prepped and draped in a sterile fashion. Ultrasound image documenting patency of the right internal jugular vein was obtained and placed in permanent medical record. Sterile ultrasound probe cover and gel utilized throughout the procedure. Utilizing continuous ultrasound guidance, the right internal jugular vein was accessed with a 21 gauge needle. 21 gauge needle exchanged for a transitional dilator set over 0.018 inch guidewire. Transitional dilator set exchanged for 9 French sheath over 0.035 inch guidewire. Kumpe catheter advanced to the right external iliac vein. 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 The severe focal stenosis was confirmed with intravascular ultrasound. Intravascular ultrasound was also utilized to measure the diameter of normal vein proximal and distal to the site of stenosis. Ultrasound image documenting patency of the left common femoral vein was obtained and placed in permanent medical record. Sterile ultrasound probe cover and gel utilized throughout the procedure. Utilizing continuous ultrasound guidance, the left common femoral vein was accessed at the level of the femoral head with a 21 gauge needle. 21 gauge needle exchanged for a transitional dilator set over 0.018 inch guidewire. Transitional dilator set exchanged for 9 French sheath  over 0.035 inch guidewire. Kumpe catheter advanced to the left external iliac vein. 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 Intravascular ultrasound evaluation of this region confirmed severe focal stenosis in the superior segment of the left common iliac vein. Intravascular ultrasound was utilized to determine normal diameter of inferior vena cava and common iliac vein distal and proximal to the stenosed segment. Ultrasound image documenting patency of the right internal jugular vein was obtained and placed in permanent medical record. Sterile ultrasound probe cover and gel utilized throughout the procedure. Utilizing continuous ultrasound guidance, the right internal jugular vein was accessed a second time with a 21 gauge needle. 21 gauge needle exchanged for a transitional dilator set over 0.018 inch guidewire. Transitional dilator set exchanged for 9 French sheath over 0.035 inch guidewire. Kumpe catheter and guidewire were advanced across the left  common iliac vein. Contrast administered through the Kumpe the catheter at the level of the left external iliac vein confirmed appropriate intraluminal positioning. The stenosed segments of the common iliac veins bilaterally were dilated with 10 mm balloon. Bilateral 14 mm x 150 mm Abre stents were simultaneously deployed across the stenosed common iliac veins. The stents were gently simultaneously post dilated with 14 mm balloons. Bilateral lower extremity venogram was performed after post dilation of the stents, which showed excellent unobstructed flow to the inferior vena cava. No significant narrowing of the IVC was identified. Both stented segments were again evaluated with intravascular ultrasound. There was mild narrowing of the right and moderate narrowing of the left stent within the inferior vena cava, however given brisk flow  through the uncovered stents, no additional venoplasty was performed. Right IJ and left CFV accesses were removed and hemostasis achieved with manual compression. 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 Electronically Signed   By: Miachel Roux M.D.   On: 09/25/2022 10:49   IR IVUS EACH ADDITIONAL NON CORONARY VESSEL  Result Date: 09/25/2022 INDICATION: 67 year old woman with severe right lower extremity pain and swelling. CT of the abdomen and pelvis shows severe narrowing of both common iliac arteries. No right lower extremity DVT identified on Doppler examination. IR consulted for bilateral lower extremity venogram with possible venoplasty/stent placement EXAM: 1. Ultrasound guided access of right internal jugular vein (x2) 2. Ultrasound-guided access of left common femoral vein 3. Bilateral lower extremity venogram 4. Inferior vena cavogram 5. Bilateral lower extremity angioplasty and stent placement 6. IVUS evaluation of bilateral external and common iliac veins and IVC COMPARISON:  None Available. MEDICATIONS: None. ANESTHESIA/SEDATION: MAC sedation performed and monitored by the anesthesia team. FLUOROSCOPY: Radiation Exposure Index (as provided by the fluoroscopic device): 123456 mGy Kerma COMPLICATIONS: None immediate. FIRST ASSISTANT: Dr. Ruthann Cancer TECHNIQUE: Informed written consent was obtained from the patient after a thorough discussion of the procedural risks, benefits and alternatives. All questions were addressed. Maximal Sterile Barrier Technique was utilized including caps, mask, sterile gowns, sterile gloves, sterile drape, hand hygiene and skin antiseptic. A timeout was performed prior to the initiation of the procedure. Right neck and left groin skin prepped and draped in a sterile fashion. Ultrasound image documenting patency of the  right internal jugular vein was obtained and placed in permanent medical record. Sterile ultrasound probe cover and gel utilized throughout the procedure. Utilizing continuous ultrasound guidance, the right internal jugular vein was accessed with a 21 gauge needle. 21 gauge needle exchanged for a transitional dilator set over 0.018 inch guidewire. Transitional dilator set exchanged for 9 French sheath over 0.035 inch guidewire. Kumpe catheter advanced to the right external iliac vein. 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 The severe focal stenosis was confirmed with intravascular ultrasound. Intravascular ultrasound was also utilized to measure the diameter of normal vein proximal and distal to the site of stenosis. Ultrasound image documenting patency of the left common femoral vein was obtained and placed in permanent medical record. Sterile ultrasound probe cover and gel utilized throughout the procedure. Utilizing continuous ultrasound guidance, the left common femoral vein was accessed at the level of the femoral head with a 21 gauge needle. 21 gauge needle exchanged for a transitional dilator set over 0.018 inch guidewire. Transitional dilator  set exchanged for 9 French sheath over 0.035 inch guidewire. Kumpe catheter advanced to the left external iliac vein. 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 Intravascular ultrasound evaluation of this region confirmed severe focal stenosis in the superior segment of the left common iliac vein. Intravascular ultrasound was utilized to determine normal diameter of inferior vena cava and common iliac vein distal and proximal to the stenosed segment. Ultrasound image documenting patency of the right internal jugular vein was obtained and placed  in permanent medical record. Sterile ultrasound probe cover and gel utilized throughout the procedure. Utilizing continuous ultrasound guidance, the right internal jugular vein was accessed a second time with a 21 gauge needle. 21 gauge needle exchanged for a transitional dilator set over 0.018 inch guidewire. Transitional dilator set exchanged for 9 French sheath over 0.035 inch guidewire. Kumpe catheter and guidewire were advanced across the left common iliac vein. Contrast administered through the Kumpe the catheter at the level of the left external iliac vein confirmed appropriate intraluminal positioning. The stenosed segments of the common iliac veins bilaterally were dilated with 10 mm balloon. Bilateral 14 mm x 150 mm Abre stents were simultaneously deployed across the stenosed common iliac veins. The stents were gently simultaneously post dilated with 14 mm balloons. Bilateral lower extremity venogram was performed after post dilation of the stents, which showed excellent unobstructed flow to the inferior vena cava. No significant narrowing of the IVC was identified. Both stented segments were again evaluated with intravascular ultrasound. There was mild narrowing of the right and moderate narrowing of the left stent within the inferior vena cava, however given brisk flow through the uncovered stents, no additional venoplasty was performed. Right IJ and left CFV accesses were removed and hemostasis achieved with manual compression. 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 Electronically Signed   By: Miachel Roux M.D.   On: 09/25/2022 10:49   IR IVUS EACH ADDITIONAL NON CORONARY VESSEL  Result Date: 09/25/2022 INDICATION: 67 year old woman with severe right lower extremity pain and swelling. CT of the abdomen and pelvis shows severe narrowing of  both common iliac arteries. No right lower extremity DVT identified on Doppler examination. IR consulted for bilateral lower extremity venogram with possible venoplasty/stent placement EXAM: 1. Ultrasound guided access of right internal jugular vein (x2) 2. Ultrasound-guided access of left common femoral vein 3. Bilateral lower extremity venogram 4. Inferior vena cavogram 5. Bilateral lower extremity angioplasty and stent placement 6. IVUS evaluation of bilateral external and common iliac veins and IVC COMPARISON:  None Available. MEDICATIONS: None. ANESTHESIA/SEDATION: MAC sedation performed and monitored by the anesthesia team. FLUOROSCOPY: Radiation Exposure Index (as provided by the fluoroscopic device): 123456 mGy Kerma COMPLICATIONS: None immediate. FIRST ASSISTANT: Dr. Ruthann Cancer TECHNIQUE: Informed written consent was obtained from the patient after a thorough discussion of the procedural risks, benefits and alternatives. All questions were addressed. Maximal Sterile Barrier Technique was utilized including caps, mask, sterile gowns, sterile gloves, sterile drape, hand hygiene and skin antiseptic. A timeout was performed prior to the initiation of the procedure. Right neck and left groin skin prepped and draped in a sterile fashion. Ultrasound image documenting patency of the right internal jugular vein was obtained and placed in permanent medical record. Sterile ultrasound probe cover and gel  utilized throughout the procedure. Utilizing continuous ultrasound guidance, the right internal jugular vein was accessed with a 21 gauge needle. 21 gauge needle exchanged for a transitional dilator set over 0.018 inch guidewire. Transitional dilator set exchanged for 9 French sheath over 0.035 inch guidewire. Kumpe catheter advanced to the right external iliac vein. 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 The severe  focal stenosis was confirmed with intravascular ultrasound. Intravascular ultrasound was also utilized to measure the diameter of normal vein proximal and distal to the site of stenosis. Ultrasound image documenting patency of the left common femoral vein was obtained and placed in permanent medical record. Sterile ultrasound probe cover and gel utilized throughout the procedure. Utilizing continuous ultrasound guidance, the left common femoral vein was accessed at the level of the femoral head with a 21 gauge needle. 21 gauge needle exchanged for a transitional dilator set over 0.018 inch guidewire. Transitional dilator set exchanged for 9 French sheath over 0.035 inch guidewire. Kumpe catheter advanced to the left external iliac vein. 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 Intravascular ultrasound evaluation of this region confirmed severe focal stenosis in the superior segment of the left common iliac vein. Intravascular ultrasound was utilized to determine normal diameter of inferior vena cava and common iliac vein distal and proximal to the stenosed segment. Ultrasound image documenting patency of the right internal jugular vein was obtained and placed in permanent medical record. Sterile ultrasound probe cover and gel utilized throughout the procedure. Utilizing continuous ultrasound guidance, the right internal jugular vein was accessed a second time with a 21 gauge needle. 21 gauge needle exchanged for a transitional dilator set over 0.018 inch guidewire. Transitional dilator set exchanged for 9 French sheath over 0.035 inch guidewire. Kumpe catheter and guidewire were advanced across the left common iliac vein. Contrast administered through the Kumpe the catheter at the level of the left external iliac vein confirmed appropriate intraluminal positioning. The stenosed segments  of the common iliac veins bilaterally were dilated with 10 mm balloon. Bilateral 14 mm x 150 mm Abre stents were simultaneously deployed across the stenosed common iliac veins. The stents were gently simultaneously post dilated with 14 mm balloons. Bilateral lower extremity venogram was performed after post dilation of the stents, which showed excellent unobstructed flow to the inferior vena cava. No significant narrowing of the IVC was identified. Both stented segments were again evaluated with intravascular ultrasound. There was mild narrowing of the right and moderate narrowing of the left stent within the inferior vena cava, however given brisk flow through the uncovered stents, no additional venoplasty was performed. Right IJ and left CFV accesses were removed and hemostasis achieved with manual compression. 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 Electronically Signed   By: Miachel Roux M.D.   On: 09/25/2022 10:49   IR Veno/Ext/Uni Left  Result Date: 09/25/2022 INDICATION: 67 year old woman with severe right lower extremity pain and swelling. CT of the abdomen and pelvis shows severe narrowing of both common iliac arteries. No right lower extremity DVT identified on Doppler examination. IR consulted for bilateral lower extremity venogram with possible venoplasty/stent placement EXAM: 1. Ultrasound guided access of right internal jugular vein (  x2) 2. Ultrasound-guided access of left common femoral vein 3. Bilateral lower extremity venogram 4. Inferior vena cavogram 5. Bilateral lower extremity angioplasty and stent placement 6. IVUS evaluation of bilateral external and common iliac veins and IVC COMPARISON:  None Available. MEDICATIONS: None. ANESTHESIA/SEDATION: MAC sedation performed and monitored by the anesthesia team. FLUOROSCOPY: Radiation  Exposure Index (as provided by the fluoroscopic device): 123456 mGy Kerma COMPLICATIONS: None immediate. FIRST ASSISTANT: Dr. Ruthann Cancer TECHNIQUE: Informed written consent was obtained from the patient after a thorough discussion of the procedural risks, benefits and alternatives. All questions were addressed. Maximal Sterile Barrier Technique was utilized including caps, mask, sterile gowns, sterile gloves, sterile drape, hand hygiene and skin antiseptic. A timeout was performed prior to the initiation of the procedure. Right neck and left groin skin prepped and draped in a sterile fashion. Ultrasound image documenting patency of the right internal jugular vein was obtained and placed in permanent medical record. Sterile ultrasound probe cover and gel utilized throughout the procedure. Utilizing continuous ultrasound guidance, the right internal jugular vein was accessed with a 21 gauge needle. 21 gauge needle exchanged for a transitional dilator set over 0.018 inch guidewire. Transitional dilator set exchanged for 9 French sheath over 0.035 inch guidewire. Kumpe catheter advanced to the right external iliac vein. 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 The severe focal stenosis was confirmed with intravascular ultrasound. Intravascular ultrasound was also utilized to measure the diameter of normal vein proximal and distal to the site of stenosis. Ultrasound image documenting patency of the left common femoral vein was obtained and placed in permanent medical record. Sterile ultrasound probe cover and gel utilized throughout the procedure. Utilizing continuous ultrasound guidance, the left common femoral vein was accessed at the level of the femoral head with a 21 gauge needle. 21 gauge needle exchanged for a transitional dilator set over 0.018 inch guidewire. Transitional dilator set exchanged for 9 French sheath  over 0.035 inch guidewire. Kumpe catheter advanced to the left external iliac vein. 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 Intravascular ultrasound evaluation of this region confirmed severe focal stenosis in the superior segment of the left common iliac vein. Intravascular ultrasound was utilized to determine normal diameter of inferior vena cava and common iliac vein distal and proximal to the stenosed segment. Ultrasound image documenting patency of the right internal jugular vein was obtained and placed in permanent medical record. Sterile ultrasound probe cover and gel utilized throughout the procedure. Utilizing continuous ultrasound guidance, the right internal jugular vein was accessed a second time with a 21 gauge needle. 21 gauge needle exchanged for a transitional dilator set over 0.018 inch guidewire. Transitional dilator set exchanged for 9 French sheath over 0.035 inch guidewire. Kumpe catheter and guidewire were advanced across the left common iliac vein. Contrast administered through the Kumpe the catheter at the level of the left external iliac vein confirmed appropriate intraluminal positioning. The stenosed segments of the common iliac veins bilaterally were dilated with 10 mm balloon. Bilateral 14 mm x 150 mm Abre stents were simultaneously deployed across the stenosed common iliac veins. The stents were gently simultaneously post dilated with 14 mm balloons. Bilateral lower extremity venogram was performed after post dilation of the stents, which showed excellent unobstructed flow to the inferior vena cava. No  significant narrowing of the IVC was identified. Both stented segments were again evaluated with intravascular ultrasound. There was mild narrowing of the right and moderate narrowing of the left stent within the inferior vena cava, however given brisk flow  through the uncovered stents, no additional venoplasty was performed. Right IJ and left CFV accesses were removed and hemostasis achieved with manual compression. 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 Electronically Signed   By: Miachel Roux M.D.   On: 09/25/2022 10:49   IR TRANSCATH PLC STENT  INITIAL VEIN  INC ANGIOPLASTY  Result Date: 09/25/2022 INDICATION: 67 year old woman with severe right lower extremity pain and swelling. CT of the abdomen and pelvis shows severe narrowing of both common iliac arteries. No right lower extremity DVT identified on Doppler examination. IR consulted for bilateral lower extremity venogram with possible venoplasty/stent placement EXAM: 1. Ultrasound guided access of right internal jugular vein (x2) 2. Ultrasound-guided access of left common femoral vein 3. Bilateral lower extremity venogram 4. Inferior vena cavogram 5. Bilateral lower extremity angioplasty and stent placement 6. IVUS evaluation of bilateral external and common iliac veins and IVC COMPARISON:  None Available. MEDICATIONS: None. ANESTHESIA/SEDATION: MAC sedation performed and monitored by the anesthesia team. FLUOROSCOPY: Radiation Exposure Index (as provided by the fluoroscopic device): 123456 mGy Kerma COMPLICATIONS: None immediate. FIRST ASSISTANT: Dr. Ruthann Cancer TECHNIQUE: Informed written consent was obtained from the patient after a thorough discussion of the procedural risks, benefits and alternatives. All questions were addressed. Maximal Sterile Barrier Technique was utilized including caps, mask, sterile gowns, sterile gloves, sterile drape, hand hygiene and skin antiseptic. A timeout was performed prior to the initiation of the procedure. Right neck and left groin skin prepped and draped in a sterile fashion. Ultrasound image documenting  patency of the right internal jugular vein was obtained and placed in permanent medical record. Sterile ultrasound probe cover and gel utilized throughout the procedure. Utilizing continuous ultrasound guidance, the right internal jugular vein was accessed with a 21 gauge needle. 21 gauge needle exchanged for a transitional dilator set over 0.018 inch guidewire. Transitional dilator set exchanged for 9 French sheath over 0.035 inch guidewire. Kumpe catheter advanced to the right external iliac vein. 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 The severe focal stenosis was confirmed with intravascular ultrasound. Intravascular ultrasound was also utilized to measure the diameter of normal vein proximal and distal to the site of stenosis. Ultrasound image documenting patency of the left common femoral vein was obtained and placed in permanent medical record. Sterile ultrasound probe cover and gel utilized throughout the procedure. Utilizing continuous ultrasound guidance, the left common femoral vein was accessed at the level of the femoral head with a 21 gauge needle. 21 gauge needle exchanged for a transitional dilator set over 0.018 inch guidewire. Transitional dilator set exchanged for 9 French sheath over 0.035 inch guidewire. Kumpe catheter advanced to the left external iliac vein. 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 Intravascular ultrasound evaluation of this region confirmed severe focal stenosis in the superior segment of the left common iliac vein. Intravascular ultrasound was utilized to determine normal diameter of inferior vena cava and common iliac vein distal and proximal  to the stenosed segment. Ultrasound image documenting patency of the right internal jugular vein was  obtained and placed in permanent medical record. Sterile ultrasound probe cover and gel utilized throughout the procedure. Utilizing continuous ultrasound guidance, the right internal jugular vein was accessed a second time with a 21 gauge needle. 21 gauge needle exchanged for a transitional dilator set over 0.018 inch guidewire. Transitional dilator set exchanged for 9 French sheath over 0.035 inch guidewire. Kumpe catheter and guidewire were advanced across the left common iliac vein. Contrast administered through the Kumpe the catheter at the level of the left external iliac vein confirmed appropriate intraluminal positioning. The stenosed segments of the common iliac veins bilaterally were dilated with 10 mm balloon. Bilateral 14 mm x 150 mm Abre stents were simultaneously deployed across the stenosed common iliac veins. The stents were gently simultaneously post dilated with 14 mm balloons. Bilateral lower extremity venogram was performed after post dilation of the stents, which showed excellent unobstructed flow to the inferior vena cava. No significant narrowing of the IVC was identified. Both stented segments were again evaluated with intravascular ultrasound. There was mild narrowing of the right and moderate narrowing of the left stent within the inferior vena cava, however given brisk flow through the uncovered stents, no additional venoplasty was performed. Right IJ and left CFV accesses were removed and hemostasis achieved with manual compression. 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 Electronically Signed   By: Miachel Roux M.D.   On: 09/25/2022 10:49   IR Veno/Ext/Bi  Result Date: 09/23/2022 INDICATION: 67 year old female presents for venogram, intravascular ultrasound, possible intervention for suspected right iliac vein occlusion and  symptomatic swelling of the right lower extremity. EXAM: LOWER EXTREMITY VENOGRAM DISCONTINUATION OF LOWER EXTREMITY VENOGRAM COMPARISON:  CT 09/18/2022 MEDICATIONS: NONE ANESTHESIA/SEDATION: Moderate (conscious) sedation was employed during this procedure. A total of Versed 1.0 mg and DILAUDID 1 MG, 50 mcg fentanyl was administered intravenously by the radiology nurse. Total intra-service moderate Sedation Time: 15 minutes. The patient's level of consciousness and vital signs were monitored continuously by radiology nursing throughout the procedure under my direct supervision. FLUOROSCOPY: None COMPLICATIONS: None TECHNIQUE: Informed written consent was obtained from the patient after a thorough discussion of the procedural risks, benefits and alternatives. All questions were addressed. Maximal Sterile Barrier Technique was utilized including caps, mask, sterile gowns, sterile gloves, sterile drape, hand hygiene and skin antiseptic. A timeout was performed prior to the initiation of the procedure. Initiation of conscious sedation was performed in order to attempt to have the patient become comfortable under the image intensifier. After medications it was clear that the patient would not be comfortable nor safe on the table. Procedure was aborted. FINDINGS: None IMPRESSION: Attempt at lower extremity venogram and possible treatment was aborted, as the patient could not be made comfortable with moderate sedation. Signed, Dulcy Fanny. Nadene Rubins, RPVI [facility] Radiology Electronically Signed   By: Corrie Mckusick D.O.   On: 09/23/2022 11:55    Labs:  CBC: Recent Labs    09/20/22 0639 09/23/22 0237 09/24/22 0609 09/25/22 0653  WBC 3.6* 5.0 5.3 6.5  HGB 9.4* 10.6* 10.8* 11.3*  HCT 29.0* 31.4* 32.5* 33.4*  PLT 169 342 459* 543*    COAGS: Recent Labs    09/23/22 0237  INR 1.2    BMP: Recent Labs  09/20/22 0639 09/23/22 0237 09/24/22 0609  09/25/22 0653  NA 135 133* 132* 132*  K 4.2 4.3 4.5 4.5  CL 107 105 105 106  CO2 22 20* 21* 20*  GLUCOSE 82 96 99 96  BUN 14 15 15 13  $ CALCIUM 8.3* 8.4* 8.4* 8.4*  CREATININE 1.17* 1.29* 1.36* 1.27*  GFRNONAA 52* 46* 43* 47*    LIVER FUNCTION TESTS: Recent Labs    08/14/22 1116 09/11/22 1136 09/18/22 1045 09/19/22 0528  BILITOT 0.7 0.6 0.5 0.6  AST 22 20 26 27  $ ALT 12 9 14 17  $ ALKPHOS 77 81 89 76  PROT 6.9 7.0 6.5 6.6  ALBUMIN 3.8 3.4* 3.3* 2.9*    Assessment and Plan:  Kareen Willow is 1 day s/p bilateral lower extremity angioplasty and stenting for bilateral common iliac vein occlusions. Overall, she reports feeling slightly better than she did before the procedure. Right lower extremity is still markedly edematous, though the patient reports it might be slightly better today. No concerns at her right IJ and left femoral vein puncture sites. Per a discussion with Dr Sloan Leiter, a compression sock will be attempted to be fitted onto the right lower extremity to address edema. IR will continue to follow, please contact IR team with any questions or concerns.  Electronically Signed: Lura Em, PA-C 09/25/2022, 3:39 PM   I spent a total of 25 Minutes at the the patient's bedside AND on the patient's hospital floor or unit, greater than 50% of which was counseling/coordinating care for bilateral common iliac vein occlusions.

## 2022-09-25 NOTE — Progress Notes (Signed)
PROGRESS NOTE    Kristin Ward  [id number] DOB: 1955-08-31 DOA: 09/18/2022 PCP: [name], MD    Brief Narrative:  67 year old with type 2 diabetes, chronic combined heart failure, hypertension hyperlipidemia, breast cancer with mets to bone, cancer related pain, peripheral neuropathy secondary to chemotherapy was sent from cancer center for admission of management of progressive worsening right lower extremity edema and pain.  She was transferred to [facility] for IR guided right lower extremity venography and angioplasty.  Remains in the hospital.  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  Pain control remains an issue.   Assessment & Plan:   Right lower extremity pain with edema: Extensive edema of the right lower extremity related to right iliac vein compression in the pelvis likely secondary to metastatic cancer around the pelvis and right groin. Venous duplex is negative for DVT of the legs. IR-right lower extremity venogram with angioplasty and venous stenting 2/13.  Remains on aspirin now.  Mobilize.  Elevate right leg.  Metastatic left breast cancer, metastatic pain: Patient apparently on chemotherapy, planning for palliative radiation. Pain control remains an issue.  She is still using IV Dilaudid. Increase OxyContin to 15 mg twice daily, oxycodone 5 mg as needed every 4 hours.  Bowel regimen to continue aggressively to avoid constipation.  Mobilize today.  If her pain is controlled with oral pain medications, will discharge home tomorrow with increased dose of OxyContin.  She will follow-up with oncology.  Abnormal finding of urinary bladder on imaging: CT scan abdomen pelvis showed focal area of thickened appearance of the right posterior bladder.  Mild right hydronephrosis.  This case was discussed with oncology and urology, they will follow outpatient.  Chronic combined  heart failure: Compensated.  On furosemide, Entresto and carvedilol. Essential hypertension: Stable on carvedilol. Hyperlipidemia: On Lipitor. Peripheral neuropathy: On gabapentin.  Start mobilizing.  Attempt oral pain medications today.  Anticipate home tomorrow. She can transfer to MedSurg bed to facilitate mobility and discharge readiness.     DVT prophylaxis: Place and maintain sequential compression device Start: 09/21/22 2224 enoxaparin (LOVENOX) injection 40 mg Start: 09/18/22 2200   Code Status: Full code Family Communication: Daughter on the FaceTime Disposition Plan: Status is: Inpatient Remains inpatient appropriate because: Uncontrolled pain due to cancer.     Consultants:  Oncology IR  Procedures:  As above.  IR procedure.  Antimicrobials:  None   Subjective: Patient seen in the morning rounds.  Her daughter was on the phone.  Patient and daughter tell me that they do not know why her right leg is so big.  She has diffuse pain mostly from groin to knee.  Had bowel movement yesterday.  She feels like she can get up and walk.  Multiple questions were answered.  Wants to try if oral pain medication works today.  Objective: Vitals:   09/24/22 1845 09/24/22 1957 09/25/22 0403 09/25/22 1048  BP: 136/88 (!) 145/90 113/76 104/74  Pulse: 100 (!) 104 (!) 123 (!) 106  Resp: 20 (!) 22 (!) 22 18  Temp: 97.8 F (36.6 C) 97.9 F (36.6 C) (!) 97.5 F (36.4 C) 97.6 F (36.4 C)  TempSrc:  Oral Oral Oral  SpO2: 95%   95%  Weight:      Height:        Intake/Output Summary (Last 24 hours) at 09/25/2022 1053 Last data filed at 09/25/2022 0900 Gross per 24 hour  Intake 520  ml  Output --  Net 520 ml   Filed Weights   09/22/22 0405 09/23/22 0437 09/24/22 0126  Weight: 103.8 kg 102.5 kg 101.2 kg    Examination:  General exam: Appears calm and comfortable, appropriately anxious. Respiratory system: Clear to auscultation. Respiratory effort normal.  On room  air. Cardiovascular system: S1 & S2 heard, RRR.  Patient has a Port-A-Cath on right chest wall. Gastrointestinal system: Abdomen is nondistended, soft and nontender. No organomegaly or masses felt. Normal bowel sounds heard. Central nervous system: Alert and oriented. No focal neurological deficits. Extremities:  Huge  edematous right thigh and right leg, nonpitting edema.  Difficult to palpate pedal pulses, however distal neurovascular status is intact.    Data Reviewed: I have personally reviewed following labs and imaging studies  CBC: Recent Labs  Lab 09/19/22 0528 09/20/22 0639 09/23/22 0237 09/24/22 0609 09/25/22 0653  WBC 3.3* 3.6* 5.0 5.3 6.5  NEUTROABS  --   --  3.4  --   --   HGB 9.6* 9.4* 10.6* 10.8* 11.3*  HCT 29.4* 29.0* 31.4* 32.5* 33.4*  MCV 93.3 93.2 91.0 90.8 90.3  PLT 138* 169 342 459* [id number]*   Basic Metabolic Panel: Recent Labs  Lab 09/19/22 0528 09/20/22 0639 09/23/22 0237 09/24/22 0609 09/25/22 0653  NA 134* 135 133* 132* 132*  K 4.1 4.2 4.3 4.5 4.5  CL 108 107 105 105 106  CO2 21* 22 20* 21* 20*  GLUCOSE 85 82 96 99 96  BUN 16 14 15 15 13  $ CREATININE 1.29* 1.17* 1.29* 1.36* 1.27*  CALCIUM 8.2* 8.3* 8.4* 8.4* 8.4*   GFR: Estimated Creatinine Clearance: 54.7 mL/min (A) (by C-G formula based on SCr of 1.27 mg/dL (H)). Liver Function Tests: Recent Labs  Lab 09/19/22 0528  AST 27  ALT 17  ALKPHOS 76  BILITOT 0.6  PROT 6.6  ALBUMIN 2.9*   No results for input(s): "LIPASE", "AMYLASE" in the last 168 hours. No results for input(s): "AMMONIA" in the last 168 hours. Coagulation Profile: Recent Labs  Lab 09/23/22 0237  INR 1.2   Cardiac Enzymes: No results for input(s): "CKTOTAL", "CKMB", "CKMBINDEX", "TROPONINI" in the last 168 hours. BNP (last 3 results) No results for input(s): "PROBNP" in the last 8760 hours. HbA1C: No results for input(s): "HGBA1C" in the last 72 hours. CBG: Recent Labs  Lab 09/18/22 2101 09/19/22 0731  09/24/22 1800  GLUCAP 95 90 109*   Lipid Profile: No results for input(s): "CHOL", "HDL", "LDLCALC", "TRIG", "CHOLHDL", "LDLDIRECT" in the last 72 hours. Thyroid Function Tests: No results for input(s): "TSH", "T4TOTAL", "FREET4", "T3FREE", "THYROIDAB" in the last 72 hours. Anemia Panel: No results for input(s): "VITAMINB12", "FOLATE", "FERRITIN", "TIBC", "IRON", "RETICCTPCT" in the last 72 hours. Sepsis Labs: No results for input(s): "PROCALCITON", "LATICACIDVEN" in the last 168 hours.  Recent Results (from the past 240 hour(s))  Surgical pcr screen     Status: Abnormal   Collection Time: 09/24/22  4:37 PM   Specimen: Nasal Mucosa; Nasal Swab  Result Value Ref Range Status   MRSA, PCR NEGATIVE NEGATIVE Final   Staphylococcus aureus POSITIVE (A) NEGATIVE Final    Comment: (NOTE) The Xpert SA Assay (FDA approved for NASAL specimens in patients 81 years of age and older), is one component of a comprehensive surveillance program. It is not intended to diagnose infection nor to guide or monitor treatment. Performed at [facility], [place] [address], [place], [place] [address]  Radiology Studies: IR Veno/Ext/Uni Right  Result Date: 09/25/2022 INDICATION: 67 year old woman with severe right lower extremity pain and swelling. CT of the abdomen and pelvis shows severe narrowing of both common iliac arteries. No right lower extremity DVT identified on Doppler examination. IR consulted for bilateral lower extremity venogram with possible venoplasty/stent placement EXAM: 1. Ultrasound guided access of right internal jugular vein (x2) 2. Ultrasound-guided access of left common femoral vein 3. Bilateral lower extremity venogram 4. Inferior vena cavogram 5. Bilateral lower extremity angioplasty and stent placement 6. IVUS evaluation of bilateral external and common iliac veins and IVC COMPARISON:  None Available. MEDICATIONS: None. ANESTHESIA/SEDATION: MAC sedation performed and  monitored by the anesthesia team. FLUOROSCOPY: Radiation Exposure Index (as provided by the fluoroscopic device): 123456 mGy Kerma COMPLICATIONS: None immediate. FIRST ASSISTANT: Dr. Ruthann Cancer TECHNIQUE: Informed written consent was obtained from the patient after a thorough discussion of the procedural risks, benefits and alternatives. All questions were addressed. Maximal Sterile Barrier Technique was utilized including caps, mask, sterile gowns, sterile gloves, sterile drape, hand hygiene and skin antiseptic. A timeout was performed prior to the initiation of the procedure. Right neck and left groin skin prepped and draped in a sterile fashion. Ultrasound image documenting patency of the right internal jugular vein was obtained and placed in permanent medical record. Sterile ultrasound probe cover and gel utilized throughout the procedure. Utilizing continuous ultrasound guidance, the right internal jugular vein was accessed with a 21 gauge needle. 21 gauge needle exchanged for a transitional dilator set over 0.018 inch guidewire. Transitional dilator set exchanged for 9 French sheath over 0.035 inch guidewire. Kumpe catheter advanced to the right external iliac vein. 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 The severe focal stenosis was confirmed with intravascular ultrasound. Intravascular ultrasound was also utilized to measure the diameter of normal vein proximal and distal to the site of stenosis. Ultrasound image documenting patency of the left common femoral vein was obtained and placed in permanent medical record. Sterile ultrasound probe cover and gel utilized throughout the procedure. Utilizing continuous ultrasound guidance, the left common femoral vein was accessed at the level of the femoral head with a 21 gauge needle. 21 gauge needle exchanged for a transitional dilator set over 0.018 inch guidewire.  Transitional dilator set exchanged for 9 French sheath over 0.035 inch guidewire. Kumpe catheter advanced to the left external iliac vein. 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 Intravascular ultrasound evaluation of this region confirmed severe focal stenosis in the superior segment of the left common iliac vein. Intravascular ultrasound was utilized to determine normal diameter of inferior vena cava and common iliac vein distal and proximal to the stenosed segment. Ultrasound image documenting patency of the right internal jugular vein was obtained and placed in permanent medical record. Sterile ultrasound probe cover and gel utilized throughout the procedure. Utilizing continuous ultrasound guidance, the right internal jugular vein was accessed a second time with a 21 gauge needle. 21 gauge needle exchanged for a transitional dilator set over 0.018 inch guidewire. Transitional dilator set exchanged for 9 French sheath over 0.035 inch guidewire. Kumpe catheter and guidewire were advanced across the left common iliac vein. Contrast administered through the Kumpe the catheter at the level of the left external iliac vein confirmed appropriate intraluminal positioning. The stenosed  segments of the common iliac veins bilaterally were dilated with 10 mm balloon. Bilateral 14 mm x 150 mm Abre stents were simultaneously deployed across the stenosed common iliac veins. The stents were gently simultaneously post dilated with 14 mm balloons. Bilateral lower extremity venogram was performed after post dilation of the stents, which showed excellent unobstructed flow to the inferior vena cava. No significant narrowing of the IVC was identified. Both stented segments were again evaluated with intravascular ultrasound. There was mild narrowing of the right and moderate narrowing of the left stent  within the inferior vena cava, however given brisk flow through the uncovered stents, no additional venoplasty was performed. Right IJ and left CFV accesses were removed and hemostasis achieved with manual compression. 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 Electronically Signed   By: Miachel Roux M.D.   On: 09/25/2022 10:49   IR US Guide Vasc Access Right  Result Date: 09/25/2022 INDICATION: 67 year old woman with severe right lower extremity pain and swelling. CT of the abdomen and pelvis shows severe narrowing of both common iliac arteries. No right lower extremity DVT identified on Doppler examination. IR consulted for bilateral lower extremity venogram with possible venoplasty/stent placement EXAM: 1. Ultrasound guided access of right internal jugular vein (x2) 2. Ultrasound-guided access of left common femoral vein 3. Bilateral lower extremity venogram 4. Inferior vena cavogram 5. Bilateral lower extremity angioplasty and stent placement 6. IVUS evaluation of bilateral external and common iliac veins and IVC COMPARISON:  None Available. MEDICATIONS: None. ANESTHESIA/SEDATION: MAC sedation performed and monitored by the anesthesia team. FLUOROSCOPY: Radiation Exposure Index (as provided by the fluoroscopic device): 123456 mGy Kerma COMPLICATIONS: None immediate. FIRST ASSISTANT: Dr. Ruthann Cancer TECHNIQUE: Informed written consent was obtained from the patient after a thorough discussion of the procedural risks, benefits and alternatives. All questions were addressed. Maximal Sterile Barrier Technique was utilized including caps, mask, sterile gowns, sterile gloves, sterile drape, hand hygiene and skin antiseptic. A timeout was performed prior to the initiation of the procedure. Right neck and left groin skin prepped and draped in a sterile fashion.  Ultrasound image documenting patency of the right internal jugular vein was obtained and placed in permanent medical record. Sterile ultrasound probe cover and gel utilized throughout the procedure. Utilizing continuous ultrasound guidance, the right internal jugular vein was accessed with a 21 gauge needle. 21 gauge needle exchanged for a transitional dilator set over 0.018 inch guidewire. Transitional dilator set exchanged for 9 French sheath over 0.035 inch guidewire. Kumpe catheter advanced to the right external iliac vein. 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 The severe focal stenosis was confirmed with intravascular ultrasound. Intravascular ultrasound was also utilized to measure the diameter of normal vein proximal and distal to the site of stenosis. Ultrasound image documenting patency of the left common femoral vein was obtained and placed in permanent medical record. Sterile ultrasound probe cover and gel utilized throughout the procedure. Utilizing continuous ultrasound guidance, the left common femoral vein was accessed at the level of the femoral head with a 21 gauge needle. 21 gauge needle exchanged for a transitional dilator set over 0.018 inch guidewire. Transitional dilator set exchanged for 9 French sheath over 0.035 inch guidewire. Kumpe catheter advanced to the left external iliac vein. Left lower extremity venogram confirmed complete or  near complete occlusion of the left common iliac vein at the level of the confluence with the right common iliac vein. Intravascular ultrasound evaluation of this region confirmed severe focal stenosis in the superior segment of the left common iliac vein. Intravascular ultrasound was utilized to determine normal diameter of inferior vena cava and common iliac vein distal and proximal to the stenosed segment. Ultrasound image documenting patency of the right  internal jugular vein was obtained and placed in permanent medical record. Sterile ultrasound probe cover and gel utilized throughout the procedure. Utilizing continuous ultrasound guidance, the right internal jugular vein was accessed a second time with a 21 gauge needle. 21 gauge needle exchanged for a transitional dilator set over 0.018 inch guidewire. Transitional dilator set exchanged for 9 French sheath over 0.035 inch guidewire. Kumpe catheter and guidewire were advanced across the left common iliac vein. Contrast administered through the Kumpe the catheter at the level of the left external iliac vein confirmed appropriate intraluminal positioning. The stenosed segments of the common iliac veins bilaterally were dilated with 10 mm balloon. Bilateral 14 mm x 150 mm Abre stents were simultaneously deployed across the stenosed common iliac veins. The stents were gently simultaneously post dilated with 14 mm balloons. Bilateral lower extremity venogram was performed after post dilation of the stents, which showed excellent unobstructed flow to the inferior vena cava. No significant narrowing of the IVC was identified. Both stented segments were again evaluated with intravascular ultrasound. There was mild narrowing of the right and moderate narrowing of the left stent within the inferior vena cava, however given brisk flow through the uncovered stents, no additional venoplasty was performed. Right IJ and left CFV accesses were removed and hemostasis achieved with manual compression. 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 Electronically Signed   By: Miachel Roux M.D.   On: 09/25/2022 10:49   IR IVUS EACH ADDITIONAL NON CORONARY VESSEL  Result Date: 09/25/2022 INDICATION: 67 year old woman with severe right lower extremity pain and swelling. CT of the  abdomen and pelvis shows severe narrowing of both common iliac arteries. No right lower extremity DVT identified on Doppler examination. IR consulted for bilateral lower extremity venogram with possible venoplasty/stent placement EXAM: 1. Ultrasound guided access of right internal jugular vein (x2) 2. Ultrasound-guided access of left common femoral vein 3. Bilateral lower extremity venogram 4. Inferior vena cavogram 5. Bilateral lower extremity angioplasty and stent placement 6. IVUS evaluation of bilateral external and common iliac veins and IVC COMPARISON:  None Available. MEDICATIONS: None. ANESTHESIA/SEDATION: MAC sedation performed and monitored by the anesthesia team. FLUOROSCOPY: Radiation Exposure Index (as provided by the fluoroscopic device): 123456 mGy Kerma COMPLICATIONS: None immediate. FIRST ASSISTANT: Dr. Ruthann Cancer TECHNIQUE: Informed written consent was obtained from the patient after a thorough discussion of the procedural risks, benefits and alternatives. All questions were addressed. Maximal Sterile Barrier Technique was utilized including caps, mask, sterile gowns, sterile gloves, sterile drape, hand hygiene and skin antiseptic. A timeout was performed prior to the initiation of the procedure. Right neck and left groin skin prepped and draped in a sterile fashion. Ultrasound image documenting patency of the right internal jugular vein was obtained and placed in permanent medical record. Sterile ultrasound probe cover and gel utilized throughout the procedure. Utilizing continuous ultrasound guidance, the right internal jugular vein was accessed with a 21 gauge needle. 21 gauge needle exchanged for a  transitional dilator set over 0.018 inch guidewire. Transitional dilator set exchanged for 9 French sheath over 0.035 inch guidewire. Kumpe catheter advanced to the right external iliac vein. Right lower extremity venogram confirmed severe stenosis of the right common iliac vein near the confluence  with the left common iliac vein. The severe focal stenosis was confirmed with intravascular ultrasound. Intravascular ultrasound was also utilized to measure the diameter of normal vein proximal and distal to the site of stenosis. Ultrasound image documenting patency of the left common femoral vein was obtained and placed in permanent medical record. Sterile ultrasound probe cover and gel utilized throughout the procedure. Utilizing continuous ultrasound guidance, the left common femoral vein was accessed at the level of the femoral head with a 21 gauge needle. 21 gauge needle exchanged for a transitional dilator set over 0.018 inch guidewire. Transitional dilator set exchanged for 9 French sheath over 0.035 inch guidewire. Kumpe catheter advanced to the left external iliac vein. 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 Intravascular ultrasound evaluation of this region confirmed severe focal stenosis in the superior segment of the left common iliac vein. Intravascular ultrasound was utilized to determine normal diameter of inferior vena cava and common iliac vein distal and proximal to the stenosed segment. Ultrasound image documenting patency of the right internal jugular vein was obtained and placed in permanent medical record. Sterile ultrasound probe cover and gel utilized throughout the procedure. Utilizing continuous ultrasound guidance, the right internal jugular vein was accessed a second time with a 21 gauge needle. 21 gauge needle exchanged for a transitional dilator set over 0.018 inch guidewire. Transitional dilator set exchanged for 9 French sheath over 0.035 inch guidewire. Kumpe catheter and guidewire were advanced across the left common iliac vein. Contrast administered through the Kumpe the catheter at the level of the left external iliac vein confirmed appropriate  intraluminal positioning. The stenosed segments of the common iliac veins bilaterally were dilated with 10 mm balloon. Bilateral 14 mm x 150 mm Abre stents were simultaneously deployed across the stenosed common iliac veins. The stents were gently simultaneously post dilated with 14 mm balloons. Bilateral lower extremity venogram was performed after post dilation of the stents, which showed excellent unobstructed flow to the inferior vena cava. No significant narrowing of the IVC was identified. Both stented segments were again evaluated with intravascular ultrasound. There was mild narrowing of the right and moderate narrowing of the left stent within the inferior vena cava, however given brisk flow through the uncovered stents, no additional venoplasty was performed. Right IJ and left CFV accesses were removed and hemostasis achieved with manual compression. 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 Electronically Signed   By: Miachel Roux M.D.   On: 09/25/2022 10:49   IR IVUS EACH ADDITIONAL NON CORONARY VESSEL  Result Date: 09/25/2022 INDICATION: 67 year old woman with severe right lower extremity pain and swelling. CT of the abdomen and pelvis shows severe narrowing of both common iliac arteries. No right lower extremity DVT identified on Doppler examination. IR consulted for bilateral lower extremity venogram with possible venoplasty/stent placement EXAM: 1. Ultrasound guided access of right internal jugular vein (x2) 2. Ultrasound-guided access of left common femoral vein 3. Bilateral lower extremity venogram 4. Inferior vena cavogram 5. Bilateral lower extremity  angioplasty and stent placement 6. IVUS evaluation of bilateral external and common iliac veins and IVC COMPARISON:  None Available. MEDICATIONS: None. ANESTHESIA/SEDATION: MAC sedation  performed and monitored by the anesthesia team. FLUOROSCOPY: Radiation Exposure Index (as provided by the fluoroscopic device): 123456 mGy Kerma COMPLICATIONS: None immediate. FIRST ASSISTANT: Dr. Ruthann Cancer TECHNIQUE: Informed written consent was obtained from the patient after a thorough discussion of the procedural risks, benefits and alternatives. All questions were addressed. Maximal Sterile Barrier Technique was utilized including caps, mask, sterile gowns, sterile gloves, sterile drape, hand hygiene and skin antiseptic. A timeout was performed prior to the initiation of the procedure. Right neck and left groin skin prepped and draped in a sterile fashion. Ultrasound image documenting patency of the right internal jugular vein was obtained and placed in permanent medical record. Sterile ultrasound probe cover and gel utilized throughout the procedure. Utilizing continuous ultrasound guidance, the right internal jugular vein was accessed with a 21 gauge needle. 21 gauge needle exchanged for a transitional dilator set over 0.018 inch guidewire. Transitional dilator set exchanged for 9 French sheath over 0.035 inch guidewire. Kumpe catheter advanced to the right external iliac vein. 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 The severe focal stenosis was confirmed with intravascular ultrasound. Intravascular ultrasound was also utilized to measure the diameter of normal vein proximal and distal to the site of stenosis. Ultrasound image documenting patency of the left common femoral vein was obtained and placed in permanent medical record. Sterile ultrasound probe cover and gel utilized throughout the procedure. Utilizing continuous ultrasound guidance, the left common femoral vein was accessed at the level of the femoral head with a 21 gauge needle. 21 gauge needle exchanged for a transitional dilator set over 0.018  inch guidewire. Transitional dilator set exchanged for 9 French sheath over 0.035 inch guidewire. Kumpe catheter advanced to the left external iliac vein. 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 Intravascular ultrasound evaluation of this region confirmed severe focal stenosis in the superior segment of the left common iliac vein. Intravascular ultrasound was utilized to determine normal diameter of inferior vena cava and common iliac vein distal and proximal to the stenosed segment. Ultrasound image documenting patency of the right internal jugular vein was obtained and placed in permanent medical record. Sterile ultrasound probe cover and gel utilized throughout the procedure. Utilizing continuous ultrasound guidance, the right internal jugular vein was accessed a second time with a 21 gauge needle. 21 gauge needle exchanged for a transitional dilator set over 0.018 inch guidewire. Transitional dilator set exchanged for 9 French sheath over 0.035 inch guidewire. Kumpe catheter and guidewire were advanced across the left common iliac vein. Contrast administered through the Kumpe the catheter at the level of the left external iliac vein confirmed appropriate intraluminal positioning. The stenosed segments of the common iliac veins bilaterally were dilated with 10 mm balloon. Bilateral 14 mm x 150 mm Abre stents were simultaneously deployed across the stenosed common iliac veins. The stents were gently simultaneously post dilated with 14 mm balloons. Bilateral lower extremity venogram was performed after post dilation of the stents, which showed excellent unobstructed flow to the inferior vena cava. No significant narrowing of the IVC was identified. Both stented segments were again evaluated with intravascular ultrasound. There was mild narrowing of the  right and moderate narrowing of  the left stent within the inferior vena cava, however given brisk flow through the uncovered stents, no additional venoplasty was performed. Right IJ and left CFV accesses were removed and hemostasis achieved with manual compression. 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 Electronically Signed   By: Miachel Roux M.D.   On: 09/25/2022 10:49   IR Veno/Ext/Uni Left  Result Date: 09/25/2022 INDICATION: 67 year old woman with severe right lower extremity pain and swelling. CT of the abdomen and pelvis shows severe narrowing of both common iliac arteries. No right lower extremity DVT identified on Doppler examination. IR consulted for bilateral lower extremity venogram with possible venoplasty/stent placement EXAM: 1. Ultrasound guided access of right internal jugular vein (x2) 2. Ultrasound-guided access of left common femoral vein 3. Bilateral lower extremity venogram 4. Inferior vena cavogram 5. Bilateral lower extremity angioplasty and stent placement 6. IVUS evaluation of bilateral external and common iliac veins and IVC COMPARISON:  None Available. MEDICATIONS: None. ANESTHESIA/SEDATION: MAC sedation performed and monitored by the anesthesia team. FLUOROSCOPY: Radiation Exposure Index (as provided by the fluoroscopic device): 123456 mGy Kerma COMPLICATIONS: None immediate. FIRST ASSISTANT: Dr. Ruthann Cancer TECHNIQUE: Informed written consent was obtained from the patient after a thorough discussion of the procedural risks, benefits and alternatives. All questions were addressed. Maximal Sterile Barrier Technique was utilized including caps, mask, sterile gowns, sterile gloves, sterile drape, hand hygiene and skin antiseptic. A timeout was performed prior to the initiation of the procedure. Right neck and left groin skin prepped and draped in a sterile  fashion. Ultrasound image documenting patency of the right internal jugular vein was obtained and placed in permanent medical record. Sterile ultrasound probe cover and gel utilized throughout the procedure. Utilizing continuous ultrasound guidance, the right internal jugular vein was accessed with a 21 gauge needle. 21 gauge needle exchanged for a transitional dilator set over 0.018 inch guidewire. Transitional dilator set exchanged for 9 French sheath over 0.035 inch guidewire. Kumpe catheter advanced to the right external iliac vein. 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 The severe focal stenosis was confirmed with intravascular ultrasound. Intravascular ultrasound was also utilized to measure the diameter of normal vein proximal and distal to the site of stenosis. Ultrasound image documenting patency of the left common femoral vein was obtained and placed in permanent medical record. Sterile ultrasound probe cover and gel utilized throughout the procedure. Utilizing continuous ultrasound guidance, the left common femoral vein was accessed at the level of the femoral head with a 21 gauge needle. 21 gauge needle exchanged for a transitional dilator set over 0.018 inch guidewire. Transitional dilator set exchanged for 9 French sheath over 0.035 inch guidewire. Kumpe catheter advanced to the left external iliac vein. 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 Intravascular ultrasound evaluation of this region confirmed severe focal stenosis in the superior segment of the left common iliac vein. Intravascular ultrasound was utilized to determine normal diameter of inferior vena cava and common iliac vein distal and proximal to the stenosed segment. Ultrasound image documenting patency of  the right internal jugular vein was obtained and placed in permanent medical record. Sterile ultrasound probe cover and gel  utilized throughout the procedure. Utilizing continuous ultrasound guidance, the right internal jugular vein was accessed a second time with a 21 gauge needle. 21 gauge needle exchanged for a transitional dilator set over 0.018 inch guidewire. Transitional dilator set exchanged for 9 French sheath over 0.035 inch guidewire. Kumpe catheter and guidewire were advanced across the left common iliac vein. Contrast administered through the Kumpe the catheter at the level of the left external iliac vein confirmed appropriate intraluminal positioning. The stenosed segments of the common iliac veins bilaterally were dilated with 10 mm balloon. Bilateral 14 mm x 150 mm Abre stents were simultaneously deployed across the stenosed common iliac veins. The stents were gently simultaneously post dilated with 14 mm balloons. Bilateral lower extremity venogram was performed after post dilation of the stents, which showed excellent unobstructed flow to the inferior vena cava. No significant narrowing of the IVC was identified. Both stented segments were again evaluated with intravascular ultrasound. There was mild narrowing of the right and moderate narrowing of the left stent within the inferior vena cava, however given brisk flow through the uncovered stents, no additional venoplasty was performed. Right IJ and left CFV accesses were removed and hemostasis achieved with manual compression. 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 Electronically Signed   By: Miachel Roux M.D.   On: 09/25/2022 10:49   IR TRANSCATH PLC STENT  INITIAL VEIN  INC ANGIOPLASTY  Result Date: 09/25/2022 INDICATION: 67 year old woman with severe right lower extremity pain and  swelling. CT of the abdomen and pelvis shows severe narrowing of both common iliac arteries. No right lower extremity DVT identified on Doppler examination. IR consulted for bilateral lower extremity venogram with possible venoplasty/stent placement EXAM: 1. Ultrasound guided access of right internal jugular vein (x2) 2. Ultrasound-guided access of left common femoral vein 3. Bilateral lower extremity venogram 4. Inferior vena cavogram 5. Bilateral lower extremity angioplasty and stent placement 6. IVUS evaluation of bilateral external and common iliac veins and IVC COMPARISON:  None Available. MEDICATIONS: None. ANESTHESIA/SEDATION: MAC sedation performed and monitored by the anesthesia team. FLUOROSCOPY: Radiation Exposure Index (as provided by the fluoroscopic device): 123456 mGy Kerma COMPLICATIONS: None immediate. FIRST ASSISTANT: Dr. Ruthann Cancer TECHNIQUE: Informed written consent was obtained from the patient after a thorough discussion of the procedural risks, benefits and alternatives. All questions were addressed. Maximal Sterile Barrier Technique was utilized including caps, mask, sterile gowns, sterile gloves, sterile drape, hand hygiene and skin antiseptic. A timeout was performed prior to the initiation of the procedure. Right neck and left groin skin prepped and draped in a sterile fashion. Ultrasound image documenting patency of the right internal jugular vein was obtained and placed in permanent medical record. Sterile ultrasound probe cover and gel utilized throughout the procedure. Utilizing continuous ultrasound guidance, the right internal jugular vein was accessed with a 21 gauge needle. 21 gauge needle exchanged for a transitional dilator set over 0.018 inch guidewire. Transitional dilator set exchanged for 9 French sheath over 0.035 inch guidewire. Kumpe catheter advanced to the right external iliac vein. Right lower extremity venogram confirmed severe stenosis of the right common iliac vein  near the confluence with the left common iliac vein. The severe focal stenosis was confirmed with intravascular ultrasound. Intravascular ultrasound was also utilized to measure the diameter of normal vein proximal and distal to the site of stenosis. Ultrasound image documenting patency of the  left common femoral vein was obtained and placed in permanent medical record. Sterile ultrasound probe cover and gel utilized throughout the procedure. Utilizing continuous ultrasound guidance, the left common femoral vein was accessed at the level of the femoral head with a 21 gauge needle. 21 gauge needle exchanged for a transitional dilator set over 0.018 inch guidewire. Transitional dilator set exchanged for 9 French sheath over 0.035 inch guidewire. Kumpe catheter advanced to the left external iliac vein. 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 Intravascular ultrasound evaluation of this region confirmed severe focal stenosis in the superior segment of the left common iliac vein. Intravascular ultrasound was utilized to determine normal diameter of inferior vena cava and common iliac vein distal and proximal to the stenosed segment. Ultrasound image documenting patency of the right internal jugular vein was obtained and placed in permanent medical record. Sterile ultrasound probe cover and gel utilized throughout the procedure. Utilizing continuous ultrasound guidance, the right internal jugular vein was accessed a second time with a 21 gauge needle. 21 gauge needle exchanged for a transitional dilator set over 0.018 inch guidewire. Transitional dilator set exchanged for 9 French sheath over 0.035 inch guidewire. Kumpe catheter and guidewire were advanced across the left common iliac vein. Contrast administered through the Kumpe the catheter at the level of the left external iliac vein  confirmed appropriate intraluminal positioning. The stenosed segments of the common iliac veins bilaterally were dilated with 10 mm balloon. Bilateral 14 mm x 150 mm Abre stents were simultaneously deployed across the stenosed common iliac veins. The stents were gently simultaneously post dilated with 14 mm balloons. Bilateral lower extremity venogram was performed after post dilation of the stents, which showed excellent unobstructed flow to the inferior vena cava. No significant narrowing of the IVC was identified. Both stented segments were again evaluated with intravascular ultrasound. There was mild narrowing of the right and moderate narrowing of the left stent within the inferior vena cava, however given brisk flow through the uncovered stents, no additional venoplasty was performed. Right IJ and left CFV accesses were removed and hemostasis achieved with manual compression. 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 Electronically Signed   By: Miachel Roux M.D.   On: 09/25/2022 10:49   IR Veno/Ext/Bi  Result Date: 09/23/2022 INDICATION: 68 year old female presents for venogram, intravascular ultrasound, possible intervention for suspected right iliac vein occlusion and symptomatic swelling of the right lower extremity. EXAM: LOWER EXTREMITY VENOGRAM DISCONTINUATION OF LOWER EXTREMITY VENOGRAM COMPARISON:  CT 09/18/2022 MEDICATIONS: NONE ANESTHESIA/SEDATION: Moderate (conscious) sedation was employed during this procedure. A total of Versed 1.0 mg and DILAUDID 1 MG, 50 mcg fentanyl was administered intravenously by the radiology nurse. Total intra-service moderate Sedation Time: 15 minutes. The patient's level of consciousness and vital signs were monitored continuously by radiology nursing throughout the procedure under my direct supervision.  FLUOROSCOPY: None COMPLICATIONS: None TECHNIQUE: Informed written consent was obtained from the patient after a thorough discussion of the procedural risks, benefits and alternatives. All questions were addressed. Maximal Sterile Barrier Technique was utilized including caps, mask, sterile gowns, sterile gloves, sterile drape, hand hygiene and skin antiseptic. A timeout was performed prior to the initiation of the procedure. Initiation of conscious sedation was performed in order to attempt to have  the patient become comfortable under the image intensifier. After medications it was clear that the patient would not be comfortable nor safe on the table. Procedure was aborted. FINDINGS: None IMPRESSION: Attempt at lower extremity venogram and possible treatment was aborted, as the patient could not be made comfortable with moderate sedation. Signed, Dulcy Fanny. Nadene Rubins, RPVI [facility] Electronically Signed   By: Corrie Mckusick D.O.   On: 09/23/2022 11:55        Scheduled Meds:  aspirin  81 mg Oral Daily   atorvastatin  40 mg Oral Daily   brimonidine  1 drop Both Eyes BID   Chlorhexidine Gluconate Cloth  6 each Topical Q0600   cyanocobalamin  1,000 mcg Oral Daily   enoxaparin (LOVENOX) injection  40 mg Subcutaneous Q24H   gabapentin  300 mg Oral BID   gabapentin  400 mg Oral QHS   mupirocin ointment  1 Application Nasal BID   oxyCODONE  15 mg Oral Q12H   pantoprazole  40 mg Oral Daily   polyethylene glycol  17 g Oral Daily   senna-docusate  1 tablet Oral BID   spironolactone  25 mg Oral Daily   Continuous Infusions:  methocarbamol (ROBAXIN) IV 1,000 mg (09/24/22 1426)     LOS: 7 days    Time spent: 35 minutes    Barb Merino, MD [facility] Pager [phone]

## 2022-09-25 NOTE — Transfer of Care (Signed)
Immediate Anesthesia Transfer of Care Note  Patient: Kristin Ward  Procedure(s) Performed: ARTERIOGRAM RIGHT LEG (Right)  Patient Location: PACU  Anesthesia Type:General  Level of Consciousness: drowsy and patient cooperative  Airway & Oxygen Therapy: Patient Spontanous Breathing and Patient connected to face mask oxygen  Post-op Assessment: Report given to RN, Post -op Vital signs reviewed and stable, and Patient moving all extremities  Post vital signs: Reviewed and stable  Last Vitals:  Vitals Value Taken Time  BP    Temp    Pulse    Resp    SpO2      Last Pain:  Vitals:   09/25/22 0709  TempSrc:   PainSc: 4       Patients Stated Pain Goal: 0 ([id number])  Complications: No notable events documented.

## 2022-09-25 NOTE — Anesthesia Postprocedure Evaluation (Signed)
Anesthesia Post Note  Patient: Kristin Ward  Procedure(s) Performed: ARTERIOGRAM RIGHT LEG (Right)     Patient location during evaluation: PACU Anesthesia Type: MAC Level of consciousness: awake and alert Pain management: pain level controlled Vital Signs Assessment: post-procedure vital signs reviewed and stable Respiratory status: spontaneous breathing, nonlabored ventilation and respiratory function stable Cardiovascular status: stable and blood pressure returned to baseline Anesthetic complications: no   No notable events documented.  Last Vitals:  Vitals:   09/24/22 1957 09/25/22 0403  BP: (!) 145/90 113/76  Pulse: (!) 104 (!) 123  Resp: (!) 22 (!) 22  Temp: 36.6 C (!) 36.4 C  SpO2:      Last Pain:  Vitals:   09/25/22 0709  TempSrc:   PainSc: [place]

## 2022-09-25 NOTE — Progress Notes (Addendum)
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 Her pain is overall better, able to ambulate independently, but do have intermittent spasm and pain.  Right lower extremity edema has not changed significantly, no other new complaints.  Objective:  Vitals:   09/25/22 1048 09/25/22 2033  BP: 104/74 107/75  Pulse: (!) 106 98  Resp: 18 18  Temp: 97.6 F (36.4 C) (!) 97.5 F (36.4 C)  SpO2: 95%     Body mass index is 34.93 kg/m.  Intake/Output Summary (Last 24 hours) at 09/25/2022 2102 Last data filed at 09/25/2022 2034 Gross per 24 hour  Intake 480 ml  Output 400 ml  Net 80 ml     Sclerae unicteric  Oropharynx clear  No peripheral adenopathy  Lungs clear -- no rales or rhonchi  Heart regular rate and rhythm  Abdomen benign  MSK no focal spinal tenderness, (+) significant right lower extremity edema with soft tissue firmness up to right groin  Neuro nonfocal    CBG (last 3)  Recent Labs    09/24/22 1800  GLUCAP 109*     Labs:  Urine Studies No results for input(s): "UHGB", "CRYS" in the last 72 hours.  Invalid input(s): "UACOL", "UAPR", "USPG", "UPH", "UTP", "UGL", "UKET", "UBIL", "UNIT", "UROB", "ULEU", "UEPI", "UWBC", "URBC", "UBAC", "CAST", "UCOM", "BILUA"  Basic Metabolic Panel: Recent Labs  Lab 09/19/22 0528 09/20/22 0639 09/23/22 0237 09/24/22 0609 09/25/22 0653  NA 134* 135 133* 132* 132*  K 4.1 4.2 4.3 4.5 4.5  CL 108 107 105 105 106  CO2 21* 22 20* 21* 20*  GLUCOSE 85 82 96 99 96  BUN 16 14 15 15 13  $ CREATININE 1.29* 1.17* 1.29* 1.36* 1.27*  CALCIUM 8.2* 8.3* 8.4* 8.4* 8.4*   GFR Estimated Creatinine Clearance: 54.7 mL/min (A) (by C-G formula based on SCr of 1.27 mg/dL (H)). Liver Function Tests: Recent Labs  Lab 09/19/22 0528  AST 27  ALT 17  ALKPHOS 76  BILITOT  0.6  PROT 6.6  ALBUMIN 2.9*   No results for input(s): "LIPASE", "AMYLASE" in the last 168 hours. No results for input(s): "AMMONIA" in the last 168 hours. Coagulation profile Recent Labs  Lab 09/23/22 0237  INR 1.2    CBC: Recent Labs  Lab 09/19/22 0528 09/20/22 0639 09/23/22 0237 09/24/22 0609 09/25/22 0653  WBC 3.3* 3.6* 5.0 5.3 6.5  NEUTROABS  --   --  3.4  --   --   HGB 9.6* 9.4* 10.6* 10.8* 11.3*  HCT 29.4* 29.0* 31.4* 32.5* 33.4*  MCV 93.3 93.2 91.0 90.8 90.3  PLT 138* 169 342 459* 543*   Cardiac Enzymes: No results for input(s): "CKTOTAL", "CKMB", "CKMBINDEX", "TROPONINI" in the last 168 hours. BNP: Invalid input(s): "POCBNP" CBG: Recent Labs  Lab 09/19/22 0731 09/24/22 1800  GLUCAP 90 109*   D-Dimer No results for input(s): "DDIMER" in the last 72 hours. Hgb A1c No results for input(s): "HGBA1C" in the last 72 hours.  Lipid Profile No results for input(s): "CHOL", "HDL", "LDLCALC", "TRIG", "CHOLHDL", "LDLDIRECT" in the last 72 hours. Thyroid function studies No results for input(s): "TSH", "T4TOTAL", "T3FREE", "THYROIDAB" in the last 72 hours.  Invalid input(s): "FREET3" Anemia work up No results for input(s): "VITAMINB12", "FOLATE", "FERRITIN", "TIBC", "IRON", "RETICCTPCT" in the last 72 hours. Microbiology Recent Results (from the past 240 hour(s))  Surgical pcr  screen     Status: Abnormal   Collection Time: 09/24/22  4:37 PM   Specimen: Nasal Mucosa; Nasal Swab  Result Value Ref Range Status   MRSA, PCR NEGATIVE NEGATIVE Final   Staphylococcus aureus POSITIVE (A) NEGATIVE Final    Comment: (NOTE) The Xpert SA Assay (FDA approved for NASAL specimens in patients 65 years of age and older), is one component of a comprehensive surveillance program. It is not intended to diagnose infection nor to guide or monitor treatment. Performed at [facility], [place] [address] [place], [place] [address]       Studies:  IR Veno/Ext/Uni  Right  Result Date: 09/25/2022 INDICATION: 67 year old woman with severe right lower extremity pain and swelling. CT of the abdomen and pelvis shows severe narrowing of both common iliac arteries. No right lower extremity DVT identified on Doppler examination. IR consulted for bilateral lower extremity venogram with possible venoplasty/stent placement EXAM: 1. Ultrasound guided access of right internal jugular vein (x2) 2. Ultrasound-guided access of left common femoral vein 3. Bilateral lower extremity venogram 4. Inferior vena cavogram 5. Bilateral lower extremity angioplasty and stent placement 6. IVUS evaluation of bilateral external and common iliac veins and IVC COMPARISON:  None Available. MEDICATIONS: None. ANESTHESIA/SEDATION: MAC sedation performed and monitored by the anesthesia team. FLUOROSCOPY: Radiation Exposure Index (as provided by the fluoroscopic device): 123456 mGy Kerma COMPLICATIONS: None immediate. FIRST ASSISTANT: Dr. Ruthann Cancer TECHNIQUE: Informed written consent was obtained from the patient after a thorough discussion of the procedural risks, benefits and alternatives. All questions were addressed. Maximal Sterile Barrier Technique was utilized including caps, mask, sterile gowns, sterile gloves, sterile drape, hand hygiene and skin antiseptic. A timeout was performed prior to the initiation of the procedure. Right neck and left groin skin prepped and draped in a sterile fashion. Ultrasound image documenting patency of the right internal jugular vein was obtained and placed in permanent medical record. Sterile ultrasound probe cover and gel utilized throughout the procedure. Utilizing continuous ultrasound guidance, the right internal jugular vein was accessed with a 21 gauge needle. 21 gauge needle exchanged for a transitional dilator set over 0.018 inch guidewire. Transitional dilator set exchanged for 9 French sheath over 0.035 inch guidewire. Kumpe catheter advanced to the right  external iliac vein. 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 The severe focal stenosis was confirmed with intravascular ultrasound. Intravascular ultrasound was also utilized to measure the diameter of normal vein proximal and distal to the site of stenosis. Ultrasound image documenting patency of the left common femoral vein was obtained and placed in permanent medical record. Sterile ultrasound probe cover and gel utilized throughout the procedure. Utilizing continuous ultrasound guidance, the left common femoral vein was accessed at the level of the femoral head with a 21 gauge needle. 21 gauge needle exchanged for a transitional dilator set over 0.018 inch guidewire. Transitional dilator set exchanged for 9 French sheath over 0.035 inch guidewire. Kumpe catheter advanced to the left external iliac vein. 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 Intravascular ultrasound evaluation of this region confirmed severe focal stenosis in the superior segment of the left common iliac vein. Intravascular ultrasound was utilized to determine normal diameter of inferior vena cava and common iliac vein distal and proximal to the stenosed segment. Ultrasound  image documenting patency of the right internal jugular vein was obtained and placed in permanent medical record. Sterile ultrasound probe cover and gel utilized throughout the procedure. Utilizing continuous ultrasound guidance, the right internal jugular vein was accessed a second time with a 21 gauge needle. 21 gauge needle exchanged for a transitional dilator set over 0.018 inch guidewire. Transitional dilator set exchanged for 9 French sheath over 0.035 inch guidewire. Kumpe catheter and guidewire were advanced across the left common iliac  vein. Contrast administered through the Kumpe the catheter at the level of the left external iliac vein confirmed appropriate intraluminal positioning. The stenosed segments of the common iliac veins bilaterally were dilated with 10 mm balloon. Bilateral 14 mm x 150 mm Abre stents were simultaneously deployed across the stenosed common iliac veins. The stents were gently simultaneously post dilated with 14 mm balloons. Bilateral lower extremity venogram was performed after post dilation of the stents, which showed excellent unobstructed flow to the inferior vena cava. No significant narrowing of the IVC was identified. Both stented segments were again evaluated with intravascular ultrasound. There was mild narrowing of the right and moderate narrowing of the left stent within the inferior vena cava, however given brisk flow through the uncovered stents, no additional venoplasty was performed. Right IJ and left CFV accesses were removed and hemostasis achieved with manual compression. 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 Electronically Signed   By: Miachel Roux M.D.   On: 09/25/2022 10:49   IR US Guide Vasc Access Right  Result Date: 09/25/2022 INDICATION: 67 year old woman with severe right lower extremity pain and swelling. CT of the abdomen and pelvis shows severe narrowing of both common iliac arteries. No right lower extremity DVT identified on Doppler examination. IR consulted for bilateral lower extremity venogram with possible venoplasty/stent placement EXAM: 1. Ultrasound guided access of right internal jugular vein (x2) 2. Ultrasound-guided access of left common femoral vein 3. Bilateral lower extremity venogram 4. Inferior vena cavogram 5. Bilateral lower extremity angioplasty and stent placement 6. IVUS evaluation of bilateral external and  common iliac veins and IVC COMPARISON:  None Available. MEDICATIONS: None. ANESTHESIA/SEDATION: MAC sedation performed and monitored by the anesthesia team. FLUOROSCOPY: Radiation Exposure Index (as provided by the fluoroscopic device): 123456 mGy Kerma COMPLICATIONS: None immediate. FIRST ASSISTANT: Dr. Ruthann Cancer TECHNIQUE: Informed written consent was obtained from the patient after a thorough discussion of the procedural risks, benefits and alternatives. All questions were addressed. Maximal Sterile Barrier Technique was utilized including caps, mask, sterile gowns, sterile gloves, sterile drape, hand hygiene and skin antiseptic. A timeout was performed prior to the initiation of the procedure. Right neck and left groin skin prepped and draped in a sterile fashion. Ultrasound image documenting patency of the right internal jugular vein was obtained and placed in permanent medical record. Sterile ultrasound probe cover and gel utilized throughout the procedure. Utilizing continuous ultrasound guidance, the right internal jugular vein was accessed with a 21 gauge needle. 21 gauge needle exchanged for a transitional dilator set over 0.018 inch guidewire. Transitional dilator set exchanged for 9 French sheath over 0.035 inch guidewire. Kumpe catheter advanced to the right external iliac vein. 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 The severe focal stenosis was confirmed with intravascular ultrasound. Intravascular ultrasound was also utilized to measure  the diameter of normal vein proximal and distal to the site of stenosis. Ultrasound image documenting patency of the left common femoral vein was obtained and placed in permanent medical record. Sterile ultrasound probe cover and gel utilized throughout the procedure. Utilizing continuous ultrasound guidance, the left common femoral vein was accessed at the level of  the femoral head with a 21 gauge needle. 21 gauge needle exchanged for a transitional dilator set over 0.018 inch guidewire. Transitional dilator set exchanged for 9 French sheath over 0.035 inch guidewire. Kumpe catheter advanced to the left external iliac vein. 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 Intravascular ultrasound evaluation of this region confirmed severe focal stenosis in the superior segment of the left common iliac vein. Intravascular ultrasound was utilized to determine normal diameter of inferior vena cava and common iliac vein distal and proximal to the stenosed segment. Ultrasound image documenting patency of the right internal jugular vein was obtained and placed in permanent medical record. Sterile ultrasound probe cover and gel utilized throughout the procedure. Utilizing continuous ultrasound guidance, the right internal jugular vein was accessed a second time with a 21 gauge needle. 21 gauge needle exchanged for a transitional dilator set over 0.018 inch guidewire. Transitional dilator set exchanged for 9 French sheath over 0.035 inch guidewire. Kumpe catheter and guidewire were advanced across the left common iliac vein. Contrast administered through the Kumpe the catheter at the level of the left external iliac vein confirmed appropriate intraluminal positioning. The stenosed segments of the common iliac veins bilaterally were dilated with 10 mm balloon. Bilateral 14 mm x 150 mm Abre stents were simultaneously deployed across the stenosed common iliac veins. The stents were gently simultaneously post dilated with 14 mm balloons. Bilateral lower extremity venogram was performed after post dilation of the stents, which showed excellent unobstructed flow to the inferior vena cava. No significant narrowing of the IVC was identified. Both stented segments were  again evaluated with intravascular ultrasound. There was mild narrowing of the right and moderate narrowing of the left stent within the inferior vena cava, however given brisk flow through the uncovered stents, no additional venoplasty was performed. Right IJ and left CFV accesses were removed and hemostasis achieved with manual compression. 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 Electronically Signed   By: Miachel Roux M.D.   On: 09/25/2022 10:49   IR IVUS EACH ADDITIONAL NON CORONARY VESSEL  Result Date: 09/25/2022 INDICATION: 67 year old woman with severe right lower extremity pain and swelling. CT of the abdomen and pelvis shows severe narrowing of both common iliac arteries. No right lower extremity DVT identified on Doppler examination. IR consulted for bilateral lower extremity venogram with possible venoplasty/stent placement EXAM: 1. Ultrasound guided access of right internal jugular vein (x2) 2. Ultrasound-guided access of left common femoral vein 3. Bilateral lower extremity venogram 4. Inferior vena cavogram 5. Bilateral lower extremity angioplasty and stent placement 6. IVUS evaluation of bilateral external and common iliac veins and IVC COMPARISON:  None Available. MEDICATIONS: None. ANESTHESIA/SEDATION: MAC sedation performed and monitored by the anesthesia team. FLUOROSCOPY: Radiation Exposure Index (as provided by the fluoroscopic device): 123456 mGy Kerma COMPLICATIONS: None immediate. FIRST ASSISTANT: Dr. Ruthann Cancer TECHNIQUE: Informed written consent was obtained from the patient after a thorough discussion  of the procedural risks, benefits and alternatives. All questions were addressed. Maximal Sterile Barrier Technique was utilized including caps, mask, sterile gowns, sterile gloves, sterile drape, hand hygiene and skin antiseptic. A  timeout was performed prior to the initiation of the procedure. Right neck and left groin skin prepped and draped in a sterile fashion. Ultrasound image documenting patency of the right internal jugular vein was obtained and placed in permanent medical record. Sterile ultrasound probe cover and gel utilized throughout the procedure. Utilizing continuous ultrasound guidance, the right internal jugular vein was accessed with a 21 gauge needle. 21 gauge needle exchanged for a transitional dilator set over 0.018 inch guidewire. Transitional dilator set exchanged for 9 French sheath over 0.035 inch guidewire. Kumpe catheter advanced to the right external iliac vein. 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 The severe focal stenosis was confirmed with intravascular ultrasound. Intravascular ultrasound was also utilized to measure the diameter of normal vein proximal and distal to the site of stenosis. Ultrasound image documenting patency of the left common femoral vein was obtained and placed in permanent medical record. Sterile ultrasound probe cover and gel utilized throughout the procedure. Utilizing continuous ultrasound guidance, the left common femoral vein was accessed at the level of the femoral head with a 21 gauge needle. 21 gauge needle exchanged for a transitional dilator set over 0.018 inch guidewire. Transitional dilator set exchanged for 9 French sheath over 0.035 inch guidewire. Kumpe catheter advanced to the left external iliac vein. 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 Intravascular ultrasound evaluation of this region confirmed severe focal stenosis in the superior segment of the left common iliac vein. Intravascular ultrasound was utilized to determine normal diameter of  inferior vena cava and common iliac vein distal and proximal to the stenosed segment. Ultrasound image documenting patency of the right internal jugular vein was obtained and placed in permanent medical record. Sterile ultrasound probe cover and gel utilized throughout the procedure. Utilizing continuous ultrasound guidance, the right internal jugular vein was accessed a second time with a 21 gauge needle. 21 gauge needle exchanged for a transitional dilator set over 0.018 inch guidewire. Transitional dilator set exchanged for 9 French sheath over 0.035 inch guidewire. Kumpe catheter and guidewire were advanced across the left common iliac vein. Contrast administered through the Kumpe the catheter at the level of the left external iliac vein confirmed appropriate intraluminal positioning. The stenosed segments of the common iliac veins bilaterally were dilated with 10 mm balloon. Bilateral 14 mm x 150 mm Abre stents were simultaneously deployed across the stenosed common iliac veins. The stents were gently simultaneously post dilated with 14 mm balloons. Bilateral lower extremity venogram was performed after post dilation of the stents, which showed excellent unobstructed flow to the inferior vena cava. No significant narrowing of the IVC was identified. Both stented segments were again evaluated with intravascular ultrasound. There was mild narrowing of the right and moderate narrowing of the left stent within the inferior vena cava, however given brisk flow through the uncovered stents, no additional venoplasty was performed. Right IJ and left CFV accesses were removed and hemostasis achieved with manual compression. 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 Electronically Signed   By: Miachel Roux M.D.   On: 09/25/2022 10:49   IR IVUS EACH ADDITIONAL NON  CORONARY VESSEL  Result Date: 09/25/2022 INDICATION: 67 year old woman with severe right lower extremity pain and swelling. CT of the abdomen and pelvis shows severe narrowing of both common iliac arteries. No right lower extremity DVT identified on Doppler examination. IR consulted for bilateral lower extremity venogram with possible venoplasty/stent placement EXAM: 1. Ultrasound guided access of right internal jugular vein (x2) 2. Ultrasound-guided access of left common femoral vein 3. Bilateral lower extremity venogram 4. Inferior vena cavogram 5. Bilateral lower extremity angioplasty and stent placement 6. IVUS evaluation of bilateral external and common iliac veins and IVC COMPARISON:  None Available. MEDICATIONS: None. ANESTHESIA/SEDATION: MAC sedation performed and monitored by the anesthesia team. FLUOROSCOPY: Radiation Exposure Index (as provided by the fluoroscopic device): 123456 mGy Kerma COMPLICATIONS: None immediate. FIRST ASSISTANT: Dr. Ruthann Cancer TECHNIQUE: Informed written consent was obtained from the patient after a thorough discussion of the procedural risks, benefits and alternatives. All questions were addressed. Maximal Sterile Barrier Technique was utilized including caps, mask, sterile gowns, sterile gloves, sterile drape, hand hygiene and skin antiseptic. A timeout was performed prior to the initiation of the procedure. Right neck and left groin skin prepped and draped in a sterile fashion. Ultrasound image documenting patency of the right internal jugular vein was obtained and placed in permanent medical record. Sterile ultrasound probe cover and gel utilized throughout the procedure. Utilizing continuous ultrasound guidance, the right internal jugular vein was accessed with a 21 gauge needle. 21 gauge needle exchanged for a transitional dilator set over 0.018 inch guidewire. Transitional dilator set exchanged for 9 French sheath over 0.035 inch guidewire. Kumpe catheter advanced to the  right external iliac vein. 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 The severe focal stenosis was confirmed with intravascular ultrasound. Intravascular ultrasound was also utilized to measure the diameter of normal vein proximal and distal to the site of stenosis. Ultrasound image documenting patency of the left common femoral vein was obtained and placed in permanent medical record. Sterile ultrasound probe cover and gel utilized throughout the procedure. Utilizing continuous ultrasound guidance, the left common femoral vein was accessed at the level of the femoral head with a 21 gauge needle. 21 gauge needle exchanged for a transitional dilator set over 0.018 inch guidewire. Transitional dilator set exchanged for 9 French sheath over 0.035 inch guidewire. Kumpe catheter advanced to the left external iliac vein. 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 Intravascular ultrasound evaluation of this region confirmed severe focal stenosis in the superior segment of the left common iliac vein. Intravascular ultrasound was utilized to determine normal diameter of inferior vena cava and common iliac vein distal and proximal to the stenosed segment. Ultrasound image documenting patency of the right internal jugular vein was obtained and placed in permanent medical record. Sterile ultrasound probe cover and gel utilized throughout the procedure. Utilizing continuous ultrasound guidance, the right internal jugular vein was accessed a second time with a 21 gauge needle. 21 gauge needle exchanged for a transitional dilator set over 0.018 inch guidewire. Transitional dilator set exchanged for 9 French sheath over 0.035 inch guidewire. Kumpe catheter and guidewire were advanced across the left common  iliac vein. Contrast administered  through the Kumpe the catheter at the level of the left external iliac vein confirmed appropriate intraluminal positioning. The stenosed segments of the common iliac veins bilaterally were dilated with 10 mm balloon. Bilateral 14 mm x 150 mm Abre stents were simultaneously deployed across the stenosed common iliac veins. The stents were gently simultaneously post dilated with 14 mm balloons. Bilateral lower extremity venogram was performed after post dilation of the stents, which showed excellent unobstructed flow to the inferior vena cava. No significant narrowing of the IVC was identified. Both stented segments were again evaluated with intravascular ultrasound. There was mild narrowing of the right and moderate narrowing of the left stent within the inferior vena cava, however given brisk flow through the uncovered stents, no additional venoplasty was performed. Right IJ and left CFV accesses were removed and hemostasis achieved with manual compression. 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 Electronically Signed   By: Miachel Roux M.D.   On: 09/25/2022 10:49   IR Veno/Ext/Uni Left  Result Date: 09/25/2022 INDICATION: 67 year old woman with severe right lower extremity pain and swelling. CT of the abdomen and pelvis shows severe narrowing of both common iliac arteries. No right lower extremity DVT identified on Doppler examination. IR consulted for bilateral lower extremity venogram with possible venoplasty/stent placement EXAM: 1. Ultrasound guided access of right internal jugular vein (x2) 2. Ultrasound-guided access of left common femoral vein 3. Bilateral lower extremity venogram 4. Inferior vena cavogram 5. Bilateral lower extremity angioplasty and stent placement 6. IVUS evaluation of bilateral external and  common iliac veins and IVC COMPARISON:  None Available. MEDICATIONS: None. ANESTHESIA/SEDATION: MAC sedation performed and monitored by the anesthesia team. FLUOROSCOPY: Radiation Exposure Index (as provided by the fluoroscopic device): 123456 mGy Kerma COMPLICATIONS: None immediate. FIRST ASSISTANT: Dr. Ruthann Cancer TECHNIQUE: Informed written consent was obtained from the patient after a thorough discussion of the procedural risks, benefits and alternatives. All questions were addressed. Maximal Sterile Barrier Technique was utilized including caps, mask, sterile gowns, sterile gloves, sterile drape, hand hygiene and skin antiseptic. A timeout was performed prior to the initiation of the procedure. Right neck and left groin skin prepped and draped in a sterile fashion. Ultrasound image documenting patency of the right internal jugular vein was obtained and placed in permanent medical record. Sterile ultrasound probe cover and gel utilized throughout the procedure. Utilizing continuous ultrasound guidance, the right internal jugular vein was accessed with a 21 gauge needle. 21 gauge needle exchanged for a transitional dilator set over 0.018 inch guidewire. Transitional dilator set exchanged for 9 French sheath over 0.035 inch guidewire. Kumpe catheter advanced to the right external iliac vein. 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 The severe focal stenosis was confirmed with intravascular ultrasound. Intravascular ultrasound was also utilized to measure the diameter of normal vein proximal and distal to the site of stenosis. Ultrasound image documenting patency of the left common femoral vein was obtained and placed in permanent medical record. Sterile ultrasound probe cover and gel utilized throughout the procedure. Utilizing continuous ultrasound guidance, the left common femoral vein was accessed at the level of  the femoral head with a 21 gauge needle. 21 gauge needle exchanged for a transitional dilator set over 0.018 inch guidewire. Transitional dilator set exchanged for 9 French sheath over 0.035 inch  guidewire. Kumpe catheter advanced to the left external iliac vein. 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 Intravascular ultrasound evaluation of this region confirmed severe focal stenosis in the superior segment of the left common iliac vein. Intravascular ultrasound was utilized to determine normal diameter of inferior vena cava and common iliac vein distal and proximal to the stenosed segment. Ultrasound image documenting patency of the right internal jugular vein was obtained and placed in permanent medical record. Sterile ultrasound probe cover and gel utilized throughout the procedure. Utilizing continuous ultrasound guidance, the right internal jugular vein was accessed a second time with a 21 gauge needle. 21 gauge needle exchanged for a transitional dilator set over 0.018 inch guidewire. Transitional dilator set exchanged for 9 French sheath over 0.035 inch guidewire. Kumpe catheter and guidewire were advanced across the left common iliac vein. Contrast administered through the Kumpe the catheter at the level of the left external iliac vein confirmed appropriate intraluminal positioning. The stenosed segments of the common iliac veins bilaterally were dilated with 10 mm balloon. Bilateral 14 mm x 150 mm Abre stents were simultaneously deployed across the stenosed common iliac veins. The stents were gently simultaneously post dilated with 14 mm balloons. Bilateral lower extremity venogram was performed after post dilation of the stents, which showed excellent unobstructed flow to the inferior vena cava. No significant narrowing of the IVC was identified. Both stented segments were  again evaluated with intravascular ultrasound. There was mild narrowing of the right and moderate narrowing of the left stent within the inferior vena cava, however given brisk flow through the uncovered stents, no additional venoplasty was performed. Right IJ and left CFV accesses were removed and hemostasis achieved with manual compression. 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 Electronically Signed   By: Miachel Roux M.D.   On: 09/25/2022 10:49   IR TRANSCATH PLC STENT  INITIAL VEIN  INC ANGIOPLASTY  Result Date: 09/25/2022 INDICATION: 67 year old woman with severe right lower extremity pain and swelling. CT of the abdomen and pelvis shows severe narrowing of both common iliac arteries. No right lower extremity DVT identified on Doppler examination. IR consulted for bilateral lower extremity venogram with possible venoplasty/stent placement EXAM: 1. Ultrasound guided access of right internal jugular vein (x2) 2. Ultrasound-guided access of left common femoral vein 3. Bilateral lower extremity venogram 4. Inferior vena cavogram 5. Bilateral lower extremity angioplasty and stent placement 6. IVUS evaluation of bilateral external and common iliac veins and IVC COMPARISON:  None Available. MEDICATIONS: None. ANESTHESIA/SEDATION: MAC sedation performed and monitored by the anesthesia team. FLUOROSCOPY: Radiation Exposure Index (as provided by the fluoroscopic device): 123456 mGy Kerma COMPLICATIONS: None immediate. FIRST ASSISTANT: Dr. Ruthann Cancer TECHNIQUE: Informed written consent was obtained from the patient after a thorough discussion of the procedural risks, benefits and alternatives. All questions were addressed. Maximal Sterile Barrier Technique was utilized including caps, mask, sterile gowns, sterile gloves, sterile drape, hand hygiene and skin  antiseptic. A timeout was performed prior to the initiation of the procedure. Right neck and left groin skin prepped and draped in a sterile fashion. Ultrasound image documenting patency of the right internal jugular vein was obtained and placed in permanent medical record. Sterile ultrasound probe cover and gel utilized throughout the procedure. Utilizing continuous  ultrasound guidance, the right internal jugular vein was accessed with a 21 gauge needle. 21 gauge needle exchanged for a transitional dilator set over 0.018 inch guidewire. Transitional dilator set exchanged for 9 French sheath over 0.035 inch guidewire. Kumpe catheter advanced to the right external iliac vein. 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 The severe focal stenosis was confirmed with intravascular ultrasound. Intravascular ultrasound was also utilized to measure the diameter of normal vein proximal and distal to the site of stenosis. Ultrasound image documenting patency of the left common femoral vein was obtained and placed in permanent medical record. Sterile ultrasound probe cover and gel utilized throughout the procedure. Utilizing continuous ultrasound guidance, the left common femoral vein was accessed at the level of the femoral head with a 21 gauge needle. 21 gauge needle exchanged for a transitional dilator set over 0.018 inch guidewire. Transitional dilator set exchanged for 9 French sheath over 0.035 inch guidewire. Kumpe catheter advanced to the left external iliac vein. 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 Intravascular ultrasound evaluation of this region confirmed severe focal stenosis in the superior segment of the left common iliac vein. Intravascular ultrasound was utilized to determine normal  diameter of inferior vena cava and common iliac vein distal and proximal to the stenosed segment. Ultrasound image documenting patency of the right internal jugular vein was obtained and placed in permanent medical record. Sterile ultrasound probe cover and gel utilized throughout the procedure. Utilizing continuous ultrasound guidance, the right internal jugular vein was accessed a second time with a 21 gauge needle. 21 gauge needle exchanged for a transitional dilator set over 0.018 inch guidewire. Transitional dilator set exchanged for 9 French sheath over 0.035 inch guidewire. Kumpe catheter and guidewire were advanced across the left common iliac vein. Contrast administered through the Kumpe the catheter at the level of the left external iliac vein confirmed appropriate intraluminal positioning. The stenosed segments of the common iliac veins bilaterally were dilated with 10 mm balloon. Bilateral 14 mm x 150 mm Abre stents were simultaneously deployed across the stenosed common iliac veins. The stents were gently simultaneously post dilated with 14 mm balloons. Bilateral lower extremity venogram was performed after post dilation of the stents, which showed excellent unobstructed flow to the inferior vena cava. No significant narrowing of the IVC was identified. Both stented segments were again evaluated with intravascular ultrasound. There was mild narrowing of the right and moderate narrowing of the left stent within the inferior vena cava, however given brisk flow through the uncovered stents, no additional venoplasty was performed. Right IJ and left CFV accesses were removed and hemostasis achieved with manual compression. 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 Electronically Signed   By: Miachel Roux M.D.   On: 09/25/2022 10:49    Assessment: 67  y.o. 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  Will continue to control her pain.  She will try compression stocks, or wrapping. She had outpatient physical therapy for her lymphedema also.  She can continue follow-up with them after discharge. -I have increased her oxycodone from 5 mg to 10 mg as needed for her breakthrough pain, and changed her Robaxin from IV to oral, she will continue OxyContin twice a day. -Discharge per the primary team and IR -She is scheduled to see [place] back and chemotherapy next Thursday.   [name], MD 09/25/2022

## 2022-09-26 DIAGNOSIS — N9089 Other specified noninflammatory disorders of vulva and perineum: Secondary | ICD-10-CM | POA: Insufficient documentation

## 2022-09-26 MED ORDER — METHOCARBAMOL 1000 MG PO TABS: 1000.0000 mg | ORAL_TABLET | Freq: Four times a day (QID) | ORAL | 0 refills | Status: DC | PRN

## 2022-09-26 MED ORDER — SENNOSIDES-DOCUSATE SODIUM 8.6-50 MG PO TABS: 1.0000 | ORAL_TABLET | Freq: Two times a day (BID) | ORAL | 0 refills | Status: AC

## 2022-09-26 MED ORDER — HEPARIN SOD (PORK) LOCK FLUSH 100 UNIT/ML IV SOLN
500.0000 [IU] | INTRAVENOUS | Status: AC | PRN
  Administered 2022-09-26: 500 [IU]
  Filled 2022-09-26: qty 5

## 2022-09-26 MED ORDER — POLYETHYLENE GLYCOL 3350 17 G PO PACK: 17.0000 g | PACK | Freq: Every day | ORAL | 0 refills | Status: DC

## 2022-09-26 MED ORDER — OXYCODONE HCL ER 15 MG PO T12A: 15.0000 mg | EXTENDED_RELEASE_TABLET | Freq: Two times a day (BID) | ORAL | 0 refills | Status: AC

## 2022-09-26 MED ORDER — OXYCODONE HCL 10 MG PO TABS: 10.0000 mg | ORAL_TABLET | Freq: Four times a day (QID) | ORAL | 0 refills | Status: DC | PRN

## 2022-09-26 NOTE — TOC Initial Note (Signed)
Transition of Care [facility]) - Initial/Assessment Note    Patient Details  Name: Kristin Ward MRN: [id number] Date of Birth: Nov 16, 1955  [facility]) CM/SW Contact:    Zenon Mayo, RN Phone Number: 09/26/2022, 10:34 AM  Clinical Narrative:                 Patient is for dc today, has no needs. Will need portacath to be deaccessed prior to dc per staff RN.  Expected Discharge Plan: Home/Self Care Barriers to Discharge: No Barriers Identified   Patient Goals and CMS Choice Patient states their goals for this hospitalization and ongoing recovery are:: return home   Choice offered to / list presented to : NA      Expected Discharge Plan and Services In-house Referral: NA Discharge Planning Services: CM Consult Post Acute Care Choice: NA   Expected Discharge Date: 09/26/22               DME Arranged: N/A DME Agency: NA       HH Arranged: NA          Prior Living Arrangements/Services   Lives with:: Adult Children Patient language and need for interpreter reviewed:: Yes Do you feel safe going back to the place where you live?: Yes      Need for Family Participation in Patient Care: Yes (Comment) Care giver support system in place?: Yes (comment)   Criminal Activity/Legal Involvement Pertinent to Current Situation/Hospitalization: No - Comment as needed  Activities of Daily Living Home Assistive Devices/Equipment: Cane (specify quad or straight) ADL Screening (condition at time of admission) Patient's cognitive ability adequate to safely complete daily activities?: Yes Is the patient deaf or have difficulty hearing?: No Does the patient have difficulty seeing, even when wearing glasses/contacts?: Yes Does the patient have difficulty concentrating, remembering, or making decisions?: No Patient able to express need for assistance with ADLs?: Yes Does the patient have difficulty dressing or bathing?: No Independently performs ADLs?: Yes  (appropriate for developmental age) Does the patient have difficulty walking or climbing stairs?: Yes Weakness of Legs: Right Weakness of Arms/Hands: None  Permission Sought/Granted                  Emotional Assessment   Attitude/Demeanor/Rapport: Engaged Affect (typically observed): Appropriate Orientation: : Oriented to Self, Oriented to Place, Oriented to  Time, Oriented to Situation Alcohol / Substance Use: Not Applicable Psych Involvement: No (comment)  Admission diagnosis:  Intractable pain [R52] Patient Active Problem List   Diagnosis Date Noted   Intractable pain 09/18/2022   Type 2 diabetes mellitus ([place]) 09/18/2022   Unilateral edema of lower extremity 09/18/2022   Leukopenia 09/18/2022   Peripheral neuropathy due to chemotherapy ([place]) 07/26/2022   Cancer related pain 07/26/2022   Swelling 04/16/2022   Constipation 02/21/2022   Hypertension associated with diabetes ([place]) 11/26/2021   Class 1 obesity due to excess calories with serious comorbidity and body mass index (BMI) of 33.0 to 33.9 in adult 11/26/2021   Pain due to onychomycosis of toenails of both feet 11/21/2021   Neuropathy associated with cancer ([place]) 11/21/2021   History of chemotherapy 09/14/2021   Uncontrolled type 2 diabetes mellitus with hyperglycemia ([place]) 03/08/2021   Neuropathy 01/12/2021   Situational depression 01/12/2021   Goals of care, counseling/discussion 09/15/2020   Malignant neoplasm metastatic to bone ([place]) 08/30/2020   Pain from bone metastases ([place]) 08/30/2020   Right hip pain 08/22/2020   Lymph nodes enlarged 07/25/2020   Retroperitoneal  lymphadenopathy 07/25/2020   History of breast cancer 05/11/2020   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   Chronic right hip pain 04/05/2020   Vitamin D deficiency 10/07/2019   Elevated serum creatinine 10/07/2019   Mixed hyperlipidemia 06/17/2018   Influenza vaccination declined 06/17/2018   Obesity (BMI 30-39.9) 06/17/2018    Port-A-Cath in place 09/08/2017   Other fatigue 08/29/2017   Anemia 08/29/2017   High risk medication use 08/29/2017   Syncope 08/25/2017   Blind in both eyes 08/25/2017   Genetic testing 06/19/2017   Family history of colon cancer    Malignant neoplasm of lower-outer quadrant of left breast of female, estrogen receptor negative ([place]) 06/11/2017   Cholelithiasis 05/19/2017   Coronary artery calcification seen on CAT scan 05/19/2017   Low grade squamous intraepith lesion on cytologic smear cervix ([place]) 04/09/2017   HPV (human papilloma virus) infection 04/09/2017   COPD (chronic obstructive pulmonary disease) ([place])    Essential hypertension 03/20/2017   Screen for colon cancer 03/20/2017   Retinitis pigmentosa    Blind    Solitary pulmonary nodule on lung CT 03/16/2016   Abnormal myocardial perfusion study 03/15/2016   Chronic combined systolic and diastolic heart failure ([place]) 12/28/2015   Dilated cardiomyopathy ([place]) 12/27/2015   PCP:  [name], MD Pharmacy:   [id number] [id number] [id number]: [phone]: [phone]    Social Determinants of Health (SDOH) Social History: SDOH Screenings   Food Insecurity: No Food Insecurity (09/18/2022)  Housing: Low Risk  (09/18/2022)  Transportation Needs: No Transportation Needs (09/18/2022)  Utilities: Not At Risk (09/18/2022)  Depression (PHQ2-9): Low Risk  (08/09/2022)  Financial Resource Strain: Low Risk  (08/09/2022)  Physical Activity: Sufficiently Active (08/09/2022)  Social Connections: Unknown (09/01/2020)  Stress: No Stress Concern Present (08/09/2022)  Tobacco Use: Medium Risk (09/26/2022)   SDOH Interventions: Housing Interventions: Intervention Not Indicated   Readmission Risk Interventions    09/26/2022   10:29 AM  Readmission Risk Prevention Plan  Transportation Screening Complete  PCP or  Specialist Appt within 3-5 Days Complete  HRI or [place]  Social Work Consult for [place]/Counseling Complete  Palliative Care Screening Not Applicable  Medication Review (Press photographer Complete

## 2022-09-26 NOTE — Discharge Summary (Signed)
Physician Discharge Summary  Kristin Ward [id number] DOB: 1955/11/06 DOA: 09/18/2022  PCP: [name], MD  Admit date: 09/18/2022 Discharge date: 09/26/2022  Admitted From: Home Disposition: Home with home health  Recommendations for Outpatient Follow-up:  Follow up with PCP in 1-2 weeks Follow-up at oncology clinic as a scheduled  Home Health: PT/OT Equipment/Devices: Walker  Discharge Condition: Fair CODE STATUS: Full code Diet recommendation: Low-salt and low-carb diet, nutritional supplements.  Discharge summary: 67 year old with type 2 diabetes, chronic combined heart failure, hypertension hyperlipidemia, breast cancer with mets to bone, cancer related pain, peripheral neuropathy secondary to chemotherapy was sent from cancer center for admission of management of progressive worsening right lower extremity edema and pain.  She was transferred to [facility] for IR guided right lower extremity venography and angioplasty.  Remained in the hospital due to ongoing pain and need for procedure.  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  Leg edema remains unchanged.  Pain is controlled with oral pain medication regimen as below.     Assessment & Plan:   Right lower extremity pain with edema: Extensive edema of the right lower extremity related to right iliac vein compression in the pelvis secondary to metastatic cancer around the pelvis and right groin. Venous duplex is negative for DVT of the legs. IR-right lower extremity venogram with angioplasty and venous stenting 2/13.  Remains on aspirin now. Mobilize.  Elevate right leg. Could not fit compression stockings.  Outpatient physical therapy for lymphedema treatment referral made by oncology.  Fall precautions.   Metastatic left breast cancer, metastatic pain: Patient apparently on chemotherapy, planning for palliative  radiation. Pain control remains an issue.   Increased OxyContin to 15 mg twice daily, increased oxycodone to 10 mg every 4 hours as needed.  Bowel regimen to continue.  Also continued on Robaxin. She will follow-up with oncology. Narcotic prescriptions were provided for 1 week.   Abnormal finding of urinary bladder on imaging: CT scan abdomen pelvis showed focal area of thickened appearance of the right posterior bladder.  Mild right hydronephrosis.  This case was discussed with oncology and urology, they will follow outpatient.   Chronic combined heart failure: Compensated.  On furosemide, Entresto and carvedilol.  Resume today.  Essential hypertension: Stable on carvedilol.  Resumed.  Hyperlipidemia: On Lipitor.  Stable.  Tolerating.  Peripheral neuropathy: On gabapentin.  Fairly stable today for discharge home with outpatient follow-up.    Discharge Diagnoses:  Principal Problem:   Intractable pain Active Problems:   Chronic combined systolic and diastolic heart failure (HCC)   Essential hypertension   COPD (chronic obstructive pulmonary disease) (HCC)   Malignant neoplasm of lower-outer quadrant of left breast of female, estrogen receptor negative (HCC)   Anemia   Mixed hyperlipidemia   Malignant neoplasm metastatic to bone [facility])   Peripheral neuropathy due to chemotherapy ([place])   Type 2 diabetes mellitus (HCC)   Unilateral edema of lower extremity   Leukopenia    Discharge Instructions  Discharge Instructions     Diet - low sodium heart healthy   Complete by: As directed    Increase activity slowly   Complete by: As directed    No wound care   Complete by: As directed       Allergies as of 09/26/2022       Reactions   Morphine And Related Nausea And Vomiting   Latex Hives, Itching   Burning  Tylenol With Codeine #3 [acetaminophen-codeine] Nausea And Vomiting   Penicillins Nausea Only, Swelling, Rash   Has patient had a PCN reaction causing immediate  rash, facial/tongue/throat swelling, SOB or lightheadedness with hypotension: Yes Has patient had a PCN reaction causing severe rash involving mucus membranes or skin necrosis: Yes Has patient had a PCN reaction that required hospitalization: No Has patient had a PCN reaction occurring within the last 10 years: No TONGUE SWELLING AND RASH AROUND MOUTH If all of the above answers are "NO", then may proceed with Cephalosporin use.        Medication List     STOP taking these medications    amLODipine 5 MG tablet Commonly known as: NORVASC   prochlorperazine 10 MG tablet Commonly known as: COMPAZINE   promethazine 25 MG tablet Commonly known as: PHENERGAN       TAKE these medications    Accu-Chek FastClix Lancets Misc TEST TWICE A DAY. PT USES AN ACCU-CHEK GUIDE ME METER   Accu-Chek Softclix Lancet Dev Kit 1 Units by Does not apply route 2 (two) times daily. Check FSBS BID - Include strips # 50 with 5 refills, Lancets #50 with 5 refills DX: Type 2 DM - ICD 10: E11.9   aspirin 81 MG chewable tablet Chew 81 mg by mouth daily.   atorvastatin 40 MG tablet Commonly known as: LIPITOR Take 1 tablet (40 mg total) by mouth daily.   brimonidine 0.2 % ophthalmic solution Commonly known as: ALPHAGAN Place 1 drop into both eyes 2 (two) times daily.   carvedilol 12.5 MG tablet Commonly known as: COREG TAKE 1 AND 1/2 TABLETS BY MOUTH TWICE DAILY qm What changed: See the new instructions.   cyanocobalamin 1000 MCG tablet Commonly known as: VITAMIN B12 Take 1,000 mcg by mouth daily.   empagliflozin 10 MG Tabs tablet Commonly known as: Jardiance Take 1 tablet (10 mg total) by mouth daily before breakfast.   gabapentin 300 MG capsule Commonly known as: NEURONTIN Take 3 caps twice a day during the day and 4 caps at bedtime What changed:  how much to take how to take this when to take this additional instructions   ibuprofen 800 MG tablet Commonly known as: ADVIL Take 1  tablet (800 mg total) by mouth every 8 (eight) hours as needed. What changed: reasons to take this   lidocaine-prilocaine cream Commonly known as: EMLA Apply 1 application topically as needed.   linaclotide 72 MCG capsule Commonly known as: Linzess Take 1 capsule (72 mcg total) by mouth daily as needed (constipation).   magic mouthwash (nystatin, lidocaine, diphenhydrAMINE, alum & mag hydroxide) suspension Swish and swallow 5 mLs by mouth 4 (four) times daily as needed for mouth pain   Magnesium 300 MG Caps Take 300 mg by mouth 2 (two) times a week.   Methocarbamol 1000 MG Tabs Take 1,000 mg by mouth every 6 (six) hours as needed for muscle spasms.   ondansetron 8 MG tablet Commonly known as: ZOFRAN Take 1 tablet (8 mg total) by mouth every 8 (eight) hours as needed for nausea or vomiting (begin on day 3 after chemo).   Oxycodone HCl 10 MG Tabs Take 1 tablet (10 mg total) by mouth every 6 (six) hours as needed for up to 5 days for severe pain or moderate pain. What changed:  medication strength how much to take reasons to take this   oxyCODONE 15 mg 12 hr tablet Commonly known as: OXYCONTIN Take 1 tablet (15 mg total) by mouth  every 12 (twelve) hours for 7 days. What changed: You were already taking a medication with the same name, and this prescription was added. Make sure you understand how and when to take each.   pantoprazole 40 MG tablet Commonly known as: PROTONIX TAKE 1 TABLET BY MOUTH EVERY DAY What changed:  when to take this reasons to take this   polyethylene glycol 17 g packet Commonly known as: MIRALAX / GLYCOLAX Take 17 g by mouth daily.   sacubitril-valsartan 97-103 MG Commonly known as: ENTRESTO Take 1 tablet by mouth 2 (two) times daily.   senna-docusate 8.6-50 MG tablet Commonly known as: Senokot-S Take 1 tablet by mouth 2 (two) times daily.   spironolactone 25 MG tablet Commonly known as: ALDACTONE Take 1 tablet (25 mg total) by mouth  daily.   Vitamin C 500 MG Caps Take 500 mg by mouth daily.   Vitamin D 50 MCG (2000 UT) Caps Take 2,000 Units by mouth daily.   WOMENS MULTI PO Take 15 mLs by mouth daily.   zinc gluconate 50 MG tablet Take 50 mg by mouth daily.         Follow-up Information     [name], MD Follow up.   Specialties: Hematology, Oncology Why: Please follow up in a week. Contact information: [place] [address] [phone]                Allergies  Allergen Reactions   Morphine And Related Nausea And Vomiting   Latex Hives and Itching    Burning    Tylenol With Codeine #3 [Acetaminophen-Codeine] Nausea And Vomiting   Penicillins Nausea Only, Swelling and Rash    Has patient had a PCN reaction causing immediate rash, facial/tongue/throat swelling, SOB or lightheadedness with hypotension: Yes Has patient had a PCN reaction causing severe rash involving mucus membranes or skin necrosis: Yes Has patient had a PCN reaction that required hospitalization: No Has patient had a PCN reaction occurring within the last 10 years: No TONGUE SWELLING AND RASH AROUND MOUTH If all of the above answers are "NO", then may proceed with Cephalosporin use.     Consultations: Oncology Interventional radiology   Procedures/Studies: IR Venocavagram Ivc  Result Date: 09/25/2022 INDICATION: 67 year old woman with severe right lower extremity pain and swelling. CT of the abdomen and pelvis shows severe narrowing of both common iliac arteries. No right lower extremity DVT identified on Doppler examination. IR consulted for bilateral lower extremity venogram with possible venoplasty/stent placement EXAM: 1. Ultrasound guided access of right internal jugular vein (x2) 2. Ultrasound-guided access of left common femoral vein 3. Bilateral lower extremity venogram 4. Inferior vena cavogram 5. Bilateral lower extremity angioplasty and stent placement 6. IVUS evaluation of bilateral  external and common iliac veins and IVC COMPARISON:  None Available. MEDICATIONS: None. ANESTHESIA/SEDATION: MAC sedation performed and monitored by the anesthesia team. FLUOROSCOPY: Radiation Exposure Index (as provided by the fluoroscopic device): 123456 mGy Kerma COMPLICATIONS: None immediate. FIRST ASSISTANT: Dr. Ruthann Cancer TECHNIQUE: Informed written consent was obtained from the patient after a thorough discussion of the procedural risks, benefits and alternatives. All questions were addressed. Maximal Sterile Barrier Technique was utilized including caps, mask, sterile gowns, sterile gloves, sterile drape, hand hygiene and skin antiseptic. A timeout was performed prior to the initiation of the procedure. Right neck and left groin skin prepped and draped in a sterile fashion. Ultrasound image documenting patency of the right internal jugular vein was obtained and placed in permanent medical  record. Sterile ultrasound probe cover and gel utilized throughout the procedure. Utilizing continuous ultrasound guidance, the right internal jugular vein was accessed with a 21 gauge needle. 21 gauge needle exchanged for a transitional dilator set over 0.018 inch guidewire. Transitional dilator set exchanged for 9 French sheath over 0.035 inch guidewire. Kumpe catheter advanced to the right external iliac vein. 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 The severe focal stenosis was confirmed with intravascular ultrasound. Intravascular ultrasound was also utilized to measure the diameter of normal vein proximal and distal to the site of stenosis. Ultrasound image documenting patency of the left common femoral vein was obtained and placed in permanent medical record. Sterile ultrasound probe cover and gel utilized throughout the procedure. Utilizing continuous ultrasound guidance, the left common femoral vein was accessed at  the level of the femoral head with a 21 gauge needle. 21 gauge needle exchanged for a transitional dilator set over 0.018 inch guidewire. Transitional dilator set exchanged for 9 French sheath over 0.035 inch guidewire. Kumpe catheter advanced to the left external iliac vein. 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 Intravascular ultrasound evaluation of this region confirmed severe focal stenosis in the superior segment of the left common iliac vein. Intravascular ultrasound was utilized to determine normal diameter of inferior vena cava and common iliac vein distal and proximal to the stenosed segment. Ultrasound image documenting patency of the right internal jugular vein was obtained and placed in permanent medical record. Sterile ultrasound probe cover and gel utilized throughout the procedure. Utilizing continuous ultrasound guidance, the right internal jugular vein was accessed a second time with a 21 gauge needle. 21 gauge needle exchanged for a transitional dilator set over 0.018 inch guidewire. Transitional dilator set exchanged for 9 French sheath over 0.035 inch guidewire. Kumpe catheter and guidewire were advanced across the left common iliac vein. Contrast administered through the Kumpe the catheter at the level of the left external iliac vein confirmed appropriate intraluminal positioning. The stenosed segments of the common iliac veins bilaterally were dilated with 10 mm balloon. Bilateral 14 mm x 150 mm Abre stents were simultaneously deployed across the stenosed common iliac veins. The stents were gently simultaneously post dilated with 14 mm balloons. Bilateral lower extremity venogram was performed after post dilation of the stents, which showed excellent unobstructed flow to the inferior vena cava. No significant narrowing of the IVC was identified. Both stented  segments were again evaluated with intravascular ultrasound. There was mild narrowing of the right and moderate narrowing of the left stent within the inferior vena cava, however given brisk flow through the uncovered stents, no additional venoplasty was performed. Right IJ and left CFV accesses were removed and hemostasis achieved with manual compression. 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 Electronically Signed   By: Miachel Roux M.D.   On: 09/25/2022 12:23   IR TRANSCATH PLC STENT  EA ADD VEIN  INC ANGIOPLASTY  Result Date: 09/25/2022 INDICATION: 67 year old woman with severe right lower extremity pain and swelling. CT of the abdomen and pelvis shows severe narrowing of both common iliac arteries. No right lower extremity DVT identified on Doppler examination. IR consulted for bilateral lower extremity  venogram with possible venoplasty/stent placement EXAM: 1. Ultrasound guided access of right internal jugular vein (x2) 2. Ultrasound-guided access of left common femoral vein 3. Bilateral lower extremity venogram 4. Inferior vena cavogram 5. Bilateral lower extremity angioplasty and stent placement 6. IVUS evaluation of bilateral external and common iliac veins and IVC COMPARISON:  None Available. MEDICATIONS: None. ANESTHESIA/SEDATION: MAC sedation performed and monitored by the anesthesia team. FLUOROSCOPY: Radiation Exposure Index (as provided by the fluoroscopic device): 123456 mGy Kerma COMPLICATIONS: None immediate. FIRST ASSISTANT: Dr. Ruthann Cancer TECHNIQUE: Informed written consent was obtained from the patient after a thorough discussion of the procedural risks, benefits and alternatives. All questions were addressed. Maximal Sterile Barrier Technique was utilized including caps, mask, sterile gowns, sterile gloves, sterile drape, hand hygiene  and skin antiseptic. A timeout was performed prior to the initiation of the procedure. Right neck and left groin skin prepped and draped in a sterile fashion. Ultrasound image documenting patency of the right internal jugular vein was obtained and placed in permanent medical record. Sterile ultrasound probe cover and gel utilized throughout the procedure. Utilizing continuous ultrasound guidance, the right internal jugular vein was accessed with a 21 gauge needle. 21 gauge needle exchanged for a transitional dilator set over 0.018 inch guidewire. Transitional dilator set exchanged for 9 French sheath over 0.035 inch guidewire. Kumpe catheter advanced to the right external iliac vein. 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 The severe focal stenosis was confirmed with intravascular ultrasound. Intravascular ultrasound was also utilized to measure the diameter of normal vein proximal and distal to the site of stenosis. Ultrasound image documenting patency of the left common femoral vein was obtained and placed in permanent medical record. Sterile ultrasound probe cover and gel utilized throughout the procedure. Utilizing continuous ultrasound guidance, the left common femoral vein was accessed at the level of the femoral head with a 21 gauge needle. 21 gauge needle exchanged for a transitional dilator set over 0.018 inch guidewire. Transitional dilator set exchanged for 9 French sheath over 0.035 inch guidewire. Kumpe catheter advanced to the left external iliac vein. 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 Intravascular ultrasound evaluation of this region confirmed severe focal stenosis in the superior segment of the left common iliac vein. Intravascular ultrasound was utilized to  determine normal diameter of inferior vena cava and common iliac vein distal and proximal to the stenosed segment. Ultrasound image documenting patency of the right internal jugular vein was obtained and placed in permanent medical record. Sterile ultrasound probe cover and gel utilized throughout the procedure. Utilizing continuous ultrasound guidance, the right internal jugular vein was accessed a second time with a 21 gauge needle. 21 gauge needle exchanged for a transitional dilator set over 0.018 inch guidewire. Transitional dilator set exchanged for 9 French sheath over 0.035 inch guidewire. Kumpe catheter and guidewire were advanced across the left common iliac vein. Contrast administered through the Kumpe the catheter at the level of the left external iliac vein confirmed appropriate intraluminal positioning. The stenosed segments of the common iliac veins bilaterally were dilated with 10 mm balloon. Bilateral 14 mm x 150 mm Abre stents were simultaneously deployed across the stenosed common iliac veins. The stents were gently simultaneously post dilated with 14 mm balloons. Bilateral lower extremity venogram was performed after post  dilation of the stents, which showed excellent unobstructed flow to the inferior vena cava. No significant narrowing of the IVC was identified. Both stented segments were again evaluated with intravascular ultrasound. There was mild narrowing of the right and moderate narrowing of the left stent within the inferior vena cava, however given brisk flow through the uncovered stents, no additional venoplasty was performed. Right IJ and left CFV accesses were removed and hemostasis achieved with manual compression. 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 Electronically Signed   By: Miachel Roux M.D.   On: 09/25/2022 12:22    IR Veno/Ext/Uni Right  Result Date: 09/25/2022 INDICATION: 67 year old woman with severe right lower extremity pain and swelling. CT of the abdomen and pelvis shows severe narrowing of both common iliac arteries. No right lower extremity DVT identified on Doppler examination. IR consulted for bilateral lower extremity venogram with possible venoplasty/stent placement EXAM: 1. Ultrasound guided access of right internal jugular vein (x2) 2. Ultrasound-guided access of left common femoral vein 3. Bilateral lower extremity venogram 4. Inferior vena cavogram 5. Bilateral lower extremity angioplasty and stent placement 6. IVUS evaluation of bilateral external and common iliac veins and IVC COMPARISON:  None Available. MEDICATIONS: None. ANESTHESIA/SEDATION: MAC sedation performed and monitored by the anesthesia team. FLUOROSCOPY: Radiation Exposure Index (as provided by the fluoroscopic device): 123456 mGy Kerma COMPLICATIONS: None immediate. FIRST ASSISTANT: Dr. Ruthann Cancer TECHNIQUE: Informed written consent was obtained from the patient after a thorough discussion of the procedural risks, benefits and alternatives. All questions were addressed. Maximal Sterile Barrier Technique was utilized including caps, mask, sterile gowns, sterile gloves, sterile drape, hand hygiene and skin antiseptic. A timeout was performed prior to the initiation of the procedure. Right neck and left groin skin prepped and draped in a sterile fashion. Ultrasound image documenting patency of the right internal jugular vein was obtained and placed in permanent medical record. Sterile ultrasound probe cover and gel utilized throughout the procedure. Utilizing continuous ultrasound guidance, the right internal jugular vein was accessed with a 21 gauge needle. 21 gauge needle exchanged for a transitional dilator set over 0.018 inch guidewire. Transitional dilator set exchanged for 9 French sheath over 0.035 inch guidewire. Kumpe catheter  advanced to the right external iliac vein. 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 The severe focal stenosis was confirmed with intravascular ultrasound. Intravascular ultrasound was also utilized to measure the diameter of normal vein proximal and distal to the site of stenosis. Ultrasound image documenting patency of the left common femoral vein was obtained and placed in permanent medical record. Sterile ultrasound probe cover and gel utilized throughout the procedure. Utilizing continuous ultrasound guidance, the left common femoral vein was accessed at the level of the femoral head with a 21 gauge needle. 21 gauge needle exchanged for a transitional dilator set over 0.018 inch guidewire. Transitional dilator set exchanged for 9 French sheath over 0.035 inch guidewire. Kumpe catheter advanced to the left external iliac vein. 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 Intravascular ultrasound evaluation of this region confirmed severe focal stenosis in the superior segment of the left common iliac vein. Intravascular ultrasound was utilized to determine normal diameter of inferior vena  cava and common iliac vein distal and proximal to the stenosed segment. Ultrasound image documenting patency of the right internal jugular vein was obtained and placed in permanent medical record. Sterile ultrasound probe cover and gel utilized throughout the procedure. Utilizing continuous ultrasound guidance, the right internal jugular vein was accessed a second time with a 21 gauge needle. 21 gauge needle exchanged for a transitional dilator set over 0.018 inch guidewire. Transitional dilator set exchanged for 9 French sheath over 0.035 inch guidewire. Kumpe catheter and guidewire were advanced across  the left common iliac vein. Contrast administered through the Kumpe the catheter at the level of the left external iliac vein confirmed appropriate intraluminal positioning. The stenosed segments of the common iliac veins bilaterally were dilated with 10 mm balloon. Bilateral 14 mm x 150 mm Abre stents were simultaneously deployed across the stenosed common iliac veins. The stents were gently simultaneously post dilated with 14 mm balloons. Bilateral lower extremity venogram was performed after post dilation of the stents, which showed excellent unobstructed flow to the inferior vena cava. No significant narrowing of the IVC was identified. Both stented segments were again evaluated with intravascular ultrasound. There was mild narrowing of the right and moderate narrowing of the left stent within the inferior vena cava, however given brisk flow through the uncovered stents, no additional venoplasty was performed. Right IJ and left CFV accesses were removed and hemostasis achieved with manual compression. 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 Electronically Signed   By: Miachel Roux M.D.   On: 09/25/2022 10:49   IR US Guide Vasc Access Right  Result Date: 09/25/2022 INDICATION: 67 year old woman with severe right lower extremity pain and swelling. CT of the abdomen and pelvis shows severe narrowing of both common iliac arteries. No right lower extremity DVT identified on Doppler examination. IR consulted for bilateral lower extremity venogram with possible venoplasty/stent placement EXAM: 1. Ultrasound guided access of right internal jugular vein (x2) 2. Ultrasound-guided access of left common femoral vein 3. Bilateral lower extremity venogram 4. Inferior vena cavogram 5. Bilateral lower extremity angioplasty and stent placement 6. IVUS evaluation of  bilateral external and common iliac veins and IVC COMPARISON:  None Available. MEDICATIONS: None. ANESTHESIA/SEDATION: MAC sedation performed and monitored by the anesthesia team. FLUOROSCOPY: Radiation Exposure Index (as provided by the fluoroscopic device): 123456 mGy Kerma COMPLICATIONS: None immediate. FIRST ASSISTANT: Dr. Ruthann Cancer TECHNIQUE: Informed written consent was obtained from the patient after a thorough discussion of the procedural risks, benefits and alternatives. All questions were addressed. Maximal Sterile Barrier Technique was utilized including caps, mask, sterile gowns, sterile gloves, sterile drape, hand hygiene and skin antiseptic. A timeout was performed prior to the initiation of the procedure. Right neck and left groin skin prepped and draped in a sterile fashion. Ultrasound image documenting patency of the right internal jugular vein was obtained and placed in permanent medical record. Sterile ultrasound probe cover and gel utilized throughout the procedure. Utilizing continuous ultrasound guidance, the right internal jugular vein was accessed with a 21 gauge needle. 21 gauge needle exchanged for a transitional dilator set over 0.018 inch guidewire. Transitional dilator set exchanged for 9 French sheath over 0.035 inch guidewire. Kumpe catheter advanced to the right external iliac vein. 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 The severe focal  stenosis was confirmed with intravascular ultrasound. Intravascular ultrasound was also utilized to measure the diameter of normal vein proximal and distal to the site of stenosis. Ultrasound image documenting patency of the left common femoral vein was obtained and placed in permanent medical record. Sterile ultrasound probe cover and gel utilized throughout the procedure. Utilizing continuous ultrasound guidance, the left common femoral vein was  accessed at the level of the femoral head with a 21 gauge needle. 21 gauge needle exchanged for a transitional dilator set over 0.018 inch guidewire. Transitional dilator set exchanged for 9 French sheath over 0.035 inch guidewire. Kumpe catheter advanced to the left external iliac vein. 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 Intravascular ultrasound evaluation of this region confirmed severe focal stenosis in the superior segment of the left common iliac vein. Intravascular ultrasound was utilized to determine normal diameter of inferior vena cava and common iliac vein distal and proximal to the stenosed segment. Ultrasound image documenting patency of the right internal jugular vein was obtained and placed in permanent medical record. Sterile ultrasound probe cover and gel utilized throughout the procedure. Utilizing continuous ultrasound guidance, the right internal jugular vein was accessed a second time with a 21 gauge needle. 21 gauge needle exchanged for a transitional dilator set over 0.018 inch guidewire. Transitional dilator set exchanged for 9 French sheath over 0.035 inch guidewire. Kumpe catheter and guidewire were advanced across the left common iliac vein. Contrast administered through the Kumpe the catheter at the level of the left external iliac vein confirmed appropriate intraluminal positioning. The stenosed segments of the common iliac veins bilaterally were dilated with 10 mm balloon. Bilateral 14 mm x 150 mm Abre stents were simultaneously deployed across the stenosed common iliac veins. The stents were gently simultaneously post dilated with 14 mm balloons. Bilateral lower extremity venogram was performed after post dilation of the stents, which showed excellent unobstructed flow to the inferior vena cava. No significant narrowing of the IVC was identified. Both  stented segments were again evaluated with intravascular ultrasound. There was mild narrowing of the right and moderate narrowing of the left stent within the inferior vena cava, however given brisk flow through the uncovered stents, no additional venoplasty was performed. Right IJ and left CFV accesses were removed and hemostasis achieved with manual compression. 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 Electronically Signed   By: Miachel Roux M.D.   On: 09/25/2022 10:49   IR IVUS EACH ADDITIONAL NON CORONARY VESSEL  Result Date: 09/25/2022 INDICATION: 67 year old woman with severe right lower extremity pain and swelling. CT of the abdomen and pelvis shows severe narrowing of both common iliac arteries. No right lower extremity DVT identified on Doppler examination. IR consulted for bilateral lower extremity venogram with possible venoplasty/stent placement EXAM: 1. Ultrasound guided access of right internal jugular vein (x2) 2. Ultrasound-guided access of left common femoral vein 3. Bilateral lower extremity venogram 4. Inferior vena cavogram 5. Bilateral lower extremity angioplasty and stent placement 6. IVUS evaluation of bilateral external and common iliac veins and IVC COMPARISON:  None Available. MEDICATIONS: None. ANESTHESIA/SEDATION: MAC sedation performed and monitored by the anesthesia team. FLUOROSCOPY: Radiation Exposure Index (as provided by the fluoroscopic device): 123456 mGy Kerma COMPLICATIONS: None immediate. FIRST ASSISTANT: Dr. Ruthann Cancer  TECHNIQUE: Informed written consent was obtained from the patient after a thorough discussion of the procedural risks, benefits and alternatives. All questions were addressed. Maximal Sterile Barrier Technique was utilized including caps, mask, sterile gowns, sterile gloves, sterile drape, hand hygiene  and skin antiseptic. A timeout was performed prior to the initiation of the procedure. Right neck and left groin skin prepped and draped in a sterile fashion. Ultrasound image documenting patency of the right internal jugular vein was obtained and placed in permanent medical record. Sterile ultrasound probe cover and gel utilized throughout the procedure. Utilizing continuous ultrasound guidance, the right internal jugular vein was accessed with a 21 gauge needle. 21 gauge needle exchanged for a transitional dilator set over 0.018 inch guidewire. Transitional dilator set exchanged for 9 French sheath over 0.035 inch guidewire. Kumpe catheter advanced to the right external iliac vein. 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 The severe focal stenosis was confirmed with intravascular ultrasound. Intravascular ultrasound was also utilized to measure the diameter of normal vein proximal and distal to the site of stenosis. Ultrasound image documenting patency of the left common femoral vein was obtained and placed in permanent medical record. Sterile ultrasound probe cover and gel utilized throughout the procedure. Utilizing continuous ultrasound guidance, the left common femoral vein was accessed at the level of the femoral head with a 21 gauge needle. 21 gauge needle exchanged for a transitional dilator set over 0.018 inch guidewire. Transitional dilator set exchanged for 9 French sheath over 0.035 inch guidewire. Kumpe catheter advanced to the left external iliac vein. 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 Intravascular ultrasound evaluation of this region confirmed severe focal stenosis in the superior segment of the left common iliac vein. Intravascular ultrasound was utilized to  determine normal diameter of inferior vena cava and common iliac vein distal and proximal to the stenosed segment. Ultrasound image documenting patency of the right internal jugular vein was obtained and placed in permanent medical record. Sterile ultrasound probe cover and gel utilized throughout the procedure. Utilizing continuous ultrasound guidance, the right internal jugular vein was accessed a second time with a 21 gauge needle. 21 gauge needle exchanged for a transitional dilator set over 0.018 inch guidewire. Transitional dilator set exchanged for 9 French sheath over 0.035 inch guidewire. Kumpe catheter and guidewire were advanced across the left common iliac vein. Contrast administered through the Kumpe the catheter at the level of the left external iliac vein confirmed appropriate intraluminal positioning. The stenosed segments of the common iliac veins bilaterally were dilated with 10 mm balloon. Bilateral 14 mm x 150 mm Abre stents were simultaneously deployed across the stenosed common iliac veins. The stents were gently simultaneously post dilated with 14 mm balloons. Bilateral lower extremity venogram was performed after post dilation of the stents, which showed excellent unobstructed flow to the inferior vena cava. No significant narrowing of the IVC was identified. Both stented segments were again evaluated with intravascular ultrasound. There was mild narrowing of the right and moderate narrowing of the left stent within the inferior vena cava, however given brisk flow through the uncovered stents, no additional venoplasty was performed. Right IJ and left CFV accesses were removed and hemostasis achieved with manual compression. 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 Electronically Signed   By: Miachel Roux M.D.   On: 09/25/2022 10:49    IR IVUS EACH ADDITIONAL NON CORONARY VESSEL  Result Date: 09/25/2022 INDICATION: 67 year old woman with severe right lower extremity pain and swelling. CT of the abdomen and pelvis shows severe narrowing of both common iliac arteries. No right lower extremity DVT identified on Doppler examination. IR consulted for bilateral lower extremity venogram with possible venoplasty/stent placement EXAM: 1. Ultrasound guided access of right internal jugular vein (x2) 2. Ultrasound-guided access of left common femoral vein 3. Bilateral lower extremity venogram 4. Inferior vena cavogram 5. Bilateral lower extremity angioplasty and stent placement 6. IVUS evaluation of bilateral external and common iliac veins and IVC COMPARISON:  None Available. MEDICATIONS: None. ANESTHESIA/SEDATION: MAC sedation performed and monitored by the anesthesia team. FLUOROSCOPY: Radiation Exposure Index (as provided by the fluoroscopic device): 123456 mGy Kerma COMPLICATIONS: None immediate. FIRST ASSISTANT: Dr. Ruthann Cancer TECHNIQUE: Informed written consent was obtained from the patient after a thorough discussion of the procedural risks, benefits and alternatives. All questions were addressed. Maximal Sterile Barrier Technique was utilized including caps, mask, sterile gowns, sterile gloves, sterile drape, hand hygiene and skin antiseptic. A timeout was performed prior to the initiation of the procedure. Right neck and left groin skin prepped and draped in a sterile fashion. Ultrasound image documenting patency of the right internal jugular vein was obtained and placed in permanent medical record. Sterile ultrasound probe cover and gel utilized throughout the procedure. Utilizing continuous ultrasound guidance, the right internal jugular vein was accessed with a 21 gauge needle. 21 gauge needle exchanged for a transitional dilator set over 0.018 inch guidewire. Transitional dilator set exchanged for 9 French sheath over 0.035 inch guidewire.  Kumpe catheter advanced to the right external iliac vein. 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 The severe focal stenosis was confirmed with intravascular ultrasound. Intravascular ultrasound was also utilized to measure the diameter of normal vein proximal and distal to the site of stenosis. Ultrasound image documenting patency of the left common femoral vein was obtained and placed in permanent medical record. Sterile ultrasound probe cover and gel utilized throughout the procedure. Utilizing continuous ultrasound guidance, the left common femoral vein was accessed at the level of the femoral head with a 21 gauge needle. 21 gauge needle exchanged for a transitional dilator set over 0.018 inch guidewire. Transitional dilator set exchanged for 9 French sheath over 0.035 inch guidewire. Kumpe catheter advanced to the left external iliac vein. 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 Intravascular ultrasound evaluation of this region confirmed severe focal stenosis in the superior segment of the left common iliac vein. Intravascular ultrasound was utilized to determine normal diameter of inferior vena cava and common iliac vein distal and proximal to the stenosed segment. Ultrasound image documenting patency of the right internal jugular vein was obtained and placed in permanent medical record. Sterile ultrasound probe cover and gel utilized throughout the procedure. Utilizing continuous ultrasound guidance, the right internal jugular vein was accessed a second time with a 21 gauge needle. 21 gauge needle exchanged for a transitional dilator set over 0.018 inch guidewire. Transitional dilator set exchanged for 9 French sheath over 0.035 inch guidewire. Kumpe catheter  and guidewire were  advanced across the left common iliac vein. Contrast administered through the Kumpe the catheter at the level of the left external iliac vein confirmed appropriate intraluminal positioning. The stenosed segments of the common iliac veins bilaterally were dilated with 10 mm balloon. Bilateral 14 mm x 150 mm Abre stents were simultaneously deployed across the stenosed common iliac veins. The stents were gently simultaneously post dilated with 14 mm balloons. Bilateral lower extremity venogram was performed after post dilation of the stents, which showed excellent unobstructed flow to the inferior vena cava. No significant narrowing of the IVC was identified. Both stented segments were again evaluated with intravascular ultrasound. There was mild narrowing of the right and moderate narrowing of the left stent within the inferior vena cava, however given brisk flow through the uncovered stents, no additional venoplasty was performed. Right IJ and left CFV accesses were removed and hemostasis achieved with manual compression. 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 Electronically Signed   By: Miachel Roux M.D.   On: 09/25/2022 10:49   IR Veno/Ext/Uni Left  Result Date: 09/25/2022 INDICATION: 67 year old woman with severe right lower extremity pain and swelling. CT of the abdomen and pelvis shows severe narrowing of both common iliac arteries. No right lower extremity DVT identified on Doppler examination. IR consulted for bilateral lower extremity venogram with possible venoplasty/stent placement EXAM: 1. Ultrasound guided access of right internal jugular vein (x2) 2. Ultrasound-guided access of left common femoral vein 3. Bilateral lower extremity venogram 4. Inferior vena cavogram 5. Bilateral lower extremity angioplasty and stent placement 6. IVUS evaluation  of bilateral external and common iliac veins and IVC COMPARISON:  None Available. MEDICATIONS: None. ANESTHESIA/SEDATION: MAC sedation performed and monitored by the anesthesia team. FLUOROSCOPY: Radiation Exposure Index (as provided by the fluoroscopic device): 123456 mGy Kerma COMPLICATIONS: None immediate. FIRST ASSISTANT: Dr. Ruthann Cancer TECHNIQUE: Informed written consent was obtained from the patient after a thorough discussion of the procedural risks, benefits and alternatives. All questions were addressed. Maximal Sterile Barrier Technique was utilized including caps, mask, sterile gowns, sterile gloves, sterile drape, hand hygiene and skin antiseptic. A timeout was performed prior to the initiation of the procedure. Right neck and left groin skin prepped and draped in a sterile fashion. Ultrasound image documenting patency of the right internal jugular vein was obtained and placed in permanent medical record. Sterile ultrasound probe cover and gel utilized throughout the procedure. Utilizing continuous ultrasound guidance, the right internal jugular vein was accessed with a 21 gauge needle. 21 gauge needle exchanged for a transitional dilator set over 0.018 inch guidewire. Transitional dilator set exchanged for 9 French sheath over 0.035 inch guidewire. Kumpe catheter advanced to the right external iliac vein. 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 The severe focal stenosis was confirmed with intravascular ultrasound. Intravascular ultrasound was also utilized to measure the diameter of normal vein proximal and distal to the site of stenosis. Ultrasound image documenting patency of the left common femoral vein was obtained and placed in permanent medical record. Sterile ultrasound probe cover and gel utilized throughout the procedure. Utilizing continuous ultrasound guidance, the left common femoral vein was  accessed at the level of the femoral head with a 21 gauge needle. 21 gauge needle exchanged for a transitional dilator set over 0.018  inch guidewire. Transitional dilator set exchanged for 9 French sheath over 0.035 inch guidewire. Kumpe catheter advanced to the left external iliac vein. 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 Intravascular ultrasound evaluation of this region confirmed severe focal stenosis in the superior segment of the left common iliac vein. Intravascular ultrasound was utilized to determine normal diameter of inferior vena cava and common iliac vein distal and proximal to the stenosed segment. Ultrasound image documenting patency of the right internal jugular vein was obtained and placed in permanent medical record. Sterile ultrasound probe cover and gel utilized throughout the procedure. Utilizing continuous ultrasound guidance, the right internal jugular vein was accessed a second time with a 21 gauge needle. 21 gauge needle exchanged for a transitional dilator set over 0.018 inch guidewire. Transitional dilator set exchanged for 9 French sheath over 0.035 inch guidewire. Kumpe catheter and guidewire were advanced across the left common iliac vein. Contrast administered through the Kumpe the catheter at the level of the left external iliac vein confirmed appropriate intraluminal positioning. The stenosed segments of the common iliac veins bilaterally were dilated with 10 mm balloon. Bilateral 14 mm x 150 mm Abre stents were simultaneously deployed across the stenosed common iliac veins. The stents were gently simultaneously post dilated with 14 mm balloons. Bilateral lower extremity venogram was performed after post dilation of the stents, which showed excellent unobstructed flow to the inferior vena cava. No significant narrowing of the IVC was identified. Both  stented segments were again evaluated with intravascular ultrasound. There was mild narrowing of the right and moderate narrowing of the left stent within the inferior vena cava, however given brisk flow through the uncovered stents, no additional venoplasty was performed. Right IJ and left CFV accesses were removed and hemostasis achieved with manual compression. 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 Electronically Signed   By: Miachel Roux M.D.   On: 09/25/2022 10:49   IR TRANSCATH PLC STENT  INITIAL VEIN  INC ANGIOPLASTY  Result Date: 09/25/2022 INDICATION: 67 year old woman with severe right lower extremity pain and swelling. CT of the abdomen and pelvis shows severe narrowing of both common iliac arteries. No right lower extremity DVT identified on Doppler examination. IR consulted for bilateral lower extremity venogram with possible venoplasty/stent placement EXAM: 1. Ultrasound guided access of right internal jugular vein (x2) 2. Ultrasound-guided access of left common femoral vein 3. Bilateral lower extremity venogram 4. Inferior vena cavogram 5. Bilateral lower extremity angioplasty and stent placement 6. IVUS evaluation of bilateral external and common iliac veins and IVC COMPARISON:  None Available. MEDICATIONS: None. ANESTHESIA/SEDATION: MAC sedation performed and monitored by the anesthesia team. FLUOROSCOPY: Radiation Exposure Index (as provided by the fluoroscopic device): 123456 mGy Kerma COMPLICATIONS: None immediate. FIRST ASSISTANT: Dr. Ruthann Cancer TECHNIQUE: Informed written consent was obtained from the patient after a thorough discussion of the procedural risks, benefits and alternatives. All questions were addressed. Maximal Sterile Barrier Technique was utilized including caps, mask, sterile gowns, sterile gloves, sterile drape,  hand hygiene and skin antiseptic. A timeout was performed prior to the initiation of the procedure. Right neck and left groin skin prepped and draped in a sterile fashion. Ultrasound image documenting patency of the right internal jugular vein was obtained and placed in permanent medical  record. Sterile ultrasound probe cover and gel utilized throughout the procedure. Utilizing continuous ultrasound guidance, the right internal jugular vein was accessed with a 21 gauge needle. 21 gauge needle exchanged for a transitional dilator set over 0.018 inch guidewire. Transitional dilator set exchanged for 9 French sheath over 0.035 inch guidewire. Kumpe catheter advanced to the right external iliac vein. 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 The severe focal stenosis was confirmed with intravascular ultrasound. Intravascular ultrasound was also utilized to measure the diameter of normal vein proximal and distal to the site of stenosis. Ultrasound image documenting patency of the left common femoral vein was obtained and placed in permanent medical record. Sterile ultrasound probe cover and gel utilized throughout the procedure. Utilizing continuous ultrasound guidance, the left common femoral vein was accessed at the level of the femoral head with a 21 gauge needle. 21 gauge needle exchanged for a transitional dilator set over 0.018 inch guidewire. Transitional dilator set exchanged for 9 French sheath over 0.035 inch guidewire. Kumpe catheter advanced to the left external iliac vein. 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 Intravascular ultrasound evaluation of this region confirmed severe focal stenosis in the superior segment of the left common iliac vein. Intravascular ultrasound was  utilized to determine normal diameter of inferior vena cava and common iliac vein distal and proximal to the stenosed segment. Ultrasound image documenting patency of the right internal jugular vein was obtained and placed in permanent medical record. Sterile ultrasound probe cover and gel utilized throughout the procedure. Utilizing continuous ultrasound guidance, the right internal jugular vein was accessed a second time with a 21 gauge needle. 21 gauge needle exchanged for a transitional dilator set over 0.018 inch guidewire. Transitional dilator set exchanged for 9 French sheath over 0.035 inch guidewire. Kumpe catheter and guidewire were advanced across the left common iliac vein. Contrast administered through the Kumpe the catheter at the level of the left external iliac vein confirmed appropriate intraluminal positioning. The stenosed segments of the common iliac veins bilaterally were dilated with 10 mm balloon. Bilateral 14 mm x 150 mm Abre stents were simultaneously deployed across the stenosed common iliac veins. The stents were gently simultaneously post dilated with 14 mm balloons. Bilateral lower extremity venogram was performed after post dilation of the stents, which showed excellent unobstructed flow to the inferior vena cava. No significant narrowing of the IVC was identified. Both stented segments were again evaluated with intravascular ultrasound. There was mild narrowing of the right and moderate narrowing of the left stent within the inferior vena cava, however given brisk flow through the uncovered stents, no additional venoplasty was performed. Right IJ and left CFV accesses were removed and hemostasis achieved with manual compression. 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 Electronically Signed   By: Miachel Roux M.D.   On: 09/25/2022  10:49   IR Veno/Ext/Bi  Result Date: 09/23/2022 INDICATION: 67 year old female presents for venogram, intravascular ultrasound, possible intervention for suspected right iliac vein occlusion and symptomatic swelling of the right lower extremity. EXAM: LOWER EXTREMITY VENOGRAM DISCONTINUATION OF LOWER EXTREMITY VENOGRAM COMPARISON:  CT 09/18/2022 MEDICATIONS: NONE ANESTHESIA/SEDATION: Moderate (conscious) sedation was employed during this procedure. A  total of Versed 1.0 mg and DILAUDID 1 MG, 50 mcg fentanyl was administered intravenously by the radiology nurse. Total intra-service moderate Sedation Time: 15 minutes. The patient's level of consciousness and vital signs were monitored continuously by radiology nursing throughout the procedure under my direct supervision. FLUOROSCOPY: None COMPLICATIONS: None TECHNIQUE: Informed written consent was obtained from the patient after a thorough discussion of the procedural risks, benefits and alternatives. All questions were addressed. Maximal Sterile Barrier Technique was utilized including caps, mask, sterile gowns, sterile gloves, sterile drape, hand hygiene and skin antiseptic. A timeout was performed prior to the initiation of the procedure. Initiation of conscious sedation was performed in order to attempt to have the patient become comfortable under the image intensifier. After medications it was clear that the patient would not be comfortable nor safe on the table. Procedure was aborted. FINDINGS: None IMPRESSION: Attempt at lower extremity venogram and possible treatment was aborted, as the patient could not be made comfortable with moderate sedation. Signed, Dulcy Fanny. Nadene Rubins, RPVI [facility] Radiology Electronically Signed   By: Corrie Mckusick D.O.   On: 09/23/2022 11:55   VAS [place] LOWER EXTREMITY VENOUS (DVT)  Result Date: 09/19/2022  Lower Venous DVT Study Patient Name:  Conchita ANN Spatz  Date of  Exam:   09/18/2022 Medical Rec #: [id number]            Accession #:    [id number] Date of Birth: 10/27/1955           Patient Gender: F Patient Age:   74 years Exam Location:  [facility] Procedure:      VAS [place] LOWER EXTREMITY VENOUS (DVT) Referring Phys: DAVID ORTIZ --------------------------------------------------------------------------------  Indications: Edema.  Risk Factors: Cancer. Limitations: Body habitus, poor ultrasound/tissue interface, bandages and patient positioning, patient pain tolerance, patient movement. Comparison Study: No prior studies. Performing Technologist: Oliver Hum RVT  Examination Guidelines: A complete evaluation includes B-mode imaging, spectral Doppler, color Doppler, and power Doppler as needed of all accessible portions of each vessel. Bilateral testing is considered an integral part of a complete examination. Limited examinations for reoccurring indications may be performed as noted. The reflux portion of the exam is performed with the patient in reverse Trendelenburg.  +---------+---------------+---------+-----------+----------+-------------------+ RIGHT    CompressibilityPhasicitySpontaneityPropertiesThrombus Aging      +---------+---------------+---------+-----------+----------+-------------------+ CFV                     Yes      Yes                                      +---------+---------------+---------+-----------+----------+-------------------+ FV Prox                 Yes      Yes                                      +---------+---------------+---------+-----------+----------+-------------------+ FV Mid                  Yes      Yes                                      +---------+---------------+---------+-----------+----------+-------------------+ FV Distal  Yes      Yes                                      +---------+---------------+---------+-----------+----------+-------------------+ POP      Full            Yes      Yes                                      +---------+---------------+---------+-----------+----------+-------------------+ PTV      Full                                                             +---------+---------------+---------+-----------+----------+-------------------+ PERO                                                  Not well visualized +---------+---------------+---------+-----------+----------+-------------------+   +---------+---------------+---------+-----------+----------+--------------+ LEFT     CompressibilityPhasicitySpontaneityPropertiesThrombus Aging +---------+---------------+---------+-----------+----------+--------------+ CFV      Full           Yes      Yes                                 +---------+---------------+---------+-----------+----------+--------------+ SFJ      Full                                                        +---------+---------------+---------+-----------+----------+--------------+ FV Prox  Full                                                        +---------+---------------+---------+-----------+----------+--------------+ FV Mid   Full                                                        +---------+---------------+---------+-----------+----------+--------------+ FV DistalFull                                                        +---------+---------------+---------+-----------+----------+--------------+ PFV      Full                                                        +---------+---------------+---------+-----------+----------+--------------+  POP      Full           Yes      Yes                                 +---------+---------------+---------+-----------+----------+--------------+ PTV      Full                                                        +---------+---------------+---------+-----------+----------+--------------+ PERO     Full                                                         +---------+---------------+---------+-----------+----------+--------------+     Summary: RIGHT: - There is no evidence of deep vein thrombosis in the lower extremity. However, portions of this examination were limited- see technologist comments above.  - No cystic structure found in the popliteal fossa.  LEFT: - There is no evidence of deep vein thrombosis in the lower extremity.  - No cystic structure found in the popliteal fossa.  *See table(s) above for measurements and observations. Electronically signed by Jamelle Haring on 09/19/2022 at 2:52:33 PM.    Final    CT ABDOMEN PELVIS W CONTRAST  Result Date: 09/18/2022 CLINICAL DATA:  Metastatic disease evaluation.  Breast cancer. EXAM: CT ABDOMEN AND PELVIS WITH CONTRAST TECHNIQUE: Multidetector CT imaging of the abdomen and pelvis was performed using the standard protocol following bolus administration of intravenous contrast. RADIATION DOSE REDUCTION: This exam was performed according to the departmental dose-optimization program which includes automated exposure control, adjustment of the mA and/or kV according to patient size and/or use of iterative reconstruction technique. CONTRAST:  64m OMNIPAQUE IOHEXOL 300 MG/ML  SOLN COMPARISON:  CT of the chest abdomen pelvis dated 08/13/2022. FINDINGS: Lower chest: Minimal right lung base linear atelectasis/scarring. No intra-abdominal free air.  Small free fluid within the pelvis. Hepatobiliary: The liver is unremarkable. No biliary dilatation there is stone within the gallbladder. No pericholecystic fluid or evidence of acute cholecystitis by CT. Pancreas: Unremarkable. No pancreatic ductal dilatation or surrounding inflammatory changes. Spleen: Normal in size without focal abnormality. Adrenals/Urinary Tract: The adrenal glands are unremarkable. Interval development of mild right hydronephrosis and mild right hydroureter. No obstructing stone identified. Findings likely related to  compression of the distal right ureter within the pelvis. There is no hydronephrosis or nephrolithiasis on the left. The urinary bladder is mildly distended. There is focal area of thickened appearance of the right posterior bladder dome which is suboptimally evaluated. A bladder lesion is not excluded further evaluation with cystoscopy is recommended. There is diffuse bladder wall haziness and edema. Correlation with urinalysis recommended to exclude cystitis. Stomach/Bowel: There is no bowel obstruction or active inflammation. The appendix is normal. Vascular/Lymphatic: Mild aortoiliac atherosclerotic disease. The IVC is unremarkable. There is retroperitoneal adenopathy similar to prior CT. No portal venous gas. Reproductive: Enlarged myomatous uterus. There is fluid distended endometrium concerning for obstruction or stricture of the endocervical canal. Other: There is extensive edema of the right lower extremity likely related to compression of the right iliac veins within the  pelvis. This has significantly progressed since the prior CT. Musculoskeletal: Similar appearance of treated right iliac treated metastatic disease. No acute osseous pathology. A 3 x 4 cm fluid density with fat fluid level in the left breast similar to prior CT, likely a postsurgical or posttreatment changes. IMPRESSION: 1. Interval development of mild right hydronephrosis likely related to compression of the distal right ureter within the pelvis. 2. Extensive edema in the right lower extremity likely related to compression of the right iliac veins in the pelvis. 3. Focal area of thickened appearance of the right posterior bladder. Further evaluation with cystoscopy is recommended. 4. Enlarged myomatous uterus with fluid distended endometrium concerning for obstruction or stricture of the endocervical canal. 5. Retroperitoneal adenopathy similar to prior CT. 6. Cholelithiasis. 7. Similar appearance of treated right iliac metastatic  disease. 8.  Aortic Atherosclerosis (ICD10-I70.0). Electronically Signed   By: Anner Crete M.D.   On: 09/18/2022 23:24   (Echo, Carotid, EGD, Colonoscopy, ERCP)    Subjective: Patient seen in the morning rounds.  Pain is controlled.  Eager to go home.  Right leg remains about the same but she feels it is less tighter than before.   Discharge Exam: Vitals:   09/25/22 2033 09/26/22 0228  BP: 107/75 95/67  Pulse: 98 88  Resp: 18 18  Temp: (!) 97.5 F (36.4 C) (!) 97.5 F (36.4 C)  SpO2: 96% 96%   Vitals:   09/25/22 0403 09/25/22 1048 09/25/22 2033 09/26/22 0228  BP: 113/76 104/74 107/75 95/67  Pulse: (!) 123 (!) 106 98 88  Resp: (!) 22 18 18 18  $ Temp: (!) 97.5 F (36.4 C) 97.6 F (36.4 C) (!) 97.5 F (36.4 C) (!) 97.5 F (36.4 C)  TempSrc: Oral Oral Oral Oral  SpO2:  95% 96% 96%  Weight:      Height:        General: Pt is alert, awake, not in acute distress Cardiovascular: RRR, S1/S2 +, no rubs, no gallops Port-A-Cath Respiratory: CTA bilaterally, no wheezing, no rhonchi Abdominal: Soft, NT, ND, bowel sounds + Extremities: Diffusely edematous, nonpitting edema from right thigh to the leg.    The results of significant diagnostics from this hospitalization (including imaging, microbiology, ancillary and laboratory) are listed below for reference.     Microbiology: Recent Results (from the past 240 hour(s))  Surgical pcr screen     Status: Abnormal   Collection Time: 09/24/22  4:37 PM   Specimen: Nasal Mucosa; Nasal Swab  Result Value Ref Range Status   MRSA, PCR NEGATIVE NEGATIVE Final   Staphylococcus aureus POSITIVE (A) NEGATIVE Final    Comment: (NOTE) The Xpert SA Assay (FDA approved for NASAL specimens in patients 22 years of age and older), is one component of a comprehensive surveillance program. It is not intended to diagnose infection nor to guide or monitor treatment. Performed at [facility], [place] [address] [place], [place] [address]       Labs: BNP (last 3 results) No results for input(s): "BNP" in the last 8760 hours. Basic Metabolic Panel: Recent Labs  Lab 09/20/22 0639 09/23/22 0237 09/24/22 0609 09/25/22 0653  NA 135 133* 132* 132*  K 4.2 4.3 4.5 4.5  CL 107 105 105 106  CO2 22 20* 21* 20*  GLUCOSE 82 96 99 96  BUN 14 15 15 13  $ CREATININE 1.17* 1.29* 1.36* 1.27*  CALCIUM 8.3* 8.4* 8.4* 8.4*   Liver Function Tests: No results for input(s): "AST", "ALT", "ALKPHOS", "BILITOT", "PROT", "  ALBUMIN" in the last 168 hours. No results for input(s): "LIPASE", "AMYLASE" in the last 168 hours. No results for input(s): "AMMONIA" in the last 168 hours. CBC: Recent Labs  Lab 09/20/22 0639 09/23/22 0237 09/24/22 0609 09/25/22 0653  WBC 3.6* 5.0 5.3 6.5  NEUTROABS  --  3.4  --   --   HGB 9.4* 10.6* 10.8* 11.3*  HCT 29.0* 31.4* 32.5* 33.4*  MCV 93.2 91.0 90.8 90.3  PLT 169 342 459* 543*   Cardiac Enzymes: No results for input(s): "CKTOTAL", "CKMB", "CKMBINDEX", "TROPONINI" in the last 168 hours. BNP: Invalid input(s): "POCBNP" CBG: Recent Labs  Lab 09/24/22 1800  GLUCAP 109*   D-Dimer No results for input(s): "DDIMER" in the last 72 hours. Hgb A1c No results for input(s): "HGBA1C" in the last 72 hours. Lipid Profile No results for input(s): "CHOL", "HDL", "LDLCALC", "TRIG", "CHOLHDL", "LDLDIRECT" in the last 72 hours. Thyroid function studies No results for input(s): "TSH", "T4TOTAL", "T3FREE", "THYROIDAB" in the last 72 hours.  Invalid input(s): "FREET3" Anemia work up No results for input(s): "VITAMINB12", "FOLATE", "FERRITIN", "TIBC", "IRON", "RETICCTPCT" in the last 72 hours. Urinalysis    Component Value Date/Time   COLORURINE AMBER (A) 03/31/2022 1831   APPEARANCEUR HAZY (A) 03/31/2022 1831   LABSPEC 1.018 03/31/2022 1831   LABSPEC 1.030 04/05/2020 1009   PHURINE 5.0 03/31/2022 1831   GLUCOSEU NEGATIVE 03/31/2022 1831   HGBUR NEGATIVE 03/31/2022 1831   BILIRUBINUR NEGATIVE 03/31/2022  1831   BILIRUBINUR negative 04/05/2020 1009   KETONESUR NEGATIVE 03/31/2022 1831   PROTEINUR NEGATIVE 03/31/2022 1831   NITRITE NEGATIVE 03/31/2022 1831   LEUKOCYTESUR NEGATIVE 03/31/2022 1831   Sepsis Labs Recent Labs  Lab 09/20/22 0639 09/23/22 0237 09/24/22 0609 09/25/22 0653  WBC 3.6* 5.0 5.3 6.5   Microbiology Recent Results (from the past 240 hour(s))  Surgical pcr screen     Status: Abnormal   Collection Time: 09/24/22  4:37 PM   Specimen: Nasal Mucosa; Nasal Swab  Result Value Ref Range Status   MRSA, PCR NEGATIVE NEGATIVE Final   Staphylococcus aureus POSITIVE (A) NEGATIVE Final    Comment: (NOTE) The Xpert SA Assay (FDA approved for NASAL specimens in patients 58 years of age and older), is one component of a comprehensive surveillance program. It is not intended to diagnose infection nor to guide or monitor treatment. Performed at [facility], [place] [address] [place], [place] [address]      Time coordinating discharge:  35 minutes  SIGNED:   Barb Merino, MD  [facility] 09/26/2022, 11:24 AM

## 2022-09-26 NOTE — TOC Transition Note (Signed)
Transition of Care [facility]) - CM/SW Discharge Note   Patient Details  Name: Kristin Ward MRN: [id number] Date of Birth: 1956-01-13  [facility] CM/SW Contact:  Zenon Mayo, RN Phone Number: 09/26/2022, 10:35 AM   Clinical Narrative:    Patient is for dc today, she has no needs, only need port a cath to be deaccessed.    Final next level of care: Home/Self Care Barriers to Discharge: No Barriers Identified   Patient Goals and CMS Choice   Choice offered to / list presented to : NA  Discharge Placement                         Discharge Plan and Services Additional resources added to the After Visit Summary for   In-house Referral: NA Discharge Planning Services: CM Consult Post Acute Care Choice: NA          DME Arranged: N/A DME Agency: NA       HH Arranged: NA          Social Determinants of Health (SDOH) Interventions SDOH Screenings   Food Insecurity: No Food Insecurity (09/18/2022)  Housing: Low Risk  (09/18/2022)  Transportation Needs: No Transportation Needs (09/18/2022)  Utilities: Not At Risk (09/18/2022)  Depression (PHQ2-9): Low Risk  (08/09/2022)  Financial Resource Strain: Low Risk  (08/09/2022)  Physical Activity: Sufficiently Active (08/09/2022)  Social Connections: Unknown (09/01/2020)  Stress: No Stress Concern Present (08/09/2022)  Tobacco Use: Medium Risk (09/26/2022)     Readmission Risk Interventions    09/26/2022   10:29 AM  Readmission Risk Prevention Plan  Transportation Screening Complete  PCP or Specialist Appt within 3-5 Days Complete  HRI or [place] Complete  Social Work Consult for [place] Planning/Counseling Complete  Palliative Care Screening Not Applicable  Medication Review Press photographer) Complete

## 2022-09-27 ENCOUNTER — Telehealth: Payer: Self-pay

## 2022-09-27 ENCOUNTER — Other Ambulatory Visit: Payer: Self-pay | Admitting: Internal Medicine

## 2022-09-27 DIAGNOSIS — M25551 Pain in right hip: Secondary | ICD-10-CM

## 2022-09-27 MED ORDER — MORPHINE SULFATE ER 15 MG PO TBCR: 15.0000 mg | EXTENDED_RELEASE_TABLET | Freq: Two times a day (BID) | ORAL | 0 refills | Status: DC

## 2022-09-27 MED ORDER — METHOCARBAMOL 500 MG PO TABS: 500.0000 mg | ORAL_TABLET | Freq: Three times a day (TID) | ORAL | 0 refills | Status: AC | PRN

## 2022-09-27 NOTE — Transitions of Care (Post Inpatient/ED Visit) (Signed)
   09/27/2022  Name: Cayleigh Flagler MRN: [id number] DOB: [birth year]  Today's TOC FU Call Status: Today's TOC FU Call Status:: Unsuccessul Call (1st Attempt) Unsuccessful Call (1st Attempt) Date: 09/27/22  Attempted to reach the patient regarding the most recent Inpatient/ED visit.  Follow Up Plan: Additional outreach attempts will be made to reach the patient to complete the Transitions of Care (Post Inpatient/ED visit) call.     Enzo Montgomery, RN,BSN,CCM [place] Management Telephonic Care Management Coordinator Direct Phone: [phone] Toll Free: [phone] Fax: [phone]

## 2022-09-30 ENCOUNTER — Ambulatory Visit: Payer: 59 | Admitting: Rehabilitation

## 2022-09-30 ENCOUNTER — Telehealth: Payer: Self-pay

## 2022-09-30 NOTE — Assessment & Plan Note (Deleted)
-  S/p C2D8 Taxol she developed mild tingling in her hands and feet. Taxol stopped after 12/18/20.  -worsening with chemo

## 2022-09-30 NOTE — Transitions of Care (Post Inpatient/ED Visit) (Signed)
   09/30/2022  Name: [name] MRN: [id number] DOB: 1956/06/27  Today's TOC FU Call Status: Today's TOC FU Call Status:: Unsuccessful Call (3rd Attempt) Unsuccessful Call (3rd Attempt) Date: 09/30/22  Attempted to reach the patient regarding the most recent Inpatient/ED visit.  Follow Up Plan: No further outreach attempts will be made at this time. We have been unable to contact the patient.    Enzo Montgomery, RN,BSN,CCM [place] Management Telephonic Care Management Coordinator Direct Phone: [phone] Toll Free: [phone] Fax: [phone]

## 2022-09-30 NOTE — Progress Notes (Deleted)
[place]   [phone]) [phone] [phone]   Clinic Follow up Note   Patient Care Team: [name], MD as PCP - General (Family Medicine) [name], MD as PCP - Cardiology (Cardiology) [name], [name], MD as PCP - Advanced Heart Failure (Cardiology) [name], MD as Consulting Physician (Hematology) [name], MD as Consulting Physician (General Surgery) [name], [facility] (Inactive) as Pharmacist (Pharmacist) Jacelyn Pi, MD as Referring Physician (Endocrinology)  Date of Service:  09/30/2022  CHIEF COMPLAINT: f/u of metastatic breast cancer   CURRENT THERAPY:  Carbolpatin,+Gemcitabine q21d , Pembrolizumab q21    ASSESSMENT: *** Kristin Ward is a 67 y.o. female with   No problem-specific Assessment & Plan notes found for this encounter.  ***   PLAN:    SUMMARY OF ONCOLOGIC HISTORY: Oncology History Overview Note  Cancer Staging Malignant neoplasm of lower-outer quadrant of left breast of female, estrogen receptor negative ([place]) Staging form: Breast, AJCC 8th Edition - Clinical stage from 06/06/2017: Stage IB (cT1c, cN0, cM0, G3, ER-, PR-, HER2-) - Signed by [name], MD on 06/15/2017 Nuclear grade: G3 Histologic grading system: 3 grade system - Pathologic stage from 06/26/2017: Stage IB (pT1c, pN0, cM0, G3, ER-, PR-, HER2-) - Signed by [name], MD on 07/10/2017 Neoadjuvant therapy: No Nuclear grade: G3 Multigene prognostic tests performed: None Histologic grading system: 3 grade system Laterality: Left     Malignant neoplasm of lower-outer quadrant of left breast of female, estrogen receptor negative ([place])  05/30/2017 Mammogram   Diagnostic mammo and US IMPRESSION: 1. Highly suspicious 1.4 cm mass in the slightly lower slightly outer left breast -tissue sampling recommended. 2. Indeterminate 0.5 mm mass in the slightly lower slightly outer left breast -tissue sampling recommended. 3. At least  2 left axillary lymph nodes with borderline cortical thickness.   06/06/2017 Initial Biopsy   Diagnosis 1. Breast, left, needle core biopsy, 5:30 o'clock - INVASIVE DUCTAL CARCINOMA, G3 2. Lymph node, needle/core biopsy, left axillary - NO CARCINOMA IDENTIFIED IN ONE LYMPH NODE (0/1)   06/06/2017 Initial Diagnosis   Malignant neoplasm of lower-outer quadrant of left breast of female, estrogen receptor negative ([place])   06/06/2017 Receptors her2   Estrogen Receptor: 0%, NEGATIVE Progesterone Receptor: 0%, NEGATIVE Proliferation Marker Ki67: 70%   06/26/2017 Surgery   LEFT BREAST LUMPECTOMY WITH RADIOACTIVE SEED AND SENTINEL LYMPH NODE BIOPSY ERAS  PATHWAY AND INSERTION PORT-A-CATH By Dr. Barry Dienes on 06/26/17    06/26/2017 Pathology Results   Diagnosis 06/26/17  1. Breast, lumpectomy, Left - INVASIVE DUCTAL CARCINOMA, GRADE III/III, SPANNING 1.2 CM. - THE SURGICAL RESECTION MARGINS ARE NEGATIVE FOR CARCINOMA. - SEE ONCOLOGY TABLE BELOW. 2. Lymph node, sentinel, biopsy, Left axillary #1 - THERE IS NO EVIDENCE OF CARCINOMA IN 1 OF 1 LYMPH NODE (0/1). 3. Lymph node, sentinel, biopsy, Left axillary #2 - THERE IS NO EVIDENCE OF CARCINOMA IN 1 OF 1 LYMPH NODE (0/1). 4. Lymph node, sentinel, biopsy, Left axillary #3 - THERE IS NO EVIDENCE OF CARCINOMA IN 1 OF 1 LYMPH NODE (0/1).    07/25/2017 - 09/29/2017 Chemotherapy   Adjuvant cytoxan and docetaxel (TC) every 3 weekd for 4 cycles     11/19/2017 - 12/17/2017 Radiation Therapy   Adjuvant breast radiation Left breast treated to 42.5 Gy with 17 fx of 2.5 Gy followed by a boost of 7.5 Gy with 3 fx of 2.5 Gy      02/2019 Procedure   She had PAC removal in 02/2019.  07/23/2020 Imaging   MRI Lumbar Spine 07/23/20 IMPRESSION: 1. No significant disc herniation, spinal canal or neural foraminal stenosis at any level. 2. Moderate facet degenerative changes at L3-4, L4-5 and L5-S1. 3. Multiple enlarged retroperitoneal and right iliac  lymph nodes. Recommend correlation with CT of the abdomen and pelvis with contrast.   08/15/2020 Imaging   CT AP 08/15/20  IMPRESSION: 1. Right iliac and periaortic adenopathy is noted concerning for metastatic disease or lymphoma. Also noted is abnormal soft tissue mass anterior and posterior to the right iliac wing concerning for malignancy or metastatic disease. MRI is recommended for further evaluation. 2. Irregular lucency with sclerotic margins is seen involving the right iliac crest concerning for possible lytic lesion. MRI may be performed for further evaluation. 3. Enlarged fibroid uterus. 4. Small gallstone. 5. Aortic atherosclerosis.   08/22/2020 Pathology Results   FINAL MICROSCOPIC DIAGNOSIS: 08/22/20  A. NEEDLE CORE, RIGHT GLUTEUS MINIMUMUS, BIOPSY:  -  Metastatic carcinoma  -  See comment   COMMENT:  By immunohistochemistry, the neoplastic cells are positive for  cytokeratin-7 and have weak patchy positivity for GATA3 but are negative for cytokeratin-20 and GCDFP.  The combined morphology and immunophenotype, support metastasis from the patient's known breast primary carcinoma.   ADDENDUM:  PROGNOSTIC INDICATOR RESULTS:  The tumor cells are NEGATIVE for Her2 (0).  Estrogen Receptor:       POSITIVE, 20%, WEAK STAINING  Progesterone Receptor:   NEGATIVE   PD-L1 (0) negative    09/04/2020 - 09/22/2020 Radiation Therapy   Palliative radiation to right iliac 09/04/20 - 09/22/20, Dr. Lisbeth Renshaw   09/26/2020 -  Chemotherapy   first line weekly Taxol 3 weeks on/1 week off starting 09/26/20 --Reduced to 2 weeks on/1 week off starting with C3 due to neuropathy. Stopped after C4 on 12/18/20 due to neuratrophy.  --Switched to Xeloda on 01/01/21 at 1594m in the AM and 20062min the PM for 2 weeks on/1 week off     10/13/2020 Procedure    PAC placement on 10/13/20.    04/10/2021 Imaging   CT CAP  IMPRESSION: 1. Enlarging abdominopelvic retroperitoneal and right inguinal adenopathy,  compatible with disease progression. 2. Lytic metastasis in the right iliac wing, similar. 3. Hepatomegaly. 4. Cholelithiasis. 5. Enlarged fibroid uterus. 6.  Aortic atherosclerosis (ICD10-I70.0).   06/18/2021 Imaging   CT CAP  IMPRESSION: 1. Today's study demonstrates progression of metastatic disease as evidence by increased number and size of numerous enlarged lymph nodes in the retroperitoneum, along the right pelvic sidewall, and in the upper right thigh. 2. Osseous metastases in the bony pelvis appear similar to the prior examination. No definite new osseous lesions are otherwise noted. 3. Fibroid uterus again noted. 4. Cholelithiasis without evidence of acute cholecystitis. 5. Aortic atherosclerosis, in addition to left anterior descending coronary artery disease. Please note that although the presence of coronary artery calcium documents the presence of coronary artery disease, the severity of this disease and any potential stenosis cannot be assessed on this non-gated CT examination. Assessment for potential risk factor modification, dietary therapy or pharmacologic therapy may be warranted, if clinically indicated. 6. Additional incidental findings, as above.   06/20/2021 Imaging   Bone Scan  IMPRESSION: No osseous uptake suspicious for recurrent/progressive metastatic disease.   07/03/2021 - 01/11/2022 Chemotherapy   Patient is on Treatment Plan : BREAST METASTATIC Sacituzumab govitecan-hziy (TIvette Loyalq21d     09/27/2021 Imaging   EXAM: CT CHEST, ABDOMEN, AND PELVIS WITH CONTRAST  IMPRESSION: 1. Marked interval improvement  in previously demonstrated lymphadenopathy in the abdomen and pelvis. A few prominent right inguinal lymph nodes remain. 2. Stable osseous metastatic disease in the pelvis. 3. No evidence of local recurrence or other metastatic disease. 4. Stable incidental findings including cholelithiasis, uterine fibroids and Aortic Atherosclerosis  (ICD10-I70.0).  ADDENDUM: Not mentioned in the original impression is a possible nonocclusive fibrin sheath along the distal aspect of the Port-A-Cath.   09/27/2021 Imaging   EXAM: NUCLEAR MEDICINE WHOLE BODY BONE SCAN  IMPRESSION: Stable examination, without abnormal osseous uptake.   11/27/2021 Imaging   CT AP IMPRESSION: 1. Stable lytic and sclerotic lesion in the RIGHT iliac wing. 2. No evidence of visceral metastasis or adenopathy in the abdomenpelvis. 3. Leiomyomatous uterus.  ADDENDUM REPORT: 11/29/2021 09:33 Upon further review and discussion with Cira Rue NP, the RIGHT inguinal lymph nodes previous described have increased in volume. For example 14 mm node (image 91/2) is increased from 9 mm. Adjacent 14 mm node on image 86 is increased from 8 mm. Additionally, LEFT common iliac lymph node is also slightly increased measuring 10 mm (46/2) compared to 7 mm on comparison exam from 09/27/2021. These sites demonstrates similar but greater lymphadenopathy on CT 06/18/2021.   02/15/2022 - 03/08/2022 Chemotherapy   Patient is on Treatment Plan : BREAST METASTATIC fam-trastuzumab deruxtecan-nxki (Enhertu) q21d     02/15/2022 - 05/10/2022 Chemotherapy   Patient is on Treatment Plan : BREAST METASTATIC Fam-Trastuzumab Deruxtecan-nxki (Enhertu) (5.4) q21d     06/07/2022 - 07/05/2022 Chemotherapy   Patient is on Treatment Plan : BREAST METASTATIC Eribulin D1,8 q21d     07/26/2022 - 07/26/2022 Chemotherapy   Patient is on Treatment Plan : HEAD/NECK Pembrolizumab (200) q21d     09/11/2022 - 09/11/2022 Chemotherapy   Patient is on Treatment Plan : HEAD/NECK Pembrolizumab (200) q21d     09/11/2022 - 09/11/2022 Chemotherapy   Patient is on Treatment Plan : BREAST Carboplatin + Gemcitabine D1,8 q21d     09/19/2022 -  Chemotherapy   Patient is on Treatment Plan : BREAST Pembrolizumab (200) D1 + Carboplatin (2) D1,8 + Gemcitabine (1000) D1,8 q21d     Neuropathy associated with cancer ([place])   11/21/2021 Initial Diagnosis   Neuropathy associated with cancer ([place])      INTERVAL HISTORY: *** Kristin Ward is here for a follow up of metastatic breast cancer  She was last seen by me on 09/18/2022 She presents to the clinic     All other systems were reviewed with the patient and are negative.  MEDICAL HISTORY:  Past Medical History:  Diagnosis Date   Abnormal x-ray of lungs with single pulmonary nodule 03/15/2016   per records from [place]    Anemia    Asthma    Cholelithiasis 05/19/2017   On CT   Coronary artery calcification seen on CAT scan 05/19/2017   Diabetes mellitus without complication ([place])    type 2   Diabetes mellitus, new onset ([place]) 01/20/2019   Dilated idiopathic cardiomyopathy ([place]) 12/2015   EF 15-20%. Diagnosed in [facility]   Headache    NONE RECENT   Hypertension    Lymph nodes enlarged 07/25/2020   Malignant neoplasm of lower-outer quadrant of left breast of female, estrogen receptor negative ([place]) 06/11/2017   left breast   Mixed hyperlipidemia 06/17/2018   partially blind    Personal history of chemotherapy 2018-2019   Personal history of radiation therapy 2019   Retinitis pigmentosa    Retroperitoneal lymphadenopathy 07/25/2020   Uveitis  SURGICAL HISTORY: Past Surgical History:  Procedure Laterality Date   brain cyst removed  2007   to help with headaches per patient , aspirated    BREAST BIOPSY Left 06/06/2017   x2   BREAST CYST ASPIRATION Left 06/06/2017   BREAST LUMPECTOMY Left 06/26/2017   BREAST LUMPECTOMY WITH RADIOACTIVE SEED AND SENTINEL LYMPH NODE BIOPSY Left 06/26/2017   Procedure: BREAST LUMPECTOMY WITH RADIOACTIVE SEED AND SENTINEL LYMPH NODE BIOPSY ERAS  PATHWAY;  Surgeon: [name], MD;  Location: [place];  Service: General;  Laterality: Left;  pec block   FINE NEEDLE ASPIRATION Left 02/23/2019   Procedure: FINE NEEDLE ASPIRATION;  Surgeon: [name], MD;  Location: [place];  Service: General;  Laterality: Left;  Asiration of left breast seroma    IR IMAGING GUIDED PORT INSERTION  10/13/2020   IR IVUS EACH ADDITIONAL NON CORONARY VESSEL  09/24/2022   IR IVUS EACH ADDITIONAL NON CORONARY VESSEL  09/24/2022   IR TRANSCATH PLC STENT  EA ADD VEIN  INC ANGIOPLASTY  09/25/2022   IR TRANSCATH PLC STENT  INITIAL VEIN  INC ANGIOPLASTY  09/24/2022   IR US GUIDE VASC ACCESS RIGHT  09/24/2022   IR VENO/EXT/BI  09/23/2022   IR VENO/EXT/UNI LEFT  09/24/2022   IR VENO/EXT/UNI RIGHT  09/24/2022   IR VENOCAVAGRAM IVC  09/25/2022   NASAL TURBINATE REDUCTION     PORT-A-CATH REMOVAL Right 02/23/2019   Procedure: REMOVAL PORT-A-CATH;  Surgeon: [name], MD;  Location: [place];  Service: General;  Laterality: Right;   PORTACATH PLACEMENT N/A 06/26/2017   Procedure: INSERTION PORT-A-CATH;  Surgeon: [name], MD;  Location: [place];  Service: General;  Laterality: N/A;   RADIOLOGY WITH ANESTHESIA Right 09/24/2022   Procedure: ARTERIOGRAM RIGHT LEG;  Surgeon: Radiologist, Medication, MD;  Location: [place];  Service: Radiology;  Laterality: Right;   RIGHT/LEFT HEART CATH AND CORONARY ANGIOGRAPHY N/A 07/31/2022   Procedure: RIGHT/LEFT HEART CATH AND CORONARY ANGIOGRAPHY;  Surgeon: Jolaine Artist, MD;  Location: [place] [facility];  Service: Cardiovascular;  Laterality: N/A;   TRANSTHORACIC ECHOCARDIOGRAM  12/2015   A) [facility] May 2017: Mild concentric LVH. Global hypokinesis. GR daily. EF 18% severe LA dilation. Mitral annular dilatation with papillary muscle dysfunction and moderate MR. Dilated IVC consistent with elevated RAP.   TRANSTHORACIC ECHOCARDIOGRAM  05/2017; 08/2017   a) Mild concentric LVH.  EF 45 to 50% with diffuse hypokinesis.  GR 1 DD.  Mild aortic root dilation.;; b)  Mild LVH EF 45 to 50%.  Diffuse HK.  No significant valve disease.  No significant change.   TRANSTHORACIC ECHOCARDIOGRAM  01/30/2022   Pre-Enhertu:EF 35 to 40%.   Moderate dysfunction with global HK.  Abnormal strain.  Normal RV size and function.  Normal RVSP.  Aortic root estimated 42 mm.  Mild MR.  Mild/trivial AI.  Normal RVP.;  04/01/2022: Preliminary read 40%.  Formal read 45 to 50% (probably 40 to 45%).  Global HK.  GR 1 DD.  Normal RV size and RVP.  Mild MR.  Now measured at 38 mm.  Normal RAP.    I have reviewed the social history and family history with the patient and they are unchanged from previous note.  ALLERGIES:  is allergic to morphine and related, latex, tylenol with codeine #3 [acetaminophen-codeine], and penicillins.  MEDICATIONS:  Current Outpatient Medications  Medication Sig Dispense Refill   Accu-Chek FastClix Lancets MISC TEST TWICE A DAY. PT USES AN ACCU-CHEK GUIDE  ME METER 102 each 2   Ascorbic Acid (VITAMIN C) 500 MG CAPS Take 500 mg by mouth daily.     aspirin 81 MG chewable tablet Chew 81 mg by mouth daily.     atorvastatin (LIPITOR) 40 MG tablet Take 1 tablet (40 mg total) by mouth daily. 90 tablet 3   brimonidine (ALPHAGAN) 0.2 % ophthalmic solution Place 1 drop into both eyes 2 (two) times daily.     carvedilol (COREG) 12.5 MG tablet TAKE 1 AND 1/2 TABLETS BY MOUTH TWICE DAILY qm (Patient taking differently: Take 18.75 mg by mouth 2 (two) times daily with a meal.) 90 tablet 10   Cholecalciferol (VITAMIN D) 50 MCG (2000 UT) CAPS Take 2,000 Units by mouth daily.     empagliflozin (JARDIANCE) 10 MG TABS tablet Take 1 tablet (10 mg total) by mouth daily before breakfast. 90 tablet 3   gabapentin (NEURONTIN) 300 MG capsule Take 3 caps twice a day during the day and 4 caps at bedtime (Patient taking differently: Take 600-900 mg by mouth See admin instructions. Take 2 caps ( 600 mg)  twice a day during the day and 3 caps ( 900 mg)  at bedtime) 300 capsule 1   ibuprofen (ADVIL) 800 MG tablet Take 1 tablet (800 mg total) by mouth every 8 (eight) hours as needed. (Patient taking differently: Take 800 mg by mouth every 8 (eight) hours  as needed for mild pain.) 30 tablet 2   Lancets Misc. (ACCU-CHEK SOFTCLIX LANCET DEV) KIT 1 Units by Does not apply route 2 (two) times daily. Check FSBS BID - Include strips # 50 with 5 refills, Lancets #50 with 5 refills DX: Type 2 DM - ICD 10: E11.9 1 kit 0   lidocaine-prilocaine (EMLA) cream Apply 1 application topically as needed. 30 g 0   linaclotide (LINZESS) 72 MCG capsule Take 1 capsule (72 mcg total) by mouth daily as needed (constipation). 30 capsule 2   magic mouthwash (nystatin, lidocaine, diphenhydrAMINE, alum & mag hydroxide) suspension Swish and swallow 5 mLs by mouth 4 (four) times daily as needed for mouth pain 140 mL 0   Magnesium 300 MG CAPS Take 300 mg by mouth 2 (two) times a week.     methocarbamol (ROBAXIN) 500 MG tablet Take 1 tablet (500 mg total) by mouth every 8 (eight) hours as needed for up to 14 days for muscle spasms. 42 tablet 0   morphine (MS CONTIN) 15 MG 12 hr tablet Take 1 tablet (15 mg total) by mouth every 12 (twelve) hours for 14 days. 28 tablet 0   Multiple Vitamins-Minerals (WOMENS MULTI PO) Take 15 mLs by mouth daily.     ondansetron (ZOFRAN) 8 MG tablet Take 1 tablet (8 mg total) by mouth every 8 (eight) hours as needed for nausea or vomiting (begin on day 3 after chemo). 30 tablet 1   oxyCODONE (OXYCONTIN) 15 mg 12 hr tablet Take 1 tablet (15 mg total) by mouth every 12 (twelve) hours for 7 days. 14 tablet 0   oxyCODONE 10 MG TABS Take 1 tablet (10 mg total) by mouth every 6 (six) hours as needed for up to 5 days for severe pain or moderate pain. 20 tablet 0   pantoprazole (PROTONIX) 40 MG tablet TAKE 1 TABLET BY MOUTH EVERY DAY (Patient taking differently: Take 40 mg by mouth daily as needed (heartburn).) 90 tablet 1   polyethylene glycol (MIRALAX / GLYCOLAX) 17 g packet Take 17 g by mouth daily. 14 each 0  sacubitril-valsartan (ENTRESTO) 97-103 MG Take 1 tablet by mouth 2 (two) times daily. 60 tablet 10   senna-docusate (SENOKOT-S) 8.6-50 MG tablet Take  1 tablet by mouth 2 (two) times daily. 60 tablet 0   spironolactone (ALDACTONE) 25 MG tablet Take 1 tablet (25 mg total) by mouth daily. 30 tablet 10   vitamin B-12 (CYANOCOBALAMIN) 1000 MCG tablet Take 1,000 mcg by mouth daily.     zinc gluconate 50 MG tablet Take 50 mg by mouth daily.     No current facility-administered medications for this visit.    PHYSICAL EXAMINATION: ECOG PERFORMANCE STATUS: {CHL ONC ECOG [phone]}  There were no vitals filed for this visit. Wt Readings from Last 3 Encounters:  09/24/22 223 lb (101.2 kg)  09/11/22 225 lb 4 oz (102.2 kg)  08/14/22 220 lb 9.6 oz (100.1 kg)    {Only keep what was examined. If exam not performed, can use .CEXAM } GENERAL:alert, no distress and comfortable SKIN: skin color, texture, turgor are normal, no rashes or significant lesions EYES: normal, Conjunctiva are pink and non-injected, sclera clear {OROPHARYNX:no exudate, no erythema and lips, buccal mucosa, and tongue normal}  NECK: supple, thyroid normal size, non-tender, without nodularity LYMPH:  no palpable lymphadenopathy in the cervical, axillary {or inguinal} LUNGS: clear to auscultation and percussion with normal breathing effort HEART: regular rate & rhythm and no murmurs and no lower extremity edema ABDOMEN:abdomen soft, non-tender and normal bowel sounds Musculoskeletal:no cyanosis of digits and no clubbing  NEURO: alert & oriented x 3 with fluent speech, no focal motor/sensory deficits  LABORATORY DATA:  I have reviewed the data as listed    Latest Ref Rng & Units 09/25/2022    6:53 AM 09/24/2022    6:09 AM 09/23/2022    2:37 AM  CBC  WBC 4.0 - 10.5 K/uL 6.5  5.3  5.0   Hemoglobin 12.0 - 15.0 g/dL 11.3  10.8  10.6   Hematocrit 36.0 - 46.0 % 33.4  32.5  31.4   Platelets 150 - 400 K/uL 543  459  342         Latest Ref Rng & Units 09/25/2022    6:53 AM 09/24/2022    6:09 AM 09/23/2022    2:37 AM  CMP  Glucose 70 - 99 mg/dL 96  99  96   BUN 8 - 23 mg/dL  13  15  15   $ Creatinine 0.44 - 1.00 mg/dL 1.27  1.36  1.29   Sodium 135 - 145 mmol/L 132  132  133   Potassium 3.5 - 5.1 mmol/L 4.5  4.5  4.3   Chloride 98 - 111 mmol/L 106  105  105   CO2 22 - 32 mmol/L 20  21  20   $ Calcium 8.9 - 10.3 mg/dL 8.4  8.4  8.4       RADIOGRAPHIC STUDIES: I have personally reviewed the radiological images as listed and agreed with the findings in the report. No results found.    No orders of the defined types were placed in this encounter.  All questions were answered. The patient knows to call the clinic with any problems, questions or concerns. No barriers to learning was detected. The total time spent in the appointment was {CHL ONC TIME VISIT - [id number].     Baldemar Friday, [place] 09/30/2022   I, Audry Riles, CMA, am acting as scribe for [name], MD.   {Add scribe attestation statement}

## 2022-09-30 NOTE — Assessment & Plan Note (Deleted)
-  received multiple palliative RT -on oxycodone and garbapentin -f/u with palliative care clinic

## 2022-09-30 NOTE — Assessment & Plan Note (Deleted)
stage IB, p(T1cN0M0), Triple negative, Grade 3, in 2018, metastatic disease in 08/2020 ER 20% weakly + PR/HER2 (0) negative, PD-L1 0%, genetics (-), HER2 1+ on node biopsy  -diagnosed with triple negative breast cancer in 05/2017. S/p left breast lumpectomy, adjuvant chemo TC and Radiation.  -biopsy of right gluteus on 09/02/20 due to new pain showed metastatic carcinoma, consistent with breast primary. ER 20% weakly positive, PR and HER2 negative.  -s/p multiple line chemo, including taxol, xeloda, Trodelvy, enhertu -she was on [place], but developed worsening neuropathy and stopped after 07/05/22 and she requested a chemo break  -she started carboplatin and gemcitabine, plus Keytruda on 09/11/2022 -due to the debilitating pain from right lower extremity edema, patient was admitted to the hospital, status post bilateral iliac vein stent placement by IR on 09/24/2022 -restaging CT scan done this morning

## 2022-09-30 NOTE — Transitions of Care (Post Inpatient/ED Visit) (Signed)
   09/30/2022  Name: Kristin Ward [facility] MRN: [id number] DOB: [birth year]  Today's TOC FU Call Status: Today's TOC FU Call Status:: Unsuccessful Call (2nd Attempt) Unsuccessful Call (2nd Attempt) Date: 09/30/22  Attempted to reach the patient regarding the most recent Inpatient/ED visit.  Follow Up Plan: Additional outreach attempts will be made to reach the patient to complete the Transitions of Care (Post Inpatient/ED visit) call.     Enzo Montgomery, RN,BSN,CCM Secretary Management Telephonic Care Management Coordinator Direct Phone: [phone] Toll Free: [phone] Fax: [phone]

## 2022-09-30 NOTE — Telephone Encounter (Signed)
Called pt to check on her 9 A.M. appt. Today. She was up all night last night with cramps and will not be coming in this morning. She has been approved to return to PT. She had stents placed to allow blood flow last Tuesday  Per pt., they are relying on [place] to help her. She never had HHPT. Pt is planning on coming in Wednesday, but knows to call if she is unable to come.

## 2022-09-30 NOTE — Assessment & Plan Note (Deleted)
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 S/p palliative radiation to right iliac 09/04/20 - 09/22/20 with Dr. Moody. -Given localized bone met, she started Zometa q3months on 11/20/20 

## 2022-10-01 ENCOUNTER — Inpatient Hospital Stay (HOSPITAL_COMMUNITY)
Admission: EM | Admit: 2022-10-01 | Discharge: 2022-10-03 | DRG: 315 | Disposition: A | Discharge status: Home / Self Care | Payer: 59 | Source: Ambulatory Visit | Attending: Internal Medicine | Admitting: Internal Medicine

## 2022-10-01 ENCOUNTER — Emergency Department (HOSPITAL_COMMUNITY): Payer: 59

## 2022-10-01 ENCOUNTER — Inpatient Hospital Stay (HOSPITAL_COMMUNITY)
Admission: RE | Admit: 2022-10-01 | Discharge: 2022-10-01 | Disposition: A | Discharge status: Home / Self Care | Payer: 59 | Source: Ambulatory Visit | Attending: Hematology | Admitting: Hematology

## 2022-10-01 ENCOUNTER — Encounter (HOSPITAL_COMMUNITY): Payer: Self-pay

## 2022-10-01 ENCOUNTER — Encounter: Payer: Self-pay | Admitting: Hematology

## 2022-10-01 ENCOUNTER — Other Ambulatory Visit: Payer: Self-pay

## 2022-10-01 ENCOUNTER — Inpatient Hospital Stay: Payer: 59 | Admitting: Hematology

## 2022-10-01 ENCOUNTER — Inpatient Hospital Stay (HOSPITAL_COMMUNITY): Payer: 59

## 2022-10-01 DIAGNOSIS — C50512 Malignant neoplasm of lower-outer quadrant of left female breast: Secondary | ICD-10-CM | POA: Insufficient documentation

## 2022-10-01 DIAGNOSIS — N134 Hydroureter: Secondary | ICD-10-CM | POA: Diagnosis not present

## 2022-10-01 DIAGNOSIS — N1330 Unspecified hydronephrosis: Secondary | ICD-10-CM | POA: Diagnosis not present

## 2022-10-01 DIAGNOSIS — N179 Acute kidney failure, unspecified: Secondary | ICD-10-CM | POA: Diagnosis not present

## 2022-10-01 DIAGNOSIS — I959 Hypotension, unspecified: Secondary | ICD-10-CM | POA: Diagnosis not present

## 2022-10-01 DIAGNOSIS — E860 Dehydration: Secondary | ICD-10-CM

## 2022-10-01 DIAGNOSIS — Z87891 Personal history of nicotine dependence: Secondary | ICD-10-CM | POA: Diagnosis not present

## 2022-10-01 DIAGNOSIS — Z171 Estrogen receptor negative status [ER-]: Secondary | ICD-10-CM

## 2022-10-01 DIAGNOSIS — E785 Hyperlipidemia, unspecified: Secondary | ICD-10-CM | POA: Diagnosis present

## 2022-10-01 DIAGNOSIS — Z8249 Family history of ischemic heart disease and other diseases of the circulatory system: Secondary | ICD-10-CM | POA: Diagnosis not present

## 2022-10-01 DIAGNOSIS — E875 Hyperkalemia: Secondary | ICD-10-CM | POA: Diagnosis present

## 2022-10-01 DIAGNOSIS — J45909 Unspecified asthma, uncomplicated: Secondary | ICD-10-CM | POA: Diagnosis present

## 2022-10-01 DIAGNOSIS — E782 Mixed hyperlipidemia: Secondary | ICD-10-CM | POA: Diagnosis present

## 2022-10-01 DIAGNOSIS — E1122 Type 2 diabetes mellitus with diabetic chronic kidney disease: Secondary | ICD-10-CM | POA: Diagnosis not present

## 2022-10-01 DIAGNOSIS — R55 Syncope and collapse: Secondary | ICD-10-CM | POA: Diagnosis not present

## 2022-10-01 DIAGNOSIS — R600 Localized edema: Secondary | ICD-10-CM | POA: Diagnosis present

## 2022-10-01 DIAGNOSIS — I5042 Chronic combined systolic (congestive) and diastolic (congestive) heart failure: Secondary | ICD-10-CM | POA: Diagnosis present

## 2022-10-01 DIAGNOSIS — E861 Hypovolemia: Secondary | ICD-10-CM | POA: Diagnosis not present

## 2022-10-01 DIAGNOSIS — D631 Anemia in chronic kidney disease: Secondary | ICD-10-CM | POA: Diagnosis present

## 2022-10-01 DIAGNOSIS — N1831 Chronic kidney disease, stage 3a: Secondary | ICD-10-CM | POA: Diagnosis not present

## 2022-10-01 DIAGNOSIS — C7951 Secondary malignant neoplasm of bone: Secondary | ICD-10-CM

## 2022-10-01 DIAGNOSIS — I420 Dilated cardiomyopathy: Secondary | ICD-10-CM | POA: Diagnosis present

## 2022-10-01 DIAGNOSIS — R531 Weakness: Secondary | ICD-10-CM | POA: Diagnosis not present

## 2022-10-01 DIAGNOSIS — I130 Hypertensive heart and chronic kidney disease with heart failure and stage 1 through stage 4 chronic kidney disease, or unspecified chronic kidney disease: Secondary | ICD-10-CM | POA: Diagnosis not present

## 2022-10-01 DIAGNOSIS — Z880 Allergy status to penicillin: Secondary | ICD-10-CM

## 2022-10-01 DIAGNOSIS — Z923 Personal history of irradiation: Secondary | ICD-10-CM

## 2022-10-01 DIAGNOSIS — Z86718 Personal history of other venous thrombosis and embolism: Secondary | ICD-10-CM

## 2022-10-01 DIAGNOSIS — Z885 Allergy status to narcotic agent status: Secondary | ICD-10-CM

## 2022-10-01 DIAGNOSIS — Z803 Family history of malignant neoplasm of breast: Secondary | ICD-10-CM

## 2022-10-01 DIAGNOSIS — I2510 Atherosclerotic heart disease of native coronary artery without angina pectoris: Secondary | ICD-10-CM | POA: Diagnosis not present

## 2022-10-01 DIAGNOSIS — I890 Lymphedema, not elsewhere classified: Secondary | ICD-10-CM | POA: Diagnosis present

## 2022-10-01 DIAGNOSIS — E119 Type 2 diabetes mellitus without complications: Secondary | ICD-10-CM

## 2022-10-01 DIAGNOSIS — T451X5A Adverse effect of antineoplastic and immunosuppressive drugs, initial encounter: Secondary | ICD-10-CM

## 2022-10-01 DIAGNOSIS — Z800 Family history of malignant neoplasm of digestive organs: Secondary | ICD-10-CM

## 2022-10-01 DIAGNOSIS — E1142 Type 2 diabetes mellitus with diabetic polyneuropathy: Secondary | ICD-10-CM | POA: Diagnosis present

## 2022-10-01 DIAGNOSIS — I9589 Other hypotension: Secondary | ICD-10-CM | POA: Diagnosis not present

## 2022-10-01 DIAGNOSIS — R609 Edema, unspecified: Secondary | ICD-10-CM

## 2022-10-01 DIAGNOSIS — Z7984 Long term (current) use of oral hypoglycemic drugs: Secondary | ICD-10-CM

## 2022-10-01 DIAGNOSIS — Z91040 Latex allergy status: Secondary | ICD-10-CM

## 2022-10-01 DIAGNOSIS — G893 Neoplasm related pain (acute) (chronic): Secondary | ICD-10-CM | POA: Diagnosis present

## 2022-10-01 DIAGNOSIS — Z79899 Other long term (current) drug therapy: Secondary | ICD-10-CM

## 2022-10-01 DIAGNOSIS — J9811 Atelectasis: Secondary | ICD-10-CM | POA: Diagnosis not present

## 2022-10-01 DIAGNOSIS — Z7982 Long term (current) use of aspirin: Secondary | ICD-10-CM

## 2022-10-01 LAB — COMPREHENSIVE METABOLIC PANEL
ALT: 20 U/L (ref 0–44)
AST: 43 U/L — ABNORMAL HIGH (ref 15–41)
Albumin: 2.4 g/dL — ABNORMAL LOW (ref 3.5–5.0)
Alkaline Phosphatase: 118 U/L (ref 38–126)
Anion gap: 4 — ABNORMAL LOW (ref 5–15)
BUN: 42 mg/dL — ABNORMAL HIGH (ref 8–23)
CO2: 20 mmol/L — ABNORMAL LOW (ref 22–32)
Calcium: 7.7 mg/dL — ABNORMAL LOW (ref 8.9–10.3)
Chloride: 111 mmol/L (ref 98–111)
Creatinine, Ser: 2.02 mg/dL — ABNORMAL HIGH (ref 0.44–1.00)
GFR, Estimated: 27 mL/min — ABNORMAL LOW (ref >60)
Glucose, Bld: 109 mg/dL — ABNORMAL HIGH (ref 70–99)
Potassium: 4.9 mmol/L (ref 3.5–5.1)
Sodium: 135 mmol/L (ref 135–145)
Total Bilirubin: 0.5 mg/dL (ref 0.3–1.2)
Total Protein: 5.8 g/dL — ABNORMAL LOW (ref 6.5–8.1)

## 2022-10-01 LAB — MRSA NEXT GEN BY PCR, NASAL: MRSA by PCR Next Gen: NOT DETECTED

## 2022-10-01 LAB — CBC WITH DIFFERENTIAL/PLATELET
Abs Immature Granulocytes: 0.19 10*3/uL — ABNORMAL HIGH (ref 0.00–0.07)
Basophils Absolute: 0 10*3/uL (ref 0.0–0.1)
Basophils Relative: 0 %
Eosinophils Absolute: 0.2 10*3/uL (ref 0.0–0.5)
Eosinophils Relative: 2 %
HCT: 30.9 % — ABNORMAL LOW (ref 36.0–46.0)
Hemoglobin: 9.6 g/dL — ABNORMAL LOW (ref 12.0–15.0)
Immature Granulocytes: 2 %
Lymphocytes Relative: 9 %
Lymphs Abs: 0.7 10*3/uL (ref 0.7–4.0)
MCH: 30.2 pg (ref 26.0–34.0)
MCHC: 31.1 g/dL (ref 30.0–36.0)
MCV: 97.2 fL (ref 80.0–100.0)
Monocytes Absolute: 1.3 10*3/uL — ABNORMAL HIGH (ref 0.1–1.0)
Monocytes Relative: 15 %
Neutro Abs: 6.2 10*3/uL (ref 1.7–7.7)
Neutrophils Relative %: 72 %
Platelets: 455 10*3/uL — ABNORMAL HIGH (ref 150–400)
RBC: 3.18 MIL/uL — ABNORMAL LOW (ref 3.87–5.11)
RDW: 15 % (ref 11.5–15.5)
WBC: 8.7 10*3/uL (ref 4.0–10.5)
nRBC: 0 % (ref 0.0–0.2)

## 2022-10-01 LAB — URINALYSIS, W/ REFLEX TO CULTURE (INFECTION SUSPECTED)
Bilirubin Urine: NEGATIVE
Glucose, UA: NEGATIVE mg/dL
Hgb urine dipstick: NEGATIVE
Ketones, ur: NEGATIVE mg/dL
Leukocytes,Ua: NEGATIVE
Nitrite: NEGATIVE
Protein, ur: NEGATIVE mg/dL
Specific Gravity, Urine: 1.016 (ref 1.005–1.030)
pH: 5 (ref 5.0–8.0)

## 2022-10-01 LAB — TROPONIN I (HIGH SENSITIVITY)
Troponin I (High Sensitivity): 9 ng/L (ref <18)
Troponin I (High Sensitivity): 9 ng/L (ref <18)

## 2022-10-01 LAB — I-STAT CHEM 8, ED
BUN: 34 mg/dL — ABNORMAL HIGH (ref 8–23)
Calcium, Ion: 1.18 mmol/L (ref 1.15–1.40)
Chloride: 111 mmol/L (ref 98–111)
Creatinine, Ser: 2.1 mg/dL — ABNORMAL HIGH (ref 0.44–1.00)
Glucose, Bld: 102 mg/dL — ABNORMAL HIGH (ref 70–99)
HCT: 29 % — ABNORMAL LOW (ref 36.0–46.0)
Hemoglobin: 9.9 g/dL — ABNORMAL LOW (ref 12.0–15.0)
Potassium: 5.1 mmol/L (ref 3.5–5.1)
Sodium: 139 mmol/L (ref 135–145)
TCO2: 19 mmol/L — ABNORMAL LOW (ref 22–32)

## 2022-10-01 LAB — PROCALCITONIN: Procalcitonin: <0.1 ng/mL

## 2022-10-01 LAB — POC OCCULT BLOOD, ED: Fecal Occult Bld: NEGATIVE

## 2022-10-01 LAB — LACTIC ACID, PLASMA: Lactic Acid, Venous: 1 mmol/L (ref 0.5–1.9)

## 2022-10-01 LAB — PROTIME-INR
INR: 1.1 (ref 0.8–1.2)
Prothrombin Time: 14.5 seconds (ref 11.4–15.2)

## 2022-10-01 MED ORDER — SODIUM CHLORIDE 0.9 % IV BOLUS
1000.0000 mL | Freq: Once | INTRAVENOUS | Status: AC
  Administered 2022-10-01: 1000 mL via INTRAVENOUS

## 2022-10-01 MED ORDER — ALBUMIN HUMAN 25 % IV SOLN
25.0000 g | Freq: Four times a day (QID) | INTRAVENOUS | Status: AC
  Administered 2022-10-01 – 2022-10-02 (×4): 25 g via INTRAVENOUS
  Filled 2022-10-01 (×4): qty 100

## 2022-10-01 MED ORDER — LACTATED RINGERS IV SOLN: INTRAVENOUS | Status: DC

## 2022-10-01 MED ORDER — ORAL CARE MOUTH RINSE: 15.0000 mL | OROMUCOSAL | Status: DC | PRN

## 2022-10-01 MED ORDER — BRIMONIDINE TARTRATE 0.2 % OP SOLN
1.0000 [drp] | Freq: Two times a day (BID) | OPHTHALMIC | Status: DC
  Administered 2022-10-01 – 2022-10-03 (×4): 1 [drp] via OPHTHALMIC
  Filled 2022-10-01: qty 5

## 2022-10-01 MED ORDER — ENOXAPARIN SODIUM 40 MG/0.4ML IJ SOSY
40.0000 mg | PREFILLED_SYRINGE | INTRAMUSCULAR | Status: DC
  Filled 2022-10-01 (×2): qty 0.4

## 2022-10-01 MED ORDER — SODIUM CHLORIDE (PF) 0.9 % IJ SOLN
INTRAMUSCULAR | Status: AC
  Filled 2022-10-01: qty 50

## 2022-10-01 MED ORDER — INSULIN ASPART 100 UNIT/ML IJ SOLN: 0.0000 [IU] | Freq: Every day | INTRAMUSCULAR | Status: DC

## 2022-10-01 MED ORDER — ACETAMINOPHEN 650 MG RE SUPP: 650.0000 mg | Freq: Four times a day (QID) | RECTAL | Status: DC | PRN

## 2022-10-01 MED ORDER — SODIUM CHLORIDE 0.9 % IV SOLN
2.0000 g | Freq: Two times a day (BID) | INTRAVENOUS | Status: DC
  Administered 2022-10-01 – 2022-10-02 (×2): 2 g via INTRAVENOUS
  Filled 2022-10-01 (×2): qty 12.5

## 2022-10-01 MED ORDER — ONDANSETRON HCL 4 MG/2ML IJ SOLN: 4.0000 mg | Freq: Four times a day (QID) | INTRAMUSCULAR | Status: DC | PRN

## 2022-10-01 MED ORDER — CHLORHEXIDINE GLUCONATE CLOTH 2 % EX PADS
6.0000 | MEDICATED_PAD | Freq: Every day | CUTANEOUS | Status: DC
  Administered 2022-10-01 – 2022-10-03 (×3): 6 via TOPICAL

## 2022-10-01 MED ORDER — HEPARIN SOD (PORK) LOCK FLUSH 100 UNIT/ML IV SOLN: 500.0000 [IU] | Freq: Once | INTRAVENOUS | Status: DC

## 2022-10-01 MED ORDER — INSULIN ASPART 100 UNIT/ML IJ SOLN: 0.0000 [IU] | Freq: Three times a day (TID) | INTRAMUSCULAR | Status: DC

## 2022-10-01 MED ORDER — METRONIDAZOLE 500 MG/100ML IV SOLN
500.0000 mg | Freq: Two times a day (BID) | INTRAVENOUS | Status: DC
  Administered 2022-10-01 – 2022-10-02 (×2): 500 mg via INTRAVENOUS
  Filled 2022-10-01 (×2): qty 100

## 2022-10-01 MED ORDER — GABAPENTIN 300 MG PO CAPS: 600.0000 mg | ORAL_CAPSULE | ORAL | Status: DC

## 2022-10-01 MED ORDER — OXYCODONE HCL 5 MG PO TABS
5.0000 mg | ORAL_TABLET | Freq: Four times a day (QID) | ORAL | Status: DC | PRN
  Administered 2022-10-01 – 2022-10-03 (×5): 5 mg via ORAL
  Filled 2022-10-01 (×5): qty 1

## 2022-10-01 MED ORDER — GABAPENTIN 300 MG PO CAPS
600.0000 mg | ORAL_CAPSULE | ORAL | Status: DC
  Administered 2022-10-02 – 2022-10-03 (×3): 600 mg via ORAL
  Filled 2022-10-01 (×3): qty 2

## 2022-10-01 MED ORDER — HYDROMORPHONE HCL 1 MG/ML IJ SOLN: 1.0000 mg | INTRAMUSCULAR | Status: DC | PRN

## 2022-10-01 MED ORDER — ONDANSETRON HCL 4 MG PO TABS: 4.0000 mg | ORAL_TABLET | Freq: Four times a day (QID) | ORAL | Status: DC | PRN

## 2022-10-01 MED ORDER — GABAPENTIN 300 MG PO CAPS
900.0000 mg | ORAL_CAPSULE | Freq: Every day | ORAL | Status: DC
  Administered 2022-10-01 – 2022-10-02 (×2): 900 mg via ORAL
  Filled 2022-10-01 (×2): qty 3

## 2022-10-01 MED ORDER — SODIUM CHLORIDE 0.9% FLUSH
3.0000 mL | Freq: Two times a day (BID) | INTRAVENOUS | Status: DC
  Administered 2022-10-01 – 2022-10-03 (×5): 3 mL via INTRAVENOUS

## 2022-10-01 MED ORDER — MIDODRINE HCL 5 MG PO TABS
5.0000 mg | ORAL_TABLET | Freq: Three times a day (TID) | ORAL | Status: DC
  Administered 2022-10-01 – 2022-10-03 (×5): 5 mg via ORAL
  Filled 2022-10-01 (×5): qty 1

## 2022-10-01 MED ORDER — SODIUM CHLORIDE 0.9 % IV BOLUS
2000.0000 mL | Freq: Once | INTRAVENOUS | Status: AC
  Administered 2022-10-01: 2000 mL via INTRAVENOUS

## 2022-10-01 MED ORDER — ALBUMIN HUMAN 25 % IV SOLN
25.0000 g | Freq: Once | INTRAVENOUS | Status: AC
  Administered 2022-10-01: 25 g via INTRAVENOUS
  Filled 2022-10-01: qty 100

## 2022-10-01 MED ORDER — ACETAMINOPHEN 325 MG PO TABS
650.0000 mg | ORAL_TABLET | Freq: Four times a day (QID) | ORAL | Status: DC | PRN
  Administered 2022-10-02: 650 mg via ORAL
  Filled 2022-10-01: qty 2

## 2022-10-01 MED ORDER — HEPARIN SOD (PORK) LOCK FLUSH 100 UNIT/ML IV SOLN
INTRAVENOUS | Status: AC
  Administered 2022-10-01: 500 [IU]
  Filled 2022-10-01: qty 5

## 2022-10-01 NOTE — Progress Notes (Signed)
Pharmacy Antibiotic Note  Kristin Ward is a 67 y.o. female admitted on 10/01/2022 with presyncopal vs syncopal episode, presenting from cancer center. Patient with metastatic breast cancer. Right groin wound noted, pungent odor per notes. Pharmacy has been consulted for cefepime dosing.  Patient with noted penicillin allergy. Per chart review, patient has received cefepime in the past.  Plan: -Cefepime 2 g IV q12h based on current renal function -Metronidazole 500 mg IV q12h -Continue to follow renal function, cultures and clinical progress for dose adjustments and de-escalation as indicated  Height: 5' 7"$  (170.2 cm) Weight: 105.5 kg (232 lb 9.4 oz) IBW/kg (Calculated) : 61.6  Temp (24hrs), Avg:98.1 F (36.7 C), Min:97.8 F (36.6 C), Max:98.6 F (37 C)  Recent Labs  Lab 09/25/22 0653 10/01/22 0913 10/01/22 0925 10/01/22 1038  WBC 6.5  --   --  8.7  CREATININE 1.27* 2.02* 2.10*  --   LATICACIDVEN  --   --   --  1.0    Estimated Creatinine Clearance: 33.8 mL/min (A) (by C-G formula based on SCr of 2.1 mg/dL (H)).    Allergies  Allergen Reactions   Morphine And Related Nausea And Vomiting   Latex Hives, Itching and Other (See Comments)    Burning, too   Tylenol With Codeine #3 [Acetaminophen-Codeine] Nausea And Vomiting   Penicillins Nausea Only, Swelling and Rash    Has patient had a PCN reaction causing immediate rash, facial/tongue/throat swelling, SOB or lightheadedness with hypotension: Yes Has patient had a PCN reaction causing severe rash involving mucus membranes or skin necrosis: Yes Has patient had a PCN reaction that required hospitalization: No Has patient had a PCN reaction occurring within the last 10 years: No TONGUE SWELLING AND RASH AROUND MOUTH If all of the above answers are "NO", then may proceed with Cephalosporin use.     Antimicrobials this admission: Cefepime 2/20 >> Metronidazole 2/20 >>  Dose adjustments this admission:  NA  Microbiology results: 2/20 BCx: ordered 2/20 Wound: ordered 2/20 UCx: pending  2/20 MRSA PCR: negative  Thank you for allowing pharmacy to be a part of this patient's care.  Tawnya Crook, PharmD, BCPS Clinical Pharmacist 10/01/2022 6:23 PM

## 2022-10-01 NOTE — H&P (Signed)
History and Physical    Patient: Kristin Ward [id number] DOB: April 24, 1956 DOA: 10/01/2022 DOS: the patient was seen and examined on 10/01/2022 PCP: [name], MD  Patient coming from: Home  Chief Complaint:  Chief Complaint  Patient presents with   Fall    ?   Hypotension   HPI: Kristin Ward is a 67 y.o. female with medical history significant of diabetes mellitus 2, chronic combined heart failure with an EF 40 to 45%, hypertension, dyslipidemia, breast cancer with mets to bone, cancer-related pain requiring chronic narcotics, chemotherapeutic related peripheral neuropathy.  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  She underwent venogram and stent placement.  She did not require chronic anticoagulation since there was no evidence of DVT.  She was discharged to home.  Today she presented back to the cancer center for routine follow-up imaging of the leg.  During the procedure she was noted to have lower than normal blood pressure and apparently had some sort of syncopal event prompting the cancer center team to send her to the ER for further evaluation and treatment.  She also was not at her baseline mentation and was somewhat confused.  In the ER she was afebrile.  Blood pressure somewhat low in the 89-92 range.  She had appropriate O2 saturations.  She had no other complaints.  In further discussion with the patient although she was somewhat confused and occasionally getting some of her details mixed up from best I can tell she had this early morning appointment for the CT scan and opted to take her OxyContin on an empty stomach.  She typically does not do that.  She states that she does not take diuretics although she is on spironolactone.  She states she has been eating and drinking adequately despite appearing clinically dry on  exam.  Because of the low blood pressure and clinical appearance of dehydration she was given 3 L of fluid by the ER physician.  Chest x-ray completed today demonstrated minimal right basilar atelectasis.  ED physician asked hospitalist team to evaluate patient for admission.  In addition to the above patient is denying any nausea vomiting or diarrhea.  Symptoms otherwise as above.  Review of Systems: As mentioned in the history of present illness. All other systems reviewed and are negative. Past Medical History:  Diagnosis Date   Abnormal x-ray of lungs with single pulmonary nodule 03/15/2016   per records from [place]    Anemia    Asthma    Cholelithiasis 05/19/2017   On CT   Coronary artery calcification seen on CAT scan 05/19/2017   Diabetes mellitus without complication ([place])    type 2   Diabetes mellitus, new onset ([place]) 01/20/2019   Dilated idiopathic cardiomyopathy ([place]) 12/2015   EF 15-20%. Diagnosed in [facility]   Headache    NONE RECENT   Hypertension    Lymph nodes enlarged 07/25/2020   Malignant neoplasm of lower-outer quadrant of left breast of female, estrogen receptor negative ([place]) 06/11/2017   left breast   Mixed hyperlipidemia 06/17/2018   partially blind    Personal history of chemotherapy 2018-2019   Personal history of radiation therapy 2019   Retinitis pigmentosa    Retroperitoneal lymphadenopathy 07/25/2020   Uveitis    Past Surgical History:  Procedure Laterality Date   brain cyst removed  2007   to  help with headaches per patient , aspirated    BREAST BIOPSY Left 06/06/2017   x2   BREAST CYST ASPIRATION Left 06/06/2017   BREAST LUMPECTOMY Left 06/26/2017   BREAST LUMPECTOMY WITH RADIOACTIVE SEED AND SENTINEL LYMPH NODE BIOPSY Left 06/26/2017   Procedure: BREAST LUMPECTOMY WITH RADIOACTIVE SEED AND SENTINEL LYMPH NODE BIOPSY ERAS  PATHWAY;  Surgeon: [name], MD;  Location: [place];  Service: General;  Laterality: Left;  pec  block   FINE NEEDLE ASPIRATION Left 02/23/2019   Procedure: FINE NEEDLE ASPIRATION;  Surgeon: [name], MD;  Location: [place];  Service: General;  Laterality: Left;  Asiration of left breast seroma    IR IMAGING GUIDED PORT INSERTION  10/13/2020   IR IVUS EACH ADDITIONAL NON CORONARY VESSEL  09/24/2022   IR IVUS EACH ADDITIONAL NON CORONARY VESSEL  09/24/2022   IR TRANSCATH PLC STENT  EA ADD VEIN  INC ANGIOPLASTY  09/25/2022   IR TRANSCATH PLC STENT  INITIAL VEIN  INC ANGIOPLASTY  09/24/2022   IR US GUIDE VASC ACCESS RIGHT  09/24/2022   IR VENO/EXT/BI  09/23/2022   IR VENO/EXT/UNI LEFT  09/24/2022   IR VENO/EXT/UNI RIGHT  09/24/2022   IR VENOCAVAGRAM IVC  09/25/2022   NASAL TURBINATE REDUCTION     PORT-A-CATH REMOVAL Right 02/23/2019   Procedure: REMOVAL PORT-A-CATH;  Surgeon: [name], MD;  Location: [place];  Service: General;  Laterality: Right;   PORTACATH PLACEMENT N/A 06/26/2017   Procedure: INSERTION PORT-A-CATH;  Surgeon: [name], MD;  Location: [place];  Service: General;  Laterality: N/A;   RADIOLOGY WITH ANESTHESIA Right 09/24/2022   Procedure: ARTERIOGRAM RIGHT LEG;  Surgeon: Radiologist, Medication, MD;  Location: [place];  Service: Radiology;  Laterality: Right;   RIGHT/LEFT HEART CATH AND CORONARY ANGIOGRAPHY N/A 07/31/2022   Procedure: RIGHT/LEFT HEART CATH AND CORONARY ANGIOGRAPHY;  Surgeon: Jolaine Artist, MD;  Location: [place] [facility];  Service: Cardiovascular;  Laterality: N/A;   TRANSTHORACIC ECHOCARDIOGRAM  12/2015   A) [facility] May 2017: Mild concentric LVH. Global hypokinesis. GR daily. EF 18% severe LA dilation. Mitral annular dilatation with papillary muscle dysfunction and moderate MR. Dilated IVC consistent with elevated RAP.   TRANSTHORACIC ECHOCARDIOGRAM  05/2017; 08/2017   a) Mild concentric LVH.  EF 45 to 50% with diffuse hypokinesis.  GR 1 DD.  Mild aortic root dilation.;; b)  Mild LVH EF 45 to 50%.   Diffuse HK.  No significant valve disease.  No significant change.   TRANSTHORACIC ECHOCARDIOGRAM  01/30/2022   [place]:EF 35 to 40%.  Moderate dysfunction with global HK.  Abnormal strain.  Normal RV size and function.  Normal RVSP.  Aortic root estimated 42 mm.  Mild MR.  Mild/trivial AI.  Normal RVP.;  04/01/2022: Preliminary read 40%.  Formal read 45 to 50% (probably 40 to 45%).  Global HK.  GR 1 DD.  Normal RV size and RVP.  Mild MR.  Now measured at 38 mm.  Normal RAP.   Social History:  reports that she quit smoking about 6 years ago. Her smoking use included cigarettes. She has a 5.75 pack-year smoking history. She has never used smokeless tobacco. She reports that she does not drink alcohol and does not use drugs.  Allergies  Allergen Reactions   Morphine And Related Nausea And Vomiting   Latex Hives and Itching    Burning    Tylenol With Codeine #3 [Acetaminophen-Codeine] Nausea And Vomiting   Penicillins Nausea Only,  Swelling and Rash    Has patient had a PCN reaction causing immediate rash, facial/tongue/throat swelling, SOB or lightheadedness with hypotension: Yes Has patient had a PCN reaction causing severe rash involving mucus membranes or skin necrosis: Yes Has patient had a PCN reaction that required hospitalization: No Has patient had a PCN reaction occurring within the last 10 years: No TONGUE SWELLING AND RASH AROUND MOUTH If all of the above answers are "NO", then may proceed with Cephalosporin use.     Family History  Problem Relation Age of Onset   Colon cancer Mother 39       deceased 68   Heart attack Father    Breast cancer Other 8       2nd cousin on maternal side; currently 48   Pancreatic cancer Other        2nd cousin once removed on maternal side; deceased 73s   Fibromyalgia Sister    Esophageal cancer Neg Hx    Rectal cancer Neg Hx    Stomach cancer Neg Hx     Prior to Admission medications   Medication Sig Start Date End Date Taking?  Authorizing Provider  Accu-Chek FastClix Lancets MISC TEST TWICE A DAY. PT USES AN ACCU-CHEK GUIDE ME METER 01/29/21   Henson, Vickie L, NP-C  Ascorbic Acid (VITAMIN C) 500 MG CAPS Take 500 mg by mouth daily.    [provider]  aspirin 81 MG chewable tablet Chew 81 mg by mouth daily.    [provider]  atorvastatin (LIPITOR) 40 MG tablet Take 1 tablet (40 mg total) by mouth daily. 05/11/22   [name], MD  brimonidine (ALPHAGAN) 0.2 % ophthalmic solution Place 1 drop into both eyes 2 (two) times daily. 09/19/21   [provider]  carvedilol (COREG) 12.5 MG tablet TAKE 1 AND 1/2 TABLETS BY MOUTH TWICE DAILY qm Patient taking differently: Take 18.75 mg by mouth 2 (two) times daily with a meal. 06/07/22   [name], MD  Cholecalciferol (VITAMIN D) 50 MCG (2000 UT) CAPS Take 2,000 Units by mouth daily.    [provider]  empagliflozin (JARDIANCE) 10 MG TABS tablet Take 1 tablet (10 mg total) by mouth daily before breakfast. 07/01/22   [name], [name], MD  gabapentin (NEURONTIN) 300 MG capsule Take 3 caps twice a day during the day and 4 caps at bedtime Patient taking differently: Take 600-900 mg by mouth See admin instructions. Take 2 caps ( 600 mg)  twice a day during the day and 3 caps ( 900 mg)  at bedtime 09/12/22   [name], MD  ibuprofen (ADVIL) 800 MG tablet Take 1 tablet (800 mg total) by mouth every 8 (eight) hours as needed. Patient taking differently: Take 800 mg by mouth every 8 (eight) hours as needed for mild pain. 09/07/21   [name], MD  Lancets Misc. (ACCU-CHEK SOFTCLIX LANCET DEV) KIT 1 Units by Does not apply route 2 (two) times daily. Check FSBS BID - Include strips # 50 with 5 refills, Lancets #50 with 5 refills DX: Type 2 DM - ICD 10: E11.9 10/12/21   Francis Gaines B, PA-C  lidocaine-prilocaine (EMLA) cream Apply 1 application topically as needed. 10/23/20   [name], MD  linaclotide [facility]) 72 MCG capsule Take 1 capsule (72 mcg  total) by mouth daily as needed (constipation). 08/28/22   Pickenpack-Cousar, Carlena Sax, NP  magic mouthwash (nystatin, lidocaine, diphenhydrAMINE, alum & mag hydroxide) suspension Swish and swallow 5 mLs by  mouth 4 (four) times daily as needed for mouth pain 06/20/22   [name], MD  Magnesium 300 MG CAPS Take 300 mg by mouth 2 (two) times a week.    [provider]  methocarbamol (ROBAXIN) 500 MG tablet Take 1 tablet (500 mg total) by mouth every 8 (eight) hours as needed for up to 14 days for muscle spasms. 09/27/22 10/11/22  Barb Merino, MD  morphine (MS CONTIN) 15 MG 12 hr tablet Take 1 tablet (15 mg total) by mouth every 12 (twelve) hours for 14 days. 09/27/22 10/11/22  Barb Merino, MD  Multiple Vitamins-Minerals (WOMENS MULTI PO) Take 15 mLs by mouth daily.    [provider]  ondansetron (ZOFRAN) 8 MG tablet Take 1 tablet (8 mg total) by mouth every 8 (eight) hours as needed for nausea or vomiting (begin on day 3 after chemo). 12/21/21   [name], MD  oxyCODONE (OXYCONTIN) 15 mg 12 hr tablet Take 1 tablet (15 mg total) by mouth every 12 (twelve) hours for 7 days. 09/26/22 10/03/22  Barb Merino, MD  oxyCODONE 10 MG TABS Take 1 tablet (10 mg total) by mouth every 6 (six) hours as needed for up to 5 days for severe pain or moderate pain. 09/26/22 10/01/22  Barb Merino, MD  pantoprazole (PROTONIX) 40 MG tablet TAKE 1 TABLET BY MOUTH EVERY DAY Patient taking differently: Take 40 mg by mouth daily as needed (heartburn). 12/06/21   [name], MD  polyethylene glycol (MIRALAX / GLYCOLAX) 17 g packet Take 17 g by mouth daily. 09/26/22   Barb Merino, MD  sacubitril-valsartan (ENTRESTO) 97-103 MG Take 1 tablet by mouth 2 (two) times daily. 04/19/22   [name], MD  senna-docusate (SENOKOT-S) 8.6-50 MG tablet Take 1 tablet by mouth 2 (two) times daily. 09/26/22 10/26/22  Barb Merino, MD  spironolactone (ALDACTONE) 25 MG tablet Take 1 tablet (25 mg total) by mouth daily. 04/19/22    [name], MD  vitamin B-12 (CYANOCOBALAMIN) 1000 MCG tablet Take 1,000 mcg by mouth daily.    [provider]  zinc gluconate 50 MG tablet Take 50 mg by mouth daily.    [provider]    Physical Exam: Vitals:   10/01/22 1000 10/01/22 1015 10/01/22 1145 10/01/22 1146  BP: (!) 89/56 (!) 89/61 100/73   Pulse: 88 88 90   Resp: 15 15 17   $ Temp:    98.6 F (37 C)  TempSrc:    Oral  SpO2: 95% 97% 99%    Constitutional: NAD, calm, comfortable although somewhat confused at times Eyes: PERRL, lids and conjunctivae normal ENMT: Mucous membranes are very dry.  Neck: normal, supple, no masses, no thyromegaly Respiratory: clear to auscultation bilaterally, no wheezing, no crackles. Normal respiratory effort. No accessory muscle use.  Cardiovascular: Regular rate and rhythm, no murmurs / rubs / gallops.  Marked right lower extremity edema which is very tight and apparently unchanged from at time of discharge. 2+ pedal pulses.  Abdomen: no tenderness, no masses palpated. No hepatosplenomegaly. Bowel sounds positive.  Musculoskeletal: no clubbing / cyanosis. No joint deformity upper and lower extremities. Good ROM, no contractures. Normal muscle tone.  Skin: no rashes, lesions, ulcers. No induration Neurologic: CN 2-12 grossly intact. Sensation intact,Strength 5/5 x all 4 extremities.  Psychiatric:  Alert and mostly oriented x 3.  At times she appears to confuse some of her history, normal mood.     Data Reviewed:  Sodium 139, potassium 5.1, chloride 111, glucose 102,  BUN 34, creatinine 2.1 noting initial BUN 42, troponin normal, lactic acid normal, AST slightly elevated at 43, white count 8.7 with normal left shift, hemoglobin 9.6, platelets 455,000, FOB negative  Outpatient CT abdomen and pelvis without contrast completed today: No interval change compared to previous CT 2/7.  Interval stenting of common iliac veins and proximal inferior vena cava as well as persistent  mild right hydronephrosis and hydroureter and persistent right lower extremity edema.  Assessment and Plan: Syncopal event in context of apparent dehydration/persistent low blood pressure Patient continues to have low blood pressure readings despite volume replacement with 3 L Continue lactated Ringer's at 100 cc/hr Hold home Aldactone as well as Entresto in context of recurrent low BP Place in progressive care Given edema of right lower extremity that is unchanged concerned that she may have developed DVT since previous admission and syncope and low blood pressure could be from PE-bilateral lower extremity duplex has been completed this admission and is negative Given persistent low blood pressure readings will need to rule out sepsis therefore we will panculture, check procalcitonin and CRP-no definitive source for infection at this point so will not initiate empiric antibiotics  Breast cancer with metastatic disease Followed by Dr. Burr Medico who will see patient this hospitalization Patient has bone mets and is chronic long-acting and short acting narcotics.  Current home regimen is helping with pain control greatly Need to clarify home regimen noting med rec shows patient is taking MS Contin 15 every 12 hours, OxyContin 15 mg every 12 hours as well as oxycodone 10 mg every 6 hours as needed for breakthrough pain She also apparently takes Advil 800 mg every 8 hours as needed mild pain Currently blood pressure is low so may need to hold off on long-acting pain medications initially until blood pressure stabilizes. With low BP will provide IV Dilaudid and resume part of her all of her home narcotic regimen when vital signs stable Okay to resume Neurontin for chemotherapeutic induced neuropathy  RLE edema secondary to metastatic lesions compressing iliac vein and pelvis Recent stent placement and follow-up CT stable  Chronic combined heart failure with EF 40 to 45% Patient on Entresto and  Aldactone at home Presents with dehydration and now with mild hyperkalemia in context of AKI and use of Aldactone. Will hold Aldactone and Entresto for now and rehydrate as above Hold carvedilol as well Consider adjusting heart failure GDMT given current presentation with hypotension and syncope  Acute kidney injury on CKD 3a Baseline creatinine between 1.17 and 1.29 with current creatinine 2.10 with BUN 34 Hold home as needed Advil and baby aspirin for now  Diabetes mellitus 2 On Jardiance at home Follow CBGs and provide SSI Hemoglobin A1c 5.6 February 8  Hypertension Holding cardiac medications as above  Dyslipidemia Continue Lipitor      Advance Care Planning:   Code Status: Full Code   Consults: Oncology  Family Communication: Patient only  Severity of Illness: The appropriate patient status for this patient is INPATIENT. Inpatient status is judged to be reasonable and necessary in order to provide the required intensity of service to ensure the patient's safety. The patient's presenting symptoms, physical exam findings, and initial radiographic and laboratory data in the context of their chronic comorbidities is felt to place them at high risk for further clinical deterioration. Furthermore, it is not anticipated that the patient will be medically stable for discharge from the hospital within 2 midnights of admission.   * I certify that at  the point of admission it is my clinical judgment that the patient will require inpatient hospital care spanning beyond 2 midnights from the point of admission due to high intensity of service, high risk for further deterioration and high frequency of surveillance required.*  Author: Erin Hearing, NP 10/01/2022 12:04 PM  For on call review [email or web address].

## 2022-10-01 NOTE — ED Triage Notes (Signed)
Pt found on ground in between sliding doors at cancer center. Patient states that she was here today to get a CT scan of her right leg. Patient denies falling, hitting head, blood thinners. Patient stated that her right leg hurt so she sat and layed on the ground. Patient also reportedly took oxycodone, morphine, and robaxin today. Patient currently A&Ox4.

## 2022-10-01 NOTE — Progress Notes (Addendum)
Patient refused CBG, education provided.

## 2022-10-01 NOTE — ED Provider Notes (Signed)
[place] EMERGENCY DEPARTMENT AT [facility] Provider Note   CSN: [id number] Arrival date & time: 10/01/22  [id number]     History  Chief Complaint  Patient presents with   Fall    ?   Hypotension    Kristin Ward is a 67 y.o. female.  Patient has history of metastatic cancer.  She was coming to the hospital to get a CT scan today and she collapsed.  She was hypotensive blood.  The history is provided by the patient and medical records. No language interpreter was used.  Fall This is a new problem. The current episode started 3 to 5 hours ago. The problem occurs rarely. The problem has been resolved. Pertinent negatives include no chest pain, no abdominal pain and no headaches. Nothing aggravates the symptoms. She has tried nothing for the symptoms.       Home Medications Prior to Admission medications   Medication Sig Start Date End Date Taking? Authorizing Provider  Accu-Chek FastClix Lancets MISC TEST TWICE A DAY. PT USES AN ACCU-CHEK GUIDE ME METER 01/29/21   Henson, Vickie L, NP-C  Ascorbic Acid (VITAMIN C) 500 MG CAPS Take 500 mg by mouth daily.    [provider]  aspirin 81 MG chewable tablet Chew 81 mg by mouth daily.    [provider]  atorvastatin (LIPITOR) 40 MG tablet Take 1 tablet (40 mg total) by mouth daily. 05/11/22   [name], MD  brimonidine (ALPHAGAN) 0.2 % ophthalmic solution Place 1 drop into both eyes 2 (two) times daily. 09/19/21   [provider]  carvedilol (COREG) 12.5 MG tablet TAKE 1 AND 1/2 TABLETS BY MOUTH TWICE DAILY qm Patient taking differently: Take 18.75 mg by mouth 2 (two) times daily with a meal. 06/07/22   [name], MD  Cholecalciferol (VITAMIN D) 50 MCG (2000 UT) CAPS Take 2,000 Units by mouth daily.    [provider]  empagliflozin (JARDIANCE) 10 MG TABS tablet Take 1 tablet (10 mg total) by mouth daily before breakfast. 07/01/22   [name], [name], MD  gabapentin  (NEURONTIN) 300 MG capsule Take 3 caps twice a day during the day and 4 caps at bedtime Patient taking differently: Take 600-900 mg by mouth See admin instructions. Take 2 caps ( 600 mg)  twice a day during the day and 3 caps ( 900 mg)  at bedtime 09/12/22   [name], MD  ibuprofen (ADVIL) 800 MG tablet Take 1 tablet (800 mg total) by mouth every 8 (eight) hours as needed. Patient taking differently: Take 800 mg by mouth every 8 (eight) hours as needed for mild pain. 09/07/21   [name], MD  Lancets Misc. (ACCU-CHEK SOFTCLIX LANCET DEV) KIT 1 Units by Does not apply route 2 (two) times daily. Check FSBS BID - Include strips # 50 with 5 refills, Lancets #50 with 5 refills DX: Type 2 DM - ICD 10: E11.9 10/12/21   Francis Gaines B, PA-C  lidocaine-prilocaine (EMLA) cream Apply 1 application topically as needed. 10/23/20   [name], MD  linaclotide [facility]) 72 MCG capsule Take 1 capsule (72 mcg total) by mouth daily as needed (constipation). 08/28/22   Pickenpack-Cousar, Carlena Sax, NP  magic mouthwash (nystatin, lidocaine, diphenhydrAMINE, alum & mag hydroxide) suspension Swish and swallow 5 mLs by mouth 4 (four) times daily as needed for mouth pain 06/20/22   [name], MD  Magnesium 300 MG CAPS Take 300 mg by mouth 2 (two) times a  week.    [provider]  methocarbamol (ROBAXIN) 500 MG tablet Take 1 tablet (500 mg total) by mouth every 8 (eight) hours as needed for up to 14 days for muscle spasms. 09/27/22 10/11/22  Barb Merino, MD  morphine (MS CONTIN) 15 MG 12 hr tablet Take 1 tablet (15 mg total) by mouth every 12 (twelve) hours for 14 days. 09/27/22 10/11/22  Barb Merino, MD  Multiple Vitamins-Minerals (WOMENS MULTI PO) Take 15 mLs by mouth daily.    [provider]  ondansetron (ZOFRAN) 8 MG tablet Take 1 tablet (8 mg total) by mouth every 8 (eight) hours as needed for nausea or vomiting (begin on day 3 after chemo). 12/21/21   [name], MD  oxyCODONE (OXYCONTIN) 15 mg 12 hr tablet  Take 1 tablet (15 mg total) by mouth every 12 (twelve) hours for 7 days. 09/26/22 10/03/22  Barb Merino, MD  oxyCODONE 10 MG TABS Take 1 tablet (10 mg total) by mouth every 6 (six) hours as needed for up to 5 days for severe pain or moderate pain. 09/26/22 10/01/22  Barb Merino, MD  pantoprazole (PROTONIX) 40 MG tablet TAKE 1 TABLET BY MOUTH EVERY DAY Patient taking differently: Take 40 mg by mouth daily as needed (heartburn). 12/06/21   [name], MD  polyethylene glycol (MIRALAX / GLYCOLAX) 17 g packet Take 17 g by mouth daily. 09/26/22   Barb Merino, MD  sacubitril-valsartan (ENTRESTO) 97-103 MG Take 1 tablet by mouth 2 (two) times daily. 04/19/22   [name], MD  senna-docusate (SENOKOT-S) 8.6-50 MG tablet Take 1 tablet by mouth 2 (two) times daily. 09/26/22 10/26/22  Barb Merino, MD  spironolactone (ALDACTONE) 25 MG tablet Take 1 tablet (25 mg total) by mouth daily. 04/19/22   [name], MD  vitamin B-12 (CYANOCOBALAMIN) 1000 MCG tablet Take 1,000 mcg by mouth daily.    [provider]  zinc gluconate 50 MG tablet Take 50 mg by mouth daily.    [provider]      Allergies    Morphine and related, Latex, Tylenol with codeine #3 [acetaminophen-codeine], and Penicillins    Review of Systems   Review of Systems  Constitutional:  Negative for appetite change and fatigue.  HENT:  Negative for congestion, ear discharge and sinus pressure.   Eyes:  Negative for discharge.  Respiratory:  Negative for cough.   Cardiovascular:  Negative for chest pain.  Gastrointestinal:  Negative for abdominal pain and diarrhea.  Genitourinary:  Negative for frequency and hematuria.  Musculoskeletal:  Negative for back pain.       Severely swollen right lower leg  Skin:  Negative for rash.  Neurological:  Positive for weakness. Negative for seizures and headaches.  Psychiatric/Behavioral:  Negative for hallucinations.     Physical Exam Updated Vital Signs BP 100/73    Pulse 90   Temp 98.6 F (37 C) (Oral)   Resp 17   SpO2 99%  Physical Exam Vitals and nursing note reviewed.  Constitutional:      Appearance: She is well-developed.  HENT:     Head: Normocephalic.     Comments: Dry mucous membrane Eyes:     General: No scleral icterus.    Conjunctiva/sclera: Conjunctivae normal.  Neck:     Thyroid: No thyromegaly.  Cardiovascular:     Rate and Rhythm: Normal rate and regular rhythm.     Heart sounds: No murmur heard.    No friction rub. No gallop.  Pulmonary:  Breath sounds: No stridor. No wheezing or rales.  Chest:     Chest wall: No tenderness.  Abdominal:     General: There is no distension.     Tenderness: There is no abdominal tenderness. There is no rebound.  Musculoskeletal:        General: Normal range of motion.     Cervical back: Neck supple.     Comments: Swollen right lower leg  Lymphadenopathy:     Cervical: No cervical adenopathy.  Skin:    Findings: No erythema or rash.  Neurological:     Mental Status: She is alert and oriented to person, place, and time.     Motor: No abnormal muscle tone.     Coordination: Coordination normal.  Psychiatric:        Behavior: Behavior normal.     ED Results / Procedures / Treatments   Labs (all labs ordered are listed, but only abnormal results are displayed) Labs Reviewed  COMPREHENSIVE METABOLIC PANEL - Abnormal; Notable for the following components:      Result Value   CO2 20 (*)    Glucose, Bld 109 (*)    BUN 42 (*)    Creatinine, Ser 2.02 (*)    Calcium 7.7 (*)    Total Protein 5.8 (*)    Albumin 2.4 (*)    AST 43 (*)    GFR, Estimated 27 (*)    Anion gap 4 (*)    All other components within normal limits  CBC WITH DIFFERENTIAL/PLATELET - Abnormal; Notable for the following components:   RBC 3.18 (*)    Hemoglobin 9.6 (*)    HCT 30.9 (*)    Platelets 455 (*)    Monocytes Absolute 1.3 (*)    Abs Immature Granulocytes 0.19 (*)    All other components within  normal limits  I-STAT CHEM 8, ED - Abnormal; Notable for the following components:   BUN 34 (*)    Creatinine, Ser 2.10 (*)    Glucose, Bld 102 (*)    TCO2 19 (*)    Hemoglobin 9.9 (*)    HCT 29.0 (*)    All other components within normal limits  PROTIME-INR  LACTIC ACID, PLASMA  CBC WITH DIFFERENTIAL/PLATELET  LACTIC ACID, PLASMA  POC OCCULT BLOOD, ED  TROPONIN I (HIGH SENSITIVITY)    EKG EKG Interpretation  Date/Time:  Tuesday October 01 2022 09:00:32 EST Ventricular Rate:  84 PR Interval:  236 QRS Duration: 114 QT Interval:  406 QTC Calculation: 480 R Axis:   54 Text Interpretation: Sinus rhythm Prolonged PR interval Borderline intraventricular conduction delay Borderline low voltage, extremity leads Confirmed by Milton Ferguson [phone]) on 10/01/2022 10:38:36 AM  Radiology DG Chest Port 1 View  Result Date: 10/01/2022 CLINICAL DATA:  Weakness, found on ground EXAM: PORTABLE CHEST 1 VIEW COMPARISON:  Portable exam 1046 hours compared to 07/27/2021 FINDINGS: RIGHT jugular Port-A-Cath with tip projecting over SVC. Normal heart size, mediastinal contours, and pulmonary vascularity. Minimal subsegmental atelectasis at RIGHT base. Remaining lungs clear. No acute infiltrate, pleural effusion, pneumothorax, or acute osseous findings. Surgical clips LEFT breast and LEFT axilla. IMPRESSION: Minimal RIGHT basilar atelectasis. Electronically Signed   By: Lavonia Dana M.D.   On: 10/01/2022 10:56   CT ABDOMEN PELVIS WO CONTRAST  Result Date: 10/01/2022 CLINICAL DATA:  LEFT breast cancer. Oral chemotherapy ongoing. Evaluate for metastatic disease. * Tracking Code: BO * EXAM: CT ABDOMEN AND PELVIS WITHOUT CONTRAST TECHNIQUE: Multidetector CT imaging of the abdomen and pelvis  was performed following the standard protocol without IV contrast. RADIATION DOSE REDUCTION: This exam was performed according to the departmental dose-optimization program which includes automated exposure control,  adjustment of the mA and/or kV according to patient size and/or use of iterative reconstruction technique. COMPARISON:  None Available. FINDINGS: Lower chest: Lung bases are clear. Round fluid collection in the LEFT breast presumed to represent postoperative seroma measuring 3 cm. Hepatobiliary: No focal hepatic lesion on noncontrast exam. Gallstone noted. No cholecystitis. Pancreas: Pancreas is normal. No ductal dilatation. No pancreatic inflammation. Spleen: Normal spleen Adrenals/urinary tract: Adrenal glands are normal. Persistent mild RIGHT hydronephrosis and hydroureter. Hydroureter extends to the RIGHT iliac vessels where potential compression by the enlarged uterus. No change from prior other than stenting of the IVC the common iliac vessels Bladder normal. Stomach/Bowel: Small amount fluid esophagus. The stomach, duodenum, and small bowel normal. Normal appendix. The colon and rectosigmoid colon are normal. Vascular/Lymphatic: Mild intimal calcification of the aorta. No dilatation. Mild adenopathy drapes over the LEFT aspect of the aorta (image 41/2. No interval change. Interval stenting of the common iliac veins and proximal IVC. No pelvic lymphadenopathy Reproductive: Enlarged lobular uterus unchanged from prior. 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 Some fluid in the endometrial canal similar prior. Other: Again demonstrated significant edema of the proximal LEFT lower extremity. Musculoskeletal: No aggressive osseous lesion. IMPRESSION: 1. No significant interval change from CT 09/18/2022. No evidence of metastatic disease progression. 2. Interval stenting of the common iliac veins and proximal inferior vena cava. Persistent mild RIGHT hydronephrosis and hydroureter. Persistent RIGHT lower extremity edema. 3. Mild periaortic retroperitoneal adenopathy not changed from prior. 4. Stable enlarged uterus. 5. Postoperative seroma in the LEFT breast Electronically Signed   By: Suzy Bouchard M.D.   On:  10/01/2022 10:33    Procedures Procedures    Medications Ordered in ED Medications  sodium chloride 0.9 % bolus 2,000 mL (0 mLs Intravenous Stopped 10/01/22 1027)  sodium chloride 0.9 % bolus 1,000 mL (1,000 mLs Intravenous New Bag/Given 10/01/22 1027)    ED Course/ Medical Decision Making/ A&P   CRITICAL CARE Performed by: Milton Ferguson Total critical care time: 35 minutes Critical care time was exclusive of separately billable procedures and treating other patients. Critical care was necessary to treat or prevent imminent or life-threatening deterioration. Critical care was time spent personally by me on the following activities: development of treatment plan with patient and/or surrogate as well as nursing, discussions with consultants, evaluation of patient's response to treatment, examination of patient, obtaining history from patient or surrogate, ordering and performing treatments and interventions, ordering and review of laboratory studies, ordering and review of radiographic studies, pulse oximetry and re-evaluation of patient's condition.     Patient with hypotension and AKI.  I spoke with Dr. Burr Medico of oncology and she recommended admitting the patient to the hospitalist and oncology will follow                           Medical Decision Making Amount and/or Complexity of Data Reviewed Labs: ordered. Radiology: ordered. ECG/medicine tests: ordered.  Risk Decision regarding hospitalization.  This patient presents to the ED for concern of weakness, this involves an extensive number of treatment options, and is a complaint that carries with it a high risk of complications and morbidity.  The differential diagnosis includes MI, anemia   Co morbidities that complicate the patient evaluation  Metastatic  cancer, large blood clot in right large  leg   Additional history obtained:  Additional history obtained from patient External records from outside source obtained and  reviewed including hospital records   Lab Tests:  I Ordered, and personally interpreted labs.  The pertinent results include: BUN 34, creatinine 2.1   Imaging Studies ordered:  I ordered imaging studies including chest x-ray I independently visualized and interpreted imaging which showed unremarkable I agree with the radiologist interpretation   Cardiac Monitoring: / EKG:  The patient was maintained on a cardiac monitor.  I personally viewed and interpreted the cardiac monitored which showed an underlying rhythm of: Normal sinus rhythm   Consultations Obtained:  I requested consultation with the oncology and hospitalist,  and discussed lab and imaging findings as well as pertinent plan - they recommend:.  Admit with oncology consult   Problem List / ED Course / Critical interventions / Medication management  Weakness, metastatic cancer and dehydration I ordered medication including normal saline for dehydration Reevaluation of the patient after these medicines showed that the patient improved I have reviewed the patients home medicines and have made adjustments as needed   Social Determinants of Health:  None   Test / Admission - Considered:  None  Patient with hypotension and AKI.  She will be admitted to medicine with oncology following        Final Clinical Impression(s) / ED Diagnoses Final diagnoses:  AKI (acute kidney injury) ([place])  Hypotension due to hypovolemia    Rx / DC Orders ED Discharge Orders     None         Milton Ferguson, MD 10/01/22 [phone]

## 2022-10-01 NOTE — Progress Notes (Signed)
Patient was educated that the provider would like for her to have a foley catheter placed for retension/accurate output as the patient has not urinated since admission to ICU (patient has currently received a total of 4L of fluid since admission to ED). Patient stated, "It doesn't matter since you all are forcing me." Patient was educated that she has every right to refuse care or discuss with the provider her options. Patient informed this nurse again that she has no say in her care and to, "Do whatever since I have no say." Again, patient educated that no care will be forced upon her and if she refuses to have this foley placed, she can inform this nurse of her request. The patient again stated it does not matter and this nurse should just do whatever the doctor stated. PM charge nurse informed of this patient's agitation in care.

## 2022-10-01 NOTE — Progress Notes (Signed)
Patient refused CBG and insulin stating, "I am not a diabetic anymore and I do not want my sugars checked, nor do I want any insulin." Informed Dr. Grandville Silos of patient's refusal and statement.

## 2022-10-01 NOTE — Progress Notes (Signed)
Bilateral lower extremity venous duplex has been completed. Preliminary results can be found in CV Proc through chart review.  Results were given to Erin Hearing NP.  10/01/22 2:43 PM Carlos Levering RVT

## 2022-10-02 ENCOUNTER — Other Ambulatory Visit: Payer: Self-pay

## 2022-10-02 ENCOUNTER — Ambulatory Visit: Payer: 59

## 2022-10-02 ENCOUNTER — Ambulatory Visit: Payer: Medicare Other | Admitting: Cardiology

## 2022-10-02 DIAGNOSIS — R55 Syncope and collapse: Secondary | ICD-10-CM | POA: Diagnosis not present

## 2022-10-02 LAB — CBC WITH DIFFERENTIAL/PLATELET
Abs Immature Granulocytes: 0.14 10*3/uL — ABNORMAL HIGH (ref 0.00–0.07)
Basophils Absolute: 0 10*3/uL (ref 0.0–0.1)
Basophils Relative: 0 %
Eosinophils Absolute: 0.3 10*3/uL (ref 0.0–0.5)
Eosinophils Relative: 4 %
HCT: 29.4 % — ABNORMAL LOW (ref 36.0–46.0)
Hemoglobin: 8.9 g/dL — ABNORMAL LOW (ref 12.0–15.0)
Immature Granulocytes: 2 %
Lymphocytes Relative: 7 %
Lymphs Abs: 0.5 10*3/uL — ABNORMAL LOW (ref 0.7–4.0)
MCH: 29.8 pg (ref 26.0–34.0)
MCHC: 30.3 g/dL (ref 30.0–36.0)
MCV: 98.3 fL (ref 80.0–100.0)
Monocytes Absolute: 1.3 10*3/uL — ABNORMAL HIGH (ref 0.1–1.0)
Monocytes Relative: 19 %
Neutro Abs: 4.7 10*3/uL (ref 1.7–7.7)
Neutrophils Relative %: 68 %
Platelets: 387 10*3/uL (ref 150–400)
RBC: 2.99 MIL/uL — ABNORMAL LOW (ref 3.87–5.11)
RDW: 15.3 % (ref 11.5–15.5)
WBC: 7 10*3/uL (ref 4.0–10.5)
nRBC: 0 % (ref 0.0–0.2)

## 2022-10-02 LAB — BASIC METABOLIC PANEL
Anion gap: 9 (ref 5–15)
BUN: 28 mg/dL — ABNORMAL HIGH (ref 8–23)
CO2: 16 mmol/L — ABNORMAL LOW (ref 22–32)
Calcium: 7.9 mg/dL — ABNORMAL LOW (ref 8.9–10.3)
Chloride: 111 mmol/L (ref 98–111)
Creatinine, Ser: 1.33 mg/dL — ABNORMAL HIGH (ref 0.44–1.00)
GFR, Estimated: 45 mL/min — ABNORMAL LOW (ref >60)
Glucose, Bld: 88 mg/dL (ref 70–99)
Potassium: 4.4 mmol/L (ref 3.5–5.1)
Sodium: 136 mmol/L (ref 135–145)

## 2022-10-02 LAB — URINE CULTURE: Culture: NO GROWTH

## 2022-10-02 LAB — C-REACTIVE PROTEIN: CRP: 6.4 mg/dL — ABNORMAL HIGH (ref <1.0)

## 2022-10-02 LAB — CORTISOL-AM, BLOOD: Cortisol - AM: 12.2 ug/dL (ref 6.7–22.6)

## 2022-10-02 LAB — MAGNESIUM: Magnesium: 2.4 mg/dL (ref 1.7–2.4)

## 2022-10-02 MED ORDER — METOPROLOL TARTRATE 5 MG/5ML IV SOLN: 5.0000 mg | INTRAVENOUS | Status: DC | PRN

## 2022-10-02 MED ORDER — DAKINS (1/4 STRENGTH) 0.125 % EX SOLN
Freq: Every day | CUTANEOUS | Status: DC
  Filled 2022-10-02 (×2): qty 473

## 2022-10-02 MED ORDER — METHOCARBAMOL 500 MG PO TABS: 500.0000 mg | ORAL_TABLET | Freq: Three times a day (TID) | ORAL | Status: DC | PRN

## 2022-10-02 MED ORDER — TRAZODONE HCL 50 MG PO TABS: 50.0000 mg | ORAL_TABLET | Freq: Every evening | ORAL | Status: DC | PRN

## 2022-10-02 MED ORDER — IPRATROPIUM-ALBUTEROL 0.5-2.5 (3) MG/3ML IN SOLN: 3.0000 mL | RESPIRATORY_TRACT | Status: DC | PRN

## 2022-10-02 MED ORDER — GUAIFENESIN 100 MG/5ML PO LIQD: 5.0000 mL | ORAL | Status: DC | PRN

## 2022-10-02 MED ORDER — ATORVASTATIN CALCIUM 40 MG PO TABS
40.0000 mg | ORAL_TABLET | Freq: Every day | ORAL | Status: DC
  Administered 2022-10-02 – 2022-10-03 (×2): 40 mg via ORAL
  Filled 2022-10-02 (×2): qty 1

## 2022-10-02 MED ORDER — HYDRALAZINE HCL 20 MG/ML IJ SOLN: 10.0000 mg | INTRAMUSCULAR | Status: DC | PRN

## 2022-10-02 MED ORDER — SENNOSIDES-DOCUSATE SODIUM 8.6-50 MG PO TABS: 1.0000 | ORAL_TABLET | Freq: Every evening | ORAL | Status: DC | PRN

## 2022-10-02 NOTE — TOC Initial Note (Signed)
Transition of Care [facility]) - Initial/Assessment Note    Patient Details  Name: Kristin Ward MRN: [id number] Date of Birth: Sep 28, 1955  Transition of Care [facility]) CM/SW Contact:    Ninfa Meeker, RN Phone Number: 10/02/2022, 2:30 PM  Clinical Narrative:                 Transition of Care Screening Note:  Transition of Care [facility]) Department has reviewed patient and no TOC needs have been identified at this time. We will continue to monitor patient advancement through Interdisciplinary progressions and if new patient needs arise, please place a consult.        Patient Goals and CMS Choice            Expected Discharge Plan and Services                                              Prior Living Arrangements/Services                       Activities of Daily Living      Permission Sought/Granted                  Emotional Assessment              Admission diagnosis:  Syncope and collapse [R55] AKI (acute kidney injury) ([place]) [N17.9] Hypotension [I95.9] Hypotension due to hypovolemia [I95.89, E86.1] Patient Active Problem List   Diagnosis Date Noted   Syncope and collapse 10/01/2022   Hypotension 10/01/2022   AKI (acute kidney injury) ([place]) 10/01/2022   Dehydration 10/01/2022   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   Cancer related pain 07/26/2022   Swelling 04/16/2022   Constipation 02/21/2022   Hypertension associated with diabetes ([place]) 11/26/2021   Class 1 obesity due to excess calories with serious comorbidity and body mass index (BMI) of 33.0 to 33.9 in adult 11/26/2021   Pain due to onychomycosis of toenails of both feet 11/21/2021   Neuropathy  associated with cancer ([place]) 11/21/2021   History of chemotherapy 09/14/2021   Uncontrolled type 2 diabetes mellitus with hyperglycemia ([place]) 03/08/2021   Neuropathy 01/12/2021   Situational depression 01/12/2021   Goals of care, counseling/discussion 09/15/2020   Malignant neoplasm metastatic to bone ([place]) 08/30/2020   Pain from bone metastases ([name]) 08/30/2020   Right hip pain 08/22/2020   Lymph nodes enlarged 07/25/2020   Retroperitoneal lymphadenopathy 07/25/2020   History of breast cancer 05/11/2020   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   Chronic right hip pain 04/05/2020   Vitamin D deficiency 10/07/2019   Elevated serum creatinine 10/07/2019   Hyperlipidemia 06/17/2018   Influenza vaccination declined 06/17/2018   Obesity (BMI 30-39.9) 06/17/2018   Port-A-Cath in place 09/08/2017   Other fatigue 08/29/2017   Anemia 08/29/2017   High risk medication use 08/29/2017   Syncope 08/25/2017   Blind in both eyes 08/25/2017   Genetic testing 06/19/2017   Family history of colon cancer    Malignant neoplasm of lower-outer quadrant of left breast of female, estrogen receptor negative ([place]) 06/11/2017   Cholelithiasis 05/19/2017  Coronary artery calcification seen on CAT scan 05/19/2017   Low grade squamous intraepith lesion on cytologic smear cervix ([place]) 04/09/2017   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 [place] [address] Phone: [phone] Fax:  [phone]  [id number] [id number] [id number]: [phone]: [phone]    Social Determinants of Health (SDOH) Social History: [place] No Food Insecurity (09/18/2022)  Housing: Low Risk  (09/18/2022)  Transportation Needs: No Transportation Needs (09/18/2022)  Utilities: Not At Risk (09/18/2022)  Depression (PHQ2-9): Low Risk  (08/09/2022)  Financial Resource Strain: Low Risk  (08/09/2022)  Physical Activity: Sufficiently Active (08/09/2022)  Social Connections: Unknown (09/01/2020)  Stress: No Stress Concern Present (08/09/2022)  Tobacco Use: Medium Risk (10/01/2022)   SDOH Interventions:     Readmission Risk Interventions    09/26/2022   10:29 AM  Readmission Risk Prevention Plan  Transportation Screening Complete  PCP or Specialist Appt within 3-5 Days Complete  [place] or [place]  Social Work Consult for RJarrattPlanning/Counseling Complete  Palliative Care Screening Not Applicable  Medication Review (Press photographer Complete

## 2022-10-02 NOTE — Consult Note (Signed)
[place] Nurse Consult Note: Reason for Consult: groin wound, reviewed records. Metastatic lesion in the groin, patient currently undergoing oncology care at the [facility] cancer center.  Wound type: cancerous  Pressure Injury POA: Yes Measurement:19cm x 4cm per WTA (wound treatment associate) Wound bed: 95% yellow/5% other Drainage (amount, consistency, odor) minimal, odor.  ED notes and others noted purulent drainange Periwound: intact  Dressing procedure/placement/frequency: Palliative wound care orders for odor control:  Cleanse with saline, pat dry per patient's tolerance Moisten gauze with 1/4% Dakin's solution and cover/pack wound, top with ABD pad Secure with tape or mesh underwear per patient's preference Change daily    Re consult if needed, will not follow at this time. Thanks  Marketta Valadez [employer], RN,CWOCN, CNS, [place] [phone])

## 2022-10-02 NOTE — Progress Notes (Signed)
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  Patient is not on anticoagulation presented back to cancer center for follow-up imaging and was noted to have possible presyncopal symptoms and hypotension.   Assessment & Plan:  Principal Problem:   Syncope and collapse Active Problems:   Hyperlipidemia   Edema of right lower extremity   Hypotension   AKI (acute kidney injury) ([place])   Dehydration   Malignant neoplasm of lower-outer quadrant of left female breast ([place])   Presyncopal versus syncopal/hypotension Dehydration -Upon admission hypotensive concerns of dehydration versus possible infection.  UA, chest x-ray negative.  Cultures drawn.  Procalcitonin-negative - Stop Abx - IV fluid, midodrine.  A.m. 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 Burr Medico on chronic pain medications. -Neurontin   Severe right lower extremity edema secondary to metastatic lesion compressing iliac vein and pelvis - Status post recent venogram and stent placement by IR on 2/13 follow-up CT stable. -Lower extremity Dopplers-negative for DVT -Compression stockings  Acute kidney injury on CKD stage IIIa, improved Right-sided hydronephrosis - Baseline creatinine 1.2.  Admission creatinine 2.1, getting IV fluids.  Creatinine 1.3 - Nephrotoxic drugs on hold -Hydronephrosis but asymptomatic seen on previous scan.  Dr. Burr Medico had discussed with Dr. 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  Resume GDMT when medically stable  Anemia of chronic disease - Baseline hemoglobin 9.0    Well-controlled diabetes mellitus type 2 -A1c 5.9 on September 19, 2022.  Holding [place].  On sliding scale and Accu-Cheks   Hypertension -On hold.   Hyperlipidemia -Statin   Right groin wound. -Wound care ordered by admitting provider   DVT prophylaxis: Lovenox Code Status: Full code Family Communication:  Spoke with her daughter/   Status is: Inpatient On going evalution of syncope, dehydration and aki   Right groin wound, present prior to admission   Subjective: Tells me feels better. She laid down on the floor to stretch her legs and she tells me it was mistaken for her syncope.  I advised her BP was low and elevated Cr.   Examination:  Constitutional: Not in acute distress Respiratory: Clear to auscultation bilaterally Cardiovascular: Normal sinus rhythm, no rubs Abdomen: Nontender nondistended good bowel sounds Musculoskeletal: No edema noted Skin: No rashes seen Neurologic: CN 2-12 grossly intact.  And nonfocal Psychiatric: Normal judgment and insight. Alert and oriented x 3. Normal mood.    Right chest wall port in place Foley catheter  Objective: Vitals:   10/02/22 0350 10/02/22 0400 10/02/22 0500 10/02/22 0600  BP:  129/73 114/74 113/75  Pulse:  95 93 96  Resp:  20 17 16  $ Temp: 98.4 F (36.9 C)     TempSrc: Oral     SpO2:  96% 98% 96%  Weight:   107.4 kg   Height:        Intake/Output Summary (Last 24  hours) at 10/02/2022 0730 Last data filed at 10/02/2022 0600 Gross per 24 hour  Intake 1431.43 ml  Output 800 ml  Net 631.43 ml   Filed Weights   10/01/22 1514 10/02/22 0500  Weight: 105.5 kg 107.4 kg     Data Reviewed:    CBC: Recent Labs  Lab 10/01/22 0925 10/01/22 1038 10/02/22 0611  WBC  --  8.7 7.0  NEUTROABS  --  6.2 4.7  HGB 9.9* 9.6* 8.9*  HCT 29.0* 30.9* 29.4*  MCV  --  97.2 98.3  PLT  --  455* [id number]   Basic Metabolic Panel: Recent Labs  Lab 10/01/22 0913 10/01/22 0925 10/02/22 0611  NA 135 139 136  K 4.9 5.1 4.4  CL 111 111 111  CO2 20*  --  16*  GLUCOSE 109* 102* 88  BUN 42* 34* 28*  CREATININE 2.02* 2.10* 1.33*  CALCIUM 7.7*  --  7.9*  MG  --   --  2.4   GFR: Estimated Creatinine Clearance: 53.9 mL/min (A) (by C-G formula based on SCr of 1.33 mg/dL (H)). Liver Function Tests: Recent Labs  Lab 10/01/22 0913  AST 43*  ALT 20  ALKPHOS 118  BILITOT 0.5  PROT 5.8*  ALBUMIN 2.4*   No results for input(s): "LIPASE", "AMYLASE" in the last 168 hours. No results for input(s): "AMMONIA" in the last 168 hours. Coagulation Profile: Recent Labs  Lab 10/01/22 0913  INR 1.1   Cardiac Enzymes: No results for input(s): "CKTOTAL", "CKMB", "CKMBINDEX", "TROPONINI" in the last 168 hours. BNP (last 3 results) No results for input(s): "PROBNP" in the last 8760 hours. HbA1C: No results for input(s): "HGBA1C" in the last 72 hours. CBG: No results for input(s): "GLUCAP" in the last 168 hours. Lipid Profile: No results for input(s): "CHOL", "HDL", "LDLCALC", "TRIG", "CHOLHDL", "LDLDIRECT" in the last 72 hours. Thyroid Function Tests: No results for input(s): "TSH", "T4TOTAL", "FREET4", "T3FREE", "THYROIDAB" in the last 72 hours. Anemia Panel: No results for input(s): "VITAMINB12", "FOLATE", "FERRITIN", "TIBC", "IRON", "RETICCTPCT" in the last 72 hours. Sepsis Labs: Recent Labs  Lab 10/01/22 1038 10/01/22 2155  PROCALCITON  --  <0.10  LATICACIDVEN 1.0  --     Recent Results (from the past 240 hour(s))  Surgical pcr screen     Status: Abnormal   Collection Time: 09/24/22  4:37 PM   Specimen: Nasal Mucosa; Nasal Swab  Result Value Ref Range Status   MRSA, PCR NEGATIVE  NEGATIVE Final   Staphylococcus aureus POSITIVE (A) NEGATIVE Final    Comment: (NOTE) The Xpert SA Assay (FDA approved for NASAL specimens in patients 63 years of age and older), is one component of a comprehensive surveillance program. It is not intended to diagnose infection nor to guide or monitor treatment. Performed at [facility], [place] [address], [place], [place] [address]   MRSA Next Gen by PCR, Nasal     Status: None   Collection Time: 10/01/22  3:43 PM   Specimen: Nasal Mucosa; Nasal Swab  Result Value Ref Range Status   MRSA by PCR Next Gen NOT DETECTED NOT DETECTED Final    Comment: (NOTE) The GeneXpert MRSA Assay (FDA approved for NASAL specimens only), is one component of a comprehensive MRSA colonization surveillance program. It is not intended to diagnose MRSA infection nor to guide or monitor treatment for MRSA infections. Test performance is not FDA approved in patients less than 22 years old. Performed at [facility], [address] [place],  [place] [address]   Aerobic Culture w Gram Stain (superficial specimen)     Status: None (Preliminary result)   Collection Time: 10/01/22  6:10 PM   Specimen: Wound  Result Value Ref Range Status   Specimen Description   Final    WOUND GROIN RIGHT Performed at [facility] [address], [place], [place] [address]    Special Requests   Final    Immunocompromised Performed at [facility], [place] [address], [place], [place] [address]    Gram Stain   Final    RARE WBC PRESENT, PREDOMINANTLY MONONUCLEAR MODERATE GRAM POSITIVE COCCI IN PAIRS IN SINGLES FEW GRAM NEGATIVE RODS FEW GRAM POSITIVE RODS Performed at [facility], [place] [address], [place], [place] [address]    Culture PENDING  Incomplete   Report Status PENDING  Incomplete         Radiology Studies: VAS [place] LOWER EXTREMITY VENOUS (DVT)  Result Date: 10/01/2022  Lower Venous DVT Study  Patient Name:  Cheryel ANN Dansereau  Date of Exam:   10/01/2022 Medical Rec #: [id number]            Accession #:    [id number] Date of Birth: 08-13-1955           Patient Gender: F Patient Age:   15 years Exam Location:  [facility] Procedure:      VAS [place] LOWER EXTREMITY VENOUS (DVT) Referring Phys: Erin Hearing --------------------------------------------------------------------------------  Indications: Edema.  Risk Factors: Cancer. Limitations: Body habitus, poor ultrasound/tissue interface, bandages and patient pain tolerance, patient movement. Comparison Study: No prior studies. Performing Technologist: Oliver Hum RVT  Examination Guidelines: A complete evaluation includes B-mode imaging, spectral Doppler, color Doppler, and power Doppler as needed of all accessible portions of each vessel. Bilateral testing is considered an integral part of a complete examination. Limited examinations for reoccurring indications may be performed as noted. The reflux portion of the exam is performed with the patient in reverse Trendelenburg.  +---------+---------------+---------+-----------+----------+-------------------+ RIGHT    CompressibilityPhasicitySpontaneityPropertiesThrombus Aging      +---------+---------------+---------+-----------+----------+-------------------+ CFV                     No       No                   patency shown with                                                        color doppler       +---------+---------------+---------+-----------+----------+-------------------+ SFJ                                                   Not well visualized +---------+---------------+---------+-----------+----------+-------------------+ FV Prox                 Yes      Yes                                      +---------+---------------+---------+-----------+----------+-------------------+ FV Mid  Yes      Yes                                       +---------+---------------+---------+-----------+----------+-------------------+ FV Distal               Yes      Yes                                      +---------+---------------+---------+-----------+----------+-------------------+ PFV                                                   Not well visualized +---------+---------------+---------+-----------+----------+-------------------+ POP      Full           Yes      Yes                                      +---------+---------------+---------+-----------+----------+-------------------+ PTV      Full                                                             +---------+---------------+---------+-----------+----------+-------------------+ PERO                                                  Not well visualized +---------+---------------+---------+-----------+----------+-------------------+   +---------+---------------+---------+-----------+----------+--------------+ LEFT     CompressibilityPhasicitySpontaneityPropertiesThrombus Aging +---------+---------------+---------+-----------+----------+--------------+ CFV      Full           Yes      Yes                                 +---------+---------------+---------+-----------+----------+--------------+ SFJ      Full                                                        +---------+---------------+---------+-----------+----------+--------------+ FV Prox  Full                                                        +---------+---------------+---------+-----------+----------+--------------+ FV Mid   Full                                                        +---------+---------------+---------+-----------+----------+--------------+ FV DistalFull                                                        +---------+---------------+---------+-----------+----------+--------------+  PFV      Full                                                         +---------+---------------+---------+-----------+----------+--------------+ POP      Full           Yes      Yes                                 +---------+---------------+---------+-----------+----------+--------------+ PTV      Full                                                        +---------+---------------+---------+-----------+----------+--------------+ PERO     Full                                                        +---------+---------------+---------+-----------+----------+--------------+     Summary: RIGHT: - There is no evidence of deep vein thrombosis in the lower extremity. However, portions of this examination were limited- see technologist comments above.  - No cystic structure found in the popliteal fossa.  LEFT: - There is no evidence of deep vein thrombosis in the lower extremity. However, portions of this examination were limited- see technologist comments above.  - No cystic structure found in the popliteal fossa.  *See table(s) above for measurements and observations. Electronically signed by Deitra Mayo MD on 10/01/2022 at 6:02:29 PM.    Final    DG Chest Port 1 View  Result Date: 10/01/2022 CLINICAL DATA:  Weakness, found on ground EXAM: PORTABLE CHEST 1 VIEW COMPARISON:  Portable exam 1046 hours compared to 07/27/2021 FINDINGS: RIGHT jugular Port-A-Cath with tip projecting over SVC. Normal heart size, mediastinal contours, and pulmonary vascularity. Minimal subsegmental atelectasis at RIGHT base. Remaining lungs clear. No acute infiltrate, pleural effusion, pneumothorax, or acute osseous findings. Surgical clips LEFT breast and LEFT axilla. IMPRESSION: Minimal RIGHT basilar atelectasis. Electronically Signed   By: Lavonia Dana M.D.   On: 10/01/2022 10:56   CT ABDOMEN PELVIS WO CONTRAST  Result Date: 10/01/2022 CLINICAL DATA:  LEFT breast cancer. Oral chemotherapy ongoing. Evaluate for metastatic disease. * Tracking Code: BO * EXAM: CT  ABDOMEN AND PELVIS WITHOUT CONTRAST TECHNIQUE: Multidetector CT imaging of the abdomen and pelvis was performed following the standard protocol without IV contrast. RADIATION DOSE REDUCTION: This exam was performed according to the departmental dose-optimization program which includes automated exposure control, adjustment of the mA and/or kV according to patient size and/or use of iterative reconstruction technique. COMPARISON:  None Available. FINDINGS: Lower chest: Lung bases are clear. Round fluid collection in the LEFT breast presumed to represent postoperative seroma measuring 3 cm. Hepatobiliary: No focal hepatic lesion on noncontrast exam. Gallstone noted. No cholecystitis. Pancreas: Pancreas is normal. No ductal dilatation. No pancreatic inflammation. Spleen: Normal spleen Adrenals/urinary tract: Adrenal glands are normal. Persistent mild RIGHT hydronephrosis and hydroureter. Hydroureter extends to the RIGHT iliac vessels where  potential compression by the enlarged uterus. No change from prior other than stenting of the IVC the common iliac vessels Bladder normal. Stomach/Bowel: Small amount fluid esophagus. The stomach, duodenum, and small bowel normal. Normal appendix. The colon and rectosigmoid colon are normal. Vascular/Lymphatic: Mild intimal calcification of the aorta. No dilatation. Mild adenopathy drapes over the LEFT aspect of the aorta (image 41/2. No interval change. Interval stenting of the common iliac veins and proximal IVC. No pelvic lymphadenopathy Reproductive: Enlarged lobular uterus unchanged from prior. 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 Some fluid in the endometrial canal similar prior. Other: Again demonstrated significant edema of the proximal LEFT lower extremity. Musculoskeletal: No aggressive osseous lesion. IMPRESSION: 1. No significant interval change from CT 09/18/2022. No evidence of metastatic disease progression. 2. Interval stenting of the common iliac veins and  proximal inferior vena cava. Persistent mild RIGHT hydronephrosis and hydroureter. Persistent RIGHT lower extremity edema. 3. Mild periaortic retroperitoneal adenopathy not changed from prior. 4. Stable enlarged uterus. 5. Postoperative seroma in the LEFT breast Electronically Signed   By: Suzy Bouchard M.D.   On: 10/01/2022 10:33        Scheduled Meds:  brimonidine  1 drop Both Eyes BID   Chlorhexidine Gluconate Cloth  6 each Topical Daily   enoxaparin (LOVENOX) injection  40 mg Subcutaneous Q24H   gabapentin  600 mg Oral 2 times per day   And   gabapentin  900 mg Oral QHS   insulin aspart  0-9 Units Subcutaneous TID WC   midodrine  5 mg Oral TID WC   sodium chloride flush  3 mL Intravenous Q12H   Continuous Infusions:  albumin human 25 g (10/02/22 0530)   ceFEPime (MAXIPIME) IV Stopped (10/01/22 2147)   lactated ringers 100 mL/hr at 10/01/22 2322   metronidazole Stopped (10/01/22 1939)     LOS: 1 day   Time spent= 35 mins    Christiana Gurevich Arsenio Loader, MD [facility]  If 7PM-7AM, please contact night-coverage  10/02/2022, 7:30 AM

## 2022-10-02 NOTE — Progress Notes (Signed)
Report given. Pt transferred to [facility]. Pt stable

## 2022-10-02 NOTE — Progress Notes (Signed)
Kristin Ward   DOB:06/21/1956   [id number]   [id number]  Hem/onc follow up   Subjective: Patient is well-known to me, under my care for her metastatic breast cancer.  She was admitted yesterday for hypotension and AKI.  Her right lower extremity edema is still severe, but patient denies significant pain.  She is able to walk with cane or walker at home.  Objective:  Vitals:   10/02/22 1400 10/02/22 1548  BP: 103/71 128/63  Pulse: 97 94  Resp: (!) 39 17  Temp:  98.7 F (37.1 C)  SpO2: 95% 98%    Body mass index is 37.08 kg/m.  Intake/Output Summary (Last 24 hours) at 10/02/2022 1552 Last data filed at 10/02/2022 1401 Gross per 24 hour  Intake 3300.86 ml  Output 1550 ml  Net 1750.86 ml     Sclerae unicteric  Oropharynx clear  No peripheral adenopathy  Lungs clear -- no rales or rhonchi  Heart regular rate and rhythm  Abdomen benign  MSK no focal spinal tenderness, (+) significant right lower extremity edema with soft tissue firmness from foot to right groin. (+) a few small shallow ulcers in the right groin area with foul smells.  Neuro nonfocal    CBG (last 3)  No results for input(s): "GLUCAP" in the last 72 hours.    Labs:  Urine Studies No results for input(s): "UHGB", "CRYS" in the last 72 hours.  Invalid input(s): "UACOL", "UAPR", "USPG", "UPH", "UTP", "UGL", "UKET", "UBIL", "UNIT", "UROB", "ULEU", "UEPI", "UWBC", "URBC", "UBAC", "CAST", "UCOM", "BILUA"  Basic Metabolic Panel: Recent Labs  Lab 10/01/22 0913 10/01/22 0925 10/02/22 0611  NA 135 139 136  K 4.9 5.1 4.4  CL 111 111 111  CO2 20*  --  16*  GLUCOSE 109* 102* 88  BUN 42* 34* 28*  CREATININE 2.02* 2.10* 1.33*  CALCIUM 7.7*  --  7.9*  MG  --   --  2.4   GFR Estimated Creatinine Clearance: 53.9 mL/min (A) (by C-G formula based on SCr of 1.33 mg/dL (H)). Liver Function Tests: Recent Labs  Lab 10/01/22 0913  AST 43*  ALT 20  ALKPHOS 118  BILITOT 0.5  PROT 5.8*  ALBUMIN 2.4*    No results for input(s): "LIPASE", "AMYLASE" in the last 168 hours. No results for input(s): "AMMONIA" in the last 168 hours. Coagulation profile Recent Labs  Lab 10/01/22 0913  INR 1.1    CBC: Recent Labs  Lab 10/01/22 0925 10/01/22 1038 10/02/22 0611  WBC  --  8.7 7.0  NEUTROABS  --  6.2 4.7  HGB 9.9* 9.6* 8.9*  HCT 29.0* 30.9* 29.4*  MCV  --  97.2 98.3  PLT  --  455* 387   Cardiac Enzymes: No results for input(s): "CKTOTAL", "CKMB", "CKMBINDEX", "TROPONINI" in the last 168 hours. BNP: Invalid input(s): "POCBNP" CBG: No results for input(s): "GLUCAP" in the last 168 hours.  D-Dimer No results for input(s): "DDIMER" in the last 72 hours. Hgb A1c No results for input(s): "HGBA1C" in the last 72 hours.  Lipid Profile No results for input(s): "CHOL", "HDL", "LDLCALC", "TRIG", "CHOLHDL", "LDLDIRECT" in the last 72 hours. Thyroid function studies No results for input(s): "TSH", "T4TOTAL", "T3FREE", "THYROIDAB" in the last 72 hours.  Invalid input(s): "FREET3" Anemia work up No results for input(s): "VITAMINB12", "FOLATE", "FERRITIN", "TIBC", "IRON", "RETICCTPCT" in the last 72 hours. Microbiology Recent Results (from the past 240 hour(s))  Surgical pcr screen     Status: Abnormal   Collection Time: 09/24/22  4:37 PM   Specimen: Nasal Mucosa; Nasal Swab  Result Value Ref Range Status   MRSA, PCR NEGATIVE NEGATIVE Final   Staphylococcus aureus POSITIVE (A) NEGATIVE Final    Comment: (NOTE) The Xpert SA Assay (FDA approved for NASAL specimens in patients 90 years of age and older), is one component of a comprehensive surveillance program. It is not intended to diagnose infection nor to guide or monitor treatment. Performed at [facility], [place] [address], [place], [place] [address]   Urine Culture (for pregnant, neutropenic or urologic patients or patients with an indwelling urinary catheter)     Status: None   Collection Time: 10/01/22  2:06 PM    Specimen: Urine, Catheterized  Result Value Ref Range Status   Specimen Description   Final    URINE, CATHETERIZED Performed at [place] [address], [place], [place] [address]    Special Requests   Final    NONE Performed at [facility], [place] [address] [place], [place] [address]    Culture   Final    NO GROWTH Performed at [facility], [place] [address], [place], [place] [address]    Report Status 10/02/2022 FINAL  Final  MRSA Next Gen by PCR, Nasal     Status: None   Collection Time: 10/01/22  3:43 PM   Specimen: Nasal Mucosa; Nasal Swab  Result Value Ref Range Status   MRSA by PCR Next Gen NOT DETECTED NOT DETECTED Final    Comment: (NOTE) The GeneXpert MRSA Assay (FDA approved for NASAL specimens only), is one component of a comprehensive MRSA colonization surveillance program. It is not intended to diagnose MRSA infection nor to guide or monitor treatment for MRSA infections. Test performance is not FDA approved in patients less than 3 years old. Performed at [facility], [place] [address] [place], [place] [address]   Aerobic Culture w Gram Stain (superficial specimen)     Status: None (Preliminary result)   Collection Time: 10/01/22  6:10 PM   Specimen: Wound  Result Value Ref Range Status   Specimen Description   Final    WOUND GROIN RIGHT Performed at [place] [address] [place], [place] [address]    Special Requests   Final    Immunocompromised Performed at [facility], [place], [place] [address] [place], [place] [address]    Gram Stain   Final    RARE WBC PRESENT, PREDOMINANTLY MONONUCLEAR MODERATE GRAM POSITIVE COCCI IN PAIRS IN SINGLES FEW GRAM NEGATIVE RODS FEW GRAM POSITIVE RODS    Culture   Final    TOO YOUNG TO READ Performed at [facility], [place] [address], [place], [place] [address]    Report Status PENDING  Incomplete       Studies:  VAS [place] LOWER EXTREMITY VENOUS (DVT)  Result Date: 10/01/2022  Lower Venous DVT Study Patient Name:  Kristin Ward  Date of Exam:   10/01/2022 Medical Rec #: [id number]            Accession #:    [id number] Date of Birth: 1955/10/16           Patient Gender: F Patient Age:   57 years Exam Location:  [facility] Procedure:      VAS [place] LOWER EXTREMITY VENOUS (DVT) Referring Phys: Erin Hearing --------------------------------------------------------------------------------  Indications: Edema.  Risk Factors: Cancer. Limitations: Body habitus, poor ultrasound/tissue interface, bandages and patient pain  tolerance, patient movement. Comparison Study: No prior studies. Performing Technologist: Oliver Hum RVT  Examination Guidelines: A complete evaluation includes B-mode imaging, spectral Doppler, color Doppler, and power Doppler as needed of all accessible portions of each vessel. Bilateral testing is considered an integral part of a complete examination. Limited examinations for reoccurring indications may be performed as noted. The reflux portion of the exam is performed with the patient in reverse Trendelenburg.  +---------+---------------+---------+-----------+----------+-------------------+ RIGHT    CompressibilityPhasicitySpontaneityPropertiesThrombus Aging      +---------+---------------+---------+-----------+----------+-------------------+ CFV                     No       No                   patency shown with                                                        color doppler       +---------+---------------+---------+-----------+----------+-------------------+ SFJ                                                   Not well visualized +---------+---------------+---------+-----------+----------+-------------------+ FV Prox                 Yes      Yes                                       +---------+---------------+---------+-----------+----------+-------------------+ FV Mid                  Yes      Yes                                      +---------+---------------+---------+-----------+----------+-------------------+ FV Distal               Yes      Yes                                      +---------+---------------+---------+-----------+----------+-------------------+ PFV                                                   Not well visualized +---------+---------------+---------+-----------+----------+-------------------+ POP      Full           Yes      Yes                                      +---------+---------------+---------+-----------+----------+-------------------+ PTV      Full                                                             +---------+---------------+---------+-----------+----------+-------------------+  PERO                                                  Not well visualized +---------+---------------+---------+-----------+----------+-------------------+   +---------+---------------+---------+-----------+----------+--------------+ LEFT     CompressibilityPhasicitySpontaneityPropertiesThrombus Aging +---------+---------------+---------+-----------+----------+--------------+ CFV      Full           Yes      Yes                                 +---------+---------------+---------+-----------+----------+--------------+ SFJ      Full                                                        +---------+---------------+---------+-----------+----------+--------------+ FV Prox  Full                                                        +---------+---------------+---------+-----------+----------+--------------+ FV Mid   Full                                                        +---------+---------------+---------+-----------+----------+--------------+ FV DistalFull                                                         +---------+---------------+---------+-----------+----------+--------------+ PFV      Full                                                        +---------+---------------+---------+-----------+----------+--------------+ POP      Full           Yes      Yes                                 +---------+---------------+---------+-----------+----------+--------------+ PTV      Full                                                        +---------+---------------+---------+-----------+----------+--------------+ PERO     Full                                                        +---------+---------------+---------+-----------+----------+--------------+  Summary: RIGHT: - There is no evidence of deep vein thrombosis in the lower extremity. However, portions of this examination were limited- see technologist comments above.  - No cystic structure found in the popliteal fossa.  LEFT: - There is no evidence of deep vein thrombosis in the lower extremity. However, portions of this examination were limited- see technologist comments above.  - No cystic structure found in the popliteal fossa.  *See table(s) above for measurements and observations. Electronically signed by Deitra Mayo MD on 10/01/2022 at 6:02:29 PM.    Final    DG Chest Port 1 View  Result Date: 10/01/2022 CLINICAL DATA:  Weakness, found on ground EXAM: PORTABLE CHEST 1 VIEW COMPARISON:  Portable exam 1046 hours compared to 07/27/2021 FINDINGS: RIGHT jugular Port-A-Cath with tip projecting over SVC. Normal heart size, mediastinal contours, and pulmonary vascularity. Minimal subsegmental atelectasis at RIGHT base. Remaining lungs clear. No acute infiltrate, pleural effusion, pneumothorax, or acute osseous findings. Surgical clips LEFT breast and LEFT axilla. IMPRESSION: Minimal RIGHT basilar atelectasis. Electronically Signed   By: Lavonia Dana M.D.   On: 10/01/2022 10:56   CT ABDOMEN PELVIS WO  CONTRAST  Result Date: 10/01/2022 CLINICAL DATA:  LEFT breast cancer. Oral chemotherapy ongoing. Evaluate for metastatic disease. * Tracking Code: BO * EXAM: CT ABDOMEN AND PELVIS WITHOUT CONTRAST TECHNIQUE: Multidetector CT imaging of the abdomen and pelvis was performed following the standard protocol without IV contrast. RADIATION DOSE REDUCTION: This exam was performed according to the departmental dose-optimization program which includes automated exposure control, adjustment of the mA and/or kV according to patient size and/or use of iterative reconstruction technique. COMPARISON:  None Available. FINDINGS: Lower chest: Lung bases are clear. Round fluid collection in the LEFT breast presumed to represent postoperative seroma measuring 3 cm. Hepatobiliary: No focal hepatic lesion on noncontrast exam. Gallstone noted. No cholecystitis. Pancreas: Pancreas is normal. No ductal dilatation. No pancreatic inflammation. Spleen: Normal spleen Adrenals/urinary tract: Adrenal glands are normal. Persistent mild RIGHT hydronephrosis and hydroureter. Hydroureter extends to the RIGHT iliac vessels where potential compression by the enlarged uterus. No change from prior other than stenting of the IVC the common iliac vessels Bladder normal. Stomach/Bowel: Small amount fluid esophagus. The stomach, duodenum, and small bowel normal. Normal appendix. The colon and rectosigmoid colon are normal. Vascular/Lymphatic: Mild intimal calcification of the aorta. No dilatation. Mild adenopathy drapes over the LEFT aspect of the aorta (image 41/2. No interval change. Interval stenting of the common iliac veins and proximal IVC. No pelvic lymphadenopathy Reproductive: Enlarged lobular uterus unchanged from prior. 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 Some fluid in the endometrial canal similar prior. Other: Again demonstrated significant edema of the proximal LEFT lower extremity. Musculoskeletal: No aggressive osseous lesion.  IMPRESSION: 1. No significant interval change from CT 09/18/2022. No evidence of metastatic disease progression. 2. Interval stenting of the common iliac veins and proximal inferior vena cava. Persistent mild RIGHT hydronephrosis and hydroureter. Persistent RIGHT lower extremity edema. 3. Mild periaortic retroperitoneal adenopathy not changed from prior. 4. Stable enlarged uterus. 5. Postoperative seroma in the LEFT breast Electronically Signed   By: Suzy Bouchard M.D.   On: 10/01/2022 10:33    Assessment: 67 y.o. 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  Stents in iliac vein are patent, no bulky adenopathy.  I am still puzzled what caused her significant lymphedema of the right lower extremity.  She previously received radiation to right groin area for adenopathy and edema which she had a good response.  I will reach out to Dr. Lisbeth Renshaw again, to see if there are any benefit for re-radiation.  -we have postponed her chemo (scheduled for tomorrow) to next week.  -no additional oncological workup or treatment planned.  I will follow-up as needed. Discharge per primary team.   [name], MD 10/02/2022

## 2022-10-02 NOTE — Progress Notes (Signed)
Pt refusing cbg checks & tempt checks. Dr. Reesa Chew aware.

## 2022-10-02 NOTE — Evaluation (Signed)
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  Patient presents with presyncopal symptoms and hypotension.   Clinical Impression   [name] is a 67 year old woman who presents with RLE lymphedema and pain. ON  evaluation she presents with good upper body strength, baseline visual deficits but pain limiting mobility. She was mod I for bed mobility and min guard for pivots to [facility] and recliner. She needed increased assist for ADLs - limited by pain in RLE and lines/leans. She lacks some safety awareness but overall cognition is good - is alert and oriented and can follow commands. Patient will benefit from skilled OT services while in hospital to improve deficits and learn compensatory strategies as needed in order to return to PLOF.  Do not expect she will need OT services at discharge.      Recommendations for follow up therapy are one component of a multi-disciplinary discharge planning process, led by the attending physician.  Recommendations may be updated based on patient status, additional functional criteria and insurance authorization.   Follow Up Recommendations  No OT follow up     Assistance Recommended at Discharge Intermittent Supervision/Assistance  Patient can return home with the following Direct supervision/assist for financial management;Direct supervision/assist for medications management;Assistance with cooking/housework;Help with stairs or ramp for entrance    Functional Status Assessment  Patient has had a recent decline in their  functional status and demonstrates the ability to make significant improvements in function in a reasonable and predictable amount of time.  Equipment Recommendations  None recommended by OT    Recommendations for Other Services       Precautions / Restrictions Precautions Precautions: Fall Precaution Comments: visual deficits in both eyes Required Braces or Orthoses: Sling Restrictions Weight Bearing Restrictions: No      Mobility Bed Mobility Overal bed mobility: Needs Assistance Bed Mobility: Supine to Sit     Supine to sit: Supervision          Transfers Overall transfer level: Needs assistance Equipment used: None Transfers: Sit to/from Stand, Bed to chair/wheelchair/BSC Sit to Stand: Min guard   Squat pivot transfers: Min guard              Balance Overall balance assessment: Needs assistance Sitting-balance support: No upper extremity supported, Feet supported Sitting balance-Leahy Scale: Good     Standing balance support: During functional activity, Reliant on assistive device for balance, Bilateral upper extremity supported Standing balance-Leahy Scale: Poor                             ADL either performed or assessed with clinical judgement   ADL Overall ADL's : Needs assistance/impaired Eating/Feeding: Independent   Grooming: Set up;Sitting   Upper Body Bathing: Set up;Sitting   Lower Body Bathing: Minimal assistance;Sit to/from stand   Upper Body Dressing : Set up;Sitting   Lower Body Dressing: Minimal assistance;Sit to/from stand   Toilet Transfer: Min guard;BSC/3in1   Toileting- Water quality scientist and Hygiene: Moderate assistance;Sit to/from stand Toileting - Clothing Manipulation Details (indicate cue type and reason): for perianal care  Functional mobility during ADLs: Min guard       Vision Baseline Vision/History: 1 Wears glasses Ability to See in Adequate Light: 2 Moderately impaired Patient Visual  Report: No change from baseline Additional Comments: Can see better in L eye during the day, Better in Right eye at night. Has right sided peripheral vision loss (significant), and decreased on the left.     Perception     Praxis      Pertinent Vitals/Pain Pain Assessment Pain Assessment: Faces Faces Pain Scale: Hurts little more Pain Location: RLE Pain Descriptors / Indicators: Grimacing, Aching, Throbbing Pain Intervention(s): Limited activity within patient's tolerance     Hand Dominance Right   Extremity/Trunk Assessment Upper Extremity Assessment Upper Extremity Assessment: Overall WFL for tasks assessed   Lower Extremity Assessment Lower Extremity Assessment: Defer to PT evaluation   Cervical / Trunk Assessment Cervical / Trunk Assessment: Kyphotic   Communication Communication Communication: No difficulties   Cognition Arousal/Alertness: Awake/alert Behavior During Therapy: WFL for tasks assessed/performed Overall Cognitive Status: Within Functional Limits for tasks assessed                                 General Comments: Alert to self, place, situation and date.     General Comments       Exercises     Shoulder Instructions      Home Living Family/patient expects to be discharged to:: Private residence Living Arrangements: Children Available Help at Discharge: Family Type of Home: House Home Access: Stairs to enter Technical brewer of Steps: 6 Entrance Stairs-Rails: Can reach both Plum: One level     Bathroom Shower/Tub: Teacher, early years/pre: [place]: Conservation officer, nature (2 wheels);Cane - single point   Additional Comments: Son is at home - he works remote      Prior Functioning/Environment Prior Level of Function : Independent/Modified Independent             Mobility Comments: uses a  walker but mostly a cane. can't stand for long. new RLE edema x 1 month getting PT           OT Problem List: Decreased activity tolerance;Obesity;Increased edema;Pain;Impaired balance (sitting and/or standing);Decreased knowledge of use of DME or AE;Decreased safety awareness      OT Treatment/Interventions: Self-care/ADL training;Therapeutic exercise;DME and/or AE instruction;Therapeutic activities;Balance training;Patient/family education    OT Goals(Current goals can be found in the care plan section) Acute Rehab OT Goals Patient Stated Goal: be able to get up more OT Goal Formulation: With patient Time For Goal Achievement: 10/16/22 Potential to Achieve Goals: Good  OT Frequency: Min 2X/week    Co-evaluation              AM-PAC OT "6 Clicks" Daily Activity     Outcome Measure Help from another person eating meals?: None Help from another person taking care of personal grooming?: A Little Help from another person toileting, which includes using toliet, bedpan, or urinal?: A Lot Help from another person bathing (including washing, rinsing, drying)?: A Little Help from another person to put on and taking off regular upper body clothing?: A Little Help from another person to put on and taking off regular lower body clothing?: A Little 6 Click Score: 18   End of Session Nurse Communication: Mobility status  Activity Tolerance: Patient tolerated treatment well Patient left: in chair;with call bell/phone within reach;with chair  alarm set  OT Visit Diagnosis: Pain                Time: [id number] OT Time Calculation (min): 24 min Charges:  OT General Charges $OT Visit: 1 Visit OT Evaluation $OT Eval Low Complexity: 1 Low OT Treatments $Self Care/Home Management : 8-22 mins  Gustavo Lah, OTR/L [place]  Office [phone]   Lenward Chancellor 10/02/2022, 3:25 PM

## 2022-10-02 NOTE — Progress Notes (Signed)
Patient refused blood glucose checked at 2200. RN educated patient and she verbally stated that she refused and understood what happened for her decision.

## 2022-10-02 NOTE — Evaluation (Addendum)
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 Patient presents with presyncopal symptoms and hypotension. 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 At baseline, pt is independent.  She has been limited by R LE edema this last month but was still ambulatory with RW or cane.  Pt has DME and home support.  Today, pt was min guard/supervision for transfers and ambulated 65' with RW.  Did require min cues for safety with fatigue.  All VSS during session, negative orthostatic BP.  Pt currently with functional limitations due to the deficits listed below (see PT Problem List). Pt will benefit from skilled PT to increase their independence and safety with mobility to allow discharge to the venue listed below.      BP supine 117/99, sit 136/93, stand 134/89     Recommendations for follow up therapy are one component of a multi-disciplinary discharge planning process, led by the attending physician.  Recommendations may be updated based on patient status, additional functional criteria and insurance authorization.  Follow Up Recommendations Outpatient PT (resume - pt at [facility])      Assistance Recommended at Discharge Intermittent Supervision/Assistance  Patient can return home with the following  A little help with walking and/or transfers;A little help with bathing/dressing/bathroom;Assistance with cooking/housework;Help with stairs or ramp for entrance    Equipment Recommendations None recommended by PT   Recommendations for Other Services       Functional Status Assessment Patient has had a recent decline in their functional status and demonstrates the ability to make significant improvements in function in a reasonable and predictable amount of time.     Precautions / Restrictions Precautions Precautions: Fall Precaution Comments: visual deficits in both eyes Required Braces or Orthoses: Sling Restrictions Weight Bearing Restrictions: No      Mobility  Bed Mobility Overal bed mobility: Needs Assistance Bed Mobility: Supine to Sit, Sit to Supine     Supine to sit: Supervision, HOB elevated Sit to supine: Supervision, HOB elevated   General bed mobility comments: increased time , use of rails, use of momentum and bil UE to assist R LE    Transfers Overall transfer level: Needs assistance Equipment used: None Transfers: Sit to/from Stand Sit to Stand: Min guard                Ambulation/Gait Ambulation/Gait assistance: Min guard Gait Distance (Feet): 80 Feet Assistive device: Rolling walker (2 wheels) Gait Pattern/deviations: Step-through pattern, Decreased stride length Gait velocity: decreased     General Gait Details: With fatigue pt began to hurry and pushed RW too far forward - cues to take time and for RW proximity  Stairs            Wheelchair Mobility    Modified Rankin (Stroke Patients Only)       Balance Overall balance assessment: Needs assistance Sitting-balance support: No upper extremity supported, Feet supported Sitting balance-Leahy Scale: Good     Standing balance support: During functional activity, Reliant on assistive device for balance, Bilateral upper extremity supported Standing balance-Leahy Scale: Poor Standing balance comment: steady but with RW  Pertinent Vitals/Pain Pain Assessment Pain Assessment: No/denies pain    Home Living Family/patient expects to be discharged to::  Private residence Living Arrangements: Children Available Help at Discharge: Family;Available 24 hours/day Type of Home: House Home Access: Stairs to enter Entrance Stairs-Rails: Can reach both Entrance Stairs-Number of Steps: 6   Home Layout: One level Home Equipment: Conservation officer, nature (2 wheels);Cane - single point Additional Comments: Son is at home - he works remote    Prior Function Prior Level of Function : Independent/Modified Independent             Mobility Comments: Uses a cane in house and walker for short community distances.  Can't stand for long now with new R LE edema about 1 month getting outpt PT and getting fit for compression sleeve ADLs Comments: Independent with adls     Hand Dominance   Dominant Hand: Right    Extremity/Trunk Assessment   Upper Extremity Assessment Upper Extremity Assessment: Overall WFL for tasks assessed    Lower Extremity Assessment Lower Extremity Assessment: RLE deficits/detail;LLE deficits/detail RLE Deficits / Details: Severe edema that limited ROM but was functional.  MMT: hip 2/5, knee 3/5, ankle 5/5 LLE Deficits / Details: ROM WFL; MMT 5/5    Cervical / Trunk Assessment Cervical / Trunk Assessment: Kyphotic  Communication   Communication: No difficulties  Cognition Arousal/Alertness: Awake/alert Behavior During Therapy: WFL for tasks assessed/performed Overall Cognitive Status: Within Functional Limits for tasks assessed                                          General Comments General comments (skin integrity, edema, etc.): HR 109 bpm and sats 100% with activity.  BP supine 117/99, sit 136/93, stand 134/89    Exercises Other Exercises Other Exercises: Pt encouraged to perform ankle pumps to assist with edema management.  Pt also demonstrated lymph drainage massage that she was taught in outpt PT   Assessment/Plan    PT Assessment Patient needs continued PT services  PT Problem List Decreased  strength;Pain;Decreased range of motion;Decreased activity tolerance;Decreased balance;Decreased mobility;Decreased knowledge of precautions;Decreased knowledge of use of DME;Decreased skin integrity       PT Treatment Interventions DME instruction;Therapeutic exercise;Gait training;Stair training;Functional mobility training;Therapeutic activities;Patient/family education;Balance training;Modalities    PT Goals (Current goals can be found in the Care Plan section)  Acute Rehab PT Goals Patient Stated Goal: return home PT Goal Formulation: With patient/family Time For Goal Achievement: 10/16/22 Potential to Achieve Goals: Good    Frequency Min 3X/week     Co-evaluation               AM-PAC PT "6 Clicks" Mobility  Outcome Measure Help needed turning from your back to your side while in a flat bed without using bedrails?: A Little Help needed moving from lying on your back to sitting on the side of a flat bed without using bedrails?: A Little Help needed moving to and from a bed to a chair (including a wheelchair)?: A Little Help needed standing up from a chair using your arms (e.g., wheelchair or bedside chair)?: A Little Help needed to walk in hospital room?: A Little Help needed climbing 3-5 steps with a railing? : A Little 6 Click Score: 18    End of Session Equipment Utilized During Treatment: Gait belt Activity Tolerance: Patient tolerated treatment well Patient left: in bed;with call bell/phone within reach;with  bed alarm set Nurse Communication: Mobility status PT Visit Diagnosis: Other abnormalities of gait and mobility (R26.89);Muscle weakness (generalized) (M62.81)    Time: [id number] PT Time Calculation (min) (ACUTE ONLY): 20 min   Charges:   PT Evaluation $PT Eval Low Complexity: 1 Low          Nikko Goldwire, PT Acute Rehab [facility] [phone]   Karlton Lemon 10/02/2022, 4:54 PM

## 2022-10-03 ENCOUNTER — Inpatient Hospital Stay: Payer: 59

## 2022-10-03 ENCOUNTER — Inpatient Hospital Stay: Payer: 59 | Admitting: Nurse Practitioner

## 2022-10-03 DIAGNOSIS — R55 Syncope and collapse: Secondary | ICD-10-CM | POA: Diagnosis not present

## 2022-10-03 LAB — CBC
HCT: 29.9 % — ABNORMAL LOW (ref 36.0–46.0)
Hemoglobin: 9.5 g/dL — ABNORMAL LOW (ref 12.0–15.0)
MCH: 29.9 pg (ref 26.0–34.0)
MCHC: 31.8 g/dL (ref 30.0–36.0)
MCV: 94 fL (ref 80.0–100.0)
Platelets: 400 10*3/uL (ref 150–400)
RBC: 3.18 MIL/uL — ABNORMAL LOW (ref 3.87–5.11)
RDW: 15.1 % (ref 11.5–15.5)
WBC: 7.8 10*3/uL (ref 4.0–10.5)
nRBC: 0 % (ref 0.0–0.2)

## 2022-10-03 LAB — BASIC METABOLIC PANEL
Anion gap: 7 (ref 5–15)
BUN: 21 mg/dL (ref 8–23)
CO2: 19 mmol/L — ABNORMAL LOW (ref 22–32)
Calcium: 8.3 mg/dL — ABNORMAL LOW (ref 8.9–10.3)
Chloride: 110 mmol/L (ref 98–111)
Creatinine, Ser: 1.19 mg/dL — ABNORMAL HIGH (ref 0.44–1.00)
GFR, Estimated: 51 mL/min — ABNORMAL LOW (ref >60)
Glucose, Bld: 86 mg/dL (ref 70–99)
Potassium: 4.2 mmol/L (ref 3.5–5.1)
Sodium: 136 mmol/L (ref 135–145)

## 2022-10-03 LAB — MAGNESIUM: Magnesium: 2.5 mg/dL — ABNORMAL HIGH (ref 1.7–2.4)

## 2022-10-03 MED ORDER — LEVOFLOXACIN 500 MG PO TABS: 500.0000 mg | ORAL_TABLET | Freq: Every day | ORAL | 0 refills | Status: AC

## 2022-10-03 MED ORDER — HEPARIN SOD (PORK) LOCK FLUSH 100 UNIT/ML IV SOLN
500.0000 [IU] | INTRAVENOUS | Status: AC | PRN
  Administered 2022-10-03: 500 [IU]

## 2022-10-03 NOTE — Progress Notes (Signed)
Mobility Specialist - Progress Note   10/03/22 0911  Mobility  Activity Ambulated with assistance to bathroom  Level of Assistance Standby assist, set-up cues, supervision of patient - no hands on  Assistive Device Front wheel walker  Distance Ambulated (ft) 10 ft  Range of Motion/Exercises Active  Activity Response Tolerated fair  $Mobility charge 1 Mobility   Pt was found in bathroom needing assistance. Pt was assisted with peri care and stated being too tired to ambulate at the moment. Pt was advised to use RW to ambulate back to bed. Was left in bed with necessities in reach and bed alarm on. RN notified of session.  Ferd Hibbs Mobility Specialist

## 2022-10-03 NOTE — Discharge Summary (Signed)
Physician Discharge Summary  Kristin Ward [id number] DOB: 09/09/1955 DOA: 10/01/2022  PCP: [name], MD  Admit date: 10/01/2022 Discharge date: 10/03/2022  Admitted From: Home Disposition:  Home  Recommendations for Outpatient Follow-up:  Follow up with PCP in 1-2 weeks Please obtain BMP/CBC in one week your next doctors visit.  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 Patient is not on anticoagulation presented back to cancer center for follow-up imaging and was noted to have possible presyncopal symptoms and hypotension.  Patient was clinically dehydrated in the hospital requiring IV fluids.  Over the course of 48 hours she significantly improved.  Medically stable for discharge.   Presyncopal versus syncopal/hypotension Dehydration -Upon admission hypotensive concerns of dehydration versus possible infection.  UA, chest x-ray negative.  Procalcitonin is negative.  Blood pressure is stable today.   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 Burr Medico on chronic pain medications.  Plans for outpatient chemotherapy and follow-up next week -Neurontin   Severe right lower extremity edema secondary to metastatic lesion compressing iliac vein and pelvis - Status post recent venogram and stent placement by IR on 2/13 follow-up CT stable. -Lower extremity Dopplers-negative for DVT -Compression stockings   Acute kidney injury on CKD stage IIIa, improved Right-sided  hydronephrosis - Baseline creatinine 1.2.  Admission creatinine 2.1, getting IV fluids.  Creatinine 1.3 - Nephrotoxic drugs on hold -Hydronephrosis but asymptomatic seen on previous scan.  Dr. Burr Medico had discussed with Dr. 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  On Jardiance   Hypertension - Now stable, resume home meds   Hyperlipidemia -Statin   Right groin wound. -Wound care ordered by admitting provider.  Cultures grew gram-negative rods.  Seen by wound care.  Will discharge on 5 days of p.o. Levaquin and have her follow-up outpatient    Discharge Diagnoses:  Principal Problem:   Syncope and collapse Active Problems:   Hyperlipidemia   Edema of right lower extremity   Hypotension   AKI (acute kidney injury) ([place])   Dehydration   Malignant neoplasm of lower-outer quadrant of left female breast [facility])      Consultations: Oncology  Subjective: No complaints.  Feels better, wants to go home.  Discharge Exam: Vitals:   10/02/22 2130 10/03/22 0500  BP: 110/76 119/84  Pulse: 98 98  Resp: 20 20  Temp: 98.4 F (36.9 C) 99 F (37.2 C)  SpO2: 97% 98%   Vitals:   10/02/22 1400 10/02/22 1548 10/02/22 2130 10/03/22 0500  BP: 103/71 128/63 110/76 119/84  Pulse: 97 94 98 98  Resp: (!) 39 17 20 20  $ Temp:  98.7 F (37.1 C) 98.4 F (36.9 C) 99 F (37.2 C)  TempSrc:  Oral  Oral  SpO2: 95% 98% 97% 98%  Weight:  111.3 kg  Height:        General: Pt is alert, awake, not in acute distress Cardiovascular: RRR, S1/S2 +, no rubs, no gallops Respiratory: CTA bilaterally, no wheezing, no rhonchi Abdominal: Soft, NT, ND, bowel sounds + Extremities: no  edema, no cyanosis  Right chest wall catheter Metastatic lesion in the right groin, seen by wound care  Discharge Instructions   Allergies as of 10/03/2022       Reactions   Morphine And Related Nausea And Vomiting   Latex Hives, Itching, Other (See Comments)   Burning, too   Tylenol With Codeine #3 [acetaminophen-codeine] Nausea And Vomiting   Penicillins Nausea Only, Swelling, Rash   Has patient had a PCN reaction causing immediate rash, facial/tongue/throat swelling, SOB or lightheadedness with hypotension: Yes Has patient had a PCN reaction causing severe rash involving mucus membranes or skin necrosis: Yes Has patient had a PCN reaction that required hospitalization: No Has patient had a PCN reaction occurring within the last 10 years: No TONGUE SWELLING AND RASH AROUND MOUTH If all of the above answers are "NO", then may proceed with Cephalosporin use.        Medication List     STOP taking these medications    ibuprofen 800 MG tablet Commonly known as: ADVIL       TAKE these medications    Accu-Chek FastClix Lancets Misc TEST TWICE A DAY. PT USES AN ACCU-CHEK GUIDE ME METER   Accu-Chek Softclix Lancet Dev Kit 1 Units by Does not apply route 2 (two) times daily. Check FSBS BID - Include strips # 50 with 5 refills, Lancets #50 with 5 refills DX: Type 2 DM - ICD 10: E11.9   amLODipine 5 MG tablet Commonly known as: NORVASC Take 5 mg by mouth daily as needed (HBP).   aspirin 81 MG chewable tablet Chew 81 mg by mouth daily.   atorvastatin 40 MG tablet Commonly known as: LIPITOR Take 1 tablet (40 mg total) by mouth daily.   brimonidine 0.2 % ophthalmic solution Commonly known as: ALPHAGAN Place 1 drop into both eyes 2 (two) times daily.   carvedilol 12.5 MG tablet Commonly known as: COREG TAKE 1 AND 1/2 TABLETS BY MOUTH TWICE DAILY qm What changed: See the new instructions.   cyanocobalamin 1000 MCG tablet Commonly known as: VITAMIN B12 Take 1,000  mcg by mouth daily.   empagliflozin 10 MG Tabs tablet Commonly known as: Jardiance Take 1 tablet (10 mg total) by mouth daily before breakfast.   furosemide 20 MG tablet Commonly known as: LASIX Take 40 mg by mouth daily as needed for fluid (weight gain of 3 -4 lbs).   gabapentin 300 MG capsule Commonly known as: NEURONTIN Take 3 caps twice a day during the day and 4 caps at bedtime What changed:  how much to take how to take this when to take this additional instructions   levofloxacin 500 MG tablet Commonly known as: Levaquin Take 1 tablet (500 mg total) by mouth daily for 5 days.   lidocaine-prilocaine cream Commonly known as: EMLA Apply 1 application topically as needed.   linaclotide 72 MCG capsule Commonly known as: Linzess Take 1 capsule (72 mcg total) by mouth daily as needed (constipation).   magic mouthwash (nystatin, lidocaine, diphenhydrAMINE, alum & mag hydroxide) suspension Swish and swallow 5 mLs by mouth 4 (four) times daily as needed for mouth pain   Magnesium 300 MG Caps Take 300 mg by mouth 2 (two) times a week.  methocarbamol 500 MG tablet Commonly known as: ROBAXIN Take 1 tablet (500 mg total) by mouth every 8 (eight) hours as needed for up to 14 days for muscle spasms.   ondansetron 8 MG tablet Commonly known as: ZOFRAN Take 1 tablet (8 mg total) by mouth every 8 (eight) hours as needed for nausea or vomiting (begin on day 3 after chemo).   oxyCODONE 15 mg 12 hr tablet Commonly known as: OXYCONTIN Take 1 tablet (15 mg total) by mouth every 12 (twelve) hours for 7 days. What changed: Another medication with the same name was removed. Continue taking this medication, and follow the directions you see here.   pantoprazole 40 MG tablet Commonly known as: PROTONIX TAKE 1 TABLET BY MOUTH EVERY DAY What changed:  when to take this reasons to take this   polyethylene glycol 17 g packet Commonly known as: MIRALAX / GLYCOLAX Take 17 g by mouth  daily.   sacubitril-valsartan 97-103 MG Commonly known as: ENTRESTO Take 1 tablet by mouth 2 (two) times daily.   senna-docusate 8.6-50 MG tablet Commonly known as: Senokot-S Take 1 tablet by mouth 2 (two) times daily.   spironolactone 25 MG tablet Commonly known as: ALDACTONE Take 1 tablet (25 mg total) by mouth daily.   Vitamin C 500 MG Caps Take 500 mg by mouth daily.   Vitamin D 50 MCG (2000 UT) Caps Take 2,000 Units by mouth daily.   WOMENS MULTI PO Take 15 mLs by mouth daily.   zinc gluconate 50 MG tablet Take 50 mg by mouth daily.        Allergies  Allergen Reactions   Morphine And Related Nausea And Vomiting   Latex Hives, Itching and Other (See Comments)    Burning, too   Tylenol With Codeine #3 [Acetaminophen-Codeine] Nausea And Vomiting   Penicillins Nausea Only, Swelling and Rash    Has patient had a PCN reaction causing immediate rash, facial/tongue/throat swelling, SOB or lightheadedness with hypotension: Yes Has patient had a PCN reaction causing severe rash involving mucus membranes or skin necrosis: Yes Has patient had a PCN reaction that required hospitalization: No Has patient had a PCN reaction occurring within the last 10 years: No TONGUE SWELLING AND RASH AROUND MOUTH If all of the above answers are "NO", then may proceed with Cephalosporin use.     You were cared for by a hospitalist during your hospital stay. If you have any questions about your discharge medications or the care you received while you were in the hospital after you are discharged, you can call the unit and asked to speak with the hospitalist on call if the hospitalist that took care of you is not available. Once you are discharged, your primary care physician will handle any further medical issues. Please note that no refills for any discharge medications will be authorized once you are discharged, as it is imperative that you return to your primary care physician (or establish a  relationship with a primary care physician if you do not have one) for your aftercare needs so that they can reassess your need for medications and monitor your lab values.   Procedures/Studies: VAS [place] LOWER EXTREMITY VENOUS (DVT)  Result Date: 10/01/2022  Lower Venous DVT Study Patient Name:  Myana ANN Bethards  Date of Exam:   10/01/2022 Medical Rec #: [id number]            Accession #:    [id number] Date of Birth: 07/21/1956  Patient Gender: F Patient Age:   67 years Exam Location:  [facility] Procedure:      VAS [place] LOWER EXTREMITY VENOUS (DVT) Referring Phys: Erin Hearing --------------------------------------------------------------------------------  Indications: Edema.  Risk Factors: Cancer. Limitations: Body habitus, poor ultrasound/tissue interface, bandages and patient pain tolerance, patient movement. Comparison Study: No prior studies. Performing Technologist: Oliver Hum RVT  Examination Guidelines: A complete evaluation includes B-mode imaging, spectral Doppler, color Doppler, and power Doppler as needed of all accessible portions of each vessel. Bilateral testing is considered an integral part of a complete examination. Limited examinations for reoccurring indications may be performed as noted. The reflux portion of the exam is performed with the patient in reverse Trendelenburg.  +---------+---------------+---------+-----------+----------+-------------------+ RIGHT    CompressibilityPhasicitySpontaneityPropertiesThrombus Aging      +---------+---------------+---------+-----------+----------+-------------------+ CFV                     No       No                   patency shown with                                                        color doppler       +---------+---------------+---------+-----------+----------+-------------------+ SFJ                                                   Not well visualized  +---------+---------------+---------+-----------+----------+-------------------+ FV Prox                 Yes      Yes                                      +---------+---------------+---------+-----------+----------+-------------------+ FV Mid                  Yes      Yes                                      +---------+---------------+---------+-----------+----------+-------------------+ FV Distal               Yes      Yes                                      +---------+---------------+---------+-----------+----------+-------------------+ PFV                                                   Not well visualized +---------+---------------+---------+-----------+----------+-------------------+ POP      Full           Yes      Yes                                      +---------+---------------+---------+-----------+----------+-------------------+  PTV      Full                                                             +---------+---------------+---------+-----------+----------+-------------------+ PERO                                                  Not well visualized +---------+---------------+---------+-----------+----------+-------------------+   +---------+---------------+---------+-----------+----------+--------------+ LEFT     CompressibilityPhasicitySpontaneityPropertiesThrombus Aging +---------+---------------+---------+-----------+----------+--------------+ CFV      Full           Yes      Yes                                 +---------+---------------+---------+-----------+----------+--------------+ SFJ      Full                                                        +---------+---------------+---------+-----------+----------+--------------+ FV Prox  Full                                                        +---------+---------------+---------+-----------+----------+--------------+ FV Mid   Full                                                         +---------+---------------+---------+-----------+----------+--------------+ FV DistalFull                                                        +---------+---------------+---------+-----------+----------+--------------+ PFV      Full                                                        +---------+---------------+---------+-----------+----------+--------------+ POP      Full           Yes      Yes                                 +---------+---------------+---------+-----------+----------+--------------+ PTV      Full                                                        +---------+---------------+---------+-----------+----------+--------------+  PERO     Full                                                        +---------+---------------+---------+-----------+----------+--------------+     Summary: RIGHT: - There is no evidence of deep vein thrombosis in the lower extremity. However, portions of this examination were limited- see technologist comments above.  - No cystic structure found in the popliteal fossa.  LEFT: - There is no evidence of deep vein thrombosis in the lower extremity. However, portions of this examination were limited- see technologist comments above.  - No cystic structure found in the popliteal fossa.  *See table(s) above for measurements and observations. Electronically signed by Deitra Mayo MD on 10/01/2022 at 6:02:29 PM.    Final    DG Chest Port 1 View  Result Date: 10/01/2022 CLINICAL DATA:  Weakness, found on ground EXAM: PORTABLE CHEST 1 VIEW COMPARISON:  Portable exam 1046 hours compared to 07/27/2021 FINDINGS: RIGHT jugular Port-A-Cath with tip projecting over SVC. Normal heart size, mediastinal contours, and pulmonary vascularity. Minimal subsegmental atelectasis at RIGHT base. Remaining lungs clear. No acute infiltrate, pleural effusion, pneumothorax, or acute osseous findings. Surgical clips LEFT breast and  LEFT axilla. IMPRESSION: Minimal RIGHT basilar atelectasis. Electronically Signed   By: Lavonia Dana M.D.   On: 10/01/2022 10:56   CT ABDOMEN PELVIS WO CONTRAST  Result Date: 10/01/2022 CLINICAL DATA:  LEFT breast cancer. Oral chemotherapy ongoing. Evaluate for metastatic disease. * Tracking Code: BO * EXAM: CT ABDOMEN AND PELVIS WITHOUT CONTRAST TECHNIQUE: Multidetector CT imaging of the abdomen and pelvis was performed following the standard protocol without IV contrast. RADIATION DOSE REDUCTION: This exam was performed according to the departmental dose-optimization program which includes automated exposure control, adjustment of the mA and/or kV according to patient size and/or use of iterative reconstruction technique. COMPARISON:  None Available. FINDINGS: Lower chest: Lung bases are clear. Round fluid collection in the LEFT breast presumed to represent postoperative seroma measuring 3 cm. Hepatobiliary: No focal hepatic lesion on noncontrast exam. Gallstone noted. No cholecystitis. Pancreas: Pancreas is normal. No ductal dilatation. No pancreatic inflammation. Spleen: Normal spleen Adrenals/urinary tract: Adrenal glands are normal. Persistent mild RIGHT hydronephrosis and hydroureter. Hydroureter extends to the RIGHT iliac vessels where potential compression by the enlarged uterus. No change from prior other than stenting of the IVC the common iliac vessels Bladder normal. Stomach/Bowel: Small amount fluid esophagus. The stomach, duodenum, and small bowel normal. Normal appendix. The colon and rectosigmoid colon are normal. Vascular/Lymphatic: Mild intimal calcification of the aorta. No dilatation. Mild adenopathy drapes over the LEFT aspect of the aorta (image 41/2. No interval change. Interval stenting of the common iliac veins and proximal IVC. No pelvic lymphadenopathy Reproductive: Enlarged lobular uterus unchanged from prior. 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 Some fluid in the endometrial  canal similar prior. Other: Again demonstrated significant edema of the proximal LEFT lower extremity. Musculoskeletal: No aggressive osseous lesion. IMPRESSION: 1. No significant interval change from CT 09/18/2022. No evidence of metastatic disease progression. 2. Interval stenting of the common iliac veins and proximal inferior vena cava. Persistent mild RIGHT hydronephrosis and hydroureter. Persistent RIGHT lower extremity edema. 3. Mild periaortic retroperitoneal adenopathy not changed from prior. 4. Stable enlarged uterus. 5. Postoperative seroma in the LEFT breast Electronically Signed   By: Nicole Kindred  Leonia Reeves M.D.   On: 10/01/2022 10:33   IR Venocavagram Ivc  Result Date: 09/25/2022 INDICATION: 67 year old woman with severe right lower extremity pain and swelling. CT of the abdomen and pelvis shows severe narrowing of both common iliac arteries. No right lower extremity DVT identified on Doppler examination. IR consulted for bilateral lower extremity venogram with possible venoplasty/stent placement EXAM: 1. Ultrasound guided access of right internal jugular vein (x2) 2. Ultrasound-guided access of left common femoral vein 3. Bilateral lower extremity venogram 4. Inferior vena cavogram 5. Bilateral lower extremity angioplasty and stent placement 6. IVUS evaluation of bilateral external and common iliac veins and IVC COMPARISON:  None Available. MEDICATIONS: None. ANESTHESIA/SEDATION: MAC sedation performed and monitored by the anesthesia team. FLUOROSCOPY: Radiation Exposure Index (as provided by the fluoroscopic device): 123456 mGy Kerma COMPLICATIONS: None immediate. FIRST ASSISTANT: Dr. Ruthann Cancer TECHNIQUE: Informed written consent was obtained from the patient after a thorough discussion of the procedural risks, benefits and alternatives. All questions were addressed. Maximal Sterile Barrier Technique was utilized including caps, mask, sterile gowns, sterile gloves, sterile drape, hand hygiene and skin  antiseptic. A timeout was performed prior to the initiation of the procedure. Right neck and left groin skin prepped and draped in a sterile fashion. Ultrasound image documenting patency of the right internal jugular vein was obtained and placed in permanent medical record. Sterile ultrasound probe cover and gel utilized throughout the procedure. Utilizing continuous ultrasound guidance, the right internal jugular vein was accessed with a 21 gauge needle. 21 gauge needle exchanged for a transitional dilator set over 0.018 inch guidewire. Transitional dilator set exchanged for 9 French sheath over 0.035 inch guidewire. Kumpe catheter advanced to the right external iliac vein. 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 The severe focal stenosis was confirmed with intravascular ultrasound. Intravascular ultrasound was also utilized to measure the diameter of normal vein proximal and distal to the site of stenosis. Ultrasound image documenting patency of the left common femoral vein was obtained and placed in permanent medical record. Sterile ultrasound probe cover and gel utilized throughout the procedure. Utilizing continuous ultrasound guidance, the left common femoral vein was accessed at the level of the femoral head with a 21 gauge needle. 21 gauge needle exchanged for a transitional dilator set over 0.018 inch guidewire. Transitional dilator set exchanged for 9 French sheath over 0.035 inch guidewire. Kumpe catheter advanced to the left external iliac vein. 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 Intravascular ultrasound evaluation of this region confirmed severe focal stenosis in the superior segment of the left common iliac vein. Intravascular ultrasound was utilized to determine normal  diameter of inferior vena cava and common iliac vein distal and proximal to the stenosed segment. Ultrasound image documenting patency of the right internal jugular vein was obtained and placed in permanent medical record. Sterile ultrasound probe cover and gel utilized throughout the procedure. Utilizing continuous ultrasound guidance, the right internal jugular vein was accessed a second time with a 21 gauge needle. 21 gauge needle exchanged for a transitional dilator set over 0.018 inch guidewire. Transitional dilator set exchanged for 9 French sheath over 0.035 inch guidewire. Kumpe catheter and guidewire were advanced across the left common iliac vein. Contrast administered through the Kumpe the catheter at the level of the left external iliac  vein confirmed appropriate intraluminal positioning. The stenosed segments of the common iliac veins bilaterally were dilated with 10 mm balloon. Bilateral 14 mm x 150 mm Abre stents were simultaneously deployed across the stenosed common iliac veins. The stents were gently simultaneously post dilated with 14 mm balloons. Bilateral lower extremity venogram was performed after post dilation of the stents, which showed excellent unobstructed flow to the inferior vena cava. No significant narrowing of the IVC was identified. Both stented segments were again evaluated with intravascular ultrasound. There was mild narrowing of the right and moderate narrowing of the left stent within the inferior vena cava, however given brisk flow through the uncovered stents, no additional venoplasty was performed. Right IJ and left CFV accesses were removed and hemostasis achieved with manual compression. 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 Electronically Signed   By: Miachel Roux M.D.   On: 09/25/2022 12:23   IR TRANSCATH PLC  STENT  EA ADD VEIN  INC ANGIOPLASTY  Result Date: 09/25/2022 INDICATION: 67 year old woman with severe right lower extremity pain and swelling. CT of the abdomen and pelvis shows severe narrowing of both common iliac arteries. No right lower extremity DVT identified on Doppler examination. IR consulted for bilateral lower extremity venogram with possible venoplasty/stent placement EXAM: 1. Ultrasound guided access of right internal jugular vein (x2) 2. Ultrasound-guided access of left common femoral vein 3. Bilateral lower extremity venogram 4. Inferior vena cavogram 5. Bilateral lower extremity angioplasty and stent placement 6. IVUS evaluation of bilateral external and common iliac veins and IVC COMPARISON:  None Available. MEDICATIONS: None. ANESTHESIA/SEDATION: MAC sedation performed and monitored by the anesthesia team. FLUOROSCOPY: Radiation Exposure Index (as provided by the fluoroscopic device): 123456 mGy Kerma COMPLICATIONS: None immediate. FIRST ASSISTANT: Dr. Ruthann Cancer TECHNIQUE: Informed written consent was obtained from the patient after a thorough discussion of the procedural risks, benefits and alternatives. All questions were addressed. Maximal Sterile Barrier Technique was utilized including caps, mask, sterile gowns, sterile gloves, sterile drape, hand hygiene and skin antiseptic. A timeout was performed prior to the initiation of the procedure. Right neck and left groin skin prepped and draped in a sterile fashion. Ultrasound image documenting patency of the right internal jugular vein was obtained and placed in permanent medical record. Sterile ultrasound probe cover and gel utilized throughout the procedure. Utilizing continuous ultrasound guidance, the right internal jugular vein was accessed with a 21 gauge needle. 21 gauge needle exchanged for a transitional dilator set over 0.018 inch guidewire. Transitional dilator set exchanged for 9 French sheath over 0.035 inch guidewire. Kumpe  catheter advanced to the right external iliac vein. 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 The severe focal stenosis was confirmed with intravascular ultrasound. Intravascular ultrasound was also utilized to measure the diameter of normal vein proximal and distal to the site of stenosis. Ultrasound image documenting patency of the left common femoral vein was obtained and placed in permanent medical record. Sterile ultrasound probe cover and gel utilized throughout the procedure. Utilizing continuous ultrasound guidance, the left common femoral vein was accessed at the level of the femoral head with a 21 gauge needle. 21 gauge needle exchanged for a transitional dilator set over 0.018 inch guidewire. Transitional dilator set exchanged for 9 French sheath over 0.035 inch guidewire. Kumpe catheter advanced to  the left external iliac vein. 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 Intravascular ultrasound evaluation of this region confirmed severe focal stenosis in the superior segment of the left common iliac vein. Intravascular ultrasound was utilized to determine normal diameter of inferior vena cava and common iliac vein distal and proximal to the stenosed segment. Ultrasound image documenting patency of the right internal jugular vein was obtained and placed in permanent medical record. Sterile ultrasound probe cover and gel utilized throughout the procedure. Utilizing continuous ultrasound guidance, the right internal jugular vein was accessed a second time with a 21 gauge needle. 21 gauge needle exchanged for a transitional dilator set over 0.018 inch guidewire. Transitional dilator set exchanged for 9 French sheath over 0.035 inch guidewire. Kumpe catheter and guidewire were advanced  across the left common iliac vein. Contrast administered through the Kumpe the catheter at the level of the left external iliac vein confirmed appropriate intraluminal positioning. The stenosed segments of the common iliac veins bilaterally were dilated with 10 mm balloon. Bilateral 14 mm x 150 mm Abre stents were simultaneously deployed across the stenosed common iliac veins. The stents were gently simultaneously post dilated with 14 mm balloons. Bilateral lower extremity venogram was performed after post dilation of the stents, which showed excellent unobstructed flow to the inferior vena cava. No significant narrowing of the IVC was identified. Both stented segments were again evaluated with intravascular ultrasound. There was mild narrowing of the right and moderate narrowing of the left stent within the inferior vena cava, however given brisk flow through the uncovered stents, no additional venoplasty was performed. Right IJ and left CFV accesses were removed and hemostasis achieved with manual compression. 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 Electronically Signed   By: Miachel Roux M.D.   On: 09/25/2022 12:22   IR Veno/Ext/Uni Right  Result Date: 09/25/2022 INDICATION: 67 year old woman with severe right lower extremity pain and swelling. CT of the abdomen and pelvis shows severe narrowing of both common iliac arteries. No right lower extremity DVT identified on Doppler examination. IR consulted for bilateral lower extremity venogram with possible venoplasty/stent placement EXAM: 1. Ultrasound guided access of right internal jugular vein (x2) 2. Ultrasound-guided access of left common femoral vein 3. Bilateral lower extremity venogram 4. Inferior vena cavogram 5. Bilateral lower extremity angioplasty and stent placement 6. IVUS evaluation of  bilateral external and common iliac veins and IVC COMPARISON:  None Available. MEDICATIONS: None. ANESTHESIA/SEDATION: MAC sedation performed and monitored by the anesthesia team. FLUOROSCOPY: Radiation Exposure Index (as provided by the fluoroscopic device): 123456 mGy Kerma COMPLICATIONS: None immediate. FIRST ASSISTANT: Dr. Ruthann Cancer TECHNIQUE: Informed written consent was obtained from the patient after a thorough discussion of the procedural risks, benefits and alternatives. All questions were addressed. Maximal Sterile Barrier Technique was utilized including caps, mask, sterile gowns, sterile gloves, sterile drape, hand hygiene and skin antiseptic. A timeout was performed prior to the initiation of the procedure. Right neck and left groin skin prepped and draped in a sterile fashion. Ultrasound image documenting patency of the right internal jugular vein was obtained and placed in permanent medical record. Sterile ultrasound probe cover and gel utilized throughout the procedure. Utilizing continuous ultrasound guidance, the right internal jugular vein was accessed with a 21  gauge needle. 21 gauge needle exchanged for a transitional dilator set over 0.018 inch guidewire. Transitional dilator set exchanged for 9 French sheath over 0.035 inch guidewire. Kumpe catheter advanced to the right external iliac vein. 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 The severe focal stenosis was confirmed with intravascular ultrasound. Intravascular ultrasound was also utilized to measure the diameter of normal vein proximal and distal to the site of stenosis. Ultrasound image documenting patency of the left common femoral vein was obtained and placed in permanent medical record. Sterile ultrasound probe cover and gel utilized throughout the procedure. Utilizing continuous ultrasound guidance, the left common femoral vein was  accessed at the level of the femoral head with a 21 gauge needle. 21 gauge needle exchanged for a transitional dilator set over 0.018 inch guidewire. Transitional dilator set exchanged for 9 French sheath over 0.035 inch guidewire. Kumpe catheter advanced to the left external iliac vein. 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 Intravascular ultrasound evaluation of this region confirmed severe focal stenosis in the superior segment of the left common iliac vein. Intravascular ultrasound was utilized to determine normal diameter of inferior vena cava and common iliac vein distal and proximal to the stenosed segment. Ultrasound image documenting patency of the right internal jugular vein was obtained and placed in permanent medical record. Sterile ultrasound probe cover and gel utilized throughout the procedure. Utilizing continuous ultrasound guidance, the right internal jugular vein was accessed a second time with a 21 gauge needle. 21 gauge needle exchanged for a transitional dilator set over 0.018 inch guidewire. Transitional dilator set exchanged for 9 French sheath over 0.035 inch guidewire. Kumpe catheter and guidewire were advanced across the left common iliac vein. Contrast administered through the Kumpe the catheter at the level of the left external iliac vein confirmed appropriate intraluminal positioning. The stenosed segments of the common iliac veins bilaterally were dilated with 10 mm balloon. Bilateral 14 mm x 150 mm Abre stents were simultaneously deployed across the stenosed common iliac veins. The stents were gently simultaneously post dilated with 14 mm balloons. Bilateral lower extremity venogram was performed after post dilation of the stents, which showed excellent unobstructed flow to the inferior vena cava. No significant narrowing of the IVC was identified. Both  stented segments were again evaluated with intravascular ultrasound. There was mild narrowing of the right and moderate narrowing of the left stent within the inferior vena cava, however given brisk flow through the uncovered stents, no additional venoplasty was performed. Right IJ and left CFV accesses were removed and hemostasis achieved with manual compression. 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 Electronically Signed   By: Miachel Roux M.D.   On: 09/25/2022 10:49   IR US Guide Vasc Access Right  Result Date: 09/25/2022 INDICATION: 67 year old woman with severe right lower extremity pain and swelling. CT of the abdomen and pelvis shows severe narrowing of both common iliac arteries. No right lower extremity DVT identified on Doppler examination. IR consulted for bilateral lower extremity venogram with possible venoplasty/stent placement EXAM: 1. Ultrasound guided access of right internal jugular vein (x2) 2. Ultrasound-guided access of left common femoral vein 3. Bilateral lower extremity venogram 4.  Inferior vena cavogram 5. Bilateral lower extremity angioplasty and stent placement 6. IVUS evaluation of bilateral external and common iliac veins and IVC COMPARISON:  None Available. MEDICATIONS: None. ANESTHESIA/SEDATION: MAC sedation performed and monitored by the anesthesia team. FLUOROSCOPY: Radiation Exposure Index (as provided by the fluoroscopic device): 123456 mGy Kerma COMPLICATIONS: None immediate. FIRST ASSISTANT: Dr. Ruthann Cancer TECHNIQUE: Informed written consent was obtained from the patient after a thorough discussion of the procedural risks, benefits and alternatives. All questions were addressed. Maximal Sterile Barrier Technique was utilized including caps, mask, sterile gowns, sterile gloves, sterile drape, hand hygiene and skin  antiseptic. A timeout was performed prior to the initiation of the procedure. Right neck and left groin skin prepped and draped in a sterile fashion. Ultrasound image documenting patency of the right internal jugular vein was obtained and placed in permanent medical record. Sterile ultrasound probe cover and gel utilized throughout the procedure. Utilizing continuous ultrasound guidance, the right internal jugular vein was accessed with a 21 gauge needle. 21 gauge needle exchanged for a transitional dilator set over 0.018 inch guidewire. Transitional dilator set exchanged for 9 French sheath over 0.035 inch guidewire. Kumpe catheter advanced to the right external iliac vein. 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 The severe focal stenosis was confirmed with intravascular ultrasound. Intravascular ultrasound was also utilized to measure the diameter of normal vein proximal and distal to the site of stenosis. Ultrasound image documenting patency of the left common femoral vein was obtained and placed in permanent medical record. Sterile ultrasound probe cover and gel utilized throughout the procedure. Utilizing continuous ultrasound guidance, the left common femoral vein was accessed at the level of the femoral head with a 21 gauge needle. 21 gauge needle exchanged for a transitional dilator set over 0.018 inch guidewire. Transitional dilator set exchanged for 9 French sheath over 0.035 inch guidewire. Kumpe catheter advanced to the left external iliac vein. 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 Intravascular ultrasound evaluation of this region confirmed severe focal stenosis in the superior segment of the left common iliac vein. Intravascular ultrasound was utilized to determine normal  diameter of inferior vena cava and common iliac vein distal and proximal to the stenosed segment. Ultrasound image documenting patency of the right internal jugular vein was obtained and placed in permanent medical record. Sterile ultrasound probe cover and gel utilized throughout the procedure. Utilizing continuous ultrasound guidance, the right internal jugular vein was accessed a second time with a 21 gauge needle. 21 gauge needle exchanged for a transitional dilator set over 0.018 inch guidewire. Transitional dilator set exchanged for 9 French sheath over 0.035 inch guidewire. Kumpe catheter and guidewire were advanced across the left common iliac vein. Contrast administered through the Kumpe the catheter at the level of the left external iliac vein confirmed appropriate intraluminal positioning. The stenosed segments of the common iliac veins bilaterally were dilated with 10 mm balloon. Bilateral 14 mm x 150 mm Abre stents were simultaneously deployed across the stenosed common iliac veins. The stents were gently simultaneously post dilated with 14 mm balloons. Bilateral lower extremity venogram was performed after post dilation of the stents, which showed excellent unobstructed flow to the inferior vena cava. No significant narrowing of the IVC was identified. Both stented segments were again evaluated with intravascular  ultrasound. There was mild narrowing of the right and moderate narrowing of the left stent within the inferior vena cava, however given brisk flow through the uncovered stents, no additional venoplasty was performed. Right IJ and left CFV accesses were removed and hemostasis achieved with manual compression. 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 Electronically Signed   By: Miachel Roux M.D.   On: 09/25/2022 10:49   IR IVUS EACH  ADDITIONAL NON CORONARY VESSEL  Result Date: 09/25/2022 INDICATION: 67 year old woman with severe right lower extremity pain and swelling. CT of the abdomen and pelvis shows severe narrowing of both common iliac arteries. No right lower extremity DVT identified on Doppler examination. IR consulted for bilateral lower extremity venogram with possible venoplasty/stent placement EXAM: 1. Ultrasound guided access of right internal jugular vein (x2) 2. Ultrasound-guided access of left common femoral vein 3. Bilateral lower extremity venogram 4. Inferior vena cavogram 5. Bilateral lower extremity angioplasty and stent placement 6. IVUS evaluation of bilateral external and common iliac veins and IVC COMPARISON:  None Available. MEDICATIONS: None. ANESTHESIA/SEDATION: MAC sedation performed and monitored by the anesthesia team. FLUOROSCOPY: Radiation Exposure Index (as provided by the fluoroscopic device): 123456 mGy Kerma COMPLICATIONS: None immediate. FIRST ASSISTANT: Dr. Ruthann Cancer TECHNIQUE: Informed written consent was obtained from the patient after a thorough discussion of the procedural risks, benefits and alternatives. All questions were addressed. Maximal Sterile Barrier Technique was utilized including caps, mask, sterile gowns, sterile gloves, sterile drape, hand hygiene and skin antiseptic. A timeout was performed prior to the initiation of the procedure. Right neck and left groin skin prepped and draped in a sterile fashion. Ultrasound image documenting patency of the right internal jugular vein was obtained and placed in permanent medical record. Sterile ultrasound probe cover and gel utilized throughout the procedure. Utilizing continuous ultrasound guidance, the right internal jugular vein was accessed with a 21 gauge needle. 21 gauge needle exchanged for a transitional dilator set over 0.018 inch guidewire. Transitional dilator set exchanged for 9 French sheath over 0.035 inch guidewire. Kumpe catheter  advanced to the right external iliac vein. 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 The severe focal stenosis was confirmed with intravascular ultrasound. Intravascular ultrasound was also utilized to measure the diameter of normal vein proximal and distal to the site of stenosis. Ultrasound image documenting patency of the left common femoral vein was obtained and placed in permanent medical record. Sterile ultrasound probe cover and gel utilized throughout the procedure. Utilizing continuous ultrasound guidance, the left common femoral vein was accessed at the level of the femoral head with a 21 gauge needle. 21 gauge needle exchanged for a transitional dilator set over 0.018 inch guidewire. Transitional dilator set exchanged for 9 French sheath over 0.035 inch guidewire. Kumpe catheter advanced to the left external iliac vein. 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 Intravascular ultrasound evaluation of this region confirmed severe focal stenosis in the superior segment of the left common iliac vein. Intravascular ultrasound was utilized to determine normal diameter of inferior vena cava and common iliac vein distal and proximal to the stenosed segment. Ultrasound image documenting patency of the right internal jugular vein was obtained and placed  in permanent medical record. Sterile ultrasound probe cover and gel utilized throughout the procedure. Utilizing continuous ultrasound guidance, the right internal jugular vein was accessed a second time with a 21 gauge needle. 21 gauge needle exchanged for a transitional dilator set over 0.018 inch guidewire. Transitional dilator set exchanged for 9 French sheath over 0.035 inch guidewire. Kumpe catheter and guidewire were advanced across  the left common iliac vein. Contrast administered through the Kumpe the catheter at the level of the left external iliac vein confirmed appropriate intraluminal positioning. The stenosed segments of the common iliac veins bilaterally were dilated with 10 mm balloon. Bilateral 14 mm x 150 mm Abre stents were simultaneously deployed across the stenosed common iliac veins. The stents were gently simultaneously post dilated with 14 mm balloons. Bilateral lower extremity venogram was performed after post dilation of the stents, which showed excellent unobstructed flow to the inferior vena cava. No significant narrowing of the IVC was identified. Both stented segments were again evaluated with intravascular ultrasound. There was mild narrowing of the right and moderate narrowing of the left stent within the inferior vena cava, however given brisk flow through the uncovered stents, no additional venoplasty was performed. Right IJ and left CFV accesses were removed and hemostasis achieved with manual compression. 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 Electronically Signed   By: Miachel Roux M.D.   On: 09/25/2022 10:49   IR IVUS EACH ADDITIONAL NON CORONARY VESSEL  Result Date: 09/25/2022 INDICATION: 67 year old woman with severe right lower extremity pain and swelling. CT of the abdomen and pelvis shows severe narrowing of both common iliac arteries. No right lower extremity DVT identified on Doppler examination. IR consulted for bilateral lower extremity venogram with possible venoplasty/stent placement EXAM: 1. Ultrasound guided access of right internal jugular vein (x2) 2. Ultrasound-guided access of left common femoral vein 3. Bilateral lower extremity venogram 4. Inferior vena cavogram 5. Bilateral lower extremity angioplasty and stent placement 6. IVUS  evaluation of bilateral external and common iliac veins and IVC COMPARISON:  None Available. MEDICATIONS: None. ANESTHESIA/SEDATION: MAC sedation performed and monitored by the anesthesia team. FLUOROSCOPY: Radiation Exposure Index (as provided by the fluoroscopic device): 123456 mGy Kerma COMPLICATIONS: None immediate. FIRST ASSISTANT: Dr. Ruthann Cancer TECHNIQUE: Informed written consent was obtained from the patient after a thorough discussion of the procedural risks, benefits and alternatives. All questions were addressed. Maximal Sterile Barrier Technique was utilized including caps, mask, sterile gowns, sterile gloves, sterile drape, hand hygiene and skin antiseptic. A timeout was performed prior to the initiation of the procedure. Right neck and left groin skin prepped and draped in a sterile fashion. Ultrasound image documenting patency of the right internal jugular vein was obtained and placed in permanent medical record. Sterile ultrasound probe cover and gel utilized throughout the procedure. Utilizing continuous ultrasound guidance, the right internal jugular vein was accessed with a 21 gauge needle. 21 gauge needle exchanged for a transitional dilator set over 0.018 inch guidewire. Transitional dilator set exchanged for 9 French sheath over 0.035 inch guidewire. Kumpe catheter advanced to the right external iliac vein. 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 The severe focal stenosis was confirmed with intravascular ultrasound. Intravascular ultrasound was also utilized to measure the diameter of normal vein proximal and distal to the site of  stenosis. Ultrasound image documenting patency of the left common femoral vein was obtained and placed in permanent medical record. Sterile ultrasound probe cover and gel utilized throughout the procedure. Utilizing continuous ultrasound guidance, the left common  femoral vein was accessed at the level of the femoral head with a 21 gauge needle. 21 gauge needle exchanged for a transitional dilator set over 0.018 inch guidewire. Transitional dilator set exchanged for 9 French sheath over 0.035 inch guidewire. Kumpe catheter advanced to the left external iliac vein. 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 Intravascular ultrasound evaluation of this region confirmed severe focal stenosis in the superior segment of the left common iliac vein. Intravascular ultrasound was utilized to determine normal diameter of inferior vena cava and common iliac vein distal and proximal to the stenosed segment. Ultrasound image documenting patency of the right internal jugular vein was obtained and placed in permanent medical record. Sterile ultrasound probe cover and gel utilized throughout the procedure. Utilizing continuous ultrasound guidance, the right internal jugular vein was accessed a second time with a 21 gauge needle. 21 gauge needle exchanged for a transitional dilator set over 0.018 inch guidewire. Transitional dilator set exchanged for 9 French sheath over 0.035 inch guidewire. Kumpe catheter and guidewire were advanced across the left common iliac vein. Contrast administered through the Kumpe the catheter at the level of the left external iliac vein confirmed appropriate intraluminal positioning. The stenosed segments of the common iliac veins bilaterally were dilated with 10 mm balloon. Bilateral 14 mm x 150 mm Abre stents were simultaneously deployed across the stenosed common iliac veins. The stents were gently simultaneously post dilated with 14 mm balloons. Bilateral lower extremity venogram was performed after post dilation of the stents, which showed excellent unobstructed flow to the inferior vena cava. No significant narrowing of the IVC was  identified. Both stented segments were again evaluated with intravascular ultrasound. There was mild narrowing of the right and moderate narrowing of the left stent within the inferior vena cava, however given brisk flow through the uncovered stents, no additional venoplasty was performed. Right IJ and left CFV accesses were removed and hemostasis achieved with manual compression. 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 Electronically Signed   By: Miachel Roux M.D.   On: 09/25/2022 10:49   IR Veno/Ext/Uni Left  Result Date: 09/25/2022 INDICATION: 67 year old woman with severe right lower extremity pain and swelling. CT of the abdomen and pelvis shows severe narrowing of both common iliac arteries. No right lower extremity DVT identified on Doppler examination. IR consulted for bilateral lower extremity venogram with possible venoplasty/stent placement EXAM: 1. Ultrasound guided access of right internal jugular vein (x2) 2. Ultrasound-guided access of left common femoral vein 3. Bilateral lower extremity venogram 4. Inferior vena cavogram 5. Bilateral lower extremity angioplasty and stent placement 6. IVUS evaluation of bilateral external and common iliac veins and IVC COMPARISON:  None Available. MEDICATIONS: None. ANESTHESIA/SEDATION: MAC sedation performed and monitored by the anesthesia team. FLUOROSCOPY: Radiation Exposure Index (as provided by the fluoroscopic device): 123456 mGy Kerma COMPLICATIONS: None immediate. FIRST ASSISTANT: Dr. Ruthann Cancer TECHNIQUE: Informed written consent was obtained from the patient after a thorough discussion of the procedural risks, benefits and alternatives. All questions were addressed. Maximal Sterile Barrier Technique was  utilized including caps, mask, sterile gowns, sterile gloves, sterile drape, hand hygiene and  skin antiseptic. A timeout was performed prior to the initiation of the procedure. Right neck and left groin skin prepped and draped in a sterile fashion. Ultrasound image documenting patency of the right internal jugular vein was obtained and placed in permanent medical record. Sterile ultrasound probe cover and gel utilized throughout the procedure. Utilizing continuous ultrasound guidance, the right internal jugular vein was accessed with a 21 gauge needle. 21 gauge needle exchanged for a transitional dilator set over 0.018 inch guidewire. Transitional dilator set exchanged for 9 French sheath over 0.035 inch guidewire. Kumpe catheter advanced to the right external iliac vein. 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 The severe focal stenosis was confirmed with intravascular ultrasound. Intravascular ultrasound was also utilized to measure the diameter of normal vein proximal and distal to the site of stenosis. Ultrasound image documenting patency of the left common femoral vein was obtained and placed in permanent medical record. Sterile ultrasound probe cover and gel utilized throughout the procedure. Utilizing continuous ultrasound guidance, the left common femoral vein was accessed at the level of the femoral head with a 21 gauge needle. 21 gauge needle exchanged for a transitional dilator set over 0.018 inch guidewire. Transitional dilator set exchanged for 9 French sheath over 0.035 inch guidewire. Kumpe catheter advanced to the left external iliac vein. 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 Intravascular ultrasound evaluation of this region confirmed severe focal stenosis in the superior segment of the left common iliac vein. Intravascular ultrasound was utilized to determine  normal diameter of inferior vena cava and common iliac vein distal and proximal to the stenosed segment. Ultrasound image documenting patency of the right internal jugular vein was obtained and placed in permanent medical record. Sterile ultrasound probe cover and gel utilized throughout the procedure. Utilizing continuous ultrasound guidance, the right internal jugular vein was accessed a second time with a 21 gauge needle. 21 gauge needle exchanged for a transitional dilator set over 0.018 inch guidewire. Transitional dilator set exchanged for 9 French sheath over 0.035 inch guidewire. Kumpe catheter and guidewire were advanced across the left common iliac vein. Contrast administered through the Kumpe the catheter at the level of the left external iliac vein confirmed appropriate intraluminal positioning. The stenosed segments of the common iliac veins bilaterally were dilated with 10 mm balloon. Bilateral 14 mm x 150 mm Abre stents were simultaneously deployed across the stenosed common iliac veins. The stents were gently simultaneously post dilated with 14 mm balloons. Bilateral lower extremity venogram was performed after post dilation of the stents, which showed excellent unobstructed flow to the inferior vena cava. No significant narrowing of the IVC was identified. Both stented segments were again evaluated with intravascular ultrasound. There was mild narrowing of the right and moderate narrowing of the left stent within the inferior vena cava, however given brisk flow through the uncovered stents, no additional venoplasty was performed. Right IJ and left CFV accesses were removed and hemostasis achieved with manual compression. 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 Electronically Signed   By: Miachel Roux M.D.   On: 09/25/2022 10:49  IR  TRANSCATH PLC STENT  INITIAL VEIN  INC ANGIOPLASTY  Result Date: 09/25/2022 INDICATION: 67 year old woman with severe right lower extremity pain and swelling. CT of the abdomen and pelvis shows severe narrowing of both common iliac arteries. No right lower extremity DVT identified on Doppler examination. IR consulted for bilateral lower extremity venogram with possible venoplasty/stent placement EXAM: 1. Ultrasound guided access of right internal jugular vein (x2) 2. Ultrasound-guided access of left common femoral vein 3. Bilateral lower extremity venogram 4. Inferior vena cavogram 5. Bilateral lower extremity angioplasty and stent placement 6. IVUS evaluation of bilateral external and common iliac veins and IVC COMPARISON:  None Available. MEDICATIONS: None. ANESTHESIA/SEDATION: MAC sedation performed and monitored by the anesthesia team. FLUOROSCOPY: Radiation Exposure Index (as provided by the fluoroscopic device): 123456 mGy Kerma COMPLICATIONS: None immediate. FIRST ASSISTANT: Dr. Ruthann Cancer TECHNIQUE: Informed written consent was obtained from the patient after a thorough discussion of the procedural risks, benefits and alternatives. All questions were addressed. Maximal Sterile Barrier Technique was utilized including caps, mask, sterile gowns, sterile gloves, sterile drape, hand hygiene and skin antiseptic. A timeout was performed prior to the initiation of the procedure. Right neck and left groin skin prepped and draped in a sterile fashion. Ultrasound image documenting patency of the right internal jugular vein was obtained and placed in permanent medical record. Sterile ultrasound probe cover and gel utilized throughout the procedure. Utilizing continuous ultrasound guidance, the right internal jugular vein was accessed with a 21 gauge needle. 21 gauge needle exchanged for a transitional dilator set over 0.018 inch guidewire. Transitional dilator set exchanged for 9 French sheath over 0.035 inch  guidewire. Kumpe catheter advanced to the right external iliac vein. 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 The severe focal stenosis was confirmed with intravascular ultrasound. Intravascular ultrasound was also utilized to measure the diameter of normal vein proximal and distal to the site of stenosis. Ultrasound image documenting patency of the left common femoral vein was obtained and placed in permanent medical record. Sterile ultrasound probe cover and gel utilized throughout the procedure. Utilizing continuous ultrasound guidance, the left common femoral vein was accessed at the level of the femoral head with a 21 gauge needle. 21 gauge needle exchanged for a transitional dilator set over 0.018 inch guidewire. Transitional dilator set exchanged for 9 French sheath over 0.035 inch guidewire. Kumpe catheter advanced to the left external iliac vein. 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 Intravascular ultrasound evaluation of this region confirmed severe focal stenosis in the superior segment of the left common iliac vein. Intravascular ultrasound was utilized to determine normal diameter of inferior vena cava and common iliac vein distal and proximal to the stenosed segment. Ultrasound image documenting patency of the right internal jugular vein was obtained and placed in permanent medical record. Sterile ultrasound probe cover and gel utilized throughout the procedure. Utilizing continuous ultrasound guidance, the right internal jugular vein was accessed a second time with a 21 gauge needle. 21 gauge needle exchanged for a transitional dilator set over 0.018 inch guidewire. Transitional dilator set exchanged for 9 French sheath over 0.035 inch guidewire. Kumpe catheter and  guidewire were advanced across the left common iliac vein. Contrast administered through the Kumpe the catheter at the level of the left external iliac vein  confirmed appropriate intraluminal positioning. The stenosed segments of the common iliac veins bilaterally were dilated with 10 mm balloon. Bilateral 14 mm x 150 mm Abre stents were simultaneously deployed across the stenosed common iliac veins. The stents were gently simultaneously post dilated with 14 mm balloons. Bilateral lower extremity venogram was performed after post dilation of the stents, which showed excellent unobstructed flow to the inferior vena cava. No significant narrowing of the IVC was identified. Both stented segments were again evaluated with intravascular ultrasound. There was mild narrowing of the right and moderate narrowing of the left stent within the inferior vena cava, however given brisk flow through the uncovered stents, no additional venoplasty was performed. Right IJ and left CFV accesses were removed and hemostasis achieved with manual compression. 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 Electronically Signed   By: Miachel Roux M.D.   On: 09/25/2022 10:49   IR Veno/Ext/Bi  Result Date: 09/23/2022 INDICATION: 67 year old female presents for venogram, intravascular ultrasound, possible intervention for suspected right iliac vein occlusion and symptomatic swelling of the right lower extremity. EXAM: LOWER EXTREMITY VENOGRAM DISCONTINUATION OF LOWER EXTREMITY VENOGRAM COMPARISON:  CT 09/18/2022 MEDICATIONS: NONE ANESTHESIA/SEDATION: Moderate (conscious) sedation was employed during this procedure. A total of Versed 1.0 mg and DILAUDID 1 MG, 50 mcg fentanyl was administered intravenously by the radiology nurse. Total intra-service moderate Sedation Time: 15 minutes. The  patient's level of consciousness and vital signs were monitored continuously by radiology nursing throughout the procedure under my direct supervision. FLUOROSCOPY: None COMPLICATIONS: None TECHNIQUE: Informed written consent was obtained from the patient after a thorough discussion of the procedural risks, benefits and alternatives. All questions were addressed. Maximal Sterile Barrier Technique was utilized including caps, mask, sterile gowns, sterile gloves, sterile drape, hand hygiene and skin antiseptic. A timeout was performed prior to the initiation of the procedure. Initiation of conscious sedation was performed in order to attempt to have the patient become comfortable under the image intensifier. After medications it was clear that the patient would not be comfortable nor safe on the table. Procedure was aborted. FINDINGS: None IMPRESSION: Attempt at lower extremity venogram and possible treatment was aborted, as the patient could not be made comfortable with moderate sedation. Signed, Dulcy Fanny. Nadene Rubins, RPVI [facility] Electronically Signed   By: Corrie Mckusick D.O.   On: 09/23/2022 11:55   VAS [place] LOWER EXTREMITY VENOUS (DVT)  Result Date: 09/19/2022  Lower Venous DVT Study Patient Name:  Shaka ANN Propst  Date of Exam:   09/18/2022 Medical Rec #: [id number]            Accession #:    [id number] Date of Birth: October 26, 1955           Patient Gender: F Patient Age:   82 years Exam Location:  [facility] Procedure:      VAS [place] LOWER EXTREMITY VENOUS (DVT) Referring Phys: DAVID ORTIZ --------------------------------------------------------------------------------  Indications: Edema.  Risk Factors: Cancer. Limitations: Body habitus, poor ultrasound/tissue interface, bandages and patient positioning, patient pain tolerance, patient movement. Comparison Study: No prior studies. Performing Technologist: Oliver Hum RVT  Examination  Guidelines: A complete evaluation includes B-mode imaging, spectral Doppler, color Doppler, and power Doppler as needed of all accessible portions of each vessel. Bilateral testing is considered an integral part of a complete examination. Limited examinations for reoccurring indications  may be performed as noted. The reflux portion of the exam is performed with the patient in reverse Trendelenburg.  +---------+---------------+---------+-----------+----------+-------------------+ RIGHT    CompressibilityPhasicitySpontaneityPropertiesThrombus Aging      +---------+---------------+---------+-----------+----------+-------------------+ CFV                     Yes      Yes                                      +---------+---------------+---------+-----------+----------+-------------------+ FV Prox                 Yes      Yes                                      +---------+---------------+---------+-----------+----------+-------------------+ FV Mid                  Yes      Yes                                      +---------+---------------+---------+-----------+----------+-------------------+ FV Distal               Yes      Yes                                      +---------+---------------+---------+-----------+----------+-------------------+ POP      Full           Yes      Yes                                      +---------+---------------+---------+-----------+----------+-------------------+ PTV      Full                                                             +---------+---------------+---------+-----------+----------+-------------------+ PERO                                                  Not well visualized +---------+---------------+---------+-----------+----------+-------------------+   +---------+---------------+---------+-----------+----------+--------------+ LEFT     CompressibilityPhasicitySpontaneityPropertiesThrombus Aging  +---------+---------------+---------+-----------+----------+--------------+ CFV      Full           Yes      Yes                                 +---------+---------------+---------+-----------+----------+--------------+ SFJ      Full                                                        +---------+---------------+---------+-----------+----------+--------------+  FV Prox  Full                                                        +---------+---------------+---------+-----------+----------+--------------+ FV Mid   Full                                                        +---------+---------------+---------+-----------+----------+--------------+ FV DistalFull                                                        +---------+---------------+---------+-----------+----------+--------------+ PFV      Full                                                        +---------+---------------+---------+-----------+----------+--------------+ POP      Full           Yes      Yes                                 +---------+---------------+---------+-----------+----------+--------------+ PTV      Full                                                        +---------+---------------+---------+-----------+----------+--------------+ PERO     Full                                                        +---------+---------------+---------+-----------+----------+--------------+     Summary: RIGHT: - There is no evidence of deep vein thrombosis in the lower extremity. However, portions of this examination were limited- see technologist comments above.  - No cystic structure found in the popliteal fossa.  LEFT: - There is no evidence of deep vein thrombosis in the lower extremity.  - No cystic structure found in the popliteal fossa.  *See table(s) above for measurements and observations. Electronically signed by Jamelle Haring on 09/19/2022 at 2:52:33 PM.    Final    CT  ABDOMEN PELVIS W CONTRAST  Result Date: 09/18/2022 CLINICAL DATA:  Metastatic disease evaluation.  Breast cancer. EXAM: CT ABDOMEN AND PELVIS WITH CONTRAST TECHNIQUE: Multidetector CT imaging of the abdomen and pelvis was performed using the standard protocol following bolus administration of intravenous contrast. RADIATION DOSE REDUCTION: This exam was performed according to the departmental dose-optimization program which includes automated exposure control, adjustment of the mA and/or kV according to patient size and/or use of iterative reconstruction technique. CONTRAST:  75m OMNIPAQUE IOHEXOL  300 MG/ML  SOLN COMPARISON:  CT of the chest abdomen pelvis dated 08/13/2022. FINDINGS: Lower chest: Minimal right lung base linear atelectasis/scarring. No intra-abdominal free air.  Small free fluid within the pelvis. Hepatobiliary: The liver is unremarkable. No biliary dilatation there is stone within the gallbladder. No pericholecystic fluid or evidence of acute cholecystitis by CT. Pancreas: Unremarkable. No pancreatic ductal dilatation or surrounding inflammatory changes. Spleen: Normal in size without focal abnormality. Adrenals/Urinary Tract: The adrenal glands are unremarkable. Interval development of mild right hydronephrosis and mild right hydroureter. No obstructing stone identified. Findings likely related to compression of the distal right ureter within the pelvis. There is no hydronephrosis or nephrolithiasis on the left. The urinary bladder is mildly distended. There is focal area of thickened appearance of the right posterior bladder dome which is suboptimally evaluated. A bladder lesion is not excluded further evaluation with cystoscopy is recommended. There is diffuse bladder wall haziness and edema. Correlation with urinalysis recommended to exclude cystitis. Stomach/Bowel: There is no bowel obstruction or active inflammation. The appendix is normal. Vascular/Lymphatic: Mild aortoiliac atherosclerotic  disease. The IVC is unremarkable. There is retroperitoneal adenopathy similar to prior CT. No portal venous gas. Reproductive: Enlarged myomatous uterus. There is fluid distended endometrium concerning for obstruction or stricture of the endocervical canal. Other: There is extensive edema of the right lower extremity likely related to compression of the right iliac veins within the pelvis. This has significantly progressed since the prior CT. Musculoskeletal: Similar appearance of treated right iliac treated metastatic disease. No acute osseous pathology. A 3 x 4 cm fluid density with fat fluid level in the left breast similar to prior CT, likely a postsurgical or posttreatment changes. IMPRESSION: 1. Interval development of mild right hydronephrosis likely related to compression of the distal right ureter within the pelvis. 2. Extensive edema in the right lower extremity likely related to compression of the right iliac veins in the pelvis. 3. Focal area of thickened appearance of the right posterior bladder. Further evaluation with cystoscopy is recommended. 4. Enlarged myomatous uterus with fluid distended endometrium concerning for obstruction or stricture of the endocervical canal. 5. Retroperitoneal adenopathy similar to prior CT. 6. Cholelithiasis. 7. Similar appearance of treated right iliac metastatic disease. 8.  Aortic Atherosclerosis (ICD10-I70.0). Electronically Signed   By: Anner Crete M.D.   On: 09/18/2022 23:24     The results of significant diagnostics from this hospitalization (including imaging, microbiology, ancillary and laboratory) are listed below for reference.     Microbiology: Recent Results (from the past 240 hour(s))  Surgical pcr screen     Status: Abnormal   Collection Time: 09/24/22  4:37 PM   Specimen: Nasal Mucosa; Nasal Swab  Result Value Ref Range Status   MRSA, PCR NEGATIVE NEGATIVE Final   Staphylococcus aureus POSITIVE (A) NEGATIVE Final    Comment: (NOTE) The  Xpert SA Assay (FDA approved for NASAL specimens in patients 68 years of age and older), is one component of a comprehensive surveillance program. It is not intended to diagnose infection nor to guide or monitor treatment. Performed at [facility], [place] [address], [place], [place] [address]   Urine Culture (for pregnant, neutropenic or urologic patients or patients with an indwelling urinary catheter)     Status: None   Collection Time: 10/01/22  2:06 PM   Specimen: Urine, Catheterized  Result Value Ref Range Status   Specimen Description   Final    URINE, CATHETERIZED Performed at [facility], [address] [place], [place] [address]    Special Requests   Final    NONE Performed at [facility], [place] [address], [place], [place] [address]    Culture   Final    NO GROWTH Performed at [facility], [place] [address], [place], [place] [address]    Report Status 10/02/2022 FINAL  Final  MRSA Next Gen by PCR, Nasal     Status: None   Collection Time: 10/01/22  3:43 PM   Specimen: Nasal Mucosa; Nasal Swab  Result Value Ref Range Status   MRSA by PCR Next Gen NOT DETECTED NOT DETECTED Final    Comment: (NOTE) The GeneXpert MRSA Assay (FDA approved for NASAL specimens only), is one component of a comprehensive MRSA colonization surveillance program. It is not intended to diagnose MRSA infection nor to guide or monitor treatment for MRSA infections. Test performance is not FDA approved in patients less than 50 years old. Performed at [facility], [place] [address] [place], [place] [address]   Aerobic Culture w Gram Stain (superficial specimen)     Status: None (Preliminary result)   Collection Time: 10/01/22  6:10 PM   Specimen: Wound  Result Value Ref Range Status   Specimen Description   Final    WOUND GROIN RIGHT Performed at [place] [address] [place], [place] [address]     Special Requests   Final    Immunocompromised Performed at [facility], [place] [address], [place], [place] [address]    Gram Stain   Final    RARE WBC PRESENT, PREDOMINANTLY MONONUCLEAR MODERATE GRAM POSITIVE COCCI IN PAIRS IN SINGLES FEW GRAM NEGATIVE RODS FEW GRAM POSITIVE RODS    Culture   Final    CULTURE REINCUBATED FOR BETTER GROWTH Performed at [facility], [place] [address], [place], [place] [address]    Report Status PENDING  Incomplete  Culture, blood (Routine X 2) w Reflex to ID Panel     Status: None (Preliminary result)   Collection Time: 10/01/22  9:55 PM   Specimen: BLOOD  Result Value Ref Range Status   Specimen Description   Final    BLOOD BLOOD RIGHT HAND Performed at [facility] [address], [place], [place] [address]    Special Requests   Final    BOTTLES DRAWN AEROBIC ONLY Blood Culture adequate volume Performed at [facility] [address], [place], [place] [address]    Culture   Final    NO GROWTH 1 DAY Performed at [facility], [place] [address] [place], [place] [address]    Report Status PENDING  Incomplete  Culture, blood (Routine X 2) w Reflex to ID Panel     Status: None (Preliminary result)   Collection Time: 10/01/22 10:00 PM   Specimen: BLOOD  Result Value Ref Range Status   Specimen Description   Final    BLOOD BLOOD RIGHT WRIST Performed at [facility] [address] [place], [place] [address]    Special Requests   Final    BOTTLES DRAWN AEROBIC ONLY Blood Culture adequate volume Performed at [facility] [address], [place], [place] [address]    Culture   Final    NO GROWTH 1 DAY Performed at [facility], [place] [address] [place], [place] [address]    Report Status PENDING  Incomplete     Labs:  BNP (last 3 results) No results for input(s): "BNP" in the last 8760 hours. Basic Metabolic Panel: Recent Labs   Lab 10/01/22 0913 10/01/22 0925 10/02/22 0611 10/03/22 0551  NA 135 139 136 136  K 4.9 5.1 4.4 4.2  CL 111 111 111 110  CO2 20*  --  16* 19*  GLUCOSE 109* 102* 88 86  BUN 42* 34* 28* 21  CREATININE 2.02* 2.10* 1.33* 1.19*  CALCIUM 7.7*  --  7.9* 8.3*  MG  --   --  2.4 2.5*   Liver Function Tests: Recent Labs  Lab 10/01/22 0913  AST 43*  ALT 20  ALKPHOS 118  BILITOT 0.5  PROT 5.8*  ALBUMIN 2.4*   No results for input(s): "LIPASE", "AMYLASE" in the last 168 hours. No results for input(s): "AMMONIA" in the last 168 hours. CBC: Recent Labs  Lab 10/01/22 0925 10/01/22 1038 10/02/22 0611 10/03/22 0551  WBC  --  8.7 7.0 7.8  NEUTROABS  --  6.2 4.7  --   HGB 9.9* 9.6* 8.9* 9.5*  HCT 29.0* 30.9* 29.4* 29.9*  MCV  --  97.2 98.3 94.0  PLT  --  455* 387 400   Cardiac Enzymes: No results for input(s): "CKTOTAL", "CKMB", "CKMBINDEX", "TROPONINI" in the last 168 hours. BNP: Invalid input(s): "POCBNP" CBG: No results for input(s): "GLUCAP" in the last 168 hours. D-Dimer No results for input(s): "DDIMER" in the last 72 hours. Hgb A1c No results for input(s): "HGBA1C" in the last 72 hours. Lipid Profile No results for input(s): "CHOL", "HDL", "LDLCALC", "TRIG", "CHOLHDL", "LDLDIRECT" in the last 72 hours. Thyroid function studies No results for input(s): "TSH", "T4TOTAL", "T3FREE", "THYROIDAB" in the last 72 hours.  Invalid input(s): "FREET3" Anemia work up No results for input(s): "VITAMINB12", "FOLATE", "FERRITIN", "TIBC", "IRON", "RETICCTPCT" in the last 72 hours. Urinalysis    Component Value Date/Time   COLORURINE YELLOW 10/01/2022 1406   APPEARANCEUR CLEAR 10/01/2022 1406   LABSPEC 1.016 10/01/2022 1406   LABSPEC 1.030 04/05/2020 1009   PHURINE 5.0 10/01/2022 1406   GLUCOSEU NEGATIVE 10/01/2022 1406   HGBUR NEGATIVE 10/01/2022 1406   BILIRUBINUR NEGATIVE 10/01/2022 1406   BILIRUBINUR negative 04/05/2020 1009   KETONESUR NEGATIVE 10/01/2022 1406    PROTEINUR NEGATIVE 10/01/2022 1406   NITRITE NEGATIVE 10/01/2022 1406   LEUKOCYTESUR NEGATIVE 10/01/2022 1406   Sepsis Labs Recent Labs  Lab 10/01/22 1038 10/02/22 0611 10/03/22 0551  WBC 8.7 7.0 7.8   Microbiology Recent Results (from the past 240 hour(s))  Surgical pcr screen     Status: Abnormal   Collection Time: 09/24/22  4:37 PM   Specimen: Nasal Mucosa; Nasal Swab  Result Value Ref Range Status   MRSA, PCR NEGATIVE NEGATIVE Final   Staphylococcus aureus POSITIVE (A) NEGATIVE Final    Comment: (NOTE) The Xpert SA Assay (FDA approved for NASAL specimens in patients 14 years of age and older), is one component of a comprehensive surveillance program. It is not intended to diagnose infection nor to guide or monitor treatment. Performed at [facility], [place] [address], [place], [place] [address]   Urine Culture (for pregnant, neutropenic or urologic patients or patients with an indwelling urinary catheter)     Status: None   Collection Time: 10/01/22  2:06 PM   Specimen: Urine, Catheterized  Result Value Ref Range Status   Specimen Description   Final    URINE, CATHETERIZED Performed at [facility] [address], [place], [place] [address]  Special Requests   Final    NONE Performed at [facility], [place] [address], [place], [place] [address]    Culture   Final    NO GROWTH Performed at [facility], [place] [address], [place], [place] [address]    Report Status 10/02/2022 FINAL  Final  MRSA Next Gen by PCR, Nasal     Status: None   Collection Time: 10/01/22  3:43 PM   Specimen: Nasal Mucosa; Nasal Swab  Result Value Ref Range Status   MRSA by PCR Next Gen NOT DETECTED NOT DETECTED Final    Comment: (NOTE) The GeneXpert MRSA Assay (FDA approved for NASAL specimens only), is one component of a comprehensive MRSA colonization surveillance program. It is not intended to diagnose MRSA infection nor to guide or  monitor treatment for MRSA infections. Test performance is not FDA approved in patients less than 67 years old. Performed at [facility], [place] [address], [place], [place] [address]   Aerobic Culture w Gram Stain (superficial specimen)     Status: None (Preliminary result)   Collection Time: 10/01/22  6:10 PM   Specimen: Wound  Result Value Ref Range Status   Specimen Description   Final    WOUND GROIN RIGHT Performed at [facility] [address], [place], [place] [address]    Special Requests   Final    Immunocompromised Performed at [facility], [place] [address], [place], [place] [address]    Gram Stain   Final    RARE WBC PRESENT, PREDOMINANTLY MONONUCLEAR MODERATE GRAM POSITIVE COCCI IN PAIRS IN SINGLES FEW GRAM NEGATIVE RODS FEW GRAM POSITIVE RODS    Culture   Final    CULTURE REINCUBATED FOR BETTER GROWTH Performed at [facility], [place] [address], [place], [place] [address]    Report Status PENDING  Incomplete  Culture, blood (Routine X 2) w Reflex to ID Panel     Status: None (Preliminary result)   Collection Time: 10/01/22  9:55 PM   Specimen: BLOOD  Result Value Ref Range Status   Specimen Description   Final    BLOOD BLOOD RIGHT HAND Performed at [facility] [address] [place], [place] [address]    Special Requests   Final    BOTTLES DRAWN AEROBIC ONLY Blood Culture adequate volume Performed at [facility] [address] [place], [place] [address]    Culture   Final    NO GROWTH 1 DAY Performed at [facility], [place] [address], [place], [place] [address]    Report Status PENDING  Incomplete  Culture, blood (Routine X 2) w Reflex to ID Panel     Status: None (Preliminary result)   Collection Time: 10/01/22 10:00 PM   Specimen: BLOOD  Result Value Ref Range Status   Specimen Description   Final    BLOOD BLOOD RIGHT WRIST Performed at [facility] [address], [place], [place] [address]    Special Requests   Final    BOTTLES DRAWN AEROBIC ONLY Blood Culture adequate volume Performed at [facility] [address] [place], [place] [address]    Culture   Final    NO GROWTH 1 DAY Performed at [facility], [place] [address] [place], [place] [address]    Report Status PENDING  Incomplete     Time coordinating discharge:  I have spent 35 minutes  face to face with the patient and on the ward discussing the patients care, assessment, plan and disposition with other care givers. >50% of the time was devoted counseling the patient about the risks and benefits of treatment/Discharge disposition and coordinating care.   SIGNED:   Damita Lack, MD  [facility] 10/03/2022, 12:50 PM   If 7PM-7AM, please contact night-coverage

## 2022-10-03 NOTE — Plan of Care (Signed)
  Problem: Education: Goal: Understanding of CV disease, CV risk reduction, and recovery process will improve Outcome: Progressing Goal: Individualized Educational Video(s) Outcome: Progressing   Problem: Activity: Goal: Ability to return to baseline activity level will improve Outcome: Progressing   Problem: Cardiovascular: Goal: Ability to achieve and maintain adequate cardiovascular perfusion will improve Outcome: Progressing Goal: Vascular access site(s) Level 0-1 will be maintained Outcome: Progressing   Problem: Health Behavior/Discharge Planning: Goal: Ability to safely manage health-related needs after discharge will improve Outcome: Progressing   Problem: Education: Goal: Knowledge of General Education information will improve Description: Including pain rating scale, medication(s)/side effects and non-pharmacologic comfort measures Outcome: Progressing   Problem: Health Behavior/Discharge Planning: Goal: Ability to manage health-related needs will improve Outcome: Progressing   Problem: Clinical Measurements: Goal: Ability to maintain clinical measurements within normal limits will improve Outcome: Progressing Goal: Will remain free from infection Outcome: Progressing Goal: Diagnostic test results will improve Outcome: Progressing Goal: Respiratory complications will improve Outcome: Progressing Goal: Cardiovascular complication will be avoided Outcome: Progressing   Problem: Activity: Goal: Risk for activity intolerance will decrease Outcome: Progressing   Problem: Nutrition: Goal: Adequate nutrition will be maintained Outcome: Progressing   Problem: Coping: Goal: Level of anxiety will decrease Outcome: Progressing   Problem: Elimination: Goal: Will not experience complications related to bowel motility Outcome: Progressing Goal: Will not experience complications related to urinary retention Outcome: Progressing   Problem: Pain Managment: Goal:  General experience of comfort will improve Outcome: Progressing   Problem: Safety: Goal: Ability to remain free from injury will improve Outcome: Progressing   Problem: Skin Integrity: Goal: Risk for impaired skin integrity will decrease Outcome: Progressing   Problem: Education: Goal: Ability to describe self-care measures that may prevent or decrease complications (Diabetes Survival Skills Education) will improve Outcome: Progressing Goal: Individualized Educational Video(s) Outcome: Progressing   Problem: Coping: Goal: Ability to adjust to condition or change in health will improve Outcome: Progressing   Problem: Fluid Volume: Goal: Ability to maintain a balanced intake and output will improve Outcome: Progressing   Problem: Health Behavior/Discharge Planning: Goal: Ability to identify and utilize available resources and services will improve Outcome: Progressing Goal: Ability to manage health-related needs will improve Outcome: Progressing   Problem: Metabolic: Goal: Ability to maintain appropriate glucose levels will improve Outcome: Progressing   Problem: Nutritional: Goal: Maintenance of adequate nutrition will improve Outcome: Progressing Goal: Progress toward achieving an optimal weight will improve Outcome: Progressing   Problem: Skin Integrity: Goal: Risk for impaired skin integrity will decrease Outcome: Progressing   Problem: Tissue Perfusion: Goal: Adequacy of tissue perfusion will improve Outcome: Progressing   

## 2022-10-04 ENCOUNTER — Ambulatory Visit: Payer: 59 | Admitting: Rehabilitation

## 2022-10-04 ENCOUNTER — Telehealth: Payer: Self-pay

## 2022-10-04 NOTE — Transitions of Care (Post Inpatient/ED Visit) (Signed)
   10/04/2022  Name: Kristin Ward MRN: [id number] DOB: 1956/05/09  Today's TOC FU Call Status: Today's TOC FU Call Status:: Unsuccessul Call (1st Attempt) Unsuccessful Call (1st Attempt) Date: 10/04/22  Attempted to reach the patient regarding the most recent Inpatient/ED visit.  Follow Up Plan: Additional outreach attempts will be made to reach the patient to complete the Transitions of Care (Post Inpatient/ED visit) call.     Enzo Montgomery, RN,BSN,CCM [facility] Care Management Care Management Community Coordinator Direct Phone: [phone] Toll Free: [phone] Fax: [phone]

## 2022-10-04 NOTE — Transitions of Care (Post Inpatient/ED Visit) (Signed)
   10/04/2022  Name: Kristin Ward [facility] MRN: [id number] DOB: 1955/11/10  Today's TOC FU Call Status: Today's TOC FU Call Status:: Unsuccessful Call (2nd Attempt) Unsuccessful Call (2nd Attempt) Date: 10/04/22  Attempted to reach the patient regarding the most recent Inpatient/ED visit.  Follow Up Plan: Additional outreach attempts will be made to reach the patient to complete the Transitions of Care (Post Inpatient/ED visit) call.    Enzo Montgomery, RN,BSN,CCM [facility] Care Management Care Management Community Coordinator Direct Phone: [phone] Toll Free: [phone] Fax: [phone]

## 2022-10-05 LAB — AEROBIC CULTURE W GRAM STAIN (SUPERFICIAL SPECIMEN)

## 2022-10-07 ENCOUNTER — Ambulatory Visit: Payer: Self-pay

## 2022-10-07 ENCOUNTER — Encounter: Payer: Medicare Other | Admitting: Genetic Counselor

## 2022-10-07 ENCOUNTER — Ambulatory Visit: Payer: 59

## 2022-10-07 ENCOUNTER — Other Ambulatory Visit: Payer: Medicare Other

## 2022-10-07 ENCOUNTER — Telehealth: Payer: Self-pay

## 2022-10-07 DIAGNOSIS — M6281 Muscle weakness (generalized): Secondary | ICD-10-CM | POA: Insufficient documentation

## 2022-10-07 DIAGNOSIS — T451X5A Adverse effect of antineoplastic and immunosuppressive drugs, initial encounter: Secondary | ICD-10-CM | POA: Insufficient documentation

## 2022-10-07 DIAGNOSIS — R262 Difficulty in walking, not elsewhere classified: Secondary | ICD-10-CM | POA: Diagnosis not present

## 2022-10-07 DIAGNOSIS — G620 Drug-induced polyneuropathy: Secondary | ICD-10-CM | POA: Insufficient documentation

## 2022-10-07 DIAGNOSIS — I890 Lymphedema, not elsewhere classified: Secondary | ICD-10-CM | POA: Diagnosis not present

## 2022-10-07 DIAGNOSIS — R293 Abnormal posture: Secondary | ICD-10-CM | POA: Insufficient documentation

## 2022-10-07 LAB — CULTURE, BLOOD (ROUTINE X 2)
Culture: NO GROWTH
Culture: NO GROWTH
Special Requests: ADEQUATE
Special Requests: ADEQUATE

## 2022-10-07 NOTE — Chronic Care Management (AMB) (Signed)
   10/07/2022  [place] Sep 12, 1955 [id number]   Reason for Encounter: Change in CCM enrollment status   Horris Latino RN Care Manager/Chronic Care Management [phone]

## 2022-10-07 NOTE — Transitions of Care (Post Inpatient/ED Visit) (Signed)
   10/07/2022  Name: Kristin Ward MRN: [id number] DOB: 1956-06-27  Today's TOC FU Call Status: Today's TOC FU Call Status:: Unsuccessful Call (3rd Attempt) Unsuccessful Call (3rd Attempt) Date: 10/07/22  Attempted to reach the patient regarding the most recent Inpatient/ED visit.  Follow Up Plan: No further outreach attempts will be made at this time. We have been unable to contact the patient.     Enzo Montgomery, RN,BSN,CCM [facility] Care Management Care Management Community Coordinator Direct Phone: [phone] Toll Free: [phone] Fax: [phone]

## 2022-10-07 NOTE — Therapy (Addendum)
OUTPATIENT PHYSICAL THERAPY ONCOLOGY TREATMENT  Patient Name: Kristin Ward MRN: [id number] DOB:Aug 10, 1956, 67 y.o., female Today's Date: 10/07/2022  END OF SESSION:  PT End of Session - 10/07/22 0857     Visit Number 6    Number of Visits 24    Date for PT Re-Evaluation 10/24/22    PT Start Time 0900    PT Stop Time 0952    PT Time Calculation (min) 52 min    Activity Tolerance Patient tolerated treatment well    Behavior During Therapy [facility] for tasks assessed/performed              Past Medical History:  Diagnosis Date   Abnormal x-ray of lungs with single pulmonary nodule 03/15/2016   per records from [place]    Anemia    Asthma    Cholelithiasis 05/19/2017   On CT   Coronary artery calcification seen on CAT scan 05/19/2017   Diabetes mellitus without complication ([place])    type 2   Diabetes mellitus, new onset ([place]) 01/20/2019   Dilated idiopathic cardiomyopathy ([place]) 12/2015   EF 15-20%. Diagnosed in [facility]   Headache    NONE RECENT   Hypertension    Lymph nodes enlarged 07/25/2020   Malignant neoplasm of lower-outer quadrant of left breast of female, estrogen receptor negative ([place]) 06/11/2017   left breast   Mixed hyperlipidemia 06/17/2018   partially blind    Personal history of chemotherapy 2018-2019   Personal history of radiation therapy 2019   Retinitis pigmentosa    Retroperitoneal lymphadenopathy 07/25/2020   Uveitis    Past Surgical History:  Procedure Laterality Date   brain cyst removed  2007   to help with headaches per patient , aspirated    BREAST BIOPSY Left 06/06/2017   x2   BREAST CYST ASPIRATION Left 06/06/2017   BREAST LUMPECTOMY Left 06/26/2017   BREAST LUMPECTOMY WITH RADIOACTIVE SEED AND SENTINEL LYMPH NODE BIOPSY Left 06/26/2017   Procedure: BREAST LUMPECTOMY WITH RADIOACTIVE SEED AND SENTINEL LYMPH NODE BIOPSY ERAS  PATHWAY;  Surgeon: [name], MD;  Location: [place];  Service: General;   Laterality: Left;  pec block   FINE NEEDLE ASPIRATION Left 02/23/2019   Procedure: FINE NEEDLE ASPIRATION;  Surgeon: [name], MD;  Location: [place];  Service: General;  Laterality: Left;  Asiration of left breast seroma    IR IMAGING GUIDED PORT INSERTION  10/13/2020   IR IVUS EACH ADDITIONAL NON CORONARY VESSEL  09/24/2022   IR IVUS EACH ADDITIONAL NON CORONARY VESSEL  09/24/2022   IR TRANSCATH PLC STENT  EA ADD VEIN  INC ANGIOPLASTY  09/25/2022   IR TRANSCATH PLC STENT  INITIAL VEIN  INC ANGIOPLASTY  09/24/2022   IR US GUIDE VASC ACCESS RIGHT  09/24/2022   IR VENO/EXT/BI  09/23/2022   IR VENO/EXT/UNI LEFT  09/24/2022   IR VENO/EXT/UNI RIGHT  09/24/2022   IR VENOCAVAGRAM IVC  09/25/2022   NASAL TURBINATE REDUCTION     PORT-A-CATH REMOVAL Right 02/23/2019   Procedure: REMOVAL PORT-A-CATH;  Surgeon: [name], MD;  Location: [place];  Service: General;  Laterality: Right;   PORTACATH PLACEMENT N/A 06/26/2017   Procedure: INSERTION PORT-A-CATH;  Surgeon: [name], MD;  Location: [place];  Service: General;  Laterality: N/A;   RADIOLOGY WITH ANESTHESIA Right 09/24/2022   Procedure: ARTERIOGRAM RIGHT LEG;  Surgeon: Radiologist, Medication, MD;  Location: [place];  Service: Radiology;  Laterality: Right;   RIGHT/LEFT HEART CATH AND CORONARY  ANGIOGRAPHY N/A 07/31/2022   Procedure: RIGHT/LEFT HEART CATH AND CORONARY ANGIOGRAPHY;  Surgeon: Jolaine Artist, MD;  Location: [place] [facility];  Service: Cardiovascular;  Laterality: N/A;   TRANSTHORACIC ECHOCARDIOGRAM  12/2015   A) [facility] May 2017: Mild concentric LVH. Global hypokinesis. GR daily. EF 18% severe LA dilation. Mitral annular dilatation with papillary muscle dysfunction and moderate MR. Dilated IVC consistent with elevated RAP.   TRANSTHORACIC ECHOCARDIOGRAM  05/2017; 08/2017   a) Mild concentric LVH.  EF 45 to 50% with diffuse hypokinesis.  GR 1 DD.  Mild aortic root dilation.;; b)   Mild LVH EF 45 to 50%.  Diffuse HK.  No significant valve disease.  No significant change.   TRANSTHORACIC ECHOCARDIOGRAM  01/30/2022   Pre[place]:EF 35 to 40%.  Moderate dysfunction with global HK.  Abnormal strain.  Normal RV size and function.  Normal RVSP.  Aortic root estimated 42 mm.  Mild MR.  Mild/trivial AI.  Normal RVP.;  04/01/2022: Preliminary read 40%.  Formal read 45 to 50% (probably 40 to 45%).  Global HK.  GR 1 DD.  Normal RV size and RVP.  Mild MR.  Now measured at 38 mm.  Normal RAP.   Patient Active Problem List   Diagnosis Date Noted   Syncope and collapse 10/01/2022   Hypotension 10/01/2022   AKI (acute kidney injury) ([place]) 10/01/2022   Dehydration 10/01/2022   Malignant neoplasm of lower-outer quadrant of left female breast ([place]) 10/01/2022   Vulvar mass 09/26/2022   Intractable pain 09/18/2022   Type 2 diabetes mellitus ([place]) 09/18/2022   Edema of right lower extremity 09/18/2022   Leukopenia 09/18/2022   Peripheral neuropathy due to chemotherapy ([place]) 07/26/2022   Cancer related pain 07/26/2022   Swelling 04/16/2022   Constipation 02/21/2022   Hypertension associated with diabetes ([place]) 11/26/2021   Class 1 obesity due to excess calories with serious comorbidity and body mass index (BMI) of 33.0 to 33.9 in adult 11/26/2021   Pain due to onychomycosis of toenails of both feet 11/21/2021   Neuropathy associated with cancer ([place]) 11/21/2021   History of chemotherapy 09/14/2021   Uncontrolled type 2 diabetes mellitus with hyperglycemia ([place]) 03/08/2021   Neuropathy 01/12/2021   Situational depression 01/12/2021   Goals of care, counseling/discussion 09/15/2020   Malignant neoplasm metastatic to bone ([place]) 08/30/2020   Pain from bone metastases ([place]) 08/30/2020   Right hip pain 08/22/2020   Lymph nodes enlarged 07/25/2020   Retroperitoneal lymphadenopathy 07/25/2020   History of breast cancer 05/11/2020   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   Chronic right  hip pain 04/05/2020   Vitamin D deficiency 10/07/2019   Elevated serum creatinine 10/07/2019   Hyperlipidemia 06/17/2018   Influenza vaccination declined 06/17/2018   Obesity (BMI 30-39.9) 06/17/2018   Port-A-Cath in place 09/08/2017   Other fatigue 08/29/2017   Anemia 08/29/2017   High risk medication use 08/29/2017   Syncope 08/25/2017   Blind in both eyes 08/25/2017   Genetic testing 06/19/2017   Family history of colon cancer    Malignant neoplasm of lower-outer quadrant of left breast of female, estrogen receptor negative ([place]) 06/11/2017   Cholelithiasis 05/19/2017   Coronary artery calcification seen on CAT scan 05/19/2017   Low grade squamous intraepith lesion on cytologic smear cervix ([place]) 04/09/2017   HPV (human papilloma virus) infection 04/09/2017   COPD (chronic obstructive pulmonary disease) ([place])    Essential hypertension 03/20/2017   Screen for colon cancer 03/20/2017   Retinitis pigmentosa  Blind    Solitary pulmonary nodule on lung CT 03/16/2016   Abnormal myocardial perfusion study 03/15/2016   Chronic combined systolic and diastolic heart failure ([place]) 12/28/2015   Dilated cardiomyopathy ([place]) 12/27/2015    PCP: Dr Jill Alexanders  REFERRING PROVIDER: Cira Rue, NP  REFERRING DIAG: Chemo induced peripheral neuropathy  THERAPY DIAG:  Chemotherapy-induced neuropathy ([place])  Abnormal posture  Muscle weakness (generalized)  Lymphedema, not elsewhere classified  Difficulty in walking, not elsewhere classified  ONSET DATE: 2019 with exacerbation  Rationale for Evaluation and Treatment: Rehabilitation  SUBJECTIVE:                                                                                                                                                                                           SUBJECTIVE STATEMENT: I was hospitalized for dehydration for several days to get fluids in me. They said it is from the pain killers.  It feels like the  swelling is coming down some.  I have been in the bed with my leg up. I have been on antibiotics for the wound area at my groin. My daughter cares for the wound at home. The odor is gone. I start a new chemo on Thursday.  PERTINENT HISTORY:  Pt had a left lumpectomy with SLNB on 06/26/2017 for triple negative cancer with 0/3 LN's. She had adjuvant chemotherapy for 4 cycles followed by radiation. She had subsequent bone metastasis and had palliative radiation to the right iliac region in Jan-Feb 2022. She also had chemotherapy with Taxol in Feb 2022 stopped after the 4th cycle due to neuropathy and was switched to Xeloda. In November of 2022 she was noted to have disease progression with increased number and size of enlarged LN's in retroperitoneum, pelvic sidewall and upper thigh.. She progressed on [place] and most recently [place] ending 07/05/2022. She developed moderate neuropathy and it was stopped. She had a great response but now wants to try 1 month without treatment.. Negative doppler for DVT regarding swelling in Right LE  PAIN:  Are you having pain? Yes NPRS scale: 3/10 Right LE with  leg down, 0/10 with leg elevated Pain location: right leg Pain orientation: Right  PAIN TYPE: whole leg is  numb Pain description: constant  Aggravating factors: sitting gets more swollen, standing, walking Relieving factors: laying down with leg elevated  PRECAUTIONS: chronic systolic and diastolic heart failure EF 45-50%, right LE swelling, DM, hypertension, COPD,retinitis pigmentosa (limits vision legally blind) left UE lymphedema risk, bone metastasis  WEIGHT BEARING RESTRICTIONS: No  FALLS:  Has patient fallen in last 6 months? No  LIVING ENVIRONMENT: Lives with: lives  with their family, lives with their son, and lives with their daughter Lives in: House/apartment Stairs: Yes; External: 4 steps; can reach both Has following equipment at home: Single point cane and shower chair,  blind cane  OCCUPATION: retired Marine scientist  LEISURE: bowling, crafts,puzzles  HAND DOMINANCE: right   PRIOR LEVEL OF FUNCTION: Independent with household mobility with device  PATIENT GOALS: be more active, get swelling in right LE down,improve balance/gait   OBJECTIVE:  COGNITION: Overall cognitive status: Within functional limits for tasks assessed   PALPATION: Moderate pitting edema right lower leg, less so in upper leg  OBSERVATIONS / OTHER ASSESSMENTS: Significant right LE swelling foot to groin limiting mobility, ambulation, standing and sitting  SENSATION: Light touch: Deficits pt has sensation but it is decreased bilateral LE's   POSTURE: forward head, rounded shoulders  LOWER EXTREMITY AROM/PROM:  A/PROM Right eval  Hip flexion   Hip extension   Hip abduction   Hip adduction   Hip internal rotation   Hip external rotation   Knee flexion   Knee extension   Ankle dorsiflexion   Ankle plantarflexion   Ankle inversion   Ankle eversion    (Blank rows = not tested)  A/PROM LEFT eval  Hip flexion   Hip extension   Hip abduction   Hip adduction   Hip internal rotation   Hip external rotation   Knee flexion   Knee extension   Ankle dorsiflexion   Ankle plantarflexion   Ankle inversion   Ankle eversion    (Blank rows = not tested)  LOWER EXTREMITY MMT:   LYMPHEDEMA ASSESSMENTS:  SURGERY TYPE/DATE: 06/26/2017 Left lumpectomy with SLNB for Triple Negative cancer NUMBER OF LYMPH NODES REMOVED: 0/3 CHEMOTHERAPY: Yes, multiple for breast cancer and metastasis RADIATION:YES for breast and iliac regions due to metastasis HORMONE TREATMENT: NO INFECTIONS: NO  LYMPHEDEMA ASSESSMENTS:         LOWER EXTREMITY LANDMARK RIGHT eval 09/06/22 09/16/2022 10/07/2022  At groin 83 86 88 88  30 cm proximal to suprapatella      20 cm proximal to suprapatella 77 78.5    10 cm proximal to suprapatella 65.5 62    At midpatella / popliteal crease 57.5 57.5 54 56  30 cm  proximal to floor at lateral plantar foot 43.5 43.8 47.2 48.7  20 cm proximal to floor at lateral plantar foot 32.8 32.7 34.1 34.4  10 cm proximal to floor at lateral plantar foot 27.3   29.3  Circumference of ankle/heel      5 cm proximal to 1st MTP joint      Across MTP joint 24     Around proximal great toe 8.5     (Blank rows = not tested)  LOWER EXTREMITY LANDMARK LEFT eval  At groin 67  30 cm proximal to suprapatella   20 cm proximal to suprapatella 63.8  10 cm proximal to suprapatella 52.5  At midpatella / popliteal crease 42.2  30 cm proximal to floor at lateral plantar foot 35.5  20 cm proximal to floor at lateral plantar foot 16.0  10 cm proximal to floor at lateral plantar foot 14.1  Circumference of ankle/heel   5 cm proximal to 1st MTP joint   Across MTP joint 22.5  Around proximal great toe 7.9  (Blank rows = not tested)  FUNCTIONAL TESTS:  30 sec sit to stand  9 reps using arms on chair TUG: 16 sec, 18 sec with use of arms and cane Balance: DLS able to  maintain 10 sec, half tandem left forward 7 seconds, right forward 10 sec  GAIT: Distance walked: 40 feet Assistive device utilized: Single point canehandle backwards and using on right side Level of assistance: Complete Independence Comments: lurching type gait, excessive lateral motion decreased heel strike, toe off,   TODAY'S TREATMENT:                                                                                                                                         DATE:   10/07/2022 Pt arrived 1 hour early. Brought to empty treatment room so she could elevate legs Pts lower leg measured in supine due to increased pain with legs in dependent position and knee/upper thigh measured in standing at end of rx MLD: Short neck, 5 diaphragmatic breaths , Rt axillary nodes, Rt inguino-axillary anastomosis, then worked on Rt upper leg on lateral thigh, medial to lateral and then lateral again, knee, lower leg on  anterior and posterior surfaces then ended at ankle and then retraced all steps to back to anastomosis and axillary LN's; pt had to shift positions multiple times to get her leg comfortable. While performing MLD therapist checked wound area at groin. Uncovered and with skin sloughing with strong odor. Advised pt to speak with MD 09/16/2022 Pt remeasured in several places with increased edema noted throughout. Leg remains very limiting pts mobility and very painful when sitting with her leg in a dependent position. Setup appt at 3:30 for pt to be measured for Circaid reduction kit with Primitivo Gauze on Wednesday.  MLD: Short neck, 5 diaphragmatic breaths , Rt axillary nodes, Rt inguino-axillary anastomosis, then worked on Rt upper leg on lateral thigh, medial to lateral and then lateral again, knee, lower leg on anterior and posterior surfaces then ended at ankle and then retraced all steps to back to anastomosis and axillary LN's; pt had to shift positions multiple times to get her leg comfortable. She did have to use the rest room immediately after and noted her leg felt better after treatment.   09/06/22: Pt arrives with increased pain 9/10 and difficulty even walking back to the room.  She reports that she feels increased burning and pain in the Rt thigh which started around 2 hours after last treatment.   Remeasured LE which is around 3cm larger in the upper thigh Discussed with evaluating PT about POC and response to therapy and discussed with pt that we should hold on therapy due to response to treatment and that she may feel better and tolerate things better a few weeks into chemo if there is a space occupying lesion/LN mass.   Discussed use of tg soft (not unless it helps) and focusing on ADLs for exercise for now.   09/04/22: Manual Therapy MLD: Short neck, 5 diaphragmatic breaths (pt with improved technique today), Rt axillary nodes, Rt inguino-axillary anastomosis, then worked on Rt upper leg on  lateral thigh, medial to lateral and then lateral again, knee, lower leg on anterior and posterior surfaces then ended at ankle/foot toes and then retraced all steps to back to anastomosis and axillary LN's; continued instructing pt and having her return demo with hand over hand technique After MLD applied cocoa butter to Rt LE and 2 TG soft stockinettes from last session: large to thigh and medium to lower leg but no compression bandages today as pt did not tolerate bandages from last session  09/02/22: Manual Therapy MLD: Began instructing pt and daughter in basics of lymphatic systema nd principles of MLD while performing following: Short neck, 5 diaphragmatic breaths (cuing for technique here), Lt inguinal and Rt axillary nodes, anterior inter-inguinal and Lt inguino-axillary anastomosis, then worked on Rt upper leg on lateral thigh, medial to latearl and then lateral again and retraced all steps to anastomosis; began instructing pt in this having her return demo Compression Bandaging: Cocoa butter applied to entire Rt LE: TG soft size large to thigh, and then TG soft size med from foot to knee then bandages as follows: Elastomull to toes 1-4, artiflex x1 from foot to knee, and then 1-6 cm to foot ankle (combined Roman sandal and ASH technique with this one bandage to start light pressure) and 1-10 cm from ankle to knee. Pt had some discomfort with donning TG soft on her thigh but once this was in place she reports it feeling much better on her leg. Showed daughter how to be mindful of keeping top band folded over so as to prevent TG soft from rolling down on her leg, especially at posterior thigh. Pt reports bandages feeling comfortable by end of session and was educated in remedial exercises to perform if she notices any increased tingling or pain that isn't from her CIPN. Then if any possible symptoms don't improve to remove them and bring them back.   08/29/2022  PATIENT EDUCATION:  Education  details: Remedial exs with compression bandages on ; self MLD Person educated: Patient and daughter Education method: Explanation, Demonstration Education comprehension: verbalized understanding, returned demo, will benefit from further review  HOME EXERCISE PROGRAM: 09/02/22: Remedial exs and to doff bandages if she has any increased pain/tingling that isn't alleviated by exs; self MLD 1/18/2024None given at eval  ASSESSMENT:  CLINICAL IMPRESSION: Pts leg has not reduced at all since having stents placed and despite pt being elevated most days. Leg is very firm throughout and with odor coming from wound area. She starts chemo on Thursday. She is still asking about velcro wraps. Not sure she will able to tolerate for long. Will message MD about wound area.  OBJECTIVE IMPAIRMENTS: Abnormal gait, decreased activity tolerance, decreased balance, decreased endurance, decreased knowledge of condition, difficulty walking, decreased strength, increased edema, increased muscle spasms, and pain.   ACTIVITY LIMITATIONS: carrying, lifting, sitting, standing, squatting, sleeping, bed mobility, dressing, locomotion level, and normal household chores  PARTICIPATION LIMITATIONS: meal prep, laundry, driving, community activity, and normal household chores  PERSONAL FACTORS: 3+ comorbidities: Left lumpectomy s/p chemo and radiation, bone and LN metastasis, legally blind, DM, CHF ,COPD are also affecting patient's functional outcome.   REHAB POTENTIAL: Good  CLINICAL DECISION MAKING: Evolving/moderate complexity  EVALUATION COMPLEXITY: Moderate  GOALS: Goals reviewed with patient? Yes  SHORT TERM GOALS: Target date: 09/26/2022  Pt/pts children will be independent in compression bandaging to the Right LE Baseline: Goal status: INITIAL  2.  Pt will be tolerant of compression bandaging and will have reduction in Right  LE swelling without any deleterious effects IE: SOB Baseline:  Goal status:  INITIAL  3. Pt will have reduction in swelling at 20 cm above suprapatella by 4 cm  Baseline approx 77 cm Goal Status: INITIAL  4. Pt will have decreased edema at 10 cm prox to floor by4 cm Baseline 12 cm Goal status: initial  LONG TERM GOALS: Target date: 10/23/2022  Pt will have edema reduction at 20 cm prox to suprapatella by 7 cm for easier mobility Baseline:  Goal status: INITIAL  2.  Pt will have edema reduction at 10 cm prox to floor by 6 cm or more to improve mobility Baseline:  Goal status: INITIAL  3.  Pt will be able to perform 14 sit to stands in 30 seconds to decrease fall risk Baseline:  Goal status: INITIAL  4.  Pt will perform TUG in 12 seconds or less to decrease fall risk Baseline:  Goal status: INITIAL  5. Pt will be fit for compression garment to hold right LE swelling  Goal status:INITIAL  PLAN:  PT FREQUENCY: 3x/week  PT DURATION: 8 weeks  PLANNED INTERVENTIONS: Therapeutic exercises, Neuromuscular re-education, Balance training, Gait training, Patient/Family education, Self Care, Orthotic/Fit training, Manual lymph drainage, Compression bandaging, Vasopneumatic device, Manual therapy, and Re-evaluation  PLAN FOR NEXT SESSION: Reassess upon return, MLD to right LE, instruct and issue initial HEP: standing bil LE hip 3 way raises then later start NM, strength, gait exs, assess MMT/ROM prn PHYSICAL THERAPY DISCHARGE SUMMARY  Visits from Start of Care: 6  Current functional level related to goals / functional outcomes: Unknown. Pt did not return. It was recommended due to her pain, wound area and difficulty with transportation that she consider home health care   Remaining deficits: Per notes in chart pt has been referred to hospice care. Chemo was discontinued   Education / Equipment: NA   Patient is  discharged. Patient goals were not met. Patient is being discharged due to a change in medical status.   Claris Pong, PT 10/07/2022, 9:57  AM  Start with circles near neck, 5 times each side   Cancer Rehab [id number] Deep Effective Breath   Standing, sitting, or laying down place both hands on the belly. Take a deep breath IN, expanding the belly; then breath OUT, contracting the belly. Repeat __5__ times. Do __2-3__ sessions per day and before each self massage.  Inguinal Nodes to Axilla - Clear   On involved side, at armpit, make _5__ in-place circles. Then from hip proceed in sections to armpit with stationary circles or pumps _5_ times, this is your pathway. Do _1__ time per day.  Copyright  VHI. All rights reserved.  LEG: Knee to Hip - Clear   Pump up outer thigh of involved leg from knee to outer hip. Then do stationary circles from inner to outer thigh, then do outer thigh again. Next, interlace fingers behind knee IF ABLE and make in-place circles. Do _5_ times of each sequence.  Do _1__ time per day.  Copyright  VHI. All rights reserved.  LEG: Ankle to Hip Sweep   Hands on sides of ankle of involved leg, pump _5__ times up both sides of lower leg, then retrace steps up outer thigh to hip as before and back to pathway. Do _2-3_ times. Do __1_ time per day.  Copyright  VHI. All rights reserved.  FOOT: Dorsum of Foot and Toes Massage   One hand on top of foot make _5_ stationary circles or  pumps, then either on top of toes or each individual toe do _5_ pumps. Then retrace all steps pumping back up both sides of lower leg, outer thigh, and then pathway. Finish with what you started with, _5_ circles at involved side arm pit. All _2-3_ times at each sequence. Do _1__ time per day.

## 2022-10-08 ENCOUNTER — Ambulatory Visit: Payer: 59 | Admitting: Nurse Practitioner

## 2022-10-08 NOTE — Progress Notes (Deleted)
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  Past Medical History    Past Medical History:  Diagnosis Date   Abnormal x-ray of lungs with single pulmonary nodule 03/15/2016   per records from [place]    Anemia    Asthma    Cholelithiasis 05/19/2017   On CT   Coronary artery calcification seen on CAT scan 05/19/2017   Diabetes mellitus without complication ([place])    type 2   Diabetes mellitus, new onset ([place]) 01/20/2019   Dilated idiopathic cardiomyopathy ([place]) 12/2015   EF 15-20%. Diagnosed in [facility]   Headache    NONE RECENT   Hypertension    Lymph nodes enlarged 07/25/2020   Malignant neoplasm of lower-outer quadrant of left breast of female, estrogen receptor negative ([place]) 06/11/2017   left breast   Mixed hyperlipidemia 06/17/2018   partially blind    Personal history of chemotherapy 2018-2019   Personal history of radiation therapy 2019   Retinitis pigmentosa    Retroperitoneal lymphadenopathy 07/25/2020   Uveitis    Past Surgical History:  Procedure Laterality Date   brain cyst removed  2007   to help with headaches per patient , aspirated    BREAST BIOPSY Left 06/06/2017   x2   BREAST CYST ASPIRATION Left 06/06/2017   BREAST LUMPECTOMY Left 06/26/2017   BREAST LUMPECTOMY WITH RADIOACTIVE SEED AND SENTINEL LYMPH NODE BIOPSY Left 06/26/2017   Procedure: BREAST LUMPECTOMY WITH RADIOACTIVE SEED AND SENTINEL LYMPH NODE BIOPSY ERAS  PATHWAY;  Surgeon: [name], MD;  Location: [place];  Service:  General;  Laterality: Left;  pec block   FINE NEEDLE ASPIRATION Left 02/23/2019   Procedure: FINE NEEDLE ASPIRATION;  Surgeon: [name], MD;  Location: [place];  Service: General;  Laterality: Left;  Asiration of left breast seroma    IR IMAGING GUIDED PORT INSERTION  10/13/2020   IR IVUS EACH ADDITIONAL NON CORONARY VESSEL  09/24/2022   IR IVUS EACH ADDITIONAL NON CORONARY VESSEL  09/24/2022   IR TRANSCATH PLC STENT  EA ADD VEIN  INC ANGIOPLASTY  09/25/2022   IR TRANSCATH PLC STENT  INITIAL VEIN  INC ANGIOPLASTY  09/24/2022   IR US GUIDE VASC ACCESS RIGHT  09/24/2022   IR VENO/EXT/BI  09/23/2022   IR VENO/EXT/UNI LEFT  09/24/2022   IR VENO/EXT/UNI RIGHT  09/24/2022   IR VENOCAVAGRAM IVC  09/25/2022   NASAL TURBINATE REDUCTION     PORT-A-CATH REMOVAL Right 02/23/2019   Procedure: REMOVAL PORT-A-CATH;  Surgeon: [name], MD;  Location: [place];  Service: General;  Laterality: Right;   PORTACATH PLACEMENT N/A 06/26/2017   Procedure: INSERTION PORT-A-CATH;  Surgeon: [name], MD;  Location: [place];  Service: General;  Laterality: N/A;   RADIOLOGY WITH ANESTHESIA Right 09/24/2022   Procedure: ARTERIOGRAM RIGHT LEG;  Surgeon: Radiologist, Medication, MD;  Location: [place];  Service: Radiology;  Laterality: Right;   RIGHT/LEFT HEART CATH AND CORONARY ANGIOGRAPHY N/A 07/31/2022   Procedure: RIGHT/LEFT HEART CATH AND CORONARY ANGIOGRAPHY;  Surgeon: Jolaine Artist, MD;  Location: [place] [facility];  Service: Cardiovascular;  Laterality: N/A;   TRANSTHORACIC ECHOCARDIOGRAM  12/2015   A) [facility] May 2017: Mild concentric LVH. Global hypokinesis. GR daily. EF 18% severe LA dilation. Mitral annular dilatation with papillary muscle dysfunction and moderate MR. Dilated IVC consistent with elevated RAP.   TRANSTHORACIC ECHOCARDIOGRAM  05/2017; 08/2017   a) Mild concentric LVH.  EF 45 to 50% with diffuse hypokinesis.  GR 1 DD.  Mild aortic root  dilation.;; b)  Mild LVH EF 45 to 50%.  Diffuse HK.  No significant valve disease.  No significant change.   TRANSTHORACIC ECHOCARDIOGRAM  February 10, 2022   Pre-[place]:EF 35 to 40%.  Moderate dysfunction with global HK.  Abnormal strain.  Normal RV size and function.  Normal RVSP.  Aortic root estimated 42 mm.  Mild MR.  Mild/trivial AI.  Normal RVP.;  04/01/2022: Preliminary read 40%.  Formal read 45 to 50% (probably 40 to 45%).  Global HK.  GR 1 DD.  Normal RV size and RVP.  Mild MR.  Now measured at 38 mm.  Normal RAP.    Allergies  Allergies  Allergen Reactions   Morphine And Related Nausea And Vomiting   Latex Hives, Itching and Other (See Comments)    Burning, too   Tylenol With Codeine #3 [Acetaminophen-Codeine] Nausea And Vomiting   Penicillins Nausea Only, Swelling and Rash    Has patient had a PCN reaction causing immediate rash, facial/tongue/throat swelling, SOB or lightheadedness with hypotension: Yes Has patient had a PCN reaction causing severe rash involving mucus membranes or skin necrosis: Yes Has patient had a PCN reaction that required hospitalization: No Has patient had a PCN reaction occurring within the last 10 years: No TONGUE SWELLING AND RASH AROUND MOUTH If all of the above answers are "NO", then may proceed with Cephalosporin use.      Labs/Other Studies Reviewed    The following studies were reviewed today: TTE 02/10/22:: EF 35 to 40%.  Moderate dysfunction with global HK.  Abnormal strain.  Normal RV size and function.  Normal RVSP.  Aortic root estimated 42 mm.  Mild MR.  Mild/trivial AI.  Normal RVP. TTE 04/01/2022: Preliminary read 40%.  Formal read 45 to 50% (probably 40 to 45%).  Global HK.  GR 1 DD.  Normal RV size and RVP.  Mild MR.  Now measured at 38 mm.  Normal RAP. TTE 06/2022: IMPRESSIONS   1. Left ventricular ejection fraction, by estimation, is 40 to 45%. The  left ventricle has mildly decreased function. The left ventricle  demonstrates global  hypokinesis. There is mild concentric left ventricular  hypertrophy. Left ventricular diastolic  parameters are consistent with Grade I diastolic dysfunction (impaired  relaxation).   2. Right ventricular systolic function is normal. The right ventricular  size is normal.   3. Left atrial size was mildly dilated.   4. The mitral valve is normal in structure. Mild mitral valve  regurgitation. No evidence of mitral stenosis.   5. The aortic valve is normal in structure. Aortic valve regurgitation is  not visualized. No aortic stenosis is present.   6. Aortic dilatation noted. There is borderline dilatation of the  ascending aorta, measuring 39 mm.   7. The inferior vena cava is normal in size with greater than 50%  respiratory variability, suggesting right atrial pressure of 3 mmHg.   R/LHC 07/2022: Findings:   Ao = 120/68 (90) LV = 119/13 RA = 3 RV = 23/5 PA = 20/8 (14) PCW = 8 Fick cardiac  output/index = 9.7/4.5 Thermo CO/CI = 8.9/4.1 PVR = 0.7 Ao sat = 98% PA sat = 75%, 76% SVC sat = 76%   Assessment: 1. Normal left coronary system.  2. Despite multiple attempts unable to locate RCA 3. Normal filling pressures 4. High cardiac output without evidence of intracardiac shunting   Plan/Discussion:   She has non-ischemic cardiomyopathy. Medical therapy.   Recent Labs: 08/14/2022: TSH 5.235 10/01/2022: ALT 20 10/03/2022: BUN 21; Creatinine, Ser 1.19; Hemoglobin 9.5; Magnesium 2.5; Platelets 400; Potassium 4.2; Sodium 136  Recent Lipid Panel    Component Value Date/Time   CHOL 208 (H) 08/07/2022 0838   TRIG 120 08/07/2022 0838   HDL 52 08/07/2022 0838   CHOLHDL 4.0 08/07/2022 0838   CHOLHDL 4.2 03/20/2017 0943   VLDL 21 03/20/2017 0943   LDLCALC 135 (H) 08/07/2022 0838    History of Present Illness    67 year old female with the above past medical history including coronary artery calcification on CT, chronic systolic heart failure, NICM, syncope, hypertension,  hyperlipidemia, type 2 diabetes, 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  She was found to have cardiomyopathy in 2017 in [place] (prior to chemotherapy), EF was 18% at the time.  2018 she was treated for breast cancer s/p lumpectomy adjuvant chemo.  EF eventually recovered to 45 to 50%.  In January 2022 she was found to have recurrent metastatic breast cancer.  Recent echocardiogram in 06/2022 showed EF 40 to 45%, mild concentric LVH, G1 DD, mild mitral valve regurgitation.  She was last seen in the office by Dr. Ellyn Hack on 8/29/223.  She was seen by Dr. Haroldine Laws in the cardio oncology clinic on 07/01/2022.  She underwent R/LHC in 07/2022 which revealed normal coronary arteries, normal filling pressures.  Ongoing medical therapy for NICM was recommended.  She was hospitalized from 09/18/2022 to 09/26/2022 in the setting of worsening right lower extremity edema.  She underwent bilateral extremity venogram which were revealed occluded iliac vein secondary to compression, metastatic cancer around the pelvis and groin region.  Venous duplex was negative for DVT. She underwent venoplasty and stent placement of the bilateral common iliac vein.  Outpatient physical therapy for lymphedema treatment was recommended.  CT of the abdomen pelvis showed focal area of thickened appearance of the right posterior bladder, mild hydronephrosis, patient follow-up with urology/oncology was recommended.  She returned to the ED on 10/01/2022 with ongoing right lower extremity edema, presyncope.  She was hypotensive and noted to be significantly dehydrated.  Creatinine was 2.1 on admission, improved to 1.3 with IV fluids.  Repeat lower extremity duplex was negative for DVT.  Noted to have a right groin wound infection that was treated with p.o. Levaquin.  She was discharged home in stable condition on 10/03/2022.  She presents today for follow-up. Since her most recent hospitalization   Chronic systolic  heart failure/NICM: Presyncope/syncope: Hypertension: Hyperlipidemia: Type 2 diabetes: Metastatic breast cancer: Disposition:  Home Medications    Current Outpatient Medications  Medication Sig Dispense Refill   Accu-Chek FastClix Lancets MISC TEST TWICE A DAY. PT USES AN ACCU-CHEK GUIDE ME METER 102 each 2   amLODipine (NORVASC) 5 MG tablet Take 5 mg by mouth daily as needed (HBP).     Ascorbic Acid (VITAMIN C) 500 MG CAPS Take 500 mg by mouth daily.     aspirin 81 MG chewable tablet Chew 81 mg by mouth daily.     atorvastatin (LIPITOR) 40 MG tablet Take 1 tablet (40 mg total) by  mouth daily. 90 tablet 3   brimonidine (ALPHAGAN) 0.2 % ophthalmic solution Place 1 drop into both eyes 2 (two) times daily.     carvedilol (COREG) 12.5 MG tablet TAKE 1 AND 1/2 TABLETS BY MOUTH TWICE DAILY qm (Patient taking differently: Take 18.75 mg by mouth 2 (two) times daily with a meal.) 90 tablet 10   Cholecalciferol (VITAMIN D) 50 MCG (2000 UT) CAPS Take 2,000 Units by mouth daily.     empagliflozin (JARDIANCE) 10 MG TABS tablet Take 1 tablet (10 mg total) by mouth daily before breakfast. 90 tablet 3   furosemide (LASIX) 20 MG tablet Take 40 mg by mouth daily as needed for fluid (weight gain of 3 -4 lbs).     gabapentin (NEURONTIN) 300 MG capsule Take 3 caps twice a day during the day and 4 caps at bedtime (Patient taking differently: Take 600-900 mg by mouth See admin instructions. Take 2 caps ( 600 mg)  twice a day during the day and 3 caps ( 900 mg)  at bedtime) 300 capsule 1   Lancets Misc. (ACCU-CHEK SOFTCLIX LANCET DEV) KIT 1 Units by Does not apply route 2 (two) times daily. Check FSBS BID - Include strips # 50 with 5 refills, Lancets #50 with 5 refills DX: Type 2 DM - ICD 10: E11.9 1 kit 0   levofloxacin (LEVAQUIN) 500 MG tablet Take 1 tablet (500 mg total) by mouth daily for 5 days. 5 tablet 0   lidocaine-prilocaine (EMLA) cream Apply 1 application topically as needed. 30 g 0   linaclotide  (LINZESS) 72 MCG capsule Take 1 capsule (72 mcg total) by mouth daily as needed (constipation). 30 capsule 2   magic mouthwash (nystatin, lidocaine, diphenhydrAMINE, alum & mag hydroxide) suspension Swish and swallow 5 mLs by mouth 4 (four) times daily as needed for mouth pain (Patient not taking: Reported on 10/02/2022) 140 mL 0   Magnesium 300 MG CAPS Take 300 mg by mouth 2 (two) times a week.     methocarbamol (ROBAXIN) 500 MG tablet Take 1 tablet (500 mg total) by mouth every 8 (eight) hours as needed for up to 14 days for muscle spasms. 42 tablet 0   Multiple Vitamins-Minerals (WOMENS MULTI PO) Take 15 mLs by mouth daily.     ondansetron (ZOFRAN) 8 MG tablet Take 1 tablet (8 mg total) by mouth every 8 (eight) hours as needed for nausea or vomiting (begin on day 3 after chemo). 30 tablet 1   pantoprazole (PROTONIX) 40 MG tablet TAKE 1 TABLET BY MOUTH EVERY DAY (Patient taking differently: Take 40 mg by mouth daily as needed (heartburn).) 90 tablet 1   polyethylene glycol (MIRALAX / GLYCOLAX) 17 g packet Take 17 g by mouth daily. (Patient not taking: Reported on 10/02/2022) 14 each 0   sacubitril-valsartan (ENTRESTO) 97-103 MG Take 1 tablet by mouth 2 (two) times daily. 60 tablet 10   senna-docusate (SENOKOT-S) 8.6-50 MG tablet Take 1 tablet by mouth 2 (two) times daily. 60 tablet 0   spironolactone (ALDACTONE) 25 MG tablet Take 1 tablet (25 mg total) by mouth daily. 30 tablet 10   vitamin B-12 (CYANOCOBALAMIN) 1000 MCG tablet Take 1,000 mcg by mouth daily.     zinc gluconate 50 MG tablet Take 50 mg by mouth daily.     No current facility-administered medications for this visit.     Review of Systems    ***.  All other systems reviewed and are otherwise negative except as noted above.  Physical Exam    VS:  There were no vitals taken for this visit. , BMI There is no height or weight on file to calculate BMI.     GEN: Well nourished, well developed, in no acute distress. HEENT:  normal. Neck: Supple, no JVD, carotid bruits, or masses. Cardiac: RRR, no murmurs, rubs, or gallops. No clubbing, cyanosis, edema.  Radials/DP/PT 2+ and equal bilaterally.  Respiratory:  Respirations regular and unlabored, clear to auscultation bilaterally. GI: Soft, nontender, nondistended, BS + x 4. MS: no deformity or atrophy. Skin: warm and dry, no rash. Neuro:  Strength and sensation are intact. Psych: Normal affect.  Accessory Clinical Findings    ECG personally reviewed by me today - *** - no acute changes.   Lab Results  Component Value Date   WBC 7.8 10/03/2022   HGB 9.5 (L) 10/03/2022   HCT 29.9 (L) 10/03/2022   MCV 94.0 10/03/2022   PLT 400 10/03/2022   Lab Results  Component Value Date   CREATININE 1.19 (H) 10/03/2022   BUN 21 10/03/2022   NA 136 10/03/2022   K 4.2 10/03/2022   CL 110 10/03/2022   CO2 19 (L) 10/03/2022   Lab Results  Component Value Date   ALT 20 10/01/2022   AST 43 (H) 10/01/2022   ALKPHOS 118 10/01/2022   BILITOT 0.5 10/01/2022   Lab Results  Component Value Date   CHOL 208 (H) 08/07/2022   HDL 52 08/07/2022   LDLCALC 135 (H) 08/07/2022   TRIG 120 08/07/2022   CHOLHDL 4.0 08/07/2022    Lab Results  Component Value Date   HGBA1C 5.9 (H) 09/19/2022    Assessment & Plan    1.  ***  No BP recorded.  {Refresh Note OR Click here to enter BP  :1}***   Lenna Sciara, NP 10/08/2022, 5:14 AM

## 2022-10-09 ENCOUNTER — Ambulatory Visit: Payer: 59

## 2022-10-10 ENCOUNTER — Other Ambulatory Visit: Payer: Self-pay | Admitting: Hematology

## 2022-10-10 ENCOUNTER — Other Ambulatory Visit: Payer: Self-pay

## 2022-10-10 ENCOUNTER — Encounter: Payer: Self-pay | Admitting: Nurse Practitioner

## 2022-10-10 ENCOUNTER — Inpatient Hospital Stay (HOSPITAL_BASED_OUTPATIENT_CLINIC_OR_DEPARTMENT_OTHER): Payer: 59 | Admitting: Nurse Practitioner

## 2022-10-10 ENCOUNTER — Inpatient Hospital Stay: Payer: 59

## 2022-10-10 ENCOUNTER — Inpatient Hospital Stay (HOSPITAL_BASED_OUTPATIENT_CLINIC_OR_DEPARTMENT_OTHER): Payer: 59 | Admitting: Hematology

## 2022-10-10 ENCOUNTER — Encounter: Payer: Self-pay | Admitting: Hematology

## 2022-10-10 VITALS — BP 127/80 | HR 99 | Temp 97.7°F | Resp 17

## 2022-10-10 DIAGNOSIS — Z170 Estrogen receptor positive status [ER+]: Secondary | ICD-10-CM | POA: Insufficient documentation

## 2022-10-10 DIAGNOSIS — C50512 Malignant neoplasm of lower-outer quadrant of left female breast: Secondary | ICD-10-CM

## 2022-10-10 DIAGNOSIS — G893 Neoplasm related pain (acute) (chronic): Secondary | ICD-10-CM | POA: Insufficient documentation

## 2022-10-10 DIAGNOSIS — Z5112 Encounter for antineoplastic immunotherapy: Secondary | ICD-10-CM | POA: Diagnosis not present

## 2022-10-10 DIAGNOSIS — Z5111 Encounter for antineoplastic chemotherapy: Secondary | ICD-10-CM | POA: Diagnosis not present

## 2022-10-10 DIAGNOSIS — C7951 Secondary malignant neoplasm of bone: Secondary | ICD-10-CM | POA: Insufficient documentation

## 2022-10-10 DIAGNOSIS — Z95828 Presence of other vascular implants and grafts: Secondary | ICD-10-CM

## 2022-10-10 DIAGNOSIS — R531 Weakness: Secondary | ICD-10-CM | POA: Diagnosis not present

## 2022-10-10 DIAGNOSIS — Z171 Estrogen receptor negative status [ER-]: Secondary | ICD-10-CM | POA: Diagnosis not present

## 2022-10-10 DIAGNOSIS — Z7189 Other specified counseling: Secondary | ICD-10-CM

## 2022-10-10 DIAGNOSIS — R590 Localized enlarged lymph nodes: Secondary | ICD-10-CM | POA: Insufficient documentation

## 2022-10-10 DIAGNOSIS — Z515 Encounter for palliative care: Secondary | ICD-10-CM

## 2022-10-10 DIAGNOSIS — K5900 Constipation, unspecified: Secondary | ICD-10-CM | POA: Diagnosis not present

## 2022-10-10 DIAGNOSIS — C7989 Secondary malignant neoplasm of other specified sites: Secondary | ICD-10-CM | POA: Insufficient documentation

## 2022-10-10 DIAGNOSIS — C50919 Malignant neoplasm of unspecified site of unspecified female breast: Secondary | ICD-10-CM

## 2022-10-10 DIAGNOSIS — G620 Drug-induced polyneuropathy: Secondary | ICD-10-CM | POA: Insufficient documentation

## 2022-10-10 DIAGNOSIS — M792 Neuralgia and neuritis, unspecified: Secondary | ICD-10-CM

## 2022-10-10 DIAGNOSIS — G63 Polyneuropathy in diseases classified elsewhere: Secondary | ICD-10-CM

## 2022-10-10 DIAGNOSIS — C801 Malignant (primary) neoplasm, unspecified: Secondary | ICD-10-CM

## 2022-10-10 LAB — CBC WITH DIFFERENTIAL (CANCER CENTER ONLY)
Abs Immature Granulocytes: 0.12 10*3/uL — ABNORMAL HIGH (ref 0.00–0.07)
Basophils Absolute: 0 10*3/uL (ref 0.0–0.1)
Basophils Relative: 0 %
Eosinophils Absolute: 0.3 10*3/uL (ref 0.0–0.5)
Eosinophils Relative: 3 %
HCT: 28.8 % — ABNORMAL LOW (ref 36.0–46.0)
Hemoglobin: 9.5 g/dL — ABNORMAL LOW (ref 12.0–15.0)
Immature Granulocytes: 1 %
Lymphocytes Relative: 7 %
Lymphs Abs: 0.8 10*3/uL (ref 0.7–4.0)
MCH: 30.7 pg (ref 26.0–34.0)
MCHC: 33 g/dL (ref 30.0–36.0)
MCV: 93.2 fL (ref 80.0–100.0)
Monocytes Absolute: 1 10*3/uL (ref 0.1–1.0)
Monocytes Relative: 8 %
Neutro Abs: 9.5 10*3/uL — ABNORMAL HIGH (ref 1.7–7.7)
Neutrophils Relative %: 81 %
Platelet Count: 289 10*3/uL (ref 150–400)
RBC: 3.09 MIL/uL — ABNORMAL LOW (ref 3.87–5.11)
RDW: 15.7 % — ABNORMAL HIGH (ref 11.5–15.5)
WBC Count: 11.7 10*3/uL — ABNORMAL HIGH (ref 4.0–10.5)
nRBC: 0 % (ref 0.0–0.2)

## 2022-10-10 LAB — CMP (CANCER CENTER ONLY)
ALT: 8 U/L (ref 0–44)
AST: 28 U/L (ref 15–41)
Albumin: 2.9 g/dL — ABNORMAL LOW (ref 3.5–5.0)
Alkaline Phosphatase: 90 U/L (ref 38–126)
Anion gap: 6 (ref 5–15)
BUN: 17 mg/dL (ref 8–23)
CO2: 24 mmol/L (ref 22–32)
Calcium: 8.1 mg/dL — ABNORMAL LOW (ref 8.9–10.3)
Chloride: 110 mmol/L (ref 98–111)
Creatinine: 0.99 mg/dL (ref 0.44–1.00)
GFR, Estimated: >60 mL/min (ref >60)
Glucose, Bld: 75 mg/dL (ref 70–99)
Potassium: 4.3 mmol/L (ref 3.5–5.1)
Sodium: 140 mmol/L (ref 135–145)
Total Bilirubin: 0.5 mg/dL (ref 0.3–1.2)
Total Protein: 5.5 g/dL — ABNORMAL LOW (ref 6.5–8.1)

## 2022-10-10 LAB — MAGNESIUM: Magnesium: 1.7 mg/dL (ref 1.7–2.4)

## 2022-10-10 MED ORDER — OXYCODONE HCL 10 MG PO TABS: 5.0000 mg | ORAL_TABLET | Freq: Four times a day (QID) | ORAL | 0 refills | Status: DC | PRN

## 2022-10-10 MED ORDER — HEPARIN SOD (PORK) LOCK FLUSH 100 UNIT/ML IV SOLN
500.0000 [IU] | Freq: Once | INTRAVENOUS | Status: AC | PRN
  Administered 2022-10-10: 500 [IU]

## 2022-10-10 MED ORDER — SODIUM CHLORIDE 0.9 % IV SOLN
235.2000 mg | Freq: Once | INTRAVENOUS | Status: AC
  Administered 2022-10-10: 250 mg via INTRAVENOUS
  Filled 2022-10-10: qty 25

## 2022-10-10 MED ORDER — SODIUM CHLORIDE 0.9 % IV SOLN
1000.0000 mg/m2 | Freq: Once | INTRAVENOUS | Status: AC
  Administered 2022-10-10: 2205 mg via INTRAVENOUS
  Filled 2022-10-10: qty 57.99

## 2022-10-10 MED ORDER — HYDROMORPHONE HCL 1 MG/ML IJ SOLN
0.5000 mg | Freq: Once | INTRAMUSCULAR | Status: AC
  Administered 2022-10-10: 0.5 mg via INTRAVENOUS
  Filled 2022-10-10: qty 1

## 2022-10-10 MED ORDER — PALONOSETRON HCL INJECTION 0.25 MG/5ML
0.2500 mg | Freq: Once | INTRAVENOUS | Status: AC
  Administered 2022-10-10: 0.25 mg via INTRAVENOUS
  Filled 2022-10-10: qty 5

## 2022-10-10 MED ORDER — SODIUM CHLORIDE 0.9 % IV SOLN: Freq: Once | INTRAVENOUS | Status: AC

## 2022-10-10 MED ORDER — SODIUM CHLORIDE 0.9% FLUSH
10.0000 mL | INTRAVENOUS | Status: DC | PRN
  Administered 2022-10-10: 10 mL

## 2022-10-10 MED ORDER — SODIUM CHLORIDE 0.9 % IV SOLN
200.0000 mg | Freq: Once | INTRAVENOUS | Status: AC
  Administered 2022-10-10: 200 mg via INTRAVENOUS
  Filled 2022-10-10: qty 8

## 2022-10-10 MED ORDER — XTAMPZA ER 9 MG PO C12A: 9.0000 mg | EXTENDED_RELEASE_CAPSULE | Freq: Two times a day (BID) | ORAL | 0 refills | Status: DC

## 2022-10-10 MED ORDER — SODIUM CHLORIDE 0.9% FLUSH
10.0000 mL | INTRAVENOUS | Status: DC | PRN
  Administered 2022-10-10: 10 mL via INTRAVENOUS

## 2022-10-10 MED ORDER — SODIUM CHLORIDE 0.9 % IV SOLN
10.0000 mg | Freq: Once | INTRAVENOUS | Status: AC
  Administered 2022-10-10: 10 mg via INTRAVENOUS
  Filled 2022-10-10: qty 10

## 2022-10-10 NOTE — Patient Instructions (Signed)
-   take 3 gabapentin in the morning, 3 in the afternoon, and 4 in the evening - continue your OxyContin every 12 hours - pick up and take duloxetine (Cymbalta) every day for the discomfort you are feeling, it helps with pain, nerves, and mood

## 2022-10-10 NOTE — Progress Notes (Signed)
[place]  [phone] [phone]   Name: Kristin Ward [facility] Date: 10/10/2022 MRN: [id number]  DOB: [birth year]  Patient Care Team: [name], MD as PCP - General (Family Medicine) [name], MD as PCP - Cardiology (Cardiology) [name], [name], MD as PCP - Advanced Heart Failure (Cardiology) [name], MD as Consulting Physician (Hematology) [name], MD as Consulting Physician (General Surgery) [name], [facility] (Inactive) as Pharmacist (Pharmacist) Jacelyn Pi, MD as Referring Physician (Endocrinology)    INTERVAL HISTORY: Kristin Ward is a 67 y.o. 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 Patient is s/p left breast lumpectomy, adjuvant chemo and Radiation. Biopsy of right gluteus on 09/02/20 due to new pain showed metastatic carcinoma, consistent with breast primary. Palliative ask to see for symptom and pain management and goals of care   SOCIAL HISTORY:     reports that she quit smoking about 7 years ago. Her smoking use included cigarettes. She has a 5.75 pack-year smoking history. She has never used smokeless tobacco. She reports that she does not drink alcohol and does not use drugs.  ADVANCE DIRECTIVES:  MOST and DNR filed  CODE STATUS: DNR  PAST MEDICAL HISTORY: Past Medical History:  Diagnosis Date   Abnormal x-ray of lungs with single pulmonary nodule 03/15/2016   per records from [place]    Anemia    Asthma    Cholelithiasis 05/19/2017   On CT   Coronary artery calcification seen on CAT scan 05/19/2017   Diabetes mellitus without complication ([place])    type 2   Diabetes mellitus, new onset ([place]) 01/20/2019   Dilated idiopathic cardiomyopathy ([place]) 12/2015   EF 15-20%. Diagnosed in [facility]   Headache    NONE RECENT    Hypertension    Lymph nodes enlarged 07/25/2020   Malignant neoplasm of lower-outer quadrant of left breast of female, estrogen receptor negative ([place]) 06/11/2017   left breast   Mixed hyperlipidemia 06/17/2018   partially blind    Personal history of chemotherapy 2018-2019   Personal history of radiation therapy 2019   Retinitis pigmentosa    Retroperitoneal lymphadenopathy 07/25/2020   Uveitis     ALLERGIES:  is allergic to morphine and related, latex, tylenol with codeine #3 [acetaminophen-codeine], and penicillins.  MEDICATIONS:  Current Outpatient Medications  Medication Sig Dispense Refill   Accu-Chek FastClix Lancets MISC TEST TWICE A DAY. PT USES AN ACCU-CHEK GUIDE ME METER 102 each 2   amLODipine (NORVASC) 5 MG tablet Take 5 mg by mouth daily as needed (HBP).     Ascorbic Acid (VITAMIN C) 500 MG CAPS Take 500 mg by mouth daily.     aspirin 81 MG chewable tablet Chew 81 mg by mouth daily.     atorvastatin (LIPITOR) 40 MG tablet Take 1 tablet (40 mg total) by mouth daily. 90 tablet 3   brimonidine (ALPHAGAN) 0.2 % ophthalmic solution Place 1 drop into both eyes 2 (two) times daily.     carvedilol (COREG) 12.5 MG tablet TAKE 1 AND 1/2 TABLETS BY MOUTH TWICE DAILY qm (Patient taking differently: Take 18.75 mg by mouth 2 (two) times daily with a meal.) 90 tablet 10   Cholecalciferol (VITAMIN D) 50 MCG (2000 UT) CAPS Take 2,000 Units by mouth daily.     empagliflozin (JARDIANCE) 10 MG TABS tablet Take 1 tablet (10 mg  total) by mouth daily before breakfast. 90 tablet 3   furosemide (LASIX) 20 MG tablet Take 40 mg by mouth daily as needed for fluid (weight gain of 3 -4 lbs).     gabapentin (NEURONTIN) 300 MG capsule Take 3 caps twice a day during the day and 4 caps at bedtime (Patient taking differently: Take 600-900 mg by mouth See admin instructions. Take 2 caps ( 600 mg)  twice a day during the day and 3 caps ( 900 mg)  at bedtime) 300 capsule 1   Lancets Misc. (ACCU-CHEK SOFTCLIX  LANCET DEV) KIT 1 Units by Does not apply route 2 (two) times daily. Check FSBS BID - Include strips # 50 with 5 refills, Lancets #50 with 5 refills DX: Type 2 DM - ICD 10: E11.9 1 kit 0   lidocaine-prilocaine (EMLA) cream Apply 1 application topically as needed. 30 g 0   linaclotide (LINZESS) 72 MCG capsule Take 1 capsule (72 mcg total) by mouth daily as needed (constipation). 30 capsule 2   magic mouthwash (nystatin, lidocaine, diphenhydrAMINE, alum & mag hydroxide) suspension Swish and swallow 5 mLs by mouth 4 (four) times daily as needed for mouth pain (Patient not taking: Reported on 10/02/2022) 140 mL 0   Magnesium 300 MG CAPS Take 300 mg by mouth 2 (two) times a week.     methocarbamol (ROBAXIN) 500 MG tablet Take 1 tablet (500 mg total) by mouth every 8 (eight) hours as needed for up to 14 days for muscle spasms. 42 tablet 0   Multiple Vitamins-Minerals (WOMENS MULTI PO) Take 15 mLs by mouth daily.     ondansetron (ZOFRAN) 8 MG tablet Take 1 tablet (8 mg total) by mouth every 8 (eight) hours as needed for nausea or vomiting (begin on day 3 after chemo). 30 tablet 1   pantoprazole (PROTONIX) 40 MG tablet TAKE 1 TABLET BY MOUTH EVERY DAY (Patient taking differently: Take 40 mg by mouth daily as needed (heartburn).) 90 tablet 1   polyethylene glycol (MIRALAX / GLYCOLAX) 17 g packet Take 17 g by mouth daily. (Patient not taking: Reported on 10/02/2022) 14 each 0   sacubitril-valsartan (ENTRESTO) 97-103 MG Take 1 tablet by mouth 2 (two) times daily. 60 tablet 10   senna-docusate (SENOKOT-S) 8.6-50 MG tablet Take 1 tablet by mouth 2 (two) times daily. 60 tablet 0   spironolactone (ALDACTONE) 25 MG tablet Take 1 tablet (25 mg total) by mouth daily. 30 tablet 10   vitamin B-12 (CYANOCOBALAMIN) 1000 MCG tablet Take 1,000 mcg by mouth daily.     zinc gluconate 50 MG tablet Take 50 mg by mouth daily.     No current facility-administered medications for this visit.    VITAL SIGNS: There were no vitals  taken for this visit. There were no vitals filed for this visit.  Estimated body mass index is 38.43 kg/m as calculated from the following:   Height as of 10/01/22: '5\' 7"'$  (1.702 m).   Weight as of 10/03/22: 245 lb 6 oz (111.3 kg).   PERFORMANCE STATUS (ECOG) : 1 - Symptomatic but completely ambulatory   Physical Exam General: NAD Cardiovascular: regular rate and rhythm Pulmonary:normal breathing  Abdomen: soft, nontender, + bowel sounds Extremities: significant right leg lymphedema, firm, upper right thigh Skin: right upper thigh open pressure wound (foul smelling, site cleansed and dressed), small dime size open area Neurological: AAO x3       IMPRESSION: I saw Kristin Ward during her infusion. She is resting comfortably in recliner.  Recent hospitalization due to pain and significant right leg edema. Alert and able to engage in discussions. Shares she has been trying to manage since recent return home. Appetite is good. Denies nausea, vomiting, constipation, or diarrhea.   Neoplasm related pain Kristin Ward continues to complain of ongoing back and leg pain.  States some days are better than others.  Rates her pain at worst 9 out of 10.  Pain does decrease to about 7-8 out of 10 with oxycodone IR.  We discussed at length patient's current pain regimen.  She is taking oxycodone 10 mg every 6 hours as needed.  Robaxin 500 mg every 8 hours as needed.  Gabapentin 600 mg twice daily and 900 mg at bedtime.  During recent hospitalization she was started on oxycodone 15 mg however this was unable to be approved at discharge.  Patient was then started on MS Contin and unfortunately was unable to tolerate due to nausea and vomiting and self discontinued.  She has not been taking any long-acting medication.  Education provided on the differences between long-acting and short acting medication.  Given her uncontrolled pain recommended restarting on oxycodone ER and utilizing oxycodone IR for  breakthrough pain as needed.  Patient verbalized understanding.  Instructed patient to continue with gabapentin as prescribed.  We will continue to closely monitor and adjust medications as needed.  If she continues to complain of ongoing pain despite current regimen could potentially consider Cymbalta for additional support.  Open wound (pressure) to right upper thigh/groin Kristin Ward has an open pressure wound to her right upper thigh.  Small dime sized area also in the right groin.  Education provided on the cause of open areas are due to her significant lymphedema.  Although area is hard to keep dry emphasized importance of cleansing area and patting dry frequently throughout the day.  Patient currently has Kleenex covering the area.  Advised to start using gauze and cleaning with mild soap and water.  Area is foul-smelling however this may be due to ongoing moisture.  Areas cleaned and redressed with saline and gauze.  Patient also given additional supplies for home support.  She is aware we will make a referral to the wound clinic for further evaluation and treatment.  Dr. Burr Medico also at the bedside to assess and offer support.  Goals of [place] continues to express wishes to continue to treat the treatable aggressively.  She does recognize that her health continues to be a challenge.  She wishes to continue to take life one day at a time.  I discussed the importance of continued conversation with family and their medical providers regarding overall plan of care and treatment options, ensuring decisions are within the context of the patients values and GOCs.  PLAN: Oxycodone ER 15 mg every 12 hours Oxycodone IR 10 mg every 6 hours as needed for breakthrough pain Gabapentin 600 mg twice daily, 900 mg at bedtime Continue with senna and MiraLAX for bowel regimen Supplies provided in addition to education on keeping right groin and thigh area clean and dry is much as possible.  Referral placed  to wound center for further evaluation and treatment. Ongoing symptom management and goals of care support. Will continue to closely monitor and adjust medications according to symptoms. I will plan to see patient back in 1-2 weeks in collaboration with her other oncology appointments.   Patient expressed understanding and was in agreement with this plan. She also understands that She can call the clinic at  any time with any questions, concerns, or complaints.    Any controlled substances utilized were prescribed in the context of palliative care. PDMP has been reviewed.    Time Total: 45 min.   Visit consisted of counseling and education dealing with the complex and emotionally intense issues of symptom management and palliative care in the setting of serious and potentially life-threatening illness.Greater than 50%  of this time was spent counseling and coordinating care related to the above assessment and plan.  Alda Lea, AGPCNP-BC  Palliative Medicine Team/[place]

## 2022-10-10 NOTE — Progress Notes (Signed)
[place]   [phone] [phone]   Clinic Follow up Note   Patient Care Team: [name], MD as PCP - General (Family Medicine) [name], MD as PCP - Cardiology (Cardiology) [name], [name], MD as PCP - Advanced Heart Failure (Cardiology) [name], MD as Consulting Physician (Hematology) [name], MD as Consulting Physician (General Surgery) [name], [facility] (Inactive) as Pharmacist (Pharmacist) Jacelyn Pi, MD as Referring Physician (Endocrinology)  Date of Service:  10/10/2022  CHIEF COMPLAINT: f/u of metastatic breast cancer    CURRENT THERAPY:  Carbolpatin,+Gemcitabine q21d , Pembrolizumab q21    ASSESSMENT:  Kristin Ward is a 67 y.o. female with   Malignant neoplasm of lower-outer quadrant of left breast of female, estrogen receptor negative (Kristin Ward) stage IB, p(T1cN0M0), Triple negative, Grade 3, in 2018, metastatic disease in 08/2020 ER 20% weakly + PR/HER2 (0) negative, PD-L1 0%, genetics (-), HER2 1+ on node biopsy  -diagnosed with triple negative breast cancer in 05/2017. S/p left breast lumpectomy, adjuvant chemo TC and Radiation.  -biopsy of right gluteus on 09/02/20 due to new pain showed metastatic carcinoma, consistent with breast primary. ER 20% weakly positive, PR and HER2 negative.  -s/p multiple line chemo, including taxol, xeloda, Trodelvy, enhertu -she was on [place], but developed worsening neuropathy and stopped after 07/05/22 and she requested a chemo break  -she started carboplatin and gemcitabine, plus Keytruda on 09/11/2022, chemo has been held since first treatment due to her multiple hospital admissions. -Lab reviewed, adequate for treatment, will proceed cycle 2-day 1 chemo today.   Malignant neoplasm metastatic to bone Kristin Ward) She developed radiating right hip pain in 2020 from right iliac wing mass. S/p palliative radiation to right iliac 09/04/20 - 09/22/20 with Dr. Lisbeth Renshaw. -Given  localized bone met, she started Zometa q47month on 11/20/20   Cancer related pain -received multiple palliative RT -on oxycodone and garbapentin -f/u with palliative care clinic    Peripheral neuropathy due to chemotherapy ([facility] -S/p C2D8 Taxol she developed mild tingling in her hands and feet. Taxol stopped after 12/18/20.  -worsening with chemo    Severe right lower extremity lymphedema and right inguinal wound -Probably secondary to cancer progression -Patient has severe lymphedema and pain, status post bilateral iliac vein stent placement without significant improvement, I have reviewed her case with Dr. MLisbeth Renshaw he does not feel palliative radiation would be beneficial. -She has been seen by physical therapy, due to significant right inguinal wound, will hold on PT for now. -Palliative care nurse practitioner NLexine Batonhas referred her to wound care today.     PLAN: -Palliative called in Oxycodone/referral to the wound clinic -labs are adequate for treatment. -proceed with C2 Carbolpatin,+Gemcitabine Pembrolizumab today -f/u next week before chemo      SUMMARY OF ONCOLOGIC HISTORY: Oncology History Overview Note  Cancer Staging Malignant neoplasm of lower-outer quadrant of left breast of female, estrogen receptor negative ([place] Staging form: Breast, AJCC 8th Edition - Clinical stage from 06/06/2017: Stage IB (cT1c, cN0, cM0, G3, ER-, PR-, HER2-) - Signed by FTruitt Merle MD on 06/15/2017 Nuclear grade: G3 Histologic grading system: 3 grade system - Pathologic stage from 06/26/2017: Stage IB (pT1c, pN0, cM0, G3, ER-, PR-, HER2-) - Signed by FTruitt Merle MD on 07/10/2017 Neoadjuvant therapy: No Nuclear grade: G3 Multigene prognostic tests performed: None Histologic grading system: 3 grade system Laterality: Left     Malignant neoplasm of lower-outer quadrant of left breast of female, estrogen receptor negative ([place]  05/30/2017 Mammogram   Diagnostic mammo and US IMPRESSION: 1.  Highly suspicious 1.4 cm mass in the slightly lower slightly outer left breast -tissue sampling recommended. 2. Indeterminate 0.5 mm mass in the slightly lower slightly outer left breast -tissue sampling recommended. 3. At least 2 left axillary lymph nodes with borderline cortical thickness.   06/06/2017 Initial Biopsy   Diagnosis 1. Breast, left, needle core biopsy, 5:30 o'clock - INVASIVE DUCTAL CARCINOMA, G3 2. Lymph node, needle/core biopsy, left axillary - NO CARCINOMA IDENTIFIED IN ONE LYMPH NODE (0/1)   06/06/2017 Initial Diagnosis   Malignant neoplasm of lower-outer quadrant of left breast of female, estrogen receptor negative ([place])   06/06/2017 Receptors her2   Estrogen Receptor: 0%, NEGATIVE Progesterone Receptor: 0%, NEGATIVE Proliferation Marker Ki67: 70%   06/26/2017 Surgery   LEFT BREAST LUMPECTOMY WITH RADIOACTIVE SEED AND SENTINEL LYMPH NODE BIOPSY ERAS  PATHWAY AND INSERTION PORT-A-CATH By Dr. Barry Dienes on 06/26/17    06/26/2017 Pathology Results   Diagnosis 06/26/17  1. Breast, lumpectomy, Left - INVASIVE DUCTAL CARCINOMA, GRADE III/III, SPANNING 1.2 CM. - THE SURGICAL RESECTION MARGINS ARE NEGATIVE FOR CARCINOMA. - SEE ONCOLOGY TABLE BELOW. 2. Lymph node, sentinel, biopsy, Left axillary #1 - THERE IS NO EVIDENCE OF CARCINOMA IN 1 OF 1 LYMPH NODE (0/1). 3. Lymph node, sentinel, biopsy, Left axillary #2 - THERE IS NO EVIDENCE OF CARCINOMA IN 1 OF 1 LYMPH NODE (0/1). 4. Lymph node, sentinel, biopsy, Left axillary #3 - THERE IS NO EVIDENCE OF CARCINOMA IN 1 OF 1 LYMPH NODE (0/1).    07/25/2017 - 09/29/2017 Chemotherapy   Adjuvant cytoxan and docetaxel (TC) every 3 weekd for 4 cycles     11/19/2017 - 12/17/2017 Radiation Therapy   Adjuvant breast radiation Left breast treated to 42.5 Gy with 17 fx of 2.5 Gy followed by a boost of 7.5 Gy with 3 fx of 2.5 Gy      02/2019 Procedure   She had PAC removal in 02/2019.    07/23/2020 Imaging   MRI Lumbar Spine  07/23/20 IMPRESSION: 1. No significant disc herniation, spinal canal or neural foraminal stenosis at any level. 2. Moderate facet degenerative changes at L3-4, L4-5 and L5-S1. 3. Multiple enlarged retroperitoneal and right iliac lymph nodes. Recommend correlation with CT of the abdomen and pelvis with contrast.   08/15/2020 Imaging   CT AP 08/15/20  IMPRESSION: 1. Right iliac and periaortic adenopathy is noted concerning for metastatic disease or lymphoma. Also noted is abnormal soft tissue mass anterior and posterior to the right iliac wing concerning for malignancy or metastatic disease. MRI is recommended for further evaluation. 2. Irregular lucency with sclerotic margins is seen involving the right iliac crest concerning for possible lytic lesion. MRI may be performed for further evaluation. 3. Enlarged fibroid uterus. 4. Small gallstone. 5. Aortic atherosclerosis.   08/22/2020 Pathology Results   FINAL MICROSCOPIC DIAGNOSIS: 08/22/20  A. NEEDLE CORE, RIGHT GLUTEUS MINIMUMUS, BIOPSY:  -  Metastatic carcinoma  -  See comment   COMMENT:  By immunohistochemistry, the neoplastic cells are positive for  cytokeratin-7 and have weak patchy positivity for GATA3 but are negative for cytokeratin-20 and GCDFP.  The combined morphology and immunophenotype, support metastasis from the patient's known breast primary carcinoma.   ADDENDUM:  PROGNOSTIC INDICATOR RESULTS:  The tumor cells are NEGATIVE for Her2 (0).  Estrogen Receptor:       POSITIVE, 20%, WEAK STAINING  Progesterone Receptor:   NEGATIVE   PD-L1 (0) negative    09/04/2020 - 09/22/2020  Radiation Therapy   Palliative radiation to right iliac 09/04/20 - 09/22/20, Dr. Lisbeth Renshaw   09/26/2020 -  Chemotherapy   first line weekly Taxol 3 weeks on/1 week off starting 09/26/20 --Reduced to 2 weeks on/1 week off starting with C3 due to neuropathy. Stopped after C4 on 12/18/20 due to neuratrophy.  --Switched to Xeloda on 01/01/21 at '1500mg'$  in  the AM and '2000mg'$  in the PM for 2 weeks on/1 week off     10/13/2020 Procedure    PAC placement on 10/13/20.    04/10/2021 Imaging   CT CAP  IMPRESSION: 1. Enlarging abdominopelvic retroperitoneal and right inguinal adenopathy, compatible with disease progression. 2. Lytic metastasis in the right iliac wing, similar. 3. Hepatomegaly. 4. Cholelithiasis. 5. Enlarged fibroid uterus. 6.  Aortic atherosclerosis (ICD10-I70.0).   06/18/2021 Imaging   CT CAP  IMPRESSION: 1. Today's study demonstrates progression of metastatic disease as evidence by increased number and size of numerous enlarged lymph nodes in the retroperitoneum, along the right pelvic sidewall, and in the upper right thigh. 2. Osseous metastases in the bony pelvis appear similar to the prior examination. No definite new osseous lesions are otherwise noted. 3. Fibroid uterus again noted. 4. Cholelithiasis without evidence of acute cholecystitis. 5. Aortic atherosclerosis, in addition to left anterior descending coronary artery disease. Please note that although the presence of coronary artery calcium documents the presence of coronary artery disease, the severity of this disease and any potential stenosis cannot be assessed on this non-gated CT examination. Assessment for potential risk factor modification, dietary therapy or pharmacologic therapy may be warranted, if clinically indicated. 6. Additional incidental findings, as above.   06/20/2021 Imaging   Bone Scan  IMPRESSION: No osseous uptake suspicious for recurrent/progressive metastatic disease.   07/03/2021 - 01/11/2022 Chemotherapy   Patient is on Treatment Plan : BREAST METASTATIC Sacituzumab govitecan-hziy [name]) q21d     09/27/2021 Imaging   EXAM: CT CHEST, ABDOMEN, AND PELVIS WITH CONTRAST  IMPRESSION: 1. Marked interval improvement in previously demonstrated lymphadenopathy in the abdomen and pelvis. A few prominent right inguinal lymph nodes  remain. 2. Stable osseous metastatic disease in the pelvis. 3. No evidence of local recurrence or other metastatic disease. 4. Stable incidental findings including cholelithiasis, uterine fibroids and Aortic Atherosclerosis (ICD10-I70.0).  ADDENDUM: Not mentioned in the original impression is a possible nonocclusive fibrin sheath along the distal aspect of the Port-A-Cath.   09/27/2021 Imaging   EXAM: NUCLEAR MEDICINE WHOLE BODY BONE SCAN  IMPRESSION: Stable examination, without abnormal osseous uptake.   11/27/2021 Imaging   CT AP IMPRESSION: 1. Stable lytic and sclerotic lesion in the RIGHT iliac wing. 2. No evidence of visceral metastasis or adenopathy in the abdomenpelvis. 3. Leiomyomatous uterus.  ADDENDUM REPORT: 11/29/2021 09:33 Upon further review and discussion with Cira Rue NP, the RIGHT inguinal lymph nodes previous described have increased in volume. For example 14 mm node (image 91/2) is increased from 9 mm. Adjacent 14 mm node on image 86 is increased from 8 mm. Additionally, LEFT common iliac lymph node is also slightly increased measuring 10 mm (46/2) compared to 7 mm on comparison exam from 09/27/2021. These sites demonstrates similar but greater lymphadenopathy on CT 06/18/2021.   02/15/2022 - 03/08/2022 Chemotherapy   Patient is on Treatment Plan : BREAST METASTATIC fam-trastuzumab deruxtecan-nxki (Enhertu) q21d     02/15/2022 - 05/10/2022 Chemotherapy   Patient is on Treatment Plan : BREAST METASTATIC Fam-Trastuzumab Deruxtecan-nxki (Enhertu) (5.4) q21d     06/07/2022 - 07/05/2022 Chemotherapy  Patient is on Treatment Plan : BREAST METASTATIC Eribulin D1,8 q21d     07/26/2022 - 07/26/2022 Chemotherapy   Patient is on Treatment Plan : HEAD/NECK Pembrolizumab (200) q21d     09/11/2022 - 09/11/2022 Chemotherapy   Patient is on Treatment Plan : HEAD/NECK Pembrolizumab (200) q21d     09/11/2022 - 09/11/2022 Chemotherapy   Patient is on Treatment Plan : BREAST  Carboplatin + Gemcitabine D1,8 q21d     09/19/2022 -  Chemotherapy   Patient is on Treatment Plan : BREAST Pembrolizumab (200) D1 + Carboplatin (2) D1,8 + Gemcitabine (1000) D1,8 q21d     Neuropathy associated with cancer ([place])  11/21/2021 Initial Diagnosis   Neuropathy associated with cancer ([place])      INTERVAL HISTORY:  Kristin Ward is here for a follow up of metastatic breast cancer   She was last seen by me on 09/18/2022 She presents to the clinic alone. Pt denied having fever . Pt state pain in the right leg. Pt able to walk with a walker. Pt states that her daughter and son takes care of her.       All other systems were reviewed with the patient and are negative.  MEDICAL HISTORY:  Past Medical History:  Diagnosis Date   Abnormal x-ray of lungs with single pulmonary nodule 03/15/2016   per records from [place]    Anemia    Asthma    Cholelithiasis 05/19/2017   On CT   Coronary artery calcification seen on CAT scan 05/19/2017   Diabetes mellitus without complication ([place])    type 2   Diabetes mellitus, new onset ([place]) 01/20/2019   Dilated idiopathic cardiomyopathy ([place]) 12/2015   EF 15-20%. Diagnosed in [facility]   Headache    NONE RECENT   Hypertension    Lymph nodes enlarged 07/25/2020   Malignant neoplasm of lower-outer quadrant of left breast of female, estrogen receptor negative ([place]) 06/11/2017   left breast   Mixed hyperlipidemia 06/17/2018   partially blind    Personal history of chemotherapy 2018-2019   Personal history of radiation therapy 2019   Retinitis pigmentosa    Retroperitoneal lymphadenopathy 07/25/2020   Uveitis     SURGICAL HISTORY: Past Surgical History:  Procedure Laterality Date   brain cyst removed  2007   to help with headaches per patient , aspirated    BREAST BIOPSY Left 06/06/2017   x2   BREAST CYST ASPIRATION Left 06/06/2017   BREAST LUMPECTOMY Left 06/26/2017   BREAST LUMPECTOMY WITH RADIOACTIVE SEED  AND SENTINEL LYMPH NODE BIOPSY Left 06/26/2017   Procedure: BREAST LUMPECTOMY WITH RADIOACTIVE SEED AND SENTINEL LYMPH NODE BIOPSY ERAS  PATHWAY;  Surgeon: [name], MD;  Location: [place];  Service: General;  Laterality: Left;  pec block   FINE NEEDLE ASPIRATION Left 02/23/2019   Procedure: FINE NEEDLE ASPIRATION;  Surgeon: [name], MD;  Location: [place];  Service: General;  Laterality: Left;  Asiration of left breast seroma    IR IMAGING GUIDED PORT INSERTION  10/13/2020   IR IVUS EACH ADDITIONAL NON CORONARY VESSEL  09/24/2022   IR IVUS EACH ADDITIONAL NON CORONARY VESSEL  09/24/2022   IR TRANSCATH PLC STENT  EA ADD VEIN  Ward ANGIOPLASTY  09/25/2022   IR TRANSCATH PLC STENT  INITIAL VEIN  Ward ANGIOPLASTY  09/24/2022   IR US GUIDE VASC ACCESS RIGHT  09/24/2022   IR VENO/EXT/BI  09/23/2022   IR VENO/EXT/UNI LEFT  09/24/2022   IR VENO/EXT/UNI RIGHT  09/24/2022   IR VENOCAVAGRAM IVC  09/25/2022   NASAL TURBINATE REDUCTION     PORT-A-CATH REMOVAL Right 02/23/2019   Procedure: REMOVAL PORT-A-CATH;  Surgeon: [name], MD;  Location: [facility];  Service: General;  Laterality: Right;   PORTACATH PLACEMENT N/A 06/26/2017   Procedure: INSERTION PORT-A-CATH;  Surgeon: [name], MD;  Location: [place];  Service: General;  Laterality: N/A;   RADIOLOGY WITH ANESTHESIA Right 09/24/2022   Procedure: ARTERIOGRAM RIGHT LEG;  Surgeon: Radiologist, Medication, MD;  Location: [facility];  Service: Radiology;  Laterality: Right;   RIGHT/LEFT HEART CATH AND CORONARY ANGIOGRAPHY N/A 07/31/2022   Procedure: RIGHT/LEFT HEART CATH AND CORONARY ANGIOGRAPHY;  Surgeon: Jolaine Artist, MD;  Location: [place] [facility];  Service: Cardiovascular;  Laterality: N/A;   TRANSTHORACIC ECHOCARDIOGRAM  12/2015   A) [facility] May 2017: Mild concentric LVH. Global hypokinesis. GR daily. EF 18% severe LA dilation. Mitral annular dilatation with papillary muscle dysfunction and  moderate MR. Dilated IVC consistent with elevated RAP.   TRANSTHORACIC ECHOCARDIOGRAM  05/2017; 08/2017   a) Mild concentric LVH.  EF 45 to 50% with diffuse hypokinesis.  GR 1 DD.  Mild aortic root dilation.;; b)  Mild LVH EF 45 to 50%.  Diffuse HK.  No significant valve disease.  No significant change.   TRANSTHORACIC ECHOCARDIOGRAM  01/30/2022   Pre-Enhertu:EF 35 to 40%.  Moderate dysfunction with global HK.  Abnormal strain.  Normal RV size and function.  Normal RVSP.  Aortic root estimated 42 mm.  Mild MR.  Mild/trivial AI.  Normal RVP.;  04/01/2022: Preliminary read 40%.  Formal read 45 to 50% (probably 40 to 45%).  Global HK.  GR 1 DD.  Normal RV size and RVP.  Mild MR.  Now measured at 38 mm.  Normal RAP.    I have reviewed the social history and family history with the patient and they are unchanged from previous note.  ALLERGIES:  is allergic to morphine and related, latex, tylenol with codeine #3 [acetaminophen-codeine], and penicillins.  MEDICATIONS:  Current Outpatient Medications  Medication Sig Dispense Refill   Accu-Chek FastClix Lancets MISC TEST TWICE A DAY. PT USES AN ACCU-CHEK GUIDE ME METER 102 each 2   amLODipine (NORVASC) 5 MG tablet Take 5 mg by mouth daily as needed (HBP).     Ascorbic Acid (VITAMIN C) 500 MG CAPS Take 500 mg by mouth daily.     aspirin 81 MG chewable tablet Chew 81 mg by mouth daily.     atorvastatin (LIPITOR) 40 MG tablet Take 1 tablet (40 mg total) by mouth daily. 90 tablet 3   brimonidine (ALPHAGAN) 0.2 % ophthalmic solution Place 1 drop into both eyes 2 (two) times daily.     carvedilol (COREG) 12.5 MG tablet TAKE 1 AND 1/2 TABLETS BY MOUTH TWICE DAILY qm (Patient taking differently: Take 18.75 mg by mouth 2 (two) times daily with a meal.) 90 tablet 10   Cholecalciferol (VITAMIN D) 50 MCG (2000 UT) CAPS Take 2,000 Units by mouth daily.     empagliflozin (JARDIANCE) 10 MG TABS tablet Take 1 tablet (10 mg total) by mouth daily before breakfast. 90  tablet 3   furosemide (LASIX) 20 MG tablet Take 40 mg by mouth daily as needed for fluid (weight gain of 3 -4 lbs).     gabapentin (NEURONTIN) 300 MG capsule Take 3 caps twice a day during the day and 4 caps at bedtime (Patient taking differently: Take 600-900 mg by mouth  See admin instructions. Take 2 caps ( 600 mg)  twice a day during the day and 3 caps ( 900 mg)  at bedtime) 300 capsule 1   Lancets Misc. (ACCU-CHEK SOFTCLIX LANCET DEV) KIT 1 Units by Does not apply route 2 (two) times daily. Check FSBS BID - Include strips # 50 with 5 refills, Lancets #50 with 5 refills DX: Type 2 DM - ICD 10: E11.9 1 kit 0   lidocaine-prilocaine (EMLA) cream Apply 1 application topically as needed. 30 g 0   linaclotide (LINZESS) 72 MCG capsule Take 1 capsule (72 mcg total) by mouth daily as needed (constipation). 30 capsule 2   Magnesium 300 MG CAPS Take 300 mg by mouth 2 (two) times a week.     methocarbamol (ROBAXIN) 500 MG tablet Take 1 tablet (500 mg total) by mouth every 8 (eight) hours as needed for up to 14 days for muscle spasms. 42 tablet 0   Multiple Vitamins-Minerals (WOMENS MULTI PO) Take 15 mLs by mouth daily.     ondansetron (ZOFRAN) 8 MG tablet Take 1 tablet (8 mg total) by mouth every 8 (eight) hours as needed for nausea or vomiting (begin on day 3 after chemo). 30 tablet 1   oxyCODONE 10 MG TABS Take 0.5 tablets (5 mg total) by mouth every 6 (six) hours as needed for severe pain. 60 tablet 0   oxyCODONE ER (XTAMPZA ER) 9 MG C12A Take 9 mg by mouth every 12 (twelve) hours. 15 capsule 0   pantoprazole (PROTONIX) 40 MG tablet TAKE 1 TABLET BY MOUTH EVERY DAY (Patient taking differently: Take 40 mg by mouth daily as needed (heartburn).) 90 tablet 1   polyethylene glycol (MIRALAX / GLYCOLAX) 17 g packet Take 17 g by mouth daily. (Patient not taking: Reported on 10/02/2022) 14 each 0   sacubitril-valsartan (ENTRESTO) 97-103 MG Take 1 tablet by mouth 2 (two) times daily. 60 tablet 10   senna-docusate  (SENOKOT-S) 8.6-50 MG tablet Take 1 tablet by mouth 2 (two) times daily. 60 tablet 0   spironolactone (ALDACTONE) 25 MG tablet Take 1 tablet (25 mg total) by mouth daily. 30 tablet 10   vitamin B-12 (CYANOCOBALAMIN) 1000 MCG tablet Take 1,000 mcg by mouth daily.     zinc gluconate 50 MG tablet Take 50 mg by mouth daily.     No current facility-administered medications for this visit.   Facility-Administered Medications Ordered in Other Visits  Medication Dose Route Frequency Provider Last Rate Last Admin   CARBOplatin (PARAPLATIN) 250 mg in sodium chloride 0.9 % 100 mL chemo infusion  250 mg Intravenous Once [name], MD 250 mL/hr at 10/10/22 1627 250 mg at 10/10/22 1627   heparin lock flush 100 unit/mL  500 Units Intracatheter Once PRN [name], MD       sodium chloride flush (NS) 0.9 % injection 10 mL  10 mL Intracatheter PRN [name], MD        PHYSICAL EXAMINATION: ECOG PERFORMANCE STATUS: 3 - Symptomatic, >50% confined to bed  There were no vitals filed for this visit. Wt Readings from Last 3 Encounters:  10/03/22 245 lb 6 oz (111.3 kg)  09/24/22 223 lb (101.2 kg)  09/11/22 225 lb 4 oz (102.2 kg)     GENERAL:alert, no distress and comfortable SKIN: skin color normal, no rashes or significant lesions EYES: normal, Conjunctiva are pink and non-injected, sclera clear  NEURO: alert & oriented x 3 with fluent speech  LABORATORY DATA:  I have reviewed the data  as listed    Latest Ref Rng & Units 10/10/2022   11:54 AM 10/03/2022    5:51 AM 10/02/2022    6:11 AM  CBC  WBC 4.0 - 10.5 K/uL 11.7  7.8  7.0   Hemoglobin 12.0 - 15.0 g/dL 9.5  9.5  8.9   Hematocrit 36.0 - 46.0 % 28.8  29.9  29.4   Platelets 150 - 400 K/uL 289  400  387         Latest Ref Rng & Units 10/10/2022   11:54 AM 10/03/2022    5:51 AM 10/02/2022    6:11 AM  CMP  Glucose 70 - 99 mg/dL 75  86  88   BUN 8 - 23 mg/dL '17  21  28   '$ Creatinine 0.44 - 1.00 mg/dL 0.99  1.19  1.33   Sodium 135 - 145 mmol/L 140   136  136   Potassium 3.5 - 5.1 mmol/L 4.3  4.2  4.4   Chloride 98 - 111 mmol/L 110  110  111   CO2 22 - 32 mmol/L '24  19  16   '$ Calcium 8.9 - 10.3 mg/dL 8.1  8.3  7.9   Total Protein 6.5 - 8.1 g/dL 5.5     Total Bilirubin 0.3 - 1.2 mg/dL 0.5     Alkaline Phos 38 - 126 U/L 90     AST 15 - 41 U/L 28     ALT 0 - 44 U/L 8         RADIOGRAPHIC STUDIES: I have personally reviewed the radiological images as listed and agreed with the findings in the report. No results found.    No orders of the defined types were placed in this encounter.  All questions were answered. The patient knows to call the clinic with any problems, questions or concerns. No barriers to learning was detected. The total time spent in the appointment was 30 minutes.     [name], MD 10/10/2022   Felicity Coyer, CMA, am acting as scribe for [name], MD.   I have reviewed the above documentation for accuracy and completeness, and I agree with the above.

## 2022-10-10 NOTE — Patient Instructions (Signed)
[facility]  Discharge Instructions: Thank you for choosing [facility] to provide your oncology and hematology care.   If you have a lab appointment with the [facility], please go directly to the [facility] and check in at the registration area.   Wear comfortable clothing and clothing appropriate for easy access to any Portacath or PICC line.   We strive to give you quality time with your provider. You may need to reschedule your appointment if you arrive late (15 or more minutes).  Arriving late affects you and other patients whose appointments are after yours.  Also, if you miss three or more appointments without notifying the office, you may be dismissed from the clinic at the provider's discretion.      For prescription refill requests, have your pharmacy contact our office and allow 72 hours for refills to be completed.    Today you received the following chemotherapy and/or immunotherapy agents: Keytruda, Gemzar, and Carboplatin      To help prevent nausea and vomiting after your treatment, we encourage you to take your nausea medication as directed.  BELOW ARE SYMPTOMS THAT SHOULD BE REPORTED IMMEDIATELY: *FEVER GREATER THAN 100.4 F (38 C) OR HIGHER *CHILLS OR SWEATING *NAUSEA AND VOMITING THAT IS NOT CONTROLLED WITH YOUR NAUSEA MEDICATION *UNUSUAL SHORTNESS OF BREATH *UNUSUAL BRUISING OR BLEEDING *URINARY PROBLEMS (pain or burning when urinating, or frequent urination) *BOWEL PROBLEMS (unusual diarrhea, constipation, pain near the anus) TENDERNESS IN MOUTH AND THROAT WITH OR WITHOUT PRESENCE OF ULCERS (sore throat, sores in mouth, or a toothache) UNUSUAL RASH, SWELLING OR PAIN  UNUSUAL VAGINAL DISCHARGE OR ITCHING   Items with * indicate a potential emergency and should be followed up as soon as possible or go to the Emergency Department if any problems should occur.  Please show the CHEMOTHERAPY ALERT CARD or  IMMUNOTHERAPY ALERT CARD at check-in to the Emergency Department and triage nurse.  Should you have questions after your visit or need to cancel or reschedule your appointment, please contact [facility]  Dept: [phone]  and follow the prompts.  Office hours are 8:00 a.m. to 4:30 p.m. Monday - Friday. Please note that voicemails left after 4:00 p.m. may not be returned until the following business day.  We are closed weekends and major holidays. You have access to a nurse at all times for urgent questions. Please call the main number to the clinic Dept: [phone] and follow the prompts.   For any non-urgent questions, you may also contact your provider using MyChart. We now offer e-Visits for anyone 68 and older to request care online for non-urgent symptoms. For details visit [email or web address].   Also download the MyChart app! Go to the app store, search "MyChart", open the app, select [facility], and log in with your MyChart username and password.

## 2022-10-11 ENCOUNTER — Encounter: Payer: 59 | Admitting: Rehabilitation

## 2022-10-14 ENCOUNTER — Other Ambulatory Visit: Payer: Self-pay

## 2022-10-14 ENCOUNTER — Ambulatory Visit: Payer: 59 | Attending: Nurse Practitioner

## 2022-10-14 DIAGNOSIS — C801 Malignant (primary) neoplasm, unspecified: Secondary | ICD-10-CM

## 2022-10-14 DIAGNOSIS — I890 Lymphedema, not elsewhere classified: Secondary | ICD-10-CM | POA: Insufficient documentation

## 2022-10-14 DIAGNOSIS — R293 Abnormal posture: Secondary | ICD-10-CM | POA: Insufficient documentation

## 2022-10-14 DIAGNOSIS — M6281 Muscle weakness (generalized): Secondary | ICD-10-CM | POA: Insufficient documentation

## 2022-10-14 DIAGNOSIS — C7951 Secondary malignant neoplasm of bone: Secondary | ICD-10-CM

## 2022-10-14 DIAGNOSIS — R262 Difficulty in walking, not elsewhere classified: Secondary | ICD-10-CM | POA: Insufficient documentation

## 2022-10-14 DIAGNOSIS — G620 Drug-induced polyneuropathy: Secondary | ICD-10-CM | POA: Insufficient documentation

## 2022-10-14 DIAGNOSIS — T451X5A Adverse effect of antineoplastic and immunosuppressive drugs, initial encounter: Secondary | ICD-10-CM | POA: Insufficient documentation

## 2022-10-14 DIAGNOSIS — Z171 Estrogen receptor negative status [ER-]: Secondary | ICD-10-CM

## 2022-10-14 NOTE — Progress Notes (Signed)
Per order given by Dr. Burr Medico based on staff message conversation between Dr. Burr Medico and Physical Therapy, referral placed for home health PT & OT with [facility]  Referral orders, pt demographics, office note, and medication list Epic faxed to [facility].  Fax confirmation received.  Pt's pain management will continue to be managed by Jobe Gibbon, NP in Palliative Care.

## 2022-10-15 ENCOUNTER — Encounter: Payer: Self-pay | Admitting: Nurse Practitioner

## 2022-10-15 ENCOUNTER — Telehealth: Payer: Self-pay

## 2022-10-15 ENCOUNTER — Inpatient Hospital Stay: Payer: 59 | Attending: Hematology | Admitting: Nurse Practitioner

## 2022-10-15 ENCOUNTER — Other Ambulatory Visit: Payer: 59

## 2022-10-15 DIAGNOSIS — C7951 Secondary malignant neoplasm of bone: Secondary | ICD-10-CM | POA: Diagnosis not present

## 2022-10-15 DIAGNOSIS — Z515 Encounter for palliative care: Secondary | ICD-10-CM

## 2022-10-15 DIAGNOSIS — Z171 Estrogen receptor negative status [ER-]: Secondary | ICD-10-CM | POA: Diagnosis not present

## 2022-10-15 DIAGNOSIS — G893 Neoplasm related pain (acute) (chronic): Secondary | ICD-10-CM | POA: Insufficient documentation

## 2022-10-15 DIAGNOSIS — K5900 Constipation, unspecified: Secondary | ICD-10-CM | POA: Diagnosis not present

## 2022-10-15 DIAGNOSIS — G629 Polyneuropathy, unspecified: Secondary | ICD-10-CM | POA: Insufficient documentation

## 2022-10-15 DIAGNOSIS — Z7982 Long term (current) use of aspirin: Secondary | ICD-10-CM | POA: Insufficient documentation

## 2022-10-15 DIAGNOSIS — E119 Type 2 diabetes mellitus without complications: Secondary | ICD-10-CM | POA: Insufficient documentation

## 2022-10-15 DIAGNOSIS — R530 Neoplastic (malignant) related fatigue: Secondary | ICD-10-CM | POA: Diagnosis not present

## 2022-10-15 DIAGNOSIS — C50512 Malignant neoplasm of lower-outer quadrant of left female breast: Secondary | ICD-10-CM

## 2022-10-15 DIAGNOSIS — Z79899 Other long term (current) drug therapy: Secondary | ICD-10-CM | POA: Insufficient documentation

## 2022-10-15 DIAGNOSIS — Z7189 Other specified counseling: Secondary | ICD-10-CM | POA: Diagnosis not present

## 2022-10-15 DIAGNOSIS — C7989 Secondary malignant neoplasm of other specified sites: Secondary | ICD-10-CM | POA: Insufficient documentation

## 2022-10-15 DIAGNOSIS — Z9221 Personal history of antineoplastic chemotherapy: Secondary | ICD-10-CM | POA: Insufficient documentation

## 2022-10-15 DIAGNOSIS — Z923 Personal history of irradiation: Secondary | ICD-10-CM | POA: Insufficient documentation

## 2022-10-15 DIAGNOSIS — Z7984 Long term (current) use of oral hypoglycemic drugs: Secondary | ICD-10-CM | POA: Insufficient documentation

## 2022-10-15 LAB — CANCER ANTIGEN 27.29: CA 27.29: 800.3 U/mL — ABNORMAL HIGH (ref 0.0–38.6)

## 2022-10-15 NOTE — Telephone Encounter (Signed)
Pt called asking were Dr. Ernestina Penna office going to be taking care of her wound in her inner thigh.  Pt also inquired about when her next appt with Dr. Burr Medico was scheduled.  Informed pt that she's currently scheduled on 10/24/2022.  Pt wants to know who's going to be taking care of her wound.  Informed pt that this RN will check with Dr. Burr Medico and Alda Lea, NP in [place] regarding the pt's wound management.  Informed pt that this RN will give her a call regarding wound.  Pt verbalized understanding.

## 2022-10-15 NOTE — Progress Notes (Signed)
COMMUNITY PALLIATIVE CARE SW NOTE  PATIENT NAME: Kristin Ward DOB: 1956/06/21 MRN: [id number]  PRIMARY CARE PROVIDER: [name], MD  RESPONSIBLE PARTY:  Acct ID - Guarantor Home Phone Work Phone Relationship Acct Type  [id number] Kristin, [phone]  Self P/F     [address], [place], [place] [address]   Initial Palliative Care Encounter/Clinical Social Work   [facility] SW completed an initial palliative care telephonic encounter with patient. SW provided education regarding palliative care services, role in her care, visit frequency and contact information. Kristin Ward provided an initial consent to services. She also provided a status update.  She report that she does have pain to her leg due to her wounds. The wound is a result of swelling to her leg. She report that she does her own wound care currently, but she is scheduled to go to the wound center on 10/25/22. Patient also has medications to manage her pain. Patient report that she was receiving physical therapy, but had to discontinue it due to her wound getting worse. Patient report that she is independent for personal care needs.   SOCIAL HX: Patient is single and live with her son and daughter. Social History   Tobacco Use   Smoking status: Former    Packs/day: 0.25    Years: 23.00    Total pack years: 5.75    Types: Cigarettes    Quit date: 10/09/2015    Years since quitting: 7.0   Smokeless tobacco: Never  Substance Use Topics   Alcohol use: No    CODE STATUS: DNR ADVANCED DIRECTIVES: No MOST FORM COMPLETE:  Yes HOSPICE EDUCATION PROVIDED: No  Duration of visit encounter and documentation: 30 minutes  **Next appointment is scheduled for 10/30/22 @ 9:30 am.   Kristin Puller, LCSW

## 2022-10-16 ENCOUNTER — Encounter: Payer: Self-pay | Admitting: Podiatry

## 2022-10-16 ENCOUNTER — Encounter: Payer: Self-pay | Admitting: Hematology

## 2022-10-16 ENCOUNTER — Ambulatory Visit: Payer: 59

## 2022-10-16 ENCOUNTER — Ambulatory Visit (INDEPENDENT_AMBULATORY_CARE_PROVIDER_SITE_OTHER): Payer: 59 | Admitting: Podiatry

## 2022-10-16 DIAGNOSIS — B351 Tinea unguium: Secondary | ICD-10-CM | POA: Diagnosis not present

## 2022-10-16 DIAGNOSIS — M79675 Pain in left toe(s): Secondary | ICD-10-CM | POA: Diagnosis not present

## 2022-10-16 DIAGNOSIS — E1165 Type 2 diabetes mellitus with hyperglycemia: Secondary | ICD-10-CM

## 2022-10-16 DIAGNOSIS — R609 Edema, unspecified: Secondary | ICD-10-CM

## 2022-10-16 DIAGNOSIS — M79674 Pain in right toe(s): Secondary | ICD-10-CM | POA: Diagnosis not present

## 2022-10-16 NOTE — Progress Notes (Signed)
This patient presents to the office to have her nails trimmed.  She says the nails are painful walking and wearing her shoes.  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  She has an appointment tonight at [facility] for evaluation of feet. She presents for evaluation and treatment of her nails.  General Appearance  Alert, conversant and in no acute stress.  Vascular  Dorsalis pedis and posterior tibial  pulses are not palpable due to swelling. palpable  bilaterally.  Capillary return is within normal limits  bilaterally. Temperature is within normal limits  bilaterally.  Neurologic  Senn-Weinstein monofilament wire test diminished  bilaterally. Muscle power within normal limits bilaterally.  Nails Thick disfigured discolored nails with subungual debris  from hallux to fifth toes bilaterally. No evidence of bacterial infection or drainage bilaterally.  Orthopedic  No limitations of motion  feet .  No crepitus or effusions noted.  No bony pathology or digital deformities noted. Swelling feet  B/l.  Skin  normotropic skin with no porokeratosis noted bilaterally.  No signs of infections or ulcers noted.     Onychomycosis  Debride nails with nail nipper followed by dremel tool usage.    RTC 10 weeks    [name] DPM

## 2022-10-16 NOTE — Progress Notes (Signed)
[facility]  Telephone:([phone]) [phone] Fax:(336) [phone]   Name: Kristin Ward [facility] Date: 10/16/2022 MRN: [id number]  DOB: [birth year]  Patient Care Team: [name], MD as PCP - General (Family Medicine) [name], MD as PCP - Cardiology (Cardiology) [name], [name], MD as PCP - Advanced Heart Failure (Cardiology) [name], MD as Consulting Physician (Hematology) [name], MD as Consulting Physician (General Surgery) [name], [facility] (Inactive) as Pharmacist (Pharmacist) Jacelyn Pi, MD as Referring Physician (Endocrinology) Care, [facility] as Consulting Physician ([facility])   I connected with Kristin Ward on 10/15/22 at  2:00 PM EST by phone and verified that I am speaking with the correct person using two identifiers.   I discussed the limitations, risks, security and privacy concerns of performing an evaluation and management service by telemedicine and the availability of in-person appointments. I also discussed with the patient that there may be a patient responsible charge related to this service. The patient expressed understanding and agreed to proceed.   Other persons participating in the visit and their role in the encounter: N/A   Patient's location: Home   Provider's location: [place]: Kristin Ward is a 67 y.o. 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 Patient is s/p left breast lumpectomy, adjuvant chemo and Radiation. Biopsy of right gluteus on 09/02/20 due to new pain showed metastatic carcinoma, consistent with breast primary. Palliative ask to see for symptom and pain management and goals of care   SOCIAL HISTORY:     reports that she quit smoking about 7 years ago. Her smoking use included cigarettes. She has a  5.75 pack-year smoking history. She has never used smokeless tobacco. She reports that she does not drink alcohol and does not use drugs.  ADVANCE DIRECTIVES:  MOST and DNR filed  CODE STATUS: DNR  PAST MEDICAL HISTORY: Past Medical History:  Diagnosis Date   Abnormal x-ray of lungs with single pulmonary nodule 03/15/2016   per records from [place]    Anemia    Asthma    Cholelithiasis 05/19/2017   On CT   Coronary artery calcification seen on CAT scan 05/19/2017   Diabetes mellitus without complication ([facility])    type 2   Diabetes mellitus, new onset ([place]) 01/20/2019   Dilated idiopathic cardiomyopathy ([place]) 12/2015   EF 15-20%. Diagnosed in [facility]   Headache    NONE RECENT   Hypertension    Lymph nodes enlarged 07/25/2020   Malignant neoplasm of lower-outer quadrant of left breast of female, estrogen receptor negative ([place]) 06/11/2017   left breast   Mixed hyperlipidemia 06/17/2018   partially blind    Personal history of chemotherapy 2018-2019   Personal history of radiation therapy 2019   Retinitis pigmentosa    Retroperitoneal lymphadenopathy 07/25/2020   Uveitis     ALLERGIES:  is allergic to morphine and related, latex, tylenol with codeine #3 [acetaminophen-codeine], and penicillins.  MEDICATIONS:  Current Outpatient Medications  Medication Sig Dispense Refill   Accu-Chek FastClix Lancets MISC TEST TWICE A DAY. PT USES AN ACCU-CHEK GUIDE ME METER 102 each 2   amLODipine (NORVASC) 5 MG tablet Take 5 mg by mouth daily as needed (HBP).     Ascorbic Acid (VITAMIN C) 500 MG CAPS Take 500 mg by mouth daily.  aspirin 81 MG chewable tablet Chew 81 mg by mouth daily.     atorvastatin (LIPITOR) 40 MG tablet Take 1 tablet (40 mg total) by mouth daily. 90 tablet 3   brimonidine (ALPHAGAN) 0.2 % ophthalmic solution Place 1 drop into both eyes 2 (two) times daily.     carvedilol (COREG) 12.5 MG tablet TAKE 1 AND 1/2 TABLETS BY MOUTH TWICE DAILY qm  (Patient taking differently: Take 18.75 mg by mouth 2 (two) times daily with a meal.) 90 tablet 10   Cholecalciferol (VITAMIN D) 50 MCG (2000 UT) CAPS Take 2,000 Units by mouth daily.     empagliflozin (JARDIANCE) 10 MG TABS tablet Take 1 tablet (10 mg total) by mouth daily before breakfast. 90 tablet 3   furosemide (LASIX) 20 MG tablet Take 40 mg by mouth daily as needed for fluid (weight gain of 3 -4 lbs).     gabapentin (NEURONTIN) 300 MG capsule Take 3 caps twice a day during the day and 4 caps at bedtime (Patient taking differently: Take 600-900 mg by mouth See admin instructions. Take 2 caps ( 600 mg)  twice a day during the day and 3 caps ( 900 mg)  at bedtime) 300 capsule 1   Lancets Misc. (ACCU-CHEK SOFTCLIX LANCET DEV) KIT 1 Units by Does not apply route 2 (two) times daily. Check FSBS BID - Include strips # 50 with 5 refills, Lancets #50 with 5 refills DX: Type 2 DM - ICD 10: E11.9 1 kit 0   lidocaine-prilocaine (EMLA) cream Apply 1 application topically as needed. 30 g 0   linaclotide (LINZESS) 72 MCG capsule Take 1 capsule (72 mcg total) by mouth daily as needed (constipation). 30 capsule 2   Magnesium 300 MG CAPS Take 300 mg by mouth 2 (two) times a week.     Multiple Vitamins-Minerals (WOMENS MULTI PO) Take 15 mLs by mouth daily.     ondansetron (ZOFRAN) 8 MG tablet Take 1 tablet (8 mg total) by mouth every 8 (eight) hours as needed for nausea or vomiting (begin on day 3 after chemo). 30 tablet 1   oxyCODONE 10 MG TABS Take 0.5 tablets (5 mg total) by mouth every 6 (six) hours as needed for severe pain. 60 tablet 0   oxyCODONE ER (XTAMPZA ER) 9 MG C12A Take 9 mg by mouth every 12 (twelve) hours. 15 capsule 0   pantoprazole (PROTONIX) 40 MG tablet TAKE 1 TABLET BY MOUTH EVERY DAY (Patient taking differently: Take 40 mg by mouth daily as needed (heartburn).) 90 tablet 1   polyethylene glycol (MIRALAX / GLYCOLAX) 17 g packet Take 17 g by mouth daily. (Patient not taking: Reported on  10/02/2022) 14 each 0   sacubitril-valsartan (ENTRESTO) 97-103 MG Take 1 tablet by mouth 2 (two) times daily. 60 tablet 10   senna-docusate (SENOKOT-S) 8.6-50 MG tablet Take 1 tablet by mouth 2 (two) times daily. 60 tablet 0   spironolactone (ALDACTONE) 25 MG tablet Take 1 tablet (25 mg total) by mouth daily. 30 tablet 10   vitamin B-12 (CYANOCOBALAMIN) 1000 MCG tablet Take 1,000 mcg by mouth daily.     zinc gluconate 50 MG tablet Take 50 mg by mouth daily.     No current facility-administered medications for this visit.    VITAL SIGNS: There were no vitals taken for this visit. There were no vitals filed for this visit.  Estimated body mass index is 38.43 kg/m as calculated from the following:   Height as of 10/01/22: '5\' 7"'$  (  1.702 m).   Weight as of 10/03/22: 245 lb 6 oz (111.3 kg).   PERFORMANCE STATUS (ECOG) : 1 - Symptomatic but completely ambulatory  IMPRESSION: I connected with Ms. Engram by phone for symptom management follow-up. No acute distress identified. Sivi reports overall she is doing ok despite uncontrolled pain. Reports she is no longer able to tolerate PT due to significance of right leg lymphedema and pain. Denies nausea, vomiting, diarrhea.   She went on a church camping trip over the weekend with their youth. She shares her enjoyment of being able to attend and volunteer her time.   Neoplasm related pain Chealsie continues to complain of right leg pain. States right leg feels tight, constant throb, ache that radiates from right groin down to her foot.    We discussed at length patient's current pain regimen.  She is prescribed Oxycodone 10 mg every 6 hrs as needed for breakthrough pain, Xtampza 9 mg every 12 hours, Gabapentin '600mg'$  twice daily and '900mg'$  at bedtime. After extensive discussion patient is not taking anything except her gabapentin. Sherita reports she has not been able to pick up her medications. I discussed concerns given her significant pain and not  having medications on hand. Her daughter is coming to help her with some things today and run errands. She plans to get her to pick up medications. Education also provided on the ability to have pharmacy [facility]) deliver medications in future due to limited ability to access medication.   No changes at this time allowing patient to obtain current prescriptions and evaluate for effectiveness specifically Xtampza.   Open wound (pressure) to right upper thigh/groin Siyah has an open pressure wound to her right upper thigh.  She endorses cleaning daily as instructed using supplies given. Ms. Rizzolo has an appointment next week with the wound care clinic for additional management.   I also instructed orders will be sent to [facility] home health to offer additional home support. Tammi Sou, RN has sent in PT referral per Dr. Burr Medico.    Goals of Care  [facility]  express wishes to continue to treat the treatable aggressively.  She does recognize that her health continues to be a challenge.  She wishes to continue to take life one day at a time.  I provided education on community based palliative to offer additional support. Farrie verbalized understanding and agreement.   I discussed the importance of continued conversation with family and their medical providers regarding overall plan of care and treatment options, ensuring decisions are within the context of the patients values and GOCs.  PLAN: Oxycodone ER 15 mg every 12 hours (Has not picked up from pharmacy. Daughter plans to get today) Oxycodone IR 10 mg every 6 hours as needed for breakthrough pain Gabapentin 600 mg twice daily, 900 mg at bedtime Continue with senna and MiraLAX for bowel regimen Supplies provided in addition to education on keeping right groin and thigh area clean and dry is much as possible.  Referral placed to wound center for further evaluation and treatment. Ongoing symptom management and goals of care support. Will continue to  closely monitor and adjust medications according to symptoms. I will plan to see patient back in 1-2 weeks in collaboration with her other oncology appointments.   Patient expressed understanding and was in agreement with this plan. She also understands that She can call the clinic at any time with any questions, concerns, or complaints.     Any controlled substances utilized were prescribed in the  context of palliative care. PDMP has been reviewed.    Time Total: 40 min   Visit consisted of counseling and education dealing with the complex and emotionally intense issues of symptom management and palliative care in the setting of serious and potentially life-threatening illness.Greater than 50%  of this time was spent counseling and coordinating care related to the above assessment and plan.  Alda Lea, AGPCNP-BC  Palliative Medicine Team/[facility]

## 2022-10-17 ENCOUNTER — Other Ambulatory Visit: Payer: Self-pay | Admitting: Hematology

## 2022-10-17 ENCOUNTER — Other Ambulatory Visit: Payer: Self-pay | Admitting: Hematology and Oncology

## 2022-10-17 ENCOUNTER — Other Ambulatory Visit: Payer: Self-pay

## 2022-10-17 DIAGNOSIS — C50512 Malignant neoplasm of lower-outer quadrant of left female breast: Secondary | ICD-10-CM

## 2022-10-18 ENCOUNTER — Other Ambulatory Visit: Payer: Self-pay

## 2022-10-18 ENCOUNTER — Telehealth: Payer: Self-pay

## 2022-10-18 DIAGNOSIS — C50512 Malignant neoplasm of lower-outer quadrant of left female breast: Secondary | ICD-10-CM

## 2022-10-18 DIAGNOSIS — C7951 Secondary malignant neoplasm of bone: Secondary | ICD-10-CM

## 2022-10-18 NOTE — Telephone Encounter (Signed)
[facility] notified Palliative Care (Maygan Nyra Capes, RN) that they are not able to accept pt for Home Health d/t short staffing.  A new referral was Health [phone]) for Home Health and possible wound care.  Spoke with Colletta Maryland with [facility] and she stated they could see the pt on Monday 10/22/2022.  Berna Bue the pt's referral order, pt demographics, last office notes of Dr. Burr Medico and Jobe Gibbon, NP (Palliative Care), and medication list.  Fax confirmation received.

## 2022-10-22 ENCOUNTER — Other Ambulatory Visit: Payer: Self-pay | Admitting: Cardiology

## 2022-10-23 MED FILL — Dexamethasone Sodium Phosphate Inj 100 MG/10ML: INTRAMUSCULAR | Qty: 1 | Status: AC

## 2022-10-24 ENCOUNTER — Inpatient Hospital Stay (HOSPITAL_BASED_OUTPATIENT_CLINIC_OR_DEPARTMENT_OTHER): Payer: 59 | Admitting: Hematology

## 2022-10-24 ENCOUNTER — Other Ambulatory Visit: Payer: Self-pay

## 2022-10-24 ENCOUNTER — Encounter: Payer: Self-pay | Admitting: Hematology

## 2022-10-24 ENCOUNTER — Inpatient Hospital Stay: Payer: 59

## 2022-10-24 ENCOUNTER — Telehealth: Payer: Self-pay

## 2022-10-24 VITALS — BP 90/63 | HR 89 | Temp 97.7°F | Resp 18 | Ht 67.0 in

## 2022-10-24 DIAGNOSIS — Z95828 Presence of other vascular implants and grafts: Secondary | ICD-10-CM

## 2022-10-24 DIAGNOSIS — C50512 Malignant neoplasm of lower-outer quadrant of left female breast: Secondary | ICD-10-CM | POA: Diagnosis not present

## 2022-10-24 DIAGNOSIS — G893 Neoplasm related pain (acute) (chronic): Secondary | ICD-10-CM | POA: Diagnosis not present

## 2022-10-24 DIAGNOSIS — G63 Polyneuropathy in diseases classified elsewhere: Secondary | ICD-10-CM | POA: Diagnosis not present

## 2022-10-24 DIAGNOSIS — Z79899 Other long term (current) drug therapy: Secondary | ICD-10-CM | POA: Diagnosis not present

## 2022-10-24 DIAGNOSIS — Z171 Estrogen receptor negative status [ER-]: Secondary | ICD-10-CM | POA: Diagnosis not present

## 2022-10-24 DIAGNOSIS — G629 Polyneuropathy, unspecified: Secondary | ICD-10-CM | POA: Diagnosis not present

## 2022-10-24 DIAGNOSIS — Z923 Personal history of irradiation: Secondary | ICD-10-CM | POA: Diagnosis not present

## 2022-10-24 DIAGNOSIS — C50919 Malignant neoplasm of unspecified site of unspecified female breast: Secondary | ICD-10-CM

## 2022-10-24 DIAGNOSIS — C7951 Secondary malignant neoplasm of bone: Secondary | ICD-10-CM

## 2022-10-24 DIAGNOSIS — Z7984 Long term (current) use of oral hypoglycemic drugs: Secondary | ICD-10-CM | POA: Diagnosis not present

## 2022-10-24 DIAGNOSIS — C7989 Secondary malignant neoplasm of other specified sites: Secondary | ICD-10-CM | POA: Diagnosis not present

## 2022-10-24 DIAGNOSIS — C801 Malignant (primary) neoplasm, unspecified: Secondary | ICD-10-CM | POA: Diagnosis not present

## 2022-10-24 DIAGNOSIS — E119 Type 2 diabetes mellitus without complications: Secondary | ICD-10-CM | POA: Diagnosis not present

## 2022-10-24 DIAGNOSIS — Z515 Encounter for palliative care: Secondary | ICD-10-CM

## 2022-10-24 DIAGNOSIS — Z9221 Personal history of antineoplastic chemotherapy: Secondary | ICD-10-CM | POA: Diagnosis not present

## 2022-10-24 DIAGNOSIS — Z7982 Long term (current) use of aspirin: Secondary | ICD-10-CM | POA: Diagnosis not present

## 2022-10-24 LAB — CMP (CANCER CENTER ONLY)
ALT: 9 U/L (ref 0–44)
AST: 48 U/L — ABNORMAL HIGH (ref 15–41)
Albumin: 3 g/dL — ABNORMAL LOW (ref 3.5–5.0)
Alkaline Phosphatase: 101 U/L (ref 38–126)
Anion gap: 8 (ref 5–15)
BUN: 23 mg/dL (ref 8–23)
CO2: 23 mmol/L (ref 22–32)
Calcium: 8.4 mg/dL — ABNORMAL LOW (ref 8.9–10.3)
Chloride: 107 mmol/L (ref 98–111)
Creatinine: 1.32 mg/dL — ABNORMAL HIGH (ref 0.44–1.00)
GFR, Estimated: 45 mL/min — ABNORMAL LOW (ref >60)
Glucose, Bld: 104 mg/dL — ABNORMAL HIGH (ref 70–99)
Potassium: 4.4 mmol/L (ref 3.5–5.1)
Sodium: 138 mmol/L (ref 135–145)
Total Bilirubin: 0.6 mg/dL (ref 0.3–1.2)
Total Protein: 6.2 g/dL — ABNORMAL LOW (ref 6.5–8.1)

## 2022-10-24 LAB — CBC WITH DIFFERENTIAL (CANCER CENTER ONLY)
Abs Immature Granulocytes: 0.05 10*3/uL (ref 0.00–0.07)
Basophils Absolute: 0 10*3/uL (ref 0.0–0.1)
Basophils Relative: 0 %
Eosinophils Absolute: 0.1 10*3/uL (ref 0.0–0.5)
Eosinophils Relative: 2 %
HCT: 24.1 % — ABNORMAL LOW (ref 36.0–46.0)
Hemoglobin: 8.1 g/dL — ABNORMAL LOW (ref 12.0–15.0)
Immature Granulocytes: 1 %
Lymphocytes Relative: 11 %
Lymphs Abs: 0.7 10*3/uL (ref 0.7–4.0)
MCH: 31.2 pg (ref 26.0–34.0)
MCHC: 33.6 g/dL (ref 30.0–36.0)
MCV: 92.7 fL (ref 80.0–100.0)
Monocytes Absolute: 1 10*3/uL (ref 0.1–1.0)
Monocytes Relative: 16 %
Neutro Abs: 4.4 10*3/uL (ref 1.7–7.7)
Neutrophils Relative %: 70 %
Platelet Count: 532 10*3/uL — ABNORMAL HIGH (ref 150–400)
RBC: 2.6 MIL/uL — ABNORMAL LOW (ref 3.87–5.11)
RDW: 15.7 % — ABNORMAL HIGH (ref 11.5–15.5)
WBC Count: 6.2 10*3/uL (ref 4.0–10.5)
nRBC: 0 % (ref 0.0–0.2)

## 2022-10-24 LAB — TSH: TSH: 6.227 u[IU]/mL — ABNORMAL HIGH (ref 0.350–4.500)

## 2022-10-24 LAB — MAGNESIUM: Magnesium: 2.2 mg/dL (ref 1.7–2.4)

## 2022-10-24 MED ORDER — SODIUM CHLORIDE 0.9% FLUSH
10.0000 mL | INTRAVENOUS | Status: DC | PRN
  Administered 2022-10-24: 10 mL via INTRAVENOUS

## 2022-10-24 MED ORDER — HEPARIN SOD (PORK) LOCK FLUSH 100 UNIT/ML IV SOLN
500.0000 [IU] | Freq: Once | INTRAVENOUS | Status: AC | PRN
  Administered 2022-10-24: 500 [IU] via INTRAVENOUS

## 2022-10-24 MED ORDER — OXYCODONE HCL 5 MG PO TABS
10.0000 mg | ORAL_TABLET | Freq: Once | ORAL | Status: AC
  Administered 2022-10-24: 10 mg via ORAL
  Filled 2022-10-24: qty 2

## 2022-10-24 MED ORDER — XTAMPZA ER 9 MG PO C12A: 9.0000 mg | EXTENDED_RELEASE_CAPSULE | Freq: Two times a day (BID) | ORAL | 0 refills | Status: DC

## 2022-10-24 MED ORDER — SODIUM CHLORIDE 0.9 % IV SOLN: INTRAVENOUS | Status: DC

## 2022-10-24 NOTE — Progress Notes (Signed)
Kristin Ward   [phone] Fax:([phone]) [phone]   Clinic Follow up Note   Patient Care Team: [name], MD as PCP - General (Family Medicine) [name], MD as PCP - Cardiology (Cardiology) [name], [name], MD as PCP - Advanced Heart Failure (Cardiology) [name], MD as Consulting Physician (Hematology) [name], [facility] (Inactive) as Pharmacist (Pharmacist) Jacelyn Pi, MD as Referring Physician (Endocrinology) [facility] [place] as Consulting Physician ([place]) [employer], [place] as Financial risk analyst Physician [facility])  Date of Service:  10/24/2022  CHIEF COMPLAINT: f/u of  metastatic breast cancer    CURRENT THERAPY:  Carbolpatin,+Gemcitabine q21d , Pembrolizumab q21    ASSESSMENT:  Kristin Ward is a 66 y.o. female with   Malignant neoplasm of lower-outer quadrant of left breast of female, estrogen receptor negative (HCC) stage IB, p(T1cN0M0), Triple negative, Grade 3, in 2018, metastatic disease in 08/2020 ER 20% weakly + PR/HER2 (0) negative, PD-L1 0%, genetics (-), HER2 1+ on node biopsy  -diagnosed with triple negative breast cancer in 05/2017. S/p left breast lumpectomy, adjuvant chemo TC and Radiation.  -biopsy of right gluteus on 09/02/20 due to new pain showed metastatic carcinoma, consistent with breast primary. ER 20% weakly positive, PR and HER2 negative.  -s/p multiple line chemo, including taxol, xeloda, Trodelvy, enhertu -she was on [place], but developed worsening neuropathy and stopped after 07/05/22 and she requested a chemo break  -she started carboplatin and gemcitabine, plus Keytruda on 09/11/2022, chemo has been held since first treatment due to her multiple hospital admissions, and restarted 2 weeks ago. -Her clinical condition continues to deteriorate, due to the severe right lymphedema, worsening wound in the right groin area, and poor performance status.  I do not think she is  a candidate for more chemotherapy, and I do not think more chemotherapy will improve her symptoms.  I recommend to focus on symptom management.  After lengthy discussion, she agreed with hospice.  Malignant neoplasm metastatic to bone [facility]) She developed radiating right hip pain in 2020 from right iliac wing mass. S/p palliative radiation to right iliac 09/04/20 - 09/22/20 with Dr. Lisbeth Renshaw. -Given localized bone met, she started Zometa q31month on 11/20/20  Severe right lower extremity lymphedema and wound in the right groin area -Secondary to her pelvic metastasis from breast cancer -She has not being able to do much physical therapy for lymphedema due to her pain -continue pain management and wound care.    Goal of care discussion  -We again discussed the incurable nature of her cancer, and the overall poor prognosis, especially if she does not have good response to chemotherapy or progress on chemo -The patient understands the goal of care is palliative. -Due to her worsening condition, especially difficulty with transportation, I recommend home hospice care.  I discussed that the benefit, logistics of hospice, after lengthy discussion, she agreed.  She also called her daughter, she agreed to. -I recommend DNR/DNI, she agreed.     PLAN: -lab reviewed -[facility]  -Pt agree with Hospice -IV hydration today and oxycodone for pain  -stop chemo   SUMMARY OF ONCOLOGIC HISTORY: Oncology History Overview Note  Cancer Staging Malignant neoplasm of lower-outer quadrant of left breast of female, estrogen receptor negative ([place] Staging form: Breast, AJCC 8th Edition - Clinical stage from 06/06/2017: Stage IB (cT1c, cN0, cM0, G3, ER-, PR-, HER2-) - Signed by FTruitt Merle MD on 06/15/2017 Nuclear grade: G3 Histologic grading system:  3 grade system - Pathologic stage from 06/26/2017: Stage IB (pT1c, pN0, cM0, G3, ER-, PR-, HER2-) - Signed by [name], MD on  07/10/2017 Neoadjuvant therapy: No Nuclear grade: G3 Multigene prognostic tests performed: None Histologic grading system: 3 grade system Laterality: Left     Malignant neoplasm of lower-outer quadrant of left breast of female, estrogen receptor negative ([place])  05/30/2017 Mammogram   Diagnostic mammo and US IMPRESSION: 1. Highly suspicious 1.4 cm mass in the slightly lower slightly outer left breast -tissue sampling recommended. 2. Indeterminate 0.5 mm mass in the slightly lower slightly outer left breast -tissue sampling recommended. 3. At least 2 left axillary lymph nodes with borderline cortical thickness.   06/06/2017 Initial Biopsy   Diagnosis 1. Breast, left, needle core biopsy, 5:30 o'clock - INVASIVE DUCTAL CARCINOMA, G3 2. Lymph node, needle/core biopsy, left axillary - NO CARCINOMA IDENTIFIED IN ONE LYMPH NODE (0/1)   06/06/2017 Initial Diagnosis   Malignant neoplasm of lower-outer quadrant of left breast of female, estrogen receptor negative ([place])   06/06/2017 Receptors her2   Estrogen Receptor: 0%, NEGATIVE Progesterone Receptor: 0%, NEGATIVE Proliferation Marker Ki67: 70%   06/26/2017 Surgery   LEFT BREAST LUMPECTOMY WITH RADIOACTIVE SEED AND SENTINEL LYMPH NODE BIOPSY ERAS  PATHWAY AND INSERTION PORT-A-CATH By Dr. Barry Dienes on 06/26/17    06/26/2017 Pathology Results   Diagnosis 06/26/17  1. Breast, lumpectomy, Left - INVASIVE DUCTAL CARCINOMA, GRADE III/III, SPANNING 1.2 CM. - THE SURGICAL RESECTION MARGINS ARE NEGATIVE FOR CARCINOMA. - SEE ONCOLOGY TABLE BELOW. 2. Lymph node, sentinel, biopsy, Left axillary #1 - THERE IS NO EVIDENCE OF CARCINOMA IN 1 OF 1 LYMPH NODE (0/1). 3. Lymph node, sentinel, biopsy, Left axillary #2 - THERE IS NO EVIDENCE OF CARCINOMA IN 1 OF 1 LYMPH NODE (0/1). 4. Lymph node, sentinel, biopsy, Left axillary #3 - THERE IS NO EVIDENCE OF CARCINOMA IN 1 OF 1 LYMPH NODE (0/1).    07/25/2017 - 09/29/2017 Chemotherapy   Adjuvant  cytoxan and docetaxel (TC) every 3 weekd for 4 cycles     11/19/2017 - 12/17/2017 Radiation Therapy   Adjuvant breast radiation Left breast treated to 42.5 Gy with 17 fx of 2.5 Gy followed by a boost of 7.5 Gy with 3 fx of 2.5 Gy      02/2019 Procedure   She had PAC removal in 02/2019.    07/23/2020 Imaging   MRI Lumbar Spine 07/23/20 IMPRESSION: 1. No significant disc herniation, spinal canal or neural foraminal stenosis at any level. 2. Moderate facet degenerative changes at L3-4, L4-5 and L5-S1. 3. Multiple enlarged retroperitoneal and right iliac lymph nodes. Recommend correlation with CT of the abdomen and pelvis with contrast.   08/15/2020 Imaging   CT AP 08/15/20  IMPRESSION: 1. Right iliac and periaortic adenopathy is noted concerning for metastatic disease or lymphoma. Also noted is abnormal soft tissue mass anterior and posterior to the right iliac wing concerning for malignancy or metastatic disease. MRI is recommended for further evaluation. 2. Irregular lucency with sclerotic margins is seen involving the right iliac crest concerning for possible lytic lesion. MRI may be performed for further evaluation. 3. Enlarged fibroid uterus. 4. Small gallstone. 5. Aortic atherosclerosis.   08/22/2020 Pathology Results   FINAL MICROSCOPIC DIAGNOSIS: 08/22/20  A. NEEDLE CORE, RIGHT GLUTEUS MINIMUMUS, BIOPSY:  -  Metastatic carcinoma  -  See comment   COMMENT:  By immunohistochemistry, the neoplastic cells are positive for  cytokeratin-7 and have weak patchy positivity for GATA3 but are negative for cytokeratin-20  and GCDFP.  The combined morphology and immunophenotype, support metastasis from the patient's known breast primary carcinoma.   ADDENDUM:  PROGNOSTIC INDICATOR RESULTS:  The tumor cells are NEGATIVE for Her2 (0).  Estrogen Receptor:       POSITIVE, 20%, WEAK STAINING  Progesterone Receptor:   NEGATIVE   PD-L1 (0) negative    09/04/2020 - 09/22/2020 Radiation  Therapy   Palliative radiation to right iliac 09/04/20 - 09/22/20, Dr. Lisbeth Renshaw   09/26/2020 -  Chemotherapy   first line weekly Taxol 3 weeks on/1 week off starting 09/26/20 --Reduced to 2 weeks on/1 week off starting with C3 due to neuropathy. Stopped after C4 on 12/18/20 due to neuratrophy.  --Switched to Xeloda on 01/01/21 at '1500mg'$  in the AM and '2000mg'$  in the PM for 2 weeks on/1 week off     10/13/2020 Procedure    PAC placement on 10/13/20.    04/10/2021 Imaging   CT CAP  IMPRESSION: 1. Enlarging abdominopelvic retroperitoneal and right inguinal adenopathy, compatible with disease progression. 2. Lytic metastasis in the right iliac wing, similar. 3. Hepatomegaly. 4. Cholelithiasis. 5. Enlarged fibroid uterus. 6.  Aortic atherosclerosis (ICD10-I70.0).   06/18/2021 Imaging   CT CAP  IMPRESSION: 1. Today's study demonstrates progression of metastatic disease as evidence by increased number and size of numerous enlarged lymph nodes in the retroperitoneum, along the right pelvic sidewall, and in the upper right thigh. 2. Osseous metastases in the bony pelvis appear similar to the prior examination. No definite new osseous lesions are otherwise noted. 3. Fibroid uterus again noted. 4. Cholelithiasis without evidence of acute cholecystitis. 5. Aortic atherosclerosis, in addition to left anterior descending coronary artery disease. Please note that although the presence of coronary artery calcium documents the presence of coronary artery disease, the severity of this disease and any potential stenosis cannot be assessed on this non-gated CT examination. Assessment for potential risk factor modification, dietary therapy or pharmacologic therapy may be warranted, if clinically indicated. 6. Additional incidental findings, as above.   06/20/2021 Imaging   Bone Scan  IMPRESSION: No osseous uptake suspicious for recurrent/progressive metastatic disease.   07/03/2021 - 01/11/2022 Chemotherapy    Patient is on Treatment Plan : BREAST METASTATIC Sacituzumab govitecan-hziy [name]) q21d     09/27/2021 Imaging   EXAM: CT CHEST, ABDOMEN, AND PELVIS WITH CONTRAST  IMPRESSION: 1. Marked interval improvement in previously demonstrated lymphadenopathy in the abdomen and pelvis. A few prominent right inguinal lymph nodes remain. 2. Stable osseous metastatic disease in the pelvis. 3. No evidence of local recurrence or other metastatic disease. 4. Stable incidental findings including cholelithiasis, uterine fibroids and Aortic Atherosclerosis (ICD10-I70.0).  ADDENDUM: Not mentioned in the original impression is a possible nonocclusive fibrin sheath along the distal aspect of the Port-A-Cath.   09/27/2021 Imaging   EXAM: NUCLEAR MEDICINE WHOLE BODY BONE SCAN  IMPRESSION: Stable examination, without abnormal osseous uptake.   11/27/2021 Imaging   CT AP IMPRESSION: 1. Stable lytic and sclerotic lesion in the RIGHT iliac wing. 2. No evidence of visceral metastasis or adenopathy in the abdomenpelvis. 3. Leiomyomatous uterus.  ADDENDUM REPORT: 11/29/2021 09:33 Upon further review and discussion with Cira Rue NP, the RIGHT inguinal lymph nodes previous described have increased in volume. For example 14 mm node (image 91/2) is increased from 9 mm. Adjacent 14 mm node on image 86 is increased from 8 mm. Additionally, LEFT common iliac lymph node is also slightly increased measuring 10 mm (46/2) compared to 7 mm on comparison exam from 09/27/2021.  These sites demonstrates similar but greater lymphadenopathy on CT 06/18/2021.   02/15/2022 - 03/08/2022 Chemotherapy   Patient is on Treatment Plan : BREAST METASTATIC fam-trastuzumab deruxtecan-nxki (Enhertu) q21d     02/15/2022 - 05/10/2022 Chemotherapy   Patient is on Treatment Plan : BREAST METASTATIC Fam-Trastuzumab Deruxtecan-nxki (Enhertu) (5.4) q21d     06/07/2022 - 07/05/2022 Chemotherapy   Patient is on Treatment Plan : BREAST  METASTATIC Eribulin D1,8 q21d     07/26/2022 - 07/26/2022 Chemotherapy   Patient is on Treatment Plan : HEAD/NECK Pembrolizumab (200) q21d     09/11/2022 - 09/11/2022 Chemotherapy   Patient is on Treatment Plan : HEAD/NECK Pembrolizumab (200) q21d     09/11/2022 - 09/11/2022 Chemotherapy   Patient is on Treatment Plan : BREAST Carboplatin + Gemcitabine D1,8 q21d     09/19/2022 - 10/10/2022 Chemotherapy   Patient is on Treatment Plan : BREAST Pembrolizumab (200) D1 + Carboplatin (2) D1,8 + Gemcitabine (1000) D1,8 q21d     10/01/2022 Imaging    IMPRESSION: 1. No significant interval change from CT 09/18/2022. No evidence of metastatic disease progression. 2. Interval stenting of the common iliac veins and proximal inferior vena cava. Persistent mild RIGHT hydronephrosis and hydroureter. Persistent RIGHT lower extremity edema. 3. Mild periaortic retroperitoneal adenopathy not changed from prior. 4. Stable enlarged uterus. 5. Postoperative seroma in the LEFT breast   Malignant neoplasm metastatic to bone ([place])  08/30/2020 Initial Diagnosis   Malignant neoplasm metastatic to bone ([place])   10/01/2022 Imaging    IMPRESSION: 1. No significant interval change from CT 09/18/2022. No evidence of metastatic disease progression. 2. Interval stenting of the common iliac veins and proximal inferior vena cava. Persistent mild RIGHT hydronephrosis and hydroureter. Persistent RIGHT lower extremity edema. 3. Mild periaortic retroperitoneal adenopathy not changed from prior. 4. Stable enlarged uterus. 5. Postoperative seroma in the LEFT breast   Neuropathy associated with cancer ([place])  11/21/2021 Initial Diagnosis   Neuropathy associated with cancer [facility])      INTERVAL HISTORY:  Kristin Ward is here for a follow up of  metastatic breast cancer   She was last seen by me on 10/10/2022 She presents to the clinic alone.Pt state that last treatment went well. Pt is in a lot of pain due to an  open wound. Pt appetite is not good , she state she eats one meal a day, like a Sandwich. Pt states she drinks 4 bottle of water a day. Pt state that the pain subside when its has dressings on it. Pt state its harder for to lay on her left side. Pt report she sleep sitting up Pt is using wheel chair now.      All other systems were reviewed with the patient and are negative.  MEDICAL HISTORY:  Past Medical History:  Diagnosis Date   Abnormal x-ray of lungs with single pulmonary nodule 03/15/2016   per records from [place]    Anemia    Asthma    Cholelithiasis 05/19/2017   On CT   Coronary artery calcification seen on CAT scan 05/19/2017   Diabetes mellitus without complication ([place])    type 2   Diabetes mellitus, new onset ([place]) 01/20/2019   Dilated idiopathic cardiomyopathy ([place]) 12/2015   EF 15-20%. Diagnosed in [facility]   Headache    NONE RECENT   Hypertension    Lymph nodes enlarged 07/25/2020   Malignant neoplasm of lower-outer quadrant of left breast of female, estrogen receptor negative ([place]) 06/11/2017   left  breast   Mixed hyperlipidemia 06/17/2018   partially blind    Personal history of chemotherapy 2018-2019   Personal history of radiation therapy 2019   Retinitis pigmentosa    Retroperitoneal lymphadenopathy 07/25/2020   Uveitis     SURGICAL HISTORY: Past Surgical History:  Procedure Laterality Date   brain cyst removed  2007   to help with headaches per patient , aspirated    BREAST BIOPSY Left 06/06/2017   x2   BREAST CYST ASPIRATION Left 06/06/2017   BREAST LUMPECTOMY Left 06/26/2017   BREAST LUMPECTOMY WITH RADIOACTIVE SEED AND SENTINEL LYMPH NODE BIOPSY Left 06/26/2017   Procedure: BREAST LUMPECTOMY WITH RADIOACTIVE SEED AND SENTINEL LYMPH NODE BIOPSY ERAS  PATHWAY;  Surgeon: [name], MD;  Location: [place];  Service: General;  Laterality: Left;  pec block   FINE NEEDLE ASPIRATION Left 02/23/2019   Procedure: FINE NEEDLE  ASPIRATION;  Surgeon: [name], MD;  Location: [place];  Service: General;  Laterality: Left;  Asiration of left breast seroma    IR IMAGING GUIDED PORT INSERTION  10/13/2020   IR IVUS EACH ADDITIONAL NON CORONARY VESSEL  09/24/2022   IR IVUS EACH ADDITIONAL NON CORONARY VESSEL  09/24/2022   IR TRANSCATH PLC STENT  EA ADD VEIN  INC ANGIOPLASTY  09/25/2022   IR TRANSCATH PLC STENT  INITIAL VEIN  INC ANGIOPLASTY  09/24/2022   IR US GUIDE VASC ACCESS RIGHT  09/24/2022   IR VENO/EXT/BI  09/23/2022   IR VENO/EXT/UNI LEFT  09/24/2022   IR VENO/EXT/UNI RIGHT  09/24/2022   IR VENOCAVAGRAM IVC  09/25/2022   NASAL TURBINATE REDUCTION     PORT-A-CATH REMOVAL Right 02/23/2019   Procedure: REMOVAL PORT-A-CATH;  Surgeon: [name], MD;  Location: [place];  Service: General;  Laterality: Right;   PORTACATH PLACEMENT N/A 06/26/2017   Procedure: INSERTION PORT-A-CATH;  Surgeon: [name], MD;  Location: [place];  Service: General;  Laterality: N/A;   RADIOLOGY WITH ANESTHESIA Right 09/24/2022   Procedure: ARTERIOGRAM RIGHT LEG;  Surgeon: Radiologist, Medication, MD;  Location: [place];  Service: Radiology;  Laterality: Right;   RIGHT/LEFT HEART CATH AND CORONARY ANGIOGRAPHY N/A 07/31/2022   Procedure: RIGHT/LEFT HEART CATH AND CORONARY ANGIOGRAPHY;  Surgeon: Jolaine Artist, MD;  Location: [place] [facility];  Service: Cardiovascular;  Laterality: N/A;   TRANSTHORACIC ECHOCARDIOGRAM  12/2015   A) [facility] May 2017: Mild concentric LVH. Global hypokinesis. GR daily. EF 18% severe LA dilation. Mitral annular dilatation with papillary muscle dysfunction and moderate MR. Dilated IVC consistent with elevated RAP.   TRANSTHORACIC ECHOCARDIOGRAM  05/2017; 08/2017   a) Mild concentric LVH.  EF 45 to 50% with diffuse hypokinesis.  GR 1 DD.  Mild aortic root dilation.;; b)  Mild LVH EF 45 to 50%.  Diffuse HK.  No significant valve disease.  No significant change.    TRANSTHORACIC ECHOCARDIOGRAM  01/30/2022   Pre-Enhertu:EF 35 to 40%.  Moderate dysfunction with global HK.  Abnormal strain.  Normal RV size and function.  Normal RVSP.  Aortic root estimated 42 mm.  Mild MR.  Mild/trivial AI.  Normal RVP.;  04/01/2022: Preliminary read 40%.  Formal read 45 to 50% (probably 40 to 45%).  Global HK.  GR 1 DD.  Normal RV size and RVP.  Mild MR.  Now measured at 38 mm.  Normal RAP.    I have reviewed the social history and family history with the patient and they are unchanged from previous note.  ALLERGIES:  is allergic to morphine and related, latex, tylenol with codeine #3 [acetaminophen-codeine], and penicillins.  MEDICATIONS:  Current Outpatient Medications  Medication Sig Dispense Refill   Accu-Chek FastClix Lancets MISC TEST TWICE A DAY. PT USES AN ACCU-CHEK GUIDE ME METER 102 each 2   amLODipine (NORVASC) 5 MG tablet Take 5 mg by mouth daily as needed (HBP).     Ascorbic Acid (VITAMIN C) 500 MG CAPS Take 500 mg by mouth daily.     aspirin 81 MG chewable tablet Chew 81 mg by mouth daily.     atorvastatin (LIPITOR) 40 MG tablet Take 1 tablet (40 mg total) by mouth daily. 90 tablet 3   brimonidine (ALPHAGAN) 0.2 % ophthalmic solution Place 1 drop into both eyes 2 (two) times daily.     carvedilol (COREG) 12.5 MG tablet TAKE 1 AND 1/2 TABLETS BY MOUTH TWICE DAILY qm (Patient taking differently: Take 18.75 mg by mouth 2 (two) times daily with a meal.) 90 tablet 10   Cholecalciferol (VITAMIN D) 50 MCG (2000 UT) CAPS Take 2,000 Units by mouth daily.     empagliflozin (JARDIANCE) 10 MG TABS tablet Take 1 tablet (10 mg total) by mouth daily before breakfast. 90 tablet 3   furosemide (LASIX) 20 MG tablet Take 40 mg by mouth daily as needed for fluid (weight gain of 3 -4 lbs).     gabapentin (NEURONTIN) 300 MG capsule Take 3 caps twice a day during the day and 4 caps at bedtime (Patient taking differently: Take 600-900 mg by mouth See admin instructions. Take 2 caps (  600 mg)  twice a day during the day and 3 caps ( 900 mg)  at bedtime) 300 capsule 1   Lancets Misc. (ACCU-CHEK SOFTCLIX LANCET DEV) KIT 1 Units by Does not apply route 2 (two) times daily. Check FSBS BID - Include strips # 50 with 5 refills, Lancets #50 with 5 refills DX: Type 2 DM - ICD 10: E11.9 1 kit 0   lidocaine-prilocaine (EMLA) cream Apply 1 application topically as needed. 30 g 0   linaclotide (LINZESS) 72 MCG capsule Take 1 capsule (72 mcg total) by mouth daily as needed (constipation). 30 capsule 2   Magnesium 300 MG CAPS Take 300 mg by mouth 2 (two) times a week.     Multiple Vitamins-Minerals (WOMENS MULTI PO) Take 15 mLs by mouth daily.     ondansetron (ZOFRAN) 8 MG tablet Take 1 tablet (8 mg total) by mouth every 8 (eight) hours as needed for nausea or vomiting (begin on day 3 after chemo). 30 tablet 1   oxyCODONE 10 MG TABS Take 0.5 tablets (5 mg total) by mouth every 6 (six) hours as needed for severe pain. 60 tablet 0   oxyCODONE ER (XTAMPZA ER) 9 MG C12A Take 9 mg by mouth every 12 (twelve) hours. 15 capsule 0   pantoprazole (PROTONIX) 40 MG tablet TAKE 1 TABLET BY MOUTH EVERY DAY (Patient taking differently: Take 40 mg by mouth daily as needed (heartburn).) 90 tablet 1   polyethylene glycol (MIRALAX / GLYCOLAX) 17 g packet Take 17 g by mouth daily. (Patient not taking: Reported on 10/02/2022) 14 each 0   sacubitril-valsartan (ENTRESTO) 97-103 MG TAKE 1 TABLET BY MOUTH TWICE A DAY 180 tablet 1   senna-docusate (SENOKOT-S) 8.6-50 MG tablet Take 1 tablet by mouth 2 (two) times daily. 60 tablet 0   spironolactone (ALDACTONE) 25 MG tablet Take 1 tablet (25 mg total) by mouth daily. 30 tablet 10  vitamin B-12 (CYANOCOBALAMIN) 1000 MCG tablet Take 1,000 mcg by mouth daily.     zinc gluconate 50 MG tablet Take 50 mg by mouth daily.     No current facility-administered medications for this visit.   Facility-Administered Medications Ordered in Other Visits  Medication Dose Route  Frequency Provider Last Rate Last Admin   0.9 %  sodium chloride infusion   Intravenous Continuous [name], MD   Stopped at 10/24/22 1620   sodium chloride flush (NS) 0.9 % injection 10 mL  10 mL Intravenous PRN [name], MD   10 mL at 10/24/22 1617    PHYSICAL EXAMINATION: ECOG PERFORMANCE STATUS: 3 - Symptomatic, >50% confined to bed  Vitals:   10/24/22 1341  BP: 90/63  Pulse: 89  Resp: 18  Temp: 97.7 F (36.5 C)  SpO2: 98%   Wt Readings from Last 3 Encounters:  10/03/22 245 lb 6 oz (111.3 kg)  09/24/22 223 lb (101.2 kg)  09/11/22 225 lb 4 oz (102.2 kg)     GENERAL:alert, no distress and comfortable SKIN: skin color normal, no rashes or significant lesions EYES: normal, Conjunctiva are pink and non-injected, sclera clear  NEURO: alert & oriented x 3 with fluent speech   LABORATORY DATA:  I have reviewed the data as listed    Latest Ref Rng & Units 10/24/2022    1:14 PM 10/10/2022   11:54 AM 10/03/2022    5:51 AM  CBC  WBC 4.0 - 10.5 K/uL 6.2  11.7  7.8   Hemoglobin 12.0 - 15.0 g/dL 8.1  9.5  9.5   Hematocrit 36.0 - 46.0 % 24.1  28.8  29.9   Platelets 150 - 400 K/uL 532  289  400         Latest Ref Rng & Units 10/24/2022    1:14 PM 10/10/2022   11:54 AM 10/03/2022    5:51 AM  CMP  Glucose 70 - 99 mg/dL 104  75  86   BUN 8 - 23 mg/dL '23  17  21   '$ Creatinine 0.44 - 1.00 mg/dL 1.32  0.99  1.19   Sodium 135 - 145 mmol/L 138  140  136   Potassium 3.5 - 5.1 mmol/L 4.4  4.3  4.2   Chloride 98 - 111 mmol/L 107  110  110   CO2 22 - 32 mmol/L '23  24  19   '$ Calcium 8.9 - 10.3 mg/dL 8.4  8.1  8.3   Total Protein 6.5 - 8.1 g/dL 6.2  5.5    Total Bilirubin 0.3 - 1.2 mg/dL 0.6  0.5    Alkaline Phos 38 - 126 U/L 101  90    AST 15 - 41 U/L 48  28    ALT 0 - 44 U/L 9  8        RADIOGRAPHIC STUDIES: I have personally reviewed the radiological images as listed and agreed with the findings in the report. No results found.    Orders Placed This Encounter  Procedures    Ambulatory referral to Hospice    Referral Priority:   Urgent    Referral Type:   Consultation    Referral Reason:   Specialty Services Required    Requested Specialty:   Hospice Services    Number of Visits Requested:   1   Do not attempt resuscitation (DNR)    Order Specific Question:   If patient has no pulse and is not breathing    Answer:  Do Not Attempt Resuscitation    Order Specific Question:   If patient has a pulse and/or is breathing: Medical Treatment Goals    Answer:   COMFORT MEASURES: Keep clean/warm/dry, use medication by any route; positioning, wound care and other measures to relieve pain/suffering; use oxygen, suction/manual treatment of airway obstruction for comfort; do not transfer unless for comfort needs.    Order Specific Question:   Consent:    Answer:   Discussion documented in EHR or advanced directives reviewed   All questions were answered. The patient knows to call the clinic with any problems, questions or concerns. No barriers to learning was detected. The total time spent in the appointment was 40 minutes.     [name], MD 10/24/2022   Felicity Coyer, CMA, am acting as scribe for [name], MD.   I have reviewed the above documentation for accuracy and completeness, and I agree with the above.

## 2022-10-24 NOTE — Assessment & Plan Note (Signed)
stage IB, p(T1cN0M0), Triple negative, Grade 3, in 2018, metastatic disease in 08/2020 ER 20% weakly + PR/HER2 (0) negative, PD-L1 0%, genetics (-), HER2 1+ on node biopsy  -diagnosed with triple negative breast cancer in 05/2017. S/p left breast lumpectomy, adjuvant chemo TC and Radiation.  -biopsy of right gluteus on 09/02/20 due to new pain showed metastatic carcinoma, consistent with breast primary. ER 20% weakly positive, PR and HER2 negative.  -s/p multiple line chemo, including taxol, xeloda, Trodelvy, enhertu -she was on [place], but developed worsening neuropathy and stopped after 07/05/22 and she requested a chemo break  -she started carboplatin and gemcitabine, plus Keytruda on 09/11/2022, chemo has been held since first treatment due to her multiple hospital admissions.

## 2022-10-24 NOTE — Assessment & Plan Note (Signed)
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 S/p palliative radiation to right iliac 09/04/20 - 09/22/20 with Dr. Moody. -Given localized bone met, she started Zometa q3months on 11/20/20 

## 2022-10-24 NOTE — Telephone Encounter (Signed)
Called patient daughter wanting to find out if patient was going to come for her appointment. Daughter stated her mother had an episode and was very weak and unable to walk she had to slide down the steps to get her into the car to bring her. She stated the wound in her groin was very pungent and she can't stand at all. I told her when she got her to check in for labs and Dr. Lewayne Bunting appointment and she would see her.

## 2022-10-25 ENCOUNTER — Other Ambulatory Visit: Payer: Self-pay

## 2022-10-25 ENCOUNTER — Encounter (HOSPITAL_BASED_OUTPATIENT_CLINIC_OR_DEPARTMENT_OTHER): Payer: 59 | Attending: General Surgery | Admitting: Internal Medicine

## 2022-10-25 ENCOUNTER — Other Ambulatory Visit: Payer: Self-pay | Admitting: Hematology

## 2022-10-25 DIAGNOSIS — E11622 Type 2 diabetes mellitus with other skin ulcer: Secondary | ICD-10-CM

## 2022-10-25 DIAGNOSIS — S31103A Unspecified open wound of abdominal wall, right lower quadrant without penetration into peritoneal cavity, initial encounter: Secondary | ICD-10-CM | POA: Insufficient documentation

## 2022-10-25 DIAGNOSIS — Z923 Personal history of irradiation: Secondary | ICD-10-CM | POA: Insufficient documentation

## 2022-10-25 DIAGNOSIS — S81801A Unspecified open wound, right lower leg, initial encounter: Secondary | ICD-10-CM | POA: Insufficient documentation

## 2022-10-25 DIAGNOSIS — C7951 Secondary malignant neoplasm of bone: Secondary | ICD-10-CM

## 2022-10-25 DIAGNOSIS — I10 Essential (primary) hypertension: Secondary | ICD-10-CM | POA: Insufficient documentation

## 2022-10-25 DIAGNOSIS — I2510 Atherosclerotic heart disease of native coronary artery without angina pectoris: Secondary | ICD-10-CM | POA: Insufficient documentation

## 2022-10-25 DIAGNOSIS — Z9221 Personal history of antineoplastic chemotherapy: Secondary | ICD-10-CM | POA: Insufficient documentation

## 2022-10-25 DIAGNOSIS — Z515 Encounter for palliative care: Secondary | ICD-10-CM

## 2022-10-25 DIAGNOSIS — C801 Malignant (primary) neoplasm, unspecified: Secondary | ICD-10-CM

## 2022-10-25 DIAGNOSIS — C50512 Malignant neoplasm of lower-outer quadrant of left female breast: Secondary | ICD-10-CM | POA: Diagnosis present

## 2022-10-25 DIAGNOSIS — X58XXXA Exposure to other specified factors, initial encounter: Secondary | ICD-10-CM | POA: Insufficient documentation

## 2022-10-25 DIAGNOSIS — I890 Lymphedema, not elsewhere classified: Secondary | ICD-10-CM | POA: Insufficient documentation

## 2022-10-25 LAB — T4: T4, Total: 8.4 ug/dL (ref 4.5–12.0)

## 2022-10-25 MED ORDER — OXYCODONE HCL 10 MG PO TABS: ORAL_TABLET | ORAL | 0 refills | Status: DC

## 2022-10-25 MED ORDER — FENTANYL 25 MCG/HR TD PT72: 1.0000 | MEDICATED_PATCH | TRANSDERMAL | 0 refills | Status: DC

## 2022-10-25 MED ORDER — CYCLOBENZAPRINE HCL 5 MG PO TABS: 5.0000 mg | ORAL_TABLET | Freq: Three times a day (TID) | ORAL | 0 refills | Status: DC | PRN

## 2022-10-26 NOTE — Progress Notes (Signed)
Kristin, Ward Ward ([id number]) [id number] Page 1 of 6 Visit Report for 10/25/2022 Chief Complaint Document Details Patient Name: Date of Service: Kristin Ward, [place] Kristin Ward. 10/25/2022 8:00 Ward M Medical Record Number: [id number] Patient Account Number: [id number] Date of Birth/Sex: Treating RN: 10-Nov-1955 (67 y.o. F) Primary Care Provider: Jill Ward Other Clinician: Referring Provider: Treating Provider/Extender: Venetia Constable in Treatment: 0 Information Obtained from: Patient Chief Complaint 10/25/2022; right groin wound Electronic Signature(s) Signed: 10/25/2022 12:33:12 PM By: Kalman Shan DO Entered By: Kalman Shan on 10/25/2022 10:17:12 -------------------------------------------------------------------------------- HPI Details Patient Name: Date of Service: Kristin Blend, PA Kristin Ward. 10/25/2022 8:00 Ward M Medical Record Number: [id number] Patient Account Number: [id number] Date of Birth/Sex: Treating RN: 1956-02-08 (67 y.o. F) Primary Care Provider: Jill Ward Other Clinician: Referring Provider: Treating Provider/Extender: Venetia Constable in Treatment: 0 History of Present Illness HPI Description: 10/25/2022 [name] is [name] with Ward past medical history of metastatic left breast cancer. She has been taking chemotherapy however yesterday she was deemed not Ward candidate for more chemotherapy by her oncologist Due to her decline in health. They discussed hospice and patient would like to proceed with this. She does have Ward groin wound as she has significant lymphedema to the right leg Secondary her Metastatic breast cancer. She would like further recommendations on wound care management. She currently denies signs of infection. Electronic Signature(s) Signed: 10/25/2022 12:33:12 PM By: Kalman Shan DO Entered By: Kalman Shan on 10/25/2022  10:20:03 -------------------------------------------------------------------------------- Physical Exam Details Patient Name: Date of Service: Kristin Blend, PA Kristin Ward. 10/25/2022 8:00 Ward M Medical Record Number: [id number] Patient Account Number: [id number] Date of Birth/Sex: Treating RN: 1956-07-24 (67 y.o. F) Primary Care Provider: Jill Ward Other Clinician: Referring Provider: Treating Provider/Extender: Venetia Constable in Treatment: 0 Constitutional respirations regular, non-labored and within target range for patient.. Cardiovascular 2+ dorsalis pedis/posterior tibialis pulses. Psychiatric pleasant and cooperative. Notes Right lower extremity with significant lymphedema and open wound to the groin that appears suspicious for malignancy. Mild odor on exam. No signs of Manzano, Enisa Ward ([id number]) [phone].pdf Page 2 of 6 surrounding soft tissue infection. Electronic Signature(s) Signed: 10/25/2022 12:33:12 PM By: Kalman Shan DO Entered By: Kalman Shan on 10/25/2022 10:20:57 -------------------------------------------------------------------------------- Physician Orders Details Patient Name: Date of Service: Kristin Blend, PA Kristin Ward. 10/25/2022 8:00 Ward M Medical Record Number: [id number] Patient Account Number: [id number] Date of Birth/Sex: Treating RN: 01-30-1956 (67 y.o. Kristin Ward Primary Care Provider: Jill Ward Other Clinician: Referring Provider: Treating Provider/Extender: Venetia Constable in Treatment: 0 Verbal / Phone Orders: No Diagnosis Coding ICD-10 Coding Code Description C50.512 Malignant neoplasm of lower-outer quadrant of left female breast C79.51 Secondary malignant neoplasm of bone E11.622 Type 2 diabetes mellitus with other skin ulcer I89.0 Lymphedema, not elsewhere classified Follow-up Appointments Return appointment in 1 month. - Dr. Heber [place] Room 6 Other: - Can  purchase VASHE or Dakin's solution (half strength 0.25% Sodium Hypochlorite) from [employer] about $25. Anesthetic (In clinic) Topical Lidocaine 4% applied to wound bed - in clinic only Off-Loading Wound #1 Right Groin Other: - Rest throughout the day with legs elevated, as tolerated. Wound Treatment Wound #1 - Groin Wound Laterality: Right Prim Dressing: Dakin's Solution 0.25%, 16 (oz) 1 x Per Day/30 Days ary Discharge Instructions: Moisten gauze with Dakin's solution Prim Dressing: VASHE (or Dakin's) 1 x Per Day/30 Days ary Discharge Instructions: using wet to dry gauze Secondary Dressing: Woven Gauze Sponge, Non-Sterile  4x4 in 1 x Per Day/30 Days Discharge Instructions: moisten Vashe or Dakin's then place in wound at Right groin. Electronic Signature(s) Signed: 10/25/2022 12:33:12 PM By: Kalman Shan DO Entered By: Kalman Shan on 10/25/2022 10:21:02 -------------------------------------------------------------------------------- Problem List Details Patient Name: Date of Service: Kristin Blend, PA Kristin Ward. 10/25/2022 8:00 Ward M Medical Record Number: [id number] Patient Account Number: [id number] Date of Birth/Sex: Treating RN: [birth year] (67 y.o. Kristin Ward Primary Care Provider: Jill Ward Other Clinician: Referring Provider: Treating Provider/Extender: Venetia Constable in Treatment: 0 Active Problems ICD-10 Encounter Code Description Active Date MDM Kristin Ward, Kristin Ward ([id number]) [id number] Page 3 of 6 Code Description Active Date MDM Diagnosis S81.801A Unspecified open wound, right lower leg, initial encounter 10/25/2022 No Yes C50.512 Malignant neoplasm of lower-outer quadrant of left female breast 10/25/2022 No Yes C79.51 Secondary malignant neoplasm of bone 10/25/2022 No Yes E11.622 Type 2 diabetes mellitus with other skin ulcer 10/25/2022 No Yes I89.0 Lymphedema, not elsewhere classified 10/25/2022 No  Yes Inactive Problems Resolved Problems Electronic Signature(s) Signed: 10/25/2022 12:33:12 PM By: Kalman Shan DO Entered By: Kalman Shan on 10/25/2022 10:16:47 -------------------------------------------------------------------------------- Progress Note Details Patient Name: Date of Service: Kristin Blend, PA Kristin Ward. 10/25/2022 8:00 Ward M Medical Record Number: [id number] Patient Account Number: [id number] Date of Birth/Sex: Treating RN: 06-22-1956 (67 y.o. F) Primary Care Provider: Jill Ward Other Clinician: Referring Provider: Treating Provider/Extender: Venetia Constable in Treatment: 0 Subjective Chief Complaint Information obtained from Patient 10/25/2022; right groin wound History of Present Illness (HPI) 10/25/2022 [name] is [name] with Ward past medical history of metastatic left breast cancer. She has been taking chemotherapy however yesterday she was deemed not Ward candidate for more chemotherapy by her oncologist Due to her decline in health. They discussed hospice and patient would like to proceed with this. She does have Ward groin wound as she has significant lymphedema to the right leg Secondary her Metastatic breast cancer. She would like further recommendations on wound care management. She currently denies signs of infection. Patient History Allergies morphine, latex, codeine, penicillin Social History Never smoker, Marital Status - Widowed, Alcohol Use - Never, Drug Use - No History, Caffeine Use - Never. Medical History Hematologic/Lymphatic Patient has history of Anemia, Lymphedema Cardiovascular Patient has history of Coronary Artery Disease, Hypertension Endocrine Patient has history of Type II Diabetes Oncologic Patient has history of Received Chemotherapy, Received Radiation Hospitalization/Surgery History - biopsy left breast. 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 malignant neoplasm to left breast 2018 Review of Systems (ROS) Eyes Complains or has symptoms of Glasses / Contacts. Integumentary (Skin) Complains or has symptoms of Wounds - right groin. Objective Constitutional respirations regular, non-labored and within target range for patient.. Vitals Time Taken: 8:29 AM, Height: 67 in, Source: Stated, Source: Stated, Temperature: 98.4 F, Pulse: 95 bpm, Respiratory Rate: 20 breaths/min, Blood Pressure: 82/50 mmHg. Cardiovascular 2+ dorsalis pedis/posterior tibialis pulses. Psychiatric pleasant and cooperative. General Notes: Right lower extremity with significant lymphedema and open wound to the groin that appears suspicious for malignancy. Mild odor on exam. No signs of surrounding soft tissue infection. Integumentary (Hair, Skin) Wound #1 status is Open. Original cause of wound was Gradually Appeared. The date acquired was: 10/06/2022. The wound is located on the Right Groin. The wound measures 6.4cm length x 20cm width x  1.5cm depth; 100.531cm^2 area and 150.796cm^3 volume. There is no tunneling or undermining noted. There is Ward large amount of purulent drainage noted. Foul odor after cleansing was noted. The wound margin is distinct with the outline attached to the wound base. There is no granulation within the wound bed. There is Ward large (67-100%) amount of necrotic tissue within the wound bed including Adherent Slough. The periwound skin appearance did not exhibit: Callus, Crepitus, Excoriation, Induration, Rash, Scarring, Dry/Scaly, Maceration, Atrophie Blanche, Cyanosis, Ecchymosis, Hemosiderin Staining, Mottled, Pallor, Rubor, Erythema. 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 Her oncologist has stopped chemotherapy and plan is for hospice. The wound could potentially be malignant. Obviously this will not heal. I gave her recommendations to help with symptom control. At this time I recommended Vashe wet-to-dry dressings or Dakin's wet-to-dry dressings. Will see her back in Ward month if she does not end up going through with hospice. Plan Follow-up Appointments: Return appointment in 1 month. - Dr. Heber [place] Room 6 Other: - Can purchase VASHE or Dakin's solution (half strength 0.25% Sodium Hypochlorite) from [employer] about $25. Anesthetic: (In clinic) Topical Lidocaine 4% applied to wound bed - in clinic only Off-Loading: Wound #1 Right Groin: Other: - Rest throughout the day with legs elevated, as tolerated. WOUND #1: - Groin Wound Laterality: Right Prim Dressing: Dakin's Solution 0.25%, 16 (oz) 1 x Per Day/30 Days ary Discharge Instructions: Moisten gauze with Dakin's solution Prim Dressing: VASHE (or Dakin's) 1 x Per Day/30 Days ary Discharge Instructions: using wet to dry gauze Secondary Dressing: Woven Gauze Sponge, Non-Sterile 4x4 in 1 x Per Day/30 Days Discharge Instructions: moisten Vashe or Dakin's then place in wound at Right groin. Kristin Ward, Kristin Ward ([id number]) [id number] Page 5 of 6 1. Vashe wet-to-dry dressings 2. Follow-up in 1 month if not entered into hospice Electronic Signature(s) Signed: 10/25/2022 12:33:12 PM By: Kalman Shan DO Entered By: Kalman Shan on 10/25/2022  10:29:23 -------------------------------------------------------------------------------- HxROS Details Patient Name: Date of Service: Kristin Blend, PA Kristin Ward. 10/25/2022 8:00 [place] Record Number: [id number] Patient Account Number: [id number] Date of Birth/Sex: Treating RN: Jun 26, 1956 (67 y.o. Kristin Ward Primary Care Provider: Jill Ward Other Clinician: Referring Provider: Treating Provider/Extender: Venetia Constable in Treatment: 0 Eyes Complaints and Symptoms: Positive for: Glasses / Contacts Medical History: Past Medical History Notes: legally blind Integumentary (Skin) Complaints and Symptoms: Positive for: Wounds - right groin Hematologic/Lymphatic Medical History: Positive for: Anemia; Lymphedema Cardiovascular Medical History: Positive for: Coronary Artery Disease; Hypertension Past Medical History Notes: EF 15-20% Endocrine Medical History: Positive for: Type II Diabetes Time with diabetes: dx 2020 Oncologic Medical History: Positive for: Received Chemotherapy; Received Radiation Past Medical History Notes: 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 malignant neoplasm to left breast 2018 Immunizations Implantable Devices No devices added Hospitalization / Surgery History Type of Hospitalization/Surgery biopsy left breast Family and Social History Never smoker; Marital Status - Widowed; Alcohol Use: Never; Drug Use: No History; Caffeine Use: Never; Financial Concerns: No; Food, Clothing or Shelter Needs: No; Support System Lacking: No; Transportation Concerns: No SHINEKA, WANDS Ward ([id number]) [phone].pdf  Page 6 of 6 Electronic Signature(s) Signed: 10/25/2022 12:33:12 PM By: Kalman Shan DO Signed: 10/25/2022 5:52:30 PM By: Deon Pilling RN, BSN Entered By: Deon Pilling on 10/25/2022  08:36:18 -------------------------------------------------------------------------------- SuperBill Details Patient Name: Date of Service: Kristin Blend, PA Kristin Ward. 10/25/2022 Medical Record Number: [id number] Patient Account Number: [id number] Date of Birth/Sex: Treating RN: Nov 01, 1955 (67 y.o. Kristin Ward Primary Care Provider: Jill Ward Other Clinician: Referring Provider: Treating Provider/Extender: Venetia Constable in Treatment: 0 Diagnosis Coding ICD-10 Codes Code Description [id number] Malignant neoplasm of lower-outer quadrant of left female breast C79.51 Secondary malignant neoplasm of bone E11.622 Type 2 diabetes mellitus with other skin ulcer I89.0 Lymphedema, not elsewhere classified Facility Procedures : 7 CPT4 Code: [id number] Description: [id number] - WOUND CARE VISIT-LEV 5 EST PT Modifier: Quantity: 1 Physician Procedures : CPT4 Code Description Modifier [id number] - WC PHYS LEVEL 4 - NEW PT ICD-10 Diagnosis Description C50.512 Malignant neoplasm of lower-outer quadrant of left female breast C79.51 Secondary malignant neoplasm of bone E11.622 Type 2 diabetes mellitus  with other skin ulcer I89.0 Lymphedema, not elsewhere classified Quantity: 1 Electronic Signature(s) Signed: 10/25/2022 12:33:12 PM By: Kalman Shan DO Entered By: Kalman Shan on 10/25/2022 10:29:39

## 2022-10-26 NOTE — Progress Notes (Signed)
SHAELI, ANGELICA A ([id number]) [phone].pdf Page 1 of 4 Visit Report for 10/25/2022 Abuse Risk Screen Details Patient Name: Date of Service: Kristin Ward, [place] TRICIA A. 10/25/2022 8:00 A M Medical Record Number: [id number] Patient Account Number: [id number] Date of Birth/Sex: Treating RN: October 13, 1955 (67 y.o. Debby Bud Primary Care Jerrick Farve: Jill Alexanders Other Clinician: Referring Lariyah Shetterly: Treating Atreus Hasz/Extender: Venetia Constable in Treatment: 0 Abuse Risk Screen Items Answer ABUSE RISK SCREEN: Has anyone close to you tried to hurt or harm you recentlyo No Do you feel uncomfortable with anyone in your familyo No Has anyone forced you do things that you didnt want to doo No Electronic Signature(s) Signed: 10/25/2022 5:52:30 PM By: Deon Pilling RN, BSN Entered By: Deon Pilling on 10/25/2022 08:36:26 -------------------------------------------------------------------------------- Activities of Daily Living Details Patient Name: Date of Service: Kristin Ward, [place] TRICIA A. 10/25/2022 8:00 A M Medical Record Number: [id number] Patient Account Number: [id number] Date of Birth/Sex: Treating RN: 1955-10-20 (67 y.o. Helene Shoe, Tammi Klippel Primary Care Ethne Jeon: Jill Alexanders Other Clinician: Referring Amilliana Hayworth: Treating Yul Diana/Extender: Venetia Constable in Treatment: 0 Activities of Daily Living Items Answer Activities of Daily Living (Please select one for each item) Drive Automobile Not Able T Medications ake Completely Able Use T elephone Need Assistance Care for Appearance Need Assistance Use T oilet Need Assistance Bath / Shower Need Assistance Dress Self Completely Able Feed Self Completely Able Walk Need Assistance Get In / Out Bed Need Assistance Housework Need Assistance Prepare Meals Not Able Handle Money Need Assistance Shop for Self Completely Able Electronic Signature(s) Signed: 10/25/2022 5:52:30  PM By: Deon Pilling RN, BSN Entered By: Deon Pilling on 10/25/2022 08:37:02 -------------------------------------------------------------------------------- Education Screening Details Patient Name: Date of Service: Kristin Blend, PA TRICIA A. 10/25/2022 8:00 A M Medical Record Number: [id number] Patient Account Number: [id number] Date of Birth/Sex: Treating RN: [birth year] (67 y.o. Debby Bud Primary Care Oliva Montecalvo: Jill Alexanders Other Clinician: Referring Aarush Stukey: Treating Rikayla Demmon/Extender: Venetia Constable in Treatment: 0 LAURAANN, SHIBUYA A ([id number]) [id number] Page 2 of 4 Primary Learner Assessed: Patient Learning Preferences/Education Level/Primary Language Learning Preference: Explanation, Demonstration, Printed Material Highest Education Level: College or Above Preferred Language: Diplomatic Services operational officer Language Barrier: No Translator Needed: No Memory Deficit: No Emotional Barrier: No Cultural/Religious Beliefs Affecting Medical Care: No Physical Barrier Impaired Vision: Yes Glasses, Legally Blind Impaired Hearing: No Decreased Hand dexterity: No Knowledge/Comprehension Knowledge Level: High Comprehension Level: High Ability to understand written instructions: High Ability to understand verbal instructions: High Motivation Anxiety Level: Calm Cooperation: Cooperative Education Importance: Acknowledges Need Interest in Health Problems: Asks Questions Perception: Coherent Willingness to Engage in Self-Management High Activities: Readiness to Engage in Self-Management High Activities: Electronic Signature(s) Signed: 10/25/2022 5:52:30 PM By: Deon Pilling RN, BSN Entered By: Deon Pilling on 10/25/2022 08:37:52 -------------------------------------------------------------------------------- Fall Risk Assessment Details Patient Name: Date of Service: Kristin Blend, PA TRICIA A. 10/25/2022 8:00 A M Medical  Record Number: [id number] Patient Account Number: [id number] Date of Birth/Sex: Treating RN: [birth year] (67 y.o. Helene Shoe, Meta.Reding Primary Care Yakira Duquette: Jill Alexanders Other Clinician: Referring Sady Monaco: Treating Teyonna Plaisted/Extender: Venetia Constable in Treatment: 0 Fall Risk Assessment Items Have you had 2 or more falls in the last 12 monthso 0 Yes Have you had any fall that resulted in injury in the last 12 monthso 0 No FALLS RISK SCREEN History of falling - immediate or within 3 months 25 Yes Secondary diagnosis (Do you have 2 or more medical diagnoseso) 0 No Ambulatory  aid None/bed rest/wheelchair/nurse 0 Yes Crutches/cane/walker 15 Yes Furniture 0 No Intravenous therapy Access/Saline/Heparin Lock 0 No Gait/Transferring Normal/ bed rest/ wheelchair 0 No Weak (short steps with or without shuffle, stooped but able to lift head while walking, may seek 10 Yes support from furniture) Impaired (short steps with shuffle, may have difficulty arising from chair, head down, impaired 0 No balance) Mental Status Oriented to own ability 0 Yes Overestimates or forgets limitations 0 No Risk Level: Medium Risk Score: 50 Batterman, Takhia A ([id number]) [id number] Page 3 of 4 Electronic Signature(s) -------------------------------------------------------------------------------- Foot Assessment Details Patient Name: Date of Service: Kristin Ward, [place] TRICIA A. 10/25/2022 8:00 A M Medical Record Number: [id number] Patient Account Number: [id number] Date of Birth/Sex: Treating RN: October 25, 1955 (67 y.o. Debby Bud Primary Care Keil Pickering: Jill Alexanders Other Clinician: Referring Lyberti Thrush: Treating Royce Stegman/Extender: Venetia Constable in Treatment: 0 Foot Assessment Items Site Locations + = Sensation present, - = Sensation absent, C = Callus, U = Ulcer R = Redness, W = Warmth, M = Maceration, PU = Pre-ulcerative lesion F =  Fissure, S = Swelling, D = Dryness Assessment Right: Left: Other Deformity: No No Prior Foot Ulcer: No No Prior Amputation: No No Charcot Joint: No No Ambulatory Status: Gait: Notes no BLE wounds. Electronic Signature(s) Signed: 10/25/2022 5:52:30 PM By: Deon Pilling RN, BSN Entered By: Deon Pilling on 10/25/2022 08:38:33 -------------------------------------------------------------------------------- Nutrition Risk Screening Details Patient Name: Date of Service: Kristin Blend, PA TRICIA A. 10/25/2022 8:00 [place] Record Number: [id number] Patient Account Number: [id number] Date of Birth/Sex: Treating RN: [birth year] (67 y.o. Debby Bud Primary Care Thersea Manfredonia: Jill Alexanders Other Clinician: Referring Solace Manwarren: Treating Edrees Valent/Extender: Venetia Constable in Treatment: 0 Height (in): 67 Weight (lbs): Body Mass Index (BMI): RONIE, RACZKOWSKI A ([id number]) [phone] [id number] Page 4 of 4 Nutrition Risk Screening Items Score Screening NUTRITION RISK SCREEN: I have an illness or condition that made me change the kind and/or amount of food I eat 2 Yes I eat fewer than two meals per day 0 No I eat few fruits and vegetables, or milk products 0 No I have three or more drinks of beer, liquor or wine almost every day 0 No I have tooth or mouth problems that make it hard for me to eat 0 No I don't always have enough money to buy the food I need 0 No I eat alone most of the time 0 No I take three or more different prescribed or over-the-counter drugs a day 1 Yes Without wanting to, I have lost or gained 10 pounds in the last six months 0 No I am not always physically able to shop, cook and/or feed myself 0 No Nutrition Protocols Good Risk Protocol Provide education on elevated blood Moderate Risk Protocol 0 sugars and impact on wound healing, as applicable High Risk Proctocol Risk Level: Moderate Risk Score: 3 Electronic  Signature(s) Signed: 10/25/2022 5:52:30 PM By: Deon Pilling RN, BSN Entered By: Deon Pilling on 10/25/2022 [id number]

## 2022-10-26 NOTE — Progress Notes (Addendum)
[name], [name] ([id number]) [id number] Page 1 of 8 Visit Report for 10/25/2022 Allergy List Details Patient Name: Date of Service: Kristin Ward, [place] Kristin A. 10/25/2022 8:00 A M Medical Record Number: [id number] Patient Account Number: [id number] Date of Birth/Sex: Treating RN: 1955/09/02 (67 y.o. Kristin Ward Primary Care Spence Soberano: Sharlot Gowda Other Clinician: Referring Philopateer Strine: Treating Ileah Falkenstein/Extender: Christella Scheuermann in Treatment: 0 Allergies Active Allergies morphine latex codeine penicillin Allergy Notes Electronic Signature(s) Signed: 10/25/2022 5:52:30 PM By: Shawn Stall RN, BSN Entered By: Shawn Stall on 10/25/2022 08:30:10 -------------------------------------------------------------------------------- Arrival Information Details Patient Name: Date of Service: Kristin Clark, PA Kristin A. 10/25/2022 8:00 A M Medical Record Number: [id number] Patient Account Number: [id number] Date of Birth/Sex: Treating RN: Mar 14, 1956 (66 y.o. Kristin Ward Primary Care Ryun Velez: Sharlot Gowda Other Clinician: Referring Teruo Stilley: Treating Irish Piech/Extender: Christella Scheuermann in Treatment: 0 Visit Information Patient Arrived: Wheel Chair Arrival Time: 08:28 Accompanied By: self Transfer Assistance: Manual Patient Identification Verified: Yes Secondary Verification Process Completed: Yes Patient Requires Transmission-Based Precautions: No Patient Has Alerts: No Electronic Signature(s) Signed: 10/25/2022 5:52:30 PM By: Shawn Stall RN, BSN Entered By: Shawn Stall on 10/25/2022 08:29:11 -------------------------------------------------------------------------------- Clinic Level of Care Assessment Details Patient Name: Date of Service: Kristin Clark, PA Kristin A. 10/25/2022 8:00 A M Medical Record Number: [id number] Patient Account Number: [id number] Date of Birth/Sex: Treating RN: 1956/04/21 (67 y.o. Kristin Ward Primary Care Arcenio Mullaly: Sharlot Gowda Other Clinician: Referring Marco Raper: Treating Neita Landrigan/Extender: Christella Scheuermann in Treatment: 0 Clinic Level of Care Assessment Items TOOL 2 Quantity Score STARLENA, BEIL A ([id number]) [phone].pdf Page 2 of 8 X- 1 0 Use when only an EandM is performed on the INITIAL visit ASSESSMENTS - Nursing Assessment / Reassessment X- 1 20 General Physical Exam (combine w/ comprehensive assessment (listed just below) when performed on new pt. evals) X- 1 25 Comprehensive Assessment (HX, ROS, Risk Assessments, Wounds Hx, etc.) ASSESSMENTS - Wound and Skin A ssessment / Reassessment X - Simple Wound Assessment / Reassessment - one wound 1 5 []  - 0 Complex Wound Assessment / Reassessment - multiple wounds []  - 0 Dermatologic / Skin Assessment (not related to wound area) ASSESSMENTS - Ostomy and/or Continence Assessment and Care []  - 0 Incontinence Assessment and Management []  - 0 Ostomy Care Assessment and Management (repouching, etc.) PROCESS - Coordination of Care []  - 0 Simple Patient / Family Education for ongoing care X- 1 20 Complex (extensive) Patient / Family Education for ongoing care X- 1 10 Staff obtains Chiropractor, Records, T Results / Process Orders est X- 1 10 Staff telephones HHA, Nursing Homes / Clarify orders / etc []  - 0 Routine Transfer to another Facility (non-emergent condition) []  - 0 Routine Hospital Admission (non-emergent condition) X- 1 15 New Admissions / Manufacturing engineer / Ordering NPWT Apligraf, etc. , []  - 0 Emergency Hospital Admission (emergent condition) []  - 0 Simple Discharge Coordination X- 1 15 Complex (extensive) Discharge Coordination PROCESS - Special Needs []  - 0 Pediatric / Minor Patient Management []  - 0 Isolation Patient Management X- 1 15 Hearing / Language / Visual special needs []  - 0 Assessment of Community assistance  (transportation, D/C planning, etc.) []  - 0 Additional assistance / Altered mentation []  - 0 Support Surface(s) Assessment (bed, cushion, seat, etc.) INTERVENTIONS - Wound Cleansing / Measurement X- 1 5 Wound Imaging (photographs - any number of wounds) []  - 0 Wound Tracing (instead of photographs) X- 1 5 Simple Wound Measurement - one wound []  -  0 Complex Wound Measurement - multiple wounds X- 1 5 Simple Wound Cleansing - one wound  - 0 Complex Wound Cleansing - multiple wounds INTERVENTIONS - Wound Dressings X - Small Wound Dressing one or multiple wounds 1 10  - 0 Medium Wound Dressing one or multiple wounds  - 0 Large Wound Dressing one or multiple wounds  - 0 Application of Medications - injection INTERVENTIONS - Miscellaneous  - 0 External ear exam  - 0 Specimen Collection (cultures, biopsies, blood, body fluids, etc.)  - 0 Specimen(s) / Culture(s) sent or taken to Lab for analysis MARIE, BOROWSKI A ([id number]) [phone].pdf Page 3 of 8  - 0 Patient Transfer (multiple staff / Nurse, adult / Similar devices)  - 0 Simple Staple / Suture removal (25 or less)  - 0 Complex Staple / Suture removal (26 or more)  - 0 Hypo / Hyperglycemic Management (close monitor of Blood Glucose)  - 0 Ankle / Brachial Index (ABI) - do not check if billed separately Has the patient been seen at the hospital within the last three years: Yes Total Score: 160 Level Of Care: New/Established - Level 5 Electronic Signature(s) Signed: 10/25/2022 4:54:40 PM By: Karie Schwalbe RN Entered By: Karie Schwalbe on 10/25/2022 09:59:52 -------------------------------------------------------------------------------- Encounter Discharge Information Details Patient Name: Date of Service: Kristin Clark, PA Kristin A. 10/25/2022 8:00 A M Medical Record Number: [id number] Patient Account Number: [id number] Date of Birth/Sex: Treating RN: 28-Feb-1956 (67 y.o. Kristin Ward Primary Care Teneil Shiller: Sharlot Gowda Other Clinician: Referring Eisley Barber: Treating Mylez Venable/Extender: Christella Scheuermann in Treatment: 0 Encounter Discharge Information Items Discharge Condition: Stable Ambulatory Status: Walker Discharge Destination: Home Transportation: Private Auto Accompanied By: no-one Schedule Follow-up Appointment: Yes Clinical Summary of Care: Patient Declined Notes Pt. requested to go to [facility] E[facility] Electronic Signature(s) Signed: 10/25/2022 4:54:40 PM By: Karie Schwalbe RN Entered By: Karie Schwalbe on 10/25/2022 10:01:23 -------------------------------------------------------------------------------- Lower Extremity Assessment Details Patient Name: Date of Service: Kristin Clark, PA Kristin A. 10/25/2022 8:00 A M Medical Record Number: [id number] Patient Account Number: [id number] Date of Birth/Sex: Treating RN: Mar 20, 1956 (67 y.o. Kristin Ward Primary Care Ragnar Waas: Sharlot Gowda Other Clinician: Referring Jhovani Griswold: Treating Keenan Dimitrov/Extender: Christella Scheuermann in Treatment: 0 Electronic Signature(s) Signed: 10/25/2022 5:52:30 PM By: Shawn Stall RN, BSN Entered By: Shawn Stall on 10/25/2022 08:38:37 -------------------------------------------------------------------------------- Multi Wound Chart Details Patient Name: Date of Service: Kristin Clark, PA Kristin A. 10/25/2022 8:00 A M Medical Record Number: [id number] Patient Account Number: [id number] Date of Birth/Sex: Treating RN: 1956-01-03 (67 y.o. F) Primary Care Vielka Klinedinst: Sharlot Gowda Other Clinician: Referring Cleotha Tsang: Treating Cisco Kindt/Extender: Christella Scheuermann in Treatment: 0 LEASIA, SWANN A ([id number]) [id number] Page 4 of 8 Vital Signs Height(in): 67 Pulse(bpm): 95 Weight(lbs): Blood Pressure(mmHg): 82/50 Body Mass Index(BMI): Temperature(F): 98.4 Respiratory  Rate(breaths/min): 20 [1:Photos:] [N/A:N/A] Right Groin N/A N/A Wound Location: Gradually Appeared N/A N/A Wounding Event: Malignant Wound N/A N/A Primary Etiology: Lymphedema N/A N/A Secondary Etiology: Anemia, Lymphedema, Coronary N/A N/A Comorbid History: Artery Disease, Hypertension, Type II Diabetes, Received Chemotherapy, Received Radiation 10/06/2022 N/A N/A Date Acquired: 0 N/A N/A Weeks of Treatment: Open N/A N/A Wound Status: No N/A N/A Wound Recurrence: 6.4x20x1.5 N/A N/A Measurements L x W x D (cm) 100.531 N/A N/A A (cm) : rea 150.796 N/A N/A Volume (cm) : Unclassifiable N/A N/A Classification: Large N/A N/A Exudate A mount: Purulent N/A N/A Exudate Type: yellow, brown, green N/A N/A Exudate Color: Yes N/A N/A Foul Odor A Cleansing:  fter No N/A N/A Odor A nticipated Due to Product Use: Distinct, outline attached N/A N/A Wound Margin: None Present (0%) N/A N/A Granulation A mount: Large (67-100%) N/A N/A Necrotic A mount: Fascia: No N/A N/A Exposed Structures: Fat Layer (Subcutaneous Tissue): No Tendon: No Muscle: No Joint: No Bone: No None N/A N/A Epithelialization: Excoriation: No N/A N/A Periwound Skin Texture: Induration: No Callus: No Crepitus: No Rash: No Scarring: No Maceration: No N/A N/A Periwound Skin Moisture: Dry/Scaly: No Atrophie Blanche: No N/A N/A Periwound Skin Color: Cyanosis: No Ecchymosis: No Erythema: No Hemosiderin Staining: No Mottled: No Pallor: No Rubor: No Treatment Notes Wound #1 (Groin) Wound Laterality: Right Cleanser Peri-Wound Care Topical Primary Dressing Dakin's Solution 0.25%, 16 (oz) Discharge Instruction: Moisten gauze with Dakin's solution VASHE (or Dakin's) Discharge Instruction: using wet to dry gauze Cheaney, Kayci A ([id number]) [id number] Page 5 of 8 Secondary Dressing Woven Gauze Sponge, Non-Sterile 4x4 in Discharge Instruction: moisten Vashe or  Dakin's then place in wound at Right groin. Secured With Compression Wrap Compression Stockings Facilities manager) Signed: 10/25/2022 12:33:12 PM By: Geralyn Corwin DO Entered By: Geralyn Corwin on 10/25/2022 10:16:54 -------------------------------------------------------------------------------- Multi-Disciplinary Care Plan Details Patient Name: Date of Service: Kristin Clark, PA Kristin A. 10/25/2022 8:00 A M Medical Record Number: [id number] Patient Account Number: [id number] Date of Birth/Sex: Treating RN: 1955/10/12 (67 y.o. Kristin Ward Primary Care Deondre Marinaro: Sharlot Gowda Other Clinician: Referring Kyona Chauncey: Treating Hailea Eaglin/Extender: Christella Scheuermann in Treatment: 0 Active Inactive Electronic Signature(s) Signed: 12/03/2022 11:06:39 AM By: Shawn Stall RN, BSN Previous Signature: 10/25/2022 5:52:30 PM Version By: Shawn Stall RN, BSN Entered By: Shawn Stall on 12/03/2022 11:06:39 -------------------------------------------------------------------------------- Pain Assessment Details Patient Name: Date of Service: Kristin Clark, PA Kristin A. 10/25/2022 8:00 A M Medical Record Number: [id number] Patient Account Number: [id number] Date of Birth/Sex: Treating RN: 31-May-1956 (67 y.o. Kristin Ward Primary Care Malayla Granberry: Sharlot Gowda Other Clinician: Referring Kealey Kemmer: Treating Zari Cly/Extender: Christella Scheuermann in Treatment: 0 Active Problems Location of Pain Severity and Description of Pain Patient Has Paino Yes Site Locations Pain Location: Pain in Ulcers Rate the pain. Current Pain Level: 10 Meester, Abbagayle A ([id number]) [phone].pdf Page 6 of 8 Pain Management and Medication Current Pain Management: Medication: No Cold Application: No Rest: No Massage: No Activity: No T.E.N.S.: No Heat Application: No Leg drop or elevation: No Is the Current Pain Management Adequate:  Adequate How does your wound impact your activities of daily livingo Sleep: No Bathing: No Appetite: No Relationship With Others: No Bladder Continence: No Emotions: No Bowel Continence: No Work: No Toileting: No Drive: No Dressing: No Hobbies: No Psychologist, prison and probation services) Signed: 10/25/2022 5:52:30 PM By: Shawn Stall RN, BSN Entered By: Shawn Stall on 10/25/2022 08:42:24 -------------------------------------------------------------------------------- Patient/Caregiver Education Details Patient Name: Date of Service: Kristin Clark, PA Kristin Mervyn Skeeters 3/15/2024andnbsp8:00 A M Medical Record Number: [id number] Patient Account Number: [id number] Date of Birth/Gender: Treating RN: [birth year] (67 y.o. Kristin Ward Primary Care Physician: Sharlot Gowda Other Clinician: Referring Physician: Treating Physician/Extender: Christella Scheuermann in Treatment: 0 Education Assessment Education Provided To: Patient Education Topics Provided Wound/Skin Impairment: Handouts: Caring for Your Ulcer Methods: Explain/Verbal Responses: Reinforcements needed Electronic Signature(s) Signed: 10/25/2022 5:52:30 PM By: Shawn Stall RN, BSN Entered By: Shawn Stall on 10/25/2022 09:11:57 -------------------------------------------------------------------------------- Wound Assessment Details Patient Name: Date of Service: Kristin Clark, PA Kristin A. 10/25/2022 8:00 A M Medical Record Number: [id number] Patient Account Number: [id number] Date of Birth/Sex: Treating RN: 19-Feb-1956 (67 y.o. 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 Secondary Lymphedema Wounding Event: Gradually Appeared Etiology: Date Acquired: 10/06/2022 Wound Open Weeks Of Treatment:  0 Status: Clustered Wound: No Comorbid Anemia, Lymphedema, Coronary Artery Disease, Hypertension, History: Type II Diabetes, Received Chemotherapy, Received Radiation Photos KAMRYN, MESSINEO A ([id number]) [phone].pdf Page 7 of 8 Wound Measurements Length: (cm) 6.4 Width: (cm) 20 Depth: (cm) 1.5 Area: (cm) 100.531 Volume: (cm) 150.796 % Reduction in Area: % Reduction in Volume: Epithelialization: None Tunneling: No Undermining: No Wound Description Classification: Unclassifiable Wound Margin: Distinct, outline attached Exudate Amount: Large Exudate Type: Purulent Exudate Color: yellow, brown, green Foul Odor After Cleansing: Yes Due to Product Use: No Slough/Fibrino Yes Wound Bed Granulation Amount: None Present (0%) Exposed Structure Necrotic Amount: Large (67-100%) Fascia Exposed: No Necrotic Quality: Adherent Slough Fat Layer (Subcutaneous Tissue) Exposed: No Tendon Exposed: No Muscle Exposed: No Joint Exposed: No Bone Exposed: No Periwound Skin Texture Texture Color No Abnormalities Noted: No No Abnormalities Noted: No Callus: No Atrophie Blanche: No Crepitus: No Cyanosis: No Excoriation: No Ecchymosis: No Induration: No Erythema: No Rash: No Hemosiderin Staining: No Scarring: No Mottled: No Pallor: No Moisture Rubor: No No Abnormalities Noted: No Dry / Scaly: No Maceration: No Electronic Signature(s) Signed: 10/25/2022 5:52:30 PM By: Shawn Stall RN, BSN Entered By: Shawn Stall on 10/25/2022 08:46:06 -------------------------------------------------------------------------------- Vitals Details Patient Name: Date of Service: Kristin Clark, PA Kristin A. 10/25/2022 8:00 A M Medical Record Number: [id number] Patient Account Number: [id number] Date of Birth/Sex: Treating RN: 11/04/1955 (67 y.o. Kristin Ward Primary Care Taeko Schaffer: Sharlot Gowda Other Clinician: Referring Ivy Puryear: Treating Laparis Durrett/Extender: Christella Scheuermann in Treatment: 0 Vital Signs Time Taken: 08:29 Temperature (F): 98.4 Height (in): 67 Pulse (bpm): 95 Source: Stated Respiratory Rate (breaths/min): 20 Source: Stated Blood Pressure (mmHg): 82/50 Reference Range: 80 - 120 mg / dl Schmierer, Shunna A ([id number]) [phone].pdf Page 8 of 8 Electronic Signature(s) Signed: 10/25/2022 5:52:30 PM By: Shawn Stall RN, BSN Entered By: Shawn Stall on 10/25/2022 22:02:54

## 2022-10-30 ENCOUNTER — Other Ambulatory Visit: Payer: 59

## 2022-10-31 ENCOUNTER — Ambulatory Visit: Payer: 59 | Admitting: Hematology

## 2022-10-31 ENCOUNTER — Other Ambulatory Visit: Payer: 59

## 2022-10-31 ENCOUNTER — Ambulatory Visit: Payer: 59

## 2022-11-05 ENCOUNTER — Telehealth: Payer: Self-pay

## 2022-11-05 NOTE — Telephone Encounter (Signed)
Faxed back signed orders to [facility] as per Cira Rue NP. Received fax confirmation from sender. Placed original in to be scanned into patients chart.

## 2022-11-06 ENCOUNTER — Telehealth: Payer: Self-pay

## 2022-11-06 NOTE — Telephone Encounter (Signed)
Pt called in about a concern. Pt stated that she had more than one lump in her vagina and and she noted it last week, Pt stated it is not an open wound and she is not in any pain. I was able to give the information to Palliative Care Nurse  Lexine Baton NP., and she will follow up with the pt.   Betha Loa, CMA

## 2022-11-21 ENCOUNTER — Telehealth: Payer: Self-pay

## 2022-11-21 NOTE — Telephone Encounter (Signed)
Faxed signed orders back to [facility] as per Dr. Mosetta Putt. Received fax confirmation of receipt. Placed original order in To Be Scanned

## 2022-11-25 ENCOUNTER — Ambulatory Visit (HOSPITAL_BASED_OUTPATIENT_CLINIC_OR_DEPARTMENT_OTHER): Payer: 59 | Admitting: Internal Medicine

## 2022-12-19 ENCOUNTER — Telehealth: Payer: Self-pay

## 2022-12-19 NOTE — Telephone Encounter (Signed)
Faxed signed orders back to [facility] as per Dr. Mosetta Putt. Received fax confirmation of receipt. Placed original in to be scanned file.

## 2023-01-11 DEATH — deceased

## 2023-03-25 ENCOUNTER — Ambulatory Visit: Payer: Self-pay | Admitting: Cardiology

## 2024-02-25 ENCOUNTER — Other Ambulatory Visit (HOSPITAL_COMMUNITY): Payer: Self-pay
